# Patient Record
Sex: Female | Born: 1945
Health system: Southern US, Community
[De-identification: ages and names within clinical notes are randomized; demographics above are authoritative.]

## PROBLEM LIST (undated history)

## (undated) DIAGNOSIS — R51 Headache: Secondary | ICD-10-CM

## (undated) DIAGNOSIS — R011 Cardiac murmur, unspecified: Secondary | ICD-10-CM

## (undated) DIAGNOSIS — D649 Anemia, unspecified: Secondary | ICD-10-CM

## (undated) DIAGNOSIS — I2510 Atherosclerotic heart disease of native coronary artery without angina pectoris: Secondary | ICD-10-CM

## (undated) DIAGNOSIS — K900 Celiac disease: Secondary | ICD-10-CM

## (undated) DIAGNOSIS — K219 Gastro-esophageal reflux disease without esophagitis: Secondary | ICD-10-CM

## (undated) DIAGNOSIS — R0600 Dyspnea, unspecified: Secondary | ICD-10-CM

## (undated) DIAGNOSIS — G2581 Restless legs syndrome: Secondary | ICD-10-CM

## (undated) DIAGNOSIS — Z1371 Encounter for nonprocreative screening for genetic disease carrier status: Secondary | ICD-10-CM

## (undated) DIAGNOSIS — M1990 Unspecified osteoarthritis, unspecified site: Secondary | ICD-10-CM

## (undated) DIAGNOSIS — Q208 Other congenital malformations of cardiac chambers and connections: Secondary | ICD-10-CM

## (undated) DIAGNOSIS — I7300 Raynaud's syndrome without gangrene: Secondary | ICD-10-CM

## (undated) DIAGNOSIS — F419 Anxiety disorder, unspecified: Secondary | ICD-10-CM

## (undated) DIAGNOSIS — F32A Depression, unspecified: Secondary | ICD-10-CM

## (undated) DIAGNOSIS — E039 Hypothyroidism, unspecified: Secondary | ICD-10-CM

## (undated) DIAGNOSIS — R7303 Prediabetes: Secondary | ICD-10-CM

## (undated) DIAGNOSIS — R918 Other nonspecific abnormal finding of lung field: Secondary | ICD-10-CM

## (undated) DIAGNOSIS — I10 Essential (primary) hypertension: Secondary | ICD-10-CM

## (undated) DIAGNOSIS — I480 Paroxysmal atrial fibrillation: Secondary | ICD-10-CM

## (undated) DIAGNOSIS — F329 Major depressive disorder, single episode, unspecified: Secondary | ICD-10-CM

## (undated) DIAGNOSIS — M810 Age-related osteoporosis without current pathological fracture: Secondary | ICD-10-CM

## (undated) DIAGNOSIS — I5189 Other ill-defined heart diseases: Secondary | ICD-10-CM

## (undated) DIAGNOSIS — I5030 Unspecified diastolic (congestive) heart failure: Secondary | ICD-10-CM

## (undated) DIAGNOSIS — H409 Unspecified glaucoma: Secondary | ICD-10-CM

## (undated) DIAGNOSIS — I509 Heart failure, unspecified: Secondary | ICD-10-CM

## (undated) DIAGNOSIS — R519 Headache, unspecified: Secondary | ICD-10-CM

## (undated) DIAGNOSIS — J189 Pneumonia, unspecified organism: Secondary | ICD-10-CM

## (undated) DIAGNOSIS — K760 Fatty (change of) liver, not elsewhere classified: Secondary | ICD-10-CM

## (undated) DIAGNOSIS — Z974 Presence of external hearing-aid: Secondary | ICD-10-CM

## (undated) DIAGNOSIS — I639 Cerebral infarction, unspecified: Secondary | ICD-10-CM

## (undated) DIAGNOSIS — C50919 Malignant neoplasm of unspecified site of unspecified female breast: Secondary | ICD-10-CM

## (undated) HISTORY — PX: HAND SURGERY: SHX662

## (undated) HISTORY — DX: Paroxysmal atrial fibrillation: I48.0

## (undated) HISTORY — DX: Unspecified glaucoma: H40.9

## (undated) HISTORY — DX: Unspecified diastolic (congestive) heart failure: I50.30

## (undated) HISTORY — DX: Atherosclerotic heart disease of native coronary artery without angina pectoris: I25.10

## (undated) HISTORY — PX: APPENDECTOMY: SHX54

## (undated) HISTORY — DX: Cerebral infarction, unspecified: I63.9

## (undated) HISTORY — PX: ABDOMINAL HYSTERECTOMY: SHX81

## (undated) HISTORY — DX: Other nonspecific abnormal finding of lung field: R91.8

## (undated) HISTORY — PX: TONSILLECTOMY: SUR1361

## (undated) HISTORY — PX: CATARACT EXTRACTION: SUR2

## (undated) HISTORY — DX: Encounter for nonprocreative screening for genetic disease carrier status: Z13.71

## (undated) HISTORY — DX: Other ill-defined heart diseases: I51.89

## (undated) HISTORY — DX: Malignant neoplasm of unspecified site of unspecified female breast: C50.919

## (undated) HISTORY — PX: EYE SURGERY: SHX253

## (undated) HISTORY — DX: Celiac disease: K90.0

---

## 2004-06-16 ENCOUNTER — Ambulatory Visit: Payer: Self-pay | Admitting: Family Medicine

## 2004-07-27 ENCOUNTER — Ambulatory Visit: Payer: Self-pay | Admitting: Family Medicine

## 2004-08-16 ENCOUNTER — Ambulatory Visit: Payer: Self-pay | Admitting: Family Medicine

## 2006-01-12 ENCOUNTER — Ambulatory Visit: Payer: Self-pay | Admitting: Unknown Physician Specialty

## 2006-01-12 HISTORY — PX: UPPER GI ENDOSCOPY: SHX6162

## 2006-01-25 ENCOUNTER — Ambulatory Visit: Payer: Self-pay | Admitting: Family Medicine

## 2008-04-07 ENCOUNTER — Ambulatory Visit: Payer: Self-pay | Admitting: Family Medicine

## 2010-04-12 ENCOUNTER — Ambulatory Visit: Payer: Self-pay | Admitting: Family Medicine

## 2010-05-10 ENCOUNTER — Ambulatory Visit: Payer: Self-pay | Admitting: Family Medicine

## 2010-05-27 ENCOUNTER — Ambulatory Visit: Payer: Self-pay | Admitting: Family Medicine

## 2010-09-27 ENCOUNTER — Emergency Department: Payer: Self-pay | Admitting: Emergency Medicine

## 2010-10-26 ENCOUNTER — Ambulatory Visit: Payer: Self-pay

## 2011-05-10 ENCOUNTER — Ambulatory Visit: Payer: Self-pay | Admitting: Family Medicine

## 2011-12-01 ENCOUNTER — Ambulatory Visit: Payer: Self-pay | Admitting: Family Medicine

## 2011-12-14 ENCOUNTER — Ambulatory Visit: Payer: Self-pay | Admitting: Family Medicine

## 2011-12-14 LAB — HM PAP SMEAR: HM Pap smear: NORMAL

## 2012-04-16 ENCOUNTER — Ambulatory Visit: Payer: Self-pay | Admitting: Family Medicine

## 2012-04-22 ENCOUNTER — Ambulatory Visit: Payer: Self-pay | Admitting: Unknown Physician Specialty

## 2012-04-22 LAB — HM COLONOSCOPY

## 2012-04-23 LAB — PATHOLOGY REPORT

## 2012-06-07 ENCOUNTER — Ambulatory Visit: Payer: Self-pay | Admitting: Family Medicine

## 2012-07-08 ENCOUNTER — Ambulatory Visit: Payer: Self-pay

## 2012-07-27 ENCOUNTER — Ambulatory Visit: Payer: Self-pay

## 2012-10-14 ENCOUNTER — Ambulatory Visit: Payer: Self-pay | Admitting: Family Medicine

## 2012-10-14 LAB — HM DEXA SCAN

## 2012-12-18 ENCOUNTER — Ambulatory Visit: Payer: Self-pay | Admitting: Ophthalmology

## 2013-01-01 ENCOUNTER — Ambulatory Visit: Payer: Self-pay | Admitting: Ophthalmology

## 2013-02-05 ENCOUNTER — Ambulatory Visit: Payer: Self-pay | Admitting: Ophthalmology

## 2013-04-07 ENCOUNTER — Ambulatory Visit: Payer: Self-pay | Admitting: Family Medicine

## 2014-02-05 LAB — BASIC METABOLIC PANEL
BUN: 10 mg/dL (ref 4–21)
Creatinine: 0.6 mg/dL (ref 0.5–1.1)
Glucose: 111 mg/dL
Potassium: 4 mmol/L (ref 3.4–5.3)
Sodium: 142 mmol/L (ref 137–147)

## 2014-02-05 LAB — HEPATIC FUNCTION PANEL
ALT: 25 U/L (ref 7–35)
AST: 19 U/L (ref 13–35)
Alkaline Phosphatase: 58 U/L (ref 25–125)

## 2014-02-05 LAB — CBC AND DIFFERENTIAL
HCT: 41 % (ref 36–46)
Hemoglobin: 13.5 g/dL (ref 12.0–16.0)
Neutrophils Absolute: 3 /uL
Platelets: 301 10*3/uL (ref 150–399)
WBC: 6 10^3/mL

## 2014-02-05 LAB — TSH: TSH: 3.29 u[IU]/mL (ref 0.41–5.90)

## 2014-02-05 LAB — HEMOGLOBIN A1C: Hgb A1c MFr Bld: 5.6 % (ref 4.0–6.0)

## 2014-04-07 LAB — LIPID PANEL
Cholesterol: 201 mg/dL — AB (ref 0–200)
HDL: 46 mg/dL (ref 35–70)
LDL Cholesterol: 117 mg/dL
Triglycerides: 189 mg/dL — AB (ref 40–160)

## 2014-04-21 ENCOUNTER — Ambulatory Visit: Payer: Self-pay | Admitting: Family Medicine

## 2014-07-21 DIAGNOSIS — H35371 Puckering of macula, right eye: Secondary | ICD-10-CM | POA: Diagnosis not present

## 2014-07-21 DIAGNOSIS — H40003 Preglaucoma, unspecified, bilateral: Secondary | ICD-10-CM | POA: Diagnosis not present

## 2014-08-18 DIAGNOSIS — H35371 Puckering of macula, right eye: Secondary | ICD-10-CM | POA: Diagnosis not present

## 2014-09-07 ENCOUNTER — Ambulatory Visit
Admit: 2014-09-07 | Disposition: A | Discharge status: Home / Self Care | Payer: Self-pay | Attending: Ophthalmology | Admitting: Ophthalmology

## 2014-09-07 DIAGNOSIS — H35371 Puckering of macula, right eye: Secondary | ICD-10-CM | POA: Diagnosis not present

## 2014-09-07 DIAGNOSIS — Z01810 Encounter for preprocedural cardiovascular examination: Secondary | ICD-10-CM | POA: Diagnosis not present

## 2014-09-16 ENCOUNTER — Ambulatory Visit
Admit: 2014-09-16 | Disposition: A | Discharge status: Home / Self Care | Payer: Self-pay | Attending: Ophthalmology | Admitting: Ophthalmology

## 2014-09-16 DIAGNOSIS — H43311 Vitreous membranes and strands, right eye: Secondary | ICD-10-CM | POA: Diagnosis not present

## 2014-09-16 DIAGNOSIS — I7300 Raynaud's syndrome without gangrene: Secondary | ICD-10-CM | POA: Diagnosis not present

## 2014-09-16 DIAGNOSIS — M810 Age-related osteoporosis without current pathological fracture: Secondary | ICD-10-CM | POA: Diagnosis not present

## 2014-09-16 DIAGNOSIS — E039 Hypothyroidism, unspecified: Secondary | ICD-10-CM | POA: Diagnosis not present

## 2014-09-16 DIAGNOSIS — H9190 Unspecified hearing loss, unspecified ear: Secondary | ICD-10-CM | POA: Diagnosis not present

## 2014-09-16 DIAGNOSIS — Z79899 Other long term (current) drug therapy: Secondary | ICD-10-CM | POA: Diagnosis not present

## 2014-09-16 DIAGNOSIS — H35371 Puckering of macula, right eye: Secondary | ICD-10-CM | POA: Diagnosis not present

## 2014-09-16 DIAGNOSIS — G2581 Restless legs syndrome: Secondary | ICD-10-CM | POA: Diagnosis not present

## 2014-09-18 NOTE — Op Note (Signed)
PATIENT NAME:  Sandra [name], Sandra [name] MR#:  [id number] DATE OF BIRTH:  10/15/1945  DATE OF PROCEDURE:  02/05/2013  PREOPERATIVE DIAGNOSIS:  Cataract of the right eye.  POSTOPERATIVE DIAGNOSIS:  Cataract of the right eye.  PROCEDURE:  Phacoemulsification with posterior chamber intraocular lens implantation of the right eye.  LENS:  ZCB00 18.5-diopter posterior chamber intraocular lens.  ULTRASOUND TIME:  9% of 43 seconds.  CDE 4.0.   SURGEON:  Mali Tyeson Tanimoto, MD  ANESTHESIA:  Retrobulbar block of Xylocaine and Bupivacaine.  COMPLICATIONS:  None.  DESCRIPTION OF PROCEDURE:  The patient was identified in the holding room and transported to the operating room and placed in the supine position under the operating microscope.  The right eye was identified as the operative eye and a retrobulbar block was performed under intravenous sedation.  It was then prepped and draped in the usual sterile ophthalmic fashion.  A 1 millimeter clear-corneal paracentesis was made at the 1:30 position.  The anterior chamber was filled with Viscoat viscoelastic.  A 2.4 millimeter keratome was used to make a near-clear corneal incision at the 10:30 position.  A curvilinear capsulorrhexis was made with a cystotome and capsulorrhexis forceps.  Balanced salt solution was used to hydrodissect and hydrodelineate the nucleus.  Phacoemulsification was then used in stop and chop fashion to remove the lens nucleus and epinucleus.  The remaining cortex was then removed using the irrigation and aspiration handpiece.  Provisc was then placed into the capsular bag to distend it for lens placement.  A ZCB00 18.5-diopter lens was then injected into the capsular bag.  The remaining viscoelastic was aspirated.  Wounds were hydrated with balanced salt solution.  The anterior chamber was inflated to a physiologic pressure with balanced salt solution. 0.1 mL of cefuroxime 10 mg/mL were injected into the anterior chamber for a dose of 1 mg of  intracameral antibiotic at the completion of the case.  Mio stat was placed into the anterior chamber to constrict the pupil.  No wound leaks were noted.  Topical Vigamox drops and Maxitrol ointment were applied to the eye.  The eye was patched.  The patient was taken to the recovery room in stable condition without complications of anesthesia or surgery.  ____________________________ Wyonia Hough, MD crb:aw D: 02/05/2013 14:18:42 ET T: 02/05/2013 14:37:01 ET JOB#: [id number]  cc: Wyonia Hough, MD, <Dictator> Leandrew Koyanagi MD ELECTRONICALLY SIGNED 02/12/2013 10:58

## 2014-09-18 NOTE — Op Note (Signed)
PATIENT NAME:  Sandra [name], Sandra [name] MR#:  [id number] DATE OF BIRTH:  1946/04/22  DATE OF PROCEDURE:  01/01/2013  PREOPERATIVE DIAGNOSIS:  Cataract of the left eye.  POSTOPERATIVE DIAGNOSIS:  Cataract of the left eye.  PROCEDURE:  Phacoemulsification with posterior chamber intraocular lens implantation of the left eye.  LENS: ZCB00 18.5-diopter posterior chamber intraocular lens.  ULTRASOUND TIME:  11% of 44 seconds.  CDE 5.0.   SURGEON:  Mali Madgeline Rayo, MD  ANESTHESIA:  Retrobulbar block of Xylocaine and Bupivacaine.  COMPLICATIONS:  None.  DESCRIPTION OF PROCEDURE:  The patient was identified in the holding room and transported to the operating room and placed in the supine position under the operating microscope.  The left eye was identified as the operative eye and a retrobulbar block was performed under intravenous sedation.  It was then prepped and draped in the usual sterile ophthalmic fashion.  A 1 millimeter clear-corneal paracentesis was made at the 1:30 position.  The anterior chamber was filled with Viscoat viscoelastic.  A 2.4 millimeter keratome was used to make a near-clear corneal incision at the 10:30 position.  A curvilinear capsulorrhexis was made with a cystotome and capsulorrhexis forceps.  Balanced salt solution was used to hydrodissect and hydrodelineate the nucleus.  Phacoemulsification was then used in stop and chop fashion to remove the lens nucleus and epinucleus.  The remaining cortex was then removed using the irrigation and aspiration handpiece.  Provisc was then placed into the capsular bag to distend it for lens placement.  A ZCB00 18.5-diopter lens was then injected into the capsular bag.  The remaining viscoelastic was aspirated.  Wounds were hydrated with balanced salt solution.  The anterior chamber was inflated to a physiologic pressure with balanced salt solution. 0.1 mL of cefuroxime 10 mg/mL were injected into the anterior chamber for a dose of 1 mg of  intracameral antibiotic at the completion of the case.  Mio stat was placed into the anterior chamber to constrict the pupil.  No wound leaks were noted.  Topical Vigamox drops and Maxitrol ointment were applied to the eye.  The eye was patched.  The patient was taken to the recovery room in stable condition without complications of anesthesia or surgery.  ____________________________ Wyonia Hough, MD crb:aw D: 01/01/2013 12:06:00 ET T: 01/01/2013 12:13:19 ET JOB#: [id number]  cc: Wyonia Hough, MD, <Dictator> Leandrew Koyanagi MD ELECTRONICALLY SIGNED 01/15/2013 13:30

## 2014-09-27 NOTE — Op Note (Signed)
PATIENT NAME:  Sandra [name], Sandra [name] MR#:  [id number] DATE OF BIRTH:  02-13-1946  DATE OF PROCEDURE:  09/16/2014  DATE OF BIRTH: 1946-02-08.   PREPROCEDURE DIAGNOSIS: Epiretinal membrane.   POSTPROCEDURE DIAGNOSIS: Epiretinal membrane.    PROCEDURE: A 25-gauge pars plana vitrectomy with ICG membrane peeling, epiretinal membrane, and ILM peeling in the right eye.   ANESTHESIA: MAC with retrobulbar block.   COMPLICATIONS: None.   ESTIMATED BLOOD LOSS: Minimal.   SPECIMENS:   None.   PROCEDURE IN DETAIL:   The patient was evaluated in the clinic for an epiretinal membrane noticeably significant in the right eye. Risks, benefits, alternatives, and complications were discussed with the patient. The patient elected to proceed with pars plana vitrectomy with membrane peeling. On the day of surgery, the patient was greeted in the preoperative holding area. Any consents were reviewed. The right eye was marked. The patient was then brought into the operating room in supine position. Monitored anesthesia care was administered and 4 mL  of a retrobulbar block consisting of lidocaine plain, Marcaine plain, and Wydase was injected. The right eye was then prepped and draped in the usual sterile fashion. Three 25 gauge trocars were placed in the usual position. The infusion cannula was checked to make sure it was in the vitreous cavity before starting the infusion. A core vitrectomy was performed. The patient did indeed have a posterior vitreous detachment previously and so 360 degree trimming of the vitreous was performed to the periphery. Attention was then drawn to the macula. ICG was used to stain the macular surface.  ILM forceps and a macular flat lens were used to peel the epiretinal membrane and internal limiting membrane off of the fovea for an area of  1 disc diameter around the fovea. Attention was then drawn to the periphery; 360 scleral depressed examination was performed and there were no retinal breaks or  tears noted. The trocars were then removed. The superotemporal trocar was sutured with 7-0 Vicryl. The subconjunctival cefuroxime and dexamethasone was injected. The patient was then patched and shielded with Neo-Poly-Dex ointment and taken to the recovery area in stable condition.    ____________________________ Laban Emperor Oval Linsey, MD jdr:tr D: 09/16/2014 08:23:42 ET T: 09/16/2014 12:17:33 ET JOB#: [id number]  cc: Janett Billow D. Oval Linsey, MD, <Dictator> Alexia Freestone MD ELECTRONICALLY SIGNED 09/23/2014 10:17

## 2014-11-06 DIAGNOSIS — H35371 Puckering of macula, right eye: Secondary | ICD-10-CM | POA: Diagnosis not present

## 2014-12-14 ENCOUNTER — Other Ambulatory Visit: Payer: Self-pay

## 2014-12-14 NOTE — Telephone Encounter (Signed)
Fax from [facility]. Please review.=aa

## 2014-12-17 MED ORDER — ALPRAZOLAM 0.5 MG PO TABS: 0.5000 mg | ORAL_TABLET | Freq: Every evening | ORAL | Status: DC | PRN

## 2014-12-18 ENCOUNTER — Other Ambulatory Visit: Payer: Self-pay

## 2014-12-18 NOTE — Telephone Encounter (Signed)
Fax from [facility] for [facility] down, her last OV with [place] was in November 2015 and no appts have been scheduled. Please review. Thank you-aa

## 2014-12-18 NOTE — Telephone Encounter (Signed)
See other message-aa 

## 2014-12-21 ENCOUNTER — Other Ambulatory Visit: Payer: Self-pay | Admitting: Family Medicine

## 2014-12-28 DIAGNOSIS — H35371 Puckering of macula, right eye: Secondary | ICD-10-CM | POA: Diagnosis not present

## 2015-01-05 ENCOUNTER — Telehealth: Payer: Self-pay

## 2015-01-05 DIAGNOSIS — F419 Anxiety disorder, unspecified: Secondary | ICD-10-CM | POA: Insufficient documentation

## 2015-01-05 MED ORDER — ALPRAZOLAM 0.5 MG PO TABS: 0.5000 mg | ORAL_TABLET | Freq: Every evening | ORAL | Status: DC | PRN

## 2015-01-05 NOTE — Telephone Encounter (Signed)
Pt's husband advised to have Sandra [name] schedule an as soon as possible. Prescription called into [facility] mail.

## 2015-01-06 ENCOUNTER — Ambulatory Visit (INDEPENDENT_AMBULATORY_CARE_PROVIDER_SITE_OTHER): Payer: Medicare Other | Admitting: Family Medicine

## 2015-01-06 ENCOUNTER — Encounter: Payer: Self-pay | Admitting: Family Medicine

## 2015-01-06 VITALS — BP 138/62 | HR 72 | Temp 97.7°F | Resp 16 | Wt 125.0 lb

## 2015-01-06 DIAGNOSIS — K219 Gastro-esophageal reflux disease without esophagitis: Secondary | ICD-10-CM | POA: Insufficient documentation

## 2015-01-06 DIAGNOSIS — M810 Age-related osteoporosis without current pathological fracture: Secondary | ICD-10-CM | POA: Insufficient documentation

## 2015-01-06 DIAGNOSIS — M1990 Unspecified osteoarthritis, unspecified site: Secondary | ICD-10-CM | POA: Insufficient documentation

## 2015-01-06 DIAGNOSIS — F411 Generalized anxiety disorder: Secondary | ICD-10-CM | POA: Diagnosis not present

## 2015-01-06 DIAGNOSIS — C4491 Basal cell carcinoma of skin, unspecified: Secondary | ICD-10-CM | POA: Insufficient documentation

## 2015-01-06 DIAGNOSIS — F329 Major depressive disorder, single episode, unspecified: Secondary | ICD-10-CM | POA: Insufficient documentation

## 2015-01-06 DIAGNOSIS — H269 Unspecified cataract: Secondary | ICD-10-CM

## 2015-01-06 DIAGNOSIS — D649 Anemia, unspecified: Secondary | ICD-10-CM | POA: Insufficient documentation

## 2015-01-06 DIAGNOSIS — E559 Vitamin D deficiency, unspecified: Secondary | ICD-10-CM | POA: Insufficient documentation

## 2015-01-06 DIAGNOSIS — M159 Polyosteoarthritis, unspecified: Secondary | ICD-10-CM

## 2015-01-06 DIAGNOSIS — F419 Anxiety disorder, unspecified: Secondary | ICD-10-CM | POA: Diagnosis not present

## 2015-01-06 DIAGNOSIS — R7303 Prediabetes: Secondary | ICD-10-CM

## 2015-01-06 DIAGNOSIS — K635 Polyp of colon: Secondary | ICD-10-CM

## 2015-01-06 DIAGNOSIS — G2581 Restless legs syndrome: Secondary | ICD-10-CM | POA: Insufficient documentation

## 2015-01-06 DIAGNOSIS — D509 Iron deficiency anemia, unspecified: Secondary | ICD-10-CM | POA: Insufficient documentation

## 2015-01-06 DIAGNOSIS — I7300 Raynaud's syndrome without gangrene: Secondary | ICD-10-CM | POA: Insufficient documentation

## 2015-01-06 DIAGNOSIS — E039 Hypothyroidism, unspecified: Secondary | ICD-10-CM | POA: Insufficient documentation

## 2015-01-06 DIAGNOSIS — E785 Hyperlipidemia, unspecified: Secondary | ICD-10-CM

## 2015-01-06 DIAGNOSIS — K900 Celiac disease: Secondary | ICD-10-CM

## 2015-01-06 MED ORDER — ROPINIROLE HCL 1 MG PO TABS: ORAL_TABLET | ORAL | Status: DC

## 2015-01-06 MED ORDER — ALPRAZOLAM 0.5 MG PO TABS: 0.5000 mg | ORAL_TABLET | Freq: Every evening | ORAL | Status: DC | PRN

## 2015-01-06 NOTE — Progress Notes (Signed)
Patient ID: Sandra [name], female   DOB: November 20, 1945, 69 y.o.   MRN: [id number]    Subjective:  HPI Pt reports that she is here because she needs a refill on her Xanax. She is  She reports that she is taking this every night for RLS. She reports that this is about the only thing that helps. She does state" If there is anything that is not controlled that will work, ill take that". She reports that she is feeling well emotionally. In fact she reports that she may can wean off the Zoloft a little because her stressor has moved out of the house.   Prior to Admission medications   Medication Sig Start Date End Date Taking? Authorizing Provider  alendronate (FOSAMAX) 70 MG tablet  11/14/14  Yes Historical Provider, MD  ALPRAZolam (XANAX) 0.5 MG tablet Take 1 tablet (0.5 mg total) by mouth at bedtime as needed for anxiety. 01/05/15  Yes Richard Maceo Pro., MD  Calcium Carbonate (CALCIUM 600 PO) Take 2 tablets by mouth daily.   Yes Historical Provider, MD  cholecalciferol (VITAMIN D) 400 UNITS TABS tablet Take 400 Units by mouth.   Yes Historical Provider, MD  levothyroxine (SYNTHROID, LEVOTHROID) 100 MCG tablet Take 1 tablet by mouth  daily 12/21/14  Yes Richard Maceo Pro., MD  Multiple Vitamin (MULTIVITAMIN) tablet Take 1 tablet by mouth daily.   Yes Historical Provider, MD  sertraline (ZOLOFT) 100 MG tablet Take 1 tablet by mouth  every day 12/21/14  Yes Richard Maceo Pro., MD    Patient Active Problem List   Diagnosis Date Noted  . Colon polyps 01/06/2015  . BCC (basal cell carcinoma of skin) 01/06/2015  . Anemia 01/06/2015  . Hypothyroidism 01/06/2015  . Vitamin D deficiency 01/06/2015  . Hyperlipidemia 01/06/2015  . Depression, major 01/06/2015  . GAD (generalized anxiety disorder) 01/06/2015  . RLS (restless legs syndrome) 01/06/2015  . Cataract 01/06/2015  . Raynaud phenomenon 01/06/2015  . GERD (gastroesophageal reflux disease) 01/06/2015  . Celiac disease 01/06/2015  . OA  (osteoarthritis) 01/06/2015  . Osteoporosis 01/06/2015  . Pre-diabetes 01/06/2015  .   01/05/2015    History reviewed. No pertinent past medical history.  Social History   Social History  . Marital Status: Married    Spouse Name: N/A  . Number of Children: N/A  . Years of Education: N/A   Occupational History  . Not on file.   Social History Main Topics  . Smoking status: Never Smoker   . Smokeless tobacco: Not on file  . Alcohol Use: Yes     Comment: a glass of wine once in a while  . Drug Use: No  . Sexual Activity: Not on file   Other Topics Concern  . Not on file   Social History Narrative    Allergies  Allergen Reactions  . Benadryl [Diphenhydramine Hcl (Sleep)]   . Advil [Ibuprofen] Rash    CAN TAKE NAPROSYN  . Claritin [Loratadine] Rash    Review of Systems  Constitutional: Negative.   Eyes: Negative.   Respiratory: Negative.   Cardiovascular: Negative.   Gastrointestinal: Negative.   Genitourinary: Negative.   Musculoskeletal: Negative.   Skin: Negative.   Neurological: Positive for headaches (right now, but it is because she did not sleep well lastnight.).  Endo/Heme/Allergies: Negative.   Psychiatric/Behavioral: Negative.     Immunization History  Administered Date(s) Administered  . Pneumococcal Conjugate-13 02/05/2014  . Tdap 03/18/2008   Objective:  BP 138/62 mmHg  Pulse 72  Temp(Src) 97.7 F (36.5 C) (Oral)  Resp 16  Wt 125 lb (56.7 kg)  Physical Exam  Constitutional: She is oriented to person, place, and time and well-developed, well-nourished, and in no distress.  HENT:  Head: Normocephalic and atraumatic.  Left Ear: External ear normal.  Nose: Nose normal.  Eyes: Conjunctivae and EOM are normal. Pupils are equal, round, and reactive to light.  Neck: Normal range of motion. Neck supple.  Cardiovascular: Normal rate, regular rhythm, normal heart sounds and intact distal pulses.   Pulmonary/Chest: Effort normal and breath  sounds normal.  Neurological: She is alert and oriented to person, place, and time. She has normal reflexes. Gait normal. GCS score is 15.  Skin: Skin is warm and dry.  Psychiatric: Mood, memory, affect and judgment normal.    Lab Results  Component Value Date   WBC 6.0 02/05/2014   HGB 13.5 02/05/2014   HCT 41 02/05/2014   PLT 301 02/05/2014   CHOL 201* 04/07/2014   TRIG 189* 04/07/2014   HDL 46 04/07/2014   LDLCALC 117 04/07/2014   TSH 3.29 02/05/2014   HGBA1C 5.6 02/05/2014    CMP     Component Value Date/Time   NA 142 02/05/2014   K 4.0 02/05/2014   BUN 10 02/05/2014   CREATININE 0.6 02/05/2014   AST 19 02/05/2014   ALT 25 02/05/2014   ALKPHOS 58 02/05/2014    Assessment and Plan :   1. RLS (restless legs syndrome) Stop Xanax if Ropinirole works. - rOPINIRole (REQUIP) 1 MG tablet; 1-2 at bedtime  Dispense: 60 tablet; Refill: 12  2. GAD (generalized anxiety disorder)    Patient was seen and examined by Dr. [name], and noted scribed by Webb Laws, [place] MD [facility] 01/06/2015 2:49 PM

## 2015-01-08 ENCOUNTER — Telehealth: Payer: Self-pay

## 2015-01-08 NOTE — Telephone Encounter (Signed)
Pt states she was in 2 days ago and she was switched from Xanax to Ropinirole for RLS. She took it last night and had hard time sleeping. Her legs did fine but her mind would not shut off and she could not fall asleep and her hands are swollen today. Should she give it another try or just go back to Xanax? Please review. Dr. Marlan Palau patient-aa

## 2015-01-08 NOTE — Telephone Encounter (Signed)
Pt advised-aa 

## 2015-01-08 NOTE — Telephone Encounter (Signed)
The ropinirole does not cause insomnia but actually causes drowsiness, but can cause some edema.  See if she can try to continue through the weekend and if no improvement to call back on Monday.

## 2015-01-12 ENCOUNTER — Telehealth: Payer: Self-pay | Admitting: Family Medicine

## 2015-01-12 NOTE — Telephone Encounter (Signed)
Pt advised-aa 

## 2015-01-12 NOTE — Telephone Encounter (Signed)
Pt states started taking the Rx rOPINIRole (REQUIP) 1 MG on 01/07/2015 and she is not able to take this.  Pt is having side effects, she is having headaches, heart is racing and no energy.  Pt also states her legs feel stiff.  Pt states she is going to start back taking the Rx ALPRAZolam (XANAX) 0.5 MG.  CB#[phone] or [phone]

## 2015-01-12 NOTE — Telephone Encounter (Signed)
ok 

## 2015-01-12 NOTE — Telephone Encounter (Signed)
See below-aa 

## 2015-01-13 DIAGNOSIS — H40009 Preglaucoma, unspecified, unspecified eye: Secondary | ICD-10-CM | POA: Diagnosis not present

## 2015-01-21 DIAGNOSIS — H40003 Preglaucoma, unspecified, bilateral: Secondary | ICD-10-CM | POA: Diagnosis not present

## 2015-01-29 ENCOUNTER — Encounter: Payer: Self-pay | Admitting: Family Medicine

## 2015-01-29 ENCOUNTER — Ambulatory Visit (INDEPENDENT_AMBULATORY_CARE_PROVIDER_SITE_OTHER): Payer: Medicare Other | Admitting: Family Medicine

## 2015-01-29 VITALS — BP 132/80 | HR 68 | Temp 97.8°F | Resp 14 | Wt 126.2 lb

## 2015-01-29 DIAGNOSIS — R42 Dizziness and giddiness: Secondary | ICD-10-CM | POA: Diagnosis not present

## 2015-01-29 DIAGNOSIS — F439 Reaction to severe stress, unspecified: Secondary | ICD-10-CM

## 2015-01-29 DIAGNOSIS — Z658 Other specified problems related to psychosocial circumstances: Secondary | ICD-10-CM | POA: Diagnosis not present

## 2015-01-29 NOTE — Progress Notes (Signed)
Subjective:     Patient ID: Sandra [name], female   DOB: [birth year], 69 y.o.   MRN: [id number]  HPI  Chief Complaint  Patient presents with  . Dizziness    Patient comes in office today with concerns of dizziness since Monday 02/01/15. Patient describes dizziness and spinning of the environment. Associated symptoms include: nausea, neck pain, headache and decreased energy  States she woke up with vertigo 8/29 but it has steadily improved. Continues to have mild sx if she looks up.She wears bilateral hearing aids. Admits to a lot of stress regarding her husband's worsening COPD and continued smoking. She reports she does everything around the house (including a lot of lifting)  due to his respiratory disability.   Review of Systems  Constitutional:       "I don't feel sick"       Objective:   Physical Exam  Constitutional: She appears well-developed and well-nourished. No distress.  HENT:  Right Ear: Tympanic membrane normal.  Left Ear: Tympanic membrane normal.  Neck: Carotid bruit is not present.  Cardiovascular: Normal rate and regular rhythm.   No murmur heard. Pulmonary/Chest: Breath sounds normal.  Musculoskeletal: She exhibits no edema (of lower extremitires).  Left neck over SCM tender  Neurological: She is alert. Coordination (finger to nose and heel to toe ok. Romberg negative) normal.       Assessment:    1. Vertigo: ? Positional ? Stress equivalent  2. Situational stress    Plan:    May increase Xanax to 2-3 x day as needed. Consider ENT referral if vertigo recurrent.

## 2015-01-29 NOTE — Patient Instructions (Addendum)
Try increasing Xanax temporarily to 2-3 x day for your current stressful situation. If you vertigo returns consider ENT referral

## 2015-03-04 ENCOUNTER — Telehealth: Payer: Self-pay | Admitting: Family Medicine

## 2015-03-04 ENCOUNTER — Ambulatory Visit
Admission: RE | Admit: 2015-03-04 | Discharge: 2015-03-04 | Disposition: A | Discharge status: Home / Self Care | Payer: Medicare Other | Source: Ambulatory Visit | Attending: Family Medicine | Admitting: Family Medicine

## 2015-03-04 ENCOUNTER — Encounter: Payer: Self-pay | Admitting: Family Medicine

## 2015-03-04 ENCOUNTER — Ambulatory Visit (INDEPENDENT_AMBULATORY_CARE_PROVIDER_SITE_OTHER): Payer: Medicare Other | Admitting: Family Medicine

## 2015-03-04 VITALS — BP 132/80 | HR 62 | Temp 97.8°F | Resp 16 | Wt 128.0 lb

## 2015-03-04 DIAGNOSIS — M79641 Pain in right hand: Secondary | ICD-10-CM | POA: Diagnosis not present

## 2015-03-04 DIAGNOSIS — Z23 Encounter for immunization: Secondary | ICD-10-CM | POA: Diagnosis not present

## 2015-03-04 DIAGNOSIS — X58XXXA Exposure to other specified factors, initial encounter: Secondary | ICD-10-CM | POA: Diagnosis not present

## 2015-03-04 DIAGNOSIS — S62326A Displaced fracture of shaft of fifth metacarpal bone, right hand, initial encounter for closed fracture: Secondary | ICD-10-CM | POA: Diagnosis not present

## 2015-03-04 DIAGNOSIS — S6291XA Unspecified fracture of right wrist and hand, initial encounter for closed fracture: Secondary | ICD-10-CM

## 2015-03-04 NOTE — Progress Notes (Signed)
Patient ID: Sandra [name], female   DOB: May 05, 1946, 69 y.o.   MRN: [id number]    Subjective:  HPI Pt reports that she was working in the yard yesterday morning and she tripped and fell and braced herself with her right hand. She finished doing what she was doing in the yard and thought that her hand would have been bruise when she took her gloves off but it was not, later that night she noticed that it was swollen and hurt worse and the pain was radiating down her forearm. She has a bruise on the palm of her hand. She can not bend her pinky finger or straighten it out. She is right handed and finds it difficult to write.   Prior to Admission medications   Medication Sig Start Date End Date Taking? Authorizing Provider  alendronate (FOSAMAX) 70 MG tablet  11/14/14  Yes Historical Provider, MD  ALPRAZolam (XANAX) 0.5 MG tablet Take 1 tablet (0.5 mg total) by mouth at bedtime as needed for anxiety. 01/06/15  Yes Richard Maceo Pro., MD  Calcium Carbonate (CALCIUM 600 PO) Take 2 tablets by mouth daily.   Yes Historical Provider, MD  cholecalciferol (VITAMIN D) 400 UNITS TABS tablet Take 400 Units by mouth.   Yes Historical Provider, MD  levothyroxine (SYNTHROID, LEVOTHROID) 100 MCG tablet Take 1 tablet by mouth  daily 12/21/14  Yes Richard Maceo Pro., MD  sertraline (ZOLOFT) 100 MG tablet Take 1 tablet by mouth  every day 12/21/14  Yes Richard Maceo Pro., MD    Patient Active Problem List   Diagnosis Date Noted  . Colon polyps 01/06/2015  . BCC (basal cell carcinoma of skin) 01/06/2015  . Anemia 01/06/2015  . Hypothyroidism 01/06/2015  . Vitamin D deficiency 01/06/2015  . Hyperlipidemia 01/06/2015  . Depression, major ([place]) 01/06/2015  . GAD (generalized anxiety disorder) 01/06/2015  . RLS (restless legs syndrome) 01/06/2015  . Cataract 01/06/2015  . Raynaud phenomenon 01/06/2015  . GERD (gastroesophageal reflux disease) 01/06/2015  . Celiac disease 01/06/2015  . OA (osteoarthritis)  01/06/2015  . Osteoporosis 01/06/2015  . Pre-diabetes 01/06/2015  .   01/05/2015    Past Medical History  Diagnosis Date  . Celiac disease     Social History   Social History  . Marital Status: Married    Spouse Name: N/A  . Number of Children: N/A  . Years of Education: N/A   Occupational History  . Not on file.   Social History Main Topics  . Smoking status: Never Smoker   . Smokeless tobacco: Not on file  . Alcohol Use: Yes     Comment: a glass of wine once in a while  . Drug Use: No  . Sexual Activity: Not on file   Other Topics Concern  . Not on file   Social History Narrative    Allergies  Allergen Reactions  . Benadryl [Diphenhydramine Hcl (Sleep)]   . Advil [Ibuprofen] Rash    CAN TAKE NAPROSYN  . Claritin [Loratadine] Rash    Review of Systems  Constitutional: Negative.   HENT: Negative.   Eyes: Negative.   Respiratory: Negative.   Cardiovascular: Negative.   Gastrointestinal: Negative.   Genitourinary: Negative.   Musculoskeletal: Positive for joint pain.  Skin: Negative.   Neurological: Negative.   Endo/Heme/Allergies: Negative.   Psychiatric/Behavioral: Negative.     Immunization History  Administered Date(s) Administered  . Pneumococcal Conjugate-13 02/05/2014  . Tdap 03/18/2008   Objective:  BP 132/80 mmHg  Pulse 62  Temp(Src) 97.8 F (36.6 C) (Oral)  Resp 16  Wt 128 lb (58.06 kg)  Physical Exam  Constitutional: She is well-developed, well-nourished, and in no distress.  Cardiovascular: Normal rate, regular rhythm and normal heart sounds.   Pulmonary/Chest: Effort normal and breath sounds normal.  Abdominal: Soft.  Musculoskeletal: She exhibits edema and tenderness.  Neurovascular exam of the hand is normal. She has full range of motion of the fingers but the fourth and fifth metatarsals are very swollen and tender. No obvious crepitance.    Lab Results  Component Value Date   WBC 6.0 02/05/2014   HGB 13.5 02/05/2014    HCT 41 02/05/2014   PLT 301 02/05/2014   CHOL 201* 04/07/2014   TRIG 189* 04/07/2014   HDL 46 04/07/2014   LDLCALC 117 04/07/2014   TSH 3.29 02/05/2014   HGBA1C 5.6 02/05/2014    CMP     Component Value Date/Time   NA 142 02/05/2014   K 4.0 02/05/2014   BUN 10 02/05/2014   CREATININE 0.6 02/05/2014   AST 19 02/05/2014   ALT 25 02/05/2014   ALKPHOS 58 02/05/2014    Assessment and Plan :  Right hand pain/trauma/likely fracture of the fifth or fourth metacarpal Patient says the pain is not that bad. We'll x-ray and refer to orthopedics if it is fractured.  I have done the exam and reviewed the above chart and it is accurate to the best of my knowledge.  [name] MD [facility] 03/04/2015 11:07 AM

## 2015-03-05 DIAGNOSIS — S62356A Nondisplaced fracture of shaft of fifth metacarpal bone, right hand, initial encounter for closed fracture: Secondary | ICD-10-CM | POA: Diagnosis not present

## 2015-04-02 DIAGNOSIS — S62356A Nondisplaced fracture of shaft of fifth metacarpal bone, right hand, initial encounter for closed fracture: Secondary | ICD-10-CM | POA: Diagnosis not present

## 2015-04-13 ENCOUNTER — Ambulatory Visit (INDEPENDENT_AMBULATORY_CARE_PROVIDER_SITE_OTHER): Payer: Medicare Other | Admitting: Family Medicine

## 2015-04-13 ENCOUNTER — Encounter: Payer: Self-pay | Admitting: Family Medicine

## 2015-04-13 VITALS — BP 120/72 | HR 68 | Temp 98.5°F | Resp 16 | Ht 64.0 in | Wt 127.0 lb

## 2015-04-13 DIAGNOSIS — Z0000 Encounter for general adult medical examination without abnormal findings: Secondary | ICD-10-CM | POA: Diagnosis not present

## 2015-04-13 DIAGNOSIS — M810 Age-related osteoporosis without current pathological fracture: Secondary | ICD-10-CM

## 2015-04-13 DIAGNOSIS — Z1211 Encounter for screening for malignant neoplasm of colon: Secondary | ICD-10-CM | POA: Diagnosis not present

## 2015-04-13 NOTE — Progress Notes (Signed)
Patient ID: Sandra [name], female   DOB: 09/07/1945, 69 y.o.   MRN: [id number]       Patient: Sandra [name], Female    DOB: 04-20-1946, 69 y.o.   MRN: [id number] Visit Date: 04/13/2015  Today's Provider: [name], MD   Chief Complaint  Patient presents with  . Annual Exam   Subjective:    Annual wellness visit Sandra [name] is a 69 y.o. female. She feels well. She reports exercising daily. She reports she is sleeping well. Last: Mammogram- 04/21/2014. Normal.  Pap- 12/12/2011. Normal. History of hysterectomy.  EKG- 08/28/2014.  Colonoscopy- 04/22/2012. Repeat in 03/2015.  BMD- 10/14/2012. Normal. Repeat in 09/2014.  Tdap- 03/18/2008.  Prevnar- 02/05/2014.  Flu-03/04/2015.  -----------------------------------------------------------   Review of Systems  Constitutional: Negative.   HENT: Negative.   Eyes: Negative.   Respiratory: Negative.   Cardiovascular: Negative.   Gastrointestinal: Negative.   Endocrine: Negative.   Genitourinary: Negative.   Musculoskeletal: Negative.   Skin: Negative.   Allergic/Immunologic: Negative.   Neurological: Negative.   Hematological: Negative.   Psychiatric/Behavioral: Negative.     Social History   Social History  . Marital Status: Married    Spouse Name: N/A  . Number of Children: N/A  . Years of Education: N/A   Occupational History  . Not on file.   Social History Main Topics  . Smoking status: Never Smoker   . Smokeless tobacco: Not on file  . Alcohol Use: Yes     Comment: a glass of wine once in a while  . Drug Use: No  . Sexual Activity: Not on file   Other Topics Concern  . Not on file   Social History Narrative    Patient Active Problem List   Diagnosis Date Noted  . Colon polyps 01/06/2015  . BCC (basal cell carcinoma of skin) 01/06/2015  . Anemia 01/06/2015  . Hypothyroidism 01/06/2015  . Vitamin D deficiency 01/06/2015  . Hyperlipidemia 01/06/2015  . Depression, major ([place]) 01/06/2015    . GAD (generalized anxiety disorder) 01/06/2015  . RLS (restless legs syndrome) 01/06/2015  . Cataract 01/06/2015  . Raynaud phenomenon 01/06/2015  . GERD (gastroesophageal reflux disease) 01/06/2015  . Celiac disease 01/06/2015  . OA (osteoarthritis) 01/06/2015  . Osteoporosis 01/06/2015  . Pre-diabetes 01/06/2015  .   01/05/2015    Past Surgical History  Procedure Laterality Date  . Abdominal hysterectomy    . Appendectomy    . Cesarean section    . Tonsillectomy    . Cataract extraction Bilateral   . Hand surgery Left   . Upper gi endoscopy  01/12/06    hiatus hernia    Her family history includes Alcohol abuse in her father; Anemia in her mother; Breast cancer in her maternal grandmother and sister; CAD in her paternal grandfather; Cancer in her mother; Heart attack in her maternal grandfather; Other in her mother; Prostate cancer in her father; Stroke in her maternal grandmother.    Previous Medications   ALENDRONATE (FOSAMAX) 70 MG TABLET       ALPRAZOLAM (XANAX) 0.5 MG TABLET    Take 1 tablet (0.5 mg total) by mouth at bedtime as needed for anxiety.   CALCIUM CARBONATE (CALCIUM 600 PO)    Take 2 tablets by mouth daily.   CHOLECALCIFEROL (VITAMIN D) 400 UNITS TABS TABLET    Take 400 Units by mouth.   LEVOTHYROXINE (SYNTHROID, LEVOTHROID) 100 MCG TABLET    Take 1 tablet by mouth  daily  SERTRALINE (ZOLOFT) 100 MG TABLET    Take 1 tablet by mouth  every day    Patient Care Team: Jerrol Banana., MD as PCP - General (Family Medicine)     Objective:   Vitals: BP 120/72 mmHg  Pulse 68  Temp(Src) 98.5 F (36.9 C)  Resp 16  Ht 5' 4"  (1.626 m)  Wt 127 lb (57.607 kg)  BMI 21.79 kg/m2  Physical Exam  Constitutional: She is oriented to person, place, and time. She appears well-developed and well-nourished.  HENT:  Head: Normocephalic and atraumatic.  Right Ear: External ear normal.  Left Ear: External ear normal.  Nose: Nose normal.  Mouth/Throat:  Oropharynx is clear and moist.  Eyes: Conjunctivae and EOM are normal. Pupils are equal, round, and reactive to light.  Neck: Normal range of motion. Neck supple.  Cardiovascular: Normal rate, regular rhythm, normal heart sounds and intact distal pulses.   Pulmonary/Chest: Effort normal and breath sounds normal.  Abdominal: Soft. Bowel sounds are normal.  Genitourinary:  Pap was normal in 2013. Colonoscopy scheduled on today's visit.   Musculoskeletal: Normal range of motion.  Neurological: She is alert and oriented to person, place, and time. She has normal reflexes.  Skin: Skin is warm and dry.  Fair skin.  Psychiatric: She has a normal mood and affect. Her behavior is normal. Judgment and thought content normal.  Nursing note and vitals reviewed.   Activities of Daily Living In your present state of health, do you have any difficulty performing the following activities: 04/13/2015  Hearing? Y  Vision? Y  Difficulty concentrating or making decisions? Y  Walking or climbing stairs? N  Dressing or bathing? N  Doing errands, shopping? N    Fall Risk Assessment Fall Risk  04/13/2015  Falls in the past year? Yes  Number falls in past yr: 1  Injury with Fall? Yes  Follow up Falls prevention discussed     Depression Screen PHQ 2/9 Scores 04/13/2015  PHQ - 2 Score 0    Cognitive Testing - 6-CIT  Correct? Score   What year is it? yes 0 0 or 4  What month is it? yes 0 0 or 3  Memorize:    Sandra [name],  41,  High [address],  [place],      What time is it? (within 1 hour) yes 0 0 or 3  Count backwards from 20 yes 0 0, 2, or 4  Name the months of the year yes 0 0, 2, or 4  Repeat name & address above yes 0 0, 2, 4, 6, 8, or 10       TOTAL SCORE  0/28   Interpretation:  Normal  Normal (0-7) Abnormal (8-28)       Assessment & Plan:     Annual Wellness Visit  Reviewed patient's Family Medical History Reviewed and updated list of patient's medical providers Assessment of  cognitive impairment was done Assessed patient's functional ability Established a written schedule for health screening [place] Completed and Reviewed  Exercise Activities and Dietary recommendations Goals    None      Immunization History  Administered Date(s) Administered  . Influenza, High Dose Seasonal PF 03/04/2015  . Pneumococcal Conjugate-13 02/05/2014  . Tdap 03/18/2008    Health Maintenance  Topic Date Due  . Hepatitis C Screening  [birth year]  . MAMMOGRAM  03/21/1995  . ZOSTAVAX  03/20/2005  . PNA vac Low Risk Adult (2 of 2 - PPSV23) 02/06/2015  .  INFLUENZA VACCINE  12/28/2015  . TETANUS/TDAP  03/18/2018  . COLONOSCOPY  04/22/2022  . DEXA SCAN  Completed      Discussed health benefits of physical activity, and encouraged her to engage in regular exercise appropriate for her age and condition.    ------------------------------------------------------------------------------------------------------------

## 2015-04-15 DIAGNOSIS — Z86010 Personal history of colonic polyps: Secondary | ICD-10-CM | POA: Diagnosis not present

## 2015-04-15 DIAGNOSIS — K900 Celiac disease: Secondary | ICD-10-CM | POA: Diagnosis not present

## 2015-04-15 DIAGNOSIS — K219 Gastro-esophageal reflux disease without esophagitis: Secondary | ICD-10-CM | POA: Diagnosis not present

## 2015-04-30 ENCOUNTER — Other Ambulatory Visit: Payer: Self-pay | Admitting: Family Medicine

## 2015-04-30 DIAGNOSIS — Z1231 Encounter for screening mammogram for malignant neoplasm of breast: Secondary | ICD-10-CM

## 2015-04-30 DIAGNOSIS — S62356D Nondisplaced fracture of shaft of fifth metacarpal bone, right hand, subsequent encounter for fracture with routine healing: Secondary | ICD-10-CM | POA: Diagnosis not present

## 2015-05-03 ENCOUNTER — Ambulatory Visit
Admission: RE | Admit: 2015-05-03 | Discharge: 2015-05-03 | Disposition: A | Discharge status: Home / Self Care | Payer: Medicare Other | Source: Ambulatory Visit | Attending: Family Medicine | Admitting: Family Medicine

## 2015-05-03 ENCOUNTER — Other Ambulatory Visit: Payer: Self-pay | Admitting: Family Medicine

## 2015-05-03 DIAGNOSIS — Z13820 Encounter for screening for osteoporosis: Secondary | ICD-10-CM | POA: Insufficient documentation

## 2015-05-03 DIAGNOSIS — Z1231 Encounter for screening mammogram for malignant neoplasm of breast: Secondary | ICD-10-CM | POA: Insufficient documentation

## 2015-05-03 DIAGNOSIS — M810 Age-related osteoporosis without current pathological fracture: Secondary | ICD-10-CM

## 2015-05-04 ENCOUNTER — Other Ambulatory Visit: Payer: Self-pay | Admitting: Family Medicine

## 2015-05-18 ENCOUNTER — Encounter: Payer: Self-pay | Admitting: *Deleted

## 2015-05-19 ENCOUNTER — Ambulatory Visit: Payer: Medicare Other | Admitting: Anesthesiology

## 2015-05-19 ENCOUNTER — Ambulatory Visit
Admission: RE | Admit: 2015-05-19 | Discharge: 2015-05-19 | Disposition: A | Discharge status: Home / Self Care | Payer: Medicare Other | Source: Ambulatory Visit | Attending: Unknown Physician Specialty | Admitting: Unknown Physician Specialty

## 2015-05-19 ENCOUNTER — Encounter
Admission: RE | Disposition: A | Discharge status: Home / Self Care | Payer: Self-pay | Source: Ambulatory Visit | Attending: Unknown Physician Specialty

## 2015-05-19 DIAGNOSIS — D124 Benign neoplasm of descending colon: Secondary | ICD-10-CM | POA: Insufficient documentation

## 2015-05-19 DIAGNOSIS — M1990 Unspecified osteoarthritis, unspecified site: Secondary | ICD-10-CM | POA: Insufficient documentation

## 2015-05-19 DIAGNOSIS — K649 Unspecified hemorrhoids: Secondary | ICD-10-CM | POA: Diagnosis not present

## 2015-05-19 DIAGNOSIS — Z1211 Encounter for screening for malignant neoplasm of colon: Secondary | ICD-10-CM | POA: Diagnosis not present

## 2015-05-19 DIAGNOSIS — D123 Benign neoplasm of transverse colon: Secondary | ICD-10-CM | POA: Diagnosis not present

## 2015-05-19 DIAGNOSIS — G2581 Restless legs syndrome: Secondary | ICD-10-CM | POA: Diagnosis not present

## 2015-05-19 DIAGNOSIS — F329 Major depressive disorder, single episode, unspecified: Secondary | ICD-10-CM | POA: Diagnosis not present

## 2015-05-19 DIAGNOSIS — K219 Gastro-esophageal reflux disease without esophagitis: Secondary | ICD-10-CM | POA: Diagnosis not present

## 2015-05-19 DIAGNOSIS — F419 Anxiety disorder, unspecified: Secondary | ICD-10-CM | POA: Diagnosis not present

## 2015-05-19 DIAGNOSIS — K900 Celiac disease: Secondary | ICD-10-CM | POA: Diagnosis not present

## 2015-05-19 DIAGNOSIS — Z79899 Other long term (current) drug therapy: Secondary | ICD-10-CM | POA: Insufficient documentation

## 2015-05-19 DIAGNOSIS — I7300 Raynaud's syndrome without gangrene: Secondary | ICD-10-CM | POA: Diagnosis not present

## 2015-05-19 DIAGNOSIS — K640 First degree hemorrhoids: Secondary | ICD-10-CM | POA: Insufficient documentation

## 2015-05-19 DIAGNOSIS — Z86010 Personal history of colonic polyps: Secondary | ICD-10-CM | POA: Diagnosis not present

## 2015-05-19 DIAGNOSIS — E039 Hypothyroidism, unspecified: Secondary | ICD-10-CM | POA: Diagnosis not present

## 2015-05-19 DIAGNOSIS — I739 Peripheral vascular disease, unspecified: Secondary | ICD-10-CM | POA: Insufficient documentation

## 2015-05-19 DIAGNOSIS — K635 Polyp of colon: Secondary | ICD-10-CM | POA: Diagnosis not present

## 2015-05-19 HISTORY — DX: Restless legs syndrome: G25.81

## 2015-05-19 HISTORY — DX: Anemia, unspecified: D64.9

## 2015-05-19 HISTORY — DX: Unspecified osteoarthritis, unspecified site: M19.90

## 2015-05-19 HISTORY — PX: COLONOSCOPY WITH PROPOFOL: SHX5780

## 2015-05-19 HISTORY — DX: Major depressive disorder, single episode, unspecified: F32.9

## 2015-05-19 HISTORY — DX: Hypothyroidism, unspecified: E03.9

## 2015-05-19 HISTORY — DX: Anxiety disorder, unspecified: F41.9

## 2015-05-19 HISTORY — DX: Pneumonia, unspecified organism: J18.9

## 2015-05-19 HISTORY — DX: Depression, unspecified: F32.A

## 2015-05-19 HISTORY — DX: Raynaud's syndrome without gangrene: I73.00

## 2015-05-19 HISTORY — DX: Gastro-esophageal reflux disease without esophagitis: K21.9

## 2015-05-19 SURGERY — COLONOSCOPY WITH PROPOFOL
Anesthesia: General

## 2015-05-19 MED ORDER — MIDAZOLAM HCL 2 MG/2ML IJ SOLN
INTRAMUSCULAR | Status: DC | PRN
  Administered 2015-05-19: 1 mg via INTRAVENOUS

## 2015-05-19 MED ORDER — SODIUM CHLORIDE 0.9 % IV SOLN: INTRAVENOUS | Status: DC

## 2015-05-19 MED ORDER — SODIUM CHLORIDE 0.9 % IV SOLN
INTRAVENOUS | Status: DC
  Administered 2015-05-19: 1000 mL via INTRAVENOUS

## 2015-05-19 MED ORDER — PROPOFOL 10 MG/ML IV BOLUS
INTRAVENOUS | Status: DC | PRN
Start: 2015-05-19 — End: 2015-05-19
  Administered 2015-05-19 (×2): 10 mg via INTRAVENOUS
  Administered 2015-05-19: 20 mg via INTRAVENOUS
  Administered 2015-05-19 (×4): 10 mg via INTRAVENOUS
  Administered 2015-05-19: 20 mg via INTRAVENOUS
  Administered 2015-05-19: 10 mg via INTRAVENOUS

## 2015-05-19 NOTE — Anesthesia Preprocedure Evaluation (Signed)
Anesthesia Evaluation  Patient identified by MRN, date of birth, ID band Patient awake    Reviewed: Allergy & Precautions, H&P , NPO status , Patient's Chart, lab work & pertinent test results, reviewed documented beta blocker date and time   Airway Mallampati: II  TM Distance: >3 FB Neck ROM: full    Dental no notable dental hx.    Pulmonary neg pulmonary ROS, pneumonia,    Pulmonary exam normal breath sounds clear to auscultation       Cardiovascular Exercise Tolerance: Good + Peripheral Vascular Disease  negative cardio ROS   Rhythm:regular Rate:Normal     Neuro/Psych PSYCHIATRIC DISORDERS negative neurological ROS  negative psych ROS   GI/Hepatic negative GI ROS, Neg liver ROS, GERD  ,  Endo/Other  negative endocrine ROSHypothyroidism   Renal/GU negative Renal ROS  negative genitourinary   Musculoskeletal   Abdominal   Peds  Hematology negative hematology ROS (+) anemia ,   Anesthesia Other Findings   Reproductive/Obstetrics negative OB ROS                             Anesthesia Physical Anesthesia Plan  ASA: III  Anesthesia Plan: General   Post-op Pain Management:    Induction:   Airway Management Planned:   Additional Equipment:   Intra-op Plan:   Post-operative Plan:   Informed Consent: I have reviewed the patients History and Physical, chart, labs and discussed the procedure including the risks, benefits and alternatives for the proposed anesthesia with the patient or authorized representative who has indicated his/her understanding and acceptance.   Dental Advisory Given  Plan Discussed with: CRNA  Anesthesia Plan Comments:         Anesthesia Quick Evaluation

## 2015-05-19 NOTE — Transfer of Care (Signed)
Immediate Anesthesia Transfer of Care Note  Patient: Sandra [name]  Procedure(s) Performed: Procedure(s): COLONOSCOPY WITH PROPOFOL (N/A)  Patient Location: PACU and Endoscopy Unit  Anesthesia Type:General  Level of Consciousness: sedated  Airway & Oxygen Therapy: Patient Spontanous Breathing and Patient connected to nasal cannula oxygen  Post-op Assessment: Report given to RN and Post -op Vital signs reviewed and stable  Post vital signs: stable  Last Vitals:  Filed Vitals:   05/19/15 1049 05/19/15 1155  BP: 136/85 142/79  Pulse: 69 68  Temp: 36.7 C 36.8 C  Resp: 17     Complications: No apparent anesthesia complications

## 2015-05-19 NOTE — Anesthesia Postprocedure Evaluation (Signed)
Anesthesia Post Note  Patient: Sandra [name]  Procedure(s) Performed: Procedure(s) (LRB): COLONOSCOPY WITH PROPOFOL (N/A)  Patient location during evaluation: PACU Anesthesia Type: General Level of consciousness: awake and alert Pain management: pain level controlled Vital Signs Assessment: post-procedure vital signs reviewed and stable Respiratory status: spontaneous breathing, nonlabored ventilation, respiratory function stable and patient connected to nasal cannula oxygen Cardiovascular status: blood pressure returned to baseline and stable Postop Assessment: no signs of nausea or vomiting Anesthetic complications: no    Last Vitals:  Filed Vitals:   05/19/15 1049 05/19/15 1155  BP: 136/85 142/79  Pulse: 69 68  Temp: 36.7 C 36.8 C  Resp: 17     Last Pain: There were no vitals filed for this visit.               Molli Barrows

## 2015-05-19 NOTE — H&P (Signed)
Primary Care Physician:  [name], MD Primary Gastroenterologist:  Dr. Vira Agar  Pre-Procedure History & Physical: HPI:  Sandra [name] is a 69 y.o. female is here for an colonoscopy.   Past Medical History  Diagnosis Date  . Celiac disease   . Anemia   . Anxiety   . GERD (gastroesophageal reflux disease)   . Arthritis   . Raynaud's disease   . RLS (restless legs syndrome)   . Pneumonia   . Hypothyroidism   . Depression     Past Surgical History  Procedure Laterality Date  . Abdominal hysterectomy    . Appendectomy    . Cesarean section    . Tonsillectomy    . Cataract extraction Bilateral   . Hand surgery Left   . Upper gi endoscopy  01/12/06    hiatus hernia  . Eye surgery      Prior to Admission medications   Medication Sig Start Date End Date Taking? Authorizing Provider  alendronate (FOSAMAX) 70 MG tablet Take 1 tablet by mouth once a week 05/04/15   Jerrol Banana., MD  ALPRAZolam Duanne Moron) 0.5 MG tablet Take 1 tablet (0.5 mg total) by mouth at bedtime as needed for anxiety. 01/06/15   Richard Maceo Pro., MD  Calcium Carbonate (CALCIUM 600 PO) Take 2 tablets by mouth daily.    Historical Provider, MD  cholecalciferol (VITAMIN D) 400 UNITS TABS tablet Take 400 Units by mouth.    Historical Provider, MD  levothyroxine (SYNTHROID, LEVOTHROID) 100 MCG tablet Take 1 tablet by mouth  daily 12/21/14   Jerrol Banana., MD  sertraline (ZOLOFT) 100 MG tablet Take 1 tablet by mouth  every day 12/21/14   Jerrol Banana., MD    Allergies as of 04/29/2015 - Review Complete 04/13/2015  Allergen Reaction Noted  . Benadryl [diphenhydramine hcl (sleep)]  01/06/2015  . Advil [ibuprofen] Rash 01/06/2015  . Claritin [loratadine] Rash 01/06/2015    Family History  Problem Relation Age of Onset  . Cancer Mother   . Anemia Mother   . Other Mother     lymphosarcoma  . Alcohol abuse Father   . Prostate cancer Father   . Breast cancer Sister   . Breast  cancer Maternal Grandmother   . Stroke Maternal Grandmother   . Heart attack Maternal Grandfather   . CAD Paternal Grandfather     Social History   Social History  . Marital Status: Married    Spouse Name: N/A  . Number of Children: N/A  . Years of Education: N/A   Occupational History  . Not on file.   Social History Main Topics  . Smoking status: Never Smoker   . Smokeless tobacco: Not on file  . Alcohol Use: Yes     Comment: a glass of wine once in a while  . Drug Use: No  . Sexual Activity: Not on file   Other Topics Concern  . Not on file   Social History Narrative    Review of Systems: See HPI, otherwise negative ROS  Physical Exam: BP 136/85 mmHg  Pulse 69  Temp(Src) 98 F (36.7 C) (Oral)  Resp 17  Ht 5' 3"  (1.6 m)  Wt 54.885 kg (121 lb)  BMI 21.44 kg/m2  SpO2 98% General:   Alert,  pleasant and cooperative in NAD Head:  Normocephalic and atraumatic. Neck:  Supple; no masses or thyromegaly. Lungs:  Clear throughout to auscultation.    Heart:  Regular  rate and rhythm. Abdomen:  Soft, nontender and nondistended. Normal bowel sounds, without guarding, and without rebound.   Neurologic:  Alert and  oriented x4;  grossly normal neurologically.  Impression/Plan: Sandra [name] is here for an colonoscopy to be performed for [facility] colon polyps  Risks, benefits, limitations, and alternatives regarding  colonoscopy have been reviewed with the patient.  Questions have been answered.  All parties agreeable.   Gaylyn Cheers, MD  05/19/2015, 11:15 AM

## 2015-05-19 NOTE — Op Note (Signed)
[facility] Gastroenterology Patient Name: Sandra [name] Procedure Date: 05/19/2015 11:19 AM MRN: [id number] Account #: [id number] Date of Birth: 25-Apr-1946 Admit Type: Outpatient Age: 69 Room: [facility] ENDO ROOM 4 Gender: Female Note Status: Finalized Procedure:         Colonoscopy Indications:       High risk colon cancer surveillance: Personal history of                     colonic polyps Providers:         Manya Silvas, MD Referring MD:      Janine Ores. Rosanna Randy, MD (Referring MD) Medicines:         Propofol per Anesthesia Complications:     No immediate complications. Procedure:         Pre-Anesthesia Assessment:                    - After reviewing the risks and benefits, the patient was                     deemed in satisfactory condition to undergo the procedure.                    After obtaining informed consent, the colonoscope was                     passed under direct vision. Throughout the procedure, the                     patient's blood pressure, pulse, and oxygen saturations                     were monitored continuously. The Colonoscope was                     introduced through the anus and advanced to the the cecum,                     identified by appendiceal orifice and ileocecal valve. The                     colonoscopy was performed without difficulty. The patient                     tolerated the procedure well. The quality of the bowel                     preparation was good. Findings:      A small polyp was found in the transverse colon. The polyp was sessile.       The polyp was removed with a hot snare. Resection and retrieval were       complete.      A diminutive polyp was found in the transverse colon. The polyp was       sessile. The polyp was removed with a jumbo cold forceps. Resection and       retrieval were complete.      A diminutive polyp was found in the descending colon. The polyp was       sessile. The polyp was  removed with a jumbo cold forceps. Resection and       retrieval were complete.      A small polyp was found in the distal descending colon. The polyp was  sessile. The polyp was removed with a hot snare. Resection and retrieval       were complete.      Internal hemorrhoids were found during endoscopy. The hemorrhoids were       small and Grade I (internal hemorrhoids that do not prolapse). Impression:        - One small polyp in the transverse colon. Resected and                     retrieved.                    - One diminutive polyp in the transverse colon. Resected                     and retrieved.                    - One diminutive polyp in the descending colon. Resected                     and retrieved.                    - One small polyp in the distal descending colon. Resected                     and retrieved.                    - Internal hemorrhoids. Recommendation:    - Await pathology results. Manya Silvas, MD 05/19/2015 11:57:14 AM This report has been signed electronically. Number of Addenda: 0 Note Initiated On: 05/19/2015 11:19 AM Scope Withdrawal Time: 0 hours 13 minutes 55 seconds  Total Procedure Duration: 0 hours 28 minutes 37 seconds       [facility]

## 2015-05-20 ENCOUNTER — Encounter: Payer: Self-pay | Admitting: Unknown Physician Specialty

## 2015-05-20 LAB — SURGICAL PATHOLOGY

## 2015-06-28 DIAGNOSIS — H35371 Puckering of macula, right eye: Secondary | ICD-10-CM | POA: Diagnosis not present

## 2015-07-20 DIAGNOSIS — H40003 Preglaucoma, unspecified, bilateral: Secondary | ICD-10-CM | POA: Diagnosis not present

## 2015-07-28 ENCOUNTER — Ambulatory Visit (INDEPENDENT_AMBULATORY_CARE_PROVIDER_SITE_OTHER): Payer: Medicare Other | Admitting: Family Medicine

## 2015-07-28 ENCOUNTER — Encounter: Payer: Self-pay | Admitting: Family Medicine

## 2015-07-28 DIAGNOSIS — J324 Chronic pansinusitis: Secondary | ICD-10-CM | POA: Diagnosis not present

## 2015-07-28 MED ORDER — AMOXICILLIN-POT CLAVULANATE 875-125 MG PO TABS: 1.0000 | ORAL_TABLET | Freq: Two times a day (BID) | ORAL | Status: DC

## 2015-07-28 NOTE — Progress Notes (Signed)
Patient ID: Sandra [name], female   DOB: [birth year], 70 y.o.   MRN: [id number]    Subjective:  HPI Pt is here for a possible sinus infection. She reports that it started about 2 months ago. She has been keeping it controlled with nasal saline. She reports that she started having headaches, facial pain, teeth pain, pain over her eyes. She reports sinus tenderness when she pushes in them. She reports that she is not really congested and does not feel bad. She does have post nasal drainage and sneezing.    Prior to Admission medications   Medication Sig Start Date End Date Taking? Authorizing Provider  alendronate (FOSAMAX) 70 MG tablet Take 1 tablet by mouth once a week 05/04/15  Yes Casey Maxfield Maceo Pro., MD  ALPRAZolam Duanne Moron) 0.5 MG tablet Take 1 tablet (0.5 mg total) by mouth at bedtime as needed for anxiety. 01/06/15  Yes Roxsana Riding Maceo Pro., MD  Calcium Carbonate (CALCIUM 600 PO) Take 2 tablets by mouth daily.   Yes Historical Provider, MD  cholecalciferol (VITAMIN D) 400 UNITS TABS tablet Take 400 Units by mouth.   Yes Historical Provider, MD  levothyroxine (SYNTHROID, LEVOTHROID) 100 MCG tablet Take 1 tablet by mouth  daily 12/21/14  Yes [name] Maceo Pro., MD  MULTIPLE VITAMIN PO Take by mouth.   Yes Historical Provider, MD  sertraline (ZOLOFT) 100 MG tablet Take 1 tablet by mouth  every day 12/21/14  Yes Annick Dimaio Maceo Pro., MD    Patient Active Problem List   Diagnosis Date Noted  . Colon polyps 01/06/2015  . BCC (basal cell carcinoma of skin) 01/06/2015  . Anemia 01/06/2015  . Hypothyroidism 01/06/2015  . Vitamin D deficiency 01/06/2015  . Hyperlipidemia 01/06/2015  . Depression, major ([place]) 01/06/2015  . GAD (generalized anxiety disorder) 01/06/2015  . RLS (restless legs syndrome) 01/06/2015  . Cataract 01/06/2015  . Raynaud phenomenon 01/06/2015  . GERD (gastroesophageal reflux disease) 01/06/2015  . Celiac disease 01/06/2015  . OA (osteoarthritis) 01/06/2015  .  Osteoporosis 01/06/2015  . Pre-diabetes 01/06/2015  . Avitaminosis D 01/06/2015  .   01/05/2015  . Anxiety disorder 01/05/2015    Past Medical History  Diagnosis Date  . Celiac disease   . Anemia   . Anxiety   . GERD (gastroesophageal reflux disease)   . Arthritis   . Raynaud's disease   . RLS (restless legs syndrome)   . Pneumonia   . Hypothyroidism   . Depression     Social History   Social History  . Marital Status: Married    Spouse Name: N/A  . Number of Children: N/A  . Years of Education: N/A   Occupational History  . Not on file.   Social History Main Topics  . Smoking status: Never Smoker   . Smokeless tobacco: Not on file  . Alcohol Use: Yes     Comment: a glass of wine once in a while  . Drug Use: No  . Sexual Activity: Not on file   Other Topics Concern  . Not on file   Social History Narrative    Allergies  Allergen Reactions  . Benadryl [Diphenhydramine Hcl (Sleep)]   . Advil [Ibuprofen] Rash    CAN TAKE NAPROSYN  . Claritin [Loratadine] Rash    Review of Systems  Constitutional: Negative.   HENT: Positive for congestion.   Eyes: Negative.   Respiratory: Positive for cough (occasionally).   Cardiovascular: Negative.   Gastrointestinal: Negative.   Genitourinary: Negative.  Musculoskeletal: Negative.   Skin: Negative.   Neurological: Positive for headaches.  Endo/Heme/Allergies: Negative.   Psychiatric/Behavioral: Negative.     Immunization History  Administered Date(s) Administered  . Influenza, High Dose Seasonal PF 03/04/2015  . Pneumococcal Conjugate-13 02/05/2014  . Tdap 03/18/2008   Objective:  There were no vitals taken for this visit.  Physical Exam  Constitutional: She is oriented to person, place, and time and well-developed, well-nourished, and in no distress.  HENT:  Head: Normocephalic and atraumatic.  Right Ear: External ear normal.  Left Ear: External ear normal.  Nose: Nose normal.  Eyes: Conjunctivae  are normal.  Neck: Neck supple. No thyromegaly present.  Cardiovascular: Normal rate, regular rhythm and normal heart sounds.   Pulmonary/Chest: Effort normal and breath sounds normal.  Abdominal: Soft.  Lymphadenopathy:    She has no cervical adenopathy.  Neurological: She is alert and oriented to person, place, and time.  Skin: Skin is warm and dry.  Psychiatric: Mood, memory, affect and judgment normal.    Lab Results  Component Value Date   WBC 6.0 02/05/2014   HGB 13.5 02/05/2014   HCT 41 02/05/2014   PLT 301 02/05/2014   CHOL 201* 04/07/2014   TRIG 189* 04/07/2014   HDL 46 04/07/2014   LDLCALC 117 04/07/2014   TSH 3.29 02/05/2014   HGBA1C 5.6 02/05/2014    CMP     Component Value Date/Time   NA 142 02/05/2014   K 4.0 02/05/2014   BUN 10 02/05/2014   CREATININE 0.6 02/05/2014   AST 19 02/05/2014   ALT 25 02/05/2014   ALKPHOS 58 02/05/2014    Assessment and Plan :  1. Chronic pansinusitis ENT referral as his next step for this patient if this does not resolve. - amoxicillin-clavulanate (AUGMENTIN) 875-125 MG tablet; Take 1 tablet by mouth 2 (two) times daily.  Dispense: 28 tablet; Refill: 0 2. Chronic anxiety I have done the exam and reviewed the above chart and it is accurate to the best of my knowledge.  [name] MD [facility] 07/28/2015 2:04 PM

## 2015-08-27 ENCOUNTER — Ambulatory Visit
Admission: RE | Admit: 2015-08-27 | Discharge: 2015-08-27 | Disposition: A | Discharge status: Home / Self Care | Payer: Medicare Other | Source: Ambulatory Visit | Attending: Family Medicine | Admitting: Family Medicine

## 2015-08-27 ENCOUNTER — Ambulatory Visit (INDEPENDENT_AMBULATORY_CARE_PROVIDER_SITE_OTHER): Payer: Medicare Other | Admitting: Family Medicine

## 2015-08-27 ENCOUNTER — Encounter: Payer: Self-pay | Admitting: Family Medicine

## 2015-08-27 VITALS — BP 156/84 | HR 72 | Temp 98.2°F | Resp 16 | Wt 128.0 lb

## 2015-08-27 DIAGNOSIS — K900 Celiac disease: Secondary | ICD-10-CM | POA: Diagnosis not present

## 2015-08-27 DIAGNOSIS — R1010 Upper abdominal pain, unspecified: Secondary | ICD-10-CM | POA: Insufficient documentation

## 2015-08-27 DIAGNOSIS — R140 Abdominal distension (gaseous): Secondary | ICD-10-CM

## 2015-08-27 DIAGNOSIS — K219 Gastro-esophageal reflux disease without esophagitis: Secondary | ICD-10-CM

## 2015-08-27 NOTE — Progress Notes (Signed)
Patient ID: Sandra [name], female   DOB: 23-Oct-1945, 70 y.o.   MRN: [id number]       Patient: Sandra [name] Female    DOB: 30-Sep-1945   70 y.o.   MRN: [id number] Visit Date: 08/27/2015  Today's Provider: Vernie Murders, PA   Chief Complaint  Patient presents with  . Abdominal Pain   Subjective:    HPI  Patient has had issue with abdominal cramping pain for the past 2 days. Pain is located under ribs and in the whole generalized area of the abdomen. Pain gets intense-sharp where she has to hold her breath. She has taking Pepto bismol, she feels bloated and gasey, slight nausea, no diarrhea or vomiting. Decreased appetite. No fever. NO urinary issues to speak of. She does recall the evening abdominal pain began she had an onion ring that did not taste right but finished it though and also states she has Celiac disease and she has not followed a diet with that so maybe that is acting up. She still has her gallbladder. No relief from BM today.    Allergies  Allergen Reactions  . Benadryl [Diphenhydramine Hcl (Sleep)]   . Advil [Ibuprofen] Rash    CAN TAKE NAPROSYN  . Claritin [Loratadine] Rash   Previous Medications   ALENDRONATE (FOSAMAX) 70 MG TABLET    Take 1 tablet by mouth once a week   ALPRAZOLAM (XANAX) 0.5 MG TABLET    Take 1 tablet (0.5 mg total) by mouth at bedtime as needed for anxiety.   CALCIUM CARBONATE (CALCIUM 600 PO)    Take 2 tablets by mouth daily.   CHOLECALCIFEROL (VITAMIN D) 400 UNITS TABS TABLET    Take 400 Units by mouth.   LEVOTHYROXINE (SYNTHROID, LEVOTHROID) 100 MCG TABLET    Take 1 tablet by mouth  daily   MULTIPLE VITAMIN PO    Take by mouth.   SERTRALINE (ZOLOFT) 100 MG TABLET    Take 1 tablet by mouth  every day    Review of Systems  Constitutional: Positive for appetite change and fatigue.  Respiratory: Negative.   Cardiovascular: Negative.   Gastrointestinal: Positive for nausea, abdominal pain and abdominal distention.  Genitourinary:  Negative.     Social History  Substance Use Topics  . Smoking status: Never Smoker   . Smokeless tobacco: Not on file  . Alcohol Use: Yes     Comment: a glass of wine once in a while   Objective:   BP 156/84 mmHg  Pulse 72  Temp(Src) 98.2 F (36.8 C)  Resp 16  Wt 128 lb (58.06 kg)  Physical Exam  Constitutional: She is oriented to person, place, and time. She appears well-developed and well-nourished. No distress.  HENT:  Head: Normocephalic and atraumatic.  Right Ear: Hearing normal.  Left Ear: Hearing normal.  Nose: Nose normal.  Wearing hearing aids.  Eyes: Conjunctivae and lids are normal. Right eye exhibits no discharge. Left eye exhibits no discharge. No scleral icterus.  Neck: Neck supple.  Cardiovascular: Normal rate and regular rhythm.   Pulmonary/Chest: Effort normal and breath sounds normal. No respiratory distress.  Abdominal: She exhibits distension.  Minimal soreness intermittently with bloating in upper abdomen. Bowel sounds essentially normal. No masses, rebound or rigidity.  Musculoskeletal: Normal range of motion.  Neurological: She is alert and oriented to person, place, and time.  Skin: Skin is intact. No lesion and no rash noted.  Psychiatric: She has a normal mood and affect. Her speech is normal and  behavior is normal. Thought content normal.      Assessment & Plan:     1. Pain of upper abdomen Onset over the past 2 nights with some cramping and bloating. No diarrhea, nausea, vomiting or dysuria.  No melena, hematochezia or hematuria. Suspect gas and constipation. Increase fluid intake and use Glycerine suppository with Citrate of Magnesia if no sign of obstruction on KUB. No acute pain during office visit. - DG Abd 2 Views  2. Bloating symptom Slight tympany to percuss upper abdomen. No significant pain, rebound or rigidity to examine. May use Gas-X or Mylicon with Beano prn. May be due to constipation (recurrent). Last BM was compact and dry  yesterday.  3. Celiac disease Diagnosed by blood tests only. Dr. Tiffany Kocher stated endoscopy was negative by tissue biopsy. Recommend she get back on the gluten-free diet.  4. Gastroesophageal reflux disease, esophagitis presence not specified Occasional dyspepsia without hematemesis or blood in stools. May use Zantac 150 mg BID for 2 weeks and limit spicy or greasy foods. Recheck prn.       Vernie Murders, PA  [facility]

## 2015-08-27 NOTE — Patient Instructions (Signed)
Gastroesophageal Reflux Disease, Adult Normally, food travels down the esophagus and stays in the stomach to be digested. However, when a person has gastroesophageal reflux disease (GERD), food and stomach acid move back up into the esophagus. When this happens, the esophagus becomes sore and inflamed. Over time, GERD can create small holes (ulcers) in the lining of the esophagus.  CAUSES This condition is caused by a problem with the muscle between the esophagus and the stomach (lower esophageal sphincter, or LES). Normally, the LES muscle closes after food passes through the esophagus to the stomach. When the LES is weakened or abnormal, it does not close properly, and that allows food and stomach acid to go back up into the esophagus. The LES can be weakened by certain dietary substances, medicines, and medical conditions, including:  Tobacco use.  Pregnancy.  Having a hiatal hernia.  Heavy alcohol use.  Certain foods and beverages, such as coffee, chocolate, onions, and peppermint. RISK FACTORS This condition is more likely to develop in:  People who have an increased body weight.  People who have connective tissue disorders.  People who use NSAID medicines. SYMPTOMS Symptoms of this condition include:  Heartburn.  Difficult or painful swallowing.  The feeling of having a lump in the throat.  Abitter taste in the mouth.  Bad breath.  Having a large amount of saliva.  Having an upset or bloated stomach.  Belching.  Chest pain.  Shortness of breath or wheezing.  Ongoing (chronic) cough or a night-time cough.  Wearing away of tooth enamel.  Weight loss. Different conditions can cause chest pain. Make sure to see your health care provider if you experience chest pain. DIAGNOSIS Your health care provider will take a medical history and perform a physical exam. To determine if you have mild or severe GERD, your health care provider may also monitor how you respond  to treatment. You may also have other tests, including:  An endoscopy toexamine your stomach and esophagus with a small camera.  A test thatmeasures the acidity level in your esophagus.  A test thatmeasures how much pressure is on your esophagus.  A barium swallow or modified barium swallow to show the shape, size, and functioning of your esophagus. TREATMENT The goal of treatment is to help relieve your symptoms and to prevent complications. Treatment for this condition may vary depending on how severe your symptoms are. Your health care provider may recommend:  Changes to your diet.  Medicine.  Surgery. HOME CARE INSTRUCTIONS Diet  Follow a diet as recommended by your health care provider. This may involve avoiding foods and drinks such as:  Coffee and tea (with or without caffeine).  Drinks that containalcohol.  Energy drinks and sports drinks.  Carbonated drinks or sodas.  Chocolate and cocoa.  Peppermint and mint flavorings.  Garlic and onions.  Horseradish.  Spicy and acidic foods, including peppers, chili powder, curry powder, vinegar, hot sauces, and barbecue sauce.  Citrus fruit juices and citrus fruits, such as oranges, lemons, and limes.  Tomato-based foods, such as red sauce, chili, salsa, and pizza with red sauce.  Fried and fatty foods, such as donuts, french fries, potato chips, and high-fat dressings.  High-fat meats, such as hot dogs and fatty cuts of red and white meats, such as rib eye steak, sausage, ham, and bacon.  High-fat dairy items, such as whole milk, butter, and cream cheese.  Eat small, frequent meals instead of large meals.  Avoid drinking large amounts of liquid with your   meals.  Avoid eating meals during the 2-3 hours before bedtime.  Avoid lying down right after you eat.  Do not exercise right after you eat. General Instructions  Pay attention to any changes in your symptoms.  Take over-the-counter and prescription  medicines only as told by your health care provider. Do not take aspirin, ibuprofen, or other NSAIDs unless your health care provider told you to do so.  Do not use any tobacco products, including cigarettes, chewing tobacco, and e-cigarettes. If you need help quitting, ask your health care provider.  Wear loose-fitting clothing. Do not wear anything tight around your waist that causes pressure on your abdomen.  Raise (elevate) the head of your bed 6 inches (15cm).  Try to reduce your stress, such as with yoga or meditation. If you need help reducing stress, ask your health care provider.  If you are overweight, reduce your weight to an amount that is healthy for you. Ask your health care provider for guidance about a safe weight loss goal.  Keep all follow-up visits as told by your health care provider. This is important. SEEK MEDICAL CARE IF:  You have new symptoms.  You have unexplained weight loss.  You have difficulty swallowing, or it hurts to swallow.  You have wheezing or a persistent cough.  Your symptoms do not improve with treatment.  You have a hoarse voice. SEEK IMMEDIATE MEDICAL CARE IF:  You have pain in your arms, neck, jaw, teeth, or back.  You feel sweaty, dizzy, or light-headed.  You have chest pain or shortness of breath.  You vomit and your vomit looks like blood or coffee grounds.  You faint.  Your stool is bloody or black.  You cannot swallow, drink, or eat.   This information is not intended to replace advice given to you by your health care provider. Make sure you discuss any questions you have with your health care provider.   Document Released: 02/22/2005 Document Revised: 02/03/2015 Document Reviewed: 09/09/2014 Elsevier Interactive Patient Education 2016 Elsevier Inc. Celiac Disease Celiac disease is an allergy to the protein that is called gluten. When a person with celiac disease eats a food that has gluten in it, his or her natural  defense system (immune system) attacks the cells that line the small intestine. Over time, this reaction damages the small intestine and makes the small intestine unable to absorb nutrients from food. Gluten is found in wheat, rye, and barley and in foods like pasta, pizza, and cereal. Celiac disease is also known as celiac sprue, nontropical sprue, and gluten-sensitive enteropathy. CAUSES This condition is caused by a gene that is passed down through families (inherited). RISK FACTORS This condition is more likely to develop in people who have a family member with the disease. SYMPTOMS Symptoms of this condition include:  Recurring bloating and pain in the abdomen.  Gas.  Long-term (chronic) diarrhea.  Pale, bad-smelling, greasy, or oily stool.  Weight loss.  Missed menstrual periods.  Weakening bones (osteoporosis).  Fatigue and weakness.  Tingling or other signs of nerve damage.  Depression.  Poor appetite.  Rash. In some cases, there are no symptoms. DIAGNOSIS This condition is diagnosed with a physical exam and tests. Tests may include:  Blood tests to check for nutritional deficiencies.  Blood tests to look for evidence that the body is attacking cells in the small intestine.  A test in which a sample of tissue is taken from the small bowel and examined under a microscope (biopsy).  X-rays of the bowel.  Stool tests.  Tests to check for nutrient absorption from the intestine. TREATMENT There is no cure for this condition, but it can be managed with a gluten-free diet. Treatment may also involve avoiding dairy foods, such as milk and cheese, because they are hard to digest. Most people who follow a gluten-free diet feel better and stop having symptoms. The intestine usually heals within 3 months to 2 years. In a small percentage of people, this condition does not improve on the gluten-free diet. If your condition does not improve, more tests will be done. You  will also need to work with a specialist in celiac disease to find the best treatment for you. HOME CARE INSTRUCTIONS  Follow instructions from your health care provider about diet.  Monitor your body's response to the gluten-free diet. Write down any changes in your symptoms and changes in how you feel.  If you decide to eat outside of the home, prepare your meal ahead of time, or make sure that the place where you are going has gluten-free options.  Keep all follow-up visits as told by your health care provider. This is important.  Suggest to family members that they get screened for early signs of the disease. SEEK MEDICAL CARE IF:  You continue to have symptoms, even when you are eating a gluten-free diet.  You have trouble sticking to the gluten-free diet.  You develop an itchy rash with groups of tiny blisters.  You develop severe weakness.  You develop balance problems.  You develop new symptoms.   This information is not intended to replace advice given to you by your health care provider. Make sure you discuss any questions you have with your health care provider.   Document Released: 05/15/2005 Document Revised: 02/03/2015 Document Reviewed: 09/07/2014 Elsevier Interactive Patient Education 2016 [employer]. Constipation, Adult Constipation is when a person has fewer than three bowel movements a week, has difficulty having a bowel movement, or has stools that are dry, hard, or larger than normal. As people grow older, constipation is more common. A low-fiber diet, not taking in enough fluids, and taking certain medicines may make constipation worse.  CAUSES   Certain medicines, such as antidepressants, pain medicine, iron supplements, antacids, and water pills.   Certain diseases, such as diabetes, irritable bowel syndrome (IBS), thyroid disease, or depression.   Not drinking enough water.   Not eating enough fiber-rich foods.   Stress or travel.   Lack of  physical activity or exercise.   Ignoring the urge to have a bowel movement.   Using laxatives too much.  SIGNS AND SYMPTOMS   Having fewer than three bowel movements a week.   Straining to have a bowel movement.   Having stools that are hard, dry, or larger than normal.   Feeling full or bloated.   Pain in the lower abdomen.   Not feeling relief after having a bowel movement.  DIAGNOSIS  Your health care provider will take a medical history and perform a physical exam. Further testing may be done for severe constipation. Some tests may include:  A barium enema X-ray to examine your rectum, colon, and, sometimes, your small intestine.   A sigmoidoscopy to examine your lower colon.   A colonoscopy to examine your entire colon. TREATMENT  Treatment will depend on the severity of your constipation and what is causing it. Some dietary treatments include drinking more fluids and eating more fiber-rich foods. Lifestyle treatments may include regular  exercise. If these diet and lifestyle recommendations do not help, your health care provider may recommend taking over-the-counter laxative medicines to help you have bowel movements. Prescription medicines may be prescribed if over-the-counter medicines do not work.  HOME CARE INSTRUCTIONS   Eat foods that have a lot of fiber, such as fruits, vegetables, whole grains, and beans.  Limit foods high in fat and processed sugars, such as french fries, hamburgers, cookies, candies, and soda.   A fiber supplement may be added to your diet if you cannot get enough fiber from foods.   Drink enough fluids to keep your urine clear or pale yellow.   Exercise regularly or as directed by your health care provider.   Go to the restroom when you have the urge to go. Do not hold it.   Only take over-the-counter or prescription medicines as directed by your health care provider. Do not take other medicines for constipation without  talking to your health care provider first.  [place] IF:   You have bright red blood in your stool.   Your constipation lasts for more than 4 days or gets worse.   You have abdominal or rectal pain.   You have thin, pencil-like stools.   You have unexplained weight loss. MAKE SURE YOU:   Understand these instructions.  Will watch your condition.  Will get help right away if you are not doing well or get worse.   This information is not intended to replace advice given to you by your health care provider. Make sure you discuss any questions you have with your health care provider.   Document Released: 02/11/2004 Document Revised: 06/05/2014 Document Reviewed: 02/24/2013 Elsevier Interactive Patient Education [employer].

## 2015-09-22 ENCOUNTER — Other Ambulatory Visit: Payer: Self-pay

## 2015-09-22 DIAGNOSIS — F419 Anxiety disorder, unspecified: Secondary | ICD-10-CM

## 2015-09-22 MED ORDER — ALPRAZOLAM 0.5 MG PO TABS: 0.5000 mg | ORAL_TABLET | Freq: Every evening | ORAL | Status: DC | PRN

## 2015-09-22 NOTE — Telephone Encounter (Signed)
RX refill requested from [facility] for Xanax, please review-aa

## 2015-10-07 ENCOUNTER — Ambulatory Visit
Admission: RE | Admit: 2015-10-07 | Discharge: 2015-10-07 | Disposition: A | Discharge status: Home / Self Care | Payer: Medicare Other | Source: Ambulatory Visit | Attending: Family Medicine | Admitting: Family Medicine

## 2015-10-07 ENCOUNTER — Telehealth: Payer: Self-pay

## 2015-10-07 ENCOUNTER — Ambulatory Visit: Payer: Self-pay | Admitting: Family Medicine

## 2015-10-07 ENCOUNTER — Ambulatory Visit (INDEPENDENT_AMBULATORY_CARE_PROVIDER_SITE_OTHER): Payer: Medicare Other | Admitting: Family Medicine

## 2015-10-07 VITALS — BP 124/62 | HR 76 | Temp 97.9°F | Resp 16 | Wt 131.0 lb

## 2015-10-07 DIAGNOSIS — M79672 Pain in left foot: Secondary | ICD-10-CM

## 2015-10-07 DIAGNOSIS — M25872 Other specified joint disorders, left ankle and foot: Secondary | ICD-10-CM | POA: Insufficient documentation

## 2015-10-07 DIAGNOSIS — M7989 Other specified soft tissue disorders: Secondary | ICD-10-CM | POA: Diagnosis not present

## 2015-10-07 DIAGNOSIS — M8430XA Stress fracture, unspecified site, initial encounter for fracture: Secondary | ICD-10-CM | POA: Diagnosis not present

## 2015-10-07 NOTE — Telephone Encounter (Signed)
[id number] 

## 2015-10-07 NOTE — Patient Instructions (Signed)
Patient is to ice and elevate the foot for 3 days and then use heat.  Obtain x-rays.  If fractured we will refer.

## 2015-10-07 NOTE — Telephone Encounter (Signed)
-----   Message from Jerrol Banana., MD sent at 10/07/2015  3:19 PM EDT ----- X-ray okay but I still think this fits with stress fracture. Consider Depo-Medrol 80 mg here in the office to help with inflammation of the foot. It will not take care of the pain. Still thinks patient should be nonweightbearing as much as possible.

## 2015-10-07 NOTE — Progress Notes (Signed)
Patient ID: Sandra [name], female   DOB: 1945/08/10, 70 y.o.   MRN: [id number]   Sandra [name]  MRN: [id number] DOB: 09-22-1945  Subjective:  HPI  The patient is a 70 year female who presents for evaluation of her left foot pain and swelling.  She states it started about 1 week ago.  There has been no known trauma.  She was at the beach last week and was unable to walk on the beach which is something she usually likes to do.  She is typically very active but it is becoming more difficult to continue her usual activities.  Patient Active Problem List   Diagnosis Date Noted  . Colon polyps 01/06/2015  . BCC (basal cell carcinoma of skin) 01/06/2015  . Anemia 01/06/2015  . Hypothyroidism 01/06/2015  . Vitamin D deficiency 01/06/2015  . Hyperlipidemia 01/06/2015  . Depression, major ([place]) 01/06/2015  . GAD (generalized anxiety disorder) 01/06/2015  . RLS (restless legs syndrome) 01/06/2015  . Cataract 01/06/2015  . Raynaud phenomenon 01/06/2015  . GERD (gastroesophageal reflux disease) 01/06/2015  . Celiac disease 01/06/2015  . OA (osteoarthritis) 01/06/2015  . Osteoporosis 01/06/2015  . Pre-diabetes 01/06/2015  . Avitaminosis D 01/06/2015  . Anxiety disorder 01/05/2015    Past Medical History  Diagnosis Date  . Celiac disease   . Anemia   . Anxiety   . GERD (gastroesophageal reflux disease)   . Arthritis   . Raynaud's disease   . RLS (restless legs syndrome)   . Pneumonia   . Hypothyroidism   . Depression     Social History   Social History  . Marital Status: Married    Spouse Name: N/A  . Number of Children: N/A  . Years of Education: N/A   Occupational History  . Not on file.   Social History Main Topics  . Smoking status: Never Smoker   . Smokeless tobacco: Not on file  . Alcohol Use: Yes     Comment: a glass of wine once in a while  . Drug Use: No  . Sexual Activity: Not on file   Other Topics Concern  . Not on file   Social History Narrative     Outpatient Prescriptions Prior to Visit  Medication Sig Dispense Refill  . alendronate (FOSAMAX) 70 MG tablet Take 1 tablet by mouth once a week 12 tablet 3  . ALPRAZolam (XANAX) 0.5 MG tablet Take 1 tablet (0.5 mg total) by mouth at bedtime as needed for anxiety. 90 tablet 1  . Calcium Carbonate (CALCIUM 600 PO) Take 2 tablets by mouth daily.    . cholecalciferol (VITAMIN D) 400 UNITS TABS tablet Take 400 Units by mouth.    . levothyroxine (SYNTHROID, LEVOTHROID) 100 MCG tablet Take 1 tablet by mouth  daily 90 tablet 3  . MULTIPLE VITAMIN PO Take by mouth.    . sertraline (ZOLOFT) 100 MG tablet Take 1 tablet by mouth  every day 90 tablet 3   No facility-administered medications prior to visit.    Allergies  Allergen Reactions  . Benadryl [Diphenhydramine Hcl (Sleep)]   . Advil [Ibuprofen] Rash    CAN TAKE NAPROSYN  . Claritin [Loratadine] Rash    Review of Systems  Constitutional: Negative for fever and malaise/fatigue.  Respiratory: Negative for cough, shortness of breath and wheezing.   Cardiovascular: Negative for chest pain, palpitations, orthopnea, claudication, leg swelling and PND.       Left foot swelling  Musculoskeletal: Negative for myalgias, back pain, joint  pain, falls and neck pain.  Neurological: Negative for dizziness, weakness and headaches.  Psychiatric/Behavioral: Negative.    Objective:  BP 124/62 mmHg  Pulse 76  Temp(Src) 97.9 F (36.6 C) (Oral)  Resp 16  Wt 131 lb (59.421 kg)  Physical Exam  Constitutional: She is oriented to person, place, and time and well-developed, well-nourished, and in no distress.  HENT:  Head: Normocephalic.  Right Ear: External ear normal.  Left Ear: External ear normal.  Nose: Nose normal.  Eyes: Conjunctivae are normal.  Cardiovascular: Normal rate, regular rhythm and normal heart sounds.   Pulmonary/Chest: Effort normal and breath sounds normal.  Musculoskeletal:   Diffuse swelling of left foot this the midfoot  behind the metatarsal pads of the region of the third fourth and fifth metatarsals  Neurological: She is alert and oriented to person, place, and time.  Skin: Skin is warm and dry.  Psychiatric: Mood, memory, affect and judgment normal.    Assessment and Plan :   1. Left foot pain Probable stress fracture.  Will obtain x-ray.  Patient is instructed to ice and elevate the foot for 3 days, then use heat.  Patient to stay off of the foot as much as possible for the rest of May.  Will plan to refer the patient if it is fractured. Patient wishes to avoid orthopedic referral or podiatry referral if possible  - DG Foot Complete Left; Future  2. Stress fracture, initial encounter  stress fracture is most likely etiology of this issue. - DG Foot Complete Left; Future  3. Osteoporosis  BMD when appropriate  [name] MD [facility] 10/07/2015 11:16 AM

## 2015-10-08 MED ORDER — PREDNISONE 10 MG PO TABS
10.0000 mg | ORAL_TABLET | Freq: Every day | ORAL | Status: DC
Start: 2015-10-08 — End: 2015-11-04

## 2015-10-08 NOTE — Telephone Encounter (Signed)
Pt informed. Spoke with Dr. Rosanna Randy, as pt wanted to know about a NSAID, she did not know if one was called in or what the decision was with that. Per Dr. Rosanna Randy verbal order sent in 6 day Prednisone taper.

## 2015-10-11 ENCOUNTER — Ambulatory Visit: Payer: Self-pay | Admitting: Family Medicine

## 2015-11-04 ENCOUNTER — Ambulatory Visit (INDEPENDENT_AMBULATORY_CARE_PROVIDER_SITE_OTHER): Payer: Medicare Other | Admitting: Family Medicine

## 2015-11-04 VITALS — BP 142/76 | HR 72 | Temp 97.6°F | Resp 16 | Wt 129.0 lb

## 2015-11-04 DIAGNOSIS — M79672 Pain in left foot: Secondary | ICD-10-CM | POA: Diagnosis not present

## 2015-11-04 NOTE — Progress Notes (Signed)
Patient ID: Sandra [name], female   DOB: 17-Dec-1945, 70 y.o.   MRN: [id number]   TECIA CINNAMON  MRN: [id number] DOB: [birth year]  Subjective:  HPI   The patient is a 70 year old female who presents for follow up of her left foot pain.  She was last seen on 10/07/15.  Her x-ray revealed no acute fracture or dislocation.  She states that it has improved as far as swelling and pain.  She does still have some discomfort in the foot but improving.  Patient Active Problem List   Diagnosis Date Noted  . Colon polyps 01/06/2015  . BCC (basal cell carcinoma of skin) 01/06/2015  . Anemia 01/06/2015  . Hypothyroidism 01/06/2015  . Vitamin D deficiency 01/06/2015  . Hyperlipidemia 01/06/2015  . Depression, major ([place]) 01/06/2015  . GAD (generalized anxiety disorder) 01/06/2015  . RLS (restless legs syndrome) 01/06/2015  . Cataract 01/06/2015  . Raynaud phenomenon 01/06/2015  . GERD (gastroesophageal reflux disease) 01/06/2015  . Celiac disease 01/06/2015  . OA (osteoarthritis) 01/06/2015  . Osteoporosis 01/06/2015  . Pre-diabetes 01/06/2015  . Avitaminosis D 01/06/2015  . Anxiety disorder 01/05/2015    Past Medical History  Diagnosis Date  . Celiac disease   . Anemia   . Anxiety   . GERD (gastroesophageal reflux disease)   . Arthritis   . Raynaud's disease   . RLS (restless legs syndrome)   . Pneumonia   . Hypothyroidism   . Depression     Social History   Social History  . Marital Status: Married    Spouse Name: N/A  . Number of Children: N/A  . Years of Education: N/A   Occupational History  . Not on file.   Social History Main Topics  . Smoking status: Never Smoker   . Smokeless tobacco: Not on file  . Alcohol Use: Yes     Comment: a glass of wine once in a while  . Drug Use: No  . Sexual Activity: Not on file   Other Topics Concern  . Not on file   Social History Narrative    Outpatient Prescriptions Prior to Visit  Medication Sig Dispense Refill  .  alendronate (FOSAMAX) 70 MG tablet Take 1 tablet by mouth once a week 12 tablet 3  . Alpha-D-Galactosidase (BEANO) TABS Take 1 tablet by mouth daily as needed.    . ALPRAZolam (XANAX) 0.5 MG tablet Take 1 tablet (0.5 mg total) by mouth at bedtime as needed for anxiety. 90 tablet 1  . Calcium Carbonate (CALCIUM 600 PO) Take 2 tablets by mouth daily.    . cholecalciferol (VITAMIN D) 400 UNITS TABS tablet Take 400 Units by mouth.    . docusate sodium (COLACE) 100 MG capsule Take 100 mg by mouth 2 (two) times daily.    [name] levothyroxine (SYNTHROID, LEVOTHROID) 100 MCG tablet Take 1 tablet by mouth  daily 90 tablet 3  . MULTIPLE VITAMIN PO Take by mouth.    . ranitidine (ZANTAC) 150 MG tablet Take 150 mg by mouth 2 (two) times daily.    [name] senna (SENOKOT) 8.6 MG TABS tablet Take 1 tablet by mouth daily as needed for mild constipation.    . sertraline (ZOLOFT) 100 MG tablet Take 1 tablet by mouth  every day 90 tablet 3  . simethicone (MYLICON) 782 MG chewable tablet Chew 125 mg by mouth every 6 (six) hours as needed for flatulence.    . predniSONE (DELTASONE) 10 MG tablet Take 1 tablet (10  mg total) by mouth daily with breakfast. 6 day prednisone taper. Use as directed. 21 tablet 0   No facility-administered medications prior to visit.    Allergies  Allergen Reactions  . Benadryl [Diphenhydramine Hcl (Sleep)]   . Advil [Ibuprofen] Rash    CAN TAKE NAPROSYN  . Claritin [Loratadine] Rash    Review of Systems  Constitutional: Negative for fever and malaise/fatigue.  Respiratory: Negative for cough, shortness of breath and wheezing.   Cardiovascular: Negative for chest pain, palpitations, orthopnea, claudication, leg swelling and PND.  Musculoskeletal: Positive for joint pain (shoulder and foot.').  Neurological: Positive for headaches. Negative for dizziness and weakness.   Objective:  BP 142/76 mmHg  Pulse 72  Temp(Src) 97.6 F (36.4 C) (Oral)  Resp 16  Wt 129 lb (58.514 kg)  Physical  Exam  Constitutional: She is oriented to person, place, and time and well-developed, well-nourished, and in no distress.  HENT:  Head: Normocephalic and atraumatic.  Right Ear: External ear normal.  Left Ear: External ear normal.  Nose: Nose normal.  Eyes: Conjunctivae are normal.  Neck: Neck supple.  Cardiovascular: Normal rate and regular rhythm.   Pulmonary/Chest: Effort normal and breath sounds normal.  Abdominal: Soft.  Musculoskeletal: She exhibits no edema or tenderness.  The edema and tenderness that was noted in the left foot before is almost completely resolved. She has a little bit of tenderness left over the mid 5th metatarsal  Neurological: She is alert and oriented to person, place, and time.  Skin: Skin is warm and dry.  Psychiatric: Mood, memory, affect and judgment normal.    Assessment and Plan :  Left foot pain  Most consistent with stress reaction which is improving with conservative treatment. Major depression/generalized anxiety disorder In remission/controlled. I have done the exam and reviewed the above chart and it is accurate to the best of my knowledge.  [name] MD [facility] 11/04/2015 11:17 AM

## 2015-11-09 ENCOUNTER — Other Ambulatory Visit: Payer: Self-pay | Admitting: Family Medicine

## 2015-12-09 ENCOUNTER — Encounter: Payer: Self-pay | Admitting: Family Medicine

## 2015-12-09 ENCOUNTER — Other Ambulatory Visit: Payer: Self-pay | Admitting: Family Medicine

## 2015-12-09 ENCOUNTER — Ambulatory Visit
Admission: RE | Admit: 2015-12-09 | Discharge: 2015-12-09 | Disposition: A | Discharge status: Home / Self Care | Payer: Medicare Other | Source: Ambulatory Visit | Attending: Family Medicine | Admitting: Family Medicine

## 2015-12-09 ENCOUNTER — Telehealth: Payer: Self-pay | Admitting: Family Medicine

## 2015-12-09 ENCOUNTER — Ambulatory Visit (INDEPENDENT_AMBULATORY_CARE_PROVIDER_SITE_OTHER): Payer: Medicare Other | Admitting: Family Medicine

## 2015-12-09 VITALS — BP 142/68 | HR 76 | Resp 16 | Wt 132.0 lb

## 2015-12-09 DIAGNOSIS — S92355A Nondisplaced fracture of fifth metatarsal bone, left foot, initial encounter for closed fracture: Secondary | ICD-10-CM | POA: Diagnosis not present

## 2015-12-09 DIAGNOSIS — S99922A Unspecified injury of left foot, initial encounter: Secondary | ICD-10-CM

## 2015-12-09 DIAGNOSIS — W19XXXA Unspecified fall, initial encounter: Secondary | ICD-10-CM | POA: Insufficient documentation

## 2015-12-09 DIAGNOSIS — S92302A Fracture of unspecified metatarsal bone(s), left foot, initial encounter for closed fracture: Secondary | ICD-10-CM

## 2015-12-09 NOTE — Progress Notes (Addendum)
Subjective:     Patient ID: Sandra [name], female   DOB: 1945-07-01, 70 y.o.   MRN: [id number]  HPI  Chief Complaint  Patient presents with  . Foot Pain    Left. Originally injured foot in May. See XRay report. Pt reports she was told ot may have been a "stress fx" by Dr. Rosanna Randy. Pt fell yesterday afternoon, and re-injured the same area. Is swollen and is difficult to ambulate.   States she was putting on clothes yesterday PM when she caught her leg and lost her balance. Not sure whether she twisted her ankle or not. Has used ice and taken ibuprofen. Prior x-ray without evidence of acute fracture.   Review of Systems     Objective:   Physical Exam  Constitutional: She appears well-developed and well-nourished. She appears distressed (antalgic gait).  Cardiovascular:  Pulses:      Dorsalis pedis pulses are 2+ on the left side.       Posterior tibial pulses are 2+ on the left side.  Musculoskeletal:  Left foot with swelling, ecchynmosis and tenderness over the dorsum of her foot and fifth metatarsal.. Ankle ligaments stable but increased pain on inversion testing. Swelling not present over her lateral malleolus.       Assessment:    1. Foot injury, left, initial encounter: ? Foot sprain r/o fx (Jones/Lisfranc) - DG Foot Complete Left; Future    Plan:   ACE wrap applied. Discussed RICE and nsaid use. Further f/u pending x-ray report.

## 2015-12-09 NOTE — Telephone Encounter (Signed)
Please review below message-aa

## 2015-12-09 NOTE — Telephone Encounter (Signed)
Pt called back and scheduled an appt to see Mikki Santee this afternoon. Thanks TNP

## 2015-12-09 NOTE — Telephone Encounter (Signed)
Pt has re-injured her left foot.  She is wanting to know if she needs to do anything about it since last time they didn't do anything.  She is wanting advise

## 2015-12-09 NOTE — Patient Instructions (Signed)
Try two Aleve twice daily with food. Continue RICE.

## 2015-12-09 NOTE — Telephone Encounter (Signed)
Per noted first visit was in May for left foot trauma-xray showed old trauma but no fracture, she was advised to use ice and elevation and on follow up on June 8th she was improving but still had some discomfort. How to proceed now?-aa

## 2015-12-09 NOTE — Telephone Encounter (Signed)
Pt is requesting a call back on [phone]

## 2015-12-10 ENCOUNTER — Other Ambulatory Visit: Payer: Self-pay | Admitting: Family Medicine

## 2015-12-10 DIAGNOSIS — S92302A Fracture of unspecified metatarsal bone(s), left foot, initial encounter for closed fracture: Secondary | ICD-10-CM

## 2015-12-10 DIAGNOSIS — S92355A Nondisplaced fracture of fifth metatarsal bone, left foot, initial encounter for closed fracture: Secondary | ICD-10-CM | POA: Diagnosis not present

## 2015-12-24 DIAGNOSIS — S92355A Nondisplaced fracture of fifth metatarsal bone, left foot, initial encounter for closed fracture: Secondary | ICD-10-CM | POA: Diagnosis not present

## 2016-01-03 NOTE — Telephone Encounter (Signed)
error 

## 2016-01-14 ENCOUNTER — Other Ambulatory Visit: Payer: Self-pay | Admitting: Family Medicine

## 2016-01-17 ENCOUNTER — Other Ambulatory Visit: Payer: Self-pay

## 2016-01-17 DIAGNOSIS — F419 Anxiety disorder, unspecified: Secondary | ICD-10-CM

## 2016-01-17 NOTE — Telephone Encounter (Signed)
Refill request from [facility] for Xanax refill. Please review-aa

## 2016-01-18 MED ORDER — ALPRAZOLAM 0.5 MG PO TABS: 0.5000 mg | ORAL_TABLET | Freq: Every evening | ORAL | 1 refills | Status: DC | PRN

## 2016-01-19 DIAGNOSIS — H40003 Preglaucoma, unspecified, bilateral: Secondary | ICD-10-CM | POA: Diagnosis not present

## 2016-01-21 DIAGNOSIS — S92355D Nondisplaced fracture of fifth metatarsal bone, left foot, subsequent encounter for fracture with routine healing: Secondary | ICD-10-CM | POA: Diagnosis not present

## 2016-01-25 DIAGNOSIS — H40003 Preglaucoma, unspecified, bilateral: Secondary | ICD-10-CM | POA: Diagnosis not present

## 2016-02-21 DIAGNOSIS — S92355D Nondisplaced fracture of fifth metatarsal bone, left foot, subsequent encounter for fracture with routine healing: Secondary | ICD-10-CM | POA: Diagnosis not present

## 2016-04-07 ENCOUNTER — Ambulatory Visit (INDEPENDENT_AMBULATORY_CARE_PROVIDER_SITE_OTHER): Payer: Medicare Other | Admitting: Family Medicine

## 2016-04-07 VITALS — BP 140/82 | HR 72 | Temp 98.8°F | Ht 63.0 in | Wt 130.0 lb

## 2016-04-07 DIAGNOSIS — Z0000 Encounter for general adult medical examination without abnormal findings: Secondary | ICD-10-CM | POA: Diagnosis not present

## 2016-04-07 DIAGNOSIS — Z23 Encounter for immunization: Secondary | ICD-10-CM

## 2016-04-07 NOTE — Patient Instructions (Signed)
Sandra [name] , Thank you for taking time to come for your Medicare Wellness Visit. I appreciate your ongoing commitment to your health goals. Please review the following plan we discussed and let me know if I can assist you in the future.   These are the goals we discussed: Goals    . Increase water intake          Starting 04/07/16, I will increase my water intake to 4-5 glasses a day.       This is a list of the screening recommended for you and due dates:  Health Maintenance  Topic Date Due  .  Hepatitis C: One time screening is recommended by Center for Disease Control  (CDC) for  adults born from [birth year] through 1966.   05/28/2016*  . Shingles Vaccine  04/07/2017*  . Mammogram  05/02/2017  . Tetanus Vaccine  03/18/2018  . Colon Cancer Screening  05/18/2025  . Flu Shot  Completed  . DEXA scan (bone density measurement)  Completed  . Pneumonia vaccines  Completed  *Topic was postponed. The date shown is not the original due date.   Preventive Care for Adults  A healthy lifestyle and preventive care can promote health and wellness. Preventive health guidelines for adults include the following key practices.  . A routine yearly physical is a good way to check with your health care provider about your health and preventive screening. It is a chance to share any concerns and updates on your health and to receive a thorough exam.  . Visit your dentist for a routine exam and preventive care every 6 months. Brush your teeth twice a day and floss once a day. Good oral hygiene prevents tooth decay and gum disease.  . The frequency of eye exams is based on your age, health, family medical history, use  of contact lenses, and other factors. Follow your health care provider's ecommendations for frequency of eye exams.  . Eat a healthy diet. Foods like vegetables, fruits, whole grains, low-fat dairy products, and lean protein foods contain the nutrients you need without too many calories.  Decrease your intake of foods high in solid fats, added sugars, and salt. Eat the right amount of calories for you. Get information about a proper diet from your health care provider, if necessary.  . Regular physical exercise is one of the most important things you can do for your health. Most adults should get at least 150 minutes of moderate-intensity exercise (any activity that increases your heart rate and causes you to sweat) each week. In addition, most adults need muscle-strengthening exercises on 2 or more days a week.  Silver Sneakers may be a benefit available to you. To determine eligibility, you may visit the website: www.silversneakers.com or contact program at 510-625-9607 Mon-Fri between 8AM-8PM.   . Maintain a healthy weight. The body mass index (BMI) is a screening tool to identify possible weight problems. It provides an estimate of body fat based on height and weight. Your health care provider can find your BMI and can help you achieve or maintain a healthy weight.   For adults 20 years and older: ? A BMI below 18.5 is considered underweight. ? A BMI of 18.5 to 24.9 is normal. ? A BMI of 25 to 29.9 is considered overweight. ? A BMI of 30 and above is considered obese.   . Maintain normal blood lipids and cholesterol levels by exercising and minimizing your intake of saturated fat. Eat a balanced diet with  plenty of fruit and vegetables. Blood tests for lipids and cholesterol should begin at age 31 and be repeated every 5 years. If your lipid or cholesterol levels are high, you are over 50, or you are at high risk for heart disease, you may need your cholesterol levels checked more frequently. Ongoing high lipid and cholesterol levels should be treated with medicines if diet and exercise are not working.  . If you smoke, find out from your health care provider how to quit. If you do not use tobacco, please do not start.  . If you choose to drink alcohol, please do not consume  more than 2 drinks per day. One drink is considered to be 12 ounces (355 mL) of beer, 5 ounces (148 mL) of wine, or 1.5 ounces (44 mL) of liquor.  . If you are 77-32 years old, ask your health care provider if you should take aspirin to prevent strokes.  . Use sunscreen. Apply sunscreen liberally and repeatedly throughout the day. You should seek shade when your shadow is shorter than you. Protect yourself by wearing long sleeves, pants, a wide-brimmed hat, and sunglasses year round, whenever you are outdoors.  . Once a month, do a whole body skin exam, using a mirror to look at the skin on your back. Tell your health care provider of new moles, moles that have irregular borders, moles that are larger than a pencil eraser, or moles that have changed in shape or color.

## 2016-04-07 NOTE — Progress Notes (Signed)
Subjective:   Sandra [name] is a 70 y.o. female who presents for Medicare Annual (Subsequent) preventive examination.  Review of Systems:  N/A  Cardiac Risk Factors include: advanced age (>42mn, >>76women);dyslipidemia;diabetes mellitus     Objective:     Vitals: BP 140/82 (BP Location: Right Arm)   Pulse 72   Temp 98.8 F (37.1 C) (Oral)   Ht 5' 3"  (1.6 m)   Wt 130 lb (59 kg)   BMI 23.03 kg/m   Body mass index is 23.03 kg/m.   Tobacco History  Smoking Status  . Never Smoker  Smokeless Tobacco  . Never Used     Counseling given: Not Answered   Past Medical History:  Diagnosis Date  . Anemia   . Anxiety   . Arthritis   . Celiac disease   . Depression   . GERD (gastroesophageal reflux disease)   . Hypothyroidism   . Pneumonia   . Raynaud's disease   . RLS (restless legs syndrome)    Past Surgical History:  Procedure Laterality Date  . ABDOMINAL HYSTERECTOMY    . APPENDECTOMY    . CATARACT EXTRACTION Bilateral   . CESAREAN SECTION    . COLONOSCOPY WITH PROPOFOL N/A 05/19/2015   Procedure: COLONOSCOPY WITH PROPOFOL;  Surgeon: RManya Silvas MD;  [facility];  Service: Endoscopy;  Laterality: N/A;  . EYE SURGERY    . HAND SURGERY Left   . TONSILLECTOMY    . UPPER GI ENDOSCOPY  01/12/06   hiatus hernia   Family History  Problem Relation Age of Onset  . Cancer Mother   . Anemia Mother   . Other Mother     lymphosarcoma  . Alcohol abuse Father   . Prostate cancer Father   . Breast cancer Sister   . Breast cancer Maternal Grandmother   . Stroke Maternal Grandmother   . Heart attack Maternal Grandfather   . CAD Paternal Grandfather    History  Sexual Activity  . Sexual activity: Not on file    Outpatient Encounter Prescriptions as of 04/07/2016  Medication Sig  . alendronate (FOSAMAX) 70 MG tablet Take 1 tablet by mouth once a week  . Alpha-D-Galactosidase (BEANO) TABS Take 1 tablet by mouth daily.  .Marland KitchenALPRAZolam (XANAX) 0.5  MG tablet Take 1 tablet (0.5 mg total) by mouth at bedtime as needed for anxiety.  . Calcium Carbonate (CALCIUM 600 PO) Take 2 tablets by mouth daily.  . Cholecalciferol (VITAMIN D3) 10000 units TABS Take 1 tablet by mouth daily.  .Marland Kitchendocusate sodium (COLACE) 100 MG capsule Take 100 mg by mouth 2 (two) times daily.  .Marland Kitchenlevothyroxine (SYNTHROID, LEVOTHROID) 100 MCG tablet Take 1 tablet by mouth  daily  . MULTIPLE VITAMIN PO Take by mouth.  . Psyllium (EQ DAILY FIBER PO) Take 1 tablet by mouth daily.  . ranitidine (ZANTAC) 150 MG tablet Take 150 mg by mouth 2 (two) times daily.  .Marland Kitchensenna (SENOKOT) 8.6 MG TABS tablet Take 1 tablet by mouth daily as needed for mild constipation.  . sertraline (ZOLOFT) 100 MG tablet Take 1 tablet by mouth  every day  . vitamin E 400 UNIT capsule Take 400 Units by mouth daily.  . cholecalciferol (VITAMIN D) 400 UNITS TABS tablet Take 400 Units by mouth.  . simethicone (MYLICON) 1832MG chewable tablet Chew 125 mg by mouth every 6 (six) hours as needed for flatulence.   No facility-administered encounter medications on file as of 04/07/2016.  Activities of Daily Living In your present state of health, do you have any difficulty performing the following activities: 04/07/2016 04/13/2015  Hearing? Tempie Donning  Vision? N Y  Difficulty concentrating or making decisions? Tempie Donning  Walking or climbing stairs? Y N  Dressing or bathing? N N  Doing errands, shopping? N N  Preparing Food and eating ? N -  Using the Toilet? N -  In the past six months, have you accidently leaked urine? Y -  Do you have problems with loss of bowel control? N -  Managing your Medications? N -  Managing your Finances? N -  Housekeeping or managing your Housekeeping? N -  Some recent data might be hidden    Patient Care Team: Jerrol Banana., MD as PCP - General (Family Medicine) Leandrew Koyanagi, MD as Referring Physician (Ophthalmology) Manya Silvas, MD as Consulting Physician  (Gastroenterology)    Assessment:     Exercise Activities and Dietary recommendations Current Exercise Habits: The patient does not participate in regular exercise at present, Exercise limited by: None identified  Goals    . Increase water intake          Starting 04/07/16, I will increase my water intake to 4-5 glasses a day.      Fall Risk Fall Risk  04/07/2016 04/13/2015  Falls in the past year? Yes Yes  Number falls in past yr: 1 1  Injury with Fall? Yes Yes  Follow up - Falls prevention discussed   Depression Screen PHQ 2/9 Scores 04/07/2016 04/13/2015  PHQ - 2 Score 0 0     Cognitive Function     6CIT Screen 04/07/2016  What Year? 0 points  What month? 0 points  What time? 0 points  Count back from 20 0 points  Months in reverse 0 points  Repeat phrase 0 points  Total Score 0    Immunization History  Administered Date(s) Administered  . Influenza, High Dose Seasonal PF 03/04/2015, 04/07/2016  . Pneumococcal Conjugate-13 02/05/2014  . Pneumococcal Polysaccharide-23 04/07/2016  . Tdap 03/18/2008   Screening Tests Health Maintenance  Topic Date Due  . Hepatitis C Screening  05/28/2016 (Originally [birth year])  . ZOSTAVAX  04/07/2017 (Originally 03/20/2005)  . MAMMOGRAM  05/02/2017  . TETANUS/TDAP  03/18/2018  . COLONOSCOPY  05/18/2025  . INFLUENZA VACCINE  Completed  . DEXA SCAN  Completed  . PNA vac Low Risk Adult  Completed      Plan:  I have personally reviewed and addressed the Medicare Annual Wellness questionnaire and have noted the following in the patient's chart:  A. Medical and social history B. Use of alcohol, tobacco or illicit drugs  C. Current medications and supplements D. Functional ability and status E.  Nutritional status F.  Physical activity G. Advance directives H. List of other physicians I.  Hospitalizations, surgeries, and ER visits in previous 12 months J.  [place] such as hearing and vision if needed,  cognitive and depression L. Referrals and appointments - none  In addition, I have reviewed and discussed with patient certain preventive protocols, quality metrics, and best practice recommendations. A written personalized care plan for preventive services as well as general preventive health recommendations were provided to patient.  See attached scanned questionnaire for additional information.   Signed,  Fabio Neighbors, LPN Nurse Health Advisor    MD Recommendations: Patient to follow up on Hepatitis C screening. Pt will check with insurance to see about coverage for Zostavax. I  have reviewed the information as  put it by [facility] LPN and was available for any questions or concerns. [name] MD [facility]

## 2016-04-29 ENCOUNTER — Other Ambulatory Visit: Payer: Self-pay | Admitting: Family Medicine

## 2016-05-09 ENCOUNTER — Encounter: Payer: Self-pay | Admitting: Family Medicine

## 2016-05-09 ENCOUNTER — Ambulatory Visit (INDEPENDENT_AMBULATORY_CARE_PROVIDER_SITE_OTHER): Payer: Medicare Other | Admitting: Family Medicine

## 2016-05-09 VITALS — BP 124/68 | HR 74 | Temp 97.9°F | Resp 16 | Wt 130.0 lb

## 2016-05-09 DIAGNOSIS — F09 Unspecified mental disorder due to known physiological condition: Secondary | ICD-10-CM

## 2016-05-09 NOTE — Progress Notes (Signed)
Subjective:  HPI Pt is concerned about memory. Her daughter that lives in [place] called and told her that she was worried about her memory. Pt thinks that she just has a lot on her plate. She does not get lost but she has been known to go someplace else rather than where she intended to go. Pt husband admits that he sees some difference in the last year in her memory. Pt does not seem to think that she has any more memory problems that what is normal for her age. She states that she will face it if she needs to and do what she can to prevent it from getting worse. She is taking Sertraline 100 mg daily and xanax nightly for RLS.   Daughter ,son in law, and 2 grandchildren are moving back into pt house for the forseeable future. This was a huge stressor for pt a couple of years ago.  Prior to Admission medications   Medication Sig Start Date End Date Taking? Authorizing Provider  alendronate (FOSAMAX) 70 MG tablet TAKE 1 TABLET BY MOUTH  EVERY WEEK 05/01/16  Yes Richard Maceo Pro., MD  Alpha-D-Galactosidase [facility]) TABS Take 1 tablet by mouth daily.   Yes Historical Provider, MD  ALPRAZolam Duanne Moron) 0.5 MG tablet Take 1 tablet (0.5 mg total) by mouth at bedtime as needed for anxiety. 01/18/16  Yes Richard Maceo Pro., MD  Calcium Carbonate (CALCIUM 600 PO) Take 2 tablets by mouth daily.   Yes Historical Provider, MD  cholecalciferol (VITAMIN D) 400 UNITS TABS tablet Take 400 Units by mouth.   Yes Historical Provider, MD  Cholecalciferol (VITAMIN D3) 10000 units TABS Take 1 tablet by mouth daily.   Yes Historical Provider, MD  docusate sodium (COLACE) 100 MG capsule Take 100 mg by mouth 2 (two) times daily.   Yes Historical Provider, MD  levothyroxine (SYNTHROID, LEVOTHROID) 100 MCG tablet Take 1 tablet by mouth  daily 11/09/15  Yes Richard Maceo Pro., MD  MULTIPLE VITAMIN PO Take by mouth.   Yes Historical Provider, MD  Psyllium (EQ DAILY FIBER PO) Take 1 tablet by mouth daily.   Yes  Historical Provider, MD  ranitidine (ZANTAC) 150 MG tablet Take 150 mg by mouth 2 (two) times daily.   Yes Historical Provider, MD  senna (SENOKOT) 8.6 MG TABS tablet Take 1 tablet by mouth daily as needed for mild constipation.   Yes Historical Provider, MD  sertraline (ZOLOFT) 100 MG tablet Take 1 tablet by mouth  every day 01/18/16  Yes Richard Maceo Pro., MD  simethicone (MYLICON) 811 MG chewable tablet Chew 125 mg by mouth every 6 (six) hours as needed for flatulence.   Yes Historical Provider, MD  vitamin E 400 UNIT capsule Take 400 Units by mouth daily.   Yes Historical Provider, MD    Patient Active Problem List   Diagnosis Date Noted  . Colon polyps 01/06/2015  . BCC (basal cell carcinoma of skin) 01/06/2015  . Anemia 01/06/2015  . Hypothyroidism 01/06/2015  . Vitamin D deficiency 01/06/2015  . Hyperlipidemia 01/06/2015  . Depression, major 01/06/2015  . GAD (generalized anxiety disorder) 01/06/2015  . RLS (restless legs syndrome) 01/06/2015  . Cataract 01/06/2015  . Raynaud phenomenon 01/06/2015  . GERD (gastroesophageal reflux disease) 01/06/2015  . Celiac disease 01/06/2015  . OA (osteoarthritis) 01/06/2015  . Osteoporosis 01/06/2015  . Pre-diabetes 01/06/2015  . Avitaminosis D 01/06/2015  . Anxiety disorder 01/05/2015    Past Medical History:  Diagnosis  Date  . Anemia   . Anxiety   . Arthritis   . Celiac disease   . Depression   . GERD (gastroesophageal reflux disease)   . Hypothyroidism   . Pneumonia   . Raynaud's disease   . RLS (restless legs syndrome)     Social History   Social History  . Marital status: Married    Spouse name: N/A  . Number of children: N/A  . Years of education: N/A   Occupational History  . Not on file.   Social History Main Topics  . Smoking status: Never Smoker  . Smokeless tobacco: Never Used  . Alcohol use Yes     Comment: a glass of wine once in a while  . Drug use: No  . Sexual activity: Not on file   Other  Topics Concern  . Not on file   Social History Narrative  . No narrative on file    Allergies  Allergen Reactions  . Benadryl [Diphenhydramine Hcl (Sleep)]   . Advil [Ibuprofen] Rash    CAN TAKE NAPROSYN  . Claritin [Loratadine] Rash    Review of Systems  Constitutional: Negative.   HENT: Negative.   Eyes: Negative.   Respiratory: Negative.   Cardiovascular: Negative.   Gastrointestinal: Negative.   Genitourinary: Negative.   Musculoskeletal: Negative.   Skin: Negative.   Neurological: Negative.   Endo/Heme/Allergies: Negative.   Psychiatric/Behavioral: Positive for memory loss.    Immunization History  Administered Date(s) Administered  . Influenza, High Dose Seasonal PF 03/04/2015, 04/07/2016  . Pneumococcal Conjugate-13 02/05/2014  . Pneumococcal Polysaccharide-23 04/07/2016  . Tdap 03/18/2008    Objective:  BP 124/68 (BP Location: Left Arm, Patient Position: Sitting, Cuff Size: Normal)   Pulse 74   Temp 97.9 F (36.6 C) (Oral)   Resp 16   Wt 130 lb (59 kg)   BMI 23.03 kg/m   Physical Exam  Constitutional: She is oriented to person, place, and time and well-developed, well-nourished, and in no distress.  Eyes: Conjunctivae and EOM are normal. Pupils are equal, round, and reactive to light.  Neck: Normal range of motion.  Cardiovascular: Normal rate, regular rhythm, normal heart sounds and intact distal pulses.   Pulmonary/Chest: Effort normal and breath sounds normal.  Musculoskeletal: Normal range of motion.  Neurological: She is alert and oriented to person, place, and time. She has normal reflexes. No cranial nerve deficit. She exhibits normal muscle tone. Gait normal. Coordination normal. GCS score is 15.  Grossly nonfocal exam.  Skin: Skin is warm and dry.  Psychiatric: Mood, memory, affect and judgment normal.    Lab Results  Component Value Date   WBC 6.0 02/05/2014   HGB 13.5 02/05/2014   HCT 41 02/05/2014   PLT 301 02/05/2014   CHOL 201  (A) 04/07/2014   TRIG 189 (A) 04/07/2014   HDL 46 04/07/2014   LDLCALC 117 04/07/2014   TSH 3.29 02/05/2014   HGBA1C 5.6 02/05/2014    CMP     Component Value Date/Time   NA 142 02/05/2014   K 4.0 02/05/2014   BUN 10 02/05/2014   CREATININE 0.6 02/05/2014   AST 19 02/05/2014   ALT 25 02/05/2014   ALKPHOS 58 02/05/2014    Assessment and Plan :  1. Mild cognitive disorder MMSE 30/30. Normal for aging. Will refer to neurology if needed in the future. Start fish oil  I think this is mainly an adjustment reaction issue. Offer neurologic referral the patient a class this  time. 2. Chronic anxiety I think this is the major contributor to the cognitive issues worried about. She feels overwhelmed with the family moving back into her home  HPI, Exam, and A&P Transcribed under the direction and in the presence of Richard L. Cranford Mon, MD  Electronically Signed: Webb Laws, CMA I have done the exam and reviewed the above chart and it is accurate to the best of my knowledge. Development worker, community has been used in this note in any air is in the dictation or transcription are unintentional.  [facility] 05/09/2016 4:29 PM

## 2016-05-09 NOTE — Patient Instructions (Signed)
Try over the counter Fish oil.

## 2016-05-12 ENCOUNTER — Telehealth: Payer: Self-pay | Admitting: Family Medicine

## 2016-05-12 DIAGNOSIS — F411 Generalized anxiety disorder: Secondary | ICD-10-CM

## 2016-05-12 NOTE — Telephone Encounter (Signed)
I do not see anything mentioned in the last note about changing her medications. Please review-aa

## 2016-05-12 NOTE — Telephone Encounter (Signed)
Pt stated when she was in for OV on 05/09/16 it was her understanding that she was supposed to stop taking Zoloft and a Rx for Paxil. Pt wanted to see if that had been sent to [facility]. Please advise. Thanks TNP

## 2016-05-16 MED ORDER — PAROXETINE HCL 10 MG PO TABS: 10.0000 mg | ORAL_TABLET | Freq: Every day | ORAL | 3 refills | Status: DC

## 2016-05-16 NOTE — Telephone Encounter (Signed)
Med called into pharmacy. Tried to call pt. No answer, VM not set up.

## 2016-05-16 NOTE — Telephone Encounter (Signed)
Yes--stop Sertraline and start Paroxetine 51m q hs.

## 2016-05-16 NOTE — Telephone Encounter (Signed)
Pt informed and voiced understanding of results. 

## 2016-05-31 ENCOUNTER — Ambulatory Visit (INDEPENDENT_AMBULATORY_CARE_PROVIDER_SITE_OTHER): Payer: Medicare Other | Admitting: Family Medicine

## 2016-05-31 VITALS — BP 154/76 | HR 72 | Temp 98.3°F | Resp 14 | Ht 63.5 in | Wt 126.0 lb

## 2016-05-31 DIAGNOSIS — E784 Other hyperlipidemia: Secondary | ICD-10-CM | POA: Diagnosis not present

## 2016-05-31 DIAGNOSIS — E038 Other specified hypothyroidism: Secondary | ICD-10-CM | POA: Diagnosis not present

## 2016-05-31 DIAGNOSIS — J3489 Other specified disorders of nose and nasal sinuses: Secondary | ICD-10-CM

## 2016-05-31 DIAGNOSIS — Z1231 Encounter for screening mammogram for malignant neoplasm of breast: Secondary | ICD-10-CM | POA: Diagnosis not present

## 2016-05-31 DIAGNOSIS — F411 Generalized anxiety disorder: Secondary | ICD-10-CM | POA: Diagnosis not present

## 2016-05-31 DIAGNOSIS — Z0000 Encounter for general adult medical examination without abnormal findings: Secondary | ICD-10-CM | POA: Diagnosis not present

## 2016-05-31 DIAGNOSIS — R7303 Prediabetes: Secondary | ICD-10-CM | POA: Diagnosis not present

## 2016-05-31 DIAGNOSIS — E7849 Other hyperlipidemia: Secondary | ICD-10-CM

## 2016-05-31 DIAGNOSIS — K219 Gastro-esophageal reflux disease without esophagitis: Secondary | ICD-10-CM

## 2016-05-31 DIAGNOSIS — Z1239 Encounter for other screening for malignant neoplasm of breast: Secondary | ICD-10-CM

## 2016-05-31 NOTE — Progress Notes (Signed)
Patient: Sandra [name], Female    DOB: 05/02/1946, 71 y.o.   MRN: [id number] Visit Date: 05/31/2016  Today's Provider: [name], MD   Chief Complaint  Patient presents with  . Annual Exam   Subjective:  Sandra [name] is a 71 y.o. female who presents today for health maintenance and complete physical. She feels fairly well-having trouble with lingering cough the past 3 weeks. She reports exercising no routine regimen but stays active daily. She reports she is sleeping well. Immunization History  Administered Date(s) Administered  . Influenza, High Dose Seasonal PF 03/04/2015, 04/07/2016  . Pneumococcal Conjugate-13 02/05/2014  . Pneumococcal Polysaccharide-23 04/07/2016  . Tdap 03/18/2008   Last  colonoscopy 05/19/15 tubular adenoma  Pap smear 12/14/11 normal-had hysterectomy and has ovaries left  Mammogram 05/03/15  BMD 05/03/15  Review of Systems  Constitutional: Negative.   HENT: Positive for congestion, hearing loss, mouth sores, postnasal drip, rhinorrhea, sneezing, tinnitus and trouble swallowing.   Eyes: Negative.   Respiratory: Positive for cough.   Cardiovascular: Negative.   Gastrointestinal: Positive for abdominal distention and constipation.  Endocrine: Positive for cold intolerance.  Genitourinary: Negative.   Musculoskeletal: Negative.   Skin: Negative.   Allergic/Immunologic: Positive for environmental allergies.  Neurological: Negative.   Hematological: Negative.   Psychiatric/Behavioral: Negative.     Social History   Social History  . Marital status: Married    Spouse name: N/A  . Number of children: N/A  . Years of education: N/A   Occupational History  . Not on file.   Social History Main Topics  . Smoking status: Never Smoker  . Smokeless tobacco: Never Used  . Alcohol use Yes     Comment: a glass of wine once in a while  . Drug use: No  . Sexual activity: Not on file   Other Topics Concern  . Not on file   Social History  Narrative  . No narrative on file    Patient Active Problem List   Diagnosis Date Noted  . Colon polyps 01/06/2015  . BCC (basal cell carcinoma of skin) 01/06/2015  . Anemia 01/06/2015  . Hypothyroidism 01/06/2015  . Vitamin D deficiency 01/06/2015  . Hyperlipidemia 01/06/2015  . Depression, major 01/06/2015  . GAD (generalized anxiety disorder) 01/06/2015  . RLS (restless legs syndrome) 01/06/2015  . Cataract 01/06/2015  . Raynaud phenomenon 01/06/2015  . GERD (gastroesophageal reflux disease) 01/06/2015  . Celiac disease 01/06/2015  . OA (osteoarthritis) 01/06/2015  . Osteoporosis 01/06/2015  . Pre-diabetes 01/06/2015  . Avitaminosis D 01/06/2015  . Anxiety disorder 01/05/2015    Past Surgical History:  Procedure Laterality Date  . ABDOMINAL HYSTERECTOMY    . APPENDECTOMY    . CATARACT EXTRACTION Bilateral   . CESAREAN SECTION    . COLONOSCOPY WITH PROPOFOL N/A 05/19/2015   Procedure: COLONOSCOPY WITH PROPOFOL;  Surgeon: Manya Silvas, MD;  Location: [facility] ENDOSCOPY;  Service: Endoscopy;  Laterality: N/A;  . EYE SURGERY    . HAND SURGERY Left   . TONSILLECTOMY    . UPPER GI ENDOSCOPY  01/12/06   hiatus hernia    Her family history includes Alcohol abuse in her father; Anemia in her mother; Breast cancer in her maternal grandmother and sister; CAD in her paternal grandfather; Cancer in her mother; Heart attack in her maternal grandfather; Other in her mother; Prostate cancer in her father; Stroke in her maternal grandmother.     Outpatient Encounter Prescriptions as of 05/31/2016  Medication Sig Note  .  alendronate (FOSAMAX) 70 MG tablet TAKE 1 TABLET BY MOUTH  EVERY WEEK   . Alpha-D-Galactosidase (BEANO) TABS Take 1 tablet by mouth daily.   [name] ALPRAZolam (XANAX) 0.5 MG tablet Take 1 tablet (0.5 mg total) by mouth at bedtime as needed for anxiety.   . Calcium Carbonate (CALCIUM 600 PO) Take 2 tablets by mouth daily.   . Cholecalciferol (VITAMIN D3) 10000 units TABS  Take 1 tablet by mouth daily.   [name] docusate sodium (COLACE) 100 MG capsule Take 100 mg by mouth 2 (two) times daily.   [name] levothyroxine (SYNTHROID, LEVOTHROID) 100 MCG tablet Take 1 tablet by mouth  daily   . MULTIPLE VITAMIN PO Take by mouth. 07/28/2015: Received from: [place]  . PARoxetine (PAXIL) 10 MG tablet Take 1 tablet (10 mg total) by mouth at bedtime.   . Psyllium (EQ DAILY FIBER PO) Take 1 tablet by mouth daily.   . ranitidine (ZANTAC) 150 MG tablet Take 150 mg by mouth 2 (two) times daily.   [name] senna (SENOKOT) 8.6 MG TABS tablet Take 1 tablet by mouth daily as needed for mild constipation.   . simethicone (MYLICON) 016 MG chewable tablet Chew 125 mg by mouth every 6 (six) hours as needed for flatulence.   . vitamin E 400 UNIT capsule Take 400 Units by mouth daily.   . [DISCONTINUED] cholecalciferol (VITAMIN D) 400 UNITS TABS tablet Take 400 Units by mouth.    No facility-administered encounter medications on file as of 05/31/2016.     Patient Care Team: Jerrol Banana., MD as PCP - General (Family Medicine) Leandrew Koyanagi, MD as Referring Physician (Ophthalmology) Manya Silvas, MD as Consulting Physician (Gastroenterology)      Objective:   Vitals:  Vitals:   05/31/16 1050  BP: (!) 154/76  Pulse: 72  Resp: 14  Temp: 98.3 F (36.8 C)  SpO2: 98%  Weight: 126 lb (57.2 kg)  Height: 5' 3.5" (1.613 m)    Physical Exam  Constitutional: She is oriented to person, place, and time. She appears well-developed and well-nourished.  HENT:  Head: Normocephalic and atraumatic.  Right Ear: External ear normal.  Left Ear: External ear normal.  Nose: Nose normal.  Mouth/Throat: Oropharynx is clear and moist.  Eyes: Conjunctivae are normal. Pupils are equal, round, and reactive to light.  Neck: Normal range of motion. Neck supple.  Cardiovascular: Normal rate, regular rhythm, normal heart sounds and intact distal pulses.   No murmur  heard. Pulmonary/Chest: Effort normal and breath sounds normal. No respiratory distress. She has no wheezes. Right breast exhibits no inverted nipple and no mass. Left breast exhibits no inverted nipple and no mass.  Abdominal: Soft. She exhibits no distension.  Genitourinary:  Genitourinary Comments: Defer at patient request. She had partial hysterectomy. No Pap necessary, no vaginal bleeding or pelvic pain. Patient also defers rectal exam as she had colonoscopy 1 year ago.  Musculoskeletal: She exhibits no edema or tenderness.  Neurological: She is alert and oriented to person, place, and time.  Skin: Skin is warm and dry. No rash noted. No erythema.  Patient has very fair skin. There is a slight lesion on her nose that appears to be possibly an early basal cell carcinoma.  Psychiatric: She has a normal mood and affect. Her behavior is normal. Judgment and thought content normal.     Depression Screen PHQ 2/9 Scores 04/07/2016 04/13/2015  PHQ - 2 Score 0 0      Assessment &  Plan:     Routine Health Maintenance and Physical Exam  Exercise Activities and Dietary recommendations Goals    . Increase water intake          Starting 04/07/16, I will increase my water intake to 4-5 glasses a day.      1. Annual physical exam - CBC w/Diff/Platelet - Comprehensive metabolic panel  2. GAD (generalized anxiety disorder)  3. Gastroesophageal reflux disease, esophagitis presence not specified  4. Lesion of nose/Possible BCC - Ambulatory referral to Dermatology  5. Other specified hypothyroidism - TSH  6. Other hyperlipidemia - Comprehensive metabolic panel - Lipid Panel With LDL/HDL Ratio  7. Pre-diabetes - HgB A1c  8. Breast cancer screening - MM Digital Screening; Future  HPI, Exam and A&P transcribed under direction and in the presence of [name], MD.  I have done the exam and reviewed the chart and it is accurate to the best of my knowledge. Risk manager has been used and  any errors in dictation or transcription are unintentional. [name] M.D. [facility]

## 2016-06-01 ENCOUNTER — Telehealth: Payer: Self-pay

## 2016-06-01 LAB — CBC WITH DIFFERENTIAL/PLATELET
Basophils Absolute: 0 10*3/uL (ref 0.0–0.2)
Basos: 0 %
EOS (ABSOLUTE): 0.1 10*3/uL (ref 0.0–0.4)
Eos: 1 %
Hematocrit: 38.3 % (ref 34.0–46.6)
Hemoglobin: 13.2 g/dL (ref 11.1–15.9)
Immature Grans (Abs): 0 10*3/uL (ref 0.0–0.1)
Immature Granulocytes: 0 %
Lymphocytes Absolute: 1.7 10*3/uL (ref 0.7–3.1)
Lymphs: 34 %
MCH: 30 pg (ref 26.6–33.0)
MCHC: 34.5 g/dL (ref 31.5–35.7)
MCV: 87 fL (ref 79–97)
Monocytes Absolute: 0.5 10*3/uL (ref 0.1–0.9)
Monocytes: 11 %
Neutrophils Absolute: 2.7 10*3/uL (ref 1.4–7.0)
Neutrophils: 54 %
Platelets: 275 10*3/uL (ref 150–379)
RBC: 4.4 x10E6/uL (ref 3.77–5.28)
RDW: 13.9 % (ref 12.3–15.4)
WBC: 5 10*3/uL (ref 3.4–10.8)

## 2016-06-01 LAB — COMPREHENSIVE METABOLIC PANEL
ALT: 22 IU/L (ref 0–32)
AST: 19 IU/L (ref 0–40)
Albumin/Globulin Ratio: 1.6 (ref 1.2–2.2)
Albumin: 4.1 g/dL (ref 3.5–4.8)
Alkaline Phosphatase: 66 IU/L (ref 39–117)
BUN/Creatinine Ratio: 20 (ref 12–28)
BUN: 12 mg/dL (ref 8–27)
Bilirubin Total: 0.4 mg/dL (ref 0.0–1.2)
CO2: 26 mmol/L (ref 18–29)
Calcium: 8.9 mg/dL (ref 8.7–10.3)
Chloride: 100 mmol/L (ref 96–106)
Creatinine, Ser: 0.6 mg/dL (ref 0.57–1.00)
GFR calc Af Amer: 106 mL/min/{1.73_m2} (ref >59)
GFR calc non Af Amer: 92 mL/min/{1.73_m2} (ref >59)
Globulin, Total: 2.6 g/dL (ref 1.5–4.5)
Glucose: 98 mg/dL (ref 65–99)
Potassium: 4.3 mmol/L (ref 3.5–5.2)
Sodium: 139 mmol/L (ref 134–144)
Total Protein: 6.7 g/dL (ref 6.0–8.5)

## 2016-06-01 LAB — HEMOGLOBIN A1C
Est. average glucose Bld gHb Est-mCnc: 114 mg/dL
Hgb A1c MFr Bld: 5.6 % (ref 4.8–5.6)

## 2016-06-01 LAB — LIPID PANEL WITH LDL/HDL RATIO
Cholesterol, Total: 180 mg/dL (ref 100–199)
HDL: 43 mg/dL (ref >39)
LDL Calculated: 106 mg/dL — ABNORMAL HIGH (ref 0–99)
LDl/HDL Ratio: 2.5 ratio units (ref 0.0–3.2)
Triglycerides: 155 mg/dL — ABNORMAL HIGH (ref 0–149)
VLDL Cholesterol Cal: 31 mg/dL (ref 5–40)

## 2016-06-01 LAB — TSH: TSH: 3.35 u[IU]/mL (ref 0.450–4.500)

## 2016-06-01 NOTE — Telephone Encounter (Signed)
-----   Message from Jerrol Banana., MD sent at 06/01/2016  1:43 PM EST ----- Labs okay.

## 2016-06-01 NOTE — Telephone Encounter (Signed)
Advised pt of lab results. Pt verbally acknowledges understanding. Philemon Riedesel Drozdowski, CMA   

## 2016-06-09 DIAGNOSIS — L570 Actinic keratosis: Secondary | ICD-10-CM | POA: Diagnosis not present

## 2016-07-31 DIAGNOSIS — H40003 Preglaucoma, unspecified, bilateral: Secondary | ICD-10-CM | POA: Diagnosis not present

## 2016-08-02 ENCOUNTER — Other Ambulatory Visit: Payer: Self-pay | Admitting: Emergency Medicine

## 2016-08-02 DIAGNOSIS — N952 Postmenopausal atrophic vaginitis: Secondary | ICD-10-CM

## 2016-08-11 ENCOUNTER — Ambulatory Visit
Admission: RE | Admit: 2016-08-11 | Discharge: 2016-08-11 | Disposition: A | Discharge status: Home / Self Care | Payer: Medicare Other | Source: Ambulatory Visit | Attending: Family Medicine | Admitting: Family Medicine

## 2016-08-11 DIAGNOSIS — Z1239 Encounter for other screening for malignant neoplasm of breast: Secondary | ICD-10-CM

## 2016-08-11 DIAGNOSIS — Z1231 Encounter for screening mammogram for malignant neoplasm of breast: Secondary | ICD-10-CM | POA: Diagnosis not present

## 2016-08-24 ENCOUNTER — Encounter: Payer: Self-pay | Admitting: Obstetrics and Gynecology

## 2016-08-24 ENCOUNTER — Ambulatory Visit (INDEPENDENT_AMBULATORY_CARE_PROVIDER_SITE_OTHER): Payer: Medicare Other | Admitting: Obstetrics and Gynecology

## 2016-08-24 VITALS — BP 144/83 | HR 67 | Ht 63.0 in | Wt 131.4 lb

## 2016-08-24 DIAGNOSIS — N952 Postmenopausal atrophic vaginitis: Secondary | ICD-10-CM

## 2016-08-24 DIAGNOSIS — N941 Unspecified dyspareunia: Secondary | ICD-10-CM | POA: Diagnosis not present

## 2016-08-24 DIAGNOSIS — N993 Prolapse of vaginal vault after hysterectomy: Secondary | ICD-10-CM

## 2016-08-24 MED ORDER — ESTROGENS, CONJUGATED 0.625 MG/GM VA CREA: 1.0000 | TOPICAL_CREAM | Freq: Every day | VAGINAL | 4 refills | Status: DC

## 2016-08-24 NOTE — Progress Notes (Signed)
GYNECOLOGY PROGRESS NOTE  Subjective:    Patient ID: Sandra [name], female    DOB: 09-27-1945, 71 y.o.   MRN: [id number]  HPI  Patient is a 71 y.o. [id number] female who presents as a referral from [facility] for complaints of vaginal atrophy.  Notes that is currently married, but has not been sexually active in almost 10 years due to discomfort.  States that she feels as though "everything has closed up".  Desires to be active again.  The patient has a remote h/o HRT use, however discontinued after several years due to fear of risks of breast cancer (notes family history in [facility] and sister).   Past Medical History:  Diagnosis Date  . Anemia   . Anxiety   . Arthritis   . Celiac disease   . Depression   . GERD (gastroesophageal reflux disease)   . Hypothyroidism   . Pneumonia   . Raynaud's disease   . RLS (restless legs syndrome)     Family History  Problem Relation Age of Onset  . Cancer Mother   . Anemia Mother   . Other Mother     lymphosarcoma  . Alcohol abuse Father   . Prostate cancer Father   . Breast cancer Sister   . Breast cancer Maternal Grandmother   . Stroke Maternal Grandmother   . Heart attack Maternal Grandfather   . CAD Paternal Grandfather     Past Surgical History:  Procedure Laterality Date  . ABDOMINAL HYSTERECTOMY    . APPENDECTOMY    . CATARACT EXTRACTION Bilateral   . CESAREAN SECTION    . COLONOSCOPY WITH PROPOFOL N/A 05/19/2015   Procedure: COLONOSCOPY WITH PROPOFOL;  Surgeon: Manya Silvas, MD;  Location: [facility] ENDOSCOPY;  Service: Endoscopy;  Laterality: N/A;  . EYE SURGERY    . HAND SURGERY Left   . TONSILLECTOMY    . UPPER GI ENDOSCOPY  01/12/06   hiatus hernia    Social History   Social History  . Marital status: Married    Spouse name: N/A  . Number of children: N/A  . Years of education: N/A   Occupational History  . Not on file.   Social History Main Topics  . Smoking status: Never Smoker  .  Smokeless tobacco: Never Used  . Alcohol use Yes     Comment: a glass of wine once in a while  . Drug use: No  . Sexual activity: Not Currently    Birth control/ protection: None   Other Topics Concern  . Not on file   Social History Narrative  . No narrative on file    Current Outpatient Prescriptions on File Prior to Visit  Medication Sig Dispense Refill  . alendronate (FOSAMAX) 70 MG tablet TAKE 1 TABLET BY MOUTH  EVERY WEEK 12 tablet 3  . ALPRAZolam (XANAX) 0.5 MG tablet Take 1 tablet (0.5 mg total) by mouth at bedtime as needed for anxiety. 90 tablet 1  . Calcium Carbonate (CALCIUM 600 PO) Take 2 tablets by mouth daily.    . Cholecalciferol (VITAMIN D3) 10000 units TABS Take 1 tablet by mouth daily.    [name] docusate sodium (COLACE) 100 MG capsule Take 100 mg by mouth 2 (two) times daily.    [name] levothyroxine (SYNTHROID, LEVOTHROID) 100 MCG tablet Take 1 tablet by mouth  daily 90 tablet 3  . MULTIPLE VITAMIN PO Take by mouth.    [name] PARoxetine (PAXIL) 10 MG tablet Take 1 tablet (10  mg total) by mouth at bedtime. 90 tablet 3  . Psyllium (EQ DAILY FIBER PO) Take 1 tablet by mouth daily.    [name] senna (SENOKOT) 8.6 MG TABS tablet Take 1 tablet by mouth daily as needed for mild constipation.     No current facility-administered medications on file prior to visit.     Allergies  Allergen Reactions  . Benadryl [Diphenhydramine Hcl (Sleep)]   . Advil [Ibuprofen] Rash    CAN TAKE NAPROSYN  . Claritin [Loratadine] Rash     Review of Systems Pertinent items noted in HPI and remainder of comprehensive ROS otherwise negative.   Objective:   Blood pressure (!) 144/83, pulse 67, height 5' 3"  (1.6 m), weight 131 lb 6.4 oz (59.6 kg). General appearance: alert and no distress Abdomen: soft, non-tender; bowel sounds normal; no masses,  no organomegaly Pelvic: external genitalia normal.  Vagina with atrophy, no discharge or lesions, Grade 1 vaginal vault prolapse.  Uterus and cervix surgically  absent.  Adnexae non-palpable, non-tender.   Extremities: non-tender.  No edema, cyanosis, or erythema.  Neurologic: Alert and oriented X 3, normal strength and tone. Normal symmetric reflexes. Normal coordination and gait   Assessment:   Vaginal atrophy Dyspareunia Vaginal vault prolapse  Plan:   - Discussed management options for vaginal atrophy and dyspareunia, including local hormonal therapy (estrogen cream/vaginal ring/vaginal tablets, testosterone tablets, vaginal moisturizers and lubricants.  Discussed risks vs benefits of all methods.  Advised that the risk of breast cancer from more localized hormonal therapy was very low.  Patient ok to begin local estrogen therapy. Patient initially desired to try the vaginal ring, however was uncomfortable during pelvic exam so recommendations are to begin with vaginal cream (daily x 14 days, then decrease to twice weekly).  If after 2-3 months of therapy there has been improvement, can consider switching over to vaginal ring.  - Vaginal vault prolapse mild, relatively asymptomatic. No treatment required at this time.  - RTC in 4-6 weeks to f/u symptoms.   Rubie Maid, MD [facility]

## 2016-09-13 ENCOUNTER — Other Ambulatory Visit: Payer: Self-pay | Admitting: Family Medicine

## 2016-09-13 DIAGNOSIS — F419 Anxiety disorder, unspecified: Secondary | ICD-10-CM

## 2016-09-13 NOTE — Telephone Encounter (Signed)
[facility] faxed a request on the following medication. Thanks CC  ALPRAZolam (XANAX) 0.5 MG tablet

## 2016-09-13 NOTE — Telephone Encounter (Signed)
Please review-aa 

## 2016-09-14 NOTE — Telephone Encounter (Signed)
Ok--4 rf.

## 2016-09-18 ENCOUNTER — Ambulatory Visit (INDEPENDENT_AMBULATORY_CARE_PROVIDER_SITE_OTHER): Payer: Medicare Other | Admitting: Family Medicine

## 2016-09-18 ENCOUNTER — Encounter: Payer: Self-pay | Admitting: General Surgery

## 2016-09-18 VITALS — BP 154/82 | HR 80 | Temp 97.8°F | Resp 14 | Wt 132.0 lb

## 2016-09-18 DIAGNOSIS — N63 Unspecified lump in unspecified breast: Secondary | ICD-10-CM | POA: Diagnosis not present

## 2016-09-18 DIAGNOSIS — F411 Generalized anxiety disorder: Secondary | ICD-10-CM

## 2016-09-18 NOTE — Progress Notes (Signed)
Sandra [name]  MRN: [id number] DOB: Dec 05, 1945  Subjective:  HPI  Patient is here to discuss breast knot. She noticed redness and a knot near right nipple area. She has not had any pain or discomfort at that time, redness went away but knot still there and Saturday 09/16/16 developed pain/soreness where the knot is near right nipple. Soreness present sometimes even with not touching the area. She has family history of breast cancer. She did have mammogram on 08/11/16 and it was negative. Patient Active Problem List   Diagnosis Date Noted  . Prolapse of vaginal vault after hysterectomy 08/24/2016  . Vaginal atrophy 08/24/2016  . Dyspareunia in female 08/24/2016  . Colon polyps 01/06/2015  . BCC (basal cell carcinoma of skin) 01/06/2015  . Anemia 01/06/2015  . Hypothyroidism 01/06/2015  . Vitamin D deficiency 01/06/2015  . Hyperlipidemia 01/06/2015  . Depression, major 01/06/2015  . GAD (generalized anxiety disorder) 01/06/2015  . RLS (restless legs syndrome) 01/06/2015  . Cataract 01/06/2015  . Raynaud phenomenon 01/06/2015  . GERD (gastroesophageal reflux disease) 01/06/2015  . Celiac disease 01/06/2015  . OA (osteoarthritis) 01/06/2015  . Osteoporosis 01/06/2015  . Pre-diabetes 01/06/2015  . Avitaminosis D 01/06/2015  . Anxiety disorder 01/05/2015    Past Medical History:  Diagnosis Date  . Anemia   . Anxiety   . Arthritis   . Celiac disease   . Depression   . GERD (gastroesophageal reflux disease)   . Hypothyroidism   . Pneumonia   . Raynaud's disease   . RLS (restless legs syndrome)     Social History   Social History  . Marital status: Married    Spouse name: N/A  . Number of children: N/A  . Years of education: N/A   Occupational History  . Not on file.   Social History Main Topics  . Smoking status: Never Smoker  . Smokeless tobacco: Never Used  . Alcohol use Yes     Comment: a glass of wine once in a while  . Drug use: No  . Sexual activity:  Not Currently    Birth control/ protection: None   Other Topics Concern  . Not on file   Social History Narrative  . No narrative on file    Outpatient Encounter Prescriptions as of 09/18/2016  Medication Sig Note  . alendronate (FOSAMAX) 70 MG tablet TAKE 1 TABLET BY MOUTH  EVERY WEEK   . ALPRAZolam (XANAX) 0.5 MG tablet Take 1 tablet (0.5 mg total) by mouth at bedtime as needed for anxiety.   . Calcium Carbonate (CALCIUM 600 PO) Take 2 tablets by mouth daily.   . Cholecalciferol (VITAMIN D3) 10000 units TABS Take 1 tablet by mouth daily.   [name] conjugated estrogens (PREMARIN) vaginal cream Place 1 Applicatorful vaginally daily. Use 1/2 gram every day x 12 weeks, then decrease to twice weekly   . docusate sodium (COLACE) 100 MG capsule Take 100 mg by mouth 2 (two) times daily.   [name] levothyroxine (SYNTHROID, LEVOTHROID) 100 MCG tablet TAKE 1 TABLET BY MOUTH  DAILY   . MULTIPLE VITAMIN PO Take by mouth. 07/28/2015: Received from: [place]  . Omega-3 Fatty Acids (FISH OIL PO) Take by mouth.   [name] PARoxetine (PAXIL) 10 MG tablet Take 1 tablet (10 mg total) by mouth at bedtime.   . Probiotic Product (PROBIOTIC DAILY PO) Take by mouth.   . Psyllium (EQ DAILY FIBER PO) Take 1 tablet by mouth daily.   [name] senna (SENOKOT) 8.6  MG TABS tablet Take 1 tablet by mouth daily as needed for mild constipation.    No facility-administered encounter medications on file as of 09/18/2016.     Allergies  Allergen Reactions  . Benadryl [Diphenhydramine Hcl (Sleep)]   . Advil [Ibuprofen] Rash    CAN TAKE NAPROSYN  . Claritin [Loratadine] Rash    Review of Systems  Constitutional: Negative.   Respiratory: Negative.   Cardiovascular: Negative.   Musculoskeletal: Negative.   Skin:       Knot near right nipple  Psychiatric/Behavioral: Positive for depression. The patient is nervous/anxious.     Objective:  BP (!) 154/82   Pulse 80   Temp 97.8 F (36.6 C)   Resp 14   Wt 132 lb (59.9  kg)   BMI 23.38 kg/m   Physical Exam  Constitutional: She is well-developed, well-nourished, and in no distress.  HENT:  Head: Normocephalic and atraumatic.  Eyes: Conjunctivae are normal. Pupils are equal, round, and reactive to light.  Neck: Normal range of motion. Neck supple.  Cardiovascular: Normal rate, regular rhythm, normal heart sounds and intact distal pulses.   No murmur heard. Pulmonary/Chest: Effort normal and breath sounds normal. No respiratory distress. She has no wheezes. Right breast exhibits no inverted nipple and no nipple discharge. Left breast exhibits tenderness. Left breast exhibits no inverted nipple and no nipple discharge.  8 mm x 6 mm nodule at 10 o'clock at the right areola.    Assessment and Plan :  1. Breast nodule I think this is benign and had normal mammogra on 08/11/16. - Ambulatory referral to General Surgery  2. GAD (generalized anxiety disorder)  I have done the exam and reviewed the chart and it is accurate to the best of my knowledge. Development worker, community has been used and  any errors in dictation or transcription are unintentional. [name] M.D. [facility]

## 2016-09-19 MED ORDER — ALPRAZOLAM 0.5 MG PO TABS: 0.5000 mg | ORAL_TABLET | Freq: Every evening | ORAL | 1 refills | Status: DC | PRN

## 2016-09-20 ENCOUNTER — Telehealth: Payer: Self-pay | Admitting: Obstetrics and Gynecology

## 2016-09-20 NOTE — Telephone Encounter (Signed)
Patient contacted and instructed to keep appointment as scheduled 10/03/2016

## 2016-09-20 NOTE — Telephone Encounter (Signed)
Pt should still keep this appointment because she was given samples so we still need to see how she is doing. Thanks

## 2016-09-20 NOTE — Telephone Encounter (Signed)
Patient has FU appt on 10/03/2016 - she completed the sample medication and didn't receive her script through the mail yet - they are overnighting it to her today and she will restart tomorrow - do you want her to keep the 05/08 appointment or RS out for a few weeks?? Please call

## 2016-09-28 ENCOUNTER — Ambulatory Visit (INDEPENDENT_AMBULATORY_CARE_PROVIDER_SITE_OTHER): Payer: Medicare Other | Admitting: Family Medicine

## 2016-09-28 VITALS — BP 148/82 | HR 66 | Temp 98.4°F | Resp 16 | Wt 133.0 lb

## 2016-09-28 DIAGNOSIS — F411 Generalized anxiety disorder: Secondary | ICD-10-CM | POA: Diagnosis not present

## 2016-09-28 DIAGNOSIS — N993 Prolapse of vaginal vault after hysterectomy: Secondary | ICD-10-CM

## 2016-09-28 DIAGNOSIS — K900 Celiac disease: Secondary | ICD-10-CM | POA: Diagnosis not present

## 2016-09-28 DIAGNOSIS — F439 Reaction to severe stress, unspecified: Secondary | ICD-10-CM

## 2016-09-28 DIAGNOSIS — E038 Other specified hypothyroidism: Secondary | ICD-10-CM

## 2016-09-28 MED ORDER — SERTRALINE HCL 100 MG PO TABS: 100.0000 mg | ORAL_TABLET | Freq: Every day | ORAL | 3 refills | Status: DC

## 2016-09-28 NOTE — Patient Instructions (Signed)
Try to seek counseling through church. I hope you feel much better on your next appointment. Good luck. I pray that everything gets more in order for you. Take care of yourself! :)

## 2016-09-28 NOTE — Progress Notes (Signed)
Sandra [name]  MRN: [id number] DOB: 06-26-1945  Subjective:  HPI  Patient is here for follow up. Last office visit was on 09/18/16 for acute issue-breast lump. The time before that office visit was on 05/31/16 for CPE and routine labs were done at that time. Depression/anxiety: patient is here to follow up on Paxil. This was switched in December 2017 from Zoloft and she is not sure why we switched it. She feels worse on Paxil very exhausted even though she sleeps long enough, also feels more irritated. She is stressed she admits, she feels overwhelmed and wishes she could just cry and release all those emotions but she can not get it out. Depression screen [facility] 2/9 04/07/2016 04/13/2015  Decreased Interest 0 0  Down, Depressed, Hopeless 0 0  PHQ - 2 Score 0 0   Patient is also concerned with weight gain, 1 lb from last wek and a . Patient states she is eating at night more due to stress. Usually tries to eat small portions. She does have constipation and has been taking stool softeners, took fiber, Align, and laxative 2 nights ago. She does have Celiac disease.  Wt Readings from Last 3 Encounters:  09/28/16 133 lb (60.3 kg)  09/18/16 132 lb (59.9 kg)  08/24/16 131 lb 6.4 oz (59.6 kg)   Patient Active Problem List   Diagnosis Date Noted  . Prolapse of vaginal vault after hysterectomy 08/24/2016  . Vaginal atrophy 08/24/2016  . Dyspareunia in female 08/24/2016  . Colon polyps 01/06/2015  . BCC (basal cell carcinoma of skin) 01/06/2015  . Anemia 01/06/2015  . Hypothyroidism 01/06/2015  . Vitamin D deficiency 01/06/2015  . Hyperlipidemia 01/06/2015  . Depression, major 01/06/2015  . GAD (generalized anxiety disorder) 01/06/2015  . RLS (restless legs syndrome) 01/06/2015  . Cataract 01/06/2015  . Raynaud phenomenon 01/06/2015  . GERD (gastroesophageal reflux disease) 01/06/2015  . Celiac disease 01/06/2015  . OA (osteoarthritis) 01/06/2015  . Osteoporosis 01/06/2015  .  Pre-diabetes 01/06/2015  . Avitaminosis D 01/06/2015  . Raynaud's syndrome without gangrene 01/06/2015  . Age-related osteoporosis without current pathological fracture 01/06/2015  . Anxiety disorder 01/05/2015    Past Medical History:  Diagnosis Date  . Anemia   . Anxiety   . Arthritis   . Celiac disease   . Depression   . GERD (gastroesophageal reflux disease)   . Hypothyroidism   . Pneumonia   . Raynaud's disease   . RLS (restless legs syndrome)     Social History   Social History  . Marital status: Married    Spouse name: N/A  . Number of children: N/A  . Years of education: N/A   Occupational History  . Not on file.   Social History Main Topics  . Smoking status: Never Smoker  . Smokeless tobacco: Never Used  . Alcohol use Yes     Comment: a glass of wine once in a while  . Drug use: No  . Sexual activity: Not Currently    Birth control/ protection: None   Other Topics Concern  . Not on file   Social History Narrative  . No narrative on file    Outpatient Encounter Prescriptions as of 09/28/2016  Medication Sig Note  . alendronate (FOSAMAX) 70 MG tablet TAKE 1 TABLET BY MOUTH  EVERY WEEK   . ALPRAZolam (XANAX) 0.5 MG tablet Take 1 tablet (0.5 mg total) by mouth at bedtime as needed for anxiety.   . Calcium Carbonate (CALCIUM 600 PO)  Take 2 tablets by mouth daily.   . Cholecalciferol (VITAMIN D3) 10000 units TABS Take 1 tablet by mouth daily.   [name] conjugated estrogens (PREMARIN) vaginal cream Place 1 Applicatorful vaginally daily. Use 1/2 gram every day x 12 weeks, then decrease to twice weekly   . docusate sodium (COLACE) 100 MG capsule Take 100 mg by mouth 2 (two) times daily.   [name] levothyroxine (SYNTHROID, LEVOTHROID) 100 MCG tablet TAKE 1 TABLET BY MOUTH  DAILY   . MULTIPLE VITAMIN PO Take by mouth. 07/28/2015: Received from: [place]  . Omega-3 Fatty Acids (FISH OIL PO) Take by mouth.   [name] PARoxetine (PAXIL) 10 MG tablet Take 1 tablet  (10 mg total) by mouth at bedtime.   . Probiotic Product (PROBIOTIC DAILY PO) Take by mouth.   . Psyllium (EQ DAILY FIBER PO) Take 1 tablet by mouth daily.   [name] senna (SENOKOT) 8.6 MG TABS tablet Take 1 tablet by mouth daily as needed for mild constipation.    No facility-administered encounter medications on file as of 09/28/2016.     Allergies  Allergen Reactions  . Benadryl [Diphenhydramine Hcl (Sleep)]   . Advil [Ibuprofen] Rash    CAN TAKE NAPROSYN  . Claritin [Loratadine] Rash    Review of Systems  Constitutional: Positive for malaise/fatigue.  Eyes:       Visual disturbance  Respiratory: Negative.   Cardiovascular: Negative.   Gastrointestinal: Positive for constipation.       Abdominal bloated.  Musculoskeletal: Negative.   Psychiatric/Behavioral: Positive for depression. The patient is nervous/anxious.        Overwhelmed, stressed    Objective:  BP (!) 148/82   Pulse 66   Temp 98.4 F (36.9 C)   Resp 16   Wt 133 lb (60.3 kg)   BMI 23.56 kg/m   Physical Exam  Constitutional: She is oriented to person, place, and time and well-developed, well-nourished, and in no distress.  HENT:  Head: Normocephalic and atraumatic.  Eyes: Conjunctivae are normal. Pupils are equal, round, and reactive to light.  Neck: Normal range of motion. Neck supple.  Cardiovascular: Normal rate, regular rhythm, normal heart sounds and intact distal pulses.  Exam reveals no gallop.   No murmur heard. Pulmonary/Chest: Effort normal and breath sounds normal. No respiratory distress. She has no wheezes.  Neurological: She is alert and oriented to person, place, and time.  Psychiatric:  overwhelmed   Assessment and Plan :  1. GAD (generalized anxiety disorder) Worse on Paxil. Switch back to Zoloft. Discussed with patient seeking counseling through church, I think this will be helpful. Also discussed with patient making changes in her life to help decrease the stress in her life. Discussed  with patient that the weight gain is from Paxil, will follow. 2. Celiac disease 3. Situational stress Discussed this in detail today, see above plan. 4. Other specified hypothyroidism  5. Prolapse of vaginal vault after hysterectomy Seen Dr Marcelline Mates, on Premarin right now but is an issue with cost and advised patient to check with Dr Marcelline Mates on possible change in her medication management for this.  HPI, Exam and A&P transcribed by Theressa Millard, RMA under direction and in the presence of [name], MD. I have done the exam and reviewed the chart and it is accurate to the best of my knowledge. Development worker, community has been used and  any errors in dictation or transcription are unintentional. [name] M.D. [facility]

## 2016-10-02 ENCOUNTER — Encounter: Payer: Self-pay | Admitting: General Surgery

## 2016-10-02 ENCOUNTER — Inpatient Hospital Stay: Payer: Self-pay

## 2016-10-02 ENCOUNTER — Ambulatory Visit (INDEPENDENT_AMBULATORY_CARE_PROVIDER_SITE_OTHER): Payer: Medicare Other | Admitting: General Surgery

## 2016-10-02 VITALS — BP 128/74 | HR 74 | Resp 12 | Ht 63.0 in | Wt 132.0 lb

## 2016-10-02 DIAGNOSIS — N63 Unspecified lump in unspecified breast: Secondary | ICD-10-CM

## 2016-10-02 MED ORDER — DOXYCYCLINE MONOHYDRATE 100 MG PO CAPS: 100.0000 mg | ORAL_CAPSULE | Freq: Two times a day (BID) | ORAL | 0 refills | Status: DC

## 2016-10-02 NOTE — Progress Notes (Signed)
Patient ID: Sandra [name], female   DOB: March 22, 1946, 71 y.o.   MRN: [id number]  Chief Complaint  Patient presents with  . Other    Right breast nodule    HPI [name] is a 71 y.o. female who presents for a breast evaluation. The most recent mammogram was done on 08/11/16.  Patient does not perform regular self breast checks and gets regular mammograms done. The patient states she has a right breast nodule. She states was red and Hard at first. She first noticed this about a month ago during an exam with Dr. Rosanna Randy. No redness currently. Denies drainage.She does have a family history of breast cancer.  Denies injury to the breast.   HPI  Past Medical History:  Diagnosis Date  . Anemia   . Anxiety   . Arthritis   . Celiac disease   . Depression   . GERD (gastroesophageal reflux disease)   . Hypothyroidism   . Pneumonia   . Raynaud's disease   . RLS (restless legs syndrome)     Past Surgical History:  Procedure Laterality Date  . ABDOMINAL HYSTERECTOMY    . APPENDECTOMY    . CATARACT EXTRACTION Bilateral   . CESAREAN SECTION    . COLONOSCOPY WITH PROPOFOL N/A 05/19/2015   Procedure: COLONOSCOPY WITH PROPOFOL;  Surgeon: Manya Silvas, MD;  Location: [facility] ENDOSCOPY;  Service: Endoscopy;  Laterality: N/A;  . EYE SURGERY    . HAND SURGERY Left   . TONSILLECTOMY    . UPPER GI ENDOSCOPY  01/12/06   hiatus hernia    Family History  Problem Relation Age of Onset  . Cancer Mother   . Anemia Mother   . Other Mother     lymphosarcoma  . Alcohol abuse Father   . Prostate cancer Father   . Breast cancer Sister     mid 71's  . Breast cancer Maternal Grandmother   . Stroke Maternal Grandmother   . Heart attack Maternal Grandfather   . CAD Paternal Grandfather     Social History Social History  Substance Use Topics  . Smoking status: Never Smoker  . Smokeless tobacco: Never Used  . Alcohol use Yes     Comment: a glass of wine once in a while    Allergies   Allergen Reactions  . Benadryl [Diphenhydramine Hcl (Sleep)]   . Advil [Ibuprofen] Rash    CAN TAKE NAPROSYN  . Claritin [Loratadine] Rash    Current Outpatient Prescriptions  Medication Sig Dispense Refill  . alendronate (FOSAMAX) 70 MG tablet TAKE 1 TABLET BY MOUTH  EVERY WEEK 12 tablet 3  . ALPRAZolam (XANAX) 0.5 MG tablet Take 1 tablet (0.5 mg total) by mouth at bedtime as needed for anxiety. 90 tablet 1  . Calcium Carbonate (CALCIUM 600 PO) Take 2 tablets by mouth daily.    . Cholecalciferol (VITAMIN D3) 10000 units TABS Take 1 tablet by mouth daily.    [name] conjugated estrogens (PREMARIN) vaginal cream Place 1 Applicatorful vaginally daily. Use 1/2 gram every day x 12 weeks, then decrease to twice weekly 42.5 g 4  . docusate sodium (COLACE) 100 MG capsule Take 100 mg by mouth 2 (two) times daily.    [name] levothyroxine (SYNTHROID, LEVOTHROID) 100 MCG tablet TAKE 1 TABLET BY MOUTH  DAILY 90 tablet 3  . MULTIPLE VITAMIN PO Take by mouth.    . Omega-3 Fatty Acids (FISH OIL PO) Take by mouth.    . Probiotic Product (PROBIOTIC DAILY PO)  Take by mouth.    . Psyllium (EQ DAILY FIBER PO) Take 1 tablet by mouth daily.    [name] senna (SENOKOT) 8.6 MG TABS tablet Take 1 tablet by mouth daily as needed for mild constipation.    . sertraline (ZOLOFT) 100 MG tablet Take 1 tablet (100 mg total) by mouth daily. 90 tablet 3  . doxycycline (MONODOX) 100 MG capsule Take 1 capsule (100 mg total) by mouth 2 (two) times daily. 20 capsule 0   No current facility-administered medications for this visit.     Review of Systems Review of Systems  Constitutional: Negative.   Respiratory: Negative.   Cardiovascular: Negative.     Blood pressure 128/74, pulse 74, resp. rate 12, height 5' 3"  (1.6 m), weight 132 lb (59.9 kg).  Physical Exam Physical Exam  Constitutional: She is oriented to person, place, and time. She appears well-developed and well-nourished.  Eyes: Conjunctivae are normal. No scleral icterus.   Neck: Neck supple. No thyromegaly present.  Cardiovascular: Normal rate and regular rhythm.   Murmur heard.  Systolic murmur is present with a grade of 2/6  Pulmonary/Chest: Effort normal and breath sounds normal. Right breast exhibits no inverted nipple, no mass, no nipple discharge, no skin change and no tenderness. Left breast exhibits no inverted nipple, no mass, no nipple discharge, no skin change and no tenderness.    Right breast nodule  Lymphadenopathy:    She has no cervical adenopathy.    She has no axillary adenopathy.  Neurological: She is alert and oriented to person, place, and time.  Skin: Skin is warm and dry.    Data Reviewed 08/11/2016 bilateral screening mammograms reviewed. BIRAD-1. Retrospective analysis shows no discernible abnormality to correlate with the present clinical finding.  Ultrasound examination of the right retroareolar area in the 10:00 position showed an ill-defined hypoechoic mass measuring 0.87 x 0.8 x 1.47 cm. This was immediately adjacent to the edge of the nipple. Faint acoustic enhancement was noted. Smooth borders. BIRAD-3.  The patient was amenable to FNA sampling. This was completed using 1 mL of 1% plain Xylocaine. Multiple passes through the lesion were completed with a 22-gauge needle. This was well tolerated. Slides 2 were prepared for cytology.  Assessment    Right breast mass likely related to cutaneous inflammatory process.    Plan    The patient will be placed on oral antibiotic for 10 days. The importance of avoiding sun exposure was reviewed. Take Doxycycline for 10 days.   Will call with FNA results. We'll plan for reassessment in 2 weeks.  HPI, Physical Exam, Assessment and Plan have been scribed under the direction and in the presence of Hervey Ard, MD.  Verlene Mayer, CMA   I have completed the exam and reviewed the above documentation for accuracy and completeness.  I agree with the above.  Haematologist has  been used and any errors in dictation or transcription are unintentional.  Hervey Ard, M.D., F.A.C.S.         Robert Bellow 10/03/2016, 8:49 PM

## 2016-10-02 NOTE — Patient Instructions (Addendum)
Take Doxycycline for 10 days.   Will call with FNA results

## 2016-10-03 ENCOUNTER — Ambulatory Visit (INDEPENDENT_AMBULATORY_CARE_PROVIDER_SITE_OTHER): Payer: Medicare Other | Admitting: Obstetrics and Gynecology

## 2016-10-03 VITALS — BP 156/76 | HR 74 | Ht 63.0 in | Wt 132.0 lb

## 2016-10-03 DIAGNOSIS — N63 Unspecified lump in unspecified breast: Secondary | ICD-10-CM | POA: Insufficient documentation

## 2016-10-03 DIAGNOSIS — N952 Postmenopausal atrophic vaginitis: Secondary | ICD-10-CM

## 2016-10-03 DIAGNOSIS — R030 Elevated blood-pressure reading, without diagnosis of hypertension: Secondary | ICD-10-CM

## 2016-10-03 DIAGNOSIS — D241 Benign neoplasm of right breast: Secondary | ICD-10-CM | POA: Insufficient documentation

## 2016-10-03 NOTE — Progress Notes (Signed)
    GYNECOLOGY PROGRESS NOTE  Subjective:    Patient ID: Sandra [name], female    DOB: 11/04/1945, 71 y.o.   MRN: [id number]  HPI  Patient is a 71 y.o. [id number] female who presents for 6 week f/u of vaginal atrophy.  Patient notes that she was unable to be compliant with medication as it took almost 4 weeks for her medication to arrive from mail delivery. Did use the samples for 2 weeks prior to arrival for medication.  Is noting that the cost was very expensive ($250/tube), and is having a little difficulty with using the applicator (notes that the vaginal region gets sore).    The following portions of the patient's history were reviewed and updated as appropriate: allergies, current medications, past family history, past medical history, past social history, past surgical history and problem list.  Review of Systems A comprehensive review of systems was negative except for: Gastrointestinal: positive for bloating Integument/breast: positive for breast lump and has been seen and evaluated by PCP and General Surgery, notes possible infection, was given antibiotics.    Objective:   Blood pressure (!) 156/76, pulse 74, height 5' 3"  (1.6 m), weight 132 lb (59.9 kg). General appearance: alert and no distress  Remainder of exam deferred.  Assessment:   Vaginal atrophy Elevated BP  Plan:   - Discussion had with patient to continue use of Premarin cream, can decrease to twice weekly after 2 more weeks of use.  Discussed methods to ease insertion.   To return in 6 additional weeks for evaluation of vaginal atrophy on meds.   - Elevated BP, patient notes that over the past several doctor visits she has noticed an elevation in her blood pressure.  Discussed that she may be newly diagnosed hypertensive.  Discussed low-sodium diet, regular exercise.  Will need to be further evaluated by PCP.    Rubie Maid, MD [facility]

## 2016-10-04 LAB — FINE-NEEDLE ASPIRATION

## 2016-10-07 ENCOUNTER — Telehealth: Payer: Self-pay | Admitting: General Surgery

## 2016-10-07 NOTE — Telephone Encounter (Signed)
Notified atypical cells identified on FNA. Patient reports soreness at FNA site, no change in nodule since starting antibiotics.   Will discontinue doxycycline. Will schedule for office excision.

## 2016-10-16 ENCOUNTER — Inpatient Hospital Stay: Payer: Self-pay

## 2016-10-16 ENCOUNTER — Ambulatory Visit (INDEPENDENT_AMBULATORY_CARE_PROVIDER_SITE_OTHER): Payer: Medicare Other | Admitting: General Surgery

## 2016-10-16 ENCOUNTER — Encounter: Payer: Self-pay | Admitting: General Surgery

## 2016-10-16 VITALS — BP 132/76 | HR 82 | Resp 12 | Ht 63.0 in | Wt 132.0 lb

## 2016-10-16 DIAGNOSIS — C50811 Malignant neoplasm of overlapping sites of right female breast: Secondary | ICD-10-CM | POA: Diagnosis not present

## 2016-10-16 DIAGNOSIS — C50919 Malignant neoplasm of unspecified site of unspecified female breast: Secondary | ICD-10-CM

## 2016-10-16 DIAGNOSIS — N63 Unspecified lump in unspecified breast: Secondary | ICD-10-CM

## 2016-10-16 DIAGNOSIS — N6341 Unspecified lump in right breast, subareolar: Secondary | ICD-10-CM

## 2016-10-16 HISTORY — DX: Malignant neoplasm of unspecified site of unspecified female breast: C50.919

## 2016-10-16 NOTE — Patient Instructions (Signed)
Breast Biopsy, Care After Refer to this sheet in the next few weeks. These instructions provide you with information about caring for yourself after your procedure. Your health care provider may also give you more specific instructions. Your treatment has been planned according to current medical practices, but problems sometimes occur. Call your health care provider if you have any problems or questions after your procedure. What can I expect after the procedure? After your procedure, it is common to have:  Bruising on your breast.  Numbness, tingling, or pain near your biopsy site. Follow these instructions at home: Medicines   Take over-the-counter and prescription medicines only as told by your health care provider.  Do not drive for 24 hours if you received a sedative.  Do not drink alcohol while taking pain medicine.  Do not drive or operate heavy machinery while taking prescription pain medicine. Biopsy Site Care    Follow instructions from your health care provider about how to take care of your incision or puncture site. Make sure you:  Wash your hands with soap and water before you change your dressing. If soap and water are not available, use hand sanitizer.  Change any bandages (dressings) as told by your health care provider.  Leave any stitches (sutures), skin glue, or adhesive strips in place. These skin closures may need to stay in place for 2 weeks or longer. If adhesive strip edges start to loosen and curl up, you may trim the loose edges. Do not remove adhesive strips completely unless your health care provider tells you to do that.  If you have sutures, keep them dry when bathing.  Check your incision or puncture area every day for signs of infection. Check for:  More redness, swelling, or pain.  More fluid or blood.  Warmth.  Pus or a bad smell.  Protect the biopsy area. Do not let the area get bumped. Activity   If you had an incision during your  procedure,avoid activities that may pull the incision site open. Avoid stretching, reaching, exercise, sports, or lifting anything that is heavier than 3 lb (1.4 kg).  Return to your normal activities as told by your health care provider. Ask your health care provider what activities are safe for you. General instructions   Resume your usual diet.  Wear a good support bra for as long as told by your health care provider.  Get checked for extra fluid around your lymph nodes (lymphedema) as often as told by your health care provider.  Keep all follow-up visits as told by your health care provider. This is important. Contact a health care provider if:  You have more redness, swelling, or pain at the biopsy site.  You have more fluid or blood coming from your biopsy site.  Your biopsy site feels warm to the touch.  You have pus or a bad smell coming from the biopsy site.  Your biopsy site breaks open after the sutures, staples, or skin adhesive strips have been removed.  You have a rash.  You have a fever. Get help right away if:  You have increased bleeding (more than a small spot) from the biopsy site.  You have difficulty breathing.  You have red streaks around the biopsy site. This information is not intended to replace advice given to you by your health care provider. Make sure you discuss any questions you have with your health care provider. Document Released: 12/02/2004 Document Revised: 01/20/2016 Document Reviewed: 02/16/2015 Elsevier Interactive Patient Education  2017 [place].

## 2016-10-16 NOTE — Progress Notes (Signed)
Patient ID: Sandra [name], female   DOB: 04-23-1946, 71 y.o.   MRN: [id number]  Chief Complaint  Patient presents with  . Procedure    right breast excision    HPI Sandra [name] is a 71 y.o. female is here today for her right breast excision.The patient had been notified that her FNA sample had suggested atypical cells and excision was warranted. She had been asked to discontinue her antibiotic therapy at that time. (10/07/2016).  She has had diarrhea for the past 3 days, denies vomiting. Feeling better today. HPI  Past Medical History:  Diagnosis Date  . Anemia   . Anxiety   . Arthritis   . Celiac disease   . Depression   . GERD (gastroesophageal reflux disease)   . Hypothyroidism   . Pneumonia   . Raynaud's disease   . RLS (restless legs syndrome)     Past Surgical History:  Procedure Laterality Date  . ABDOMINAL HYSTERECTOMY    . APPENDECTOMY    . CATARACT EXTRACTION Bilateral   . CESAREAN SECTION    . COLONOSCOPY WITH PROPOFOL N/A 05/19/2015   Procedure: COLONOSCOPY WITH PROPOFOL;  Surgeon: Manya Silvas, MD;  Location: [facility] ENDOSCOPY;  Service: Endoscopy;  Laterality: N/A;  . EYE SURGERY    . HAND SURGERY Left   . TONSILLECTOMY    . UPPER GI ENDOSCOPY  01/12/06   hiatus hernia    Family History  Problem Relation Age of Onset  . Cancer Mother   . Anemia Mother   . Other Mother        lymphosarcoma  . Alcohol abuse Father   . Prostate cancer Father   . Breast cancer Sister        mid 25's  . Breast cancer Maternal Grandmother   . Stroke Maternal Grandmother   . Heart attack Maternal Grandfather   . CAD Paternal Grandfather     Social History Social History  Substance Use Topics  . Smoking status: Never Smoker  . Smokeless tobacco: Never Used  . Alcohol use Yes     Comment: a glass of wine once in a while    Allergies  Allergen Reactions  . Benadryl [Diphenhydramine Hcl (Sleep)]   . Advil [Ibuprofen] Rash    CAN TAKE NAPROSYN  . Claritin  [Loratadine] Rash    Current Outpatient Prescriptions  Medication Sig Dispense Refill  . alendronate (FOSAMAX) 70 MG tablet TAKE 1 TABLET BY MOUTH  EVERY WEEK 12 tablet 3  . ALPRAZolam (XANAX) 0.5 MG tablet Take 1 tablet (0.5 mg total) by mouth at bedtime as needed for anxiety. 90 tablet 1  . Calcium Carbonate (CALCIUM 600 PO) Take 2 tablets by mouth daily.    . Cholecalciferol (VITAMIN D3) 10000 units TABS Take 1 tablet by mouth daily.    [name] conjugated estrogens (PREMARIN) vaginal cream Place 1 Applicatorful vaginally daily. Use 1/2 gram every day x 12 weeks, then decrease to twice weekly 42.5 g 4  . docusate sodium (COLACE) 100 MG capsule Take 100 mg by mouth 2 (two) times daily.    [name] levothyroxine (SYNTHROID, LEVOTHROID) 100 MCG tablet TAKE 1 TABLET BY MOUTH  DAILY 90 tablet 3  . MULTIPLE VITAMIN PO Take by mouth.    . Omega-3 Fatty Acids (FISH OIL PO) Take by mouth.    . Probiotic Product (PROBIOTIC DAILY PO) Take by mouth.    . Psyllium (EQ DAILY FIBER PO) Take 1 tablet by mouth daily.    [name]  senna (SENOKOT) 8.6 MG TABS tablet Take 1 tablet by mouth daily as needed for mild constipation.    . sertraline (ZOLOFT) 100 MG tablet Take 1 tablet (100 mg total) by mouth daily. 90 tablet 3   No current facility-administered medications for this visit.     Review of Systems Review of Systems  Constitutional: Negative.   Respiratory: Negative.   Cardiovascular: Negative.   Gastrointestinal: Positive for diarrhea.    Blood pressure 132/76, pulse 82, resp. rate 12, height 5' 3"  (1.6 m), weight 132 lb (59.9 kg).  Physical Exam Physical Exam  Constitutional: She is oriented to person, place, and time. She appears well-developed and well-nourished.  Pulmonary/Chest: Effort normal and breath sounds normal.    Neurological: She is alert and oriented to person, place, and time.  Skin: Skin is warm and dry.    Data Reviewed Oct 02, 2016 FNA results: DIAGNOSIS: Comment    Comment: RIGHT  BREAST  SUSPICIOUS FOR MALIGNANCY.  HYPOCELLULAR SMEARS SHOWING FEW ATYPICAL DUCTAL CELLS.  CYTOLOGIC FINDINGS SHOULD BE EVALUATED IN CONJUNCTION WITH PHYSICAL AND  MAMMOGRAPHIC FINDINGS.   08/11/2016 bilateral screening mammograms reviewed. BIRAD-1. Retrospective analysis shows no discernible abnormality to correlate with the present clinical finding.   Assessment    Right areolar mass with abnormal cytology.    Plan    The procedure was reviewed and the patient was amenable to proceed. Alcohol prep followed by 10 mL of 0.5% Xylocaine with 0.25% Marcaine with 1-200,000 of epinephrine. This was supplemented with 1.5 mL of 1% plain Xylocaine. ChloraPrep was applied to the skin.  A radial incision from the base of the nipple to the edge of the areola was made. The nodular density was excised, orientated with a short suture on the medial border and a long Prolene suture on the superior border. This was placed promptly in formalin for routine histology. The deep tissue was approximated with interrupted 3-0 Vicryl figure-of-eight sutures. The skin was then closed with a running 3-0 Vicryl septic suture. Benzoin, Steri-Strips followed by Telfa and Tatum dressings applied.  The patient tolerated the procedure well.  She'll make use of Tylenol if needed for pain. She does have access to hydrocodone if needed. The importance of ice and appropriate support was emphasized. Written instructions were provided.   Will call with results.   HPI, Physical Exam, Assessment and Plan have been scribed under the direction and in the presence of Hervey Ard, MD.  Verlene Mayer, CMA  I have completed the exam and reviewed the above documentation for accuracy and completeness.  I agree with the above.  Haematologist has been used and any errors in dictation or transcription are unintentional.  Hervey Ard, M.D., F.A.C.S.          Robert Bellow 10/16/2016, 8:44 PM

## 2016-10-18 ENCOUNTER — Telehealth: Payer: Self-pay | Admitting: General Surgery

## 2016-10-18 NOTE — Telephone Encounter (Signed)
The patient was notified that the recently completed seizure a biopsy from the right breast was positive for malignancy.  She reports that she's had moderate pain and has had good relief with hydrocodone.  Arrangements are in place to get together tomorrow afternoon with family to review options for management.

## 2016-10-19 ENCOUNTER — Encounter: Payer: Self-pay | Admitting: General Surgery

## 2016-10-19 ENCOUNTER — Ambulatory Visit (INDEPENDENT_AMBULATORY_CARE_PROVIDER_SITE_OTHER): Payer: Medicare Other | Admitting: General Surgery

## 2016-10-19 VITALS — BP 156/82 | HR 74 | Resp 14 | Ht 63.0 in | Wt 132.0 lb

## 2016-10-19 DIAGNOSIS — C50911 Malignant neoplasm of unspecified site of right female breast: Secondary | ICD-10-CM | POA: Diagnosis not present

## 2016-10-19 DIAGNOSIS — D0511 Intraductal carcinoma in situ of right breast: Secondary | ICD-10-CM | POA: Diagnosis not present

## 2016-10-19 NOTE — Progress Notes (Signed)
Patient ID: Sandra [name], female   DOB: [birth year], 71 y.o.   MRN: [id number]  Chief Complaint  Patient presents with  . Follow-up    HPI Sandra [name] is a 71 y.o. female here today to discuss biopsy results.  She is here with her husband, Westley Gambles, and Hervey Ard her daughter. The patient had been notified by phone yesterday of the biopsy results.  HPI  Past Medical History:  Diagnosis Date  . Anemia   . Anxiety   . Arthritis   . Celiac disease   . Depression   . GERD (gastroesophageal reflux disease)   . Hypothyroidism   . Pneumonia   . Raynaud's disease   . RLS (restless legs syndrome)     Past Surgical History:  Procedure Laterality Date  . ABDOMINAL HYSTERECTOMY    . APPENDECTOMY    . CATARACT EXTRACTION Bilateral   . CESAREAN SECTION    . COLONOSCOPY WITH PROPOFOL N/A 05/19/2015   Procedure: COLONOSCOPY WITH PROPOFOL;  Surgeon: Manya Silvas, MD;  Location: [facility] ENDOSCOPY;  Service: Endoscopy;  Laterality: N/A;  . EYE SURGERY    . HAND SURGERY Left   . TONSILLECTOMY    . UPPER GI ENDOSCOPY  01/12/06   hiatus hernia    Family History  Problem Relation Age of Onset  . Cancer Mother   . Anemia Mother   . Other Mother        lymphosarcoma  . Alcohol abuse Father   . Prostate cancer Father   . Breast cancer Sister        mid 54's; 1/2 sister, shared mother.  . Breast cancer Maternal Grandmother   . Stroke Maternal Grandmother   . Heart attack Maternal Grandfather   . CAD Paternal Grandfather     Social History Social History  Substance Use Topics  . Smoking status: Never Smoker  . Smokeless tobacco: Never Used  . Alcohol use Yes     Comment: a glass of wine once in a while    Allergies  Allergen Reactions  . Benadryl [Diphenhydramine Hcl (Sleep)]   . Advil [Ibuprofen] Rash    CAN TAKE NAPROSYN  . Claritin [Loratadine] Rash    Current Outpatient Prescriptions  Medication Sig Dispense Refill  . alendronate (FOSAMAX) 70 MG  tablet TAKE 1 TABLET BY MOUTH  EVERY WEEK 12 tablet 3  . ALPRAZolam (XANAX) 0.5 MG tablet Take 1 tablet (0.5 mg total) by mouth at bedtime as needed for anxiety. 90 tablet 1  . Calcium Carbonate (CALCIUM 600 PO) Take 1 tablet by mouth daily.     . Cholecalciferol (VITAMIN D3) 10000 units TABS Take 1 tablet by mouth daily.    [name] conjugated estrogens (PREMARIN) vaginal cream Place 1 Applicatorful vaginally daily. Use 1/2 gram every day x 12 weeks, then decrease to twice weekly 42.5 g 4  . docusate sodium (COLACE) 100 MG capsule Take 100 mg by mouth daily.     [name] levothyroxine (SYNTHROID, LEVOTHROID) 100 MCG tablet TAKE 1 TABLET BY MOUTH  DAILY 90 tablet 3  . MULTIPLE VITAMIN PO Take by mouth.    . Omega-3 Fatty Acids (FISH OIL PO) Take by mouth.    . Probiotic Product (PROBIOTIC DAILY PO) Take by mouth.    . Psyllium (EQ DAILY FIBER PO) Take 1 tablet by mouth daily.    [name] senna (SENOKOT) 8.6 MG TABS tablet Take 1 tablet by mouth daily as needed for mild constipation.    . sertraline (  ZOLOFT) 100 MG tablet Take 1 tablet (100 mg total) by mouth daily. (Patient taking differently: Take 100 mg by mouth at bedtime. ) 90 tablet 3   No current facility-administered medications for this visit.     Review of Systems Review of Systems  Constitutional: Negative.   Respiratory: Negative.   Cardiovascular: Negative.     Blood pressure (!) 156/82, pulse 74, resp. rate 14, height 5' 3" (1.6 m), weight 132 lb (59.9 kg).  Physical Exam Physical Exam  Constitutional: She is oriented to person, place, and time. She appears well-developed and well-nourished.  Pulmonary/Chest:  Right breast incision clean, steri strips intact  Neurological: She is alert and oriented to person, place, and time.  Skin: Skin is warm and dry.  Psychiatric: Her behavior is normal.    Data Reviewed Review of the March 2018 mammograms completed at the time of her initial visit on 09/28/2016 did not show evidence of the palpable  nodule within the areola. GYN evaluation of 10/03/2016 reviewed. Initiation of vaginal estrogen for treatment of atrophy.  Pathology from 10/16/2016 excisional biopsy: _0 IMMUNOHISTOCHEMICAL AND MORPHOMETRIC ANALYSIS PERFORMED MANUALLY Estrogen Receptor: 100%, POSITIVE, STRONG STAINING INTENSITY Progesterone Receptor: 100%, POSITIVE, STRONG STAINING INTENSITY Proliferation Marker Ki67: 3% _1 Diagnosis Breast, biopsy, right, lesion at 9:00 o'clock - INVASIVE DUCTAL CARCINOMA, GRADE I/III, SPANNING AT LEAST 1.0 CM. - DUCTAL CARCINOMA IN SITU, INTERMEDIATE GRADE. 1 of 3 FINAL for Palardy, Anagha L ([id number]) Diagnosis(continued) - INVASIVE CARCINOMA IS BROADLY PRESENT AT THE ANTERIOR MARGIN AND FOCALLY LESS THAN 0.1 CM TO THE INFERIOR/LATERAL JUNCTIONAL MARGIN. - DUCTAL CARCINOMA IN SITU IS FOCALLY PRESENT AT THE POSTERIOR MARGIN. - SEE ONCOLOGY TABLE BELOW. Microscopic Comment BREAST, INVASIVE TUMOR Procedure: Lumpectomy. Laterality: Right Tumor Size: At least 1.0 cm (glass slide measurement) Histologic Type: Ductal Grade: I Tubular Differentiation: 2 Nuclear Pleomorphism: 2 Mitotic Count: 1 Ductal Carcinoma in Situ (DCIS): Present, intermediate grade Extent of Tumor: Confined to breast parenchyma Margins: Invasive carcinoma, distance from closest margin: Broadly present at the anterior margin focally less than 0.1 cm to the inferior/lateral junctional margin DCIS, distance from closest margin: Focally present at the posterior margin IMMUNOHISTOCHEMICAL AND MORPHOMETRIC ANALYSIS PERFORMED MANUALLY Estrogen Receptor: 100%, POSITIVE, STRONG STAINING INTENSITY Progesterone Receptor: 100%, POSITIVE, STRONG STAINING INTENSITY Proliferation Marker Ki67: 3%  Assessment    Stage I carcinoma of the right breast in the retroareolar tissue. Broadly positive anterior margin which was just below the areolar skin.    Plan    The prognostic indicators show a very low Ki-67 level which  would speak against aggressive tumor, although it did seem to come off fairly suddenly and is not visible on mammograms from only 2 months ago.  Options for management were reviewed in detail. With the retroareolar location I think it would be an advisable to attempt nipple sparing if rest conservation is chosen. Breast conservation can be considered without sparing the nipple areolar complex which would still allow reasonably good preservation of body contour while dressed, but would likely result in a 1/2 cup size decrease in volume which could easily be addressed with a padded bra. Indications for post partial mastectomy radiation were reviewed.  Options for simple mastectomy with or without immediate reconstruction were discussed. These were both presented as equivalent procedures with rest conservation.  The patient had recently started Premarin cream for vaginal dryness. This can be continued while were waiting surgical intervention.  HER-2/neu status is pending, but does not determine options for primary management of the tumor.  The patient's family history while notable in second-degree relatives is notable only for one half-sister (shared mother) with breast cancer. This individual developed cancer in her 52s, and her mother had no history of breast cancer. Will determine whether she would be a candidate for BRCA testing. This would not mandate mastectomy even if positive.    Availability of surgical second opinion was reviewed.  The patient's daughter is a recovery room nurse in [place], [place].  Informational brochure and website information including that of the American Society of Breast Sur[employer] was provided.  Robert Bellow 10/20/2016, 7:31 AM   I have completed the exam and reviewed the above documentation for accuracy and completeness.  I agree with the above.  Haematologist has been used and any errors in dictation or transcription are unintentional.  Hervey Ard, M.D., F.A.C.S.   Patient to have the following labs drawn at [facility] today: CA 27-29.  Dominga Ferry, CMA

## 2016-10-20 ENCOUNTER — Other Ambulatory Visit: Payer: Self-pay

## 2016-10-20 ENCOUNTER — Telehealth: Payer: Self-pay | Admitting: General Surgery

## 2016-10-20 ENCOUNTER — Telehealth: Payer: Self-pay

## 2016-10-20 ENCOUNTER — Encounter: Payer: Self-pay | Admitting: General Surgery

## 2016-10-20 DIAGNOSIS — C50911 Malignant neoplasm of unspecified site of right female breast: Secondary | ICD-10-CM | POA: Insufficient documentation

## 2016-10-20 LAB — CANCER ANTIGEN 27.29: CA 27.29: 28.3 U/mL (ref 0.0–38.6)

## 2016-10-20 NOTE — Telephone Encounter (Signed)
The patient was seen yesterday afternoon in regards to her recently diagnosed right breast cancer. She called the office today reporting that she had decided to proceed with right simple mastectomy.  I spoke with her by phone to confirm her decision and plans to complete this on 11/07/2016.  She is aware that she should call if she develops any new questions or concerns.

## 2016-10-20 NOTE — Telephone Encounter (Signed)
Patient called and would like to set up surgery. She has decided to have a simple mastectomy without reconstruction done on 11/07/16. The patient will be contacted once orders are in to review instructions.

## 2016-10-24 ENCOUNTER — Encounter: Payer: Self-pay | Admitting: *Deleted

## 2016-10-24 ENCOUNTER — Telehealth: Payer: Self-pay | Admitting: *Deleted

## 2016-10-24 NOTE — Telephone Encounter (Signed)
-----  Message from Robert Bellow, MD sent at 10/19/2016  4:24 PM EDT ----- See if patient qualifies for BRCA testing: PAunt (> 60), maternal 1/2 sister > 73 and MGM (? Age).  Thanks.

## 2016-10-24 NOTE — Telephone Encounter (Signed)
Appointment made for 10-25-16, instructions given

## 2016-10-24 NOTE — Telephone Encounter (Signed)
She does qualify for genetic testing if she wishes to proceed?

## 2016-10-25 ENCOUNTER — Telehealth: Payer: Self-pay

## 2016-10-25 ENCOUNTER — Ambulatory Visit (INDEPENDENT_AMBULATORY_CARE_PROVIDER_SITE_OTHER): Payer: Medicare Other | Admitting: *Deleted

## 2016-10-25 DIAGNOSIS — C50911 Malignant neoplasm of unspecified site of right female breast: Secondary | ICD-10-CM | POA: Diagnosis not present

## 2016-10-25 NOTE — Patient Instructions (Signed)
The patient is aware to call back for any questions or concerns.  

## 2016-10-25 NOTE — Telephone Encounter (Signed)
Spoke with patient about her surgery. The patient is scheduled for surgery at [facility] on 11/07/16. She will pre admit by phone. The patient is aware of date and instructions. Surgery instructions reviewed and given to the patient.

## 2016-10-25 NOTE — Progress Notes (Signed)
Patient ID: Sandra [name], female   DOB: [birth year], 71 y.o.   MRN: [id number]   Here today for genetic testing, completed.

## 2016-10-27 DIAGNOSIS — Z1371 Encounter for nonprocreative screening for genetic disease carrier status: Secondary | ICD-10-CM

## 2016-10-27 HISTORY — DX: Encounter for nonprocreative screening for genetic disease carrier status: Z13.71

## 2016-10-31 ENCOUNTER — Encounter
Admission: RE | Admit: 2016-10-31 | Discharge: 2016-10-31 | Disposition: A | Discharge status: Home / Self Care | Payer: Medicare Other | Source: Ambulatory Visit | Attending: General Surgery | Admitting: General Surgery

## 2016-10-31 ENCOUNTER — Ambulatory Visit: Payer: Self-pay | Admitting: General Surgery

## 2016-10-31 DIAGNOSIS — Z136 Encounter for screening for cardiovascular disorders: Secondary | ICD-10-CM | POA: Diagnosis not present

## 2016-10-31 DIAGNOSIS — Z01810 Encounter for preprocedural cardiovascular examination: Secondary | ICD-10-CM | POA: Insufficient documentation

## 2016-10-31 DIAGNOSIS — R9431 Abnormal electrocardiogram [ECG] [EKG]: Secondary | ICD-10-CM | POA: Insufficient documentation

## 2016-10-31 HISTORY — DX: Headache: R51

## 2016-10-31 HISTORY — DX: Cardiac murmur, unspecified: R01.1

## 2016-10-31 HISTORY — DX: Headache, unspecified: R51.9

## 2016-10-31 NOTE — Patient Instructions (Signed)
  Your procedure is scheduled on: 11-07-16 (TUESDAY) Report to the [place] (2ND DESK ON RIGHT ) @ 9:15 AM  Remember: Instructions that are not followed completely may result in serious medical risk, up to and including death, or upon the discretion of your surgeon and anesthesiologist your surgery may need to be rescheduled.    _x___ 1. Do not eat food or drink liquids after midnight. No gum chewing or hard candies.     __x__ 2. No Alcohol for 24 hours before or after surgery.   __x__3. No Smoking for 24 prior to surgery.   ____  4. Bring all medications with you on the day of surgery if instructed.    __x__ 5. Notify your doctor if there is any change in your medical condition     (cold, fever, infections).     Do not wear jewelry, make-up, hairpins, clips or nail polish.  Do not wear lotions, powders, or perfumes. You may wear deodorant.  Do not shave 48 hours prior to surgery. Men may shave face and neck.  Do not bring valuables to the hospital.    [facility] is not responsible for any belongings or valuables.               Contacts, dentures or bridgework may not be worn into surgery.  Leave your suitcase in the car. After surgery it may be brought to your room.  For patients admitted to the hospital, discharge time is determined by your treatment team.   Patients discharged the day of surgery will not be allowed to drive home.  You will need someone to drive you home and stay with you the night of your procedure.    Please read over the following fact sheets that you were given:     _x___ [place] WITH A SMALL SIP OF WATER. These include:  1. LEVOTHYROXINE  2.  3.  4.  5.  6.  ____Fleets enema or Magnesium Citrate as directed.   _x___ Use CHG Soap or sage wipes as directed on instruction sheet   ____ Use inhalers on the day of surgery and bring to hospital day of surgery  ____ Stop Metformin and  Janumet 2 days prior to surgery.    ____ Take 1/2 of usual insulin dose the night before surgery and none on the morning surgery.   ____ Follow recommendations from Cardiologist, Pulmonologist or PCP regarding stopping Aspirin, Coumadin, Pllavix ,Eliquis, Effient, or Pradaxa, and Pletal.  ____Stop Anti-inflammatories such as Advil, Aleve, Ibuprofen, Motrin, Naproxen, Naprosyn, Goodies powders or aspirin products. OK to take Tylenol    _x___ Stop supplements until after surgery-STOP FISH OIL AND VITAMIN E NOW-MAY RESUME AFTER SURGERY   ____ Bring C-Pap to the hospital.

## 2016-11-01 ENCOUNTER — Encounter
Admission: RE | Admit: 2016-11-01 | Discharge: 2016-11-01 | Disposition: A | Discharge status: Home / Self Care | Payer: Medicare Other | Source: Ambulatory Visit | Attending: General Surgery | Admitting: General Surgery

## 2016-11-01 ENCOUNTER — Telehealth: Payer: Self-pay | Admitting: *Deleted

## 2016-11-01 ENCOUNTER — Other Ambulatory Visit: Payer: Self-pay | Admitting: *Deleted

## 2016-11-01 DIAGNOSIS — Z01810 Encounter for preprocedural cardiovascular examination: Secondary | ICD-10-CM | POA: Diagnosis not present

## 2016-11-01 DIAGNOSIS — R9431 Abnormal electrocardiogram [ECG] [EKG]: Secondary | ICD-10-CM | POA: Diagnosis not present

## 2016-11-01 NOTE — Telephone Encounter (Signed)
Unable to reach pt. Left a message on vm for her to return call.

## 2016-11-01 NOTE — Progress Notes (Unsigned)
Please schedule pt for cardiology for abnormal ekg. Thanks!

## 2016-11-01 NOTE — Pre-Procedure Instructions (Signed)
CALLED DR [id number] REGARDING ABNORMAL EKG-DR [place]

## 2016-11-01 NOTE — Pre-Procedure Instructions (Signed)
SPOKE WITH CARYL-LYN AT DR [place] OFFICE ABOUT PT NEEDING MEDICAL CLEARANCE- FAXED CLEARANCE REQUEST AND EKG TO DR Marlan Palau OFFICE WITH FAX CONFIRMATION [place] FAXED CLEARANCE AND EKG OVER TO DR BYRNETTS OFFICE WITH FAX CONFIRMATION RECIEVED

## 2016-11-02 ENCOUNTER — Telehealth: Payer: Self-pay

## 2016-11-02 ENCOUNTER — Encounter: Payer: Self-pay | Admitting: General Surgery

## 2016-11-02 DIAGNOSIS — R0602 Shortness of breath: Secondary | ICD-10-CM | POA: Diagnosis not present

## 2016-11-02 DIAGNOSIS — R9431 Abnormal electrocardiogram [ECG] [EKG]: Secondary | ICD-10-CM | POA: Diagnosis not present

## 2016-11-02 DIAGNOSIS — I208 Other forms of angina pectoris: Secondary | ICD-10-CM | POA: Diagnosis not present

## 2016-11-02 NOTE — Pre-Procedure Instructions (Signed)
RECEIVED CARDIAC CLEARANCE FROM DR FATH-MILD RISK-PLACED ON CHART

## 2016-11-02 NOTE — Telephone Encounter (Signed)
Received a call from [facility] at pre admit testing at [facility] and the patient will need a Medical clearance for surgery on 11/07/16 due to abnormal EKG. I spoke with Chrys Racer at [facility] and she said that Dr Rosanna Randy was out of the office all this week. I faxed the request for clearance as well as the EKG to her and she will forward this to Dr Caryn Section to look at and determine if she is cleared for surgery.

## 2016-11-02 NOTE — Telephone Encounter (Signed)
The patient is schedule to see Dr Clayborn Bigness today at 2:00 pm for Cardiac clearance.

## 2016-11-02 NOTE — Telephone Encounter (Signed)
Spoke with Chrys Racer at [facility] and Dr Caryn Section is referring the patient to Cardiology for clearance. They are attempting to contact the patient about this. Surgery may need to be postponed depending on when they can get her scheduled.

## 2016-11-03 NOTE — Telephone Encounter (Signed)
Pt was notified she will need referral to cardiology for pre-op.

## 2016-11-07 ENCOUNTER — Ambulatory Visit
Admission: RE | Admit: 2016-11-07 | Discharge: 2016-11-07 | Disposition: A | Discharge status: Home / Self Care | Payer: Medicare Other | Source: Ambulatory Visit | Attending: General Surgery | Admitting: General Surgery

## 2016-11-07 ENCOUNTER — Encounter: Payer: Self-pay | Admitting: *Deleted

## 2016-11-07 ENCOUNTER — Ambulatory Visit: Payer: Medicare Other | Admitting: Anesthesiology

## 2016-11-07 ENCOUNTER — Encounter
Admission: RE | Disposition: A | Discharge status: Home / Self Care | Payer: Self-pay | Source: Ambulatory Visit | Attending: General Surgery

## 2016-11-07 DIAGNOSIS — I739 Peripheral vascular disease, unspecified: Secondary | ICD-10-CM | POA: Diagnosis not present

## 2016-11-07 DIAGNOSIS — Z170 Estrogen receptor positive status [ER+]: Secondary | ICD-10-CM | POA: Insufficient documentation

## 2016-11-07 DIAGNOSIS — Z7983 Long term (current) use of bisphosphonates: Secondary | ICD-10-CM | POA: Insufficient documentation

## 2016-11-07 DIAGNOSIS — E039 Hypothyroidism, unspecified: Secondary | ICD-10-CM | POA: Insufficient documentation

## 2016-11-07 DIAGNOSIS — Z79899 Other long term (current) drug therapy: Secondary | ICD-10-CM | POA: Diagnosis not present

## 2016-11-07 DIAGNOSIS — F419 Anxiety disorder, unspecified: Secondary | ICD-10-CM | POA: Insufficient documentation

## 2016-11-07 DIAGNOSIS — F329 Major depressive disorder, single episode, unspecified: Secondary | ICD-10-CM | POA: Diagnosis not present

## 2016-11-07 DIAGNOSIS — Z803 Family history of malignant neoplasm of breast: Secondary | ICD-10-CM | POA: Diagnosis not present

## 2016-11-07 DIAGNOSIS — C50911 Malignant neoplasm of unspecified site of right female breast: Secondary | ICD-10-CM | POA: Diagnosis not present

## 2016-11-07 DIAGNOSIS — Z79890 Hormone replacement therapy (postmenopausal): Secondary | ICD-10-CM | POA: Insufficient documentation

## 2016-11-07 DIAGNOSIS — C50111 Malignant neoplasm of central portion of right female breast: Secondary | ICD-10-CM | POA: Diagnosis not present

## 2016-11-07 HISTORY — PX: SIMPLE MASTECTOMY WITH AXILLARY SENTINEL NODE BIOPSY: SHX6098

## 2016-11-07 HISTORY — PX: SENTINEL NODE BIOPSY: SHX6608

## 2016-11-07 HISTORY — PX: MASTECTOMY: SHX3

## 2016-11-07 SURGERY — SIMPLE MASTECTOMY
Anesthesia: General | Laterality: Right | Wound class: Clean Contaminated

## 2016-11-07 MED ORDER — METHYLENE BLUE 0.5 % INJ SOLN
INTRAVENOUS | Status: AC
  Filled 2016-11-07: qty 10

## 2016-11-07 MED ORDER — LACTATED RINGERS IV SOLN
INTRAVENOUS | Status: DC
  Administered 2016-11-07: 10:00:00 via INTRAVENOUS

## 2016-11-07 MED ORDER — GLYCOPYRROLATE 0.2 MG/ML IJ SOLN
INTRAMUSCULAR | Status: DC | PRN
  Administered 2016-11-07: 0.2 mg via INTRAVENOUS

## 2016-11-07 MED ORDER — LIDOCAINE HCL (CARDIAC) 20 MG/ML IV SOLN
INTRAVENOUS | Status: DC | PRN
  Administered 2016-11-07: 60 mg via INTRAVENOUS

## 2016-11-07 MED ORDER — LIDOCAINE HCL (PF) 2 % IJ SOLN
INTRAMUSCULAR | Status: AC
  Filled 2016-11-07: qty 2

## 2016-11-07 MED ORDER — PHENYLEPHRINE HCL 10 MG/ML IJ SOLN
INTRAMUSCULAR | Status: DC | PRN
Start: 2016-11-07 — End: 2016-11-07
  Administered 2016-11-07: 100 ug via INTRAVENOUS

## 2016-11-07 MED ORDER — FENTANYL CITRATE (PF) 100 MCG/2ML IJ SOLN
INTRAMUSCULAR | Status: AC
  Administered 2016-11-07: 25 ug via INTRAVENOUS
  Filled 2016-11-07: qty 2

## 2016-11-07 MED ORDER — PROPOFOL 10 MG/ML IV BOLUS
INTRAVENOUS | Status: AC
  Filled 2016-11-07: qty 20

## 2016-11-07 MED ORDER — ONDANSETRON HCL 4 MG/2ML IJ SOLN
INTRAMUSCULAR | Status: AC
  Filled 2016-11-07: qty 2

## 2016-11-07 MED ORDER — HYDROCODONE-ACETAMINOPHEN 5-325 MG PO TABS
ORAL_TABLET | ORAL | Status: AC
  Filled 2016-11-07: qty 1

## 2016-11-07 MED ORDER — PROPOFOL 10 MG/ML IV BOLUS
INTRAVENOUS | Status: DC | PRN
  Administered 2016-11-07: 150 mg via INTRAVENOUS

## 2016-11-07 MED ORDER — TECHNETIUM TC 99M SULFUR COLLOID FILTERED
0.8110 | Freq: Once | INTRAVENOUS | Status: AC | PRN
  Administered 2016-11-07: 0.811 via INTRADERMAL

## 2016-11-07 MED ORDER — ACETAMINOPHEN 10 MG/ML IV SOLN
INTRAVENOUS | Status: AC
  Filled 2016-11-07: qty 100

## 2016-11-07 MED ORDER — FAMOTIDINE 20 MG PO TABS
20.0000 mg | ORAL_TABLET | Freq: Once | ORAL | Status: AC
  Administered 2016-11-07: 20 mg via ORAL

## 2016-11-07 MED ORDER — DEXAMETHASONE SODIUM PHOSPHATE 10 MG/ML IJ SOLN
INTRAMUSCULAR | Status: DC | PRN
  Administered 2016-11-07: 10 mg via INTRAVENOUS

## 2016-11-07 MED ORDER — MIDAZOLAM HCL 2 MG/2ML IJ SOLN
INTRAMUSCULAR | Status: AC
  Filled 2016-11-07: qty 2

## 2016-11-07 MED ORDER — HYDROCODONE-ACETAMINOPHEN 5-325 MG PO TABS: 1.0000 | ORAL_TABLET | ORAL | 0 refills | Status: DC | PRN

## 2016-11-07 MED ORDER — HYDROCODONE-ACETAMINOPHEN 5-325 MG PO TABS
1.0000 | ORAL_TABLET | ORAL | Status: DC | PRN
  Administered 2016-11-07: 1 via ORAL

## 2016-11-07 MED ORDER — DEXAMETHASONE SODIUM PHOSPHATE 10 MG/ML IJ SOLN
INTRAMUSCULAR | Status: AC
  Filled 2016-11-07: qty 1

## 2016-11-07 MED ORDER — KETOROLAC TROMETHAMINE 30 MG/ML IJ SOLN
INTRAMUSCULAR | Status: DC | PRN
  Administered 2016-11-07: 30 mg via INTRAVENOUS

## 2016-11-07 MED ORDER — FENTANYL CITRATE (PF) 100 MCG/2ML IJ SOLN
INTRAMUSCULAR | Status: AC
  Filled 2016-11-07: qty 2

## 2016-11-07 MED ORDER — MIDAZOLAM HCL 2 MG/2ML IJ SOLN
INTRAMUSCULAR | Status: DC | PRN
  Administered 2016-11-07: 2 mg via INTRAVENOUS

## 2016-11-07 MED ORDER — FENTANYL CITRATE (PF) 100 MCG/2ML IJ SOLN
INTRAMUSCULAR | Status: DC | PRN
  Administered 2016-11-07: 50 ug via INTRAVENOUS

## 2016-11-07 MED ORDER — FENTANYL CITRATE (PF) 100 MCG/2ML IJ SOLN
25.0000 ug | INTRAMUSCULAR | Status: DC | PRN
  Administered 2016-11-07 (×4): 25 ug via INTRAVENOUS

## 2016-11-07 MED ORDER — FAMOTIDINE 20 MG PO TABS
ORAL_TABLET | ORAL | Status: AC
  Filled 2016-11-07: qty 1

## 2016-11-07 MED ORDER — GLYCOPYRROLATE 0.2 MG/ML IJ SOLN
INTRAMUSCULAR | Status: AC
  Filled 2016-11-07: qty 1

## 2016-11-07 MED ORDER — ACETAMINOPHEN 10 MG/ML IV SOLN
INTRAVENOUS | Status: DC | PRN
  Administered 2016-11-07: 1000 mg via INTRAVENOUS

## 2016-11-07 MED ORDER — ONDANSETRON HCL 4 MG/2ML IJ SOLN: 4.0000 mg | Freq: Once | INTRAMUSCULAR | Status: DC | PRN

## 2016-11-07 MED ORDER — ONDANSETRON HCL 4 MG/2ML IJ SOLN
INTRAMUSCULAR | Status: DC | PRN
  Administered 2016-11-07: 4 mg via INTRAVENOUS

## 2016-11-07 MED ORDER — KETOROLAC TROMETHAMINE 30 MG/ML IJ SOLN
INTRAMUSCULAR | Status: AC
  Filled 2016-11-07: qty 1

## 2016-11-07 SURGICAL SUPPLY — 57 items
APPLIER CLIP 11 MED OPEN (CLIP)
APPLIER CLIP 13 LRG OPEN (CLIP)
BANDAGE ELASTIC 6 LF NS (GAUZE/BANDAGES/DRESSINGS) ×2 IMPLANT
BLADE PHOTON ILLUMINATED (MISCELLANEOUS) ×2 IMPLANT
BLADE SURG 15 STRL SS SAFETY (BLADE) ×2 IMPLANT
BNDG GAUZE 4.5X4.1 6PLY STRL (MISCELLANEOUS) ×2 IMPLANT
BULB RESERV EVAC DRAIN JP 100C (MISCELLANEOUS) ×2 IMPLANT
CANISTER SUCT 1200ML W/VALVE (MISCELLANEOUS) ×2 IMPLANT
CHLORAPREP W/TINT 26ML (MISCELLANEOUS) ×2 IMPLANT
CLIP APPLIE 11 MED OPEN (CLIP) IMPLANT
CLIP APPLIE 13 LRG OPEN (CLIP) IMPLANT
CNTNR SPEC 2.5X3XGRAD LEK (MISCELLANEOUS) ×3
CONT SPEC 4OZ STER OR WHT (MISCELLANEOUS) ×3
CONTAINER SPEC 2.5X3XGRAD LEK (MISCELLANEOUS) ×3 IMPLANT
COVER PROBE FLX POLY STRL (MISCELLANEOUS) ×2 IMPLANT
DEVICE DUBIN SPECIMEN MAMMOGRA (MISCELLANEOUS) IMPLANT
DRAIN CHANNEL JP 15F RND 16 (MISCELLANEOUS) ×2 IMPLANT
DRAPE LAPAROTOMY TRNSV 106X77 (MISCELLANEOUS) ×2 IMPLANT
DRSG TELFA 3X8 NADH (GAUZE/BANDAGES/DRESSINGS) ×2 IMPLANT
ELECT CAUTERY BLADE TIP 2.5 (TIP) ×2
ELECT REM PT RETURN 9FT ADLT (ELECTROSURGICAL) ×2
ELECTRODE CAUTERY BLDE TIP 2.5 (TIP) ×1 IMPLANT
ELECTRODE REM PT RTRN 9FT ADLT (ELECTROSURGICAL) ×1 IMPLANT
GAUZE FLUFF 18X24 1PLY STRL (GAUZE/BANDAGES/DRESSINGS) ×2 IMPLANT
GAUZE SPONGE 4X4 12PLY STRL (GAUZE/BANDAGES/DRESSINGS) IMPLANT
GLOVE BIO SURGEON STRL SZ7.5 (GLOVE) ×6 IMPLANT
GLOVE INDICATOR 8.0 STRL GRN (GLOVE) ×4 IMPLANT
GOWN STRL REUS W/ TWL LRG LVL3 (GOWN DISPOSABLE) ×2 IMPLANT
GOWN STRL REUS W/TWL LRG LVL3 (GOWN DISPOSABLE) ×2
KIT RM TURNOVER STRD PROC AR (KITS) ×2 IMPLANT
LABEL OR SOLS (LABEL) ×2 IMPLANT
NDL SAFETY 18GX1.5 (NEEDLE) IMPLANT
NDL SAFETY 22GX1.5 (NEEDLE) IMPLANT
NEEDLE FILTER BLUNT 18X 1/2SAF (NEEDLE) ×1
NEEDLE FILTER BLUNT 18X1 1/2 (NEEDLE) ×1 IMPLANT
NEEDLE HYPO 25GX1X1/2 BEV (NEEDLE) ×2 IMPLANT
PACK BASIN MINOR ARMC (MISCELLANEOUS) ×2 IMPLANT
PIN SAFETY STRL (MISCELLANEOUS) ×2 IMPLANT
SHEARS FOC LG CVD HARMONIC 17C (MISCELLANEOUS) IMPLANT
SLEVE PROBE SENORX GAMMA FIND (MISCELLANEOUS) ×2 IMPLANT
SPONGE LAP 18X18 5 PK (GAUZE/BANDAGES/DRESSINGS) ×2 IMPLANT
STRIP CLOSURE SKIN 1/2X4 (GAUZE/BANDAGES/DRESSINGS) ×4 IMPLANT
SUT ETHILON 3-0 FS-10 30 BLK (SUTURE) ×2
SUT SILK 0 (SUTURE) ×1
SUT SILK 0 30XBRD TIE 6 (SUTURE) ×1 IMPLANT
SUT SILK 3 0 (SUTURE)
SUT SILK 3-0 18XBRD TIE 12 (SUTURE) IMPLANT
SUT VIC AB 2-0 CT1 27 (SUTURE) ×2
SUT VIC AB 2-0 CT1 TAPERPNT 27 (SUTURE) ×2 IMPLANT
SUT VIC AB 2-0 CT2 27 (SUTURE) IMPLANT
SUT VIC AB 3-0 SH 27 (SUTURE)
SUT VIC AB 3-0 SH 27X BRD (SUTURE) IMPLANT
SUT VICRYL+ 3-0 144IN (SUTURE) ×2 IMPLANT
SUTURE EHLN 3-0 FS-10 30 BLK (SUTURE) ×1 IMPLANT
SWABSTK COMLB BENZOIN TINCTURE (MISCELLANEOUS) ×2 IMPLANT
SYRINGE 10CC LL (SYRINGE) ×2 IMPLANT
TAPE TRANSPORE STRL 2 31045 (GAUZE/BANDAGES/DRESSINGS) ×2 IMPLANT

## 2016-11-07 NOTE — Anesthesia Postprocedure Evaluation (Signed)
Anesthesia Post Note  Patient: Sandra [name]  Procedure(s) Performed: Procedure(s) (LRB): SIMPLE MASTECTOMY (Right) SENTINEL NODE BIOPSY (Right)  Patient location during evaluation: PACU Anesthesia Type: General Level of consciousness: awake and alert and oriented Pain management: pain level controlled Vital Signs Assessment: post-procedure vital signs reviewed and stable Respiratory status: spontaneous breathing Cardiovascular status: blood pressure returned to baseline Anesthetic complications: no     Last Vitals:  Vitals:   11/07/16 1211 11/07/16 1220  BP: (!) 144/75   Pulse: 67 68  Resp: 15 14  Temp:      Last Pain:  Vitals:   11/07/16 1220  TempSrc:   PainSc: 7                  Mikhala Kenan

## 2016-11-07 NOTE — Anesthesia Preprocedure Evaluation (Signed)
Anesthesia Evaluation  Patient identified by MRN, date of birth, ID band Patient awake    Reviewed: Allergy & Precautions, H&P , NPO status , Patient's Chart, lab work & pertinent test results, reviewed documented beta blocker date and time   Airway Mallampati: II  TM Distance: >3 FB Neck ROM: full    Dental no notable dental hx.    Pulmonary neg pulmonary ROS, pneumonia,    Pulmonary exam normal breath sounds clear to auscultation       Cardiovascular Exercise Tolerance: Good + Peripheral Vascular Disease  negative cardio ROS  + Valvular Problems/Murmurs  Rhythm:regular Rate:Normal     Neuro/Psych  Headaches, PSYCHIATRIC DISORDERS Anxiety Depression negative neurological ROS  negative psych ROS   GI/Hepatic negative GI ROS, Neg liver ROS, GERD  Controlled,  Endo/Other  negative endocrine ROSHypothyroidism   Renal/GU negative Renal ROS  negative genitourinary   Musculoskeletal  (+) Arthritis , Osteoarthritis,    Abdominal   Peds negative pediatric ROS (+)  Hematology negative hematology ROS (+) anemia ,   Anesthesia Other Findings Past Medical History: No date: Anemia No date: Anxiety No date: Arthritis 10/16/2016: Breast cancer ([place])     Comment: INVASIVE DUCTAL CARCINOMA, GRADE I/III,               SPANNING AT LEAST 1.0 CM. ER/PR pos  HER2 not               over expressed No date: Celiac disease No date: Depression No date: GERD (gastroesophageal reflux disease)     Comment: OCC No date: Headache     Comment: H/O MIGRAINES AS TEENAGER No date: Heart murmur     Comment: PT WAS TOLD ONCE SHE HAD MVP AND THEN ANOTHER               MD SAID SHE DID NOT HAVE MVP No date: Hypothyroidism No date: Pneumonia     Comment: YEARS AGO No date: Raynaud's disease No date: RLS (restless legs syndrome)   Patient cleared by Dr. Clayborn Bigness  Reproductive/Obstetrics negative OB ROS                              Anesthesia Physical  Anesthesia Plan  ASA: III  Anesthesia Plan: General   Post-op Pain Management:    Induction: Intravenous  PONV Risk Score and Plan: 3 and Ondansetron, Dexamethasone, Propofol and Midazolam  Airway Management Planned: LMA  Additional Equipment:   Intra-op Plan:   Post-operative Plan:   Informed Consent: I have reviewed the patients History and Physical, chart, labs and discussed the procedure including the risks, benefits and alternatives for the proposed anesthesia with the patient or authorized representative who has indicated his/her understanding and acceptance.   Dental Advisory Given and Dental advisory given  Plan Discussed with: CRNA and Surgeon  Anesthesia Plan Comments:         Anesthesia Quick Evaluation

## 2016-11-07 NOTE — Anesthesia Post-op Follow-up Note (Cosign Needed)
Anesthesia QCDR form completed.        

## 2016-11-07 NOTE — Op Note (Signed)
Preoperative diagnosis: Invasive right breast cancer  Postoperative diagnosis: Same.  Operative procedure: Right simple mastectomy with sentinel node biopsy.  Operating surgeon: Hervey Ard, M.D.  Anesthesia: Gen. via LMA.  Estimated blood loss: 10 mL.  Clinical note: This 71 year old woman was recently identified with a right breast cancer and has elected mastectomy. She was injected with technetium sulfur: Prior to the procedure.  Operative note: With the patient under adequate general anesthesia the right breast chest and axilla was prepped with ChloraPrep and draped after the instillation of 5 mL of 0.5% methylene blue in the subareolar plexus. An elliptical incision was outlined. The skin was incised sharply and the remaining dissection completed with the photon blade. The margins of resection were just below the clavicle superiorly, sternum medially, rectus fascia inferiorly and serratus fascia. The breast was removed from the chest wall taking the fascia of the pectoralis muscle with the specimen. The axillary envelope was opened. One hot blue lymph node and 3 hot, non-blue lymph nodes were identified making use of the node seeker. These were sent in formalin for routine histology. Excellent hemostasis was noted. A single 15-gauge Blake drain was brought out through an incision in the inferior lateral aspect of the flap. This was anchored in place with 3-0 nylon. The skin flaps were reapproximated making use of a running 2-0 Vicryl deep dermal suture in 2 segments. Benzoin and Steri-Strips followed by Telfa dressing was applied. Fluff gauze followed by Kerlix and an Ace wrap was then placed. The drain was placed to self suction. The patient tolerated the procedure well and was taken recovery room in stable condition.

## 2016-11-07 NOTE — H&P (Signed)
Cleared by cardiology. For right mastectomy, SLN biopsy.

## 2016-11-07 NOTE — Transfer of Care (Signed)
Immediate Anesthesia Transfer of Care Note  Patient: Sandra [name]  Procedure(s) Performed: Procedure(s): SIMPLE MASTECTOMY (Right) SENTINEL NODE BIOPSY (Right)  Patient Location: PACU  Anesthesia Type:General  Level of Consciousness: sedated  Airway & Oxygen Therapy: Patient Spontanous Breathing and Patient connected to face mask oxygen  Post-op Assessment: Report given to RN and Post -op Vital signs reviewed and stable  Post vital signs: Reviewed and stable  Last Vitals:  Vitals:   11/07/16 0939 11/07/16 1156  BP: (!) 144/86 (!) 156/77  Pulse: 66 66  Resp: (!) 6 15  Temp: 36.7 C 74.1 C    Complications: No apparent anesthesia complications

## 2016-11-07 NOTE — Discharge Instructions (Signed)

## 2016-11-07 NOTE — Anesthesia Procedure Notes (Signed)
Procedure Name: LMA Insertion Date/Time: 11/07/2016 10:43 AM Performed by: Doreen Salvage Pre-anesthesia Checklist: Patient identified, Patient being monitored, Timeout performed, Emergency Drugs available and Suction available Patient Re-evaluated:Patient Re-evaluated prior to inductionOxygen Delivery Method: Circle system utilized Preoxygenation: Pre-oxygenation with 100% oxygen Intubation Type: IV induction Ventilation: Mask ventilation without difficulty LMA: LMA inserted LMA Size: 3.5 Tube type: Oral Number of attempts: 1 Placement Confirmation: positive ETCO2 and breath sounds checked- equal and bilateral Tube secured with: Tape Dental Injury: Teeth and Oropharynx as per pre-operative assessment

## 2016-11-08 ENCOUNTER — Encounter: Payer: Self-pay | Admitting: General Surgery

## 2016-11-08 LAB — SURGICAL PATHOLOGY

## 2016-11-09 NOTE — Progress Notes (Signed)
  Oncology Nurse Navigator Documentation  Navigator Location: [facility] (11/08/16 1209)   )Navigator Encounter Type: Telephone;Treatment (11/08/16 1209) Telephone: Lahoma Crocker Call;Appt Confirmation/Clarification (11/08/16 1209) Abnormal Finding Date: 10/02/16 (11/08/16 1209) Confirmed Diagnosis Date: 10/16/16 (11/08/16 1209) Surgery Date: 11/07/16 (11/08/16 1209)           Treatment Initiated Date: 11/07/16 (11/08/16 1209) Patient Visit Type: Follow-up;Surgery (11/08/16 1209)       Interventions: Education (11/08/16 1209)     Education Method: Verbal;Written (11/08/16 1209)                Time Spent with Patient: 60 (11/08/16 1209)   Met patient and family on day of surgery Gave Breast Cancer Treatment handbook/folder with hospital services, and introduced Navigation service.  Patient phoned 1 day post -op and states she is doing well.  Her daughter, who is a Marine scientist took her "somewhere so she could relax and heal".   Confirmed follow-up appointment with Dr. Bary Castilla.  Will follow if referral made to [facility], dependent on treatment plan, and as needed.

## 2016-11-10 ENCOUNTER — Ambulatory Visit (INDEPENDENT_AMBULATORY_CARE_PROVIDER_SITE_OTHER): Payer: Medicare Other | Admitting: *Deleted

## 2016-11-10 DIAGNOSIS — C50911 Malignant neoplasm of unspecified site of right female breast: Secondary | ICD-10-CM

## 2016-11-10 NOTE — Progress Notes (Signed)
Patient came in today for a wound check right mastectomy done on 11/07/2016.  The wound is clean, with no signs of infection noted. Rewrapped patient.Follow up as scheduled.

## 2016-11-13 ENCOUNTER — Ambulatory Visit: Payer: Medicare Other

## 2016-11-13 DIAGNOSIS — C50911 Malignant neoplasm of unspecified site of right female breast: Secondary | ICD-10-CM

## 2016-11-13 NOTE — Progress Notes (Signed)
Patient ID: Sandra [name], female   DOB: 1945/11/05, 71 y.o.   MRN: [id number]  Patient came in today for a wound check. Right mastectomy site re wrapped. Drain stripped and patient. Patient to follow up as scheduled.

## 2016-11-14 ENCOUNTER — Encounter: Payer: Self-pay | Admitting: Obstetrics and Gynecology

## 2016-11-14 ENCOUNTER — Other Ambulatory Visit: Payer: Self-pay

## 2016-11-16 ENCOUNTER — Encounter: Payer: Self-pay | Admitting: General Surgery

## 2016-11-16 ENCOUNTER — Ambulatory Visit (INDEPENDENT_AMBULATORY_CARE_PROVIDER_SITE_OTHER): Payer: Medicare Other | Admitting: General Surgery

## 2016-11-16 VITALS — BP 114/80 | HR 67 | Resp 12 | Ht 63.0 in | Wt 128.0 lb

## 2016-11-16 DIAGNOSIS — C50911 Malignant neoplasm of unspecified site of right female breast: Secondary | ICD-10-CM

## 2016-11-16 NOTE — Progress Notes (Signed)
Patient ID: Sandra [name], female   DOB: [birth year], 71 y.o.   MRN: [id number]  Chief Complaint  Patient presents with  . Routine Post Op    HPI Sandra [name] is a 71 y.o. female.  Here today for her postoperative visit, right mastectomy 11-07-16. She is using the pain medication occasionally at night. Her husband feels like she is doing too much at home. She is here with her husband, Emme Rosenau.  HPI  Past Medical History:  Diagnosis Date  . Anemia   . Anxiety   . Arthritis   . Breast cancer ([place]) 10/16/2016   T1b,N0;, GRADE I/III, SPANNING AT LEAST 1.0 CM. ER/PR pos  HER2 not over expressed  . Celiac disease   . Depression   . GERD (gastroesophageal reflux disease)    OCC  . Headache    H/O MIGRAINES AS TEENAGER  . Heart murmur    PT WAS TOLD ONCE SHE HAD MVP AND THEN ANOTHER MD SAID SHE DID NOT HAVE MVP  . Hypothyroidism   . Pneumonia    YEARS AGO  . Raynaud's disease   . RLS (restless legs syndrome)     Past Surgical History:  Procedure Laterality Date  . ABDOMINAL HYSTERECTOMY    . APPENDECTOMY    . CATARACT EXTRACTION Bilateral   . CESAREAN SECTION    . COLONOSCOPY WITH PROPOFOL N/A 05/19/2015   Procedure: COLONOSCOPY WITH PROPOFOL;  Surgeon: Manya Silvas, MD;  Location: [facility] ENDOSCOPY;  Service: Endoscopy;  Laterality: N/A;  . EYE SURGERY    . HAND SURGERY Left   . SENTINEL NODE BIOPSY Right 11/07/2016   Procedure: SENTINEL NODE BIOPSY;  Surgeon: Robert Bellow, MD;  Location: [facility] ORS;  Service: General;  Laterality: Right;  . SIMPLE MASTECTOMY WITH AXILLARY SENTINEL NODE BIOPSY Right 11/07/2016   Procedure: SIMPLE MASTECTOMY;  Surgeon: Robert Bellow, MD;  Location: [facility] ORS;  Service: General;  Laterality: Right;  . TONSILLECTOMY  AGE 39  . UPPER GI ENDOSCOPY  01/12/06   hiatus hernia    Family History  Problem Relation Age of Onset  . Cancer Mother   . Anemia Mother   . Other Mother        lymphosarcoma  . Alcohol abuse Father   .  Prostate cancer Father   . Breast cancer Sister        49; 1/2 sister, shared mother.  . Stroke Maternal Grandmother   . Breast cancer Maternal Grandmother 68  . Heart attack Maternal Grandfather   . CAD Paternal Grandfather     Social History Social History  Substance Use Topics  . Smoking status: Never Smoker  . Smokeless tobacco: Never Used  . Alcohol use Yes     Comment: a glass of wine once in a while    Allergies  Allergen Reactions  . Benadryl [Diphenhydramine Hcl (Sleep)] Other (See Comments)    Hyperactivity/causes opposite effect  . Advil [Ibuprofen] Rash    CAN TAKE NAPROSYN  . Claritin [Loratadine] Rash    Current Outpatient Prescriptions  Medication Sig Dispense Refill  . alendronate (FOSAMAX) 70 MG tablet TAKE 1 TABLET BY MOUTH  EVERY WEEK (Patient taking differently: TAKE 1 TABLET BY MOUTH  EVERY WEEK (SATURDAYS) *DO NOT EAT FOR 1 HOUR*) 12 tablet 3  . ALPRAZolam (XANAX) 0.5 MG tablet Take 1 tablet (0.5 mg total) by mouth at bedtime as needed for anxiety. (Patient taking differently: Take 0.5 mg by mouth at bedtime. FOR RLS) 90  tablet 1  . Calcium Carbonate (CALCIUM 600 PO) Take 600 mg by mouth daily at 3 pm.     . Cholecalciferol (VITAMIN D3) 10000 units TABS Take 1,000 Units by mouth daily at 3 pm.     . conjugated estrogens (PREMARIN) vaginal cream Place 1 Applicatorful vaginally daily. Use 1/2 gram every day x 12 weeks, then decrease to twice weekly 42.5 g 4  . HYDROcodone-acetaminophen (NORCO) 5-325 MG tablet Take 1-2 tablets by mouth every 4 (four) hours as needed for moderate pain. 30 tablet 0  . hydroxypropyl methylcellulose / hypromellose (ISOPTO TEARS / GONIOVISC) 2.5 % ophthalmic solution Place 1 drop into both eyes 4 (four) times daily as needed for dry eyes (scheduled each morning & then as needed).    [name] levothyroxine (SYNTHROID, LEVOTHROID) 100 MCG tablet TAKE 1 TABLET BY MOUTH  DAILY (Patient taking differently: TAKE 1 TABLET (100 MCG) BY MOUTH   BEFORE BREAKFAST *30 MINS PRIOR TO BREAKFAST*) 90 tablet 3  . Multiple Vitamin (MULTIVITAMIN WITH MINERALS) TABS tablet Take 1 tablet by mouth daily at 3 pm.    . Omega-3 Fatty Acids (FISH OIL PO) Take 1 capsule by mouth daily.     . Probiotic Product (ALIGN) 4 MG CAPS Take 4 mg by mouth 2 (two) times a week.    . Psyllium (EQ DAILY FIBER PO) Take 1 tablet by mouth 2 (two) times a week. Takes with align probiotic.    [name] senna (SENOKOT) 8.6 MG TABS tablet Take 1 tablet by mouth daily as needed (for constipation).    . sennosides-docusate sodium (SENOKOT-S) 8.6-50 MG tablet Take 1 tablet by mouth daily. *hold for constipation*    . sertraline (ZOLOFT) 100 MG tablet Take 1 tablet (100 mg total) by mouth daily. (Patient taking differently: Take 100 mg by mouth at bedtime. ) 90 tablet 3  . Simethicone (GAS-X PO) Take 1 tablet by mouth as needed.    [name] VITAMIN E PO Take 1 tablet by mouth as needed.     No current facility-administered medications for this visit.     Review of Systems Review of Systems  Constitutional: Negative.   Respiratory: Negative.   Cardiovascular: Negative.     Blood pressure 114/80, pulse 67, resp. rate 12, height _0  (1.6 m), weight 128 lb (58.1 kg).  Physical Exam Physical Exam  Constitutional: She is oriented to person, place, and time. She appears well-developed and well-nourished.  Pulmonary/Chest:    Full shoulder range of motion.  No upper extremity edema.  Musculoskeletal:  Upper extremity range of motion improving   Neurological: She is alert and oriented to person, place, and time.  Skin: Skin is warm and dry.  Psychiatric: Her behavior is normal.    Data Reviewed T1b,N0;ER/PR+; Her 2 neu not overexpressed.  Drain volumes still running 40-50 mL per day. Assessment    Doing well status post right mastectomy with sentinel node biopsy.    Plan    Persistent drainage likely secondary to the vigorous activity the patient has been doing since  surgery.  Candidate for antiestrogen therapy and cessation of vaginal hormone supplementation.      Return in one week.  Recommend avoid doing repetitive motions. May shower.    HPI, Physical Exam, Assessment and Plan have been scribed under the direction and in the presence of Robert Bellow, MD.  Karie Fetch, RN   Robert Bellow 11/16/2016, 4:37 PM

## 2016-11-16 NOTE — Patient Instructions (Addendum)
Return in one week.  Recommend avoid doing repetitive motions. May shower

## 2016-11-21 ENCOUNTER — Telehealth: Payer: Self-pay

## 2016-11-21 NOTE — Telephone Encounter (Signed)
Patient called concerned with pain and discomfort in her right axilla area where she had lymph nodes removed. She states that she is uncomfortable mostly at night but has some trouble during the day also. She has been trying to avoid using her prescription pain medication. I advised the patient to use heat to the area and to use the pain medication at night to help her sleep comfortably. She denies any fevers, chills, or body aches. She reports that her drainage has been going down and is now about 40 cc daily. She will try the heating pad and let us know if this does not help.

## 2016-11-23 ENCOUNTER — Encounter: Payer: Self-pay | Admitting: General Surgery

## 2016-11-23 ENCOUNTER — Ambulatory Visit (INDEPENDENT_AMBULATORY_CARE_PROVIDER_SITE_OTHER): Payer: Medicare Other | Admitting: General Surgery

## 2016-11-23 VITALS — BP 120/82 | HR 70 | Resp 12 | Ht 63.0 in | Wt 131.0 lb

## 2016-11-23 DIAGNOSIS — C50911 Malignant neoplasm of unspecified site of right female breast: Secondary | ICD-10-CM

## 2016-11-23 NOTE — Patient Instructions (Addendum)
The patient is aware to call back for any questions or concerns. May remove dressing on Saturday and apply band aid as needed

## 2016-11-23 NOTE — Progress Notes (Signed)
Patient ID: Sandra [name], female   DOB: 1945/08/03, 71 y.o.   MRN: [id number]  Chief Complaint  Patient presents with  . Routine Post Op    HPI Sandra [name] is a 71 y.o. female.  Here today for her postoperative visit, right mastectomy 11-07-16. Pain more lateral and axillary area. She is using the pain medication occasionally at night. She states she has not done as much at home this week. She is here with her husband, Nastasha Reising. HPI  Past Medical History:  Diagnosis Date  . Anemia   . Anxiety   . Arthritis   . Breast cancer ([place]) 10/16/2016   T1b,N0;, GRADE I/III, SPANNING AT LEAST 1.0 CM. ER/PR pos  HER2 not over expressed  . Celiac disease   . Depression   . GERD (gastroesophageal reflux disease)    OCC  . Headache    H/O MIGRAINES AS TEENAGER  . Heart murmur    PT WAS TOLD ONCE SHE HAD MVP AND THEN ANOTHER MD SAID SHE DID NOT HAVE MVP  . Hypothyroidism   . Pneumonia    YEARS AGO  . Raynaud's disease   . RLS (restless legs syndrome)     Past Surgical History:  Procedure Laterality Date  . ABDOMINAL HYSTERECTOMY    . APPENDECTOMY    . CATARACT EXTRACTION Bilateral   . CESAREAN SECTION    . COLONOSCOPY WITH PROPOFOL N/A 05/19/2015   Procedure: COLONOSCOPY WITH PROPOFOL;  Surgeon: Manya Silvas, MD;  Location: [facility] ENDOSCOPY;  Service: Endoscopy;  Laterality: N/A;  . EYE SURGERY    . HAND SURGERY Left   . SENTINEL NODE BIOPSY Right 11/07/2016   Procedure: SENTINEL NODE BIOPSY;  Surgeon: Robert Bellow, MD;  Location: [facility] ORS;  Service: General;  Laterality: Right;  . SIMPLE MASTECTOMY WITH AXILLARY SENTINEL NODE BIOPSY Right 11/07/2016   Procedure: SIMPLE MASTECTOMY;  Surgeon: Robert Bellow, MD;  Location: [facility] ORS;  Service: General;  Laterality: Right;  . TONSILLECTOMY  AGE 38  . UPPER GI ENDOSCOPY  01/12/06   hiatus hernia    Family History  Problem Relation Age of Onset  . Cancer Mother   . Anemia Mother   . Other Mother    lymphosarcoma  . Alcohol abuse Father   . Prostate cancer Father   . Breast cancer Sister        92; 1/2 sister, shared mother.  . Stroke Maternal Grandmother   . Breast cancer Maternal Grandmother 68  . Heart attack Maternal Grandfather   . CAD Paternal Grandfather     Social History Social History  Substance Use Topics  . Smoking status: Never Smoker  . Smokeless tobacco: Never Used  . Alcohol use Yes     Comment: a glass of wine once in a while    Allergies  Allergen Reactions  . Benadryl [Diphenhydramine Hcl (Sleep)] Other (See Comments)    Hyperactivity/causes opposite effect  . Advil [Ibuprofen] Rash    CAN TAKE NAPROSYN  . Claritin [Loratadine] Rash    Current Outpatient Prescriptions  Medication Sig Dispense Refill  . alendronate (FOSAMAX) 70 MG tablet TAKE 1 TABLET BY MOUTH  EVERY WEEK (Patient taking differently: TAKE 1 TABLET BY MOUTH  EVERY WEEK (SATURDAYS) *DO NOT EAT FOR 1 HOUR*) 12 tablet 3  . ALPRAZolam (XANAX) 0.5 MG tablet Take 1 tablet (0.5 mg total) by mouth at bedtime as needed for anxiety. (Patient taking differently: Take 0.5 mg by mouth at bedtime. FOR RLS)  90 tablet 1  . Calcium Carbonate (CALCIUM 600 PO) Take 600 mg by mouth daily at 3 pm.     . Cholecalciferol (VITAMIN D3) 10000 units TABS Take 1,000 Units by mouth daily at 3 pm.     . conjugated estrogens (PREMARIN) vaginal cream Place 1 Applicatorful vaginally daily. Use 1/2 gram every day x 12 weeks, then decrease to twice weekly 42.5 g 4  . hydroxypropyl methylcellulose / hypromellose (ISOPTO TEARS / GONIOVISC) 2.5 % ophthalmic solution Place 1 drop into both eyes 4 (four) times daily as needed for dry eyes (scheduled each morning & then as needed).    [name] levothyroxine (SYNTHROID, LEVOTHROID) 100 MCG tablet TAKE 1 TABLET BY MOUTH  DAILY (Patient taking differently: TAKE 1 TABLET (100 MCG) BY MOUTH  BEFORE BREAKFAST *30 MINS PRIOR TO BREAKFAST*) 90 tablet 3  . Multiple Vitamin (MULTIVITAMIN WITH  MINERALS) TABS tablet Take 1 tablet by mouth daily at 3 pm.    . Omega-3 Fatty Acids (FISH OIL PO) Take 1 capsule by mouth daily.     . Probiotic Product (ALIGN) 4 MG CAPS Take 4 mg by mouth 2 (two) times a week.    . Psyllium (EQ DAILY FIBER PO) Take 1 tablet by mouth 2 (two) times a week. Takes with align probiotic.    [name] senna (SENOKOT) 8.6 MG TABS tablet Take 1 tablet by mouth daily as needed (for constipation).    . sennosides-docusate sodium (SENOKOT-S) 8.6-50 MG tablet Take 1 tablet by mouth daily. *hold for constipation*    . sertraline (ZOLOFT) 100 MG tablet Take 1 tablet (100 mg total) by mouth daily. (Patient taking differently: Take 100 mg by mouth at bedtime. ) 90 tablet 3  . Simethicone (GAS-X PO) Take 1 tablet by mouth as needed.    [name] VITAMIN E PO Take 1 tablet by mouth as needed.     No current facility-administered medications for this visit.     Review of Systems Review of Systems  Constitutional: Negative.   Respiratory: Negative.   Cardiovascular: Negative.     Blood pressure 120/82, pulse 70, resp. rate 12, height 5' 3"  (1.6 m), weight 131 lb (59.4 kg).  Physical Exam Physical Exam  Constitutional: She is oriented to person, place, and time. She appears well-developed and well-nourished.  HENT:  Mouth/Throat: Oropharynx is clear and moist.  Eyes: Conjunctivae are normal. No scleral icterus.  Neck: Neck supple.  Cardiovascular: Normal rate, regular rhythm and normal heart sounds.   Pulmonary/Chest: Effort normal and breath sounds normal.  Drain removed, steri strips intact  Musculoskeletal:  Upper extremity much improved  Lymphadenopathy:    She has no cervical adenopathy.  Neurological: She is alert and oriented to person, place, and time.  Skin: Skin is warm and dry.  Psychiatric: Her behavior is normal.    Data Reviewed Drain record sheet reviewed, running about 30-35 mL per day. Drain removed.  Assessment    Doing well status post right simple  mastectomy.    Plan         Follow up in one week. May remove dressing on Saturday and apply band aid as needed    HPI, Physical Exam, Assessment and Plan have been scribed under the direction and in the presence of Robert Bellow, MD. Karie Fetch, RN  I have completed the exam and reviewed the above documentation for accuracy and completeness.  I agree with the above.  Haematologist has been used and any errors in dictation or  transcription are unintentional.  Hervey Ard, M.D., F.A.C.S.  Robert Bellow 11/24/2016, 8:30 PM

## 2016-11-30 ENCOUNTER — Encounter: Payer: Self-pay | Admitting: General Surgery

## 2016-11-30 ENCOUNTER — Ambulatory Visit (INDEPENDENT_AMBULATORY_CARE_PROVIDER_SITE_OTHER): Payer: Medicare Other | Admitting: General Surgery

## 2016-11-30 VITALS — BP 126/68 | HR 76 | Resp 14 | Ht 63.0 in | Wt 128.0 lb

## 2016-11-30 DIAGNOSIS — C50911 Malignant neoplasm of unspecified site of right female breast: Secondary | ICD-10-CM

## 2016-11-30 NOTE — Patient Instructions (Addendum)
Follow-up in two weeks

## 2016-11-30 NOTE — Progress Notes (Signed)
Patient ID: Sandra [name], female   DOB: 06-16-1945, 71 y.o.   MRN: [id number]  Chief Complaint  Patient presents with  . Follow-up    HPI Sandra [name] is a 71 y.o. female is here today for a right mastectomy follow up. Patient states she is doing well. Taking Motrin as needed for pain. HPI   Past Medical History:  Diagnosis Date  . Anemia   . Anxiety   . Arthritis   . BRCA gene mutation negative 10/2016   [facility]  . Breast cancer ([place]) 10/16/2016   T1c,N0;, GRADE I/III, 1.6cm. ER/PR pos  HER2 not over expressed  . Celiac disease   . Depression   . GERD (gastroesophageal reflux disease)    OCC  . Headache    H/O MIGRAINES AS TEENAGER  . Heart murmur    PT WAS TOLD ONCE SHE HAD MVP AND THEN ANOTHER MD SAID SHE DID NOT HAVE MVP  . Hypothyroidism   . Pneumonia    YEARS AGO  . Raynaud's disease   . RLS (restless legs syndrome)     Past Surgical History:  Procedure Laterality Date  . ABDOMINAL HYSTERECTOMY    . APPENDECTOMY    . CATARACT EXTRACTION Bilateral   . CESAREAN SECTION    . COLONOSCOPY WITH PROPOFOL N/A 05/19/2015   Procedure: COLONOSCOPY WITH PROPOFOL;  Surgeon: Manya Silvas, MD;  Location: [facility] ENDOSCOPY;  Service: Endoscopy;  Laterality: N/A;  . EYE SURGERY    . HAND SURGERY Left   . SENTINEL NODE BIOPSY Right 11/07/2016   Procedure: SENTINEL NODE BIOPSY;  Surgeon: Robert Bellow, MD;  Location: [facility] ORS;  Service: General;  Laterality: Right;  . SIMPLE MASTECTOMY WITH AXILLARY SENTINEL NODE BIOPSY Right 11/07/2016   Procedure: SIMPLE MASTECTOMY;  Surgeon: Robert Bellow, MD;  Location: [facility] ORS;  Service: General;  Laterality: Right;  . TONSILLECTOMY  AGE 81  . UPPER GI ENDOSCOPY  01/12/06   hiatus hernia    Family History  Problem Relation Age of Onset  . Cancer Mother   . Anemia Mother   . Other Mother        lymphosarcoma  . Alcohol abuse Father   . Prostate cancer Father   . Breast cancer Sister        7; 1/2 sister, shared  mother.  . Stroke Maternal Grandmother   . Breast cancer Maternal Grandmother 68  . Heart attack Maternal Grandfather   . CAD Paternal Grandfather     Social History Social History  Substance Use Topics  . Smoking status: Never Smoker  . Smokeless tobacco: Never Used  . Alcohol use Yes     Comment: a glass of wine once in a while    Allergies  Allergen Reactions  . Benadryl [Diphenhydramine Hcl (Sleep)] Other (See Comments)    Hyperactivity/causes opposite effect  . Advil [Ibuprofen] Rash    CAN TAKE NAPROSYN  . Claritin [Loratadine] Rash    Current Outpatient Prescriptions  Medication Sig Dispense Refill  . alendronate (FOSAMAX) 70 MG tablet TAKE 1 TABLET BY MOUTH  EVERY WEEK (Patient taking differently: TAKE 1 TABLET BY MOUTH  EVERY WEEK (SATURDAYS) *DO NOT EAT FOR 1 HOUR*) 12 tablet 3  . ALPRAZolam (XANAX) 0.5 MG tablet Take 1 tablet (0.5 mg total) by mouth at bedtime as needed for anxiety. (Patient taking differently: Take 0.5 mg by mouth at bedtime. FOR RLS) 90 tablet 1  . Calcium Carbonate (CALCIUM 600 PO) Take 600 mg by  mouth daily at 3 pm.     . Cholecalciferol (VITAMIN D3) 10000 units TABS Take 1,000 Units by mouth daily at 3 pm.     . conjugated estrogens (PREMARIN) vaginal cream Place 1 Applicatorful vaginally daily. Use 1/2 gram every day x 12 weeks, then decrease to twice weekly 42.5 g 4  . hydroxypropyl methylcellulose / hypromellose (ISOPTO TEARS / GONIOVISC) 2.5 % ophthalmic solution Place 1 drop into both eyes 4 (four) times daily as needed for dry eyes (scheduled each morning & then as needed).    [name] levothyroxine (SYNTHROID, LEVOTHROID) 100 MCG tablet TAKE 1 TABLET BY MOUTH  DAILY (Patient taking differently: TAKE 1 TABLET (100 MCG) BY MOUTH  BEFORE BREAKFAST *30 MINS PRIOR TO BREAKFAST*) 90 tablet 3  . Multiple Vitamin (MULTIVITAMIN WITH MINERALS) TABS tablet Take 1 tablet by mouth daily at 3 pm.    . Omega-3 Fatty Acids (FISH OIL PO) Take 1 capsule by mouth  daily.     . Probiotic Product (ALIGN) 4 MG CAPS Take 4 mg by mouth 2 (two) times a week.    . Psyllium (EQ DAILY FIBER PO) Take 1 tablet by mouth 2 (two) times a week. Takes with align probiotic.    [name] senna (SENOKOT) 8.6 MG TABS tablet Take 1 tablet by mouth daily as needed (for constipation).    . sennosides-docusate sodium (SENOKOT-S) 8.6-50 MG tablet Take 1 tablet by mouth daily. *hold for constipation*    . sertraline (ZOLOFT) 100 MG tablet Take 1 tablet (100 mg total) by mouth daily. (Patient taking differently: Take 100 mg by mouth at bedtime. ) 90 tablet 3  . Simethicone (GAS-X PO) Take 1 tablet by mouth as needed.    [name] VITAMIN E PO Take 1 tablet by mouth as needed.     No current facility-administered medications for this visit.     Review of Systems Review of Systems  Constitutional: Negative.   Respiratory: Negative.   Cardiovascular: Negative.     Blood pressure 126/68, pulse 76, resp. rate 14, height 5' 3"  (1.6 m), weight 128 lb (58.1 kg).  Physical Exam Physical Exam  Constitutional: She is oriented to person, place, and time. She appears well-developed and well-nourished.  Pulmonary/Chest:    Right Mastectomy site healing well Drained 1 oz of fluid from  site.  Neurological: She is alert and oriented to person, place, and time.  Skin: Skin is warm and dry.    Data Reviewed The original biopsy showed the tumor spanning 1 cm. There was 6 mm of residual disease on mastectomy specimen. In discussion with Bryan Lemma, M.D. from pathology, addition of the biopsy and residual site should not be added for final tumor dimension. This was a grade 1 (of 3) low-grade tumor. ER/PR 100%; HER-2/neu not overexpressed.  After discussion with Dr. Dicie Beam, will put on hold a recommendation for Mammoprint testing.    Assessment    Study progress status post right mastectomy.  Candidate for antiestrogen treatment.  Ongoing vaginal estrogen use.    Plan        The patient  may be as active as she is comfortable.  Will discuss options for hormone treatment at her upcoming appointment.   Follow up in 2 weeks.      HPI, Physical Exam, Assessment and Plan have been scribed under the direction and in the presence of Hervey Ard, MD.  Verlene Mayer, CMA  I have completed the exam and reviewed the above documentation for accuracy and completeness.  I agree with the above.  Haematologist has been used and any errors in dictation or transcription are unintentional.  Hervey Ard, M.D., F.A.C.S.   Robert Bellow 11/30/2016, 10:31 AM

## 2016-12-04 ENCOUNTER — Encounter: Payer: Self-pay | Admitting: Family Medicine

## 2016-12-04 ENCOUNTER — Ambulatory Visit (INDEPENDENT_AMBULATORY_CARE_PROVIDER_SITE_OTHER): Payer: Medicare Other | Admitting: Family Medicine

## 2016-12-04 VITALS — BP 160/84 | HR 62 | Temp 97.4°F | Resp 16 | Wt 129.0 lb

## 2016-12-04 DIAGNOSIS — F411 Generalized anxiety disorder: Secondary | ICD-10-CM | POA: Diagnosis not present

## 2016-12-04 DIAGNOSIS — R9431 Abnormal electrocardiogram [ECG] [EKG]: Secondary | ICD-10-CM

## 2016-12-04 DIAGNOSIS — K219 Gastro-esophageal reflux disease without esophagitis: Secondary | ICD-10-CM | POA: Diagnosis not present

## 2016-12-04 DIAGNOSIS — E784 Other hyperlipidemia: Secondary | ICD-10-CM

## 2016-12-04 DIAGNOSIS — F419 Anxiety disorder, unspecified: Secondary | ICD-10-CM | POA: Diagnosis not present

## 2016-12-04 DIAGNOSIS — E7849 Other hyperlipidemia: Secondary | ICD-10-CM

## 2016-12-04 DIAGNOSIS — E038 Other specified hypothyroidism: Secondary | ICD-10-CM | POA: Diagnosis not present

## 2016-12-04 DIAGNOSIS — R1310 Dysphagia, unspecified: Secondary | ICD-10-CM

## 2016-12-04 MED ORDER — ALPRAZOLAM 0.5 MG PO TABS: 0.5000 mg | ORAL_TABLET | Freq: Every evening | ORAL | 1 refills | Status: DC | PRN

## 2016-12-04 MED ORDER — OMEPRAZOLE 20 MG PO CPDR: 20.0000 mg | DELAYED_RELEASE_CAPSULE | Freq: Every day | ORAL | 3 refills | Status: DC

## 2016-12-04 NOTE — Progress Notes (Signed)
Subjective:  HPI Pt is here for a 2 month follow up. She has been through a lot since last OV. She was diagnosed with breast cancer and had a right breast mastectomy on June 12 th. She doing well emotionally and being back on the sertraline has helped. She had one week after the surgery that she was depressed but she is better now that she can do more things. She is concerned and wanting another referral cardiology. She saw Dr. Clayborn Bigness because she had an abnormal EKG. She does not want to see Dr. Clayborn Bigness anymore she [facility] to [name]  On questioning she is having some dysphagia which is very nonspecific.Not with solids only and not with all po intake.  Prior to Admission medications   Medication Sig Start Date End Date Taking? Authorizing Provider  alendronate (FOSAMAX) 70 MG tablet TAKE 1 TABLET BY MOUTH  EVERY WEEK Patient taking differently: TAKE 1 TABLET BY MOUTH  EVERY WEEK (SATURDAYS) *DO NOT EAT FOR 1 HOUR* 05/01/16   Jerrol Banana., MD  ALPRAZolam Duanne Moron) 0.5 MG tablet Take 1 tablet (0.5 mg total) by mouth at bedtime as needed for anxiety. Patient taking differently: Take 0.5 mg by mouth at bedtime. FOR RLS 09/19/16   Jerrol Banana., MD  Calcium Carbonate (CALCIUM 600 PO) Take 600 mg by mouth daily at 3 pm.     [provider]  Cholecalciferol (VITAMIN D3) 10000 units TABS Take 1,000 Units by mouth daily at 3 pm.     [provider]  conjugated estrogens (PREMARIN) vaginal cream Place 1 Applicatorful vaginally daily. Use 1/2 gram every day x 12 weeks, then decrease to twice weekly 08/24/16   Rubie Maid, MD  hydroxypropyl methylcellulose / hypromellose (ISOPTO TEARS / GONIOVISC) 2.5 % ophthalmic solution Place 1 drop into both eyes 4 (four) times daily as needed for dry eyes (scheduled each morning & then as needed).    [provider]  levothyroxine (SYNTHROID, LEVOTHROID) 100 MCG tablet TAKE 1 TABLET BY  MOUTH  DAILY Patient taking differently: TAKE 1 TABLET (100 MCG) BY MOUTH  BEFORE BREAKFAST *30 MINS PRIOR TO BREAKFAST* 09/13/16   Jerrol Banana., MD  Multiple Vitamin (MULTIVITAMIN WITH MINERALS) TABS tablet Take 1 tablet by mouth daily at 3 pm.    [provider]  Omega-3 Fatty Acids (FISH OIL PO) Take 1 capsule by mouth daily.     [provider]  Probiotic Product (ALIGN) 4 MG CAPS Take 4 mg by mouth 2 (two) times a week.    [provider]  Psyllium (EQ DAILY FIBER PO) Take 1 tablet by mouth 2 (two) times a week. Takes with align probiotic.    [provider]  senna (SENOKOT) 8.6 MG TABS tablet Take 1 tablet by mouth daily as needed (for constipation).    [provider]  sennosides-docusate sodium (SENOKOT-S) 8.6-50 MG tablet Take 1 tablet by mouth daily. *hold for constipation*    [provider]  sertraline (ZOLOFT) 100 MG tablet Take 1 tablet (100 mg total) by mouth daily. Patient taking differently: Take 100 mg by mouth at bedtime.  09/28/16   Jerrol Banana., MD  Simethicone (GAS-X PO) Take 1 tablet by mouth as needed.    [provider]  VITAMIN E PO Take 1 tablet by mouth as needed.    [provider]    Patient Active Problem List  Diagnosis Date Noted  . Invasive ductal carcinoma of breast, female, right ([place]) 10/20/2016  . Prolapse of vaginal vault after hysterectomy 08/24/2016  . Vaginal atrophy 08/24/2016  . Dyspareunia in female 08/24/2016  . Colon polyps 01/06/2015  . BCC (basal cell carcinoma of skin) 01/06/2015  . Anemia 01/06/2015  . Hypothyroidism 01/06/2015  . Vitamin D deficiency 01/06/2015  . Hyperlipidemia 01/06/2015  . Depression, major 01/06/2015  . GAD (generalized anxiety disorder) 01/06/2015  . RLS (restless legs syndrome) 01/06/2015  . Cataract 01/06/2015  . Raynaud phenomenon 01/06/2015  . GERD (gastroesophageal reflux disease) 01/06/2015  . Celiac disease  01/06/2015  . OA (osteoarthritis) 01/06/2015  . Osteoporosis 01/06/2015  . Pre-diabetes 01/06/2015  . Avitaminosis D 01/06/2015  . Raynaud's syndrome without gangrene 01/06/2015  . Age-related osteoporosis without current pathological fracture 01/06/2015  . Anxiety disorder 01/05/2015    Past Medical History:  Diagnosis Date  . Anemia   . Anxiety   . Arthritis   . BRCA gene mutation negative 10/2016   [facility]  . Breast cancer ([place]) 10/16/2016   T1c,N0;, GRADE I/III, 1.6cm. ER/PR pos  HER2 not over expressed  . Celiac disease   . Depression   . GERD (gastroesophageal reflux disease)    OCC  . Headache    H/O MIGRAINES AS TEENAGER  . Heart murmur    PT WAS TOLD ONCE SHE HAD MVP AND THEN ANOTHER MD SAID SHE DID NOT HAVE MVP  . Hypothyroidism   . Pneumonia    YEARS AGO  . Raynaud's disease   . RLS (restless legs syndrome)     Social History   Social History  . Marital status: Married    Spouse name: N/A  . Number of children: N/A  . Years of education: N/A   Occupational History  . Not on file.   Social History Main Topics  . Smoking status: Never Smoker  . Smokeless tobacco: Never Used  . Alcohol use Yes     Comment: a glass of wine once in a while  . Drug use: No  . Sexual activity: Not Currently    Birth control/ protection: None   Other Topics Concern  . Not on file   Social History Narrative  . No narrative on file    Allergies  Allergen Reactions  . Benadryl [Diphenhydramine Hcl (Sleep)] Other (See Comments)    Hyperactivity/causes opposite effect  . Advil [Ibuprofen] Rash    CAN TAKE NAPROSYN  . Claritin [Loratadine] Rash    Review of Systems  Constitutional: Negative.   HENT: Negative.   Eyes: Negative.   Respiratory: Negative.   Cardiovascular: Positive for chest pain (from mastectomy).  Gastrointestinal: Negative.   Genitourinary: Negative.   Musculoskeletal: Negative.   Skin: Negative.   Neurological: Negative.     Endo/Heme/Allergies: Negative.   Psychiatric/Behavioral: Negative.     Immunization History  Administered Date(s) Administered  . Influenza, High Dose Seasonal PF 03/04/2015, 04/07/2016  . Pneumococcal Conjugate-13 02/05/2014  . Pneumococcal Polysaccharide-23 04/07/2016  . Tdap 03/18/2008    Objective:  BP (!) 160/84 (BP Location: Left Arm, Patient Position: Sitting, Cuff Size: Normal)   Pulse 62   Temp (!) 97.4 F (36.3 C) (Oral)   Resp 16   Wt 129 lb (58.5 kg)   BMI 22.85 kg/m   Physical Exam  Constitutional: She is oriented to person, place, and time and well-developed, well-nourished, and in no distress.  HENT:  Head: Normocephalic and atraumatic.  Right Ear: External ear normal.  Left Ear: External ear normal.  Eyes: Conjunctivae are normal. No scleral icterus.  Neck: Neck supple. No thyromegaly present.  Cardiovascular: Normal rate, regular rhythm and normal heart sounds.   Pulmonary/Chest: Effort normal and breath sounds normal.  Abdominal: Soft.  Lymphadenopathy:    She has no cervical adenopathy.  Neurological: She is alert and oriented to person, place, and time. GCS score is 15.  Skin: Skin is warm and dry.  Fair skin.  Psychiatric: Mood, memory, affect and judgment normal.    Lab Results  Component Value Date   WBC 5.0 05/31/2016   HGB 13.2 05/31/2016   HCT 38.3 05/31/2016   PLT 275 05/31/2016   GLUCOSE 98 05/31/2016   CHOL 180 05/31/2016   TRIG 155 (H) 05/31/2016   HDL 43 05/31/2016   LDLCALC 106 (H) 05/31/2016   TSH 3.350 05/31/2016   HGBA1C 5.6 05/31/2016    CMP     Component Value Date/Time   NA 139 05/31/2016 1130   K 4.3 05/31/2016 1130   CL 100 05/31/2016 1130   CO2 26 05/31/2016 1130   GLUCOSE 98 05/31/2016 1130   BUN 12 05/31/2016 1130   CREATININE 0.60 05/31/2016 1130   CALCIUM 8.9 05/31/2016 1130   PROT 6.7 05/31/2016 1130   ALBUMIN 4.1 05/31/2016 1130   AST 19 05/31/2016 1130   ALT 22 05/31/2016 1130   ALKPHOS 66  05/31/2016 1130   BILITOT 0.4 05/31/2016 1130   GFRNONAA 92 05/31/2016 1130   GFRAA 106 05/31/2016 1130    Assessment and Plan :  1. GAD (generalized anxiety disorder)   2. Other specified hypothyroidism   3. Other hyperlipidemia   4. Gastroesophageal reflux disease, esophagitis presence not specified/Dysphagias Try daily PPI for now. 5. Abnormal EKG Pt saw Dr. Clayborn Bigness prior to breast surgery.Pt would like to see Dr End. - Ambulatory referral to Cardiology 6.Elevated BP In pain from mastectomy. RTC 3 months. HPI, Exam, and A&P Transcribed under the direction and in the presence of Richard L. Cranford Mon, MD  Electronically Signed: Katina Dung, Elim MD [facility] 12/04/2016 11:39 AM

## 2016-12-04 NOTE — Addendum Note (Signed)
Addended by: Ermalinda Barrios on: 12/04/2016 04:58 PM   Modules accepted: Orders

## 2016-12-06 ENCOUNTER — Encounter: Payer: Self-pay | Admitting: General Surgery

## 2016-12-19 ENCOUNTER — Encounter: Payer: Self-pay | Admitting: General Surgery

## 2016-12-19 ENCOUNTER — Ambulatory Visit (INDEPENDENT_AMBULATORY_CARE_PROVIDER_SITE_OTHER): Payer: Medicare Other | Admitting: General Surgery

## 2016-12-19 VITALS — BP 140/78 | HR 68 | Resp 12 | Ht 63.0 in | Wt 127.0 lb

## 2016-12-19 DIAGNOSIS — C50911 Malignant neoplasm of unspecified site of right female breast: Secondary | ICD-10-CM

## 2016-12-19 MED ORDER — LETROZOLE 2.5 MG PO TABS: 2.5000 mg | ORAL_TABLET | Freq: Every day | ORAL | 2 refills | Status: DC

## 2016-12-19 NOTE — Progress Notes (Addendum)
Patient ID: Sandra [name], female   DOB: [birth year], 71 y.o.   MRN: [id number]  Chief Complaint  Patient presents with  . Routine Post Op    HPI Sandra [name] is a 71 y.o. female.  Here for postop visit, right mastectomy on 11-07-16. She states she is doing well. The patient's chest wall discomfort has resolved other she is still appreciating some axillary discomfort. Full shoulder range of motion.  HPI  Past Medical History:  Diagnosis Date  . Anemia   . Anxiety   . Arthritis   . BRCA gene mutation negative 10/2016   [facility]  . Breast cancer ([place]) 10/16/2016   T1c,N0;, GRADE I/III, 1.6cm. ER/PR pos  HER2 not over expressed  . Celiac disease   . Depression   . GERD (gastroesophageal reflux disease)    OCC  . Headache    H/O MIGRAINES AS TEENAGER  . Heart murmur    PT WAS TOLD ONCE SHE HAD MVP AND THEN ANOTHER MD SAID SHE DID NOT HAVE MVP  . Hypothyroidism   . Pneumonia    YEARS AGO  . Raynaud's disease   . RLS (restless legs syndrome)     Past Surgical History:  Procedure Laterality Date  . ABDOMINAL HYSTERECTOMY    . APPENDECTOMY    . CATARACT EXTRACTION Bilateral   . CESAREAN SECTION    . COLONOSCOPY WITH PROPOFOL N/A 05/19/2015   Procedure: COLONOSCOPY WITH PROPOFOL;  Surgeon: Manya Silvas, MD;  Location: [facility] ENDOSCOPY;  Service: Endoscopy;  Laterality: N/A;  . EYE SURGERY    . HAND SURGERY Left   . SENTINEL NODE BIOPSY Right 11/07/2016   Procedure: SENTINEL NODE BIOPSY;  Surgeon: Robert Bellow, MD;  Location: [facility] ORS;  Service: General;  Laterality: Right;  . SIMPLE MASTECTOMY WITH AXILLARY SENTINEL NODE BIOPSY Right 11/07/2016   Procedure: SIMPLE MASTECTOMY;  Surgeon: Robert Bellow, MD;  Location: [facility] ORS;  Service: General;  Laterality: Right;  . TONSILLECTOMY  AGE 24  . UPPER GI ENDOSCOPY  01/12/06   hiatus hernia    Family History  Problem Relation Age of Onset  . Cancer Mother   . Anemia Mother   . Other Mother        lymphosarcoma   . Alcohol abuse Father   . Prostate cancer Father   . Breast cancer Sister        18; 1/2 sister, shared mother.  . Stroke Maternal Grandmother   . Breast cancer Maternal Grandmother 68  . Heart attack Maternal Grandfather   . CAD Paternal Grandfather     Social History Social History  Substance Use Topics  . Smoking status: Never Smoker  . Smokeless tobacco: Never Used  . Alcohol use Yes     Comment: a glass of wine once in a while    Allergies  Allergen Reactions  . Benadryl [Diphenhydramine Hcl (Sleep)] Other (See Comments)    Hyperactivity/causes opposite effect  . Advil [Ibuprofen] Rash    CAN TAKE NAPROSYN  . Claritin [Loratadine] Rash    Current Outpatient Prescriptions  Medication Sig Dispense Refill  . alendronate (FOSAMAX) 70 MG tablet TAKE 1 TABLET BY MOUTH  EVERY WEEK (Patient taking differently: TAKE 1 TABLET BY MOUTH  EVERY WEEK (SATURDAYS) *DO NOT EAT FOR 1 HOUR*) 12 tablet 3  . ALPRAZolam (XANAX) 0.5 MG tablet Take 1 tablet (0.5 mg total) by mouth at bedtime as needed for anxiety. 90 tablet 1  . Calcium Carbonate (CALCIUM 600 PO)  Take 600 mg by mouth daily at 3 pm.     . Cholecalciferol (VITAMIN D3) 10000 units TABS Take 1,000 Units by mouth daily at 3 pm.     . hydroxypropyl methylcellulose / hypromellose (ISOPTO TEARS / GONIOVISC) 2.5 % ophthalmic solution Place 1 drop into both eyes 4 (four) times daily as needed for dry eyes (scheduled each morning & then as needed).    [name] levothyroxine (SYNTHROID, LEVOTHROID) 100 MCG tablet TAKE 1 TABLET BY MOUTH  DAILY (Patient taking differently: TAKE 1 TABLET (100 MCG) BY MOUTH  BEFORE BREAKFAST *30 MINS PRIOR TO BREAKFAST*) 90 tablet 3  . Multiple Vitamin (MULTIVITAMIN WITH MINERALS) TABS tablet Take 1 tablet by mouth daily at 3 pm.    . Omega-3 Fatty Acids (FISH OIL PO) Take 1 capsule by mouth daily.     [name] omeprazole (PRILOSEC) 20 MG capsule Take 1 capsule (20 mg total) by mouth daily. 30 capsule 3  . Probiotic  Product (ALIGN) 4 MG CAPS Take 4 mg by mouth 2 (two) times a week.    . Psyllium (EQ DAILY FIBER PO) Take 1 tablet by mouth 2 (two) times a week. Takes with align probiotic.    [name] senna (SENOKOT) 8.6 MG TABS tablet Take 1 tablet by mouth daily as needed (for constipation).    . sennosides-docusate sodium (SENOKOT-S) 8.6-50 MG tablet Take 1 tablet by mouth daily. *hold for constipation*    . sertraline (ZOLOFT) 100 MG tablet Take 1 tablet (100 mg total) by mouth daily. (Patient taking differently: Take 100 mg by mouth at bedtime. ) 90 tablet 3  . Simethicone (GAS-X PO) Take 1 tablet by mouth as needed.    [name] VITAMIN E PO Take 1 tablet by mouth as needed.    [name] letrozole (FEMARA) 2.5 MG tablet Take 1 tablet (2.5 mg total) by mouth daily. 30 tablet 2   No current facility-administered medications for this visit.     Review of Systems Review of Systems  Constitutional: Negative.   Respiratory: Negative.   Cardiovascular: Negative.     Blood pressure 140/78, pulse 68, resp. rate 12, height 5' 3"  (1.6 m), weight 127 lb (57.6 kg).  Physical Exam Physical Exam  Constitutional: She is oriented to person, place, and time. She appears well-developed and well-nourished.  Pulmonary/Chest:    Right breast mastectomy sites is clean and healing well.   Neurological: She is alert and oriented to person, place, and time.  Skin: Skin is warm and dry.      Assessment    Doing well status post mastectomy for a T1b, N0 ER/PR positive, HER-2 negative malignancy.    Plan    Opportunity for formal medical oncology consultation reviewed.  Patient reports that she has discontinued her Prempro vaginal cream.  Indication for antiestrogen therapy reviewed in detail.  Will institute a one-month trial and if well-tolerated will send a new prescription to her mail order pharmacy.    Discussed Femara with patient. Patient agree to try. Call office in a month to report how Femara.Patient to take calcium  daily. Return in two months.   HPI, Physical Exam, Assessment and Plan have been scribed under the direction and in the presence of Hervey Ard, MD.  Gaspar Cola, CMA  I have completed the exam and reviewed the above documentation for accuracy and completeness.  I agree with the above.  Haematologist has been used and any errors in dictation or transcription are unintentional.  Hervey Ard, M.D., F.A.C.S.  Robert Bellow 12/19/2016, 11:52 AM

## 2016-12-19 NOTE — Patient Instructions (Signed)
  Discussed Femara with patient. Patient agree to try. Call office in a month to report how Femara.Patient to take calcium daily. Return in two months.

## 2017-01-10 ENCOUNTER — Ambulatory Visit (INDEPENDENT_AMBULATORY_CARE_PROVIDER_SITE_OTHER): Payer: Medicare Other | Admitting: Internal Medicine

## 2017-01-10 ENCOUNTER — Encounter: Payer: Self-pay | Admitting: Internal Medicine

## 2017-01-10 VITALS — BP 142/76 | HR 72 | Ht 63.0 in | Wt 129.8 lb

## 2017-01-10 DIAGNOSIS — R9431 Abnormal electrocardiogram [ECG] [EKG]: Secondary | ICD-10-CM

## 2017-01-10 DIAGNOSIS — R011 Cardiac murmur, unspecified: Secondary | ICD-10-CM | POA: Diagnosis not present

## 2017-01-10 DIAGNOSIS — R0600 Dyspnea, unspecified: Secondary | ICD-10-CM

## 2017-01-10 DIAGNOSIS — R0609 Other forms of dyspnea: Secondary | ICD-10-CM

## 2017-01-10 NOTE — Patient Instructions (Signed)
Medication Instructions:  Your physician recommends that you continue on your current medications as directed. Please refer to the Current Medication list given to you today.   Labwork: NONE  Testing/Procedures: Your physician has requested that you have an echocardiogram. Echocardiography is a painless test that uses sound waves to create images of your heart. It provides your doctor with information about the size and shape of your heart and how well your heart's chambers and valves are working. This procedure takes approximately one hour. There are no restrictions for this procedure.    Follow-Up: Your physician recommends that you schedule a follow-up appointment in: [place]. ([facility] [place]).  If you need a refill on your cardiac medications before your next appointment, please call your pharmacy.

## 2017-01-10 NOTE — Progress Notes (Signed)
New Outpatient Visit Date: 01/10/2017  Primary Care Provider: Jerrol Banana., MD [address] [place],  [address]  Chief Complaint: Abnormal EKG  HPI:  Sandra [name] is a 71 y.o. female who is being seen today for the evaluation of abnormal EKG as a self-referral. She has a history of breast cancer, celiac disease, hypothyroidism, Raynaud's disease, restless leg syndrome, GERD, and anxiety. She underwent preoperative evaluation for right mastectomy this summer. As part of this, an EKG was obtained and found to be abnormal. She was evaluated by Dr. Clayborn Bigness and cleared for surgery without additional testing. Sandra [name] notes that she has had 3 abnormal EKGs dating back to 2012. At that time, she was evaluated by Dr. Ubaldo Glassing and underwent a stress test that was normal by her report. She has felt well with the exception of some exertional dyspnea when going up an incline or climbing 2 flights of stairs. This has gradually worsened over the last 1.5 years. She has not had any chest pain. She also noticed that her heart skips a beat occasionally. She denies lightheadedness, orthopnea, PND, and edema. She does not have any pain in her legs when walking, but has intermittent leg cramps at night.  Sandra [name] tolerated her right mastectomy and lymph node dissection well. She reports that her cancer was stage I, and that she does not require chemotherapy or radiation.  --------------------------------------------------------------------------------------------------  Cardiovascular History & Procedures: Cardiovascular Problems:  Abnormal EKG  Dyspnea on exertion  Risk Factors:  Age greater than 65  Cath/PCI:  None  CV Surgery:  None  EP Procedures and Devices:  None  Non-Invasive Evaluation(s):  Reported stress test in 2012. Results not available but negative by patient report.  Recent CV Pertinent Labs: Lab Results  Component Value Date   CHOL 180 05/31/2016    HDL 43 05/31/2016   LDLCALC 106 (H) 05/31/2016   TRIG 155 (H) 05/31/2016   K 4.3 05/31/2016   BUN 12 05/31/2016   CREATININE 0.60 05/31/2016    --------------------------------------------------------------------------------------------------  Past Medical History:  Diagnosis Date  . Anemia   . Anxiety   . Arthritis   . BRCA gene mutation negative 10/2016   [facility]  . Breast cancer ([place]) 10/16/2016   T1c,N0;, GRADE I/III, 1.6cm. ER/PR pos  HER2 not over expressed  . Celiac disease   . Depression   . GERD (gastroesophageal reflux disease)    OCC  . Headache    H/O MIGRAINES AS TEENAGER  . Heart murmur    PT WAS TOLD ONCE SHE HAD MVP AND THEN ANOTHER MD SAID SHE DID NOT HAVE MVP  . Hypothyroidism   . Pneumonia    YEARS AGO  . Raynaud's disease   . RLS (restless legs syndrome)     Past Surgical History:  Procedure Laterality Date  . ABDOMINAL HYSTERECTOMY    . APPENDECTOMY    . CATARACT EXTRACTION Bilateral   . CESAREAN SECTION    . COLONOSCOPY WITH PROPOFOL N/A 05/19/2015   Procedure: COLONOSCOPY WITH PROPOFOL;  Surgeon: Manya Silvas, MD;  Location: [facility] ENDOSCOPY;  Service: Endoscopy;  Laterality: N/A;  . EYE SURGERY    . HAND SURGERY Left   . SENTINEL NODE BIOPSY Right 11/07/2016   Procedure: SENTINEL NODE BIOPSY;  Surgeon: Robert Bellow, MD;  Location: [facility] ORS;  Service: General;  Laterality: Right;  . SIMPLE MASTECTOMY WITH AXILLARY SENTINEL NODE BIOPSY Right 11/07/2016   Procedure: SIMPLE MASTECTOMY;  Surgeon: Robert Bellow,  MD;  Location: [facility] ORS;  Service: General;  Laterality: Right;  . TONSILLECTOMY  AGE 31  . UPPER GI ENDOSCOPY  01/12/06   hiatus hernia    Current Meds  Medication Sig  . alendronate (FOSAMAX) 70 MG tablet TAKE 1 TABLET BY MOUTH  EVERY WEEK (Patient taking differently: TAKE 1 TABLET BY MOUTH  EVERY WEEK (SATURDAYS) *DO NOT EAT FOR 1 HOUR*)  . ALPRAZolam (XANAX) 0.5 MG tablet Take 1 tablet (0.5 mg total) by mouth at  bedtime as needed for anxiety.  . Calcium Carbonate (CALCIUM 600 PO) Take 600 mg by mouth daily at 3 pm.   . Cholecalciferol (VITAMIN D3) 10000 units TABS Take 1,000 Units by mouth daily at 3 pm.   . hydroxypropyl methylcellulose / hypromellose (ISOPTO TEARS / GONIOVISC) 2.5 % ophthalmic solution Place 1 drop into both eyes 4 (four) times daily as needed for dry eyes (scheduled each morning & then as needed).  [name] letrozole (FEMARA) 2.5 MG tablet Take 1 tablet (2.5 mg total) by mouth daily.  [name] levothyroxine (SYNTHROID, LEVOTHROID) 100 MCG tablet TAKE 1 TABLET BY MOUTH  DAILY (Patient taking differently: TAKE 1 TABLET (100 MCG) BY MOUTH  BEFORE BREAKFAST *30 MINS PRIOR TO BREAKFAST*)  . Multiple Vitamin (MULTIVITAMIN WITH MINERALS) TABS tablet Take 1 tablet by mouth daily at 3 pm.  . Omega-3 Fatty Acids (FISH OIL PO) Take 1 capsule by mouth daily.   [name] omeprazole (PRILOSEC) 20 MG capsule Take 1 capsule (20 mg total) by mouth daily.  . Probiotic Product (ALIGN) 4 MG CAPS Take 4 mg by mouth 2 (two) times a week.  . Psyllium (EQ DAILY FIBER PO) Take 1 tablet by mouth 2 (two) times a week. Takes with align probiotic.  [name] senna (SENOKOT) 8.6 MG TABS tablet Take 1 tablet by mouth daily as needed (for constipation).  . sennosides-docusate sodium (SENOKOT-S) 8.6-50 MG tablet Take 1 tablet by mouth daily. *hold for constipation*  . sertraline (ZOLOFT) 100 MG tablet Take 1 tablet (100 mg total) by mouth daily. (Patient taking differently: Take 100 mg by mouth at bedtime. )  . Simethicone (GAS-X PO) Take 1 tablet by mouth as needed.  [name] VITAMIN E PO Take 1 tablet by mouth as needed.    Allergies: Benadryl [diphenhydramine hcl (sleep)]; Advil [ibuprofen]; and Claritin [loratadine]  Social History   Social History  . Marital status: Married    Spouse name: N/A  . Number of children: N/A  . Years of education: N/A   Occupational History  . Not on file.   Social History Main Topics  . Smoking status:  Never Smoker  . Smokeless tobacco: Never Used  . Alcohol use Yes     Comment: a glass of wine once in a while  . Drug use: No  . Sexual activity: Not Currently    Birth control/ protection: None   Other Topics Concern  . Not on file   Social History Narrative  . No narrative on file    Family History  Problem Relation Age of Onset  . Cancer Mother   . Anemia Mother   . Other Mother        lymphosarcoma  . Alcohol abuse Father   . Prostate cancer Father   . Breast cancer Sister        33; 1/2 sister, shared mother.  . Stroke Maternal Grandmother   . Breast cancer Maternal Grandmother 68  . Heart attack Maternal Grandfather   . CAD Paternal Grandfather  Review of Systems: A 12-system review of systems was performed and was negative except as noted in the HPI.  --------------------------------------------------------------------------------------------------  Physical Exam: BP (!) 142/76 (BP Location: Left Arm, Patient Position: Sitting, Cuff Size: Normal)   Pulse 72   Ht _0  (1.6 m)   Wt 129 lb 12 oz (58.9 kg)   BMI 22.98 kg/m   General:  Well-developed, well-nourished woman, seated comfortably in the exam room. She is accompanied by her husband. HEENT: No conjunctival pallor or scleral icterus. Moist mucous membranes. OP clear. Neck: Supple without lymphadenopathy, thyromegaly, JVD, or HJR. No carotid bruit. Lungs: Normal work of breathing. Clear to auscultation bilaterally without wheezes or crackles. Heart: Regular rate and rhythm with 2/6 holosystolic murmur loudest at the apex. Non-displaced PMI. Abd: Bowel sounds present. Soft, NT/ND without hepatosplenomegaly Ext: No lower extremity edema. Radial, PT, and DP pulses are 2+ bilaterally Skin: Warm and dry without rash. Neuro: CNIII-XII intact. Strength and fine-touch sensation intact in upper and lower extremities bilaterally. Psych: Normal mood and affect.  EKG:  Normal sinus rhythm with borderline left  atrial enlargement and LVH with widespread ST/T changes low-dose likely reflecting abnormal repolarization. No significant change since 11/01/16.  Lab Results  Component Value Date   WBC 5.0 05/31/2016   HGB 13.2 05/31/2016   HCT 38.3 05/31/2016   MCV 87 05/31/2016   PLT 275 05/31/2016    Lab Results  Component Value Date   NA 139 05/31/2016   K 4.3 05/31/2016   CL 100 05/31/2016   CO2 26 05/31/2016   BUN 12 05/31/2016   CREATININE 0.60 05/31/2016   GLUCOSE 98 05/31/2016   ALT 22 05/31/2016    Lab Results  Component Value Date   CHOL 180 05/31/2016   HDL 43 05/31/2016   LDLCALC 106 (H) 05/31/2016   TRIG 155 (H) 05/31/2016    --------------------------------------------------------------------------------------------------  ASSESSMENT AND PLAN: Abnormal EKG and heart murmur EKG demonstrates LVH with ST/T changes most likely due to abnormal repolarization. Sandra [name]'s only symptoms have been gradually worsening exertional dyspnea. Exam is notable for a systolic murmur, which has been documented in the past. In light of these findings and symptoms, we have agreed to obtain a transthoracic echocardiogram. We will also request records pertaining to prior stress test in 2012.  Dyspnea on exertion This could be due to any number of causes. Underlying structural heart disease is a possibility given EKG abnormalities and murmur on exam. As above, we will obtain a transthoracic echocardiogram for further assessment.  Follow-up: Return to clinic in 1 month.  Nelva Bush, MD 01/10/2017 3:22 PM

## 2017-01-12 ENCOUNTER — Telehealth: Payer: Self-pay | Admitting: *Deleted

## 2017-01-12 NOTE — Telephone Encounter (Signed)
-----   Message from Nelva Bush, MD sent at 01/12/2017  8:01 AM EDT ----- Claris Gladden,  Ms. Corprew mentioned that she has a stress test around 2012 with Dr. Ubaldo Glassing. I cannot find any records of this in Care Everywhere. Could you reach out to [facility] to see if they have the results? Thanks for your help.  Gerald Stabs

## 2017-01-12 NOTE — Telephone Encounter (Signed)
Called over to [facility] and they faxed over stress echocardiogram that was done on 12/28/2011 with additional office visit notes. Placed records requested on Dr. Darnelle Bos desk in his "Inbox" for his review.

## 2017-01-17 ENCOUNTER — Ambulatory Visit: Payer: Self-pay | Admitting: General Surgery

## 2017-01-18 ENCOUNTER — Inpatient Hospital Stay: Payer: Self-pay

## 2017-01-18 ENCOUNTER — Other Ambulatory Visit: Payer: Self-pay

## 2017-01-18 ENCOUNTER — Ambulatory Visit: Payer: Medicare Other | Admitting: General Surgery

## 2017-01-18 ENCOUNTER — Encounter: Payer: Self-pay | Admitting: General Surgery

## 2017-01-18 ENCOUNTER — Ambulatory Visit (INDEPENDENT_AMBULATORY_CARE_PROVIDER_SITE_OTHER): Payer: Medicare Other

## 2017-01-18 VITALS — BP 130/72 | HR 70 | Resp 12 | Ht 63.0 in | Wt 130.0 lb

## 2017-01-18 DIAGNOSIS — R0609 Other forms of dyspnea: Secondary | ICD-10-CM | POA: Diagnosis not present

## 2017-01-18 DIAGNOSIS — N6489 Other specified disorders of breast: Secondary | ICD-10-CM

## 2017-01-18 DIAGNOSIS — C50911 Malignant neoplasm of unspecified site of right female breast: Secondary | ICD-10-CM

## 2017-01-18 DIAGNOSIS — R0600 Dyspnea, unspecified: Secondary | ICD-10-CM

## 2017-01-18 DIAGNOSIS — R9431 Abnormal electrocardiogram [ECG] [EKG]: Secondary | ICD-10-CM

## 2017-01-18 DIAGNOSIS — R011 Cardiac murmur, unspecified: Secondary | ICD-10-CM | POA: Diagnosis not present

## 2017-01-18 LAB — ECHOCARDIOGRAM COMPLETE
Height: 63 in
Weight: 2080 oz

## 2017-01-18 NOTE — Progress Notes (Signed)
Patient ID: Sandra [name], female   DOB: [birth year], 71 y.o.   MRN: [id number]  Chief Complaint  Patient presents with  . Follow-up    HPI Sandra [name] is a 71 y.o. female here today because she is having swelling at right mastectomy site. Patient states she noticed this about a week ago. She states the swelling in mainly at night.  She tried a heating pad, has helped some. HPI  Past Medical History:  Diagnosis Date  . Anemia   . Anxiety   . Arthritis   . BRCA gene mutation negative 10/2016   [facility]  . Breast cancer ([place]) 10/16/2016   T1c,N0;, GRADE I/III, 1.6cm. ER/PR pos  HER2 not over expressed  . Celiac disease   . Depression   . GERD (gastroesophageal reflux disease)    OCC  . Headache    H/O MIGRAINES AS TEENAGER  . Heart murmur    PT WAS TOLD ONCE SHE HAD MVP AND THEN ANOTHER MD SAID SHE DID NOT HAVE MVP  . Hypothyroidism   . Pneumonia    YEARS AGO  . Raynaud's disease   . RLS (restless legs syndrome)     Past Surgical History:  Procedure Laterality Date  . ABDOMINAL HYSTERECTOMY    . APPENDECTOMY    . CATARACT EXTRACTION Bilateral   . CESAREAN SECTION    . COLONOSCOPY WITH PROPOFOL N/A 05/19/2015   Procedure: COLONOSCOPY WITH PROPOFOL;  Surgeon: Manya Silvas, MD;  Location: [facility] ENDOSCOPY;  Service: Endoscopy;  Laterality: N/A;  . EYE SURGERY    . HAND SURGERY Left   . SENTINEL NODE BIOPSY Right 11/07/2016   Procedure: SENTINEL NODE BIOPSY;  Surgeon: Robert Bellow, MD;  Location: [facility] ORS;  Service: General;  Laterality: Right;  . SIMPLE MASTECTOMY WITH AXILLARY SENTINEL NODE BIOPSY Right 11/07/2016   Procedure: SIMPLE MASTECTOMY;  Surgeon: Robert Bellow, MD;  Location: [facility] ORS;  Service: General;  Laterality: Right;  . TONSILLECTOMY  AGE 73  . UPPER GI ENDOSCOPY  01/12/06   hiatus hernia    Family History  Problem Relation Age of Onset  . Cancer Mother   . Anemia Mother   . Other Mother        lymphosarcoma  . Alcohol abuse Father    . Prostate cancer Father   . Breast cancer Sister        41; 1/2 sister, shared mother.  . Stroke Maternal Grandmother   . Breast cancer Maternal Grandmother 68  . Heart attack Maternal Grandfather   . CAD Paternal Grandfather     Social History Social History  Substance Use Topics  . Smoking status: Never Smoker  . Smokeless tobacco: Never Used  . Alcohol use No    Allergies  Allergen Reactions  . Benadryl [Diphenhydramine Hcl (Sleep)] Other (See Comments)    Hyperactivity/causes opposite effect  . Advil [Ibuprofen] Rash    CAN TAKE NAPROSYN  . Claritin [Loratadine] Rash    Current Outpatient Prescriptions  Medication Sig Dispense Refill  . alendronate (FOSAMAX) 70 MG tablet TAKE 1 TABLET BY MOUTH  EVERY WEEK (Patient taking differently: TAKE 1 TABLET BY MOUTH  EVERY WEEK (SATURDAYS) *DO NOT EAT FOR 1 HOUR*) 12 tablet 3  . ALPRAZolam (XANAX) 0.5 MG tablet Take 1 tablet (0.5 mg total) by mouth at bedtime as needed for anxiety. 90 tablet 1  . Calcium Carbonate (CALCIUM 600 PO) Take 600 mg by mouth daily at 3 pm.     .  Cholecalciferol (VITAMIN D3) 10000 units TABS Take 1,000 Units by mouth daily at 3 pm.     . hydroxypropyl methylcellulose / hypromellose (ISOPTO TEARS / GONIOVISC) 2.5 % ophthalmic solution Place 1 drop into both eyes 4 (four) times daily as needed for dry eyes (scheduled each morning & then as needed).    [name] letrozole (FEMARA) 2.5 MG tablet Take 1 tablet (2.5 mg total) by mouth daily. 30 tablet 2  . levothyroxine (SYNTHROID, LEVOTHROID) 100 MCG tablet TAKE 1 TABLET BY MOUTH  DAILY (Patient taking differently: TAKE 1 TABLET (100 MCG) BY MOUTH  BEFORE BREAKFAST *30 MINS PRIOR TO BREAKFAST*) 90 tablet 3  . Multiple Vitamin (MULTIVITAMIN WITH MINERALS) TABS tablet Take 1 tablet by mouth daily at 3 pm.    . Omega-3 Fatty Acids (FISH OIL PO) Take 1 capsule by mouth daily.     [name] omeprazole (PRILOSEC) 20 MG capsule Take 1 capsule (20 mg total) by mouth daily. 30 capsule 3   . Probiotic Product (ALIGN) 4 MG CAPS Take 4 mg by mouth 2 (two) times a week.    . Psyllium (EQ DAILY FIBER PO) Take 1 tablet by mouth 2 (two) times a week. Takes with align probiotic.    [name] senna (SENOKOT) 8.6 MG TABS tablet Take 1 tablet by mouth daily as needed (for constipation).    . sennosides-docusate sodium (SENOKOT-S) 8.6-50 MG tablet Take 1 tablet by mouth daily. *hold for constipation*    . sertraline (ZOLOFT) 100 MG tablet Take 1 tablet (100 mg total) by mouth daily. (Patient taking differently: Take 100 mg by mouth at bedtime. ) 90 tablet 3  . Simethicone (GAS-X PO) Take 1 tablet by mouth as needed.    [name] VITAMIN E PO Take 1 tablet by mouth as needed.     No current facility-administered medications for this visit.     Review of Systems Review of Systems  Constitutional: Negative.   Respiratory: Negative.   Cardiovascular: Negative.     Blood pressure 130/72, pulse 70, resp. rate 12, height 5' 3"  (1.6 m), weight 130 lb (59 kg).  Physical Exam Physical Exam  Constitutional: She is oriented to person, place, and time. She appears well-developed and well-nourished.  Cardiovascular: Normal rate, regular rhythm and normal heart sounds.   Pulmonary/Chest: Effort normal and breath sounds normal.    Right mastectomy site is clean and well healed.   Neurological: She is alert and oriented to person, place, and time.  Skin: Skin is warm and dry.  FNA performed today drained  Data Reviewed Ultrasound examination of the right axilla showed that the lymph node previously identified measures 1.16 x 1.72 x 2.28 cm. No increased vascular flow. This area previously measured 2.4 cm.  There was a small fluid collection inferior to this. After ChloraPrep application and 1 mL of 1% plain Xylocaine 15 mL of clear serous fluid was drained without incident.  Assessment    Small seroma post mastectomy. Decreasing size of residual right axillary node with normal cortex and no increased  vascularity.    Plan         Return in two weeks. The patient is aware to call back for any questions or concerns.   HPI, Physical Exam, Assessment and Plan have been scribed under the direction and in the presence of Hervey Ard, MD.  Gaspar Cola, CMA  I have completed the exam and reviewed the above documentation for accuracy and completeness.  I agree with the above.  Haematologist  has been used and any errors in dictation or transcription are unintentional.  Hervey Ard, M.D., F.A.C.S.  Robert Bellow 01/19/2017, 4:45 PM

## 2017-01-18 NOTE — Patient Instructions (Signed)
Return in two weeks.  

## 2017-01-22 ENCOUNTER — Other Ambulatory Visit: Payer: Self-pay | Admitting: *Deleted

## 2017-01-22 MED ORDER — CARVEDILOL 3.125 MG PO TABS: 3.1250 mg | ORAL_TABLET | Freq: Two times a day (BID) | ORAL | 3 refills | Status: DC

## 2017-01-30 DIAGNOSIS — H40003 Preglaucoma, unspecified, bilateral: Secondary | ICD-10-CM | POA: Diagnosis not present

## 2017-01-31 ENCOUNTER — Inpatient Hospital Stay: Payer: Self-pay

## 2017-01-31 ENCOUNTER — Ambulatory Visit (INDEPENDENT_AMBULATORY_CARE_PROVIDER_SITE_OTHER): Payer: Medicare Other | Admitting: General Surgery

## 2017-01-31 ENCOUNTER — Encounter: Payer: Self-pay | Admitting: General Surgery

## 2017-01-31 ENCOUNTER — Other Ambulatory Visit: Payer: Self-pay | Admitting: General Surgery

## 2017-01-31 VITALS — BP 120/74 | HR 74 | Resp 12 | Ht 63.0 in | Wt 132.0 lb

## 2017-01-31 DIAGNOSIS — N6489 Other specified disorders of breast: Secondary | ICD-10-CM

## 2017-01-31 DIAGNOSIS — C50911 Malignant neoplasm of unspecified site of right female breast: Secondary | ICD-10-CM

## 2017-01-31 DIAGNOSIS — R599 Enlarged lymph nodes, unspecified: Secondary | ICD-10-CM

## 2017-01-31 DIAGNOSIS — R591 Generalized enlarged lymph nodes: Secondary | ICD-10-CM

## 2017-01-31 NOTE — Patient Instructions (Signed)
Patient to have a excision right axilla lymph node. The patient is aware to call back for any questions or concerns.

## 2017-01-31 NOTE — Progress Notes (Signed)
Patient ID: Sandra [name], female   DOB: 10/11/1945, 71 y.o.   MRN: [id number]  Chief Complaint  Patient presents with  . Follow-up    HPI Sandra [name] is a 71 y.o. female here today for her follow up right breast seroma. She states she has had pain for for about three weeks at excision site.                                                                    Marland KitchenHPI  Past Medical History:  Diagnosis Date  . Anemia   . Anxiety   . Arthritis   . BRCA gene mutation negative 10/2016   [facility]  . Breast cancer ([place]) 10/16/2016   T1c,N0;, GRADE I/III, 1.6cm. ER/PR pos  HER2 not over expressed  . Celiac disease   . Depression   . GERD (gastroesophageal reflux disease)    OCC  . Headache    H/O MIGRAINES AS TEENAGER  . Heart murmur    PT WAS TOLD ONCE SHE HAD MVP AND THEN ANOTHER MD SAID SHE DID NOT HAVE MVP  . Hypothyroidism   . Pneumonia    YEARS AGO  . Raynaud's disease   . RLS (restless legs syndrome)     Past Surgical History:  Procedure Laterality Date  . ABDOMINAL HYSTERECTOMY    . APPENDECTOMY    . CATARACT EXTRACTION Bilateral   . CESAREAN SECTION    . COLONOSCOPY WITH PROPOFOL N/A 05/19/2015   Procedure: COLONOSCOPY WITH PROPOFOL;  Surgeon: Manya Silvas, MD;  Location: [facility] ENDOSCOPY;  Service: Endoscopy;  Laterality: N/A;  . EYE SURGERY    . HAND SURGERY Left   . SENTINEL NODE BIOPSY Right 11/07/2016   Procedure: SENTINEL NODE BIOPSY;  Surgeon: Robert Bellow, MD;  Location: [facility] ORS;  Service: General;  Laterality: Right;  . SIMPLE MASTECTOMY WITH AXILLARY SENTINEL NODE BIOPSY Right 11/07/2016   Procedure: SIMPLE MASTECTOMY;  Surgeon: Robert Bellow, MD;  Location: [facility] ORS;  Service: General;  Laterality: Right;  . TONSILLECTOMY  AGE 66  . UPPER GI ENDOSCOPY  01/12/06   hiatus hernia    Family History  Problem Relation Age of Onset  . Cancer Mother   . Anemia Mother   . Other Mother        lymphosarcoma  . Alcohol abuse Father   . Prostate  cancer Father   . Breast cancer Sister        64; 1/2 sister, shared mother.  . Stroke Maternal Grandmother   . Breast cancer Maternal Grandmother 68  . Heart attack Maternal Grandfather   . CAD Paternal Grandfather     Social History Social History  Substance Use Topics  . Smoking status: Never Smoker  . Smokeless tobacco: Never Used  . Alcohol use No    Allergies  Allergen Reactions  . Benadryl [Diphenhydramine Hcl (Sleep)] Other (See Comments)    Hyperactivity/causes opposite effect  . Advil [Ibuprofen] Rash    CAN TAKE NAPROSYN  . Claritin [Loratadine] Rash    Current Outpatient Prescriptions  Medication Sig Dispense Refill  . alendronate (FOSAMAX) 70 MG tablet TAKE 1 TABLET BY MOUTH  EVERY WEEK (Patient taking differently: TAKE 1 TABLET BY MOUTH  EVERY WEEK (SATURDAYS) *DO  NOT EAT FOR 1 HOUR*) 12 tablet 3  . ALPRAZolam (XANAX) 0.5 MG tablet Take 1 tablet (0.5 mg total) by mouth at bedtime as needed for anxiety. 90 tablet 1  . Calcium Carbonate (CALCIUM 600 PO) Take 600 mg by mouth daily at 3 pm.     . carvedilol (COREG) 3.125 MG tablet Take 1 tablet (3.125 mg total) by mouth 2 (two) times daily. 180 tablet 3  . Cholecalciferol (VITAMIN D3) 10000 units TABS Take 1,000 Units by mouth daily at 3 pm.     . hydroxypropyl methylcellulose / hypromellose (ISOPTO TEARS / GONIOVISC) 2.5 % ophthalmic solution Place 1 drop into both eyes 4 (four) times daily as needed for dry eyes (scheduled each morning & then as needed).    [name] letrozole (FEMARA) 2.5 MG tablet Take 1 tablet (2.5 mg total) by mouth daily. 30 tablet 2  . levothyroxine (SYNTHROID, LEVOTHROID) 100 MCG tablet TAKE 1 TABLET BY MOUTH  DAILY (Patient taking differently: TAKE 1 TABLET (100 MCG) BY MOUTH  BEFORE BREAKFAST *30 MINS PRIOR TO BREAKFAST*) 90 tablet 3  . Multiple Vitamin (MULTIVITAMIN WITH MINERALS) TABS tablet Take 1 tablet by mouth daily at 3 pm.    . Omega-3 Fatty Acids (FISH OIL PO) Take 1 capsule by mouth daily.      [name] omeprazole (PRILOSEC) 20 MG capsule Take 1 capsule (20 mg total) by mouth daily. 30 capsule 3  . Probiotic Product (ALIGN) 4 MG CAPS Take 4 mg by mouth 2 (two) times a week.    . Psyllium (EQ DAILY FIBER PO) Take 1 tablet by mouth 2 (two) times a week. Takes with align probiotic.    [name] senna (SENOKOT) 8.6 MG TABS tablet Take 1 tablet by mouth daily as needed (for constipation).    . sennosides-docusate sodium (SENOKOT-S) 8.6-50 MG tablet Take 1 tablet by mouth daily. *hold for constipation*    . sertraline (ZOLOFT) 100 MG tablet Take 1 tablet (100 mg total) by mouth daily. (Patient taking differently: Take 100 mg by mouth at bedtime. ) 90 tablet 3  . Simethicone (GAS-X PO) Take 1 tablet by mouth as needed.    [name] VITAMIN E PO Take 1 tablet by mouth as needed.     No current facility-administered medications for this visit.     Review of Systems Review of Systems  Constitutional: Negative.   Respiratory: Negative.   Cardiovascular: Negative.     Blood pressure 120/74, pulse 74, resp. rate 12, height 5' 3"  (1.6 m), weight 132 lb (59.9 kg).  Physical Exam Physical Exam  Constitutional: She is oriented to person, place, and time. She appears well-developed and well-nourished.  Cardiovascular: Normal rate, regular rhythm and normal heart sounds.   Pulmonary/Chest: Effort normal and breath sounds normal.    Neurological: She is alert and oriented to person, place, and time.  Skin: Skin is warm and dry.  Drained 5 ml of fluid right mastectomy site.    Data Reviewed Ultrasound examination shows a small amount of residual fluid in the lateral aspect of the mastectomy site.  Persistent enlarged lymph node at the site of prominent thickening.   Assessment    Persistent lymphadenopathy near 3 months post mastectomy.    Plan    We'll plan for excision of this area as it failed to significantly improve over the last several weeks.    Patient to have a excision right axilla lymph  node. The patient is aware to call back for any questions or concerns.  HPI, Physical Exam, Assessment and Plan have been scribed under the direction and in the presence of Hervey Ard, MD.  Gaspar Cola, CMA  I have completed the exam and reviewed the above documentation for accuracy and completeness.  I agree with the above.  Haematologist has been used and any errors in dictation or transcription are unintentional.  Hervey Ard, M.D., F.A.C.S.  Robert Bellow 01/31/2017, 9:09 PM  Patient's surgery has been scheduled for 02-15-17 at [address] [facility].   Dominga [place], CMA

## 2017-02-06 ENCOUNTER — Encounter
Admission: RE | Admit: 2017-02-06 | Discharge: 2017-02-06 | Disposition: A | Discharge status: Home / Self Care | Payer: Medicare Other | Source: Ambulatory Visit | Attending: General Surgery | Admitting: General Surgery

## 2017-02-06 ENCOUNTER — Encounter: Payer: Self-pay | Admitting: Internal Medicine

## 2017-02-06 DIAGNOSIS — Z01818 Encounter for other preprocedural examination: Secondary | ICD-10-CM | POA: Insufficient documentation

## 2017-02-06 DIAGNOSIS — H409 Unspecified glaucoma: Secondary | ICD-10-CM | POA: Insufficient documentation

## 2017-02-06 DIAGNOSIS — H401111 Primary open-angle glaucoma, right eye, mild stage: Secondary | ICD-10-CM | POA: Diagnosis not present

## 2017-02-06 HISTORY — DX: Other congenital malformations of cardiac chambers and connections: Q20.8

## 2017-02-06 HISTORY — DX: Dyspnea, unspecified: R06.00

## 2017-02-06 NOTE — Patient Instructions (Signed)
Your procedure is scheduled on: 02-15-17 THURSDAY Report to Same Day Surgery 2nd floor medical mall [facility] Entrance-take elevator on left to 2nd floor.  Check in with surgery information desk.) To find out your arrival time please call [phone] between 1PM - 3PM on 02-14-17 [facility]  Remember: Instructions that are not followed completely may result in serious medical risk, up to and including death, or upon the discretion of your surgeon and anesthesiologist your surgery may need to be rescheduled.    _x___ 1. Do not eat food after midnight the night before your procedure. NO GUM CHEWING OR HARD CANDIES.  You may drink clear liquids up to 2 hours before you are scheduled to arrive at the hospital for your procedure.  Do not drink clear liquids within 2 hours of your scheduled arrival to the hospital.  Clear liquids include  --Water or Apple juice without pulp  --Clear carbohydrate beverage such as ClearFast or Gatorade  --Black Coffee or Clear Tea (No milk, no creamers, do not add anything to the coffee or Tea)  Type 1 and type 2 diabetics should only drink water.    __x__ 2. No Alcohol for 24 hours before or after surgery.   __x__3. No Smoking for 24 prior to surgery.   ____  4. Bring all medications with you on the day of surgery if instructed.    __x__ 5. Notify your doctor if there is any change in your medical condition     (cold, fever, infections).     Do not wear jewelry, make-up, hairpins, clips or nail polish.  Do not wear lotions, powders, or perfumes. You may wear deodorant.  Do not shave 48 hours prior to surgery. Men may shave face and neck.  Do not bring valuables to the hospital.    [facility] is not responsible for any belongings or valuables.               Contacts, dentures or bridgework may not be worn into surgery.  Leave your suitcase in the car. After surgery it may be brought to your room.  For patients admitted to the hospital, discharge time  is determined by your treatment team.   Patients discharged the day of surgery will not be allowed to drive home.  You will need someone to drive you home and stay with you the night of your procedure.    Please read over the following fact sheets that you were given:      _x___ [place] WITH A SMALL SIP OF WATER. These include:  1. LEVOTHYROXINE (SYNTHROID)  2. COREG (CARVEDILOL)   3. PRILOSEC (OMEPRAZOLE)  4. TAKE AN EXTRA OMEPRAZOLE ON Wednesday NIGHT BEFORE BED (02-14-17)  5.  6.  ____Fleets enema or Magnesium Citrate as directed.   _x___ Use CHG Soap or sage wipes as directed on instruction sheet   ____ Use inhalers on the day of surgery and bring to hospital day of surgery  ____ Stop Metformin and Janumet 2 days prior to surgery.    ____ Take 1/2 of usual insulin dose the night before surgery and none on the morning surgery.   ____ Follow recommendations from Cardiologist, Pulmonologist or PCP regarding stopping Aspirin, Coumadin, Plavix ,Eliquis, Effient, or Pradaxa, and Pletal.  ____Stop Anti-inflammatories such as Advil, Aleve, Ibuprofen, Motrin, Naproxen, Naprosyn, Goodies powders or aspirin products. OK to take Tylenol    _x___ Stop supplements until after surgery-STOP FISH OIL NOW-MAY RESUME AFTER  SURGERY   ____ Bring C-Pap to the hospital.

## 2017-02-06 NOTE — Pre-Procedure Instructions (Signed)
Sandra [name]  ECHO COMPLETE WO IMAGE ENHANCING AGENT  Order# [id number]  Reading physician: Minna Merritts, MD Ordering physician: Nelva Bush, MD Study date: 01/18/17  Result Notes   Notes recorded by Vanessa Ralphs, RN on 01/24/2017 at 8:25 AM EDT Patient notified and verbalized understanding of recommendations. ------  Notes recorded by Nelva Bush, MD on 01/24/2017 at 7:20 AM EDT I think it is fine for [name] to start doing some physical activities. She should begin with low intensity increase the activity, as tolerated. If she develops chest pain or shortness of breath, she should stop what she is doing and let us know. ------  Notes recorded by Vanessa Ralphs, RN on 01/22/2017 at 8:49 AM EDT Results called to pt. Pt verbalized understanding. Patient wants to make sure it ok for her to do activities such as push mowing the yard. Her daughter and husband do not want her doing those things until she's cleared by the doctor. Routing to Dr End for recommendations of physical activity or limitations. ------  Notes recorded by Nelva Bush, MD on 01/20/2017 at 3:08 PM EDT Please let [name] know that there is mild to moderate thickening of the left ventricle, which likely explains her EKG changes, heart murmur, and some of her shortness of breath. I recommend that we begin carvedilol 3.125 mg BID and have her follow-up as planned next month to reassess her symptoms.      Study Result   Result status: Final result                           *Sandra [name]                        Sandra [name], Sandra [name] [id number]                            [phone]  ------------------------------------------------------------------- Transthoracic Echocardiography  Patient:    Sandra, [name] MR #:       [id number] Study Date: 01/18/2017 Gender:     F Age:        71 Height:     160 cm Weight:     58.9 kg BSA:        1.62  m^2 Pt. Status: Room:   ORDERING     Nelva Bush, MD  REFERRING    Nelva Bush, MD  ATTENDING    Esmond Plants, MD, [place]  SONOGRAPHER  Pilar Jarvis, RVT, RDCS, RDMS  cc:  ------------------------------------------------------------------- LV EF: 60% -   65%  ------------------------------------------------------------------- History:   PMH:   Dyspnea and murmur.  Risk factors:  Lifelong nonsmoker. Dyslipidemia.  ------------------------------------------------------------------- Study Conclusions  - Left ventricle: The cavity size was normal. There was mild   concentric LVH with moderate hypertrophy of the septum, 1.65 cm.   Near cavity obliteration with systole. No significant outflow   tract gradient measured. Systolic function was normal. The   estimated ejection fraction was in the range of 60% to 65%. Wall   motion was normal; there were no regional wall motion   abnormalities. Features are consistent with a pseudonormal left   ventricular filling pattern, with concomitant abnormal relaxation   and increased filling pressure (  grade 2 diastolic dysfunction). - Mitral valve: There was mild regurgitation. - Left atrium: The atrium was mildly dilated. - Right ventricle: Systolic function was normal. - Pulmonary arteries: Systolic pressure was within the normal   range.  ------------------------------------------------------------------- Labs, prior tests, procedures, and surgery: ECG.     Abnormal.  ------------------------------------------------------------------- Study data:  No prior study was available for comparison.  Study status:  Routine.  Procedure:  Transthoracic echocardiography. Image quality was fair. The study was technically difficult, as a result of poor acoustic windows.  Study completion:  There were no complications.          Transthoracic echocardiography.  M-mode, complete 2D, spectral Doppler, and color  Doppler.  Birthdate: Patient birthdate: [birth year].  Age:  Patient is 71 yr old.  Sex: Gender: female.    BMI: 23 kg/m^2.  Blood pressure:     138/78 Patient status:  Outpatient.  Study date:  Study date: 01/18/2017. Study time: 12:08 PM.  -------------------------------------------------------------------  ------------------------------------------------------------------- Left ventricle:  The cavity size was normal. There was mild concentric LVH with moderate hypertrophy of the septum, 1.65 cm. Near cavity obliteration with systole. No significant outflow tract gradient measured. Systolic function was normal. The estimated ejection fraction was in the range of 60% to 65%. Wall motion was normal; there were no regional wall motion abnormalities. Features are consistent with a pseudonormal left ventricular filling pattern, with concomitant abnormal relaxation and increased filling pressure (grade 2 diastolic dysfunction).  ------------------------------------------------------------------- Aortic valve:   Trileaflet; normal thickness leaflets. Mobility was not restricted.  Doppler:  Transvalvular velocity was within the normal range. There was no stenosis. There was no regurgitation. VTI ratio of LVOT to aortic valve: 0.86. Valve area (VTI): 2.19 cm^2. Indexed valve area (VTI): 1.35 cm^2/m^2. Mean velocity ratio of LVOT to aortic valve: 0.88. Valve area (Vmean): 2.23 cm^2. Indexed valve area (Vmean): 1.37 cm^2/m^2.    Mean gradient (S): 4 mm Hg.  ------------------------------------------------------------------- Aorta:  Aortic root: The aortic root was normal in size.  ------------------------------------------------------------------- Mitral valve:   Structurally normal valve.   Mobility was not restricted.  Doppler:  Transvalvular velocity was within the normal range. There was no evidence for stenosis. There was mild regurgitation.    Valve area by pressure half-time:  3.86 cm^2. Indexed valve area by pressure half-time: 2.38 cm^2/m^2.    Peak gradient (D): 2 mm Hg.  ------------------------------------------------------------------- Left atrium:  The atrium was mildly dilated.  ------------------------------------------------------------------- Right ventricle:  The cavity size was normal. Wall thickness was normal. Systolic function was normal.  ------------------------------------------------------------------- Pulmonic valve:    Structurally normal valve.   Cusp separation was normal.  Doppler:  Transvalvular velocity was within the normal range. There was no evidence for stenosis. There was no regurgitation.  ------------------------------------------------------------------- Tricuspid valve:   Structurally normal valve.    Doppler: Transvalvular velocity was within the normal range. There was no regurgitation.  ------------------------------------------------------------------- Pulmonary artery:   The main pulmonary artery was normal-sized. Systolic pressure was within the normal range.  ------------------------------------------------------------------- Right atrium:  The atrium was normal in size.  ------------------------------------------------------------------- Pericardium:  There was no pericardial effusion.  ------------------------------------------------------------------- Systemic veins: Inferior vena cava: The vessel was normal in size.  ------------------------------------------------------------------- Measurements   Left ventricle                           Value          Reference  LV ID, ED, PLAX chordal  43    mm       43 - 52  LV ID, ES, PLAX chordal                  26    mm       23 - 38  LV fx shortening, PLAX chordal           40    %        >=29  LV PW thickness, ED                      11    mm       ----------  IVS/LV PW ratio, ED              (H)     1.36           <=1.3  Stroke  volume, 2D                        72    ml       ----------  Stroke volume/bsa, 2D                    44    ml/m^2   ----------  LV e&', medial                            5.55  cm/s     ----------  LV E/e&', medial                          14.16          ----------    Ventricular septum                       Value          Reference  IVS thickness, ED                        15    mm       ----------    LVOT                                     Value          Reference  LVOT ID, S                               18    mm       ----------  LVOT area                                2.54  cm^2     ----------  LVOT ID                                  18    mm       ----------  LVOT mean velocity, S                    85.3  cm/s     ----------  LVOT VTI, S                              28.4  cm       ----------  Stroke volume (SV), LVOT DP              72.3  ml       ----------  Stroke index (SV/bsa), LVOT DP           44.5  ml/m^2   ----------    Aortic valve                             Value          Reference  Aortic valve mean velocity, S            97.3  cm/s     ----------  Aortic valve VTI, S                      33    cm       ----------  Aortic mean gradient, S                  4     mm Hg    ----------  VTI ratio, LVOT/AV                       0.86           ----------  Aortic valve area, VTI                   2.19  cm^2     ----------  Aortic valve area/bsa, VTI               1.35  cm^2/m^2 ----------  Velocity ratio, mean, LVOT/AV            0.88           ----------  Aortic valve area, mean velocity         2.23  cm^2     ----------  Aortic valve area/bsa, mean              1.37  cm^2/m^2 ----------  velocity    Aorta                                    Value          Reference  Aortic root ID, ED                       29    mm       ----------  Ascending aorta ID, A-P, S               28    mm       ----------    Left atrium                              Value          Reference   LA ID, A-P, ES                           36  mm       ----------  LA ID/bsa, A-P                   (H)     2.22  cm/m^2   <=2.2  LA volume, S                             72.6  ml       ----------  LA volume/bsa, S                         44.7  ml/m^2   ----------  LA volume, ES, 1-p A4C                   67.4  ml       ----------  LA volume/bsa, ES, 1-p A4C               41.5  ml/m^2   ----------  LA volume, ES, 1-p A2C                   69.1  ml       ----------  LA volume/bsa, ES, 1-p A2C               42.6  ml/m^2   ----------    Mitral valve                             Value          Reference  Mitral E-wave peak velocity              78.6  cm/s     ----------  Mitral A-wave peak velocity              48.5  cm/s     ----------  Mitral deceleration time                 158   ms       150 - 230  Mitral pressure half-time                46    ms       ----------  Mitral peak gradient, D                  2     mm Hg    ----------  Mitral E/A ratio, peak                   1.6            ----------  Mitral valve area, PHT, DP               3.86  cm^2     ----------  Mitral valve area/bsa, PHT, DP           2.38  cm^2/m^2 ----------    Tricuspid valve                          Value          Reference  Tricuspid regurg peak velocity           257   cm/s     ----------  Tricuspid peak RV-RA gradient            26    mm  Hg    ----------    Right atrium                             Value          Reference  RA ID, S-I, ES, A4C                      45    mm       34 - 49  RA area, ES, A4C                         11.6  cm^2     8.3 - 19.5  RA volume, ES, A/L                       25.3  ml       ----------  RA volume/bsa, ES, A/L                   15.6  ml/m^2   ----------    Right ventricle                          Value          Reference  TAPSE                                    20.6  mm       ----------  RV s&', lateral, S                        14.1  cm/s     ----------    Pulmonic valve                            Value          Reference  Pulmonic valve peak velocity, S          99    cm/s     ----------  Pulmonic regurg velocity, ED             105   cm/s     ----------  Legend: (L)  and  (H)  mark values outside specified reference range.  ------------------------------------------------------------------- Prepared and Electronically Authenticated by  Esmond Plants, MD, [facility] 2018-08-23T18:14:32  PACS Images   Show images for ECHOCARDIOGRAM COMPLETE  Patient Information   Patient Name Sandra, [name] Sex Female DOB [birth year] SSN [id number]  Reason For Exam  Priority: Routine  Dx: Abnormal EKG [R94.31 (ICD-10-CM)]; DOE (dyspnea on exertion) [R06.09 (ICD-10-CM)]; Murmur [R01.1 (ICD-10-CM)]  Surgical History   Surgical History   No past medical history on file.    Other Surgical History   Procedure Laterality Date Comment Source  ABDOMINAL HYSTERECTOMY    Provider  APPENDECTOMY    Provider  CATARACT EXTRACTION Bilateral   Provider  CESAREAN SECTION    Provider  COLONOSCOPY WITH PROPOFOL N/A 05/19/2015 Procedure: COLONOSCOPY WITH PROPOFOL; Surgeon: Manya Silvas, MD; Location: [facility]; Service: Endoscopy; Laterality: N/A; Provider  EYE SURGERY    Provider  HAND SURGERY Left   Provider  SENTINEL NODE BIOPSY Right 11/07/2016 Procedure: SENTINEL NODE BIOPSY; Surgeon: Robert Bellow, MD; Location: [facility] ORS; Service: General;  Laterality: Right; Provider  SIMPLE MASTECTOMY WITH AXILLARY SENTINEL NODE BIOPSY Right 11/07/2016 Procedure: SIMPLE MASTECTOMY; Surgeon: Robert Bellow, MD; Location: [facility] ORS; Service: General; Laterality: Right; Provider  TONSILLECTOMY  AGE 55  Provider  UPPER GI ENDOSCOPY  01/12/06 hiatus hernia Provider    Patient Data   Height 63 in    BP 130/72 mmHg       Performing Technologist/Nurse   Performing Technologist/Nurse: Pilar Jarvis T                    Implants     No active implants  to display in this view.  Order-Level Documents:   There are no order-level documents.  Encounter-Level Documents - 01/18/2017:   Electronic signature on 01/18/2017 1:01 PM      Signed   Electronically signed by Minna Merritts, MD on 01/18/17 at 54 EDT  Printable Result Report   Result Report   External Result Report   External Result Report

## 2017-02-07 ENCOUNTER — Encounter
Admission: RE | Admit: 2017-02-07 | Discharge: 2017-02-07 | Disposition: A | Discharge status: Home / Self Care | Payer: Medicare Other | Source: Ambulatory Visit | Attending: General Surgery | Admitting: General Surgery

## 2017-02-07 ENCOUNTER — Other Ambulatory Visit: Payer: Self-pay | Admitting: Family Medicine

## 2017-02-07 DIAGNOSIS — Z01818 Encounter for other preprocedural examination: Secondary | ICD-10-CM | POA: Diagnosis not present

## 2017-02-07 DIAGNOSIS — R1310 Dysphagia, unspecified: Secondary | ICD-10-CM

## 2017-02-07 LAB — BASIC METABOLIC PANEL
Anion gap: 8 (ref 5–15)
BUN: 14 mg/dL (ref 6–20)
CO2: 26 mmol/L (ref 22–32)
Calcium: 8.5 mg/dL — ABNORMAL LOW (ref 8.9–10.3)
Chloride: 105 mmol/L (ref 101–111)
Creatinine, Ser: 0.54 mg/dL (ref 0.44–1.00)
GFR calc Af Amer: >60 mL/min (ref >60)
GFR calc non Af Amer: >60 mL/min (ref >60)
Glucose, Bld: 98 mg/dL (ref 65–99)
Potassium: 3.6 mmol/L (ref 3.5–5.1)
Sodium: 139 mmol/L (ref 135–145)

## 2017-02-07 LAB — CBC
HCT: 35.5 % (ref 35.0–47.0)
Hemoglobin: 12.1 g/dL (ref 12.0–16.0)
MCH: 30 pg (ref 26.0–34.0)
MCHC: 34 g/dL (ref 32.0–36.0)
MCV: 88.2 fL (ref 80.0–100.0)
Platelets: 246 10*3/uL (ref 150–440)
RBC: 4.02 MIL/uL (ref 3.80–5.20)
RDW: 13.6 % (ref 11.5–14.5)
WBC: 5.1 10*3/uL (ref 3.6–11.0)

## 2017-02-07 MED ORDER — OMEPRAZOLE 20 MG PO CPDR: 20.0000 mg | DELAYED_RELEASE_CAPSULE | Freq: Every day | ORAL | 3 refills | Status: DC

## 2017-02-07 NOTE — Telephone Encounter (Signed)
[facility] pharmacy faxed a request on the following medication. Thanks CC  omeprazole (PRILOSEC) 20 MG capsule

## 2017-02-12 ENCOUNTER — Telehealth: Payer: Self-pay | Admitting: *Deleted

## 2017-02-12 NOTE — Telephone Encounter (Signed)
Patient called the office wanting to reschedule surgery that was originally scheduled on 02-15-17.   This patient states that she has been keeping sick kids and that her "nerves are shot".   The patient would like to reschedule for 03-05-17.  O.R. notified of date change.

## 2017-02-13 ENCOUNTER — Ambulatory Visit (INDEPENDENT_AMBULATORY_CARE_PROVIDER_SITE_OTHER): Payer: Medicare Other | Admitting: Internal Medicine

## 2017-02-13 ENCOUNTER — Encounter: Payer: Self-pay | Admitting: Internal Medicine

## 2017-02-13 VITALS — BP 140/70 | HR 68 | Ht 63.0 in | Wt 129.8 lb

## 2017-02-13 DIAGNOSIS — R0609 Other forms of dyspnea: Secondary | ICD-10-CM

## 2017-02-13 DIAGNOSIS — R030 Elevated blood-pressure reading, without diagnosis of hypertension: Secondary | ICD-10-CM | POA: Diagnosis not present

## 2017-02-13 DIAGNOSIS — R9431 Abnormal electrocardiogram [ECG] [EKG]: Secondary | ICD-10-CM | POA: Diagnosis not present

## 2017-02-13 DIAGNOSIS — R0600 Dyspnea, unspecified: Secondary | ICD-10-CM

## 2017-02-13 MED ORDER — CARVEDILOL 3.125 MG PO TABS: 3.1250 mg | ORAL_TABLET | Freq: Two times a day (BID) | ORAL | 3 refills | Status: DC

## 2017-02-13 NOTE — Progress Notes (Signed)
Follow-up Outpatient Visit Date: 02/13/2017  Primary Care Provider: Jerrol Banana., MD [address] [place] [address]  Chief Complaint: Dyspnea on exertion  HPI:  Sandra [name] is a 71 y.o. year-old female with history of breast cancer, celiac disease, hypothyroidism, Raynaud's disease, restless leg syndrome, GERD, and anxiety, who presents for follow-up of dyspnea on exertion. I will last saw Sandra [name] about a month ago for evaluation of exertional dyspnea in the setting of an abnormal EKG. We subsequently performed an echocardiogram that showed preserved LV function with mild to moderate LVH and grade 2 diastolic dysfunction. Mild MR was also noted. I recommend starting low-dose carvedilol, which Sandra [name] tolerated well. Unfortunately, she lost her medication bottle about 2 weeks ago.  Today, Sandra [name] feels very stressed. She has been caring for her to great grandchildren while their parents have been out of town. Her husband has also been depressed and expressed some suicidal ideation. This has been very challenging for Sandra [name]. She denies chest pain, palpitations, lightheadedness, and edema. However, she continues to have some exertional dyspnea. She felt as though she were panting after climbing one flight of stairs last week.  --------------------------------------------------------------------------------------------------  Cardiovascular History & Procedures: Cardiovascular Problems:  Abnormal EKG  Dyspnea on exertion  Risk Factors:  Age greater than 65  Cath/PCI:  None  CV Surgery:  None  EP Procedures and Devices:  None  Non-Invasive Evaluation(s):  TTE (01/18/17): Normal LV size with mild LVH and moderate hypertrophy of the septum. No significant outflow tract gradient noted though near complete obliteration of the cavity was noted in systole. LVEF 60-65% with normal wall motion. Grade 2 diastolic dysfunction. Mild mitral  regurgitation. Mild left atrial enlargement. Normal RV size and function. Normal pulmonary artery pressure.  Stress test (approximately 2012): Normal per patient.  Recent CV Pertinent Labs: Lab Results  Component Value Date   CHOL 180 05/31/2016   HDL 43 05/31/2016   LDLCALC 106 (H) 05/31/2016   TRIG 155 (H) 05/31/2016   K 3.6 02/07/2017   BUN 14 02/07/2017   BUN 12 05/31/2016   CREATININE 0.54 02/07/2017    Past medical and surgical history were reviewed and updated in EPIC.  Current Meds  Medication Sig  . acetaminophen (TYLENOL) 500 MG tablet Take 500 mg by mouth every 6 (six) hours as needed for mild pain, moderate pain or headache.  . alendronate (FOSAMAX) 70 MG tablet TAKE 1 TABLET BY MOUTH  EVERY WEEK (Patient taking differently: TAKE 1 TABLET BY MOUTH  EVERY WEEK (SATURDAYS) *DO NOT EAT FOR 1 HOUR*)  . ALPRAZolam (XANAX) 0.5 MG tablet Take 1 tablet (0.5 mg total) by mouth at bedtime as needed for anxiety. (Patient taking differently: Take 0.5 mg by mouth at bedtime. )  . Calcium Carbonate (CALCIUM 600 PO) Take 600 mg by mouth daily at 3 pm.   . Cholecalciferol (VITAMIN D3) 10000 units TABS Take 1,000 Units by mouth daily at 3 pm.   . hydroxypropyl methylcellulose / hypromellose (ISOPTO TEARS / GONIOVISC) 2.5 % ophthalmic solution Place 1 drop into both eyes 4 (four) times daily as needed for dry eyes (scheduled each morning & then as needed).  [name] letrozole (FEMARA) 2.5 MG tablet Take 1 tablet (2.5 mg total) by mouth daily.  [name] levothyroxine (SYNTHROID, LEVOTHROID) 100 MCG tablet TAKE 1 TABLET BY MOUTH  DAILY (Patient taking differently: TAKE 1 TABLET (100 MCG) BY MOUTH  BEFORE BREAKFAST *30 MINS PRIOR TO BREAKFAST*)  .  Multiple Vitamin (MULTIVITAMIN WITH MINERALS) TABS tablet Take 1 tablet by mouth daily at 3 pm.  . Omega-3 Fatty Acids (FISH OIL PO) Take 1 capsule by mouth daily.   [name] omeprazole (PRILOSEC) 20 MG capsule Take 1 capsule (20 mg total) by mouth daily.  . Probiotic  Product (ALIGN) 4 MG CAPS Take 4 mg by mouth at bedtime.   . Psyllium (EQ DAILY FIBER PO) Take 1 tablet by mouth at bedtime. Takes with align probiotic.  [name] senna (SENOKOT) 8.6 MG TABS tablet Take 1 tablet by mouth daily as needed (for constipation).  . sertraline (ZOLOFT) 100 MG tablet Take 1 tablet (100 mg total) by mouth daily. (Patient taking differently: Take 100 mg by mouth at bedtime. )  . Simethicone (GAS-X PO) Take 1 tablet by mouth as needed.    Allergies: Benadryl [diphenhydramine hcl (sleep)]; Advil [ibuprofen]; and Claritin [loratadine]  Social History   Social History  . Marital status: Married    Spouse name: N/A  . Number of children: N/A  . Years of education: N/A   Occupational History  . Not on file.   Social History Main Topics  . Smoking status: Never Smoker  . Smokeless tobacco: Never Used  . Alcohol use No  . Drug use: No  . Sexual activity: Not Currently    Birth control/ protection: None   Other Topics Concern  . Not on file   Social History Narrative  . No narrative on file    Family History  Problem Relation Age of Onset  . Cancer Mother   . Anemia Mother   . Other Mother        lymphosarcoma  . Alcohol abuse Father   . Prostate cancer Father   . Breast cancer Sister        77; 1/2 sister, shared mother.  . Stroke Maternal Grandmother   . Breast cancer Maternal Grandmother 68  . Heart attack Maternal Grandfather   . CAD Paternal Grandfather     Review of Systems: A 12-system review of systems was performed and was negative except as noted in the HPI.  --------------------------------------------------------------------------------------------------  Physical Exam: BP 140/70 (BP Location: Left Arm, Patient Position: Sitting, Cuff Size: Normal)   Pulse 68   Ht 5' 3"  (1.6 m)   Wt 129 lb 12 oz (58.9 kg)   BMI 22.98 kg/m   General:  Slender woman seated in the exam room. She appears somewhat anxious today. HEENT: No conjunctival  pallor or scleral icterus. Moist mucous membranes.  OP clear. Neck: Supple without lymphadenopathy, thyromegaly, JVD, or HJR. Lungs: Normal work of breathing. Clear to auscultation bilaterally without wheezes or crackles. Heart: Regular rate and rhythm without murmurs, rubs, or gallops. Non-displaced PMI. Abd: Bowel sounds present. Soft, NT/ND without hepatosplenomegaly Ext: No lower extremity edema. Radial, PT, and DP pulses are 2+ bilaterally. Skin: Warm and dry without rash.  EKG:  Normal sinus rhythm with LVH and abnormal repolarization. No significant change from prior tracings.  Lab Results  Component Value Date   WBC 5.1 02/07/2017   HGB 12.1 02/07/2017   HCT 35.5 02/07/2017   MCV 88.2 02/07/2017   PLT 246 02/07/2017    Lab Results  Component Value Date   NA 139 02/07/2017   K 3.6 02/07/2017   CL 105 02/07/2017   CO2 26 02/07/2017   BUN 14 02/07/2017   CREATININE 0.54 02/07/2017   GLUCOSE 98 02/07/2017   ALT 22 05/31/2016    Lab Results  Component  Value Date   CHOL 180 05/31/2016   HDL 43 05/31/2016   LDLCALC 106 (H) 05/31/2016   TRIG 155 (H) 05/31/2016   --------------------------------------------------------------------------------------------------  ASSESSMENT AND PLAN: Dyspnea on exertion and abnormal EKG Symptoms most likely due to LVH with diastolic dysfunction and potential component of dynamic intracavitary gradient. [name] was tolerated low dose carvedilol well before losing her medication supply. We have given her a new prescription for carvedilol 3.125 mg twice a day. Though she has not had any angina, her EKG is abnormal with widespread ST segment and T-wave changes. While I suspect this is due to abnormal repolarization from her LVH, we have agreed to obtain an exercise myocardial perfusion stress test for further assessment.  Elevated blood pressure Blood pressure is mildly elevated today. Ms. Babinski believes this is due to the considerable amount  of stress that she has been under recently. We spoke at length regarding stressors in her life, which will hopefully improve. We will also restart carvedilol, as above.  Follow-up: Return to clinic in 3 months.  Nelva Bush, MD 02/13/2017 4:42 PM

## 2017-02-13 NOTE — Patient Instructions (Addendum)
Medication Instructions:  Your physician recommends that you continue on your current medications as directed. Please refer to the Current Medication list given to you today.   Labwork: none  Testing/Procedures: Your physician has requested that you have en exercise stress myoview. For further information please visit [email or web address]. Please follow instruction sheet, as given.  [facility]  Your caregiver has ordered a Stress Test with nuclear imaging. The purpose of this test is to evaluate the blood supply to your heart muscle. This procedure is referred to as a "Non-Invasive Stress Test." This is because other than having an IV started in your vein, nothing is inserted or "invades" your body. Cardiac stress tests are done to find areas of poor blood flow to the heart by determining the extent of coronary artery disease (CAD). Some patients exercise on a treadmill, which naturally increases the blood flow to your heart, while others who are  unable to walk on a treadmill due to physical limitations have a pharmacologic/chemical stress agent called Lexiscan . This medicine will mimic walking on a treadmill by temporarily increasing your coronary blood flow.   Please note: these test may take anywhere between 2-4 hours to complete  PLEASE REPORT TO [facility] AT THE FIRST DESK WILL DIRECT YOU WHERE TO GO  Date of Procedure:_____09/20/18____________  Arrival Time for Procedure:______08:45 am  Instructions regarding medication:   _x_:  Hold betablocker (CARVEDILOL) THE night before procedure and morning of procedure    PLEASE NOTIFY THE OFFICE AT LEAST 24 HOURS IN ADVANCE IF YOU ARE UNABLE TO KEEP YOUR APPOINTMENT.  [phone] AND  PLEASE NOTIFY NUCLEAR MEDICINE AT [facility] AT LEAST 24 HOURS IN ADVANCE IF YOU ARE UNABLE TO KEEP YOUR APPOINTMENT. [phone]  How to prepare for your Myoview test:  1. Do not eat or drink after midnight 2. No  caffeine for 24 hours prior to test 3. No smoking 24 hours prior to test. 4. Your medication may be taken with water.  If your doctor stopped a medication because of this test, do not take that medication. 5. Ladies, please do not wear dresses.  Skirts or pants are appropriate. Please wear a short sleeve shirt. 6. No perfume, cologne or lotion. 7. Wear comfortable walking shoes. No heels!           Follow-Up: Your physician recommends that you schedule a follow-up appointment in: 3 MONTHS WITH DR END.   If you need a refill on your cardiac medications before your next appointment, please call your pharmacy.    Cardiac Nuclear Scan A cardiac nuclear scan is a test that measures blood flow to the heart when a person is resting and when he or she is exercising. The test looks for problems such as:  Not enough blood reaching a portion of the heart.  The heart muscle not working normally.  You may need this test if:  You have heart disease.  You have had abnormal lab results.  You have had heart surgery or angioplasty.  You have chest pain.  You have shortness of breath.  In this test, a radioactive dye (tracer) is injected into your bloodstream. After the tracer has traveled to your heart, an imaging device is used to measure how much of the tracer is absorbed by or distributed to various areas of your heart. This procedure is usually done at a hospital and takes 2-4 hours. Tell a health care provider about:  Any allergies you have.  All medicines  you are taking, including vitamins, herbs, eye drops, creams, and over-the-counter medicines.  Any problems you or family members have had with the use of anesthetic medicines.  Any blood disorders you have.  Any surgeries you have had.  Any medical conditions you have.  Whether you are pregnant or may be pregnant. What are the risks? Generally, this is a safe procedure. However, problems may occur, including:  Serious  chest pain and heart attack. This is only a risk if the stress portion of the test is done.  Rapid heartbeat.  Sensation of warmth in your chest. This usually passes quickly.  What happens before the procedure?  Ask your health care provider about changing or stopping your regular medicines. This is especially important if you are taking diabetes medicines or blood thinners.  Remove your jewelry on the day of the procedure. What happens during the procedure?  An IV tube will be inserted into one of your veins.  Your health care provider will inject a small amount of radioactive tracer through the tube.  You will wait for 20-40 minutes while the tracer travels through your bloodstream.  Your heart activity will be monitored with an electrocardiogram (ECG).  You will lie down on an exam table.  Images of your heart will be taken for about 15-20 minutes.  You may be asked to exercise on a treadmill or stationary bike. While you exercise, your heart's activity will be monitored with an ECG, and your blood pressure will be checked. If you are unable to exercise, you may be given a medicine to increase blood flow to parts of your heart.  When blood flow to your heart has peaked, a tracer will again be injected through the IV tube.  After 20-40 minutes, you will get back on the exam table and have more images taken of your heart.  When the procedure is over, your IV tube will be removed. The procedure may vary among health care providers and hospitals. Depending on the type of tracer used, scans may need to be repeated 3-4 hours later. What happens after the procedure?  Unless your health care provider tells you otherwise, you may return to your normal schedule, including diet, activities, and medicines.  Unless your health care provider tells you otherwise, you may increase your fluid intake. This will help flush the contrast dye from your body. Drink enough fluid to keep your urine  clear or pale yellow.  It is up to you to get your test results. Ask your health care provider, or the department that is doing the test, when your results will be ready. Summary  A cardiac nuclear scan measures the blood flow to the heart when a person is resting and when he or she is exercising.  You may need this test if you are at risk for heart disease.  Tell your health care provider if you are pregnant.  Unless your health care provider tells you otherwise, increase your fluid intake. This will help flush the contrast dye from your body. Drink enough fluid to keep your urine clear or pale yellow. This information is not intended to replace advice given to you by your health care provider. Make sure you discuss any questions you have with your health care provider. Document Released: 06/09/2004 Document Revised: 05/17/2016 Document Reviewed: 04/23/2013 Elsevier Interactive Patient Education  2017 [employer].

## 2017-02-14 ENCOUNTER — Encounter: Payer: Self-pay | Admitting: Internal Medicine

## 2017-02-15 ENCOUNTER — Encounter
Admission: RE | Admit: 2017-02-15 | Discharge: 2017-02-15 | Disposition: A | Discharge status: Home / Self Care | Payer: Medicare Other | Source: Ambulatory Visit | Attending: Internal Medicine | Admitting: Internal Medicine

## 2017-02-15 DIAGNOSIS — R9431 Abnormal electrocardiogram [ECG] [EKG]: Secondary | ICD-10-CM | POA: Insufficient documentation

## 2017-02-15 DIAGNOSIS — R0609 Other forms of dyspnea: Secondary | ICD-10-CM | POA: Diagnosis not present

## 2017-02-15 DIAGNOSIS — R0600 Dyspnea, unspecified: Secondary | ICD-10-CM

## 2017-02-15 LAB — NM MYOCAR MULTI W/SPECT W/WALL MOTION / EF
LV dias vol: 68 mL (ref 46–106)
LV sys vol: 21 mL
Peak HR: 90 {beats}/min
Percent HR: 60 %
Rest HR: 63 {beats}/min
SDS: 1
SRS: 0
SSS: 1
TID: 1.08

## 2017-02-15 MED ORDER — TECHNETIUM TC 99M TETROFOSMIN IV KIT
31.6600 | PACK | Freq: Once | INTRAVENOUS | Status: AC | PRN
  Administered 2017-02-15: 31.66 via INTRAVENOUS

## 2017-02-15 MED ORDER — REGADENOSON 0.4 MG/5ML IV SOLN
0.4000 mg | Freq: Once | INTRAVENOUS | Status: AC
  Administered 2017-02-15: 0.4 mg via INTRAVENOUS

## 2017-02-15 MED ORDER — TECHNETIUM TC 99M TETROFOSMIN IV KIT
13.3800 | PACK | Freq: Once | INTRAVENOUS | Status: AC | PRN
  Administered 2017-02-15: 13.38 via INTRAVENOUS

## 2017-02-16 ENCOUNTER — Telehealth: Payer: Self-pay | Admitting: Internal Medicine

## 2017-02-16 NOTE — Telephone Encounter (Signed)
Called pt. See results note

## 2017-02-16 NOTE — Telephone Encounter (Signed)
Pt would like stress test results.

## 2017-02-19 NOTE — Addendum Note (Signed)
Addended by: Othelia Pulling C on: 02/19/2017 01:00 PM   Modules accepted: Orders

## 2017-02-20 ENCOUNTER — Ambulatory Visit: Payer: Self-pay | Admitting: General Surgery

## 2017-02-25 ENCOUNTER — Encounter: Payer: Self-pay | Admitting: General Surgery

## 2017-03-02 NOTE — Pre-Procedure Instructions (Signed)
Progress Notes Encounter Date: 02/13/2017 Nelva Bush, MD  Cardiology  Expand All Collapse All   [] Hide copied text [] Hover for attribution information   Follow-up Outpatient Visit Date: 02/13/2017  Primary Care Provider: Jerrol Banana., MD [address] [place] [address]  Chief Complaint: Dyspnea on exertion  HPI:  Sandra [name] is a 71 y.o. year-old female with history of breast cancer, celiac disease, hypothyroidism, Raynaud's disease, restless leg syndrome, GERD, and anxiety, who presents for follow-up of dyspnea on exertion. I will last saw Sandra [name] about a month ago for evaluation of exertional dyspnea in the setting of an abnormal EKG. We subsequently performed an echocardiogram that showed preserved LV function with mild to moderate LVH and grade 2 diastolic dysfunction. Mild MR was also noted. I recommend starting low-dose carvedilol, which Sandra [name] tolerated well. Unfortunately, she lost her medication bottle about 2 weeks ago.  Today, Sandra [name] feels very stressed. She has been caring for her to great grandchildren while their parents have been out of town. Her husband has also been depressed and expressed some suicidal ideation. This has been very challenging for Sandra [name]. She denies chest pain, palpitations, lightheadedness, and edema. However, she continues to have some exertional dyspnea. She felt as though she were panting after climbing one flight of stairs last week.  --------------------------------------------------------------------------------------------------  Cardiovascular History & Procedures: Cardiovascular Problems:  Abnormal EKG  Dyspnea on exertion  Risk Factors:  Age greater than 65  Cath/PCI:  None  CV Surgery:  None  EP Procedures and Devices:  None  Non-Invasive Evaluation(s):  TTE (01/18/17): Normal LV size with mild LVH and moderate hypertrophy of the septum. No significant outflow tract  gradient noted though near complete obliteration of the cavity was noted in systole. LVEF 60-65% with normal wall motion. Grade 2 diastolic dysfunction. Mild mitral regurgitation. Mild left atrial enlargement. Normal RV size and function. Normal pulmonary artery pressure.  Stress test (approximately 2012): Normal per patient.  Recent CV Pertinent Labs: Labs(Brief)       Lab Results  Component Value Date   CHOL 180 05/31/2016   HDL 43 05/31/2016   LDLCALC 106 (H) 05/31/2016   TRIG 155 (H) 05/31/2016   K 3.6 02/07/2017   BUN 14 02/07/2017   BUN 12 05/31/2016   CREATININE 0.54 02/07/2017      Past medical and surgical history were reviewed and updated in EPIC.  ActiveMedications      Current Meds  Medication Sig  . acetaminophen (TYLENOL) 500 MG tablet Take 500 mg by mouth every 6 (six) hours as needed for mild pain, moderate pain or headache.  . alendronate (FOSAMAX) 70 MG tablet TAKE 1 TABLET BY MOUTH  EVERY WEEK (Patient taking differently: TAKE 1 TABLET BY MOUTH  EVERY WEEK (SATURDAYS) *DO NOT EAT FOR 1 HOUR*)  . ALPRAZolam (XANAX) 0.5 MG tablet Take 1 tablet (0.5 mg total) by mouth at bedtime as needed for anxiety. (Patient taking differently: Take 0.5 mg by mouth at bedtime. )  . Calcium Carbonate (CALCIUM 600 PO) Take 600 mg by mouth daily at 3 pm.   . Cholecalciferol (VITAMIN D3) 10000 units TABS Take 1,000 Units by mouth daily at 3 pm.   . hydroxypropyl methylcellulose / hypromellose (ISOPTO TEARS / GONIOVISC) 2.5 % ophthalmic solution Place 1 drop into both eyes 4 (four) times daily as needed for dry eyes (scheduled each morning & then as needed).  [name] letrozole (FEMARA) 2.5 MG tablet Take 1 tablet (2.5  mg total) by mouth daily.  [name] levothyroxine (SYNTHROID, LEVOTHROID) 100 MCG tablet TAKE 1 TABLET BY MOUTH  DAILY (Patient taking differently: TAKE 1 TABLET (100 MCG) BY MOUTH  BEFORE BREAKFAST *30 MINS PRIOR TO BREAKFAST*)  . Multiple Vitamin (MULTIVITAMIN WITH  MINERALS) TABS tablet Take 1 tablet by mouth daily at 3 pm.  . Omega-3 Fatty Acids (FISH OIL PO) Take 1 capsule by mouth daily.   [name] omeprazole (PRILOSEC) 20 MG capsule Take 1 capsule (20 mg total) by mouth daily.  . Probiotic Product (ALIGN) 4 MG CAPS Take 4 mg by mouth at bedtime.   . Psyllium (EQ DAILY FIBER PO) Take 1 tablet by mouth at bedtime. Takes with align probiotic.  [name] senna (SENOKOT) 8.6 MG TABS tablet Take 1 tablet by mouth daily as needed (for constipation).  . sertraline (ZOLOFT) 100 MG tablet Take 1 tablet (100 mg total) by mouth daily. (Patient taking differently: Take 100 mg by mouth at bedtime. )  . Simethicone (GAS-X PO) Take 1 tablet by mouth as needed.      Allergies: Benadryl [diphenhydramine hcl (sleep)]; Advil [ibuprofen]; and Claritin [loratadine]  Social History        Social History  . Marital status: Married    Spouse name: N/A  . Number of children: N/A  . Years of education: N/A      Occupational History  . Not on file.        Social History Main Topics  . Smoking status: Never Smoker  . Smokeless tobacco: Never Used  . Alcohol use No  . Drug use: No  . Sexual activity: Not Currently    Birth control/ protection: None       Other Topics Concern  . Not on file      Social History Narrative  . No narrative on file         Family History  Problem Relation Age of Onset  . Cancer Mother   . Anemia Mother   . Other Mother        lymphosarcoma  . Alcohol abuse Father   . Prostate cancer Father   . Breast cancer Sister        1; 1/2 sister, shared mother.  . Stroke Maternal Grandmother   . Breast cancer Maternal Grandmother 68  . Heart attack Maternal Grandfather   . CAD Paternal Grandfather     Review of Systems: A 12-system review of systems was performed and was negative except as noted in the  HPI.  --------------------------------------------------------------------------------------------------  Physical Exam: BP 140/70 (BP Location: Left Arm, Patient Position: Sitting, Cuff Size: Normal)   Pulse 68   Ht 5\' 3"  (1.6 m)   Wt 129 lb 12 oz (58.9 kg)   BMI 22.98 kg/m   General:  Slender woman seated in the exam room. She appears somewhat anxious today. HEENT: No conjunctival pallor or scleral icterus. Moist mucous membranes.  OP clear. Neck: Supple without lymphadenopathy, thyromegaly, JVD, or HJR. Lungs: Normal work of breathing. Clear to auscultation bilaterally without wheezes or crackles. Heart: Regular rate and rhythm without murmurs, rubs, or gallops. Non-displaced PMI. Abd: Bowel sounds present. Soft, NT/ND without hepatosplenomegaly Ext: No lower extremity edema. Radial, PT, and DP pulses are 2+ bilaterally. Skin: Warm and dry without rash.  EKG:  Normal sinus rhythm with LVH and abnormal repolarization. No significant change from prior tracings.  RecentLabs       Lab Results  Component Value Date   WBC 5.1 02/07/2017  HGB 12.1 02/07/2017   HCT 35.5 02/07/2017   MCV 88.2 02/07/2017   PLT 246 02/07/2017      RecentLabs       Lab Results  Component Value Date   NA 139 02/07/2017   K 3.6 02/07/2017   CL 105 02/07/2017   CO2 26 02/07/2017   BUN 14 02/07/2017   CREATININE 0.54 02/07/2017   GLUCOSE 98 02/07/2017   ALT 22 05/31/2016      RecentLabs       Lab Results  Component Value Date   CHOL 180 05/31/2016   HDL 43 05/31/2016   LDLCALC 106 (H) 05/31/2016   TRIG 155 (H) 05/31/2016     --------------------------------------------------------------------------------------------------  ASSESSMENT AND PLAN: Dyspnea on exertion and abnormal EKG Symptoms most likely due to LVH with diastolic dysfunction and potential component of dynamic intracavitary gradient. Sandra [name] was tolerated low dose carvedilol well before  losing her medication supply. We have given her a new prescription for carvedilol 3.125 mg twice a day. Though she has not had any angina, her EKG is abnormal with widespread ST segment and T-wave changes. While I suspect this is due to abnormal repolarization from her LVH, we have agreed to obtain an exercise myocardial perfusion stress test for further assessment.  Elevated blood pressure Blood pressure is mildly elevated today. Sandra [name] believes this is due to the considerable amount of stress that she has been under recently. We spoke at length regarding stressors in her life, which will hopefully improve. We will also restart carvedilol, as above.  Follow-up: Return to clinic in 3 months.  Nelva Bush, MD 02/13/2017 4:42 PM     Electronically signed by Nelva Bush, MD at 02/14/2017 7:21 AM      Office Visit on 02/13/2017        Detailed Report

## 2017-03-02 NOTE — Pre-Procedure Instructions (Signed)
NM Myocar Multi W/Spect W/Wall Motion / EF (Accession [id number]) (Order [id number])  Imaging  Date: 02/15/2017 Department: [facility] NUCLEAR MEDICINE Released By: Sharene Skeans Authorizing: Nelva Bush, MD  Exam Information   Status Exam Begun  Exam Ended   Final [99] 02/15/2017 9:23 AM 02/15/2017 11:42 AM  PACS Images   Show images for NM Myocar Multi W/Spect W/Wall Motion / EF  Study Result    Blood pressure demonstrated a hypertensive response to exercise with elevated BP at baseline.  The study is normal.  This is a low risk study.  The left ventricular ejection fraction is hyperdynamic (>65%).  Non diagnostic ECG due to abnormal baseline.    Scans on Order [id number]   Scan on 02/19/2017 10:56 AM by Default, Provider, MD  Scan on 02/17/2017 10:40 AM by Default, Provider, MD  Result History   NM Myocar Multi W/Spect W/Wall Motion / EF (Order #[id number]) on 02/15/2017 - Order Result History Report  Result Notes   Notes recorded by Georgiana Shore, RN on 02/16/2017 at [place] PM EDT Reviewed results and recommendations w/pt who verbalized understanding.   ------  Notes recorded by Nelva Bush, MD on 02/16/2017 at 11:32 AM EDT Please let Ms. Ault know that her stress test is normal. I recommend that she continue with carvedilol, as we discussed earlier this week.      Order-Level Documents:   Scan on 02/19/2017 10:56 AM by Default, Provider, MD  Scan on 02/17/2017 10:40 AM by Default, Provider, MD      Encounter-Level Documents:   Scan on 02/16/2017 2:58 PM by Default, Provider, MD      Vitals   Height Weight BMI (Calculated)  5\' 3"  (1.6 m) 129 lb 12 oz (58.9 kg) 22.99  Protocol Documents   Imaging Protocol  Nuclear Stress Findings   Isotope administration Rest isotope was administered with an IV injection of 13.37 mCi technetium tetrofosmin. Rest SPECT images were obtained approximately 45 minutes post tracer injection. Stress  isotope was administered with an IV injection of 31.65 mCi technetium tetrofosmin    Nuclear Study Quality Overall image quality is excellent.There is no nuclear artifact present.    Nuclear Measurements Study was gated.    Rest Perfusion Rest perfusion normal.    Stress Perfusion Stress perfusion normal.    Overall Study Impression Myocardial perfusion is normal. The study is normal. This is a low risk study. Overall left ventricular systolic function was normal. LV cavity size is normal. The left ventricular ejection fraction is hyperdynamic (>65%).   From: ACCF/SCAI/STS/AATS/AHA/ASNC/HFSA/SCCT 2012 Appropriate Use Criteria for Coronary Revascularization Focused Update    Nuclear Stress Measurements   LV sys vol 21 mL    TID 1.08     LV dias vol 68 mL    SSS 1     SRS 0     SDS 1          Stress Measurements   Baseline Vitals  Rest HR 63 bpm    Rest BP 154/85 mmHg    Peak Stress Vitals  Peak HR 90 bpm    Peak BP 156/80 mmHg    Exercise Data  Percent HR 60 %       Stress Findings   ECG Baseline ECG exhibits normal sinus rhythm..  At baseline, ECG shows T wave inversions in 2 or more leads (TWI II, III, aVF, V3-V6).    Stress Findings A pharmacological stress test was performed using IV Lexiscan 0.4mg  over  10 seconds performed without concurrent submaximal exercise. Patient was able to achieve a max heart rate of 120 bpm on the treadmill in stage 2. Test was stopped prior to injection given EKG changes and worsening SOB. Test was changed to [place]. She exhibited a hypertensive response with exercise with a peak BP of 227/155 with BP improving to 189/90 at 4 minutes into recovery from treadmill prior to [place] administration.  The patient reported shortness of breath and flushing during the stress test. The patient experienced no angina during the stress test.   The test was stopped because the patient complained of fatigue and shortness of breath. Test was  stopped per protocol.   Heart rate demonstrated a normal response to exercise. Blood pressure demonstrated a hypertensive response to exercise.   The patient's response to exercise was inadequate for diagnosis.    Response to Stress There was no ST segment deviation noted during stress.  T wave inversion was noted during stress in the I, II, III, aVF, V3, V4, V5 and V6 leads. Arrhythmias during stress: none.  Arrhythmias during recovery: none.  There were no significant arrhythmias noted during the test.  ECG was uninterpretable due to resting ST abnormalities.    Wall Scoring   Score Index: 1.000 Percent Normal: 100.0%           The left ventricular wall motion is normal.          External Results Report   Open External Results Report  Imaging   Imaging Information  Signed by   Signed Date/Time  Phone Pager  Kathlyn [place] A 02/15/2017 12:27 PM [phone]   Result Notes   Notes recorded by Georgiana Shore, RN on 02/16/2017 at [place] PM EDT Reviewed results and recommendations w/pt who verbalized understanding.   ------  Notes recorded by Nelva Bush, MD on 02/16/2017 at 11:32 AM EDT Please let Ms. [place] know that her stress test is normal. I recommend that she continue with carvedilol, as we discussed earlier this week.      Signed   Electronically signed by Wellington Hampshire, MD on 02/15/17 at 1227 EDT  Imaging Related Medications   Medication  technetium tetrofosmin (TC-MYOVIEW) injection 53.61 millicurie  Route: Intravenous  Admin Amount: 44.31 millicurie  PRN Reason(s): radiopharmaceutical  Last Admin Time: 02/15/17 0915  Number of Expected Doses: 1  Most Recent Administration:  User Action Time Recorded Time Dose Route Site Comment Action Reason  Kingsley Callander 02/15/17 0915 54/00/86 7619 50.93 millicurie Intravenous   Contrast Given   Full Administration Report            Medication  technetium tetrofosmin (TC-MYOVIEW) injection 26.71  millicurie  Route: Intravenous  Admin Amount: 24.58 millicurie  PRN Reason(s): radiopharmaceutical  Last Admin Time: 02/15/17 1040  Number of Expected Doses: 1  Most Recent Administration:  User Action Time Recorded Time Dose Route Site Comment Action Reason  Kingsley Callander 02/15/17 1040 09/98/33 8250 53.97 millicurie Intravenous   Contrast Given   Full Administration Report            Original Order   Ordered On Ordered By   02/13/2017 5:11 PM Vanessa Ralphs, RN

## 2017-03-04 MED ORDER — CEFAZOLIN SODIUM-DEXTROSE 1-4 GM/50ML-% IV SOLN: 1.0000 g | INTRAVENOUS | Status: DC

## 2017-03-05 ENCOUNTER — Ambulatory Visit: Payer: Medicare Other | Admitting: Anesthesiology

## 2017-03-05 ENCOUNTER — Ambulatory Visit
Admission: RE | Admit: 2017-03-05 | Discharge: 2017-03-05 | Disposition: A | Discharge status: Home / Self Care | Payer: Medicare Other | Source: Ambulatory Visit | Attending: General Surgery | Admitting: General Surgery

## 2017-03-05 ENCOUNTER — Encounter
Admission: RE | Disposition: A | Discharge status: Home / Self Care | Payer: Self-pay | Source: Ambulatory Visit | Attending: General Surgery

## 2017-03-05 DIAGNOSIS — R590 Localized enlarged lymph nodes: Secondary | ICD-10-CM | POA: Diagnosis not present

## 2017-03-05 DIAGNOSIS — E039 Hypothyroidism, unspecified: Secondary | ICD-10-CM | POA: Insufficient documentation

## 2017-03-05 DIAGNOSIS — I7300 Raynaud's syndrome without gangrene: Secondary | ICD-10-CM | POA: Diagnosis not present

## 2017-03-05 DIAGNOSIS — K219 Gastro-esophageal reflux disease without esophagitis: Secondary | ICD-10-CM | POA: Insufficient documentation

## 2017-03-05 DIAGNOSIS — I10 Essential (primary) hypertension: Secondary | ICD-10-CM | POA: Insufficient documentation

## 2017-03-05 DIAGNOSIS — I499 Cardiac arrhythmia, unspecified: Secondary | ICD-10-CM | POA: Diagnosis not present

## 2017-03-05 DIAGNOSIS — G2581 Restless legs syndrome: Secondary | ICD-10-CM | POA: Insufficient documentation

## 2017-03-05 DIAGNOSIS — F419 Anxiety disorder, unspecified: Secondary | ICD-10-CM | POA: Insufficient documentation

## 2017-03-05 DIAGNOSIS — F329 Major depressive disorder, single episode, unspecified: Secondary | ICD-10-CM | POA: Diagnosis not present

## 2017-03-05 DIAGNOSIS — R599 Enlarged lymph nodes, unspecified: Secondary | ICD-10-CM | POA: Diagnosis not present

## 2017-03-05 DIAGNOSIS — D649 Anemia, unspecified: Secondary | ICD-10-CM | POA: Diagnosis not present

## 2017-03-05 DIAGNOSIS — M1990 Unspecified osteoarthritis, unspecified site: Secondary | ICD-10-CM | POA: Insufficient documentation

## 2017-03-05 DIAGNOSIS — K900 Celiac disease: Secondary | ICD-10-CM | POA: Insufficient documentation

## 2017-03-05 DIAGNOSIS — N6489 Other specified disorders of breast: Secondary | ICD-10-CM | POA: Insufficient documentation

## 2017-03-05 DIAGNOSIS — Z853 Personal history of malignant neoplasm of breast: Secondary | ICD-10-CM | POA: Insufficient documentation

## 2017-03-05 DIAGNOSIS — Z79899 Other long term (current) drug therapy: Secondary | ICD-10-CM | POA: Insufficient documentation

## 2017-03-05 DIAGNOSIS — R591 Generalized enlarged lymph nodes: Secondary | ICD-10-CM

## 2017-03-05 DIAGNOSIS — I739 Peripheral vascular disease, unspecified: Secondary | ICD-10-CM | POA: Diagnosis not present

## 2017-03-05 HISTORY — PX: AXILLARY LYMPH NODE BIOPSY: SHX5737

## 2017-03-05 SURGERY — AXILLARY LYMPH NODE BIOPSY
Anesthesia: General | Laterality: Right | Wound class: Clean

## 2017-03-05 MED ORDER — FENTANYL CITRATE (PF) 100 MCG/2ML IJ SOLN
INTRAMUSCULAR | Status: AC
Start: 2017-03-05 — End: 2017-03-05
  Administered 2017-03-05: 25 ug via INTRAVENOUS
  Filled 2017-03-05: qty 2

## 2017-03-05 MED ORDER — FENTANYL CITRATE (PF) 100 MCG/2ML IJ SOLN
INTRAMUSCULAR | Status: DC | PRN
  Administered 2017-03-05 (×2): 50 ug via INTRAVENOUS

## 2017-03-05 MED ORDER — BUPIVACAINE-EPINEPHRINE 0.5% -1:200000 IJ SOLN
INTRAMUSCULAR | Status: DC | PRN
  Administered 2017-03-05: 20 mL

## 2017-03-05 MED ORDER — DEXAMETHASONE SODIUM PHOSPHATE 10 MG/ML IJ SOLN
INTRAMUSCULAR | Status: DC | PRN
  Administered 2017-03-05: 10 mg via INTRAVENOUS

## 2017-03-05 MED ORDER — FENTANYL CITRATE (PF) 100 MCG/2ML IJ SOLN
25.0000 ug | INTRAMUSCULAR | Status: AC | PRN
  Administered 2017-03-05 (×6): 25 ug via INTRAVENOUS

## 2017-03-05 MED ORDER — FENTANYL CITRATE (PF) 100 MCG/2ML IJ SOLN
INTRAMUSCULAR | Status: AC
  Administered 2017-03-05: 25 ug via INTRAVENOUS
  Filled 2017-03-05: qty 2

## 2017-03-05 MED ORDER — EPHEDRINE SULFATE 50 MG/ML IJ SOLN
INTRAMUSCULAR | Status: DC | PRN
  Administered 2017-03-05: 10 mg via INTRAVENOUS

## 2017-03-05 MED ORDER — PROPOFOL 10 MG/ML IV BOLUS
INTRAVENOUS | Status: AC
  Filled 2017-03-05: qty 20

## 2017-03-05 MED ORDER — PROPOFOL 10 MG/ML IV BOLUS
INTRAVENOUS | Status: DC | PRN
Start: 2017-03-05 — End: 2017-03-05
  Administered 2017-03-05: 150 mg via INTRAVENOUS

## 2017-03-05 MED ORDER — ONDANSETRON HCL 4 MG/2ML IJ SOLN: 4.0000 mg | Freq: Once | INTRAMUSCULAR | Status: DC | PRN

## 2017-03-05 MED ORDER — METHYLENE BLUE 0.5 % INJ SOLN
INTRAVENOUS | Status: AC
  Filled 2017-03-05: qty 10

## 2017-03-05 MED ORDER — LACTATED RINGERS IV SOLN
INTRAVENOUS | Status: DC
  Administered 2017-03-05 (×2): via INTRAVENOUS

## 2017-03-05 MED ORDER — LIDOCAINE HCL (CARDIAC) 20 MG/ML IV SOLN
INTRAVENOUS | Status: DC | PRN
  Administered 2017-03-05: 100 mg via INTRAVENOUS

## 2017-03-05 MED ORDER — FENTANYL CITRATE (PF) 100 MCG/2ML IJ SOLN
INTRAMUSCULAR | Status: AC
  Filled 2017-03-05: qty 2

## 2017-03-05 MED ORDER — CEFAZOLIN SODIUM-DEXTROSE 1-4 GM/50ML-% IV SOLN
INTRAVENOUS | Status: AC
  Filled 2017-03-05: qty 50

## 2017-03-05 MED ORDER — HYDROCODONE-ACETAMINOPHEN 5-325 MG PO TABS: 1.0000 | ORAL_TABLET | ORAL | 0 refills | Status: DC | PRN

## 2017-03-05 MED ORDER — BUPIVACAINE-EPINEPHRINE (PF) 0.5% -1:200000 IJ SOLN
INTRAMUSCULAR | Status: AC
  Filled 2017-03-05: qty 30

## 2017-03-05 SURGICAL SUPPLY — 33 items
APPLIER CLIP 11 MED OPEN (CLIP)
BLADE SURG 15 STRL SS SAFETY (BLADE) ×2 IMPLANT
CANISTER SUCT 1200ML W/VALVE (MISCELLANEOUS) ×2 IMPLANT
CHLORAPREP W/TINT 26ML (MISCELLANEOUS) ×2 IMPLANT
CLIP APPLIE 11 MED OPEN (CLIP) IMPLANT
CNTNR SPEC 2.5X3XGRAD LEK (MISCELLANEOUS) ×1
CONT SPEC 4OZ STER OR WHT (MISCELLANEOUS) ×1
CONTAINER SPEC 2.5X3XGRAD LEK (MISCELLANEOUS) ×1 IMPLANT
COVER PROBE FLX POLY STRL (MISCELLANEOUS) ×2 IMPLANT
DRAPE LAPAROTOMY TRNSV 106X77 (MISCELLANEOUS) ×2 IMPLANT
DRSG TEGADERM 4X4.75 (GAUZE/BANDAGES/DRESSINGS) ×2 IMPLANT
DRSG TELFA 4X3 1S NADH ST (GAUZE/BANDAGES/DRESSINGS) ×2 IMPLANT
ELECT REM PT RETURN 9FT ADLT (ELECTROSURGICAL) ×2
ELECTRODE REM PT RTRN 9FT ADLT (ELECTROSURGICAL) ×1 IMPLANT
GLOVE BIO SURGEON STRL SZ7.5 (GLOVE) ×4 IMPLANT
GLOVE INDICATOR 8.0 STRL GRN (GLOVE) ×4 IMPLANT
GOWN STRL REUS W/ TWL LRG LVL3 (GOWN DISPOSABLE) ×2 IMPLANT
GOWN STRL REUS W/TWL LRG LVL3 (GOWN DISPOSABLE) ×2
KIT RM TURNOVER STRD PROC AR (KITS) ×2 IMPLANT
LABEL OR SOLS (LABEL) ×2 IMPLANT
MARGIN MAP 10MM (MISCELLANEOUS) IMPLANT
NEEDLE HYPO 25X1 1.5 SAFETY (NEEDLE) ×2 IMPLANT
NS IRRIG 500ML POUR BTL (IV SOLUTION) ×2 IMPLANT
PACK BASIN MINOR ARMC (MISCELLANEOUS) ×2 IMPLANT
SHEARS FOC LG CVD HARMONIC 17C (MISCELLANEOUS) IMPLANT
STRIP CLOSURE SKIN 1/2X4 (GAUZE/BANDAGES/DRESSINGS) ×2 IMPLANT
SUT VIC AB 2-0 CT1 27 (SUTURE) ×1
SUT VIC AB 2-0 CT1 TAPERPNT 27 (SUTURE) ×1 IMPLANT
SUT VIC AB 3-0 54X BRD REEL (SUTURE) ×1 IMPLANT
SUT VIC AB 3-0 BRD 54 (SUTURE) ×1
SUT VIC AB 4-0 PS2 18 (SUTURE) ×2 IMPLANT
SWABSTK COMLB BENZOIN TINCTURE (MISCELLANEOUS) ×2 IMPLANT
SYR CONTROL 10ML (SYRINGE) ×2 IMPLANT

## 2017-03-05 NOTE — Anesthesia Postprocedure Evaluation (Signed)
Anesthesia Post Note  Patient: Sandra [name]  Procedure(s) Performed: AXILLARY LYMPH NODE BIOPSY (Right )  Patient location during evaluation: PACU Anesthesia Type: General Level of consciousness: awake and alert Pain management: pain level controlled Vital Signs Assessment: post-procedure vital signs reviewed and stable Respiratory status: spontaneous breathing and respiratory function stable Cardiovascular status: stable Anesthetic complications: no     Last Vitals:  Vitals:   03/05/17 1106 03/05/17 1111  BP:  (!) 146/72  Pulse: 64 62  Resp: 13 12  Temp:  36.7 C  SpO2: 96% 96%    Last Pain:  Vitals:   03/05/17 1111  TempSrc:   PainSc: 2                  Jaquane Boughner K

## 2017-03-05 NOTE — Discharge Instructions (Signed)

## 2017-03-05 NOTE — Op Note (Signed)
Preoperative diagnosis: Axillary seroma postmastectomy, suspected lymphadenopathy.  Postoperative diagnosis: Same, inflamed adipose tissue.  Operative procedure excision right axillary adipose tissue  Operative surgeon: Ollen Bowl, M.D.  Anesthesia: Gen. by LMA, Marcaine 0.5% with 1-200,000 epinephrine 20 mL local infiltration  Estimated blood loss: Less than 5 mL.  Clinical note: This 71 year old woman underwent a left simple mastectomy with sentinel node biopsy 4 in June 2018. Nodes were negative. The patient had persistent seroma accumulation especially near the axilla. Ultrasound suggested an enlarged lymph node and that has persisted. She is felt be candidate for operative excision.  Operative note: The patient received Kefzol prior to the procedure. The area was cleansed with ChloraPrep and draped. Ultrasound was used to identify the area of suspicion in the right axilla. Local anesthetic was infiltrated and a transverse incision was made in the mid level of the axilla. The skin was as I sharply determined and dissection completed with electrocautery There was no distinct nodal tissue rather only some thickened areas of adipose tissue. This dissection was carried down to the latissimus muscle  posteriorly and the pectoralis muscle anteriorly.  hemostasis was electrocautery as well as 3-0 Vicryl ties. The deep tissue was approximated after removing a 3 x 2 x 3 cm area of adipose tissue with interrupted 2-0 Vicryl figure-of-eight sutures. A second layer of closure was completed in a similar fashion. The skin was closed with a running 4-0 Vicryl subcuticular suture. Benzoin, Steri-Strips, Telfa and Tegaderm dressing was applied.  The patient tolerated the procedure well and was taken recovery in stable condition.

## 2017-03-05 NOTE — H&P (Signed)
SUSAN BLEICH [id number] Jan 26, 1946     HPI: Persistently enlarged lymph node in the right axilla postmastectomy with recurrent seroma. 4 excision. No change in general health since last visit.  Prescriptions Prior to Admission  Medication Sig Dispense Refill Last Dose  . alendronate (FOSAMAX) 70 MG tablet TAKE 1 TABLET BY MOUTH  EVERY WEEK (Patient taking differently: TAKE 1 TABLET BY MOUTH  EVERY WEEK (SATURDAYS) *DO NOT EAT FOR 1 HOUR*) 12 tablet 3 03/03/2017  . ALPRAZolam (XANAX) 0.5 MG tablet Take 1 tablet (0.5 mg total) by mouth at bedtime as needed for anxiety. (Patient taking differently: Take 0.5 mg by mouth at bedtime. ) 90 tablet 1 03/04/2017 at Unknown time  . Calcium Carbonate (CALCIUM 600 PO) Take 600 mg by mouth daily at 3 pm.    03/04/2017 at Unknown time  . carvedilol (COREG) 3.125 MG tablet Take 1 tablet (3.125 mg total) by mouth 2 (two) times daily. 180 tablet 3 03/05/2017 at 0700  . Cholecalciferol (VITAMIN D3) 10000 units TABS Take 1,000 Units by mouth daily at 3 pm.    03/04/2017 at Unknown time  . hydroxypropyl methylcellulose / hypromellose (ISOPTO TEARS / GONIOVISC) 2.5 % ophthalmic solution Place 1 drop into both eyes 4 (four) times daily as needed for dry eyes (scheduled each morning & then as needed).   03/04/2017 at Unknown time  . letrozole (FEMARA) 2.5 MG tablet Take 1 tablet (2.5 mg total) by mouth daily. 30 tablet 2 03/04/2017 at Unknown time  . levothyroxine (SYNTHROID, LEVOTHROID) 100 MCG tablet TAKE 1 TABLET BY MOUTH  DAILY (Patient taking differently: TAKE 1 TABLET (100 MCG) BY MOUTH  BEFORE BREAKFAST *30 MINS PRIOR TO BREAKFAST*) 90 tablet 3 03/05/2017 at Unknown time  . Multiple Vitamin (MULTIVITAMIN WITH MINERALS) TABS tablet Take 1 tablet by mouth daily at 3 pm.   03/04/2017 at Unknown time  . Omega-3 Fatty Acids (FISH OIL PO) Take 1 capsule by mouth daily.    02/26/2017  . omeprazole (PRILOSEC) 20 MG capsule Take 1 capsule (20 mg total) by mouth daily. 90 capsule 3  03/05/2017 at Unknown time  . Probiotic Product (ALIGN) 4 MG CAPS Take 4 mg by mouth at bedtime.    03/04/2017 at Unknown time  . Psyllium (EQ DAILY FIBER PO) Take 1 tablet by mouth at bedtime. Takes with align probiotic.   03/04/2017 at Unknown time  . sertraline (ZOLOFT) 100 MG tablet Take 1 tablet (100 mg total) by mouth daily. (Patient taking differently: Take 100 mg by mouth at bedtime. ) 90 tablet 3 03/04/2017 at Unknown time  . acetaminophen (TYLENOL) 500 MG tablet Take 500 mg by mouth every 6 (six) hours as needed for mild pain, moderate pain or headache.   Not Taking at Unknown time  . senna (SENOKOT) 8.6 MG TABS tablet Take 1 tablet by mouth daily as needed (for constipation).   Not Taking at Unknown time  . Simethicone (GAS-X PO) Take 1 tablet by mouth as needed.   Not Taking at Unknown time   Allergies  Allergen Reactions  . Benadryl [Diphenhydramine Hcl (Sleep)] Other (See Comments)    Hyperactivity/causes opposite effect  . Advil [Ibuprofen] Rash    CAN TAKE NAPROSYN  . Claritin [Loratadine] Rash   Past Medical History:  Diagnosis Date  . Abnormality of left ventricle of heart    MILD-MODERATE THICKENING PER ECHO ON 01-18-17  . Anemia   . Anxiety   . Arthritis   . BRCA gene mutation negative 10/2016  [facility]  . Breast cancer ([place]) 10/16/2016   T1c,N0;, GRADE I/III, 1.6cm. ER/PR pos  HER2 not over expressed  . Celiac disease   . Depression   . Diastolic dysfunction   . Dyspnea    WITH EXERTION  . GERD (gastroesophageal reflux disease)    OCC  . Headache    H/O MIGRAINES AS TEENAGER  . Heart murmur    PT WAS TOLD ONCE SHE HAD MVP AND THEN ANOTHER MD SAID SHE DID NOT HAVE MVP  . Hypothyroidism   . Pneumonia    YEARS AGO  . Raynaud's disease   . RLS (restless legs syndrome)    Past Surgical History:  Procedure Laterality Date  . ABDOMINAL HYSTERECTOMY    . APPENDECTOMY    . CATARACT EXTRACTION Bilateral   . CESAREAN SECTION    . COLONOSCOPY WITH PROPOFOL N/A  05/19/2015   Procedure: COLONOSCOPY WITH PROPOFOL;  Surgeon: Manya Silvas, MD;  Location[facility] ENDOSCOPY;  Service: Endoscopy;  Laterality: N/A;  . EYE SURGERY    . HAND SURGERY Left   . SENTINEL NODE BIOPSY Right 11/07/2016   Procedure: SENTINEL NODE BIOPSY;  Surgeon: Robert Bellow, MD;  Location: [facility] ORS;  Service: General;  Laterality: Right;  . SIMPLE MASTECTOMY WITH AXILLARY SENTINEL NODE BIOPSY Right 11/07/2016   Procedure: SIMPLE MASTECTOMY;  Surgeon: Robert Bellow, MD;  Location: [facility] ORS;  Service: General;  Laterality: Right;  . TONSILLECTOMY  AGE 36  . UPPER GI ENDOSCOPY  01/12/06   hiatus hernia   Social History   Social History  . Marital status: Married    Spouse name: N/A  . Number of children: N/A  . Years of education: N/A   Occupational History  . Not on file.   Social History Main Topics  . Smoking status: Never Smoker  . Smokeless tobacco: Never Used  . Alcohol use No  . Drug use: No  . Sexual activity: Not Currently    Birth control/ protection: None   Other Topics Concern  . Not on file   Social History Narrative  . No narrative on file   Social History   Social History Narrative  . No narrative on file     ROS: Negative.     PE: HEENT: Negative. Lungs: Clear. Cardio: RR. Robert Bellow 03/05/2017   Assessment/Plan:  Proceed with planned excision right axillary lymph node.

## 2017-03-05 NOTE — Anesthesia Post-op Follow-up Note (Signed)
Anesthesia QCDR form completed.        

## 2017-03-05 NOTE — Transfer of Care (Signed)
Immediate Anesthesia Transfer of Care Note  Patient: Sandra [name]  Procedure(s) Performed: AXILLARY LYMPH NODE BIOPSY (Right )  Patient Location: PACU  Anesthesia Type:General  Level of Consciousness: sedated  Airway & Oxygen Therapy: Patient Spontanous Breathing  Post-op Assessment: Report given to RN and Post -op Vital signs reviewed and stable  Post vital signs: Reviewed and stable  Last Vitals:  Vitals:   03/05/17 0821 03/05/17 1011  BP: (!) 156/76 (!) 145/73  Pulse: 67 65  Resp: 19 12  Temp: 36.5 C (!) 36.3 C  SpO2: 99% 93%    Last Pain:  Vitals:   03/05/17 1011  TempSrc:   PainSc: Asleep         Complications: No apparent anesthesia complications

## 2017-03-05 NOTE — Anesthesia Procedure Notes (Signed)
Procedure Name: LMA Insertion Date/Time: 03/05/2017 9:34 AM Performed by: Justus Memory Pre-anesthesia Checklist: Patient identified, Patient being monitored, Timeout performed, Emergency Drugs available and Suction available Patient Re-evaluated:Patient Re-evaluated prior to induction Oxygen Delivery Method: Circle system utilized Preoxygenation: Pre-oxygenation with 100% oxygen Induction Type: IV induction Ventilation: Mask ventilation without difficulty LMA: LMA inserted LMA Size: 3.5 Tube type: Oral Number of attempts: 1 Placement Confirmation: positive ETCO2 and breath sounds checked- equal and bilateral Tube secured with: Tape Dental Injury: Teeth and Oropharynx as per pre-operative assessment

## 2017-03-05 NOTE — Anesthesia Preprocedure Evaluation (Signed)
Anesthesia Evaluation  Patient identified by MRN, date of birth, ID band Patient awake    Reviewed: Allergy & Precautions, NPO status , Patient's Chart, lab work & pertinent test results  History of Anesthesia Complications Negative for: history of anesthetic complications  Airway Mallampati: II       Dental   Pulmonary neg sleep apnea, neg COPD,           Cardiovascular hypertension, Pt. on medications and Pt. on home beta blockers + Peripheral Vascular Disease  (-) CAD, (-) Past MI and (-) CHF + dysrhythmias (occassional palpitations) + Valvular Problems/Murmurs (murmur, no tx)      Neuro/Psych neg Seizures Anxiety Depression    GI/Hepatic Neg liver ROS, GERD  Medicated and Controlled,  Endo/Other  neg diabetesHypothyroidism   Renal/GU negative Renal ROS     Musculoskeletal   Abdominal   Peds  Hematology   Anesthesia Other Findings   Reproductive/Obstetrics                             Anesthesia Physical Anesthesia Plan  ASA: III  Anesthesia Plan: General   Post-op Pain Management:    Induction: Intravenous  PONV Risk Score and Plan: 3 and Ondansetron, Dexamethasone, Midazolam and Treatment may vary due to age or medical condition  Airway Management Planned: LMA  Additional Equipment:   Intra-op Plan:   Post-operative Plan:   Informed Consent: I have reviewed the patients History and Physical, chart, labs and discussed the procedure including the risks, benefits and alternatives for the proposed anesthesia with the patient or authorized representative who has indicated his/her understanding and acceptance.     Plan Discussed with:   Anesthesia Plan Comments:         Anesthesia Quick Evaluation

## 2017-03-06 ENCOUNTER — Telehealth: Payer: Self-pay | Admitting: General Surgery

## 2017-03-06 LAB — SURGICAL PATHOLOGY

## 2017-03-06 NOTE — Telephone Encounter (Signed)
Patient was notified that the largest lymph node was a little smaller than I anticipated on ultrasound but was benign. She was out to dinner at time of my call. Doing well. Follow-up next week as scheduled.

## 2017-03-12 DIAGNOSIS — H401111 Primary open-angle glaucoma, right eye, mild stage: Secondary | ICD-10-CM | POA: Diagnosis not present

## 2017-03-14 ENCOUNTER — Encounter: Payer: Self-pay | Admitting: General Surgery

## 2017-03-14 ENCOUNTER — Ambulatory Visit (INDEPENDENT_AMBULATORY_CARE_PROVIDER_SITE_OTHER): Payer: Medicare Other | Admitting: General Surgery

## 2017-03-14 VITALS — BP 146/84 | HR 64 | Resp 12 | Ht 63.0 in | Wt 128.0 lb

## 2017-03-14 DIAGNOSIS — C50911 Malignant neoplasm of unspecified site of right female breast: Secondary | ICD-10-CM

## 2017-03-14 DIAGNOSIS — N6489 Other specified disorders of breast: Secondary | ICD-10-CM

## 2017-03-14 NOTE — Patient Instructions (Signed)
The patient is aware to call back for any questions or concerns.  

## 2017-03-14 NOTE — Progress Notes (Signed)
Patient ID: Sandra [name], female   DOB: [birth year], 71 y.o.   MRN: [id number]  Chief Complaint  Patient presents with  . Follow-up    HPI Sandra [name] is a 71 y.o. female.  Follow up right axillary node biopsy and right mastectomy with postoperative seroma. Axillary area is still tender. She is here with her husband, Sandra [name].  HPI  Past Medical History:  Diagnosis Date  . Abnormality of left ventricle of heart    MILD-MODERATE THICKENING PER ECHO ON 01-18-17  . Anemia   . Anxiety   . Arthritis   . BRCA gene mutation negative 10/2016   [facility]  . Breast cancer ([place]) 10/16/2016   T1c,N0;, GRADE I/III, 1.6cm. ER/PR pos  HER2 not over expressed  . Celiac disease   . Depression   . Diastolic dysfunction   . Dyspnea    WITH EXERTION  . GERD (gastroesophageal reflux disease)    OCC  . Headache    H/O MIGRAINES AS TEENAGER  . Heart murmur    PT WAS TOLD ONCE SHE HAD MVP AND THEN ANOTHER MD SAID SHE DID NOT HAVE MVP  . Hypothyroidism   . Pneumonia    YEARS AGO  . Raynaud's disease   . RLS (restless legs syndrome)     Past Surgical History:  Procedure Laterality Date  . ABDOMINAL HYSTERECTOMY    . APPENDECTOMY    . AXILLARY LYMPH NODE BIOPSY Right 03/05/2017   Procedure: AXILLARY LYMPH NODE BIOPSY;  Surgeon: Robert Bellow, MD;  Location: [facility] ORS;  Service: General;  Laterality: Right;  . CATARACT EXTRACTION Bilateral   . CESAREAN SECTION    . COLONOSCOPY WITH PROPOFOL N/A 05/19/2015   Procedure: COLONOSCOPY WITH PROPOFOL;  Surgeon: Manya Silvas, MD;  Location: [facility] END[facility]COPY;  Service: Endoscopy;  Laterality: N/A;  . EYE SURGERY    . HAND SURGERY Left   . SENTINEL NODE BIOPSY Right 11/07/2016   Procedure: SENTINEL NODE BIOPSY;  Surgeon: Robert Bellow, MD;  Location: [facility] ORS;  Service: General;  Laterality: Right;  . SIMPLE MASTECTOMY WITH AXILLARY SENTINEL NODE BIOPSY Right 11/07/2016   Procedure: SIMPLE MASTECTOMY;  Surgeon: Robert Bellow, MD;   Location: [facility] ORS;  Service: General;  Laterality: Right;  . TONSILLECTOMY  AGE 74  . UPPER GI ENDOSCOPY  01/12/06   hiatus hernia    Family History  Problem Relation Age of Onset  . Cancer Mother   . Anemia Mother   . Other Mother        lymphosarcoma  . Alcohol abuse Father   . Prostate cancer Father   . Breast cancer Sister        68; 1/2 sister, shared mother.  . Stroke Maternal Grandmother   . Breast cancer Maternal Grandmother 68  . Heart attack Maternal Grandfather   . CAD Paternal Grandfather     Social History Social History  Substance Use Topics  . Smoking status: Never Smoker  . Smokeless tobacco: Never Used  . Alcohol use No    Allergies  Allergen Reactions  . Benadryl [Diphenhydramine Hcl (Sleep)] Other (See Comments)    Hyperactivity/causes opposite effect  . Advil [Ibuprofen] Rash    CAN TAKE NAPROSYN  . Claritin [Loratadine] Rash    Current Outpatient Prescriptions  Medication Sig Dispense Refill  . acetaminophen (TYLENOL) 500 MG tablet Take 500 mg by mouth every 6 (six) hours as needed for mild pain, moderate pain or headache.    . alendronate (  FOSAMAX) 70 MG tablet TAKE 1 TABLET BY MOUTH  EVERY WEEK (Patient taking differently: TAKE 1 TABLET BY MOUTH  EVERY WEEK (SATURDAYS) *DO NOT EAT FOR 1 HOUR*) 12 tablet 3  . ALPRAZolam (XANAX) 0.5 MG tablet Take 1 tablet (0.5 mg total) by mouth at bedtime as needed for anxiety. (Patient taking differently: Take 0.5 mg by mouth at bedtime. ) 90 tablet 1  . Calcium Carbonate (CALCIUM 600 PO) Take 600 mg by mouth daily at 3 pm.     . carvedilol (COREG) 3.125 MG tablet Take 1 tablet (3.125 mg total) by mouth 2 (two) times daily. 180 tablet 3  . Cholecalciferol (VITAMIN D3) 10000 units TABS Take 1,000 Units by mouth daily at 3 pm.     . hydroxypropyl methylcellulose / hypromellose (ISOPTO TEARS / GONIOVISC) 2.5 % ophthalmic solution Place 1 drop into both eyes 4 (four) times daily as needed for dry eyes (scheduled  each morning & then as needed).    . latanoprost (XALATAN) 0.005 % ophthalmic solution INT 1 GTT IN OD 1 TIME IN THE EVE  5  . letrozole (FEMARA) 2.5 MG tablet Take 1 tablet (2.5 mg total) by mouth daily. 30 tablet 2  . levothyroxine (SYNTHROID, LEVOTHROID) 100 MCG tablet TAKE 1 TABLET BY MOUTH  DAILY (Patient taking differently: TAKE 1 TABLET (100 MCG) BY MOUTH  BEFORE BREAKFAST *30 MINS PRIOR TO BREAKFAST*) 90 tablet 3  . Multiple Vitamin (MULTIVITAMIN WITH MINERALS) TABS tablet Take 1 tablet by mouth daily at 3 pm.    . Omega-3 Fatty Acids (FISH OIL PO) Take 1 capsule by mouth daily.     [name] omeprazole (PRILOSEC) 20 MG capsule Take 1 capsule (20 mg total) by mouth daily. 90 capsule 3  . Probiotic Product (ALIGN) 4 MG CAPS Take 4 mg by mouth at bedtime.     . Psyllium (EQ DAILY FIBER PO) Take 1 tablet by mouth at bedtime. Takes with align probiotic.    [name] senna (SENOKOT) 8.6 MG TABS tablet Take 1 tablet by mouth daily as needed (for constipation).    . sertraline (ZOLOFT) 100 MG tablet Take 1 tablet (100 mg total) by mouth daily. (Patient taking differently: Take 100 mg by mouth at bedtime. ) 90 tablet 3  . Simethicone (GAS-X PO) Take 1 tablet by mouth as needed.     No current facility-administered medications for this visit.     Review of Systems Review of Systems  Constitutional: Negative.   Respiratory: Negative.     Blood pressure (!) 146/84, pulse 64, resp. rate 12, height 5' 3" (1.6 m), weight 128 lb (58.1 kg).  Physical Exam Physical Exam  Constitutional: She is oriented to person, place, and time. She appears well-developed and well-nourished.  Pulmonary/Chest:    Musculoskeletal:  Excellent range of motion  Neurological: She is alert and oriented to person, place, and time.  Skin: Skin is warm and dry.  Psychiatric: Her behavior is normal.    Data Reviewed DIAGNOSIS:  A. AXILLARY FAT; EXCISION:  - SIX LYMPH NODES NEGATIVE FOR MALIGNANCY (0/6).  - FOCAL BIOPSY SITE  CHANGE.    GROSS DESCRIPTION:   A. The specimen is received in a formalin-filled container labeled with  the patient's name and axillary fat.   Core pieces: 2 fragments, aggregate 6.2 x 3.7 x 1.5 cm  Comments: yellow lobulated fibrofatty tissue, sectioned to reveal  multiple lymph node candidates, largest 1.6 x 1.0 x 0.5 cm   Aspiration of the axilla with ultrasound  guidance healed to 20 mL of clear serous fluid. Well tolerated.  Assessment    Seroma post resection of axillary adenopathy.    Plan    We reviewed the pathology. The largest node was a little smaller than what I appreciated on ultrasound, but is certainly negative for evidence of metastatic disease.  The patient will continue on Femara daily.     Follow up in 2 weeks.  HPI, Physical Exam, Assessment and Plan have been scribed under the direction and in the presence of Robert Bellow, MD. Karie Fetch, RN  I have completed the exam and reviewed the above documentation for accuracy and completeness.  I agree with the above.  Haematologist has been used and any errors in dictation or transcription are unintentional.  Hervey Ard, M.D., F.A.C.S.  Robert Bellow 03/14/2017, 9:07 PM

## 2017-03-19 ENCOUNTER — Telehealth: Payer: Self-pay | Admitting: *Deleted

## 2017-03-19 NOTE — Telephone Encounter (Signed)
Patient called stating that she had surgery on 03/05/17, Right Axillary lymph node biopsy and now is experiencing pain in the back of her arm. She wants to know is this normal?

## 2017-03-19 NOTE — Telephone Encounter (Signed)
Spoke with patient and she states that she has been having some pain in the back of her arm and in the right axillary region. This started right after she had fluid drawn off on 03/14/17. She had pain also in the shoulder but this has gotten better. She has been using heat to the area. She will continue to use heat but will try ice to see if this works. She will follow up next week. She will call back if she has any further problems.

## 2017-03-24 ENCOUNTER — Other Ambulatory Visit: Payer: Self-pay | Admitting: Family Medicine

## 2017-03-28 ENCOUNTER — Encounter: Payer: Self-pay | Admitting: General Surgery

## 2017-03-28 ENCOUNTER — Ambulatory Visit (INDEPENDENT_AMBULATORY_CARE_PROVIDER_SITE_OTHER): Payer: Medicare Other | Admitting: General Surgery

## 2017-03-28 VITALS — BP 118/72 | HR 70 | Resp 14 | Ht 64.0 in | Wt 127.0 lb

## 2017-03-28 DIAGNOSIS — N6489 Other specified disorders of breast: Secondary | ICD-10-CM

## 2017-03-28 NOTE — Progress Notes (Signed)
Patient ID: Sandra [name], female   DOB: Mar 30, 1946, 71 y.o.   MRN: [id number]  No chief complaint on file.   HPI Sandra [name] is a 71 y.o. female  Here today for her follow up right axillary node biopsy and right mastectomy with postoperative seroma.She states that she had been having some pain on the inner surface of her upper arm last week, but this is markedly decreased.  Has used heat but sparingly.    HPI  Past Medical History:  Diagnosis Date  . Abnormality of left ventricle of heart    MILD-MODERATE THICKENING PER ECHO ON 01-18-17  . Anemia   . Anxiety   . Arthritis   . BRCA gene mutation negative 10/2016   [facility]  . Breast cancer ([place]) 10/16/2016   T1c,N0;, GRADE I/III, 1.6cm. ER/PR pos  HER2 not over expressed  . Celiac disease   . Depression   . Diastolic dysfunction   . Dyspnea    WITH EXERTION  . GERD (gastroesophageal reflux disease)    OCC  . Headache    H/O MIGRAINES AS TEENAGER  . Heart murmur    PT WAS TOLD ONCE SHE HAD MVP AND THEN ANOTHER MD SAID SHE DID NOT HAVE MVP  . Hypothyroidism   . Pneumonia    YEARS AGO  . Raynaud's disease   . RLS (restless legs syndrome)     Past Surgical History:  Procedure Laterality Date  . ABDOMINAL HYSTERECTOMY    . APPENDECTOMY    . AXILLARY LYMPH NODE BIOPSY Right 03/05/2017   Procedure: AXILLARY LYMPH NODE BIOPSY;  Surgeon: Robert Bellow, MD;  Location: [facility] ORS;  Service: General;  Laterality: Right;  . CATARACT EXTRACTION Bilateral   . CESAREAN SECTION    . COLONOSCOPY WITH PROPOFOL N/A 05/19/2015   Procedure: COLONOSCOPY WITH PROPOFOL;  Surgeon: Manya Silvas, MD;  Location: [facility] ENDOSCOPY;  Service: Endoscopy;  Laterality: N/A;  . EYE SURGERY    . HAND SURGERY Left   . SENTINEL NODE BIOPSY Right 11/07/2016   Procedure: SENTINEL NODE BIOPSY;  Surgeon: Robert Bellow, MD;  Location: [facility] ORS;  Service: General;  Laterality: Right;  . SIMPLE MASTECTOMY WITH AXILLARY SENTINEL NODE BIOPSY Right  11/07/2016   Procedure: SIMPLE MASTECTOMY;  Surgeon: Robert Bellow, MD;  Location: [facility] ORS;  Service: General;  Laterality: Right;  . TONSILLECTOMY  AGE 6  . UPPER GI ENDOSCOPY  01/12/06   hiatus hernia    Family History  Problem Relation Age of Onset  . Cancer Mother   . Anemia Mother   . Other Mother        lymphosarcoma  . Alcohol abuse Father   . Prostate cancer Father   . Breast cancer Sister        6; 1/2 sister, shared mother.  . Stroke Maternal Grandmother   . Breast cancer Maternal Grandmother 68  . Heart attack Maternal Grandfather   . CAD Paternal Grandfather     Social History Social History  Substance Use Topics  . Smoking status: Never Smoker  . Smokeless tobacco: Never Used  . Alcohol use No    Allergies  Allergen Reactions  . Benadryl [Diphenhydramine Hcl (Sleep)] Other (See Comments)    Hyperactivity/causes opposite effect  . Advil [Ibuprofen] Rash    CAN TAKE NAPROSYN  . Claritin [Loratadine] Rash    Current Outpatient Prescriptions  Medication Sig Dispense Refill  . acetaminophen (TYLENOL) 500 MG tablet Take 500 mg by mouth every  6 (six) hours as needed for mild pain, moderate pain or headache.    . alendronate (FOSAMAX) 70 MG tablet TAKE 1 TABLET BY MOUTH  EVERY WEEK 12 tablet 3  . ALPRAZolam (XANAX) 0.5 MG tablet Take 1 tablet (0.5 mg total) by mouth at bedtime as needed for anxiety. (Patient taking differently: Take 0.5 mg by mouth at bedtime. ) 90 tablet 1  . Calcium Carbonate (CALCIUM 600 PO) Take 600 mg by mouth daily at 3 pm.     . carvedilol (COREG) 3.125 MG tablet Take 1 tablet (3.125 mg total) by mouth 2 (two) times daily. 180 tablet 3  . Cholecalciferol (VITAMIN D3) 10000 units TABS Take 1,000 Units by mouth daily at 3 pm.     . hydroxypropyl methylcellulose / hypromellose (ISOPTO TEARS / GONIOVISC) 2.5 % ophthalmic solution Place 1 drop into both eyes 4 (four) times daily as needed for dry eyes (scheduled each morning & then as  needed).    . latanoprost (XALATAN) 0.005 % ophthalmic solution INT 1 GTT IN OD 1 TIME IN THE EVE  5  . letrozole (FEMARA) 2.5 MG tablet Take 1 tablet (2.5 mg total) by mouth daily. 30 tablet 2  . levothyroxine (SYNTHROID, LEVOTHROID) 100 MCG tablet TAKE 1 TABLET BY MOUTH  DAILY (Patient taking differently: TAKE 1 TABLET (100 MCG) BY MOUTH  BEFORE BREAKFAST *30 MINS PRIOR TO BREAKFAST*) 90 tablet 3  . Multiple Vitamin (MULTIVITAMIN WITH MINERALS) TABS tablet Take 1 tablet by mouth daily at 3 pm.    . Omega-3 Fatty Acids (FISH OIL PO) Take 1 capsule by mouth daily.     [name] omeprazole (PRILOSEC) 20 MG capsule Take 1 capsule (20 mg total) by mouth daily. 90 capsule 3  . Probiotic Product (ALIGN) 4 MG CAPS Take 4 mg by mouth at bedtime.     . Psyllium (EQ DAILY FIBER PO) Take 1 tablet by mouth at bedtime. Takes with align probiotic.    [name] senna (SENOKOT) 8.6 MG TABS tablet Take 1 tablet by mouth daily as needed (for constipation).    . sertraline (ZOLOFT) 100 MG tablet Take 1 tablet (100 mg total) by mouth daily. (Patient taking differently: Take 100 mg by mouth at bedtime. ) 90 tablet 3  . Simethicone (GAS-X PO) Take 1 tablet by mouth as needed.     No current facility-administered medications for this visit.     Review of Systems Review of Systems  Constitutional: Negative.   Respiratory: Negative.   Cardiovascular: Negative.     Blood pressure 118/72, pulse 70, resp. rate 14, height 5' 4"  (1.626 m), weight 127 lb (57.6 kg).  Physical Exam Physical Exam  Constitutional: She is oriented to person, place, and time. She appears well-developed and well-nourished.  Pulmonary/Chest:    Neurological: She is alert and oriented to person, place, and time.  Skin: Skin is warm and dry.      Assessment    Steady improvement post axillary exploration.    Plan         Patient to return in one month.  Patient to use heat to the area more regularly. The patient is aware to call back for any  questions or concerns.  HPI, Physical Exam, Assessment and Plan have been scribed under the direction and in the presence of Hervey Ard, MD.  Gaspar Cola, CMA  I have completed the exam and reviewed the above documentation for accuracy and completeness.  I agree with the above.  Haematologist has  been used and any errors in dictation or transcription are unintentional.  Hervey Ard, M.D., F.A.C.S. Robert Bellow 03/28/2017, 11:57 AM

## 2017-03-28 NOTE — Patient Instructions (Signed)
Patient to return in one month.  Patient to use heat. The patient is aware to call back for any questions or concerns.

## 2017-04-05 ENCOUNTER — Ambulatory Visit (INDEPENDENT_AMBULATORY_CARE_PROVIDER_SITE_OTHER): Payer: Medicare Other | Admitting: Family Medicine

## 2017-04-05 VITALS — BP 118/72 | HR 78 | Temp 98.1°F | Resp 16 | Wt 130.0 lb

## 2017-04-05 DIAGNOSIS — E038 Other specified hypothyroidism: Secondary | ICD-10-CM

## 2017-04-05 DIAGNOSIS — K219 Gastro-esophageal reflux disease without esophagitis: Secondary | ICD-10-CM

## 2017-04-05 DIAGNOSIS — R1310 Dysphagia, unspecified: Secondary | ICD-10-CM

## 2017-04-05 DIAGNOSIS — H409 Unspecified glaucoma: Secondary | ICD-10-CM

## 2017-04-05 DIAGNOSIS — F411 Generalized anxiety disorder: Secondary | ICD-10-CM

## 2017-04-05 DIAGNOSIS — M545 Low back pain, unspecified: Secondary | ICD-10-CM

## 2017-04-05 NOTE — Progress Notes (Signed)
Sandra [name]  MRN: [id number] DOB: Jul 23, 1945  Subjective:  HPI   Patient is here for follow up. Last office visit was on 12/04/16.  For dysphagia at that time patient was advised to try daily PPI. B/P was up last time but patient was having pain from mastectomy.  Patient states she also has choking sensation and always had this, one time with endoscopy her esophagus was stretched. She has to eat small portion and take her time. BP Readings from Last 3 Encounters:  04/05/17 118/72  03/28/17 118/72  03/14/17 (!) 146/84   Patient has seen Dr End since last visit. She has had stress test and echo done and has been put on Carvedilol. Patient has been told also by her ophthalmologist that she has glaucoma in her right eye and was started on drops. Lower back pain is present for 2 weeks now. Has taking Advil some for the pain. Patient states she thinks it was from vacuuming for her friend and this pain started after that.  Patient Active Problem List   Diagnosis Date Noted  . Invasive ductal carcinoma of breast, female, right ([place]) 10/20/2016  . Prolapse of vaginal vault after hysterectomy 08/24/2016  . Vaginal atrophy 08/24/2016  . Dyspareunia in female 08/24/2016  . Colon polyps 01/06/2015  . BCC (basal cell carcinoma of skin) 01/06/2015  . Anemia 01/06/2015  . Hypothyroidism 01/06/2015  . Vitamin D deficiency 01/06/2015  . Hyperlipidemia 01/06/2015  . Depression, major 01/06/2015  . GAD (generalized anxiety disorder) 01/06/2015  . RLS (restless legs syndrome) 01/06/2015  . Cataract 01/06/2015  . Raynaud phenomenon 01/06/2015  . GERD (gastroesophageal reflux disease) 01/06/2015  . Celiac disease 01/06/2015  . OA (osteoarthritis) 01/06/2015  . Osteoporosis 01/06/2015  . Pre-diabetes 01/06/2015  . Avitaminosis D 01/06/2015  . Raynaud's syndrome without gangrene 01/06/2015  . Age-related osteoporosis without current pathological fracture 01/06/2015  . Anxiety disorder  01/05/2015    Past Medical History:  Diagnosis Date  . Abnormality of left ventricle of heart    MILD-MODERATE THICKENING PER ECHO ON 01-18-17  . Anemia   . Anxiety   . Arthritis   . BRCA gene mutation negative 10/2016   [facility]  . Breast cancer ([place]) 10/16/2016   T1c,N0;, GRADE I/III, 1.6cm. ER/PR pos  HER2 not over expressed  . Celiac disease   . Depression   . Diastolic dysfunction   . Dyspnea    WITH EXERTION  . GERD (gastroesophageal reflux disease)    OCC  . Headache    H/O MIGRAINES AS TEENAGER  . Heart murmur    PT WAS TOLD ONCE SHE HAD MVP AND THEN ANOTHER MD SAID SHE DID NOT HAVE MVP  . Hypothyroidism   . Pneumonia    YEARS AGO  . Raynaud's disease   . RLS (restless legs syndrome)     Social History   Socioeconomic History  . Marital status: Married    Spouse name: Not on file  . Number of children: Not on file  . Years of education: Not on file  . Highest education level: Not on file  Social Needs  . Financial resource strain: Not on file  . Food insecurity - worry: Not on file  . Food insecurity - inability: Not on file  . Transportation needs - medical: Not on file  . Transportation needs - non-medical: Not on file  Occupational History  . Not on file  Tobacco Use  . Smoking status: Never Smoker  . Smokeless tobacco:  Never Used  Substance and Sexual Activity  . Alcohol use: No  . Drug use: No  . Sexual activity: Not Currently    Birth control/protection: None  Other Topics Concern  . Not on file  Social History Narrative  . Not on file    Outpatient Encounter Medications as of 04/05/2017  Medication Sig  . acetaminophen (TYLENOL) 500 MG tablet Take 500 mg by mouth every 6 (six) hours as needed for mild pain, moderate pain or headache.  . alendronate (FOSAMAX) 70 MG tablet TAKE 1 TABLET BY MOUTH  EVERY WEEK  . ALPRAZolam (XANAX) 0.5 MG tablet Take 1 tablet (0.5 mg total) by mouth at bedtime as needed for anxiety. (Patient taking differently:  Take 0.5 mg by mouth at bedtime. )  . Calcium Carbonate (CALCIUM 600 PO) Take 600 mg by mouth daily at 3 pm.   . carvedilol (COREG) 3.125 MG tablet Take 1 tablet (3.125 mg total) by mouth 2 (two) times daily.  . Cholecalciferol (VITAMIN D3) 10000 units TABS Take 1,000 Units by mouth daily at 3 pm.   . hydroxypropyl methylcellulose / hypromellose (ISOPTO TEARS / GONIOVISC) 2.5 % ophthalmic solution Place 1 drop into both eyes 4 (four) times daily as needed for dry eyes (scheduled each morning & then as needed).  . latanoprost (XALATAN) 0.005 % ophthalmic solution INT 1 GTT IN OD 1 TIME IN THE EVE  . letrozole (FEMARA) 2.5 MG tablet Take 1 tablet (2.5 mg total) by mouth daily.  [name] levothyroxine (SYNTHROID, LEVOTHROID) 100 MCG tablet TAKE 1 TABLET BY MOUTH  DAILY (Patient taking differently: TAKE 1 TABLET (100 MCG) BY MOUTH  BEFORE BREAKFAST *30 MINS PRIOR TO BREAKFAST*)  . Multiple Vitamin (MULTIVITAMIN WITH MINERALS) TABS tablet Take 1 tablet by mouth daily at 3 pm.  . Omega-3 Fatty Acids (FISH OIL PO) Take 1 capsule by mouth daily.   [name] omeprazole (PRILOSEC) 20 MG capsule Take 1 capsule (20 mg total) by mouth daily.  . Probiotic Product (ALIGN) 4 MG CAPS Take 4 mg by mouth at bedtime.   . Psyllium (EQ DAILY FIBER PO) Take 1 tablet by mouth at bedtime. Takes with align probiotic.  [name] senna (SENOKOT) 8.6 MG TABS tablet Take 1 tablet by mouth daily as needed (for constipation).  . sertraline (ZOLOFT) 100 MG tablet Take 1 tablet (100 mg total) by mouth daily. (Patient taking differently: Take 100 mg by mouth at bedtime. )  . Simethicone (GAS-X PO) Take 1 tablet by mouth as needed.   No facility-administered encounter medications on file as of 04/05/2017.     Allergies  Allergen Reactions  . Benadryl [Diphenhydramine Hcl (Sleep)] Other (See Comments)    Hyperactivity/causes opposite effect  . Advil [Ibuprofen] Rash    CAN TAKE NAPROSYN  . Claritin [Loratadine] Rash    Review of Systems    Constitutional: Negative.   Eyes: Negative.   Respiratory: Negative.   Cardiovascular: Negative.   Gastrointestinal: Positive for constipation. Negative for abdominal pain, heartburn, nausea and vomiting.       Choking sensation  Musculoskeletal: Positive for back pain, joint pain and neck pain.  Skin: Negative.   Endo/Heme/Allergies: Negative.   Psychiatric/Behavioral: Negative.     Objective:  BP 118/72   Pulse 78   Temp 98.1 F (36.7 C)   Resp 16   Wt 130 lb (59 kg)   BMI 22.31 kg/m   Physical Exam  Constitutional: She is oriented to person, place, and time and well-developed, well-nourished, and in  no distress.  HENT:  Head: Normocephalic and atraumatic.  Eyes: Conjunctivae are normal. Pupils are equal, round, and reactive to light.  Neck: Normal range of motion. Neck supple.  Cardiovascular: Normal rate, regular rhythm, normal heart sounds and intact distal pulses. Exam reveals no gallop.  No murmur heard. Pulmonary/Chest: Effort normal and breath sounds normal. No respiratory distress. She has no wheezes.  Abdominal: Soft.  Musculoskeletal:  Decreased movement of lumbar spine with flexion.  Neurological: She is alert and oriented to person, place, and time.  Skin: Skin is warm and dry.  Psychiatric: Mood, memory, affect and judgment normal.   Assessment and Plan :  1. GAD (generalized anxiety disorder) Stable.  2. Other specified hypothyroidism 3. Gastroesophageal reflux disease, esophagitis presence not specified  4. Dysphagia, unspecified type Discussed in details. Patient states this is chronic issue. Advised patient may need to consider going back to see GI. Follow for now.Pt declines GI referral presently.  5. Glaucoma of right eye, unspecified glaucoma type Following ophthalmologist.  6. Acute bilateral low back pain without sciatica Discussed with patient trying to stretch daily, offered Prednisone treatment but I think just at home stretching and  following at this time will be appropriate. 7.Breast Cancer  HPI, Exam and A&P transcribed by Tiffany Kocher, RMA under direction and in the presence of [name], MD. I have done the exam and reviewed the chart and it is accurate to the best of my knowledge. Development worker, community has been used and  any errors in dictation or transcription are unintentional. [name] M.D. [facility]

## 2017-04-18 ENCOUNTER — Ambulatory Visit (INDEPENDENT_AMBULATORY_CARE_PROVIDER_SITE_OTHER): Payer: Medicare Other

## 2017-04-18 VITALS — BP 144/64 | HR 60 | Temp 98.0°F | Ht 64.0 in | Wt 130.2 lb

## 2017-04-18 DIAGNOSIS — Z0000 Encounter for general adult medical examination without abnormal findings: Secondary | ICD-10-CM

## 2017-04-18 NOTE — Progress Notes (Signed)
Subjective:   Sandra [name] is a 71 y.o. female who presents for Medicare Annual (Subsequent) preventive examination.  Review of Systems:  N/A  Cardiac Risk Factors include: advanced age (>71mn, >>28women);dyslipidemia     Objective:     Vitals: BP (!) 144/64 (BP Location: Left Arm)   Pulse 60   Temp 98 F (36.7 C) (Oral)   Ht '5\' 4"'  (1.626 m)   Wt 130 lb 3.2 oz (59.1 kg)   BMI 22.35 kg/m   Body mass index is 22.35 kg/m.   Tobacco Social History   Tobacco Use  Smoking Status Never Smoker  Smokeless Tobacco Never Used     Counseling given: Not Answered   Past Medical History:  Diagnosis Date  . Abnormality of left ventricle of heart    MILD-MODERATE THICKENING PER ECHO ON 01-18-17  . Anemia   . Anxiety   . Arthritis   . BRCA gene mutation negative 10/2016   [facility]  . Breast cancer ([place] 10/16/2016   T1c,N0;, GRADE I/III, 1.6cm. ER/PR pos  HER2 not over expressed  . Celiac disease   . Depression   . Diastolic dysfunction   . Dyspnea    WITH EXERTION  . GERD (gastroesophageal reflux disease)    OCC  . Glaucoma    right eye  . Headache    H/O MIGRAINES AS TEENAGER  . Heart murmur    PT WAS TOLD ONCE SHE HAD MVP AND THEN ANOTHER MD SAID SHE DID NOT HAVE MVP  . Hypothyroidism   . Pneumonia    YEARS AGO  . Raynaud's disease   . RLS (restless legs syndrome)    Past Surgical History:  Procedure Laterality Date  . ABDOMINAL HYSTERECTOMY    . APPENDECTOMY    . AXILLARY LYMPH NODE BIOPSY Right 03/05/2017   Procedure: AXILLARY LYMPH NODE BIOPSY;  Surgeon: BRobert Bellow MD;  Location: [facility] ORS;  Service: General;  Laterality: Right;  . CATARACT EXTRACTION Bilateral   . CESAREAN SECTION    . COLONOSCOPY WITH PROPOFOL N/A 05/19/2015   Procedure: COLONOSCOPY WITH PROPOFOL;  Surgeon: RManya Silvas MD;  [facility];  Service: Endoscopy;  Laterality: N/A;  . EYE SURGERY    . HAND SURGERY Left   . SENTINEL NODE BIOPSY Right 11/07/2016   Procedure: SENTINEL NODE BIOPSY;  Surgeon: BRobert Bellow MD;  Location: [facility] ORS;  Service: General;  Laterality: Right;  . SIMPLE MASTECTOMY WITH AXILLARY SENTINEL NODE BIOPSY Right 11/07/2016   Procedure: SIMPLE MASTECTOMY;  Surgeon: BRobert Bellow MD;  Location: [facility] ORS;  Service: General;  Laterality: Right;  . TONSILLECTOMY  AGE 60  . UPPER GI ENDOSCOPY  01/12/06   hiatus hernia   Family History  Problem Relation Age of Onset  . Cancer Mother   . Anemia Mother   . Other Mother        lymphosarcoma  . Alcohol abuse Father   . Prostate cancer Father   . Breast cancer Sister        754 1/2 sister, shared mother.  . Stroke Maternal Grandmother   . Breast cancer Maternal Grandmother 68  . Heart attack Maternal Grandfather   . CAD Paternal Grandfather    Social History   Substance and Sexual Activity  Sexual Activity Not Currently  . Birth control/protection: None    Outpatient Encounter Medications as of 04/18/2017  Medication Sig  . acetaminophen (TYLENOL) 500 MG tablet Take 500 mg by mouth every 6 (  six) hours as needed for mild pain, moderate pain or headache.  . alendronate (FOSAMAX) 70 MG tablet TAKE 1 TABLET BY MOUTH  EVERY WEEK  . ALPRAZolam (XANAX) 0.5 MG tablet Take 1 tablet (0.5 mg total) by mouth at bedtime as needed for anxiety. (Patient taking differently: Take 0.5 mg by mouth at bedtime. )  . Calcium Carbonate (CALCIUM 600 PO) Take 600 mg by mouth daily at 3 pm.   . carvedilol (COREG) 3.125 MG tablet Take 1 tablet (3.125 mg total) by mouth 2 (two) times daily.  . Cholecalciferol (VITAMIN D3) 10000 units TABS Take 1,000 Units by mouth daily at 3 pm.   . hydroxypropyl methylcellulose / hypromellose (ISOPTO TEARS / GONIOVISC) 2.5 % ophthalmic solution Place 1 drop into both eyes 4 (four) times daily as needed for dry eyes (scheduled each morning & then as needed).  . latanoprost (XALATAN) 0.005 % ophthalmic solution INT 1 GTT IN OD 1 TIME IN THE EVE  .  letrozole (FEMARA) 2.5 MG tablet Take 1 tablet (2.5 mg total) by mouth daily.  [name] levothyroxine (SYNTHROID, LEVOTHROID) 100 MCG tablet TAKE 1 TABLET BY MOUTH  DAILY (Patient taking differently: TAKE 1 TABLET (100 MCG) BY MOUTH  BEFORE BREAKFAST *30 MINS PRIOR TO BREAKFAST*)  . Multiple Vitamin (MULTIVITAMIN WITH MINERALS) TABS tablet Take 1 tablet by mouth daily at 3 pm.  . Omega-3 Fatty Acids (FISH OIL PO) Take 1 capsule by mouth daily.   [name] omeprazole (PRILOSEC) 20 MG capsule Take 1 capsule (20 mg total) by mouth daily.  . Probiotic Product (ALIGN) 4 MG CAPS Take 4 mg by mouth at bedtime.   . Psyllium (EQ DAILY FIBER PO) Take 1 tablet by mouth at bedtime. Takes with align probiotic.  [name] senna (SENOKOT) 8.6 MG TABS tablet Take 1 tablet by mouth daily as needed (for constipation).  . sertraline (ZOLOFT) 100 MG tablet Take 1 tablet (100 mg total) by mouth daily. (Patient taking differently: Take 100 mg by mouth at bedtime. )  . Simethicone (GAS-X PO) Take 1 tablet by mouth as needed.   No facility-administered encounter medications on file as of 04/18/2017.     Activities of Daily Living In your present state of health, do you have any difficulty performing the following activities: 04/18/2017 02/06/2017  Hearing? Y -  Comment wears bilateral hearing aids -  Vision? Y -  Comment glaucoma in right eye -  Difficulty concentrating or making decisions? Y -  Walking or climbing stairs? Y -  Comment due to SOB -  Dressing or bathing? N -  Doing errands, shopping? N N  Preparing Food and eating ? N -  Using the Toilet? N -  In the past six months, have you accidently leaked urine? Y -  Comment wears protection -  Do you have problems with loss of bowel control? N -  Managing your Medications? N -  Managing your Finances? N -  Housekeeping or managing your Housekeeping? N -  Some recent data might be hidden    Patient Care Team: Jerrol Banana., MD as PCP - General (Family  Medicine) Leandrew Koyanagi, MD as Referring Physician (Ophthalmology) Manya Silvas, MD as Consulting Physician (Gastroenterology) Bary Castilla, Forest Gleason, MD (General Surgery) End, Harrell Gave, MD as Consulting Physician (Cardiology) [facility] [place] as Audiologist    Assessment:     Exercise Activities and Dietary recommendations Current Exercise Habits: The patient does not participate in regular exercise at present(stays active)  Goals    .  DIET - INCREASE WATER INTAKE     Recommend increasing water intake to 4-6 glasses a day.       Fall Risk Fall Risk  04/18/2017 04/07/2016 04/13/2015  Falls in the past year? No Yes Yes  Number falls in past yr: - 1 1  Injury with Fall? - Yes Yes  Comment - left foot fracture -  Follow up - - Falls prevention discussed   Depression Screen PHQ 2/9 Scores 04/18/2017 04/18/2017 04/07/2016 04/13/2015  PHQ - 2 Score 1 1 0 0  PHQ- 9 Score 2 - - -     Cognitive Function: Pt declined screening today.     6CIT Screen 04/07/2016  What Year? 0 points  What month? 0 points  What time? 0 points  Count back from 20 0 points  Months in reverse 0 points  Repeat phrase 0 points  Total Score 0    Immunization History  Administered Date(s) Administered  . Influenza, High Dose Seasonal PF 03/04/2015, 04/07/2016, 03/16/2017  . Pneumococcal Conjugate-13 02/05/2014  . Pneumococcal Polysaccharide-23 04/07/2016  . Tdap 03/18/2008   Screening Tests Health Maintenance  Topic Date Due  . Hepatitis C Screening  06-Jun-1945  . TETANUS/TDAP  03/18/2018  . MAMMOGRAM  08/12/2018  . COLONOSCOPY  05/18/2025  . INFLUENZA VACCINE  Completed  . DEXA SCAN  Completed  . PNA vac Low Risk Adult  Completed      Plan:  I have personally reviewed and addressed the Medicare Annual Wellness questionnaire and have noted the following in the patient's chart:  A. Medical and social history B. Use of alcohol, tobacco or illicit drugs  C. Current  medications and supplements D. Functional ability and status E.  Nutritional status F.  Physical activity G. Advance directives H. List of other physicians I.  Hospitalizations, surgeries, and ER visits in previous 12 months J.  [place] such as hearing and vision if needed, cognitive and depression L. Referrals and appointments - none  In addition, I have reviewed and discussed with patient certain preventive protocols, quality metrics, and best practice recommendations. A written personalized care plan for preventive services as well as general preventive health recommendations were provided to patient.  See attached scanned questionnaire for additional information.   Signed,  Fabio Neighbors, LPN Nurse Health Advisor   MD Recommendations: None. Pt declined the Hepatitis C screening today. Pt would like to have this completed with her other BW at next OV on 06/06/17.

## 2017-04-18 NOTE — Patient Instructions (Signed)
Sandra [name] , Thank you for taking time to come for your Medicare Wellness Visit. I appreciate your ongoing commitment to your health goals. Please review the following plan we discussed and let me know if I can assist you in the future.   Screening recommendations/referrals: Colonoscopy: Up to date Mammogram: Up to date Bone Density: Up to date Recommended yearly ophthalmology/optometry visit for glaucoma screening and checkup Recommended yearly dental visit for hygiene and checkup  Vaccinations: Influenza vaccine: completed Pneumococcal vaccine: completed Tdap vaccine: completed Shingles vaccine: declined  Advanced directives: Please bring a copy of your POA (Power of Attorney) and/or Living Will to your next appointment.   Conditions/risks identified: Recommend increasing water intake to 4-6 glasses a day.   Next appointment: 06/06/17 @ 10:15 AM   Preventive Care 65 Years and Older, Female Preventive care refers to lifestyle choices and visits with your health care provider that can promote health and wellness. What does preventive care include?  A yearly physical exam. This is also called an annual well check.  Dental exams once or twice a year.  Routine eye exams. Ask your health care provider how often you should have your eyes checked.  Personal lifestyle choices, including:  Daily care of your teeth and gums.  Regular physical activity.  Eating a healthy diet.  Avoiding tobacco and drug use.  Limiting alcohol use.  Practicing safe sex.  Taking low-dose aspirin every day.  Taking vitamin and mineral supplements as recommended by your health care provider. What happens during an annual well check? The services and screenings done by your health care provider during your annual well check will depend on your age, overall health, lifestyle risk factors, and family history of disease. Counseling  Your health care provider may ask you questions about  your:  Alcohol use.  Tobacco use.  Drug use.  Emotional well-being.  Home and relationship well-being.  Sexual activity.  Eating habits.  History of falls.  Memory and ability to understand (cognition).  Work and work Statistician.  Reproductive health. Screening  You may have the following tests or measurements:  Height, weight, and BMI.  Blood pressure.  Lipid and cholesterol levels. These may be checked every 5 years, or more frequently if you are over 80 years old.  Skin check.  Lung cancer screening. You may have this screening every year starting at age 5 if you have a 30-pack-year history of smoking and currently smoke or have quit within the past 15 years.  Fecal occult blood test (FOBT) of the stool. You may have this test every year starting at age 53.  Flexible sigmoidoscopy or colonoscopy. You may have a sigmoidoscopy every 5 years or a colonoscopy every 10 years starting at age 17.  Hepatitis C blood test.  Hepatitis B blood test.  Sexually transmitted disease (STD) testing.  Diabetes screening. This is done by checking your blood sugar (glucose) after you have not eaten for a while (fasting). You may have this done every 1-3 years.  Bone density scan. This is done to screen for osteoporosis. You may have this done starting at age 27.  Mammogram. This may be done every 1-2 years. Talk to your health care provider about how often you should have regular mammograms. Talk with your health care provider about your test results, treatment options, and if necessary, the need for more tests. Vaccines  Your health care provider may recommend certain vaccines, such as:  Influenza vaccine. This is recommended every year.  Tetanus,  diphtheria, and acellular pertussis (Tdap, Td) vaccine. You may need a Td booster every 10 years.  Zoster vaccine. You may need this after age 65.  Pneumococcal 13-valent conjugate (PCV13) vaccine. One dose is recommended  after age 30.  Pneumococcal polysaccharide (PPSV23) vaccine. One dose is recommended after age 71. Talk to your health care provider about which screenings and vaccines you need and how often you need them. This information is not intended to replace advice given to you by your health care provider. Make sure you discuss any questions you have with your health care provider. Document Released: 06/11/2015 Document Revised: 02/02/2016 Document Reviewed: 03/16/2015 Elsevier Interactive Patient Education  2017 [place] Prevention in the Home Falls can cause injuries. They can happen to people of all ages. There are many things you can do to make your home safe and to help prevent falls. What can I do on the outside of my home?  Regularly fix the edges of walkways and driveways and fix any cracks.  Remove anything that might make you trip as you walk through a door, such as a raised step or threshold.  Trim any bushes or trees on the path to your home.  Use bright outdoor lighting.  Clear any walking paths of anything that might make someone trip, such as rocks or tools.  Regularly check to see if handrails are loose or broken. Make sure that both sides of any steps have handrails.  Any raised decks and porches should have guardrails on the edges.  Have any leaves, snow, or ice cleared regularly.  Use sand or salt on walking paths during winter.  Clean up any spills in your garage right away. This includes oil or grease spills. What can I do in the bathroom?  Use night lights.  Install grab bars by the toilet and in the tub and shower. Do not use towel bars as grab bars.  Use non-skid mats or decals in the tub or shower.  If you need to sit down in the shower, use a plastic, non-slip stool.  Keep the floor dry. Clean up any water that spills on the floor as soon as it happens.  Remove soap buildup in the tub or shower regularly.  Attach bath mats securely with  double-sided non-slip rug tape.  Do not have throw rugs and other things on the floor that can make you trip. What can I do in the bedroom?  Use night lights.  Make sure that you have a light by your bed that is easy to reach.  Do not use any sheets or blankets that are too big for your bed. They should not hang down onto the floor.  Have a firm chair that has side arms. You can use this for support while you get dressed.  Do not have throw rugs and other things on the floor that can make you trip. What can I do in the kitchen?  Clean up any spills right away.  Avoid walking on wet floors.  Keep items that you use a lot in easy-to-reach places.  If you need to reach something above you, use a strong step stool that has a grab bar.  Keep electrical cords out of the way.  Do not use floor polish or wax that makes floors slippery. If you must use wax, use non-skid floor wax.  Do not have throw rugs and other things on the floor that can make you trip. What can I do with  my stairs?  Do not leave any items on the stairs.  Make sure that there are handrails on both sides of the stairs and use them. Fix handrails that are broken or loose. Make sure that handrails are as long as the stairways.  Check any carpeting to make sure that it is firmly attached to the stairs. Fix any carpet that is loose or worn.  Avoid having throw rugs at the top or bottom of the stairs. If you do have throw rugs, attach them to the floor with carpet tape.  Make sure that you have a light switch at the top of the stairs and the bottom of the stairs. If you do not have them, ask someone to add them for you. What else can I do to help prevent falls?  Wear shoes that:  Do not have high heels.  Have rubber bottoms.  Are comfortable and fit you well.  Are closed at the toe. Do not wear sandals.  If you use a stepladder:  Make sure that it is fully opened. Do not climb a closed stepladder.  Make  sure that both sides of the stepladder are locked into place.  Ask someone to hold it for you, if possible.  Clearly mark and make sure that you can see:  Any grab bars or handrails.  First and last steps.  Where the edge of each step is.  Use tools that help you move around (mobility aids) if they are needed. These include:  Canes.  Walkers.  Scooters.  Crutches.  Turn on the lights when you go into a dark area. Replace any light bulbs as soon as they burn out.  Set up your furniture so you have a clear path. Avoid moving your furniture around.  If any of your floors are uneven, fix them.  If there are any pets around you, be aware of where they are.  Review your medicines with your doctor. Some medicines can make you feel dizzy. This can increase your chance of falling. Ask your doctor what other things that you can do to help prevent falls. This information is not intended to replace advice given to you by your health care provider. Make sure you discuss any questions you have with your health care provider. Document Released: 03/11/2009 Document Revised: 10/21/2015 Document Reviewed: 06/19/2014 Elsevier Interactive Patient Education  2017 [employer].

## 2017-04-25 ENCOUNTER — Ambulatory Visit (INDEPENDENT_AMBULATORY_CARE_PROVIDER_SITE_OTHER): Payer: Medicare Other | Admitting: General Surgery

## 2017-04-25 ENCOUNTER — Encounter: Payer: Self-pay | Admitting: General Surgery

## 2017-04-25 VITALS — BP 144/76 | HR 67 | Resp 12 | Ht 64.0 in | Wt 129.0 lb

## 2017-04-25 DIAGNOSIS — C50911 Malignant neoplasm of unspecified site of right female breast: Secondary | ICD-10-CM

## 2017-04-25 NOTE — Progress Notes (Signed)
Patient ID: Sandra [name], female   DOB: 1946-01-08, 71 y.o.   MRN: [id number]  Chief Complaint  Patient presents with  . Follow-up    HPI Sandra [name] is a 71 y.o. female here today for her one month follow up seroma. Patient states the area has improved. She does notice occaisonal shooting pains.  HPI  Past Medical History:  Diagnosis Date  . Abnormality of left ventricle of heart    MILD-MODERATE THICKENING PER ECHO ON 01-18-17  . Anemia   . Anxiety   . Arthritis   . BRCA gene mutation negative 10/2016   [facility]  . Breast cancer ([place]) 10/16/2016   T1c,N0;, GRADE I/III, 1.6cm. ER/PR pos  HER2 not over expressed  . Celiac disease   . Depression   . Diastolic dysfunction   . Dyspnea    WITH EXERTION  . GERD (gastroesophageal reflux disease)    OCC  . Glaucoma    right eye  . Headache    H/O MIGRAINES AS TEENAGER  . Heart murmur    PT WAS TOLD ONCE SHE HAD MVP AND THEN ANOTHER MD SAID SHE DID NOT HAVE MVP  . Hypothyroidism   . Pneumonia    YEARS AGO  . Raynaud's disease   . RLS (restless legs syndrome)     Past Surgical History:  Procedure Laterality Date  . ABDOMINAL HYSTERECTOMY    . APPENDECTOMY    . AXILLARY LYMPH NODE BIOPSY Right 03/05/2017   Procedure: AXILLARY LYMPH NODE BIOPSY;  Surgeon: Robert Bellow, MD;  Location: [facility] ORS;  Service: General;  Laterality: Right;  . CATARACT EXTRACTION Bilateral   . CESAREAN SECTION    . COLONOSCOPY WITH PROPOFOL N/A 05/19/2015   Procedure: COLONOSCOPY WITH PROPOFOL;  Surgeon: Manya Silvas, MD;  Location: [facility];  Service: Endoscopy;  Laterality: N/A;  . EYE SURGERY    . HAND SURGERY Left   . SENTINEL NODE BIOPSY Right 11/07/2016   Procedure: SENTINEL NODE BIOPSY;  Surgeon: Robert Bellow, MD;  Location: [facility] ORS;  Service: General;  Laterality: Right;  . SIMPLE MASTECTOMY WITH AXILLARY SENTINEL NODE BIOPSY Right 11/07/2016   Procedure: SIMPLE MASTECTOMY;  Surgeon: Robert Bellow, MD;   Location: [facility] ORS;  Service: General;  Laterality: Right;  . TONSILLECTOMY  AGE 32  . UPPER GI ENDOSCOPY  01/12/06   hiatus hernia    Family History  Problem Relation Age of Onset  . Cancer Mother   . Anemia Mother   . Other Mother        lymphosarcoma  . Alcohol abuse Father   . Prostate cancer Father   . Breast cancer Sister        45; 1/2 sister, shared mother.  . Stroke Maternal Grandmother   . Breast cancer Maternal Grandmother 68  . Heart attack Maternal Grandfather   . CAD Paternal Grandfather     Social History Social History   Tobacco Use  . Smoking status: Never Smoker  . Smokeless tobacco: Never Used  Substance Use Topics  . Alcohol use: No  . Drug use: No    Allergies  Allergen Reactions  . Benadryl [Diphenhydramine Hcl (Sleep)] Other (See Comments)    Hyperactivity/causes opposite effect  . Advil [Ibuprofen] Rash    CAN TAKE NAPROSYN  . Claritin [Loratadine] Rash    Current Outpatient Medications  Medication Sig Dispense Refill  . acetaminophen (TYLENOL) 500 MG tablet Take 500 mg by mouth every 6 (six) hours as needed  for mild pain, moderate pain or headache.    . alendronate (FOSAMAX) 70 MG tablet TAKE 1 TABLET BY MOUTH  EVERY WEEK 12 tablet 3  . ALPRAZolam (XANAX) 0.5 MG tablet Take 1 tablet (0.5 mg total) by mouth at bedtime as needed for anxiety. (Patient taking differently: Take 0.5 mg by mouth at bedtime. ) 90 tablet 1  . Calcium Carbonate (CALCIUM 600 PO) Take 600 mg by mouth daily at 3 pm.     . carvedilol (COREG) 3.125 MG tablet Take 1 tablet (3.125 mg total) by mouth 2 (two) times daily. 180 tablet 3  . Cholecalciferol (VITAMIN D3) 10000 units TABS Take 1,000 Units by mouth daily at 3 pm.     . hydroxypropyl methylcellulose / hypromellose (ISOPTO TEARS / GONIOVISC) 2.5 % ophthalmic solution Place 1 drop into both eyes 4 (four) times daily as needed for dry eyes (scheduled each morning & then as needed).    . latanoprost (XALATAN) 0.005 %  ophthalmic solution INT 1 GTT IN OD 1 TIME IN THE EVE  5  . letrozole (FEMARA) 2.5 MG tablet Take 1 tablet (2.5 mg total) by mouth daily. 30 tablet 2  . levothyroxine (SYNTHROID, LEVOTHROID) 100 MCG tablet TAKE 1 TABLET BY MOUTH  DAILY (Patient taking differently: TAKE 1 TABLET (100 MCG) BY MOUTH  BEFORE BREAKFAST *30 MINS PRIOR TO BREAKFAST*) 90 tablet 3  . Multiple Vitamin (MULTIVITAMIN WITH MINERALS) TABS tablet Take 1 tablet by mouth daily at 3 pm.    . Omega-3 Fatty Acids (FISH OIL PO) Take 1 capsule by mouth daily.     [name] omeprazole (PRILOSEC) 20 MG capsule Take 1 capsule (20 mg total) by mouth daily. 90 capsule 3  . Probiotic Product (ALIGN) 4 MG CAPS Take 4 mg by mouth at bedtime.     . Psyllium (EQ DAILY FIBER PO) Take 1 tablet by mouth at bedtime. Takes with align probiotic.    [name] senna (SENOKOT) 8.6 MG TABS tablet Take 1 tablet by mouth daily as needed (for constipation).    . sertraline (ZOLOFT) 100 MG tablet Take 1 tablet (100 mg total) by mouth daily. (Patient taking differently: Take 100 mg by mouth at bedtime. ) 90 tablet 3  . Simethicone (GAS-X PO) Take 1 tablet by mouth as needed.     No current facility-administered medications for this visit.     Review of Systems Review of Systems  Constitutional: Negative.   Respiratory: Negative.   Cardiovascular: Negative.     Blood pressure (!) 144/76, pulse 67, resp. rate 12, height 5' 4"  (1.626 m), weight 129 lb (58.5 kg), SpO2 97 %.  Physical Exam Physical Exam  Constitutional: She is oriented to person, place, and time. She appears well-developed and well-nourished.  Pulmonary/Chest:    Right mastectomy incision clean   Musculoskeletal:  Excellent upper extremity range of motion  Neurological: She is alert and oriented to person, place, and time.  Skin: Skin is warm and dry.  Psychiatric: Her behavior is normal.       Assessment    No evidence of of recurrent axillary seroma.    Plan        Follow up in 4  months with left screening mammogram.  HPI, Physical Exam, Assessment and Plan have been scribed under the direction and in the presence of Robert Bellow, MD. Karie Fetch, RN   Robert Bellow 04/25/2017, 11:45 AM

## 2017-04-25 NOTE — Patient Instructions (Signed)
The patient is aware to call back for any questions or concerns.  

## 2017-05-15 NOTE — Progress Notes (Signed)
Follow-up Outpatient Visit Date: 05/16/2017  Primary Care Provider: Jerrol Banana., MD [address] [place] [address]  Chief Complaint: Dyspnea on exertion  HPI:  Sandra [name] is a 71 y.o. year-old female with history of breast cancer, celiac disease, hypothyroidism, Raynaud's disease, restless leg syndrome, GERD, and anxiety, who presents for follow-up of exertional dyspnea. I last saw her in September, at which time she continued to have some DOE. Symptoms were attributed to LVH with diastolic dysfunction and suspected dynamic intracavitary gradient. We restarted carvedilol 3.125 mg BID, which she had been tolerating well before losing her prescription. Myocardial perfusion stress test was normal without ischemia/scar.  Today, Sandra [name] reports that she is doing well. Exertional dyspnea is stable to improved, the she still notices it when walking up an incline. She is trying to stay active. She denies chest pain, edema, and lightheadedness. She has experienced rare flutters in the chest that last a few seconds and do not have any accompanying symptoms. She is not limiting her salt intake very much.  --------------------------------------------------------------------------------------------------   Cardiovascular History & Procedures: Cardiovascular Problems:  Abnormal EKG  Dyspnea on exertion  Risk Factors:  Age greater than 60  Cath/PCI:  None  CV Surgery:  None  EP Procedures and Devices:  None  Non-Invasive Evaluation(s):  Pharmacologic MPI (02/15/17): Low risk study without ischemia or scar. Hyperdynamic LVEF (>65%).  TTE (01/18/17): Normal LV size with mild LVH and moderate hypertrophy of the septum. No significant outflow tract gradient noted though near complete obliteration of the cavity was noted in systole. LVEF 60-65% with normal wall motion. Grade 2 diastolic dysfunction. Mild mitral regurgitation. Mild left atrial  enlargement. Normal RV size and function. Normal pulmonary artery pressure.  Stress test (approximately 2012): Normal per patient.  Recent CV Pertinent Labs: Lab Results  Component Value Date   CHOL 180 05/31/2016   HDL 43 05/31/2016   LDLCALC 106 (H) 05/31/2016   TRIG 155 (H) 05/31/2016   K 3.6 02/07/2017   BUN 14 02/07/2017   BUN 12 05/31/2016   CREATININE 0.54 02/07/2017    Past medical and surgical history were reviewed and updated in EPIC.  Current Meds  Medication Sig  . acetaminophen (TYLENOL) 500 MG tablet Take 500 mg by mouth every 6 (six) hours as needed for mild pain, moderate pain or headache.  . alendronate (FOSAMAX) 70 MG tablet TAKE 1 TABLET BY MOUTH  EVERY WEEK  . ALPRAZolam (XANAX) 0.5 MG tablet Take 1 tablet (0.5 mg total) by mouth at bedtime as needed for anxiety. (Patient taking differently: Take 0.5 mg by mouth at bedtime. )  . Calcium Carbonate (CALCIUM 600 PO) Take 600 mg by mouth daily at 3 pm.   . Cholecalciferol (VITAMIN D3) 10000 units TABS Take 1,000 Units by mouth daily at 3 pm.   . hydroxypropyl methylcellulose / hypromellose (ISOPTO TEARS / GONIOVISC) 2.5 % ophthalmic solution Place 1 drop into both eyes 4 (four) times daily as needed for dry eyes (scheduled each morning & then as needed).  . latanoprost (XALATAN) 0.005 % ophthalmic solution INT 1 GTT IN OD 1 TIME IN THE EVE  . letrozole (FEMARA) 2.5 MG tablet Take 1 tablet (2.5 mg total) by mouth daily.  [name] levothyroxine (SYNTHROID, LEVOTHROID) 100 MCG tablet TAKE 1 TABLET BY MOUTH  DAILY (Patient taking differently: TAKE 1 TABLET (100 MCG) BY MOUTH  BEFORE BREAKFAST *30 MINS PRIOR TO BREAKFAST*)  . Multiple Vitamin (MULTIVITAMIN WITH MINERALS) TABS tablet  Take 1 tablet by mouth daily at 3 pm.  . Omega-3 Fatty Acids (FISH OIL PO) Take 1 capsule by mouth daily.   [name] omeprazole (PRILOSEC) 20 MG capsule Take 1 capsule (20 mg total) by mouth daily.  . Probiotic Product (ALIGN) 4 MG CAPS Take 4 mg by mouth at  bedtime.   . Psyllium (EQ DAILY FIBER PO) Take 1 tablet by mouth at bedtime. Takes with align probiotic.  [name] senna (SENOKOT) 8.6 MG TABS tablet Take 1 tablet by mouth daily as needed (for constipation).  . sertraline (ZOLOFT) 100 MG tablet Take 1 tablet (100 mg total) by mouth daily. (Patient taking differently: Take 100 mg by mouth at bedtime. )  . Simethicone (GAS-X PO) Take 1 tablet by mouth as needed.    Allergies: Benadryl [diphenhydramine hcl (sleep)]; Advil [ibuprofen]; and Claritin [loratadine]  Social History   Socioeconomic History  . Marital status: Married    Spouse name: Not on file  . Number of children: Not on file  . Years of education: Not on file  . Highest education level: Not on file  Social Needs  . Financial resource strain: Not on file  . Food insecurity - worry: Not on file  . Food insecurity - inability: Not on file  . Transportation needs - medical: Not on file  . Transportation needs - non-medical: Not on file  Occupational History  . Not on file  Tobacco Use  . Smoking status: Never Smoker  . Smokeless tobacco: Never Used  Substance and Sexual Activity  . Alcohol use: No  . Drug use: No  . Sexual activity: Not Currently    Birth control/protection: None  Other Topics Concern  . Not on file  Social History Narrative  . Not on file    Family History  Problem Relation Age of Onset  . Cancer Mother   . Anemia Mother   . Other Mother        lymphosarcoma  . Alcohol abuse Father   . Prostate cancer Father   . Breast cancer Sister        72; 1/2 sister, shared mother.  . Stroke Maternal Grandmother   . Breast cancer Maternal Grandmother 68  . Heart attack Maternal Grandfather   . CAD Paternal Grandfather     Review of Systems: A 12-system review of systems was performed and was negative except as noted in the HPI.  --------------------------------------------------------------------------------------------------  Physical Exam: BP (!)  160/82 (BP Location: Left Arm, Patient Position: Sitting, Cuff Size: Normal)   Pulse 62   Ht 5' 3"  (1.6 m)   Wt 129 lb 8 oz (58.7 kg)   BMI 22.94 kg/m   General:  Thin woman, seated comfortably in the exam room. HEENT: No conjunctival pallor or scleral icterus. Moist mucous membranes.  OP clear. Neck: Supple without lymphadenopathy, thyromegaly, JVD, or HJR. No carotid bruit. Lungs: Normal work of breathing. Clear to auscultation bilaterally without wheezes or crackles. Heart: Regular rate and rhythm with 2/6 crescendo-decrescendo systolic murmur loudest at the LLSB. No rubs or gallops. Non-displaced PMI. Abd: Bowel sounds present. Soft, NT/ND without hepatosplenomegaly Ext: No lower extremity edema. Radial, PT, and DP pulses are 2+ bilaterally. Skin: Warm and dry without rash.  EKG:  NSR, LVH with abnormal repolarization, and poor R-wave progression in V1-2.  Lab Results  Component Value Date   WBC 5.1 02/07/2017   HGB 12.1 02/07/2017   HCT 35.5 02/07/2017   MCV 88.2 02/07/2017   PLT 246 02/07/2017  Lab Results  Component Value Date   NA 139 02/07/2017   K 3.6 02/07/2017   CL 105 02/07/2017   CO2 26 02/07/2017   BUN 14 02/07/2017   CREATININE 0.54 02/07/2017   GLUCOSE 98 02/07/2017   ALT 22 05/31/2016    Lab Results  Component Value Date   CHOL 180 05/31/2016   HDL 43 05/31/2016   LDLCALC 106 (H) 05/31/2016   TRIG 155 (H) 05/31/2016    --------------------------------------------------------------------------------------------------  ASSESSMENT AND PLAN: Chronic diastolic heart failure Symptoms are stable from our last visit and have improved significantly with the addition of low-dose carvedilol. In addition to diastolic dysfunction, I suspect that she has dynamic LVOT obstruction that contributes to her symptoms. We will continue to focus on medical therapy with heart rate and BP control. Borderline low resting heart rate precludes uptitration of carvedilol  today. Sandra [name] appears euvolemic with NYHA class II symptoms.  Hypertension Blood pressure poorly controlled. We will continue carvedilol 3.125 mg daily and start amlodipine 5 mg daily. Low sodium diet was also encouraged. I will have Sandra [name] return for a blood pressure check in 6 months.  Follow-up: RN visit for BP check in 1 month; return to see me in 6 months.  Nelva Bush, MD 05/16/2017 3:48 PM

## 2017-05-16 ENCOUNTER — Encounter: Payer: Self-pay | Admitting: Internal Medicine

## 2017-05-16 ENCOUNTER — Ambulatory Visit: Payer: Medicare Other | Admitting: Internal Medicine

## 2017-05-16 VITALS — BP 160/82 | HR 62 | Ht 63.0 in | Wt 129.5 lb

## 2017-05-16 DIAGNOSIS — I5032 Chronic diastolic (congestive) heart failure: Secondary | ICD-10-CM | POA: Diagnosis not present

## 2017-05-16 DIAGNOSIS — I10 Essential (primary) hypertension: Secondary | ICD-10-CM

## 2017-05-16 MED ORDER — AMLODIPINE BESYLATE 5 MG PO TABS: 5.0000 mg | ORAL_TABLET | Freq: Every day | ORAL | 6 refills | Status: DC

## 2017-05-16 NOTE — Patient Instructions (Signed)
Medication Instructions:  Your physician has recommended you make the following change in your medication:  1- START Amlodipine 5 mg (1 tablet) by mouth once a day.   Labwork: NONE  Testing/Procedures: NONE  Follow-Up: Your physician recommends that you schedule a follow-up appointment in: 1 MONTH FOR BLOOD PRESSURE FOR NURSE VISIT.   Your physician wants you to follow-up in: [place].  You will receive a reminder letter in the mail two months in advance. If you don't receive a letter, please call our office to schedule the follow-up appointment.   If you need a refill on your cardiac medications before your next appointment, please call your pharmacy.

## 2017-05-17 ENCOUNTER — Encounter: Payer: Self-pay | Admitting: Internal Medicine

## 2017-05-17 DIAGNOSIS — I5033 Acute on chronic diastolic (congestive) heart failure: Secondary | ICD-10-CM | POA: Insufficient documentation

## 2017-05-17 DIAGNOSIS — I5032 Chronic diastolic (congestive) heart failure: Secondary | ICD-10-CM | POA: Insufficient documentation

## 2017-05-17 DIAGNOSIS — I10 Essential (primary) hypertension: Secondary | ICD-10-CM | POA: Insufficient documentation

## 2017-06-06 ENCOUNTER — Encounter: Payer: Self-pay | Admitting: Family Medicine

## 2017-06-06 ENCOUNTER — Ambulatory Visit (INDEPENDENT_AMBULATORY_CARE_PROVIDER_SITE_OTHER): Payer: Medicare HMO | Admitting: Family Medicine

## 2017-06-06 VITALS — BP 126/72 | HR 66 | Temp 97.6°F | Resp 16 | Ht 63.0 in | Wt 130.0 lb

## 2017-06-06 DIAGNOSIS — E038 Other specified hypothyroidism: Secondary | ICD-10-CM

## 2017-06-06 DIAGNOSIS — Z1159 Encounter for screening for other viral diseases: Secondary | ICD-10-CM

## 2017-06-06 DIAGNOSIS — Z0000 Encounter for general adult medical examination without abnormal findings: Secondary | ICD-10-CM

## 2017-06-06 DIAGNOSIS — I10 Essential (primary) hypertension: Secondary | ICD-10-CM

## 2017-06-06 DIAGNOSIS — R7303 Prediabetes: Secondary | ICD-10-CM

## 2017-06-06 DIAGNOSIS — E7849 Other hyperlipidemia: Secondary | ICD-10-CM

## 2017-06-06 DIAGNOSIS — M810 Age-related osteoporosis without current pathological fracture: Secondary | ICD-10-CM | POA: Diagnosis not present

## 2017-06-06 DIAGNOSIS — K219 Gastro-esophageal reflux disease without esophagitis: Secondary | ICD-10-CM | POA: Diagnosis not present

## 2017-06-06 NOTE — Progress Notes (Signed)
Patient: Sandra [name], Female    DOB: July 13, 1945, 72 y.o.   MRN: [id number] Visit Date: 06/06/2017  Today's Provider: [name], MD   Chief Complaint  Patient presents with  . Annual Exam   Subjective:    Annual physical exam Sandra [name] is a 72 y.o. female who presents today for health maintenance and complete physical. She feels well. She reports she is not exercising, but is very active. She reports she is sleeping well, with Xanax.  ----------------------------------------------------------------- Colonoscopy- 05/19/15 Vira Agar 4 polyps, tubular adenoma, internal hemorrhoids Mammogram- 08/11/16 neg, found breast nodule right Breast cancer, followed by Dr. Bary Castilla Pap- 12/14/11 norma- pt had hysterectomy BMD- 10/14/12 osteoporosis and osteopenia   Immunization History  Administered Date(s) Administered  . Influenza, High Dose Seasonal PF 03/04/2015, 04/07/2016, 03/16/2017  . Pneumococcal Conjugate-13 02/05/2014  . Pneumococcal Polysaccharide-23 04/07/2016  . Tdap 03/18/2008      Review of Systems  Constitutional: Negative.   HENT: Positive for hearing loss, sinus pressure and trouble swallowing.   Eyes: Negative.   Respiratory: Positive for shortness of breath.   Cardiovascular: Negative.   Gastrointestinal: Positive for abdominal distention and constipation.  Endocrine: Positive for cold intolerance.  Genitourinary: Positive for enuresis.       Some urge incontinence.Wears a pad.  Musculoskeletal: Positive for neck pain.  Skin: Negative.   Allergic/Immunologic: Positive for environmental allergies.  Neurological: Negative.   Hematological: Negative.   Psychiatric/Behavioral: Negative.     Social History      She  reports that  has never smoked. she has never used smokeless tobacco. She reports that she does not drink alcohol or use drugs.       Social History   Socioeconomic History  . Marital status: Married    Spouse name: None  .  Number of children: None  . Years of education: None  . Highest education level: None  Social Needs  . Financial resource strain: None  . Food insecurity - worry: None  . Food insecurity - inability: None  . Transportation needs - medical: None  . Transportation needs - non-medical: None  Occupational History  . None  Tobacco Use  . Smoking status: Never Smoker  . Smokeless tobacco: Never Used  Substance and Sexual Activity  . Alcohol use: No  . Drug use: No  . Sexual activity: Not Currently    Birth control/protection: None  Other Topics Concern  . None  Social History Narrative  . None    Past Medical History:  Diagnosis Date  . Abnormality of left ventricle of heart    MILD-MODERATE THICKENING PER ECHO ON 01-18-17  . Anemia   . Anxiety   . Arthritis   . BRCA gene mutation negative 10/2016   [facility]  . Breast cancer ([place]) 10/16/2016   T1c,N0;, GRADE I/III, 1.6cm. ER/PR pos  HER2 not over expressed  . Celiac disease   . Depression   . Diastolic dysfunction   . Dyspnea    WITH EXERTION  . GERD (gastroesophageal reflux disease)    OCC  . Glaucoma    right eye  . Headache    H/O MIGRAINES AS TEENAGER  . Heart murmur    PT WAS TOLD ONCE SHE HAD MVP AND THEN ANOTHER MD SAID SHE DID NOT HAVE MVP  . Hypothyroidism   . Pneumonia    YEARS AGO  . Raynaud's disease   . RLS (restless legs syndrome)  Patient Active Problem List   Diagnosis Date Noted  . Chronic diastolic heart failure ([place]) 05/17/2017  . Essential hypertension 05/17/2017  . Glaucoma (increased eye pressure) 02/06/2017  . Invasive ductal carcinoma of breast, female, right ([place]) 10/20/2016  . Prolapse of vaginal vault after hysterectomy 08/24/2016  . Vaginal atrophy 08/24/2016  . Dyspareunia in female 08/24/2016  . Colon polyps 01/06/2015  . BCC (basal cell carcinoma of skin) 01/06/2015  . Anemia 01/06/2015  . Hypothyroidism 01/06/2015  . Vitamin D deficiency 01/06/2015  . Hyperlipidemia  01/06/2015  . Depression, major 01/06/2015  . GAD (generalized anxiety disorder) 01/06/2015  . RLS (restless legs syndrome) 01/06/2015  . Cataract 01/06/2015  . Raynaud phenomenon 01/06/2015  . GERD (gastroesophageal reflux disease) 01/06/2015  . Celiac disease 01/06/2015  . OA (osteoarthritis) 01/06/2015  . Osteoporosis 01/06/2015  . Pre-diabetes 01/06/2015  . Avitaminosis D 01/06/2015  . Raynaud's syndrome without gangrene 01/06/2015  . Age-related osteoporosis without current pathological fracture 01/06/2015  . Anxiety disorder 01/05/2015    Past Surgical History:  Procedure Laterality Date  . ABDOMINAL HYSTERECTOMY    . APPENDECTOMY    . AXILLARY LYMPH NODE BIOPSY Right 03/05/2017   Procedure: AXILLARY LYMPH NODE BIOPSY;  Surgeon: Robert Bellow, MD;  Location: [facility] ORS;  Service: General;  Laterality: Right;  . CATARACT EXTRACTION Bilateral   . CESAREAN SECTION    . COLONOSCOPY WITH PROPOFOL N/A 05/19/2015   Procedure: COLONOSCOPY WITH PROPOFOL;  Surgeon: Manya Silvas, MD;  Location: [facility] ENDOSCOPY;  Service: Endoscopy;  Laterality: N/A;  . EYE SURGERY    . HAND SURGERY Left   . SENTINEL NODE BIOPSY Right 11/07/2016   Procedure: SENTINEL NODE BIOPSY;  Surgeon: Robert Bellow, MD;  Location: [facility] ORS;  Service: General;  Laterality: Right;  . SIMPLE MASTECTOMY WITH AXILLARY SENTINEL NODE BIOPSY Right 11/07/2016   Procedure: SIMPLE MASTECTOMY;  Surgeon: Robert Bellow, MD;  Location: [facility] ORS;  Service: General;  Laterality: Right;  . TONSILLECTOMY  AGE 26  . UPPER GI ENDOSCOPY  01/12/06   hiatus hernia    Family History        Family Status  Relation Name Status  . Mother  Deceased at age 74  . Father  Deceased at age 29       from complications of the fall  . Sister  Alive  . Brother  Deceased at age 44       due to [place]  . MGM  Deceased  . MGF  Deceased  . PGF  Deceased        Her family history includes Alcohol abuse in her father; Anemia in her  mother; Breast cancer in her sister; Breast cancer (age of onset: 77) in her maternal grandmother; CAD in her paternal grandfather; Cancer in her mother; Heart attack in her maternal grandfather; Other in her mother; Prostate cancer in her father; Stroke in her maternal grandmother.     Allergies  Allergen Reactions  . Benadryl [Diphenhydramine Hcl (Sleep)] Other (See Comments)    Hyperactivity/causes opposite effect  . Advil [Ibuprofen] Rash    CAN TAKE NAPROSYN  . Claritin [Loratadine] Rash     Current Outpatient Medications:  .  acetaminophen (TYLENOL) 500 MG tablet, Take 500 mg by mouth every 6 (six) hours as needed for mild pain, moderate pain or headache., Disp: , Rfl:  .  alendronate (FOSAMAX) 70 MG tablet, TAKE 1 TABLET BY MOUTH  EVERY WEEK, Disp: 12 tablet, Rfl: 3 .  ALPRAZolam (XANAX) 0.5 MG tablet, Take 1 tablet (0.5 mg total) by mouth at bedtime as needed for anxiety. (Patient taking differently: Take 0.5 mg by mouth at bedtime. ), Disp: 90 tablet, Rfl: 1 .  amLODipine (NORVASC) 5 MG tablet, Take 1 tablet (5 mg total) by mouth daily., Disp: 30 tablet, Rfl: 6 .  Calcium Carbonate (CALCIUM 600 PO), Take 600 mg by mouth daily at 3 pm. , Disp: , Rfl:  .  Cholecalciferol (VITAMIN D3) 10000 units TABS, Take 1,000 Units by mouth daily at 3 pm. , Disp: , Rfl:  .  hydroxypropyl methylcellulose / hypromellose (ISOPTO TEARS / GONIOVISC) 2.5 % ophthalmic solution, Place 1 drop into both eyes 4 (four) times daily as needed for dry eyes (scheduled each morning & then as needed)., Disp: , Rfl:  .  latanoprost (XALATAN) 0.005 % ophthalmic solution, INT 1 GTT IN OD 1 TIME IN THE EVE, Disp: , Rfl: 5 .  letrozole (FEMARA) 2.5 MG tablet, Take 1 tablet (2.5 mg total) by mouth daily., Disp: 30 tablet, Rfl: 2 .  levothyroxine (SYNTHROID, LEVOTHROID) 100 MCG tablet, TAKE 1 TABLET BY MOUTH  DAILY (Patient taking differently: TAKE 1 TABLET (100 MCG) BY MOUTH  BEFORE BREAKFAST *30 MINS PRIOR TO BREAKFAST*),  Disp: 90 tablet, Rfl: 3 .  Multiple Vitamin (MULTIVITAMIN WITH MINERALS) TABS tablet, Take 1 tablet by mouth daily at 3 pm., Disp: , Rfl:  .  Omega-3 Fatty Acids (FISH OIL PO), Take 1 capsule by mouth daily. , Disp: , Rfl:  .  omeprazole (PRILOSEC) 20 MG capsule, Take 1 capsule (20 mg total) by mouth daily., Disp: 90 capsule, Rfl: 3 .  Probiotic Product (ALIGN) 4 MG CAPS, Take 4 mg by mouth at bedtime. , Disp: , Rfl:  .  Psyllium (EQ DAILY FIBER PO), Take 1 tablet by mouth at bedtime. Takes with align probiotic., Disp: , Rfl:  .  senna (SENOKOT) 8.6 MG TABS tablet, Take 1 tablet by mouth daily as needed (for constipation)., Disp: , Rfl:  .  sertraline (ZOLOFT) 100 MG tablet, Take 1 tablet (100 mg total) by mouth daily. (Patient taking differently: Take 100 mg by mouth at bedtime. ), Disp: 90 tablet, Rfl: 3 .  Simethicone (GAS-X PO), Take 1 tablet by mouth as needed., Disp: , Rfl:  .  carvedilol (COREG) 3.125 MG tablet, Take 1 tablet (3.125 mg total) by mouth 2 (two) times daily., Disp: 180 tablet, Rfl: 3   Patient Care Team: Jerrol Banana., MD as PCP - General (Family Medicine) Leandrew Koyanagi, MD as Referring Physician (Ophthalmology) Manya Silvas, MD as Consulting Physician (Gastroenterology) Bary Castilla, Forest Gleason, MD (General Surgery) End, Harrell Gave, MD as Consulting Physician (Cardiology) [facility] [place] as Audiologist      Objective:   Vitals: BP 126/72 (BP Location: Left Arm, Patient Position: Sitting, Cuff Size: Normal)   Pulse 66   Temp 97.6 F (36.4 C) (Oral)   Resp 16   Ht 5' 3"  (1.6 m)   Wt 130 lb (59 kg)   BMI 23.03 kg/m    Vitals:   06/06/17 1034  BP: 126/72  Pulse: 66  Resp: 16  Temp: 97.6 F (36.4 C)  TempSrc: Oral  Weight: 130 lb (59 kg)  Height: 5' 3"  (1.6 m)     Physical Exam  Constitutional: She is oriented to person, place, and time. She appears well-developed and well-nourished.  Eyes: Conjunctivae and EOM are normal.  Pupils are equal, round, and reactive to light.  Neck: Normal range of motion. Neck supple.  Cardiovascular: Normal rate, regular rhythm, normal heart sounds and intact distal pulses.  Pulmonary/Chest: Effort normal and breath sounds normal.  Musculoskeletal: Normal range of motion.  Left index trigger finger.  Neurological: She is alert and oriented to person, place, and time. She has normal reflexes.  Skin: Skin is warm and dry.  Psychiatric: She has a normal mood and affect. Her behavior is normal. Judgment and thought content normal.     Depression Screen PHQ 2/9 Scores 06/06/2017 04/18/2017 04/18/2017 04/07/2016  PHQ - 2 Score 0 1 1 0  PHQ- 9 Score 1 2 - -      Assessment & Plan:     Routine Health Maintenance and Physical Exam  Exercise Activities and Dietary recommendations Goals    . DIET - INCREASE WATER INTAKE     Recommend increasing water intake to 4-6 glasses a day.        Immunization History  Administered Date(s) Administered  . Influenza, High Dose Seasonal PF 03/04/2015, 04/07/2016, 03/16/2017  . Pneumococcal Conjugate-13 02/05/2014  . Pneumococcal Polysaccharide-23 04/07/2016  . Tdap 03/18/2008    Health Maintenance  Topic Date Due  . Hepatitis C Screening  11-18-1945  . TETANUS/TDAP  03/18/2018  . MAMMOGRAM  08/12/2018  . COLONOSCOPY  05/18/2025  . INFLUENZA VACCINE  Completed  . DEXA SCAN  Completed  . PNA vac Low Risk Adult  Completed    Colonoscopy Dec 2019. Discussed health benefits of physical activity, and encouraged her to engage in regular exercise appropriate for her age and condition.  OAB Trigger Finger Osteoporosis Check BMD.   --------------------------------------------------------------------   I have done the exam and reviewed the above chart and it is accurate to the best of my knowledge. Development worker, community has been used in this note in any air is in the dictation or transcription are unintentional.  [name],  MD  [place]

## 2017-06-07 DIAGNOSIS — I10 Essential (primary) hypertension: Secondary | ICD-10-CM | POA: Diagnosis not present

## 2017-06-07 DIAGNOSIS — E7849 Other hyperlipidemia: Secondary | ICD-10-CM | POA: Diagnosis not present

## 2017-06-07 DIAGNOSIS — Z1159 Encounter for screening for other viral diseases: Secondary | ICD-10-CM | POA: Diagnosis not present

## 2017-06-08 LAB — COMPREHENSIVE METABOLIC PANEL
ALT: 28 IU/L (ref 0–32)
AST: 23 IU/L (ref 0–40)
Albumin/Globulin Ratio: 2 (ref 1.2–2.2)
Albumin: 4.3 g/dL (ref 3.5–4.8)
Alkaline Phosphatase: 61 IU/L (ref 39–117)
BUN/Creatinine Ratio: 18 (ref 12–28)
BUN: 11 mg/dL (ref 8–27)
Bilirubin Total: 0.3 mg/dL (ref 0.0–1.2)
CO2: 27 mmol/L (ref 20–29)
Calcium: 8.9 mg/dL (ref 8.7–10.3)
Chloride: 103 mmol/L (ref 96–106)
Creatinine, Ser: 0.62 mg/dL (ref 0.57–1.00)
GFR calc Af Amer: 104 mL/min/{1.73_m2} (ref >59)
GFR calc non Af Amer: 90 mL/min/{1.73_m2} (ref >59)
Globulin, Total: 2.1 g/dL (ref 1.5–4.5)
Glucose: 103 mg/dL — ABNORMAL HIGH (ref 65–99)
Potassium: 3.8 mmol/L (ref 3.5–5.2)
Sodium: 141 mmol/L (ref 134–144)
Total Protein: 6.4 g/dL (ref 6.0–8.5)

## 2017-06-08 LAB — CBC WITH DIFFERENTIAL/PLATELET
Basophils Absolute: 0 10*3/uL (ref 0.0–0.2)
Basos: 0 %
EOS (ABSOLUTE): 0 10*3/uL (ref 0.0–0.4)
Eos: 1 %
Hematocrit: 38.6 % (ref 34.0–46.6)
Hemoglobin: 12.6 g/dL (ref 11.1–15.9)
Immature Grans (Abs): 0 10*3/uL (ref 0.0–0.1)
Immature Granulocytes: 0 %
Lymphocytes Absolute: 1.5 10*3/uL (ref 0.7–3.1)
Lymphs: 30 %
MCH: 28.1 pg (ref 26.6–33.0)
MCHC: 32.6 g/dL (ref 31.5–35.7)
MCV: 86 fL (ref 79–97)
Monocytes Absolute: 0.5 10*3/uL (ref 0.1–0.9)
Monocytes: 11 %
Neutrophils Absolute: 2.8 10*3/uL (ref 1.4–7.0)
Neutrophils: 58 %
Platelets: 235 10*3/uL (ref 150–379)
RBC: 4.48 x10E6/uL (ref 3.77–5.28)
RDW: 14.5 % (ref 12.3–15.4)
WBC: 4.9 10*3/uL (ref 3.4–10.8)

## 2017-06-08 LAB — LIPID PANEL
Chol/HDL Ratio: 3.8 ratio (ref 0.0–4.4)
Cholesterol, Total: 207 mg/dL — ABNORMAL HIGH (ref 100–199)
HDL: 54 mg/dL (ref >39)
LDL Calculated: 118 mg/dL — ABNORMAL HIGH (ref 0–99)
Triglycerides: 176 mg/dL — ABNORMAL HIGH (ref 0–149)
VLDL Cholesterol Cal: 35 mg/dL (ref 5–40)

## 2017-06-08 LAB — TSH: TSH: 3.42 u[IU]/mL (ref 0.450–4.500)

## 2017-06-08 LAB — HEPATITIS C ANTIBODY: Hep C Virus Ab: <0.1 s/co ratio (ref 0.0–0.9)

## 2017-06-18 ENCOUNTER — Ambulatory Visit (INDEPENDENT_AMBULATORY_CARE_PROVIDER_SITE_OTHER): Payer: Medicare HMO | Admitting: *Deleted

## 2017-06-18 VITALS — BP 132/62 | HR 72 | Ht 63.0 in | Wt 127.0 lb

## 2017-06-18 DIAGNOSIS — I10 Essential (primary) hypertension: Secondary | ICD-10-CM

## 2017-06-18 NOTE — Progress Notes (Signed)
1.) Reason for visit: BP check  2.) Name of MD requesting visit: End  3.) H&P: Patient with a history of dyspnea on exertion followed by Dr. [name]. She was last seen on 05/16/17 by Dr. [name] and her BP was 160/82. She was already taking coreg 3.125 mg BID and continues to do so. Amlodipine 5 mg once daily was started on 05/16/17.   4.) ROS related to problem: The patient is tolerating the addition of amlodipine. She continues with some dyspnea, but this with with walking up an incline. Last BP with Dr. Rosanna Randy on 06/06/17 was 126/72. She is 132/62 in the office today. She did take her medications this morning.   5.) Assessment and plan per MD: Will forward to Dr. [name] to review. The patient was instructed to continue her current medications as prescribed. She is aware we will call her back if any further recommendations are made. Otherwise, she is to follow up with Dr. [name] in June 2019.

## 2017-06-18 NOTE — Patient Instructions (Signed)
Medication Instructions: - Your physician recommends that you continue on your current medications as directed. Please refer to the Current Medication list given to you today.  Labwork: - none ordered  Procedures/Testing: - none ordered  Follow-Up: Your physician wants you to follow-up in: June with Dr. [name]. You will receive a reminder letter in the mail two months in advance. If you don't receive a letter, please call our office to schedule the follow-up appointment.   Any Additional Special Instructions Will Be Listed Below (If Applicable).     If you need a refill on your cardiac medications before your next appointment, please call your pharmacy.

## 2017-06-19 ENCOUNTER — Telehealth: Payer: Self-pay | Admitting: *Deleted

## 2017-06-19 NOTE — Progress Notes (Signed)
BP is reasonably well controlled. [name] should continue her current medications and f/u as previously discussed.  Sandra Bush, MD [facility] Pager: [phone]

## 2017-06-19 NOTE — Telephone Encounter (Signed)
-----   Message from Georgana Curio sent at 06/18/2017 11:24 AM EST ----- Contact: [place],  Sandra [name] stopped by today and asked that you give her a call about a medical question.  Not urgent, but she wanted to speak to a nurse when you have a minute. Please call her at [phone].  Thanks.

## 2017-06-19 NOTE — Telephone Encounter (Signed)
I talked with the patient and she states that for about 2 weeks or so she has noticed quick sudden pressure discomfort at her mastectomy site. She states it doesn't last long. Not related to activity. No redness or swelling noted. She will do a trial of tylenol and heating pad and report back with update. She may send a MyChart message if she desires or she may wait til her follow up in March.

## 2017-06-21 ENCOUNTER — Encounter: Payer: Self-pay | Admitting: Family Medicine

## 2017-06-23 ENCOUNTER — Encounter: Payer: Self-pay | Admitting: Internal Medicine

## 2017-06-26 ENCOUNTER — Other Ambulatory Visit: Payer: Self-pay

## 2017-06-26 DIAGNOSIS — F419 Anxiety disorder, unspecified: Secondary | ICD-10-CM

## 2017-06-26 NOTE — Telephone Encounter (Signed)
Patient requested refill for xanax for RLS. Takes 1 at bedtime. Requests 90 day supply. Please Mackie Pai, RMA

## 2017-06-26 NOTE — Telephone Encounter (Signed)
Pt is calling about medication and wanted to see if medicaiton had been called in yet.  Pt states she is going to be out 06/28/17.    Pt wants to see if it could be faxed or called into [facility] in [place]

## 2017-06-27 ENCOUNTER — Other Ambulatory Visit: Payer: Self-pay

## 2017-06-27 DIAGNOSIS — Z1231 Encounter for screening mammogram for malignant neoplasm of breast: Secondary | ICD-10-CM

## 2017-06-27 MED ORDER — ALPRAZOLAM 0.5 MG PO TABS: 0.5000 mg | ORAL_TABLET | Freq: Every day | ORAL | 1 refills | Status: DC

## 2017-06-27 NOTE — Telephone Encounter (Signed)
RX called in to [facility] in [place] as per patient's request-Donita Newland V Talon Regala, RMA

## 2017-07-02 ENCOUNTER — Telehealth: Payer: Self-pay | Admitting: *Deleted

## 2017-07-02 ENCOUNTER — Encounter: Payer: Self-pay | Admitting: *Deleted

## 2017-07-02 NOTE — Telephone Encounter (Signed)
Patient called concerning about her balance that is still out to insurance as of 11/07/16. She stated that she had [facility] Medicare at the time and they are saying that the code that was sent in was denied. She wants to know is there a different code that can be used to fix this?

## 2017-07-13 ENCOUNTER — Other Ambulatory Visit: Payer: Self-pay | Admitting: Family Medicine

## 2017-07-16 ENCOUNTER — Ambulatory Visit
Admission: RE | Admit: 2017-07-16 | Discharge: 2017-07-16 | Disposition: A | Discharge status: Home / Self Care | Payer: Medicare HMO | Source: Ambulatory Visit | Attending: Family Medicine | Admitting: Family Medicine

## 2017-07-16 DIAGNOSIS — M810 Age-related osteoporosis without current pathological fracture: Secondary | ICD-10-CM

## 2017-07-16 DIAGNOSIS — Z780 Asymptomatic menopausal state: Secondary | ICD-10-CM | POA: Diagnosis not present

## 2017-07-16 DIAGNOSIS — M8589 Other specified disorders of bone density and structure, multiple sites: Secondary | ICD-10-CM | POA: Diagnosis not present

## 2017-07-18 ENCOUNTER — Telehealth: Payer: Self-pay

## 2017-07-18 NOTE — Telephone Encounter (Signed)
-----   Message from Jerrol Banana., MD sent at 07/18/2017  3:42 PM EST ----- Borderline osteoporosis--how long hass pt been on alendronate--if less than 5 years continue . Repeat BMD 2 years.

## 2017-07-18 NOTE — Telephone Encounter (Signed)
Patient states she has been on Fosamax for many years and would like to stay on it if that is ok   ----- Message from Jerrol Banana., MD sent at 07/18/2017  3:42 PM EST ----- Borderline osteoporosis--how long hass pt been on alendronate--if less than 5 years continue . Repeat BMD 2 years.

## 2017-07-19 NOTE — Telephone Encounter (Signed)
Advised  ED 

## 2017-07-19 NOTE — Telephone Encounter (Signed)
If more than 5 need to stop. Would still repeat BMD 2 years. Can refer to Endocrine for other options. Can discuss on next OV here.

## 2017-08-13 ENCOUNTER — Ambulatory Visit
Admission: RE | Admit: 2017-08-13 | Discharge: 2017-08-13 | Disposition: A | Discharge status: Home / Self Care | Payer: Medicare HMO | Source: Ambulatory Visit | Attending: General Surgery | Admitting: General Surgery

## 2017-08-13 ENCOUNTER — Other Ambulatory Visit: Payer: Self-pay | Admitting: General Surgery

## 2017-08-13 DIAGNOSIS — Z1231 Encounter for screening mammogram for malignant neoplasm of breast: Secondary | ICD-10-CM | POA: Diagnosis not present

## 2017-08-21 ENCOUNTER — Ambulatory Visit: Payer: Medicare HMO | Admitting: General Surgery

## 2017-08-21 VITALS — BP 124/74 | HR 72 | Resp 12 | Ht 64.0 in | Wt 130.0 lb

## 2017-08-21 DIAGNOSIS — C50411 Malignant neoplasm of upper-outer quadrant of right female breast: Secondary | ICD-10-CM

## 2017-08-21 DIAGNOSIS — Z170 Estrogen receptor positive status [ER+]: Secondary | ICD-10-CM | POA: Diagnosis not present

## 2017-08-21 MED ORDER — METHOCARBAMOL 500 MG PO TABS: 500.0000 mg | ORAL_TABLET | Freq: Three times a day (TID) | ORAL | 0 refills | Status: DC

## 2017-08-21 NOTE — Progress Notes (Signed)
Patient ID: Sandra [name], female   DOB: 09-16-1945, 72 y.o.   MRN: [id number]  Chief Complaint  Patient presents with  . Follow-up    HPI Sandra [name] is a 72 y.o. female who presents for a breast evaluation. The most recent left breast mammogram was done on 08/13/2017.  Patient does perform regular self breast checks and gets regular mammograms done. Patient states she has a sharpe pain at right mastectomy site, the pain only last for seconds. Started two months ago.   HPI  Past Medical History:  Diagnosis Date  . Abnormality of left ventricle of heart    MILD-MODERATE THICKENING PER ECHO ON 01-18-17  . Anemia   . Anxiety   . Arthritis   . BRCA gene mutation negative 10/2016   [facility]  . Breast cancer ([place]) 10/16/2016   T1c,N0;, GRADE I/III, 1.6cm. ER/PR pos  HER2 not over expressed, Right Upper Outer  . Celiac disease   . Depression   . Diastolic dysfunction   . Dyspnea    WITH EXERTION  . GERD (gastroesophageal reflux disease)    OCC  . Glaucoma    right eye  . Headache    H/O MIGRAINES AS TEENAGER  . Heart murmur    PT WAS TOLD ONCE SHE HAD MVP AND THEN ANOTHER MD SAID SHE DID NOT HAVE MVP  . Hypothyroidism   . Pneumonia    YEARS AGO  . Raynaud's disease   . RLS (restless legs syndrome)     Past Surgical History:  Procedure Laterality Date  . ABDOMINAL HYSTERECTOMY    . APPENDECTOMY    . AXILLARY LYMPH NODE BIOPSY Right 03/05/2017   Procedure: AXILLARY LYMPH NODE BIOPSY;  Surgeon: Robert Bellow, MD;  Location: [facility] ORS;  Service: General;  Laterality: Right;  . CATARACT EXTRACTION Bilateral   . CESAREAN SECTION    . COLONOSCOPY WITH PROPOFOL N/A 05/19/2015   Procedure: COLONOSCOPY WITH PROPOFOL;  Surgeon: Manya Silvas, MD;  Location: [facility] ENDOSCOPY;  Service: Endoscopy;  Laterality: N/A;  . EYE SURGERY    . HAND SURGERY Left   . MASTECTOMY Right 11/07/2016   RESIDUAL INVASIVE MAMMARY CARCINOMA, SUBAREOLAR ANTERIOR TO PREVIOUS   . SENTINEL NODE  BIOPSY Right 11/07/2016   Procedure: SENTINEL NODE BIOPSY;  Surgeon: Robert Bellow, MD;  Location: [facility] ORS;  Service: General;  Laterality: Right;  . SIMPLE MASTECTOMY WITH AXILLARY SENTINEL NODE BIOPSY Right 11/07/2016   Procedure: SIMPLE MASTECTOMY;  Surgeon: Robert Bellow, MD;  Location: [facility] ORS;  Service: General;  Laterality: Right;  . TONSILLECTOMY  AGE 66  . UPPER GI ENDOSCOPY  01/12/06   hiatus hernia    Family History  Problem Relation Age of Onset  . Cancer Mother   . Anemia Mother   . Other Mother        lymphosarcoma  . Alcohol abuse Father   . Prostate cancer Father   . Breast cancer Sister        69; 1/2 sister, shared mother.  . Stroke Maternal Grandmother   . Breast cancer Maternal Grandmother 68  . Heart attack Maternal Grandfather   . CAD Paternal Grandfather     Social History Social History   Tobacco Use  . Smoking status: Never Smoker  . Smokeless tobacco: Never Used  Substance Use Topics  . Alcohol use: No  . Drug use: No    Allergies  Allergen Reactions  . Benadryl [Diphenhydramine Hcl (Sleep)] Other (See Comments)  Hyperactivity/causes opposite effect  . Advil [Ibuprofen] Rash    CAN TAKE NAPROSYN  . Claritin [Loratadine] Rash    Current Outpatient Medications  Medication Sig Dispense Refill  . acetaminophen (TYLENOL) 500 MG tablet Take 500 mg by mouth every 6 (six) hours as needed for mild pain, moderate pain or headache.    . ALPRAZolam (XANAX) 0.5 MG tablet Take 1 tablet (0.5 mg total) by mouth at bedtime. 90 tablet 1  . Calcium Carbonate (CALCIUM 600 PO) Take 600 mg by mouth daily at 3 pm.     . Cholecalciferol (VITAMIN D3) 10000 units TABS Take 1,000 Units by mouth daily at 3 pm.     . hydroxypropyl methylcellulose / hypromellose (ISOPTO TEARS / GONIOVISC) 2.5 % ophthalmic solution Place 1 drop into both eyes 4 (four) times daily as needed for dry eyes (scheduled each morning & then as needed).    . latanoprost (XALATAN)  0.005 % ophthalmic solution INT 1 GTT IN OD 1 TIME IN THE EVE  5  . letrozole (FEMARA) 2.5 MG tablet Take 1 tablet (2.5 mg total) by mouth daily. 30 tablet 2  . levothyroxine (SYNTHROID, LEVOTHROID) 100 MCG tablet TAKE 1 TABLET BY MOUTH  DAILY (Patient taking differently: TAKE 1 TABLET (100 MCG) BY MOUTH  BEFORE BREAKFAST *30 MINS PRIOR TO BREAKFAST*) 90 tablet 3  . Multiple Vitamin (MULTIVITAMIN WITH MINERALS) TABS tablet Take 1 tablet by mouth daily at 3 pm.    . Omega-3 Fatty Acids (FISH OIL PO) Take 1 capsule by mouth daily.     [name] omeprazole (PRILOSEC) 20 MG capsule Take 1 capsule (20 mg total) by mouth daily. 90 capsule 3  . Psyllium (EQ DAILY FIBER PO) Take 1 tablet by mouth at bedtime. Takes with align probiotic.    [name] psyllium (METAMUCIL) 58.6 % powder Add to 8oz of water once daily    . senna (SENOKOT) 8.6 MG TABS tablet Take 1 tablet by mouth daily as needed (for constipation).    . sertraline (ZOLOFT) 100 MG tablet TAKE 1 TABLET BY MOUTH  DAILY 90 tablet 3  . Simethicone (GAS-X PO) Take 1 tablet by mouth as needed.    [name] amLODipine (NORVASC) 5 MG tablet Take 1 tablet (5 mg total) by mouth daily. 30 tablet 6  . carvedilol (COREG) 3.125 MG tablet Take 1 tablet (3.125 mg total) by mouth 2 (two) times daily. 180 tablet 3  . methocarbamol (ROBAXIN) 500 MG tablet Take 1 tablet (500 mg total) by mouth 3 (three) times daily. 30 tablet 0   No current facility-administered medications for this visit.     Review of Systems Review of Systems  Constitutional: Negative.   Respiratory: Negative.   Cardiovascular: Negative.     Blood pressure 124/74, pulse 72, resp. rate 12, height 5' 4"  (1.626 m), weight 130 lb (59 kg).  Physical Exam Physical Exam  Constitutional: She is oriented to person, place, and time. She appears well-developed and well-nourished.  Eyes: Conjunctivae are normal. No scleral icterus.  Neck: Neck supple.  Cardiovascular: Normal rate, regular rhythm and normal heart  sounds.  Pulmonary/Chest: Effort normal and breath sounds normal. Left breast exhibits no inverted nipple, no mass, no nipple discharge, no skin change and no tenderness.    Right mastectomy site well healed. Tenderness in right axilla.Tenderness along the left chest wall.   Lymphadenopathy:    She has no cervical adenopathy.    She has no axillary adenopathy.  Neurological: She is alert and oriented to  person, place, and time.  Skin: Skin is warm and dry.       Assessment    No evidence of recurrent cancer.   Trial of Robaxin recommended.     Plan  Rx sent to pharmacy. Report in 10 days.  The patient is aware to use a heating pad as needed for comfort. Return in six months breast check.    The patient is aware to call back for any questions or concerns.   HPI, Physical Exam, Assessment and Plan have been scribed under the direction and in the presence of Hervey Ard, MD.  Gaspar Cola, CMA  I have completed the exam and reviewed the above documentation for accuracy and completeness.  I agree with the above.  Haematologist has been used and any errors in dictation or transcription are unintentional.  Hervey Ard, M.D., F.A.C.S.  Forest Gleason Swan Zayed 08/22/2017, 8:00 PM

## 2017-08-21 NOTE — Patient Instructions (Signed)
  Rx sent to pharmacy. Report in 10 days.  The patient is aware to use a heating pad as needed for comfort. Return in six months breast check.

## 2017-08-28 ENCOUNTER — Telehealth: Payer: Self-pay | Admitting: *Deleted

## 2017-08-28 NOTE — Telephone Encounter (Signed)
I called to see how many tablets she received of the Methocarbam 5 mg tablet. She states she is having eye pain and she called the pharmacist to see if it was a side effects and they told her no. She states she is still having breast pain and seems to think it is more often since starting the trial medication. Dr Bary Castilla aware and instructed pt to stop taking the medications, monitor to see if the eye pain improves. She states she will tolerate the breast pain and follow up as scheduled. The patient is aware to call back sooner for any new concerns.

## 2017-09-10 DIAGNOSIS — H401111 Primary open-angle glaucoma, right eye, mild stage: Secondary | ICD-10-CM | POA: Diagnosis not present

## 2017-09-26 ENCOUNTER — Other Ambulatory Visit: Payer: Self-pay | Admitting: Family Medicine

## 2017-09-26 ENCOUNTER — Ambulatory Visit (INDEPENDENT_AMBULATORY_CARE_PROVIDER_SITE_OTHER): Payer: Medicare HMO | Admitting: Family Medicine

## 2017-09-26 VITALS — BP 142/80 | HR 72 | Temp 98.0°F | Resp 16 | Wt 133.0 lb

## 2017-09-26 DIAGNOSIS — R1084 Generalized abdominal pain: Secondary | ICD-10-CM

## 2017-09-26 MED ORDER — HYOSCYAMINE SULFATE SL 0.125 MG SL SUBL: 1.0000 | SUBLINGUAL_TABLET | Freq: Four times a day (QID) | SUBLINGUAL | 0 refills | Status: DC | PRN

## 2017-09-26 MED ORDER — OMEPRAZOLE 20 MG PO CPDR: 20.0000 mg | DELAYED_RELEASE_CAPSULE | Freq: Two times a day (BID) | ORAL | 3 refills | Status: DC

## 2017-09-26 NOTE — Progress Notes (Signed)
Sandra [name]  MRN: [id number] DOB: [birth year]  Subjective:  HPI   The patient is a 72 year old female who presents today for evaluation of abdominal pain.  She states she has had abdominal pain, constipation and at time abdominal distention for several weeks.  The last 2 days she has had severe diarrhea.   She has an appointment with GI on 10/08/17 but did not want to wait until then.  She has in the past been diagnosed with celiac disease.  She has abdominal pain after eating daily.   She has history of hysterectomy age 84 but thinks she still has her ovaries.   She was diagnosed with breast cancer last year and had mastectomy then was placed on Letrozole.  Patient Active Problem List   Diagnosis Date Noted  . Essential hypertension 05/17/2017  . Glaucoma (increased eye pressure) 02/06/2017  . Invasive ductal carcinoma of breast, female, right ([place]) 10/20/2016  . Prolapse of vaginal vault after hysterectomy 08/24/2016  . Vaginal atrophy 08/24/2016  . Dyspareunia in female 08/24/2016  . Colon polyps 01/06/2015  . BCC (basal cell carcinoma of skin) 01/06/2015  . Hypothyroidism 01/06/2015  . Vitamin D deficiency 01/06/2015  . Hyperlipidemia 01/06/2015  . Depression, major 01/06/2015  . GAD (generalized anxiety disorder) 01/06/2015  . RLS (restless legs syndrome) 01/06/2015  . Cataract 01/06/2015  . Raynaud phenomenon 01/06/2015  . GERD (gastroesophageal reflux disease) 01/06/2015  . Celiac disease 01/06/2015  . OA (osteoarthritis) 01/06/2015  . Osteoporosis 01/06/2015  . Pre-diabetes 01/06/2015  . Avitaminosis D 01/06/2015  . Raynaud's syndrome without gangrene 01/06/2015  . Age-related osteoporosis without current pathological fracture 01/06/2015  . Anxiety disorder 01/05/2015    Past Medical History:  Diagnosis Date  . Abnormality of left ventricle of heart    MILD-MODERATE THICKENING PER ECHO ON 01-18-17  . Anemia   . Anxiety   . Arthritis   . BRCA gene mutation  negative 10/2016   [facility]  . Breast cancer ([place]) 10/16/2016   T1c,N0;, GRADE I/III, 1.6cm. ER/PR pos  HER2 not over expressed, Right Upper Outer  . Celiac disease   . Depression   . Diastolic dysfunction   . Dyspnea    WITH EXERTION  . GERD (gastroesophageal reflux disease)    OCC  . Glaucoma    right eye  . Headache    H/O MIGRAINES AS TEENAGER  . Heart murmur    PT WAS TOLD ONCE SHE HAD MVP AND THEN ANOTHER MD SAID SHE DID NOT HAVE MVP  . Hypothyroidism   . Pneumonia    YEARS AGO  . Raynaud's disease   . RLS (restless legs syndrome)     Social History   Socioeconomic History  . Marital status: Married    Spouse name: Not on file  . Number of children: Not on file  . Years of education: Not on file  . Highest education level: Not on file  Occupational History  . Not on file  Social Needs  . Financial resource strain: Not on file  . Food insecurity:    Worry: Not on file    Inability: Not on file  . Transportation needs:    Medical: Not on file    Non-medical: Not on file  Tobacco Use  . Smoking status: Never Smoker  . Smokeless tobacco: Never Used  Substance and Sexual Activity  . Alcohol use: No  . Drug use: No  . Sexual activity: Not Currently    Birth control/protection:  None  Lifestyle  . Physical activity:    Days per week: Not on file    Minutes per session: Not on file  . Stress: Not on file  Relationships  . Social connections:    Talks on phone: Not on file    Gets together: Not on file    Attends religious service: Not on file    Active member of club or organization: Not on file    Attends meetings of clubs or organizations: Not on file    Relationship status: Not on file  . Intimate partner violence:    Fear of current or ex partner: Not on file    Emotionally abused: Not on file    Physically abused: Not on file    Forced sexual activity: Not on file  Other Topics Concern  . Not on file  Social History Narrative  . Not on file     Outpatient Encounter Medications as of 09/26/2017  Medication Sig  . acetaminophen (TYLENOL) 500 MG tablet Take 500 mg by mouth every 6 (six) hours as needed for mild pain, moderate pain or headache.  . ALPRAZolam (XANAX) 0.5 MG tablet Take 1 tablet (0.5 mg total) by mouth at bedtime.  . Calcium Carbonate (CALCIUM 600 PO) Take 600 mg by mouth daily at 3 pm.   . Cholecalciferol (VITAMIN D3) 10000 units TABS Take 1,000 Units by mouth daily at 3 pm.   . hydroxypropyl methylcellulose / hypromellose (ISOPTO TEARS / GONIOVISC) 2.5 % ophthalmic solution Place 1 drop into both eyes 4 (four) times daily as needed for dry eyes (scheduled each morning & then as needed).  . latanoprost (XALATAN) 0.005 % ophthalmic solution INT 1 GTT IN OD 1 TIME IN THE EVE  . letrozole (FEMARA) 2.5 MG tablet Take 1 tablet (2.5 mg total) by mouth daily.  [name] levothyroxine (SYNTHROID, LEVOTHROID) 100 MCG tablet TAKE 1 TABLET BY MOUTH  DAILY (Patient taking differently: TAKE 1 TABLET (100 MCG) BY MOUTH  BEFORE BREAKFAST *30 MINS PRIOR TO BREAKFAST*)  . Multiple Vitamin (MULTIVITAMIN WITH MINERALS) TABS tablet Take 1 tablet by mouth daily at 3 pm.  . Omega-3 Fatty Acids (FISH OIL PO) Take 1 capsule by mouth daily.   [name] omeprazole (PRILOSEC) 20 MG capsule Take 1 capsule (20 mg total) by mouth daily.  . sertraline (ZOLOFT) 100 MG tablet TAKE 1 TABLET BY MOUTH  DAILY  . Simethicone (GAS-X PO) Take 1 tablet by mouth as needed.  [name] amLODipine (NORVASC) 5 MG tablet Take 1 tablet (5 mg total) by mouth daily.  . carvedilol (COREG) 3.125 MG tablet Take 1 tablet (3.125 mg total) by mouth 2 (two) times daily.  . [DISCONTINUED] methocarbamol (ROBAXIN) 500 MG tablet Take 1 tablet (500 mg total) by mouth 3 (three) times daily.  . [DISCONTINUED] Psyllium (EQ DAILY FIBER PO) Take 1 tablet by mouth at bedtime. Takes with align probiotic.  . [DISCONTINUED] psyllium (METAMUCIL) 58.6 % powder Add to 8oz of water once daily  . [DISCONTINUED] senna  (SENOKOT) 8.6 MG TABS tablet Take 1 tablet by mouth daily as needed (for constipation).   No facility-administered encounter medications on file as of 09/26/2017.     Allergies  Allergen Reactions  . Benadryl [Diphenhydramine Hcl (Sleep)] Other (See Comments)    Hyperactivity/causes opposite effect  . Advil [Ibuprofen] Rash    CAN TAKE NAPROSYN  . Claritin [Loratadine] Rash    Review of Systems  Constitutional: Positive for chills and malaise/fatigue. Negative for fever.  Eyes:  Negative.   Respiratory: Negative for cough, shortness of breath and wheezing.   Cardiovascular: Negative for chest pain, palpitations, orthopnea and leg swelling.  Gastrointestinal: Positive for abdominal pain, constipation and diarrhea. Negative for blood in stool, heartburn, melena, nausea and vomiting.  Skin: Negative.   Neurological: Negative.   Endo/Heme/Allergies: Negative.   Psychiatric/Behavioral: Negative.     Objective:  BP (!) 142/80 (BP Location: Right Arm, Patient Position: Sitting, Cuff Size: Normal)   Pulse 72   Temp 98 F (36.7 C) (Oral)   Resp 16   Wt 133 lb (60.3 kg)   BMI 22.83 kg/m   Physical Exam  Constitutional: She is oriented to person, place, and time and well-developed, well-nourished, and in no distress.  HENT:  Head: Normocephalic and atraumatic.  Eyes: Conjunctivae are normal.  Neck: No thyromegaly present.  Cardiovascular: Normal rate, regular rhythm and normal heart sounds.  Pulmonary/Chest: Effort normal and breath sounds normal.  Abdominal: Soft.  Neurological: She is alert and oriented to person, place, and time. Gait normal. GCS score is 15.  Skin: Skin is warm and dry.  Psychiatric: Mood, memory, affect and judgment normal.    Assessment and Plan :   - omeprazole (PRILOSEC) 20 MG capsule; Take 1 capsule (20 mg total) by mouth 2 (two) times daily before a meal.  Dispense: 180 capsule; Refill: 3  1. Generalized abdominal pain Bloating Pt feels a little  better. - CBC with Differential/Platelet - Comprehensive metabolic panel - Lipase - US Abdomen Complete; Future GI consult later this month if not improving. - omeprazole (PRILOSEC) 20 MG capsule; Take 1 capsule (20 mg total) by mouth 2 (two) times daily before a meal.  Dispense: 180 capsule; Refill: 3 Bimanual exam next visit if persistent .   I have done the exam and reviewed the chart and it is accurate to the best of my knowledge. Development worker, community has been used and  any errors in dictation or transcription are unintentional. [name] M.D. [facility]

## 2017-09-26 NOTE — Patient Instructions (Signed)
Stop all dairy products.

## 2017-09-27 LAB — CBC WITH DIFFERENTIAL/PLATELET
Basophils Absolute: 0 10*3/uL (ref 0.0–0.2)
Basos: 0 %
EOS (ABSOLUTE): 0 10*3/uL (ref 0.0–0.4)
Eos: 1 %
Hematocrit: 34.3 % (ref 34.0–46.6)
Hemoglobin: 11.4 g/dL (ref 11.1–15.9)
Immature Grans (Abs): 0 10*3/uL (ref 0.0–0.1)
Immature Granulocytes: 0 %
Lymphocytes Absolute: 1.4 10*3/uL (ref 0.7–3.1)
Lymphs: 31 %
MCH: 29.2 pg (ref 26.6–33.0)
MCHC: 33.2 g/dL (ref 31.5–35.7)
MCV: 88 fL (ref 79–97)
Monocytes Absolute: 0.6 10*3/uL (ref 0.1–0.9)
Monocytes: 14 %
Neutrophils Absolute: 2.5 10*3/uL (ref 1.4–7.0)
Neutrophils: 54 %
Platelets: 237 10*3/uL (ref 150–379)
RBC: 3.9 x10E6/uL (ref 3.77–5.28)
RDW: 14.1 % (ref 12.3–15.4)
WBC: 4.6 10*3/uL (ref 3.4–10.8)

## 2017-09-27 LAB — COMPREHENSIVE METABOLIC PANEL
ALT: 41 IU/L — ABNORMAL HIGH (ref 0–32)
AST: 27 IU/L (ref 0–40)
Albumin/Globulin Ratio: 1.7 (ref 1.2–2.2)
Albumin: 4 g/dL (ref 3.5–4.8)
Alkaline Phosphatase: 64 IU/L (ref 39–117)
BUN/Creatinine Ratio: 15 (ref 12–28)
BUN: 9 mg/dL (ref 8–27)
Bilirubin Total: 0.4 mg/dL (ref 0.0–1.2)
CO2: 25 mmol/L (ref 20–29)
Calcium: 9.1 mg/dL (ref 8.7–10.3)
Chloride: 104 mmol/L (ref 96–106)
Creatinine, Ser: 0.6 mg/dL (ref 0.57–1.00)
GFR calc Af Amer: 105 mL/min/{1.73_m2} (ref >59)
GFR calc non Af Amer: 91 mL/min/{1.73_m2} (ref >59)
Globulin, Total: 2.3 g/dL (ref 1.5–4.5)
Glucose: 102 mg/dL — ABNORMAL HIGH (ref 65–99)
Potassium: 4 mmol/L (ref 3.5–5.2)
Sodium: 141 mmol/L (ref 134–144)
Total Protein: 6.3 g/dL (ref 6.0–8.5)

## 2017-09-27 LAB — LIPASE: Lipase: 55 U/L (ref 14–85)

## 2017-09-28 NOTE — Telephone Encounter (Signed)
Please verify directions for the pharmacy. Thanks!

## 2017-10-02 ENCOUNTER — Telehealth: Payer: Self-pay

## 2017-10-02 ENCOUNTER — Other Ambulatory Visit: Payer: Self-pay

## 2017-10-02 ENCOUNTER — Ambulatory Visit (INDEPENDENT_AMBULATORY_CARE_PROVIDER_SITE_OTHER): Payer: Medicare HMO | Admitting: Family Medicine

## 2017-10-02 VITALS — BP 120/72 | HR 70 | Temp 98.1°F | Resp 16 | Wt 121.0 lb

## 2017-10-02 DIAGNOSIS — K900 Celiac disease: Secondary | ICD-10-CM

## 2017-10-02 DIAGNOSIS — C50911 Malignant neoplasm of unspecified site of right female breast: Secondary | ICD-10-CM | POA: Diagnosis not present

## 2017-10-02 DIAGNOSIS — R69 Illness, unspecified: Secondary | ICD-10-CM | POA: Diagnosis not present

## 2017-10-02 DIAGNOSIS — F411 Generalized anxiety disorder: Secondary | ICD-10-CM

## 2017-10-02 DIAGNOSIS — R1084 Generalized abdominal pain: Secondary | ICD-10-CM

## 2017-10-02 MED ORDER — DICYCLOMINE HCL 10 MG PO CAPS: 10.0000 mg | ORAL_CAPSULE | Freq: Three times a day (TID) | ORAL | 5 refills | Status: DC

## 2017-10-02 NOTE — Telephone Encounter (Signed)
-----   Message from Jerrol Banana., MD sent at 09/28/2017 10:10 AM EDT ----- Labs ok.

## 2017-10-02 NOTE — Progress Notes (Signed)
Sandra [name]  MRN: [id number] DOB: 1945/08/04  Subjective:  HPI   The patient is a 72 year old female who presents today for follow up of her abdominal pain.  She was last seen on 09/26/17.  At that time she was complaining of chills, fatigue, abdominal pain with constipation and diarrhea.   She was advised to take Hyoscyamine, increase Omeprazole to twice daily, stop all dairy products to see if this may be a lactose intolerance and she was scheduled for abdominal US and has that appointment on 10/04/17.  The patient went to get the medicine and her insurance did not cover it so it was changed to Bentyl.  She has not started it yet as the pharmacy just notified her that it was ready.  She has stopped the dairy products but not completely.  She thinks this has helped a little.  She is still having the abdominal pain.  She also relates that after eating at the [place] (she only had chicken and rice with cheese) she had lower abdominal pain, not heartburn.      Patient Active Problem List   Diagnosis Date Noted  . Essential hypertension 05/17/2017  . Glaucoma (increased eye pressure) 02/06/2017  . Invasive ductal carcinoma of breast, female, right ([place]) 10/20/2016  . Prolapse of vaginal vault after hysterectomy 08/24/2016  . Vaginal atrophy 08/24/2016  . Dyspareunia in female 08/24/2016  . Colon polyps 01/06/2015  . BCC (basal cell carcinoma of skin) 01/06/2015  . Hypothyroidism 01/06/2015  . Vitamin D deficiency 01/06/2015  . Hyperlipidemia 01/06/2015  . Depression, major 01/06/2015  . GAD (generalized anxiety disorder) 01/06/2015  . RLS (restless legs syndrome) 01/06/2015  . Cataract 01/06/2015  . Raynaud phenomenon 01/06/2015  . GERD (gastroesophageal reflux disease) 01/06/2015  . Celiac disease 01/06/2015  . OA (osteoarthritis) 01/06/2015  . Osteoporosis 01/06/2015  . Pre-diabetes 01/06/2015  . Avitaminosis D 01/06/2015  . Raynaud's syndrome without gangrene  01/06/2015  . Age-related osteoporosis without current pathological fracture 01/06/2015  . Anxiety disorder 01/05/2015    Past Medical History:  Diagnosis Date  . Abnormality of left ventricle of heart    MILD-MODERATE THICKENING PER ECHO ON 01-18-17  . Anemia   . Anxiety   . Arthritis   . BRCA gene mutation negative 10/2016   [facility]  . Breast cancer ([place]) 10/16/2016   T1c,N0;, GRADE I/III, 1.6cm. ER/PR pos  HER2 not over expressed, Right Upper Outer  . Celiac disease   . Depression   . Diastolic dysfunction   . Dyspnea    WITH EXERTION  . GERD (gastroesophageal reflux disease)    OCC  . Glaucoma    right eye  . Headache    H/O MIGRAINES AS TEENAGER  . Heart murmur    PT WAS TOLD ONCE SHE HAD MVP AND THEN ANOTHER MD SAID SHE DID NOT HAVE MVP  . Hypothyroidism   . Pneumonia    YEARS AGO  . Raynaud's disease   . RLS (restless legs syndrome)     Social History   Socioeconomic History  . Marital status: Married    Spouse name: Not on file  . Number of children: Not on file  . Years of education: Not on file  . Highest education level: Not on file  Occupational History  . Not on file  Social Needs  . Financial resource strain: Not on file  . Food insecurity:    Worry: Not on file    Inability: Not on  file  . Transportation needs:    Medical: Not on file    Non-medical: Not on file  Tobacco Use  . Smoking status: Never Smoker  . Smokeless tobacco: Never Used  Substance and Sexual Activity  . Alcohol use: No  . Drug use: No  . Sexual activity: Not Currently    Birth control/protection: None  Lifestyle  . Physical activity:    Days per week: Not on file    Minutes per session: Not on file  . Stress: Not on file  Relationships  . Social connections:    Talks on phone: Not on file    Gets together: Not on file    Attends religious service: Not on file    Active member of club or organization: Not on file    Attends meetings of clubs or organizations: Not  on file    Relationship status: Not on file  . Intimate partner violence:    Fear of current or ex partner: Not on file    Emotionally abused: Not on file    Physically abused: Not on file    Forced sexual activity: Not on file  Other Topics Concern  . Not on file  Social History Narrative  . Not on file    Outpatient Encounter Medications as of 10/02/2017  Medication Sig  . acetaminophen (TYLENOL) 500 MG tablet Take 500 mg by mouth every 6 (six) hours as needed for mild pain, moderate pain or headache.  . ALPRAZolam (XANAX) 0.5 MG tablet Take 1 tablet (0.5 mg total) by mouth at bedtime.  . Calcium Carbonate (CALCIUM 600 PO) Take 600 mg by mouth daily at 3 pm.   . Cholecalciferol (VITAMIN D3) 10000 units TABS Take 1,000 Units by mouth daily at 3 pm.   . dicyclomine (BENTYL) 10 MG capsule Take 1 capsule (10 mg total) by mouth 3 (three) times daily before meals.  . hydroxypropyl methylcellulose / hypromellose (ISOPTO TEARS / GONIOVISC) 2.5 % ophthalmic solution Place 1 drop into both eyes 4 (four) times daily as needed for dry eyes (scheduled each morning & then as needed).  . latanoprost (XALATAN) 0.005 % ophthalmic solution INT 1 GTT IN OD 1 TIME IN THE EVE  . letrozole (FEMARA) 2.5 MG tablet Take 1 tablet (2.5 mg total) by mouth daily.  [name] levothyroxine (SYNTHROID, LEVOTHROID) 100 MCG tablet TAKE 1 TABLET BY MOUTH  DAILY (Patient taking differently: TAKE 1 TABLET (100 MCG) BY MOUTH  BEFORE BREAKFAST *30 MINS PRIOR TO BREAKFAST*)  . Multiple Vitamin (MULTIVITAMIN WITH MINERALS) TABS tablet Take 1 tablet by mouth daily at 3 pm.  . Omega-3 Fatty Acids (FISH OIL PO) Take 1 capsule by mouth daily.   [name] omeprazole (PRILOSEC) 20 MG capsule Take 1 capsule (20 mg total) by mouth 2 (two) times daily before a meal.  . sertraline (ZOLOFT) 100 MG tablet TAKE 1 TABLET BY MOUTH  DAILY  . Simethicone (GAS-X PO) Take 1 tablet by mouth as needed.  [name] amLODipine (NORVASC) 5 MG tablet Take 1 tablet (5 mg total)  by mouth daily.  . carvedilol (COREG) 3.125 MG tablet Take 1 tablet (3.125 mg total) by mouth 2 (two) times daily.  . [DISCONTINUED] dicyclomine (BENTYL) 10 MG capsule Take 1 capsule (10 mg total) by mouth 3 (three) times daily before meals.   No facility-administered encounter medications on file as of 10/02/2017.     Allergies  Allergen Reactions  . Benadryl [Diphenhydramine Hcl (Sleep)] Other (See Comments)    Hyperactivity/causes  opposite effect  . Advil [Ibuprofen] Rash    CAN TAKE NAPROSYN  . Claritin [Loratadine] Rash    Review of Systems  Constitutional: Positive for malaise/fatigue. Negative for fever.  Eyes: Negative.   Respiratory: Negative for cough, shortness of breath and wheezing.   Cardiovascular: Negative for chest pain and palpitations.  Gastrointestinal: Positive for abdominal pain, constipation and diarrhea. Negative for blood in stool, heartburn, nausea and vomiting.  Endo/Heme/Allergies: Negative.   Psychiatric/Behavioral: Negative.     Objective:  BP 120/72 (BP Location: Right Arm, Patient Position: Sitting, Cuff Size: Normal)   Pulse 70   Temp 98.1 F (36.7 C) (Oral)   Resp 16   Wt 121 lb (54.9 kg)   BMI 20.77 kg/m   Physical Exam  Constitutional: She is oriented to person, place, and time and well-developed, well-nourished, and in no distress.  HENT:  Head: Normocephalic and atraumatic.  Eyes: Conjunctivae are normal.  Cardiovascular: Normal rate, regular rhythm and normal heart sounds.  Pulmonary/Chest: Effort normal.  Abdominal: Soft. She exhibits no mass. There is no tenderness. There is no guarding.  Musculoskeletal: She exhibits no edema.  Neurological: She is alert and oriented to person, place, and time. Gait normal. GCS score is 15.  Skin: Skin is warm and dry.  Psychiatric: Mood, memory, affect and judgment normal.    Assessment and Plan :  Abdominal Pain/Bloating US/GI consult pending.May need CT scan on f/u. Breast Cancer GAD  I  have done the exam and reviewed the chart and it is accurate to the best of my knowledge. Development worker, community has been used and  any errors in dictation or transcription are unintentional. [name] M.D. [facility]

## 2017-10-02 NOTE — Telephone Encounter (Signed)
Patient has appointment today, will give result at that time   ----- Message from Jerrol Banana., MD sent at 09/28/2017 10:10 AM EDT ----- Labs ok.

## 2017-10-03 ENCOUNTER — Ambulatory Visit: Payer: Self-pay | Admitting: Family Medicine

## 2017-10-04 ENCOUNTER — Ambulatory Visit
Admission: RE | Admit: 2017-10-04 | Discharge: 2017-10-04 | Disposition: A | Discharge status: Home / Self Care | Payer: Medicare HMO | Source: Ambulatory Visit | Attending: Family Medicine | Admitting: Family Medicine

## 2017-10-04 DIAGNOSIS — R1084 Generalized abdominal pain: Secondary | ICD-10-CM | POA: Insufficient documentation

## 2017-10-05 ENCOUNTER — Telehealth: Payer: Self-pay | Admitting: Family Medicine

## 2017-10-05 NOTE — Telephone Encounter (Signed)
Pt had an [place] (that Dr. Rosanna Randy ordered yesterday) and has seen her results on MY Chart and would like someone to call her back and explain futher about the results.   She also has an appt on Monday that she scheduled with a GI dr.  She wants to know if she should keep that appt.  Pt' scall back is [phone]  Thanks Con Memos

## 2017-10-05 NOTE — Telephone Encounter (Signed)
Pt advised and agrees to FU with GI.

## 2017-10-05 NOTE — Telephone Encounter (Signed)
Reviewed [place].  Liver may have some fatty changes.  Further imaging may be necessary.  Would see GI as scheduled and can discuss further with them.  Virginia Crews, MD, MPH [facility] 10/05/2017 9:55 AM

## 2017-10-05 NOTE — Telephone Encounter (Signed)
Please review

## 2017-10-08 ENCOUNTER — Other Ambulatory Visit: Payer: Self-pay | Admitting: Student

## 2017-10-08 DIAGNOSIS — R740 Nonspecific elevation of levels of transaminase and lactic acid dehydrogenase [LDH]: Secondary | ICD-10-CM | POA: Diagnosis not present

## 2017-10-08 DIAGNOSIS — R7401 Elevation of levels of liver transaminase levels: Secondary | ICD-10-CM

## 2017-10-08 DIAGNOSIS — R1310 Dysphagia, unspecified: Secondary | ICD-10-CM | POA: Diagnosis not present

## 2017-10-08 DIAGNOSIS — K5909 Other constipation: Secondary | ICD-10-CM | POA: Diagnosis not present

## 2017-10-08 DIAGNOSIS — R932 Abnormal findings on diagnostic imaging of liver and biliary tract: Secondary | ICD-10-CM

## 2017-10-08 DIAGNOSIS — Z01812 Encounter for preprocedural laboratory examination: Secondary | ICD-10-CM | POA: Diagnosis not present

## 2017-10-08 DIAGNOSIS — Z86010 Personal history of colonic polyps: Secondary | ICD-10-CM | POA: Diagnosis not present

## 2017-10-08 DIAGNOSIS — R140 Abdominal distension (gaseous): Secondary | ICD-10-CM | POA: Diagnosis not present

## 2017-10-09 ENCOUNTER — Telehealth: Payer: Self-pay

## 2017-10-09 ENCOUNTER — Other Ambulatory Visit: Payer: Self-pay | Admitting: Family Medicine

## 2017-10-09 MED ORDER — SERTRALINE HCL 100 MG PO TABS: 100.0000 mg | ORAL_TABLET | Freq: Every day | ORAL | 3 refills | Status: DC

## 2017-10-09 NOTE — Telephone Encounter (Signed)
Please advise 

## 2017-10-09 NOTE — Telephone Encounter (Signed)
[facility] pharmacy faxed a refill request for the following medication. Thanks CC  sertraline (ZOLOFT) 100 MG tablet

## 2017-10-09 NOTE — Telephone Encounter (Signed)
-----   Message from Jerrol Banana., MD sent at 10/04/2017  4:01 PM EDT ----- CT liver protocol to be complete.

## 2017-10-09 NOTE — Telephone Encounter (Signed)
Advised patient as below, patient states that she had saw Sharyn Lull a P. A at Dr. Dillard Essex office who told her yesterday that report you mentioned below was abnormal. She suggested to the patient that she proceed with MRI, Endoscopy and Colonoscopy. She states that she was told her blood work is normal. Patient is requesting that you review over report again as well as the office notes from Dr. Dillard Essex office, patient states that these further test are expensive and does not want to get them if you feel that it is not needed. KW

## 2017-10-10 DIAGNOSIS — R69 Illness, unspecified: Secondary | ICD-10-CM | POA: Diagnosis not present

## 2017-10-10 NOTE — Telephone Encounter (Signed)
Labs were normal but I would proceed with testing.

## 2017-10-10 NOTE — Telephone Encounter (Signed)
Pt called back today wanting to talk to one of Dr. Marlan Palau nurses about the message from yesterday regarding her liver test.  She does not want to get these extra test done if she does not need to.  She would just like to get Dr. Marlan Palau opinion.  Her call back is [phone]  thanks teri

## 2017-10-11 NOTE — Telephone Encounter (Signed)
Advised  ED 

## 2017-10-16 ENCOUNTER — Ambulatory Visit (INDEPENDENT_AMBULATORY_CARE_PROVIDER_SITE_OTHER): Payer: Medicare HMO | Admitting: Family Medicine

## 2017-10-16 VITALS — BP 142/60 | HR 62 | Temp 98.1°F | Resp 16 | Wt 131.0 lb

## 2017-10-16 DIAGNOSIS — R109 Unspecified abdominal pain: Secondary | ICD-10-CM | POA: Diagnosis not present

## 2017-10-16 DIAGNOSIS — K900 Celiac disease: Secondary | ICD-10-CM

## 2017-10-16 DIAGNOSIS — D241 Benign neoplasm of right breast: Secondary | ICD-10-CM

## 2017-10-16 NOTE — Progress Notes (Signed)
Sandra [name]  MRN: [id number] DOB: June 18, 1945  Subjective:  HPI   The patient is a 72 year old female who presents for follow up of her GI issues.  She was last seen on 10/02/17 and at that time a referral was made to see her GI doctor, Dr Vira Agar.  She has also been scheduled for abdominal ultrasound which was done on 10/04/17. Ultrasound results:  Hypoattenuated appearance of the caudate lobe compared to the rest of the liver parenchyma may represent fatty infiltration of the liver with fatty sparing of the caudate lobe, however an infiltrative mass is difficult to exclude. Evaluation with CT or MRI of the abdomen, liver protocol, may be considered for further evaluation.  Otherwise no acute abnormalities within the abdomen.  She is scheduled for MRI tomorrow.  She is being scheduled for EGD and Colonoscopy by Dr. Percell Boston office.  Patient reports some improvement in her pain and bloating although still present.  She has not had any more diarrhea.  She takes her last Align today and states she is going to discontinue them as she does not see any change since being on them.  She has had 1 episode of significant heartburn.  She thinks this may have been from some late day coffee intake.  She is currently taking Omeprazole BID.   Patient Active Problem List   Diagnosis Date Noted  . Essential hypertension 05/17/2017  . Glaucoma (increased eye pressure) 02/06/2017  . Invasive ductal carcinoma of breast, female, right ([place]) 10/20/2016  . Prolapse of vaginal vault after hysterectomy 08/24/2016  . Vaginal atrophy 08/24/2016  . Dyspareunia in female 08/24/2016  . Colon polyps 01/06/2015  . BCC (basal cell carcinoma of skin) 01/06/2015  . Hypothyroidism 01/06/2015  . Vitamin D deficiency 01/06/2015  . Hyperlipidemia 01/06/2015  . Depression, major 01/06/2015  . GAD (generalized anxiety disorder) 01/06/2015  . RLS (restless legs syndrome) 01/06/2015  . Cataract 01/06/2015  .  Raynaud phenomenon 01/06/2015  . GERD (gastroesophageal reflux disease) 01/06/2015  . Celiac disease 01/06/2015  . OA (osteoarthritis) 01/06/2015  . Osteoporosis 01/06/2015  . Pre-diabetes 01/06/2015  . Avitaminosis D 01/06/2015  . Raynaud's syndrome without gangrene 01/06/2015  . Age-related osteoporosis without current pathological fracture 01/06/2015  . Anxiety disorder 01/05/2015    Past Medical History:  Diagnosis Date  . Abnormality of left ventricle of heart    MILD-MODERATE THICKENING PER ECHO ON 01-18-17  . Anemia   . Anxiety   . Arthritis   . BRCA gene mutation negative 10/2016   [facility]  . Breast cancer ([place]) 10/16/2016   T1c,N0;, GRADE I/III, 1.6cm. ER/PR pos  HER2 not over expressed, Right Upper Outer  . Celiac disease   . Depression   . Diastolic dysfunction   . Dyspnea    WITH EXERTION  . GERD (gastroesophageal reflux disease)    OCC  . Glaucoma    right eye  . Headache    H/O MIGRAINES AS TEENAGER  . Heart murmur    PT WAS TOLD ONCE SHE HAD MVP AND THEN ANOTHER MD SAID SHE DID NOT HAVE MVP  . Hypothyroidism   . Pneumonia    YEARS AGO  . Raynaud's disease   . RLS (restless legs syndrome)     Social History   Socioeconomic History  . Marital status: Married    Spouse name: Not on file  . Number of children: Not on file  . Years of education: Not on file  . Highest education level:  Not on file  Occupational History  . Not on file  Social Needs  . Financial resource strain: Not on file  . Food insecurity:    Worry: Not on file    Inability: Not on file  . Transportation needs:    Medical: Not on file    Non-medical: Not on file  Tobacco Use  . Smoking status: Never Smoker  . Smokeless tobacco: Never Used  Substance and Sexual Activity  . Alcohol use: No  . Drug use: No  . Sexual activity: Not Currently    Birth control/protection: None  Lifestyle  . Physical activity:    Days per week: Not on file    Minutes per session: Not on file    . Stress: Not on file  Relationships  . Social connections:    Talks on phone: Not on file    Gets together: Not on file    Attends religious service: Not on file    Active member of club or organization: Not on file    Attends meetings of clubs or organizations: Not on file    Relationship status: Not on file  . Intimate partner violence:    Fear of current or ex partner: Not on file    Emotionally abused: Not on file    Physically abused: Not on file    Forced sexual activity: Not on file  Other Topics Concern  . Not on file  Social History Narrative  . Not on file    Outpatient Encounter Medications as of 10/16/2017  Medication Sig  . acetaminophen (TYLENOL) 500 MG tablet Take 500 mg by mouth every 6 (six) hours as needed for mild pain, moderate pain or headache.  . ALPRAZolam (XANAX) 0.5 MG tablet Take 1 tablet (0.5 mg total) by mouth at bedtime.  [name] amLODipine (NORVASC) 5 MG tablet Take 1 tablet (5 mg total) by mouth daily.  . Calcium Carbonate (CALCIUM 600 PO) Take 600 mg by mouth daily at 3 pm.   . carvedilol (COREG) 3.125 MG tablet Take 1 tablet (3.125 mg total) by mouth 2 (two) times daily.  . Cholecalciferol (VITAMIN D3) 10000 units TABS Take 1,000 Units by mouth daily at 3 pm.   . dicyclomine (BENTYL) 10 MG capsule Take 1 capsule (10 mg total) by mouth 3 (three) times daily before meals.  . hydroxypropyl methylcellulose / hypromellose (ISOPTO TEARS / GONIOVISC) 2.5 % ophthalmic solution Place 1 drop into both eyes 4 (four) times daily as needed for dry eyes (scheduled each morning & then as needed).  . latanoprost (XALATAN) 0.005 % ophthalmic solution INT 1 GTT IN OD 1 TIME IN THE EVE  . letrozole (FEMARA) 2.5 MG tablet Take 1 tablet (2.5 mg total) by mouth daily.  [name] levothyroxine (SYNTHROID, LEVOTHROID) 100 MCG tablet TAKE 1 TABLET BY MOUTH  DAILY (Patient taking differently: TAKE 1 TABLET (100 MCG) BY MOUTH  BEFORE BREAKFAST *30 MINS PRIOR TO BREAKFAST*)  . Multiple  Vitamin (MULTIVITAMIN WITH MINERALS) TABS tablet Take 1 tablet by mouth daily at 3 pm.  . Omega-3 Fatty Acids (FISH OIL PO) Take 1 capsule by mouth daily.   [name] omeprazole (PRILOSEC) 20 MG capsule Take 1 capsule (20 mg total) by mouth 2 (two) times daily before a meal.  . sertraline (ZOLOFT) 100 MG tablet Take 1 tablet (100 mg total) by mouth daily.  . Simethicone (GAS-X PO) Take 1 tablet by mouth as needed.   No facility-administered encounter medications on file as of 10/16/2017.  Allergies  Allergen Reactions  . Benadryl [Diphenhydramine Hcl (Sleep)] Other (See Comments)    Hyperactivity/causes opposite effect  . Advil [Ibuprofen] Rash    CAN TAKE NAPROSYN  . Claritin [Loratadine] Rash    Review of Systems  Constitutional: Positive for malaise/fatigue. Negative for fever.  Eyes: Negative.   Respiratory: Negative for cough, shortness of breath and wheezing.   Cardiovascular: Negative for chest pain, palpitations, orthopnea, claudication and leg swelling.  Gastrointestinal: Positive for abdominal pain (occasional), constipation and heartburn (once). Negative for blood in stool, diarrhea, melena, nausea and vomiting.  Musculoskeletal: Negative.   Skin: Negative.   Neurological: Negative.   Endo/Heme/Allergies: Negative.   Psychiatric/Behavioral: Negative.     Objective:  BP (!) 142/60 (BP Location: Right Arm, Patient Position: Sitting, Cuff Size: Normal)   Pulse 62   Temp 98.1 F (36.7 C) (Oral)   Resp 16   Wt 131 lb (59.4 kg)   BMI 22.49 kg/m   Physical Exam  Constitutional: She is oriented to person, place, and time and well-developed, well-nourished, and in no distress.  HENT:  Head: Normocephalic and atraumatic.  Eyes: Conjunctivae are normal. No scleral icterus.  Neck: No thyromegaly present.  Cardiovascular: Normal rate, regular rhythm and normal heart sounds.  Pulmonary/Chest: Effort normal and breath sounds normal.  Abdominal: Soft.  Musculoskeletal: She  exhibits no edema.  Neurological: She is alert and oriented to person, place, and time. Gait normal. GCS score is 15.  Skin: Skin is warm and dry.  Psychiatric: Mood, memory, affect and judgment normal.    Assessment and Plan :  Abdominal Pain Pt instructed to f/u with GI for appropriate testing. HTN Breast Cancer  I have done the exam and reviewed the chart and it is accurate to the best of my knowledge. Development worker, community has been used and  any errors in dictation or transcription are unintentional. [name] M.D. [facility]

## 2017-10-17 ENCOUNTER — Ambulatory Visit
Admission: RE | Admit: 2017-10-17 | Discharge: 2017-10-17 | Disposition: A | Discharge status: Home / Self Care | Payer: Medicare HMO | Source: Ambulatory Visit | Attending: Student | Admitting: Student

## 2017-10-17 DIAGNOSIS — R740 Nonspecific elevation of levels of transaminase and lactic acid dehydrogenase [LDH]: Secondary | ICD-10-CM | POA: Insufficient documentation

## 2017-10-17 DIAGNOSIS — R7401 Elevation of levels of liver transaminase levels: Secondary | ICD-10-CM

## 2017-10-17 DIAGNOSIS — R932 Abnormal findings on diagnostic imaging of liver and biliary tract: Secondary | ICD-10-CM | POA: Diagnosis present

## 2017-10-17 DIAGNOSIS — K760 Fatty (change of) liver, not elsewhere classified: Secondary | ICD-10-CM | POA: Diagnosis not present

## 2017-10-17 MED ORDER — GADOBENATE DIMEGLUMINE 529 MG/ML IV SOLN
15.0000 mL | Freq: Once | INTRAVENOUS | Status: AC | PRN
  Administered 2017-10-17: 12 mL via INTRAVENOUS

## 2017-10-19 DIAGNOSIS — K760 Fatty (change of) liver, not elsewhere classified: Secondary | ICD-10-CM | POA: Diagnosis not present

## 2017-10-20 ENCOUNTER — Ambulatory Visit: Payer: Self-pay | Admitting: Family Medicine

## 2017-10-23 ENCOUNTER — Encounter: Payer: Self-pay | Admitting: Physician Assistant

## 2017-10-23 ENCOUNTER — Ambulatory Visit (INDEPENDENT_AMBULATORY_CARE_PROVIDER_SITE_OTHER): Payer: Medicare HMO | Admitting: Physician Assistant

## 2017-10-23 ENCOUNTER — Telehealth: Payer: Self-pay | Admitting: Family Medicine

## 2017-10-23 VITALS — BP 122/64 | HR 72 | Temp 98.2°F | Resp 16 | Wt 131.0 lb

## 2017-10-23 DIAGNOSIS — N3090 Cystitis, unspecified without hematuria: Secondary | ICD-10-CM

## 2017-10-23 LAB — POCT URINALYSIS DIPSTICK
Bilirubin, UA: NEGATIVE
Glucose, UA: NEGATIVE
Ketones, UA: NEGATIVE
Nitrite, UA: NEGATIVE
Protein, UA: POSITIVE — AB
Spec Grav, UA: <=1.005 — AB (ref 1.010–1.025)
Urobilinogen, UA: 0.2 E.U./dL
pH, UA: 6.5 (ref 5.0–8.0)

## 2017-10-23 MED ORDER — SULFAMETHOXAZOLE-TRIMETHOPRIM 800-160 MG PO TABS: 1.0000 | ORAL_TABLET | Freq: Two times a day (BID) | ORAL | 0 refills | Status: DC

## 2017-10-23 NOTE — Telephone Encounter (Signed)
LMTCB ED 

## 2017-10-23 NOTE — Telephone Encounter (Signed)
Pt stated that she has been caring for her sister that was recently in hospital for e-coli and UTI. Pt is asking if e-coli can be contagious. Pt is also having pain and pressure in lower abdomen and frequency voiding to the point she can't control her bladder. Pt was offered OV but wanted to discuss with CMA first. Please advise. Thanks TNP

## 2017-10-23 NOTE — Patient Instructions (Signed)

## 2017-10-23 NOTE — Progress Notes (Signed)
[facility]  Chief Complaint  Patient presents with  . Urinary Tract Infection    Subjective:    Patient ID: Sandra [name], female    DOB: [birth year], 72 y.o.   MRN: [id number]   Urinary Tract Infection: Patient complains of burning with urination, frequency, incomplete bladder emptying and suprapubic pressure She has had symptoms for 1 day. Patient also complains of back pain and stomach ache. Patient denies fever and vaginal discharge. Patient does have a history of recurrent UTI.  Patient does not have a history of pyelonephritis or other renal issues. Patient denies vaginal discharge and denies new sexual partners. The patient denies recent travel outside of the [place].  Review of Systems  Constitutional: Positive for chills and malaise/fatigue. Negative for diaphoresis, fever and weight loss.  Gastrointestinal: Positive for abdominal pain and constipation. Negative for blood in stool, diarrhea, heartburn, melena, nausea and vomiting.  Genitourinary: Positive for dysuria, frequency and urgency. Negative for flank pain and hematuria.  Neurological: Negative for dizziness and headaches.       Objective:   BP 122/64 (BP Location: Left Arm, Patient Position: Sitting, Cuff Size: Normal)   Pulse 72   Temp 98.2 F (36.8 C) (Oral)   Resp 16   Wt 131 lb (59.4 kg)   BMI 22.49 kg/m   Patient Active Problem List   Diagnosis Date Noted  . Essential hypertension 05/17/2017  . Glaucoma (increased eye pressure) 02/06/2017  . Invasive ductal carcinoma of breast, female, right ([place]) 10/20/2016  . Prolapse of vaginal vault after hysterectomy 08/24/2016  . Vaginal atrophy 08/24/2016  . Dyspareunia in female 08/24/2016  . Colon polyps 01/06/2015  . BCC (basal cell carcinoma of skin) 01/06/2015  . Hypothyroidism 01/06/2015  . Vitamin D deficiency 01/06/2015  . Hyperlipidemia 01/06/2015  . Depression, major 01/06/2015  . GAD (generalized  anxiety disorder) 01/06/2015  . RLS (restless legs syndrome) 01/06/2015  . Cataract 01/06/2015  . Raynaud phenomenon 01/06/2015  . GERD (gastroesophageal reflux disease) 01/06/2015  . Celiac disease 01/06/2015  . OA (osteoarthritis) 01/06/2015  . Osteoporosis 01/06/2015  . Pre-diabetes 01/06/2015  . Avitaminosis D 01/06/2015  . Raynaud's syndrome without gangrene 01/06/2015  . Age-related osteoporosis without current pathological fracture 01/06/2015  . Anxiety disorder 01/05/2015    Outpatient Encounter Medications as of 10/23/2017  Medication Sig  . acetaminophen (TYLENOL) 500 MG tablet Take 500 mg by mouth every 6 (six) hours as needed for mild pain, moderate pain or headache.  . ALPRAZolam (XANAX) 0.5 MG tablet Take 1 tablet (0.5 mg total) by mouth at bedtime.  . Calcium Carbonate (CALCIUM 600 PO) Take 600 mg by mouth daily at 3 pm.   . Cholecalciferol (VITAMIN D3) 10000 units TABS Take 1,000 Units by mouth daily at 3 pm.   . dicyclomine (BENTYL) 10 MG capsule Take 1 capsule (10 mg total) by mouth 3 (three) times daily before meals.  . hydroxypropyl methylcellulose / hypromellose (ISOPTO TEARS / GONIOVISC) 2.5 % ophthalmic solution Place 1 drop into both eyes 4 (four) times daily as needed for dry eyes (scheduled each morning & then as needed).  . latanoprost (XALATAN) 0.005 % ophthalmic solution INT 1 GTT IN OD 1 TIME IN THE EVE  . letrozole (FEMARA) 2.5 MG tablet Take 1 tablet (2.5 mg total) by mouth daily.  [name] levothyroxine (SYNTHROID, LEVOTHROID) 100 MCG tablet TAKE 1 TABLET BY MOUTH  DAILY (Patient taking differently: TAKE 1 TABLET (100 MCG) BY MOUTH  BEFORE BREAKFAST *30  MINS PRIOR TO BREAKFAST*)  . Multiple Vitamin (MULTIVITAMIN WITH MINERALS) TABS tablet Take 1 tablet by mouth daily at 3 pm.  . Omega-3 Fatty Acids (FISH OIL PO) Take 1 capsule by mouth daily.   [name] omeprazole (PRILOSEC) 20 MG capsule Take 1 capsule (20 mg total) by mouth 2 (two) times daily before a meal.  .  sertraline (ZOLOFT) 100 MG tablet Take 1 tablet (100 mg total) by mouth daily.  . Simethicone (GAS-X PO) Take 1 tablet by mouth as needed.  [name] amLODipine (NORVASC) 5 MG tablet Take 1 tablet (5 mg total) by mouth daily.  . carvedilol (COREG) 3.125 MG tablet Take 1 tablet (3.125 mg total) by mouth 2 (two) times daily.   No facility-administered encounter medications on file as of 10/23/2017.     Allergies  Allergen Reactions  . Benadryl [Diphenhydramine Hcl (Sleep)] Other (See Comments)    Hyperactivity/causes opposite effect  . Advil [Ibuprofen] Rash    CAN TAKE NAPROSYN  . Claritin [Loratadine] Rash       Physical Exam  Constitutional: She is oriented to person, place, and time. She appears well-developed and well-nourished. No distress.  Cardiovascular: Normal rate and regular rhythm.  Pulmonary/Chest: Effort normal and breath sounds normal.  Abdominal: Soft. Bowel sounds are normal. She exhibits no distension. There is tenderness in the suprapubic area. There is no rebound, no guarding and no CVA tenderness.  Neurological: She is alert and oriented to person, place, and time.  Skin: Skin is warm and dry. She is not diaphoretic.  Psychiatric: She has a normal mood and affect. Her behavior is normal.       Assessment & Plan:  1. Cystitis  Start empirical Bactrim. Will adjust pending culture.   - POCT urinalysis dipstick - CULTURE, URINE COMPREHENSIVE  Return if symptoms worsen or fail to improve.  The entirety of the information documented in the History of Present Illness, Review of Systems and Physical Exam were personally obtained by me. Portions of this information were initially documented by Ashley Royalty, CMA and reviewed by me for thoroughness and accuracy.

## 2017-10-23 NOTE — Telephone Encounter (Signed)
Patient seen.

## 2017-10-23 NOTE — Telephone Encounter (Signed)
Pt Returned call  Sandra [name]

## 2017-10-24 ENCOUNTER — Telehealth: Payer: Self-pay

## 2017-10-24 ENCOUNTER — Ambulatory Visit: Payer: Self-pay | Admitting: Nurse Practitioner

## 2017-10-24 ENCOUNTER — Telehealth: Payer: Self-pay | Admitting: Family Medicine

## 2017-10-24 DIAGNOSIS — R102 Pelvic and perineal pain: Secondary | ICD-10-CM

## 2017-10-24 DIAGNOSIS — N3090 Cystitis, unspecified without hematuria: Secondary | ICD-10-CM

## 2017-10-24 MED ORDER — CIPROFLOXACIN HCL 250 MG PO TABS: 250.0000 mg | ORAL_TABLET | Freq: Two times a day (BID) | ORAL | 0 refills | Status: DC

## 2017-10-24 MED ORDER — TRAMADOL HCL 50 MG PO TABS: 50.0000 mg | ORAL_TABLET | Freq: Two times a day (BID) | ORAL | 0 refills | Status: AC | PRN

## 2017-10-24 NOTE — Telephone Encounter (Signed)
Patient called office with complaints of severe sharp pain in her back and pelvic area. Patient was seen in office yesterday for UTI and prescribed antibiotic, patient states that she has taken 3 of the tablets since yesterday and reports that symptoms have become more severe. Patient states that pain is so bad that it is taking her breath away, but denies symptoms of shortness of breath. Patient denied fever, nausea or vomiting, patient would like to know if there is anything she can take or that can be called in to relieve her of pain? Please review chart and advise. KW

## 2017-10-24 NOTE — Telephone Encounter (Addendum)
Patient states " whatever Fabio Bering feels best I'll do because pain is severe", patient uses [facility] in [place]

## 2017-10-24 NOTE — Telephone Encounter (Signed)
Generally, a UTI wouldn't cause severe pain like she is describing. If she feels the Bactrim has not been helpful, I will change it to Cipro 250 mg BID x 3 days and she can stop the Bactrim. Another consideration is she may have a kidney stone. I will send her in a pain medication tramadol for five days. If her symptoms worsen, please have her come be re-evaluated.

## 2017-10-25 ENCOUNTER — Other Ambulatory Visit: Payer: Self-pay | Admitting: Physician Assistant

## 2017-10-25 ENCOUNTER — Telehealth: Payer: Self-pay

## 2017-10-25 DIAGNOSIS — N3090 Cystitis, unspecified without hematuria: Secondary | ICD-10-CM

## 2017-10-25 LAB — CULTURE, URINE COMPREHENSIVE

## 2017-10-25 MED ORDER — AMOXICILLIN-POT CLAVULANATE 875-125 MG PO TABS: 1.0000 | ORAL_TABLET | Freq: Two times a day (BID) | ORAL | 0 refills | Status: AC

## 2017-10-25 NOTE — Telephone Encounter (Signed)
Pt will pick up the Augmentin.  teri

## 2017-10-25 NOTE — Progress Notes (Signed)
Sent in Augmentin for patient. Please have her follow up if she is worsening please.

## 2017-10-25 NOTE — Telephone Encounter (Signed)
Pt advised.  She states she has already taking two doses of Cipro and feels significantly better.  She is wanting to know if she can continue the cipro or she she start Augmentin?   Thanks,   -Mickel Baas

## 2017-10-25 NOTE — Telephone Encounter (Signed)
I do see where she is coming from, because we have switched antibiotics once already. However, the organism is completely resistant to Cipro, not just intermediate. I would try the Augmentin and I did send it in for her.

## 2017-10-25 NOTE — Telephone Encounter (Signed)
-----   Message from Trinna Post, [place] sent at 10/25/2017 11:47 AM EDT ----- Her urine culture grew out e. Coli that is resistant to both bactrim and cipro. I am going to send in Augmentin, 1 pill twice daily for seven days for her to take as this is sensitive.

## 2017-10-25 NOTE — Telephone Encounter (Signed)
I have [facility] in [facility].

## 2017-10-26 NOTE — Telephone Encounter (Signed)
Pt advised.  She agreed to start Augmentin.  She just wanted to thank you for seeing her the other day.  She is feeling much better.   Thanks,   -Mickel Baas

## 2017-11-07 ENCOUNTER — Other Ambulatory Visit: Payer: Self-pay

## 2017-11-07 MED ORDER — AMLODIPINE BESYLATE 5 MG PO TABS: 5.0000 mg | ORAL_TABLET | Freq: Every day | ORAL | 6 refills | Status: DC

## 2017-11-07 NOTE — Telephone Encounter (Signed)
Refill sent for amlodipine 5 mg

## 2017-11-19 ENCOUNTER — Ambulatory Visit (INDEPENDENT_AMBULATORY_CARE_PROVIDER_SITE_OTHER): Payer: Medicare HMO | Admitting: Family Medicine

## 2017-11-19 ENCOUNTER — Encounter: Payer: Self-pay | Admitting: Family Medicine

## 2017-11-19 VITALS — BP 126/62 | HR 64 | Temp 98.6°F | Resp 16 | Ht 64.0 in | Wt 129.0 lb

## 2017-11-19 DIAGNOSIS — N3090 Cystitis, unspecified without hematuria: Secondary | ICD-10-CM

## 2017-11-19 LAB — POCT URINALYSIS DIPSTICK
Bilirubin, UA: NEGATIVE
Glucose, UA: NEGATIVE
Ketones, UA: NEGATIVE
Nitrite, UA: NEGATIVE
Protein, UA: NEGATIVE
Spec Grav, UA: <=1.005 — AB (ref 1.010–1.025)
Urobilinogen, UA: 0.2 E.U./dL
pH, UA: 7.5 (ref 5.0–8.0)

## 2017-11-19 MED ORDER — NITROFURANTOIN MONOHYD MACRO 100 MG PO CAPS: 100.0000 mg | ORAL_CAPSULE | Freq: Two times a day (BID) | ORAL | 1 refills | Status: DC

## 2017-11-19 NOTE — Progress Notes (Signed)
Patient: Sandra [name] Female    DOB: [birth year]   72 y.o.   MRN: [id number] Visit Date: 11/19/2017  Today's Provider: [name], MD   Chief Complaint  Patient presents with  . Urinary Tract Infection   Subjective:    Urinary Tract Infection   This is a new problem. The current episode started in the past 7 days (about 2 days). The problem occurs every urination. The problem has been gradually worsening. There has been no fever. Associated symptoms include flank pain, frequency and urgency. Pertinent negatives include no chills, hematuria, hesitancy, nausea or vomiting. She has tried increased fluids for the symptoms.       Allergies  Allergen Reactions  . Benadryl [Diphenhydramine Hcl (Sleep)] Other (See Comments)    Hyperactivity/causes opposite effect  . Advil [Ibuprofen] Rash    CAN TAKE NAPROSYN  . Claritin [Loratadine] Rash     Current Outpatient Medications:  .  acetaminophen (TYLENOL) 500 MG tablet, Take 500 mg by mouth every 6 (six) hours as needed for mild pain, moderate pain or headache., Disp: , Rfl:  .  ALPRAZolam (XANAX) 0.5 MG tablet, Take 1 tablet (0.5 mg total) by mouth at bedtime., Disp: 90 tablet, Rfl: 1 .  amLODipine (NORVASC) 5 MG tablet, Take 1 tablet (5 mg total) by mouth daily., Disp: 30 tablet, Rfl: 6 .  Calcium Carbonate (CALCIUM 600 PO), Take 600 mg by mouth daily at 3 pm. , Disp: , Rfl:  .  Cholecalciferol (VITAMIN D3) 10000 units TABS, Take 1,000 Units by mouth daily at 3 pm. , Disp: , Rfl:  .  carvedilol (COREG) 3.125 MG tablet, Take 1 tablet (3.125 mg total) by mouth 2 (two) times daily., Disp: 180 tablet, Rfl: 3 .  dicyclomine (BENTYL) 10 MG capsule, Take 1 capsule (10 mg total) by mouth 3 (three) times daily before meals., Disp: 90 capsule, Rfl: 5 .  hydroxypropyl methylcellulose / hypromellose (ISOPTO TEARS / GONIOVISC) 2.5 % ophthalmic solution, Place 1 drop into both eyes 4 (four) times daily as needed for dry eyes (scheduled  each morning & then as needed)., Disp: , Rfl:  .  latanoprost (XALATAN) 0.005 % ophthalmic solution, INT 1 GTT IN OD 1 TIME IN THE EVE, Disp: , Rfl: 5 .  letrozole (FEMARA) 2.5 MG tablet, Take 1 tablet (2.5 mg total) by mouth daily., Disp: 30 tablet, Rfl: 2 .  levothyroxine (SYNTHROID, LEVOTHROID) 100 MCG tablet, TAKE 1 TABLET BY MOUTH  DAILY (Patient taking differently: TAKE 1 TABLET (100 MCG) BY MOUTH  BEFORE BREAKFAST *30 MINS PRIOR TO BREAKFAST*), Disp: 90 tablet, Rfl: 3 .  Multiple Vitamin (MULTIVITAMIN WITH MINERALS) TABS tablet, Take 1 tablet by mouth daily at 3 pm., Disp: , Rfl:  .  Omega-3 Fatty Acids (FISH OIL PO), Take 1 capsule by mouth daily. , Disp: , Rfl:  .  omeprazole (PRILOSEC) 20 MG capsule, Take 1 capsule (20 mg total) by mouth 2 (two) times daily before a meal., Disp: 180 capsule, Rfl: 3 .  sertraline (ZOLOFT) 100 MG tablet, Take 1 tablet (100 mg total) by mouth daily., Disp: 90 tablet, Rfl: 3 .  Simethicone (GAS-X PO), Take 1 tablet by mouth as needed., Disp: , Rfl:   Review of Systems  Constitutional: Negative for chills.  Eyes: Negative.   Respiratory: Negative.   Cardiovascular: Negative.   Gastrointestinal: Positive for abdominal pain. Negative for nausea and vomiting.  Endocrine: Negative.   Genitourinary: Positive for flank pain, frequency  and urgency. Negative for hematuria and hesitancy.  Allergic/Immunologic: Negative.   Neurological: Negative.   Psychiatric/Behavioral: Negative.     Social History   Tobacco Use  . Smoking status: Never Smoker  . Smokeless tobacco: Never Used  Substance Use Topics  . Alcohol use: No   Objective:   BP 126/62 (BP Location: Left Arm, Patient Position: Sitting, Cuff Size: Normal)   Pulse 64   Temp 98.6 F (37 C)   Resp 16   Ht 5' 4"  (1.626 m)   Wt 129 lb (58.5 kg)   SpO2 98%   BMI 22.14 kg/m  Vitals:   11/19/17 1037  BP: 126/62  Pulse: 64  Resp: 16  Temp: 98.6 F (37 C)  SpO2: 98%  Weight: 129 lb (58.5 kg)    Height: 5' 4"  (1.626 m)     Physical Exam  Constitutional: She is oriented to person, place, and time. She appears well-developed and well-nourished.  HENT:  Head: Normocephalic and atraumatic.  Eyes: Conjunctivae are normal. No scleral icterus.  Neck: No thyromegaly present.  Cardiovascular: Normal rate and regular rhythm.  Pulmonary/Chest: Effort normal.  Abdominal: Soft.  No CVT. Mild supropubic tenderness.  Musculoskeletal: She exhibits no edema.  Neurological: She is alert and oriented to person, place, and time.  Skin: Skin is warm and dry.  Psychiatric: She has a normal mood and affect. Her behavior is normal. Judgment and thought content normal.        Assessment & Plan:     1. Cystitis  - POCT urinalysis dipstick - Urine Culture - nitrofurantoin, macrocrystal-monohydrate, (MACROBID) 100 MG capsule; Take 1 capsule (100 mg total) by mouth 2 (two) times daily.  Dispense: 10 capsule; Refill: 1      I have done the exam and reviewed the above chart and it is accurate to the best of my knowledge. Development worker, community has been used in this note in any air is in the dictation or transcription are unintentional.  [name], MD  [place]

## 2017-11-21 LAB — URINE CULTURE

## 2017-11-26 ENCOUNTER — Telehealth: Payer: Self-pay

## 2017-11-26 NOTE — Telephone Encounter (Signed)
-----   Message from Jerrol Banana., MD sent at 11/26/2017  8:13 AM EDT ----- Covered.

## 2017-11-26 NOTE — Telephone Encounter (Signed)
Left message to call back  

## 2017-11-26 NOTE — Telephone Encounter (Signed)
Advised patient of results.  

## 2017-11-30 ENCOUNTER — Encounter: Payer: Self-pay | Admitting: General Surgery

## 2017-12-03 ENCOUNTER — Ambulatory Visit (INDEPENDENT_AMBULATORY_CARE_PROVIDER_SITE_OTHER): Payer: Medicare HMO | Admitting: Family Medicine

## 2017-12-03 VITALS — BP 122/68 | HR 58 | Temp 98.7°F | Resp 16 | Wt 129.0 lb

## 2017-12-03 DIAGNOSIS — F411 Generalized anxiety disorder: Secondary | ICD-10-CM

## 2017-12-03 DIAGNOSIS — M791 Myalgia, unspecified site: Secondary | ICD-10-CM | POA: Diagnosis not present

## 2017-12-03 DIAGNOSIS — F419 Anxiety disorder, unspecified: Secondary | ICD-10-CM

## 2017-12-03 DIAGNOSIS — J329 Chronic sinusitis, unspecified: Secondary | ICD-10-CM

## 2017-12-03 DIAGNOSIS — R399 Unspecified symptoms and signs involving the genitourinary system: Secondary | ICD-10-CM | POA: Diagnosis not present

## 2017-12-03 DIAGNOSIS — R69 Illness, unspecified: Secondary | ICD-10-CM | POA: Diagnosis not present

## 2017-12-03 LAB — POCT URINALYSIS DIPSTICK
Bilirubin, UA: NEGATIVE
Blood, UA: NEGATIVE
Glucose, UA: NEGATIVE
Ketones, UA: NEGATIVE
Leukocytes, UA: NEGATIVE
Nitrite, UA: NEGATIVE
Protein, UA: NEGATIVE
Spec Grav, UA: 1.01 (ref 1.010–1.025)
Urobilinogen, UA: 0.2 E.U./dL
pH, UA: 6 (ref 5.0–8.0)

## 2017-12-03 MED ORDER — ALPRAZOLAM 0.5 MG PO TABS: 0.5000 mg | ORAL_TABLET | Freq: Every day | ORAL | 1 refills | Status: DC

## 2017-12-03 MED ORDER — LEVOTHYROXINE SODIUM 100 MCG PO TABS: ORAL_TABLET | ORAL | 3 refills | Status: DC

## 2017-12-03 MED ORDER — GUAIFENESIN 100 MG/5ML PO SOLN: 5.0000 mL | ORAL | 0 refills | Status: DC | PRN

## 2017-12-03 MED ORDER — MAGNESIUM OXIDE 400 MG PO CAPS: 400.0000 mg | ORAL_CAPSULE | Freq: Two times a day (BID) | ORAL | 0 refills | Status: DC

## 2017-12-03 NOTE — Patient Instructions (Signed)
Take OTC Robitussin twice daily for the sinus congestion  Take OTC Magnesium Oxide 400 mg twice daily for the muscle cramps.

## 2017-12-03 NOTE — Progress Notes (Signed)
Patient: Sandra [name] Female    DOB: [birth year]   72 y.o.   MRN: [id number] Visit Date: 12/03/2017  Today's Provider: [name], MD   Chief Complaint  Patient presents with  . Hypothyroidism  . Insomnia   Subjective:    HPI Patient comes in today for a follow up. She was last seen in the office on 11/19/2017 due to cystis. She reports that the pain is gone, but she still has urinary frequency. She was treated with Macrobid and tolerated the medication well.   Hypothyroidism- No changes were made in her medications since last visit. She is currently taking levothyroxine 17mg daily, and reports that she needs a refill. Lab Results  Component Value Date   TSH 3.420 06/07/2017   Insomnia- Patient reports that she has not had a well rested nights sleep in over 1 month. She takes alprazolam at bedtime, but reports that it has not helped lately.   Patient also complains of a cough that she has had for several days.  She states it was from a sinus infection.  She states the postnasal drainage is the cause of the cough.    Allergies  Allergen Reactions  . Benadryl [Diphenhydramine Hcl (Sleep)] Other (See Comments)    Hyperactivity/causes opposite effect  . Advil [Ibuprofen] Rash    CAN TAKE NAPROSYN  . Claritin [Loratadine] Rash     Current Outpatient Medications:  .  acetaminophen (TYLENOL) 500 MG tablet, Take 500 mg by mouth every 6 (six) hours as needed for mild pain, moderate pain or headache., Disp: , Rfl:  .  ALPRAZolam (XANAX) 0.5 MG tablet, Take 1 tablet (0.5 mg total) by mouth at bedtime., Disp: 90 tablet, Rfl: 1 .  amLODipine (NORVASC) 5 MG tablet, Take 1 tablet (5 mg total) by mouth daily., Disp: 30 tablet, Rfl: 6 .  Calcium Carbonate (CALCIUM 600 PO), Take 600 mg by mouth daily at 3 pm. , Disp: , Rfl:  .  Cholecalciferol (VITAMIN D3) 10000 units TABS, Take 1,000 Units by mouth daily at 3 pm. , Disp: , Rfl:  .  dicyclomine (BENTYL) 10 MG capsule, Take 1  capsule (10 mg total) by mouth 3 (three) times daily before meals., Disp: 90 capsule, Rfl: 5 .  hydroxypropyl methylcellulose / hypromellose (ISOPTO TEARS / GONIOVISC) 2.5 % ophthalmic solution, Place 1 drop into both eyes 4 (four) times daily as needed for dry eyes (scheduled each morning & then as needed)., Disp: , Rfl:  .  latanoprost (XALATAN) 0.005 % ophthalmic solution, INT 1 GTT IN OD 1 TIME IN THE EVE, Disp: , Rfl: 5 .  letrozole (FEMARA) 2.5 MG tablet, Take 1 tablet (2.5 mg total) by mouth daily., Disp: 30 tablet, Rfl: 2 .  levothyroxine (SYNTHROID, LEVOTHROID) 100 MCG tablet, TAKE 1 TABLET BY MOUTH  DAILY (Patient taking differently: TAKE 1 TABLET (100 MCG) BY MOUTH  BEFORE BREAKFAST *30 MINS PRIOR TO BREAKFAST*), Disp: 90 tablet, Rfl: 3 .  Multiple Vitamin (MULTIVITAMIN WITH MINERALS) TABS tablet, Take 1 tablet by mouth daily at 3 pm., Disp: , Rfl:  .  Omega-3 Fatty Acids (FISH OIL PO), Take 1 capsule by mouth daily. , Disp: , Rfl:  .  omeprazole (PRILOSEC) 20 MG capsule, Take 1 capsule (20 mg total) by mouth 2 (two) times daily before a meal., Disp: 180 capsule, Rfl: 3 .  sertraline (ZOLOFT) 100 MG tablet, Take 1 tablet (100 mg total) by mouth daily., Disp: 90 tablet, Rfl:  3 .  Simethicone (GAS-X PO), Take 1 tablet by mouth as needed., Disp: , Rfl:  .  carvedilol (COREG) 3.125 MG tablet, Take 1 tablet (3.125 mg total) by mouth 2 (two) times daily., Disp: 180 tablet, Rfl: 3 .  nitrofurantoin, macrocrystal-monohydrate, (MACROBID) 100 MG capsule, Take 1 capsule (100 mg total) by mouth 2 (two) times daily. (Patient not taking: Reported on 12/03/2017), Disp: 10 capsule, Rfl: 1  Review of Systems  Constitutional: Positive for activity change and fatigue. Negative for appetite change, chills, diaphoresis, fever and unexpected weight change.  HENT: Positive for congestion, postnasal drip, sneezing and voice change.   Eyes: Negative.   Respiratory: Negative.   Cardiovascular: Negative for chest  pain and leg swelling.  Endocrine: Negative.   Genitourinary: Positive for frequency.  Musculoskeletal: Positive for arthralgias and myalgias.  Allergic/Immunologic: Negative.   Neurological: Positive for headaches. Negative for dizziness.  Psychiatric/Behavioral: Positive for sleep disturbance.    Social History   Tobacco Use  . Smoking status: Never Smoker  . Smokeless tobacco: Never Used  Substance Use Topics  . Alcohol use: No   Objective:   BP 122/68 (BP Location: Left Arm, Patient Position: Sitting, Cuff Size: Normal)   Pulse (!) 58   Temp 98.7 F (37.1 C)   Resp 16   Wt 129 lb (58.5 kg)   BMI 22.14 kg/m  Vitals:   12/03/17 1123  BP: 122/68  Pulse: (!) 58  Resp: 16  Temp: 98.7 F (37.1 C)  Weight: 129 lb (58.5 kg)     Physical Exam  Constitutional: She is oriented to person, place, and time. She appears well-developed and well-nourished.  HENT:  Head: Normocephalic and atraumatic.  Right Ear: External ear normal.  Left Ear: External ear normal.  Nose: Nose normal.  Mouth/Throat: Oropharynx is clear and moist.  Minimal maxillary sinus tenderness.  Eyes: Conjunctivae are normal. No scleral icterus.  Neck: No thyromegaly present.  Cardiovascular: Normal rate, regular rhythm and normal heart sounds.  Pulmonary/Chest: Effort normal and breath sounds normal.  Abdominal: Soft.  Musculoskeletal: She exhibits no edema.  Neurological: She is alert and oriented to person, place, and time.  Skin: Skin is warm and dry.  Psychiatric: She has a normal mood and affect. Her behavior is normal. Judgment and thought content normal.        Assessment & Plan:     1.  GAD Chronic issue. - ALPRAZolam (XANAX) 0.5 MG tablet; Take 1 tablet (0.5 mg total) by mouth at bedtime.  Dispense: 90 tablet; Refill: 1  2. Urinary tract infection symptoms None today. - POCT urinalysis dipstick  3. Sinusitis, unspecified chronicity, unspecified location/Acute Viral at this  time.  - guaiFENesin (ROBITUSSIN) 100 MG/5ML SOLN; Take 5 mLs (100 mg total) by mouth every 4 (four) hours as needed for cough or to loosen phlegm.  Dispense: 1200 mL; Refill: 0  4. Myalgia  - Magnesium Oxide 400 MG CAPS; Take 1 capsule (400 mg total) by mouth 2 (two) times daily.  Dispense: 60 capsule; Refill: 0       Richard Cranford Mon, MD  [facility]

## 2017-12-06 DIAGNOSIS — D123 Benign neoplasm of transverse colon: Secondary | ICD-10-CM | POA: Diagnosis not present

## 2017-12-06 DIAGNOSIS — D126 Benign neoplasm of colon, unspecified: Secondary | ICD-10-CM | POA: Diagnosis not present

## 2017-12-06 DIAGNOSIS — K635 Polyp of colon: Secondary | ICD-10-CM | POA: Diagnosis not present

## 2017-12-06 DIAGNOSIS — K640 First degree hemorrhoids: Secondary | ICD-10-CM | POA: Diagnosis not present

## 2017-12-06 DIAGNOSIS — K2970 Gastritis, unspecified, without bleeding: Secondary | ICD-10-CM | POA: Diagnosis not present

## 2017-12-06 DIAGNOSIS — K2950 Unspecified chronic gastritis without bleeding: Secondary | ICD-10-CM | POA: Diagnosis not present

## 2017-12-06 DIAGNOSIS — R1310 Dysphagia, unspecified: Secondary | ICD-10-CM | POA: Diagnosis not present

## 2017-12-06 DIAGNOSIS — D122 Benign neoplasm of ascending colon: Secondary | ICD-10-CM | POA: Diagnosis not present

## 2017-12-06 DIAGNOSIS — Z86010 Personal history of colonic polyps: Secondary | ICD-10-CM | POA: Diagnosis not present

## 2017-12-06 DIAGNOSIS — K3189 Other diseases of stomach and duodenum: Secondary | ICD-10-CM | POA: Diagnosis not present

## 2017-12-06 DIAGNOSIS — K30 Functional dyspepsia: Secondary | ICD-10-CM | POA: Diagnosis not present

## 2017-12-06 DIAGNOSIS — D125 Benign neoplasm of sigmoid colon: Secondary | ICD-10-CM | POA: Diagnosis not present

## 2017-12-06 DIAGNOSIS — K648 Other hemorrhoids: Secondary | ICD-10-CM | POA: Diagnosis not present

## 2017-12-06 DIAGNOSIS — K319 Disease of stomach and duodenum, unspecified: Secondary | ICD-10-CM | POA: Diagnosis not present

## 2017-12-06 DIAGNOSIS — K2990 Gastroduodenitis, unspecified, without bleeding: Secondary | ICD-10-CM | POA: Diagnosis not present

## 2017-12-26 ENCOUNTER — Encounter: Payer: Self-pay | Admitting: Internal Medicine

## 2017-12-26 ENCOUNTER — Ambulatory Visit: Payer: Medicare HMO | Admitting: Internal Medicine

## 2017-12-26 VITALS — BP 126/66 | HR 64 | Ht 63.0 in | Wt 131.8 lb

## 2017-12-26 DIAGNOSIS — I5032 Chronic diastolic (congestive) heart failure: Secondary | ICD-10-CM | POA: Diagnosis not present

## 2017-12-26 DIAGNOSIS — I10 Essential (primary) hypertension: Secondary | ICD-10-CM

## 2017-12-26 NOTE — Patient Instructions (Signed)
Medication Instructions:  Your physician recommends that you continue on your current medications as directed. Please refer to the Current Medication list given to you today.   Labwork: none  Testing/Procedures: none  Follow-Up: Your physician recommends that you schedule a follow-up appointment in: as needed.

## 2017-12-26 NOTE — Progress Notes (Signed)
Follow-up Outpatient Visit Date: 12/26/2017  Primary Care Provider: Jerrol Banana., MD [address] [place] [address]  Chief Complaint: Dyspnea on exertion  HPI:  Sandra [name] is a 72 y.o. year-old female with history of breast cancer, celiac disease, hypothyroidism, Raynaud's disease, restless leg syndrome, GERD, and anxiety, who presents for follow-up of dyspnea on exertion.  I last saw Sandra [name] in December, at which time she noted that her exertional dyspnea was stable to somewhat improved after addition of low-dose carvedilol.  Due to suboptimal blood pressure control, we added amlodipine 5 mg daily at that time.  Today, Sandra [name] reports that she feels well.  She has stable exertional dyspnea when walking up inclines.  She has not had any chest pain.  Palpitations have resolved with carvedilol.  She notes occasional brief orthostatic lightheadedness when standing up too quickly.  She denies orthopnea, PND, and edema.  She notes that her blood pressure has been well controlled with addition of amlodipine.  --------------------------------------------------------------------------------------------------  Cardiovascular History & Procedures: Cardiovascular Problems:  Abnormal EKG  Dyspnea on exertion  Risk Factors:  Age greater than 65  Cath/PCI:  None  CV Surgery:  None  EP Procedures and Devices:  None  Non-Invasive Evaluation(s):  Pharmacologic MPI (02/15/17): Low risk study without ischemia or scar. Hyperdynamic LVEF (>65%).  TTE (01/18/17): Normal LV size with mild LVH and moderate hypertrophy of the septum. No significant outflow tract gradient noted though near complete obliteration of the cavity was noted in systole. LVEF 60-65% with normal wall motion. Grade 2 diastolic dysfunction. Mild mitral regurgitation. Mild left atrial enlargement. Normal RV size and function. Normal pulmonary artery pressure.  Stress test (approximately  2012): Normal per patient.   Recent CV Pertinent Labs: Lab Results  Component Value Date   CHOL 207 (H) 06/07/2017   HDL 54 06/07/2017   LDLCALC 118 (H) 06/07/2017   TRIG 176 (H) 06/07/2017   CHOLHDL 3.8 06/07/2017   K 4.0 09/26/2017   BUN 9 09/26/2017   CREATININE 0.60 09/26/2017    Past medical and surgical history were reviewed and updated in EPIC.  Current Meds  Medication Sig  . acetaminophen (TYLENOL) 500 MG tablet Take 500 mg by mouth every 6 (six) hours as needed for mild pain, moderate pain or headache.  . ALPRAZolam (XANAX) 0.5 MG tablet Take 1 tablet (0.5 mg total) by mouth at bedtime.  [name] amLODipine (NORVASC) 5 MG tablet Take 1 tablet (5 mg total) by mouth daily.  . Calcium Carbonate (CALCIUM 600 PO) Take 600 mg by mouth daily at 3 pm.   . Cholecalciferol (VITAMIN D3) 1000 units CAPS Take 1,000 Units by mouth.  . hydroxypropyl methylcellulose / hypromellose (ISOPTO TEARS / GONIOVISC) 2.5 % ophthalmic solution Place 1 drop into both eyes 4 (four) times daily as needed for dry eyes (scheduled each morning & then as needed).  . latanoprost (XALATAN) 0.005 % ophthalmic solution INT 1 GTT IN OD 1 TIME IN THE EVE  . letrozole (FEMARA) 2.5 MG tablet Take 1 tablet (2.5 mg total) by mouth daily.  [name] levothyroxine (SYNTHROID, LEVOTHROID) 100 MCG tablet TAKE 1 TABLET (100 MCG) BY MOUTH  BEFORE BREAKFAST *30 MINS PRIOR TO BREAKFAST*  . Magnesium Oxide 400 MG CAPS Take 1 capsule (400 mg total) by mouth 2 (two) times daily.  . Multiple Vitamin (MULTIVITAMIN WITH MINERALS) TABS tablet Take 1 tablet by mouth daily at 3 pm.  . Omega-3 Fatty Acids (FISH OIL PO) Take  1 capsule by mouth daily.   [name] omeprazole (PRILOSEC) 20 MG capsule Take 1 capsule (20 mg total) by mouth 2 (two) times daily before a meal.  . sertraline (ZOLOFT) 100 MG tablet Take 1 tablet (100 mg total) by mouth daily.  . Simethicone (GAS-X PO) Take 1 tablet by mouth as needed.    Allergies: Benadryl [diphenhydramine hcl  (sleep)]; Advil [ibuprofen]; and Claritin [loratadine]  Social History   Tobacco Use  . Smoking status: Never Smoker  . Smokeless tobacco: Never Used  Substance Use Topics  . Alcohol use: No  . Drug use: No    Family History  Problem Relation Age of Onset  . Cancer Mother   . Anemia Mother   . Other Mother        lymphosarcoma  . Alcohol abuse Father   . Prostate cancer Father   . Breast cancer Sister        42; 1/2 sister, shared mother.  . Stroke Maternal Grandmother   . Breast cancer Maternal Grandmother 68  . Heart attack Maternal Grandfather   . CAD Paternal Grandfather     Review of Systems: A 12-system review of systems was performed and was negative except as noted in the HPI.  --------------------------------------------------------------------------------------------------  Physical Exam: BP 126/66 (BP Location: Left Arm, Patient Position: Sitting, Cuff Size: Normal)   Pulse 64   Ht 5' 3"  (1.6 m)   Wt 131 lb 12 oz (59.8 kg)   BMI 23.34 kg/m   General: NAD. HEENT: No conjunctival pallor or scleral icterus. Moist mucous membranes.  OP clear. Neck: Supple without lymphadenopathy, thyromegaly, JVD, or HJR. Lungs: Normal work of breathing. Clear to auscultation bilaterally without wheezes or crackles. Heart: Regular rate and rhythm with 1/6 systolic murmur.  No rubs or gallops. Non-displaced PMI. Abd: Bowel sounds present. Soft, NT/ND without hepatosplenomegaly Ext: No lower extremity edema. Radial, PT, and DP pulses are 2+ bilaterally. Skin: Warm and dry without rash.  EKG: Normal sinus rhythm with LVH and anterolateral ST/T changes.  No significant change since 05/16/2017.  Lab Results  Component Value Date   WBC 4.6 09/26/2017   HGB 11.4 09/26/2017   HCT 34.3 09/26/2017   MCV 88 09/26/2017   PLT 237 09/26/2017    Lab Results  Component Value Date   NA 141 09/26/2017   K 4.0 09/26/2017   CL 104 09/26/2017   CO2 25 09/26/2017   BUN 9 09/26/2017    CREATININE 0.60 09/26/2017   GLUCOSE 102 (H) 09/26/2017   ALT 41 (H) 09/26/2017    Lab Results  Component Value Date   CHOL 207 (H) 06/07/2017   HDL 54 06/07/2017   LDLCALC 118 (H) 06/07/2017   TRIG 176 (H) 06/07/2017   CHOLHDL 3.8 06/07/2017    --------------------------------------------------------------------------------------------------  ASSESSMENT AND PLAN: Chronic diastolic heart failure Sandra [name] continues to do well with stable exertional dyspnea when walking up inclines.  She is otherwise asymptomatic.  She appears euvolemic today with NYHA class II symptoms.  We will not make any medication changes today.  Hypertension Blood pressure under better control with addition of amlodipine at our last visit.  We will plan to continue carvedilol 3.125 mg BID and amlodipine 5 mg daily.  I will defer ongoing management to Dr. Rosanna Randy.  Follow-up: Return to clinic as needed.  Nelva Bush, MD 12/26/2017 2:56 PM

## 2018-01-10 DIAGNOSIS — R69 Illness, unspecified: Secondary | ICD-10-CM | POA: Diagnosis not present

## 2018-01-29 ENCOUNTER — Ambulatory Visit (INDEPENDENT_AMBULATORY_CARE_PROVIDER_SITE_OTHER): Payer: Medicare HMO | Admitting: Family Medicine

## 2018-01-29 ENCOUNTER — Encounter: Payer: Self-pay | Admitting: Family Medicine

## 2018-01-29 VITALS — BP 112/50 | HR 67 | Temp 98.8°F | Resp 16 | Wt 128.0 lb

## 2018-01-29 DIAGNOSIS — F4320 Adjustment disorder, unspecified: Secondary | ICD-10-CM | POA: Diagnosis not present

## 2018-01-29 DIAGNOSIS — K5900 Constipation, unspecified: Secondary | ICD-10-CM

## 2018-01-29 DIAGNOSIS — K900 Celiac disease: Secondary | ICD-10-CM | POA: Diagnosis not present

## 2018-01-29 DIAGNOSIS — R69 Illness, unspecified: Secondary | ICD-10-CM | POA: Diagnosis not present

## 2018-01-29 NOTE — Patient Instructions (Signed)
Start Miralax for constipation. Resume Celiac dietary food choices. Take Xanax twice daily for a few days.

## 2018-01-29 NOTE — Progress Notes (Signed)
  Subjective:     Patient ID: Sandra [name], female   DOB: [birth year], 72 y.o.   MRN: [id number] Chief Complaint  Patient presents with  . Abdominal Pain    Patient comes in office today with complaints of generalized abdominal pain for the past 3 days. Patient describes pain as sharp and pressure, she states that it radiates into her back. Associated with abdominal pain patient reportds constipation, bloating, fatigue, nausea, chills, weakness and decrease appetite.    HPI States her sister died two days ago.Reports she has not been eating much and has strayed from her Celiac diet.States she normally moves her bowels daily with last bowel movement two days. Currently on sertraline and alprazolam at bedtime. Has had recent colonoscopy (12/06/17) with polypectomy and EGD c/w gastritis.    Objective:   Physical Exam  Constitutional: She appears well-developed and well-nourished. She does not appear ill.  Abdominal: There is tenderness (right quadrants). There is no guarding.  Psychiatric:  Flat affect; not tearful       Assessment:    1. Adjustment disorder, unspecified type: increase alprazolam to twice daily for a few days  2. Celiac disease: resume gluten free diet  3. Constipation, unspecified constipation type; start Miralax as needed    Plan:    Further f/u as needed.

## 2018-02-05 ENCOUNTER — Other Ambulatory Visit: Payer: Self-pay | Admitting: Family Medicine

## 2018-02-05 DIAGNOSIS — R1084 Generalized abdominal pain: Secondary | ICD-10-CM

## 2018-02-07 DIAGNOSIS — H401111 Primary open-angle glaucoma, right eye, mild stage: Secondary | ICD-10-CM | POA: Diagnosis not present

## 2018-02-09 DIAGNOSIS — R69 Illness, unspecified: Secondary | ICD-10-CM | POA: Diagnosis not present

## 2018-02-11 ENCOUNTER — Other Ambulatory Visit: Payer: Self-pay | Admitting: Family Medicine

## 2018-02-11 NOTE — Telephone Encounter (Signed)
Medication was refilled on 02/07/18.

## 2018-02-11 NOTE — Telephone Encounter (Signed)
[facility] pharmacy faxed a refill request for the following medication. Thanks CC  omeprazole (PRILOSEC) 40 MG capsule

## 2018-02-12 ENCOUNTER — Ambulatory Visit: Payer: Medicare HMO | Admitting: General Surgery

## 2018-02-12 ENCOUNTER — Encounter: Payer: Self-pay | Admitting: General Surgery

## 2018-02-12 VITALS — BP 118/64 | HR 64 | Resp 12 | Ht 66.0 in | Wt 131.0 lb

## 2018-02-12 DIAGNOSIS — C50411 Malignant neoplasm of upper-outer quadrant of right female breast: Secondary | ICD-10-CM | POA: Diagnosis not present

## 2018-02-12 DIAGNOSIS — Z170 Estrogen receptor positive status [ER+]: Secondary | ICD-10-CM | POA: Diagnosis not present

## 2018-02-12 NOTE — Progress Notes (Signed)
Patient ID: Sandra [name], female   DOB: 09-06-1945, 72 y.o.   MRN: [id number]  Chief Complaint  Patient presents with  . Breast Cancer    HPI Sandra [name] is a 72 y.o. female here today for her follow up breast cancer. Patient states she is doing well.   Past Medical History:  Diagnosis Date  . Abnormality of left ventricle of heart    MILD-MODERATE THICKENING PER ECHO ON 01-18-17  . Anemia   . Anxiety   . Arthritis   . BRCA gene mutation negative 10/2016   [facility]  . Breast cancer ([place]) 10/16/2016   T1c,N0;, GRADE I/III, 1.6cm. ER/PR pos  HER2 not over expressed, Right Upper Outer  . Celiac disease   . Depression   . Diastolic dysfunction   . Dyspnea    WITH EXERTION  . GERD (gastroesophageal reflux disease)    OCC  . Glaucoma    right eye  . Headache    H/O MIGRAINES AS TEENAGER  . Heart murmur    PT WAS TOLD ONCE SHE HAD MVP AND THEN ANOTHER MD SAID SHE DID NOT HAVE MVP  . Hypothyroidism   . Pneumonia    YEARS AGO  . Raynaud's disease   . RLS (restless legs syndrome)     Past Surgical History:  Procedure Laterality Date  . ABDOMINAL HYSTERECTOMY    . APPENDECTOMY    . AXILLARY LYMPH NODE BIOPSY Right 03/05/2017   Procedure: AXILLARY LYMPH NODE BIOPSY;  Surgeon: Robert Bellow, MD;  Location: [facility] ORS;  Service: General;  Laterality: Right;  . CATARACT EXTRACTION Bilateral   . CESAREAN SECTION    . COLONOSCOPY WITH PROPOFOL N/A 05/19/2015   Procedure: COLONOSCOPY WITH PROPOFOL;  Surgeon: Manya Silvas, MD;  Location: [facility] ENDOSCOPY;  Service: Endoscopy;  Laterality: N/A;  . EYE SURGERY    . HAND SURGERY Left   . MASTECTOMY Right 11/07/2016   RESIDUAL INVASIVE MAMMARY CARCINOMA, SUBAREOLAR ANTERIOR TO PREVIOUS   . SENTINEL NODE BIOPSY Right 11/07/2016   Procedure: SENTINEL NODE BIOPSY;  Surgeon: Robert Bellow, MD;  Location: [facility] ORS;  Service: General;  Laterality: Right;  . SIMPLE MASTECTOMY WITH AXILLARY SENTINEL NODE BIOPSY Right 11/07/2016   Procedure: SIMPLE MASTECTOMY;  Surgeon: Robert Bellow, MD;  Location: [facility] ORS;  Service: General;  Laterality: Right;  . TONSILLECTOMY  AGE 71  . UPPER GI ENDOSCOPY  01/12/06   hiatus hernia    Family History  Problem Relation Age of Onset  . Cancer Mother   . Anemia Mother   . Other Mother        lymphosarcoma  . Alcohol abuse Father   . Prostate cancer Father   . Breast cancer Sister        83; 1/2 sister, shared mother.  . Stroke Maternal Grandmother   . Breast cancer Maternal Grandmother 68  . Heart attack Maternal Grandfather   . CAD Paternal Grandfather     Social History Social History   Tobacco Use  . Smoking status: Never Smoker  . Smokeless tobacco: Never Used  Substance Use Topics  . Alcohol use: No  . Drug use: No    Allergies  Allergen Reactions  . Benadryl [Diphenhydramine Hcl (Sleep)] Other (See Comments)    Hyperactivity/causes opposite effect  . Advil [Ibuprofen] Rash    CAN TAKE NAPROSYN  . Claritin [Loratadine] Rash    Current Outpatient Medications  Medication Sig Dispense Refill  . acetaminophen (TYLENOL) 500 MG tablet  Take 500 mg by mouth every 6 (six) hours as needed for mild pain, moderate pain or headache.    . ALPRAZolam (XANAX) 0.5 MG tablet Take 1 tablet (0.5 mg total) by mouth at bedtime. 90 tablet 1  . Calcium Carbonate (CALCIUM 600 PO) Take 600 mg by mouth daily at 3 pm.     . Cholecalciferol (VITAMIN D3) 1000 units CAPS Take 1,000 Units by mouth.    . dicyclomine (BENTYL) 10 MG capsule TAKE 1 CAPSULE (10 MG TOTAL) BY MOUTH 3 (THREE) TIMES DAILY BEFORE MEALS.    . hydroxypropyl methylcellulose / hypromellose (ISOPTO TEARS / GONIOVISC) 2.5 % ophthalmic solution Place 1 drop into both eyes 4 (four) times daily as needed for dry eyes (scheduled each morning & then as needed).    . latanoprost (XALATAN) 0.005 % ophthalmic solution INT 1 GTT IN OD 1 TIME IN THE EVE  5  . letrozole (FEMARA) 2.5 MG tablet Take 1 tablet (2.5 mg total) by  mouth daily. 30 tablet 2  . levothyroxine (SYNTHROID, LEVOTHROID) 100 MCG tablet TAKE 1 TABLET (100 MCG) BY MOUTH  BEFORE BREAKFAST *30 MINS PRIOR TO BREAKFAST* 90 tablet 3  . Multiple Vitamin (MULTIVITAMIN WITH MINERALS) TABS tablet Take 1 tablet by mouth daily at 3 pm.    . Omega-3 Fatty Acids (FISH OIL PO) Take 1 capsule by mouth daily.     [name] omeprazole (PRILOSEC) 40 MG capsule Take 1 capsule (40 mg total) by mouth daily. 90 capsule 3  . sertraline (ZOLOFT) 100 MG tablet Take 1 tablet (100 mg total) by mouth daily. 90 tablet 3  . Simethicone (GAS-X PO) Take 1 tablet by mouth as needed.    [name] amLODipine (NORVASC) 5 MG tablet Take 1 tablet (5 mg total) by mouth daily. 30 tablet 6  . carvedilol (COREG) 3.125 MG tablet Take 1 tablet (3.125 mg total) by mouth 2 (two) times daily. 180 tablet 3   No current facility-administered medications for this visit.     Review of Systems Review of Systems  Constitutional: Negative.   Respiratory: Negative.   Cardiovascular: Negative.     Blood pressure 118/64, pulse 64, resp. rate 12, height 5' 6"  (1.676 m), weight 131 lb (59.4 kg).  Physical Exam Physical Exam  Constitutional: She is oriented to person, place, and time. She appears well-developed and well-nourished.  Eyes: Conjunctivae are normal. No scleral icterus.  Neck: Neck supple.  Cardiovascular: Normal rate, regular rhythm and normal heart sounds.  Pulmonary/Chest: Effort normal and breath sounds normal. Right breast exhibits no inverted nipple, no mass, no nipple discharge, no skin change and no tenderness.  Left mastectomy site is clean and well healed.     Lymphadenopathy:    She has no cervical adenopathy.    She has no axillary adenopathy.       Right: No supraclavicular adenopathy present.       Left: No supraclavicular adenopathy present.  Neurological: She is alert and oriented to person, place, and time.  Skin: Skin is warm and dry.    Data Reviewed Screening right breast  mammogram dated August 13, 2017 reviewed.  BI-RADS-1.  Bone density testing dated July 16, 2017 arranged by Dr. Rosanna Randy showed osteopenia at all sites and osteoporosis in the right femoral neck. The patient had previously been managed with Fosamax, but apparently she is not presently making use of this medication.  Assessment    No evidence of recurrent cancer.  Osteoporosis tending toward osteopenia in spite of previous  treatment with Fosamax.  Ongoing need for letrozole therapy.    Plan  Return in March, 2020 with a right breast screening mammogram . The patient is aware to call back for any questions or concerns.  Will touch base with her PCP regarding osteoporosis management. HPI, Physical Exam, Assessment and Plan have been scribed under the direction and in the presence of Hervey Ard, MD.  Gaspar Cola, CMA  I have completed the exam and reviewed the above documentation for accuracy and completeness.  I agree with the above.  Haematologist has been used and any errors in dictation or transcription are unintentional.  Hervey Ard, M.D., F.A.C.S.  Forest Gleason Suhayb Anzalone 02/13/2018, 7:20 AM

## 2018-02-18 ENCOUNTER — Other Ambulatory Visit: Payer: Self-pay

## 2018-02-18 DIAGNOSIS — R1084 Generalized abdominal pain: Secondary | ICD-10-CM

## 2018-02-18 MED ORDER — OMEPRAZOLE 40 MG PO CPDR
40.0000 mg | DELAYED_RELEASE_CAPSULE | Freq: Every day | ORAL | 3 refills | Status: DC
Start: 2018-02-18 — End: 2018-10-22

## 2018-03-08 ENCOUNTER — Other Ambulatory Visit: Payer: Self-pay | Admitting: General Surgery

## 2018-03-08 DIAGNOSIS — H401111 Primary open-angle glaucoma, right eye, mild stage: Secondary | ICD-10-CM | POA: Diagnosis not present

## 2018-04-09 ENCOUNTER — Telehealth: Payer: Self-pay | Admitting: Internal Medicine

## 2018-04-09 NOTE — Telephone Encounter (Signed)
Please call to discuss patients medication. She has a question regarding Amlodipine. She asks if this would make her tired and lack of energy.

## 2018-04-10 NOTE — Telephone Encounter (Signed)
S/w patient. Over the past few months, she feels like she has lack of energy Start to do something and just gives out.  She feels more tired recently and hard to keep up with staying active. Wondering if the symptoms are from old age or her medications.  States her BP is under control. At last office visit in July, Dr End deferred further management of HTN to PCP, Dr Rosanna Randy. Encouraged patient to contact his office as these symptoms are new over the past few months. She will do so and call us back if anything further is needed from Dr End. She was appreciative.

## 2018-04-12 NOTE — Telephone Encounter (Signed)
I agree with recommendation for patient to f/u with PCP.  If symptoms persist despite workup by Dr. Rosanna Randy, she should let us know so that we can discuss further cardiac evaluation.  Nelva Bush, MD [facility] Pager: [phone]

## 2018-04-12 NOTE — Telephone Encounter (Signed)
Spoke with patient and reviewed recommendations from Dr. [name]. She verbalized understanding with no further questions at this time.

## 2018-04-17 DIAGNOSIS — R69 Illness, unspecified: Secondary | ICD-10-CM | POA: Diagnosis not present

## 2018-04-22 ENCOUNTER — Ambulatory Visit (INDEPENDENT_AMBULATORY_CARE_PROVIDER_SITE_OTHER): Payer: Medicare HMO

## 2018-04-22 VITALS — BP 124/62 | HR 66 | Temp 97.7°F | Ht 66.0 in | Wt 132.8 lb

## 2018-04-22 DIAGNOSIS — Z0000 Encounter for general adult medical examination without abnormal findings: Secondary | ICD-10-CM

## 2018-04-22 NOTE — Progress Notes (Signed)
Subjective:   Sandra [name] is a 72 y.o. female who presents for Medicare Annual (Subsequent) preventive examination.  Review of Systems:  N/A  Cardiac Risk Factors include: advanced age (>69mn, >>27women);hypertension;dyslipidemia     Objective:     Vitals: BP 124/62 (BP Location: Right Arm)   Pulse 66   Temp 97.7 F (36.5 C) (Oral)   Ht '5\' 6"'  (1.676 m)   Wt 132 lb 12.8 oz (60.2 kg)   BMI 21.43 kg/m   Body mass index is 21.43 kg/m.  Advanced Directives 04/22/2018 04/18/2017 03/05/2017 03/05/2017 11/07/2016 10/31/2016 04/07/2016  Does Patient Have a Medical Advance Directive? Yes Yes Yes No Yes Yes Yes  Type of AParamedicof [place]iver[place] will [place] will [place] will - [place] will - [place] WaterfordLiving will  Copy of HS[place]ain Chart? Yes - validated most recent copy scanned in chart (See row information) No - copy requested No - copy requested - Yes - No - copy requested  Would patient like information on creating a medical advance directive? - - - No - Patient declined - - -    Tobacco Social History   Tobacco Use  Smoking Status Never Smoker  Smokeless Tobacco Never Used     Counseling given: Not Answered   Clinical Intake:  Pre-visit preparation completed: Yes  Pain : No/denies pain Pain Score: 0-No pain     Nutritional Status: BMI of 19-24  Normal Nutritional Risks: None Diabetes: No  How often do you need to have someone help you when you read instructions, pamphlets, or other written materials from your doctor or pharmacy?: 1 - Never  Interpreter Needed?: No  [facility] :: [facility]  Past Medical History:  Diagnosis Date  . Abnormality of left ventricle of heart    MILD-MODERATE THICKENING PER ECHO ON 01-18-17  . Anemia   . Anxiety   . Arthritis   . BRCA gene mutation negative  10/2016   [facility]  . Breast cancer ([place] 10/16/2016   T1c,N0;, GRADE I/III, 1.6cm. ER/PR pos  HER2 not over expressed, Right Upper Outer  . Celiac disease   . Depression   . Diastolic dysfunction   . Dyspnea    WITH EXERTION  . GERD (gastroesophageal reflux disease)    OCC  . Glaucoma    right eye  . Headache    H/O MIGRAINES AS TEENAGER  . Heart murmur    PT WAS TOLD ONCE SHE HAD MVP AND THEN ANOTHER MD SAID SHE DID NOT HAVE MVP  . Hypothyroidism   . Pneumonia    YEARS AGO  . Raynaud's disease   . RLS (restless legs syndrome)    Past Surgical History:  Procedure Laterality Date  . ABDOMINAL HYSTERECTOMY    . APPENDECTOMY    . AXILLARY LYMPH NODE BIOPSY Right 03/05/2017   Procedure: AXILLARY LYMPH NODE BIOPSY;  Surgeon: BRobert Bellow MD;  Location: [facility] ORS;  Service: General;  Laterality: Right;  . CATARACT EXTRACTION Bilateral   . CESAREAN SECTION    . COLONOSCOPY WITH PROPOFOL N/A 05/19/2015   Procedure: COLONOSCOPY WITH PROPOFOL;  Surgeon: RManya Silvas MD;  [facility];  Service: Endoscopy;  Laterality: N/A;  . EYE SURGERY    . HAND SURGERY Left   . MASTECTOMY Right 11/07/2016   RESIDUAL INVASIVE MAMMARY CARCINOMA, SUBAREOLAR ANTERIOR TO PREVIOUS   . SENTINEL NODE BIOPSY  Right 11/07/2016   Procedure: SENTINEL NODE BIOPSY;  Surgeon: Robert Bellow, MD;  Location: [facility] ORS;  Service: General;  Laterality: Right;  . SIMPLE MASTECTOMY WITH AXILLARY SENTINEL NODE BIOPSY Right 11/07/2016   Procedure: SIMPLE MASTECTOMY;  Surgeon: Robert Bellow, MD;  Location: [facility] ORS;  Service: General;  Laterality: Right;  . TONSILLECTOMY  AGE 64  . UPPER GI ENDOSCOPY  01/12/06   hiatus hernia   Family History  Problem Relation Age of Onset  . Cancer Mother   . Anemia Mother   . Other Mother        lymphosarcoma  . Alcohol abuse Father   . Prostate cancer Father   . Breast cancer Sister        71; 1/2 sister, shared mother.  . Cancer Sister         bile duct  . Stroke Maternal Grandmother   . Breast cancer Maternal Grandmother 68  . Heart attack Maternal Grandfather   . CAD Paternal Grandfather    Social History   Socioeconomic History  . Marital status: Married    Spouse name: Not on file  . Number of children: 2  . Years of education: Not on file  . Highest education level: Not on file  Occupational History  . Occupation: retired  Scientific laboratory technician  . Financial resource strain: Not hard at all  . Food insecurity:    Worry: Never true    Inability: Never true  . Transportation needs:    Medical: No    Non-medical: No  Tobacco Use  . Smoking status: Never Smoker  . Smokeless tobacco: Never Used  Substance and Sexual Activity  . Alcohol use: Yes    Comment: 0-2 glasses of wine a month  . Drug use: No  . Sexual activity: Not Currently    Birth control/protection: None  Lifestyle  . Physical activity:    Days per week: 0 days    Minutes per session: 0 min  . Stress: Rather much  Relationships  . Social connections:    Talks on phone: Patient refused    Gets together: Patient refused    Attends religious service: Patient refused    Active member of club or organization: Patient refused    Attends meetings of clubs or organizations: Patient refused    Relationship status: Patient refused  Other Topics Concern  . Not on file  Social History Narrative  . Not on file    Outpatient Encounter Medications as of 04/22/2018  Medication Sig  . acetaminophen (TYLENOL) 500 MG tablet Take 500 mg by mouth every 6 (six) hours as needed for mild pain, moderate pain or headache.  . ALPRAZolam (XANAX) 0.5 MG tablet Take 1 tablet (0.5 mg total) by mouth at bedtime.  [name] amLODipine (NORVASC) 5 MG tablet Take 1 tablet (5 mg total) by mouth daily.  . Calcium Carbonate (CALCIUM 600 PO) Take 600 mg by mouth daily at 3 pm.   . carvedilol (COREG) 3.125 MG tablet Take 1 tablet (3.125 mg total) by mouth 2 (two) times daily.  .  Cholecalciferol (VITAMIN D3) 1000 units CAPS Take 1,000 Units by mouth.  . dicyclomine (BENTYL) 10 MG capsule TAKE 1 CAPSULE (10 MG TOTAL) BY MOUTH 3 (THREE) TIMES DAILY BEFORE MEALS.  . hydroxypropyl methylcellulose / hypromellose (ISOPTO TEARS / GONIOVISC) 2.5 % ophthalmic solution Place 1 drop into both eyes 4 (four) times daily as needed for dry eyes (scheduled each morning & then as needed).  [name]  latanoprost (XALATAN) 0.005 % ophthalmic solution INT 1 GTT IN OD 1 TIME IN THE EVE  . letrozole (FEMARA) 2.5 MG tablet TAKE 1 TABLET BY MOUTH EVERY DAY  . levothyroxine (SYNTHROID, LEVOTHROID) 100 MCG tablet TAKE 1 TABLET (100 MCG) BY MOUTH  BEFORE BREAKFAST *30 MINS PRIOR TO BREAKFAST*  . Multiple Vitamin (MULTIVITAMIN WITH MINERALS) TABS tablet Take 1 tablet by mouth daily at 3 pm.  . Omega-3 Fatty Acids (FISH OIL PO) Take 1 capsule by mouth daily.   [name] omeprazole (PRILOSEC) 40 MG capsule Take 1 capsule (40 mg total) by mouth daily.  . polyethylene glycol (MIRALAX / GLYCOLAX) packet Take 17 g by mouth daily.  . sertraline (ZOLOFT) 100 MG tablet Take 1 tablet (100 mg total) by mouth daily.  . Simethicone (GAS-X PO) Take 1 tablet by mouth as needed.   No facility-administered encounter medications on file as of 04/22/2018.     Activities of Daily Living In your present state of health, do you have any difficulty performing the following activities: 04/22/2018  Hearing? Y  Comment Wears bilateral hearing aids.   Vision? N  Comment Wears eye glasses. Struggles seeing when tired.  Difficulty concentrating or making decisions? Y  Walking or climbing stairs? Y  Comment Due to SOB.   Dressing or bathing? N  Doing errands, shopping? N  Preparing Food and eating ? N  Using the Toilet? N  In the past six months, have you accidently leaked urine? Y  Comment Wears protection daily.   Do you have problems with loss of bowel control? N  Managing your Medications? N  Managing your Finances? N    Housekeeping or managing your Housekeeping? N  Some recent data might be hidden    Patient Care Team: Jerrol Banana., MD as PCP - General (Family Medicine) Leandrew Koyanagi, MD as Referring Physician (Ophthalmology) Manya Silvas, MD as Consulting Physician (Gastroenterology) Bary Castilla, Forest Gleason, MD (General Surgery) End, Harrell Gave, MD as Consulting Physician (Cardiology) [facility] [place] as Audiologist    Assessment:   This is a routine wellness examination for Yarnell.  Exercise Activities and Dietary recommendations Current Exercise Habits: The patient does not participate in regular exercise at present, Exercise limited by: Other - see comments(stays busy at all times)  Goals    . DIET - INCREASE WATER INTAKE     Recommend increasing water intake to 4-6 glasses a day.   04/22/18- Recommend increasing water intake to 6-8 glasses a day.     . Increase water intake     Starting 04/07/16, I will increase my water intake to 4-5 glasses a day.       Fall Risk Fall Risk  04/22/2018 06/06/2017 04/18/2017 04/07/2016 04/13/2015  Falls in the past year? 0 No No Yes Yes  Number falls in past yr: - - - 1 1  Injury with Fall? - - - Yes Yes  Comment - - - left foot fracture -  Follow up - - - - Falls prevention discussed   FALL RISK PREVENTION PERTAINING TO THE HOME:  Any stairs in or around the home WITH handrails? Yes  Home free of loose throw rugs in walkways, pet beds, electrical cords, etc? Yes  Adequate lighting in your home to reduce risk of falls? Yes   ASSISTIVE DEVICES UTILIZED TO PREVENT FALLS:  Life alert? No  Use of a cane, walker or w/c? No  Grab bars in the bathroom? No  Shower chair or bench in  shower? No  Elevated toilet seat or a handicapped toilet? No    TIMED UP AND GO:  Was the test performed? No .    Depression Screen PHQ 2/9 Scores 04/22/2018 06/06/2017 04/18/2017 04/18/2017  PHQ - 2 Score 0 0 1 1  PHQ- 9 Score - 1 2 -      Cognitive Function: Declined today.      6CIT Screen 04/07/2016  What Year? 0 points  What month? 0 points  What time? 0 points  Count back from 20 0 points  Months in reverse 0 points  Repeat phrase 0 points  Total Score 0    Immunization History  Administered Date(s) Administered  . Influenza, High Dose Seasonal PF 03/04/2015, 04/07/2016, 03/16/2017, 02/09/2018  . Pneumococcal Conjugate-13 02/05/2014  . Pneumococcal Polysaccharide-23 04/07/2016  . Tdap 03/18/2008    Qualifies for Shingles Vaccine? Yes . Due for Shingrix. Education has been provided regarding the importance of this vaccine. Pt has been advised to call insurance company to determine out of pocket expense. Advised may also receive vaccine at local pharmacy or Health Dept. Verbalized acceptance and understanding.  Tdap: Although this vaccine is not a covered service during a Wellness Exam, does the patient still wish to receive this vaccine today?  Yes .  Education has been provided regarding the importance of this vaccine. Advised may receive this vaccine at local pharmacy or Health Dept. Aware to provide a copy of the vaccination record if obtained from local pharmacy or Health Dept. Verbalized acceptance and understanding.  Flu Vaccine: Up to date  Pneumococcal Vaccine: Up to date   Screening Tests Health Maintenance  Topic Date Due  . TETANUS/TDAP  03/18/2018  . MAMMOGRAM  08/14/2019  . COLONOSCOPY  05/18/2020  . DEXA SCAN  07/16/2022  . INFLUENZA VACCINE  Completed  . Hepatitis C Screening  Completed  . PNA vac Low Risk Adult  Completed    Cancer Screenings:  Colorectal Screening: Completed 05/19/15. Repeat every 5 years.  Mammogram: Completed 08/13/17.   Bone Density: Completed 07/16/17. Results reflect OSTEOPENIA. Repeat every 5 years.   Lung Cancer Screening: (Low Dose CT Chest recommended if Age 76-80 years, 30 pack-year currently smoking OR have quit w/in 15years.) does not qualify.     Additional Screening:  Hepatitis C Screening: Up to date  Vision Screening: Recommended annual ophthalmology exams for early detection of glaucoma and other disorders of the eye.  Dental Screening: Recommended annual dental exams for proper oral hygiene  Community Resource Referral:  CRR required this visit?  No       Plan:  I have personally reviewed and addressed the Medicare Annual Wellness questionnaire and have noted the following in the patient's chart:  A. Medical and social history B. Use of alcohol, tobacco or illicit drugs  C. Current medications and supplements D. Functional ability and status E.  Nutritional status F.  Physical activity G. Advance directives H. List of other physicians I.  Hospitalizations, surgeries, and ER visits in previous 12 months J.  [place] such as hearing and vision if needed, cognitive and depression L. Referrals and appointments - none  In addition, I have reviewed and discussed with patient certain preventive protocols, quality metrics, and best practice recommendations. A written personalized care plan for preventive services as well as general preventive health recommendations were provided to patient.  See attached scanned questionnaire for additional information.   Signed,  Fabio Neighbors, LPN Nurse Health Advisor   Nurse Recommendations: Pt  declined the tetanus vaccine today.

## 2018-04-22 NOTE — Patient Instructions (Addendum)
[name] , Thank you for taking time to come for your Medicare Wellness Visit. I appreciate your ongoing commitment to your health goals. Please review the following plan we discussed and let me know if I can assist you in the future.   Screening recommendations/referrals: Colonoscopy: Up to date, due 04/2020 Mammogram: Up to date, due 07/2019 Bone Density: Up to date 06/2022 Recommended yearly ophthalmology/optometry visit for glaucoma screening and checkup Recommended yearly dental visit for hygiene and checkup  Vaccinations: Influenza vaccine: Up to date Pneumococcal vaccine: Completed series Tdap vaccine: Pt declines today.  Shingles vaccine: Pt declines today.     Advanced directives: Already on file.   Conditions/risks identified: Recommend increasing water intake to 6-8 glasses a day.   Next appointment: 06/12/18 @ 3:00 PM with [name].    Preventive Care 73 Years and Older, Female Preventive care refers to lifestyle choices and visits with your health care provider that can promote health and wellness. What does preventive care include?  A yearly physical exam. This is also called an annual well check.  Dental exams once or twice a year.  Routine eye exams. Ask your health care provider how often you should have your eyes checked.  Personal lifestyle choices, including:  Daily care of your teeth and gums.  Regular physical activity.  Eating a healthy diet.  Avoiding tobacco and drug use.  Limiting alcohol use.  Practicing safe sex.  Taking low-dose aspirin every day.  Taking vitamin and mineral supplements as recommended by your health care provider. What happens during an annual well check? The services and screenings done by your health care provider during your annual well check will depend on your age, overall health, lifestyle risk factors, and family history of disease. Counseling  Your health care provider may ask you questions about  your:  Alcohol use.  Tobacco use.  Drug use.  Emotional well-being.  Home and relationship well-being.  Sexual activity.  Eating habits.  History of falls.  Memory and ability to understand (cognition).  Work and work Statistician.  Reproductive health. Screening  You may have the following tests or measurements:  Height, weight, and BMI.  Blood pressure.  Lipid and cholesterol levels. These may be checked every 5 years, or more frequently if you are over 33 years old.  Skin check.  Lung cancer screening. You may have this screening every year starting at age 40 if you have a 30-pack-year history of smoking and currently smoke or have quit within the past 15 years.  Fecal occult blood test (FOBT) of the stool. You may have this test every year starting at age 67.  Flexible sigmoidoscopy or colonoscopy. You may have a sigmoidoscopy every 5 years or a colonoscopy every 10 years starting at age 24.  Hepatitis C blood test.  Hepatitis B blood test.  Sexually transmitted disease (STD) testing.  Diabetes screening. This is done by checking your blood sugar (glucose) after you have not eaten for a while (fasting). You may have this done every 1-3 years.  Bone density scan. This is done to screen for osteoporosis. You may have this done starting at age 56.  Mammogram. This may be done every 1-2 years. Talk to your health care provider about how often you should have regular mammograms. Talk with your health care provider about your test results, treatment options, and if necessary, the need for more tests. Vaccines  Your health care provider may recommend certain vaccines, such as:  Influenza vaccine. This  is recommended every year.  Tetanus, diphtheria, and acellular pertussis (Tdap, Td) vaccine. You may need a Td booster every 10 years.  Zoster vaccine. You may need this after age 79.  Pneumococcal 13-valent conjugate (PCV13) vaccine. One dose is recommended  after age 27.  Pneumococcal polysaccharide (PPSV23) vaccine. One dose is recommended after age 67. Talk to your health care provider about which screenings and vaccines you need and how often you need them. This information is not intended to replace advice given to you by your health care provider. Make sure you discuss any questions you have with your health care provider. Document Released: 06/11/2015 Document Revised: 02/02/2016 Document Reviewed: 03/16/2015 Elsevier Interactive Patient Education  2017 [place] Prevention in the Home Falls can cause injuries. They can happen to people of all ages. There are many things you can do to make your home safe and to help prevent falls. What can I do on the outside of my home?  Regularly fix the edges of walkways and driveways and fix any cracks.  Remove anything that might make you trip as you walk through a door, such as a raised step or threshold.  Trim any bushes or trees on the path to your home.  Use bright outdoor lighting.  Clear any walking paths of anything that might make someone trip, such as rocks or tools.  Regularly check to see if handrails are loose or broken. Make sure that both sides of any steps have handrails.  Any raised decks and porches should have guardrails on the edges.  Have any leaves, snow, or ice cleared regularly.  Use sand or salt on walking paths during winter.  Clean up any spills in your garage right away. This includes oil or grease spills. What can I do in the bathroom?  Use night lights.  Install grab bars by the toilet and in the tub and shower. Do not use towel bars as grab bars.  Use non-skid mats or decals in the tub or shower.  If you need to sit down in the shower, use a plastic, non-slip stool.  Keep the floor dry. Clean up any water that spills on the floor as soon as it happens.  Remove soap buildup in the tub or shower regularly.  Attach bath mats securely with  double-sided non-slip rug tape.  Do not have throw rugs and other things on the floor that can make you trip. What can I do in the bedroom?  Use night lights.  Make sure that you have a light by your bed that is easy to reach.  Do not use any sheets or blankets that are too big for your bed. They should not hang down onto the floor.  Have a firm chair that has side arms. You can use this for support while you get dressed.  Do not have throw rugs and other things on the floor that can make you trip. What can I do in the kitchen?  Clean up any spills right away.  Avoid walking on wet floors.  Keep items that you use a lot in easy-to-reach places.  If you need to reach something above you, use a strong step stool that has a grab bar.  Keep electrical cords out of the way.  Do not use floor polish or wax that makes floors slippery. If you must use wax, use non-skid floor wax.  Do not have throw rugs and other things on the floor that can make you  trip. What can I do with my stairs?  Do not leave any items on the stairs.  Make sure that there are handrails on both sides of the stairs and use them. Fix handrails that are broken or loose. Make sure that handrails are as long as the stairways.  Check any carpeting to make sure that it is firmly attached to the stairs. Fix any carpet that is loose or worn.  Avoid having throw rugs at the top or bottom of the stairs. If you do have throw rugs, attach them to the floor with carpet tape.  Make sure that you have a light switch at the top of the stairs and the bottom of the stairs. If you do not have them, ask someone to add them for you. What else can I do to help prevent falls?  Wear shoes that:  Do not have high heels.  Have rubber bottoms.  Are comfortable and fit you well.  Are closed at the toe. Do not wear sandals.  If you use a stepladder:  Make sure that it is fully opened. Do not climb a closed stepladder.  Make  sure that both sides of the stepladder are locked into place.  Ask someone to hold it for you, if possible.  Clearly mark and make sure that you can see:  Any grab bars or handrails.  First and last steps.  Where the edge of each step is.  Use tools that help you move around (mobility aids) if they are needed. These include:  Canes.  Walkers.  Scooters.  Crutches.  Turn on the lights when you go into a dark area. Replace any light bulbs as soon as they burn out.  Set up your furniture so you have a clear path. Avoid moving your furniture around.  If any of your floors are uneven, fix them.  If there are any pets around you, be aware of where they are.  Review your medicines with your doctor. Some medicines can make you feel dizzy. This can increase your chance of falling. Ask your doctor what other things that you can do to help prevent falls. This information is not intended to replace advice given to you by your health care provider. Make sure you discuss any questions you have with your health care provider. Document Released: 03/11/2009 Document Revised: 10/21/2015 Document Reviewed: 06/19/2014 Elsevier Interactive Patient Education  2017 [employer].

## 2018-04-29 ENCOUNTER — Encounter: Payer: Self-pay | Admitting: Family Medicine

## 2018-05-01 ENCOUNTER — Other Ambulatory Visit: Payer: Self-pay | Admitting: Internal Medicine

## 2018-05-01 NOTE — Telephone Encounter (Signed)
°*  STAT* If patient is at the pharmacy, call can be transferred to refill team.   1. Which medications need to be refilled? (please list name of each medication and dose if known) Coreg 3.125 mg po BID   2. Which pharmacy/location (including street and city if local pharmacy) is medication to be sent to? [facility]   3. Do they need a 30 day or 90 day supply? [place]

## 2018-05-02 ENCOUNTER — Other Ambulatory Visit: Payer: Self-pay

## 2018-05-02 MED ORDER — CARVEDILOL 3.125 MG PO TABS: 3.1250 mg | ORAL_TABLET | Freq: Two times a day (BID) | ORAL | 1 refills | Status: DC

## 2018-05-02 NOTE — Telephone Encounter (Signed)
Rx sent to Pharmacy for Coreg

## 2018-05-15 ENCOUNTER — Other Ambulatory Visit: Payer: Self-pay | Admitting: Internal Medicine

## 2018-06-11 ENCOUNTER — Other Ambulatory Visit: Payer: Self-pay | Admitting: Family Medicine

## 2018-06-11 NOTE — Telephone Encounter (Signed)
[facility] faxed refill request for the following medications:  1. levothyroxine (SYNTHROID, LEVOTHROID) 100 MCG tablet 2. carvedilol (COREG) 3.125 MG tablet 3. letrozole (FEMARA) 2.5 MG tablet 4. sertraline (ZOLOFT) 100 MG tablet 5. amLODipine (NORVASC) 5 MG tablet 6. dicyclomine (BENTYL) 10 MG capsule  2, 3, 5, & 6 look like they were written by another provider.  LOV: 01/29/18 Please advise. Thanks TNP

## 2018-06-12 ENCOUNTER — Encounter: Payer: Self-pay | Admitting: Family Medicine

## 2018-06-12 ENCOUNTER — Ambulatory Visit (INDEPENDENT_AMBULATORY_CARE_PROVIDER_SITE_OTHER): Payer: Medicare Other | Admitting: Family Medicine

## 2018-06-12 VITALS — BP 126/74 | HR 62 | Temp 98.2°F | Resp 16 | Wt 131.2 lb

## 2018-06-12 DIAGNOSIS — R5383 Other fatigue: Secondary | ICD-10-CM

## 2018-06-12 DIAGNOSIS — Z0000 Encounter for general adult medical examination without abnormal findings: Secondary | ICD-10-CM | POA: Diagnosis not present

## 2018-06-12 DIAGNOSIS — F411 Generalized anxiety disorder: Secondary | ICD-10-CM | POA: Diagnosis not present

## 2018-06-12 DIAGNOSIS — I10 Essential (primary) hypertension: Secondary | ICD-10-CM

## 2018-06-12 DIAGNOSIS — E7849 Other hyperlipidemia: Secondary | ICD-10-CM | POA: Diagnosis not present

## 2018-06-12 DIAGNOSIS — R0602 Shortness of breath: Secondary | ICD-10-CM | POA: Diagnosis not present

## 2018-06-12 MED ORDER — SERTRALINE HCL 100 MG PO TABS: 100.0000 mg | ORAL_TABLET | Freq: Every day | ORAL | 3 refills | Status: DC

## 2018-06-12 MED ORDER — AMLODIPINE BESYLATE 5 MG PO TABS: 5.0000 mg | ORAL_TABLET | Freq: Every day | ORAL | 2 refills | Status: DC

## 2018-06-12 MED ORDER — DICYCLOMINE HCL 10 MG PO CAPS
ORAL_CAPSULE | ORAL | 3 refills | Status: DC
Start: 2018-06-12 — End: 2019-05-26

## 2018-06-12 MED ORDER — LETROZOLE 2.5 MG PO TABS: 2.5000 mg | ORAL_TABLET | Freq: Every day | ORAL | 1 refills | Status: DC

## 2018-06-12 MED ORDER — LEVOTHYROXINE SODIUM 100 MCG PO TABS: ORAL_TABLET | ORAL | 3 refills | Status: DC

## 2018-06-12 MED ORDER — CARVEDILOL 3.125 MG PO TABS: 3.1250 mg | ORAL_TABLET | Freq: Two times a day (BID) | ORAL | 1 refills | Status: DC

## 2018-06-12 NOTE — Progress Notes (Signed)
Patient: Sandra [name], Female    DOB: [birth year], 73 y.o.   MRN: [id number] Visit Date: 06/12/2018  Today's Provider: [name], MD   Chief Complaint  Patient presents with  . Annual Exam   Subjective:  Patient had AWV  With McKenzie on 04/22/2018.    Annual physical visit Sandra [name] is a 73 y.o. female. She feels well. She reports exercising not regularly, but she does stay active. She reports she is sleeping well.   Mammogram- 08/13/2017. F/B Dr. Bary Castilla. S/P mastectomy in right breast.  Colonoscopy- 05/19/2015. Polyps removed. Repeat ? BMD- 07/16/2017. Osteopenia. Repeat 2 years.   Patient also mentions arthritis in her right thumb. Pain is worse in the mornings.   Review of Systems  Constitutional: Positive for activity change and fatigue.  HENT: Negative.   Eyes: Negative.   Respiratory: Negative for cough and shortness of breath.   Cardiovascular: Negative for chest pain, palpitations and leg swelling.  Gastrointestinal: Negative.   Endocrine: Negative.   Genitourinary: Negative.   Musculoskeletal: Positive for arthralgias.  Skin: Negative.   Allergic/Immunologic: Negative.   Neurological: Positive for weakness and headaches. Negative for dizziness and light-headedness.  Hematological: Negative.   Psychiatric/Behavioral: Negative for agitation, self-injury and sleep disturbance. The patient is not nervous/anxious and is not hyperactive.     Social History   Socioeconomic History  . Marital status: Married    Spouse name: Not on file  . Number of children: 2  . Years of education: Not on file  . Highest education level: Not on file  Occupational History  . Occupation: retired  Scientific laboratory technician  . Financial resource strain: Not hard at all  . Food insecurity:    Worry: Never true    Inability: Never true  . Transportation needs:    Medical: No    Non-medical: No  Tobacco Use  . Smoking status: Never Smoker  . Smokeless tobacco: Never  Used  Substance and Sexual Activity  . Alcohol use: Yes    Comment: 0-2 glasses of wine a month  . Drug use: No  . Sexual activity: Not Currently    Birth control/protection: None  Lifestyle  . Physical activity:    Days per week: 0 days    Minutes per session: 0 min  . Stress: Rather much  Relationships  . Social connections:    Talks on phone: Patient refused    Gets together: Patient refused    Attends religious service: Patient refused    Active member of club or organization: Patient refused    Attends meetings of clubs or organizations: Patient refused    Relationship status: Patient refused  . Intimate partner violence:    Fear of current or ex partner: Patient refused    Emotionally abused: Patient refused    Physically abused: Patient refused    Forced sexual activity: Patient refused  Other Topics Concern  . Not on file  Social History Narrative  . Not on file    Past Medical History:  Diagnosis Date  . Abnormality of left ventricle of heart    MILD-MODERATE THICKENING PER ECHO ON 01-18-17  . Anemia   . Anxiety   . Arthritis   . BRCA gene mutation negative 10/2016   [facility]  . Breast cancer ([place]) 10/16/2016   T1c,N0;, GRADE I/III, 1.6cm. ER/PR pos  HER2 not over expressed, Right Upper Outer  . Celiac disease   . Depression   . Diastolic dysfunction   .  Dyspnea    WITH EXERTION  . GERD (gastroesophageal reflux disease)    OCC  . Glaucoma    right eye  . Headache    H/O MIGRAINES AS TEENAGER  . Heart murmur    PT WAS TOLD ONCE SHE HAD MVP AND THEN ANOTHER MD SAID SHE DID NOT HAVE MVP  . Hypothyroidism   . Pneumonia    YEARS AGO  . Raynaud's disease   . RLS (restless legs syndrome)      Patient Active Problem List   Diagnosis Date Noted  . Chronic diastolic heart failure ([place]) 05/17/2017  . Essential hypertension 05/17/2017  . Glaucoma (increased eye pressure) 02/06/2017  . Invasive ductal carcinoma of breast, female, right ([place]) 10/20/2016  .  Prolapse of vaginal vault after hysterectomy 08/24/2016  . Vaginal atrophy 08/24/2016  . Dyspareunia in female 08/24/2016  . Colon polyps 01/06/2015  . BCC (basal cell carcinoma of skin) 01/06/2015  . Hypothyroidism 01/06/2015  . Vitamin D deficiency 01/06/2015  . Hyperlipidemia 01/06/2015  . Depression, major 01/06/2015  . GAD (generalized anxiety disorder) 01/06/2015  . RLS (restless legs syndrome) 01/06/2015  . Cataract 01/06/2015  . Raynaud phenomenon 01/06/2015  . GERD (gastroesophageal reflux disease) 01/06/2015  . Celiac disease 01/06/2015  . OA (osteoarthritis) 01/06/2015  . Osteoporosis 01/06/2015  . Pre-diabetes 01/06/2015  . Avitaminosis D 01/06/2015  . Raynaud's syndrome without gangrene 01/06/2015  . Age-related osteoporosis without current pathological fracture 01/06/2015  . Anxiety disorder 01/05/2015    Past Surgical History:  Procedure Laterality Date  . ABDOMINAL HYSTERECTOMY    . APPENDECTOMY    . AXILLARY LYMPH NODE BIOPSY Right 03/05/2017   Procedure: AXILLARY LYMPH NODE BIOPSY;  Surgeon: Robert Bellow, MD;  Location: [facility] ORS;  Service: General;  Laterality: Right;  . CATARACT EXTRACTION Bilateral   . CESAREAN SECTION    . COLONOSCOPY WITH PROPOFOL N/A 05/19/2015   Procedure: COLONOSCOPY WITH PROPOFOL;  Surgeon: Manya Silvas, MD;  Location: [facility] ENDOSCOPY;  Service: Endoscopy;  Laterality: N/A;  . EYE SURGERY    . HAND SURGERY Left   . MASTECTOMY Right 11/07/2016   RESIDUAL INVASIVE MAMMARY CARCINOMA, SUBAREOLAR ANTERIOR TO PREVIOUS   . SENTINEL NODE BIOPSY Right 11/07/2016   Procedure: SENTINEL NODE BIOPSY;  Surgeon: Robert Bellow, MD;  Location: [facility] ORS;  Service: General;  Laterality: Right;  . SIMPLE MASTECTOMY WITH AXILLARY SENTINEL NODE BIOPSY Right 11/07/2016   Procedure: SIMPLE MASTECTOMY;  Surgeon: Robert Bellow, MD;  Location: [facility] ORS;  Service: General;  Laterality: Right;  . TONSILLECTOMY  AGE 5  . UPPER GI ENDOSCOPY   01/12/06   hiatus hernia    Her family history includes Alcohol abuse in her father; Anemia in her mother; Breast cancer in her sister; Breast cancer (age of onset: 50) in her maternal grandmother; CAD in her paternal grandfather; Cancer in her mother and sister; Heart attack in her maternal grandfather; Other in her mother; Prostate cancer in her father; Stroke in her maternal grandmother.      Current Outpatient Medications:  .  acetaminophen (TYLENOL) 500 MG tablet, Take 500 mg by mouth every 6 (six) hours as needed for mild pain, moderate pain or headache., Disp: , Rfl:  .  ALPRAZolam (XANAX) 0.5 MG tablet, Take 1 tablet (0.5 mg total) by mouth at bedtime., Disp: 90 tablet, Rfl: 1 .  amLODipine (NORVASC) 5 MG tablet, Take 1 tablet (5 mg total) by mouth daily., Disp: 90 tablet, Rfl: 2 .  Calcium  Carbonate (CALCIUM 600 PO), Take 600 mg by mouth daily at 3 pm. , Disp: , Rfl:  .  carvedilol (COREG) 3.125 MG tablet, Take 1 tablet (3.125 mg total) by mouth 2 (two) times daily., Disp: 180 tablet, Rfl: 1 .  Cholecalciferol (VITAMIN D3) 1000 units CAPS, Take 1,000 Units by mouth., Disp: , Rfl:  .  dicyclomine (BENTYL) 10 MG capsule, TAKE 1 CAPSULE (10 MG TOTAL) BY MOUTH 3 (THREE) TIMES DAILY BEFORE MEALS., Disp: 270 capsule, Rfl: 3 .  hydroxypropyl methylcellulose / hypromellose (ISOPTO TEARS / GONIOVISC) 2.5 % ophthalmic solution, Place 1 drop into both eyes 4 (four) times daily as needed for dry eyes (scheduled each morning & then as needed)., Disp: , Rfl:  .  latanoprost (XALATAN) 0.005 % ophthalmic solution, INT 1 GTT IN OD 1 TIME IN THE EVE, Disp: , Rfl: 5 .  letrozole (FEMARA) 2.5 MG tablet, Take 1 tablet (2.5 mg total) by mouth daily., Disp: 90 tablet, Rfl: 1 .  levothyroxine (SYNTHROID, LEVOTHROID) 100 MCG tablet, TAKE 1 TABLET (100 MCG) BY MOUTH  BEFORE BREAKFAST *30 MINS PRIOR TO BREAKFAST*, Disp: 90 tablet, Rfl: 3 .  Multiple Vitamin (MULTIVITAMIN WITH MINERALS) TABS tablet, Take 1 tablet by  mouth daily at 3 pm., Disp: , Rfl:  .  Omega-3 Fatty Acids (FISH OIL PO), Take 1 capsule by mouth daily. , Disp: , Rfl:  .  omeprazole (PRILOSEC) 40 MG capsule, Take 1 capsule (40 mg total) by mouth daily., Disp: 90 capsule, Rfl: 3 .  polyethylene glycol (MIRALAX / GLYCOLAX) packet, Take 17 g by mouth daily., Disp: , Rfl:  .  sertraline (ZOLOFT) 100 MG tablet, Take 1 tablet (100 mg total) by mouth daily., Disp: 90 tablet, Rfl: 3 .  Simethicone (GAS-X PO), Take 1 tablet by mouth as needed., Disp: , Rfl:   Patient Care Team: Jerrol Banana., MD as PCP - General (Family Medicine) Leandrew Koyanagi, MD as Referring Physician (Ophthalmology) Manya Silvas, MD as Consulting Physician (Gastroenterology) Bary Castilla Forest Gleason, MD (General Surgery) End, Harrell Gave, MD as Consulting Physician (Cardiology) [facility] [place] as Audiologist     Objective:   Vitals: BP 126/74 (BP Location: Left Arm, Patient Position: Sitting, Cuff Size: Normal)   Pulse 62   Temp 98.2 F (36.8 C)   Resp 16   Wt 131 lb 3.2 oz (59.5 kg)   SpO2 100%   BMI 21.18 kg/m   Physical Exam Constitutional:      Appearance: Normal appearance. She is normal weight.  HENT:     Head: Normocephalic.     Right Ear: External ear normal.     Left Ear: External ear normal.     Nose: Nose normal.     Mouth/Throat:     Pharynx: Oropharynx is clear.  Eyes:     General: No scleral icterus. Cardiovascular:     Rate and Rhythm: Normal rate and regular rhythm.     Pulses: Normal pulses.     Heart sounds: Normal heart sounds.  Pulmonary:     Breath sounds: Normal breath sounds.  Abdominal:     Palpations: Abdomen is soft.  Skin:    General: Skin is warm and dry.  Neurological:     General: No focal deficit present.     Mental Status: She is alert and oriented to person, place, and time. Mental status is at baseline.  Psychiatric:        Mood and Affect: Mood normal.  Behavior: Behavior normal.         Thought Content: Thought content normal.        Judgment: Judgment normal.     Activities of Daily Living In your present state of health, do you have any difficulty performing the following activities: 04/22/2018  Hearing? Y  Comment Wears bilateral hearing aids.   Vision? N  Comment Wears eye glasses. Struggles seeing when tired.  Difficulty concentrating or making decisions? Y  Walking or climbing stairs? Y  Comment Due to SOB.   Dressing or bathing? N  Doing errands, shopping? N  Preparing Food and eating ? N  Using the Toilet? N  In the past six months, have you accidently leaked urine? Y  Comment Wears protection daily.   Do you have problems with loss of bowel control? N  Managing your Medications? N  Managing your Finances? N  Housekeeping or managing your Housekeeping? N  Some recent data might be hidden    Fall Risk Assessment Fall Risk  04/22/2018 06/06/2017 04/18/2017 04/07/2016 04/13/2015  Falls in the past year? 0 No No Yes Yes  Number falls in past yr: - - - 1 1  Injury with Fall? - - - Yes Yes  Comment - - - left foot fracture -  Follow up - - - - Falls prevention discussed     Depression Screen PHQ 2/9 Scores 04/22/2018 06/06/2017 04/18/2017 04/18/2017  PHQ - 2 Score 0 0 1 1  PHQ- 9 Score - 1 2 -    6CIT Screen 04/07/2016  What Year? 0 points  What month? 0 points  What time? 0 points  Count back from 20 0 points  Months in reverse 0 points  Repeat phrase 0 points  Total Score 0   ECG with possible ST changes laterally vs LV strain.   Assessment & Plan:     Annual Wellness Visit  Reviewed patient's Family Medical History Reviewed and updated list of patient's medical providers Assessment of cognitive impairment was done Assessed patient's functional ability Established a written schedule for health screening [place] Completed and Reviewed  Exercise Activities and Dietary recommendations Goals    . DIET -  INCREASE WATER INTAKE     Recommend increasing water intake to 4-6 glasses a day.   04/22/18- Recommend increasing water intake to 6-8 glasses a day.     . Increase water intake     Starting 04/07/16, I will increase my water intake to 4-5 glasses a day.       Immunization History  Administered Date(s) Administered  . Influenza, High Dose Seasonal PF 03/04/2015, 04/07/2016, 03/16/2017, 02/09/2018  . Pneumococcal Conjugate-13 02/05/2014  . Pneumococcal Polysaccharide-23 04/07/2016  . Tdap 03/18/2008    Health Maintenance  Topic Date Due  . TETANUS/TDAP  03/18/2018  . MAMMOGRAM  08/14/2019  . COLONOSCOPY  05/18/2020  . DEXA SCAN  07/16/2022  . INFLUENZA VACCINE  Completed  . Hepatitis C Screening  Completed  . PNA vac Low Risk Adult  Completed     Discussed health benefits of physical activity, and encouraged her to engage in regular exercise appropriate for her age and condition.  1. Annual physical exam   2. Shortness of breath Nonspecific and likely deconditioning.Consider cardiology referral with borderline/abnormal  ECG - EKG 12-Lead  3. Fatigue, unspecified type   4. Generalized anxiety disorder Chronic--stable.  5. Other hyperlipidemia  - Lipid panel - TSH  6. Essential hypertension  - CBC with Differential/Platelet -  Comprehensive metabolic panel  I have done the exam and reviewed the above chart and it is accurate to the best of my knowledge. Development worker, community has been used in this note in any air is in the dictation or transcription are unintentional.  [name], MD  [place]

## 2018-06-13 DIAGNOSIS — E7849 Other hyperlipidemia: Secondary | ICD-10-CM | POA: Diagnosis not present

## 2018-06-13 DIAGNOSIS — I10 Essential (primary) hypertension: Secondary | ICD-10-CM | POA: Diagnosis not present

## 2018-06-14 LAB — TSH: TSH: 1.99 u[IU]/mL (ref 0.450–4.500)

## 2018-06-14 LAB — COMPREHENSIVE METABOLIC PANEL
ALT: 32 IU/L (ref 0–32)
AST: 22 IU/L (ref 0–40)
Albumin/Globulin Ratio: 1.9 (ref 1.2–2.2)
Albumin: 4.1 g/dL (ref 3.5–4.8)
Alkaline Phosphatase: 75 IU/L (ref 39–117)
BUN/Creatinine Ratio: 21 (ref 12–28)
BUN: 13 mg/dL (ref 8–27)
Bilirubin Total: 0.3 mg/dL (ref 0.0–1.2)
CO2: 24 mmol/L (ref 20–29)
Calcium: 9.3 mg/dL (ref 8.7–10.3)
Chloride: 101 mmol/L (ref 96–106)
Creatinine, Ser: 0.62 mg/dL (ref 0.57–1.00)
GFR calc Af Amer: 103 mL/min/{1.73_m2} (ref >59)
GFR calc non Af Amer: 90 mL/min/{1.73_m2} (ref >59)
Globulin, Total: 2.2 g/dL (ref 1.5–4.5)
Glucose: 116 mg/dL — ABNORMAL HIGH (ref 65–99)
Potassium: 3.8 mmol/L (ref 3.5–5.2)
Sodium: 141 mmol/L (ref 134–144)
Total Protein: 6.3 g/dL (ref 6.0–8.5)

## 2018-06-14 LAB — LIPID PANEL
Chol/HDL Ratio: 3.9 ratio (ref 0.0–4.4)
Cholesterol, Total: 225 mg/dL — ABNORMAL HIGH (ref 100–199)
HDL: 57 mg/dL (ref >39)
LDL Calculated: 136 mg/dL — ABNORMAL HIGH (ref 0–99)
Triglycerides: 160 mg/dL — ABNORMAL HIGH (ref 0–149)
VLDL Cholesterol Cal: 32 mg/dL (ref 5–40)

## 2018-06-14 LAB — CBC WITH DIFFERENTIAL/PLATELET
Basophils Absolute: 0 10*3/uL (ref 0.0–0.2)
Basos: 0 %
EOS (ABSOLUTE): 0 10*3/uL (ref 0.0–0.4)
Eos: 1 %
Hematocrit: 35.2 % (ref 34.0–46.6)
Hemoglobin: 12 g/dL (ref 11.1–15.9)
Immature Grans (Abs): 0 10*3/uL (ref 0.0–0.1)
Immature Granulocytes: 0 %
Lymphocytes Absolute: 1.5 10*3/uL (ref 0.7–3.1)
Lymphs: 32 %
MCH: 28.9 pg (ref 26.6–33.0)
MCHC: 34.1 g/dL (ref 31.5–35.7)
MCV: 85 fL (ref 79–97)
Monocytes Absolute: 0.5 10*3/uL (ref 0.1–0.9)
Monocytes: 10 %
Neutrophils Absolute: 2.6 10*3/uL (ref 1.4–7.0)
Neutrophils: 57 %
Platelets: 251 10*3/uL (ref 150–450)
RBC: 4.15 x10E6/uL (ref 3.77–5.28)
RDW: 13.1 % (ref 11.7–15.4)
WBC: 4.6 10*3/uL (ref 3.4–10.8)

## 2018-06-17 ENCOUNTER — Encounter: Payer: Self-pay | Admitting: Family Medicine

## 2018-06-24 ENCOUNTER — Encounter: Payer: Self-pay | Admitting: Family Medicine

## 2018-06-24 ENCOUNTER — Other Ambulatory Visit: Payer: Self-pay | Admitting: Family Medicine

## 2018-06-24 ENCOUNTER — Ambulatory Visit (INDEPENDENT_AMBULATORY_CARE_PROVIDER_SITE_OTHER): Payer: Medicare Other | Admitting: Family Medicine

## 2018-06-24 ENCOUNTER — Ambulatory Visit
Admission: RE | Admit: 2018-06-24 | Discharge: 2018-06-24 | Disposition: A | Discharge status: Home / Self Care | Payer: Medicare Other | Source: Ambulatory Visit | Attending: Family Medicine | Admitting: Family Medicine

## 2018-06-24 ENCOUNTER — Ambulatory Visit
Admission: RE | Admit: 2018-06-24 | Discharge: 2018-06-24 | Disposition: A | Discharge status: Home / Self Care | Payer: Medicare Other | Attending: Family Medicine | Admitting: Family Medicine

## 2018-06-24 ENCOUNTER — Telehealth: Payer: Self-pay

## 2018-06-24 VITALS — BP 130/74 | HR 77 | Temp 100.5°F | Wt 132.8 lb

## 2018-06-24 DIAGNOSIS — R05 Cough: Secondary | ICD-10-CM

## 2018-06-24 DIAGNOSIS — R0989 Other specified symptoms and signs involving the circulatory and respiratory systems: Secondary | ICD-10-CM | POA: Insufficient documentation

## 2018-06-24 DIAGNOSIS — R509 Fever, unspecified: Secondary | ICD-10-CM

## 2018-06-24 DIAGNOSIS — R6883 Chills (without fever): Secondary | ICD-10-CM | POA: Insufficient documentation

## 2018-06-24 DIAGNOSIS — R059 Cough, unspecified: Secondary | ICD-10-CM

## 2018-06-24 DIAGNOSIS — F419 Anxiety disorder, unspecified: Secondary | ICD-10-CM

## 2018-06-24 DIAGNOSIS — R0602 Shortness of breath: Secondary | ICD-10-CM | POA: Diagnosis not present

## 2018-06-24 LAB — POCT INFLUENZA A/B
Influenza A, POC: NEGATIVE
Influenza B, POC: NEGATIVE

## 2018-06-24 MED ORDER — DOXYCYCLINE HYCLATE 100 MG PO TABS: 100.0000 mg | ORAL_TABLET | Freq: Two times a day (BID) | ORAL | 0 refills | Status: AC

## 2018-06-24 MED ORDER — AMOXICILLIN-POT CLAVULANATE 875-125 MG PO TABS: 1.0000 | ORAL_TABLET | Freq: Two times a day (BID) | ORAL | 0 refills | Status: AC

## 2018-06-24 MED ORDER — ALPRAZOLAM 0.5 MG PO TABS: 0.5000 mg | ORAL_TABLET | Freq: Every day | ORAL | 1 refills | Status: DC

## 2018-06-24 NOTE — Progress Notes (Signed)
Patient: Sandra [name] Female    DOB: 19-Nov-1945   73 y.o.   MRN: [id number] Visit Date: 06/25/2018  Today's Provider: Lavon Paganini, MD   Chief Complaint  Patient presents with  . URI   Subjective:    I, Tiburcio Pea, CMA, am acting as a scribe for Lavon Paganini, MD.   URI   This is a new problem. Episode onset: Saturday. The problem has been gradually worsening. The maximum temperature recorded prior to her arrival was 102 - 102.9 F. The fever has been present for 3 to 4 days. Associated symptoms include congestion, coughing, headaches, neck pain and a sore throat. Associated symptoms comments: Fever, SOB  . Treatments tried: Delsym, Mucinex Fast Max, and Vicks Vapor. The treatment provided mild relief.   Ribs are sore from coughing    Allergies  Allergen Reactions  . Benadryl [Diphenhydramine Hcl (Sleep)] Other (See Comments)    Hyperactivity/causes opposite effect  . Advil [Ibuprofen] Rash    CAN TAKE NAPROSYN  . Claritin [Loratadine] Rash     Current Outpatient Medications:  .  acetaminophen (TYLENOL) 500 MG tablet, Take 500 mg by mouth every 6 (six) hours as needed for mild pain, moderate pain or headache., Disp: , Rfl:  .  ALPRAZolam (XANAX) 0.5 MG tablet, Take 1 tablet (0.5 mg total) by mouth at bedtime., Disp: 90 tablet, Rfl: 1 .  amLODipine (NORVASC) 5 MG tablet, Take 1 tablet (5 mg total) by mouth daily., Disp: 90 tablet, Rfl: 2 .  Calcium Carbonate (CALCIUM 600 PO), Take 600 mg by mouth daily at 3 pm. , Disp: , Rfl:  .  carvedilol (COREG) 3.125 MG tablet, Take 1 tablet (3.125 mg total) by mouth 2 (two) times daily., Disp: 180 tablet, Rfl: 1 .  Cholecalciferol (VITAMIN D3) 1000 units CAPS, Take 1,000 Units by mouth., Disp: , Rfl:  .  dicyclomine (BENTYL) 10 MG capsule, TAKE 1 CAPSULE (10 MG TOTAL) BY MOUTH 3 (THREE) TIMES DAILY BEFORE MEALS., Disp: 270 capsule, Rfl: 3 .  hydroxypropyl methylcellulose / hypromellose (ISOPTO TEARS / GONIOVISC) 2.5 %  ophthalmic solution, Place 1 drop into both eyes 4 (four) times daily as needed for dry eyes (scheduled each morning & then as needed)., Disp: , Rfl:  .  latanoprost (XALATAN) 0.005 % ophthalmic solution, INT 1 GTT IN OD 1 TIME IN THE EVE, Disp: , Rfl: 5 .  letrozole (FEMARA) 2.5 MG tablet, Take 1 tablet (2.5 mg total) by mouth daily., Disp: 90 tablet, Rfl: 1 .  levothyroxine (SYNTHROID, LEVOTHROID) 100 MCG tablet, TAKE 1 TABLET (100 MCG) BY MOUTH  BEFORE BREAKFAST *30 MINS PRIOR TO BREAKFAST*, Disp: 90 tablet, Rfl: 3 .  Multiple Vitamin (MULTIVITAMIN WITH MINERALS) TABS tablet, Take 1 tablet by mouth daily at 3 pm., Disp: , Rfl:  .  Omega-3 Fatty Acids (FISH OIL PO), Take 1 capsule by mouth daily. , Disp: , Rfl:  .  omeprazole (PRILOSEC) 40 MG capsule, Take 1 capsule (40 mg total) by mouth daily., Disp: 90 capsule, Rfl: 3 .  polyethylene glycol (MIRALAX / GLYCOLAX) packet, Take 17 g by mouth daily., Disp: , Rfl:  .  sertraline (ZOLOFT) 100 MG tablet, Take 1 tablet (100 mg total) by mouth daily., Disp: 90 tablet, Rfl: 3 .  Simethicone (GAS-X PO), Take 1 tablet by mouth as needed., Disp: , Rfl:  .  amoxicillin-clavulanate (AUGMENTIN) 875-125 MG tablet, Take 1 tablet by mouth 2 (two) times daily for 7 days., Disp: 14 tablet, Rfl:  0 .  doxycycline (VIBRA-TABS) 100 MG tablet, Take 1 tablet (100 mg total) by mouth 2 (two) times daily for 7 days., Disp: 14 tablet, Rfl: 0  Review of Systems  Constitutional: Positive for chills and fever.  HENT: Positive for congestion and sore throat.   Respiratory: Positive for cough and shortness of breath.   Cardiovascular: Negative.   Musculoskeletal: Positive for neck pain.  Neurological: Positive for headaches.    Social History   Tobacco Use  . Smoking status: Never Smoker  . Smokeless tobacco: Never Used  Substance Use Topics  . Alcohol use: Yes    Comment: 0-2 glasses of wine a month      Objective:   BP 130/74 (BP Location: Right Arm, Patient  Position: Sitting, Cuff Size: Normal)   Pulse 77   Temp (!) 100.5 F (38.1 C) (Oral)   Wt 132 lb 12.8 oz (60.2 kg)   SpO2 97%   BMI 21.43 kg/m  Vitals:   06/24/18 1337  BP: 130/74  Pulse: 77  Temp: (!) 100.5 F (38.1 C)  TempSrc: Oral  SpO2: 97%  Weight: 132 lb 12.8 oz (60.2 kg)     Physical Exam Vitals signs reviewed.  Constitutional:      General: She is not in acute distress.    Appearance: She is well-developed. She is ill-appearing. She is not toxic-appearing or diaphoretic.  HENT:     Head: Normocephalic and atraumatic.     Right Ear: Tympanic membrane, ear canal and external ear normal.     Left Ear: Tympanic membrane, ear canal and external ear normal.     Nose: Nose normal.     Mouth/Throat:     Mouth: Mucous membranes are moist.     Pharynx: Oropharynx is clear. No oropharyngeal exudate.  Eyes:     General: No scleral icterus.    Extraocular Movements: Extraocular movements intact.     Conjunctiva/sclera: Conjunctivae normal.     Pupils: Pupils are equal, round, and reactive to light.  Neck:     Musculoskeletal: Neck supple.     Thyroid: No thyromegaly.  Cardiovascular:     Rate and Rhythm: Normal rate and regular rhythm.     Pulses: Normal pulses.     Heart sounds: Normal heart sounds. No murmur.  Pulmonary:     Effort: Pulmonary effort is normal. No respiratory distress.     Breath sounds: No decreased air movement. Examination of the left-middle field reveals rales. Examination of the left-lower field reveals rales. Rales present. No wheezing.  Abdominal:     General: There is no distension.     Palpations: Abdomen is soft.     Tenderness: There is no abdominal tenderness.  Musculoskeletal:     Right lower leg: No edema.     Left lower leg: No edema.  Lymphadenopathy:     Cervical: No cervical adenopathy.  Skin:    General: Skin is warm and dry.     Capillary Refill: Capillary refill takes less than 2 seconds.     Findings: No rash.    Neurological:     Mental Status: She is alert and oriented to person, place, and time. Mental status is at baseline.  Psychiatric:        Mood and Affect: Mood normal.        Behavior: Behavior normal.        Thought Content: Thought content normal.      Results for orders placed or performed in visit on  06/24/18  POCT Influenza A/B  Result Value Ref Range   Influenza A, POC Negative Negative   Influenza B, POC Negative Negative       Assessment & Plan   1. Fever, unspecified fever cause 2. Chills 3. Abnormal lung sounds 4. Cough - concern for CAP given cough, fever, and abnormal focal lung exam - will get CXR to confirm - start treatment with Augmentin and Doxycycline - stable and non-toxic currently, but discussed strict return precautions - discussed symptomatic treatment as well with mucinex, delsym, tylenol/ibuprofen - DG Chest 2 View; Future    Meds ordered this encounter  Medications  . amoxicillin-clavulanate (AUGMENTIN) 875-125 MG tablet    Sig: Take 1 tablet by mouth 2 (two) times daily for 7 days.    Dispense:  14 tablet    Refill:  0  . doxycycline (VIBRA-TABS) 100 MG tablet    Sig: Take 1 tablet (100 mg total) by mouth 2 (two) times daily for 7 days.    Dispense:  14 tablet    Refill:  0     Return if symptoms worsen or fail to improve.   The entirety of the information documented in the History of Present Illness, Review of Systems and Physical Exam were personally obtained by me. Portions of this information were initially documented by Tiburcio Pea, CMA and reviewed by me for thoroughness and accuracy.    Virginia Crews, MD, MPH [facility] 06/25/2018 10:47 AM

## 2018-06-24 NOTE — Telephone Encounter (Signed)
appt today with Dr. Jacinto Reap

## 2018-06-24 NOTE — Patient Instructions (Addendum)
Robitussin and Mucinex daytime have the same ingredients.  Do not take together.  Ok to take with nighttime Mucinex.  Can use Delsym and honey as needed for cough  Start Antibiotics - augmentin and doxycycline twice daily for 7 days  Get chest XRay - we will call tomorrow with results   Community-Acquired Pneumonia, Adult Pneumonia is an infection of the lungs. It causes swelling in the airways of the lungs. Mucus and fluid may also build up inside the airways. One type of pneumonia can happen while a person is in a hospital. A different type can happen when a person is not in a hospital (community-acquired pneumonia).  What are the causes?  This condition is caused by germs (viruses, bacteria, or fungi). Some types of germs can be passed from one person to another. This can happen when you breathe in droplets from the cough or sneeze of an infected person. What increases the risk? You are more likely to develop this condition if you:  Have a long-term (chronic) disease, such as: ? Chronic obstructive pulmonary disease (COPD). ? Asthma. ? Cystic fibrosis. ? Congestive heart failure. ? Diabetes. ? Kidney disease.  Have HIV.  Have sickle cell disease.  Have had your spleen removed.  Do not take good care of your teeth and mouth (poor dental hygiene).  Have a medical condition that increases the risk of breathing in droplets from your own mouth and nose.  Have a weakened body defense system (immune system).  Are a smoker.  Travel to areas where the germs that cause this illness are common.  Are around certain animals or the places they live. What are the signs or symptoms?  A dry cough.  A wet (productive) cough.  Fever.  Sweating.  Chest pain. This often happens when breathing deeply or coughing.  Fast breathing or trouble breathing.  Shortness of breath.  Shaking chills.  Feeling tired (fatigue).  Muscle aches. How is this treated? Treatment for this  condition depends on many things. Most adults can be treated at home. In some cases, treatment must happen in a hospital. Treatment may include:  Medicines given by mouth or through an IV tube.  Being given extra oxygen.  Respiratory therapy. In rare cases, treatment for very bad pneumonia may include:  Using a machine to help you breathe.  Having a procedure to remove fluid from around your lungs. Follow these instructions at home: Medicines  Take over-the-counter and prescription medicines only as told by your doctor. ? Only take cough medicine if you are losing sleep.  If you were prescribed an antibiotic medicine, take it as told by your doctor. Do not stop taking the antibiotic even if you start to feel better. General instructions   Sleep with your head and neck raised (elevated). You can do this by sleeping in a recliner or by putting a few pillows under your head.  Rest as needed. Get at least 8 hours of sleep each night.  Drink enough water to keep your pee (urine) pale yellow.  Eat a healthy diet that includes plenty of vegetables, fruits, whole grains, low-fat dairy products, and lean protein.  Do not use any products that contain nicotine or tobacco. These include cigarettes, e-cigarettes, and chewing tobacco. If you need help quitting, ask your doctor.  Keep all follow-up visits as told by your doctor. This is important. How is this prevented? A shot (vaccine) can help prevent pneumonia. Shots are often suggested for:  People older than 74  years of age.  People older than 74 years of age who: ? Are having cancer treatment. ? Have long-term (chronic) lung disease. ? Have problems with their body's defense system. You may also prevent pneumonia if you take these actions:  Get the flu (influenza) shot every year.  Go to the dentist as often as told.  Wash your hands often. If you cannot use soap and water, use hand sanitizer. Contact a doctor if:  You have  a fever.  You lose sleep because your cough medicine does not help. Get help right away if:  You are short of breath and it gets worse.  You have more chest pain.  Your sickness gets worse. This is very serious if: ? You are an older adult. ? Your body's defense system is weak.  You cough up blood. Summary  Pneumonia is an infection of the lungs.  Most adults can be treated at home. Some will need treatment in a hospital.  Drink enough water to keep your pee pale yellow.  Get at least 8 hours of sleep each night. This information is not intended to replace advice given to you by your health care provider. Make sure you discuss any questions you have with your health care provider. Document Released: 11/01/2007 Document Revised: 01/10/2018 Document Reviewed: 01/10/2018 Elsevier Interactive Patient Education  2019 [employer].

## 2018-06-24 NOTE — Telephone Encounter (Signed)
-----   Message from Jerrol Banana., MD sent at 06/20/2018  1:40 PM EST ----- Labs stable.  Prediabetic.  Work on dietary and exercise habits

## 2018-06-24 NOTE — Telephone Encounter (Signed)
Patient advised as below. Patient verbalizes understanding and is in agreement with treatment plan.  

## 2018-06-24 NOTE — Telephone Encounter (Signed)
Wanting to discuss what symptoms she's having -fever and coughing.  She has had the flu shot.  Also needing to discuss ALPRAZolam (XANAX) 0.5 MG tablet.  Please adivse.  Thanks, [employer]

## 2018-06-25 ENCOUNTER — Telehealth: Payer: Self-pay | Admitting: Family Medicine

## 2018-06-25 NOTE — Telephone Encounter (Signed)
Would not recommend steroids unless she is having wheezing or other issues.  Would follow through with fluids rest and the antibiotics that are prescribed.

## 2018-06-25 NOTE — Telephone Encounter (Signed)
Pt stated she was advised this morning that she has pneumonia and she is requesting an Rx for steroids be sent to [facility]. Pt was advised that Dr. Jacinto Reap is out of the office this afternoon and requested if another provider could send in Rx. Please advise. Thanks TNP

## 2018-06-26 ENCOUNTER — Telehealth: Payer: Self-pay | Admitting: Family Medicine

## 2018-06-26 NOTE — Telephone Encounter (Signed)
Continue antibiotics.  Take probiotic and eat yogurt to help with diarrhea.  OK to take immodium as well.  We can Rx Zofran if needed for nausea

## 2018-06-26 NOTE — Telephone Encounter (Signed)
Patient advised. She declined Zofran RX at this time.

## 2018-06-26 NOTE — Telephone Encounter (Signed)
Agree with Dr Rosanna Randy.  She does not have wheeze.  Antibiotics will treat her pneumonia.  Steroids are not indicated.  Do not need to call her again if she has already been called

## 2018-06-26 NOTE — Telephone Encounter (Signed)
Patient advised.

## 2018-06-26 NOTE — Telephone Encounter (Signed)
Pt calling to let Dr B know she is have side effects from the following:  amoxicillin-clavulanate (AUGMENTIN) 875-125 MG tablet doxycycline (VIBRA-TABS) 100 MG tablet  diarrhea - nausea - weak - cannot eat.    Pt asking what she needs to do. Please call pt back asap.  Thanks, [employer]

## 2018-06-27 ENCOUNTER — Other Ambulatory Visit: Payer: Self-pay

## 2018-06-27 ENCOUNTER — Emergency Department
Admission: EM | Admit: 2018-06-27 | Discharge: 2018-06-27 | Disposition: A | Discharge status: Home / Self Care | Payer: Medicare Other | Attending: Emergency Medicine | Admitting: Emergency Medicine

## 2018-06-27 ENCOUNTER — Encounter: Payer: Self-pay | Admitting: Emergency Medicine

## 2018-06-27 DIAGNOSIS — R531 Weakness: Secondary | ICD-10-CM | POA: Insufficient documentation

## 2018-06-27 DIAGNOSIS — R109 Unspecified abdominal pain: Secondary | ICD-10-CM | POA: Diagnosis not present

## 2018-06-27 DIAGNOSIS — I110 Hypertensive heart disease with heart failure: Secondary | ICD-10-CM | POA: Insufficient documentation

## 2018-06-27 DIAGNOSIS — Z853 Personal history of malignant neoplasm of breast: Secondary | ICD-10-CM | POA: Insufficient documentation

## 2018-06-27 DIAGNOSIS — Z79899 Other long term (current) drug therapy: Secondary | ICD-10-CM | POA: Insufficient documentation

## 2018-06-27 DIAGNOSIS — E039 Hypothyroidism, unspecified: Secondary | ICD-10-CM | POA: Insufficient documentation

## 2018-06-27 DIAGNOSIS — I5032 Chronic diastolic (congestive) heart failure: Secondary | ICD-10-CM | POA: Insufficient documentation

## 2018-06-27 DIAGNOSIS — R197 Diarrhea, unspecified: Secondary | ICD-10-CM | POA: Diagnosis not present

## 2018-06-27 DIAGNOSIS — R110 Nausea: Secondary | ICD-10-CM | POA: Insufficient documentation

## 2018-06-27 DIAGNOSIS — R7303 Prediabetes: Secondary | ICD-10-CM | POA: Insufficient documentation

## 2018-06-27 LAB — C DIFFICILE QUICK SCREEN W PCR REFLEX
C Diff antigen: NEGATIVE
C Diff interpretation: NOT DETECTED
C Diff toxin: NEGATIVE

## 2018-06-27 LAB — COMPREHENSIVE METABOLIC PANEL
ALT: 29 U/L (ref 0–44)
AST: 30 U/L (ref 15–41)
Albumin: 4.5 g/dL (ref 3.5–5.0)
Alkaline Phosphatase: 64 U/L (ref 38–126)
Anion gap: 8 (ref 5–15)
BUN: 9 mg/dL (ref 8–23)
CO2: 26 mmol/L (ref 22–32)
Calcium: 8.9 mg/dL (ref 8.9–10.3)
Chloride: 105 mmol/L (ref 98–111)
Creatinine, Ser: 0.55 mg/dL (ref 0.44–1.00)
GFR calc Af Amer: >60 mL/min (ref >60)
GFR calc non Af Amer: >60 mL/min (ref >60)
Glucose, Bld: 105 mg/dL — ABNORMAL HIGH (ref 70–99)
Potassium: 3.3 mmol/L — ABNORMAL LOW (ref 3.5–5.1)
Sodium: 139 mmol/L (ref 135–145)
Total Bilirubin: 0.6 mg/dL (ref 0.3–1.2)
Total Protein: 7.2 g/dL (ref 6.5–8.1)

## 2018-06-27 LAB — CBC WITH DIFFERENTIAL/PLATELET
Abs Immature Granulocytes: 0.01 10*3/uL (ref 0.00–0.07)
Basophils Absolute: 0 10*3/uL (ref 0.0–0.1)
Basophils Relative: 0 %
Eosinophils Absolute: 0.1 10*3/uL (ref 0.0–0.5)
Eosinophils Relative: 1 %
HCT: 36.2 % (ref 36.0–46.0)
Hemoglobin: 11.7 g/dL — ABNORMAL LOW (ref 12.0–15.0)
Immature Granulocytes: 0 %
Lymphocytes Relative: 42 %
Lymphs Abs: 2 10*3/uL (ref 0.7–4.0)
MCH: 28.7 pg (ref 26.0–34.0)
MCHC: 32.3 g/dL (ref 30.0–36.0)
MCV: 88.7 fL (ref 80.0–100.0)
Monocytes Absolute: 0.4 10*3/uL (ref 0.1–1.0)
Monocytes Relative: 8 %
Neutro Abs: 2.4 10*3/uL (ref 1.7–7.7)
Neutrophils Relative %: 49 %
Platelets: 237 10*3/uL (ref 150–400)
RBC: 4.08 MIL/uL (ref 3.87–5.11)
RDW: 12.6 % (ref 11.5–15.5)
WBC: 4.8 10*3/uL (ref 4.0–10.5)
nRBC: 0 % (ref 0.0–0.2)

## 2018-06-27 LAB — GASTROINTESTINAL PANEL BY PCR, STOOL (REPLACES STOOL CULTURE)

## 2018-06-27 LAB — URINALYSIS, COMPLETE (UACMP) WITH MICROSCOPIC
Bacteria, UA: NONE SEEN
Bilirubin Urine: NEGATIVE
Glucose, UA: NEGATIVE mg/dL
Hgb urine dipstick: NEGATIVE
Ketones, ur: NEGATIVE mg/dL
Leukocytes, UA: NEGATIVE
Nitrite: NEGATIVE
Protein, ur: NEGATIVE mg/dL
Specific Gravity, Urine: 1.008 (ref 1.005–1.030)
Squamous Epithelial / LPF: NONE SEEN (ref 0–5)
pH: 7 (ref 5.0–8.0)

## 2018-06-27 LAB — LIPASE, BLOOD: Lipase: 45 U/L (ref 11–51)

## 2018-06-27 MED ORDER — ONDANSETRON 4 MG PO TBDP: 4.0000 mg | ORAL_TABLET | Freq: Three times a day (TID) | ORAL | 0 refills | Status: DC | PRN

## 2018-06-27 MED ORDER — POTASSIUM CHLORIDE CRYS ER 20 MEQ PO TBCR
40.0000 meq | EXTENDED_RELEASE_TABLET | Freq: Once | ORAL | Status: AC
  Administered 2018-06-27: 40 meq via ORAL
  Filled 2018-06-27: qty 2

## 2018-06-27 MED ORDER — SODIUM CHLORIDE 0.9 % IV BOLUS
1000.0000 mL | Freq: Once | INTRAVENOUS | Status: AC
  Administered 2018-06-27: 1000 mL via INTRAVENOUS

## 2018-06-27 NOTE — ED Triage Notes (Signed)
Pt to triage via w/c with no distress noted, mask in place; pt reports that she was dx with pneumonia, augmentin and doxycycline, rx ; c/o diarrhea and nausea with lower abd pain; pt also reports that she ran out of her xanax which she takes for restless legs

## 2018-06-27 NOTE — ED Provider Notes (Signed)
[facility] Emergency Department Provider Note ____   First MD Initiated Contact with Patient 06/27/18 0225     (approximate)  I have reviewed the triage vital signs and the nursing notes.   HISTORY  Chief Complaint Abdominal Pain   HPI Sandra [name] is a 73 y.o. female with below list of chronic medical conditions including recently diagnosed pneumonia for which the patient was prescribed Augmentin and doxycycline presents to the emergency department with 1 day history of diarrhea and nausea with abdominal cramping.  Patient states that she has had multiple episodes of diarrhea and has generalized weakness at present.   Past Medical History:  Diagnosis Date  . Abnormality of left ventricle of heart    MILD-MODERATE THICKENING PER ECHO ON 01-18-17  . Anemia   . Anxiety   . Arthritis   . BRCA gene mutation negative 10/2016   [facility]  . Breast cancer ([place]) 10/16/2016   T1c,N0;, GRADE I/III, 1.6cm. ER/PR pos  HER2 not over expressed, Right Upper Outer  . Celiac disease   . Depression   . Diastolic dysfunction   . Dyspnea    WITH EXERTION  . GERD (gastroesophageal reflux disease)    OCC  . Glaucoma    right eye  . Headache    H/O MIGRAINES AS TEENAGER  . Heart murmur    PT WAS TOLD ONCE SHE HAD MVP AND THEN ANOTHER MD SAID SHE DID NOT HAVE MVP  . Hypothyroidism   . Pneumonia    YEARS AGO  . Raynaud's disease   . RLS (restless legs syndrome)     Patient Active Problem List   Diagnosis Date Noted  . Chronic diastolic heart failure ([place]) 05/17/2017  . Essential hypertension 05/17/2017  . Glaucoma (increased eye pressure) 02/06/2017  . Invasive ductal carcinoma of breast, female, right ([place]) 10/20/2016  . Prolapse of vaginal vault after hysterectomy 08/24/2016  . Vaginal atrophy 08/24/2016  . Dyspareunia in female 08/24/2016  . Colon polyps 01/06/2015  . BCC (basal cell carcinoma of skin) 01/06/2015  . Hypothyroidism 01/06/2015  .  Vitamin D deficiency 01/06/2015  . Hyperlipidemia 01/06/2015  . Depression, major 01/06/2015  . GAD (generalized anxiety disorder) 01/06/2015  . RLS (restless legs syndrome) 01/06/2015  . Cataract 01/06/2015  . Raynaud phenomenon 01/06/2015  . GERD (gastroesophageal reflux disease) 01/06/2015  . Celiac disease 01/06/2015  . OA (osteoarthritis) 01/06/2015  . Osteoporosis 01/06/2015  . Pre-diabetes 01/06/2015  . Avitaminosis D 01/06/2015  . Raynaud's syndrome without gangrene 01/06/2015  . Age-related osteoporosis without current pathological fracture 01/06/2015  . Anxiety disorder 01/05/2015    Past Surgical History:  Procedure Laterality Date  . ABDOMINAL HYSTERECTOMY    . APPENDECTOMY    . AXILLARY LYMPH NODE BIOPSY Right 03/05/2017   Procedure: AXILLARY LYMPH NODE BIOPSY;  Surgeon: Robert Bellow, MD;  Location: [facility] ORS;  Service: General;  Laterality: Right;  . CATARACT EXTRACTION Bilateral   . CESAREAN SECTION    . COLONOSCOPY WITH PROPOFOL N/A 05/19/2015   Procedure: COLONOSCOPY WITH PROPOFOL;  Surgeon: Manya Silvas, MD;  Location: [facility] ENDOSCOPY;  Service: Endoscopy;  Laterality: N/A;  . EYE SURGERY    . HAND SURGERY Left   . MASTECTOMY Right 11/07/2016   RESIDUAL INVASIVE MAMMARY CARCINOMA, SUBAREOLAR ANTERIOR TO PREVIOUS   . SENTINEL NODE BIOPSY Right 11/07/2016   Procedure: SENTINEL NODE BIOPSY;  Surgeon: Robert Bellow, MD;  Location: [facility] ORS;  Service: General;  Laterality: Right;  . SIMPLE  MASTECTOMY WITH AXILLARY SENTINEL NODE BIOPSY Right 11/07/2016   Procedure: SIMPLE MASTECTOMY;  Surgeon: Robert Bellow, MD;  Location: [facility] ORS;  Service: General;  Laterality: Right;  . TONSILLECTOMY  AGE 51  . UPPER GI ENDOSCOPY  01/12/06   hiatus hernia    Prior to Admission medications   Medication Sig Start Date End Date Taking? Authorizing Provider  acetaminophen (TYLENOL) 500 MG tablet Take 500 mg by mouth every 6 (six) hours as needed for mild pain,  moderate pain or headache.    [provider]  ALPRAZolam Duanne Moron) 0.5 MG tablet Take 1 tablet (0.5 mg total) by mouth at bedtime. 06/24/18   Jerrol Banana., MD  amLODipine (NORVASC) 5 MG tablet Take 1 tablet (5 mg total) by mouth daily. 06/12/18   Jerrol Banana., MD  amoxicillin-clavulanate (AUGMENTIN) 875-125 MG tablet Take 1 tablet by mouth 2 (two) times daily for 7 days. 06/24/18 07/01/18  Virginia Crews, MD  Calcium Carbonate (CALCIUM 600 PO) Take 600 mg by mouth daily at 3 pm.     [provider]  carvedilol (COREG) 3.125 MG tablet Take 1 tablet (3.125 mg total) by mouth 2 (two) times daily. 06/12/18 09/10/18  Jerrol Banana., MD  Cholecalciferol (VITAMIN D3) 1000 units CAPS Take 1,000 Units by mouth.    [provider]  dicyclomine (BENTYL) 10 MG capsule TAKE 1 CAPSULE (10 MG TOTAL) BY MOUTH 3 (THREE) TIMES DAILY BEFORE MEALS. 06/12/18   Jerrol Banana., MD  doxycycline (VIBRA-TABS) 100 MG tablet Take 1 tablet (100 mg total) by mouth 2 (two) times daily for 7 days. 06/24/18 07/01/18  Virginia Crews, MD  hydroxypropyl methylcellulose / hypromellose (ISOPTO TEARS / GONIOVISC) 2.5 % ophthalmic solution Place 1 drop into both eyes 4 (four) times daily as needed for dry eyes (scheduled each morning & then as needed).    [provider]  latanoprost (XALATAN) 0.005 % ophthalmic solution INT 1 GTT IN OD 1 TIME IN THE EVE 02/06/17   [provider]  letrozole (FEMARA) 2.5 MG tablet Take 1 tablet (2.5 mg total) by mouth daily. 06/12/18   Jerrol Banana., MD  levothyroxine (SYNTHROID, LEVOTHROID) 100 MCG tablet TAKE 1 TABLET (100 MCG) BY MOUTH  BEFORE BREAKFAST *30 MINS PRIOR TO BREAKFAST* 06/12/18   Jerrol Banana., MD  Multiple Vitamin (MULTIVITAMIN WITH MINERALS) TABS tablet Take 1 tablet by mouth daily at 3 pm.    [provider]  Omega-3 Fatty Acids (FISH OIL PO) Take 1 capsule by mouth daily.     [provider]  omeprazole (PRILOSEC) 40 MG capsule Take 1 capsule (40 mg total) by mouth daily. 02/18/18   Jerrol Banana., MD  ondansetron (ZOFRAN ODT) 4 MG disintegrating tablet Take 1 tablet (4 mg total) by mouth every 8 (eight) hours as needed. 06/27/18   Gregor Hams, MD  polyethylene glycol [facility] / Floria Raveling) packet Take 17 g by mouth daily.    [provider]  sertraline (ZOLOFT) 100 MG tablet Take 1 tablet (100 mg total) by mouth daily. 06/12/18   Jerrol Banana., MD  Simethicone (GAS-X PO) Take 1 tablet by mouth as needed.    [provider]    Allergies Benadryl [diphenhydramine hcl (sleep)]; Advil [ibuprofen]; and Claritin [loratadine]  Family History  Problem Relation Age of Onset  . Cancer Mother   . Anemia Mother   . Other Mother  lymphosarcoma  . Alcohol abuse Father   . Prostate cancer Father   . Breast cancer Sister        26; 1/2 sister, shared mother.  . Cancer Sister        bile duct  . Stroke Maternal Grandmother   . Breast cancer Maternal Grandmother 68  . Heart attack Maternal Grandfather   . CAD Paternal Grandfather     Social History Social History   Tobacco Use  . Smoking status: Never Smoker  . Smokeless tobacco: Never Used  Substance Use Topics  . Alcohol use: Yes    Comment: 0-2 glasses of wine a month  . Drug use: No    Review of Systems Constitutional: No fever/chills Eyes: No visual changes. ENT: No sore throat. Cardiovascular: Denies chest pain. Respiratory: Denies shortness of breath. Gastrointestinal: No abdominal pain.  Positive for nausea and diarrhea.   Genitourinary: Negative for dysuria. Musculoskeletal: Negative for neck pain.  Negative for back pain. Integumentary: Negative for rash. Neurological: Negative for headaches, focal weakness or numbness.   ____________________________________________   PHYSICAL EXAM:  VITAL SIGNS: ED Triage Vitals  Enc Vitals Group     BP  06/27/18 0007 (!) 143/75     Pulse Rate 06/27/18 0007 71     Resp 06/27/18 0007 18     Temp 06/27/18 0007 98.4 F (36.9 C)     Temp Source 06/27/18 0007 Oral     SpO2 06/27/18 0007 95 %     Weight 06/27/18 0008 59.9 kg (132 lb)     Height 06/27/18 0008 1.6 m ('5\' 3"' )     Head Circumference --      Peak Flow --      Pain Score 06/27/18 0007 6     Pain Loc --      Pain Edu? --      Excl. in [place]? --     Constitutional: Alert and oriented. Well appearing and in no acute distress. Eyes: Conjunctivae are normal.  Mouth/Throat: Mucous membranes are moist.  Oropharynx non-erythematous. Neck: No stridor.   Cardiovascular: Normal rate, regular rhythm. Good peripheral circulation. Grossly normal heart sounds. Respiratory: Normal respiratory effort.  No retractions. Lungs CTAB. Gastrointestinal: Soft and nontender. No distention.   Musculoskeletal: No lower extremity tenderness nor edema. No gross deformities of extremities. Neurologic:  Normal speech and language. No gross focal neurologic deficits are appreciated.  Skin:  Skin is warm, dry and intact. No rash noted. Psychiatric: Mood and affect are normal. Speech and behavior are normal.  ____________________________________________   LABS (all labs ordered are listed, but only abnormal results are displayed)  Labs Reviewed  CBC WITH DIFFERENTIAL/PLATELET - Abnormal; Notable for the following components:      Result Value   Hemoglobin 11.7 (*)    All other components within normal limits  COMPREHENSIVE METABOLIC PANEL - Abnormal; Notable for the following components:   Potassium 3.3 (*)    Glucose, Bld 105 (*)    All other components within normal limits  URINALYSIS, COMPLETE (UACMP) WITH MICROSCOPIC - Abnormal; Notable for the following components:   Color, Urine STRAW (*)    APPearance CLEAR (*)    All other components within normal limits  C DIFFICILE QUICK SCREEN W PCR REFLEX  GASTROINTESTINAL PANEL BY PCR, STOOL (REPLACES  STOOL CULTURE)  LIPASE, BLOOD    Procedures   ____________________________________________   INITIAL IMPRESSION / ASSESSMENT AND PLAN / ED COURSE  As part of my medical decision making, I reviewed  the following data within the [place] NUMBER 73 year old female presented with above-stated history and physical exam secondary to diarrhea.  Differential diagnosis include adverse reaction to Augmentin and doxycycline versus C. difficile or other potential infectious diarrhea.  Patient has no abdominal pain with palpation and as such imaging not performed C. difficile and GI stool panel negative.  Suspect patient's to be secondary to antibiotics associated diarrhea.  Patient given 1 L IV normal saline and Zofran in the emergency department with improvement of symptoms. ____________________________________________  FINAL CLINICAL IMPRESSION(S) / ED DIAGNOSES  Final diagnoses:  Diarrhea, unspecified type     MEDICATIONS GIVEN DURING THIS VISIT:  Medications  sodium chloride 0.9 % bolus 1,000 mL (0 mLs Intravenous Stopped 06/27/18 0434)  potassium chloride SA (K-DUR,KLOR-CON) CR tablet 40 mEq (40 mEq Oral Given 06/27/18 0354)     ED Discharge Orders         Ordered    ondansetron (ZOFRAN ODT) 4 MG disintegrating tablet  Every 8 hours PRN     06/27/18 0608           Note:  This document was prepared using Dragon voice recognition software and may include unintentional dictation errors.    Gregor Hams, MD 06/27/18 [phone]

## 2018-07-05 ENCOUNTER — Other Ambulatory Visit: Payer: Self-pay

## 2018-07-05 DIAGNOSIS — Z1231 Encounter for screening mammogram for malignant neoplasm of breast: Secondary | ICD-10-CM

## 2018-07-08 ENCOUNTER — Encounter: Payer: Self-pay | Admitting: General Surgery

## 2018-08-20 ENCOUNTER — Ambulatory Visit: Payer: Medicare Other | Admitting: General Surgery

## 2018-09-23 ENCOUNTER — Telehealth: Payer: Self-pay | Admitting: General Surgery

## 2018-09-23 ENCOUNTER — Other Ambulatory Visit: Payer: Self-pay

## 2018-09-23 MED ORDER — LETROZOLE 2.5 MG PO TABS: 2.5000 mg | ORAL_TABLET | Freq: Every day | ORAL | 3 refills | Status: DC

## 2018-09-23 NOTE — Telephone Encounter (Signed)
Call to patient to see what medication she needs refilled. No answer.

## 2018-09-23 NOTE — Telephone Encounter (Signed)
Patient needed Letrozole refilled. She is coming in to see Dr Bary Castilla in June due to the Covid restrictions. Refill sent into [facility].

## 2018-09-23 NOTE — Telephone Encounter (Signed)
Patient is calling and needs a refill on one of her medication sent to [facility]. Please call patient and advise.

## 2018-10-03 ENCOUNTER — Ambulatory Visit: Payer: Medicare Other | Admitting: General Surgery

## 2018-10-07 ENCOUNTER — Encounter: Payer: Self-pay | Admitting: Family Medicine

## 2018-10-17 ENCOUNTER — Other Ambulatory Visit: Payer: Self-pay | Admitting: Family Medicine

## 2018-10-17 DIAGNOSIS — R6889 Other general symptoms and signs: Secondary | ICD-10-CM | POA: Diagnosis not present

## 2018-10-18 ENCOUNTER — Other Ambulatory Visit: Payer: Self-pay | Admitting: Family Medicine

## 2018-10-18 DIAGNOSIS — F419 Anxiety disorder, unspecified: Secondary | ICD-10-CM

## 2018-10-22 ENCOUNTER — Telehealth: Payer: Self-pay | Admitting: Family Medicine

## 2018-10-22 DIAGNOSIS — R1084 Generalized abdominal pain: Secondary | ICD-10-CM

## 2018-10-22 MED ORDER — OMEPRAZOLE 40 MG PO CPDR: 40.0000 mg | DELAYED_RELEASE_CAPSULE | Freq: Every day | ORAL | 0 refills | Status: DC

## 2018-10-22 NOTE — Telephone Encounter (Signed)
Please review

## 2018-10-22 NOTE — Telephone Encounter (Signed)
[facility] faxed refill request for the following medications:  omeprazole (PRILOSEC) 40 MG capsule   Please advise. Thanks TNP

## 2018-10-28 ENCOUNTER — Ambulatory Visit
Admission: RE | Admit: 2018-10-28 | Discharge: 2018-10-28 | Disposition: A | Discharge status: Home / Self Care | Payer: Medicare Other | Source: Home / Self Care | Attending: Family Medicine | Admitting: Family Medicine

## 2018-10-28 ENCOUNTER — Other Ambulatory Visit: Payer: Self-pay

## 2018-10-28 ENCOUNTER — Ambulatory Visit (INDEPENDENT_AMBULATORY_CARE_PROVIDER_SITE_OTHER): Payer: Medicare Other | Admitting: Family Medicine

## 2018-10-28 ENCOUNTER — Encounter: Payer: Self-pay | Admitting: Family Medicine

## 2018-10-28 ENCOUNTER — Ambulatory Visit
Admission: RE | Admit: 2018-10-28 | Discharge: 2018-10-28 | Disposition: A | Discharge status: Home / Self Care | Payer: Medicare Other | Source: Ambulatory Visit | Attending: General Surgery | Admitting: General Surgery

## 2018-10-28 ENCOUNTER — Ambulatory Visit
Admission: RE | Admit: 2018-10-28 | Discharge: 2018-10-28 | Disposition: A | Discharge status: Home / Self Care | Payer: Medicare Other | Source: Ambulatory Visit | Attending: Family Medicine | Admitting: Family Medicine

## 2018-10-28 VITALS — BP 120/71 | HR 61 | Temp 98.2°F | Resp 16 | Wt 132.0 lb

## 2018-10-28 DIAGNOSIS — Z1231 Encounter for screening mammogram for malignant neoplasm of breast: Secondary | ICD-10-CM | POA: Insufficient documentation

## 2018-10-28 DIAGNOSIS — M1711 Unilateral primary osteoarthritis, right knee: Secondary | ICD-10-CM

## 2018-10-28 DIAGNOSIS — M25561 Pain in right knee: Secondary | ICD-10-CM

## 2018-10-28 NOTE — Patient Instructions (Signed)
Take Tylenol 3x a day PRN for pain and use ace wrap as needed.

## 2018-10-28 NOTE — Progress Notes (Signed)
Patient: Sandra [name] Female    DOB: Sep 04, 1945   73 y.o.   MRN: [id number] Visit Date: 10/28/2018  Today's Provider: [name], MD   Chief Complaint  Patient presents with  . Joint Swelling   Subjective:     Knee Pain   The incident occurred more than 1 week ago. There was no injury mechanism (patient states that she may have overused her joints going up and down stairs). The pain is present in the right hip, right ankle and right knee. The quality of the pain is described as aching. The pain is moderate. The pain has been worsening since onset. Associated symptoms include an inability to bear weight and muscle weakness. Pertinent negatives include no loss of sensation or numbness. The symptoms are aggravated by movement and weight bearing (patient states that pain only affects her in the PM). She has tried non-weight bearing for the symptoms.  No known trauma.  The pain does appear to be worse with weightbearing.  It is mainly in the knee but goes down the leg a little bit and extends up to the hip.  Allergies  Allergen Reactions  . Benadryl [Diphenhydramine Hcl (Sleep)] Other (See Comments)    Hyperactivity/causes opposite effect  . Advil [Ibuprofen] Rash    CAN TAKE NAPROSYN  . Claritin [Loratadine] Rash     Current Outpatient Medications:  .  ALPRAZolam (XANAX) 0.5 MG tablet, TAKE 1 TABLET BY MOUTH AT  BEDTIME, Disp: 90 tablet, Rfl: 0 .  amLODipine (NORVASC) 5 MG tablet, Take 1 tablet (5 mg total) by mouth daily., Disp: 90 tablet, Rfl: 2 .  Calcium Carbonate (CALCIUM 600 PO), Take 600 mg by mouth daily at 3 pm. , Disp: , Rfl:  .  carvedilol (COREG) 3.125 MG tablet, TAKE 1 TABLET BY MOUTH TWO  TIMES DAILY, Disp: 180 tablet, Rfl: 1 .  Cholecalciferol (VITAMIN D3) 1000 units CAPS, Take 1,000 Units by mouth., Disp: , Rfl:  .  dicyclomine (BENTYL) 10 MG capsule, TAKE 1 CAPSULE (10 MG TOTAL) BY MOUTH 3 (THREE) TIMES DAILY BEFORE MEALS., Disp: 270 capsule, Rfl: 3 .   hydroxypropyl methylcellulose / hypromellose (ISOPTO TEARS / GONIOVISC) 2.5 % ophthalmic solution, Place 1 drop into both eyes 4 (four) times daily as needed for dry eyes (scheduled each morning & then as needed)., Disp: , Rfl:  .  latanoprost (XALATAN) 0.005 % ophthalmic solution, INT 1 GTT IN OD 1 TIME IN THE EVE, Disp: , Rfl: 5 .  letrozole (FEMARA) 2.5 MG tablet, Take 1 tablet (2.5 mg total) by mouth daily., Disp: 90 tablet, Rfl: 3 .  levothyroxine (SYNTHROID, LEVOTHROID) 100 MCG tablet, TAKE 1 TABLET (100 MCG) BY MOUTH  BEFORE BREAKFAST *30 MINS PRIOR TO BREAKFAST*, Disp: 90 tablet, Rfl: 3 .  Multiple Vitamin (MULTIVITAMIN WITH MINERALS) TABS tablet, Take 1 tablet by mouth daily at 3 pm., Disp: , Rfl:  .  Omega-3 Fatty Acids (FISH OIL PO), Take 1 capsule by mouth daily. , Disp: , Rfl:  .  omeprazole (PRILOSEC) 40 MG capsule, Take 1 capsule (40 mg total) by mouth daily., Disp: 90 capsule, Rfl: 0 .  polyethylene glycol (MIRALAX / GLYCOLAX) packet, Take 17 g by mouth daily., Disp: , Rfl:  .  sertraline (ZOLOFT) 100 MG tablet, Take 1 tablet (100 mg total) by mouth daily., Disp: 90 tablet, Rfl: 3 .  Simethicone (GAS-X PO), Take 1 tablet by mouth as needed., Disp: , Rfl:   Review of  Systems  Constitutional: Negative.   Eyes: Negative.   Respiratory: Negative.   Cardiovascular: Negative.   Gastrointestinal: Negative.   Endocrine: Negative.   Musculoskeletal: Positive for arthralgias and joint swelling.  Allergic/Immunologic: Negative.   Neurological: Negative for numbness.  Psychiatric/Behavioral: Negative.     Social History   Tobacco Use  . Smoking status: Never Smoker  . Smokeless tobacco: Never Used  Substance Use Topics  . Alcohol use: Yes    Comment: 0-2 glasses of wine a month      Objective:   BP 120/71   Pulse 61   Temp 98.2 F (36.8 C) (Oral)   Resp 16   Wt 132 lb (59.9 kg)   BMI 23.38 kg/m  Vitals:   10/28/18 0904  BP: 120/71  Pulse: 61  Resp: 16  Temp: 98.2  F (36.8 C)  TempSrc: Oral  Weight: 132 lb (59.9 kg)     Physical Exam Vitals signs reviewed.  Constitutional:      Appearance: Normal appearance.  HENT:     Head: Normocephalic and atraumatic.     Right Ear: External ear normal.     Left Ear: External ear normal.     Nose: Nose normal.  Eyes:     General: No scleral icterus. Cardiovascular:     Rate and Rhythm: Normal rate and regular rhythm.  Pulmonary:     Effort: Pulmonary effort is normal.     Breath sounds: Normal breath sounds.  Abdominal:     Palpations: Abdomen is soft.  Musculoskeletal:     Right lower leg: No edema.     Left lower leg: No edema.     Comments: Mild right knee effusion is obvious.  She is tender along the joint line bilaterally.  No laxity of the knee noted.  It is not warm to the touch.  Skin:    General: Skin is warm and dry.  Neurological:     General: No focal deficit present.     Mental Status: She is alert and oriented to person, place, and time.  Psychiatric:        Mood and Affect: Mood normal.        Thought Content: Thought content normal.        Judgment: Judgment normal.         Assessment & Plan    1. Acute pain of right knee Use Tylenol and topical treatments.  She is intolerant of ibuprofen.  Make an appointment for her to see Dr. Brita Romp in a few weeks for possible injection.  She will call to cancel this appointment if she is better. - DG Knee Complete 4 Views Right  2. Primary osteoarthritis of right knee Most likely diagnosis.     Shannah Conteh Cranford Mon, MD  [facility] Fritzi Mandes [place],acting as a scribe for [name], MD.,have documented all relevant documentation on the behalf of [name], MD,as directed by  [name], MD while in the presence of [name], MD.

## 2018-10-29 ENCOUNTER — Telehealth: Payer: Self-pay

## 2018-10-29 NOTE — Telephone Encounter (Signed)
Patient notified of results.

## 2018-10-29 NOTE — Telephone Encounter (Signed)
Left message for patient to call back regarding xray results. 

## 2018-10-29 NOTE — Telephone Encounter (Signed)
-----   Message from Jerrol Banana., MD sent at 10/28/2018  2:24 PM EDT ----- Mild arthritic changes

## 2018-11-05 ENCOUNTER — Encounter: Payer: Self-pay | Admitting: General Surgery

## 2018-11-05 ENCOUNTER — Ambulatory Visit: Payer: Medicare Other | Admitting: General Surgery

## 2018-11-05 ENCOUNTER — Other Ambulatory Visit: Payer: Self-pay

## 2018-11-05 VITALS — BP 120/70 | HR 68 | Temp 97.7°F | Ht 63.0 in | Wt 130.0 lb

## 2018-11-05 DIAGNOSIS — Z170 Estrogen receptor positive status [ER+]: Secondary | ICD-10-CM

## 2018-11-05 DIAGNOSIS — C50411 Malignant neoplasm of upper-outer quadrant of right female breast: Secondary | ICD-10-CM

## 2018-11-05 NOTE — Progress Notes (Signed)
Patient ID: Sandra [name], female   DOB: 1946/02/24, 73 y.o.   MRN: [id number]  Chief Complaint  Patient presents with  . Follow-up    mammogram     HPI Sandra [name] is a 73 y.o. female who presents for a breast evaluation. The most recent left breast mammogram was done on 10/28/2018.  [name]  Patient does perform regular self breast checks and gets regular mammograms done. Patient states she has a fall about three weeks ago.    HPI  Past Medical History:  Diagnosis Date  . Abnormality of left ventricle of heart    MILD-MODERATE THICKENING PER ECHO ON 01-18-17  . Anemia   . Anxiety   . Arthritis   . BRCA gene mutation negative 10/2016   [facility]  . Breast cancer ([place]) 10/16/2016   T1c,N0;, GRADE I/III, 1.6cm. ER/PR pos  HER2 not over expressed, Right Upper Outer  . Celiac disease   . Depression   . Diastolic dysfunction   . Dyspnea    WITH EXERTION  . GERD (gastroesophageal reflux disease)    OCC  . Glaucoma    right eye  . Headache    H/O MIGRAINES AS TEENAGER  . Heart murmur    PT WAS TOLD ONCE SHE HAD MVP AND THEN ANOTHER MD SAID SHE DID NOT HAVE MVP  . Hypothyroidism   . Pneumonia    YEARS AGO  . Raynaud's disease   . RLS (restless legs syndrome)     Past Surgical History:  Procedure Laterality Date  . ABDOMINAL HYSTERECTOMY    . APPENDECTOMY    . AXILLARY LYMPH NODE BIOPSY Right 03/05/2017   Procedure: AXILLARY LYMPH NODE BIOPSY;  Surgeon: Robert Bellow, MD;  Location: [facility] ORS;  Service: General;  Laterality: Right;  . CATARACT EXTRACTION Bilateral   . CESAREAN SECTION    . COLONOSCOPY WITH PROPOFOL N/A 05/19/2015   Procedure: COLONOSCOPY WITH PROPOFOL;  Surgeon: Manya Silvas, MD;  Location[facility] ENDOSCOPY;  Service: Endoscopy;  Laterality: N/A;  . EYE SURGERY    . HAND SURGERY Left   . MASTECTOMY Right 11/07/2016   RESIDUAL INVASIVE MAMMARY CARCINOMA, SUBAREOLAR ANTERIOR TO PREVIOUS   . SENTINEL NODE BIOPSY Right 11/07/2016   Procedure: SENTINEL NODE  BIOPSY;  Surgeon: Robert Bellow, MD;  Location: [facility] ORS;  Service: General;  Laterality: Right;  . SIMPLE MASTECTOMY WITH AXILLARY SENTINEL NODE BIOPSY Right 11/07/2016   Procedure: SIMPLE MASTECTOMY;  Surgeon: Robert Bellow, MD;  Location: [facility] ORS;  Service: General;  Laterality: Right;  . TONSILLECTOMY  AGE 52  . UPPER GI ENDOSCOPY  01/12/06   hiatus hernia    Family History  Problem Relation Age of Onset  . Cancer Mother   . Anemia Mother   . Other Mother        lymphosarcoma  . Alcohol abuse Father   . Prostate cancer Father   . Breast cancer Sister        63; 1/2 sister, shared mother.  . Cancer Sister        bile duct  . Stroke Maternal Grandmother   . Breast cancer Maternal Grandmother 68  . Heart attack Maternal Grandfather   . CAD Paternal Grandfather     Social History Social History   Tobacco Use  . Smoking status: Never Smoker  . Smokeless tobacco: Never Used  Substance Use Topics  . Alcohol use: Yes    Comment: 0-2 glasses of wine a month  . Drug  use: No    Allergies  Allergen Reactions  . Benadryl [Diphenhydramine Hcl (Sleep)] Other (See Comments)    Hyperactivity/causes opposite effect  . Advil [Ibuprofen] Rash    CAN TAKE NAPROSYN  . Claritin [Loratadine] Rash    Current Outpatient Medications  Medication Sig Dispense Refill  . ALPRAZolam (XANAX) 0.5 MG tablet TAKE 1 TABLET BY MOUTH AT  BEDTIME 90 tablet 0  . amLODipine (NORVASC) 5 MG tablet Take 1 tablet (5 mg total) by mouth daily. 90 tablet 2  . Calcium Carbonate (CALCIUM 600 PO) Take 600 mg by mouth daily at 3 pm.     . carvedilol (COREG) 3.125 MG tablet TAKE 1 TABLET BY MOUTH TWO  TIMES DAILY 180 tablet 1  . Cholecalciferol (VITAMIN D3) 1000 units CAPS Take 1,000 Units by mouth.    . dicyclomine (BENTYL) 10 MG capsule TAKE 1 CAPSULE (10 MG TOTAL) BY MOUTH 3 (THREE) TIMES DAILY BEFORE MEALS. 270 capsule 3  . hydroxypropyl methylcellulose / hypromellose (ISOPTO TEARS / GONIOVISC) 2.5  % ophthalmic solution Place 1 drop into both eyes 4 (four) times daily as needed for dry eyes (scheduled each morning & then as needed).    . latanoprost (XALATAN) 0.005 % ophthalmic solution INT 1 GTT IN OD 1 TIME IN THE EVE  5  . letrozole (FEMARA) 2.5 MG tablet Take 1 tablet (2.5 mg total) by mouth daily. 90 tablet 3  . levothyroxine (SYNTHROID, LEVOTHROID) 100 MCG tablet TAKE 1 TABLET (100 MCG) BY MOUTH  BEFORE BREAKFAST *30 MINS PRIOR TO BREAKFAST* 90 tablet 3  . Multiple Vitamin (MULTIVITAMIN WITH MINERALS) TABS tablet Take 1 tablet by mouth daily at 3 pm.    . Omega-3 Fatty Acids (FISH OIL PO) Take 1 capsule by mouth daily.     [name] omeprazole (PRILOSEC) 40 MG capsule Take 1 capsule (40 mg total) by mouth daily. 90 capsule 0  . polyethylene glycol (MIRALAX / GLYCOLAX) packet Take 17 g by mouth daily.    . sertraline (ZOLOFT) 100 MG tablet Take 1 tablet (100 mg total) by mouth daily. 90 tablet 3  . Simethicone (GAS-X PO) Take 1 tablet by mouth as needed.     No current facility-administered medications for this visit.     Review of Systems Review of Systems  Constitutional: Negative.   Respiratory: Negative.   Cardiovascular: Negative.     Blood pressure 120/70, pulse 68, temperature 97.7 F (36.5 C), height 5' 3"  (1.6 m), weight 130 lb (59 kg), SpO2 97 %.  Physical Exam Physical Exam Constitutional:      Appearance: She is well-developed.  Eyes:     General: No scleral icterus.    Conjunctiva/sclera: Conjunctivae normal.  Neck:     Musculoskeletal: Neck supple.  Cardiovascular:     Rate and Rhythm: Normal rate and regular rhythm.     Heart sounds: Normal heart sounds.  Pulmonary:     Effort: Pulmonary effort is normal.     Breath sounds: Normal breath sounds.  Chest:     Breasts:        Right: Normal.        Left: Normal.  Lymphadenopathy:     Cervical: No cervical adenopathy.  Skin:    General: Skin is warm and dry.  Neurological:     Mental Status: She is alert  and oriented to person, place, and time.     Data Reviewed October 28, 2018 left breast screening mammogram was independently reviewed.  BI-RADS-1.  Bone density  testing dated July 16, 2017 showed normal AP spine, osteopenia of the femoral neck.  No discernible change over the last 8 years.   Assessment No evidence of recurrent breast cancer.  Plan The patient was asked to increase her calcium intake to 2-600 mg tablets with vitamin D daily. Patient will be asked to return to the office in one year with a left screening mammogram.  HPI, Physical Exam, Assessment and Plan have been scribed under the direction and in the presence of Robert Bellow, MD.  I have completed the exam and reviewed the above documentation for accuracy and completeness.  I agree with the above.  Haematologist has been used and any errors in dictation or transcription are unintentional.  Hervey Ard, M.D., F.A.C.S.   Forest Gleason Byrnett 11/06/2018, 8:46 AM

## 2018-11-06 ENCOUNTER — Encounter: Payer: Self-pay | Admitting: General Surgery

## 2018-11-20 ENCOUNTER — Other Ambulatory Visit: Payer: Self-pay | Admitting: Family Medicine

## 2018-11-27 ENCOUNTER — Ambulatory Visit: Payer: Self-pay | Admitting: Family Medicine

## 2018-12-05 ENCOUNTER — Other Ambulatory Visit: Payer: Self-pay | Admitting: Family Medicine

## 2018-12-05 ENCOUNTER — Other Ambulatory Visit: Payer: Self-pay | Admitting: Physician Assistant

## 2018-12-05 DIAGNOSIS — F419 Anxiety disorder, unspecified: Secondary | ICD-10-CM

## 2018-12-05 DIAGNOSIS — R1084 Generalized abdominal pain: Secondary | ICD-10-CM

## 2018-12-05 NOTE — Telephone Encounter (Signed)
Medication sent in. 

## 2018-12-24 DIAGNOSIS — H401111 Primary open-angle glaucoma, right eye, mild stage: Secondary | ICD-10-CM | POA: Diagnosis not present

## 2018-12-27 ENCOUNTER — Encounter: Payer: Self-pay | Admitting: General Surgery

## 2019-02-17 ENCOUNTER — Other Ambulatory Visit: Payer: Self-pay | Admitting: Family Medicine

## 2019-02-17 DIAGNOSIS — F419 Anxiety disorder, unspecified: Secondary | ICD-10-CM

## 2019-02-17 NOTE — Telephone Encounter (Signed)
Pt needs a refill   Alprazolam .05 x 90 days  [employer]

## 2019-02-18 MED ORDER — ALPRAZOLAM 0.5 MG PO TABS: 0.5000 mg | ORAL_TABLET | Freq: Every day | ORAL | 1 refills | Status: DC

## 2019-02-20 ENCOUNTER — Other Ambulatory Visit: Payer: Self-pay | Admitting: Family Medicine

## 2019-03-14 ENCOUNTER — Other Ambulatory Visit: Payer: Self-pay | Admitting: Family Medicine

## 2019-03-31 DIAGNOSIS — H401111 Primary open-angle glaucoma, right eye, mild stage: Secondary | ICD-10-CM | POA: Diagnosis not present

## 2019-04-10 DIAGNOSIS — H401131 Primary open-angle glaucoma, bilateral, mild stage: Secondary | ICD-10-CM | POA: Diagnosis not present

## 2019-04-17 DIAGNOSIS — H169 Unspecified keratitis: Secondary | ICD-10-CM | POA: Diagnosis not present

## 2019-04-28 ENCOUNTER — Telehealth: Payer: Self-pay

## 2019-04-28 NOTE — Telephone Encounter (Signed)
Copied from [place] [id number]. Topic: Appointment Scheduling - Scheduling Inquiry for Clinic >> Apr 28, 2019  2:42 PM [place], [place] wrote: Reason for CRM: Pt stated she has not heard back from anyone and she would like to verify if her appt for tomorrow will be a telephone call or if she is required to come in to the office. Pt requests call back.

## 2019-04-28 NOTE — Progress Notes (Signed)
Subjective:   Sandra [name] is a 73 y.o. female who presents for Medicare Annual (Subsequent) preventive examination.    This visit is being conducted through telemedicine due to the COVID-19 pandemic. This patient has given me verbal consent via doximity to conduct this visit, patient states they are participating from their home address. Some vital signs may be absent or patient reported.    Patient identification: identified by name, DOB, and current address  Review of Systems:  N/A  Cardiac Risk Factors include: advanced age (>43mn, >>56women);hypertension;dyslipidemia     Objective:     Vitals: There were no vitals taken for this visit.  There is no height or weight on file to calculate BMI. Unable to obtain vitals due to visit being conducted via telephonically.   Advanced Directives 04/29/2019 06/27/2018 04/22/2018 04/18/2017 03/05/2017 03/05/2017 11/07/2016  Does Patient Have a Medical Advance Directive? Yes No Yes Yes Yes No Yes  Type of AParamedicof APoydr[place]Living will - [place]it[place]Living will [place]Living will [place]burgLiving will - [place] HillsLiving will  Copy of [place] Chart? Yes - validated most recent copy scanned in chart (See row information) - Yes - validated most recent copy scanned in chart (See row information) No - copy requested No - copy requested - Yes  Would patient like information on creating a medical advance directive? - No - Patient declined - - - No - Patient declined -    Tobacco Social History   Tobacco Use  Smoking Status Never Smoker  Smokeless Tobacco Never Used     Counseling given: Not Answered   Clinical Intake:  Pre-visit preparation completed: Yes  Pain : No/denies pain Pain Score: 0-No pain     Nutritional Risks: None Diabetes: No  How often do you need to have someone help you when you read  instructions, pamphlets, or other written materials from your doctor or pharmacy?: 3 - Sometimes(Due to glaucoma in both eyes.)  Interpreter Needed?: No  Information [facility] :: [facility]  Past Medical History:  Diagnosis Date  . Abnormality of left ventricle of heart    MILD-MODERATE THICKENING PER ECHO ON 01-18-17  . Anemia   . Anxiety   . Arthritis   . BRCA gene mutation negative 10/2016   NEGATIVE: [employer]  . Breast cancer ([place] 10/16/[place]   T1c,N0;, GRADE I/III, 1.6cm. ER/PR pos  HER2 not over expressed, Right Upper Outer  . Celiac disease   . Depression   . Diastolic dysfunction   . Dyspnea    WITH EXERTION  . GERD (gastroesophageal reflux disease)    OCC  . Glaucoma    right eye  . Headache    H/O MIGRAINES AS TEENAGER  . Heart murmur    PT WAS TOLD ONCE SHE HAD MVP AND THEN ANOTHER MD SAID SHE DID NOT HAVE MVP  . Hypothyroidism   . Pneumonia    YEARS AGO  . Raynaud's disease   . RLS (restless legs syndrome)    Past Surgical History:  Procedure Laterality Date  . ABDOMINAL HYSTERECTOMY    . APPENDECTOMY    . AXILLARY LYMPH NODE BIOPSY Right 03/05/2017   Procedure: AXILLARY LYMPH NODE BIOPSY;  Surgeon: BRobert Bellow MD;  Location: [facility] ORS;  Service: General;  Laterality: Right;  . CATARACT EXTRACTION Bilateral   . CESAREAN SECTION    . COLONOSCOPY WITH PROPOFOL N/A 05/19/2015  Procedure: COLONOSCOPY WITH PROPOFOL;  Surgeon: Manya Silvas, MD;  Location[facility];  Service: Endoscopy;  Laterality: N/A;  . EYE SURGERY    . HAND SURGERY Left   . MASTECTOMY Right 11/07/2016   RESIDUAL INVASIVE MAMMARY CARCINOMA, SUBAREOLAR ANTERIOR TO PREVIOUS   . SENTINEL NODE BIOPSY Right 11/07/2016   Procedure: SENTINEL NODE BIOPSY;  Surgeon: Robert Bellow, MD;  Location: [facility] ORS;  Service: General;  Laterality: Right;  . SIMPLE MASTECTOMY WITH AXILLARY SENTINEL NODE BIOPSY Right 11/07/2016   6 mm ER/PR 100%; Her 2 neu not overexpressed, T1b, N0.   Surgeon: Robert Bellow, MD;  Location: [facility] ORS;  Service: General;  Laterality: Right;  . TONSILLECTOMY  AGE 60  . UPPER GI ENDOSCOPY  01/12/06   hiatus hernia   Family History  Problem Relation Age of Onset  . Cancer Mother   . Anemia Mother   . Other Mother        lymphosarcoma  . Alcohol abuse Father   . Prostate cancer Father   . Breast cancer Sister        3; 1/2 sister, shared mother.  . Cancer Sister        bile duct  . Stroke Maternal Grandmother   . Breast cancer Maternal Grandmother 68  . Heart attack Maternal Grandfather   . CAD Paternal Grandfather    Social History   Socioeconomic History  . Marital status: Married    Spouse name: Not on file  . Number of children: 2  . Years of education: Not on file  . Highest education level: Some college, no degree  Occupational History  . Occupation: retired  Scientific laboratory technician  . Financial resource strain: Not hard at all  . Food insecurity    Worry: Never true    Inability: Never true  . Transportation needs    Medical: No    Non-medical: No  Tobacco Use  . Smoking status: Never Smoker  . Smokeless tobacco: Never Used  Substance and Sexual Activity  . Alcohol use: Yes    Comment: 0-2 glasses of wine a month  . Drug use: No  . Sexual activity: Not Currently    Birth control/protection: None  Lifestyle  . Physical activity    Days per week: 0 days    Minutes per session: 0 min  . Stress: Not at all  Relationships  . Social Herbalist on phone: Patient refused    Gets together: Patient refused    Attends religious service: Patient refused    Active member of club or organization: Patient refused    Attends meetings of clubs or organizations: Patient refused    Relationship status: Patient refused  Other Topics Concern  . Not on file  Social History Narrative  . Not on file    Outpatient Encounter Medications as of 04/29/2019  Medication Sig  . ALPRAZolam (XANAX) 0.5 MG tablet Take 1  tablet (0.5 mg total) by mouth at bedtime.  [name] amLODipine (NORVASC) 5 MG tablet TAKE 1 TABLET BY MOUTH  DAILY  . Calcium Carbonate (CALCIUM 600 PO) Take 600 mg by mouth daily at 3 pm.   . carvedilol (COREG) 3.125 MG tablet TAKE 1 TABLET BY MOUTH  TWICE DAILY  . Cholecalciferol (VITAMIN D3) 1000 units CAPS Take 1,000 Units by mouth.  . dicyclomine (BENTYL) 10 MG capsule TAKE 1 CAPSULE (10 MG TOTAL) BY MOUTH 3 (THREE) TIMES DAILY BEFORE MEALS.  . hydroxypropyl methylcellulose / hypromellose (  ISOPTO TEARS / GONIOVISC) 2.5 % ophthalmic solution Place 1 drop into both eyes 4 (four) times daily as needed for dry eyes (scheduled each morning & then as needed).  . latanoprost (XALATAN) 0.005 % ophthalmic solution INT 1 GTT IN OD 1 TIME IN THE EVE  . letrozole (FEMARA) 2.5 MG tablet Take 1 tablet (2.5 mg total) by mouth daily.  [name] levothyroxine (SYNTHROID) 100 MCG tablet TAKE 1 TABLET BY MOUTH 30  MINUTES BEFORE BREAKFAST  . Multiple Vitamin (MULTIVITAMIN WITH MINERALS) TABS tablet Take 1 tablet by mouth daily at 3 pm.  . omeprazole (PRILOSEC) 40 MG capsule TAKE 1 CAPSULE BY MOUTH  DAILY  . sennosides-docusate sodium (SENOKOT-S) 8.6-50 MG tablet Take 1 tablet by mouth as needed for constipation.  . sertraline (ZOLOFT) 100 MG tablet Take 1 tablet (100 mg total) by mouth daily.  . Simethicone (GAS-X PO) Take 1 tablet by mouth as needed.  . Omega-3 Fatty Acids (FISH OIL PO) Take 1 capsule by mouth daily.   . polyethylene glycol (MIRALAX / GLYCOLAX) packet Take 17 g by mouth daily.   No facility-administered encounter medications on file as of 04/29/2019.     Activities of Daily Living In your present state of health, do you have any difficulty performing the following activities: 04/29/2019  Hearing? N  Vision? Y  Comment Due to glaucoma in both eyes.  Difficulty concentrating or making decisions? Y  Walking or climbing stairs? Y  Comment Due to knee pains occasionally or SOB.  Dressing or bathing? N   Doing errands, shopping? N  Preparing Food and eating ? N  Using the Toilet? N  In the past six months, have you accidently leaked urine? N  Do you have problems with loss of bowel control? N  Managing your Medications? N  Managing your Finances? N  Housekeeping or managing your Housekeeping? N  Some recent data might be hidden    Patient Care Team: Jerrol Banana., MD as PCP - General (Family Medicine) Leandrew Koyanagi, MD as Referring Physician (Ophthalmology) Bary Castilla, Forest Gleason, MD (General Surgery) End, Harrell Gave, MD as Consulting Physician (Cardiology) [facility] [place] as Audiologist Teressa Lower, Louanna Raw., MD (Dentistry)    Assessment:   This is a routine wellness examination for Fritzi.  Exercise Activities and Dietary recommendations Current Exercise Habits: The patient does not participate in regular exercise at present, Exercise limited by: orthopedic condition(s)  Goals    . DIET - INCREASE WATER INTAKE     Recommend increasing water intake to 4-6 glasses a day.   04/22/18- Recommend increasing water intake to 6-8 glasses a day.        Fall Risk: Fall Risk  04/29/2019 04/22/2018 06/06/2017 04/18/2017 04/07/2016  Falls in the past year? 0 0 No No Yes  Number falls in past yr: 0 - - - 1  Injury with Fall? 0 - - - Yes  Comment - - - - left foot fracture  Follow up - - - - -    [place]:  Any stairs in or around the home? Yes  If so, are there any without handrails? No   Home free of loose throw rugs in walkways, pet beds, electrical cords, etc? Yes  Adequate lighting in your home to reduce risk of falls? Yes   ASSISTIVE DEVICES UTILIZED TO PREVENT FALLS:  Life alert? No  Use of a cane, walker or w/c? No  Grab bars in the bathroom? Yes  Shower chair or bench in shower? No  Elevated toilet seat or a handicapped toilet? Yes    TIMED UP AND GO:  Was the test performed? No .    Depression Screen  PHQ 2/9 Scores 04/29/2019 04/22/2018 06/06/2017 04/18/2017  PHQ - 2 Score 0 0 0 1  PHQ- 9 Score - - 1 2     Cognitive Function: Declined today.     6CIT Screen 04/07/2016  What Year? 0 points  What month? 0 points  What time? 0 points  Count back from 20 0 points  Months in reverse 0 points  Repeat phrase 0 points  Total Score 0    Immunization History  Administered Date(s) Administered  . Influenza, High Dose Seasonal PF 03/04/2015, 04/07/2016, 03/16/2017, 02/09/2018, 02/19/2019  . Pneumococcal Conjugate-13 02/05/2014  . Pneumococcal Polysaccharide-23 04/07/2016  . Tdap 03/18/2008    Qualifies for Shingles Vaccine? Yes . Due for Shingrix. Pt has been advised to call insurance company to determine out of pocket expense. Advised may also receive vaccine at local pharmacy or Health Dept. Verbalized acceptance and understanding.  Tdap: Although this vaccine is not a covered service during a Wellness Exam, does the patient still wish to receive this vaccine today?  No .   Flu Vaccine: Up to date  Pneumococcal Vaccine: Completed series  Screening Tests Health Maintenance  Topic Date Due  . TETANUS/TDAP  04/28/2020 (Originally 03/18/2018)  . COLONOSCOPY  05/18/2020  . MAMMOGRAM  10/27/2020  . DEXA SCAN  07/16/2022  . INFLUENZA VACCINE  Completed  . Hepatitis C Screening  Completed  . PNA vac Low Risk Adult  Completed    Cancer Screenings:  Colorectal Screening: Completed 05/19/15. Repeat every 5 years.  Mammogram: Completed 10/28/18.   Bone Density: Completed 07/16/17. Results reflect OSTEOPENIA. Repeat every 5 years.   Lung Cancer Screening: (Low Dose CT Chest recommended if Age 40-80 years, 30 pack-year currently smoking OR have quit w/in 15years.) does not qualify.   Additional Screening:  Hepatitis C Screening: Up to date  Vision Screening: Recommended annual ophthalmology exams for early detection of glaucoma and other disorders of the eye.  Dental Screening:  Recommended annual dental exams for proper oral hygiene  Community Resource Referral:  CRR required this visit?  No       Plan:  I have personally reviewed and addressed the Medicare Annual Wellness questionnaire and have noted the following in the patient's chart:  A. Medical and social history B. Use of alcohol, tobacco or illicit drugs  C. Current medications and supplements D. Functional ability and status E.  Nutritional status F.  Physical activity G. Advance directives H. List of other physicians I.  Hospitalizations, surgeries, and ER visits in previous 12 months J.  [place] such as hearing and vision if needed, cognitive and depression L. Referrals and appointments   In addition, I have reviewed and discussed with patient certain preventive protocols, quality metrics, and best practice recommendations. A written personalized care plan for preventive services as well as general preventive health recommendations were provided to patient. Nurse Health Advisor  Signed,    Demitri Kucinski [place], [place]  [id number] Nurse Health Advisor   Nurse Notes: None.

## 2019-04-28 NOTE — Telephone Encounter (Signed)
Patient advised her appt tomorrow with [facility] will be telephone visit.

## 2019-04-29 ENCOUNTER — Ambulatory Visit (INDEPENDENT_AMBULATORY_CARE_PROVIDER_SITE_OTHER): Payer: Medicare Other

## 2019-04-29 ENCOUNTER — Other Ambulatory Visit: Payer: Self-pay

## 2019-04-29 DIAGNOSIS — Z0000 Encounter for general adult medical examination without abnormal findings: Secondary | ICD-10-CM | POA: Diagnosis not present

## 2019-04-29 NOTE — Patient Instructions (Signed)
Sandra [name] , Thank you for taking time to come for your Medicare Wellness Visit. I appreciate your ongoing commitment to your health goals. Please review the following plan we discussed and let me know if I can assist you in the future.   Screening recommendations/referrals: Colonoscopy: Up to date, due 04/2020 Mammogram: Up to date, due 10/2020 Bone Density: Up to date, due 06/2022 Recommended yearly ophthalmology/optometry visit for glaucoma screening and checkup Recommended yearly dental visit for hygiene and checkup  Vaccinations: Influenza vaccine: Up to date Pneumococcal vaccine: Completed series Tdap vaccine: Pt declines today.  Shingles vaccine: the patient    Advanced directives: Currently on file.   Conditions/risks identified: Continue to increase water intake to 6-8 8 oz glasses a day.   Next appointment: 06/30/19 @ 10:00 AM with Dr Rosanna Randy.    Preventive Care 7 Years and Older, Female Preventive care refers to lifestyle choices and visits with your health care provider that can promote health and wellness. What does preventive care include?  A yearly physical exam. This is also called an annual well check.  Dental exams once or twice a year.  Routine eye exams. Ask your health care provider how often you should have your eyes checked.  Personal lifestyle choices, including:  Daily care of your teeth and gums.  Regular physical activity.  Eating a healthy diet.  Avoiding tobacco and drug use.  Limiting alcohol use.  Practicing safe sex.  Taking low-dose aspirin every day.  Taking vitamin and mineral supplements as recommended by your health care provider. What happens during an annual well check? The services and screenings done by your health care provider during your annual well check will depend on your age, overall health, lifestyle risk factors, and family history of disease. Counseling  Your health care provider may ask you questions about your:   Alcohol use.  Tobacco use.  Drug use.  Emotional well-being.  Home and relationship well-being.  Sexual activity.  Eating habits.  History of falls.  Memory and ability to understand (cognition).  Work and work Statistician.  Reproductive health. Screening  You may have the following tests or measurements:  Height, weight, and BMI.  Blood pressure.  Lipid and cholesterol levels. These may be checked every 5 years, or more frequently if you are over 85 years old.  Skin check.  Lung cancer screening. You may have this screening every year starting at age 44 if you have a 30-pack-year history of smoking and currently smoke or have quit within the past 15 years.  Fecal occult blood test (FOBT) of the stool. You may have this test every year starting at age 71.  Flexible sigmoidoscopy or colonoscopy. You may have a sigmoidoscopy every 5 years or a colonoscopy every 10 years starting at age 19.  Hepatitis C blood test.  Hepatitis B blood test.  Sexually transmitted disease (STD) testing.  Diabetes screening. This is done by checking your blood sugar (glucose) after you have not eaten for a while (fasting). You may have this done every 1-3 years.  Bone density scan. This is done to screen for osteoporosis. You may have this done starting at age 80.  Mammogram. This may be done every 1-2 years. Talk to your health care provider about how often you should have regular mammograms. Talk with your health care provider about your test results, treatment options, and if necessary, the need for more tests. Vaccines  Your health care provider may recommend certain vaccines, such as:  Influenza  vaccine. This is recommended every year.  Tetanus, diphtheria, and acellular pertussis (Tdap, Td) vaccine. You may need a Td booster every 10 years.  Zoster vaccine. You may need this after age 73.  Pneumococcal 13-valent conjugate (PCV13) vaccine. One dose is recommended after age 23.   Pneumococcal polysaccharide (PPSV23) vaccine. One dose is recommended after age 62. Talk to your health care provider about which screenings and vaccines you need and how often you need them. This information is not intended to replace advice given to you by your health care provider. Make sure you discuss any questions you have with your health care provider. Document Released: 06/11/2015 Document Revised: 02/02/2016 Document Reviewed: 03/16/2015 Elsevier Interactive Patient Education  2017 [place] Prevention in the Home Falls can cause injuries. They can happen to people of all ages. There are many things you can do to make your home safe and to help prevent falls. What can I do on the outside of my home?  Regularly fix the edges of walkways and driveways and fix any cracks.  Remove anything that might make you trip as you walk through a door, such as a raised step or threshold.  Trim any bushes or trees on the path to your home.  Use bright outdoor lighting.  Clear any walking paths of anything that might make someone trip, such as rocks or tools.  Regularly check to see if handrails are loose or broken. Make sure that both sides of any steps have handrails.  Any raised decks and porches should have guardrails on the edges.  Have any leaves, snow, or ice cleared regularly.  Use sand or salt on walking paths during winter.  Clean up any spills in your garage right away. This includes oil or grease spills. What can I do in the bathroom?  Use night lights.  Install grab bars by the toilet and in the tub and shower. Do not use towel bars as grab bars.  Use non-skid mats or decals in the tub or shower.  If you need to sit down in the shower, use a plastic, non-slip stool.  Keep the floor dry. Clean up any water that spills on the floor as soon as it happens.  Remove soap buildup in the tub or shower regularly.  Attach bath mats securely with double-sided  non-slip rug tape.  Do not have throw rugs and other things on the floor that can make you trip. What can I do in the bedroom?  Use night lights.  Make sure that you have a light by your bed that is easy to reach.  Do not use any sheets or blankets that are too big for your bed. They should not hang down onto the floor.  Have a firm chair that has side arms. You can use this for support while you get dressed.  Do not have throw rugs and other things on the floor that can make you trip. What can I do in the kitchen?  Clean up any spills right away.  Avoid walking on wet floors.  Keep items that you use a lot in easy-to-reach places.  If you need to reach something above you, use a strong step stool that has a grab bar.  Keep electrical cords out of the way.  Do not use floor polish or wax that makes floors slippery. If you must use wax, use non-skid floor wax.  Do not have throw rugs and other things on the floor that can  make you trip. What can I do with my stairs?  Do not leave any items on the stairs.  Make sure that there are handrails on both sides of the stairs and use them. Fix handrails that are broken or loose. Make sure that handrails are as long as the stairways.  Check any carpeting to make sure that it is firmly attached to the stairs. Fix any carpet that is loose or worn.  Avoid having throw rugs at the top or bottom of the stairs. If you do have throw rugs, attach them to the floor with carpet tape.  Make sure that you have a light switch at the top of the stairs and the bottom of the stairs. If you do not have them, ask someone to add them for you. What else can I do to help prevent falls?  Wear shoes that:  Do not have high heels.  Have rubber bottoms.  Are comfortable and fit you well.  Are closed at the toe. Do not wear sandals.  If you use a stepladder:  Make sure that it is fully opened. Do not climb a closed stepladder.  Make sure that both  sides of the stepladder are locked into place.  Ask someone to hold it for you, if possible.  Clearly mark and make sure that you can see:  Any grab bars or handrails.  First and last steps.  Where the edge of each step is.  Use tools that help you move around (mobility aids) if they are needed. These include:  Canes.  Walkers.  Scooters.  Crutches.  Turn on the lights when you go into a dark area. Replace any light bulbs as soon as they burn out.  Set up your furniture so you have a clear path. Avoid moving your furniture around.  If any of your floors are uneven, fix them.  If there are any pets around you, be aware of where they are.  Review your medicines with your doctor. Some medicines can make you feel dizzy. This can increase your chance of falling. Ask your doctor what other things that you can do to help prevent falls. This information is not intended to replace advice given to you by your health care provider. Make sure you discuss any questions you have with your health care provider. Document Released: 03/11/2009 Document Revised: 10/21/2015 Document Reviewed: 06/19/2014 Elsevier Interactive Patient Education  2017 [employer].

## 2019-05-26 ENCOUNTER — Other Ambulatory Visit: Payer: Self-pay | Admitting: Family Medicine

## 2019-06-27 NOTE — Progress Notes (Signed)
Patient: Sandra Sandra [name], Sandra [name]    DOB: 12/17/1945, 74 y.o.   MRN: [id number] Visit Date: 06/30/2019  Today's Provider: [name], MD   Chief Complaint  Patient presents with  . Annual Exam   Subjective:     Patient has AWV with [facility] 04/29/2019.   Complete Physical Sandra Sandra [name] is a 74 y.o. Sandra [name]. She feels well. She reports exercising none. She reports she is sleeping fairly well. She has had her first Covid vaccine. ----------------------------------------------------------  Colonoscopy: 05/19/2015 Mammogram: 10/28/2018  Review of Systems  Constitutional: Negative.   HENT: Positive for hearing loss, postnasal drip, sneezing and trouble swallowing.   Eyes: Positive for photophobia.  Respiratory: Positive for cough.   Gastrointestinal: Positive for abdominal distention and constipation.  Endocrine: Positive for cold intolerance.  Musculoskeletal: Positive for back pain and neck pain.  Allergic/Immunologic: Positive for environmental allergies.    Social History   Socioeconomic History  . Marital status: Married    Spouse name: Not on file  . Number of children: 2  . Years of education: Not on file  . Highest education level: Some college, no degree  Occupational History  . Occupation: retired  Tobacco Use  . Smoking status: Never Smoker  . Smokeless tobacco: Never Used  Substance and Sexual Activity  . Alcohol use: Yes    Comment: 0-2 glasses of wine a month  . Drug use: No  . Sexual activity: Not Currently    Birth control/protection: None  Other Topics Concern  . Not on file  Social History Narrative  . Not on file   Social Determinants of Health   Financial Resource Strain:   . Difficulty of Paying Living Expenses: Not on file  Food Insecurity:   . Worried About Charity fundraiser in the Last Year: Not on file  . Ran Out of Food in the Last Year: Not on file  Transportation Needs:   . Lack of Transportation (Medical): Not on  file  . Lack of Transportation (Non-Medical): Not on file  Physical Activity:   . Days of Exercise per Week: Not on file  . Minutes of Exercise per Session: Not on file  Stress: No Stress Concern Present  . Feeling of Stress : Not at all  Social Connections:   . Frequency of Communication with Friends and Family: Not on file  . Frequency of Social Gatherings with Friends and Family: Not on file  . Attends Religious Services: Not on file  . Active Member of Clubs or Organizations: Not on file  . Attends Archivist Meetings: Not on file  . Marital Status: Not on file  Intimate Partner Violence:   . Fear of Current or Ex-Partner: Not on file  . Emotionally Abused: Not on file  . Physically Abused: Not on file  . Sexually Abused: Not on file    Past Medical History:  Diagnosis Date  . Abnormality of left ventricle of heart    MILD-MODERATE THICKENING PER ECHO ON 01-18-17  . Anemia   . Anxiety   . Arthritis   . BRCA gene mutation negative 10/2016   NEGATIVE: [employer]  . Breast cancer ([place]) 10/16/2016   T1c,N0;, GRADE I/III, 1.6cm. ER/PR pos  HER2 not over expressed, Right Upper Outer  . Celiac disease   . Depression   . Diastolic dysfunction   . Dyspnea    WITH EXERTION  . GERD (gastroesophageal reflux disease)    OCC  .  Glaucoma    right eye  . Headache    H/O MIGRAINES AS TEENAGER  . Heart murmur    PT WAS TOLD ONCE SHE HAD MVP AND THEN ANOTHER MD SAID SHE DID NOT HAVE MVP  . Hypothyroidism   . Pneumonia    YEARS AGO  . Raynaud's disease   . RLS (restless legs syndrome)      Patient Active Problem List   Diagnosis Date Noted  . Chronic diastolic heart failure ([place]) 05/17/2017  . Essential hypertension 05/17/2017  . Glaucoma (increased eye pressure) 02/06/2017  . Invasive ductal carcinoma of breast, Sandra [name], right ([place]) 10/20/2016  . Prolapse of vaginal vault after hysterectomy 08/24/2016  . Vaginal atrophy 08/24/2016  . Dyspareunia in Sandra [name] 08/24/2016    . Colon polyps 01/06/2015  . BCC (basal cell carcinoma of skin) 01/06/2015  . Hypothyroidism 01/06/2015  . Vitamin D deficiency 01/06/2015  . Hyperlipidemia 01/06/2015  . Depression, major 01/06/2015  . GAD (generalized anxiety disorder) 01/06/2015  . RLS (restless legs syndrome) 01/06/2015  . Cataract 01/06/2015  . Raynaud phenomenon 01/06/2015  . GERD (gastroesophageal reflux disease) 01/06/2015  . Celiac disease 01/06/2015  . OA (osteoarthritis) 01/06/2015  . Osteoporosis 01/06/2015  . Pre-diabetes 01/06/2015  . Avitaminosis D 01/06/2015  . Raynaud's syndrome without gangrene 01/06/2015  . Age-related osteoporosis without current pathological fracture 01/06/2015  . Anxiety disorder 01/05/2015    Past Surgical History:  Procedure Laterality Date  . ABDOMINAL HYSTERECTOMY    . APPENDECTOMY    . AXILLARY LYMPH NODE BIOPSY Right 03/05/2017   Procedure: AXILLARY LYMPH NODE BIOPSY;  Surgeon: Robert Bellow, MD;  Location: [facility] ORS;  Service: General;  Laterality: Right;  . CATARACT EXTRACTION Bilateral   . CESAREAN SECTION    . COLONOSCOPY WITH PROPOFOL N/A 05/19/2015   Procedure: COLONOSCOPY WITH PROPOFOL;  Surgeon: Manya Silvas, MD;  Location: [facility] ENDOSCOPY;  Service: Endoscopy;  Laterality: N/A;  . EYE SURGERY    . HAND SURGERY Left   . MASTECTOMY Right 11/07/2016   RESIDUAL INVASIVE MAMMARY CARCINOMA, SUBAREOLAR ANTERIOR TO PREVIOUS   . SENTINEL NODE BIOPSY Right 11/07/2016   Procedure: SENTINEL NODE BIOPSY;  Surgeon: Robert Bellow, MD;  Location: [facility] ORS;  Service: General;  Laterality: Right;  . SIMPLE MASTECTOMY WITH AXILLARY SENTINEL NODE BIOPSY Right 11/07/2016   6 mm ER/PR 100%; Her 2 neu not overexpressed, T1b, N0.  Surgeon: Robert Bellow, MD;  Location: [facility] ORS;  Service: General;  Laterality: Right;  . TONSILLECTOMY  AGE 52  . UPPER GI ENDOSCOPY  01/12/06   hiatus hernia    Her family history includes Alcohol abuse in her father; Anemia in her  mother; Breast cancer in her sister; Breast cancer (age of onset: 62) in her maternal grandmother; CAD in her paternal grandfather; Cancer in her mother and sister; Heart attack in her maternal grandfather; Other in her mother; Prostate cancer in her father; Stroke in her maternal grandmother.   Current Outpatient Medications:  .  ALPRAZolam (XANAX) 0.5 MG tablet, Take 1 tablet (0.5 mg total) by mouth at bedtime., Disp: 90 tablet, Rfl: 1 .  amLODipine (NORVASC) 5 MG tablet, TAKE 1 TABLET BY MOUTH  DAILY, Disp: 90 tablet, Rfl: 2 .  Biotin 1000 MCG tablet, Take 1,000 mcg by mouth daily., Disp: , Rfl:  .  Calcium Carbonate (CALCIUM 600 PO), Take 600 mg by mouth daily at 3 pm. , Disp: , Rfl:  .  carvedilol (COREG) 3.125 MG tablet, TAKE 1  TABLET BY MOUTH  TWICE DAILY, Disp: 180 tablet, Rfl: 3 .  Cholecalciferol (VITAMIN D3) 1000 units CAPS, Take 1,000 Units by mouth., Disp: , Rfl:  .  dicyclomine (BENTYL) 10 MG capsule, TAKE 1 CAPSULE BY MOUTH 3  TIMES DAILY BEFORE MEALS, Disp: 270 capsule, Rfl: 0 .  hydroxypropyl methylcellulose / hypromellose (ISOPTO TEARS / GONIOVISC) 2.5 % ophthalmic solution, Place 1 drop into both eyes 4 (four) times daily as needed for dry eyes (scheduled each morning & then as needed)., Disp: , Rfl:  .  latanoprost (XALATAN) 0.005 % ophthalmic solution, INT 1 GTT IN OD 1 TIME IN THE EVE, Disp: , Rfl: 5 .  letrozole (FEMARA) 2.5 MG tablet, Take 1 tablet (2.5 mg total) by mouth daily., Disp: 90 tablet, Rfl: 3 .  levothyroxine (SYNTHROID) 100 MCG tablet, TAKE 1 TABLET BY MOUTH 30  MINUTES BEFORE BREAKFAST, Disp: 90 tablet, Rfl: 3 .  Multiple Vitamin (MULTIVITAMIN WITH MINERALS) TABS tablet, Take 1 tablet by mouth daily at 3 pm., Disp: , Rfl:  .  Omega-3 Fatty Acids (FISH OIL PO), Take 1 capsule by mouth daily. , Disp: , Rfl:  .  omeprazole (PRILOSEC) 40 MG capsule, TAKE 1 CAPSULE BY MOUTH  DAILY, Disp: 90 capsule, Rfl: 3 .  polyethylene glycol (MIRALAX / GLYCOLAX) packet, Take 17 g  by mouth daily., Disp: , Rfl:  .  sennosides-docusate sodium (SENOKOT-S) 8.6-50 MG tablet, Take 1 tablet by mouth as needed for constipation., Disp: , Rfl:  .  sertraline (ZOLOFT) 100 MG tablet, TAKE 1 TABLET BY MOUTH  DAILY, Disp: 90 tablet, Rfl: 0 .  Simethicone (GAS-X PO), Take 1 tablet by mouth as needed., Disp: , Rfl:   Patient Care Team: Jerrol Banana., MD as PCP - General (Family Medicine) Leandrew Koyanagi, MD as Referring Physician (Ophthalmology) Bary Castilla Forest Gleason, MD (General Surgery) End, Harrell Gave, MD as Consulting Physician (Cardiology) [facility] [place] as Audiologist Teressa Lower, Louanna Raw., MD (Dentistry)     Objective:    Vitals: BP 131/74 (BP Location: Left Arm, Patient Position: Sitting, Cuff Size: Normal)   Pulse 60   Temp (!) 96.9 F (36.1 C) (Other (Comment))   Resp 18   Ht 5' 3"  (1.6 m)   Wt 134 lb (60.8 kg)   SpO2 97%   BMI 23.74 kg/m   Physical Exam Vitals reviewed.  Constitutional:      Appearance: Normal appearance. She is normal weight.  HENT:     Head: Normocephalic.     Right Ear: External ear normal.     Left Ear: External ear normal.     Nose: Nose normal.     Mouth/Throat:     Pharynx: Oropharynx is clear.  Eyes:     General: No scleral icterus. Cardiovascular:     Rate and Rhythm: Normal rate and regular rhythm.     Pulses: Normal pulses.     Heart sounds: Normal heart sounds.  Pulmonary:     Breath sounds: Normal breath sounds.  Abdominal:     Palpations: Abdomen is soft.  Skin:    General: Skin is warm and dry.  Neurological:     General: No focal deficit present.     Mental Status: She is alert and oriented to person, place, and time. Mental status is at baseline.  Psychiatric:        Mood and Affect: Mood normal.        Behavior: Behavior normal.        Thought  Content: Thought content normal.        Judgment: Judgment normal.     Activities of Daily Living In your present state of health, do you  have any difficulty performing the following activities: 04/29/2019  Hearing? N  Vision? Y  Comment Due to glaucoma in both eyes.  Difficulty concentrating or making decisions? Y  Walking or climbing stairs? Y  Comment Due to knee pains occasionally or SOB.  Dressing or bathing? N  Doing errands, shopping? N  Preparing Food and eating ? N  Using the Toilet? N  In the past six months, have you accidently leaked urine? N  Do you have problems with loss of bowel control? N  Managing your Medications? N  Managing your Finances? N  Housekeeping or managing your Housekeeping? N  Some recent data might be hidden    Fall Risk Assessment Fall Risk  04/29/2019 04/22/2018 06/06/2017 04/18/2017 04/07/2016  Falls in the past year? 0 0 No No Yes  Number falls in past yr: 0 - - - 1  Injury with Fall? 0 - - - Yes  Comment - - - - left foot fracture  Follow up - - - - -     Depression Screen PHQ 2/9 Scores 04/29/2019 04/22/2018 06/06/2017 04/18/2017  PHQ - 2 Score 0 0 0 1  PHQ- 9 Score - - 1 2    6CIT Screen 04/07/2016  What Year? 0 points  What month? 0 points  What time? 0 points  Count back from 20 0 points  Months in reverse 0 points  Repeat phrase 0 points  Total Score 0       Assessment & Plan:    Annual Physical Reviewed patient's Family Medical History Reviewed and updated list of patient's medical providers Assessment of cognitive impairment was done Assessed patient's functional ability Established a written schedule for health screening [place] Completed and Reviewed  Exercise Activities and Dietary recommendations Goals    . DIET - INCREASE WATER INTAKE     Recommend increasing water intake to 4-6 glasses a day.   04/22/18- Recommend increasing water intake to 6-8 glasses a day.        Immunization History  Administered Date(s) Administered  . Influenza, High Dose Seasonal PF 03/04/2015, 04/07/2016, 03/16/2017, 02/09/2018, 02/19/2019  .  PFIZER SARS-COV-2 Vaccination 06/18/2019  . Pneumococcal Conjugate-13 02/05/2014  . Pneumococcal Polysaccharide-23 04/07/2016  . Tdap 03/18/2008    Health Maintenance  Topic Date Due  . TETANUS/TDAP  04/28/2020 (Originally 03/18/2018)  . COLONOSCOPY  05/18/2020  . MAMMOGRAM  10/27/2020  . DEXA SCAN  07/16/2022  . INFLUENZA VACCINE  Completed  . Hepatitis C Screening  Completed  . PNA vac Low Risk Adult  Completed     Discussed health benefits of physical activity, and encouraged her to engage in regular exercise appropriate for her age and condition.    --------------------------------------------------------------------  1. Annual physical exam  - CBC w/Diff/Platelet - Comprehensive Metabolic Panel (CMET) - TSH - Lipid panel  2. Pre-diabetes  - CBC w/Diff/Platelet - Comprehensive Metabolic Panel (CMET) - TSH - Lipid panel  3. Osteoporosis, unspecified osteoporosis type, unspecified pathological fracture presence Repeat BMD. - CBC w/Diff/Platelet - Comprehensive Metabolic Panel (CMET) - TSH - Lipid panel - DG Bone Density  4. Other specified hypothyroidism  - CBC w/Diff/Platelet - Comprehensive Metabolic Panel (CMET) - TSH - Lipid panel  5. Other hyperlipidemia  - CBC w/Diff/Platelet - Comprehensive Metabolic Panel (CMET) - TSH - Lipid  panel  6. Invasive ductal carcinoma of breast, Sandra [name], right ([place]) followed by Dr. Bary Castilla. - Ambulatory referral to General Surgery  7. Polyp of colon, unspecified part of colon, unspecified type Patient had tubular adenoma and serrated adenoma on last colonoscopy 05/19/2015. - Ambulatory referral to Gastroenterology  8. Colon cancer screening  - Ambulatory referral to Gastroenterology 9.Glaucoma Per dr Murvin Natal.  Follow up in one year for CPE.   I,Sadhana Frater,acting as a scribe for [name], MD.,have documented all relevant documentation on the behalf of [name], MD,as directed by   [name], MD while in the presence of [name], MD. \   [name], MD  [facility]

## 2019-06-30 ENCOUNTER — Encounter: Payer: Self-pay | Admitting: Family Medicine

## 2019-06-30 ENCOUNTER — Ambulatory Visit (INDEPENDENT_AMBULATORY_CARE_PROVIDER_SITE_OTHER): Payer: Medicare Other | Admitting: Family Medicine

## 2019-06-30 ENCOUNTER — Other Ambulatory Visit: Payer: Self-pay

## 2019-06-30 ENCOUNTER — Telehealth: Payer: Self-pay

## 2019-06-30 VITALS — BP 131/74 | HR 60 | Temp 96.9°F | Resp 18 | Ht 63.0 in | Wt 134.0 lb

## 2019-06-30 DIAGNOSIS — Z1211 Encounter for screening for malignant neoplasm of colon: Secondary | ICD-10-CM

## 2019-06-30 DIAGNOSIS — Z0000 Encounter for general adult medical examination without abnormal findings: Secondary | ICD-10-CM | POA: Diagnosis not present

## 2019-06-30 DIAGNOSIS — E038 Other specified hypothyroidism: Secondary | ICD-10-CM

## 2019-06-30 DIAGNOSIS — M810 Age-related osteoporosis without current pathological fracture: Secondary | ICD-10-CM

## 2019-06-30 DIAGNOSIS — K635 Polyp of colon: Secondary | ICD-10-CM

## 2019-06-30 DIAGNOSIS — E7849 Other hyperlipidemia: Secondary | ICD-10-CM | POA: Diagnosis not present

## 2019-06-30 DIAGNOSIS — C50911 Malignant neoplasm of unspecified site of right female breast: Secondary | ICD-10-CM

## 2019-06-30 DIAGNOSIS — R7303 Prediabetes: Secondary | ICD-10-CM

## 2019-06-30 NOTE — Telephone Encounter (Signed)
Gastroenterology Pre-Procedure Review  Request Date: Friday 08/01/19 Requesting Physician: Dr. Allen Norris  PATIENT REVIEW QUESTIONS: The patient responded to the following health history questions as indicated:    1. Are you having any GI issues? yes (Celiac disease, constipation) 2. Do you have a personal history of Polyps? yes (4years ago) 3. Do you have a family history of Colon Cancer or Polyps? mom lyphosarcoma 4. Diabetes Mellitus? no 5. Joint replacements in the past 12 months?no 6. Major health problems in the past 3 months?no 7. Any artificial heart valves, MVP, or defibrillator?no    MEDICATIONS & ALLERGIES:    Patient reports the following regarding taking any anticoagulation/antiplatelet therapy:   Plavix, Coumadin, Eliquis, Xarelto, Lovenox, Pradaxa, Brilinta, or Effient? no Aspirin? no  Patient confirms/reports the following medications:  Current Outpatient Medications  Medication Sig Dispense Refill  . ALPRAZolam (XANAX) 0.5 MG tablet Take 1 tablet (0.5 mg total) by mouth at bedtime. 90 tablet 1  . amLODipine (NORVASC) 5 MG tablet TAKE 1 TABLET BY MOUTH  DAILY 90 tablet 2  . Biotin 1000 MCG tablet Take 1,000 mcg by mouth daily.    . Calcium Carbonate (CALCIUM 600 PO) Take 600 mg by mouth daily at 3 pm.     . carvedilol (COREG) 3.125 MG tablet TAKE 1 TABLET BY MOUTH  TWICE DAILY 180 tablet 3  . Cholecalciferol (VITAMIN D3) 1000 units CAPS Take 1,000 Units by mouth.    . dicyclomine (BENTYL) 10 MG capsule TAKE 1 CAPSULE BY MOUTH 3  TIMES DAILY BEFORE MEALS 270 capsule 0  . hydroxypropyl methylcellulose / hypromellose (ISOPTO TEARS / GONIOVISC) 2.5 % ophthalmic solution Place 1 drop into both eyes 4 (four) times daily as needed for dry eyes (scheduled each morning & then as needed).    . latanoprost (XALATAN) 0.005 % ophthalmic solution INT 1 GTT IN OD 1 TIME IN THE EVE  5  . letrozole (FEMARA) 2.5 MG tablet Take 1 tablet (2.5 mg total) by mouth daily. 90 tablet 3  .  levothyroxine (SYNTHROID) 100 MCG tablet TAKE 1 TABLET BY MOUTH 30  MINUTES BEFORE BREAKFAST 90 tablet 3  . Multiple Vitamin (MULTIVITAMIN WITH MINERALS) TABS tablet Take 1 tablet by mouth daily at 3 pm.    . Omega-3 Fatty Acids (FISH OIL PO) Take 1 capsule by mouth daily.     [name] omeprazole (PRILOSEC) 40 MG capsule TAKE 1 CAPSULE BY MOUTH  DAILY 90 capsule 3  . polyethylene glycol (MIRALAX / GLYCOLAX) packet Take 17 g by mouth daily.    . sennosides-docusate sodium (SENOKOT-S) 8.6-50 MG tablet Take 1 tablet by mouth as needed for constipation.    . sertraline (ZOLOFT) 100 MG tablet TAKE 1 TABLET BY MOUTH  DAILY 90 tablet 0  . Simethicone (GAS-X PO) Take 1 tablet by mouth as needed.     No current facility-administered medications for this visit.    Patient confirms/reports the following allergies:  Allergies  Allergen Reactions  . Benadryl [Diphenhydramine Hcl (Sleep)] Other (See Comments)    Hyperactivity/causes opposite effect  . Advil [Ibuprofen] Rash    CAN TAKE NAPROSYN  . Claritin [Loratadine] Rash    No orders of the defined types were placed in this encounter.   AUTHORIZATION INFORMATION Primary Insurance: 1D#: Group #:  Secondary Insurance: 1D#: Group #:  SCHEDULE INFORMATION: Date: Friday 08/01/19 Time: Location:[facility]

## 2019-07-01 LAB — TSH: TSH: 1.41 u[IU]/mL (ref 0.450–4.500)

## 2019-07-01 LAB — CBC WITH DIFFERENTIAL/PLATELET
Basophils Absolute: 0 10*3/uL (ref 0.0–0.2)
Basos: 0 %
EOS (ABSOLUTE): 0 10*3/uL (ref 0.0–0.4)
Eos: 1 %
Hematocrit: 37.3 % (ref 34.0–46.6)
Hemoglobin: 12.3 g/dL (ref 11.1–15.9)
Immature Grans (Abs): 0 10*3/uL (ref 0.0–0.1)
Immature Granulocytes: 0 %
Lymphocytes Absolute: 1.8 10*3/uL (ref 0.7–3.1)
Lymphs: 35 %
MCH: 28.9 pg (ref 26.6–33.0)
MCHC: 33 g/dL (ref 31.5–35.7)
MCV: 88 fL (ref 79–97)
Monocytes Absolute: 0.6 10*3/uL (ref 0.1–0.9)
Monocytes: 11 %
Neutrophils Absolute: 2.8 10*3/uL (ref 1.4–7.0)
Neutrophils: 53 %
Platelets: 234 10*3/uL (ref 150–450)
RBC: 4.26 x10E6/uL (ref 3.77–5.28)
RDW: 12.8 % (ref 11.7–15.4)
WBC: 5.3 10*3/uL (ref 3.4–10.8)

## 2019-07-01 LAB — COMPREHENSIVE METABOLIC PANEL
ALT: 35 IU/L — ABNORMAL HIGH (ref 0–32)
AST: 27 IU/L (ref 0–40)
Albumin/Globulin Ratio: 1.8 (ref 1.2–2.2)
Albumin: 4.3 g/dL (ref 3.7–4.7)
Alkaline Phosphatase: 81 IU/L (ref 39–117)
BUN/Creatinine Ratio: 19 (ref 12–28)
BUN: 13 mg/dL (ref 8–27)
Bilirubin Total: 0.4 mg/dL (ref 0.0–1.2)
CO2: 27 mmol/L (ref 20–29)
Calcium: 9.2 mg/dL (ref 8.7–10.3)
Chloride: 104 mmol/L (ref 96–106)
Creatinine, Ser: 0.69 mg/dL (ref 0.57–1.00)
GFR calc Af Amer: 99 mL/min/{1.73_m2} (ref >59)
GFR calc non Af Amer: 86 mL/min/{1.73_m2} (ref >59)
Globulin, Total: 2.4 g/dL (ref 1.5–4.5)
Glucose: 97 mg/dL (ref 65–99)
Potassium: 3.9 mmol/L (ref 3.5–5.2)
Sodium: 143 mmol/L (ref 134–144)
Total Protein: 6.7 g/dL (ref 6.0–8.5)

## 2019-07-01 LAB — LIPID PANEL
Chol/HDL Ratio: 4 ratio (ref 0.0–4.4)
Cholesterol, Total: 234 mg/dL — ABNORMAL HIGH (ref 100–199)
HDL: 58 mg/dL (ref >39)
LDL Chol Calc (NIH): 144 mg/dL — ABNORMAL HIGH (ref 0–99)
Triglycerides: 179 mg/dL — ABNORMAL HIGH (ref 0–149)
VLDL Cholesterol Cal: 32 mg/dL (ref 5–40)

## 2019-07-03 ENCOUNTER — Telehealth: Payer: Self-pay

## 2019-07-03 NOTE — Telephone Encounter (Signed)
Patient advised on lab results. 

## 2019-07-21 ENCOUNTER — Ambulatory Visit
Admission: RE | Admit: 2019-07-21 | Discharge: 2019-07-21 | Disposition: A | Discharge status: Home / Self Care | Payer: Medicare Other | Source: Ambulatory Visit | Attending: Family Medicine | Admitting: Family Medicine

## 2019-07-21 DIAGNOSIS — M810 Age-related osteoporosis without current pathological fracture: Secondary | ICD-10-CM | POA: Diagnosis not present

## 2019-07-21 DIAGNOSIS — M8589 Other specified disorders of bone density and structure, multiple sites: Secondary | ICD-10-CM | POA: Diagnosis not present

## 2019-07-21 DIAGNOSIS — Z780 Asymptomatic menopausal state: Secondary | ICD-10-CM | POA: Diagnosis not present

## 2019-07-22 ENCOUNTER — Other Ambulatory Visit: Payer: Self-pay | Admitting: Family Medicine

## 2019-07-22 NOTE — Telephone Encounter (Signed)
Requested Prescriptions  Pending Prescriptions Disp Refills  . amLODipine (NORVASC) 5 MG tablet [Pharmacy Med Name: AMLODIPINE  5MG  TAB] 90 tablet 3    Sig: TAKE 1 TABLET BY MOUTH  DAILY     Cardiovascular:  Calcium Channel Blockers Passed - 07/22/2019 12:08 PM      Passed - Last BP in normal range    BP Readings from Last 1 Encounters:  06/30/19 131/74         Passed - Valid encounter within last 6 months    Recent Outpatient Visits          3 weeks ago Annual physical exam   [facility] Jerrol Banana., MD   8 months ago Acute pain of right knee   [facility] Jerrol Banana., MD   1 year ago Fever, unspecified fever cause   [facility] [place], Dionne Bucy, MD   1 year ago Annual physical exam   [facility] Jerrol Banana., MD   1 year ago Adjustment disorder, unspecified type   [facility] [place], [place], [place]             . sertraline (ZOLOFT) 100 MG tablet [Pharmacy Med Name: SERTRALINE HCL 100MG TABLET] 90 tablet 3    Sig: TAKE 1 TABLET BY MOUTH  DAILY     Psychiatry:  Antidepressants - SSRI Passed - 07/22/2019 12:08 PM      Passed - Completed PHQ-2 or PHQ-9 in the last 360 days.      Passed - Valid encounter within last 6 months    Recent Outpatient Visits          3 weeks ago Annual physical exam   [facility] Jerrol Banana., MD   8 months ago Acute pain of right knee   [facility] Jerrol Banana., MD   1 year ago Fever, unspecified fever cause   [facility] [place], Dionne Bucy, MD   1 year ago Annual physical exam   [facility] Jerrol Banana., MD   1 year ago Adjustment disorder, unspecified type   [place], [place]

## 2019-07-23 ENCOUNTER — Telehealth: Payer: Self-pay

## 2019-07-23 ENCOUNTER — Ambulatory Visit: Payer: Self-pay | Admitting: *Deleted

## 2019-07-23 ENCOUNTER — Telehealth: Payer: Self-pay | Admitting: *Deleted

## 2019-07-23 NOTE — Telephone Encounter (Signed)
Patient is requesting her Bone Density results from 07/21/2019. Results in chart. Please advise?

## 2019-07-23 NOTE — Telephone Encounter (Signed)
Copied from [place] [id number]. Topic: General - Other >> Jul 23, 2019  3:06 PM Leward Quan A wrote: Reason for CRM: Patient called to inquire about the result of her bone density test taken on 2/22/2. Complains of pain in her right leg, knee and hip with swelling around the knee making walking difficult. Please call patient at Ph# [phone]

## 2019-07-23 NOTE — Telephone Encounter (Signed)
Per initial encounter, "Patient called to complains of pain in her right leg, knee and hip with swelling around the knee making walking difficult"; the pt says this started a week ago; the swelling is on both sides of her knee cap; the pt also said that her ankles were swollen PM 2/223/21;she has not injury her leg; behind her knee is the most painful, and she rates the pain at least 5 when she is still; she has not taken anything for her pain; recommendations made per nurse triage protocol; she verbalized understanding but would like to be seen by her PCP; spoke with Jiles Garter ok to schedule pt with PCP on 07/24/19; pt previously scheduled with Adriana Pollalk 07/24/19 at 1040; will route to office for notification.  .  Reason for Disposition . [1] Thigh or calf pain AND [2] only 1 side AND [3] present > 1 hour  Answer Assessment - Initial Assessment Questions 1. ONSET: "When did the pain start?"     Around 07/16/19  2. LOCATION: "Where is the pain located?"     Back of right upper leg at hip joint, hip, and knee 3. PAIN: "How bad is the pain?"    (Scale 1-10; or mild, moderate, severe)   -  MILD (1-3): doesn't interfere with normal activities    -  MODERATE (4-7): interferes with normal activities (e.g., work or school) or awakens from sleep, limping    -  SEVERE (8-10): excruciating pain, unable to do any normal activities, unable to walk    5-9 out of 10 4. WORK OR EXERCISE: "Has there been any recent work or exercise that involved this part of the body?"      no 5. CAUSE: "What do you think is causing the leg pain?"    Not sure, ? Blood clot 6. OTHER SYMPTOMS: "Do you have any other symptoms?" (e.g., chest pain, back pain, breathing difficulty, swelling, rash, fever, numbness, weakness)  right lower back pain, swelling, rash on upper body x 1 day (last week not sure of the day) 7. PREGNANCY: "Is there any chance you are pregnant?" "When was your last menstrual period?"     no  Protocols used:  LEG PAIN-A-AH

## 2019-07-23 NOTE — Telephone Encounter (Signed)
Patient advised there are no available appointment Dr. Rosanna Randy. Patient is fine with keeping appt with Adriana.

## 2019-07-24 ENCOUNTER — Encounter: Payer: Self-pay | Admitting: Physician Assistant

## 2019-07-24 ENCOUNTER — Ambulatory Visit (INDEPENDENT_AMBULATORY_CARE_PROVIDER_SITE_OTHER): Payer: Medicare Other | Admitting: Physician Assistant

## 2019-07-24 ENCOUNTER — Other Ambulatory Visit: Payer: Self-pay

## 2019-07-24 VITALS — BP 132/84 | HR 87 | Temp 96.6°F | Wt 135.6 lb

## 2019-07-24 DIAGNOSIS — M1710 Unilateral primary osteoarthritis, unspecified knee: Secondary | ICD-10-CM | POA: Diagnosis not present

## 2019-07-24 NOTE — Telephone Encounter (Signed)
Please advise results? 

## 2019-07-24 NOTE — Patient Instructions (Signed)
I do not think this is a blood clot. If you would like to check, I am always happy to order an ultrasound. If your pain is worsening you can always return to this clinic for a steroid knee injection.    Arthritis Arthritis means joint pain. It can also mean joint disease. A joint is a place where bones come together. There are more than 100 types of arthritis. What are the causes? This condition may be caused by:  Wear and tear of a joint. This is the most common cause.  A lot of acid in the blood, which leads to pain in the joint (gout).  Pain and swelling (inflammation) in a joint.  Infection of a joint.  Injuries in the joint.  A reaction to medicines (allergy). In some cases, the cause may not be known. What are the signs or symptoms? Symptoms of this condition include:  Redness at a joint.  Swelling at a joint.  Stiffness at a joint.  Warmth coming from the joint.  A fever.  A feeling of being sick. How is this treated? This condition may be treated with:  Treating the cause, if it is known.  Rest.  Raising (elevating) the joint.  Putting cold or hot packs on the joint.  Medicines to treat symptoms and reduce pain and swelling.  Shots of medicines (cortisone) into the joint. You may also be told to make changes in your life, such as doing exercises and losing weight. Follow these instructions at home: Medicines  Take over-the-counter and prescription medicines only as told by your doctor.  Do not take aspirin for pain if your doctor says that you may have gout. Activity  Rest your joint if your doctor tells you to.  Avoid activities that make the pain worse.  Exercise your joint regularly as told by your doctor. Try doing exercises like: ? Swimming. ? Water aerobics. ? Biking. ? Walking. Managing pain, stiffness, and swelling      If told, put ice on the affected area. ? Put ice in a plastic bag. ? Place a towel between your skin and the  bag. ? Leave the ice on for 20 minutes, 2-3 times per day.  If your joint is swollen, raise (elevate) it above the level of your heart if told by your doctor.  If your joint feels stiff in the morning, try taking a warm shower.  If told, put heat on the affected area. Do this as often as told by your doctor. Use the heat source that your doctor recommends, such as a moist heat pack or a heating pad. If you have diabetes, do not apply heat without asking your doctor. To apply heat: ? Place a towel between your skin and the heat source. ? Leave the heat on for 20-30 minutes. ? Remove the heat if your skin turns bright red. This is very important if you are unable to feel pain, heat, or cold. You may have a greater risk of getting burned. General instructions  Do not use any products that contain nicotine or tobacco, such as cigarettes, e-cigarettes, and chewing tobacco. If you need help quitting, ask your doctor.  Keep all follow-up visits as told by your doctor. This is important. Contact a doctor if:  The pain gets worse.  You have a fever. Get help right away if:  You have very bad pain in your joint.  You have swelling in your joint.  Your joint is red.  Many joints become  painful and swollen.  You have very bad back pain.  Your leg is very weak.  You cannot control your pee (urine) or poop (stool). Summary  Arthritis means joint pain. It can also mean joint disease. A joint is a place where bones come together.  The most common cause of this condition is wear and tear of a joint.  Symptoms of this condition include redness, swelling, or stiffness of the joint.  This condition is treated with rest, raising the joint, medicines, and putting cold or hot packs on the joint.  Follow your doctor's instructions about medicines, activity, exercises, and other home care treatments. This information is not intended to replace advice given to you by your health care provider.  Make sure you discuss any questions you have with your health care provider. Document Revised: 04/22/2018 Document Reviewed: 04/22/2018 Elsevier Patient Education  2020 [employer].

## 2019-07-24 NOTE — Progress Notes (Signed)
Patient: Sandra [name] Female    DOB: June 27, 1945   74 y.o.   MRN: [id number] Visit Date: 07/24/2019  Today's Provider: Trinna Post, PA-C   Chief Complaint  Patient presents with   Knee Pain   Subjective:    I, Porsha McClurkin,CMA am acting as a scribe for [employer].   Knee Pain  The incident occurred more than 1 week ago. There was no injury mechanism. The pain is present in the right knee, right leg and right hip. The quality of the pain is described as aching, burning, cramping, shooting and stabbing. The pain is at a severity of 4/10 (Pain goes up to a 7). She reports no foreign bodies present. The symptoms are aggravated by movement and weight bearing. She has tried nothing for the symptoms. The treatment provided no relief.   Reports knee pain since longer than a year. She was last seen for this in 10/2018. She had an xray of her right knee at the time which showed mild arthritis. She was scheduled for a knee injection but ultimately cancelled because the knee was feeling better. Now she reports worsening knee pain ongoing for several weeks. She now reports her right leg is swollen as is her ankle. She reports the swelling is better in the morning after she sleeps. She has not taken anything for the issue. She is concerned this might be a blood clot.   Allergies  Allergen Reactions   Benadryl [Diphenhydramine Hcl (Sleep)] Other (See Comments)    Hyperactivity/causes opposite effect   Advil [Ibuprofen] Rash    CAN TAKE NAPROSYN   Claritin [Loratadine] Rash     Current Outpatient Medications:    ALPRAZolam (XANAX) 0.5 MG tablet, Take 1 tablet (0.5 mg total) by mouth at bedtime., Disp: 90 tablet, Rfl: 1   amLODipine (NORVASC) 5 MG tablet, TAKE 1 TABLET BY MOUTH  DAILY, Disp: 90 tablet, Rfl: 3   Biotin 1000 MCG tablet, Take 1,000 mcg by mouth daily., Disp: , Rfl:    Calcium Carbonate (CALCIUM 600 PO), Take 600 mg by mouth daily at 3 pm. , Disp: ,  Rfl:    carvedilol (COREG) 3.125 MG tablet, TAKE 1 TABLET BY MOUTH  TWICE DAILY, Disp: 180 tablet, Rfl: 3   Cholecalciferol (VITAMIN D3) 1000 units CAPS, Take 1,000 Units by mouth., Disp: , Rfl:    dicyclomine (BENTYL) 10 MG capsule, TAKE 1 CAPSULE BY MOUTH 3  TIMES DAILY BEFORE MEALS, Disp: 270 capsule, Rfl: 0   hydroxypropyl methylcellulose / hypromellose (ISOPTO TEARS / GONIOVISC) 2.5 % ophthalmic solution, Place 1 drop into both eyes 4 (four) times daily as needed for dry eyes (scheduled each morning & then as needed)., Disp: , Rfl:    latanoprost (XALATAN) 0.005 % ophthalmic solution, INT 1 GTT IN OD 1 TIME IN THE EVE, Disp: , Rfl: 5   letrozole (FEMARA) 2.5 MG tablet, Take 1 tablet (2.5 mg total) by mouth daily., Disp: 90 tablet, Rfl: 3   levothyroxine (SYNTHROID) 100 MCG tablet, TAKE 1 TABLET BY MOUTH 30  MINUTES BEFORE BREAKFAST, Disp: 90 tablet, Rfl: 3   Multiple Vitamin (MULTIVITAMIN WITH MINERALS) TABS tablet, Take 1 tablet by mouth daily at 3 pm., Disp: , Rfl:    Omega-3 Fatty Acids (FISH OIL PO), Take 1 capsule by mouth daily. , Disp: , Rfl:    omeprazole (PRILOSEC) 40 MG capsule, TAKE 1 CAPSULE BY MOUTH  DAILY, Disp: 90 capsule, Rfl: 3   sennosides-docusate  sodium (SENOKOT-S) 8.6-50 MG tablet, Take 1 tablet by mouth as needed for constipation., Disp: , Rfl:    sertraline (ZOLOFT) 100 MG tablet, TAKE 1 TABLET BY MOUTH  DAILY, Disp: 90 tablet, Rfl: 0   Simethicone (GAS-X PO), Take 1 tablet by mouth as needed., Disp: , Rfl:    polyethylene glycol (MIRALAX / GLYCOLAX) packet, Take 17 g by mouth daily., Disp: , Rfl:   Review of Systems  Social History   Tobacco Use   Smoking status: Never Smoker   Smokeless tobacco: Never Used  Substance Use Topics   Alcohol use: Yes    Comment: 0-2 glasses of wine a month      Objective:   BP 132/84 (BP Location: Left Arm, Patient Position: Sitting, Cuff Size: Normal)    Pulse 87    Temp (!) 96.6 F (35.9 C) (Temporal)    Wt  135 lb 9.6 oz (61.5 kg)    SpO2 97%    BMI 24.02 kg/m  Vitals:   07/24/19 1034  BP: 132/84  Pulse: 87  Temp: (!) 96.6 F (35.9 C)  TempSrc: Temporal  SpO2: 97%  Weight: 135 lb 9.6 oz (61.5 kg)  Body mass index is 24.02 kg/m.   Physical Exam Constitutional:      Appearance: Normal appearance.  Musculoskeletal:     Right knee: Effusion and crepitus present. Tenderness present over the lateral joint line.     Left knee: No effusion or crepitus. No tenderness.     Right ankle: Swelling present. No tenderness.     Left ankle: No swelling. No tenderness.     Comments: There is a joint effusion present in the right knee and some minimal swelling around the right medial malleolus. There is no swelling or redness of the calf. There is no tenderness with palpation of the DVT.   Neurological:     Mental Status: She is alert. Mental status is at baseline.  Psychiatric:        Mood and Affect: Mood normal.        Behavior: Behavior normal.      No results found for any visits on 07/24/19.     Assessment & Plan    1. Arthritis of knee  Suspect this is due to arthritis and not a DVT, however I offered an ultrasound for reassurance. Patient declines at this time. Recommend tylenol for arthritis pain. May rest and use ICE. Can also use Voltaren gel and sparing ibuprofen. I have reviewed her right knee xray from 10/2018. If not improving she may return to the office for a steroid injection.  The entirety of the information documented in the History of Present Illness, Review of Systems and Physical Exam were personally obtained by me. Portions of this information were initially documented by [facility] and reviewed by me for thoroughness and accuracy.   I have spent 25 minutes with this patient, 50% of which was spent on counseling and coordination of care.      Trinna Post, PA-C  [facility]

## 2019-07-30 ENCOUNTER — Telehealth: Payer: Self-pay | Admitting: Gastroenterology

## 2019-07-30 ENCOUNTER — Other Ambulatory Visit
Admission: RE | Admit: 2019-07-30 | Discharge: 2019-07-30 | Disposition: A | Discharge status: Home / Self Care | Payer: Medicare Other | Source: Ambulatory Visit | Attending: Gastroenterology | Admitting: Gastroenterology

## 2019-07-30 ENCOUNTER — Other Ambulatory Visit: Payer: Self-pay

## 2019-07-30 DIAGNOSIS — Z01812 Encounter for preprocedural laboratory examination: Secondary | ICD-10-CM | POA: Insufficient documentation

## 2019-07-30 DIAGNOSIS — Z20822 Contact with and (suspected) exposure to covid-19: Secondary | ICD-10-CM | POA: Diagnosis not present

## 2019-07-30 LAB — SARS CORONAVIRUS 2 (TAT 6-24 HRS): SARS Coronavirus 2: NEGATIVE

## 2019-07-30 NOTE — Telephone Encounter (Signed)
Patients call has been returned.  She was unsure what time to go for her COVID test, however she has already had it done.  No other questions or concerns at this time.  Thanks,  [place], [place]

## 2019-07-30 NOTE — Telephone Encounter (Signed)
Pt left vm she has a Covid test today and needs a call regarding timing of it

## 2019-07-31 ENCOUNTER — Telehealth: Payer: Self-pay | Admitting: Gastroenterology

## 2019-07-31 NOTE — Telephone Encounter (Signed)
Pt left vm she has a procedure with Dr. Allen Norris tomorrow and has a question please call pt

## 2019-07-31 NOTE — Telephone Encounter (Signed)
Ginger has already addressed patients questions regarding her colonoscopy.  Thanks,  [place], [place]

## 2019-08-01 ENCOUNTER — Encounter
Admission: RE | Disposition: A | Discharge status: Home / Self Care | Payer: Self-pay | Source: Home / Self Care | Attending: Gastroenterology

## 2019-08-01 ENCOUNTER — Ambulatory Visit: Payer: Medicare Other | Admitting: Anesthesiology

## 2019-08-01 ENCOUNTER — Ambulatory Visit
Admission: RE | Admit: 2019-08-01 | Discharge: 2019-08-01 | Disposition: A | Discharge status: Home / Self Care | Payer: Medicare Other | Attending: Gastroenterology | Admitting: Gastroenterology

## 2019-08-01 DIAGNOSIS — D122 Benign neoplasm of ascending colon: Secondary | ICD-10-CM | POA: Diagnosis not present

## 2019-08-01 DIAGNOSIS — Z79899 Other long term (current) drug therapy: Secondary | ICD-10-CM | POA: Diagnosis not present

## 2019-08-01 DIAGNOSIS — K635 Polyp of colon: Secondary | ICD-10-CM | POA: Diagnosis not present

## 2019-08-01 DIAGNOSIS — Z170 Estrogen receptor positive status [ER+]: Secondary | ICD-10-CM | POA: Insufficient documentation

## 2019-08-01 DIAGNOSIS — Z860100 Personal history of colon polyps, unspecified: Secondary | ICD-10-CM

## 2019-08-01 DIAGNOSIS — K640 First degree hemorrhoids: Secondary | ICD-10-CM | POA: Insufficient documentation

## 2019-08-01 DIAGNOSIS — Z79890 Hormone replacement therapy (postmenopausal): Secondary | ICD-10-CM | POA: Insufficient documentation

## 2019-08-01 DIAGNOSIS — K219 Gastro-esophageal reflux disease without esophagitis: Secondary | ICD-10-CM | POA: Diagnosis not present

## 2019-08-01 DIAGNOSIS — Z79811 Long term (current) use of aromatase inhibitors: Secondary | ICD-10-CM | POA: Insufficient documentation

## 2019-08-01 DIAGNOSIS — K900 Celiac disease: Secondary | ICD-10-CM | POA: Diagnosis not present

## 2019-08-01 DIAGNOSIS — Z803 Family history of malignant neoplasm of breast: Secondary | ICD-10-CM | POA: Insufficient documentation

## 2019-08-01 DIAGNOSIS — Z9011 Acquired absence of right breast and nipple: Secondary | ICD-10-CM | POA: Insufficient documentation

## 2019-08-01 DIAGNOSIS — E039 Hypothyroidism, unspecified: Secondary | ICD-10-CM | POA: Insufficient documentation

## 2019-08-01 DIAGNOSIS — C50411 Malignant neoplasm of upper-outer quadrant of right female breast: Secondary | ICD-10-CM | POA: Insufficient documentation

## 2019-08-01 DIAGNOSIS — K648 Other hemorrhoids: Secondary | ICD-10-CM | POA: Diagnosis not present

## 2019-08-01 DIAGNOSIS — M1990 Unspecified osteoarthritis, unspecified site: Secondary | ICD-10-CM | POA: Diagnosis not present

## 2019-08-01 DIAGNOSIS — H409 Unspecified glaucoma: Secondary | ICD-10-CM | POA: Insufficient documentation

## 2019-08-01 DIAGNOSIS — D123 Benign neoplasm of transverse colon: Secondary | ICD-10-CM | POA: Diagnosis not present

## 2019-08-01 DIAGNOSIS — D125 Benign neoplasm of sigmoid colon: Secondary | ICD-10-CM | POA: Diagnosis not present

## 2019-08-01 DIAGNOSIS — Z1211 Encounter for screening for malignant neoplasm of colon: Secondary | ICD-10-CM | POA: Diagnosis not present

## 2019-08-01 DIAGNOSIS — F329 Major depressive disorder, single episode, unspecified: Secondary | ICD-10-CM | POA: Insufficient documentation

## 2019-08-01 DIAGNOSIS — F419 Anxiety disorder, unspecified: Secondary | ICD-10-CM | POA: Diagnosis not present

## 2019-08-01 DIAGNOSIS — I7300 Raynaud's syndrome without gangrene: Secondary | ICD-10-CM | POA: Diagnosis not present

## 2019-08-01 DIAGNOSIS — I10 Essential (primary) hypertension: Secondary | ICD-10-CM | POA: Diagnosis not present

## 2019-08-01 HISTORY — PX: COLONOSCOPY WITH PROPOFOL: SHX5780

## 2019-08-01 SURGERY — COLONOSCOPY WITH PROPOFOL
Anesthesia: General

## 2019-08-01 MED ORDER — SODIUM CHLORIDE 0.9 % IV SOLN
INTRAVENOUS | Status: DC
  Administered 2019-08-01: 1000 mL via INTRAVENOUS

## 2019-08-01 MED ORDER — PROPOFOL 500 MG/50ML IV EMUL
INTRAVENOUS | Status: DC | PRN
  Administered 2019-08-01: 140 ug/kg/min via INTRAVENOUS

## 2019-08-01 MED ORDER — PROPOFOL 10 MG/ML IV BOLUS
INTRAVENOUS | Status: DC | PRN
  Administered 2019-08-01: 70 mg via INTRAVENOUS

## 2019-08-01 NOTE — Transfer of Care (Signed)
Immediate Anesthesia Transfer of Care Note  Patient: Sandra [name]  Procedure(s) Performed: COLONOSCOPY WITH PROPOFOL (N/A )  Patient Location: PACU  Anesthesia Type:General  Level of Consciousness: awake, alert  and oriented  Airway & Oxygen Therapy: Patient Spontanous Breathing  Post-op Assessment: Report given to RN and Post -op Vital signs reviewed and stable  Post vital signs: Reviewed and stable  Last Vitals:  Vitals Value Taken Time  BP    Temp    Pulse    Resp    SpO2      Last Pain:  Vitals:   08/01/19 0755  TempSrc: Temporal  PainSc: 10-Worst pain ever         Complications: No apparent anesthesia complications

## 2019-08-01 NOTE — Op Note (Signed)
[facility] Gastroenterology Patient Name: Sandra [name] Procedure Date: 08/01/2019 8:30 AM MRN: [id number] Account #: [id number] Date of Birth: 01-14-1946 Admit Type: Outpatient Age: 74 Room: [facility] ENDO ROOM 4 Gender: Female Note Status: Finalized Procedure:             Colonoscopy Indications:           High risk colon cancer surveillance: Personal history                         of colonic polyps Providers:             Lucilla Lame MD, MD Referring MD:          Janine Ores. Rosanna Randy, MD (Referring MD) Medicines:             Propofol per Anesthesia Complications:         No immediate complications. Procedure:             Pre-Anesthesia Assessment:                        - Prior to the procedure, a History and Physical was                         performed, and patient medications and allergies were                         reviewed. The patient's tolerance of previous                         anesthesia was also reviewed. The risks and benefits                         of the procedure and the sedation options and risks                         were discussed with the patient. All questions were                         answered, and informed consent was obtained. Prior                         Anticoagulants: The patient has taken no previous                         anticoagulant or antiplatelet agents. ASA Grade                         Assessment: II - A patient with mild systemic disease.                         After reviewing the risks and benefits, the patient                         was deemed in satisfactory condition to undergo the                         procedure.  After obtaining informed consent, the colonoscope was                         passed under direct vision. Throughout the procedure,                         the patient's blood pressure, pulse, and oxygen                         saturations were monitored continuously. The                          Colonoscope was introduced through the anus and                         advanced to the the cecum, identified by appendiceal                         orifice and ileocecal valve. The colonoscopy was                         performed without difficulty. The patient tolerated                         the procedure well. The quality of the bowel                         preparation was good. Findings:      The perianal and digital rectal examinations were normal.      A 4 mm polyp was found in the hepatic flexure. The polyp was sessile.       The polyp was removed with a cold snare. Resection and retrieval were       complete.      Four sessile polyps were found in the transverse colon. The polyps were       3 to 5 mm in size. These polyps were removed with a cold snare.       Resection and retrieval were complete.      A 3 mm polyp was found in the sigmoid colon. The polyp was sessile. The       polyp was removed with a cold snare. Resection and retrieval were       complete.      Non-bleeding internal hemorrhoids were found during retroflexion. The       hemorrhoids were Grade I (internal hemorrhoids that do not prolapse). Impression:            - One 4 mm polyp at the hepatic flexure, removed with                         a cold snare. Resected and retrieved.                        - Four 3 to 5 mm polyps in the transverse colon,                         removed with a cold snare. Resected and retrieved.                        -  One 3 mm polyp in the sigmoid colon, removed with a                         cold snare. Resected and retrieved.                        - Non-bleeding internal hemorrhoids. Recommendation:        - Discharge patient to home.                        - Resume previous diet.                        - Continue present medications.                        - Await pathology results.                        - Repeat colonoscopy in 3 years for  surveillance. Procedure Code(s):     --- Professional ---                        [phone], Colonoscopy, flexible; with removal of                         tumor(s), polyp(s), or other lesion(s) by snare                         technique Diagnosis Code(s):     --- Professional ---                        Z86.010, Personal history of colonic polyps                        K63.5, Polyp of colon CPT copyright 2019 American Medical Association. All rights reserved. The codes documented in this report are preliminary and upon coder review may  be revised to meet current compliance requirements. Lucilla Lame MD, MD 08/01/2019 8:50:32 AM This report has been signed electronically. Number of Addenda: 0 Note Initiated On: 08/01/2019 8:30 AM Scope Withdrawal Time: 0 hours 9 minutes 10 seconds  Total Procedure Duration: 0 hours 11 minutes 1 second  Estimated Blood Loss:  Estimated blood loss: none.      [facility]

## 2019-08-01 NOTE — Anesthesia Postprocedure Evaluation (Signed)
Anesthesia Post Note  Patient: Sandra [name]  Procedure(s) Performed: COLONOSCOPY WITH PROPOFOL (N/A )  Patient location during evaluation: Endoscopy Anesthesia Type: General Level of consciousness: awake and alert Pain management: pain level controlled Vital Signs Assessment: post-procedure vital signs reviewed and stable Respiratory status: spontaneous breathing, nonlabored ventilation, respiratory function stable and patient connected to nasal cannula oxygen Cardiovascular status: blood pressure returned to baseline and stable Postop Assessment: no apparent nausea or vomiting Anesthetic complications: no     Last Vitals:  Vitals:   08/01/19 0852 08/01/19 0902  BP: (!) 135/55 130/67  Pulse: 66   Resp: 16   Temp: 36.8 C   SpO2: 95%     Last Pain:  Vitals:   08/01/19 0902  TempSrc:   PainSc: 0-No pain                 Arita Miss

## 2019-08-01 NOTE — H&P (Signed)
Sandra Lame, MD [address] [place], [place] [address] Phone:[phone] Fax : [phone]  Primary Care Physician:  Jerrol Banana., MD Primary Gastroenterologist:  Dr. Allen Norris  Pre-Procedure History & Physical: HPI:  Sandra [name] is a 74 y.o. female is here for an colonoscopy.   Past Medical History:  Diagnosis Date  . Abnormality of left ventricle of heart    MILD-MODERATE THICKENING PER ECHO ON 01-18-17  . Anemia   . Anxiety   . Arthritis   . BRCA gene mutation negative 10/2016   NEGATIVE: [facility]  . Breast cancer ([place]) 10/16/2016   T1c,N0;, GRADE I/III, 1.6cm. ER/PR pos  HER2 not over expressed, Right Upper Outer  . Celiac disease   . Depression   . Diastolic dysfunction   . Dyspnea    WITH EXERTION  . GERD (gastroesophageal reflux disease)    OCC  . Glaucoma    right eye  . Headache    H/O MIGRAINES AS TEENAGER  . Heart murmur    PT WAS TOLD ONCE SHE HAD MVP AND THEN ANOTHER MD SAID SHE DID NOT HAVE MVP  . Hypothyroidism   . Pneumonia    YEARS AGO  . Raynaud's disease   . RLS (restless legs syndrome)     Past Surgical History:  Procedure Laterality Date  . ABDOMINAL HYSTERECTOMY    . APPENDECTOMY    . AXILLARY LYMPH NODE BIOPSY Right 03/05/2017   Procedure: AXILLARY LYMPH NODE BIOPSY;  Surgeon: Robert Bellow, MD;  Location: [facility] ORS;  Service: General;  Laterality: Right;  . CATARACT EXTRACTION Bilateral   . CESAREAN SECTION    . COLONOSCOPY WITH PROPOFOL N/A 05/19/2015   Procedure: COLONOSCOPY WITH PROPOFOL;  Surgeon: Manya Silvas, MD;  Location: [facility] ENDOSCOPY;  Service: Endoscopy;  Laterality: N/A;  . EYE SURGERY    . HAND SURGERY Left   . MASTECTOMY Right 11/07/2016   RESIDUAL INVASIVE MAMMARY CARCINOMA, SUBAREOLAR ANTERIOR TO PREVIOUS   . SENTINEL NODE BIOPSY Right 11/07/2016   Procedure: SENTINEL NODE BIOPSY;  Surgeon: Robert Bellow, MD;  Location: [facility] ORS;  Service: General;  Laterality: Right;  . SIMPLE  MASTECTOMY WITH AXILLARY SENTINEL NODE BIOPSY Right 11/07/2016   6 mm ER/PR 100%; Her 2 neu not overexpressed, T1b, N0.  Surgeon: Robert Bellow, MD;  Location: [facility] ORS;  Service: General;  Laterality: Right;  . TONSILLECTOMY  AGE 49  . UPPER GI ENDOSCOPY  01/12/06   hiatus hernia    Prior to Admission medications   Medication Sig Start Date End Date Taking? Authorizing Provider  ALPRAZolam Duanne Moron) 0.5 MG tablet Take 1 tablet (0.5 mg total) by mouth at bedtime. 02/18/19  Yes Jerrol Banana., MD  amLODipine (NORVASC) 5 MG tablet TAKE 1 TABLET BY MOUTH  DAILY 07/22/19  Yes Jerrol Banana., MD  Biotin 1000 MCG tablet Take 1,000 mcg by mouth daily.   Yes [provider]  Calcium Carbonate (CALCIUM 600 PO) Take 600 mg by mouth daily at 3 pm.    Yes [provider]  carvedilol (COREG) 3.125 MG tablet TAKE 1 TABLET BY MOUTH  TWICE DAILY 02/21/19  Yes Jerrol Banana., MD  Cholecalciferol (VITAMIN D3) 1000 units CAPS Take 1,000 Units by mouth.   Yes [provider]  dicyclomine (BENTYL) 10 MG capsule TAKE 1 CAPSULE BY MOUTH 3  TIMES DAILY BEFORE MEALS 05/26/19  Yes Jerrol Banana., MD  hydroxypropyl methylcellulose / hypromellose (  ISOPTO TEARS / GONIOVISC) 2.5 % ophthalmic solution Place 1 drop into both eyes 4 (four) times daily as needed for dry eyes (scheduled each morning & then as needed).   Yes [provider]  latanoprost (XALATAN) 0.005 % ophthalmic solution INT 1 GTT IN OD 1 TIME IN THE EVE 02/06/17  Yes [provider]  letrozole (FEMARA) 2.5 MG tablet Take 1 tablet (2.5 mg total) by mouth daily. 09/23/18  Yes Byrnett, Forest Gleason, MD  levothyroxine (SYNTHROID) 100 MCG tablet TAKE 1 TABLET BY MOUTH 30  MINUTES BEFORE BREAKFAST 03/15/19  Yes Jerrol Banana., MD  Multiple Vitamin (MULTIVITAMIN WITH MINERALS) TABS tablet Take 1 tablet by mouth daily at 3 pm.   Yes [provider]  Omega-3 Fatty Acids (FISH OIL PO)  Take 1 capsule by mouth daily.    Yes [provider]  omeprazole (PRILOSEC) 40 MG capsule TAKE 1 CAPSULE BY MOUTH  DAILY 12/05/18  Yes Jerrol Banana., MD  polyethylene glycol [facility] / Floria Raveling) packet Take 17 g by mouth daily.   Yes [provider]  sennosides-docusate sodium (SENOKOT-S) 8.6-50 MG tablet Take 1 tablet by mouth as needed for constipation.   Yes [provider]  sertraline (ZOLOFT) 100 MG tablet TAKE 1 TABLET BY MOUTH  DAILY 05/26/19  Yes Jerrol Banana., MD  Simethicone (GAS-X PO) Take 1 tablet by mouth as needed.   Yes [provider]    Allergies as of 07/01/2019 - Review Complete 06/30/2019  Allergen Reaction Noted  . Benadryl [diphenhydramine hcl (sleep)] Other (See Comments) 01/06/2015  . Advil [ibuprofen] Rash 01/06/2015  . Claritin [loratadine] Rash 01/06/2015    Family History  Problem Relation Age of Onset  . Cancer Mother   . Anemia Mother   . Other Mother        lymphosarcoma  . Alcohol abuse Father   . Prostate cancer Father   . Breast cancer Sister        73; 1/2 sister, shared mother.  . Cancer Sister        bile duct  . Stroke Maternal Grandmother   . Breast cancer Maternal Grandmother 68  . Heart attack Maternal Grandfather   . CAD Paternal Grandfather     Social History   Socioeconomic History  . Marital status: Married    Spouse name: Not on file  . Number of children: 2  . Years of education: Not on file  . Highest education level: Some college, no degree  Occupational History  . Occupation: retired  Tobacco Use  . Smoking status: Never Smoker  . Smokeless tobacco: Never Used  Substance and Sexual Activity  . Alcohol use: Yes    Comment: 0-2 glasses of wine a month  . Drug use: No  . Sexual activity: Not Currently    Birth control/protection: None  Other Topics Concern  . Not on file  Social History Narrative  . Not on file   Social Determinants of Health   Financial  Resource Strain:   . Difficulty of Paying Living Expenses: Not on file  Food Insecurity:   . Worried About Charity fundraiser in the Last Year: Not on file  . Ran Out of Food in the Last Year: Not on file  Transportation Needs:   . Lack of Transportation (Medical): Not on file  . Lack of Transportation (Non-Medical): Not on file  Physical Activity:   . Days of Exercise per Week: Not on file  .  Minutes of Exercise per Session: Not on file  Stress: No Stress Concern Present  . Feeling of Stress : Not at all  Social Connections:   . Frequency of Communication with Friends and Family: Not on file  . Frequency of Social Gatherings with Friends and Family: Not on file  . Attends Religious Services: Not on file  . Active Member of Clubs or Organizations: Not on file  . Attends Archivist Meetings: Not on file  . Marital Status: Not on file  Intimate Partner Violence:   . Fear of Current or Ex-Partner: Not on file  . Emotionally Abused: Not on file  . Physically Abused: Not on file  . Sexually Abused: Not on file    Review of Systems: See HPI, otherwise negative ROS  Physical Exam: BP 137/72   Pulse 66   Temp (!) 97.3 F (36.3 C) (Temporal)   Resp 18   Ht 5' 3"  (1.6 m)   Wt 61.2 kg   SpO2 98%   BMI 23.91 kg/m  General:   Alert,  pleasant and cooperative in NAD Head:  Normocephalic and atraumatic. Neck:  Supple; no masses or thyromegaly. Lungs:  Clear throughout to auscultation.    Heart:  Regular rate and rhythm. Abdomen:  Soft, nontender and nondistended. Normal bowel sounds, without guarding, and without rebound.   Neurologic:  Alert and  oriented x4;  grossly normal neurologically.  Impression/Plan: Sandra [name] is here for an colonoscopy to be performed for history of adenomatous polyps 12/06/2017  Risks, benefits, limitations, and alternatives regarding  colonoscopy have been reviewed with the patient.  Questions have been answered.  All parties  agreeable.   Sandra Lame, MD  08/01/2019, 8:20 AM

## 2019-08-01 NOTE — Anesthesia Preprocedure Evaluation (Signed)
Anesthesia Evaluation  Patient identified by MRN, date of birth, ID band Patient awake    Reviewed: Allergy & Precautions, H&P , NPO status , Patient's Chart, lab work & pertinent test results, reviewed documented beta blocker date and time   Airway Mallampati: II   Neck ROM: full    Dental  (+) Poor Dentition   Pulmonary shortness of breath and with exertion, pneumonia, resolved,    Pulmonary exam normal        Cardiovascular Exercise Tolerance: Good hypertension, On Medications Normal cardiovascular exam+ Valvular Problems/Murmurs  Rhythm:regular Rate:Normal     Neuro/Psych  Headaches, PSYCHIATRIC DISORDERS Anxiety Depression    GI/Hepatic Neg liver ROS, GERD  Medicated,  Endo/Other  Hypothyroidism   Renal/GU negative Renal ROS  negative genitourinary   Musculoskeletal   Abdominal   Peds  Hematology  (+) Blood dyscrasia, anemia ,   Anesthesia Other Findings Past Medical History: No date: Abnormality of left ventricle of heart     Comment:  MILD-MODERATE THICKENING PER ECHO ON 01-18-17 No date: Anemia No date: Anxiety No date: Arthritis 10/2016: BRCA gene mutation negative     Comment:  NEGATIVE: [facility] 10/16/2016: Breast cancer ([place])     Comment:  T1c,N0;, GRADE I/III, 1.6cm. ER/PR pos  HER2 not over               expressed, Right Upper Outer No date: Celiac disease No date: Depression No date: Diastolic dysfunction No date: Dyspnea     Comment:  WITH EXERTION No date: GERD (gastroesophageal reflux disease)     Comment:  OCC No date: Glaucoma     Comment:  right eye No date: Headache     Comment:  H/O MIGRAINES AS TEENAGER No date: Heart murmur     Comment:  PT WAS TOLD ONCE SHE HAD MVP AND THEN ANOTHER MD SAID               SHE DID NOT HAVE MVP No date: Hypothyroidism No date: Pneumonia     Comment:  YEARS AGO No date: Raynaud's disease No date: RLS (restless legs syndrome) Past Surgical  History: No date: ABDOMINAL HYSTERECTOMY No date: APPENDECTOMY 03/05/2017: AXILLARY LYMPH NODE BIOPSY; Right     Comment:  Procedure: AXILLARY LYMPH NODE BIOPSY;  Surgeon:               Robert Bellow, MD;  Location: [facility] ORS;  Service:               General;  Laterality: Right; No date: CATARACT EXTRACTION; Bilateral No date: CESAREAN SECTION 05/19/2015: COLONOSCOPY WITH PROPOFOL; N/A     Comment:  Procedure: COLONOSCOPY WITH PROPOFOL;  Surgeon: Manya Silvas, MD;  Location: [facility] ENDOSCOPY;  Service:               Endoscopy;  Laterality: N/A; No date: EYE SURGERY No date: HAND SURGERY; Left 11/07/2016: MASTECTOMY; Right     Comment:  RESIDUAL INVASIVE MAMMARY CARCINOMA, SUBAREOLAR ANTERIOR              TO PREVIOUS  11/07/2016: SENTINEL NODE BIOPSY; Right     Comment:  Procedure: SENTINEL NODE BIOPSY;  Surgeon: Robert Bellow, MD;  Location: [facility] ORS;  Service: General;                Laterality: Right; 11/07/2016:  SIMPLE MASTECTOMY WITH AXILLARY SENTINEL NODE BIOPSY; Right     Comment:  6 mm ER/PR 100%; Her 2 neu not overexpressed, T1b, N0.                Surgeon: Robert Bellow, MD;  Location: [facility] ORS;                Service: General;  Laterality: Right; AGE 74: TONSILLECTOMY 01/12/06: UPPER GI ENDOSCOPY     Comment:  hiatus hernia   Reproductive/Obstetrics negative OB ROS                             Anesthesia Physical Anesthesia Plan  ASA: III  Anesthesia Plan: General   Post-op Pain Management:    Induction:   PONV Risk Score and Plan:   Airway Management Planned:   Additional Equipment:   Intra-op Plan:   Post-operative Plan:   Informed Consent: I have reviewed the patients History and Physical, chart, labs and discussed the procedure including the risks, benefits and alternatives for the proposed anesthesia with the patient or authorized representative who has indicated his/her understanding  and acceptance.     Dental Advisory Given  Plan Discussed with: CRNA  Anesthesia Plan Comments:         Anesthesia Quick Evaluation

## 2019-08-04 ENCOUNTER — Encounter: Payer: Self-pay | Admitting: Gastroenterology

## 2019-08-04 ENCOUNTER — Encounter: Payer: Self-pay | Admitting: *Deleted

## 2019-08-04 LAB — SURGICAL PATHOLOGY

## 2019-08-11 ENCOUNTER — Other Ambulatory Visit: Payer: Self-pay | Admitting: Family Medicine

## 2019-08-11 DIAGNOSIS — F419 Anxiety disorder, unspecified: Secondary | ICD-10-CM

## 2019-08-11 NOTE — Telephone Encounter (Signed)
Medication Refill - Medication:  ALPRAZolam (XANAX) 0.5 MG tablet   Has the patient contacted their pharmacy?  Yes advised to call office.  Preferred Pharmacy (with phone number or street name):  [place], [place] [employer] Phone:  [phone]  Fax:  [phone]     Agent: Please be advised that RX refills may take up to 3 business days. We ask that you follow-up with your pharmacy.

## 2019-08-11 NOTE — Telephone Encounter (Signed)
Requested medication (s) are due for refill today:yes  Requested medication (s) are on the active medication list: yes  Last refill: 02/18/19   #90  1 refill  Future visit scheduled yes  Notes to clinic:not delegated  Requested Prescriptions  Pending Prescriptions Disp Refills   ALPRAZolam (XANAX) 0.5 MG tablet 90 tablet 1    Sig: Take 1 tablet (0.5 mg total) by mouth at bedtime.      Not Delegated - Psychiatry:  Anxiolytics/Hypnotics Failed - 08/11/2019  4:04 PM      Failed - This refill cannot be delegated      Failed - Urine Drug Screen completed in last 360 days.      Passed - Valid encounter within last 6 months    Recent Outpatient Visits           2 weeks ago Arthritis of knee   [place], [place], [place]   1 month ago Annual physical exam   [facility] Jerrol Banana., MD   9 months ago Acute pain of right knee   [facility] Jerrol Banana., MD   1 year ago Fever, unspecified fever cause   [facility] [place], Dionne Bucy, MD   1 year ago Annual physical exam   [facility] Jerrol Banana., MD

## 2019-08-13 MED ORDER — ALPRAZOLAM 0.5 MG PO TABS: 0.5000 mg | ORAL_TABLET | Freq: Every day | ORAL | 1 refills | Status: DC

## 2019-08-18 ENCOUNTER — Ambulatory Visit
Admission: RE | Admit: 2019-08-18 | Discharge: 2019-08-18 | Disposition: A | Discharge status: Home / Self Care | Payer: Medicare Other | Source: Ambulatory Visit | Attending: Family Medicine | Admitting: Family Medicine

## 2019-08-18 ENCOUNTER — Other Ambulatory Visit: Payer: Self-pay

## 2019-08-18 ENCOUNTER — Ambulatory Visit: Payer: Self-pay | Admitting: *Deleted

## 2019-08-18 ENCOUNTER — Ambulatory Visit (INDEPENDENT_AMBULATORY_CARE_PROVIDER_SITE_OTHER): Payer: Medicare Other | Admitting: Family Medicine

## 2019-08-18 ENCOUNTER — Encounter: Payer: Self-pay | Admitting: Family Medicine

## 2019-08-18 VITALS — BP 125/68 | HR 68 | Temp 96.8°F | Wt 137.0 lb

## 2019-08-18 DIAGNOSIS — M79661 Pain in right lower leg: Secondary | ICD-10-CM

## 2019-08-18 DIAGNOSIS — M25461 Effusion, right knee: Secondary | ICD-10-CM

## 2019-08-18 DIAGNOSIS — M7989 Other specified soft tissue disorders: Secondary | ICD-10-CM | POA: Insufficient documentation

## 2019-08-18 DIAGNOSIS — M7121 Synovial cyst of popliteal space [Baker], right knee: Secondary | ICD-10-CM | POA: Diagnosis not present

## 2019-08-18 NOTE — Progress Notes (Signed)
Patient: Sandra [name] Female    DOB: 05-08-1946   74 y.o.   MRN: [id number] Visit Date: 08/18/2019  Today's Provider: Lavon Paganini, MD   Chief Complaint  Patient presents with  . Leg Pain  . Edema   Subjective:     Leg Pain  There was no injury mechanism. The pain is present in the right leg. Associated symptoms include a loss of motion and muscle weakness. Pertinent negatives include no inability to bear weight, loss of sensation, numbness or tingling. She has tried NSAIDs for the symptoms.   Was seen on 2/25 for leg pain and swelling and thought to be related to knee OA.  Has worsened since that time with more calf swelling and knee pain.  No h/o VTE, + h/o breast cancer  Allergies  Allergen Reactions  . Benadryl [Diphenhydramine Hcl (Sleep)] Other (See Comments)    Hyperactivity/causes opposite effect  . Advil [Ibuprofen] Rash    CAN TAKE NAPROSYN  . Claritin [Loratadine] Rash     Current Outpatient Medications:  .  ALPRAZolam (XANAX) 0.5 MG tablet, Take 1 tablet (0.5 mg total) by mouth at bedtime., Disp: 90 tablet, Rfl: 1 .  amLODipine (NORVASC) 5 MG tablet, TAKE 1 TABLET BY MOUTH  DAILY, Disp: 90 tablet, Rfl: 3 .  Biotin 1000 MCG tablet, Take 1,000 mcg by mouth daily., Disp: , Rfl:  .  Calcium Carbonate (CALCIUM 600 PO), Take 600 mg by mouth daily at 3 pm. , Disp: , Rfl:  .  carvedilol (COREG) 3.125 MG tablet, TAKE 1 TABLET BY MOUTH  TWICE DAILY, Disp: 180 tablet, Rfl: 3 .  Cholecalciferol (VITAMIN D3) 1000 units CAPS, Take 1,000 Units by mouth., Disp: , Rfl:  .  dicyclomine (BENTYL) 10 MG capsule, TAKE 1 CAPSULE BY MOUTH 3  TIMES DAILY BEFORE MEALS, Disp: 270 capsule, Rfl: 0 .  hydroxypropyl methylcellulose / hypromellose (ISOPTO TEARS / GONIOVISC) 2.5 % ophthalmic solution, Place 1 drop into both eyes 4 (four) times daily as needed for dry eyes (scheduled each morning & then as needed)., Disp: , Rfl:  .  latanoprost (XALATAN) 0.005 % ophthalmic solution, INT 1  GTT IN OD 1 TIME IN THE EVE, Disp: , Rfl: 5 .  letrozole (FEMARA) 2.5 MG tablet, Take 1 tablet (2.5 mg total) by mouth daily., Disp: 90 tablet, Rfl: 3 .  levothyroxine (SYNTHROID) 100 MCG tablet, TAKE 1 TABLET BY MOUTH 30  MINUTES BEFORE BREAKFAST, Disp: 90 tablet, Rfl: 3 .  Multiple Vitamin (MULTIVITAMIN WITH MINERALS) TABS tablet, Take 1 tablet by mouth daily at 3 pm., Disp: , Rfl:  .  Omega-3 Fatty Acids (FISH OIL PO), Take 1 capsule by mouth daily. , Disp: , Rfl:  .  omeprazole (PRILOSEC) 40 MG capsule, TAKE 1 CAPSULE BY MOUTH  DAILY, Disp: 90 capsule, Rfl: 3 .  sennosides-docusate sodium (SENOKOT-S) 8.6-50 MG tablet, Take 1 tablet by mouth as needed for constipation., Disp: , Rfl:  .  sertraline (ZOLOFT) 100 MG tablet, TAKE 1 TABLET BY MOUTH  DAILY, Disp: 90 tablet, Rfl: 0 .  Simethicone (GAS-X PO), Take 1 tablet by mouth as needed., Disp: , Rfl:  .  polyethylene glycol (MIRALAX / GLYCOLAX) packet, Take 17 g by mouth daily., Disp: , Rfl:   Review of Systems  Constitutional: Negative.   Cardiovascular: Positive for leg swelling. Negative for chest pain and palpitations.  Musculoskeletal: Positive for arthralgias, joint swelling and myalgias. Negative for back pain, gait problem, neck pain and neck  stiffness.  Neurological: Negative for tingling and numbness.    Social History   Tobacco Use  . Smoking status: Never Smoker  . Smokeless tobacco: Never Used  Substance Use Topics  . Alcohol use: Yes    Comment: 0-2 glasses of wine a month      Objective:   BP 125/68 (BP Location: Left Arm, Patient Position: Sitting, Cuff Size: Normal)   Pulse 68   Temp (!) 96.8 F (36 C) (Temporal)   Wt 137 lb (62.1 kg)   BMI 24.27 kg/m  Vitals:   08/18/19 1456  BP: 125/68  Pulse: 68  Temp: (!) 96.8 F (36 C)  TempSrc: Temporal  Weight: 137 lb (62.1 kg)  Body mass index is 24.27 kg/m.   Physical Exam Vitals reviewed.  Constitutional:      General: She is not in acute distress.     Appearance: Normal appearance. She is not diaphoretic.  HENT:     Head: Normocephalic and atraumatic.  Eyes:     Conjunctiva/sclera: Conjunctivae normal.  Cardiovascular:     Rate and Rhythm: Normal rate and regular rhythm.     Pulses: Normal pulses.     Heart sounds: Normal heart sounds. No murmur.  Pulmonary:     Effort: Pulmonary effort is normal. No respiratory distress.     Breath sounds: Normal breath sounds. No wheezing or rhonchi.  Musculoskeletal:     Comments: R calf noticeably larger in size than L with pitting edema 1+.  Effusion of right knee with limited flexion.  Ligaments are stable with solid endpoints.  Tenderness on medial joint line.  No patellar tenderness.  Baker's cyst palpated posteriorly.  Right calf is tender to palpation posteriorly and she has positive Homans' sign.  Skin:    General: Skin is warm and dry.     Findings: No rash.  Neurological:     Mental Status: She is alert and oriented to person, place, and time. Mental status is at baseline.     Sensory: No sensory deficit.     Motor: No weakness.     Gait: Gait abnormal (antalgic).  Psychiatric:        Mood and Affect: Mood normal.        Behavior: Behavior normal.      No results found for any visits on 08/18/19.     Assessment & Plan    1. Tenderness of right calf 2. Calf swelling -Ongoing and worsening problem with obvious calf swelling of the right compared to the left with asymmetry on exam today -She also has calf tenderness and positive Homans' sign -Given history of cancer, she is also at higher risk for DVT -Will obtain stat venous Doppler today to rule out DVT -Discussed that there is no DVT on Doppler, then it is likely that this is dependent edema related to her knee problem as below -If there is DVT found, we will need to start anticoagulation today - US Venous Img Lower Unilateral Right; Future  3. Synovial cyst of right popliteal space 4. Effusion of right knee -New problem  on exam today -Ongoing pain for more than 1 month -No history of trauma or injury -Concerning for significant knee OA -Likely needs aspiration and corticosteroid injection -Will refer to Ortho for this -Advised compression, elevation, ice, rest, NSAIDs as needed in the meantime -No evidence of infection Return precautions discussed - Ambulatory referral to Orthopedic Surgery   Follow-up as needed  The entirety of the information documented in  the History of Present Illness, Review of Systems and Physical Exam were personally obtained by me. Portions of this information were initially documented by Ashley Royalty, CMA and reviewed by me for thoroughness and accuracy.    Rhythm Wigfall, Dionne Bucy, MD MPH [facility]

## 2019-08-18 NOTE — Telephone Encounter (Signed)
Patient states she was seen for leg pain in Feb and advised it was arthritis- patient states she was offered [place] - but felt that if arthritis- it was not necessary. Patient states in the meantime- her leg has gotten worse and she fears she may have a clot. Patient states she has pain in the calf and swelling in the leg and foot. She state elevation does help- but it is not going away. Appointment scheduled today per protocol with provider available.  Reason for Disposition . [1] Thigh, calf, or ankle swelling AND [2] only 1 side  Answer Assessment - Initial Assessment Questions 1. ONSET: "When did the pain start?"      R leg pain- early Feb- leg is worse- pain in hip too 2. LOCATION: "Where is the pain located?"      R leg- knee swelling 3. PAIN: "How bad is the pain?"    (Scale 1-10; or mild, moderate, severe)   -  MILD (1-3): doesn't interfere with normal activities    -  MODERATE (4-7): interferes with normal activities (e.g., work or school) or awakens from sleep, limping    -  SEVERE (8-10): excruciating pain, unable to do any normal activities, unable to walk     Pain in back of calve 4. WORK OR EXERCISE: "Has there been any recent work or exercise that involved this part of the body?"      n/a 5. CAUSE: "What do you think is causing the leg pain?"     Patient thinks she has blood clot 6. OTHER SYMPTOMS: "Do you have any other symptoms?" (e.g., chest pain, back pain, breathing difficulty, swelling, rash, fever, numbness, weakness)     Rash/red, hard to walk on leg with swelling 7. PREGNANCY: "Is there any chance you are pregnant?" "When was your last menstrual period?"     n/a  Protocols used: LEG PAIN-A-AH

## 2019-08-19 ENCOUNTER — Telehealth: Payer: Self-pay

## 2019-08-19 ENCOUNTER — Other Ambulatory Visit: Payer: Self-pay | Admitting: Family Medicine

## 2019-08-19 NOTE — Telephone Encounter (Signed)
-----   Message from Virginia Crews, MD sent at 08/18/2019  4:58 PM EDT ----- No DVT.  Pain and swelling are from knee pain

## 2019-08-19 NOTE — Telephone Encounter (Signed)
[employer]   Result Notes and Comments to Patient Comment seen by patient Sandra [name] on 08/18/2019 10:12 PM EDT

## 2019-08-19 NOTE — Telephone Encounter (Signed)
Requested Prescriptions  Pending Prescriptions Disp Refills  . sertraline (ZOLOFT) 100 MG tablet [Pharmacy Med Name: SERTRALINE HCL 100MG TABLET] 90 tablet 1    Sig: TAKE 1 TABLET BY MOUTH  DAILY     Psychiatry:  Antidepressants - SSRI Passed - 08/19/2019  4:59 AM      Passed - Completed PHQ-2 or PHQ-9 in the last 360 days.      Passed - Valid encounter within last 6 months    Recent Outpatient Visits          Yesterday Tenderness of right calf   [facility] [place], Dionne Bucy, MD   3 weeks ago Arthritis of knee   [place], [place], [place]   1 month ago Annual physical exam   [facility] Jerrol Banana., MD   9 months ago Acute pain of right knee   [facility] Jerrol Banana., MD   1 year ago Fever, unspecified fever cause   [facility], Dionne Bucy, MD

## 2019-08-29 DIAGNOSIS — M7061 Trochanteric bursitis, right hip: Secondary | ICD-10-CM | POA: Diagnosis not present

## 2019-08-29 DIAGNOSIS — M1711 Unilateral primary osteoarthritis, right knee: Secondary | ICD-10-CM | POA: Diagnosis not present

## 2019-08-30 NOTE — Progress Notes (Signed)
Cardiology Office Note    Date:  09/02/2019   ID:  Sandra [name], DOB 12/24/1945, MRN [id number]  PCP:  Jerrol Banana., MD  Cardiologist:  Nelva Bush, MD  Electrophysiologist:  None   Chief Complaint: RLE swelling/dyspnea/weight gain  History of Present Illness:   Sandra [name] is a 74 y.o. female with history of HFpEF, right-sided breast cancer status post mastectomy without chemoradiation, celiac disease, Raynaud's disease, hypothyroidism, RLS, GERD, and anxiety who presents for evaluation of dyspnea, weight gain, and right lower extremity swelling.  She was previously evaluated by Dr. Clayborn Bigness for preoperative evaluation in the setting of right mastectomy in the summer 2018.  She was found to have an abnormal EKG and cleared for surgery without additional testing.  She indicated she had abnormal EKGs dating back to 2012 at which time she was evaluated by Dr. Ubaldo Glassing and underwent stress test which was normal per her report.  She has subsequently established with Dr. [name] in 12/2016.  Echo on 01/18/2017 showed an EF of 60 to 65%, mild concentric LVH with moderate hypertrophy of the septum with near cavity obliteration with systole with no significant outflow tract gradient measured, no regional wall motion abnormalities, grade 2 diastolic dysfunction, mild mitral regurgitation, mildly dilated left atrium, normal RV systolic function and RVSP.  Treadmill MPI in 01/2017 showed a hypertensive response to exercise with no significant ischemia or infarct.  LVEF was hyperdynamic at greater than 65%.  She was last seen in the office in 11/2017 for follow-up of DOE and was doing well with stable symptoms.  She noted improvement in her BP with the addition of carvedilol and amlodipine.  Follow-up was recommended on an as-needed basis.  More recently, she was evaluated by her PCP on 08/18/2019 for follow-up of leg pain and swelling involving the right lower extremity felt to be related to her  history of OA.  It was felt she may need corticosteroid injection with possible aspiration of synovial cyst involving the right popliteal space.  Lower extremity venous ultrasound was negative for DVT.  She was referred to orthopedics who felt her symptoms were related to bursitis of the right hip and osteoarthritis of the right knee.    She comes in today noting a several month history of exertional dyspnea, particularly when walking up an incline, abdominal distention/swelling, weight gain, and intermittent right lower extremity swelling.  Her weight today is up 7 pounds when compared to her visit with [place] in 11/2017.  She denies any orthopnea, PND, or early satiety.  Shortness of breath is related to ambulating up an incline.  There has also been some associated bendopnea.  No chest pain or palpitations.  With regards to her right lower extremity swelling, lower extremity venous duplex was negative for DVT as outlined above.  She has been evaluated by orthopedics and found to have bursitis of the right hip and or osteoarthritis of the right knee.  Symptoms have subsequently improved.     Labs independently reviewed: 06/2019 -TC 234, TG 179, HDL 58, LDL 144, TSH normal, potassium 3.9, BUN 13, serum creatinine 0.69, albumin 4.3, AST normal, ALT 35, Hgb 12.3, PLT 234  Past Medical History:  Diagnosis Date   Abnormality of left ventricle of heart    MILD-MODERATE THICKENING PER ECHO ON 01-18-17   Anemia    Anxiety    Arthritis    BRCA gene mutation negative 10/2016   NEGATIVE: [facility]   Breast cancer ([place]) 10/16/2016  T1c,N0;, GRADE I/III, 1.6cm. ER/PR pos  HER2 not over expressed, Right Upper Outer   Celiac disease    Depression    Diastolic dysfunction    Dyspnea    WITH EXERTION   GERD (gastroesophageal reflux disease)    OCC   Glaucoma    right eye   Headache    H/O MIGRAINES AS TEENAGER   Heart murmur    PT WAS TOLD ONCE SHE HAD MVP AND THEN ANOTHER MD SAID SHE DID NOT  HAVE MVP   Hypothyroidism    Pneumonia    YEARS AGO   Raynaud's disease    RLS (restless legs syndrome)     Past Surgical History:  Procedure Laterality Date   ABDOMINAL HYSTERECTOMY     APPENDECTOMY     AXILLARY LYMPH NODE BIOPSY Right 03/05/2017   Procedure: AXILLARY LYMPH NODE BIOPSY;  Surgeon: Robert Bellow, MD;  Location: [facility];  Service: General;  Laterality: Right;   CATARACT EXTRACTION Bilateral    CESAREAN SECTION     COLONOSCOPY WITH PROPOFOL N/A 05/19/2015   Procedure: COLONOSCOPY WITH PROPOFOL;  Surgeon: Manya Silvas, MD;  Location: [facility];  Service: Endoscopy;  Laterality: N/A;   COLONOSCOPY WITH PROPOFOL N/A 08/01/2019   Procedure: COLONOSCOPY WITH PROPOFOL;  Surgeon: Lucilla Lame, MD;  Location: [facility];  Service: Endoscopy;  Laterality: N/A;   EYE SURGERY     HAND SURGERY Left    MASTECTOMY Right 11/07/2016   RESIDUAL INVASIVE MAMMARY CARCINOMA, SUBAREOLAR ANTERIOR TO PREVIOUS    SENTINEL NODE BIOPSY Right 11/07/2016   Procedure: SENTINEL NODE BIOPSY;  Surgeon: Robert Bellow, MD;  Location: [facility];  Service: General;  Laterality: Right;   SIMPLE MASTECTOMY WITH AXILLARY SENTINEL NODE BIOPSY Right 11/07/2016   6 mm ER/PR 100%; Her 2 neu not overexpressed, T1b, N0.  Surgeon: Robert Bellow, MD;  Location: [facility];  Service: General;  Laterality: Right;   TONSILLECTOMY  AGE 5   UPPER GI ENDOSCOPY  01/12/06   hiatus hernia    Current Medications: Current Meds  Medication Sig   ALPRAZolam (XANAX) 0.5 MG tablet Take 1 tablet (0.5 mg total) by mouth at bedtime.   amLODipine (NORVASC) 5 MG tablet TAKE 1 TABLET BY MOUTH  DAILY   Biotin 1000 MCG tablet Take 1,000 mcg by mouth daily.   Calcium Carbonate (CALCIUM 600 PO) Take 600 mg by mouth daily at 3 pm.    carvedilol (COREG) 3.125 MG tablet TAKE 1 TABLET BY MOUTH  TWICE DAILY   Cholecalciferol (VITAMIN D3) 1000 units CAPS Take 1,000 Units by mouth.    dicyclomine (BENTYL) 10 MG capsule TAKE 1 CAPSULE BY MOUTH 3  TIMES DAILY BEFORE MEALS   hydroxypropyl methylcellulose / hypromellose (ISOPTO TEARS / GONIOVISC) 2.5 % ophthalmic solution Place 1 drop into both eyes 4 (four) times daily as needed for dry eyes (scheduled each morning & then as needed).   latanoprost (XALATAN) 0.005 % ophthalmic solution INT 1 GTT IN OD 1 TIME IN THE EVE   letrozole (FEMARA) 2.5 MG tablet Take 1 tablet (2.5 mg total) by mouth daily.   levothyroxine (SYNTHROID) 100 MCG tablet TAKE 1 TABLET BY MOUTH 30  MINUTES BEFORE BREAKFAST   Multiple Vitamin (MULTIVITAMIN WITH MINERALS) TABS tablet Take 1 tablet by mouth daily at 3 pm.   Omega-3 Fatty Acids (FISH OIL PO) Take 1 capsule by mouth daily.    omeprazole (PRILOSEC) 40 MG capsule TAKE 1 CAPSULE BY MOUTH  DAILY  sennosides-docusate sodium (SENOKOT-S) 8.6-50 MG tablet Take 1 tablet by mouth as needed for constipation.   sertraline (ZOLOFT) 100 MG tablet TAKE 1 TABLET BY MOUTH  DAILY   Simethicone (GAS-X PO) Take 1 tablet by mouth as needed.    Allergies:   Benadryl [diphenhydramine hcl (sleep)], Advil [ibuprofen], and Claritin [loratadine]   Social History   Socioeconomic History   Marital status: Married    Spouse name: Not on file   Number of children: 2   Years of education: Not on file   Highest education level: Some college, no degree  Occupational History   Occupation: retired  Tobacco Use   Smoking status: Never Smoker   Smokeless tobacco: Never Used  Substance and Sexual Activity   Alcohol use: Yes    Comment: 0-2 glasses of wine a month   Drug use: No   Sexual activity: Not Currently    Birth control/protection: None  Other Topics Concern   Not on file  Social History Narrative   Not on file   Social Determinants of Health   Financial Resource Strain:    Difficulty of Paying Living Expenses:   Food Insecurity:    Worried About Charity fundraiser in the Last Year:     Arboriculturist in the Last Year:   Transportation Needs:    Film/video editor (Medical):    Lack of Transportation (Non-Medical):   Physical Activity:    Days of Exercise per Week:    Minutes of Exercise per Session:   Stress: No Stress Concern Present   Feeling of Stress : Not at all  Social Connections:    Frequency of Communication with Friends and Family:    Frequency of Social Gatherings with Friends and Family:    Attends Religious Services:    Active Member of Clubs or Organizations:    Attends Archivist Meetings:    Marital Status:      Family History:  The patient's family history includes Alcohol abuse in her father; Anemia in her mother; Breast cancer in her sister; Breast cancer (age of onset: 11) in her maternal grandmother; CAD in her paternal grandfather; Cancer in her mother and sister; Heart attack in her maternal grandfather; Other in her mother; Prostate cancer in her father; Stroke in her maternal grandmother.  ROS:   Review of Systems  Constitutional: Positive for malaise/fatigue. Negative for chills, diaphoresis, fever and weight loss.       Weight gain  HENT: Negative for congestion.   Eyes: Negative for discharge and redness.  Respiratory: Positive for shortness of breath. Negative for cough, sputum production and wheezing.   Cardiovascular: Positive for leg swelling. Negative for chest pain, palpitations, orthopnea, claudication and PND.       Intermittent right lower extremity swelling  Gastrointestinal: Negative for abdominal pain, heartburn, nausea and vomiting.       Abdominal distention  Musculoskeletal: Positive for joint pain. Negative for falls and myalgias.  Skin: Negative for rash.  Neurological: Negative for dizziness, tingling, tremors, sensory change, speech change, focal weakness, loss of consciousness and weakness.  Endo/Heme/Allergies: Does not bruise/bleed easily.  Psychiatric/Behavioral: Negative for  substance abuse. The patient is not nervous/anxious.   All other systems reviewed and are negative.    EKGs/Labs/Other Studies Reviewed:    Studies reviewed were summarized above. The additional studies were reviewed today:  2D echo 12/2016: - Left ventricle: The cavity size was normal. There was mild  concentric LVH with moderate  hypertrophy of the septum, 1.65 cm.  Near cavity obliteration with systole. No significant outflow  tract gradient measured. Systolic function was normal. The  estimated ejection fraction was in the range of 60% to 65%. Wall  motion was normal; there were no regional wall motion  abnormalities. Features are consistent with a pseudonormal left  ventricular filling pattern, with concomitant abnormal relaxation  and increased filling pressure (grade 2 diastolic dysfunction).  - Mitral valve: There was mild regurgitation.  - Left atrium: The atrium was mildly dilated.  - Right ventricle: Systolic function was normal.  - Pulmonary arteries: Systolic pressure was within the normal  range.  __________  Carlton Adam MPI 01/2017:  Blood pressure demonstrated a hypertensive response to exercise with elevated BP at baseline.  The study is normal.  This is a low risk study.  The left ventricular ejection fraction is hyperdynamic (>65%).  Non diagnostic ECG due to abnormal baseline.   EKG:  EKG is ordered today.  The EKG ordered today demonstrates NSR, 65 bpm, LVH with repolarization abnormality with diffuse T wave inversion that is slightly more pronounced today when compared to prior study though was previously noted along the anterior lateral leads  Recent Labs: 06/30/2019: ALT 35; BUN 13; Creatinine, Ser 0.69; Hemoglobin 12.3; Platelets 234; Potassium 3.9; Sodium 143; TSH 1.410  Recent Lipid Panel    Component Value Date/Time   CHOL 234 (H) 06/30/2019 1145   TRIG 179 (H) 06/30/2019 1145   HDL 58 06/30/2019 1145   CHOLHDL 4.0 06/30/2019 1145    LDLCALC 144 (H) 06/30/2019 1145    PHYSICAL EXAM:    VS:  BP 130/70 (BP Location: Left Arm, Patient Position: Sitting, Cuff Size: Normal)    Pulse 65    Ht 5' 3" (1.6 m)    Wt 138 lb 6 oz (62.8 kg)    SpO2 99%    BMI 24.51 kg/m   BMI: Body mass index is 24.51 kg/m.  Physical Exam  Constitutional: She is oriented to person, place, and time. She appears well-developed and well-nourished.  HENT:  Head: Normocephalic and atraumatic.  Eyes: Right eye exhibits no discharge. Left eye exhibits no discharge.  Neck: No JVD present.  Cardiovascular: Normal rate, regular rhythm, S1 normal and S2 normal. Exam reveals no distant heart sounds, no friction rub, no midsystolic click and no opening snap.  Murmur heard. Pulses:      Posterior tibial pulses are 2+ on the right side and 2+ on the left side.  I/VI systolic murmur best heard in the RUSB  Pulmonary/Chest: Effort normal and breath sounds normal. No respiratory distress. She has no decreased breath sounds. She has no wheezes. She has no rales. She exhibits no tenderness.  Abdominal: Soft. She exhibits no distension. There is no abdominal tenderness.  Musculoskeletal:        General: Edema present.     Cervical back: Normal range of motion.     Comments: Trace swelling involving the right knee. Images reviewed from patient's phone which do show significant right lower extremity swelling previously.  Neurological: She is alert and oriented to person, place, and time.  Skin: Skin is warm and dry. No cyanosis. Nails show no clubbing.  Psychiatric: She has a normal mood and affect. Her speech is normal and behavior is normal. Judgment and thought content normal.    Wt Readings from Last 3 Encounters:  09/02/19 138 lb 6 oz (62.8 kg)  08/18/19 137 lb (62.1 kg)  08/01/19 135 lb (61.2  kg)     ASSESSMENT & PLAN:   1. HFpEF/dyspnea/abnormal EKG: She does not appear grossly volume overloaded and is well compensated.  Her weight is up 7 pounds  when compared to her visit from 2019.  Some of this is likely related to increased caloric intake and sedentary lifestyle in the setting of the COVID-19 pandemic.  Nonetheless, she does note exertional dyspnea when ambulating up an incline.  Her EKG has been abnormal for several years though there are new changes today when compared to prior as outlined above.  We will start with an echo.  If this demonstrates preserved LV systolic function we will plan for coronary CTA.  If she is found to have a new cardiomyopathy on surface echo we will plan for diagnostic R/LHC.   2. Intermittent right lower extremity swelling: Attributed to bursitis of the right hip and osteoarthritis of the right knee.  This is less likely related to her amlodipine as only 1 a lower extremity is affected.  Can also not exclude potential obstructing lesion in the abdominal cavity playing a role in her swelling.  This can be evaluated by her PCP.  3. HTN: Blood pressure is reasonably controlled today.  Continue carvedilol and amlodipine.  Low-sodium diet.  4. HLD: LDL of 144 from 06/2019.  Not currently on a statin.  Following ischemic work-up as outlined above, we will plan to start a statin.  This was deferred at today's visit as newly initiated statin therapy can lead to remodeling of atherosclerotic disease which can falsely elevate calcium scores on potential pending coronary CTA.  Disposition: F/u with Dr. [name] or an APP in 2 months.   Medication Adjustments/Labs and Tests Ordered: Current medicines are reviewed at length with the patient today.  Concerns regarding medicines are outlined above. Medication changes, Labs and Tests ordered today are summarized above and listed in the Patient Instructions accessible in Encounters.   Signed, Christell Faith, PA-C 09/02/2019 9:39 AM     [facility] [address] [place], [place] [address] [phone]

## 2019-09-02 ENCOUNTER — Ambulatory Visit (INDEPENDENT_AMBULATORY_CARE_PROVIDER_SITE_OTHER): Payer: Medicare Other | Admitting: Physician Assistant

## 2019-09-02 ENCOUNTER — Encounter: Payer: Self-pay | Admitting: Physician Assistant

## 2019-09-02 ENCOUNTER — Other Ambulatory Visit: Payer: Self-pay

## 2019-09-02 VITALS — BP 130/70 | HR 65 | Ht 63.0 in | Wt 138.4 lb

## 2019-09-02 DIAGNOSIS — E785 Hyperlipidemia, unspecified: Secondary | ICD-10-CM | POA: Diagnosis not present

## 2019-09-02 DIAGNOSIS — R9431 Abnormal electrocardiogram [ECG] [EKG]: Secondary | ICD-10-CM

## 2019-09-02 DIAGNOSIS — R2241 Localized swelling, mass and lump, right lower limb: Secondary | ICD-10-CM

## 2019-09-02 DIAGNOSIS — R0609 Other forms of dyspnea: Secondary | ICD-10-CM

## 2019-09-02 DIAGNOSIS — I10 Essential (primary) hypertension: Secondary | ICD-10-CM

## 2019-09-02 DIAGNOSIS — R0600 Dyspnea, unspecified: Secondary | ICD-10-CM | POA: Diagnosis not present

## 2019-09-02 DIAGNOSIS — I5032 Chronic diastolic (congestive) heart failure: Secondary | ICD-10-CM | POA: Diagnosis not present

## 2019-09-02 NOTE — Patient Instructions (Signed)
Medication Instructions:  No changes *If you need a refill on your cardiac medications before your next appointment, please call your pharmacy*   Lab Work: None ordered  If you have labs (blood work) drawn today and your tests are completely normal, you will receive your results only by: [name] MyChart Message (if you have MyChart) OR . A paper copy in the mail If you have any lab test that is abnormal or we need to change your treatment, we will call you to review the results.   Testing/Procedures: 1- Echo  Please return to [facility] on ______________ at _______________ AM/PM for an Echocardiogram. Your physician has requested that you have an echocardiogram. Echocardiography is a painless test that uses sound waves to create images of your heart. It provides your doctor with information about the size and shape of your heart and how well your heart's chambers and valves are working. This procedure takes approximately one hour. There are no restrictions for this procedure. Please note; depending on visual quality an IV may need to be placed.     Follow-Up: At [facility], you and your health needs are our priority.  As part of our continuing mission to provide you with exceptional heart care, we have created designated Provider Care Teams.  These Care Teams include your primary Cardiologist (physician) and Advanced Practice Providers (APPs -  Physician Assistants and Nurse Practitioners) who all work together to provide you with the care you need, when you need it.  We recommend signing up for the patient portal called "MyChart".  Sign up information is provided on this After Visit Summary.  MyChart is used to connect with patients for Virtual Visits (Telemedicine).  Patients are able to view lab/test results, encounter notes, upcoming appointments, etc.  Non-urgent messages can be sent to your provider as well.   To learn more about what you can do with MyChart, go to  [email or web address].    Your next appointment:   2 month(s)  The format for your next appointment:   In Person  Provider:    You may see Nelva Bush, MD or Christell Faith, PA-C.

## 2019-09-12 ENCOUNTER — Ambulatory Visit (INDEPENDENT_AMBULATORY_CARE_PROVIDER_SITE_OTHER): Payer: Medicare Other

## 2019-09-12 ENCOUNTER — Other Ambulatory Visit: Payer: Self-pay

## 2019-09-12 DIAGNOSIS — R0600 Dyspnea, unspecified: Secondary | ICD-10-CM

## 2019-09-12 DIAGNOSIS — R0609 Other forms of dyspnea: Secondary | ICD-10-CM

## 2019-09-12 MED ORDER — PERFLUTREN LIPID MICROSPHERE
1.0000 mL | INTRAVENOUS | Status: AC | PRN
  Administered 2019-09-12: 2 mL via INTRAVENOUS

## 2019-09-15 ENCOUNTER — Telehealth: Payer: Self-pay

## 2019-09-15 DIAGNOSIS — R0600 Dyspnea, unspecified: Secondary | ICD-10-CM

## 2019-09-15 DIAGNOSIS — R0609 Other forms of dyspnea: Secondary | ICD-10-CM

## 2019-09-15 MED ORDER — METOPROLOL TARTRATE 100 MG PO TABS: 100.0000 mg | ORAL_TABLET | Freq: Once | ORAL | 0 refills | Status: DC

## 2019-09-15 NOTE — Telephone Encounter (Signed)
Patient returning call.

## 2019-09-15 NOTE — Telephone Encounter (Signed)
Attempted to call patient. [facility] 09/15/2019

## 2019-09-15 NOTE — Telephone Encounter (Signed)
Call to patient to review results of echo. Orders placed. Sent to precert. Written instructions sent to patient via mychart.   Your cardiac CT will be scheduled at one of the below locations:   [facility] [address] [place], [place] [address] [phone]  [facility] [address] [place], [place] [address] [phone]  If scheduled at [facility], please arrive at the [facility] main entrance of [facility] 30 minutes prior to test start time. Proceed to the [facility] Radiology Department (first floor) to check-in and test prep.  If scheduled at [facility], please arrive 15 mins early for check-in and test prep.  Please follow these instructions carefully (unless otherwise directed):   On the Night Before the Test: . Be sure to Drink plenty of water. . Do not consume any caffeinated/decaffeinated beverages or chocolate 12 hours prior to your test. . Do not take any antihistamines 12 hours prior to your test.  On the Day of the Test: . Drink plenty of water. Do not drink any water within one hour of the test. . Do not eat any food 4 hours prior to the test. . You may take your regular medications prior to the test.  . Take metoprolol (Lopressor) two hours prior to test. . FEMALES- please wear underwire-free bra if available        After the Test: . Drink plenty of water. . After receiving IV contrast, you may experience a mild flushed feeling. This is normal. . On occasion, you may experience a mild rash up to 24 hours after the test. This is not dangerous. If this occurs, you can take Benadryl 25 mg and increase your fluid intake. . If you experience trouble breathing, this can be serious. If it is severe call 911 IMMEDIATELY. If it is mild, please call our office. . If you take any of these medications: Glipizide/Metformin, Avandament, Glucavance, please do  not take 48 hours after completing test unless otherwise instructed.   Once we have confirmed authorization from your insurance company, we will call you to set up a date and time for your test.   For non-scheduling related questions, please contact the cardiac imaging nurse navigator should you have any questions/concerns: Marchia Bond, RN Navigator Cardiac Imaging [facility] [phone] office  For scheduling needs, including cancellations and rescheduling, please call [phone].

## 2019-09-15 NOTE — Telephone Encounter (Signed)
-----   Message from Rise Mu, PA-C sent at 09/14/2019  9:40 AM EDT ----- Echo showed normal pump function, normal wall motion, slight stiffening of the heart, normal pressure in the right side of the heart, and a mildly to moderately leaky mitral valve.   Recommendations: -Optimal BP control (which was reasonably controlled at her last visit) -Given her exertional dyspnea, please proceed with coronary CTA. This was discussed with her at her visit -Her leaky mitral valve was previously noted, though is a little more leaky. This can be monitored with periodic echo

## 2019-09-17 ENCOUNTER — Other Ambulatory Visit: Payer: Self-pay | Admitting: Family Medicine

## 2019-09-17 DIAGNOSIS — R1084 Generalized abdominal pain: Secondary | ICD-10-CM

## 2019-09-26 ENCOUNTER — Telehealth: Payer: Self-pay | Admitting: Internal Medicine

## 2019-09-26 NOTE — Telephone Encounter (Signed)
Patient calling to check the status of having CT scheduled

## 2019-09-26 NOTE — Telephone Encounter (Signed)
Gordy Clement.Daleen Bo, RN; Lorenza Evangelist, RN; Zeb Comfort, RN  Patient scheduled for May 6,2021 1230p she is aware to have her lab work prior to testing

## 2019-09-26 NOTE — Telephone Encounter (Signed)
Staff message sent to the Pre-Cert pool to see if the they are still waiting on an authorization for the patient's Cardiac CT. The order was just placed on 09/15/19.   Awaiting a response.

## 2019-09-29 ENCOUNTER — Encounter: Payer: Self-pay | Admitting: Family Medicine

## 2019-09-29 ENCOUNTER — Other Ambulatory Visit
Admission: RE | Admit: 2019-09-29 | Discharge: 2019-09-29 | Disposition: A | Discharge status: Home / Self Care | Payer: Medicare Other | Source: Ambulatory Visit | Attending: Family | Admitting: Family

## 2019-09-29 DIAGNOSIS — R0600 Dyspnea, unspecified: Secondary | ICD-10-CM | POA: Diagnosis not present

## 2019-09-29 DIAGNOSIS — R0609 Other forms of dyspnea: Secondary | ICD-10-CM

## 2019-09-29 LAB — BASIC METABOLIC PANEL
Anion gap: 8 (ref 5–15)
BUN: 14 mg/dL (ref 8–23)
CO2: 29 mmol/L (ref 22–32)
Calcium: 8.7 mg/dL — ABNORMAL LOW (ref 8.9–10.3)
Chloride: 103 mmol/L (ref 98–111)
Creatinine, Ser: 0.76 mg/dL (ref 0.44–1.00)
GFR calc Af Amer: >60 mL/min (ref >60)
GFR calc non Af Amer: >60 mL/min (ref >60)
Glucose, Bld: 117 mg/dL — ABNORMAL HIGH (ref 70–99)
Potassium: 3.7 mmol/L (ref 3.5–5.1)
Sodium: 140 mmol/L (ref 135–145)

## 2019-09-30 ENCOUNTER — Telehealth: Payer: Self-pay | Admitting: Family Medicine

## 2019-09-30 ENCOUNTER — Telehealth (HOSPITAL_COMMUNITY): Payer: Self-pay | Admitting: Emergency Medicine

## 2019-09-30 NOTE — Telephone Encounter (Signed)
Reaching out to patient to offer assistance regarding upcoming cardiac imaging study; pt verbalizes understanding of appt date/time, parking situation and where to check in, pre-test NPO status and medications ordered, and verified current allergies; name and call back number provided for further questions should they arise Marchia Bond RN [facility] [phone] office [phone] cell  Pt calling seeking verification that CCTA on 10/02/19 has been authorized- confirmed.  Also asking if she needs ride to/from appt, told her she should be okay to drive self. No sedatives will be given for exam Clarise Cruz

## 2019-09-30 NOTE — Chronic Care Management (AMB) (Signed)
Chronic Care Management   Note  09/30/2019 Name: Sandra [name] MRN: [id number] DOB: [birth year]  Sandra [name] is a 74 y.o. year old female who is a primary care patient of Jerrol Banana., MD. I reached out to [name] by phone today in response to a referral sent by Sandra [name]'s health plan.     Sandra [name] was given information about Chronic Care Management services today including:  1. CCM service includes personalized support from designated clinical staff supervised by her physician, including individualized plan of care and coordination with other care providers 2. 24/7 contact phone numbers for assistance for urgent and routine care needs. 3. Service will only be billed when office clinical staff spend 20 minutes or more in a month to coordinate care. 4. Only one practitioner may furnish and bill the service in a calendar month. 5. The patient may stop CCM services at any time (effective at the end of the month) by phone call to the office staff. 6. The patient will be responsible for cost sharing (co-pay) of up to 20% of the service fee (after annual deductible is met).  Patient did not agree to enrollment in care management servicesat this time and will call back if she decides otherwise. Follow up plan: The patient has been provided with contact information for the care management team and has been advised to call with any health related questions or concerns.   Noreene Larsson, [place], [place], [place] [address] Direct Dial: [phone] [email or web address] Website: [email or web address]

## 2019-09-30 NOTE — Chronic Care Management (AMB) (Deleted)
  Chronic Care Management   Outreach Note  09/30/2019 Name: Sandra [name] MRN: [id number] DOB: May 16, 1946  Sandra [name] is a 74 y.o. year old female who is a primary care patient of Jerrol Banana., MD. I reached out to Sandra [name] by phone today in response to a referral sent by Sandra [name]'s health plan.     Spoke with Pt and she states that she will call back if she decides to participate in program. The patient was referred to the case management team for assistance with care management and care coordination.   Follow Up Plan: The care management team will reach out to the patient in the future.    Noreene Larsson, [address], [place], [place] [address] Direct Dial: [phone] [email or web address] Website: [email or web address]

## 2019-10-01 ENCOUNTER — Telehealth (HOSPITAL_COMMUNITY): Payer: Self-pay | Admitting: Emergency Medicine

## 2019-10-01 NOTE — Telephone Encounter (Signed)
error 

## 2019-10-02 ENCOUNTER — Other Ambulatory Visit: Payer: Self-pay

## 2019-10-02 ENCOUNTER — Ambulatory Visit
Admission: RE | Admit: 2019-10-02 | Discharge: 2019-10-02 | Disposition: A | Discharge status: Home / Self Care | Payer: Medicare Other | Source: Ambulatory Visit | Attending: Family | Admitting: Family

## 2019-10-02 DIAGNOSIS — R0600 Dyspnea, unspecified: Secondary | ICD-10-CM | POA: Diagnosis not present

## 2019-10-02 DIAGNOSIS — I2510 Atherosclerotic heart disease of native coronary artery without angina pectoris: Secondary | ICD-10-CM | POA: Diagnosis not present

## 2019-10-02 DIAGNOSIS — R0609 Other forms of dyspnea: Secondary | ICD-10-CM | POA: Insufficient documentation

## 2019-10-02 DIAGNOSIS — R918 Other nonspecific abnormal finding of lung field: Secondary | ICD-10-CM | POA: Insufficient documentation

## 2019-10-02 DIAGNOSIS — I5032 Chronic diastolic (congestive) heart failure: Secondary | ICD-10-CM | POA: Diagnosis not present

## 2019-10-02 HISTORY — DX: Essential (primary) hypertension: I10

## 2019-10-02 MED ORDER — NITROGLYCERIN 0.4 MG SL SUBL
0.8000 mg | SUBLINGUAL_TABLET | Freq: Once | SUBLINGUAL | Status: AC
  Administered 2019-10-02: 0.8 mg via SUBLINGUAL

## 2019-10-02 MED ORDER — IOHEXOL 350 MG/ML SOLN
75.0000 mL | Freq: Once | INTRAVENOUS | Status: AC | PRN
  Administered 2019-10-02: 75 mL via INTRAVENOUS

## 2019-10-03 ENCOUNTER — Telehealth: Payer: Self-pay | Admitting: *Deleted

## 2019-10-03 ENCOUNTER — Telehealth: Payer: Self-pay

## 2019-10-03 ENCOUNTER — Encounter: Payer: Self-pay | Admitting: Physician Assistant

## 2019-10-03 DIAGNOSIS — R918 Other nonspecific abnormal finding of lung field: Secondary | ICD-10-CM

## 2019-10-03 DIAGNOSIS — E785 Hyperlipidemia, unspecified: Secondary | ICD-10-CM

## 2019-10-03 MED ORDER — ATORVASTATIN CALCIUM 40 MG PO TABS: 40.0000 mg | ORAL_TABLET | Freq: Every day | ORAL | 3 refills | Status: DC

## 2019-10-03 NOTE — Telephone Encounter (Signed)
Recommendations and results called to patient. She verbalized understanding. She is agreeable to the CT chest without contrast.  Order entered. Patient currently not able to schedule because she is not at home and does not have her calendar. I will plan to call her back later today or next week to arrange.

## 2019-10-03 NOTE — Telephone Encounter (Signed)
Noncardiac overread: Bilateral indeterminate pulmonary nodules measuring up to 7 mm.  Given patient's history of breast cancer pulmonary metastases must be excluded.  Please schedule complete chest CT without contrast for further evaluation.  I will copy the patient's primary care physician on this note as well as an FYI and for follow-up of her CT.

## 2019-10-03 NOTE — Progress Notes (Signed)
Unable to place result note on noncardiac overread of her coronary CTA due to Epic run time error.  Please see recommendations on telephone note.

## 2019-10-03 NOTE — Telephone Encounter (Signed)
Received incoming call from [facility] Radiology reporting the Impression from Cardiac CT over-read: IMPRESSION: Bilateral indeterminate pulmonary nodules measuring up to 7 mm. Pulmonary metastases cannot be excluded in this patient with history of breast carcinoma. Complete chest CT without contrast is recommended further evaluation.  Routing to [employer], PA-C ordering provider.

## 2019-10-03 NOTE — Telephone Encounter (Signed)
-----   Message from Rise Mu, PA-C sent at 10/02/2019  5:16 PM EDT ----- Cardiac overread: Minimal nonobstructive plaque involving the proximal LAD. No further ischemic testing is needed at this time. Recommend risk factor modification including optimal lipid control. Please have her start atorvastatin 40 mg daily. Schedule fasting lipid panel and liver function to be obtained 8 weeks after starting statin. Await noncardiac overread.

## 2019-10-03 NOTE — Telephone Encounter (Signed)
Call to patient to review CT results.    Pt verbalized understanding and has no further questions at this time.    Advised pt to call for any further questions or concerns.  Orders placed as advised.

## 2019-10-06 NOTE — Telephone Encounter (Signed)
Called and got patient scheduled for CT chest w/o contrast on Friday, 10/10/19, arrival time of 2:45 pm to the [facility] off [address]. NO pre procedural instructions applicable.  Patient notified and verbalized understanding. She was appreciative. Message sent to precert.

## 2019-10-07 DIAGNOSIS — H401131 Primary open-angle glaucoma, bilateral, mild stage: Secondary | ICD-10-CM | POA: Diagnosis not present

## 2019-10-10 ENCOUNTER — Ambulatory Visit
Admission: RE | Admit: 2019-10-10 | Discharge: 2019-10-10 | Disposition: A | Discharge status: Home / Self Care | Payer: Medicare Other | Source: Ambulatory Visit | Attending: Physician Assistant | Admitting: Physician Assistant

## 2019-10-10 ENCOUNTER — Other Ambulatory Visit: Payer: Self-pay

## 2019-10-10 DIAGNOSIS — R918 Other nonspecific abnormal finding of lung field: Secondary | ICD-10-CM | POA: Diagnosis not present

## 2019-10-11 ENCOUNTER — Encounter: Payer: Self-pay | Admitting: Family Medicine

## 2019-10-13 ENCOUNTER — Other Ambulatory Visit: Payer: Self-pay | Admitting: *Deleted

## 2019-10-13 ENCOUNTER — Telehealth: Payer: Self-pay | Admitting: Internal Medicine

## 2019-10-13 DIAGNOSIS — F419 Anxiety disorder, unspecified: Secondary | ICD-10-CM

## 2019-10-13 NOTE — Telephone Encounter (Signed)
Call to patient to answer questions regarding CT. Pt saw preliminary results on mychart and wanting to get results.   I told her I only have preliminary results but advised that she could reach out to PCP for further orders since she does not currently have a pulmonologist.   Pt agreeable to [facility] and looks forward to follow up call once Christell Faith, PA has a chance to review.

## 2019-10-13 NOTE — Telephone Encounter (Signed)
Patient calling in to get results from cardiac CT

## 2019-10-14 MED ORDER — ALPRAZOLAM 0.5 MG PO TABS: 0.5000 mg | ORAL_TABLET | Freq: Every day | ORAL | 1 refills | Status: DC

## 2019-10-14 NOTE — Telephone Encounter (Signed)
Please see result note. In short, multiple pulmonary nodules of uncertain significance were imaged with recommendation for follow up imaging vs tissue sampling. I have recommended she follow up with her PCP regarding this for coordination. She was again noted to have coronary artery calcifications, which were noted to be be nonobstructive on recent coronary CTA. There was some likely chronic bronchitis and arthritic changes as well.

## 2019-10-15 ENCOUNTER — Encounter: Payer: Self-pay | Admitting: Family Medicine

## 2019-10-15 ENCOUNTER — Ambulatory Visit (INDEPENDENT_AMBULATORY_CARE_PROVIDER_SITE_OTHER): Payer: Medicare Other | Admitting: Family Medicine

## 2019-10-15 ENCOUNTER — Other Ambulatory Visit: Payer: Self-pay

## 2019-10-15 VITALS — BP 125/73 | HR 66 | Temp 96.9°F | Wt 137.2 lb

## 2019-10-15 DIAGNOSIS — C50911 Malignant neoplasm of unspecified site of right female breast: Secondary | ICD-10-CM | POA: Diagnosis not present

## 2019-10-15 DIAGNOSIS — I10 Essential (primary) hypertension: Secondary | ICD-10-CM

## 2019-10-15 DIAGNOSIS — F411 Generalized anxiety disorder: Secondary | ICD-10-CM | POA: Diagnosis not present

## 2019-10-15 DIAGNOSIS — R918 Other nonspecific abnormal finding of lung field: Secondary | ICD-10-CM | POA: Diagnosis not present

## 2019-10-15 DIAGNOSIS — M1711 Unilateral primary osteoarthritis, right knee: Secondary | ICD-10-CM

## 2019-10-15 NOTE — Progress Notes (Signed)
Established patient visit   Patient: Sandra [name]   DOB: May 31, 1945   74 y.o. Female  MRN: [id number] Visit Date: 10/15/2019  Today's healthcare provider: [name], MD   Chief Complaint  Patient presents with  . Lung nodules   Mertie Moores as a scribe for [name], MD.,have documented all relevant documentation on the behalf of [name], MD,as directed by  [name], MD while in the presence of [name], MD.  Subjective    HPI Lung Nodules:  Patient presents today for a consult regarding 7 mm lung nodules. Patient had a extensive evaluation due to right leg and hip pain. She had a Echo on 4/16, Labs on 5/3, Cardiac CT on 5/6, and chest CT on 5/14. CT showed multiple pulmonary nodules throughout both lungs, largest measuring up to 7 mm in the right middle lobe. Feels well.  She is worried about this being metastatic breast cancer.  Never smoked but her husband has smoked their entire marriage.  In March she was treated for right knee osteoarthritis and right hip bursitis.      Medications: Outpatient Medications Prior to Visit  Medication Sig  . ALPRAZolam (XANAX) 0.5 MG tablet Take 1 tablet (0.5 mg total) by mouth at bedtime.  [name] amLODipine (NORVASC) 5 MG tablet TAKE 1 TABLET BY MOUTH  DAILY  . atorvastatin (LIPITOR) 40 MG tablet Take 1 tablet (40 mg total) by mouth daily.  . Biotin 1000 MCG tablet Take 1,000 mcg by mouth daily.  . Calcium Carbonate (CALCIUM 600 PO) Take 600 mg by mouth daily at 3 pm.   . carvedilol (COREG) 3.125 MG tablet TAKE 1 TABLET BY MOUTH  TWICE DAILY  . Cholecalciferol (VITAMIN D3) 1000 units CAPS Take 1,000 Units by mouth.  . dicyclomine (BENTYL) 10 MG capsule TAKE 1 CAPSULE BY MOUTH 3  TIMES DAILY BEFORE MEALS  . hydroxypropyl methylcellulose / hypromellose (ISOPTO TEARS / GONIOVISC) 2.5 % ophthalmic solution Place 1 drop into both eyes 4 (four) times daily as needed for dry eyes  (scheduled each morning & then as needed).  . latanoprost (XALATAN) 0.005 % ophthalmic solution INT 1 GTT IN OD 1 TIME IN THE EVE  . letrozole (FEMARA) 2.5 MG tablet Take 1 tablet (2.5 mg total) by mouth daily.  [name] levothyroxine (SYNTHROID) 100 MCG tablet TAKE 1 TABLET BY MOUTH 30  MINUTES BEFORE BREAKFAST  . Multiple Vitamin (MULTIVITAMIN WITH MINERALS) TABS tablet Take 1 tablet by mouth daily at 3 pm.  . Omega-3 Fatty Acids (FISH OIL PO) Take 1 capsule by mouth daily.   [name] omeprazole (PRILOSEC) 40 MG capsule TAKE 1 CAPSULE BY MOUTH  DAILY  . sennosides-docusate sodium (SENOKOT-S) 8.6-50 MG tablet Take 1 tablet by mouth as needed for constipation.  . sertraline (ZOLOFT) 100 MG tablet TAKE 1 TABLET BY MOUTH  DAILY  . Simethicone (GAS-X PO) Take 1 tablet by mouth as needed.  . [DISCONTINUED] metoprolol tartrate (LOPRESSOR) 100 MG tablet Take 1 tablet (100 mg total) by mouth once for 1 dose.   No facility-administered medications prior to visit.    Review of Systems  Constitutional: Negative.   Eyes: Negative.   Respiratory: Negative.   Cardiovascular: Negative.   Gastrointestinal: Negative.   Endocrine: Negative.   Musculoskeletal: Negative.   Allergic/Immunologic: Negative.   Hematological: Negative.   Psychiatric/Behavioral: Negative.        Objective    BP 125/73 (BP Location: Left Arm, Patient Position:  Sitting, Cuff Size: Normal)   Pulse 66   Temp (!) 96.9 F (36.1 C) (Temporal)   Wt 137 lb 3.2 oz (62.2 kg)   BMI 24.30 kg/m  Wt Readings from Last 3 Encounters:  10/15/19 137 lb 3.2 oz (62.2 kg)  09/02/19 138 lb 6 oz (62.8 kg)  08/18/19 137 lb (62.1 kg)      Physical Exam Vitals reviewed.  Constitutional:      Appearance: Normal appearance. She is normal weight.  HENT:     Head: Normocephalic.     Right Ear: External ear normal.     Left Ear: External ear normal.     Nose: Nose normal.     Mouth/Throat:     Pharynx: Oropharynx is clear.  Eyes:     General: No  scleral icterus.    Conjunctiva/sclera: Conjunctivae normal.  Cardiovascular:     Rate and Rhythm: Normal rate and regular rhythm.     Pulses: Normal pulses.     Heart sounds: Normal heart sounds.  Pulmonary:     Breath sounds: Normal breath sounds.  Abdominal:     Palpations: Abdomen is soft.  Skin:    General: Skin is warm and dry.  Neurological:     General: No focal deficit present.     Mental Status: She is alert and oriented to person, place, and time. Mental status is at baseline.  Psychiatric:        Mood and Affect: Mood normal.        Behavior: Behavior normal.        Thought Content: Thought content normal.        Judgment: Judgment normal.      General: Appearance:    Well developed, well nourished female in no acute distress  Eyes:    PERRL, conjunctiva/corneas clear, EOM's intact       Lungs:     Clear to auscultation bilaterally, respirations unlabored  Heart:    Normal heart rate. Normal rhythm. No murmurs, rubs, or gallops.   MS:   All extremities are intact.   Neurologic:   Awake, alert, oriented x 3. No apparent focal neurological           defect.       No results found for any visits on 10/15/19.  Assessment & Plan     1. Multiple lung nodules on CT A long talk with Dr. Bary Castilla her breast cancer.  He thinks that it is very low risk this could be metastatic breast cancer. Patient's daughter is a Marine scientist and evidently wants patient to see pulmonology.  I am fine with this. She does have some COPD so we will start her on Spiriva daily.  I will see her back in a month. More than 50% 30 minute visit spent in counseling or coordination of care  2. GAD (generalized anxiety disorder) Clinically stable.  3.  ductal carcinoma of breast, female, right ([place]) Followed by Dr. Bary Castilla.  4. Essential hypertension Good control.  5. Primary osteoarthritis of right knee    No follow-ups on file.      I, [name], MD, have reviewed all documentation  for this visit. The documentation on 10/18/19 for the exam, diagnosis, procedures, and orders are all accurate and complete.    Richard Cranford Mon, MD  [facility] [phone] (phone) [phone] (fax)  [place]

## 2019-10-15 NOTE — Telephone Encounter (Signed)
Response sent to patient in mychart.   No further orders at this time.   Follow up as planned.

## 2019-10-16 ENCOUNTER — Telehealth: Payer: Self-pay

## 2019-10-16 DIAGNOSIS — R918 Other nonspecific abnormal finding of lung field: Secondary | ICD-10-CM

## 2019-10-16 NOTE — Telephone Encounter (Signed)
Patient's daughter advised that referral has been placed.

## 2019-10-21 ENCOUNTER — Encounter: Payer: Self-pay | Admitting: Family Medicine

## 2019-10-21 ENCOUNTER — Telehealth: Payer: Self-pay | Admitting: Internal Medicine

## 2019-10-21 NOTE — Telephone Encounter (Signed)
Spoke with the pt's daughter  Pt has consult with Dr Chase Caller 10/24/19 She states that she wants [place] to be make aware that the pt has cough, SOB and low energy  She had recent ct that showed some nodules  She is concerned that the pt will not convey her symptoms at the visit  Will forward to MR to make him aware

## 2019-10-22 NOTE — Telephone Encounter (Signed)
Thank you for documenting this.  Now that it is documented I will make note of it during the visit.  Am closing the encounter

## 2019-10-24 ENCOUNTER — Other Ambulatory Visit
Admission: RE | Admit: 2019-10-24 | Discharge: 2019-10-24 | Disposition: A | Discharge status: Home / Self Care | Payer: Medicare Other | Source: Ambulatory Visit | Attending: Internal Medicine | Admitting: Internal Medicine

## 2019-10-24 ENCOUNTER — Other Ambulatory Visit: Payer: Self-pay

## 2019-10-24 ENCOUNTER — Encounter: Payer: Self-pay | Admitting: Internal Medicine

## 2019-10-24 ENCOUNTER — Other Ambulatory Visit: Payer: Self-pay | Admitting: Internal Medicine

## 2019-10-24 ENCOUNTER — Ambulatory Visit: Payer: Medicare Other | Admitting: Internal Medicine

## 2019-10-24 VITALS — BP 120/60 | HR 56 | Temp 97.3°F | Ht 63.0 in | Wt 134.0 lb

## 2019-10-24 DIAGNOSIS — R0609 Other forms of dyspnea: Secondary | ICD-10-CM

## 2019-10-24 DIAGNOSIS — R053 Chronic cough: Secondary | ICD-10-CM

## 2019-10-24 DIAGNOSIS — R918 Other nonspecific abnormal finding of lung field: Secondary | ICD-10-CM | POA: Insufficient documentation

## 2019-10-24 DIAGNOSIS — R0602 Shortness of breath: Secondary | ICD-10-CM

## 2019-10-24 DIAGNOSIS — R05 Cough: Secondary | ICD-10-CM | POA: Diagnosis not present

## 2019-10-24 DIAGNOSIS — Z889 Allergy status to unspecified drugs, medicaments and biological substances status: Secondary | ICD-10-CM | POA: Insufficient documentation

## 2019-10-24 DIAGNOSIS — R0600 Dyspnea, unspecified: Secondary | ICD-10-CM | POA: Diagnosis not present

## 2019-10-24 LAB — CBC WITH DIFFERENTIAL/PLATELET
Abs Immature Granulocytes: 0.02 10*3/uL (ref 0.00–0.07)
Basophils Absolute: 0 10*3/uL (ref 0.0–0.1)
Basophils Relative: 0 %
Eosinophils Absolute: 0 10*3/uL (ref 0.0–0.5)
Eosinophils Relative: 1 %
HCT: 34.5 % — ABNORMAL LOW (ref 36.0–46.0)
Hemoglobin: 11.5 g/dL — ABNORMAL LOW (ref 12.0–15.0)
Immature Granulocytes: 0 %
Lymphocytes Relative: 25 %
Lymphs Abs: 1.5 10*3/uL (ref 0.7–4.0)
MCH: 29 pg (ref 26.0–34.0)
MCHC: 33.3 g/dL (ref 30.0–36.0)
MCV: 87.1 fL (ref 80.0–100.0)
Monocytes Absolute: 0.6 10*3/uL (ref 0.1–1.0)
Monocytes Relative: 10 %
Neutro Abs: 3.8 10*3/uL (ref 1.7–7.7)
Neutrophils Relative %: 64 %
Platelets: 265 10*3/uL (ref 150–400)
RBC: 3.96 MIL/uL (ref 3.87–5.11)
RDW: 12.4 % (ref 11.5–15.5)
WBC: 6 10*3/uL (ref 4.0–10.5)
nRBC: 0 % (ref 0.0–0.2)

## 2019-10-24 NOTE — Progress Notes (Signed)
    OV 10/24/2019  Subjective:  Patient ID: Sandra [name], female , DOB: 03/07/1946 , age 73 y.o. , MRN: [id number] , ADDRESS: [address] [place] [address]   10/24/2019 -   Chief Complaint  Patient presents with  . Pulmonary Consult    Referred by Dr Gilbert for eval of pulmonary nodules. Pt c/o SOB with exertion for the past year.      HPI Sandra [name] 73 y.o. 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  Julie Emerson suggested patient see myself Dr. .  According to the patient she has had insidious onset of shortness of breath that is progressive in the last 3 to 4 years ever since she turned 74 years old.  Then 3 years ago she had breast cancer for which it was early and she had status post mastectomy with complete remission.  She is not able to do gardening and other things she used to do.  In fact when we walked her she did not desaturate 185 feet x 3 laps but did get mildly dyspneic.  She senses onset of chronic cough for the last 3 months.  There is also progressive.  She does not know why.  Then in March 2020 when she had unexplained right lower extremity edema involving the thigh knee and ankle.  She showed me pictures of this.  Apparently Doppler lower extremities ruled out DVT.  However she had a CT scan of the chest because of persistent shortness of breath.  Initially it was a coronary artery CT that then resulted in regular CT scan of the chest that I personally visualized.  This does not show emphysema pulmonary fibrosis but shows multiple lung nodules.  There is concern this could be due to prior breast cancer.  Therefore she has been referred here..  There is no prior history of tuberculosis.  There is no ACE inhibitor use.  She does have mild acid reflux.  She is on carvedilol nonspecific beta-blocker.  There is no prior history of asthma but she does have spring allergies.  Work-up below as shown moderate mitral valve regurgitation  with left ventricular hypertrophy and grade 2 diastolic dysfunction.  Also multiple lung nodules with some air trapping.  No emphysema fibrosis.  No pulmonary function test available.  Hemoglobin and creatinine are fine.  Simple office walk 185 feet x  3 laps goal with forehead probe 10/24/2019   O2 used ra  Number laps completed 3  Comments about pace avg  Resting Pulse Ox/HR 99% and 64/min  Final Pulse Ox/HR 97% and 79/min  Desaturated </= 88% no  Desaturated <= 3% points no  Got Tachycardic >/= 90/min no  Symptoms at end of test Mild dyspnea  Miscellaneous comments x   Results for Sandra [name] (MRN [id number]) as of 10/24/2019 10:14  Ref. Range 06/30/2019 11:45 09/29/2019 09:36  Creatinine Latest Ref Range: 0.44 - 1.00 mg/dL 0.69 0.76   Results for Sandra [name] (MRN [id number]) as of 10/24/2019 10:14  Ref. Range 06/30/2019 11:45 09/29/2019 09:36  Hemoglobin Latest Ref Range: 11.1 - 15.9 g/dL 12.3     EXAM: - CT CHEST WITHOUT CONTRAST - personally visualized  TECHNIQUE: Multidetector CT imaging of the chest was performed following the standard protocol without IV contrast.  COMPARISON:  Coronary CT 10/02/2019  FINDINGS: Cardiovascular: Normal heart size. No pericardial effusion. Coronary artery calcifications are again noted better detailed on recent coronary CT. Atherosclerotic   plaque within the normal caliber aorta. The left vertebral artery arises directly from the aortic arch. Minimal plaque in the proximal great vessels. Central pulmonary arteries are normal caliber. Luminal evaluation precluded in the absence of contrast media.  Mediastinum/Nodes: No mediastinal fluid or gas. Normal thyroid gland and thoracic inlet. No acute abnormality of the trachea or esophagus. No worrisome mediastinal or axillary adenopathy. Hilar nodal evaluation is limited in the absence of intravenous contrast media.  Lungs/Pleura: Redemonstration of multiple pulmonary nodules throughout  both lungs. These include:  *7 mm nodule, right middle lobe (3/82) *3 mm nodule, medial basal segment right lower lobe (3/102) *3 mm subpleural nodule lateral basal segment left lower lobe (3/105) *3 mm nodule apicoposterior segment left upper lobe (3/53)  Few additional centrilobular and tree-in-bud nodularity is seen in the left apex (3/33) and in the periphery of right apex (3/35, 39). Some mild mosaic attenuation towards the lung bases may reflect areas of air trapping. More mild diffuse central airways thickening. No consolidation, features of edema, pneumothorax, or effusion.  Upper Abdomen: No acute abnormalities present in the visualized portions of the upper abdomen.  Musculoskeletal: Multilevel degenerative changes are present in the imaged portions of the spine. No acute osseous abnormality or suspicious osseous lesion. Postsurgical changes from prior right mastectomy.  IMPRESSION: 1. Redemonstration of multiple pulmonary nodules throughout both lungs, largest again measuring up to 7 mm in the right middle lobe. Given patient's history of breast cancer, metastatic disease is not excluded. Consider short-term interval follow-up imaging versus tissue sampling. Fleischner Society criteria explicitly do not apply in patients with known primary malignancy. 2. Some mild mosaic attenuation towards the lung bases may reflect areas of air trapping/small airways disease with mild chronic bronchitic features. 3. Coronary artery disease, better detailed on recent coronary CT. 4. Aortic Atherosclerosis (ICD10-I70.0).   Electronically Signed   By: Price  DeHay M.D.   On: 10/11/2019 18:05  ECHO 09/22/19   IMPRESSIONS    1. Left ventricular ejection fraction, by estimation, is 60 to 65%. The  left ventricle has normal function. The left ventricle has no regional  wall motion abnormalities. There is mild left ventricular hypertrophy.  Left ventricular diastolic  parameters  are consistent with Grade II diastolic dysfunction (pseudonormalization).  2. Right ventricular systolic function is normal. The right ventricular  size is normal. There is normal pulmonary artery systolic pressure.  3. Left atrial size was mildly dilated.  4. Mild to moderate mitral valve regurgitation  ROS - per HPI     has a past medical history of Abnormality of left ventricle of heart, Anemia, Anxiety, Arthritis, BRCA gene mutation negative (10/2016), Breast cancer (HCC) (10/16/2016), Celiac disease, Depression, Diastolic dysfunction, Dyspnea, GERD (gastroesophageal reflux disease), Glaucoma, Headache, Heart murmur, Hypertension, Hypothyroidism, Pneumonia, Raynaud's disease, and RLS (restless legs syndrome).   reports that she has never smoked. She has never used smokeless tobacco.  Past Surgical History:  Procedure Laterality Date  . ABDOMINAL HYSTERECTOMY    . APPENDECTOMY    . AXILLARY LYMPH NODE BIOPSY Right 03/05/2017   Procedure: AXILLARY LYMPH NODE BIOPSY;  Surgeon: Byrnett, Jeffrey W, MD;  Location: [facility] ORS;  Service: General;  Laterality: Right;  . CATARACT EXTRACTION Bilateral   . CESAREAN SECTION    . COLONOSCOPY WITH PROPOFOL N/A 05/19/2015   Procedure: COLONOSCOPY WITH PROPOFOL;  Surgeon: Robert T Elliott, MD;  Location: [facility];  Service: Endoscopy;  Laterality: N/A;  . COLONOSCOPY WITH PROPOFOL N/A 08/01/2019   Procedure: COLONOSCOPY WITH PROPOFOL;    Surgeon: Wohl, Darren, MD;  Location: [facility] END[facility];  Service: Endoscopy;  Laterality: N/A;  . EYE SURGERY    . HAND SURGERY Left   . MASTECTOMY Right 11/07/2016   RESIDUAL INVASIVE MAMMARY CARCINOMA, SUBAREOLAR ANTERIOR TO PREVIOUS   . SENTINEL NODE BIOPSY Right 11/07/2016   Procedure: SENTINEL NODE BIOPSY;  Surgeon: Byrnett, Jeffrey W, MD;  Location: [facility] OR[facility];  Service: General;  Laterality: Right;  . SIMPLE MASTECTOMY WITH AXILLARY SENTINEL NODE BIOPSY Right 11/07/2016   6 mm ER/PR 100%; Her 2  neu not overexpressed, T1b, N0.  Surgeon: Byrnett, Jeffrey W, MD;  Location: [facility] OR[facility];  Service: General;  Laterality: Right;  . TONSILLECTOMY  AGE 9  . UPPER GI ENDOSCOPY  01/12/06   hiatus hernia    No Known Allergies  Immunization History  Administered Date(s) Administered  . Influenza, High Dose Seasonal PF 03/04/2015, 04/07/2016, 03/16/2017, 02/09/2018, 02/19/2019  . PFIZER SARS-COV-2 Vaccination 06/18/2019  . Pneumococcal Conjugate-13 02/05/2014  . Pneumococcal Polysaccharide-23 04/07/2016  . Tdap 03/18/2008    Family History  Problem Relation Age of Onset  . Cancer Mother   . Anemia Mother   . Other Mother        lymphosarcoma  . Alcohol abuse Father   . Prostate cancer Father   . Breast cancer Sister        75; 1/2 sister, shared mother.  . Cancer Sister        bile duct  . Stroke Maternal Grandmother   . Breast cancer Maternal Grandmother 68  . Heart attack Maternal Grandfather   . CAD Paternal Grandfather      Current Outpatient Medications:  .  ALPRAZolam (XANAX) 0.5 MG tablet, Take 1 tablet (0.5 mg total) by mouth at bedtime., Disp: 90 tablet, Rfl: 1 .  amLODipine (NORVASC) 5 MG tablet, TAKE 1 TABLET BY MOUTH  DAILY, Disp: 90 tablet, Rfl: 3 .  atorvastatin (LIPITOR) 40 MG tablet, Take 1 tablet (40 mg total) by mouth daily., Disp: 90 tablet, Rfl: 3 .  Biotin 1000 MCG tablet, Take 1,000 mcg by mouth daily., Disp: , Rfl:  .  Calcium Carbonate (CALCIUM 600 PO), Take 600 mg by mouth daily at 3 pm. , Disp: , Rfl:  .  carvedilol (COREG) 3.125 MG tablet, TAKE 1 TABLET BY MOUTH  TWICE DAILY, Disp: 180 tablet, Rfl: 3 .  Cholecalciferol (VITAMIN D3) 1000 units CAPS, Take 1,000 Units by mouth., Disp: , Rfl:  .  dicyclomine (BENTYL) 10 MG capsule, TAKE 1 CAPSULE BY MOUTH 3  TIMES DAILY BEFORE MEALS, Disp: 270 capsule, Rfl: 0 .  hydroxypropyl methylcellulose / hypromellose (ISOPTO TEARS / GONIOVISC) 2.5 % ophthalmic solution, Place 1 drop into both eyes 4 (four) times daily  as needed for dry eyes (scheduled each morning & then as needed)., Disp: , Rfl:  .  latanoprost (XALATAN) 0.005 % ophthalmic solution, INT 1 GTT IN OD 1 TIME IN THE EVE, Disp: , Rfl: 5 .  letrozole (FEMARA) 2.5 MG tablet, Take 1 tablet (2.5 mg total) by mouth daily., Disp: 90 tablet, Rfl: 3 .  levothyroxine (SYNTHROID) 100 MCG tablet, TAKE 1 TABLET BY MOUTH 30  MINUTES BEFORE BREAKFAST, Disp: 90 tablet, Rfl: 3 .  Multiple Vitamin (MULTIVITAMIN WITH MINERALS) TABS tablet, Take 1 tablet by mouth daily at 3 pm., Disp: , Rfl:  .  Omega-3 Fatty Acids (FISH OIL PO), Take 1 capsule by mouth daily. , Disp: , Rfl:  .  omeprazole (PRILOSEC) 40 MG capsule, TAKE 1 CAPSULE BY   MOUTH  DAILY, Disp: 90 capsule, Rfl: 3 .  sennosides-docusate sodium (SENOKOT-S) 8.6-50 MG tablet, Take 1 tablet by mouth as needed for constipation., Disp: , Rfl:  .  sertraline (ZOLOFT) 100 MG tablet, TAKE 1 TABLET BY MOUTH  DAILY, Disp: 90 tablet, Rfl: 1 .  Simethicone (GAS-X PO), Take 1 tablet by mouth as needed., Disp: , Rfl:       Objective:   Vitals:   10/24/19 0958  BP: 120/60  Pulse: (!) 56  Temp: (!) 97.3 F (36.3 C)  TempSrc: Temporal  SpO2: 98%  Weight: 134 lb (60.8 kg)  Height: 5' 3" (1.6 m)    Estimated body mass index is 23.74 kg/m as calculated from the following:   Height as of this encounter: 5' 3" (1.6 m).   Weight as of this encounter: 134 lb (60.8 kg).  @WEIGHTCHANGE@  Filed Weights   10/24/19 0958  Weight: 134 lb (60.8 kg)     Physical Exam Pleasant thin female with a normal oral cavity no neck nodes.  Absence of right breast as can be visualized with her clothes on.  Clear to auscultation bilaterally.  No wheezes.  She does have a systolic murmur felt throughout.  [She does have mitral valve regurgitation].  No cyanosis no clubbing no edema.  Previous edema has resolved.         Assessment:       ICD-10-CM   1. Dyspnea on exertion  R06.00 QuantiFERON-TB Gold Plus    CBC w/Diff    Resp  Allergy Profile Regn2DC [place] [facility] [place] [facility]    IgE    Pulmonary Function Test [facility] Only  2. Chronic cough  R05 QuantiFERON-TB Gold Plus    CBC w/Diff    Resp Allergy Profile Regn2DC DE MD [place] [facility]    IgE    Pulmonary Function Test [facility] Only  3. Pulmonary nodules/lesions, multiple  R91.8 QuantiFERON-TB Gold Plus    CBC w/Diff    Resp Allergy Profile Regn2DC DE MD [place] [facility]    IgE    Pulmonary Function Test [facility] Only  4. History of seasonal allergies  Z88.9 QuantiFERON-TB Gold Plus    CBC w/Diff    Resp Allergy Profile Regn2DC DE MD [place] [facility]    IgE    Pulmonary Function Test [facility] Only       Plan:     Patient Instructions  Dyspnea on exertion Chronic cough Pulmonary nodules/lesions, multiple History Seasonal Allergy  -Shortness of breath could be due to asthma undiagnosed and/or stiff heart muscle with leaky mitral valve -Cough: This could be asthma or could be related to the nodules  -Multiple lung nodules: This could be related to cough and shortness of breath in the form of a slow infection call [facility].  I do not think the nodules are related to breast cancer  Plan -Do blood work CBC with differential, IgE and blood allergy panel today - Do Quantiferon Gold TB blood -Do full pulmonary function test in the new next few to several weeks -Do exhaled nitric oxide test in the next few to several weeks -Do high-resolution CT scan of the chest in mid August 2021 -as a follow-up -Discussed the stiff heart muscle and leaky mitral valve with your cardiologist Dr. End  Follow-up -Mid August 2021 to discuss follow-up scan [we will call you earlier depending on the breathing test and blood test results]     SIGNATURE    Dr.  , M.D., F.C.C.P,  Pulmonary and Critical   Care Medicine Staff Physician, [place] Director - Interstitial Lung Disease  Program  Pulmonary [place] [facility] [place], [place], [address]  Pager: [phone], If no answer or between  15:00h - 7:00h: call [phone] Telephone: [phone]  10:50 AM 10/24/2019

## 2019-10-24 NOTE — Patient Instructions (Addendum)
Dyspnea on exertion Chronic cough Pulmonary nodules/lesions, multiple History Seasonal Allergy  -Shortness of breath could be due to asthma undiagnosed and/or stiff heart muscle with leaky mitral valve -Cough: This could be asthma or could be related to the nodules  -Multiple lung nodules: This could be related to cough and shortness of breath in the form of a slow infection call [facility].  I do not think the nodules are related to breast cancer  Plan -Do blood work CBC with differential, IgE and blood allergy panel today - Do Quantiferon Gold TB blood -Do full pulmonary function test in the new next few to several weeks -Do exhaled nitric oxide test in the next few to several weeks -Do high-resolution CT scan of the chest in mid August 2021 -as a follow-up -Discussed the stiff heart muscle and leaky mitral valve with your cardiologist Dr. [name]  Follow-up -Mid August 2021 to discuss follow-up scan [we will call you earlier depending on the breathing test and blood test results]

## 2019-10-26 LAB — QUANTIFERON-TB GOLD PLUS: QuantiFERON-TB Gold Plus: NEGATIVE

## 2019-10-26 LAB — QUANTIFERON-TB GOLD PLUS (RQFGPL)
QuantiFERON Mitogen Value: >10 IU/mL
QuantiFERON Nil Value: 0 IU/mL
QuantiFERON TB1 Ag Value: 0 IU/mL
QuantiFERON TB2 Ag Value: 0 IU/mL

## 2019-10-29 ENCOUNTER — Other Ambulatory Visit: Payer: Self-pay

## 2019-10-29 ENCOUNTER — Encounter: Payer: Self-pay | Admitting: Internal Medicine

## 2019-10-29 ENCOUNTER — Ambulatory Visit: Payer: Medicare Other | Admitting: Internal Medicine

## 2019-10-29 VITALS — BP 134/70 | HR 59 | Ht 63.0 in | Wt 134.0 lb

## 2019-10-29 DIAGNOSIS — I10 Essential (primary) hypertension: Secondary | ICD-10-CM

## 2019-10-29 LAB — ALLERGENS W/TOTAL IGE AREA 2
Alternaria Alternata IgE: <0.1 kU/L
Aspergillus Fumigatus IgE: <0.1 kU/L
Bermuda Grass IgE: <0.1 kU/L
Cat Dander IgE: <0.1 kU/L
Cedar, Mountain IgE: <0.1 kU/L
Cladosporium Herbarum IgE: <0.1 kU/L
Cockroach, German IgE: <0.1 kU/L
Common Silver Birch IgE: <0.1 kU/L
Cottonwood IgE: <0.1 kU/L
D Farinae IgE: <0.1 kU/L
D Pteronyssinus IgE: <0.1 kU/L
Dog Dander IgE: <0.1 kU/L
Elm, American IgE: 0.1 kU/L — AB
IgE (Immunoglobulin E), Serum: 14 IU/mL (ref 6–495)
Johnson Grass IgE: <0.1 kU/L
Maple/Box Elder IgE: <0.1 kU/L
Mouse Urine IgE: <0.1 kU/L
Oak, White IgE: <0.1 kU/L
Pecan, Hickory IgE: <0.1 kU/L
Penicillium Chrysogen IgE: <0.1 kU/L
Pigweed, Rough IgE: <0.1 kU/L
Ragweed, Short IgE: <0.1 kU/L
Sheep Sorrel IgE Qn: <0.1 kU/L
Timothy Grass IgE: 0.16 kU/L — AB
White Mulberry IgE: <0.1 kU/L

## 2019-10-29 MED ORDER — FUROSEMIDE 20 MG PO TABS
20.0000 mg | ORAL_TABLET | Freq: Every day | ORAL | 3 refills | Status: DC
Start: 2019-10-29 — End: 2019-12-03

## 2019-10-29 NOTE — Patient Instructions (Signed)
Medication Instructions:  Your physician has recommended you make the following change in your medication:  1- START Furosemide 20 mg (1 tablet) by mouth once a day.  *If you need a refill on your cardiac medications before your next appointment, please call your pharmacy*  Follow-Up: At [facility], you and your health needs are our priority.  As part of our continuing mission to provide you with exceptional heart care, we have created designated Provider Care Teams.  These Care Teams include your primary Cardiologist (physician) and Advanced Practice Providers (APPs -  Physician Assistants and Nurse Practitioners) who all work together to provide you with the care you need, when you need it.  We recommend signing up for the patient portal called "MyChart".  Sign up information is provided on this After Visit Summary.  MyChart is used to connect with patients for Virtual Visits (Telemedicine).  Patients are able to view lab/test results, encounter notes, upcoming appointments, etc.  Non-urgent messages can be sent to your provider as well.   To learn more about what you can do with MyChart, go to [email or web address].    Your next appointment:   1 month(s)  The format for your next appointment:   In Person  Provider:    You may see Nelva Bush, MD or Christell Faith, PA-C.

## 2019-10-29 NOTE — Progress Notes (Signed)
Follow-up Outpatient Visit Date: 10/29/2019  Primary Care Provider: Jerrol Banana., MD [address] [place] [address]  Chief Complaint: Shortness of breath and leg swelling  HPI:  Sandra [name] is a 74 y.o. female with history of chronic HFpEF, breast cancer, celiac disease, hypothyroidism, Raynaud's disease, restless leg syndrome, GERD, and anxiety, who presents for follow-up of heart failure.  She was seen in our office in early April for evaluation of shortness of breath, swelling, and weight gain.  Subsequent echo showed normal LVEF with grade 2 diastolic dysfunction, mild left atrial enlargement, and mild to moderate mitral regurgitation.  Cardiac CTA showed mild plaquing in the proximal LAD with a coronary calcium score of 9.  Subcentimeter pulmonary nodules were noted as well.  Today, Sandra [name] reports she is doing relatively well.  She is not had any further swelling of the right leg but still have some shortness of breath and cough.  She was recently started on Spiriva with some improvement.  She has not had any chest pain or palpitations.  She notes occasional orthostatic lightheadedness.  She feels like her abdomen is somewhat distended.  --------------------------------------------------------------------------------------------------  Past Medical History:  Diagnosis Date  . Abnormality of left ventricle of heart    MILD-MODERATE THICKENING PER ECHO ON 01-18-17  . Anemia   . Anxiety   . Arthritis   . BRCA gene mutation negative 10/2016   NEGATIVE: [employer]  . Breast cancer ([place]) 10/16/2016   T1c,N0;, GRADE I/III, 1.6cm. ER/PR pos  HER2 not over expressed, Right Upper Outer  . Celiac disease   . Depression   . Diastolic dysfunction   . Dyspnea    WITH EXERTION  . GERD (gastroesophageal reflux disease)    OCC  . Glaucoma    right eye  . Headache    H/O MIGRAINES AS TEENAGER  . Heart murmur    PT WAS TOLD ONCE SHE HAD MVP AND THEN ANOTHER MD  SAID SHE DID NOT HAVE MVP  . Hypertension   . Hypothyroidism   . Pneumonia    YEARS AGO  . Raynaud's disease   . RLS (restless legs syndrome)    Past Surgical History:  Procedure Laterality Date  . ABDOMINAL HYSTERECTOMY    . APPENDECTOMY    . AXILLARY LYMPH NODE BIOPSY Right 03/05/2017   Procedure: AXILLARY LYMPH NODE BIOPSY;  Surgeon: Robert Bellow, MD;  Location: [facility] ORS;  Service: General;  Laterality: Right;  . CATARACT EXTRACTION Bilateral   . CESAREAN SECTION    . COLONOSCOPY WITH PROPOFOL N/A 05/19/2015   Procedure: COLONOSCOPY WITH PROPOFOL;  Surgeon: Manya Silvas, MD;  Location[facility] END[facility]COPY;  Service: Endoscopy;  Laterality: N/A;  . COLONOSCOPY WITH PROPOFOL N/A 08/01/2019   Procedure: COLONOSCOPY WITH PROPOFOL;  Surgeon: Lucilla Lame, MD;  Location: [facility] ENDOSCOPY;  Service: Endoscopy;  Laterality: N/A;  . EYE SURGERY    . HAND SURGERY Left   . MASTECTOMY Right 11/07/2016   RESIDUAL INVASIVE MAMMARY CARCINOMA, SUBAREOLAR ANTERIOR TO PREVIOUS   . SENTINEL NODE BIOPSY Right 11/07/2016   Procedure: SENTINEL NODE BIOPSY;  Surgeon: Robert Bellow, MD;  Location: [facility] ORS;  Service: General;  Laterality: Right;  . SIMPLE MASTECTOMY WITH AXILLARY SENTINEL NODE BIOPSY Right 11/07/2016   6 mm ER/PR 100%; Her 2 neu not overexpressed, T1b, N0.  Surgeon: Robert Bellow, MD;  Location: [facility] ORS;  Service: General;  Laterality: Right;  . TONSILLECTOMY  AGE 46  . UPPER GI  ENDOSCOPY  01/12/06   hiatus hernia    Current Meds  Medication Sig  . ALPRAZolam (XANAX) 0.5 MG tablet Take 1 tablet (0.5 mg total) by mouth at bedtime.  [name] amLODipine (NORVASC) 5 MG tablet TAKE 1 TABLET BY MOUTH  DAILY  . atorvastatin (LIPITOR) 40 MG tablet Take 1 tablet (40 mg total) by mouth daily.  . Biotin 1000 MCG tablet Take 1,000 mcg by mouth daily.  . Calcium Carbonate (CALCIUM 600 PO) Take 600 mg by mouth daily at 3 pm.   . carvedilol (COREG) 3.125 MG tablet TAKE 1 TABLET BY MOUTH  TWICE  DAILY  . Cholecalciferol (VITAMIN D3) 1000 units CAPS Take 1,000 Units by mouth.  . dicyclomine (BENTYL) 10 MG capsule TAKE 1 CAPSULE BY MOUTH 3  TIMES DAILY BEFORE MEALS  . hydroxypropyl methylcellulose / hypromellose (ISOPTO TEARS / GONIOVISC) 2.5 % ophthalmic solution Place 1 drop into both eyes 4 (four) times daily as needed for dry eyes (scheduled each morning & then as needed).  . latanoprost (XALATAN) 0.005 % ophthalmic solution INT 1 GTT IN OD 1 TIME IN THE EVE  . letrozole (FEMARA) 2.5 MG tablet Take 1 tablet (2.5 mg total) by mouth daily.  [name] levothyroxine (SYNTHROID) 100 MCG tablet TAKE 1 TABLET BY MOUTH 30  MINUTES BEFORE BREAKFAST  . Multiple Vitamin (MULTIVITAMIN WITH MINERALS) TABS tablet Take 1 tablet by mouth daily at 3 pm.  . Omega-3 Fatty Acids (FISH OIL PO) Take 1 capsule by mouth daily.   [name] omeprazole (PRILOSEC) 40 MG capsule TAKE 1 CAPSULE BY MOUTH  DAILY  . sennosides-docusate sodium (SENOKOT-S) 8.6-50 MG tablet Take 1 tablet by mouth as needed for constipation.  . sertraline (ZOLOFT) 100 MG tablet TAKE 1 TABLET BY MOUTH  DAILY  . Simethicone (GAS-X PO) Take 1 tablet by mouth as needed.  . Tiotropium Bromide Monohydrate (SPIRIVA RESPIMAT) 1.25 MCG/ACT AERS Inhale into the lungs.    Allergies: Patient has no known allergies.  Social History   Tobacco Use  . Smoking status: Never Smoker  . Smokeless tobacco: Never Used  Substance Use Topics  . Alcohol use: Yes    Comment: 0-2 glasses of wine a month  . Drug use: No    Family History  Problem Relation Age of Onset  . Cancer Mother   . Anemia Mother   . Other Mother        lymphosarcoma  . Alcohol abuse Father   . Prostate cancer Father   . Breast cancer Sister        52; 1/2 sister, shared mother.  . Cancer Sister        bile duct  . Stroke Maternal Grandmother   . Breast cancer Maternal Grandmother 68  . Heart attack Maternal Grandfather   . CAD Paternal Grandfather     Review of Systems: A  12-system review of systems was performed and was negative except as noted in the HPI.  --------------------------------------------------------------------------------------------------  Physical Exam: BP 134/70 (BP Location: Left Arm, Patient Position: Sitting, Cuff Size: Normal)   Pulse (!) 59   Ht 5' 3"  (1.6 m)   Wt 134 lb (60.8 kg)   SpO2 99%   BMI 23.74 kg/m   General: NAD. Neck: No JVD or HJR. Lungs: Mildly diminished breath sounds throughout without wheezes or crackles. Heart: Regular rate and rhythm without murmurs, rubs, or gallops. Abdomen: Soft, nontender, nondistended. Extremities: No lower extremity edema.  EKG: Sinus bradycardia with borderline LVH, poor R wave progression, anterolateral  ST/T changes and inferior ST depressions.  Heart rate has decreased slightly since 09/02/2019.  Inferior ST changes are less pronounced as well.  Lab Results  Component Value Date   WBC 6.0 10/24/2019   HGB 11.5 (L) 10/24/2019   HCT 34.5 (L) 10/24/2019   MCV 87.1 10/24/2019   PLT 265 10/24/2019    Lab Results  Component Value Date   NA 140 09/29/2019   K 3.7 09/29/2019   CL 103 09/29/2019   CO2 29 09/29/2019   BUN 14 09/29/2019   CREATININE 0.76 09/29/2019   GLUCOSE 117 (H) 09/29/2019   ALT 35 (H) 06/30/2019    Lab Results  Component Value Date   CHOL 234 (H) 06/30/2019   HDL 58 06/30/2019   LDLCALC 144 (H) 06/30/2019   TRIG 179 (H) 06/30/2019   CHOLHDL 4.0 06/30/2019    --------------------------------------------------------------------------------------------------  ASSESSMENT AND PLAN: Shortness of breath and leg swelling: Sandra [name] continues to have some chronic exertional dyspnea despite resolution of her leg edema.  I have reviewed images from her leg swelling this spring, which is quite impressive in regard to the degree and asymmetry.  Lower extremity venous duplex at that time was negative for DVT.  I am concerned there could have been a pelvic thrombus  or a extrinsic compression on an iliac vein that precipitated this.  We discussed a CT of the abdomen and pelvis for further evaluation, particularly to exclude pelvic mass, but Sandra [name] wishes to defer this.  Her recent echocardiogram was notable for mild to moderate mitral regurgitation as well as grade 2 diastolic dysfunction.  We have agreed to begin furosemide 20 mg daily to see if that helps improve her symptoms.  Nonobstructive coronary artery disease: Mild plaquing noted in the proximal LAD.  We will continue with medical therapy to prevent progression of disease.  Pulmonary nodules: Continue close follow-up with CT scan in 3 months.  Hypertension: Blood pressure upper normal.  We will add furosemide, as above.  No further medication changes today.  Follow-up: Return to clinic in 1 month.  Nelva Bush, MD 10/29/2019 10:23 AM

## 2019-11-07 NOTE — Progress Notes (Deleted)
     Established patient visit   Patient: Sandra [name]   DOB: [birth year]   74 y.o. Female  MRN: [id number] Visit Date: 11/12/2019  Today's healthcare provider: [name], MD   No chief complaint on file.  Subjective    HPI ***  1. Multiple lung nodules on CT- 10/15/2019 A long talk with Dr. Bary Castilla her breast cancer.  He thinks that it is very low risk this could be metastatic breast cancer. Patient's daughter is a Marine scientist and evidently wants patient to see pulmonology.  I am fine with this. She does have some COPD so we will start her on Spiriva daily.  I will see her back in a month. More than 50% 30 minute visit spent in counseling or coordination of care {Show patient history (optional):23778::" "}   Medications: Outpatient Medications Prior to Visit  Medication Sig  . ALPRAZolam (XANAX) 0.5 MG tablet Take 1 tablet (0.5 mg total) by mouth at bedtime.  [name] amLODipine (NORVASC) 5 MG tablet TAKE 1 TABLET BY MOUTH  DAILY  . atorvastatin (LIPITOR) 40 MG tablet Take 1 tablet (40 mg total) by mouth daily.  . Biotin 1000 MCG tablet Take 1,000 mcg by mouth daily.  . Calcium Carbonate (CALCIUM 600 PO) Take 600 mg by mouth daily at 3 pm.   . carvedilol (COREG) 3.125 MG tablet TAKE 1 TABLET BY MOUTH  TWICE DAILY  . Cholecalciferol (VITAMIN D3) 1000 units CAPS Take 1,000 Units by mouth.  . dicyclomine (BENTYL) 10 MG capsule TAKE 1 CAPSULE BY MOUTH 3  TIMES DAILY BEFORE MEALS  . furosemide (LASIX) 20 MG tablet Take 1 tablet (20 mg total) by mouth daily.  . hydroxypropyl methylcellulose / hypromellose (ISOPTO TEARS / GONIOVISC) 2.5 % ophthalmic solution Place 1 drop into both eyes 4 (four) times daily as needed for dry eyes (scheduled each morning & then as needed).  . latanoprost (XALATAN) 0.005 % ophthalmic solution INT 1 GTT IN OD 1 TIME IN THE EVE  . letrozole (FEMARA) 2.5 MG tablet Take 1 tablet (2.5 mg total) by mouth daily.  [name] levothyroxine (SYNTHROID) 100 MCG tablet TAKE 1  TABLET BY MOUTH 30  MINUTES BEFORE BREAKFAST  . Multiple Vitamin (MULTIVITAMIN WITH MINERALS) TABS tablet Take 1 tablet by mouth daily at 3 pm.  . Omega-3 Fatty Acids (FISH OIL PO) Take 1 capsule by mouth daily.   [name] omeprazole (PRILOSEC) 40 MG capsule TAKE 1 CAPSULE BY MOUTH  DAILY  . sennosides-docusate sodium (SENOKOT-S) 8.6-50 MG tablet Take 1 tablet by mouth as needed for constipation.  . sertraline (ZOLOFT) 100 MG tablet TAKE 1 TABLET BY MOUTH  DAILY  . Simethicone (GAS-X PO) Take 1 tablet by mouth as needed.  . Tiotropium Bromide Monohydrate (SPIRIVA RESPIMAT) 1.25 MCG/ACT AERS Inhale into the lungs.   No facility-administered medications prior to visit.    Review of Systems  {Heme  Chem  Endocrine  Serology  Results Review (optional):23779::" "}  Objective    There were no vitals taken for this visit. {Show previous vital signs (optional):23777::" "}  Physical Exam  ***  No results found for any visits on 11/12/19.  Assessment & Plan     ***  No follow-ups on file.      {provider attestation***:1}   [name], MD  [facility] [phone] (phone) [phone] (fax)  [place]

## 2019-11-11 ENCOUNTER — Other Ambulatory Visit: Payer: Self-pay | Admitting: General Surgery

## 2019-11-11 DIAGNOSIS — Z01810 Encounter for preprocedural cardiovascular examination: Secondary | ICD-10-CM

## 2019-11-11 DIAGNOSIS — C50911 Malignant neoplasm of unspecified site of right female breast: Secondary | ICD-10-CM

## 2019-11-11 DIAGNOSIS — R140 Abdominal distension (gaseous): Secondary | ICD-10-CM

## 2019-11-12 ENCOUNTER — Ambulatory Visit: Payer: Medicare Other | Admitting: Family Medicine

## 2019-11-18 NOTE — Addendum Note (Signed)
Addended by: Annia Belt on: 11/18/2019 12:15 PM   Modules accepted: Orders

## 2019-11-21 ENCOUNTER — Telehealth: Payer: Self-pay | Admitting: Internal Medicine

## 2019-11-21 NOTE — Progress Notes (Signed)
Established patient visit  I,[name],acting as a scribe for [name], MD.,have documented all relevant documentation on the behalf of [name], MD,as directed by  [name], MD while in the presence of [name], MD.   Patient: Sandra [name]   DOB: [birth year]   74 y.o. Female  MRN: [id number] Visit Date: 11/24/2019  Today's healthcare provider: [name], MD   Chief Complaint  Patient presents with  . Follow-up  . COPD   Subjective    HPI  Patient complains of being chronically tired and anxious.  COPD From 10/15/2019-She does have some COPD so we will start her on Spiriva daily.  I will see her back in a month.  Patient states she finished Spriva inhaler. However she states she could not tell any difference when she was using it.  She has follow-up with pulmonology.  States in general her breathing seems to be stable.     Medications: Outpatient Medications Prior to Visit  Medication Sig  . ALPRAZolam (XANAX) 0.5 MG tablet Take 1 tablet (0.5 mg total) by mouth at bedtime.  [name] amLODipine (NORVASC) 5 MG tablet TAKE 1 TABLET BY MOUTH  DAILY  . atorvastatin (LIPITOR) 40 MG tablet Take 1 tablet (40 mg total) by mouth daily.  . Calcium Carbonate (CALCIUM 600 PO) Take 600 mg by mouth daily at 3 pm.   . carvedilol (COREG) 3.125 MG tablet TAKE 1 TABLET BY MOUTH  TWICE DAILY  . Cholecalciferol (VITAMIN D3) 1000 units CAPS Take 1,000 Units by mouth.  . dicyclomine (BENTYL) 10 MG capsule TAKE 1 CAPSULE BY MOUTH 3  TIMES DAILY BEFORE MEALS  . furosemide (LASIX) 20 MG tablet Take 1 tablet (20 mg total) by mouth daily.  . hydroxypropyl methylcellulose / hypromellose (ISOPTO TEARS / GONIOVISC) 2.5 % ophthalmic solution Place 1 drop into both eyes 4 (four) times daily as needed for dry eyes (scheduled each morning & then as needed).  . latanoprost (XALATAN) 0.005 % ophthalmic solution INT 1 GTT IN OD 1 TIME IN THE EVE  . letrozole  (FEMARA) 2.5 MG tablet Take 1 tablet (2.5 mg total) by mouth daily.  [name] levothyroxine (SYNTHROID) 100 MCG tablet TAKE 1 TABLET BY MOUTH 30  MINUTES BEFORE BREAKFAST  . Multiple Vitamin (MULTIVITAMIN WITH MINERALS) TABS tablet Take 1 tablet by mouth daily at 3 pm.  . Omega-3 Fatty Acids (FISH OIL PO) Take 1 capsule by mouth daily.   [name] omeprazole (PRILOSEC) 40 MG capsule TAKE 1 CAPSULE BY MOUTH  DAILY  . sennosides-docusate sodium (SENOKOT-S) 8.6-50 MG tablet Take 1 tablet by mouth as needed for constipation.  . sertraline (ZOLOFT) 100 MG tablet TAKE 1 TABLET BY MOUTH  DAILY  . Simethicone (GAS-X PO) Take 1 tablet by mouth as needed.  . Tiotropium Bromide Monohydrate (SPIRIVA RESPIMAT) 1.25 MCG/ACT AERS Inhale into the lungs.  . Biotin 1000 MCG tablet Take 1,000 mcg by mouth daily. (Patient not taking: Reported on 11/24/2019)   No facility-administered medications prior to visit.    Review of Systems  Constitutional: Negative for appetite change, chills, fatigue and fever.  Respiratory: Negative for chest tightness and shortness of breath.   Cardiovascular: Negative for chest pain and palpitations.  Gastrointestinal: Negative for abdominal pain, nausea and vomiting.  Neurological: Negative for dizziness and weakness.       Objective    BP 105/63 (BP Location: Left Arm, Patient Position: Sitting, Cuff Size: Large)   Pulse [name])  59   Temp (!) 96.8 F (36 C) (Other (Comment))   Resp 18   Ht 5' 3"  (1.6 m)   Wt 135 lb (61.2 kg)   SpO2 97%   BMI 23.91 kg/m  Wt Readings from Last 3 Encounters:  11/24/19 135 lb (61.2 kg)  10/29/19 134 lb (60.8 kg)  10/24/19 134 lb (60.8 kg)      Depression screen [facility] 2/9 11/24/2019 04/29/2019 04/22/2018  Decreased Interest 0 0 0  Down, Depressed, Hopeless 1 0 0  PHQ - 2 Score 1 0 0  Altered sleeping 0 - -  Tired, decreased energy 3 - -  Change in appetite 3 - -  Feeling bad or failure about yourself  0 - -  Trouble concentrating 0 - -  Moving  slowly or fidgety/restless 1 - -  Suicidal thoughts 0 - -  PHQ-9 Score 8 - -  Difficult doing work/chores Somewhat difficult - -   GAD 7 : Generalized Anxiety Score 11/24/2019  Nervous, Anxious, on Edge 1  Control/stop worrying 1  Worry too much - different things 1  Trouble relaxing 0  Restless 0  Easily annoyed or irritable 0  Afraid - awful might happen 0  Total GAD 7 Score 3  Anxiety Difficulty Somewhat difficult   MMSE - Mini Mental State Exam 11/24/2019  Orientation to time 5  Orientation to Place 5  Registration 3  Attention/ Calculation 5  Recall 3  Language- name 2 objects 2  Language- repeat 1  Language- follow 3 step command 3  Language- read & follow direction 1  Write a sentence 1  Copy design 1  Total score 30     Physical Exam    No results found for any visits on 11/24/19.  Assessment & Plan     1. Chronic obstructive pulmonary disease, unspecified COPD type ([place]) Followed by pulmonary.  2. Abdominal discomfort She is on omeprazole.  Obtain baseline lab work.  She recently had abdominal and pelvic CT scan. Scan did reveal bilateral nonobstructing nephrolithiasis. - POCT urinalysis dipstick--Normal 3. Other hyperlipidemia  - Ammonia - CBC with Differential/Platelet - TSH - Comprehensive metabolic panel  4. Other specified hypothyroidism  - Ammonia - CBC with Differential/Platelet - TSH - Comprehensive metabolic panel  5. Essential hypertension  - Ammonia - CBC with Differential/Platelet - TSH - Comprehensive metabolic panel  6. Osteoporosis, unspecified osteoporosis type, unspecified pathological fracture presence   7. Generalized anxiety disorder Ongoing issue for this patient.  8. Invasive ductal carcinoma of breast, female, right ([place]) Clinically followed by oncology and breast surgery   No follow-ups on file.         Diasha Castleman Cranford Mon, MD  [facility] [phone] (phone) [phone] (fax)  [place]

## 2019-11-21 NOTE — Telephone Encounter (Signed)
Spoke with the patient. Patient sts that she has scheduled her CTA of the abdomen on 11/26/19.  She would like to know when she needs to get her BMET. Adv her that [facility]. The order is in [place]. Patient sts that she was told when she scheduled her CTA that she needs to pick up a prep and doesn't know where and when she should take it. Adv he patient to contact the Radiology dept today she that can provide her that info.  Patients was very appreciative for the call back and the assistance.

## 2019-11-21 NOTE — Telephone Encounter (Signed)
Please call to discuss her upcoming CT. Patient has some questions.

## 2019-11-24 ENCOUNTER — Other Ambulatory Visit: Payer: Self-pay

## 2019-11-24 ENCOUNTER — Encounter: Payer: Self-pay | Admitting: Family Medicine

## 2019-11-24 ENCOUNTER — Ambulatory Visit (INDEPENDENT_AMBULATORY_CARE_PROVIDER_SITE_OTHER): Payer: Medicare Other | Admitting: Family Medicine

## 2019-11-24 VITALS — BP 105/63 | HR 59 | Temp 96.8°F | Resp 18 | Ht 63.0 in | Wt 135.0 lb

## 2019-11-24 DIAGNOSIS — I10 Essential (primary) hypertension: Secondary | ICD-10-CM

## 2019-11-24 DIAGNOSIS — J449 Chronic obstructive pulmonary disease, unspecified: Secondary | ICD-10-CM

## 2019-11-24 DIAGNOSIS — E038 Other specified hypothyroidism: Secondary | ICD-10-CM

## 2019-11-24 DIAGNOSIS — R109 Unspecified abdominal pain: Secondary | ICD-10-CM

## 2019-11-24 DIAGNOSIS — F411 Generalized anxiety disorder: Secondary | ICD-10-CM

## 2019-11-24 DIAGNOSIS — E7849 Other hyperlipidemia: Secondary | ICD-10-CM

## 2019-11-24 DIAGNOSIS — M810 Age-related osteoporosis without current pathological fracture: Secondary | ICD-10-CM

## 2019-11-24 DIAGNOSIS — C50911 Malignant neoplasm of unspecified site of right female breast: Secondary | ICD-10-CM

## 2019-11-24 LAB — POCT URINALYSIS DIPSTICK
Appearance: NORMAL
Bilirubin, UA: NEGATIVE
Blood, UA: NEGATIVE
Glucose, UA: NEGATIVE
Ketones, UA: NEGATIVE
Leukocytes, UA: NEGATIVE
Nitrite, UA: NEGATIVE
Odor: NORMAL
Protein, UA: NEGATIVE
Spec Grav, UA: 1.01 (ref 1.010–1.025)
Urobilinogen, UA: 0.2 E.U./dL
pH, UA: 7 (ref 5.0–8.0)

## 2019-11-25 LAB — COMPREHENSIVE METABOLIC PANEL
ALT: 34 IU/L — ABNORMAL HIGH (ref 0–32)
AST: 25 IU/L (ref 0–40)
Albumin/Globulin Ratio: 1.7 (ref 1.2–2.2)
Albumin: 4.1 g/dL (ref 3.7–4.7)
Alkaline Phosphatase: 91 IU/L (ref 48–121)
BUN/Creatinine Ratio: 14 (ref 12–28)
BUN: 10 mg/dL (ref 8–27)
Bilirubin Total: 0.6 mg/dL (ref 0.0–1.2)
CO2: 28 mmol/L (ref 20–29)
Calcium: 9.1 mg/dL (ref 8.7–10.3)
Chloride: 105 mmol/L (ref 96–106)
Creatinine, Ser: 0.72 mg/dL (ref 0.57–1.00)
GFR calc Af Amer: 95 mL/min/{1.73_m2} (ref >59)
GFR calc non Af Amer: 83 mL/min/{1.73_m2} (ref >59)
Globulin, Total: 2.4 g/dL (ref 1.5–4.5)
Glucose: 101 mg/dL — ABNORMAL HIGH (ref 65–99)
Potassium: 4.4 mmol/L (ref 3.5–5.2)
Sodium: 142 mmol/L (ref 134–144)
Total Protein: 6.5 g/dL (ref 6.0–8.5)

## 2019-11-25 LAB — CBC WITH DIFFERENTIAL/PLATELET
Basophils Absolute: 0 10*3/uL (ref 0.0–0.2)
Basos: 0 %
EOS (ABSOLUTE): 0 10*3/uL (ref 0.0–0.4)
Eos: 1 %
Hematocrit: 35.4 % (ref 34.0–46.6)
Hemoglobin: 11.7 g/dL (ref 11.1–15.9)
Immature Grans (Abs): 0 10*3/uL (ref 0.0–0.1)
Immature Granulocytes: 0 %
Lymphocytes Absolute: 1.7 10*3/uL (ref 0.7–3.1)
Lymphs: 31 %
MCH: 29.3 pg (ref 26.6–33.0)
MCHC: 33.1 g/dL (ref 31.5–35.7)
MCV: 89 fL (ref 79–97)
Monocytes Absolute: 0.6 10*3/uL (ref 0.1–0.9)
Monocytes: 10 %
Neutrophils Absolute: 3.2 10*3/uL (ref 1.4–7.0)
Neutrophils: 58 %
Platelets: 238 10*3/uL (ref 150–450)
RBC: 3.99 x10E6/uL (ref 3.77–5.28)
RDW: 13.1 % (ref 11.7–15.4)
WBC: 5.5 10*3/uL (ref 3.4–10.8)

## 2019-11-25 LAB — AMMONIA: Ammonia: 45 ug/dL (ref 31–169)

## 2019-11-25 LAB — TSH: TSH: 2.3 u[IU]/mL (ref 0.450–4.500)

## 2019-11-26 ENCOUNTER — Ambulatory Visit
Admission: RE | Admit: 2019-11-26 | Discharge: 2019-11-26 | Disposition: A | Discharge status: Home / Self Care | Payer: Medicare Other | Source: Ambulatory Visit | Attending: Internal Medicine | Admitting: Internal Medicine

## 2019-11-26 ENCOUNTER — Other Ambulatory Visit: Payer: Self-pay

## 2019-11-26 DIAGNOSIS — I700 Atherosclerosis of aorta: Secondary | ICD-10-CM | POA: Diagnosis not present

## 2019-11-26 DIAGNOSIS — R140 Abdominal distension (gaseous): Secondary | ICD-10-CM | POA: Insufficient documentation

## 2019-11-26 DIAGNOSIS — K6389 Other specified diseases of intestine: Secondary | ICD-10-CM | POA: Diagnosis not present

## 2019-11-26 DIAGNOSIS — R918 Other nonspecific abnormal finding of lung field: Secondary | ICD-10-CM | POA: Insufficient documentation

## 2019-11-26 DIAGNOSIS — N200 Calculus of kidney: Secondary | ICD-10-CM | POA: Insufficient documentation

## 2019-11-26 DIAGNOSIS — R197 Diarrhea, unspecified: Secondary | ICD-10-CM | POA: Diagnosis not present

## 2019-11-26 MED ORDER — IOHEXOL 300 MG/ML  SOLN
100.0000 mL | Freq: Once | INTRAMUSCULAR | Status: AC | PRN
  Administered 2019-11-26: 100 mL via INTRAVENOUS

## 2019-11-27 ENCOUNTER — Encounter: Payer: Self-pay | Admitting: Family Medicine

## 2019-11-27 ENCOUNTER — Telehealth: Payer: Self-pay

## 2019-11-27 NOTE — Telephone Encounter (Signed)
Pt is aware of date/time of covid test. Pt voiced her understanding and had no further questions.  Nothing further is needed.

## 2019-11-28 NOTE — Telephone Encounter (Signed)
Very difficult to compare the lung image from the CT abdomen currently to the CT chest in May.  Anyway the plan is to get a CT chest without contrast in mid August 2021.  Therefore we will just do that

## 2019-11-28 NOTE — Telephone Encounter (Signed)
Dr Chase Caller, see message from patient, please advise.  Hi, this is Sandra [name]'s daughter, Sandra [name]. She just recently had an abdominal and pelvic CT scan ordered by her cardiologist. The results mention the visible lung nodules in the bases of her lungs. I compared the results to the chest CT she had in June I think? This newest CT scan mentions the nodules being 37m in both bases (previous CT stated 345m. My concern is that if they are the same as the previously mentioned nodules and if they are they have increased in size. Just wondering what next steps would be?

## 2019-12-01 NOTE — Progress Notes (Signed)
Cardiology Office Note    Date:  12/03/2019   ID:  Sandra [name], DOB [birth year], MRN [id number]  PCP:  Jerrol Banana., MD  Cardiologist:  Nelva Bush, MD  Electrophysiologist:  None   Chief Complaint: Follow-up  History of Present Illness:   Sandra [name] is a 74 y.o. female with history of HFpEF, right-sided breast cancer status post mastectomy without chemoradiation, celiac disease, Raynaud's disease, hypothyroidism, RLS, GERD, and anxiety who presents for follow up of HFpEF.  She was previously evaluated by Dr. Clayborn Bigness for preoperative evaluation in the setting of right mastectomy in the summer 2018.  She was found to have an abnormal EKG and cleared for surgery without additional testing.  She indicated she had abnormal EKGs dating back to 2012 at which time she was evaluated by Dr. Ubaldo Glassing and underwent stress test which was normal per her report.  She has subsequently established with Dr. [name] in 12/2016.  Echo on 01/18/2017 showed an EF of 60 to 65%, mild concentric LVH with moderate hypertrophy of the septum with near cavity obliteration with systole with no significant outflow tract gradient measured, no regional wall motion abnormalities, grade 2 diastolic dysfunction, mild mitral regurgitation, mildly dilated left atrium, normal RV systolic function and RVSP.  Treadmill MPI in 01/2017 showed a hypertensive response to exercise with no significant ischemia or infarct.  LVEF was hyperdynamic at greater than 65%.  She was seen in the office in 11/2017 for follow-up of DOE and was doing well with stable symptoms.  She noted improvement in her BP with the addition of carvedilol and amlodipine.  She was evaluated by her PCP in 07/2019 for follow-up of leg pain and swelling involving the right lower extremity felt to be related to her history of OA.  It was felt she may need corticosteroid injection with possible aspiration of synovial cyst involving the right popliteal space.  Lower  extremity venous ultrasound was negative for DVT.  She was referred to orthopedics who felt her symptoms were related to bursitis of the right hip and osteoarthritis of the right knee.  She was seen by cardiology in 08/2019 with a several month history of exertional dyspnea while ambulating up an incline, weight gain, and right lower extremity swelling.  Echo in 08/2019 showed an EF of 60-65%, no RWMA, mild LVH, Gr2DD, normal RVSF and ventricular cavity size, normal PASP, mildly dilated left atrium, mild to moderate mitral regurgitation. Coronary CTA in 09/2019 showed a calcium score of 9 which was 35th percentile for age and sex matched control with calcified plaque in the proximal LAD estimated at 0 to 24% with otherwise nonobstructive disease. Incidentally, there were bilateral indeterminate pulmonary nodules measuring up to 7 mm. She underwent follow-up chest CT 8 days later which redemonstrated multiple pulmonary nodules throughout the bilateral lungs with the largest measuring 7 mm in the right middle lobe with short-term follow-up versus tissue sampling recommended given the patient's history of breast cancer. She followed up with her primary cardiologist on 10/29/2019 and was doing relatively well. She had not had any further swelling of the right leg though continued to have some shortness of breath and cough. She denied any chest pain or palpitations.  She was started on low-dose Lasix with subsequent follow-up labs demonstrating stable renal function and potassium as outlined below.  There was discussion of performing CT abdomen pelvis to evaluate for pelvic thrombus versus extrinsic compression of an iliac vein given her prior history of right  lower extremity swelling. However, she preferred to defer this at that time. She subsequently underwent this imaging on 11/26/2019 which demonstrated no acute findings in the abdomen or pelvis, specifically no findings to explain the patient's history of abdominal  distention and bloating. Tiny bilateral pulmonary nodules are noted in the lung bases measuring up to 5 mm. Incidentally bilateral nonobstructing nephrolithiasis and aortic atherosclerosis was noted.  She comes in today noting improvement/resolution of the right lower extremity swelling since she was last seen without recurrence.  She does continue to note dyspnea on walking up an incline as well as dizziness when bending over.  No chest pains, palpitations, lower extremity swelling, presyncope, or syncope.  She is scheduled for PFTs tomorrow, 7/8.  She will have follow-up CT imaging of her chest for pulmonary nodules next month.  Weight has been stable.  She continues to note headaches that began along the left shoulder and posterior neck that radiate to the frontal region.  She did not note significant improvement in her symptoms following addition of Spiriva or Lasix.  She feels like her urine output has dropped off some with the addition of Lasix.   Labs independently reviewed: 10/2019 - BUN 10, serum creatinine 0.72, potassium 4.4, albumin 4.1, AST normal, ALT 34, TSH normal, Hgb 11.7, PLT 238 06/2019 -TC 234, TG 179, HDL 58, LDL 144  Past Medical History:  Diagnosis Date  . Abnormality of left ventricle of heart    MILD-MODERATE THICKENING PER ECHO ON 01-18-17  . Anemia   . Anxiety   . Arthritis   . BRCA gene mutation negative 10/2016   NEGATIVE: [employer]  . Breast cancer ([place]) 10/16/2016   T1c,N0;, GRADE I/III, 1.6cm. ER/PR pos  HER2 not over expressed, Right Upper Outer  . Celiac disease   . Depression   . Diastolic dysfunction   . Dyspnea    WITH EXERTION  . GERD (gastroesophageal reflux disease)    OCC  . Glaucoma    right eye  . Headache    H/O MIGRAINES AS TEENAGER  . Heart murmur    PT WAS TOLD ONCE SHE HAD MVP AND THEN ANOTHER MD SAID SHE DID NOT HAVE MVP  . Hypertension   . Hypothyroidism   . Pneumonia    YEARS AGO  . Raynaud's disease   . RLS (restless legs  syndrome)     Past Surgical History:  Procedure Laterality Date  . ABDOMINAL HYSTERECTOMY    . APPENDECTOMY    . AXILLARY LYMPH NODE BIOPSY Right 03/05/2017   Procedure: AXILLARY LYMPH NODE BIOPSY;  Surgeon: Robert Bellow, MD;  Location: [facility] ORS;  Service: General;  Laterality: Right;  . CATARACT EXTRACTION Bilateral   . CESAREAN SECTION    . COLONOSCOPY WITH PROPOFOL N/A 05/19/2015   Procedure: COLONOSCOPY WITH PROPOFOL;  Surgeon: Manya Silvas, MD;  Location: [facility] ENDOSCOPY;  Service: Endoscopy;  Laterality: N/A;  . COLONOSCOPY WITH PROPOFOL N/A 08/01/2019   Procedure: COLONOSCOPY WITH PROPOFOL;  Surgeon: Lucilla Lame, MD;  Location: [facility] ENDOSCOPY;  Service: Endoscopy;  Laterality: N/A;  . EYE SURGERY    . HAND SURGERY Left   . MASTECTOMY Right 11/07/2016   RESIDUAL INVASIVE MAMMARY CARCINOMA, SUBAREOLAR ANTERIOR TO PREVIOUS   . SENTINEL NODE BIOPSY Right 11/07/2016   Procedure: SENTINEL NODE BIOPSY;  Surgeon: Robert Bellow, MD;  Location: [facility] ORS;  Service: General;  Laterality: Right;  . SIMPLE MASTECTOMY WITH AXILLARY SENTINEL NODE BIOPSY Right 11/07/2016   6 mm ER/PR 100%;  Her 2 neu not overexpressed, T1b, N0.  Surgeon: Robert Bellow, MD;  Location: [facility] ORS;  Service: General;  Laterality: Right;  . TONSILLECTOMY  AGE 85  . UPPER GI ENDOSCOPY  01/12/06   hiatus hernia    Current Medications: Current Meds  Medication Sig  . ALPRAZolam (XANAX) 0.5 MG tablet Take 1 tablet (0.5 mg total) by mouth at bedtime.  [name] amLODipine (NORVASC) 5 MG tablet TAKE 1 TABLET BY MOUTH  DAILY  . atorvastatin (LIPITOR) 40 MG tablet Take 1 tablet (40 mg total) by mouth daily.  . Calcium Carbonate (CALCIUM 600 PO) Take 600 mg by mouth daily at 3 pm.   . carvedilol (COREG) 3.125 MG tablet TAKE 1 TABLET BY MOUTH  TWICE DAILY  . Cholecalciferol (VITAMIN D3) 1000 units CAPS Take 1,000 Units by mouth.  . dicyclomine (BENTYL) 10 MG capsule TAKE 1 CAPSULE BY MOUTH 3  TIMES DAILY BEFORE MEALS   . furosemide (LASIX) 20 MG tablet Take 1 tablet (20 mg total) by mouth daily.  . hydroxypropyl methylcellulose / hypromellose (ISOPTO TEARS / GONIOVISC) 2.5 % ophthalmic solution Place 1 drop into both eyes 4 (four) times daily as needed for dry eyes (scheduled each morning & then as needed).  . latanoprost (XALATAN) 0.005 % ophthalmic solution INT 1 GTT IN OD 1 TIME IN THE EVE  . letrozole (FEMARA) 2.5 MG tablet Take 1 tablet (2.5 mg total) by mouth daily.  [name] levothyroxine (SYNTHROID) 100 MCG tablet TAKE 1 TABLET BY MOUTH 30  MINUTES BEFORE BREAKFAST  . Multiple Vitamin (MULTIVITAMIN WITH MINERALS) TABS tablet Take 1 tablet by mouth daily at 3 pm.  . Omega-3 Fatty Acids (FISH OIL PO) Take 1 capsule by mouth daily.   [name] omeprazole (PRILOSEC) 40 MG capsule TAKE 1 CAPSULE BY MOUTH  DAILY  . sennosides-docusate sodium (SENOKOT-S) 8.6-50 MG tablet Take 1 tablet by mouth as needed for constipation.  . sertraline (ZOLOFT) 100 MG tablet TAKE 1 TABLET BY MOUTH  DAILY  . Simethicone (GAS-X PO) Take 1 tablet by mouth as needed.    Allergies:   Patient has no known allergies.   Social History   Socioeconomic History  . Marital status: Married    Spouse name: Not on file  . Number of children: 2  . Years of education: Not on file  . Highest education level: Some college, no degree  Occupational History  . Occupation: retired  Tobacco Use  . Smoking status: Never Smoker  . Smokeless tobacco: Never Used  Vaping Use  . Vaping Use: Never used  Substance and Sexual Activity  . Alcohol use: Yes    Comment: 0-2 glasses of wine a month  . Drug use: No  . Sexual activity: Not Currently    Birth control/protection: None  Other Topics Concern  . Not on file  Social History Narrative  . Not on file   Social Determinants of Health   Financial Resource Strain:   . Difficulty of Paying Living Expenses:   Food Insecurity:   . Worried About Charity fundraiser in the Last Year:   . Arboriculturist  in the Last Year:   Transportation Needs:   . Film/video editor (Medical):   [name] Lack of Transportation (Non-Medical):   Physical Activity:   . Days of Exercise per Week:   . Minutes of Exercise per Session:   Stress: No Stress Concern Present  . Feeling of Stress : Not at all  Social Connections:   .  Frequency of Communication with Friends and Family:   . Frequency of Social Gatherings with Friends and Family:   . Attends Religious Services:   . Active Member of Clubs or Organizations:   . Attends Archivist Meetings:   [name] Marital Status:      Family History:  The patient's family history includes Alcohol abuse in her father; Anemia in her mother; Breast cancer in her sister; Breast cancer (age of onset: 59) in her maternal grandmother; CAD in her paternal grandfather; Cancer in her mother and sister; Heart attack in her maternal grandfather; Other in her mother; Prostate cancer in her father; Stroke in her maternal grandmother.  ROS:   Review of Systems  Constitutional: Positive for malaise/fatigue. Negative for chills, diaphoresis, fever and weight loss.  HENT: Negative for congestion.   Eyes: Negative for discharge and redness.  Respiratory: Positive for shortness of breath. Negative for cough, sputum production and wheezing.   Cardiovascular: Negative for chest pain, palpitations, orthopnea, claudication, leg swelling and PND.  Gastrointestinal: Negative for abdominal pain, heartburn, nausea and vomiting.  Musculoskeletal: Negative for falls and myalgias.  Skin: Negative for rash.  Neurological: Positive for dizziness, weakness and headaches. Negative for tingling, tremors, sensory change, speech change, focal weakness and loss of consciousness.  Endo/Heme/Allergies: Does not bruise/bleed easily.  Psychiatric/Behavioral: Negative for substance abuse. The patient is not nervous/anxious.   All other systems reviewed and are negative.    EKGs/Labs/Other Studies  Reviewed:    Studies reviewed were summarized above. The additional studies were reviewed today:  2D echo 08/2019: 1. Left ventricular ejection fraction, by estimation, is 60 to 65%. The  left ventricle has normal function. The left ventricle has no regional  wall motion abnormalities. There is mild left ventricular hypertrophy.  Left ventricular diastolic parameters  are consistent with Grade II diastolic dysfunction (pseudonormalization).  2. Right ventricular systolic function is normal. The right ventricular  size is normal. There is normal pulmonary artery systolic pressure.  3. Left atrial size was mildly dilated.  4. Mild to moderate mitral valve regurgitation. __________  Coronary CTA 09/2014: IMPRESSION: 1. Coronary calcium score of 9.29. This was 35th percentile for age and sex matched control.  2. Normal coronary origin with right dominance.  3. Calcified plaque in the proximal LAD minimal non obstructive disease  4. CAD-RADS 1. Minimal non-obstructive CAD (0-24%). Consider non-atherosclerotic causes of chest pain. Consider preventive therapy and risk factor modification.  IMPRESSION: Bilateral indeterminate pulmonary nodules measuring up to 7 mm. Pulmonary metastases cannot be excluded in this patient with history of breast carcinoma. Complete chest CT without contrast is recommended further evaluation.  EKG:  EKG is ordered today.  The EKG ordered today demonstrates NSR, 63 bpm, nonspecific inferior and anterolateral ST-T changes which appear to be slightly more pronounced along the lateral leads when compared to prior  Recent Labs: 11/24/2019: ALT 34; BUN 10; Creatinine, Ser 0.72; Hemoglobin 11.7; Platelets 238; Potassium 4.4; Sodium 142; TSH 2.300  Recent Lipid Panel    Component Value Date/Time   CHOL 234 (H) 06/30/2019 1145   TRIG 179 (H) 06/30/2019 1145   HDL 58 06/30/2019 1145   CHOLHDL 4.0 06/30/2019 1145   LDLCALC 144 (H) 06/30/2019 1145     PHYSICAL EXAM:    VS:  BP (!) 110/52 (BP Location: Left Arm, Patient Position: Sitting, Cuff Size: Normal)   Pulse 63   Ht 5' 3"  (1.6 m)   Wt 135 lb 4 oz (61.3 kg)   SpO2  98%   BMI 23.96 kg/m   BMI: Body mass index is 23.96 kg/m.  Physical Exam Constitutional:      Appearance: She is well-developed.  HENT:     Head: Normocephalic and atraumatic.  Eyes:     General:        Right eye: No discharge.        Left eye: No discharge.  Neck:     Vascular: No JVD.  Cardiovascular:     Rate and Rhythm: Normal rate and regular rhythm.     Pulses: No midsystolic click and no opening snap.          Posterior tibial pulses are 2+ on the right side and 2+ on the left side.     Heart sounds: S1 normal and S2 normal. Heart sounds not distant. Murmur heard. High-pitched blowing holosystolic murmur is present with a grade of 2/6 at the apex.  No friction rub.  Pulmonary:     Effort: Pulmonary effort is normal. No respiratory distress.     Breath sounds: Normal breath sounds. No decreased breath sounds, wheezing or rales.  Chest:     Chest wall: No tenderness.  Abdominal:     General: There is no distension.     Palpations: Abdomen is soft.     Tenderness: There is no abdominal tenderness.  Musculoskeletal:     Cervical back: Normal range of motion.     Left lower leg: No edema.  Skin:    General: Skin is warm and dry.     Nails: There is no clubbing.  Neurological:     Mental Status: She is alert and oriented to person, place, and time.  Psychiatric:        Speech: Speech normal.        Behavior: Behavior normal.        Thought Content: Thought content normal.        Judgment: Judgment normal.     Wt Readings from Last 3 Encounters:  12/03/19 135 lb 4 oz (61.3 kg)  11/24/19 135 lb (61.2 kg)  10/29/19 134 lb (60.8 kg)     Orthostatic vital signs: Lying: 115/69, 62 bpm Sitting: 109/63, 62 bpm, lightheaded Standing: 119/62, 68 bpm Standing x3 minutes: 114/64, 66 bpm   ASSESSMENT & PLAN:   1. Nonobstructive CAD: Coronary CTA with a calcium score of 9 with minimal nonobstructive disease within the LAD.  Continue current medical therapy including atorvastatin and carvedilol.  No plans for further ischemic evaluation at this time.  2. HFpEF/right lower extremity swelling/dyspnea/dizziness: She appears euvolemic and well compensated.  Lower extremity ultrasound negative for DVT.  CT of the abdomen pelvis without significant finding/explanation for her unilateral lower extremity swelling.  She has had resolution of the right lower extremity swelling.  She did not notice significant improvement with addition of Spiriva or Lasix.  She is scheduled for PFTs on 7/8.  We will transition her Lasix to as needed dosing as outlined below.  With regards to her dizziness we are tapering amlodipine and transitioning Lasix as outlined below.  3. Mitral regurgitation: Mild to moderate on most recent echo.  Continue to monitor.  4. Pulmonary nodules: Managed by pulmonology.  She has repeat CT imaging scheduled for next month.  5. HTN: Blood pressure is on the soft side at triage 110/52.  Orthostatic vital signs normal in the office today.  With relative hypotension we have elected to decrease amlodipine to 2.5 mg daily and transition her furosemide  to as needed dosing for increased dyspnea or return of lower extremity swelling.  6. HLD: LDL of 144.  Remains on atorvastatin 40 mg daily.  With mild coronary artery calcium and aortic atherosclerosis, if LDL remains above goal and recheck in several months time we will plan to escalate her lipid therapy.  Disposition: F/u with Dr. [name] or an APP in 4 weeks.   Medication Adjustments/Labs and Tests Ordered: Current medicines are reviewed at length with the patient today.  Concerns regarding medicines are outlined above. Medication changes, Labs and Tests ordered today are summarized above and listed in the Patient Instructions accessible  in Encounters.   Signed, Christell Faith, PA-C 12/03/2019 10:48 AM     [place] [address] [place], [place] [address] [phone]

## 2019-12-02 ENCOUNTER — Other Ambulatory Visit: Payer: Self-pay

## 2019-12-02 ENCOUNTER — Other Ambulatory Visit
Admission: RE | Admit: 2019-12-02 | Discharge: 2019-12-02 | Disposition: A | Discharge status: Home / Self Care | Payer: Medicare Other | Source: Ambulatory Visit | Attending: Internal Medicine | Admitting: Internal Medicine

## 2019-12-02 DIAGNOSIS — Z01812 Encounter for preprocedural laboratory examination: Secondary | ICD-10-CM | POA: Diagnosis not present

## 2019-12-02 DIAGNOSIS — Z20822 Contact with and (suspected) exposure to covid-19: Secondary | ICD-10-CM | POA: Insufficient documentation

## 2019-12-02 LAB — SARS CORONAVIRUS 2 (TAT 6-24 HRS): SARS Coronavirus 2: NEGATIVE

## 2019-12-03 ENCOUNTER — Ambulatory Visit: Payer: Medicare Other | Admitting: Physician Assistant

## 2019-12-03 ENCOUNTER — Encounter: Payer: Self-pay | Admitting: Physician Assistant

## 2019-12-03 VITALS — BP 110/52 | HR 63 | Ht 63.0 in | Wt 135.2 lb

## 2019-12-03 DIAGNOSIS — I340 Nonrheumatic mitral (valve) insufficiency: Secondary | ICD-10-CM

## 2019-12-03 DIAGNOSIS — I10 Essential (primary) hypertension: Secondary | ICD-10-CM

## 2019-12-03 DIAGNOSIS — M7989 Other specified soft tissue disorders: Secondary | ICD-10-CM | POA: Diagnosis not present

## 2019-12-03 DIAGNOSIS — I2510 Atherosclerotic heart disease of native coronary artery without angina pectoris: Secondary | ICD-10-CM

## 2019-12-03 DIAGNOSIS — R0600 Dyspnea, unspecified: Secondary | ICD-10-CM | POA: Diagnosis not present

## 2019-12-03 DIAGNOSIS — R918 Other nonspecific abnormal finding of lung field: Secondary | ICD-10-CM

## 2019-12-03 DIAGNOSIS — R0609 Other forms of dyspnea: Secondary | ICD-10-CM

## 2019-12-03 DIAGNOSIS — E785 Hyperlipidemia, unspecified: Secondary | ICD-10-CM

## 2019-12-03 DIAGNOSIS — I5032 Chronic diastolic (congestive) heart failure: Secondary | ICD-10-CM

## 2019-12-03 MED ORDER — AMLODIPINE BESYLATE 2.5 MG PO TABS: 2.5000 mg | ORAL_TABLET | Freq: Every day | ORAL | 1 refills | Status: DC

## 2019-12-03 MED ORDER — FUROSEMIDE 20 MG PO TABS: 20.0000 mg | ORAL_TABLET | Freq: Every day | ORAL | Status: DC | PRN

## 2019-12-03 NOTE — Patient Instructions (Signed)
Medication Instructions:  Your physician has recommended you make the following change in your medication:   1) REDUCE Amlodipine to 2.5 mg daily. An Rx has been sent to your pharmacy.  2) CHANGE Lasix to 20 mg daily as needed for shortness of breath or lower extremity swelling  *If you need a refill on your cardiac medications before your next appointment, please call your pharmacy*   Lab Work: bmet today If you have labs (blood work) drawn today and your tests are completely normal, you will receive your results only by: [name] MyChart Message (if you have MyChart) OR . A paper copy in the mail If you have any lab test that is abnormal or we need to change your treatment, we will call you to review the results.   Testing/Procedures: None ordered   Follow-Up: At [facility], you and your health needs are our priority.  As part of our continuing mission to provide you with exceptional heart care, we have created designated Provider Care Teams.  These Care Teams include your primary Cardiologist (physician) and Advanced Practice Providers (APPs -  Physician Assistants and Nurse Practitioners) who all work together to provide you with the care you need, when you need it.  We recommend signing up for the patient portal called "MyChart".  Sign up information is provided on this After Visit Summary.  MyChart is used to connect with patients for Virtual Visits (Telemedicine).  Patients are able to view lab/test results, encounter notes, upcoming appointments, etc.  Non-urgent messages can be sent to your provider as well.   To learn more about what you can do with MyChart, go to [email or web address].    Your next appointment:   4 week(s)  The format for your next appointment:   In Person  Provider:    You may see Nelva Bush, MD or one of the following Advanced Practice Providers on your designated Care Team:    Murray Hodgkins, NP  Christell Faith, PA-C  Marrianne Mood,  PA-C    Other Instructions Please call the office on 2 weeks to provide an update on how you are doing.

## 2019-12-04 ENCOUNTER — Other Ambulatory Visit: Payer: Self-pay

## 2019-12-04 ENCOUNTER — Ambulatory Visit (INDEPENDENT_AMBULATORY_CARE_PROVIDER_SITE_OTHER): Payer: Medicare Other | Admitting: Internal Medicine

## 2019-12-04 DIAGNOSIS — R0602 Shortness of breath: Secondary | ICD-10-CM

## 2019-12-04 LAB — PULMONARY FUNCTION TEST
DL/VA % pred: 105 %
DL/VA: 4.37 ml/min/mmHg/L
DLCO cor % pred: 87 %
DLCO cor: 16.13 ml/min/mmHg
DLCO unc % pred: 82 %
DLCO unc: 15.22 ml/min/mmHg
FEF 25-75 Post: 1.98 L/sec
FEF 25-75 Pre: 1.69 L/sec
FEF2575-%Change-Post: 17 %
FEF2575-%Pred-Post: 119 %
FEF2575-%Pred-Pre: 102 %
FEV1-%Change-Post: 4 %
FEV1-%Pred-Post: 83 %
FEV1-%Pred-Pre: 79 %
FEV1-Post: 1.7 L
FEV1-Pre: 1.63 L
FEV1FVC-%Change-Post: 3 %
FEV1FVC-%Pred-Pre: 107 %
FEV6-%Change-Post: 1 %
FEV6-%Pred-Post: 78 %
FEV6-%Pred-Pre: 77 %
FEV6-Post: 2.04 L
FEV6-Pre: 2.02 L
FEV6FVC-%Change-Post: 0 %
FEV6FVC-%Pred-Post: 105 %
FEV6FVC-%Pred-Pre: 105 %
FVC-%Change-Post: 0 %
FVC-%Pred-Post: 74 %
FVC-%Pred-Pre: 73 %
FVC-Post: 2.04 L
FVC-Pre: 2.02 L
Post FEV1/FVC ratio: 84 %
Post FEV6/FVC ratio: 100 %
Pre FEV1/FVC ratio: 81 %
Pre FEV6/FVC Ratio: 100 %
RV % pred: 76 %
RV: 1.7 L
TLC % pred: 77 %
TLC: 3.78 L

## 2019-12-04 LAB — BASIC METABOLIC PANEL
BUN/Creatinine Ratio: 16 (ref 12–28)
BUN: 12 mg/dL (ref 8–27)
CO2: 26 mmol/L (ref 20–29)
Calcium: 8.9 mg/dL (ref 8.7–10.3)
Chloride: 107 mmol/L — ABNORMAL HIGH (ref 96–106)
Creatinine, Ser: 0.73 mg/dL (ref 0.57–1.00)
GFR calc Af Amer: 94 mL/min/{1.73_m2} (ref >59)
GFR calc non Af Amer: 81 mL/min/{1.73_m2} (ref >59)
Glucose: 145 mg/dL — ABNORMAL HIGH (ref 65–99)
Potassium: 4.5 mmol/L (ref 3.5–5.2)
Sodium: 143 mmol/L (ref 134–144)

## 2019-12-04 LAB — POCT EXHALED NITRIC OXIDE: FeNO level (ppb): 17

## 2019-12-04 NOTE — Progress Notes (Signed)
PFT completed today.  

## 2019-12-09 ENCOUNTER — Ambulatory Visit
Admission: RE | Admit: 2019-12-09 | Discharge: 2019-12-09 | Disposition: A | Discharge status: Home / Self Care | Payer: Medicare Other | Source: Ambulatory Visit | Attending: General Surgery | Admitting: General Surgery

## 2019-12-09 DIAGNOSIS — C50911 Malignant neoplasm of unspecified site of right female breast: Secondary | ICD-10-CM | POA: Diagnosis not present

## 2019-12-09 DIAGNOSIS — Z1231 Encounter for screening mammogram for malignant neoplasm of breast: Secondary | ICD-10-CM | POA: Diagnosis not present

## 2019-12-16 ENCOUNTER — Other Ambulatory Visit: Payer: Self-pay | Admitting: Family Medicine

## 2019-12-16 DIAGNOSIS — Z853 Personal history of malignant neoplasm of breast: Secondary | ICD-10-CM | POA: Diagnosis not present

## 2019-12-16 MED ORDER — DICYCLOMINE HCL 10 MG PO CAPS: ORAL_CAPSULE | ORAL | 0 refills | Status: DC

## 2019-12-19 NOTE — Progress Notes (Signed)
Cardiology Office Note    Date:  12/30/2019   ID:  BATOUL LIMES, DOB 1945-07-02, MRN [id number]  PCP:  Jerrol Banana., MD  Cardiologist:  Nelva Bush, MD  Electrophysiologist:  None   Chief Complaint: Follow up  History of Present Illness:   Sandra [name] is a 74 y.o. female with history of HFpEF,right-sided breast cancer status post mastectomy without chemoradiation, celiac disease, Raynaud's disease, hypothyroidism, RLS, GERD, and anxiety who presents forfollow up of fatigue.  She was previously evaluated by Dr. Geoffery Lyons preoperative evaluation in the setting of right mastectomy in the summer 2018. She was found to have an abnormal EKG and cleared for surgery without additional testing. She indicated she had abnormal EKGs dating back to 2012 at which time she was evaluated by Dr. Ubaldo Glassing and underwent stress test which was normal per her report. She has subsequently established with Dr. [name] in 12/2016.Echo on 01/18/2017 showed an EF of 60 to 65%, mild concentric LVH with moderate hypertrophy of the septum with near cavity obliteration with systole with no significant outflow tract gradient measured, no regional wall motion abnormalities, grade 2 diastolic dysfunction, mild mitral regurgitation, mildly dilated left atrium, normal RV systolic function and RVSP. Treadmill MPI in 01/2017 showed a hypertensive response to exercise with no significant ischemia or infarct. LVEF was hyperdynamic at greater than 65%.  She was seen in the office in 11/2017 for follow-up of DOE and was doing well with stable symptoms. She noted improvement in her BP with the addition of carvedilol and amlodipine.  She was evaluated by her PCP in 07/2019 for follow-up of leg pain and swelling involving the right lower extremity felt to be related to her history of OA. It was felt she may need corticosteroid injection with possible aspiration of synovial cyst involving the right popliteal space. Lower  extremity venous ultrasound was negative for DVT. She was referred to orthopedicswho felt her symptoms were related to bursitis of the right hip and osteoarthritis of the right knee. She was seen by cardiology in 08/2019 with a several month history of exertional dyspnea while ambulating up an incline, weight gain, and right lower extremity swelling. Echo in 08/2019 showed an EF of 60-65%, no RWMA, mild LVH, Gr2DD, normal RVSF and ventricular cavity size, normal PASP, mildly dilated left atrium, mild to moderate mitral regurgitation. Coronary CTA in 09/2019 showed a calcium score of 9 which was 35th percentile for age and sex matched control with calcified plaque in the proximal LAD estimated at 0 to 24% with otherwise nonobstructive disease. Incidentally, there were bilateral indeterminate pulmonary nodules measuring up to 7 mm. She underwent follow-up chest CT 8 days later which redemonstrated multiple pulmonary nodules throughout the bilateral lungs with the largest measuring 7 mm in the right middle lobe with short-term follow-up versus tissue sampling recommended given the patient's history of breast cancer. She followed up with her primary cardiologist on 10/29/2019 and was doing relatively well. She had not had any further swelling of the right leg though continued to have some shortness of breath and cough. She denied any chest pain or palpitations.  She was started on low-dose Lasix with subsequent follow-up labs demonstrating stable renal function and potassium as outlined below.  There was discussion of performing CT abdomen pelvis to evaluate for pelvic thrombus versus extrinsic compression of an iliac vein given her prior history of right lower extremity swelling. However, she preferred to defer this at that time. She subsequently underwent this  imaging on 11/26/2019 which demonstrated no acute findings in the abdomen or pelvis, specifically no findings to explain the patient's history of abdominal  distention and bloating. Tiny bilateral pulmonary nodules are noted in the lung bases measuring up to 5 mm. Incidentally bilateral nonobstructing nephrolithiasis and aortic atherosclerosis was noted.  She was most recently seen in the office on 12/03/2019, noting improvement/resolution of the right lower extremity swelling since she was last seen without recurrence.  She did continue to note dyspnea on walking up an incline as well as dizziness when bending over.  Orthostatic vitals were normal.  She was transitioned to prn dosed Lasix and amlodipine was decreased secondary to relative hypotension.   She comes in today noting approximately 3 days after decreasing amlodipine and transitioning Lasix to as needed she has noted a significant improvement in her energy level and functional status.  She still does have some days in which she becomes fatigued and takes a nap though symptoms are significantly improved.  She has not needed any as needed Lasix.  No chest pain, worsening dyspnea, palpitations, lower extremity swelling outside of intermittent right knee soft tissue swelling, abdominal distention, orthopnea, PND, early satiety.   Labs independently reviewed: 11/2019 - BUN 12, SCr 0.73, potassium 4.5 10/2019 - albumin 4.1, AST normal, ALT 34, TSH normal, Hgb 11.7, PLT 238 06/2019-TC 234, TG 179, HDL 58, LDL 144  Past Medical History:  Diagnosis Date  . Abnormality of left ventricle of heart    MILD-MODERATE THICKENING PER ECHO ON 01-18-17  . Anemia   . Anxiety   . Arthritis   . BRCA gene mutation negative 10/2016   NEGATIVE: [employer]  . Breast cancer ([place]) 10/16/2016   T1c,N0;, GRADE I/III, 1.6cm. ER/PR pos  HER2 not over expressed, Right Upper Outer  . Celiac disease   . Depression   . Diastolic dysfunction   . Dyspnea    WITH EXERTION  . GERD (gastroesophageal reflux disease)    OCC  . Glaucoma    right eye  . Headache    H/O MIGRAINES AS TEENAGER  . Heart murmur    PT WAS TOLD ONCE  SHE HAD MVP AND THEN ANOTHER MD SAID SHE DID NOT HAVE MVP  . Hypertension   . Hypothyroidism   . Pneumonia    YEARS AGO  . Raynaud's disease   . RLS (restless legs syndrome)     Past Surgical History:  Procedure Laterality Date  . ABDOMINAL HYSTERECTOMY    . APPENDECTOMY    . AXILLARY LYMPH NODE BIOPSY Right 03/05/2017   Procedure: AXILLARY LYMPH NODE BIOPSY;  Surgeon: Robert Bellow, MD;  Location: [facility] ORS;  Service: General;  Laterality: Right;  . CATARACT EXTRACTION Bilateral   . CESAREAN SECTION    . COLONOSCOPY WITH PROPOFOL N/A 05/19/2015   Procedure: COLONOSCOPY WITH PROPOFOL;  Surgeon: Manya Silvas, MD;  [facility] ENDOSCOPY;  Service: Endoscopy;  Laterality: N/A;  . COLONOSCOPY WITH PROPOFOL N/A 08/01/2019   Procedure: COLONOSCOPY WITH PROPOFOL;  Surgeon: Lucilla Lame, MD;  Location: [facility] ENDOSCOPY;  Service: Endoscopy;  Laterality: N/A;  . EYE SURGERY    . HAND SURGERY Left   . MASTECTOMY Right 11/07/2016   RESIDUAL INVASIVE MAMMARY CARCINOMA, SUBAREOLAR ANTERIOR TO PREVIOUS   . SENTINEL NODE BIOPSY Right 11/07/2016   Procedure: SENTINEL NODE BIOPSY;  Surgeon: Robert Bellow, MD;  Location: [facility] ORS;  Service: General;  Laterality: Right;  . SIMPLE MASTECTOMY WITH AXILLARY SENTINEL NODE BIOPSY Right 11/07/2016  6 mm ER/PR 100%; Her 2 neu not overexpressed, T1b, N0.  Surgeon: Robert Bellow, MD;  Location: [facility] ORS;  Service: General;  Laterality: Right;  . TONSILLECTOMY  AGE 59  . UPPER GI ENDOSCOPY  01/12/06   hiatus hernia    Current Medications: Current Meds  Medication Sig  . ALPRAZolam (XANAX) 0.5 MG tablet Take 1 tablet (0.5 mg total) by mouth at bedtime.  [name] atorvastatin (LIPITOR) 40 MG tablet Take 1 tablet (40 mg total) by mouth daily.  . Calcium Carbonate (CALCIUM 600 PO) Take 600 mg by mouth daily at 3 pm.   . carvedilol (COREG) 3.125 MG tablet TAKE 1 TABLET BY MOUTH  TWICE DAILY  . Cholecalciferol (VITAMIN D3) 1000 units CAPS Take 1,000  Units by mouth.  . dicyclomine (BENTYL) 10 MG capsule TAKE 1 CAPSULE BY MOUTH 3  TIMES DAILY BEFORE MEALS  . furosemide (LASIX) 20 MG tablet Take 1 tablet (20 mg total) by mouth daily as needed for fluid or edema (sob).  . hydroxypropyl methylcellulose / hypromellose (ISOPTO TEARS / GONIOVISC) 2.5 % ophthalmic solution Place 1 drop into both eyes 4 (four) times daily as needed for dry eyes (scheduled each morning & then as needed).  . latanoprost (XALATAN) 0.005 % ophthalmic solution INT 1 GTT IN OD 1 TIME IN THE EVE  . letrozole (FEMARA) 2.5 MG tablet Take 1 tablet (2.5 mg total) by mouth daily.  [name] levothyroxine (SYNTHROID) 100 MCG tablet TAKE 1 TABLET BY MOUTH 30  MINUTES BEFORE BREAKFAST  . Multiple Vitamin (MULTIVITAMIN WITH MINERALS) TABS tablet Take 1 tablet by mouth daily at 3 pm.  . Omega-3 Fatty Acids (FISH OIL PO) Take 1 capsule by mouth daily.   [name] omeprazole (PRILOSEC) 40 MG capsule TAKE 1 CAPSULE BY MOUTH  DAILY  . sennosides-docusate sodium (SENOKOT-S) 8.6-50 MG tablet Take 1 tablet by mouth as needed for constipation.  . sertraline (ZOLOFT) 100 MG tablet TAKE 1 TABLET BY MOUTH  DAILY  . Simethicone (GAS-X PO) Take 1 tablet by mouth as needed.  . [DISCONTINUED] amLODipine (NORVASC) 2.5 MG tablet Take 1 tablet (2.5 mg total) by mouth daily.  . [DISCONTINUED] atorvastatin (LIPITOR) 40 MG tablet Take 1 tablet (40 mg total) by mouth daily.    Allergies:   Patient has no known allergies.   Social History   Socioeconomic History  . Marital status: Married    Spouse name: Not on file  . Number of children: 2  . Years of education: Not on file  . Highest education level: Some college, no degree  Occupational History  . Occupation: retired  Tobacco Use  . Smoking status: Never Smoker  . Smokeless tobacco: Never Used  Vaping Use  . Vaping Use: Never used  Substance and Sexual Activity  . Alcohol use: Yes    Comment: 0-2 glasses of wine a month  . Drug use: No  . Sexual  activity: Not Currently    Birth control/protection: None  Other Topics Concern  . Not on file  Social History Narrative  . Not on file   Social Determinants of Health   Financial Resource Strain:   . Difficulty of Paying Living Expenses:   Food Insecurity:   . Worried About Charity fundraiser in the Last Year:   . Arboriculturist in the Last Year:   Transportation Needs:   . Film/video editor (Medical):   [name] Lack of Transportation (Non-Medical):   Physical Activity:   . Days  of Exercise per Week:   . Minutes of Exercise per Session:   Stress: No Stress Concern Present  . Feeling of Stress : Not at all  Social Connections:   . Frequency of Communication with Friends and Family:   . Frequency of Social Gatherings with Friends and Family:   . Attends Religious Services:   . Active Member of Clubs or Organizations:   . Attends Archivist Meetings:   [name] Marital Status:      Family History:  The patient's family history includes Alcohol abuse in her father; Anemia in her mother; Breast cancer in her sister; Breast cancer (age of onset: 41) in her maternal grandmother; CAD in her paternal grandfather; Cancer in her mother and sister; Heart attack in her maternal grandfather; Other in her mother; Prostate cancer in her father; Stroke in her maternal grandmother.  ROS:   Review of Systems  Constitutional: Positive for malaise/fatigue. Negative for chills, diaphoresis, fever and weight loss.  HENT: Negative for congestion.   Eyes: Negative for discharge and redness.  Respiratory: Negative for cough, sputum production, shortness of breath and wheezing.   Cardiovascular: Negative for chest pain, palpitations, orthopnea, claudication, leg swelling and PND.  Gastrointestinal: Negative for abdominal pain, heartburn, nausea and vomiting.  Musculoskeletal: Negative for falls and myalgias.  Skin: Negative for rash.  Neurological: Negative for dizziness, tingling, tremors,  sensory change, speech change, focal weakness, loss of consciousness and weakness.  Endo/Heme/Allergies: Does not bruise/bleed easily.  Psychiatric/Behavioral: Negative for substance abuse. The patient is not nervous/anxious.   All other systems reviewed and are negative.    EKGs/Labs/Other Studies Reviewed:    Studies reviewed were summarized above. The additional studies were reviewed today:  2D echo 08/2019: 1. Left ventricular ejection fraction, by estimation, is 60 to 65%. The  left ventricle has normal function. The left ventricle has no regional  wall motion abnormalities. There is mild left ventricular hypertrophy.  Left ventricular diastolic parameters  are consistent with Grade II diastolic dysfunction (pseudonormalization).  2. Right ventricular systolic function is normal. The right ventricular  size is normal. There is normal pulmonary artery systolic pressure.  3. Left atrial size was mildly dilated.  4. Mild to moderate mitral valve regurgitation. __________  Coronary CTA 09/2014: IMPRESSION: 1. Coronary calcium score of 9.29. This was 35th percentile for age and sex matched control.  2. Normal coronary origin with right dominance.  3. Calcified plaque in the proximal LAD minimal non obstructive disease  4. CAD-RADS 1. Minimal non-obstructive CAD (0-24%). Consider non-atherosclerotic causes of chest pain. Consider preventive therapy and risk factor modification.  IMPRESSION: Bilateral indeterminate pulmonary nodules measuring up to 7 mm. Pulmonary metastases cannot be excluded in this patient with history of breast carcinoma. Complete chest CT without contrast is recommended further evaluation.   EKG:  EKG is ordered today.  The EKG ordered today demonstrates NSR, 60 bpm, LVH with early repolarization abnormality, anterolateral T wave inversion unchanged from prior  Recent Labs: 11/24/2019: ALT 34; Hemoglobin 11.7; Platelets 238; TSH 2.300 12/03/2019:  BUN 12; Creatinine, Ser 0.73; Potassium 4.5; Sodium 143  Recent Lipid Panel    Component Value Date/Time   CHOL 234 (H) 06/30/2019 1145   TRIG 179 (H) 06/30/2019 1145   HDL 58 06/30/2019 1145   CHOLHDL 4.0 06/30/2019 1145   LDLCALC 144 (H) 06/30/2019 1145    PHYSICAL EXAM:    VS:  BP 118/62 (BP Location: Left Arm, Patient Position: Sitting, Cuff Size: Normal)   Pulse  60   Ht 5' 3"  (1.6 m)   Wt 134 lb 9.6 oz (61.1 kg)   SpO2 98%   BMI 23.84 kg/m   BMI: Body mass index is 23.84 kg/m.  Physical Exam Constitutional:      Appearance: She is well-developed.  HENT:     Head: Normocephalic and atraumatic.  Eyes:     General:        Right eye: No discharge.        Left eye: No discharge.  Neck:     Vascular: No JVD.  Cardiovascular:     Rate and Rhythm: Normal rate and regular rhythm.     Pulses: No midsystolic click and no opening snap.          Posterior tibial pulses are 2+ on the right side and 2+ on the left side.     Heart sounds: S1 normal and S2 normal. Heart sounds not distant. Murmur heard. High-pitched blowing holosystolic murmur is present with a grade of 2/6 at the apex.  No friction rub.  Pulmonary:     Effort: Pulmonary effort is normal. No respiratory distress.     Breath sounds: Normal breath sounds. No decreased breath sounds, wheezing or rales.  Chest:     Chest wall: No tenderness.  Abdominal:     General: There is no distension.     Palpations: Abdomen is soft.     Tenderness: There is no abdominal tenderness.  Musculoskeletal:     Cervical back: Normal range of motion.  Skin:    General: Skin is warm and dry.     Nails: There is no clubbing.  Neurological:     Mental Status: She is alert and oriented to person, place, and time.  Psychiatric:        Speech: Speech normal.        Behavior: Behavior normal.        Thought Content: Thought content normal.        Judgment: Judgment normal.     Wt Readings from Last 3 Encounters:  12/30/19 134  lb 9.6 oz (61.1 kg)  12/03/19 135 lb 4 oz (61.3 kg)  11/24/19 135 lb (61.2 kg)     ASSESSMENT & PLAN:   1. Nonobstructive CAD: Coronary CTA with a calcium score of 9 with minimal nonobstructive disease noted within the LAD.  Continue current medical therapy including atorvastatin and carvedilol.  No plans for further ischemic evaluation at this time.  2. HFpEF/right lower extremity swelling/dyspnea/dizziness: She appears euvolemic and well compensated.  Symptoms have significantly improved following decrease dose of amlodipine and transitioning of Lasix to as needed dosing.  She has not needed any as needed dose of Lasix.  3. Mitral regurgitation: Mild to moderate on most recent echo.  Plan for updated echo within the next 12 months.  4. Pulmonary nodules: Managed by pulmonology.  Repeat CT imaging scheduled for later this month.  5. HTN: Blood pressure is well controlled and slightly higher following tapering of amlodipine and transitioning of Lasix to as needed dosing.  Trial of discontinuing amlodipine 2.5 mg altogether.  Continue carvedilol and as needed Lasix.  She will contact us in 2 weeks time if BP is running high.  6. HLD: LDL 144.  She remains on atorvastatin 40 mg daily and is tolerating this well.  With mild coronary artery calcification and aortic atherosclerosis noted on coronary CTA, goal LDL is less than 70.  Recheck fasting lipid panel and liver function at  next visit with recommendation to escalate statin therapy as indicated.  Disposition: F/u with Dr. [name] or an APP in 6 months, sooner if needed.   Medication Adjustments/Labs and Tests Ordered: Current medicines are reviewed at length with the patient today.  Concerns regarding medicines are outlined above. Medication changes, Labs and Tests ordered today are summarized above and listed in the Patient Instructions accessible in Encounters.   Signed, Christell Faith, PA-C 12/30/2019 4:07 PM     [place] [address] [place] [address], [place] [address] [phone]

## 2019-12-19 NOTE — Progress Notes (Deleted)
     Established patient visit   Patient: Sandra [name]   DOB: April 17, 1946   74 y.o. Female  MRN: [id number] Visit Date: 12/24/2019  Today's healthcare provider: [name], MD   No chief complaint on file.  Subjective    HPI  Abdominal discomfort From 11/24/2019-She is on omeprazole.  Obtain baseline lab work.  She recently had abdominal and pelvic CT scan. Scan did reveal bilateral nonobstructing nephrolithiasis.  Generalized anxiety disorder From 11/24/2019-Ongoing issue for this patient.  {Show patient history (optional):23778::" "}   Medications: Outpatient Medications Prior to Visit  Medication Sig  . ALPRAZolam (XANAX) 0.5 MG tablet Take 1 tablet (0.5 mg total) by mouth at bedtime.  [name] amLODipine (NORVASC) 2.5 MG tablet Take 1 tablet (2.5 mg total) by mouth daily.  [name] atorvastatin (LIPITOR) 40 MG tablet Take 1 tablet (40 mg total) by mouth daily.  . Calcium Carbonate (CALCIUM 600 PO) Take 600 mg by mouth daily at 3 pm.   . carvedilol (COREG) 3.125 MG tablet TAKE 1 TABLET BY MOUTH  TWICE DAILY  . Cholecalciferol (VITAMIN D3) 1000 units CAPS Take 1,000 Units by mouth.  . dicyclomine (BENTYL) 10 MG capsule TAKE 1 CAPSULE BY MOUTH 3  TIMES DAILY BEFORE MEALS  . furosemide (LASIX) 20 MG tablet Take 1 tablet (20 mg total) by mouth daily as needed for fluid or edema (sob).  . hydroxypropyl methylcellulose / hypromellose (ISOPTO TEARS / GONIOVISC) 2.5 % ophthalmic solution Place 1 drop into both eyes 4 (four) times daily as needed for dry eyes (scheduled each morning & then as needed).  . latanoprost (XALATAN) 0.005 % ophthalmic solution INT 1 GTT IN OD 1 TIME IN THE EVE  . letrozole (FEMARA) 2.5 MG tablet Take 1 tablet (2.5 mg total) by mouth daily.  [name] levothyroxine (SYNTHROID) 100 MCG tablet TAKE 1 TABLET BY MOUTH 30  MINUTES BEFORE BREAKFAST  . Multiple Vitamin (MULTIVITAMIN WITH MINERALS) TABS tablet Take 1 tablet by mouth daily at 3 pm.  . Omega-3 Fatty Acids (FISH OIL  PO) Take 1 capsule by mouth daily.   [name] omeprazole (PRILOSEC) 40 MG capsule TAKE 1 CAPSULE BY MOUTH  DAILY  . sennosides-docusate sodium (SENOKOT-S) 8.6-50 MG tablet Take 1 tablet by mouth as needed for constipation.  . sertraline (ZOLOFT) 100 MG tablet TAKE 1 TABLET BY MOUTH  DAILY  . Simethicone (GAS-X PO) Take 1 tablet by mouth as needed.   No facility-administered medications prior to visit.    Review of Systems  Constitutional: Negative for appetite change, chills, fatigue and fever.  Respiratory: Negative for chest tightness and shortness of breath.   Cardiovascular: Negative for chest pain and palpitations.  Gastrointestinal: Negative for abdominal pain, nausea and vomiting.  Neurological: Negative for dizziness and weakness.    {Heme  Chem  Endocrine  Serology  Results Review (optional):23779::" "}  Objective    There were no vitals taken for this visit. {Show previous vital signs (optional):23777::" "}  Physical Exam  ***  No results found for any visits on 12/24/19.  Assessment & Plan     ***  No follow-ups on file.      {provider attestation***:1}   [name], MD  [facility] [phone] (phone) [phone] (fax)  [place]

## 2019-12-24 ENCOUNTER — Ambulatory Visit: Payer: Medicare Other | Admitting: Family Medicine

## 2019-12-24 DIAGNOSIS — I10 Essential (primary) hypertension: Secondary | ICD-10-CM

## 2019-12-24 DIAGNOSIS — J449 Chronic obstructive pulmonary disease, unspecified: Secondary | ICD-10-CM

## 2019-12-24 DIAGNOSIS — E038 Other specified hypothyroidism: Secondary | ICD-10-CM

## 2019-12-24 DIAGNOSIS — M810 Age-related osteoporosis without current pathological fracture: Secondary | ICD-10-CM

## 2019-12-24 DIAGNOSIS — E7849 Other hyperlipidemia: Secondary | ICD-10-CM

## 2019-12-26 ENCOUNTER — Other Ambulatory Visit: Payer: Self-pay | Admitting: Family Medicine

## 2019-12-30 ENCOUNTER — Ambulatory Visit: Payer: Medicare Other | Admitting: Physician Assistant

## 2019-12-30 ENCOUNTER — Encounter: Payer: Self-pay | Admitting: Physician Assistant

## 2019-12-30 ENCOUNTER — Other Ambulatory Visit: Payer: Self-pay

## 2019-12-30 VITALS — BP 118/62 | HR 60 | Ht 63.0 in | Wt 134.6 lb

## 2019-12-30 DIAGNOSIS — I5032 Chronic diastolic (congestive) heart failure: Secondary | ICD-10-CM

## 2019-12-30 DIAGNOSIS — E785 Hyperlipidemia, unspecified: Secondary | ICD-10-CM

## 2019-12-30 DIAGNOSIS — I10 Essential (primary) hypertension: Secondary | ICD-10-CM | POA: Diagnosis not present

## 2019-12-30 DIAGNOSIS — R918 Other nonspecific abnormal finding of lung field: Secondary | ICD-10-CM

## 2019-12-30 DIAGNOSIS — I2510 Atherosclerotic heart disease of native coronary artery without angina pectoris: Secondary | ICD-10-CM | POA: Diagnosis not present

## 2019-12-30 DIAGNOSIS — I340 Nonrheumatic mitral (valve) insufficiency: Secondary | ICD-10-CM

## 2019-12-30 MED ORDER — ATORVASTATIN CALCIUM 40 MG PO TABS: 40.0000 mg | ORAL_TABLET | Freq: Every day | ORAL | 3 refills | Status: DC

## 2019-12-30 NOTE — Patient Instructions (Addendum)
Medication Instructions:  - Your physician has recommended you make the following change in your medication:   1) Stop amlodipine  2) Your lipitor (atorvastatin) has been sent to [facility]  *If you need a refill on your cardiac medications before your next appointment, please call your pharmacy*   Lab Work: - none ordered  If you have labs (blood work) drawn today and your tests are completely normal, you will receive your results only by: [name] MyChart Message (if you have MyChart) OR . A paper copy in the mail If you have any lab test that is abnormal or we need to change your treatment, we will call you to review the results.   Testing/Procedures: - none ordered   Follow-Up: At [facility], you and your health needs are our priority.  As part of our continuing mission to provide you with exceptional heart care, we have created designated Provider Care Teams.  These Care Teams include your primary Cardiologist (physician) and Advanced Practice Providers (APPs -  Physician Assistants and Nurse Practitioners) who all work together to provide you with the care you need, when you need it.  We recommend signing up for the patient portal called "MyChart".  Sign up information is provided on this After Visit Summary.  MyChart is used to connect with patients for Virtual Visits (Telemedicine).  Patients are able to view lab/test results, encounter notes, upcoming appointments, etc.  Non-urgent messages can be sent to your provider as well.   To learn more about what you can do with MyChart, go to [email or web address].    Your next appointment:   6 month(s)  The format for your next appointment:   In Person  Provider:    You may see Nelva Bush, MD or one of the following Advanced Practice Providers on your designated Care Team:    Murray Hodgkins, NP  Christell Faith, PA-C  Marrianne Mood, PA-C    Other Instructions n/a

## 2019-12-31 ENCOUNTER — Ambulatory Visit (INDEPENDENT_AMBULATORY_CARE_PROVIDER_SITE_OTHER): Payer: Medicare Other | Admitting: Family Medicine

## 2019-12-31 VITALS — BP 130/62 | HR 52 | Temp 98.2°F | Wt 136.0 lb

## 2019-12-31 DIAGNOSIS — I10 Essential (primary) hypertension: Secondary | ICD-10-CM

## 2019-12-31 DIAGNOSIS — K900 Celiac disease: Secondary | ICD-10-CM

## 2019-12-31 DIAGNOSIS — C50911 Malignant neoplasm of unspecified site of right female breast: Secondary | ICD-10-CM

## 2019-12-31 DIAGNOSIS — E559 Vitamin D deficiency, unspecified: Secondary | ICD-10-CM | POA: Diagnosis not present

## 2019-12-31 DIAGNOSIS — R918 Other nonspecific abnormal finding of lung field: Secondary | ICD-10-CM

## 2019-12-31 NOTE — Progress Notes (Deleted)
Established patient visit   Patient: Sandra [name]   DOB: 01-16-1946   74 y.o. Female  MRN: [id number] Visit Date: 12/31/2019  Today's healthcare provider: [name], MD   Chief Complaint  Patient presents with  . Abdominal Pain   Subjective     Abdominal Pain Associated symptoms include constipation (on and off) and headaches. Pertinent negatives include no diarrhea, dysuria, nausea or vomiting.    Sandra [name] was previously diagnosed with celiac disease. She has not followed a gluten free diet. She is having some abdominal pain and abdominal weight gain, which is expanding. Sandra [name] believes this may be related to her celiac disease and has considered starting a gluten free diet, but loves foods, especially bread and pasta, and has been unsatisfied with gluten free food and the cost.  Abdominal Pain Patient is a 74 year old female who presents for follow up of abdominal pain.  She was last seen on 11/24/19  Previous labs Lab Results  Component Value Date   WBC 5.5 11/24/2019   HGB 11.7 11/24/2019   HCT 35.4 11/24/2019   MCV 89 11/24/2019   MCH 29.3 11/24/2019   RDW 13.1 11/24/2019   PLT 238 11/24/2019   Lab Results  Component Value Date   GLUCOSE 145 (H) 12/03/2019   NA 143 12/03/2019   K 4.5 12/03/2019   CL 107 (H) 12/03/2019   CO2 26 12/03/2019   BUN 12 12/03/2019   CREATININE 0.73 12/03/2019   GFRNONAA 81 12/03/2019   GFRAA 94 12/03/2019   CALCIUM 8.9 12/03/2019   PROT 6.5 11/24/2019   ALBUMIN 4.1 11/24/2019   LABGLOB 2.4 11/24/2019   AGRATIO 1.7 11/24/2019   BILITOT 0.6 11/24/2019   ALKPHOS 91 11/24/2019   AST 25 11/24/2019   ALT 34 (H) 11/24/2019   ANIONGAP 8 09/29/2019   No results found for: AMYLASE -----------------------------------------------------------------------------------------    Medications: Outpatient Medications Prior to Visit  Medication Sig  . ALPRAZolam (XANAX) 0.5 MG tablet Take 1 tablet (0.5 mg total) by mouth  at bedtime.  [name] atorvastatin (LIPITOR) 40 MG tablet Take 1 tablet (40 mg total) by mouth daily.  . Calcium Carbonate (CALCIUM 600 PO) Take 600 mg by mouth daily at 3 pm.   . carvedilol (COREG) 3.125 MG tablet TAKE 1 TABLET BY MOUTH  TWICE DAILY  . Cholecalciferol (VITAMIN D3) 1000 units CAPS Take 1,000 Units by mouth.  . dicyclomine (BENTYL) 10 MG capsule TAKE 1 CAPSULE BY MOUTH 3  TIMES DAILY BEFORE MEALS  . furosemide (LASIX) 20 MG tablet Take 1 tablet (20 mg total) by mouth daily as needed for fluid or edema (sob).  . hydroxypropyl methylcellulose / hypromellose (ISOPTO TEARS / GONIOVISC) 2.5 % ophthalmic solution Place 1 drop into both eyes 4 (four) times daily as needed for dry eyes (scheduled each morning & then as needed).  . latanoprost (XALATAN) 0.005 % ophthalmic solution INT 1 GTT IN OD 1 TIME IN THE EVE  . letrozole (FEMARA) 2.5 MG tablet Take 1 tablet (2.5 mg total) by mouth daily.  [name] levothyroxine (SYNTHROID) 100 MCG tablet TAKE 1 TABLET BY MOUTH 30  MINUTES BEFORE BREAKFAST  . Multiple Vitamin (MULTIVITAMIN WITH MINERALS) TABS tablet Take 1 tablet by mouth daily at 3 pm.  . Omega-3 Fatty Acids (FISH OIL PO) Take 1 capsule by mouth daily.   [name] omeprazole (PRILOSEC) 40 MG capsule TAKE 1 CAPSULE BY MOUTH  DAILY  . sennosides-docusate sodium (SENOKOT-S) 8.6-50 MG tablet  Take 1 tablet by mouth as needed for constipation.  . sertraline (ZOLOFT) 100 MG tablet TAKE 1 TABLET BY MOUTH  DAILY  . Simethicone (GAS-X PO) Take 1 tablet by mouth as needed.   No facility-administered medications prior to visit.    Review of Systems  Respiratory: Positive for shortness of breath.   Gastrointestinal: Positive for abdominal distention, abdominal pain and constipation (on and off). Negative for anal bleeding, blood in stool, diarrhea, nausea, rectal pain and vomiting.  Genitourinary: Negative for dysuria.  Neurological: Positive for headaches.      Objective    BP 130/62 (BP Location: Right  Arm, Patient Position: Sitting, Cuff Size: Normal)   Pulse (!) 52   Temp 98.2 F (36.8 C) (Oral)   Wt 136 lb (61.7 kg)   SpO2 97%   BMI 24.09 kg/m    Physical Exam Constitutional:      General: She is not in acute distress.    Appearance: She is well-developed and normal weight. She is not toxic-appearing or diaphoretic.  HENT:     Head: Normocephalic and atraumatic.  Cardiovascular:     Rate and Rhythm: Normal rate and regular rhythm.     Heart sounds: Murmur heard.  Systolic murmur is present.   Pulmonary:     Effort: Pulmonary effort is normal. No respiratory distress.     Breath sounds: Normal breath sounds. No stridor. No wheezing.  Abdominal:     General: There is distension.     Palpations: Abdomen is soft. There is no mass or pulsatile mass.     Tenderness: There is abdominal tenderness in the right lower quadrant and left lower quadrant.  Skin:    General: Skin is warm and dry.     Coloration: Skin is not jaundiced.  Neurological:     General: No focal deficit present.     Mental Status: She is alert and oriented to person, place, and time.  Psychiatric:        Mood and Affect: Mood normal. Mood is not depressed.        Behavior: Behavior normal.     No results found for any visits on 12/31/19.  Assessment & Plan    1. Celiac disease - Try gluten free diet through Halloween - F/U in early November  2. Chronic obstructive pulmonary disease, unspecified COPD type ([place])  3. Other hyperlipidemia  4. Other specified hypothyroidism  5. Essential hypertension - Amlodipine stopped by cardiologist on 8/3   6. Osteoporosis, unspecified osteoporosis type, unspecified pathological fracture presence  7. Generalized anxiety disorder Ongoing issue for this patient  8. Invasive ductal carcinoma of breast, female, right ([place]) Clinically followed by oncology and breast surgery  9. Lung nodules Followed by pulmonology, general surgery   No follow-ups on file.       Rodrigo Ran, MS3  Richard Cranford Mon, MD  [facility] [phone] (phone) [phone] (fax)  [place]

## 2019-12-31 NOTE — Progress Notes (Signed)
Established patient visit   Patient: Sandra [name]   DOB: 1945/12/09   74 y.o. Female  MRN: [id number] Visit Date: 12/31/2019  Today's healthcare provider: [name], MD   Chief Complaint  Patient presents with  . Abdominal Pain   Subjective    HPI   Abdominal Pain Patient is a 74 year old female who presents for follow up of abdominal pain.  She was last seen on 11/24/19. She is feeling better.  The pain was stopped on 8 3 by cardiology.  She has had nonspecific abdominal pain off and on.  No melena nausea vomiting or fevers.  Urine was normal.    Previous labs Lab Results  Component Value Date   WBC 5.5 11/24/2019   HGB 11.7 11/24/2019   HCT 35.4 11/24/2019   MCV 89 11/24/2019   MCH 29.3 11/24/2019   RDW 13.1 11/24/2019   PLT 238 11/24/2019   Lab Results  Component Value Date   GLUCOSE 145 (H) 12/03/2019   NA 143 12/03/2019   K 4.5 12/03/2019   CL 107 (H) 12/03/2019   CO2 26 12/03/2019   BUN 12 12/03/2019   CREATININE 0.73 12/03/2019   GFRNONAA 81 12/03/2019   GFRAA 94 12/03/2019   CALCIUM 8.9 12/03/2019   PROT 6.5 11/24/2019   ALBUMIN 4.1 11/24/2019   LABGLOB 2.4 11/24/2019   AGRATIO 1.7 11/24/2019   BILITOT 0.6 11/24/2019   ALKPHOS 91 11/24/2019   AST 25 11/24/2019   ALT 34 (H) 11/24/2019   ANIONGAP 8 09/29/2019   No results found for: AMYLASE -----------------------------------------------------------------------------------------    Medications: Outpatient Medications Prior to Visit  Medication Sig  . ALPRAZolam (XANAX) 0.5 MG tablet Take 1 tablet (0.5 mg total) by mouth at bedtime.  [name] atorvastatin (LIPITOR) 40 MG tablet Take 1 tablet (40 mg total) by mouth daily.  . Calcium Carbonate (CALCIUM 600 PO) Take 600 mg by mouth daily at 3 pm.   . carvedilol (COREG) 3.125 MG tablet TAKE 1 TABLET BY MOUTH  TWICE DAILY  . Cholecalciferol (VITAMIN D3) 1000 units CAPS Take 1,000 Units by mouth.  . dicyclomine (BENTYL) 10 MG capsule TAKE 1  CAPSULE BY MOUTH 3  TIMES DAILY BEFORE MEALS  . furosemide (LASIX) 20 MG tablet Take 1 tablet (20 mg total) by mouth daily as needed for fluid or edema (sob).  . hydroxypropyl methylcellulose / hypromellose (ISOPTO TEARS / GONIOVISC) 2.5 % ophthalmic solution Place 1 drop into both eyes 4 (four) times daily as needed for dry eyes (scheduled each morning & then as needed).  . latanoprost (XALATAN) 0.005 % ophthalmic solution INT 1 GTT IN OD 1 TIME IN THE EVE  . letrozole (FEMARA) 2.5 MG tablet Take 1 tablet (2.5 mg total) by mouth daily.  [name] levothyroxine (SYNTHROID) 100 MCG tablet TAKE 1 TABLET BY MOUTH 30  MINUTES BEFORE BREAKFAST  . Multiple Vitamin (MULTIVITAMIN WITH MINERALS) TABS tablet Take 1 tablet by mouth daily at 3 pm.  . Omega-3 Fatty Acids (FISH OIL PO) Take 1 capsule by mouth daily.   [name] omeprazole (PRILOSEC) 40 MG capsule TAKE 1 CAPSULE BY MOUTH  DAILY  . sennosides-docusate sodium (SENOKOT-S) 8.6-50 MG tablet Take 1 tablet by mouth as needed for constipation.  . sertraline (ZOLOFT) 100 MG tablet TAKE 1 TABLET BY MOUTH  DAILY  . Simethicone (GAS-X PO) Take 1 tablet by mouth as needed.   No facility-administered medications prior to visit.    Review of Systems  Gastrointestinal:  Positive for abdominal pain (on and off) and constipation (on and off). Negative for anal bleeding, blood in stool, diarrhea, nausea, rectal pain and vomiting.  Genitourinary: Negative for dysuria.       Objective    BP 130/62 (BP Location: Right Arm, Patient Position: Sitting, Cuff Size: Normal)   Pulse (!) 52   Temp 98.2 F (36.8 C) (Oral)   Wt 136 lb (61.7 kg)   SpO2 97%   BMI 24.09 kg/m  Wt Readings from Last 3 Encounters:  12/31/19 136 lb (61.7 kg)  12/30/19 134 lb 9.6 oz (61.1 kg)  12/03/19 135 lb 4 oz (61.3 kg)      Physical Exam Vitals reviewed.  Constitutional:      Appearance: Normal appearance. She is normal weight.  HENT:     Head: Normocephalic.     Right Ear: External ear  normal.     Left Ear: External ear normal.     Nose: Nose normal.     Mouth/Throat:     Pharynx: Oropharynx is clear.  Eyes:     General: No scleral icterus.    Conjunctiva/sclera: Conjunctivae normal.  Cardiovascular:     Rate and Rhythm: Normal rate and regular rhythm.     Pulses: Normal pulses.     Heart sounds: Normal heart sounds.  Pulmonary:     Breath sounds: Normal breath sounds.  Abdominal:     Palpations: Abdomen is soft.  Skin:    General: Skin is warm and dry.  Neurological:     General: No focal deficit present.     Mental Status: She is alert and oriented to person, place, and time. Mental status is at baseline.  Psychiatric:        Mood and Affect: Mood normal.        Behavior: Behavior normal.        Thought Content: Thought content normal.        Judgment: Judgment normal.       No results found for any visits on 12/31/19.  Assessment & Plan      1. Celiac disease This is likely the source of patient's abdominal pain.  Strongly advised to try gluten-free.  Follow-up this fall.  2. Essential hypertension Good control.  3. Vitamin D deficiency   4. Invasive ductal carcinoma of breast, female, right ([place])   5. Pulmonary nodules/lesions, multiple Follow-up with pulmonary.  No follow-ups on file.         Gustie Bobb Cranford Mon, MD  [facility] [phone] (phone) [phone] (fax)  [place]

## 2020-01-01 NOTE — Progress Notes (Signed)
Feno normal. Pls ensure folowup based on recent ov

## 2020-01-11 ENCOUNTER — Telehealth: Payer: Self-pay | Admitting: Internal Medicine

## 2020-01-11 NOTE — Telephone Encounter (Signed)
Margie  Blood allergy test near [place] but for some mild positivity to grass of 2 types. She is having CT chest 8/16. Pls ensure followup first avail 15 min slot at [facility] after CT  Thanks  MR

## 2020-01-12 ENCOUNTER — Ambulatory Visit
Admission: RE | Admit: 2020-01-12 | Discharge: 2020-01-12 | Disposition: A | Discharge status: Home / Self Care | Payer: Medicare Other | Source: Ambulatory Visit | Attending: Internal Medicine | Admitting: Internal Medicine

## 2020-01-12 ENCOUNTER — Other Ambulatory Visit: Payer: Self-pay

## 2020-01-12 DIAGNOSIS — I2510 Atherosclerotic heart disease of native coronary artery without angina pectoris: Secondary | ICD-10-CM | POA: Diagnosis not present

## 2020-01-12 DIAGNOSIS — K449 Diaphragmatic hernia without obstruction or gangrene: Secondary | ICD-10-CM | POA: Diagnosis not present

## 2020-01-12 DIAGNOSIS — I700 Atherosclerosis of aorta: Secondary | ICD-10-CM | POA: Diagnosis not present

## 2020-01-12 DIAGNOSIS — M47814 Spondylosis without myelopathy or radiculopathy, thoracic region: Secondary | ICD-10-CM | POA: Diagnosis not present

## 2020-01-12 DIAGNOSIS — R0602 Shortness of breath: Secondary | ICD-10-CM | POA: Diagnosis not present

## 2020-01-12 NOTE — Telephone Encounter (Signed)
Typically yes but sometimes they needs 48-72h. You can try for a tele visit late tomorrow or you can put for Wednesday 8/18 post lunch a 2pm. Just clear it with the office authorities if needed

## 2020-01-12 NOTE — Telephone Encounter (Signed)
Patient is aware of results and voiced her understanding.  Dr. Birdena Jubilee, you do not have any availability in [place]. Will a phone visit at [facility] office be okay?

## 2020-01-12 NOTE — Telephone Encounter (Signed)
Patient is having CT today. There is some ILD slots available for tomorrow in [place].   Dr. Birdena Jubilee, will CT be read within 24hr?

## 2020-01-12 NOTE — Telephone Encounter (Signed)
[place] telephone visit when I am in [place] or [place] is fine

## 2020-01-12 NOTE — Telephone Encounter (Signed)
Patient is aware of results and voiced her understanding. Patient has additional questions regarding CT. How is it compared to last CT?  MR are you okay with doing whole televisit yourself, as there will be no staff to do so?

## 2020-01-12 NOTE — Telephone Encounter (Signed)
Lauren, please advise if it would work with staffing to schedule a phone visit at 2:00 at 01/14/2020. Thanks

## 2020-01-12 NOTE — Telephone Encounter (Signed)
  Margie: CT has no ILD. THere is nothing urgent on this. So if you cannot do tele visit this week - just first avail is fine next few to several weeks. But if you want to do Wednesday early afternoon I Can     IMPRESSION: 1. No evidence of interstitial lung disease. 2. Prominent patchy air trapping throughout both lungs, indicative of small airways disease. 3. Three month stability of scattered solid pulmonary nodules, reassuring. Suggest continued noncontrast chest CT follow-up in 6 months in this patient with a history of malignancy. 4. Three-vessel coronary atherosclerosis. 5. Tiny hiatal hernia. 6. Mild diffuse hepatic steatosis. 7. Aortic Atherosclerosis (ICD10-I70.0).   Electronically Signed   By: Janina Mayo.D.

## 2020-01-13 NOTE — Telephone Encounter (Signed)
Yes this is fine.

## 2020-01-13 NOTE — Telephone Encounter (Signed)
Pt has been scheduled for phone visit with MR on 02/05/2020 at 9:15. Patient is agreeable with this visit.  Nothing further is needed at this time.

## 2020-01-13 NOTE — Telephone Encounter (Signed)
I can do the tele visit byt self but now I Cannot do this Wednesday afternoon because things have come up. When is the earliest you can schedule? I can explain answers like comparison etc., during tele visit

## 2020-01-13 NOTE — Telephone Encounter (Signed)
Pt returning missed call. Attempted to sched on 02/05/20 but sched was full. Sched pt for 02/12/20 in [place]. Should this be a televisit? Pt had concerns about how far away the appt was. Please advise.

## 2020-01-13 NOTE — Telephone Encounter (Signed)
02/05/2020 but it's ILD clinic. Would this be okay?

## 2020-01-13 NOTE — Telephone Encounter (Signed)
Lm for patient to schedule appt with Dr. Chase Caller on 02/05/2020.

## 2020-01-25 ENCOUNTER — Other Ambulatory Visit: Payer: Self-pay | Admitting: Internal Medicine

## 2020-02-01 ENCOUNTER — Other Ambulatory Visit: Payer: Self-pay | Admitting: Family Medicine

## 2020-02-05 ENCOUNTER — Ambulatory Visit (INDEPENDENT_AMBULATORY_CARE_PROVIDER_SITE_OTHER): Payer: Medicare Other | Admitting: Internal Medicine

## 2020-02-05 ENCOUNTER — Other Ambulatory Visit: Payer: Self-pay

## 2020-02-05 DIAGNOSIS — R053 Chronic cough: Secondary | ICD-10-CM

## 2020-02-05 DIAGNOSIS — R911 Solitary pulmonary nodule: Secondary | ICD-10-CM | POA: Diagnosis not present

## 2020-02-05 DIAGNOSIS — R0600 Dyspnea, unspecified: Secondary | ICD-10-CM

## 2020-02-05 DIAGNOSIS — R918 Other nonspecific abnormal finding of lung field: Secondary | ICD-10-CM

## 2020-02-05 DIAGNOSIS — R0609 Other forms of dyspnea: Secondary | ICD-10-CM

## 2020-02-05 DIAGNOSIS — R05 Cough: Secondary | ICD-10-CM

## 2020-02-05 NOTE — Patient Instructions (Addendum)
Dyspnea on exertion Chronic cough Pulmonary nodules/lesions, multiple History Seasonal Allergy  -Shortness of breath: is definitely due to stiff heart muscle with leaky mitral valve +/-  asthma    -Cough: This could be asthma or could be related to the nodules   -Multiple lung nodules:   Stable x 3 months as of Aug 2021. I do not think the nodules are related to breast cancer.Blood TB test is negative  Plan -  Repeat CT scan of the chest in 6 months - Hold off starting empiric asthma inhaler treatment as per our discussion  -Discussed the stiff heart muscle and leaky mitral valve with your cardiologist Dr. [name]  - BP control and pulmonary rehab are tools to help you   Follow-up -6 months face to face visit but after CT chest without contrast  - return or call sooner if needed

## 2020-02-05 NOTE — Progress Notes (Signed)
OV 10/24/2019  Subjective:  Patient ID: Sandra [name], female , DOB: 13-Feb-1946 , age 74 y.o. , MRN: [id number] , ADDRESS: [place] [id number]   10/24/2019 -   Chief Complaint  Patient presents with  . Pulmonary Consult    Referred by Dr Rosanna Randy for eval of pulmonary nodules. Pt c/o SOB with exertion for the past year.      HPI Sandra [name] 28 y.o. 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  Louie Bun suggested patient see myself Dr. Chase Caller.  According to the patient she has had insidious onset of shortness of breath that is progressive in the last 3 to 4 years ever since she turned 74 years old.  Then 3 years ago she had breast cancer for which it was early and she had status post mastectomy with complete remission.  She is not able to do gardening and other things she used to do.  In fact when we walked her she did not desaturate 185 feet x 3 laps but did get mildly dyspneic.  She senses onset of chronic cough for the last 3 months.  There is also progressive.  She does not know why.  Then in March 2020 when she had unexplained right lower extremity edema involving the thigh knee and ankle.  She showed me pictures of this.  Apparently Doppler lower extremities ruled out DVT.  However she had a CT scan of the chest because of persistent shortness of breath.  Initially it was a coronary artery CT that then resulted in regular CT scan of the chest that I personally visualized.  This does not show emphysema pulmonary fibrosis but shows multiple lung nodules.  There is concern this could be due to prior breast cancer.  Therefore she has been referred here..  There is no prior history of tuberculosis.  There is no ACE inhibitor use.  She does have mild acid reflux.  She is on carvedilol nonspecific beta-blocker.  There is no prior history of asthma but she does have spring allergies.  Work-up below as shown moderate mitral valve regurgitation  with left ventricular hypertrophy and grade 2 diastolic dysfunction.  Also multiple lung nodules with some air trapping.  No emphysema fibrosis.  No pulmonary function test available.  Hemoglobin and creatinine are fine.    2018 Nuclear medicine stress test   Blood pressure demonstrated a hypertensive response to exercise with elevated BP at baseline.  The study is normal.  This is a low risk study.  The left ventricular ejection fraction is hyperdynamic (>65%).  Non diagnostic ECG due to abnormal baseline.    Simple office walk 185 feet x  3 laps goal with forehead probe 10/24/2019   O2 used ra  Number laps completed 3  Comments about pace avg  Resting Pulse Ox/HR 99% and 64/min  Final Pulse Ox/HR 97% and 79/min  Desaturated </= 88% no  Desaturated <= 3% points no  Got Tachycardic >/= 90/min no  Symptoms at end of test Mild dyspnea  Miscellaneous comments x   Results for ERICK, OXENDINE (MRN [id number]) as of 10/24/2019 10:14  Ref. Range 06/30/2019 11:45 09/29/2019 09:36  Creatinine Latest Ref Range: 0.44 - 1.00 mg/dL 0.69 0.76   Results for TALENA, NEIRA (MRN [id number]) as of 10/24/2019 10:14  Ref. Range 06/30/2019 11:45 09/29/2019 09:36  Hemoglobin Latest Ref Range: 11.1 - 15.9 g/dL 12.3     EXAM: -  CT CHEST WITHOUT CONTRAST - personally visualized  TECHNIQUE: Multidetector CT imaging of the chest was performed following the standard protocol without IV contrast.  COMPARISON:  Coronary CT 10/02/2019  FINDINGS: Cardiovascular: Normal heart size. No pericardial effusion. Coronary artery calcifications are again noted better detailed on recent coronary CT. Atherosclerotic plaque within the normal caliber aorta. The left vertebral artery arises directly from the aortic arch. Minimal plaque in the proximal great vessels. Central pulmonary arteries are normal caliber. Luminal evaluation precluded in the absence of contrast media.  Mediastinum/Nodes: No mediastinal  fluid or gas. Normal thyroid gland and thoracic inlet. No acute abnormality of the trachea or esophagus. No worrisome mediastinal or axillary adenopathy. Hilar nodal evaluation is limited in the absence of intravenous contrast media.  Lungs/Pleura: Redemonstration of multiple pulmonary nodules throughout both lungs. These include:  *7 mm nodule, right middle lobe (3/82) *3 mm nodule, medial basal segment right lower lobe (3/102) *3 mm subpleural nodule lateral basal segment left lower lobe (3/105) *3 mm nodule apicoposterior segment left upper lobe (3/53)  Few additional centrilobular and tree-in-bud nodularity is seen in the left apex (3/33) and in the periphery of right apex (3/35, 39). Some mild mosaic attenuation towards the lung bases may reflect areas of air trapping. More mild diffuse central airways thickening. No consolidation, features of edema, pneumothorax, or effusion.  Upper Abdomen: No acute abnormalities present in the visualized portions of the upper abdomen.  Musculoskeletal: Multilevel degenerative changes are present in the imaged portions of the spine. No acute osseous abnormality or suspicious osseous lesion. Postsurgical changes from prior right mastectomy.  IMPRESSION: 1. Redemonstration of multiple pulmonary nodules throughout both lungs, largest again measuring up to 7 mm in the right middle lobe. Given patient's history of breast cancer, metastatic disease is not excluded. Consider short-term interval follow-up imaging versus tissue sampling. Fleischner Society criteria explicitly do not apply in patients with known primary malignancy. 2. Some mild mosaic attenuation towards the lung bases may reflect areas of air trapping/small airways disease with mild chronic bronchitic features. 3. Coronary artery disease, better detailed on recent coronary CT. 4. Aortic Atherosclerosis (ICD10-I70.0).   Electronically Signed   By: Lovena Le M.D.    On: 10/11/2019 18:05  ECHO 09/22/19   IMPRESSIONS    1. Left ventricular ejection fraction, by estimation, is 60 to 65%. The  left ventricle has normal function. The left ventricle has no regional  wall motion abnormalities. There is mild left ventricular hypertrophy.  Left ventricular diastolic parameters  are consistent with Grade II diastolic dysfunction (pseudonormalization).  2. Right ventricular systolic function is normal. The right ventricular  size is normal. There is normal pulmonary artery systolic pressure.  3. Left atrial size was mildly dilated.  4. Mild to moderate mitral valve regurgitation    ROS - per HPI   OV 02/05/2020   Subjective:  Patient ID: Sandra [name], female , DOB: 1945/11/08, age 35 y.o. years. , MRN: [id number],  ADDRESS: [place] [id number] PCP  Jerrol Banana., MD Providers : Treatment Team:  Attending Provider: Brand Males, MD   Chief Complaint  Patient presents with  . Follow-up    pt has lung nodule . pt has sob when walking up hill   Type of visit: Telephone/Video Circumstance: COVID-19 national emergency Identification of patient JULIETA ROGALSKI with [birth year] and MRN [id number] - 2 person identifier Risks: Risks, benefits, limitations of telephone visit explained. Patient understood and verbalized agreement to proceed  Anyone else on call: - no just patient Patient location: patient home This provider location: [address] , [address], [place], [place]     HPI NADEEN SHIPMAN 74 y.o. -on this telephone visit to review results.  In terms of dyspnea/.Cough patient explicitly told me that she still has dyspnea and some cough this is documented below.  However she says the symptoms are not significant enough that she feels like she needs an intervention.  Asthma work-up is negative but the CT scan shows air trapping.  We are discussed starting empiric asthma inhaler but she is not interested.   Cough is mostly at night.  It is mild.  She is off fish oil  She is more worried about the nodules: Regarding the lung nodules these now show 45-monthstability.  She wants to know if this could be breast cancer metastasis.  The fact that these nodules are small and the been stable for 3 months is reassuring but I have explained to her she is not out of the woods and she required serial follow-up for a total of 2 years.  She is willing to do this.  Blood allergy work-up essentially negative.  Blood IgE normal.  Exam nitric oxide test normal.  QuantiFERON gold normal.  She does have a grade 2 diastolic dysfunction and mitral valve regurg.  She follows with cardiology for this.   SYMPTOM SCALE - ILD 02/05/2020   O2 use ra  Shortness of Breath 0 -> 5 scale with 5 being worst (score 6 If unable to do)  At rest 0  Simple tasks - showers, clothes change, eating, shaving 0-1  Household (dishes, doing bed, laundry) Mopping - goes up  Shopping x  Walking level at own pace x  Walking up Stairs 3  Total (30-36) Dyspnea Score x  How bad is your cough? Mild aggravating dry cough x months  How bad is your fatigue 3  How bad is nausea 0  How bad is vomiting?  0  How bad is diarrhea? 0  How bad is anxiety? Not really - situatoonal due to husband having falls. 3 back surgeries, recent syncope  How bad is depression Controlled with zoloft        IMPRESSION: CT CHEST 01/12/20 1. No evidence of interstitial lung disease. 2. Prominent patchy air trapping throughout both lungs, indicative of small airways disease. 3. Three month stability of scattered solid pulmonary nodules, reassuring. Suggest continued noncontrast chest CT follow-up in 6 months in this patient with a history of malignancy. 4. Three-vessel coronary atherosclerosis. 5. Tiny hiatal hernia. 6. Mild diffuse hepatic steatosis. 7. Aortic Atherosclerosis (ICD10-I70.0).   Electronically Signed   By: JIlona SorrelM.D.   On: 01/12/2020  13:14   RAST ALLERGY TEST   Results for MROSAMAE, ROCQUE(MRN [id number] as of 02/05/2020 08:56  Ref. Range 10/24/2019 11:39  Class Description Allergens Unknown Comment  Pecan, Hickory IgE Latest Ref Range: Class 0 kU/L <0.10  D Pteronyssinus IgE Latest Ref Range: Class 0 kU/L <0.10  D Farinae IgE Latest Ref Range: Class 0 kU/L <0.10  Cat Dander IgE Latest Ref Range: Class 0 kU/L <0.10  Dog Dander IgE Latest Ref Range: Class 0 kU/L <0.10  BGuatemalaGrass IgE Latest Ref Range: Class 0 kU/L <0.10  Johnson Grass IgE Latest Ref Range: Class 0 kU/L <0.10  Penicillium Chrysogen IgE Latest Ref Range: Class 0 kU/L <0.10  Cladosporium Herbarum IgE Latest Ref Range: Class 0 kU/L <0.10  Aspergillus  Fumigatus IgE Latest Ref Range: Class 0 kU/L <0.10  Alternaria Alternata IgE Latest Ref Range: Class 0 kU/L <0.10  Common Silver Wendee Copp IgE Latest Ref Range: Class 0 kU/L <0.10  Oak, White IgE Latest Ref Range: Class 0 kU/L <0.10  Elm, American IgE Latest Ref Range: Class 0/I kU/L 0.10 (A)  Maple/Box Elder IgE Latest Ref Range: Class 0 kU/L <0.10  White Mulberry IgE Latest Ref Range: Class 0 kU/L <0.10  Cedar, Georgia IgE Latest Ref Range: Class 0 kU/L <0.10  Ragweed, Short IgE Latest Ref Range: Class 0 kU/L <0.10  Pigweed, Rough IgE Latest Ref Range: Class 0 kU/L <0.10  Cockroach, Korea IgE Latest Ref Range: Class 0 kU/L <0.10  Cottonwood IgE Latest Ref Range: Class 0 kU/L <0.10  IgE (Immunoglobulin E), Serum Latest Ref Range: 6 - 495 IU/mL 14  Timothy Grass IgE Latest Ref Range: Class 0/I kU/L 0.16 (A)  Sheep Sorrel IgE Qn Latest Ref Range: Class 0 kU/L <0.10  Mouse Urine IgE Latest Ref Range: Class 0 kU/L <0.10   Results for TALISHIA, BETZLER (MRN [id number]) as of 02/05/2020 08:56  Ref. Range 12/04/2019 14:56  FeNO level (ppb) Unknown 17   PFT Results Latest Ref Rng & Units 12/04/2019  FVC-Pre L 2.02  FVC-Predicted Pre % 73  FVC-Post L 2.04  FVC-Predicted Post % 74  Pre FEV1/FVC % % 81  Post  FEV1/FCV % % 84  FEV1-Pre L 1.63  FEV1-Predicted Pre % 79  FEV1-Post L 1.70  DLCO uncorrected ml/min/mmHg 15.22  DLCO UNC% % 82  DLCO corrected ml/min/mmHg 16.13  DLCO COR %Predicted % 87  DLVA Predicted % 105  TLC L 3.78  TLC % Predicted % 77  RV % Predicted % 76   QuantiFERON Incubation  Incubation performed.   QuantiFERON-TB Gold Plus Negative Negative       has a past medical history of Abnormality of left ventricle of heart, Anemia, Anxiety, Arthritis, BRCA gene mutation negative (10/2016), Breast cancer ([place]) (10/16/2016), Celiac disease, Depression, Diastolic dysfunction, Dyspnea, GERD (gastroesophageal reflux disease), Glaucoma, Headache, Heart murmur, Hypertension, Hypothyroidism, Pneumonia, Raynaud's disease, and RLS (restless legs syndrome).   reports that she has never smoked. She has never used smokeless tobacco.  Past Surgical History:  Procedure Laterality Date  . ABDOMINAL HYSTERECTOMY    . APPENDECTOMY    . AXILLARY LYMPH NODE BIOPSY Right 03/05/2017   Procedure: AXILLARY LYMPH NODE BIOPSY;  Surgeon: Robert Bellow, MD;  Location: [facility] ORS;  Service: General;  Laterality: Right;  . CATARACT EXTRACTION Bilateral   . CESAREAN SECTION    . COLONOSCOPY WITH PROPOFOL N/A 05/19/2015   Procedure: COLONOSCOPY WITH PROPOFOL;  Surgeon: Manya Silvas, MD;  Location: [facility] ENDOSCOPY;  Service: Endoscopy;  Laterality: N/A;  . COLONOSCOPY WITH PROPOFOL N/A 08/01/2019   Procedure: COLONOSCOPY WITH PROPOFOL;  Surgeon: Lucilla Lame, MD;  Location[facility] ENDOSCOPY;  Service: Endoscopy;  Laterality: N/A;  . EYE SURGERY    . HAND SURGERY Left   . MASTECTOMY Right 11/07/2016   RESIDUAL INVASIVE MAMMARY CARCINOMA, SUBAREOLAR ANTERIOR TO PREVIOUS   . SENTINEL NODE BIOPSY Right 11/07/2016   Procedure: SENTINEL NODE BIOPSY;  Surgeon: Robert Bellow, MD;  Location: [facility] ORS;  Service: General;  Laterality: Right;  . SIMPLE MASTECTOMY WITH AXILLARY SENTINEL NODE BIOPSY Right  11/07/2016   6 mm ER/PR 100%; Her 2 neu not overexpressed, T1b, N0.  Surgeon: Robert Bellow, MD;  Location: [facility] ORS;  Service: General;  Laterality: Right;  .  TONSILLECTOMY  AGE 50  . UPPER GI ENDOSCOPY  01/12/06   hiatus hernia    No Known Allergies  Immunization History  Administered Date(s) Administered  . Influenza, High Dose Seasonal PF 03/04/2015, 04/07/2016, 03/16/2017, 02/09/2018, 02/19/2019  . PFIZER SARS-COV-2 Vaccination 06/18/2019, 07/12/2019, 01/27/2020  . Pneumococcal Conjugate-13 02/05/2014  . Pneumococcal Polysaccharide-23 04/07/2016  . Tdap 03/18/2008    Family History  Problem Relation Age of Onset  . Cancer Mother   . Anemia Mother   . Other Mother        lymphosarcoma  . Alcohol abuse Father   . Prostate cancer Father   . Breast cancer Sister        76; 1/2 sister, shared mother.  . Cancer Sister        bile duct  . Stroke Maternal Grandmother   . Breast cancer Maternal Grandmother 68  . Heart attack Maternal Grandfather   . CAD Paternal Grandfather      Current Outpatient Medications:  .  ALPRAZolam (XANAX) 0.5 MG tablet, Take 1 tablet (0.5 mg total) by mouth at bedtime., Disp: 90 tablet, Rfl: 1 .  atorvastatin (LIPITOR) 40 MG tablet, Take 1 tablet (40 mg total) by mouth daily., Disp: 90 tablet, Rfl: 3 .  Calcium Carbonate (CALCIUM 600 PO), Take 600 mg by mouth daily at 3 pm. , Disp: , Rfl:  .  carvedilol (COREG) 3.125 MG tablet, TAKE 1 TABLET BY MOUTH  TWICE DAILY, Disp: 180 tablet, Rfl: 1 .  Cholecalciferol (VITAMIN D3) 1000 units CAPS, Take 1,000 Units by mouth., Disp: , Rfl:  .  dicyclomine (BENTYL) 10 MG capsule, TAKE 1 CAPSULE BY MOUTH 3  TIMES DAILY BEFORE MEALS, Disp: 270 capsule, Rfl: 0 .  hydroxypropyl methylcellulose / hypromellose (ISOPTO TEARS / GONIOVISC) 2.5 % ophthalmic solution, Place 1 drop into both eyes 4 (four) times daily as needed for dry eyes (scheduled each morning & then as needed)., Disp: , Rfl:  .  latanoprost (XALATAN)  0.005 % ophthalmic solution, INT 1 GTT IN OD 1 TIME IN THE EVE, Disp: , Rfl: 5 .  letrozole (FEMARA) 2.5 MG tablet, Take 1 tablet (2.5 mg total) by mouth daily., Disp: 90 tablet, Rfl: 3 .  levothyroxine (SYNTHROID) 100 MCG tablet, TAKE 1 TABLET BY MOUTH 30  MINUTES BEFORE BREAKFAST, Disp: 90 tablet, Rfl: 2 .  Multiple Vitamin (MULTIVITAMIN WITH MINERALS) TABS tablet, Take 1 tablet by mouth daily at 3 pm., Disp: , Rfl:  .  omeprazole (PRILOSEC) 40 MG capsule, TAKE 1 CAPSULE BY MOUTH  DAILY, Disp: 90 capsule, Rfl: 3 .  sennosides-docusate sodium (SENOKOT-S) 8.6-50 MG tablet, Take 1 tablet by mouth as needed for constipation., Disp: , Rfl:  .  sertraline (ZOLOFT) 100 MG tablet, TAKE 1 TABLET BY MOUTH  DAILY, Disp: 90 tablet, Rfl: 1 .  Simethicone (GAS-X PO), Take 1 tablet by mouth as needed., Disp: , Rfl:  .  furosemide (LASIX) 20 MG tablet, Take 1 tablet (20 mg total) by mouth daily as needed., Disp: 90 tablet, Rfl: 1 .  Omega-3 Fatty Acids (FISH OIL PO), Take 1 capsule by mouth daily. , Disp: , Rfl:       Objective:   There were no vitals filed for this visit.  Estimated body mass index is 24.09 kg/m as calculated from the following:   Height as of 12/30/19: 5' 3"  (1.6 m).   Weight as of 12/31/19: 136 lb (61.7 kg).  @WEIGHTCHANGE @  There were no vitals filed for this visit.  Physical Exam Sounded normal on the telephone visit      Assessment:       ICD-10-CM   1. Lung nodule  R91.1 CT Chest Wo Contrast  2. Dyspnea on exertion  R06.00   3. Chronic cough  R05   4. Pulmonary nodules/lesions, multiple  R91.8        Plan:     Patient Instructions  Dyspnea on exertion Chronic cough Pulmonary nodules/lesions, multiple History Seasonal Allergy  -Shortness of breath: is definitely due to stiff heart muscle with leaky mitral valve +/-  asthma    -Cough: This could be asthma or could be related to the nodules   -Multiple lung nodules:   Stable x 3 months as of Aug 2021. I do  not think the nodules are related to breast cancer.Blood TB test is negative  Plan -  Repeat CT scan of the chest in 6 months - Hold off starting empiric asthma inhaler treatment as per our discussion  -Discussed the stiff heart muscle and leaky mitral valve with your cardiologist Dr. [name]  - BP control and pulmonary rehab are tools to help you   Follow-up -6 months face to face visit but after CT chest without contrast  - return or call sooner if needed  ( Level 03: Esbt 20-29 min  visit spent in visit type: telephone visit in total care time and counseling or/and coordination of care by this undersigned MD - Dr Brand Males. This includes one or more of the following all delivered on this same day 02/05/2020: pre-charting, chart review, note writing, documentation discussion of test results, diagnostic or treatment recommendations, prognosis, risks and benefits of management options, instructions, education, compliance or risk-factor reduction. It excludes time spent by the [place] or office staff in the care of the patient. Actual time was 20 min)    SIGNATURE    Dr. Brand Males, M.D., F.C.C.P,  Pulmonary and Critical Care Medicine Staff Physician, [place] Director - Interstitial Lung Disease  Program  Pulmonary [place] at [place], [place], [address]  Pager: [phone], If no answer or between  15:00h - 7:00h: call [phone] Telephone: [phone]  9:30 AM 02/05/2020

## 2020-02-12 ENCOUNTER — Ambulatory Visit: Payer: Medicare Other | Admitting: Internal Medicine

## 2020-02-16 ENCOUNTER — Telehealth: Payer: Self-pay | Admitting: Internal Medicine

## 2020-02-16 DIAGNOSIS — R002 Palpitations: Secondary | ICD-10-CM

## 2020-02-16 NOTE — Telephone Encounter (Signed)
1st attempt to contact the patient. She requested a call back in 5-10 minutes.

## 2020-02-16 NOTE — Telephone Encounter (Signed)
I do not see a history of atrial fibrillation in the patient's chart, nor do any of her prior tracings in our system demonstrate atrial fibrillation.  I suggest that we arrange for a 14 day event monitor for further assessment of possible underlying arrhythmia.  Sandra [name] should f/u with me or an APP after completion of the monitor.  Nelva Bush, MD [facility]

## 2020-02-16 NOTE — Telephone Encounter (Signed)
Patient c/o Palpitations:  High priority if patient c/o lightheadedness, shortness of breath, or chest pain  1) How long have you had palpitations/irregular HR/ Afib? Are you having the symptoms now?    Prolonged episode on Friday night   2) Are you currently experiencing lightheadedness, SOB or CP?    No symptoms at this time   3) Do you have a history of afib (atrial fibrillation) or irregular heart rhythm?    Yes but interim and brief   4) Have you checked your BP or HR? (document readings if available):     HR got up into 120's during episode   Currently back to normal      5) Are you experiencing any other symptoms?  During episode Headache Lightheaded went to ED but converted back when arriving to parking lot .  Now concerned about what she should do in the future should sustained episode happen again

## 2020-02-16 NOTE — Telephone Encounter (Signed)
Spoke with the patient. Patient sts that she has a hx of Afib. Her previous episodes were short in duration 5-10 minutes and did not occur frequently.  Patient reports an episode on Fri 02/13/20 that occurred around 9 pm. She developed an instant headache, light headiness, sob, and pounding in her chest. Her heartate averaged 88-90 bps, did go as high as 120 bpm. She took a asa 81 mg and headed to the ED. While in the ED parking lot she converted back to NSR. The episode lasted 1-1 1/2 hours in duration.  Patient is currently asymptomatic and has not had any reoccurrence. She was able to go on her normal walk yesterday without incident.  She would like to know what she should do in the future if the reoccurs. She is taking all her medications as prescribed.  Advised the patient that I will fwd the message to Dr. [name] to advise.

## 2020-02-16 NOTE — Telephone Encounter (Signed)
Patient made aware of Dr. Darnelle Bos response and recommendation. Patient is agreeable with wearing a 14 day zio monitor. Adv the patient that I will place the order for the monitor to be mailed to her home. Verified the patient mailing address. Adv her to follow the application and return  Instructions. The zio is to be worn for 14 days.  6 wk f/u appt scheduled with Dr. [name] on 04/01/20 @ 11am.  Patient is to contact the office sooner if any reoccurrence of symptoms.

## 2020-02-19 ENCOUNTER — Ambulatory Visit (INDEPENDENT_AMBULATORY_CARE_PROVIDER_SITE_OTHER): Payer: Medicare Other

## 2020-02-19 DIAGNOSIS — R002 Palpitations: Secondary | ICD-10-CM | POA: Diagnosis not present

## 2020-03-06 ENCOUNTER — Other Ambulatory Visit: Payer: Self-pay | Admitting: Family Medicine

## 2020-03-06 NOTE — Telephone Encounter (Signed)
Requested Prescriptions  Pending Prescriptions Disp Refills  . sertraline (ZOLOFT) 100 MG tablet [Pharmacy Med Name: SERTRALINE HCL 100MG TABLET] 90 tablet 1    Sig: TAKE 1 TABLET BY MOUTH  DAILY     Psychiatry:  Antidepressants - SSRI Passed - 03/06/2020  9:45 PM      Passed - Completed PHQ-2 or PHQ-9 in the last 360 days.      Passed - Valid encounter within last 6 months    Recent Outpatient Visits          2 months ago Celiac disease   [facility] Jerrol Banana., MD   3 months ago Chronic obstructive pulmonary disease, unspecified COPD type [facility])   [facility] Jerrol Banana., MD   4 months ago Multiple lung nodules on [place] Jerrol Banana., MD   6 months ago Tenderness of right calf   [facility] [place], Dionne Bucy, MD   7 months ago Arthritis of knee   [facility] Trinna Post, [place]      Future Appointments            In 2 weeks Jerrol Banana., MD [facility], [place]   In 3 weeks End, Harrell Gave, MD [facility], [place]   In 3 months Jerrol Banana., MD [facility], [place]   In 4 months End, Harrell Gave, MD [facility], [place]

## 2020-03-08 DIAGNOSIS — R002 Palpitations: Secondary | ICD-10-CM | POA: Diagnosis not present

## 2020-03-11 ENCOUNTER — Telehealth: Payer: Self-pay | Admitting: Internal Medicine

## 2020-03-11 NOTE — Telephone Encounter (Signed)
Monitor uploaded yesterday to Dr Darnelle Bos basket. Await his review.

## 2020-03-11 NOTE — Telephone Encounter (Signed)
Patient would like monitor results.

## 2020-03-12 NOTE — Telephone Encounter (Signed)
Patient made aware of monitor results and appointment rescheduled.

## 2020-03-12 NOTE — Telephone Encounter (Signed)
Patient called back after stating she missed a call from the office. Made patient aware the monitor was uploaded and that we are awaiting Dr. Marisue Humble review at this time

## 2020-03-17 ENCOUNTER — Encounter: Payer: Self-pay | Admitting: Nurse Practitioner

## 2020-03-17 ENCOUNTER — Ambulatory Visit: Payer: Medicare Other | Admitting: Nurse Practitioner

## 2020-03-17 ENCOUNTER — Other Ambulatory Visit: Payer: Self-pay

## 2020-03-17 ENCOUNTER — Other Ambulatory Visit: Payer: Self-pay | Admitting: *Deleted

## 2020-03-17 ENCOUNTER — Ambulatory Visit: Payer: Medicare Other | Admitting: Family Medicine

## 2020-03-17 VITALS — BP 138/60 | HR 62 | Ht 63.0 in | Wt 132.0 lb

## 2020-03-17 DIAGNOSIS — E782 Mixed hyperlipidemia: Secondary | ICD-10-CM

## 2020-03-17 DIAGNOSIS — I480 Paroxysmal atrial fibrillation: Secondary | ICD-10-CM

## 2020-03-17 DIAGNOSIS — E038 Other specified hypothyroidism: Secondary | ICD-10-CM | POA: Diagnosis not present

## 2020-03-17 DIAGNOSIS — I10 Essential (primary) hypertension: Secondary | ICD-10-CM | POA: Diagnosis not present

## 2020-03-17 DIAGNOSIS — I5032 Chronic diastolic (congestive) heart failure: Secondary | ICD-10-CM | POA: Diagnosis not present

## 2020-03-17 MED ORDER — APIXABAN 5 MG PO TABS: ORAL_TABLET | ORAL | 3 refills | Status: DC

## 2020-03-17 MED ORDER — CARVEDILOL 6.25 MG PO TABS
6.2500 mg | ORAL_TABLET | Freq: Two times a day (BID) | ORAL | 3 refills | Status: DC
Start: 2020-03-17 — End: 2020-04-28

## 2020-03-17 NOTE — Patient Instructions (Signed)
Medication Instructions:  - Your physician has recommended you make the following change in your medication:   1) INCREASE coreg to 6.25 mg: - take 1 tablet by mouth twice daily   2) START eliquis 5 mg - take 1 tablet by mouth twice daily   *If you need a refill on your cardiac medications before your next appointment, please call your pharmacy*   Lab Work: - Your physician recommends that you have lab work today: BMP/ Magnesium/ TSH  If you have labs (blood work) drawn today and your tests are completely normal, you will receive your results only by: [name] MyChart Message (if you have MyChart) OR . A paper copy in the mail If you have any lab test that is abnormal or we need to change your treatment, we will call you to review the results.   Testing/Procedures: - none ordered   Follow-Up: At [facility], you and your health needs are our priority.  As part of our continuing mission to provide you with exceptional heart care, we have created designated Provider Care Teams.  These Care Teams include your primary Cardiologist (physician) and Advanced Practice Providers (APPs -  Physician Assistants and Nurse Practitioners) who all work together to provide you with the care you need, when you need it.  We recommend signing up for the patient portal called "MyChart".  Sign up information is provided on this After Visit Summary.  MyChart is used to connect with patients for Virtual Visits (Telemedicine).  Patients are able to view lab/test results, encounter notes, upcoming appointments, etc.  Non-urgent messages can be sent to your provider as well.   To learn more about what you can do with MyChart, go to [email or web address].    Your next appointment:   1 month(s)  The format for your next appointment:   In Person  Provider:   Nelva Bush, MD/ Murray Hodgkins, NP   Other Instructions   Eliquis (Apixaban) oral tablets What is this medicine? APIXABAN (a PIX a ban) is  an anticoagulant (blood thinner). It is used to lower the chance of stroke in people with a medical condition called atrial fibrillation. It is also used to treat or prevent blood clots in the lungs or in the veins. This medicine may be used for other purposes; ask your health care provider or pharmacist if you have questions. COMMON BRAND NAME(S): Eliquis What should I tell my health care provider before I take this medicine? They need to know if you have any of these conditions:  antiphospholipid antibody syndrome  bleeding disorders  bleeding in the brain  blood in your stools (black or tarry stools) or if you have blood in your vomit  history of blood clots  history of stomach bleeding  kidney disease  liver disease  mechanical heart valve  an unusual or allergic reaction to apixaban, other medicines, foods, dyes, or preservatives  pregnant or trying to get pregnant  breast-feeding How should I use this medicine? Take this medicine by mouth with a glass of water. Follow the directions on the prescription label. You can take it with or without food. If it upsets your stomach, take it with food. Take your medicine at regular intervals. Do not take it more often than directed. Do not stop taking except on your doctor's advice. Stopping this medicine may increase your risk of a blood clot. Be sure to refill your prescription before you run out of medicine. Talk to your pediatrician regarding the use of this  medicine in children. Special care may be needed. Overdosage: If you think you have taken too much of this medicine contact a poison control center or emergency room at once. NOTE: This medicine is only for you. Do not share this medicine with others. What if I miss a dose? If you miss a dose, take it as soon as you can. If it is almost time for your next dose, take only that dose. Do not take double or extra doses. What may interact with this medicine? This medicine may  interact with the following:  aspirin and aspirin-like medicines  certain medicines for fungal infections like ketoconazole and itraconazole  certain medicines for seizures like carbamazepine and phenytoin  certain medicines that treat or prevent blood clots like warfarin, enoxaparin, and dalteparin  clarithromycin  NSAIDs, medicines for pain and inflammation, like ibuprofen or naproxen  rifampin  ritonavir  St. John's wort This list may not describe all possible interactions. Give your health care provider a list of all the medicines, herbs, non-prescription drugs, or dietary supplements you use. Also tell them if you smoke, drink alcohol, or use illegal drugs. Some items may interact with your medicine. What should I watch for while using this medicine? Visit your healthcare professional for regular checks on your progress. You may need blood work done while you are taking this medicine. Your condition will be monitored carefully while you are receiving this medicine. It is important not to miss any appointments. Avoid sports and activities that might cause injury while you are using this medicine. Severe falls or injuries can cause unseen bleeding. Be careful when using sharp tools or knives. Consider using an Copy. Take special care brushing or flossing your teeth. Report any injuries, bruising, or red spots on the skin to your healthcare professional. If you are going to need surgery or other procedure, tell your healthcare professional that you are taking this medicine. Wear a medical ID bracelet or chain. Carry a card that describes your disease and details of your medicine and dosage times. What side effects may I notice from receiving this medicine? Side effects that you should report to your doctor or health care professional as soon as possible:  allergic reactions like skin rash, itching or hives, swelling of the face, lips, or tongue  signs and symptoms of  bleeding such as bloody or black, tarry stools; red or dark-brown urine; spitting up blood or brown material that looks like coffee grounds; red spots on the skin; unusual bruising or bleeding from the eye, gums, or nose  signs and symptoms of a blood clot such as chest pain; shortness of breath; pain, swelling, or warmth in the leg  signs and symptoms of a stroke such as changes in vision; confusion; trouble speaking or understanding; severe headaches; sudden numbness or weakness of the face, arm or leg; trouble walking; dizziness; loss of coordination This list may not describe all possible side effects. Call your doctor for medical advice about side effects. You may report side effects to FDA at 1-800-FDA-1088. Where should I keep my medicine? Keep out of the reach of children. Store at room temperature between 20 and 25 degrees C (68 and 77 degrees F). Throw away any unused medicine after the expiration date. NOTE: This sheet is a summary. It may not cover all possible information. If you have questions about this medicine, talk to your doctor, pharmacist, or health care provider.  2020 Elsevier/Gold Standard (2018-01-23 17:39:34)

## 2020-03-17 NOTE — Progress Notes (Signed)
Office Visit    Patient Name: Sandra [name] Date of Encounter: 03/17/2020  Primary Care Provider:  Jerrol Banana., MD Primary Cardiologist:  Nelva Bush, MD  Chief Complaint    74 year old female with a history of HFpEF, right-sided breast cancer status post mastectomy without chemo or radiation, celiac disease, Raynaud's disease, hypothyroidism, restless leg syndrome, GERD, and anxiety, who presents for follow-up related to recent finding of paroxysmal atrial fibrillation.  Past Medical History    Past Medical History:  Diagnosis Date  . (HFpEF) heart failure with preserved ejection fraction ([place])    a. 08/2019 Echo: EF 60-65%, no rwma, mild LVH, Gr2 DD, nl RV fxn. Nl pASP. Mildly dil LA. Mild to mod MR.  [name] Anemia   . Anxiety   . Arthritis   . BRCA gene mutation negative 10/2016   NEGATIVE: [employer]  . Breast cancer ([place]) 10/16/2016   T1c,N0;, GRADE I/III, 1.6cm. ER/PR pos  HER2 not over expressed, Right Upper Outer  . Celiac disease   . Depression   . Dyspnea    WITH EXERTION  . GERD (gastroesophageal reflux disease)    OCC  . Glaucoma    right eye  . Headache    H/O MIGRAINES AS TEENAGER  . Heart murmur    a. 08/2019 Echo: mild to mod MR.  [name] Hypertension   . Hypothyroidism   . Non-obstructive CAD (coronary artery disease)    a. 01/2017 MV: Hypertensive response. No ischemia/infarct. EF >65%; b. 09/2019 Cor CTA: Ca2+ = 9.29 (36th percentile). LAD calcified plaque (0-24%), otw nl. Multipel bilat pulm nodules up to 59m.  .Marland KitchenPAF ([place]oxysmal [place]) [place]    a. 02/2020 Zio: predominantly RSR, 65 (50-105), rare PACs/PVCs, 8 beats NSVT, multiple episodes of PAF lasting up to 1hr 261ms. Avg AF rate 130 (93-170). AF burden <1%. Triggered events = RSR, PACs, and PAF; b. CHA2DS2VASc = 6.  . Pneumonia    YEARS AGO  . Pulmonary nodules    a. 09/2019 Cor CTA: incidental finding of multiple bilat pulm nodules; b. 12/2019 High Res CT: stable, scattered solid  pulm nodules.  . Raynaud's disease   . RLS (restless legs syndrome)    Past Surgical History:  Procedure Laterality Date  . ABDOMINAL HYSTERECTOMY    . APPENDECTOMY    . AXILLARY LYMPH NODE BIOPSY Right 03/05/2017   Procedure: AXILLARY LYMPH NODE BIOPSY;  Surgeon: ByRobert BellowMD;  Location: [facility] ORS;  Service: General;  Laterality: Right;  . CATARACT EXTRACTION Bilateral   . CESAREAN SECTION    . COLONOSCOPY WITH PROPOFOL N/A 05/19/2015   Procedure: COLONOSCOPY WITH PROPOFOL;  Surgeon: RoManya SilvasMD;  [facility];  Service: Endoscopy;  Laterality: N/A;  . COLONOSCOPY WITH PROPOFOL N/A 08/01/2019   Procedure: COLONOSCOPY WITH PROPOFOL;  Surgeon: WoLucilla LameMD;  [facility];  Service: Endoscopy;  Laterality: N/A;  . EYE SURGERY    . HAND SURGERY Left   . MASTECTOMY Right 11/07/2016   RESIDUAL INVASIVE MAMMARY CARCINOMA, SUBAREOLAR ANTERIOR TO PREVIOUS   . SENTINEL NODE BIOPSY Right 11/07/2016   Procedure: SENTINEL NODE BIOPSY;  Surgeon: ByRobert BellowMD;  Location: [facility] ORS;  Service: General;  Laterality: Right;  . SIMPLE MASTECTOMY WITH AXILLARY SENTINEL NODE BIOPSY Right 11/07/2016   6 mm ER/PR 100%; Her 2 neu not overexpressed, T1b, N0.  Surgeon: ByRobert BellowMD;  Location: [facility] ORS;  Service: General;  Laterality: Right;  . TONSILLECTOMY  AGE 63  . UPPER GI ENDOSCOPY  01/12/06   hiatus hernia    Allergies  No Known Allergies  History of Present Illness    74 year old female with the above past medical history including HFpEF, right-sided breast cancer status post mastectomy without chemo or radiation, celiac disease, Raynaud's disease, hypothyroidism, restless leg syndrome, GERD, and anxiety.  She was previously evaluated by Dr. Clayborn Bigness for preoperative screening in the setting of right mastectomy in the summer 2018.  She was found to have an abnormal ECG and was subsequently cleared for surgery without additional testing.  She  is established with our office in August 2018.  Echocardiogram at that time showed an EF of 60-65% with mild concentric LVH with moderate hypertrophy of the septum with near cavity obliteration with systole without significant outflow tract gradient.  Grade 2 diastolic dysfunction as well as mild mitral vegetation were noted as well.  She underwent stress testing in September 2018 that showed a hypertensive response to exercise but no significant ischemia or infarct.  In April 2021, she complained of dyspnea on exertion underwent repeat echo showing an EF of 60-65%, no regional wall motion abnormalities, mild LVH, grade 2 diastolic dysfunction, and mild to moderate mitral regurgitation.  She underwent coronary CT angiography in May 2021 which showed a calcium score of 9 (35th percentile), with calcified plaque in the proximal LAD estimated at 0 to 24% with otherwise nonobstructive disease.  She was incidentally noted to have multiple pulmonary nodules throughout bilateral lungs with the largest measuring 7 mm in the right middle lobe and she has since followed with pulmonology.    She was last seen in cardiology clinic in August, at which time she was doing well.  Though she had previously been dealing with right lower extremity swelling, this had completely resolved once off of amlodipine (and placed on Lasix), and she also noted improved energy.  On September 20, she contacted our office to report prolonged palpitations.  A Zio monitor was subsequently placed and showed multiple episodes of paroxysmal atrial fibrillation lasting up to 1 hour and 25 minutes with an average rate of 130 bpm (93-170).  A. fib burden was less than 1%.  Triggered events corresponded to sinus rhythm, PACs, and PAF.  Since coming off of the monitor, she has not had any recurrent palpitations/afib, that she is aware of.  She denies chest pain, dyspnea, pnd, orthopnea, n, v, dizziness, syncope, edema, weight gain, or early satiety.    Home Medications    Prior to Admission medications   Medication Sig Start Date End Date Taking? Authorizing Provider  ALPRAZolam Duanne Moron) 0.5 MG tablet Take 1 tablet (0.5 mg total) by mouth at bedtime. 10/14/19   Jerrol Banana., MD  atorvastatin (LIPITOR) 40 MG tablet Take 1 tablet (40 mg total) by mouth daily. 12/30/19 03/29/20  Rise Mu, PA-C  Calcium Carbonate (CALCIUM 600 PO) Take 600 mg by mouth daily at 3 pm.     [provider]  carvedilol (COREG) 3.125 MG tablet TAKE 1 TABLET BY MOUTH  TWICE DAILY 12/26/19   Jerrol Banana., MD  Cholecalciferol (VITAMIN D3) 1000 units CAPS Take 1,000 Units by mouth.    [provider]  dicyclomine (BENTYL) 10 MG capsule TAKE 1 CAPSULE BY MOUTH 3  TIMES DAILY BEFORE MEALS 12/16/19   Jerrol Banana., MD  furosemide (LASIX) 20 MG tablet Take 1 tablet (20 mg total) by mouth daily as needed. 01/26/20  Rise Mu, PA-C  hydroxypropyl methylcellulose / hypromellose (ISOPTO TEARS / GONIOVISC) 2.5 % ophthalmic solution Place 1 drop into both eyes 4 (four) times daily as needed for dry eyes (scheduled each morning & then as needed).    [provider]  latanoprost (XALATAN) 0.005 % ophthalmic solution INT 1 GTT IN OD 1 TIME IN THE EVE 02/06/17   [provider]  letrozole (FEMARA) 2.5 MG tablet Take 1 tablet (2.5 mg total) by mouth daily. 09/23/18   Robert Bellow, MD  levothyroxine (SYNTHROID) 100 MCG tablet TAKE 1 TABLET BY MOUTH 30  MINUTES BEFORE BREAKFAST 02/01/20   Jerrol Banana., MD  Multiple Vitamin (MULTIVITAMIN WITH MINERALS) TABS tablet Take 1 tablet by mouth daily at 3 pm.    [provider]  Omega-3 Fatty Acids (FISH OIL PO) Take 1 capsule by mouth daily.     [provider]  omeprazole (PRILOSEC) 40 MG capsule TAKE 1 CAPSULE BY MOUTH  DAILY 09/17/19   Jerrol Banana., MD  sennosides-docusate sodium (SENOKOT-S) 8.6-50 MG tablet Take 1 tablet by mouth as needed  for constipation.    [provider]  sertraline (ZOLOFT) 100 MG tablet TAKE 1 TABLET BY MOUTH  DAILY 03/06/20   Jerrol Banana., MD  Simethicone (GAS-X PO) Take 1 tablet by mouth as needed.    [provider]    Review of Systems    Tachypalps leading to monitoring w/ finding of PAF - no palps since returning monitor.  She denies chest pain, dyspnea, pnd, orthopnea, n, v, dizziness, syncope, edema, weight gain, or early satiety. All other systems reviewed and are otherwise negative except as noted above.  Physical Exam    VS:  BP 138/60 (BP Location: Left Arm, Patient Position: Sitting, Cuff Size: Normal)   Pulse 62   Ht 5' 3"  (1.6 m)   Wt 132 lb (59.9 kg)   BMI 23.38 kg/m  , BMI Body mass index is 23.38 kg/m. GEN: Well nourished, well developed, in no acute distress. HEENT: normal. Neck: Supple, no JVD, carotid bruits, or masses. Cardiac: RRR, 2/6 syst murmur @ left lower sternal border, no rubs, or gallops. No clubbing, cyanosis, edema.  Radials/PT 2+ and equal bilaterally.  Respiratory:  Respirations regular and unlabored, clear to auscultation bilaterally. GI: Soft, nontender, nondistended, BS + x 4. MS: no deformity or atrophy. Skin: warm and dry, no rash. Neuro:  Strength and sensation are intact. Psych: Normal affect.  Accessory Clinical Findings    ECG personally reviewed by me today -sinus arrhythmia, 62, ? Prior septal infarct, LVH with repolarization abnormalities- no acute changes.  Lab Results  Component Value Date   WBC 5.5 11/24/2019   HGB 11.7 11/24/2019   HCT 35.4 11/24/2019   MCV 89 11/24/2019   PLT 238 11/24/2019   Lab Results  Component Value Date   CREATININE 0.73 12/03/2019   BUN 12 12/03/2019   NA 143 12/03/2019   K 4.5 12/03/2019   CL 107 (H) 12/03/2019   CO2 26 12/03/2019   Lab Results  Component Value Date   ALT 34 (H) 11/24/2019   AST 25 11/24/2019   ALKPHOS 91 11/24/2019   BILITOT 0.6 11/24/2019   Lab  Results  Component Value Date   CHOL 234 (H) 06/30/2019   HDL 58 06/30/2019   LDLCALC 144 (H) 06/30/2019   TRIG 179 (H) 06/30/2019   CHOLHDL 4.0 06/30/2019    Lab Results  Component Value Date  HGBA1C 5.6 05/31/2016    Assessment & Plan    1.  Paroxysmal atrial fibrillation: Patient recently had an episode of prolonged palpitations and subsequently underwent event monitoring which did show paroxysmal atrial fibrillation with an average rate of 130 bpm and a range of 93-170.  A. fib burden was less than 1% during monitoring period.  She has not had any recurrent palpitations/A. fib, that she is aware of.  We a long discussion today about the diagnosis of atrial fibrillation and role of anticoagulation.  Her granddaughter, who is a Marine scientist, is present with her today.  All questions answered.  I will check a basic metabolic panel, magnesium, and TSH today.  Eliquis 5 mg twice daily added.  Regarding rate/rhythm management, we agreed to increase carvedilol to 6.25 mg twice daily (average heart rate during monitoring period 65).  If titration of carvedilol alone is inadequate in limiting symptoms, we discussed potentially switching to metoprolol or diltiazem.  We also discussed the possibility of the addition of an antiarrhythmic and referral to our A. fib team at some point in the future if necessary.  2.  Essential hypertension: Pressure mildly elevated today.  Increasing carvedilol as above.  3.  Hyperlipidemia: LDL of 144 in February 2021.  She is now on a statin and will plan on follow-up lipids at her next appointment as she is not fasting today.  4.  Chronic heart failure with preserved ejection fraction: She has chronic dyspnea on exertion, which has been stable.  Previous echo showed an EF of 60-65% in April of this year and nonobstructive LAD disease on coronary CTA in May.  Blood pressure mildly elevated today and increasing carvedilol in the setting of paroxysmal atrial fibrillation and  hypertension.  She has a prescription for Lasix but does not require it at this time.  5.  Hypothyroidism: On Synthroid.  TSH was normal in June.  In the setting of new development of atrial fibrillation and palpitations, will follow-up TSH today.  6.  Nonobstructive CAD: Moderate, nonobstructive LAD disease noted on coronary CTA earlier this year.  She has not been having chest pain.  She remains on statin therapy.  No aspirin in the setting of initiation of Eliquis.  7.  Disposition: Follow-up basic metabolic panel, magnesium, and TSH today.  Plan to follow-up in clinic with CBC, complete metabolic panel, and lipids at that time.   Murray Hodgkins, NP 03/17/2020, 12:25 PM

## 2020-03-18 LAB — BASIC METABOLIC PANEL
BUN/Creatinine Ratio: 20 (ref 12–28)
BUN: 13 mg/dL (ref 8–27)
CO2: 25 mmol/L (ref 20–29)
Calcium: 8.9 mg/dL (ref 8.7–10.3)
Chloride: 104 mmol/L (ref 96–106)
Creatinine, Ser: 0.64 mg/dL (ref 0.57–1.00)
GFR calc Af Amer: 102 mL/min/{1.73_m2} (ref >59)
GFR calc non Af Amer: 88 mL/min/{1.73_m2} (ref >59)
Glucose: 107 mg/dL — ABNORMAL HIGH (ref 65–99)
Potassium: 4 mmol/L (ref 3.5–5.2)
Sodium: 141 mmol/L (ref 134–144)

## 2020-03-18 LAB — MAGNESIUM: Magnesium: 2.2 mg/dL (ref 1.6–2.3)

## 2020-03-18 LAB — TSH: TSH: 2.42 u[IU]/mL (ref 0.450–4.500)

## 2020-03-23 ENCOUNTER — Telehealth: Payer: Self-pay | Admitting: Internal Medicine

## 2020-03-23 NOTE — Telephone Encounter (Signed)
[facility] contacted patient stating that they need more information to fill the eliquis prescription. Patient also states that this medication is going to be very expensive for her and would like a cheaper option if available  Please advise

## 2020-03-24 ENCOUNTER — Ambulatory Visit: Payer: Self-pay | Admitting: Family Medicine

## 2020-03-24 NOTE — Telephone Encounter (Signed)
Spoke to patient. We discussed alternatives to Eliquis being Xarelto or Coumadin. She does not like the sound of the Coumadin.  Discussed that sometimes insurance coverage may prefer Xarelto over Coumadin or vice versa. Advised patient to look into the cost of the Eliquis and also call the Patient assistance foundation to see if she would qualify. She is aware to do that and will. She has the 30-day free supply right now and will keep me posted on her progress so that we can provide her with Eliquis or update plan of care.  She was very Patent attorney.

## 2020-04-01 ENCOUNTER — Ambulatory Visit: Payer: Medicare Other | Admitting: Internal Medicine

## 2020-04-05 DIAGNOSIS — H401131 Primary open-angle glaucoma, bilateral, mild stage: Secondary | ICD-10-CM | POA: Diagnosis not present

## 2020-04-15 ENCOUNTER — Other Ambulatory Visit: Payer: Self-pay | Admitting: Family Medicine

## 2020-04-15 DIAGNOSIS — F419 Anxiety disorder, unspecified: Secondary | ICD-10-CM

## 2020-04-15 NOTE — Telephone Encounter (Signed)
Requested medication (s) are due for refill today: yes  Requested medication (s) are on the active medication list: yes  Last refill:  01/22/2020  Future visit scheduled: yes  Notes to clinic:  this refill cannot be delegated    Requested Prescriptions  Pending Prescriptions Disp Refills   ALPRAZolam (XANAX) 0.5 MG tablet [Pharmacy Med Name: ALPRAZOLAM  0.5MG  TAB] 90 tablet     Sig: TAKE 1 TABLET BY MOUTH AT  BEDTIME      Not Delegated - Psychiatry:  Anxiolytics/Hypnotics Failed - 04/15/2020  8:48 AM      Failed - This refill cannot be delegated      Failed - Urine Drug Screen completed in last 360 days      Passed - Valid encounter within last 6 months    Recent Outpatient Visits           3 months ago Celiac disease   [facility] Jerrol Banana., MD   4 months ago Chronic obstructive pulmonary disease, unspecified COPD type [facility])   [facility] Jerrol Banana., MD   6 months ago Multiple lung nodules on [place] Jerrol Banana., MD   8 months ago Tenderness of right calf   [facility], Dionne Bucy, MD   8 months ago Arthritis of knee   [facility] Trinna Post, [place]       Future Appointments             In 5 days Theora Gianotti, NP [facility], [place]   In 2 months Jerrol Banana., MD [facility], [place]   In 2 months End, Harrell Gave, MD [facility], [place]

## 2020-04-20 ENCOUNTER — Ambulatory Visit: Payer: Medicare Other | Admitting: Nurse Practitioner

## 2020-04-20 DIAGNOSIS — H401131 Primary open-angle glaucoma, bilateral, mild stage: Secondary | ICD-10-CM | POA: Diagnosis not present

## 2020-04-21 ENCOUNTER — Ambulatory Visit: Payer: Medicare Other | Admitting: Nurse Practitioner

## 2020-04-28 ENCOUNTER — Ambulatory Visit: Payer: Medicare Other | Admitting: Nurse Practitioner

## 2020-04-28 ENCOUNTER — Encounter: Payer: Self-pay | Admitting: Nurse Practitioner

## 2020-04-28 ENCOUNTER — Other Ambulatory Visit: Payer: Self-pay

## 2020-04-28 VITALS — BP 126/76 | HR 66 | Ht 63.0 in | Wt 130.0 lb

## 2020-04-28 DIAGNOSIS — E7849 Other hyperlipidemia: Secondary | ICD-10-CM

## 2020-04-28 DIAGNOSIS — I480 Paroxysmal atrial fibrillation: Secondary | ICD-10-CM | POA: Diagnosis not present

## 2020-04-28 DIAGNOSIS — Z79899 Other long term (current) drug therapy: Secondary | ICD-10-CM

## 2020-04-28 DIAGNOSIS — E039 Hypothyroidism, unspecified: Secondary | ICD-10-CM

## 2020-04-28 DIAGNOSIS — I2510 Atherosclerotic heart disease of native coronary artery without angina pectoris: Secondary | ICD-10-CM

## 2020-04-28 DIAGNOSIS — I5032 Chronic diastolic (congestive) heart failure: Secondary | ICD-10-CM

## 2020-04-28 DIAGNOSIS — I10 Essential (primary) hypertension: Secondary | ICD-10-CM

## 2020-04-28 NOTE — Progress Notes (Signed)
Office Visit    Patient Name: Sandra [name] Date of Encounter: 04/28/2020  Primary Care Provider:  Jerrol Banana., MD Primary Cardiologist:  Nelva Bush, MD  Chief Complaint    74 year old female with a history of HFpEF, right-sided breast cancer status post mastectomy without chemotherapy or radiation, celiac disease, Raynaud's disease, hypothyroidism, restless leg syndrome, GERD, anxiety, who presents for follow-up related to paroxysmal atrial fibrillation.  Past Medical History    Past Medical History:  Diagnosis Date  . (HFpEF) heart failure with preserved ejection fraction ([place])    a. 08/2019 Echo: EF 60-65%, no rwma, mild LVH, Gr2 DD, nl RV fxn. Nl pASP. Mildly dil LA. Mild to mod MR.  [name] Anemia   . Anxiety   . Arthritis   . BRCA gene mutation negative 10/2016   NEGATIVE: [employer]  . Breast cancer ([place]) 10/16/2016   T1c,N0;, GRADE I/III, 1.6cm. ER/PR pos  HER2 not over expressed, Right [place]  . Celiac disease   . Depression   . Dyspnea    WITH EXERTION  . GERD (gastroesophageal reflux disease)    OCC  . Glaucoma    right eye  . Headache    H/O MIGRAINES AS TEENAGER  . Heart murmur    a. 08/2019 Echo: mild to mod MR.  [name] Hypertension   . Hypothyroidism   . Non-obstructive CAD (coronary artery disease)    a. 01/2017 MV: Hypertensive response. No ischemia/infarct. EF >65%; b. 09/2019 Cor CTA: Ca2+ = 9.29 (36th percentile). LAD calcified plaque (0-24%), otw nl. Multipel bilat pulm nodules up to 49m.  .Marland KitchenPAF (paroxysmal atrial fibrillation) ([place]    a. 02/2020 Zio: predominantly RSR, 65 (50-105), rare PACs/PVCs, 8 beats NSVT, multiple episodes of PAF lasting up to 1hr 242ms. Avg AF rate 130 (93-170). AF burden <1%. Triggered events = RSR, PACs, and PAF; b. CHA2DS2VASc = 6.  . Pneumonia    YEARS AGO  . Pulmonary nodules    a. 09/2019 Cor CTA: incidental finding of multiple bilat pulm nodules; b. 12/2019 High Res CT: stable, scattered solid pulm nodules.   . Raynaud's disease   . RLS (restless legs syndrome)    Past Surgical History:  Procedure Laterality Date  . ABDOMINAL HYSTERECTOMY    . APPENDECTOMY    . AXILLARY LYMPH NODE BIOPSY Right 03/05/2017   Procedure: AXILLARY LYMPH NODE BIOPSY;  Surgeon: ByRobert BellowMD;  Location: [facility] ORS;  Service: General;  Laterality: Right;  . CATARACT EXTRACTION Bilateral   . CESAREAN SECTION    . COLONOSCOPY WITH PROPOFOL N/A 05/19/2015   Procedure: COLONOSCOPY WITH PROPOFOL;  Surgeon: RoManya SilvasMD;  [facility];  Service: Endoscopy;  Laterality: N/A;  . COLONOSCOPY WITH PROPOFOL N/A 08/01/2019   Procedure: COLONOSCOPY WITH PROPOFOL;  Surgeon: WoLucilla LameMD;  [facility];  Service: Endoscopy;  Laterality: N/A;  . EYE SURGERY    . HAND SURGERY Left   . MASTECTOMY Right 11/07/2016   RESIDUAL INVASIVE MAMMARY CARCINOMA, SUBAREOLAR ANTERIOR TO PREVIOUS   . SENTINEL NODE BIOPSY Right 11/07/2016   Procedure: SENTINEL NODE BIOPSY;  Surgeon: ByRobert BellowMD;  Location: [facility] ORS;  Service: General;  Laterality: Right;  . SIMPLE MASTECTOMY WITH AXILLARY SENTINEL NODE BIOPSY Right 11/07/2016   6 mm ER/PR 100%; Her 2 neu not overexpressed, T1b, N0.  Surgeon: ByRobert BellowMD;  Location: [facility] ORS;  Service: General;  Laterality: Right;  . TONSILLECTOMY  AGE 57  .  UPPER GI ENDOSCOPY  01/12/06   hiatus hernia    Allergies  No Known Allergies  History of Present Illness    74 year old female with the above past medical history including HFpEF, right-sided breast cancer status post mastectomy without chemotherapy or radiation, celiac disease, Raynaud's disease, hypothyroidism, restless leg syndrome, GERD, and anxiety.  She was previous evaluated by Dr. Clayborn Bigness for preoperative screening in the setting of right mastectomy in the summer 2018.  She was found to have an abnormal ECG was subsequently cleared for surgery without additional testing.  She established  with our office in August 2018.  Echocardiogram at that time showed an EF of 60-65% with mild concentric LVH, moderate hypertrophy of the septum with near cavity obliteration with systole, without significant outflow tract gradient grade 2 diastolic dysfunction as well as mild mitral regurgitation were also noted.  She underwent stress testing in September 2018 that showed a hypertensive response to exercise but no significant ischemia or infarct.  In April 2021, she complained of dyspnea on exertion underwent repeat echo showing EF of 60-65%, no regional wall motion abnormalities, mild LVH, grade 2 diastolic dysfunction, and mild to moderate mitral regurgitation.  CT angiography in May 2021 showed a calcium score of 9 (35th percentile), with calcified plaque in the proximal LAD estimated at 0 to 24% with otherwise nonobstructive disease.  She was incidentally noted to have multiple pulmonary nodules throughout bilateral lungs with the largest measuring 7 mm in the right middle lobe and she has since followed with pulmonology.  In late September of this year, she contacted our office secondary to palpitations.  In [place] monitoring was placed and showed multiple episodes of paroxysmal atrial fibrillation lasting up to 1 hour and 25 minutes with an average rate of 130 bpm (93-170).  A. fib burden was less than 1%.  Triggered events corresponded to sinus rhythm, PACs, and PAF.  At follow-up on October 20, she reported no recurrent palpitations and was doing well.  Carvedilol dosing was increased to 6.25 mg twice daily and Eliquis 5 mg twice daily was recommended.  After initially delaying Eliquis start due to cost, she has since been taking it as prescribed and has tolerated well.  She has not had any bleeding that she is aware of.  Though she has occasionally noted a brief palpitation here or there, she has not had any sustained arrhythmias that she is aware of and overall feels as though she is doing well.  She  denies chest pain, dyspnea, PND, orthopnea, dizziness, syncope, or early satiety.  She sometimes notes lower extremity swelling if she eats too much salt.  Home Medications    Prior to Admission medications   Medication Sig Start Date End Date Taking? Authorizing Provider  ALPRAZolam Duanne Moron) 0.5 MG tablet TAKE 1 TABLET BY MOUTH AT  BEDTIME 04/15/20   Jerrol Banana., MD  apixaban [facility]) 5 MG TABS tablet Take 1 tablet (5 mg) by mouth twice daily 03/17/20   Theora Gianotti, NP  atorvastatin (LIPITOR) 40 MG tablet Take 1 tablet (40 mg total) by mouth daily. 12/30/19 03/29/20  Rise Mu, PA-C  Calcium Carbonate (CALCIUM 600 PO) Take 600 mg by mouth daily at 3 pm.     [provider]  carvedilol (COREG) 6.25 MG tablet Take 1 tablet (6.25 mg total) by mouth 2 (two) times daily. 03/17/20   Theora Gianotti, NP  Cholecalciferol (VITAMIN D3) 1000 units CAPS Take 1,000 Units by mouth.  [provider]  dicyclomine (BENTYL) 10 MG capsule TAKE 1 CAPSULE BY MOUTH 3  TIMES DAILY BEFORE MEALS 12/16/19   Jerrol Banana., MD  hydroxypropyl methylcellulose / hypromellose (ISOPTO TEARS / GONIOVISC) 2.5 % ophthalmic solution Place 1 drop into both eyes 4 (four) times daily as needed for dry eyes (scheduled each morning & then as needed).    [provider]  latanoprost (XALATAN) 0.005 % ophthalmic solution INT 1 GTT IN OD 1 TIME IN THE EVE 02/06/17   [provider]  letrozole (FEMARA) 2.5 MG tablet Take 1 tablet (2.5 mg total) by mouth daily. 09/23/18   Robert Bellow, MD  levothyroxine (SYNTHROID) 100 MCG tablet TAKE 1 TABLET BY MOUTH 30  MINUTES BEFORE BREAKFAST 02/01/20   Jerrol Banana., MD  Multiple Vitamin (MULTIVITAMIN WITH MINERALS) TABS tablet Take 1 tablet by mouth daily at 3 pm.    [provider]  omeprazole (PRILOSEC) 40 MG capsule TAKE 1 CAPSULE BY MOUTH  DAILY 09/17/19   Jerrol Banana., MD   sennosides-docusate sodium (SENOKOT-S) 8.6-50 MG tablet Take 1 tablet by mouth as needed for constipation.    [provider]  sertraline (ZOLOFT) 100 MG tablet TAKE 1 TABLET BY MOUTH  DAILY 03/06/20   Jerrol Banana., MD  Simethicone (GAS-X PO) Take 1 tablet by mouth as needed.    [provider]    Review of Systems    Doing well.  Notes her gait is sometimes unsteady.  Denies chest pain, palpitations, dyspnea, PND, orthopnea, dizziness, syncope, edema, or early satiety.  All other systems reviewed and are otherwise negative except as noted above.  Physical Exam    VS:  BP 126/76   Pulse 66   Ht 5' 3"  (1.6 m)   Wt 130 lb (59 kg)   SpO2 98%   BMI 23.03 kg/m  , BMI Body mass index is 23.03 kg/m. GEN: Well nourished, well developed, in no acute distress. HEENT: normal. Neck: Supple, no JVD, carotid bruits, or masses. Cardiac: RRR, 2/6 systolic murmur at the left lower sternal border, no rubs, or gallops. No clubbing, cyanosis, edema.  Radials/PT 2+ and equal bilaterally.  Respiratory:  Respirations regular and unlabored, clear to auscultation bilaterally. GI: Soft, nontender, nondistended, BS + x 4. MS: no deformity or atrophy. Skin: warm and dry, no rash. Neuro:  Strength and sensation are intact. Psych: Normal affect.  Accessory Clinical Findings    ECG personally reviewed by me today -regular sinus rhythm, 60, ? Prior septal infarct, LVH with inferolateral repolarization abnormalities- no acute changes.  Lab Results  Component Value Date   WBC 5.5 11/24/2019   HGB 11.7 11/24/2019   HCT 35.4 11/24/2019   MCV 89 11/24/2019   PLT 238 11/24/2019   Lab Results  Component Value Date   CREATININE 0.64 03/17/2020   BUN 13 03/17/2020   NA 141 03/17/2020   K 4.0 03/17/2020   CL 104 03/17/2020   CO2 25 03/17/2020   Lab Results  Component Value Date   ALT 34 (H) 11/24/2019   AST 25 11/24/2019   ALKPHOS 91 11/24/2019   BILITOT 0.6 11/24/2019    Lab Results  Component Value Date   CHOL 234 (H) 06/30/2019   HDL 58 06/30/2019   LDLCALC 144 (H) 06/30/2019   TRIG 179 (H) 06/30/2019   CHOLHDL 4.0 06/30/2019    Lab Results  Component Value Date   HGBA1C 5.6 05/31/2016   Lab  Results  Component Value Date   TSH 2.420 03/17/2020    Assessment & Plan    1.  Paroxysmal atrial fibrillation: Patient had a prolonged episode of palpitations in late September prompting Zio monitoring which showed paroxysmal atrial fibrillation with an average rate of 130 bpm (93-170).  A. fib burden was less than 1% during monitoring period.  She has not had any recurrent A. fib that she is aware of though occasionally notes a brief palpitation lasting just a second or so.  She has tolerated carvedilol and Eliquis therapy well.  I will follow-up a CBC and basic metabolic panel today as it has been a month since Eliquis initiation.  If stable, would plan to follow-up repeat labs in 6 months.  2.  Essential hypertension: Stable on beta-blocker therapy.  3.  Hyperlipidemia: LDL was 144 in February 2021, at which time she was placed on a statin.  We will follow-up lipids and LFTs today.  4.  Chronic heart failure with preserved ejection fraction: EF 60-65% in April 2021 with nonobstructive LAD disease on coronary CTA in May 2021.  She occasionally notes mild foot or ankle swelling in the setting of increased salt intake but is euvolemic on examination today.  Heart rate blood pressure stable.  5.  Hypothyroidism: On Synthroid.  TSH was normal in October 2021.  6.  Nonobstructive CAD: Moderate, nonobstructive LAD disease noted on coronary CTA earlier this year.  No chest pain.  She remains on statin.  No aspirin in the setting of Eliquis.  7.  Disposition: Follow-up CBC, complete metabolic panel, and lipids today.  Follow-up in clinic in 3 to 4 months or sooner if necessary.   Murray Hodgkins, NP 04/28/2020, 10:02 AM

## 2020-04-28 NOTE — Patient Instructions (Signed)
Medication Instructions:   Your physician recommends that you continue on your current medications as directed. Please refer to the Current Medication list given to you today.  *If you need a refill on your cardiac medications before your next appointment, please call your pharmacy*   Lab Work:  Your physician recommends that you have lab work TODAY:  Bmet / CBC  If you have labs (blood work) drawn today and your tests are completely normal, you will receive your results only by: [name] MyChart Message (if you have MyChart) OR . A paper copy in the mail If you have any lab test that is abnormal or we need to change your treatment, we will call you to review the results.   Testing/Procedures: None ordered   Follow-Up: At [facility], you and your health needs are our priority.  As part of our continuing mission to provide you with exceptional heart care, we have created designated Provider Care Teams.  These Care Teams include your primary Cardiologist (physician) and Advanced Practice Providers (APPs -  Physician Assistants and Nurse Practitioners) who all work together to provide you with the care you need, when you need it.  We recommend signing up for the patient portal called "MyChart".  Sign up information is provided on this After Visit Summary.  MyChart is used to connect with patients for Virtual Visits (Telemedicine).  Patients are able to view lab/test results, encounter notes, upcoming appointments, etc.  Non-urgent messages can be sent to your provider as well.   To learn more about what you can do with MyChart, go to [email or web address].    Your next appointment:   3-4 month(s)  The format for your next appointment:   In Person  Provider:   You may see Nelva Bush, MD or one of the following Advanced Practice Providers on your designated Care Team:    Murray Hodgkins, NP

## 2020-04-29 ENCOUNTER — Telehealth: Payer: Self-pay

## 2020-04-29 ENCOUNTER — Encounter: Payer: Self-pay | Admitting: *Deleted

## 2020-04-29 LAB — LIPID PANEL
Chol/HDL Ratio: 2.7 ratio (ref 0.0–4.4)
Cholesterol, Total: 133 mg/dL (ref 100–199)
HDL: 50 mg/dL (ref >39)
LDL Chol Calc (NIH): 67 mg/dL (ref 0–99)
Triglycerides: 79 mg/dL (ref 0–149)
VLDL Cholesterol Cal: 16 mg/dL (ref 5–40)

## 2020-04-29 LAB — COMPREHENSIVE METABOLIC PANEL
ALT: 22 IU/L (ref 0–32)
AST: 21 IU/L (ref 0–40)
Albumin/Globulin Ratio: 1.9 (ref 1.2–2.2)
Albumin: 4.2 g/dL (ref 3.7–4.7)
Alkaline Phosphatase: 89 IU/L (ref 44–121)
BUN/Creatinine Ratio: 19 (ref 12–28)
BUN: 14 mg/dL (ref 8–27)
Bilirubin Total: 0.6 mg/dL (ref 0.0–1.2)
CO2: 27 mmol/L (ref 20–29)
Calcium: 8.9 mg/dL (ref 8.7–10.3)
Chloride: 104 mmol/L (ref 96–106)
Creatinine, Ser: 0.72 mg/dL (ref 0.57–1.00)
GFR calc Af Amer: 95 mL/min/{1.73_m2} (ref >59)
GFR calc non Af Amer: 82 mL/min/{1.73_m2} (ref >59)
Globulin, Total: 2.2 g/dL (ref 1.5–4.5)
Glucose: 107 mg/dL — ABNORMAL HIGH (ref 65–99)
Potassium: 4.4 mmol/L (ref 3.5–5.2)
Sodium: 140 mmol/L (ref 134–144)
Total Protein: 6.4 g/dL (ref 6.0–8.5)

## 2020-04-29 LAB — CBC
Hematocrit: 34.4 % (ref 34.0–46.6)
Hemoglobin: 11.3 g/dL (ref 11.1–15.9)
MCH: 28.8 pg (ref 26.6–33.0)
MCHC: 32.8 g/dL (ref 31.5–35.7)
MCV: 88 fL (ref 79–97)
Platelets: 251 10*3/uL (ref 150–450)
RBC: 3.93 x10E6/uL (ref 3.77–5.28)
RDW: 13.1 % (ref 11.7–15.4)
WBC: 5.5 10*3/uL (ref 3.4–10.8)

## 2020-04-29 NOTE — Telephone Encounter (Signed)
-----   Message from Rise Mu, [place] sent at 04/29/2020  8:46 AM EST ----- Cholesterol is much improved and at goal. LFT normal. No changes to statin therapy.

## 2020-04-29 NOTE — Telephone Encounter (Signed)
-----   Message from Theora Gianotti, NP sent at 04/29/2020 10:46 AM EST ----- Renal fxn/lytes, blood counts, liver enzymes nl.  Lipids look good.

## 2020-04-29 NOTE — Telephone Encounter (Signed)
Results released to My Chart.  

## 2020-05-03 NOTE — Progress Notes (Signed)
Subjective:   Sandra [name] is a 74 y.o. female who presents for Medicare Annual (Subsequent) preventive examination.  I connected with [employer] today by telephone and verified that I am speaking with the correct person using two identifiers. Location patient: home Location provider: work Persons participating in the virtual visit: patient, provider.   I discussed the limitations, risks, security and privacy concerns of performing an evaluation and management service by telephone and the availability of in person appointments. I also discussed with the patient that there may be a patient responsible charge related to this service. The patient expressed understanding and verbally consented to this telephonic visit.    Interactive audio and video telecommunications were attempted between this provider and patient, however failed, due to patient having technical difficulties OR patient did not have access to video capability.  We continued and completed visit with audio only.   Review of Systems    N/A  Cardiac Risk Factors include: advanced age (>70mn, >>53women);hypertension     Objective:    There were no vitals filed for this visit. There is no height or weight on file to calculate BMI.  Advanced Directives 05/04/2020 08/01/2019 04/29/2019 06/27/2018 04/22/2018 04/18/2017 03/05/2017  Does Patient Have a Medical Advance Directive? Yes Yes Yes No Yes Yes Yes  Type of AParamedicof [place]Living will Living will [place]Living will - [place] will [place]Living will [place]Living will  Copy of [place] Chart? Yes - validated most recent copy scanned in chart (See row information) - Yes - validated most recent copy scanned in chart (See row information) - Yes - validated most recent copy scanned in chart (See row information) No - copy requested No -  copy requested  Would patient like information on creating a medical advance directive? - - - No - Patient declined - - -    Current Medications (verified) Outpatient Encounter Medications as of 05/04/2020  Medication Sig  . ALPRAZolam (XANAX) 0.5 MG tablet TAKE 1 TABLET BY MOUTH AT  BEDTIME  . apixaban (ELIQUIS) 5 MG TABS tablet Take 1 tablet (5 mg) by mouth twice daily  . Calcium Carbonate (CALCIUM 600 PO) Take 600 mg by mouth daily at 3 pm.   . carvedilol (COREG) 6.25 MG tablet Take 12.5 mg by mouth 2 (two) times daily with a meal.  . Cholecalciferol (VITAMIN D3) 1000 units CAPS Take 1,000 Units by mouth.  . hydroxypropyl methylcellulose / hypromellose (ISOPTO TEARS / GONIOVISC) 2.5 % ophthalmic solution Place 1 drop into both eyes 4 (four) times daily as needed for dry eyes (scheduled each morning & then as needed).  . latanoprost (XALATAN) 0.005 % ophthalmic solution INT 1 GTT IN OD 1 TIME IN THE EVE  . letrozole (FEMARA) 2.5 MG tablet Take 1 tablet (2.5 mg total) by mouth daily.  .Marland Kitchenlevothyroxine (SYNTHROID) 100 MCG tablet TAKE 1 TABLET BY MOUTH 30  MINUTES BEFORE BREAKFAST  . Multiple Vitamin (MULTIVITAMIN WITH MINERALS) TABS tablet Take 1 tablet by mouth daily at 3 pm.  . omeprazole (PRILOSEC) 40 MG capsule TAKE 1 CAPSULE BY MOUTH  DAILY  . sennosides-docusate sodium (SENOKOT-S) 8.6-50 MG tablet Take 1 tablet by mouth as needed for constipation.  . sertraline (ZOLOFT) 100 MG tablet TAKE 1 TABLET BY MOUTH  DAILY  . Simethicone (GAS-X PO) Take 1 tablet by mouth as needed.  . timolol (TIMOPTIC) 0.5 % ophthalmic solution  Place 1 drop into both eyes every morning.  [name] atorvastatin (LIPITOR) 40 MG tablet Take 1 tablet (40 mg total) by mouth daily.   No facility-administered encounter medications on file as of 05/04/2020.    Allergies (verified) Patient has no known allergies.   History: Past Medical History:  Diagnosis Date  . (HFpEF) heart failure with preserved ejection fraction  ([place])    a. 08/2019 Echo: EF 60-65%, no rwma, mild LVH, Gr2 DD, nl RV fxn. Nl pASP. Mildly dil LA. Mild to mod MR.  [name] Anemia   . Anxiety   . Arthritis   . BRCA gene mutation negative 10/2016   NEGATIVE: [employer]  . Breast cancer ([place]) 10/16/2016   T1c,N0;, GRADE I/III, 1.6cm. ER/PR pos  HER2 not over expressed, Right Upper Outer  . Celiac disease   . Depression   . Dyspnea    WITH EXERTION  . GERD (gastroesophageal reflux disease)    OCC  . Glaucoma    right eye  . Headache    H/O MIGRAINES AS TEENAGER  . Heart murmur    a. 08/2019 Echo: mild to mod MR.  [name] Hypertension   . Hypothyroidism   . Non-obstructive CAD (coronary artery disease)    a. 01/2017 MV: Hypertensive response. No ischemia/infarct. EF >65%; b. 09/2019 Cor CTA: Ca2+ = 9.29 (36th percentile). LAD calcified plaque (0-24%), otw nl. Multipel bilat pulm nodules up to 12m.  .Marland KitchenPAF (paroxysmal atrial fibrillation) ([place]    a. 02/2020 Zio: predominantly RSR, 65 (50-105), rare PACs/PVCs, 8 beats NSVT, multiple episodes of PAF lasting up to 1hr 276ms. Avg AF rate 130 (93-170). AF burden <1%. Triggered events = RSR, PACs, and PAF; b. CHA2DS2VASc = 6.  . Pneumonia    YEARS AGO  . Pulmonary nodules    a. 09/2019 Cor CTA: incidental finding of multiple bilat pulm nodules; b. 12/2019 High Res CT: stable, scattered solid pulm nodules.  . Raynaud's disease   . RLS (restless legs syndrome)    Past Surgical History:  Procedure Laterality Date  . ABDOMINAL HYSTERECTOMY    . APPENDECTOMY    . AXILLARY LYMPH NODE BIOPSY Right 03/05/2017   Procedure: AXILLARY LYMPH NODE BIOPSY;  Surgeon: ByRobert BellowMD;  Location: [facility] ORS;  Service: General;  Laterality: Right;  . CATARACT EXTRACTION Bilateral   . CESAREAN SECTION    . COLONOSCOPY WITH PROPOFOL N/A 05/19/2015   Procedure: COLONOSCOPY WITH PROPOFOL;  Surgeon: RoManya SilvasMD;  [facility];  Service: Endoscopy;  Laterality: N/A;  . COLONOSCOPY WITH PROPOFOL N/A  08/01/2019   Procedure: COLONOSCOPY WITH PROPOFOL;  Surgeon: WoLucilla LameMD;  [facility];  Service: Endoscopy;  Laterality: N/A;  . EYE SURGERY    . HAND SURGERY Left   . MASTECTOMY Right 11/07/2016   RESIDUAL INVASIVE MAMMARY CARCINOMA, SUBAREOLAR ANTERIOR TO PREVIOUS   . SENTINEL NODE BIOPSY Right 11/07/2016   Procedure: SENTINEL NODE BIOPSY;  Surgeon: ByRobert BellowMD;  Location: [facility] ORS;  Service: General;  Laterality: Right;  . SIMPLE MASTECTOMY WITH AXILLARY SENTINEL NODE BIOPSY Right 11/07/2016   6 mm ER/PR 100%; Her 2 neu not overexpressed, T1b, N0.  Surgeon: ByRobert BellowMD;  Location: [facility] ORS;  Service: General;  Laterality: Right;  . TONSILLECTOMY  AGE 21  . UPPER GI ENDOSCOPY  01/12/06   hiatus hernia   Family History  Problem Relation Age of Onset  . Cancer Mother   . Anemia Mother   . Other  Mother        lymphosarcoma  . Alcohol abuse Father   . Prostate cancer Father   . Breast cancer Sister        14; 1/2 sister, shared mother.  . Cancer Sister        bile duct  . Stroke Maternal Grandmother   . Breast cancer Maternal Grandmother 68  . Heart attack Maternal Grandfather   . CAD Paternal Grandfather    Social History   Socioeconomic History  . Marital status: Married    Spouse name: Not on file  . Number of children: 2  . Years of education: Not on file  . Highest education level: Some college, no degree  Occupational History  . Occupation: retired  Tobacco Use  . Smoking status: Never Smoker  . Smokeless tobacco: Never Used  Vaping Use  . Vaping Use: Never used  Substance and Sexual Activity  . Alcohol use: Not Currently  . Drug use: No  . Sexual activity: Not Currently    Birth control/protection: None  Other Topics Concern  . Not on file  Social History Narrative  . Not on file   Social Determinants of Health   Financial Resource Strain: Low Risk   . Difficulty of Paying Living Expenses: Not hard at all  Food  Insecurity: No Food Insecurity  . Worried About Charity fundraiser in the Last Year: Never true  . Ran Out of Food in the Last Year: Never true  Transportation Needs: No Transportation Needs  . Lack of Transportation (Medical): No  . Lack of Transportation (Non-Medical): No  Physical Activity: Inactive  . Days of Exercise per Week: 0 days  . Minutes of Exercise per Session: 0 min  Stress: No Stress Concern Present  . Feeling of Stress : Only a little  Social Connections: Socially Integrated  . Frequency of Communication with Friends and Family: More than three times a week  . Frequency of Social Gatherings with Friends and Family: More than three times a week  . Attends Religious Services: More than 4 times per year  . Active Member of Clubs or Organizations: Yes  . Attends Archivist Meetings: More than 4 times per year  . Marital Status: Married    Tobacco Counseling Counseling given: Not Answered   Clinical Intake:  Pre-visit preparation completed: Yes  Pain : No/denies pain     Nutritional Risks: None Diabetes: No  How often do you need to have someone help you when you read instructions, pamphlets, or other written materials from your doctor or pharmacy?: 1 - Never  Diabetic? No  Interpreter Needed?: No  Information entered by :: [facility], LPN   Activities of Daily Living In your present state of health, do you have any difficulty performing the following activities: 05/04/2020  Hearing? Y  Comment Wears bilaters hearing aids.  Vision? Y  Comment Has trouble seeing when driving at night.  Difficulty concentrating or making decisions? N  Walking or climbing stairs? Y  Comment Due to SOB.  Dressing or bathing? N  Doing errands, shopping? N  Preparing Food and eating ? N  Using the Toilet? N  In the past six months, have you accidently leaked urine? Y  Comment Occasionally with urges at night.  Do you have problems with loss of bowel control? N   Managing your Medications? N  Managing your Finances? N  Housekeeping or managing your Housekeeping? N  Some recent data might be hidden  Patient Care Team: Jerrol Banana., MD as PCP - General (Family Medicine) End, Harrell Gave, MD as PCP - Cardiology (Cardiology) Leandrew Koyanagi, MD as Referring Physician (Ophthalmology) Bary Castilla, Forest Gleason, MD (General Surgery) Wilhelmina Mcardle., MD (Dentistry) Lucilla Lame, MD as Consulting Physician (Gastroenterology)  Indicate any recent Medical Services you may have received from other than [facility] providers in the past year (date may be approximate).     Assessment:   This is a routine wellness examination for Ambika.  Hearing/Vision screen No exam data present  Dietary issues and exercise activities discussed: Current Exercise Habits: The patient does not participate in regular exercise at present, Exercise limited by: None identified  Goals    . DIET - INCREASE WATER INTAKE     Recommend increasing water intake to 4-6 glasses a day.   04/22/18- Recommend increasing water intake to 6-8 glasses a day.     . Prevent falls     Recommend to remove any items from the home that may cause slips or trips.      Depression Screen PHQ 2/9 Scores 11/24/2019 04/29/2019 04/22/2018 06/06/2017 04/18/2017 04/18/2017 04/07/2016  PHQ - 2 Score 1 0 0 0 1 1 0  PHQ- 9 Score 8 - - 1 2 - -    Fall Risk Fall Risk  05/04/2020 04/29/2019 04/22/2018 06/06/2017 04/18/2017  Falls in the past year? 1 0 0 No No  Number falls in past yr: 1 0 - - -  Injury with Fall? 0 0 - - -  Comment - - - - -  Risk for fall due to : Impaired balance/gait - - - -  Follow up Falls prevention discussed - - - -    FALL RISK PREVENTION PERTAINING TO THE HOME:  Any stairs in or around the home? Yes  If so, are there any without handrails? No  Home free of loose throw rugs in walkways, pet beds, electrical cords, etc? Yes  Adequate lighting in your home to reduce  risk of falls? Yes   ASSISTIVE DEVICES UTILIZED TO PREVENT FALLS:  Life alert? No  Use of a cane, walker or w/c? No  Grab bars in the bathroom? Yes  Shower chair or bench in shower? No  Elevated toilet seat or a handicapped toilet? Yes    Cognitive Function: MMSE - Mini Mental State Exam 11/24/2019  Orientation to time 5  Orientation to Place 5  Registration 3  Attention/ Calculation 5  Recall 3  Language- name 2 objects 2  Language- repeat 1  Language- follow 3 step command 3  Language- read & follow direction 1  Write a sentence 1  Copy design 1  Total score 30     6CIT Screen 04/07/2016  What Year? 0 points  What month? 0 points  What time? 0 points  Count back from 20 0 points  Months in reverse 0 points  Repeat phrase 0 points  Total Score 0    Immunizations Immunization History  Administered Date(s) Administered  . Influenza, High Dose Seasonal PF 03/04/2015, 04/07/2016, 03/16/2017, 02/09/2018, 02/19/2019, 02/20/2020  . PFIZER SARS-COV-2 Vaccination 06/18/2019, 07/12/2019, 01/27/2020  . Pneumococcal Conjugate-13 02/05/2014  . Pneumococcal Polysaccharide-23 04/07/2016  . Tdap 03/18/2008    TDAP status: Due, Education has been provided regarding the importance of this vaccine. Advised may receive this vaccine at local pharmacy or Health Dept. Aware to provide a copy of the vaccination record if obtained from local pharmacy or Health Dept. Verbalized acceptance and understanding.  Flu Vaccine status: Up to date  Pneumococcal vaccine status: Up to date  Covid-19 vaccine status: Completed vaccines  Qualifies for Shingles Vaccine? Yes   Zostavax completed No   Shingrix Completed?: No.    Education has been provided regarding the importance of this vaccine. Patient has been advised to call insurance company to determine out of pocket expense if they have not yet received this vaccine. Advised may also receive vaccine at local pharmacy or Health Dept. Verbalized  acceptance and understanding.  Screening Tests Health Maintenance  Topic Date Due  . TETANUS/TDAP  05/04/2021 (Originally 03/18/2018)  . DEXA SCAN  07/20/2024  . COLONOSCOPY  07/31/2024  . INFLUENZA VACCINE  Completed  . COVID-19 Vaccine  Completed  . Hepatitis C Screening  Completed  . PNA vac Low Risk Adult  Completed    Health Maintenance  There are no preventive care reminders to display for this patient.  Colorectal cancer screening: Type of screening: Colonoscopy. Completed 08/01/19. Repeat every 5 years  Mammogram status: No longer required due to age.  Bone Density status: Completed 07/21/19. Results reflect: Bone density results: OSTEOPENIA. Repeat every 5 years.  Lung Cancer Screening: (Low Dose CT Chest recommended if Age 58-80 years, 30 pack-year currently smoking OR have quit w/in 15years.) does not qualify.   Additional Screening:  Hepatitis C Screening: Up to date  Vision Screening: Recommended annual ophthalmology exams for early detection of glaucoma and other disorders of the eye. Is the patient up to date with their annual eye exam?  Yes  Who is the provider or what is the name of the office in which the patient attends annual eye exams? Dr Wallace Going If pt is not established with a provider, would they like to be referred to a provider to establish care? No .   Dental Screening: Recommended annual dental exams for proper oral hygiene  Community Resource Referral / Chronic Care Management: CRR required this visit?  No   CCM required this visit?  No      Plan:     I have personally reviewed and noted the following in the patient's chart:   . Medical and social history . Use of alcohol, tobacco or illicit drugs  . Current medications and supplements . Functional ability and status . Nutritional status . Physical activity . Advanced directives . List of other physicians . Hospitalizations, surgeries, and ER visits in previous 12  months . Vitals . Screenings to include cognitive, depression, and falls . Referrals and appointments  In addition, I have reviewed and discussed with patient certain preventive protocols, quality metrics, and best practice recommendations. A written personalized care plan for preventive services as well as general preventive health recommendations were provided to patient.      [place], [place]   [id number]    Nurse Notes: None.

## 2020-05-04 ENCOUNTER — Other Ambulatory Visit: Payer: Self-pay

## 2020-05-04 ENCOUNTER — Ambulatory Visit (INDEPENDENT_AMBULATORY_CARE_PROVIDER_SITE_OTHER): Payer: Medicare Other

## 2020-05-04 DIAGNOSIS — Z0000 Encounter for general adult medical examination without abnormal findings: Secondary | ICD-10-CM

## 2020-05-04 NOTE — Patient Instructions (Signed)
Sandra [name] , Thank you for taking time to come for your Medicare Wellness Visit. I appreciate your ongoing commitment to your health goals. Please review the following plan we discussed and let me know if I can assist you in the future.   Screening recommendations/referrals: Colonoscopy: Up to date, due 07/2024 Mammogram: No longer required.  Bone Density: Up to date, due 06/2024 Recommended yearly ophthalmology/optometry visit for glaucoma screening and checkup Recommended yearly dental visit for hygiene and checkup  Vaccinations: Influenza vaccine: Done 02/20/20 Pneumococcal vaccine: Completed series Tdap vaccine: Currently due, declined at this time. Shingles vaccine: Shingrix discussed. Please contact your pharmacy for coverage information.     Advanced directives: Currently on file.  Conditions/risks identified: Fall risk preventatives discussed today. Recommend to increase water intake to 6-8 8 oz glasses a day.   Next appointment: 07/01/20 @ 9:40 AM with Dr Rosanna Randy    Preventive Care 76 Years and Older, Female Preventive care refers to lifestyle choices and visits with your health care provider that can promote health and wellness. What does preventive care include?  A yearly physical exam. This is also called an annual well check.  Dental exams once or twice a year.  Routine eye exams. Ask your health care provider how often you should have your eyes checked.  Personal lifestyle choices, including:  Daily care of your teeth and gums.  Regular physical activity.  Eating a healthy diet.  Avoiding tobacco and drug use.  Limiting alcohol use.  Practicing safe sex.  Taking low-dose aspirin every day.  Taking vitamin and mineral supplements as recommended by your health care provider. What happens during an annual well check? The services and screenings done by your health care provider during your annual well check will depend on your age, overall health, lifestyle  risk factors, and family history of disease. Counseling  Your health care provider may ask you questions about your:  Alcohol use.  Tobacco use.  Drug use.  Emotional well-being.  Home and relationship well-being.  Sexual activity.  Eating habits.  History of falls.  Memory and ability to understand (cognition).  Work and work Statistician.  Reproductive health. Screening  You may have the following tests or measurements:  Height, weight, and BMI.  Blood pressure.  Lipid and cholesterol levels. These may be checked every 5 years, or more frequently if you are over 41 years old.  Skin check.  Lung cancer screening. You may have this screening every year starting at age 61 if you have a 30-pack-year history of smoking and currently smoke or have quit within the past 15 years.  Fecal occult blood test (FOBT) of the stool. You may have this test every year starting at age 80.  Flexible sigmoidoscopy or colonoscopy. You may have a sigmoidoscopy every 5 years or a colonoscopy every 10 years starting at age 75.  Hepatitis C blood test.  Hepatitis B blood test.  Sexually transmitted disease (STD) testing.  Diabetes screening. This is done by checking your blood sugar (glucose) after you have not eaten for a while (fasting). You may have this done every 1-3 years.  Bone density scan. This is done to screen for osteoporosis. You may have this done starting at age 88.  Mammogram. This may be done every 1-2 years. Talk to your health care provider about how often you should have regular mammograms. Talk with your health care provider about your test results, treatment options, and if necessary, the need for more tests. Vaccines  Your health care provider may recommend certain vaccines, such as:  Influenza vaccine. This is recommended every year.  Tetanus, diphtheria, and acellular pertussis (Tdap, Td) vaccine. You may need a Td booster every 10 years.  Zoster vaccine.  You may need this after age 24.  Pneumococcal 13-valent conjugate (PCV13) vaccine. One dose is recommended after age 26.  Pneumococcal polysaccharide (PPSV23) vaccine. One dose is recommended after age 69. Talk to your health care provider about which screenings and vaccines you need and how often you need them. This information is not intended to replace advice given to you by your health care provider. Make sure you discuss any questions you have with your health care provider. Document Released: 06/11/2015 Document Revised: 02/02/2016 Document Reviewed: 03/16/2015 Elsevier Interactive Patient Education  2017 [place] Prevention in the Home Falls can cause injuries. They can happen to people of all ages. There are many things you can do to make your home safe and to help prevent falls. What can I do on the outside of my home?  Regularly fix the edges of walkways and driveways and fix any cracks.  Remove anything that might make you trip as you walk through a door, such as a raised step or threshold.  Trim any bushes or trees on the path to your home.  Use bright outdoor lighting.  Clear any walking paths of anything that might make someone trip, such as rocks or tools.  Regularly check to see if handrails are loose or broken. Make sure that both sides of any steps have handrails.  Any raised decks and porches should have guardrails on the edges.  Have any leaves, snow, or ice cleared regularly.  Use sand or salt on walking paths during winter.  Clean up any spills in your garage right away. This includes oil or grease spills. What can I do in the bathroom?  Use night lights.  Install grab bars by the toilet and in the tub and shower. Do not use towel bars as grab bars.  Use non-skid mats or decals in the tub or shower.  If you need to sit down in the shower, use a plastic, non-slip stool.  Keep the floor dry. Clean up any water that spills on the floor as soon  as it happens.  Remove soap buildup in the tub or shower regularly.  Attach bath mats securely with double-sided non-slip rug tape.  Do not have throw rugs and other things on the floor that can make you trip. What can I do in the bedroom?  Use night lights.  Make sure that you have a light by your bed that is easy to reach.  Do not use any sheets or blankets that are too big for your bed. They should not hang down onto the floor.  Have a firm chair that has side arms. You can use this for support while you get dressed.  Do not have throw rugs and other things on the floor that can make you trip. What can I do in the kitchen?  Clean up any spills right away.  Avoid walking on wet floors.  Keep items that you use a lot in easy-to-reach places.  If you need to reach something above you, use a strong step stool that has a grab bar.  Keep electrical cords out of the way.  Do not use floor polish or wax that makes floors slippery. If you must use wax, use non-skid floor wax.  Do  not have throw rugs and other things on the floor that can make you trip. What can I do with my stairs?  Do not leave any items on the stairs.  Make sure that there are handrails on both sides of the stairs and use them. Fix handrails that are broken or loose. Make sure that handrails are as long as the stairways.  Check any carpeting to make sure that it is firmly attached to the stairs. Fix any carpet that is loose or worn.  Avoid having throw rugs at the top or bottom of the stairs. If you do have throw rugs, attach them to the floor with carpet tape.  Make sure that you have a light switch at the top of the stairs and the bottom of the stairs. If you do not have them, ask someone to add them for you. What else can I do to help prevent falls?  Wear shoes that:  Do not have high heels.  Have rubber bottoms.  Are comfortable and fit you well.  Are closed at the toe. Do not wear sandals.  If  you use a stepladder:  Make sure that it is fully opened. Do not climb a closed stepladder.  Make sure that both sides of the stepladder are locked into place.  Ask someone to hold it for you, if possible.  Clearly mark and make sure that you can see:  Any grab bars or handrails.  First and last steps.  Where the edge of each step is.  Use tools that help you move around (mobility aids) if they are needed. These include:  Canes.  Walkers.  Scooters.  Crutches.  Turn on the lights when you go into a dark area. Replace any light bulbs as soon as they burn out.  Set up your furniture so you have a clear path. Avoid moving your furniture around.  If any of your floors are uneven, fix them.  If there are any pets around you, be aware of where they are.  Review your medicines with your doctor. Some medicines can make you feel dizzy. This can increase your chance of falling. Ask your doctor what other things that you can do to help prevent falls. This information is not intended to replace advice given to you by your health care provider. Make sure you discuss any questions you have with your health care provider. Document Released: 03/11/2009 Document Revised: 10/21/2015 Document Reviewed: 06/19/2014 Elsevier Interactive Patient Education  2017 [employer].

## 2020-06-10 ENCOUNTER — Other Ambulatory Visit: Payer: Self-pay | Admitting: Family Medicine

## 2020-06-11 ENCOUNTER — Telehealth: Payer: Self-pay

## 2020-06-11 DIAGNOSIS — Z03818 Encounter for observation for suspected exposure to other biological agents ruled out: Secondary | ICD-10-CM | POA: Diagnosis not present

## 2020-06-11 DIAGNOSIS — Z20822 Contact with and (suspected) exposure to covid-19: Secondary | ICD-10-CM | POA: Diagnosis not present

## 2020-06-11 NOTE — Telephone Encounter (Signed)
Patient reports she had a COVID test done today. Patient is also taking Mucinex for congestion and OTC vitamin c and D. Patient advised to go to ER or UC if needed.

## 2020-06-11 NOTE — Telephone Encounter (Signed)
Copied from [name] [id number]. Topic: General - Other >> Jun 11, 2020  8:10 AM Sandra [name] wrote: PT need a covid test / she's having all symptoms / please advise

## 2020-06-22 ENCOUNTER — Telehealth (INDEPENDENT_AMBULATORY_CARE_PROVIDER_SITE_OTHER): Payer: Medicare Other | Admitting: Family Medicine

## 2020-06-22 DIAGNOSIS — J0140 Acute pansinusitis, unspecified: Secondary | ICD-10-CM | POA: Diagnosis not present

## 2020-06-22 DIAGNOSIS — U071 COVID-19: Secondary | ICD-10-CM

## 2020-06-22 MED ORDER — CEFDINIR 300 MG PO CAPS
300.0000 mg | ORAL_CAPSULE | Freq: Two times a day (BID) | ORAL | 0 refills | Status: DC
Start: 2020-06-22 — End: 2020-07-16

## 2020-06-22 NOTE — Progress Notes (Signed)
MyChart Video Visit    Virtual Visit via Video Note   This visit type was conducted due to national recommendations for restrictions regarding the COVID-19 Pandemic (e.g. social distancing) in an effort to limit this patient's exposure and mitigate transmission in our community. This patient is at least at moderate risk for complications without adequate follow up. This format is felt to be most appropriate for this patient at this time. Physical exam was limited by quality of the video and audio technology used for the visit.   Patient location: home Provider location: office  I discussed the limitations of evaluation and management by telemedicine and the availability of in person appointments. The patient expressed understanding and agreed to proceed.  Patient: Sandra [name]   DOB: 06/05/1945   75 y.o. Female  MRN: [id number] Visit Date: 06/22/2020  Today's healthcare provider: [name], MD   Chief Complaint  Patient presents with  . Covid Positive   Subjective    Sinusitis This is a new problem. The problem has been gradually improving (Pt states she tested positive Covid 06/11/2020.  Pt says she feeling better overall but now feels like she has a sinus infection. ) since onset. Associated symptoms include congestion, coughing, sinus pressure and sneezing. Pertinent negatives include no chills, diaphoresis, ear pain, headaches, shortness of breath or sore throat.   Pt has had covid booster. Tested positive for Covid on 06/11/2020. No SOB or fever,sore throat.HA resolved. Head congestion only symptom left.  Patient states this feels like the sinusitis that she has had over the years. Patient Active Problem List   Diagnosis Date Noted  . Personal history of colonic polyps   . Chronic diastolic heart failure ([place]) 05/17/2017  . Essential hypertension 05/17/2017  . Glaucoma (increased eye pressure) 02/06/2017  . Invasive ductal carcinoma of breast, female, right  ([place]) 10/20/2016  . Prolapse of vaginal vault after hysterectomy 08/24/2016  . Vaginal atrophy 08/24/2016  . Dyspareunia in female 08/24/2016  . Colon polyps 01/06/2015  . BCC (basal cell carcinoma of skin) 01/06/2015  . Hypothyroidism 01/06/2015  . Vitamin D deficiency 01/06/2015  . Hyperlipidemia 01/06/2015  . Depression, major 01/06/2015  . GAD (generalized anxiety disorder) 01/06/2015  . RLS (restless legs syndrome) 01/06/2015  . Cataract 01/06/2015  . Raynaud phenomenon 01/06/2015  . GERD (gastroesophageal reflux disease) 01/06/2015  . Celiac disease 01/06/2015  . OA (osteoarthritis) 01/06/2015  . Osteoporosis 01/06/2015  . Pre-diabetes 01/06/2015  . Avitaminosis D 01/06/2015  . Raynaud's syndrome without gangrene 01/06/2015  . Age-related osteoporosis without current pathological fracture 01/06/2015  . Anxiety disorder 01/05/2015   Past Medical History:  Diagnosis Date  . (HFpEF) heart failure with preserved ejection fraction ([place])    a. 08/2019 Echo: EF 60-65%, no rwma, mild LVH, Gr2 DD, nl RV fxn. Nl pASP. Mildly dil LA. Mild to mod MR.  [name] Anemia   . Anxiety   . Arthritis   . BRCA gene mutation negative 10/2016   NEGATIVE: [employer]  . Breast cancer ([place]) 10/16/2016   T1c,N0;, GRADE I/III, 1.6cm. ER/PR pos  HER2 not over expressed, Right Upper Outer  . Celiac disease   . Depression   . Dyspnea    WITH EXERTION  . GERD (gastroesophageal reflux disease)    OCC  . Glaucoma    right eye  . Headache    H/O MIGRAINES AS TEENAGER  . Heart murmur    a. 08/2019 Echo: mild to mod MR.  [name] Hypertension   .  Hypothyroidism   . Non-obstructive CAD (coronary artery disease)    a. 01/2017 MV: Hypertensive response. No ischemia/infarct. EF >65%; b. 09/2019 Cor CTA: Ca2+ = 9.29 (36th percentile). LAD calcified plaque (0-24%), otw nl. Multipel bilat pulm nodules up to 39m.  .Marland KitchenPAF (paroxysmal atrial fibrillation) ([place]    a. 02/2020 [facility]: predominantly RSR, 65 (50-105), rare PACs/PVCs,  8 beats NSVT, multiple episodes of PAF lasting up to 1hr 278ms. Avg AF rate 130 (93-170). AF burden <1%. Triggered events = RSR, PACs, and PAF; b. CHA2DS2VASc = 6.  . Pneumonia    YEARS AGO  . Pulmonary nodules    a. 09/2019 Cor CTA: incidental finding of multiple bilat pulm nodules; b. 12/2019 High Res CT: stable, scattered solid pulm nodules.  . Raynaud's disease   . RLS (restless legs syndrome)    Social History   Tobacco Use  . Smoking status: Never Smoker  . Smokeless tobacco: Never Used  Vaping Use  . Vaping Use: Never used  Substance Use Topics  . Alcohol use: Not Currently  . Drug use: No   No Known Allergies  Medications: Outpatient Medications Prior to Visit  Medication Sig  . ALPRAZolam (XANAX) 0.5 MG tablet TAKE 1 TABLET BY MOUTH AT  BEDTIME  . apixaban (ELIQUIS) 5 MG TABS tablet Take 1 tablet (5 mg) by mouth twice daily  . Calcium Carbonate (CALCIUM 600 PO) Take 600 mg by mouth daily at 3 pm.   . carvedilol (COREG) 3.125 MG tablet TAKE 1 TABLET BY MOUTH  TWICE DAILY  . carvedilol (COREG) 6.25 MG tablet Take 12.5 mg by mouth 2 (two) times daily with a meal.  . Cholecalciferol (VITAMIN D3) 1000 units CAPS Take 1,000 Units by mouth.  . hydroxypropyl methylcellulose / hypromellose (ISOPTO TEARS / GONIOVISC) 2.5 % ophthalmic solution Place 1 drop into both eyes 4 (four) times daily as needed for dry eyes (scheduled each morning & then as needed).  . latanoprost (XALATAN) 0.005 % ophthalmic solution INT 1 GTT IN OD 1 TIME IN THE EVE  . letrozole (FEMARA) 2.5 MG tablet Take 1 tablet (2.5 mg total) by mouth daily.  . Marland Kitchenevothyroxine (SYNTHROID) 100 MCG tablet TAKE 1 TABLET BY MOUTH 30  MINUTES BEFORE BREAKFAST  . Multiple Vitamin (MULTIVITAMIN WITH MINERALS) TABS tablet Take 1 tablet by mouth daily at 3 pm.  . omeprazole (PRILOSEC) 40 MG capsule TAKE 1 CAPSULE BY MOUTH  DAILY  . sennosides-docusate sodium (SENOKOT-S) 8.6-50 MG tablet Take 1 tablet by mouth as needed for  constipation.  . sertraline (ZOLOFT) 100 MG tablet TAKE 1 TABLET BY MOUTH  DAILY  . Simethicone (GAS-X PO) Take 1 tablet by mouth as needed.  . timolol (TIMOPTIC) 0.5 % ophthalmic solution Place 1 drop into both eyes every morning.  . Marland Kitchentorvastatin (LIPITOR) 40 MG tablet Take 1 tablet (40 mg total) by mouth daily.   No facility-administered medications prior to visit.    Review of Systems  Constitutional: Positive for fatigue. Negative for activity change, appetite change, chills, diaphoresis, fever and unexpected weight change.  HENT: Positive for congestion, postnasal drip, sinus pressure, sinus pain and sneezing. Negative for ear discharge, ear pain, rhinorrhea, sore throat, tinnitus and trouble swallowing.   Eyes: Negative.   Respiratory: Positive for cough. Negative for apnea, choking, chest tightness, shortness of breath, wheezing and stridor.   Gastrointestinal: Negative.   Neurological: Negative for dizziness, light-headedness and headaches.      Objective    There were no vitals taken for  this visit.   Physical Exam  Patient alert and oriented.  He is breathing well and has no complaints.  She does not even have nasal congestion that I can discern on the telephone call.   Assessment & Plan     1. COVID Diagnoses with 11 days ago.  Patient clinically should be over this.  Told her to wear a mask when she goes in public for the next week.  2. Subacute pansinusitis With Omnicef.  Push fluids and use Mucinex twice a day.  ENT referral if she does not improve   No follow-ups on file.     I discussed the assessment and treatment plan with the patient. The patient was provided an opportunity to ask questions and all were answered. The patient agreed with the plan and demonstrated an understanding of the instructions.   The patient was advised to call back or seek an in-person evaluation if the symptoms worsen or if the condition fails to improve as anticipated.  I  provided 12 minutes of non-face-to-face time during this encounter.    Paulyne Mooty Cranford Mon, MD [facility] [phone] (phone) [phone] (fax)  [place]

## 2020-06-29 DIAGNOSIS — I639 Cerebral infarction, unspecified: Secondary | ICD-10-CM

## 2020-06-29 HISTORY — DX: Cerebral infarction, unspecified: I63.9

## 2020-06-30 ENCOUNTER — Telehealth: Payer: Self-pay

## 2020-06-30 ENCOUNTER — Telehealth: Payer: Medicare Other | Admitting: Family Medicine

## 2020-06-30 NOTE — Telephone Encounter (Signed)
Copied from [place] [id number]. Topic: Quick Communication - See Telephone Encounter >> Jun 30, 2020 10:06 AM Loma Boston wrote: CRM for notification. See Telephone encounter for: 06/30/20.carvedilol (COREG) 3.125 MG tablet 180 tablet 0 06/10/2020   Sig: TAKE 1 TABLET BY MOUTH TWICE DAILY  Sent to pharmacy as: carvedilol (COREG) 3.125 MG tablet  Notes to Pharmacy: Requesting 1 year supply  E-Prescribing Status: Receipt confirmed by pharmacy (06/10/2020 9:46 PM EST)  carvedilol (COREG) 6.25 MG tablet      Sig - Route: Take 12.5 mg by mouth 2 (two) times daily with a meal. - Oral  Class: Historical Med   Please give a call back to pt to advise. This was most recent from Dr. Rosanna Randy  for 2 times at the 6.25 which is what pt is taking. HOWEVER she was taken 3.125 twice for total of 6.25 per day. Noone has had discussion of increase with her. Please advise pt as she has really double this med dosage Please advise correct dosage. FU [phone]

## 2020-07-01 ENCOUNTER — Other Ambulatory Visit: Payer: Self-pay

## 2020-07-01 ENCOUNTER — Encounter: Payer: Self-pay | Admitting: Family Medicine

## 2020-07-01 MED ORDER — CARVEDILOL 6.25 MG PO TABS: 6.2500 mg | ORAL_TABLET | Freq: Two times a day (BID) | ORAL | 0 refills | Status: DC

## 2020-07-01 NOTE — Progress Notes (Signed)
Patient called to inquire about a possible medication error.  Patient's medication list had Carvedilol 3.125 mg twice daily.  She was seen by her cardiologist in October she was told increase her Carvedilol to 6.25 mg twice daily.  Because she had so many of the 3.125 mg she was instructed to take 2 of them twice daily.  However, when the prescription was documented it was somehow changed to 6.25 mg 2 twice daily.  Patient states she has never taken that amount and per the October and December notes from her cardiologist she should not be on that dose.   Correction to the medication list has been made to reflect what was in the office note and what the patient is actually taking.

## 2020-07-05 ENCOUNTER — Other Ambulatory Visit: Payer: Self-pay | Admitting: Family Medicine

## 2020-07-05 DIAGNOSIS — F419 Anxiety disorder, unspecified: Secondary | ICD-10-CM

## 2020-07-05 NOTE — Telephone Encounter (Signed)
Requested medication (s) are due for refill today: Due 07/16/20  Requested medication (s) are on the active medication list: yes  Last refill: 04/15/20  #90  0 refills  Future visit scheduled yes  10/04/20  Notes to clinic: not delegated  Requested Prescriptions  Pending Prescriptions Disp Refills   ALPRAZolam (XANAX) 0.5 MG tablet [Pharmacy Med Name: ALPRAZolam 0.5 MG Oral Tablet] 90 tablet     Sig: TAKE 1 TABLET BY MOUTH AT  BEDTIME      Not Delegated - Psychiatry:  Anxiolytics/Hypnotics Failed - 07/05/2020 11:34 AM      Failed - This refill cannot be delegated      Failed - Urine Drug Screen completed in last 360 days      Passed - Valid encounter within last 6 months    Recent Outpatient Visits           1 week ago [place] Jerrol Banana., MD   6 months ago Celiac disease   [facility] Jerrol Banana., MD   7 months ago Chronic obstructive pulmonary disease, unspecified COPD type [facility])   [facility] Jerrol Banana., MD   8 months ago Multiple lung nodules on [place] Jerrol Banana., MD   10 months ago Tenderness of right calf   [facility] [place], Dionne Bucy, MD       Future Appointments             In 1 month End, Harrell Gave, MD [facility]   In 3 months Jerrol Banana., MD [facility], [place]

## 2020-07-07 ENCOUNTER — Ambulatory Visit: Payer: Medicare Other | Admitting: Internal Medicine

## 2020-07-07 ENCOUNTER — Telehealth: Payer: Self-pay | Admitting: Internal Medicine

## 2020-07-07 NOTE — Telephone Encounter (Signed)
Patient came by office  Has questions in regards to Eliquis and carvedilol medications  Please call to discuss

## 2020-07-08 NOTE — Telephone Encounter (Signed)
Attempted to call pt, unable to leave VM, has not been set up

## 2020-07-09 NOTE — Telephone Encounter (Signed)
Patient calling States she had an afib episode last night that lasted a long time  First one she has had in a while  EMT came out but she did not accept transportation to hospital so they told her to follow up with cardiologist Please call to discuss

## 2020-07-09 NOTE — Telephone Encounter (Signed)
Tried to call patient. Patient picked up and then hung up.  Patient called back. Patient stated she woke up in the middle of the night with A. FIB. Called EMS. EMS told patient she was in A. FIB advised to take to Hospital or see if her HR comes down. Patient BP 140/70 and HR 134. Patient stated at the time she had a bad headache. Patient stated she feels okay right now, but afraid it might come back. Informed patient to go to the ED if she goes back into A. FIB that they would possibly cardiovert her. Encouraged patient to continue her eliquis and not to skip any doses. Made patient an appointment for next week to see NP. Patient agreed to plan.

## 2020-07-14 ENCOUNTER — Telehealth: Payer: Self-pay | Admitting: *Deleted

## 2020-07-14 NOTE — Telephone Encounter (Signed)
STAT if patient feels like he/she is going to faint   1) Are you dizzy now? Off and on dizziness  2) Do you feel faint or have you passed out? Increased dizziness  3) Do you have any other symptoms? Terrible headache  4) Have you checked your HR and BP (record if available)? No  Pt has f/u appointment 07/16/20 with Sharolyn Douglas. Pt mentioned that her symptoms are getting worse and would like to see if she could be seen sooner.

## 2020-07-15 ENCOUNTER — Inpatient Hospital Stay
Admission: AD | Admit: 2020-07-15 | Payer: Medicare Other | Source: Other Acute Inpatient Hospital | Admitting: Family Medicine

## 2020-07-15 ENCOUNTER — Other Ambulatory Visit: Payer: Self-pay

## 2020-07-15 ENCOUNTER — Emergency Department: Payer: Medicare Other

## 2020-07-15 ENCOUNTER — Inpatient Hospital Stay
Admission: EM | Admit: 2020-07-15 | Discharge: 2020-07-18 | DRG: 065 | Disposition: A | Discharge status: Home / Self Care | Payer: Medicare Other | Attending: Family Medicine | Admitting: Family Medicine

## 2020-07-15 DIAGNOSIS — Z853 Personal history of malignant neoplasm of breast: Secondary | ICD-10-CM

## 2020-07-15 DIAGNOSIS — Z7901 Long term (current) use of anticoagulants: Secondary | ICD-10-CM | POA: Diagnosis not present

## 2020-07-15 DIAGNOSIS — I5033 Acute on chronic diastolic (congestive) heart failure: Secondary | ICD-10-CM | POA: Diagnosis present

## 2020-07-15 DIAGNOSIS — Z79890 Hormone replacement therapy (postmenopausal): Secondary | ICD-10-CM

## 2020-07-15 DIAGNOSIS — Z803 Family history of malignant neoplasm of breast: Secondary | ICD-10-CM

## 2020-07-15 DIAGNOSIS — R4701 Aphasia: Secondary | ICD-10-CM

## 2020-07-15 DIAGNOSIS — Z9011 Acquired absence of right breast and nipple: Secondary | ICD-10-CM

## 2020-07-15 DIAGNOSIS — I480 Paroxysmal atrial fibrillation: Secondary | ICD-10-CM | POA: Diagnosis present

## 2020-07-15 DIAGNOSIS — Z823 Family history of stroke: Secondary | ICD-10-CM | POA: Diagnosis not present

## 2020-07-15 DIAGNOSIS — R7303 Prediabetes: Secondary | ICD-10-CM | POA: Diagnosis not present

## 2020-07-15 DIAGNOSIS — H409 Unspecified glaucoma: Secondary | ICD-10-CM | POA: Diagnosis present

## 2020-07-15 DIAGNOSIS — Z20822 Contact with and (suspected) exposure to covid-19: Secondary | ICD-10-CM | POA: Diagnosis present

## 2020-07-15 DIAGNOSIS — I10 Essential (primary) hypertension: Secondary | ICD-10-CM | POA: Diagnosis not present

## 2020-07-15 DIAGNOSIS — I5032 Chronic diastolic (congestive) heart failure: Secondary | ICD-10-CM | POA: Diagnosis present

## 2020-07-15 DIAGNOSIS — Z79899 Other long term (current) drug therapy: Secondary | ICD-10-CM | POA: Diagnosis not present

## 2020-07-15 DIAGNOSIS — K219 Gastro-esophageal reflux disease without esophagitis: Secondary | ICD-10-CM | POA: Diagnosis present

## 2020-07-15 DIAGNOSIS — K900 Celiac disease: Secondary | ICD-10-CM | POA: Diagnosis not present

## 2020-07-15 DIAGNOSIS — E039 Hypothyroidism, unspecified: Secondary | ICD-10-CM | POA: Diagnosis present

## 2020-07-15 DIAGNOSIS — I110 Hypertensive heart disease with heart failure: Secondary | ICD-10-CM | POA: Diagnosis not present

## 2020-07-15 DIAGNOSIS — I639 Cerebral infarction, unspecified: Secondary | ICD-10-CM | POA: Diagnosis not present

## 2020-07-15 DIAGNOSIS — I4891 Unspecified atrial fibrillation: Secondary | ICD-10-CM | POA: Diagnosis not present

## 2020-07-15 DIAGNOSIS — R2689 Other abnormalities of gait and mobility: Secondary | ICD-10-CM

## 2020-07-15 DIAGNOSIS — C50911 Malignant neoplasm of unspecified site of right female breast: Secondary | ICD-10-CM | POA: Diagnosis present

## 2020-07-15 DIAGNOSIS — F329 Major depressive disorder, single episode, unspecified: Secondary | ICD-10-CM | POA: Diagnosis not present

## 2020-07-15 DIAGNOSIS — Z8249 Family history of ischemic heart disease and other diseases of the circulatory system: Secondary | ICD-10-CM | POA: Diagnosis not present

## 2020-07-15 DIAGNOSIS — D649 Anemia, unspecified: Secondary | ICD-10-CM | POA: Diagnosis not present

## 2020-07-15 DIAGNOSIS — F419 Anxiety disorder, unspecified: Secondary | ICD-10-CM | POA: Diagnosis present

## 2020-07-15 DIAGNOSIS — I614 Nontraumatic intracerebral hemorrhage in cerebellum: Secondary | ICD-10-CM | POA: Diagnosis present

## 2020-07-15 DIAGNOSIS — Z79811 Long term (current) use of aromatase inhibitors: Secondary | ICD-10-CM | POA: Diagnosis not present

## 2020-07-15 DIAGNOSIS — R001 Bradycardia, unspecified: Secondary | ICD-10-CM | POA: Diagnosis not present

## 2020-07-15 DIAGNOSIS — I2510 Atherosclerotic heart disease of native coronary artery without angina pectoris: Secondary | ICD-10-CM | POA: Diagnosis present

## 2020-07-15 DIAGNOSIS — R42 Dizziness and giddiness: Secondary | ICD-10-CM | POA: Diagnosis not present

## 2020-07-15 DIAGNOSIS — G2581 Restless legs syndrome: Secondary | ICD-10-CM | POA: Diagnosis not present

## 2020-07-15 DIAGNOSIS — I619 Nontraumatic intracerebral hemorrhage, unspecified: Secondary | ICD-10-CM | POA: Diagnosis not present

## 2020-07-15 DIAGNOSIS — Z8042 Family history of malignant neoplasm of prostate: Secondary | ICD-10-CM

## 2020-07-15 DIAGNOSIS — R269 Unspecified abnormalities of gait and mobility: Secondary | ICD-10-CM | POA: Diagnosis not present

## 2020-07-15 DIAGNOSIS — G936 Cerebral edema: Secondary | ICD-10-CM | POA: Diagnosis not present

## 2020-07-15 DIAGNOSIS — I616 Nontraumatic intracerebral hemorrhage, multiple localized: Secondary | ICD-10-CM | POA: Diagnosis not present

## 2020-07-15 LAB — TYPE AND SCREEN
ABO/RH(D): B POS
Antibody Screen: NEGATIVE

## 2020-07-15 LAB — BASIC METABOLIC PANEL
Anion gap: 9 (ref 5–15)
BUN: 16 mg/dL (ref 8–23)
CO2: 27 mmol/L (ref 22–32)
Calcium: 8.9 mg/dL (ref 8.9–10.3)
Chloride: 103 mmol/L (ref 98–111)
Creatinine, Ser: 0.68 mg/dL (ref 0.44–1.00)
GFR, Estimated: >60 mL/min (ref >60)
Glucose, Bld: 119 mg/dL — ABNORMAL HIGH (ref 70–99)
Potassium: 3.5 mmol/L (ref 3.5–5.1)
Sodium: 139 mmol/L (ref 135–145)

## 2020-07-15 LAB — URINALYSIS, COMPLETE (UACMP) WITH MICROSCOPIC
Bacteria, UA: NONE SEEN
Bilirubin Urine: NEGATIVE
Glucose, UA: NEGATIVE mg/dL
Hgb urine dipstick: NEGATIVE
Ketones, ur: NEGATIVE mg/dL
Leukocytes,Ua: NEGATIVE
Nitrite: NEGATIVE
Protein, ur: NEGATIVE mg/dL
Specific Gravity, Urine: 1.003 — ABNORMAL LOW (ref 1.005–1.030)
Squamous Epithelial / HPF: NONE SEEN (ref 0–5)
WBC, UA: NONE SEEN WBC/hpf (ref 0–5)
pH: 7 (ref 5.0–8.0)

## 2020-07-15 LAB — CBC
HCT: 35.6 % — ABNORMAL LOW (ref 36.0–46.0)
Hemoglobin: 11.4 g/dL — ABNORMAL LOW (ref 12.0–15.0)
MCH: 28.5 pg (ref 26.0–34.0)
MCHC: 32 g/dL (ref 30.0–36.0)
MCV: 89 fL (ref 80.0–100.0)
Platelets: 210 10*3/uL (ref 150–400)
RBC: 4 MIL/uL (ref 3.87–5.11)
RDW: 13.4 % (ref 11.5–15.5)
WBC: 5.7 10*3/uL (ref 4.0–10.5)
nRBC: 0 % (ref 0.0–0.2)

## 2020-07-15 LAB — PROTIME-INR
INR: 1.4 — ABNORMAL HIGH (ref 0.8–1.2)
Prothrombin Time: 16.4 seconds — ABNORMAL HIGH (ref 11.4–15.2)

## 2020-07-15 LAB — RESP PANEL BY RT-PCR (FLU A&B, COVID) ARPGX2
Influenza A by PCR: NEGATIVE
Influenza B by PCR: NEGATIVE
SARS Coronavirus 2 by RT PCR: NEGATIVE

## 2020-07-15 MED ORDER — NICARDIPINE HCL IN NACL 20-0.86 MG/200ML-% IV SOLN
3.0000 mg/h | INTRAVENOUS | Status: DC
  Administered 2020-07-15 (×2): 7.5 mg/h via INTRAVENOUS
  Administered 2020-07-15: 5 mg/h via INTRAVENOUS
  Administered 2020-07-16 (×4): 7.5 mg/h via INTRAVENOUS
  Filled 2020-07-15 (×7): qty 200

## 2020-07-15 MED ORDER — PROTHROMBIN COMPLEX CONC HUMAN 500 UNITS IV KIT
3177.0000 [IU] | PACK | Status: AC
  Administered 2020-07-15: 3177 [IU] via INTRAVENOUS
  Filled 2020-07-15: qty 3177

## 2020-07-15 MED ORDER — ACETAMINOPHEN 325 MG PO TABS
650.0000 mg | ORAL_TABLET | Freq: Once | ORAL | Status: AC
  Administered 2020-07-15: 650 mg via ORAL
  Filled 2020-07-15: qty 2

## 2020-07-15 NOTE — Telephone Encounter (Signed)
Spoke to patient. Patient reports that she had an episode last thrudasy night where she called EMS. They came and she was in Afib with elevated HR. She other symptoms were severe headache and "felt off." EMTs wanted her to go to the ER but she refused. So they advised her to call her cardiologist which she did yesterday. She did not want to tell her daughter because she knew she would be worried and want her to get checked out.  Sunday, she started having dizziness and nausea. Complains of dropping everything and her depth perception is off as well.  States "something is different." Discussed Dr Darnelle Bos recommendations on going to the ER. She is still hesitant on going.   I let her know her daughter had sent a message to Dr End as well. She said she thought her daughter might contact us as well. I told her again what Dr Darnelle Bos recommendations are and she said she understood and will proceed to the ER this afternoon.

## 2020-07-15 NOTE — ED Provider Notes (Signed)
[facility] Emergency Department Provider Note  ____________________________________________  Time seen: Approximately 3:06 PM  I have reviewed the triage vital signs and the nursing notes.   HISTORY  Chief Complaint Dizziness    HPI Sandra [name] is a 75 y.o. female with a history of anemia, heart failure, paroxysmal atrial fibrillation who comes ED complaining of headache and dizziness for the past 4 days.  Constant, waxing and waning.  She has noticed difficulty with depth perception, like trying to place an object on the table and missing and causing her to fall on the floor.  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  Denies any falls or trauma.  No neck stiffness, no fever.  She is on Eliquis for atrial fibrillation.      Past Medical History:  Diagnosis Date  . (HFpEF) heart failure with preserved ejection fraction ([place])    a. 08/2019 Echo: EF 60-65%, no rwma, mild LVH, Gr2 DD, nl RV fxn. Nl pASP. Mildly dil LA. Mild to mod MR.  [name] Anemia   . Anxiety   . Arthritis   . BRCA gene mutation negative 10/2016   NEGATIVE: [employer]  . Breast cancer ([place]) 10/16/2016   T1c,N0;, GRADE I/III, 1.6cm. ER/PR pos  HER2 not over expressed, Right Upper Outer  . Celiac disease   . Depression   . Dyspnea    WITH EXERTION  . GERD (gastroesophageal reflux disease)    OCC  . Glaucoma    right eye  . Headache    H/O MIGRAINES AS TEENAGER  . Heart murmur    a. 08/2019 Echo: mild to mod MR.  [name] Hypertension   . Hypothyroidism   . Non-obstructive CAD (coronary artery disease)    a. 01/2017 MV: Hypertensive response. No ischemia/infarct. EF >65%; b. 09/2019 Cor CTA: Ca2+ = 9.29 (36th percentile). LAD calcified plaque (0-24%), otw nl. Multipel bilat pulm nodules up to 27m.  .Marland KitchenPAF (paroxysmal atrial fibrillation) ([place]    a. 02/2020 Zio: predominantly RSR, 65 (50-105), rare  PACs/PVCs, 8 beats NSVT, multiple episodes of PAF lasting up to 1hr 234ms. Avg AF rate 130 (93-170). AF burden <1%. Triggered events = RSR, PACs, and PAF; b. CHA2DS2VASc = 6.  . Pneumonia    YEARS AGO  . Pulmonary nodules    a. 09/2019 Cor CTA: incidental finding of multiple bilat pulm nodules; b. 12/2019 High Res CT: stable, scattered solid pulm nodules.  . Raynaud's disease   . RLS (restless legs syndrome)      Patient Active Problem List   Diagnosis Date Noted  . Personal history of colonic polyps   . Chronic diastolic heart failure ([place]/20[place]  . Essential hypertension 05/17/2017  . Glaucoma (increased eye pressure) 02/06/2017  . Invasive ductal carcinoma of breast, female, right ([place]/25/2018  . Prolapse of vaginal vault after hysterectomy 08/24/2016  . Vaginal atrophy 08/24/2016  . Dyspareunia in female 08/24/2016  . Colon polyps 01/06/2015  . BCC (basal cell carcinoma of skin) 01/06/2015  . Hypothyroidism 01/06/2015  . Vitamin D deficiency 01/06/2015  . Hyperlipidemia 01/06/2015  . Depression, major 01/06/2015  . GAD (generalized anxiety disorder) 01/06/2015  . RLS (restless legs syndrome) 01/06/2015  . Cataract 01/06/2015  . Raynaud phenomenon 01/06/2015  . GERD (gastroesophageal reflux disease) 01/06/2015  . Celiac disease 01/06/2015  . OA (osteoarthritis) 01/06/2015  . Osteoporosis 01/06/2015  . Pre-diabetes 01/06/2015  . Avitaminosis D 01/06/2015  .  Raynaud's syndrome without gangrene 01/06/2015  . Age-related osteoporosis without current pathological fracture 01/06/2015  . Anxiety disorder 01/05/2015     Past Surgical History:  Procedure Laterality Date  . ABDOMINAL HYSTERECTOMY    . APPENDECTOMY    . AXILLARY LYMPH NODE BIOPSY Right 03/05/2017   Procedure: AXILLARY LYMPH NODE BIOPSY;  Surgeon: Robert Bellow, MD;  Location: [facility] ORS;  Service: General;  Laterality: Right;  . CATARACT EXTRACTION Bilateral   . CESAREAN SECTION    . COLONOSCOPY  WITH PROPOFOL N/A 05/19/2015   Procedure: COLONOSCOPY WITH PROPOFOL;  Surgeon: Manya Silvas, MD;  Location: [facility] ENDOSCOPY;  Service: Endoscopy;  Laterality: N/A;  . COLONOSCOPY WITH PROPOFOL N/A 08/01/2019   Procedure: COLONOSCOPY WITH PROPOFOL;  Surgeon: Lucilla Lame, MD;  Location: [facility] ENDOSCOPY;  Service: Endoscopy;  Laterality: N/A;  . EYE SURGERY    . HAND SURGERY Left   . MASTECTOMY Right 11/07/2016   RESIDUAL INVASIVE MAMMARY CARCINOMA, SUBAREOLAR ANTERIOR TO PREVIOUS   . SENTINEL NODE BIOPSY Right 11/07/2016   Procedure: SENTINEL NODE BIOPSY;  Surgeon: Robert Bellow, MD;  Location: [facility] ORS;  Service: General;  Laterality: Right;  . SIMPLE MASTECTOMY WITH AXILLARY SENTINEL NODE BIOPSY Right 11/07/2016   6 mm ER/PR 100%; Her 2 neu not overexpressed, T1b, N0.  Surgeon: Robert Bellow, MD;  Location: [facility] ORS;  Service: General;  Laterality: Right;  . TONSILLECTOMY  AGE 19  . UPPER GI ENDOSCOPY  01/12/06   hiatus hernia     Prior to Admission medications   Medication Sig Start Date End Date Taking? Authorizing Provider  ALPRAZolam Duanne Moron) 0.5 MG tablet TAKE 1 TABLET BY MOUTH AT  BEDTIME 07/08/20   Jerrol Banana., MD  apixaban [facility]) 5 MG TABS tablet Take 1 tablet (5 mg) by mouth twice daily 03/17/20   Theora Gianotti, NP  atorvastatin (LIPITOR) 40 MG tablet Take 1 tablet (40 mg total) by mouth daily. 12/30/19 03/29/20  Rise Mu, PA-C  Calcium Carbonate (CALCIUM 600 PO) Take 600 mg by mouth daily at 3 pm.     [provider]  carvedilol (COREG) 6.25 MG tablet Take 1 tablet (6.25 mg total) by mouth 2 (two) times daily with a meal. 07/01/20   Jerrol Banana., MD  cefdinir (OMNICEF) 300 MG capsule Take 1 capsule (300 mg total) by mouth 2 (two) times daily. 06/22/20   Jerrol Banana., MD  Cholecalciferol (VITAMIN D3) 1000 units CAPS Take 1,000 Units by mouth.    [provider]  hydroxypropyl methylcellulose / hypromellose (ISOPTO  TEARS / GONIOVISC) 2.5 % ophthalmic solution Place 1 drop into both eyes 4 (four) times daily as needed for dry eyes (scheduled each morning & then as needed).    [provider]  latanoprost (XALATAN) 0.005 % ophthalmic solution INT 1 GTT IN OD 1 TIME IN THE EVE 02/06/17   [provider]  letrozole (FEMARA) 2.5 MG tablet Take 1 tablet (2.5 mg total) by mouth daily. 09/23/18   Robert Bellow, MD  levothyroxine (SYNTHROID) 100 MCG tablet TAKE 1 TABLET BY MOUTH 30  MINUTES BEFORE BREAKFAST 02/01/20   Jerrol Banana., MD  Multiple Vitamin (MULTIVITAMIN WITH MINERALS) TABS tablet Take 1 tablet by mouth daily at 3 pm.    [provider]  omeprazole (PRILOSEC) 40 MG capsule TAKE 1 CAPSULE BY MOUTH  DAILY 09/17/19   Jerrol Banana., MD  sennosides-docusate sodium (SENOKOT-S) 8.6-50 MG tablet Take  1 tablet by mouth as needed for constipation.    [provider]  sertraline (ZOLOFT) 100 MG tablet TAKE 1 TABLET BY MOUTH  DAILY 03/06/20   Jerrol Banana., MD  Simethicone (GAS-X PO) Take 1 tablet by mouth as needed.    [provider]  timolol (TIMOPTIC) 0.5 % ophthalmic solution Place 1 drop into both eyes every morning. 04/20/20   [provider]     Allergies Patient has no known allergies.   Family History  Problem Relation Age of Onset  . Cancer Mother   . Anemia Mother   . Other Mother        lymphosarcoma  . Alcohol abuse Father   . Prostate cancer Father   . Breast cancer Sister        62; 1/2 sister, shared mother.  . Cancer Sister        bile duct  . Stroke Maternal Grandmother   . Breast cancer Maternal Grandmother 68  . Heart attack Maternal Grandfather   . CAD Paternal Grandfather     Social History Social History   Tobacco Use  . Smoking status: Never Smoker  . Smokeless tobacco: Never Used  Vaping Use  . Vaping Use: Never used  Substance Use Topics  . Alcohol use: Not Currently  . Drug use: No     Review of Systems  Constitutional:   No fever or chills.  ENT:   No sore throat. No rhinorrhea. Cardiovascular:   No chest pain or syncope. Respiratory:   No dyspnea or cough. Gastrointestinal:   Negative for abdominal pain, vomiting and diarrhea.  Musculoskeletal:   Negative for focal pain or swelling All other systems reviewed and are negative except as documented above in ROS and HPI.  ____________________________________________   PHYSICAL EXAM:  VITAL SIGNS: ED Triage Vitals  Enc Vitals Group     BP 07/15/20 1303 (!) 135/59     Pulse Rate 07/15/20 1303 62     Resp 07/15/20 1303 16     Temp 07/15/20 1303 98.6 F (37 C)     Temp Source 07/15/20 1303 Oral     SpO2 07/15/20 1303 100 %     Weight 07/15/20 1304 133 lb (60.3 kg)     Height 07/15/20 1304 '5\' 3"'  (1.6 m)     Head Circumference --      Peak Flow --      Pain Score 07/15/20 1304 0     Pain Loc --      Pain Edu? --      Excl. in [place]? --     Vital signs reviewed, nursing assessments reviewed.   Constitutional:   Alert and oriented. Non-toxic appearance. Eyes:   Conjunctivae are normal. EOMI. PERRL. ENT      Head:   Normocephalic and atraumatic.      Nose:   Wearing a mask.      Mouth/Throat:   Wearing a mask.      Neck:   No meningismus. Full ROM. Hematological/Lymphatic/Immunilogical:   No cervical lymphadenopathy. Cardiovascular:   RRR. Symmetric bilateral radial and DP pulses.  No murmurs. Cap refill less than 2 seconds. Respiratory:   Normal respiratory effort without tachypnea/retractions. Breath sounds are clear and equal bilaterally. No wheezes/rales/rhonchi. Gastrointestinal:   Soft and nontender. Non distended. There is no CVA tenderness.  No rebound, rigidity, or guarding.  Musculoskeletal:   Normal range of motion in all extremities. No joint effusions.  No lower extremity tenderness.  No edema. Neurologic:   Normal speech and language.  Cranial nerves III through XII intact Motor grossly  intact. Unsteady gait Skin:    Skin is warm, dry and intact. No rash noted.  No petechiae, purpura, or bullae.  ____________________________________________    LABS (pertinent positives/negatives) (all labs ordered are listed, but only abnormal results are displayed) Labs Reviewed  BASIC METABOLIC PANEL - Abnormal; Notable for the following components:      Result Value   Glucose, Bld 119 (*)    All other components within normal limits  CBC - Abnormal; Notable for the following components:   Hemoglobin 11.4 (*)    HCT 35.6 (*)    All other components within normal limits  URINALYSIS, COMPLETE (UACMP) WITH MICROSCOPIC  VITAMIN B12   ____________________________________________   EKG  Interpreted by me Sinus bradycardia rate of 59, normal axis and intervals.  Normal QRS ST segments and T waves.  No ischemic changes.  ____________________________________________    RADIOLOGY  No results found.  ____________________________________________   PROCEDURES Procedures  ____________________________________________  DIFFERENTIAL DIAGNOSIS   Ischemic stroke, intracranial hemorrhage, electrolyte abnormality, dehydration  CLINICAL IMPRESSION / ASSESSMENT AND PLAN / ED COURSE  Medications ordered in the ED: Medications - No data to display  Pertinent labs & imaging results that were available during my care of the patient were reviewed by me and considered in my medical decision making (see chart for details).  Sandra [name] was evaluated in Emergency Department on 07/15/2020 for the symptoms described in the history of present illness. She was evaluated in the context of the global COVID-19 pandemic, which necessitated consideration that the patient might be at risk for infection with the SARS-CoV-2 virus that causes COVID-19. Institutional protocols and algorithms that pertain to the evaluation of patients at risk for COVID-19 are in a state of rapid change based on  information released by regulatory bodies including the CDC and federal and state organizations. These policies and algorithms were followed during the patient's care in the ED.   Patient presents with multiple neurologic symptoms including alteration of depth perception/proprioception, alteration of balance, alteration of language processing.  Concern for stroke given her age and history of atrial fibrillation despite Eliquis use.  Will obtain CT scan of the head.  Last known normal was 4 days ago, not a candidate for endovascular therapy or TPA.  Anticipate hospitalization after initial ED work-up for further evaluation.      ____________________________________________   FINAL CLINICAL IMPRESSION(S) / ED DIAGNOSES    Final diagnoses:  Aphasia  Loss of balance  Atrial fibrillation, unspecified type [facility])     ED Discharge Orders    None      Portions of this note were generated with dragon dictation software. Dictation errors may occur despite best attempts at proofreading.   Carrie Mew, MD 07/15/20 [phone]

## 2020-07-15 NOTE — ED Notes (Signed)
Patient reports she is taking her night time medications at this time. Patient continues to wait for transfer to [facility]. Daughter at bedside aware of plan of care. Patient provided with comfort items, including a warm blanket and cup for hearing aids. Patient suction cannister emptied as well. Patient denies concerns, questions, or requests at this time. Lights dimmed for patient comfort.

## 2020-07-15 NOTE — Telephone Encounter (Signed)
Patient returning call and confirmed visit tomorrow.

## 2020-07-15 NOTE — Telephone Encounter (Signed)
Daughter calling back to let us know her mother (patient) is going to be admitted. She was very thankful for all our help today.  I will cancel tomorrow's appointment with Dr End.

## 2020-07-15 NOTE — ED Notes (Signed)
Spoke with Kimberly at [facility], pt still awaiting a bed at [facility] 

## 2020-07-15 NOTE — ED Triage Notes (Signed)
Pt c/o having dizziness for the past 4 days and having issues with her depth perception, "like I will go to sit something down and drop it, or go to reach for something and it is not even there"

## 2020-07-15 NOTE — ED Notes (Signed)
Patient is resting comfortably. 

## 2020-07-15 NOTE — ED Notes (Signed)
Per Ruby @ [facility] still no bed available for patient

## 2020-07-15 NOTE — ED Notes (Signed)
Patient ok to eat per Dr. Cheri Fowler.

## 2020-07-15 NOTE — ED Triage Notes (Signed)
Pt having dizziness since Sunday with a slight headache- pt has a hx of afib- pt states she has also been dropping items and that her depth perception is bad

## 2020-07-15 NOTE — Telephone Encounter (Signed)
Attempted to call patient, no answer and no VM set up.   Called daughter, ok per DPR.  We discussed patient's symptoms as listed in the MyChart message and Dr Darnelle Bos recommendations.  She understands where Dr End is coming from but says her mother is very difficult and not agreeable to going to the ER. States a week ago on Thursday night is when patient called EMS.  They wanted her to go but she refused.  Patient did not even tell her daughter about this until the past day or so.  Advised that Dr End recommends ER for further evaluation. We discussed me calling the patient to let her know what Dr End recommends and see if she will be willing to go to ER at that point. Daughter is planning to go to her house this afternoon and patient is not aware of this at this time.  Daughter also asked if Dr End would order carotid doppler at this time prior to appointment.  Advised that I still feel like Dr End will recommend ER as plan of care.  We left it that I would call patient and speak with her and keep daughter updated.

## 2020-07-15 NOTE — Telephone Encounter (Signed)
Called daughter back and let her know that patient is agreeable to go to the ER. Daughter was appreciative.  Called ER and gave update to Mickel Baas the nurse with patient. Advised her to call patient's daughter for any questions patient may not be able to answer.

## 2020-07-16 ENCOUNTER — Inpatient Hospital Stay: Payer: Medicare Other

## 2020-07-16 ENCOUNTER — Ambulatory Visit: Payer: Medicare Other | Admitting: Nurse Practitioner

## 2020-07-16 ENCOUNTER — Emergency Department: Payer: Medicare Other

## 2020-07-16 ENCOUNTER — Inpatient Hospital Stay
Admit: 2020-07-16 | Discharge: 2020-07-16 | Disposition: A | Discharge status: Home / Self Care | Payer: Medicare Other | Attending: Neurology | Admitting: Neurology

## 2020-07-16 DIAGNOSIS — H409 Unspecified glaucoma: Secondary | ICD-10-CM | POA: Diagnosis present

## 2020-07-16 DIAGNOSIS — F329 Major depressive disorder, single episode, unspecified: Secondary | ICD-10-CM | POA: Diagnosis present

## 2020-07-16 DIAGNOSIS — Z7901 Long term (current) use of anticoagulants: Secondary | ICD-10-CM

## 2020-07-16 DIAGNOSIS — I110 Hypertensive heart disease with heart failure: Secondary | ICD-10-CM | POA: Diagnosis present

## 2020-07-16 DIAGNOSIS — K219 Gastro-esophageal reflux disease without esophagitis: Secondary | ICD-10-CM | POA: Diagnosis present

## 2020-07-16 DIAGNOSIS — I616 Nontraumatic intracerebral hemorrhage, multiple localized: Secondary | ICD-10-CM | POA: Diagnosis not present

## 2020-07-16 DIAGNOSIS — I618 Other nontraumatic intracerebral hemorrhage: Secondary | ICD-10-CM | POA: Diagnosis not present

## 2020-07-16 DIAGNOSIS — I614 Nontraumatic intracerebral hemorrhage in cerebellum: Secondary | ICD-10-CM | POA: Diagnosis present

## 2020-07-16 DIAGNOSIS — I619 Nontraumatic intracerebral hemorrhage, unspecified: Secondary | ICD-10-CM

## 2020-07-16 DIAGNOSIS — I2510 Atherosclerotic heart disease of native coronary artery without angina pectoris: Secondary | ICD-10-CM | POA: Diagnosis present

## 2020-07-16 DIAGNOSIS — Z823 Family history of stroke: Secondary | ICD-10-CM | POA: Diagnosis not present

## 2020-07-16 DIAGNOSIS — I480 Paroxysmal atrial fibrillation: Secondary | ICD-10-CM | POA: Diagnosis present

## 2020-07-16 DIAGNOSIS — I5032 Chronic diastolic (congestive) heart failure: Secondary | ICD-10-CM | POA: Diagnosis not present

## 2020-07-16 DIAGNOSIS — R7303 Prediabetes: Secondary | ICD-10-CM | POA: Diagnosis present

## 2020-07-16 DIAGNOSIS — G2581 Restless legs syndrome: Secondary | ICD-10-CM | POA: Diagnosis present

## 2020-07-16 DIAGNOSIS — D649 Anemia, unspecified: Secondary | ICD-10-CM | POA: Diagnosis present

## 2020-07-16 DIAGNOSIS — R2689 Other abnormalities of gait and mobility: Secondary | ICD-10-CM

## 2020-07-16 DIAGNOSIS — I6359 Cerebral infarction due to unspecified occlusion or stenosis of other cerebral artery: Secondary | ICD-10-CM | POA: Diagnosis not present

## 2020-07-16 DIAGNOSIS — Z79811 Long term (current) use of aromatase inhibitors: Secondary | ICD-10-CM | POA: Diagnosis not present

## 2020-07-16 DIAGNOSIS — Z79899 Other long term (current) drug therapy: Secondary | ICD-10-CM | POA: Diagnosis not present

## 2020-07-16 DIAGNOSIS — Z20822 Contact with and (suspected) exposure to covid-19: Secondary | ICD-10-CM | POA: Diagnosis present

## 2020-07-16 DIAGNOSIS — R4701 Aphasia: Secondary | ICD-10-CM | POA: Diagnosis present

## 2020-07-16 DIAGNOSIS — K900 Celiac disease: Secondary | ICD-10-CM | POA: Diagnosis present

## 2020-07-16 DIAGNOSIS — E039 Hypothyroidism, unspecified: Secondary | ICD-10-CM | POA: Diagnosis present

## 2020-07-16 DIAGNOSIS — R519 Headache, unspecified: Secondary | ICD-10-CM | POA: Diagnosis not present

## 2020-07-16 DIAGNOSIS — G936 Cerebral edema: Secondary | ICD-10-CM | POA: Diagnosis not present

## 2020-07-16 DIAGNOSIS — Z9011 Acquired absence of right breast and nipple: Secondary | ICD-10-CM | POA: Diagnosis not present

## 2020-07-16 DIAGNOSIS — Z79890 Hormone replacement therapy (postmenopausal): Secondary | ICD-10-CM | POA: Diagnosis not present

## 2020-07-16 DIAGNOSIS — I6502 Occlusion and stenosis of left vertebral artery: Secondary | ICD-10-CM | POA: Diagnosis not present

## 2020-07-16 DIAGNOSIS — Z8249 Family history of ischemic heart disease and other diseases of the circulatory system: Secondary | ICD-10-CM | POA: Diagnosis not present

## 2020-07-16 DIAGNOSIS — I10 Essential (primary) hypertension: Secondary | ICD-10-CM | POA: Diagnosis not present

## 2020-07-16 DIAGNOSIS — Z853 Personal history of malignant neoplasm of breast: Secondary | ICD-10-CM | POA: Diagnosis not present

## 2020-07-16 DIAGNOSIS — F419 Anxiety disorder, unspecified: Secondary | ICD-10-CM | POA: Diagnosis present

## 2020-07-16 DIAGNOSIS — I4891 Unspecified atrial fibrillation: Secondary | ICD-10-CM | POA: Diagnosis not present

## 2020-07-16 LAB — CBC
HCT: 34 % — ABNORMAL LOW (ref 36.0–46.0)
Hemoglobin: 11 g/dL — ABNORMAL LOW (ref 12.0–15.0)
MCH: 28.3 pg (ref 26.0–34.0)
MCHC: 32.4 g/dL (ref 30.0–36.0)
MCV: 87.4 fL (ref 80.0–100.0)
Platelets: 198 10*3/uL (ref 150–400)
RBC: 3.89 MIL/uL (ref 3.87–5.11)
RDW: 13.5 % (ref 11.5–15.5)
WBC: 5.5 10*3/uL (ref 4.0–10.5)
nRBC: 0 % (ref 0.0–0.2)

## 2020-07-16 LAB — LIPID PANEL
Cholesterol: 143 mg/dL (ref 0–200)
HDL: 49 mg/dL (ref >40)
LDL Cholesterol: 75 mg/dL (ref 0–99)
Total CHOL/HDL Ratio: 2.9 RATIO
Triglycerides: 95 mg/dL (ref <150)
VLDL: 19 mg/dL (ref 0–40)

## 2020-07-16 LAB — PROTIME-INR
INR: 1.1 (ref 0.8–1.2)
Prothrombin Time: 13.7 seconds (ref 11.4–15.2)

## 2020-07-16 LAB — HEMOGLOBIN A1C
Hgb A1c MFr Bld: 6.2 % — ABNORMAL HIGH (ref 4.8–5.6)
Mean Plasma Glucose: 131.24 mg/dL

## 2020-07-16 LAB — FOLATE: Folate: 32 ng/mL (ref >5.9)

## 2020-07-16 LAB — APTT: aPTT: 31 seconds (ref 24–36)

## 2020-07-16 MED ORDER — HYPROMELLOSE (GONIOSCOPIC) 2.5 % OP SOLN: 1.0000 [drp] | Freq: Four times a day (QID) | OPHTHALMIC | Status: DC | PRN

## 2020-07-16 MED ORDER — ACETAMINOPHEN 160 MG/5ML PO SOLN
650.0000 mg | ORAL | Status: DC | PRN
  Filled 2020-07-16: qty 20.3

## 2020-07-16 MED ORDER — TIMOLOL MALEATE 0.5 % OP SOLN
1.0000 [drp] | Freq: Every morning | OPHTHALMIC | Status: DC
  Administered 2020-07-17: 1 [drp] via OPHTHALMIC
  Filled 2020-07-16: qty 5

## 2020-07-16 MED ORDER — ADULT MULTIVITAMIN W/MINERALS CH
1.0000 | ORAL_TABLET | Freq: Every day | ORAL | Status: DC
  Administered 2020-07-17: 1 via ORAL
  Filled 2020-07-16: qty 1

## 2020-07-16 MED ORDER — NICARDIPINE HCL IN NACL 20-0.86 MG/200ML-% IV SOLN
0.0000 mg/h | INTRAVENOUS | Status: DC
  Administered 2020-07-16 (×2): 7.5 mg/h via INTRAVENOUS
  Filled 2020-07-16 (×4): qty 200

## 2020-07-16 MED ORDER — ACETAMINOPHEN 650 MG RE SUPP: 650.0000 mg | RECTAL | Status: DC | PRN

## 2020-07-16 MED ORDER — GADOBUTROL 1 MMOL/ML IV SOLN
6.0000 mL | Freq: Once | INTRAVENOUS | Status: AC | PRN
  Administered 2020-07-16: 6 mL via INTRAVENOUS

## 2020-07-16 MED ORDER — SERTRALINE HCL 50 MG PO TABS
100.0000 mg | ORAL_TABLET | Freq: Every day | ORAL | Status: DC
  Administered 2020-07-16 – 2020-07-18 (×3): 100 mg via ORAL
  Filled 2020-07-16 (×3): qty 2

## 2020-07-16 MED ORDER — LATANOPROST 0.005 % OP SOLN
1.0000 [drp] | Freq: Every day | OPHTHALMIC | Status: DC
  Administered 2020-07-16 – 2020-07-17 (×2): 1 [drp] via OPHTHALMIC
  Filled 2020-07-16: qty 2.5

## 2020-07-16 MED ORDER — SENNOSIDES-DOCUSATE SODIUM 8.6-50 MG PO TABS
1.0000 | ORAL_TABLET | Freq: Two times a day (BID) | ORAL | Status: DC
  Administered 2020-07-16 – 2020-07-18 (×4): 1 via ORAL
  Filled 2020-07-16 (×4): qty 1

## 2020-07-16 MED ORDER — STROKE: EARLY STAGES OF RECOVERY BOOK: Freq: Once | Status: DC

## 2020-07-16 MED ORDER — LEVOTHYROXINE SODIUM 50 MCG PO TABS
100.0000 ug | ORAL_TABLET | Freq: Every day | ORAL | Status: DC
  Administered 2020-07-17 – 2020-07-18 (×2): 100 ug via ORAL
  Filled 2020-07-16 (×2): qty 2

## 2020-07-16 MED ORDER — LABETALOL HCL 5 MG/ML IV SOLN
10.0000 mg | INTRAVENOUS | Status: DC | PRN
Start: 2020-07-16 — End: 2020-07-17
  Administered 2020-07-16 – 2020-07-17 (×2): 10 mg via INTRAVENOUS
  Filled 2020-07-16 (×2): qty 4

## 2020-07-16 MED ORDER — SENNA-DOCUSATE SODIUM 8.6-50 MG PO TABS: 1.0000 | ORAL_TABLET | ORAL | Status: DC | PRN

## 2020-07-16 MED ORDER — ATORVASTATIN CALCIUM 20 MG PO TABS
40.0000 mg | ORAL_TABLET | Freq: Every day | ORAL | Status: DC
  Administered 2020-07-16 – 2020-07-18 (×3): 40 mg via ORAL
  Filled 2020-07-16 (×3): qty 2

## 2020-07-16 MED ORDER — ACETAMINOPHEN 500 MG PO TABS: 500.0000 mg | ORAL_TABLET | Freq: Four times a day (QID) | ORAL | Status: DC | PRN

## 2020-07-16 MED ORDER — POLYVINYL ALCOHOL 1.4 % OP SOLN
1.0000 [drp] | Freq: Four times a day (QID) | OPHTHALMIC | Status: DC | PRN
  Filled 2020-07-16: qty 15

## 2020-07-16 MED ORDER — BIOTENE DRY MOUTH MT LIQD: 15.0000 mL | OROMUCOSAL | Status: DC | PRN

## 2020-07-16 MED ORDER — PANTOPRAZOLE SODIUM 40 MG PO TBEC
40.0000 mg | DELAYED_RELEASE_TABLET | Freq: Every day | ORAL | Status: DC
  Administered 2020-07-16 – 2020-07-18 (×3): 40 mg via ORAL
  Filled 2020-07-16 (×3): qty 1

## 2020-07-16 MED ORDER — LETROZOLE 2.5 MG PO TABS
2.5000 mg | ORAL_TABLET | Freq: Every day | ORAL | Status: DC
  Administered 2020-07-16 – 2020-07-18 (×3): 2.5 mg via ORAL
  Filled 2020-07-16 (×3): qty 1

## 2020-07-16 MED ORDER — ALPRAZOLAM 0.5 MG PO TABS
0.5000 mg | ORAL_TABLET | Freq: Every day | ORAL | Status: DC
  Administered 2020-07-16 – 2020-07-17 (×2): 0.5 mg via ORAL
  Filled 2020-07-16 (×2): qty 1

## 2020-07-16 MED ORDER — CARVEDILOL 3.125 MG PO TABS
6.2500 mg | ORAL_TABLET | Freq: Two times a day (BID) | ORAL | Status: DC
  Administered 2020-07-16 – 2020-07-18 (×4): 6.25 mg via ORAL
  Filled 2020-07-16 (×2): qty 1
  Filled 2020-07-16 (×2): qty 2

## 2020-07-16 MED ORDER — ACETAMINOPHEN 325 MG PO TABS: 650.0000 mg | ORAL_TABLET | ORAL | Status: DC | PRN

## 2020-07-16 MED ORDER — CALCIUM CARBONATE-VITAMIN D 500-200 MG-UNIT PO TABS
2.0000 | ORAL_TABLET | Freq: Every day | ORAL | Status: DC
  Administered 2020-07-16 – 2020-07-17 (×2): 2 via ORAL
  Filled 2020-07-16 (×3): qty 2

## 2020-07-16 NOTE — Progress Notes (Signed)
Repeat CT brain obtained and reviewed. No change in size of hemorrhage. I do not believe the patient needs to transfer to [facility] for Nsx eval at this time. Please call with questions or concerns.

## 2020-07-16 NOTE — H&P (Signed)
History and Physical    Sandra [name] GTX:[id number] DOB: [birth year] DOA: 07/15/2020  PCP: Jerrol Banana., MD   Patient coming from: Home  I have personally briefly reviewed patient's old medical records in [place]  Chief Complaint: Dizziness x 4 days  HPI: Sandra [name] is a 75 y.o. female with medical history significant for paroxysmal atrial fibrillation on anticoagulation therapy, history of breast cancer status post mastectomy, history of heart failure with preserved ejection fraction, depression, hypertension and hypothyroidism who presents to the ED for evaluation of a headache and dizziness that she has had for about 4 days.  Patient states that she developed dizziness about 4 days prior to presenting to the ER, it is been constant and associated with nausea but no vomiting.  She also noticed difficulty with depth perception and has had issues with trying to place an object on the table or her missing a spot to sit down resulting in a fall.  She has been so dizzy and has had to hold onto surfaces while walking or hold onto her husband to prevent her from having a fall.  Family notes that she has had word finding difficulty as well as some slurred speech.  She has no weakness involving any of her extremities, no blurred vision or difficulty swallowing.  She complains of constipation but this is chronic. About 5 days prior to her presentation she also developed palpitations and called EMS and was noted to be in atrial fibrillation with a rapid ventricular rate.  Patient declined transport to the ER and opted to follow-up with her cardiologist as an outpatient. She denies any trauma, no fever, no chills, no neck stiffness, no lightheadedness, no chest pain, no diaphoresis, no palpitations, no vomiting, no abdominal pain, no diarrhea, no dysuria, no frequency, no hematuria, no cough or shortness of breath. Labs show sodium 139, potassium 3.5, chloride 103, bicarb 27, glucose  118, BUN 16, creatinine 0.68, calcium 8.9, white count 5.5, hemoglobin 11, hematocrit 34, MCV 87, RDW 13.5, platelet count 198, PT 13.7, INR 1.1 Respiratory viral panel is negative 02/17 - CT scan of the head without contrast shows focal area of hemorrhage with adjacent edema in the left cerebellar dentate nucleus. Suspect acute hemorrhagic infarct in this area. Elsewhere there is mild atrophy with patchy periventricular small vessel disease. There are foci of arterial vascular calcification. There is mucosal thickening in several ethmoid air cells. Repeat CT scan of the head  02/18 - CT scan of the head done without contrast shows 1 cm hemorrhage with mild adjacent edema in the deep left cerebellum. Often, hemorrhage in this location is hypertensive, but no excessive small-vessel ischemic change for age. No cortical infarct, hydrocephalus, or collection. Twelve-lead EKG reviewed by me shows sinus bradycardia, ST and T wave changes involving the inferior lateral wall concerning for ischemia.   ED Course: Patient is a 75 year old Caucasian female who presented to the ER for evaluation of a 4-day history of dizziness and a headache.  CT scan of the head without contrast showed a focal area of hemorrhage with adjacent edema in the left cerebellar dentate nucleus.  Patient is on Eliquis for paroxysmal A. fib and received a dose of Kcentra in the emergency room.  She was also started on a Cardene drip for blood pressure control.  Recommendation was initially made for transfer to [facility] for neurosurgery evaluation.  Repeat CT scan of the brain without contrast was done per neurosurgery recommendation and  without any change in the size of the bleed and no surgical intervention planned, medical management was recommended and patient will be admitted to stepdown for further evaluation and treatment.    Review of Systems: As per HPI otherwise all other systems reviewed and negative.    Past  Medical History:  Diagnosis Date  . (HFpEF) heart failure with preserved ejection fraction ([place])    a. 08/2019 Echo: EF 60-65%, no rwma, mild LVH, Gr2 DD, nl RV fxn. Nl pASP. Mildly dil LA. Mild to mod MR.  [name] Anemia   . Anxiety   . Arthritis   . BRCA gene mutation negative 10/2016   NEGATIVE: [employer]  . Breast cancer ([place]) 10/16/2016   T1c,N0;, GRADE I/III, 1.6cm. ER/PR pos  HER2 not over expressed, Right Upper Outer  . Celiac disease   . Depression   . Dyspnea    WITH EXERTION  . GERD (gastroesophageal reflux disease)    OCC  . Glaucoma    right eye  . Headache    H/O MIGRAINES AS TEENAGER  . Heart murmur    a. 08/2019 Echo: mild to mod MR.  [name] Hypertension   . Hypothyroidism   . Non-obstructive CAD (coronary artery disease)    a. 01/2017 MV: Hypertensive response. No ischemia/infarct. EF >65%; b. 09/2019 Cor CTA: Ca2+ = 9.29 (36th percentile). LAD calcified plaque (0-24%), otw nl. Multipel bilat pulm nodules up to 54m.  .Mar[place] KitchenPAF ([place]ox[place]    a. 02/2020 Zio: predominantly RSR, 65 (50-105), rare PACs/PVCs, 8 beats NSVT, multiple episodes of PAF lasting up to 1hr 268ms. Avg AF rate 130 (93-170). AF burden <1%. Triggered events = RSR, PACs, and PAF; b. CHA2DS2VASc = 6.  . Pneumonia    YEARS AGO  . Pulmonary nodules    a. 09/2019 Cor CTA: incidental finding of multiple bilat pulm nodules; b. 12/2019 High Res CT: stable, scattered solid pulm nodules.  . Raynaud's disease   . RLS (restless legs syndrome)     Past Surgical History:  Procedure Laterality Date  . ABDOMINAL HYSTERECTOMY    . APPENDECTOMY    . AXILLARY LYMPH NODE BIOPSY Right 03/05/2017   Procedure: AXILLARY LYMPH NODE BIOPSY;  Surgeon: ByRobert BellowMD;  Location: [facility] ORS;  Service: General;  Laterality: Right;  . CATARACT EXTRACTION Bilateral   . CESAREAN SECTION    . COLONOSCOPY WITH PROPOFOL N/A 05/19/2015   Procedure: COLONOSCOPY WITH PROPOFOL;  Surgeon: RoManya SilvasMD;   [facility];  Service: Endoscopy;  Laterality: N/A;  . COLONOSCOPY WITH PROPOFOL N/A 08/01/2019   Procedure: COLONOSCOPY WITH PROPOFOL;  Surgeon: WoLucilla LameMD;  [facility];  Service: Endoscopy;  Laterality: N/A;  . EYE SURGERY    . HAND SURGERY Left   . MASTECTOMY Right 11/07/2016   RESIDUAL INVASIVE MAMMARY CARCINOMA, SUBAREOLAR ANTERIOR TO PREVIOUS   . SENTINEL NODE BIOPSY Right 11/07/2016   Procedure: SENTINEL NODE BIOPSY;  Surgeon: ByRobert BellowMD;  Location: [facility] ORS;  Service: General;  Laterality: Right;  . SIMPLE MASTECTOMY WITH AXILLARY SENTINEL NODE BIOPSY Right 11/07/2016   6 mm ER/PR 100%; Her 2 neu not overexpressed, T1b, N0.  Surgeon: ByRobert BellowMD;  Location: [facility] ORS;  Service: General;  Laterality: Right;  . TONSILLECTOMY  AGE 24  . UPPER GI ENDOSCOPY  01/12/06   hiatus hernia     reports that she has never smoked. She has never used smokeless tobacco. She reports previous alcohol use.  She reports that she does not use drugs.  No Known Allergies  Family History  Problem Relation Age of Onset  . Cancer Mother   . Anemia Mother   . Other Mother        lymphosarcoma  . Alcohol abuse Father   . Prostate cancer Father   . Breast cancer Sister        20; 1/2 sister, shared mother.  . Cancer Sister        bile duct  . Stroke Maternal Grandmother   . Breast cancer Maternal Grandmother 68  . Heart attack Maternal Grandfather   . CAD Paternal Grandfather       Prior to Admission medications   Medication Sig Start Date End Date Taking? Authorizing Provider  acetaminophen (TYLENOL) 500 MG tablet Take 500-1,000 mg by mouth every 6 (six) hours as needed for mild pain or moderate pain.   Yes [provider]  ALPRAZolam (XANAX) 0.5 MG tablet TAKE 1 TABLET BY MOUTH AT  BEDTIME Patient taking differently: Take 0.5 mg by mouth at bedtime. 07/08/20  Yes Jerrol Banana., MD  apixaban [facility]) 5 MG TABS tablet Take 1  tablet (5 mg) by mouth twice daily 03/17/20  Yes Theora Gianotti, NP  atorvastatin (LIPITOR) 40 MG tablet Take 40 mg by mouth daily.   Yes [provider]  Calcium Carb-Cholecalciferol 500-400 MG-UNIT TABS Take 1-2 tablets by mouth daily.   Yes [provider]  carvedilol (COREG) 6.25 MG tablet Take 1 tablet (6.25 mg total) by mouth 2 (two) times daily with a meal. 07/01/20  Yes Jerrol Banana., MD  Cholecalciferol (VITAMIN D3) 125 MCG (5000 UT) TABS Take 125 Units by mouth daily.   Yes [provider]  hydroxypropyl methylcellulose / hypromellose (ISOPTO TEARS / GONIOVISC) 2.5 % ophthalmic solution Place 1 drop into both eyes 4 (four) times daily as needed for dry eyes.   Yes [provider]  latanoprost (XALATAN) 0.005 % ophthalmic solution Place 1 drop into both eyes at bedtime.   Yes [provider]  letrozole (FEMARA) 2.5 MG tablet Take 1 tablet (2.5 mg total) by mouth daily. 09/23/18  Yes Byrnett, Forest Gleason, MD  levothyroxine (SYNTHROID) 100 MCG tablet TAKE 1 TABLET BY MOUTH 30  MINUTES BEFORE BREAKFAST Patient taking differently: Take 100 mcg by mouth daily before breakfast. 02/01/20  Yes Jerrol Banana., MD  Multiple Vitamin (MULTIVITAMIN WITH MINERALS) TABS tablet Take 1 tablet by mouth daily at 3 pm.   Yes [provider]  omeprazole (PRILOSEC) 40 MG capsule TAKE 1 CAPSULE BY MOUTH  DAILY Patient taking differently: Take 40 mg by mouth daily. 09/17/19  Yes Jerrol Banana., MD  sennosides-docusate sodium (SENOKOT-S) 8.6-50 MG tablet Take 1 tablet by mouth as needed for constipation.   Yes [provider]  sertraline (ZOLOFT) 100 MG tablet TAKE 1 TABLET BY MOUTH  DAILY Patient taking differently: Take 100 mg by mouth daily. 03/06/20  Yes Jerrol Banana., MD  timolol (TIMOPTIC) 0.5 % ophthalmic solution Place 1 drop into both eyes every morning. 04/20/20  Yes [provider]    Physical  Exam: Vitals:   07/16/20 0845 07/16/20 0900 07/16/20 1000 07/16/20 1100  BP: 128/65 125/61 123/61 121/60  Pulse: 64 62 66 66  Resp: 20 14 15 16   Temp:      TempSrc:      SpO2: 98% 99% 97% 95%  Weight:  Height:         Vitals:   07/16/20 0845 07/16/20 0900 07/16/20 1000 07/16/20 1100  BP: 128/65 125/61 123/61 121/60  Pulse: 64 62 66 66  Resp: 20 14 15 16   Temp:      TempSrc:      SpO2: 98% 99% 97% 95%  Weight:      Height:          Constitutional: Alert and oriented x 3 . Not in any apparent distress HEENT:      Head: Normocephalic and atraumatic.         Eyes: PERLA, EOMI, Conjunctivae are normal. Sclera is non-icteric.       Mouth/Throat: Mucous membranes are moist.       Neck: Supple with no signs of meningismus. Cardiovascular: Regular rate and rhythm. No murmurs, gallops, or rubs. 2+ symmetrical distal pulses are present . No JVD. No LE edema Respiratory: Respiratory effort normal.Lungs sounds clear bilaterally. No wheezes, crackles, or rhonchi.  Gastrointestinal: Soft, non tender, and non distended with positive bowel sounds.  Genitourinary: No CVA tenderness. Musculoskeletal: Nontender with normal range of motion in all extremities. No cyanosis, or erythema of extremities. Neurologic:  Face is symmetric. Moving all extremities. No gross focal neurologic deficits  Skin: Skin is warm, dry.  No rash or ulcers Psychiatric: Mood and affect are normal   Labs on Admission: I have personally reviewed following labs and imaging studies  CBC: Recent Labs  Lab 07/15/20 1307 07/16/20 0503  WBC 5.7 5.5  HGB 11.4* 11.0*  HCT 35.6* 34.0*  MCV 89.0 87.4  PLT 210 416   Basic Metabolic Panel: Recent Labs  Lab 07/15/20 1307  NA 139  K 3.5  CL 103  CO2 27  GLUCOSE 119*  BUN 16  CREATININE 0.68  CALCIUM 8.9   GFR: Estimated Creatinine Clearance: 50.3 mL/min (by C-G formula based on SCr of 0.68 mg/dL). Liver Function Tests: No results for input(s): AST,  ALT, ALKPHOS, BILITOT, PROT, ALBUMIN in the last 168 hours. No results for input(s): LIPASE, AMYLASE in the last 168 hours. No results for input(s): AMMONIA in the last 168 hours. Coagulation Profile: Recent Labs  Lab 07/15/20 1549 07/16/20 1252  INR 1.4* 1.1   Cardiac Enzymes: No results for input(s): CKTOTAL, CKMB, CKMBINDEX, TROPONINI in the last 168 hours. BNP (last 3 results) No results for input(s): PROBNP in the last 8760 hours. HbA1C: No results for input(s): HGBA1C in the last 72 hours. CBG: No results for input(s): GLUCAP in the last 168 hours. Lipid Profile: No results for input(s): CHOL, HDL, LDLCALC, TRIG, CHOLHDL, LDLDIRECT in the last 72 hours. Thyroid Function Tests: No results for input(s): TSH, T4TOTAL, FREET4, T3FREE, THYROIDAB in the last 72 hours. Anemia Panel: No results for input(s): VITAMINB12, FOLATE, FERRITIN, TIBC, IRON, RETICCTPCT in the last 72 hours. Urine analysis:    Component Value Date/Time   COLORURINE STRAW (A) 07/15/2020 1549   APPEARANCEUR CLEAR (A) 07/15/2020 1549   LABSPEC 1.003 (L) 07/15/2020 1549   PHURINE 7.0 07/15/2020 1549   GLUCOSEU NEGATIVE 07/15/2020 1549   HGBUR NEGATIVE 07/15/2020 1549   BILIRUBINUR NEGATIVE 07/15/2020 1549   BILIRUBINUR Negative 11/24/2019 1352   KETONESUR NEGATIVE 07/15/2020 1549   PROTEINUR NEGATIVE 07/15/2020 1549   UROBILINOGEN 0.2 11/24/2019 1352   NITRITE NEGATIVE 07/15/2020 1549   LEUKOCYTESUR NEGATIVE 07/15/2020 1549    Radiological Exams on Admission: CT HEAD WO CONTRAST  Result Date: 07/16/2020 CLINICAL DATA:  Follow-up CT EXAM: CT HEAD WITHOUT  CONTRAST TECHNIQUE: Contiguous axial images were obtained from the base of the skull through the vertex without intravenous contrast. COMPARISON:  Yesterday FINDINGS: Brain: 1 cm hemorrhage with mild adjacent edema in the deep left cerebellum. Often, hemorrhage in this location is hypertensive, but no excessive small-vessel ischemic change for age. No  cortical infarct, hydrocephalus, or collection. Vascular: No hyperdense vessel or unexpected calcification. Skull: Normal. Negative for fracture or focal lesion. Sinuses/Orbits: No acute finding. IMPRESSION: Unchanged 1 cm hemorrhage in the deep left cerebellum. Electronically Signed   By: Monte Fantasia M.D.   On: 07/16/2020 07:58   CT Head Wo Contrast  Result Date: 07/15/2020 CLINICAL DATA:  Dizziness EXAM: CT HEAD WITHOUT CONTRAST TECHNIQUE: Contiguous axial images were obtained from the base of the skull through the vertex without intravenous contrast. COMPARISON:  None. FINDINGS: Brain: There is mild diffuse atrophy. There is a focal area of hemorrhage arising in the left cerebellar dentate nucleus measuring 0.9 x 0.9 cm. There is mild surrounding edema in this area. There is no other appreciable hemorrhage. There is no associated mass. There is no extra-axial fluid collection or midline shift. There is patchy small vessel disease in the centra semiovale bilaterally. Vascular: No hyperdense vessel. There is calcification in each carotid siphon region. Skull: The bony calvarium appears intact. Sinuses/Orbits: There is mucosal thickening in several ethmoid air cells. Other visualized paranasal sinuses are clear. Visualized orbits appear symmetric bilaterally. Other: Mastoid air cells are clear. IMPRESSION: Focal area of hemorrhage with adjacent edema in the left cerebellar dentate nucleus. Suspect acute hemorrhagic infarct in this area. Elsewhere there is mild atrophy with patchy periventricular small vessel disease. There are foci of arterial vascular calcification. There is mucosal thickening in several ethmoid air cells. Critical Value/emergent results were called by telephone at the time of interpretation on 07/15/2020 at 3:16 pm to provider PHILLIP STAFFORD , who verbally acknowledged these results. Electronically Signed   By: Lowella Grip III M.D.   On: 07/15/2020 15:16      Assessment/Plan Principal Problem:   Hemorrhagic cerebrovascular accident (CVA) ([place]) Active Problems:   Hypothyroidism   Depression, major   Invasive ductal carcinoma of breast, female, right ([place])   Chronic diastolic heart failure (HCC)   Essential hypertension   PAF (paroxysmal atrial fibrillation) ([place])    Acute hemorrhagic stroke Patient presents for evaluation of dizziness and a headache for about 4 days and CT scan of the head done without contrast showed a focal area of hemorrhage with adjacent edema in the left cerebellar dentate nucleus. Frequent neuro checks every hour Continue Cardene drip with a goal systolic blood pressure less than 140 mmHg Continue atorvastatin Obtain 2D echocardiogram to assess LVEF Consult neurology We will request PT/OT/speech therapy consult once patient is stable    Hypothyroidism Continue Synthroid   History of breast cancer status post right mastectomy Continue letrozole   Depression and anxiety Continue sertraline and Xanax   Paroxysmal atrial fibrillation Patient is currently in sinus rhythm Hold Eliquis due to acute hemorrhagic stroke   DVT prophylaxis: SCD Code Status: full code Family Communication: Greater than 50% of time was spent discussing patient's condition and plan of care with her and her daughter at the bedside.  All questions and concerns have been addressed.  They verbalized understanding and agree with the plan. Disposition Plan: Back to previous home environment Consults called: Neurology Status: Inpatient.  The medical decision making for this patient was of high complexity and patient is at high risk for  clinical deterioration during this hospitalization.    Collier Bullock MD [facility]     07/16/2020, 1:50 PM

## 2020-07-16 NOTE — ED Notes (Signed)
Pt transported to MRI at this time

## 2020-07-16 NOTE — Progress Notes (Signed)
*  PRELIMINARY RESULTS* Echocardiogram 2D Echocardiogram has been performed.  Sandra [name] 07/16/2020, 2:25 PM

## 2020-07-16 NOTE — ED Notes (Addendum)
Spoke to Dr. Francine Graven. Patient has tolerated being off cardene well. Cardene to be discontinued. Patient will remain stepdown for 24 more hours for observation.

## 2020-07-16 NOTE — ED Notes (Signed)
Patient returned from MRI.

## 2020-07-16 NOTE — ED Notes (Signed)
Daughter, [name], [phone]

## 2020-07-16 NOTE — Consult Note (Addendum)
NEUROLOGY CONSULTATION NOTE   Date of service: July 16, 2020 Patient Name: Sandra [name] MRN:  [id number] DOB:  [birth year] Reason for consult: "ICH" _ _ _   _ __   _ __ _ _  __ __   _ __   __ _  History of Present Illness  Sandra [name] is a 75 y.o. female with PMH significant for anemia, GERD, hx of Breast cancer(2018), depression, celiac disease, CAD, pAfibb on Eliquis, Restless leg syndrome who presents with mild occipital headache and room spinning since 07/11/20 along with difficult with left hand clumsiness and dropping things with her left hand. She did not think too much of the symptoms and thought this was just a regular headache until daughter noticed and brought her to the ED. Endorses nausea, photophobia. Workup with CTH without contrast with a 1cm focal area of hemorrhage in the left cerebellum dentate nucleus. Her Eliquis was reversed with Kcentra and she was started on Clevidipine for blood pressure control. Repeat CTH in 6 hours with stable hemorrhage.   ROS   Constitutional Denies weight loss, fever and chills.  HEENT Denies changes in vision and hearing.  Respiratory Denies SOB and cough  CV Denies palpitations and CP   GI Denies abdominal pain, nausea, vomiting and diarrhea.   GU Denies dysuria and urinary frequency.   MSK Denies myalgia and joint pain.   Skin Denies rash and pruritus.   Neurological Mild headache but no syncope.   Psychiatric Denies recent changes in mood. Denies anxiety and depression.    Past History   Past Medical History:  Diagnosis Date  . (HFpEF) heart failure with preserved ejection fraction ([place])    a. 08/2019 Echo: EF 60-65%, no rwma, mild LVH, Gr2 DD, nl RV fxn. Nl pASP. Mildly dil LA. Mild to mod MR.  [name] Anemia   . Anxiety   . Arthritis   . BRCA gene mutation negative 10/2016   NEGATIVE: [employer]  . Breast cancer ([place]) 10/16/2016   T1c,N0;, GRADE I/III, 1.6cm. ER/PR pos  HER2 not over expressed, Right Upper Outer  . Celiac disease    . Depression   . Dyspnea    WITH EXERTION  . GERD (gastroesophageal reflux disease)    OCC  . Glaucoma    right eye  . Headache    H/O MIGRAINES AS TEENAGER  . Heart murmur    a. 08/2019 Echo: mild to mod MR.  [name] Hypertension   . Hypothyroidism   . Non-obstructive CAD (coronary artery disease)    a. 01/2017 MV: Hypertensive response. No ischemia/infarct. EF >65%; b. 09/2019 Cor CTA: Ca2+ = 9.29 (36th percentile). LAD calcified plaque (0-24%), otw nl. Multipel bilat pulm nodules up to 57m.  .Marland KitchenPAF (paroxysmal atrial fibrillation) ([place]    a. 02/2020 Zio: predominantly RSR, 65 (50-105), rare PACs/PVCs, 8 beats NSVT, multiple episodes of PAF lasting up to 1hr 269ms. Avg AF rate 130 (93-170). AF burden <1%. Triggered events = RSR, PACs, and PAF; b. CHA2DS2VASc = 6.  . Pneumonia    YEARS AGO  . Pulmonary nodules    a. 09/2019 Cor CTA: incidental finding of multiple bilat pulm nodules; b. 12/2019 High Res CT: stable, scattered solid pulm nodules.  . Raynaud's disease   . RLS (restless legs syndrome)    Past Surgical History:  Procedure Laterality Date  . ABDOMINAL HYSTERECTOMY    . APPENDECTOMY    . AXILLARY LYMPH NODE BIOPSY Right 03/05/2017   Procedure: AXILLARY LYMPH NODE  BIOPSY;  Surgeon: Robert Bellow, MD;  Location: [facility] ORS;  Service: General;  Laterality: Right;  . CATARACT EXTRACTION Bilateral   . CESAREAN SECTION    . COLONOSCOPY WITH PROPOFOL N/A 05/19/2015   Procedure: COLONOSCOPY WITH PROPOFOL;  Surgeon: Manya Silvas, MD;  [facility] ENDOSCOPY;  Service: Endoscopy;  Laterality: N/A;  . COLONOSCOPY WITH PROPOFOL N/A 08/01/2019   Procedure: COLONOSCOPY WITH PROPOFOL;  Surgeon: Lucilla Lame, MD;  Location: [facility] ENDOSCOPY;  Service: Endoscopy;  Laterality: N/A;  . EYE SURGERY    . HAND SURGERY Left   . MASTECTOMY Right 11/07/2016   RESIDUAL INVASIVE MAMMARY CARCINOMA, SUBAREOLAR ANTERIOR TO PREVIOUS   . SENTINEL NODE BIOPSY Right 11/07/2016   Procedure: SENTINEL NODE  BIOPSY;  Surgeon: Robert Bellow, MD;  Location: [facility] ORS;  Service: General;  Laterality: Right;  . SIMPLE MASTECTOMY WITH AXILLARY SENTINEL NODE BIOPSY Right 11/07/2016   6 mm ER/PR 100%; Her 2 neu not overexpressed, T1b, N0.  Surgeon: Robert Bellow, MD;  Location: [facility] ORS;  Service: General;  Laterality: Right;  . TONSILLECTOMY  AGE 20  . UPPER GI ENDOSCOPY  01/12/06   hiatus hernia   Family History  Problem Relation Age of Onset  . Cancer Mother   . Anemia Mother   . Other Mother        lymphosarcoma  . Alcohol abuse Father   . Prostate cancer Father   . Breast cancer Sister        33; 1/2 sister, shared mother.  . Cancer Sister        bile duct  . Stroke Maternal Grandmother   . Breast cancer Maternal Grandmother 68  . Heart attack Maternal Grandfather   . CAD Paternal Grandfather    Social History   Socioeconomic History  . Marital status: Married    Spouse name: Not on file  . Number of children: 2  . Years of education: Not on file  . Highest education level: Some college, no degree  Occupational History  . Occupation: retired  Tobacco Use  . Smoking status: Never Smoker  . Smokeless tobacco: Never Used  Vaping Use  . Vaping Use: Never used  Substance and Sexual Activity  . Alcohol use: Not Currently  . Drug use: No  . Sexual activity: Not Currently    Birth control/protection: None  Other Topics Concern  . Not on file  Social History Narrative  . Not on file   Social Determinants of Health   Financial Resource Strain: Low Risk   . Difficulty of Paying Living Expenses: Not hard at all  Food Insecurity: No Food Insecurity  . Worried About Charity fundraiser in the Last Year: Never true  . Ran Out of Food in the Last Year: Never true  Transportation Needs: No Transportation Needs  . Lack of Transportation (Medical): No  . Lack of Transportation (Non-Medical): No  Physical Activity: Inactive  . Days of Exercise per Week: 0 days  . Minutes of  Exercise per Session: 0 min  Stress: No Stress Concern Present  . Feeling of Stress : Only a little  Social Connections: Socially Integrated  . Frequency of Communication with Friends and Family: More than three times a week  . Frequency of Social Gatherings with Friends and Family: More than three times a week  . Attends Religious Services: More than 4 times per year  . Active Member of Clubs or Organizations: Yes  . Attends Archivist Meetings:  More than 4 times per year  . Marital Status: Married   No Known Allergies  Medications  (Not in a hospital admission)    Vitals   Vitals:   07/16/20 0600 07/16/20 0630 07/16/20 0700 07/16/20 0845  BP: (!) 109/56  (!) 117/57 128/65  Pulse: 60 61 63 64  Resp: 17 17 17 20   Temp:      TempSrc:      SpO2: 95% 94% 94% 98%  Weight:      Height:         Body mass index is 23.56 kg/m.  Physical Exam   General: Laying comfortably in bed; in no acute distress. HENT: Normal oropharynx and mucosa. Normal external appearance of ears and nose.  Neck: Supple, no pain or tenderness  CV: No JVD. No peripheral edema.  Pulmonary: Symmetric Chest rise. Normal respiratory effort.  Abdomen: Soft to touch, non-tender.  Ext: No cyanosis, edema, or deformity  Skin: No rash. Normal palpation of skin.   Musculoskeletal: Normal digits and nails by inspection. No clubbing.   Neurologic Examination  Mental status/Cognition: Alert, oriented to self, place, month and year, good attention. 2/3 recall at 5 mins. Speech/language: Fluent, comprehension intact, object naming intact, repetition intact.  Cranial nerves:   CN II Pupils equal and reactive to light, no VF deficits    CN III,IV,VI EOM intact, no gaze preference or deviation, no nystagmus    CN V normal sensation in V1, V2, and V3 segments bilaterally    CN VII no asymmetry, no nasolabial fold flattening    CN VIII normal hearing to speech    CN IX & X normal palatal elevation, no  uvular deviation    CN XI 5/5 head turn and 5/5 shoulder shrug bilaterally    CN XII midline tongue protrusion    Motor:  Muscle bulk: poor, tone normal, pronator drift none tremor none Mvmt Root Nerve  Muscle Right Left Comments  SA C5/6 Ax Deltoid 5 5   EF C5/6 Mc Biceps 5 5   EE C6/7/8 Rad Triceps 5 5   WF C6/7 Med FCR 5 5   WE C7/8 PIN ECU 5 5   F Ab C8/T1 U ADM/FDI 5 5   HF L1/2/3 Fem Illopsoas 5 5   KE L2/3/4 Fem Quad 5 5   DF L4/5 D Peron Tib Ant 5 5   PF S1/2 Tibial Grc/Sol 5 5    Reflexes:  Right Left Comments  Pectoralis      Biceps (C5/6) 2 2   Brachioradialis (C5/6) 2 2    Triceps (C6/7) 2 2    Patellar (L3/4) 2 2    Achilles (S1) 1 1    Hoffman      Plantar withdraws withdraws   Jaw jerk    Sensation:  Light touch Intact throughout   Pin prick    Temperature    Vibration   Proprioception    Coordination/Complex Motor:  - Finger to Nose with mild LUE ataxia - Heel to shin with no ataxia - Rapid alternating movement are mildly slowed in LUE compared to RUE. - Gait: Deferred.  Labs   CBC:  Recent Labs  Lab 07/15/20 1307 07/16/20 0503  WBC 5.7 5.5  HGB 11.4* 11.0*  HCT 35.6* 34.0*  MCV 89.0 87.4  PLT 210 737    Basic Metabolic Panel:  Lab Results  Component Value Date   NA 139 07/15/2020   K 3.5 07/15/2020   CO2 27 07/15/2020  GLUCOSE 119 (H) 07/15/2020   BUN 16 07/15/2020   CREATININE 0.68 07/15/2020   CALCIUM 8.9 07/15/2020   GFRNONAA >60 07/15/2020   GFRAA 95 04/28/2020   Lipid Panel:  Lab Results  Component Value Date   LDLCALC 67 04/28/2020   HgbA1c:  Lab Results  Component Value Date   HGBA1C 5.6 05/31/2016   Urine Drug Screen: No results found for: LABOPIA, COCAINSCRNUR, LABBENZ, AMPHETMU, THCU, LABBARB  Alcohol Level No results found for: [place]  CT Head without contrast: 1cm ICH in Left cerebellum dentate nucleus  Repeat CTH: Stable ICH.  MRI Brain: pending.  Impression   Sandra [name] is a 75 y.o. female  with PMH significant for  anemia, GERD, hx of Breast cancer(2018), depression, celiac disease, CAD, pAfibb on Eliquis, Restless leg syndrome who presents with mild occipital headache and room spinning since 07/11/20 along with difficult with left hand clumsiness and dropping things with her left hand. Found to have a small L cerebellum 1cm ICH s/p Kcentra and Cleviprex and repeat CTH with stable ICH.  Suspect that her [place] is likely due to a combination of hypertension and Eliquis.  Recommendations   - Admit to ICU - Stroke labs HgbA1c, fasting lipid panel - MRI brain w/ and w/o when stabilized to eval for underlying mass  - CT Angio head and neck - Frequent neuro checks, q38mn x 1 hr, then q1hr - Repeat CTH STAT if neurological decline - Echocardiogram - [place]platelets due to [place] will consider after repeat evaluation. - DVT PPx with lovenox sq at 24 hrs if stable, SCDs for now - Risk factor modification - Telemetry monitoring - Blood pressure goal SBP < 140, cleverplex and labetalol PRN  - PT consult, OT consult, Speech consult when patient stabilized. - Recommend Vit B12, Folate levels given reported hx of tiredness x 1 yr in the setting of hx of Celiac disease.   ______________________________________________________________________   Thank you for the opportunity to take part in the care of this patient. If you have any further questions, please contact the neurology consultation attending.  Signed,  SClarksburgPager Number [phone]_ _ _   _ __   _ __ _ _  __ __   _ __   __ _

## 2020-07-16 NOTE — Progress Notes (Signed)
PT Cancellation Note  Patient Details Name: Sandra [name] MRN: [id number] DOB: 1945/06/03   Cancelled Treatment:    Reason Eval/Treat Not Completed: Patient not medically ready: Per neurologist hold PT/OT eval until 07/17/20 after patient has been on bed rest for 24 hours.  Will attempt to see pt at a future date/time as medically appropriate.     Linus Salmons PT, DPT 07/16/20, 2:18 PM

## 2020-07-16 NOTE — ED Notes (Signed)
Spoke to Dr. Francine Graven. Patient to be taken off cardene, coreg and PRN labetalol for systolic BP less than 171. Neurology aware.

## 2020-07-16 NOTE — Progress Notes (Signed)
SLP Cancellation Note  Patient Details Name: Sandra [name] MRN: [id number] DOB: [birth year]   Cancelled treatment:       Reason Eval/Treat Not Completed: SLP screened, no needs identified, will sign off  Kenslie Abbruzzese B. Rutherford Nail M.S., CCC-SLP, [place] Office [phone]  Stormy Fabian 07/16/2020, 3:59 PM

## 2020-07-16 NOTE — Progress Notes (Signed)
Called by ED MD yesterday, patient has a 27m cerebellar hemorrhage without mass effect or hydro. Anticoag (eliquis) reveresed, SBP brought down, [facility]. Therefore I was [facility]. I stated they could transfer to the hospitalist as the hemorrhage is not surgical whatsoever at this point and I do not understand why she was being transferred.  Still not transferred this am, therefore I am ordering a repeat CT brain for stability.

## 2020-07-16 NOTE — ED Provider Notes (Signed)
-----------------------------------------   10:48 AM on 07/16/2020 -----------------------------------------  I assumed care of patient at 7 AM this morning, at which time she remained in stable condition, awake and alert with blood pressure well controlled on Cardene drip.  Repeat head CT was ordered by neurosurgery at [facility] and shows stable 1 cm hemorrhage in the cerebellum  No neurosurgical interventions are planned at this time and case discussed with neurology here at [facility].  Patient evaluated by Dr. Lorrin Goodell of neurology, who agrees that patient may be admitted to [facility] for further work-up.  Case discussed with Dr. Patsey Berthold in the ICU, who states patient may be admitted to stepdown given she is stable on Cardene drip that is not requiring titration.  Case discussed with hospitalist for admission.   Blake Divine, MD 07/16/20 1051

## 2020-07-16 NOTE — ED Notes (Signed)
Patient sitting up on side of bed, daughter assisted patient with brushing hair and brushing teeth.

## 2020-07-16 NOTE — ED Notes (Signed)
Spoke with Sandra [name] at [facility] [place] pt is still awaiting a bed

## 2020-07-17 DIAGNOSIS — I4891 Unspecified atrial fibrillation: Secondary | ICD-10-CM

## 2020-07-17 DIAGNOSIS — I614 Nontraumatic intracerebral hemorrhage in cerebellum: Secondary | ICD-10-CM

## 2020-07-17 HISTORY — DX: Nontraumatic intracerebral hemorrhage in cerebellum: I61.4

## 2020-07-17 LAB — ECHOCARDIOGRAM COMPLETE
AR max vel: 2.29 cm2
AV Area VTI: 2.02 cm2
AV Area mean vel: 2.13 cm2
AV Mean grad: 6 mmHg
AV Peak grad: 11.6 mmHg
Ao pk vel: 1.7 m/s
Area-P 1/2: 4.49 cm2
Height: 63 in
Weight: 2128 oz

## 2020-07-17 LAB — HEMOGLOBIN A1C
Hgb A1c MFr Bld: 6.3 % — ABNORMAL HIGH (ref 4.8–5.6)
Mean Plasma Glucose: 134.11 mg/dL

## 2020-07-17 LAB — LIPID PANEL
Cholesterol: 137 mg/dL (ref 0–200)
HDL: 50 mg/dL (ref >40)
LDL Cholesterol: 68 mg/dL (ref 0–99)
Total CHOL/HDL Ratio: 2.7 RATIO
Triglycerides: 95 mg/dL (ref <150)
VLDL: 19 mg/dL (ref 0–40)

## 2020-07-17 LAB — VITAMIN B12: Vitamin B-12: 274 pg/mL (ref 180–914)

## 2020-07-17 MED ORDER — AMLODIPINE BESYLATE 5 MG PO TABS
5.0000 mg | ORAL_TABLET | Freq: Every day | ORAL | Status: DC
Start: 2020-07-17 — End: 2020-07-18
  Administered 2020-07-17 – 2020-07-18 (×2): 5 mg via ORAL
  Filled 2020-07-17 (×2): qty 1

## 2020-07-17 MED ORDER — ENOXAPARIN SODIUM 40 MG/0.4ML ~~LOC~~ SOLN
40.0000 mg | SUBCUTANEOUS | Status: DC
  Administered 2020-07-17: 40 mg via SUBCUTANEOUS
  Filled 2020-07-17: qty 0.4

## 2020-07-17 MED ORDER — HYDRALAZINE HCL 20 MG/ML IJ SOLN: 5.0000 mg | Freq: Four times a day (QID) | INTRAMUSCULAR | Status: DC | PRN

## 2020-07-17 MED ORDER — LABETALOL HCL 5 MG/ML IV SOLN: 10.0000 mg | INTRAVENOUS | Status: DC | PRN

## 2020-07-17 NOTE — ED Notes (Signed)
PT at bedside to work with pt.

## 2020-07-17 NOTE — ED Notes (Signed)
Update to plan per Danford from Neuro: start pt on amlodipine and watch overnight.

## 2020-07-17 NOTE — ED Notes (Signed)
Confirmed with provider Danford to give lovenox.

## 2020-07-17 NOTE — ED Notes (Signed)
Briefly updated pt's daughter as requested by pt and via pt's phone which was directly handed to nurse.

## 2020-07-17 NOTE — ED Notes (Signed)
Visitor to bedside.  

## 2020-07-17 NOTE — Evaluation (Signed)
Occupational Therapy Evaluation Patient Details Name: Sandra [name] MRN: [id number] DOB: 04/10/1946 Today's Date: 07/17/2020    History of Present Illness 75 y.o. female with medical history significant for paroxysmal atrial fibrillation on anticoagulation therapy, history of breast cancer status post mastectomy, history of heart failure with preserved ejection fraction, depression, hypertension and hypothyroidism who presents to the ED for evaluation of a headache and dizziness that she has had for about 4 days. CT scan revealed CVA in left cerebellar dentate nucleus.   Clinical Impression   Ms Traub was seen for OT evaluation this date. Prior to hospital admission, pt was Independent for mobility and I/ADLs, driving. Pt lives with husband and has family/friends nearby. Pt presents to acute OT demonstrating impaired ADL performance and functional mobility 2/2 decreased activity tolerance and limited functional use of non-dominant LUE. Pt currently requires SUPERVISION for BSC t/f and perihygiene c lateral leans. MOD I for LBD seated EOB, decreasing to SUPERVISION in standing. Pt would benefit from skilled OT to address noted impairments and functional limitations (see below for any additional details) in order to maximize safety and independence while minimizing falls risk and caregiver burden. Upon hospital discharge, recommend Outpatient OT to maximize pt safety and return to functional independence during meaningful occupations of daily life.     Follow Up Recommendations  Outpatient OT    Equipment Recommendations  None recommended by OT    Recommendations for Other Services       Precautions / Restrictions Precautions Precautions: Fall Restrictions Weight Bearing Restrictions: No      Mobility Bed Mobility Overal bed mobility: Modified Independent                  Transfers Overall transfer level: Needs assistance Equipment used: None Transfers: Sit to/from  Stand;Stand Pivot Transfers Sit to Stand: Supervision Stand pivot transfers: Supervision            Balance Overall balance assessment: Needs assistance Sitting-balance support: No upper extremity supported;Feet supported Sitting balance-Leahy Scale: Good     Standing balance support: No upper extremity supported;During functional activity Standing balance-Leahy Scale: Good                             ADL either performed or assessed with clinical judgement   ADL Overall ADL's : Needs assistance/impaired                                       General ADL Comments: SUPERVISION for BSC t/f and perihygiene c lateral leans. MOD I for LBD seated EOB, decreasing to SUPERVISION in standing.                  Pertinent Vitals/Pain Pain Assessment: No/denies pain     Hand Dominance Right   Extremity/Trunk Assessment Upper Extremity Assessment Upper Extremity Assessment: LUE deficits/detail LUE Deficits / Details: strength grossly WFL. Decreased coordination FNF, RAM, and 5 finger opposition   Lower Extremity Assessment Lower Extremity Assessment: Overall WFL for tasks assessed       Communication Communication Communication: No difficulties   Cognition Arousal/Alertness: Awake/alert Behavior During Therapy: WFL for tasks assessed/performed Overall Cognitive Status: Within Functional Limits for tasks assessed  General Comments  SEATED: BP 156/84. STANDING: BP 162/90    Exercises Exercises: Other exercises Other Exercises Other Exercises: Pt and family educated re: OT role, DME recs, d/c recs, falls prevention, ECS, home/routines modifications Other Exercises: LBD, UBD, toileting, sup>sit, sit<>stand, sitting/standing balance/tolerance, self-eating, ~200 ft mobility   Shoulder Instructions      Home Living Family/patient expects to be discharged to:: Private residence Living  Arrangements: Spouse/significant other Available Help at Discharge: Family;Neighbor;Available 24 hours/day Type of Home: House Home Access: Stairs to enter [employer] of Steps: 2 Entrance Stairs-Rails: Can reach both Home Layout: Two level;Able to live on main level with bedroom/bathroom Alternate Level Stairs-Number of Steps: 14 Alternate Level Stairs-Rails: Right Bathroom Shower/Tub: Walk-in shower         Home Equipment: Shower seat;Grab bars - tub/shower   Additional Comments: Assist from granddaughter/daughter as needed      Prior Functioning/Environment Level of Independence: Independent        Comments: 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        OT Problem List: Impaired balance (sitting and/or standing);Decreased coordination      OT Treatment/Interventions: Self-care/ADL training;Therapeutic exercise;DME and/or AE instruction;Energy conservation;Therapeutic activities;Patient/family education;Balance training    OT Goals(Current goals can be found in the care plan section) Acute Rehab OT Goals Patient Stated Goal: To return home OT Goal Formulation: With patient Time For Goal Achievement: 07/31/20 Potential to Achieve Goals: Good  OT Frequency: Min 1X/week    AM-PAC OT "6 Clicks" Daily Activity     Outcome Measure Help from another person eating meals?: None Help from another person taking care of personal grooming?: None Help from another person toileting, which includes using toliet, bedpan, or urinal?: A Little Help from another person bathing (including washing, rinsing, drying)?: A Little Help from another person to put on and taking off regular upper body clothing?: A Little Help from another person to put on and taking off regular lower body clothing?: A Little 6 Click Score: 20   End of Session Equipment Utilized During Treatment: Gait belt Nurse Communication: Mobility status  Activity Tolerance: Patient tolerated  treatment well Patient left: in bed;with call bell/phone within reach;Other (comment) (MD in room)  OT Visit Diagnosis: Other abnormalities of gait and mobility (R26.89);Muscle weakness (generalized) (M62.81);Hemiplegia and hemiparesis Hemiplegia - Right/Left: Left Hemiplegia - dominant/non-dominant: Non-Dominant Hemiplegia - caused by: Cerebral infarction                Time: 1210-1310 OT Time Calculation (min): 60 min Charges:  OT General Charges $OT Visit: 1 Visit OT Evaluation $OT Eval Moderate Complexity: 1 Mod OT Treatments $Self Care/Home Management : 38-52 mins $Therapeutic Activity: 8-22 mins  Dessie Coma, M.S. OTR/L  07/17/20, 3:06 PM  ascom [phone]

## 2020-07-17 NOTE — ED Notes (Signed)
Dietary dropped off pt's lunch tray at bedside. Pt currently walking hallways with OT.

## 2020-07-17 NOTE — ED Notes (Signed)
PT still working with pt. ED Float RN to take pt up to room soon.

## 2020-07-17 NOTE — ED Notes (Signed)
Per attending Danford, pt likely to be d/c soon. Danford just spoke with pt at bedside. Danford stated will call pt's daughter as requested by pt/daughter.

## 2020-07-17 NOTE — Evaluation (Signed)
Physical Therapy Evaluation Patient Details Name: Sandra [name] MRN: [id number] DOB: December 13, 1945 Today's Date: 07/17/2020   History of Present Illness  Pt is a 75 y.o. female with medical history significant for paroxysmal atrial fibrillation on anticoagulation therapy, history of breast cancer status post mastectomy, history of heart failure with preserved ejection fraction, depression, hypertension and hypothyroidism who presents to the ED for evaluation of a headache and dizziness that she has had for about 4 days. 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    Clinical Impression  Pt was pleasant and motivated to participate during the session.  Pt presented with no significant strength deficits with strength grossly equal L vs R to the UEs and LEs.  Pt presented with no visual deficits including VORs, tracking, and peripheral vision.  Pt did present with minor deficits to gross and fine motor coordination to the LUE and with minor to moderate balance deficits.  Pt was able to amb 300+ feet without an AD but drifted significantly to the right with head turns.  Pt practiced side stepping and backwards walking with fair stability but required assistance to prevent LOB in semi-tandem with eyes closed and while in stemi-tandem with head turns.  Max SLS standing time was around 2 seconds both L and R.  Pt advised to use a RW until cleared by OPPT with pt's daughter in agreement and stating that she would purchase a RW independently.  Pt will benefit from OPPT services upon discharge to safely address deficits listed in patient problem list for decreased caregiver assistance and eventual return to PLOF.      Follow Up Recommendations Outpatient PT;Supervision for mobility/OOB    Equipment Recommendations  None recommended by PT;Other (comment) (Family to purchase a RW independently)    Recommendations for Other Services       Precautions / Restrictions  Precautions Precautions: Fall Restrictions Weight Bearing Restrictions: No      Mobility  Bed Mobility Overal bed mobility: Modified Independent                  Transfers Overall transfer level: Needs assistance Equipment used: None Transfers: Sit to/from Stand Sit to Stand: Supervision Stand pivot transfers: Supervision       General transfer comment: Good eccentric and concentric control and stability  Ambulation/Gait Ambulation/Gait assistance: Min guard Gait Distance (Feet): 300 Feet Assistive device: None Gait Pattern/deviations: Step-through pattern;Decreased stride length;Drifts right/left Gait velocity: decreased   General Gait Details: Min to mod drifting mostly to the R during dynamic gait training most notably with head turns  Science writer    Modified Rankin (Stroke Patients Only)       Balance Overall balance assessment: Needs assistance Sitting-balance support: No upper extremity supported;Feet supported Sitting balance-Leahy Scale: Normal     Standing balance support: No upper extremity supported;During functional activity Standing balance-Leahy Scale: Fair   Single Leg Stance - Right Leg: 2 Single Leg Stance - Left Leg: 2         High level balance activites: Side stepping;Backward walking;Sudden stops;Turns;Head turns High Level Balance Comments: Min A for stability during semi-tandem standing with eyes closed and with head turns             Pertinent Vitals/Pain Pain Assessment: No/denies pain    Home Living Family/patient expects to be discharged to:: Private residence Living Arrangements: Spouse/significant other Available Help at Discharge: Family;Neighbor;Available 24 hours/day Type  of Home: House Home Access: Stairs to enter Entrance Stairs-Rails: Right;Left (Too wide for both) Entrance Stairs-Number of Steps: 2 Home Layout: Two level;Able to live on main level with  bedroom/bathroom Home Equipment: Shower seat;Grab bars - tub/shower Additional Comments: Assist from granddaughter/daughter as needed    Prior Function Level of Independence: Independent         Comments: Ind amb community distances without an AD, one fall in the last 6 months secondary to tripping while carrying propane tank, caregiver for her husband (COPD), does all I/ADLs.     Hand Dominance   Dominant Hand: Right    Extremity/Trunk Assessment   Upper Extremity Assessment Upper Extremity Assessment: LUE deficits/detail LUE Deficits / Details: Strength grossly WFL. Mildly decreased coordination FTN, RAM, and 5 finger opposition compared to dominant R hand    Lower Extremity Assessment Lower Extremity Assessment: Overall WFL for tasks assessed       Communication   Communication: No difficulties  Cognition Arousal/Alertness: Awake/alert Behavior During Therapy: WFL for tasks assessed/performed Overall Cognitive Status: Within Functional Limits for tasks assessed                                        General Comments General comments (skin integrity, edema, etc.): SEATED: BP 156/84. STANDING: BP 162/90    Exercises Other Exercises Other Exercises: Corner balance exercises education with pt and daughter including need for spotter and principles of intensity progression Other Exercises: LBD, UBD, toileting, sup>sit, sit<>stand, sitting/standing balance/tolerance, self-eating, ~200 ft mobility   Assessment/Plan    PT Assessment Patient needs continued PT services  PT Problem List Decreased balance;Decreased coordination;Decreased knowledge of use of DME       PT Treatment Interventions DME instruction;Gait training;Stair training;Functional mobility training;Therapeutic activities;Therapeutic exercise;Balance training;Neuromuscular re-education;Patient/family education    PT Goals (Current goals can be found in the Care Plan section)  Acute Rehab  PT Goals Patient Stated Goal: To return home PT Goal Formulation: With patient Time For Goal Achievement: 07/30/20 Potential to Achieve Goals: Good    Frequency Min 2X/week   Barriers to discharge        Co-evaluation               AM-PAC PT "6 Clicks" Mobility  Outcome Measure Help needed turning from your back to your side while in a flat bed without using bedrails?: None Help needed moving from lying on your back to sitting on the side of a flat bed without using bedrails?: None Help needed moving to and from a bed to a chair (including a wheelchair)?: A Little Help needed standing up from a chair using your arms (e.g., wheelchair or bedside chair)?: A Little Help needed to walk in hospital room?: A Little Help needed climbing 3-5 steps with a railing? : A Little 6 Click Score: 20    End of Session Equipment Utilized During Treatment: Gait belt Activity Tolerance: Patient tolerated treatment well Patient left: in bed;with family/visitor present;with call bell/phone within reach Nurse Communication: Mobility status PT Visit Diagnosis: Unsteadiness on feet (R26.81);Difficulty in walking, not elsewhere classified (R26.2);Other symptoms and signs involving the nervous system (R29.898)    Time: 1348-1430 PT Time Calculation (min) (ACUTE ONLY): 42 min   Charges:   PT Evaluation $PT Eval Moderate Complexity: 1 Mod PT Treatments $Gait Training: 8-22 mins $Therapeutic Exercise: 8-22 mins        D. Scott Zavon Hyson  PT, DPT 07/17/20, 4:14 PM

## 2020-07-17 NOTE — ED Notes (Signed)
OT at bedside. 

## 2020-07-17 NOTE — Progress Notes (Signed)
[facility] PROGRESS NOTE    Sandra [name]  EHM:[id number] DOB: [birth year] DOA: 07/15/2020 PCP: Jerrol Banana., MD      Brief Narrative:  Sandra [name] is a 75 y.o. F with dCHF, pAF on Eliquis, BrCA remote s/p mastectomy, HTN no longer on BP meds, and hypothyroidism who presented with 1 week headache and dizziness.  Evidently called 9-1-1 on 2/10 because she woke in the middle of the night with tachycardia/palpitations, severe headache and "feeling off".  EMS recommended ER evaluation but she declined.  Her symptoms persisted however, and so eventually she told her daughter and her Cardiologist, both of whom recommended she go to the ER, which she eventually agreed to do on 2/17.  There, CT head showed a subcentimeter LEFT cerebellar intracerebral hemorrhage without mass or mass effect.    2/17: CT head shows bleed --> given Kcenra to reverse Eliquis, started on Nicardipine 2/18: Hospitalist and Neurology consulted, MRI/MRA showed no suspicious mass, and no nearby aneurysm         Assessment & Plan:  Intraparenchymal LEFT cerebellar hemorrage Admitted and started on nicardipine drip.  BP easy to control, Cardene tapered off.  Neuro status stable for 48 hours now. MRI/MRA brain shows no aneurysm or underlying mass. LDL <70, folate normal, echo shows normal EF, grade II DD, normal valves, no cardiogenic source of embolism.  -Continue BP control, target <160 per Dr. Lorrin Goodell -Start amlodipine -Hold Eliquis and antiplatelets for now -Follow up B12 -Continue atorvastatin   -Echocardiogram showed no cardiogenic source of embolism -Carotid imaging unremarkable   -Lipids ordered, on atorvastatin -Aspirin not ordered at admission due to intracerebral hemorrhage -Atrial fibrillation: previously known -tPA not given because active bleeding -Dysphagia screen ordered in ER -PT eval ordered: recommended outpatient rehab     Hypertension Had been  tapered off antihypertensives last year, and review of notes in the interim shows excellent BP control on carvedilol alone -Continue carvedilol -Continue PRN hyrdalazine for SBP > 160 -Start amlodipine  Paroxysmal atrial fibrillation HR controlled -Hold Eliquis -Continue carvedilol   Pre-diabetes A1c 6.2%.  Anxiety -Continue Xanax and sertraline  Hypothyroidism -Continue levothyrooxine  GERD -Continue PPI  History breast cancer -Continue Femara    Normocytic anemia Follow up with PCP               Disposition: Status is: Inpatient  Remains inpatient appropriate because:Inpatient level of care appropriate due to severity of illness   Dispo: The patient is from: Home              Anticipated d/c is to: Home              Anticipated d/c date is: 1 day              Patient currently is not medically stable to d/c.   Difficult to place patient No       Level of care: Med-Surg       MDM: The below labs and imaging reports were reviewed and summarized above.  Medication management as above.    DVT prophylaxis: enoxaparin (LOVENOX) injection 40 mg Start: 07/17/20 1200  Code Status: FULL Family Communication: daughter at bedside    Consultants:   Neurology         Subjective: Feeling well, she still has a little bit of depth perception problems, some cognitive slowing she feels, but this is subtle.  No fever, no headache, no focal weakness.  No vomiting or confusion.  Objective: Vitals:   07/17/20 1348 07/17/20 1348 07/17/20 1447 07/17/20 1619  BP: (!) 150/84 (!) 150/84 (!) 156/75 (!) 155/71  Pulse:   62 (!) 58  Resp:   17 17  Temp:   97.6 F (36.4 C) 97.6 F (36.4 C)  TempSrc:   Oral   SpO2:   100% 98%  Weight:      Height:       No intake or output data in the 24 hours ending 07/17/20 1717 Filed Weights   07/15/20 1304  Weight: 60.3 kg    Examination: General appearance: Elderly adult female, alert and in no acute  distress.  Sitting on the edge of the bed HEENT: Anicteric, conjunctiva pink, lids and lashes normal. No nasal deformity, discharge, epistaxis.  Lips moist, oropharynx moist, no oral lesions, hearing normal.   Skin: Warm and dry.  No jaundice.  No suspicious rashes or lesions. Cardiac: RRR, nl S1-S2, no murmurs appreciated.  Capillary refill is brisk.  No carotid bruits.  No LE edema.  Radial pulses 2+ and symmetric. Respiratory: Normal respiratory rate and rhythm.  CTAB without rales or wheezes. Abdomen: Abdomen soft.  No TTP or guarding. No ascites, distension, hepatosplenomegaly.   MSK: No deformities or effusions. Neuro: Pupils are 3 mm and reactive to 2 mm.  Extraocular movements are intact, without nystagmus.  Cranial nerve 5 is within normal limits.  Cranial nerve 7 is symmetrical.  Cranial nerve 8 is within normal limits.  Cranial nerves 9 and 10 reveal equal palate elevation.  Cranial nerve 11 reveals sternocleidomastoid strong.  Cranial nerve 12 is midline.  Motor strength testing is 5/5 in the upper and lower extremities bilaterally with normal motor, tone and bulk.  Sensory examination is intact to light touch.  Romberg maneuver is negative for pathology.  Finger-to-nose testing is obviously ataxic on the left. The patient is oriented to time, place and person.  Speech is fluent.  Naming is grossly intact.  Recall, recent and remote, as well as general fund of knowledge seem within normal limits.  Attention span and concentration are within normal limits.  Psych: Sensorium intact and responding to questions, attention normal. Affect normal.  Judgment and insight appear normal.    Data Reviewed: I have personally reviewed following labs and imaging studies:  CBC: Recent Labs  Lab 07/15/20 1307 07/16/20 0503  WBC 5.7 5.5  HGB 11.4* 11.0*  HCT 35.6* 34.0*  MCV 89.0 87.4  PLT 210 725   Basic Metabolic Panel: Recent Labs  Lab 07/15/20 1307  NA 139  K 3.5  CL 103  CO2 27   GLUCOSE 119*  BUN 16  CREATININE 0.68  CALCIUM 8.9   GFR: Estimated Creatinine Clearance: 50.3 mL/min (by C-G formula based on SCr of 0.68 mg/dL). Liver Function Tests: No results for input(s): AST, ALT, ALKPHOS, BILITOT, PROT, ALBUMIN in the last 168 hours. No results for input(s): LIPASE, AMYLASE in the last 168 hours. No results for input(s): AMMONIA in the last 168 hours. Coagulation Profile: Recent Labs  Lab 07/15/20 1549 07/16/20 1252  INR 1.4* 1.1   Cardiac Enzymes: No results for input(s): CKTOTAL, CKMB, CKMBINDEX, TROPONINI in the last 168 hours. BNP (last 3 results) No results for input(s): PROBNP in the last 8760 hours. HbA1C: Recent Labs    07/16/20 1252 07/17/20 0436  HGBA1C 6.2* 6.3*   CBG: No results for input(s): GLUCAP in the last 168 hours. Lipid Profile: Recent Labs    07/16/20 1252 07/17/20 0436  CHOL 143 137  HDL 49 50  LDLCALC 75 68  TRIG 95 95  CHOLHDL 2.9 2.7   Thyroid Function Tests: No results for input(s): TSH, T4TOTAL, FREET4, T3FREE, THYROIDAB in the last 72 hours. Anemia Panel: Recent Labs    07/16/20 1252 07/17/20 0851  VITAMINB12  --  274  FOLATE 32.0  --    Urine analysis:    Component Value Date/Time   COLORURINE STRAW (A) 07/15/2020 1549   APPEARANCEUR CLEAR (A) 07/15/2020 1549   LABSPEC 1.003 (L) 07/15/2020 1549   PHURINE 7.0 07/15/2020 1549   GLUCOSEU NEGATIVE 07/15/2020 1549   HGBUR NEGATIVE 07/15/2020 1549   BILIRUBINUR NEGATIVE 07/15/2020 1549   BILIRUBINUR Negative 11/24/2019 1352   KETONESUR NEGATIVE 07/15/2020 1549   PROTEINUR NEGATIVE 07/15/2020 1549   UROBILINOGEN 0.2 11/24/2019 1352   NITRITE NEGATIVE 07/15/2020 1549   LEUKOCYTESUR NEGATIVE 07/15/2020 1549   Sepsis Labs: @LABRCNTIP (procalcitonin:4,lacticacidven:4)  ) Recent Results (from the past 240 hour(s))  Resp Panel by RT-PCR (Flu A&B, Covid) Nasopharyngeal Swab     Status: None   Collection Time: 07/15/20  5:45 PM   Specimen:  Nasopharyngeal Swab; Nasopharyngeal(NP) swabs in vial transport medium  Result Value Ref Range Status   SARS Coronavirus 2 by RT PCR NEGATIVE NEGATIVE Final    Comment: (NOTE) SARS-CoV-2 target nucleic acids are NOT DETECTED.  The SARS-CoV-2 RNA is generally detectable in upper respiratory specimens during the acute phase of infection. The lowest concentration of SARS-CoV-2 viral copies this assay can detect is 138 copies/mL. A negative result does not preclude SARS-Cov-2 infection and should not be used as the sole basis for treatment or other patient management decisions. A negative result may occur with  improper specimen collection/handling, submission of specimen other than nasopharyngeal swab, presence of viral mutation(s) within the areas targeted by this assay, and inadequate number of viral copies(<138 copies/mL). A negative result must be combined with clinical observations, patient history, and epidemiological information. The expected result is Negative.  Fact Sheet for Patients:  EntrepreneurPulse.com.au  Fact Sheet for Healthcare Providers:  IncredibleEmployment.be  This test is no t yet approved or cleared by the [place] FDA and  has been authorized for detection and/or diagnosis of SARS-CoV-2 by FDA under an Emergency Use Authorization (EUA). This EUA will remain  in effect (meaning this test can be used) for the duration of the COVID-19 declaration under Section 564(b)(1) of the Act, 21 U.S.C.section 360bbb-3(b)(1), unless the authorization is terminated  or revoked sooner.       Influenza A by PCR NEGATIVE NEGATIVE Final   Influenza B by PCR NEGATIVE NEGATIVE Final    Comment: (NOTE) The Xpert Xpress SARS-CoV-2/FLU/RSV plus assay is intended as an aid in the diagnosis of influenza from Nasopharyngeal swab specimens and should not be used as a sole basis for treatment. Nasal washings and aspirates are unacceptable for  Xpert Xpress SARS-CoV-2/FLU/RSV testing.  Fact Sheet for Patients: EntrepreneurPulse.com.au  Fact Sheet for Healthcare Providers: IncredibleEmployment.be  This test is not yet approved or cleared by the [place] FDA and has been authorized for detection and/or diagnosis of SARS-CoV-2 by FDA under an Emergency Use Authorization (EUA). This EUA will remain in effect (meaning this test can be used) for the duration of the COVID-19 declaration under Section 564(b)(1) of the Act, 21 U.S.C. section 360bbb-3(b)(1), unless the authorization is terminated or revoked.  Performed at [facility], [address], [place], [place] [address]  Radiology Studies: CT HEAD WO CONTRAST  Result Date: 07/16/2020 CLINICAL DATA:  Follow-up CT EXAM: CT HEAD WITHOUT CONTRAST TECHNIQUE: Contiguous axial images were obtained from the base of the skull through the vertex without intravenous contrast. COMPARISON:  Yesterday FINDINGS: Brain: 1 cm hemorrhage with mild adjacent edema in the deep left cerebellum. Often, hemorrhage in this location is hypertensive, but no excessive small-vessel ischemic change for age. No cortical infarct, hydrocephalus, or collection. Vascular: No hyperdense vessel or unexpected calcification. Skull: Normal. Negative for fracture or focal lesion. Sinuses/Orbits: No acute finding. IMPRESSION: Unchanged 1 cm hemorrhage in the deep left cerebellum. Electronically Signed   By: Monte Fantasia M.D.   On: 07/16/2020 07:58   MR ANGIO HEAD WO CONTRAST  Result Date: 07/16/2020 CLINICAL DATA:  Headache, intracranial hemorrhage suspected; neuro deficit, acute, stroke suspected; brain mass or lesion. Additional history obtained from [place] presenting with headache and dizziness. History of paroxysmal atrial fibrillation on anticoagulation therapy, history of breast cancer status post mastectomy, history of  heart failure with preserved ejection fraction, hypertension EXAM: MRI HEAD WITHOUT AND WITH CONTRAST MRA HEAD WITHOUT CONTRAST TECHNIQUE: Multiplanar, multiecho pulse sequences of the brain and surrounding structures were obtained without and with intravenous contrast. Angiographic images of the head were obtained using MRA technique without contrast. CONTRAST:  16m GADAVIST GADOBUTROL 1 MMOL/ML IV SOLN COMPARISON:  Prior head CT examinations 07/16/2020 and earlier. Brain MRI 06/16/2004. FINDINGS: MRI HEAD FINDINGS Brain: Mild cerebral and cerebellar atrophy. Unchanged 10 mm acute/early subacute parenchymal hemorrhage within the left cerebellar hemisphere with mild surrounding edema. No suspicious mass-like or nodular enhancement at this site. Mild multifocal T2/FLAIR hyperintensity within the cerebral white matter and pons is nonspecific, but compatible with chronic small vessel ischemic disease. Chronic lacunar infarcts within the right centrum semiovale (series 15, image 37) and right cerebellar hemisphere (series 10, image 7) are new from the prior MRI of 06/16/2004 No evidence of acute ischemic infarct. No extra-axial fluid collection. No midline shift. No abnormal intracranial enhancement. Vascular: Expected proximal arterial flow voids. Skull and upper cervical spine: No focal marrow lesion. Sinuses/Orbits: Visualized orbits show no acute finding. Bilateral maxillary sinus mucous retention cysts. Minimal ethmoid sinus mucosal thickening. MRA HEAD FINDINGS The intracranial internal carotid arteries are patent. The M1 middle cerebral arteries are patent. No M2 proximal branch occlusion or high-grade proximal stenosis is identified. The anterior cerebral arteries are patent. Strongly dominant A1 left anterior cerebral artery with possible azygos configuration of the A2 and A3 anterior cerebral arteries. 1-2 mm inferiorly projecting vascular protrusion arising from the paraclinoid left ICA, which may reflect an  infundibulum or aneurysm (series 1048, image 191) (series 1, image 101). The intracranial vertebral arteries are patent. The basilar artery is patent. The posterior cerebral arteries are patent. No evidence of aneurysm or vascular malformation in the region of the left cerebellar parenchymal hemorrhage. IMPRESSION: MRI brain: 1. Unchanged 10 mm acute/early subacute left cerebellar parenchymal hemorrhage with mild surrounding edema. No suspicious mass-like or nodular enhancement at this site. 2. Mild generalized atrophy of the brain and chronic small vessel ischemic disease, progressed as compared to the brain MRI of 06/16/2004. Chronic lacunar infarcts within the right frontal white matter and right cerebellum are new from this prior exam. 3. Bilateral maxillary sinus mucous retention cysts. MRA head 1. No aneurysm or vascular malformation is identified in the region of the left cerebellar parenchymal hemorrhage. 2. No intracranial large vessel occlusion or proximal high-grade arterial stenosis. 3. 1-2 mm  inferiorly projecting vascular protrusion arising from the paraclinoid left ICA, which may reflect an aneurysm or infundibulum. Electronically Signed   By: Kellie Simmering DO   On: 07/16/2020 15:43   MR ANGIO NECK W WO CONTRAST  Result Date: 07/16/2020 CLINICAL DATA:  Neuro deficit, acute, stroke suspected; carotid stenosis screening, risk factors. EXAM: MRA NECK WITHOUT AND WITH CONTRAST TECHNIQUE: Multiplanar and multiecho pulse sequences of the neck were obtained without and with intravenous contrast. Angiographic images of the neck were obtained using MRA technique without and with intravenous contrast. CONTRAST:  88m GADAVIST GADOBUTROL 1 MMOL/ML IV SOLN COMPARISON:  No pertinent prior exams available for comparison. FINDINGS: Standard aortic branching. The visualized aortic arch is unremarkable. The bilateral common and internal carotid arteries are patent within the neck without hemodynamically significant  stenosis (50% or greater). The vertebral arteries are patent within the neck with antegrade flow bilaterally. The right vertebral artery is dominant. Apparent at least moderate stenosis at the origin of the left vertebral artery. IMPRESSION: The common and internal carotid arteries are patent within the neck without hemodynamically significant stenosis. The vertebral arteries are patent within the neck with antegrade flow. Stenosis at the origin of the non-dominant left vertebral artery, which appears at least moderate in severity. Electronically Signed   By: KKellie SimmeringDO   On: 07/16/2020 15:53   MR BRAIN W WO CONTRAST  Result Date: 07/16/2020 CLINICAL DATA:  Headache, intracranial hemorrhage suspected; neuro deficit, acute, stroke suspected; brain mass or lesion. Additional history obtained from [place]upresenting with headache and dizziness. History of paroxysmal atrial fibrillation on anticoagulation therapy, history of breast cancer status post mastectomy, history of heart failure with preserved ejection fraction, hypertension EXAM: MRI HEAD WITHOUT AND WITH CONTRAST MRA HEAD WITHOUT CONTRAST TECHNIQUE: Multiplanar, multiecho pulse sequences of the brain and surrounding structures were obtained without and with intravenous contrast. Angiographic images of the head were obtained using MRA technique without contrast. CONTRAST:  636mGADAVIST GADOBUTROL 1 MMOL/ML IV SOLN COMPARISON:  Prior head CT examinations 07/16/2020 and earlier. Brain MRI 06/16/2004. FINDINGS: MRI HEAD FINDINGS Brain: Mild cerebral and cerebellar atrophy. Unchanged 10 mm acute/early subacute parenchymal hemorrhage within the left cerebellar hemisphere with mild surrounding edema. No suspicious mass-like or nodular enhancement at this site. Mild multifocal T2/FLAIR hyperintensity within the cerebral white matter and pons is nonspecific, but compatible with chronic small vessel ischemic disease. Chronic lacunar  infarcts within the right centrum semiovale (series 15, image 37) and right cerebellar hemisphere (series 10, image 7) are new from the prior MRI of 06/16/2004 No evidence of acute ischemic infarct. No extra-axial fluid collection. No midline shift. No abnormal intracranial enhancement. Vascular: Expected proximal arterial flow voids. Skull and upper cervical spine: No focal marrow lesion. Sinuses/Orbits: Visualized orbits show no acute finding. Bilateral maxillary sinus mucous retention cysts. Minimal ethmoid sinus mucosal thickening. MRA HEAD FINDINGS The intracranial internal carotid arteries are patent. The M1 middle cerebral arteries are patent. No M2 proximal branch occlusion or high-grade proximal stenosis is identified. The anterior cerebral arteries are patent. Strongly dominant A1 left anterior cerebral artery with possible azygos configuration of the A2 and A3 anterior cerebral arteries. 1-2 mm inferiorly projecting vascular protrusion arising from the paraclinoid left ICA, which may reflect an infundibulum or aneurysm (series 1048, image 191) (series 1, image 101). The intracranial vertebral arteries are patent. The basilar artery is patent. The posterior cerebral arteries are patent. No evidence of aneurysm or vascular malformation in the region of  the left cerebellar parenchymal hemorrhage. IMPRESSION: MRI brain: 1. Unchanged 10 mm acute/early subacute left cerebellar parenchymal hemorrhage with mild surrounding edema. No suspicious mass-like or nodular enhancement at this site. 2. Mild generalized atrophy of the brain and chronic small vessel ischemic disease, progressed as compared to the brain MRI of 06/16/2004. Chronic lacunar infarcts within the right frontal white matter and right cerebellum are new from this prior exam. 3. Bilateral maxillary sinus mucous retention cysts. MRA head 1. No aneurysm or vascular malformation is identified in the region of the left cerebellar parenchymal hemorrhage.  2. No intracranial large vessel occlusion or proximal high-grade arterial stenosis. 3. 1-2 mm inferiorly projecting vascular protrusion arising from the paraclinoid left ICA, which may reflect an aneurysm or infundibulum. Electronically Signed   By: Kellie Simmering DO   On: 07/16/2020 15:43   ECHOCARDIOGRAM COMPLETE  Result Date: 07/17/2020    ECHOCARDIOGRAM REPORT   Patient Name:   DEETTA SIEGMANN [facility] Date of Exam: 07/16/2020 Medical Rec #:  [id number]      Height:       63.0 in Accession #:    [id number]     Weight:       133.0 lb Date of Birth:  [birth year]     BSA:          1.626 m Patient Age:    67 years       BP:           121/60 mmHg Patient Gender: F              HR:           66 bpm. Exam Location:  [facility] Procedure: 2D Echo, Cardiac Doppler, Color Doppler and Strain Analysis Indications:     Stroke 434.91 / I163.9  History:         Patient has prior history of Echocardiogram examinations, most                  recent 09/12/2019. Signs/Symptoms:Murmur; Risk                  Factors:Hypertension. PAF.  Sonographer:     Sherrie Sport RDCS (AE) Referring Phys:  [id number] [facility] Diagnosing Phys: Serafina Royals MD  Sonographer Comments: Global longitudinal strain was attempted. IMPRESSIONS  1. Left ventricular ejection fraction, by estimation, is 65 to 70%. The left ventricle has normal function. The left ventricle has no regional wall motion abnormalities. There is moderate left ventricular hypertrophy. Left ventricular diastolic parameters are consistent with Grade II diastolic dysfunction (pseudonormalization).  2. Right ventricular systolic function is normal. The right ventricular size is normal.  3. Left atrial size was mildly dilated.  4. Right atrial size was mildly dilated.  5. The mitral valve is normal in structure. Mild mitral valve regurgitation.  6. The aortic valve is normal in structure. Aortic valve regurgitation is trivial. FINDINGS  Left Ventricle: Left ventricular ejection fraction, by  estimation, is 65 to 70%. The left ventricle has normal function. The left ventricle has no regional wall motion abnormalities. The left ventricular internal cavity size was small. There is moderate  left ventricular hypertrophy. Left ventricular diastolic parameters are consistent with Grade II diastolic dysfunction (pseudonormalization). Right Ventricle: The right ventricular size is normal. No increase in right ventricular wall thickness. Right ventricular systolic function is normal. Left Atrium: Left atrial size was mildly dilated. Right Atrium: Right atrial size was mildly dilated. Pericardium: There is no evidence of pericardial effusion. Mitral Valve:  The mitral valve is normal in structure. Mild mitral valve regurgitation. Tricuspid Valve: The tricuspid valve is normal in structure. Tricuspid valve regurgitation is mild. Aortic Valve: The aortic valve is normal in structure. Aortic valve regurgitation is trivial. Aortic valve mean gradient measures 6.0 mmHg. Aortic valve peak gradient measures 11.6 mmHg. Aortic valve area, by VTI measures 2.02 cm. Pulmonic Valve: The pulmonic valve was normal in structure. Pulmonic valve regurgitation is not visualized. Aorta: The aortic root and ascending aorta are structurally normal, with no evidence of dilitation. IAS/Shunts: No atrial level shunt detected by color flow Doppler.  LEFT VENTRICLE PLAX 2D LVIDd:         3.56 cm  Diastology LV PW:         1.49 cm  LV e' medial:    7.51 cm/s LV IVS:        1.06 cm  LV E/e' medial:  11.5 LVOT diam:     2.00 cm  LV e' lateral:   6.42 cm/s LV SV:         69       LV E/e' lateral: 13.5 LV SV Index:   43 LVOT Area:     3.14 cm  RIGHT VENTRICLE RV Basal diam:  4.11 cm RV S prime:     13.80 cm/s TAPSE (M-mode): 3.4 cm LEFT ATRIUM           Index       RIGHT ATRIUM           Index LA diam:      4.10 cm 2.52 cm/m  RA Area:     12.00 cm LA Vol (A2C): 21.2 ml 13.04 ml/m RA Volume:   22.10 ml  13.59 ml/m LA Vol (A4C): 72.8 ml  44.78 ml/m  AORTIC VALVE                    PULMONIC VALVE AV Area (Vmax):    2.29 cm     PV Vmax:        0.99 m/s AV Area (Vmean):   2.13 cm     PV Peak grad:   3.9 mmHg AV Area (VTI):     2.02 cm     RVOT Peak grad: 4 mmHg AV Vmax:           170.00 cm/s AV Vmean:          115.000 cm/s AV VTI:            0.343 m AV Peak Grad:      11.6 mmHg AV Mean Grad:      6.0 mmHg LVOT Vmax:         124.00 cm/s LVOT Vmean:        78.100 cm/s LVOT VTI:          0.220 m LVOT/AV VTI ratio: 0.64  AORTA Ao Root diam: 3.13 cm MITRAL VALVE               TRICUSPID VALVE MV Area (PHT): 4.49 cm    TR Peak grad:   22.8 mmHg MV Decel Time: 169 msec    TR Vmax:        239.00 cm/s MV E velocity: 86.50 cm/s MV A velocity: 57.80 cm/s  SHUNTS MV E/A ratio:  1.50        Systemic VTI:  0.22 m  Systemic Diam: 2.00 cm Serafina Royals MD Electronically signed by Serafina Royals MD Signature Date/Time: 07/17/2020/7:47:38 AM    Final         Scheduled Meds: .  stroke: mapping our early stages of recovery book   Does not apply Once  . ALPRAZolam  0.5 mg Oral QHS  . amLODipine  5 mg Oral Daily  . atorvastatin  40 mg Oral Daily  . calcium-vitamin D  2 tablet Oral Daily  . carvedilol  6.25 mg Oral BID WC  . enoxaparin (LOVENOX) injection  40 mg Subcutaneous Q24H  . latanoprost  1 drop Both Eyes QHS  . letrozole  2.5 mg Oral Daily  . levothyroxine  100 mcg Oral QAC breakfast  . multivitamin with minerals  1 tablet Oral Q1500  . pantoprazole  40 mg Oral Daily  . senna-docusate  1 tablet Oral BID  . sertraline  100 mg Oral Daily  . timolol  1 drop Both Eyes q morning   Continuous Infusions:    LOS: 1 day    Time spent: 35 minutes    Edwin Dada, MD [facility] 07/17/2020, 5:17 PM     Please page though [place] or Epic secure chat:  For [employer], Adult nurse

## 2020-07-18 DIAGNOSIS — I619 Nontraumatic intracerebral hemorrhage, unspecified: Secondary | ICD-10-CM | POA: Diagnosis not present

## 2020-07-18 DIAGNOSIS — I4891 Unspecified atrial fibrillation: Secondary | ICD-10-CM | POA: Diagnosis not present

## 2020-07-18 MED ORDER — VITAMIN B-12 1000 MCG PO TABS: 1000.0000 ug | ORAL_TABLET | Freq: Every day | ORAL | Status: DC

## 2020-07-18 MED ORDER — AMLODIPINE BESYLATE 5 MG PO TABS: 5.0000 mg | ORAL_TABLET | Freq: Every day | ORAL | 3 refills | Status: DC

## 2020-07-18 NOTE — Progress Notes (Signed)
NEUROLOGY CONSULTATION PROGRESS NOTE   Date of service: July 18, 2020 Patient Name: Sandra [name] MRN:  [id number] DOB:  [birth year]  Brief HPI   Sandra [name] is a 75 y.o. female with PMH significant for  anemia, GERD, hx of Breast cancer(2018), depression, celiac disease, CAD, pAfibb on Eliquis, Restless leg syndrome who presents with mild occipital headache and room spinning since 07/11/20 along with difficult with left hand clumsiness and dropping things with her left hand. Found to have a small L cerebellum 1cm ICH s/p Kcentra and Nicardipine and repeat CTH with stable ICH.  Workup with MRI with no underlying lesion, MRA with no anueryem or vascualr malformation. EF of 65-70% with grade 2 diastolic dysfunction and moderate LVH. No shunt. LDL of 68 with HbA1c of 6.3.   Interval Hx   Started on Amlodipine for blood pressure control. SBP goal relaxed to 938 systolic.  Vitals   Vitals:   07/17/20 2052 07/17/20 2358 07/18/20 0606 07/18/20 0801  BP: (!) 147/72 (!) 168/79 (!) 156/68 (!) 159/85  Pulse: 64 61 (!) 58 64  Resp: 16 16 17 18   Temp: 98.1 F (36.7 C) 98 F (36.7 C) 97.7 F (36.5 C) 97.8 F (36.6 C)  TempSrc:      SpO2: 97% 95% 98% 98%  Weight:      Height:         Body mass index is 23.56 kg/m.  Physical Exam   General: Laying comfortably in bed; in no acute distress.  HENT: Normal oropharynx and mucosa. Normal external appearance of ears and nose.  Neck: Supple, no pain or tenderness  CV: No JVD. No peripheral edema.  Pulmonary: Symmetric Chest rise. Normal respiratory effort.  Abdomen: Soft to touch, non-tender.  Ext: No cyanosis, edema, or deformity  Skin: No rash. Normal palpation of skin.   Musculoskeletal: Normal digits and nails by inspection. No clubbing.   Neurologic Examination  Mental status/Cognition: Alert, oriented to self, place, month and year, good attention.  Speech/language: Fluent, comprehension intact, object naming intact, repetition  intact.  Cranial nerves:   CN II Pupils equal and reactive to light, no VF deficits    CN III,IV,VI EOM intact, no gaze preference or deviation, no nystagmus    CN V normal sensation in V1, V2, and V3 segments bilaterally    CN VII no asymmetry, no nasolabial fold flattening    CN VIII normal hearing to speech    CN IX & X normal palatal elevation, no uvular deviation    CN XI 5/5 head turn and 5/5 shoulder shrug bilaterally    CN XII midline tongue protrusion    Motor:  Muscle bulk: normal, tone normal, pronator drift none tremor none Mvmt Root Nerve  Muscle Right Left Comments  SA C5/6 Ax Deltoid 5 5   EF C5/6 Mc Biceps 5 5   EE C6/7/8 Rad Triceps 5 5   WF C6/7 Med FCR 5 5   WE C7/8 PIN ECU 5 5   F Ab C8/T1 U ADM/FDI 5 5   HF L1/2/3 Fem Illopsoas 5 5   KE L2/3/4 Fem Quad 5 5   DF L4/5 D Peron Tib Ant 5 5   PF S1/2 Tibial Grc/Sol 5 5    Reflexes:  Right Left Comments  Pectoralis      Biceps (C5/6) 2 2   Brachioradialis (C5/6) 2 2    Triceps (C6/7) 2 2    Patellar (L3/4) 2 2    Achilles (  S1)      Hoffman      Plantar     Jaw jerk    Sensation:  Light touch Intact throughout   Pin prick    Temperature    Vibration   Proprioception    Coordination/Complex Motor:  - Finger to Nose with mild ataxia in LUE - Heel to shin intact BL - Rapid alternating movement are normal - Gait: Deferred.  Labs   Basic Metabolic Panel:  Lab Results  Component Value Date   NA 139 07/15/2020   K 3.5 07/15/2020   CO2 27 07/15/2020   GLUCOSE 119 (H) 07/15/2020   BUN 16 07/15/2020   CREATININE 0.68 07/15/2020   CALCIUM 8.9 07/15/2020   GFRNONAA >60 07/15/2020   GFRAA 95 04/28/2020   HbA1c:  Lab Results  Component Value Date   HGBA1C 6.3 (H) 07/17/2020   LDL:  Lab Results  Component Value Date   LDLCALC 68 07/17/2020   Urine Drug Screen: No results found for: LABOPIA, COCAINSCRNUR, LABBENZ, AMPHETMU, THCU, LABBARB  Alcohol Level No results found for: ETH No results  found for: PHENYTOIN, ZONISAMIDE, LAMOTRIGINE, LEVETIRACETA No results found for: PHENYTOIN, PHENOBARB, VALPROATE, CBMZ  Imaging and Diagnostic studies   MRI with and without contrast with no underlying lesion, MRA with no anueryem or vascualr malformation. EF of 65-70% with grade 2 diastolic dysfunction and moderate LVH. No shunt. LDL of 68 with HbA1c of 6.3.  Impression   Sandra [name] is a 75 y.o. female hx of Afibb on eliquis, presenting with a  small L cerebellum 1cm ICH s/p Kcentra and Nicardipine and repeat CTH with stable ICH. Workup with MRI with no underlying lesion, MRA with no anueryem or vascualr malformation. EF of 65-70% with grade 2 diastolic dysfunction and moderate LVH. No shunt. LDL of 68 with HbA1c of 6.3.  Recommendations  - I ordered repeat CTH without contrast for 07/26/20. - Follow up with Stroke clinic with [facility] with Dr. Antony Contras in 2 weeks. - Will defer decision regarding resumption of Eliquis to Dr. Leonie Man. - Started on PO Vit B12 1021mcg for low normal Vit B12 levels. - No further inpatient neurological workup. We will signoff. Please feel free to contact us with any questions or concerns. ______________________________________________________________________   Thank you for the opportunity to take part in the care of this patient. If you have any further questions, please contact the neurology consultation attending.  Signed,  [place] Pager Number [phone]

## 2020-07-18 NOTE — Discharge Summary (Signed)
Physician Discharge Summary  Sandra [name] XAJ:[id number] DOB: 08/31/1945 DOA: 07/15/2020  PCP: Jerrol Banana., MD  Admit date: 07/15/2020 Discharge date: 07/18/2020  Admitted From: Home  Disposition:  Home    Recommendations for Outpatient Follow-up:  1. Follow up with PCP Dr. Rosanna Randy in 1 week 2. Dr. Rosanna Randy: Please start vitamin B12 supplementation given low B12 in hospital 3. Follow up with Neurology, Dr. Leonie Man in 2 weeks 4. Dr. Rosanna Randy: Please follow up mild anemia if not previously known      Home Health: None  Equipment/Devices: None new  Discharge Condition: Good  CODE STATUS: FULL Diet recommendation: Cardiac  Brief/Interim Summary: Sandra [name] is a 75 y.o. F with dCHF, pAF on Eliquis, BrCA remote s/p mastectomy, HTN no longer on BP meds, and hypothyroidism who presented with 1 week headache and dizziness.  Evidently called 9-1-1 on 2/10 because she woke in the middle of the night with tachycardia/palpitations, severe headache and "feeling off".  EMS recommended ER evaluation but she declined.  Her symptoms persisted however, and so eventually she told her daughter and her Cardiologist, both of whom recommended she go to the ER, which she eventually agreed to do on 2/17.  There, CT head showed a subcentimeter LEFT cerebellar intracerebral hemorrhage without mass or mass effect.        PRINCIPAL HOSPITAL DIAGNOSIS: Acute LEFT cerebellar hemorrhagic stroke   2/17: CT head shows bleed --> given Kcenra to reverse Eliquis, started on Nicardipine 2/18: Hospitalist and Neurology consulted, MRI/MRA showed no suspicious mass, and no nearby aneurysm     Discharge Diagnoses:   Intraparenchymal LEFT cerebellar hemorrage CT head on admission showed L cerebellar hemorrhage.  Given KCentra and started on nicardipine drip.  BP easy to control, Cardene tapered off.  Neuro status stable.  Repeat CTH 24 hours after admission without change.  MRI/MRA brain  showed no aneurysm or underlying mass.    -Echocardiogram showed no cardiogenic source of embolism -Carotid imaging showed no stenosis -Lipids ordered: discharged on atorvastatin -Aspirin deferred given bleed --> referred for consideration of enrollment in ASPIRE study -Atrial fibrillation: known -tPA contraindicated -Dysphagia screen ordered in ER -PT eval ordered: recommended outpatient PT, referral made -Smoking cessation: not pertinent       Hypertension Had been tapered off antihypertensives last year, and review of notes in the interim shows excellent BP control on carvedilol alone  Carvedilol continued.  Added amlodipine and BP 140/70 at discharge.  Follow up with PCP.     Paroxysmal atrial fibrillation HR controlled on carvedilol.  Hold Eliquis and follow up with Neurology for consideration of ASPIRE study, or if not interested in study participation, discussion of restarting anticoagulation   Pre-diabetes A1c 6.2%.  Anxiety Continue Xanax and sertraline  Hypothyroidism Continue levothyroxine  History breast cancer Continue Femara    Normocytic anemia Asymptomatic              Discharge Instructions  Discharge Instructions    Ambulatory Referral to Neuro Rehab   Complete by: As directed    Ambulatory referral to Neurology   Complete by: As directed    An appointment is requested in approximately: 2 weeks For the Aspire summary   Diet - low sodium heart healthy   Complete by: As directed    Discharge instructions   Complete by: As directed    From Dr. Loleta Books: You were admitted for a small stroke The stroke was caused by an area of bleeding in the cerebellum The cause  of this bleeding is hard to say with certainty.  Please follow up with Dr. Leonie Man at [facility] in [place] He can discuss with you enrollment in the ASPIRE study, and treat you whether you particiapte in the study or not.  FOR NOW: DO NOT  TAKE apixaban  START THE NEW MEDICINE amlodipine/Norvasc 5 mg daily  Resume your other medicines without change   Go see your primary doctor in 1 week, they may adjust your blood pressure medicines as needed   Increase activity slowly   Complete by: As directed      Allergies as of 07/18/2020   No Known Allergies     Medication List    STOP taking these medications   apixaban 5 MG Tabs tablet Commonly known as: Eliquis     TAKE these medications   acetaminophen 500 MG tablet Commonly known as: TYLENOL Take 500-1,000 mg by mouth every 6 (six) hours as needed for mild pain or moderate pain.   ALPRAZolam 0.5 MG tablet Commonly known as: XANAX TAKE 1 TABLET BY MOUTH AT  BEDTIME   amLODipine 5 MG tablet Commonly known as: NORVASC Take 1 tablet (5 mg total) by mouth daily. Start taking on: July 19, 2020   atorvastatin 40 MG tablet Commonly known as: LIPITOR Take 40 mg by mouth daily.   Calcium Carb-Cholecalciferol 500-400 MG-UNIT Tabs Take 1-2 tablets by mouth daily.   carvedilol 6.25 MG tablet Commonly known as: COREG Take 1 tablet (6.25 mg total) by mouth 2 (two) times daily with a meal.   hydroxypropyl methylcellulose / hypromellose 2.5 % ophthalmic solution Commonly known as: ISOPTO TEARS / GONIOVISC Place 1 drop into both eyes 4 (four) times daily as needed for dry eyes.   latanoprost 0.005 % ophthalmic solution Commonly known as: XALATAN Place 1 drop into both eyes at bedtime.   letrozole 2.5 MG tablet Commonly known as: FEMARA Take 1 tablet (2.5 mg total) by mouth daily.   levothyroxine 100 MCG tablet Commonly known as: SYNTHROID TAKE 1 TABLET BY MOUTH 30  MINUTES BEFORE BREAKFAST What changed: See the new instructions.   multivitamin with minerals Tabs tablet Take 1 tablet by mouth daily at 3 pm.   omeprazole 40 MG capsule Commonly known as: PRILOSEC TAKE 1 CAPSULE BY MOUTH  DAILY   sennosides-docusate sodium 8.6-50 MG tablet Commonly  known as: SENOKOT-S Take 1 tablet by mouth as needed for constipation.   sertraline 100 MG tablet Commonly known as: ZOLOFT TAKE 1 TABLET BY MOUTH  DAILY   timolol 0.5 % ophthalmic solution Commonly known as: TIMOPTIC Place 1 drop into both eyes every morning.   Vitamin D3 125 MCG (5000 UT) Tabs Take 125 Units by mouth daily.       Follow-up Information    Jerrol Banana., MD. Schedule an appointment as soon as possible for a visit in 1 week(s).   Specialty: Family Medicine Contact information: [address] [place] [address] [phone]        Garvin Fila, MD. Schedule an appointment as soon as possible for a visit in 2 week(s).   Specialties: Neurology, Radiology Why: Call if you have not heard from [facility] by Thursday Contact information: [address] [place] [address] [place]. Call in 1 day(s).   Specialty: Rehabilitation Why: Call to schedule outpatient therapy follow up Contact information: [place] [id number] [place]  [place] [address] [place]            . Call in 1 day(s).   Why: [address], [place], [place] [address]  Phone: [phone]  Call Monday to schedule your repeat CT head within 1 week; ordered by Dr. Lorrin Goodell              No Known Allergies  Consultations:  Neurology   Procedures/Studies: CT HEAD WO CONTRAST  Result Date: 07/16/2020 CLINICAL DATA:  Follow-up CT EXAM: CT HEAD WITHOUT CONTRAST TECHNIQUE: Contiguous axial images were obtained from the base of the skull through the vertex without intravenous contrast. COMPARISON:  Yesterday FINDINGS: Brain: 1 cm hemorrhage with mild adjacent edema in the deep left cerebellum. Often, hemorrhage in this location is hypertensive, but no excessive small-vessel ischemic  change for age. No cortical infarct, hydrocephalus, or collection. Vascular: No hyperdense vessel or unexpected calcification. Skull: Normal. Negative for fracture or focal lesion. Sinuses/Orbits: No acute finding. IMPRESSION: Unchanged 1 cm hemorrhage in the deep left cerebellum. Electronically Signed   By: Monte Fantasia M.D.   On: 07/16/2020 07:58   CT Head Wo Contrast  Result Date: 07/15/2020 CLINICAL DATA:  Dizziness EXAM: CT HEAD WITHOUT CONTRAST TECHNIQUE: Contiguous axial images were obtained from the base of the skull through the vertex without intravenous contrast. COMPARISON:  None. FINDINGS: Brain: There is mild diffuse atrophy. There is a focal area of hemorrhage arising in the left cerebellar dentate nucleus measuring 0.9 x 0.9 cm. There is mild surrounding edema in this area. There is no other appreciable hemorrhage. There is no associated mass. There is no extra-axial fluid collection or midline shift. There is patchy small vessel disease in the centra semiovale bilaterally. Vascular: No hyperdense vessel. There is calcification in each carotid siphon region. Skull: The bony calvarium appears intact. Sinuses/Orbits: There is mucosal thickening in several ethmoid air cells. Other visualized paranasal sinuses are clear. Visualized orbits appear symmetric bilaterally. Other: Mastoid air cells are clear. IMPRESSION: Focal area of hemorrhage with adjacent edema in the left cerebellar dentate nucleus. Suspect acute hemorrhagic infarct in this area. Elsewhere there is mild atrophy with patchy periventricular small vessel disease. There are foci of arterial vascular calcification. There is mucosal thickening in several ethmoid air cells. Critical Value/emergent results were called by telephone at the time of interpretation on 07/15/2020 at 3:16 pm to provider PHILLIP STAFFORD , who verbally acknowledged these results. Electronically Signed   By: Lowella Grip III M.D.   On: 07/15/2020 15:16   MR  ANGIO HEAD WO CONTRAST  Result Date: 07/16/2020 CLINICAL DATA:  Headache, intracranial hemorrhage suspected; neuro deficit, acute, stroke suspected; brain mass or lesion. Additional history obtained from [place] presenting with headache and dizziness. History of paroxysmal atrial fibrillation on anticoagulation therapy, history of breast cancer status post mastectomy, history of heart failure with preserved ejection fraction, hypertension EXAM: MRI HEAD WITHOUT AND WITH CONTRAST MRA HEAD WITHOUT CONTRAST TECHNIQUE: Multiplanar, multiecho pulse sequences of the brain and surrounding structures were obtained without and with intravenous contrast. Angiographic images of the head were obtained using MRA technique without contrast. CONTRAST:  17m GADAVIST GADOBUTROL 1 MMOL/ML IV SOLN COMPARISON:  Prior head CT examinations 07/16/2020 and earlier. Brain MRI 06/16/2004. FINDINGS: MRI HEAD FINDINGS Brain: Mild cerebral and cerebellar atrophy. Unchanged 10 mm acute/early subacute parenchymal hemorrhage within the left cerebellar hemisphere with mild surrounding edema. No suspicious mass-like or  nodular enhancement at this site. Mild multifocal T2/FLAIR hyperintensity within the cerebral white matter and pons is nonspecific, but compatible with chronic small vessel ischemic disease. Chronic lacunar infarcts within the right centrum semiovale (series 15, image 37) and right cerebellar hemisphere (series 10, image 7) are new from the prior MRI of 06/16/2004 No evidence of acute ischemic infarct. No extra-axial fluid collection. No midline shift. No abnormal intracranial enhancement. Vascular: Expected proximal arterial flow voids. Skull and upper cervical spine: No focal marrow lesion. Sinuses/Orbits: Visualized orbits show no acute finding. Bilateral maxillary sinus mucous retention cysts. Minimal ethmoid sinus mucosal thickening. MRA HEAD FINDINGS The intracranial internal carotid arteries are  patent. The M1 middle cerebral arteries are patent. No M2 proximal branch occlusion or high-grade proximal stenosis is identified. The anterior cerebral arteries are patent. Strongly dominant A1 left anterior cerebral artery with possible azygos configuration of the A2 and A3 anterior cerebral arteries. 1-2 mm inferiorly projecting vascular protrusion arising from the paraclinoid left ICA, which may reflect an infundibulum or aneurysm (series 1048, image 191) (series 1, image 101). The intracranial vertebral arteries are patent. The basilar artery is patent. The posterior cerebral arteries are patent. No evidence of aneurysm or vascular malformation in the region of the left cerebellar parenchymal hemorrhage. IMPRESSION: MRI brain: 1. Unchanged 10 mm acute/early subacute left cerebellar parenchymal hemorrhage with mild surrounding edema. No suspicious mass-like or nodular enhancement at this site. 2. Mild generalized atrophy of the brain and chronic small vessel ischemic disease, progressed as compared to the brain MRI of 06/16/2004. Chronic lacunar infarcts within the right frontal white matter and right cerebellum are new from this prior exam. 3. Bilateral maxillary sinus mucous retention cysts. MRA head 1. No aneurysm or vascular malformation is identified in the region of the left cerebellar parenchymal hemorrhage. 2. No intracranial large vessel occlusion or proximal high-grade arterial stenosis. 3. 1-2 mm inferiorly projecting vascular protrusion arising from the paraclinoid left ICA, which may reflect an aneurysm or infundibulum. Electronically Signed   By: Kellie Simmering DO   On: 07/16/2020 15:43   MR ANGIO NECK W WO CONTRAST  Result Date: 07/16/2020 CLINICAL DATA:  Neuro deficit, acute, stroke suspected; carotid stenosis screening, risk factors. EXAM: MRA NECK WITHOUT AND WITH CONTRAST TECHNIQUE: Multiplanar and multiecho pulse sequences of the neck were obtained without and with intravenous contrast.  Angiographic images of the neck were obtained using MRA technique without and with intravenous contrast. CONTRAST:  36m GADAVIST GADOBUTROL 1 MMOL/ML IV SOLN COMPARISON:  No pertinent prior exams available for comparison. FINDINGS: Standard aortic branching. The visualized aortic arch is unremarkable. The bilateral common and internal carotid arteries are patent within the neck without hemodynamically significant stenosis (50% or greater). The vertebral arteries are patent within the neck with antegrade flow bilaterally. The right vertebral artery is dominant. Apparent at least moderate stenosis at the origin of the left vertebral artery. IMPRESSION: The common and internal carotid arteries are patent within the neck without hemodynamically significant stenosis. The vertebral arteries are patent within the neck with antegrade flow. Stenosis at the origin of the non-dominant left vertebral artery, which appears at least moderate in severity. Electronically Signed   By: KKellie SimmeringDO   On: 07/16/2020 15:53   MR BRAIN W WO CONTRAST  Result Date: 07/16/2020 CLINICAL DATA:  Headache, intracranial hemorrhage suspected; neuro deficit, acute, stroke suspected; brain mass or lesion. [place]. History of paroxysmal atrial fibrillation  on anticoagulation therapy, history of breast cancer status post mastectomy, history of heart failure with preserved ejection fraction, hypertension EXAM: MRI HEAD WITHOUT AND WITH CONTRAST MRA HEAD WITHOUT CONTRAST TECHNIQUE: Multiplanar, multiecho pulse sequences of the brain and surrounding structures were obtained without and with intravenous contrast. Angiographic images of the head were obtained using MRA technique without contrast. CONTRAST:  29m GADAVIST GADOBUTROL 1 MMOL/ML IV SOLN COMPARISON:  Prior head CT examinations 07/16/2020 and earlier. Brain MRI 06/16/2004. FINDINGS: MRI HEAD  FINDINGS Brain: Mild cerebral and cerebellar atrophy. Unchanged 10 mm acute/early subacute parenchymal hemorrhage within the left cerebellar hemisphere with mild surrounding edema. No suspicious mass-like or nodular enhancement at this site. Mild multifocal T2/FLAIR hyperintensity within the cerebral white matter and pons is nonspecific, but compatible with chronic small vessel ischemic disease. Chronic lacunar infarcts within the right centrum semiovale (series 15, image 37) and right cerebellar hemisphere (series 10, image 7) are new from the prior MRI of 06/16/2004 No evidence of acute ischemic infarct. No extra-axial fluid collection. No midline shift. No abnormal intracranial enhancement. Vascular: Expected proximal arterial flow voids. Skull and upper cervical spine: No focal marrow lesion. Sinuses/Orbits: Visualized orbits show no acute finding. Bilateral maxillary sinus mucous retention cysts. Minimal ethmoid sinus mucosal thickening. MRA HEAD FINDINGS The intracranial internal carotid arteries are patent. The M1 middle cerebral arteries are patent. No M2 proximal branch occlusion or high-grade proximal stenosis is identified. The anterior cerebral arteries are patent. Strongly dominant A1 left anterior cerebral artery with possible azygos configuration of the A2 and A3 anterior cerebral arteries. 1-2 mm inferiorly projecting vascular protrusion arising from the paraclinoid left ICA, which may reflect an infundibulum or aneurysm (series 1048, image 191) (series 1, image 101). The intracranial vertebral arteries are patent. The basilar artery is patent. The posterior cerebral arteries are patent. No evidence of aneurysm or vascular malformation in the region of the left cerebellar parenchymal hemorrhage. IMPRESSION: MRI brain: 1. Unchanged 10 mm acute/early subacute left cerebellar parenchymal hemorrhage with mild surrounding edema. No suspicious mass-like or nodular enhancement at this site. 2. Mild  generalized atrophy of the brain and chronic small vessel ischemic disease, progressed as compared to the brain MRI of 06/16/2004. Chronic lacunar infarcts within the right frontal white matter and right cerebellum are new from this prior exam. 3. Bilateral maxillary sinus mucous retention cysts. MRA head 1. No aneurysm or vascular malformation is identified in the region of the left cerebellar parenchymal hemorrhage. 2. No intracranial large vessel occlusion or proximal high-grade arterial stenosis. 3. 1-2 mm inferiorly projecting vascular protrusion arising from the paraclinoid left ICA, which may reflect an aneurysm or infundibulum. Electronically Signed   By: KKellie SimmeringDO   On: 07/16/2020 15:43   ECHOCARDIOGRAM COMPLETE  Result Date: 07/17/2020    ECHOCARDIOGRAM REPORT   Patient Name:   [facility]2020 Medical Rec #:  [id number]     Height:       63.0 in Accession #:    [id number]    Weight:       133.0 lb Date of Birth:  [birth year]    BSA:          1.626 m Patient Age:    89years       BP:           121/60 mmHg Patient Gender: F              HR:  66 bpm. Exam Location:  [facility] Procedure: 2D Echo, Cardiac Doppler, Color Doppler and Strain Analysis Indications:     Stroke 434.91 / I163.9  History:         Patient has prior history of Echocardiogram examinations, most                  recent 09/12/2019. Signs/Symptoms:Murmur; Risk                  Factors:Hypertension. PAF.  Sonographer:     Sherrie Sport RDCS (AE) Referring Phys:  [id number] [facility] Diagnosing Phys: Serafina Royals MD  Sonographer Comments: Global longitudinal strain was attempted. IMPRESSIONS  1. Left ventricular ejection fraction, by estimation, is 65 to 70%. The left ventricle has normal function. The left ventricle has no regional wall motion abnormalities. There is moderate left ventricular hypertrophy. Left ventricular diastolic parameters are consistent with Grade II diastolic dysfunction  (pseudonormalization).  2. Right ventricular systolic function is normal. The right ventricular size is normal.  3. Left atrial size was mildly dilated.  4. Right atrial size was mildly dilated.  5. The mitral valve is normal in structure. Mild mitral valve regurgitation.  6. The aortic valve is normal in structure. Aortic valve regurgitation is trivial. FINDINGS  Left Ventricle: Left ventricular ejection fraction, by estimation, is 65 to 70%. The left ventricle has normal function. The left ventricle has no regional wall motion abnormalities. The left ventricular internal cavity size was small. There is moderate  left ventricular hypertrophy. Left ventricular diastolic parameters are consistent with Grade II diastolic dysfunction (pseudonormalization). Right Ventricle: The right ventricular size is normal. No increase in right ventricular wall thickness. Right ventricular systolic function is normal. Left Atrium: Left atrial size was mildly dilated. Right Atrium: Right atrial size was mildly dilated. Pericardium: There is no evidence of pericardial effusion. Mitral Valve: The mitral valve is normal in structure. Mild mitral valve regurgitation. Tricuspid Valve: The tricuspid valve is normal in structure. Tricuspid valve regurgitation is mild. Aortic Valve: The aortic valve is normal in structure. Aortic valve regurgitation is trivial. Aortic valve mean gradient measures 6.0 mmHg. Aortic valve peak gradient measures 11.6 mmHg. Aortic valve area, by VTI measures 2.02 cm. Pulmonic Valve: The pulmonic valve was normal in structure. Pulmonic valve regurgitation is not visualized. Aorta: The aortic root and ascending aorta are structurally normal, with no evidence of dilitation. IAS/Shunts: No atrial level shunt detected by color flow Doppler.  LEFT VENTRICLE PLAX 2D LVIDd:         3.56 cm  Diastology LV PW:         1.49 cm  LV e' medial:    7.51 cm/s LV IVS:        1.06 cm  LV E/e' medial:  11.5 LVOT diam:     2.00 cm   LV e' lateral:   6.42 cm/s LV SV:         69       LV E/e' lateral: 13.5 LV SV Index:   43 LVOT Area:     3.14 cm  RIGHT VENTRICLE RV Basal diam:  4.11 cm RV S prime:     13.80 cm/s TAPSE (M-mode): 3.4 cm LEFT ATRIUM           Index       RIGHT ATRIUM           Index LA diam:      4.10 cm 2.52 cm/m  RA Area:  12.00 cm LA Vol (A2C): 21.2 ml 13.04 ml/m RA Volume:   22.10 ml  13.59 ml/m LA Vol (A4C): 72.8 ml 44.78 ml/m  AORTIC VALVE                    PULMONIC VALVE AV Area (Vmax):    2.29 cm     PV Vmax:        0.99 m/s AV Area (Vmean):   2.13 cm     PV Peak grad:   3.9 mmHg AV Area (VTI):     2.02 cm     RVOT Peak grad: 4 mmHg AV Vmax:           170.00 cm/s AV Vmean:          115.000 cm/s AV VTI:            0.343 m AV Peak Grad:      11.6 mmHg AV Mean Grad:      6.0 mmHg LVOT Vmax:         124.00 cm/s LVOT Vmean:        78.100 cm/s LVOT VTI:          0.220 m LVOT/AV VTI ratio: 0.64  AORTA Ao Root diam: 3.13 cm MITRAL VALVE               TRICUSPID VALVE MV Area (PHT): 4.49 cm    TR Peak grad:   22.8 mmHg MV Decel Time: 169 msec    TR Vmax:        239.00 cm/s MV E velocity: 86.50 cm/s MV A velocity: 57.80 cm/s  SHUNTS MV E/A ratio:  1.50        Systemic VTI:  0.22 m                            Systemic Diam: 2.00 cm Serafina Royals MD Electronically signed by Serafina Royals MD Signature Date/Time: 07/17/2020/7:47:38 AM    Final       Subjective: No new fever, confusion.  No new focal weakness, numbness, speech disturbance.  Discharge Exam: Vitals:   07/18/20 0801 07/18/20 1208  BP: (!) 159/85 140/73  Pulse: 64 (!) 54  Resp: 18 16  Temp: 97.8 F (36.6 C) 98.2 F (36.8 C)  SpO2: 98% 97%   Vitals:   07/17/20 2358 07/18/20 0606 07/18/20 0801 07/18/20 1208  BP: (!) 168/79 (!) 156/68 (!) 159/85 140/73  Pulse: 61 (!) 58 64 (!) 54  Resp: 16 17 18 16   Temp: 98 F (36.7 C) 97.7 F (36.5 C) 97.8 F (36.6 C) 98.2 F (36.8 C)  TempSrc:      SpO2: 95% 98% 98% 97%  Weight:      Height:         General: Pt is alert, awake, not in acute distress Cardiovascular: RRR, nl S1-S2, no murmurs appreciated.   No LE edema.   Respiratory: Normal respiratory rate and rhythm.  CTAB without rales or wheezes. Abdominal: Abdomen soft and non-tender.  No distension or HSM.   Neuro/Psych: Strength symmetric in upper and lower extremities. Mild ataxia in left hand. Judgment and insight appear normal.   The results of significant diagnostics from this hospitalization (including imaging, microbiology, ancillary and laboratory) are listed below for reference.     Microbiology: Recent Results (from the past 240 hour(s))  Resp Panel by RT-PCR (Flu A&B, Covid) Nasopharyngeal Swab     Status: None  Collection Time: 07/15/20  5:45 PM   Specimen: Nasopharyngeal Swab; Nasopharyngeal(NP) swabs in vial transport medium  Result Value Ref Range Status   SARS Coronavirus 2 by RT PCR NEGATIVE NEGATIVE Final    Comment: (NOTE) SARS-CoV-2 target nucleic acids are NOT DETECTED.  The SARS-CoV-2 RNA is generally detectable in upper respiratory specimens during the acute phase of infection. The lowest concentration of SARS-CoV-2 viral copies this assay can detect is 138 copies/mL. A negative result does not preclude SARS-Cov-2 infection and should not be used as the sole basis for treatment or other patient management decisions. A negative result may occur with  improper specimen collection/handling, submission of specimen other than nasopharyngeal swab, presence of viral mutation(s) within the areas targeted by this assay, and inadequate number of viral copies(<138 copies/mL). A negative result must be combined with clinical observations, patient history, and epidemiological information. The expected result is Negative.  Fact Sheet for Patients:  EntrepreneurPulse.com.au  Fact Sheet for Healthcare Providers:  IncredibleEmployment.be  This test is no t yet approved  or cleared by the [place] FDA and  has been authorized for detection and/or diagnosis of SARS-CoV-2 by FDA under an Emergency Use Authorization (EUA). This EUA will remain  in effect (meaning this test can be used) for the duration of the COVID-19 declaration under Section 564(b)(1) of the Act, 21 U.S.C.section 360bbb-3(b)(1), unless the authorization is terminated  or revoked sooner.       Influenza A by PCR NEGATIVE NEGATIVE Final   Influenza B by PCR NEGATIVE NEGATIVE Final    Comment: (NOTE) The Xpert Xpress SARS-CoV-2/FLU/RSV plus assay is intended as an aid in the diagnosis of influenza from Nasopharyngeal swab specimens and should not be used as a sole basis for treatment. Nasal washings and aspirates are unacceptable for Xpert Xpress SARS-CoV-2/FLU/RSV testing.  Fact Sheet for Patients: EntrepreneurPulse.com.au  Fact Sheet for Healthcare Providers: IncredibleEmployment.be  This test is not yet approved or cleared by the [place] FDA and has been authorized for detection and/or diagnosis of SARS-CoV-2 by FDA under an Emergency Use Authorization (EUA). This EUA will remain in effect (meaning this test can be used) for the duration of the COVID-19 declaration under Section 564(b)(1) of the Act, 21 U.S.C. section 360bbb-3(b)(1), unless the authorization is terminated or revoked.  Performed at [facility], [address] [place], [place] [address]      Labs: BNP (last 3 results) No results for input(s): BNP in the last 8760 hours. Basic Metabolic Panel: Recent Labs  Lab 07/15/20 1307  NA 139  K 3.5  CL 103  CO2 27  GLUCOSE 119*  BUN 16  CREATININE 0.68  CALCIUM 8.9   Liver Function Tests: No results for input(s): AST, ALT, ALKPHOS, BILITOT, PROT, ALBUMIN in the last 168 hours. No results for input(s): LIPASE, AMYLASE in the last 168 hours. No results for input(s): AMMONIA in the last 168  hours. CBC: Recent Labs  Lab 07/15/20 1307 07/16/20 0503  WBC 5.7 5.5  HGB 11.4* 11.0*  HCT 35.6* 34.0*  MCV 89.0 87.4  PLT 210 198   Cardiac Enzymes: No results for input(s): CKTOTAL, CKMB, CKMBINDEX, TROPONINI in the last 168 hours. BNP: Invalid input(s): POCBNP CBG: No results for input(s): GLUCAP in the last 168 hours. D-Dimer No results for input(s): DDIMER in the last 72 hours. Hgb A1c Recent Labs    07/16/20 1252 07/17/20 0436  HGBA1C 6.2* 6.3*   Lipid Profile Recent Labs    07/16/20 1252 07/17/20  0436  CHOL 143 137  HDL 49 50  LDLCALC 75 68  TRIG 95 95  CHOLHDL 2.9 2.7   Thyroid function studies No results for input(s): TSH, T4TOTAL, T3FREE, THYROIDAB in the last 72 hours.  Invalid input(s): FREET3 Anemia work up Recent Labs    07/16/20 1252 07/17/20 0851  VITAMINB12  --  274  FOLATE 32.0  --    Urinalysis    Component Value Date/Time   COLORURINE STRAW (A) 07/15/2020 1549   APPEARANCEUR CLEAR (A) 07/15/2020 1549   LABSPEC 1.003 (L) 07/15/2020 1549   PHURINE 7.0 07/15/2020 1549   GLUCOSEU NEGATIVE 07/15/2020 1549   HGBUR NEGATIVE 07/15/2020 1549   BILIRUBINUR NEGATIVE 07/15/2020 1549   BILIRUBINUR Negative 11/24/2019 1352   KETONESUR NEGATIVE 07/15/2020 1549   PROTEINUR NEGATIVE 07/15/2020 1549   UROBILINOGEN 0.2 11/24/2019 1352   NITRITE NEGATIVE 07/15/2020 1549   LEUKOCYTESUR NEGATIVE 07/15/2020 1549   Sepsis Labs Invalid input(s): PROCALCITONIN,  WBC,  LACTICIDVEN Microbiology Recent Results (from the past 240 hour(s))  Resp Panel by RT-PCR (Flu A&B, Covid) Nasopharyngeal Swab     Status: None   Collection Time: 07/15/20  5:45 PM   Specimen: Nasopharyngeal Swab; Nasopharyngeal(NP) swabs in vial transport medium  Result Value Ref Range Status   SARS Coronavirus 2 by RT PCR NEGATIVE NEGATIVE Final    Comment: (NOTE) SARS-CoV-2 target nucleic acids are NOT DETECTED.  The SARS-CoV-2 RNA is generally detectable in upper  respiratory specimens during the acute phase of infection. The lowest concentration of SARS-CoV-2 viral copies this assay can detect is 138 copies/mL. A negative result does not preclude SARS-Cov-2 infection and should not be used as the sole basis for treatment or other patient management decisions. A negative result may occur with  improper specimen collection/handling, submission of specimen other than nasopharyngeal swab, presence of viral mutation(s) within the areas targeted by this assay, and inadequate number of viral copies(<138 copies/mL). A negative result must be combined with clinical observations, patient history, and epidemiological information. The expected result is Negative.  Fact Sheet for Patients:  EntrepreneurPulse.com.au  Fact Sheet for Healthcare Providers:  IncredibleEmployment.be  This test is no t yet approved or cleared by the [place] FDA and  has been authorized for detection and/or diagnosis of SARS-CoV-2 by FDA under an Emergency Use Authorization (EUA). This EUA will remain  in effect (meaning this test can be used) for the duration of the COVID-19 declaration under Section 564(b)(1) of the Act, 21 U.S.C.section 360bbb-3(b)(1), unless the authorization is terminated  or revoked sooner.       Influenza A by PCR NEGATIVE NEGATIVE Final   Influenza B by PCR NEGATIVE NEGATIVE Final    Comment: (NOTE) The Xpert Xpress SARS-CoV-2/FLU/RSV plus assay is intended as an aid in the diagnosis of influenza from Nasopharyngeal swab specimens and should not be used as a sole basis for treatment. Nasal washings and aspirates are unacceptable for Xpert Xpress SARS-CoV-2/FLU/RSV testing.  Fact Sheet for Patients: EntrepreneurPulse.com.au  Fact Sheet for Healthcare Providers: IncredibleEmployment.be  This test is not yet approved or cleared by the [place] FDA and has been  authorized for detection and/or diagnosis of SARS-CoV-2 by FDA under an Emergency Use Authorization (EUA). This EUA will remain in effect (meaning this test can be used) for the duration of the COVID-19 declaration under Section 564(b)(1) of the Act, 21 U.S.C. section 360bbb-3(b)(1), unless the authorization is terminated or revoked.  Performed at [facility], [place], [place]  [address]      Time coordinating discharge: 25 minutes      SIGNED:   Edwin Dada, MD  [facility] 07/18/2020, 4:35 PM

## 2020-07-19 ENCOUNTER — Telehealth: Payer: Self-pay

## 2020-07-19 ENCOUNTER — Telehealth: Payer: Self-pay | Admitting: Emergency Medicine

## 2020-07-19 NOTE — Telephone Encounter (Signed)
Called and LVM for appointment on 08/02/20 @ 330.  Slot is held at this time.

## 2020-07-19 NOTE — Telephone Encounter (Signed)
No HFU scheduled at this time.

## 2020-07-19 NOTE — Telephone Encounter (Signed)
Transition Care Management Follow-up Telephone Call  Date of discharge and from where: [facility] on 07/18/20  How have you been since you were released from the hospital? Per daughter- pt still has residual balance issues and some memory issues. Pt is using a walker to get around the home due to being unstable. Pt has been resting well but is still weak. Pt experienced some nausea this morning when she was reading. Daughter checked her b/p after this occurred and her b/p was 153/89. Daughter then gave pt her b/p meds. An hour later she checked b/p again and it was 147/68. Pt's pulse is staying around the high 50s. Daughter is concerned about pt taking the increased Carvedilol dose due to her pulse rate. Pt had a headache yesterday but has not mentioned it today. Pt is dizzy when she gets up or when her b/p is elevated but it gets better when she rests. Declines pain, fever or v/d.  Any questions or concerns? Yes, daughter stated that [facility] would be contacting provider to get orders for PT (and possibly OT). The rehab facility was unable to accept the orders from the hospital.   Items Reviewed:  Did the pt receive and understand the discharge instructions provided? Yes   Medications obtained and verified? No, declined verifying medications at this time. Will verify at [facility] apt.   Any new allergies since your discharge? No   Dietary orders reviewed? Yes  Do you have support at home? Yes   Other (ie: DME, Home Health, etc): Outpatient rehab ordered for PT/OT.  Functional Questionnaire: (I = Independent and D = Dependent)  Bathing/Dressing- Will take a shower for the first time today. Daughter will be present for support.   Meal Prep- I  Eating- I  Maintaining continence- I, does have urine leakage with urges/pressure.   Transferring/Ambulation- D, using a walker at all times.  Managing Meds- D, currently has assistance managing all medications.    Follow up appointments  reviewed:    PCP Hospital f/u appt confirmed? Yes  scheduled to see Dr Rosanna Randy on 07/22/20 @ 9:40 PM.  [facility] f/u appt confirmed? Yes    Are transportation arrangements needed? No  If their condition worsens, is the pt aware to call  their PCP or go to the ED? Yes  Was the patient provided with contact information for the PCP's office or ED? Yes  Was the pt encouraged to call back with questions or concerns? Yes

## 2020-07-21 NOTE — Progress Notes (Signed)
I,Sandra [name],acting as a scribe for Sandra Durie, MD.,have documented all relevant documentation on the behalf of Sandra Durie, MD,as directed by  Sandra Durie, MD while in the presence of Sandra Durie, MD..   Established patient visit   Patient: Sandra [name]   DOB: 1946-03-30   75 y.o. Female  MRN: [id number] Visit Date: 07/22/2020  Today's healthcare provider: [name], MD   Chief Complaint  Patient presents with  . Hospitalization Follow-up   Subjective    HPI  Follow up Hospitalization Transition of care visit Dizziness a little bit better but she 10 used to have a little cognitive slowing. Friend -Present on the visit and daughter is present via FaceTime on the visit.  Patient was admitted to [facility] on 07/15/2020 and discharged on 07/18/2020. She was treated for; Acute LEFT cerebellar hemorrhagic stroke.  Eliquis was discontinued. Treatment for this included; see note in chart. Telephone follow up was done on 07/19/2020 She reports good compliance with treatment. She reports this condition is improved.  --------------------------------------------------------------------  Patient states she feels much improved since hospital discharge. Patient has still been having dizziness and slight nausea. Patient states she has continued headaches and off balance.       Medications: Outpatient Medications Prior to Visit  Medication Sig  . acetaminophen (TYLENOL) 500 MG tablet Take 500-1,000 mg by mouth every 6 (six) hours as needed for mild pain or moderate pain.  [name] ALPRAZolam (XANAX) 0.5 MG tablet TAKE 1 TABLET BY MOUTH AT  BEDTIME (Patient taking differently: Take 0.5 mg by mouth at bedtime.)  . atorvastatin (LIPITOR) 40 MG tablet Take 40 mg by mouth daily.  . Calcium Carb-Cholecalciferol 500-400 MG-UNIT TABS Take 1-2 tablets by mouth daily.  . carvedilol (COREG) 6.25 MG tablet Take 1 tablet (6.25 mg total) by mouth 2 (two) times daily with  a meal.  . Cholecalciferol (VITAMIN D3) 125 MCG (5000 UT) TABS Take 125 Units by mouth daily.  . hydroxypropyl methylcellulose / hypromellose (ISOPTO TEARS / GONIOVISC) 2.5 % ophthalmic solution Place 1 drop into both eyes 4 (four) times daily as needed for dry eyes.  [name] latanoprost (XALATAN) 0.005 % ophthalmic solution Place 1 drop into both eyes at bedtime.  [name] letrozole (FEMARA) 2.5 MG tablet Take 1 tablet (2.5 mg total) by mouth daily.  [name] levothyroxine (SYNTHROID) 100 MCG tablet TAKE 1 TABLET BY MOUTH 30  MINUTES BEFORE BREAKFAST (Patient taking differently: Take 100 mcg by mouth daily before breakfast.)  . Multiple Vitamin (MULTIVITAMIN WITH MINERALS) TABS tablet Take 1 tablet by mouth daily at 3 pm.  . omeprazole (PRILOSEC) 40 MG capsule TAKE 1 CAPSULE BY MOUTH  DAILY (Patient taking differently: Take 40 mg by mouth daily.)  . sennosides-docusate sodium (SENOKOT-S) 8.6-50 MG tablet Take 1 tablet by mouth as needed for constipation.  . sertraline (ZOLOFT) 100 MG tablet TAKE 1 TABLET BY MOUTH  DAILY (Patient taking differently: Take 100 mg by mouth daily.)  . timolol (TIMOPTIC) 0.5 % ophthalmic solution Place 1 drop into both eyes every morning.  . [DISCONTINUED] amLODipine (NORVASC) 5 MG tablet Take 1 tablet (5 mg total) by mouth daily.   No facility-administered medications prior to visit.    Review of Systems  Constitutional: Negative for appetite change, chills, fatigue and fever.  Respiratory: Negative for chest tightness and shortness of breath.   Cardiovascular: Negative for chest pain and palpitations.  Gastrointestinal: Negative for abdominal pain, nausea and vomiting.  Neurological: Negative  for dizziness and weakness.        Objective    BP 123/74 (BP Location: Left Arm, Patient Position: Sitting, Cuff Size: Normal)   Pulse (!) 57   Temp 97.8 F (36.6 C) (Oral)   Resp 18   Wt 132 lb (59.9 kg)   SpO2 97%   BMI 23.38 kg/m  BP Readings from Last 3 Encounters:  07/22/20  123/74  07/18/20 140/73  04/28/20 126/76   Wt Readings from Last 3 Encounters:  07/22/20 132 lb (59.9 kg)  07/15/20 133 lb (60.3 kg)  04/28/20 130 lb (59 kg)       Physical Exam Vitals reviewed.  Constitutional:      Appearance: Normal appearance. She is normal weight.  HENT:     Head: Normocephalic.     Right Ear: External ear normal.     Left Ear: External ear normal.     Nose: Nose normal.     Mouth/Throat:     Pharynx: Oropharynx is clear.  Eyes:     General: No scleral icterus.    Conjunctiva/sclera: Conjunctivae normal.  Cardiovascular:     Rate and Rhythm: Normal rate and regular rhythm.     Pulses: Normal pulses.     Heart sounds: Normal heart sounds.  Pulmonary:     Breath sounds: Normal breath sounds.  Abdominal:     Palpations: Abdomen is soft.  Skin:    General: Skin is warm and dry.  Neurological:     General: No focal deficit present.     Mental Status: She is alert and oriented to person, place, and time. Mental status is at baseline.  Psychiatric:        Mood and Affect: Mood normal.        Behavior: Behavior normal.        Thought Content: Thought content normal.        Judgment: Judgment normal.       No results found for any visits on 07/22/20.  Assessment & Plan     1. Hemorrhagic stroke [facility]) Has appointment with allergy for follow-up and then with cardiology guarding A. fib and anticoagulation. Eliquis has been held. - Ambulatory referral to Home Health  2. Essential hypertension Amlodipine 5 to 10 mg daily home readings are little higher. - CBC w/Diff/Platelet - Comprehensive Metabolic Panel (CMET) - TSH - amLODipine (NORVASC) 10 MG tablet; Take 1 tablet (10 mg total) by mouth daily.  Dispense: 30 tablet; Refill: 3  3. Other hyperlipidemia  - CBC w/Diff/Platelet - Comprehensive Metabolic Panel (CMET) - TSH  4. Other specified hypothyroidism  - CBC w/Diff/Platelet - Comprehensive Metabolic Panel (CMET) - TSH  5. COVID 1  month ago. 6.  A. Fib Per cardiology.   Return in about 1 month (around 08/19/2020).      I, Sandra Durie, MD, have reviewed all documentation for this visit. The documentation on 07/25/20 for the exam, diagnosis, procedures, and orders are all accurate and complete.     Cranford Mon, MD  [facility] [phone] (phone) [phone] (fax)  [place]

## 2020-07-22 ENCOUNTER — Ambulatory Visit (INDEPENDENT_AMBULATORY_CARE_PROVIDER_SITE_OTHER): Payer: Medicare Other | Admitting: Family Medicine

## 2020-07-22 ENCOUNTER — Encounter: Payer: Self-pay | Admitting: Family Medicine

## 2020-07-22 ENCOUNTER — Other Ambulatory Visit: Payer: Self-pay

## 2020-07-22 VITALS — BP 123/74 | HR 57 | Temp 97.8°F | Resp 18 | Wt 132.0 lb

## 2020-07-22 DIAGNOSIS — E7849 Other hyperlipidemia: Secondary | ICD-10-CM

## 2020-07-22 DIAGNOSIS — E038 Other specified hypothyroidism: Secondary | ICD-10-CM

## 2020-07-22 DIAGNOSIS — I10 Essential (primary) hypertension: Secondary | ICD-10-CM

## 2020-07-22 DIAGNOSIS — U071 COVID-19: Secondary | ICD-10-CM

## 2020-07-22 DIAGNOSIS — I619 Nontraumatic intracerebral hemorrhage, unspecified: Secondary | ICD-10-CM

## 2020-07-22 MED ORDER — AMLODIPINE BESYLATE 10 MG PO TABS: 10.0000 mg | ORAL_TABLET | Freq: Every day | ORAL | 3 refills | Status: DC

## 2020-07-22 NOTE — Patient Instructions (Signed)
Increase amlodipine 5 mg to 10 mg daily. Take aspirin 81 mg daily. Follow up in one month.

## 2020-07-23 ENCOUNTER — Telehealth: Payer: Self-pay

## 2020-07-23 LAB — CBC WITH DIFFERENTIAL/PLATELET
Basophils Absolute: 0 10*3/uL (ref 0.0–0.2)
Basos: 0 %
EOS (ABSOLUTE): 0 10*3/uL (ref 0.0–0.4)
Eos: 1 %
Hematocrit: 38.5 % (ref 34.0–46.6)
Hemoglobin: 12.2 g/dL (ref 11.1–15.9)
Immature Grans (Abs): 0 10*3/uL (ref 0.0–0.1)
Immature Granulocytes: 0 %
Lymphocytes Absolute: 1.5 10*3/uL (ref 0.7–3.1)
Lymphs: 28 %
MCH: 27.5 pg (ref 26.6–33.0)
MCHC: 31.7 g/dL (ref 31.5–35.7)
MCV: 87 fL (ref 79–97)
Monocytes Absolute: 0.6 10*3/uL (ref 0.1–0.9)
Monocytes: 11 %
Neutrophils Absolute: 3.2 10*3/uL (ref 1.4–7.0)
Neutrophils: 60 %
Platelets: 254 10*3/uL (ref 150–450)
RBC: 4.44 x10E6/uL (ref 3.77–5.28)
RDW: 13.5 % (ref 11.7–15.4)
WBC: 5.3 10*3/uL (ref 3.4–10.8)

## 2020-07-23 LAB — COMPREHENSIVE METABOLIC PANEL
ALT: 25 IU/L (ref 0–32)
AST: 23 IU/L (ref 0–40)
Albumin/Globulin Ratio: 1.8 (ref 1.2–2.2)
Albumin: 4.4 g/dL (ref 3.7–4.7)
Alkaline Phosphatase: 84 IU/L (ref 44–121)
BUN/Creatinine Ratio: 21 (ref 12–28)
BUN: 14 mg/dL (ref 8–27)
Bilirubin Total: 0.6 mg/dL (ref 0.0–1.2)
CO2: 25 mmol/L (ref 20–29)
Calcium: 9.2 mg/dL (ref 8.7–10.3)
Chloride: 102 mmol/L (ref 96–106)
Creatinine, Ser: 0.68 mg/dL (ref 0.57–1.00)
GFR calc Af Amer: 99 mL/min/{1.73_m2} (ref >59)
GFR calc non Af Amer: 86 mL/min/{1.73_m2} (ref >59)
Globulin, Total: 2.4 g/dL (ref 1.5–4.5)
Glucose: 100 mg/dL — ABNORMAL HIGH (ref 65–99)
Potassium: 4.1 mmol/L (ref 3.5–5.2)
Sodium: 142 mmol/L (ref 134–144)
Total Protein: 6.8 g/dL (ref 6.0–8.5)

## 2020-07-23 LAB — TSH: TSH: 5.57 u[IU]/mL — ABNORMAL HIGH (ref 0.450–4.500)

## 2020-07-23 NOTE — Telephone Encounter (Signed)
Pt given lab results per notes of Dr. Rosanna Randy on 07/23/20. Pt verbalized understanding. She would like Dr. Rosanna Randy to know she has started on the low dose aspirin.   Richard Maceo Pro., MD  07/23/2020 8:12 AM EST      Anemia back to normal. We will follow thyroid in the future with blood draws. It is fine where it is for now.

## 2020-07-26 NOTE — Telephone Encounter (Signed)
Pt. states she want to keep the 08/04/20 appt. but thanks for the offer.

## 2020-07-27 ENCOUNTER — Ambulatory Visit
Admission: RE | Admit: 2020-07-27 | Discharge: 2020-07-27 | Disposition: A | Discharge status: Home / Self Care | Payer: Medicare Other | Source: Ambulatory Visit | Attending: Neurology | Admitting: Neurology

## 2020-07-27 ENCOUNTER — Other Ambulatory Visit: Payer: Self-pay

## 2020-07-27 DIAGNOSIS — G9389 Other specified disorders of brain: Secondary | ICD-10-CM | POA: Diagnosis not present

## 2020-07-27 DIAGNOSIS — I614 Nontraumatic intracerebral hemorrhage in cerebellum: Secondary | ICD-10-CM | POA: Diagnosis not present

## 2020-07-27 DIAGNOSIS — R911 Solitary pulmonary nodule: Secondary | ICD-10-CM | POA: Diagnosis not present

## 2020-07-27 DIAGNOSIS — I2510 Atherosclerotic heart disease of native coronary artery without angina pectoris: Secondary | ICD-10-CM | POA: Diagnosis not present

## 2020-07-27 DIAGNOSIS — R918 Other nonspecific abnormal finding of lung field: Secondary | ICD-10-CM | POA: Diagnosis not present

## 2020-07-27 DIAGNOSIS — I700 Atherosclerosis of aorta: Secondary | ICD-10-CM | POA: Diagnosis not present

## 2020-07-28 ENCOUNTER — Ambulatory Visit: Payer: Medicare Other | Attending: Family Medicine

## 2020-07-28 VITALS — BP 111/57 | HR 55

## 2020-07-28 DIAGNOSIS — M6281 Muscle weakness (generalized): Secondary | ICD-10-CM | POA: Insufficient documentation

## 2020-07-28 DIAGNOSIS — R2689 Other abnormalities of gait and mobility: Secondary | ICD-10-CM | POA: Insufficient documentation

## 2020-07-28 DIAGNOSIS — R2681 Unsteadiness on feet: Secondary | ICD-10-CM | POA: Insufficient documentation

## 2020-07-28 NOTE — Therapy (Signed)
[place] MAIN [facility] SERVICES [address] [place], [place], [address] Phone: [phone]   Fax:  [phone]  Physical Therapy Evaluation  Patient Details  Name: Sandra [name] MRN: [id number] Date of Birth: 01-11-1946 Referring Provider (PT): Jerrol Banana., MD   Encounter Date: 07/28/2020   PT End of Session - 07/28/20 1456    Visit Number 1    Number of Visits 25    Date for PT Re-Evaluation 10/20/20    Authorization Type eval performed 12/27/2749 (recert for 7/00/1749)    Progress Note Due on Visit 10    PT Start Time 0906    PT Stop Time 1001    PT Time Calculation (min) 55 min    Equipment Utilized During Treatment Gait belt    Activity Tolerance Patient tolerated treatment well    Behavior During Therapy [facility] for tasks assessed/performed           Past Medical History:  Diagnosis Date  . (HFpEF) heart failure with preserved ejection fraction ([place])    a. 08/2019 Echo: EF 60-65%, no rwma, mild LVH, Gr2 DD, nl RV fxn. Nl pASP. Mildly dil LA. Mild to mod MR.  [name] Anemia   . Anxiety   . Arthritis   . BRCA gene mutation negative 10/2016   NEGATIVE: [employer]  . Breast cancer ([place]) 10/16/2016   T1c,N0;, GRADE I/III, 1.6cm. ER/PR pos  HER2 not over expressed, Right Upper Outer  . Celiac disease   . Depression   . Dyspnea    WITH EXERTION  . GERD (gastroesophageal reflux disease)    OCC  . Glaucoma    right eye  . Headache    H/O MIGRAINES AS TEENAGER  . Heart murmur    a. 08/2019 Echo: mild to mod MR.  [name] Hypertension   . Hypothyroidism   . Non-obstructive CAD (coronary artery disease)    a. 01/2017 MV: Hypertensive response. No ischemia/infarct. EF >65%; b. 09/2019 Cor CTA: Ca2+ = 9.29 (36th percentile). LAD calcified plaque (0-24%), otw nl. Multipel bilat pulm nodules up to 23m.  .Marland KitchenPAF (paroxysmal atrial fibrillation) ([place]    a. 02/2020 Zio: predominantly RSR, 65 (50-105), rare PACs/PVCs, 8 beats NSVT, multiple episodes  of PAF lasting up to 1hr 237ms. Avg AF rate 130 (93-170). AF burden <1%. Triggered events = RSR, PACs, and PAF; b. CHA2DS2VASc = 6.  . Pneumonia    YEARS AGO  . Pulmonary nodules    a. 09/2019 Cor CTA: incidental finding of multiple bilat pulm nodules; b. 12/2019 High Res CT: stable, scattered solid pulm nodules.  . Raynaud's disease   . RLS (restless legs syndrome)     Past Surgical History:  Procedure Laterality Date  . ABDOMINAL HYSTERECTOMY    . APPENDECTOMY    . AXILLARY LYMPH NODE BIOPSY Right 03/05/2017   Procedure: AXILLARY LYMPH NODE BIOPSY;  Surgeon: ByRobert BellowMD;  Location: [facility] ORS;  Service: General;  Laterality: Right;  . CATARACT EXTRACTION Bilateral   . CESAREAN SECTION    . COLONOSCOPY WITH PROPOFOL N/A 05/19/2015   Procedure: COLONOSCOPY WITH PROPOFOL;  Surgeon: RoManya SilvasMD;  [facility];  Service: Endoscopy;  Laterality: N/A;  . COLONOSCOPY WITH PROPOFOL N/A 08/01/2019   Procedure: COLONOSCOPY WITH PROPOFOL;  Surgeon: WoLucilla LameMD;  [facility];  Service: Endoscopy;  Laterality: N/A;  . EYE SURGERY    . HAND SURGERY Left   . MASTECTOMY Right 11/07/2016   RESIDUAL  INVASIVE MAMMARY CARCINOMA, SUBAREOLAR ANTERIOR TO PREVIOUS   . SENTINEL NODE BIOPSY Right 11/07/2016   Procedure: SENTINEL NODE BIOPSY;  Surgeon: Robert Bellow, MD;  Location: [facility] ORS;  Service: General;  Laterality: Right;  . SIMPLE MASTECTOMY WITH AXILLARY SENTINEL NODE BIOPSY Right 11/07/2016   6 mm ER/PR 100%; Her 2 neu not overexpressed, T1b, N0.  Surgeon: Robert Bellow, MD;  Location: [facility] ORS;  Service: General;  Laterality: Right;  . TONSILLECTOMY  AGE 75  . UPPER GI ENDOSCOPY  01/12/06   hiatus hernia    Vitals:   07/28/20 0927  BP: (!) 111/57  Pulse: (!) 55      Subjective Assessment - 07/28/20 0907    Subjective Pt reports she went to the hospital on February 17th after feeling dizzy and having L UE symptoms and headaches for 5 days.  Pt reports due to her history of Afib she called an ambulance but then decided not to go to the ER. She states her "dizziness was so bad it was making me nauseous."  Pt said her daughter told her at the time her speech was slurred. Pt reports her BP was very high.  She states she was in the hospital for about 5 days. She feels her memory has been impacted as well as her vision/depth perception. Pt reports she is not using a walker now because she feels she's "a lot more stable," and states "I feel like I'm back to normal now."    Patient is accompained by: --   friend   Pertinent History Pt is a 75 y/o female who presented to ED on 07/15/2020 due to dizziness where she was diagnosed with a hemorrhagic CVA in L cerebellar dentate nucleus. Pt pertinent PMH includes anemia, heart failure, paroxysmal atrial fibrillation, GERD, breast CA (2018), depression, celiac disease, CAD, restless leg syndrome, hypertension and hypothyroidism. Pt was using a walker upon d/c but reports she has discontinued use of RW.    Limitations Standing;Walking;Lifting;House hold activities    Diagnostic tests Relevant imaging: CT HEAD WO CONTRAST performed 07/15/2020 per chart states: "IMPRESSION:  Focal area of hemorrhage with adjacent edema in the left cerebellar dentate nucleus. Suspect acute hemorrhagic infarct in this area. Elsewhere there is mild atrophy with patchy periventricular small vessel disease. There are foci of arterial vascular calcification. There is mucosal thickening in several ethmoid air cells." 07/28/2020 CT HEAD WO CONTRAST per chart: "IMPRESSION: Resolving left cerebellar intraparenchymal hematoma. No interval  hemorrhage. No associated abnormal mass effect."    Patient Stated Goals PT and pt discussed improving pt's balance    Currently in Pain? No/denies              [facility] PT Assessment - 07/28/20 0915      Assessment   Medical Diagnosis hemorrhagic CVA in L cerebellar dentate nucleus    Referring  Provider (PT) Jerrol Banana., MD    Onset Date/Surgical Date 07/15/20    Hand Dominance Right    Next MD Visit 08/16/2020    Prior Therapy Seen in acute care for PT      Precautions   Precautions None      Restrictions   Weight Bearing Restrictions No      Balance Screen   Has the patient fallen in the past 6 months Yes    How many times? 1   Picked up heavy object and tripped and 'face planted on concrete." Pt reports sore knees and scrapes. This occured this past  Fall.   Has the patient had a decrease in activity level because of a fear of falling?  Yes    Is the patient reluctant to leave their home because of a fear of falling?  No      Home Environment   Living Environment Private residence    Living Arrangements Spouse/significant other    Available Help at Discharge Other (Comment)   she takes care of husband. Reports she has granddaughter who lives close and can help.   Type of [place] to enter    Entrance Stairs-Number of Steps 2    Entrance Stairs-Rails Left;Right    Home Layout Two level    Alternate Level Stairs-Number of Steps 14    Alternate Level Stairs-Rails Right    Home Equipment Walker - 4 wheels;Shower seat;Grab bars - tub/shower;Grab bars - toilet;Toilet riser      Cognition   Overall Cognitive Status Within Functional Limits for tasks assessed    Memory --   pt reports she has memory difficulty     Standardized Balance Assessment   Standardized Balance Assessment Berg Balance Test      Berg Balance Test   Sit to Stand Able to stand without using hands and stabilize independently    Standing Unsupported Able to stand safely 2 minutes    Sitting with Back Unsupported but Feet Supported on Floor or Stool Able to sit safely and securely 2 minutes    Stand to Sit Sits safely with minimal use of hands    Transfers Able to transfer safely, minor use of hands    Standing Unsupported with Eyes Closed Able to stand 10 seconds  safely    Standing Unsupported with Feet Together Able to place feet together independently and stand for 1 minute with supervision    From Standing, Reach Forward with Outstretched Arm Can reach confidently >25 cm (10")    From Standing Position, Pick up Object from Floor Able to pick up shoe safely and easily    From Standing Position, Turn to Look Behind Over each Shoulder Looks behind one side only/other side shows less weight shift    Turn 360 Degrees Able to turn 360 degrees safely in 4 seconds or less    Standing Unsupported, Alternately Place Feet on Step/Stool Able to complete 4 steps without aid or supervision    Standing Unsupported, One Foot in Front Needs help to step but can hold 15 seconds    Standing on One Leg Unable to try or needs assist to prevent fall    Total Score 45          Objective measurements completed on examination: See findings.   FOTO: deferred (to be redone for LE)   PAIN: none  POSTURE: Slight forward head posture and IR/stooped shoulders B   STRENGTH:  Graded on a 0-5 scale Muscle Group Left Right  Hip Flex 4/5 4/5  Hip Abd 5/5 5/5  Hip Ad 5/5 5/5  Knee Flex 5/5 5/5  Knee Ext 5/5 5/5  Ankle DF 4+/5 4+/5  Ankle PF 5/5 5/5   SENSATION: WNL  COORDINATION WNL   BALANCE: see testing (below)   GAIT: Pt ambulates without AD, slight decrease in armswing observed on L side and decrease in hip extension. Pt difficulty ambulating straight path with head turns, deviates to R >6"  OUTCOME MEASURES:  TEST Outcome Interpretation  5 times sit<>stand 8 sec >60 yo, >15 sec indicates increased risk  for falls  10 meter walk test                 1.2 m/s <1.0 m/s indicates increased risk for falls; limited community ambulator  Timed up and Go                 8.7 sec >14 sec indicates increased risk for falls  FGA 20/30 Indicates fall risk possible within next 6 months  Berg Balance Assessment 45/56 <36/56 (100% risk for falls), 37-45 (80% risk for  falls); 46-51 (>50% risk for falls); 52-55 (lower risk <25% of falls)   Assessment: Pt is a pleasant 75 y/o female with recent history of hemorrhagic CVA in L cerebellar dentate nucleus who has been referred to physical therapy for imbalance and gait training/assessment. She presents with decreased strength of B hip flexors and B dorsiflexors, and impaired static and dynamic balance, as indicated by Berg (45/56) and FGA (20/30) scores. Pt's decreased strength and balance scores indicate she is at an increased risk for falls, where pt does report recent history of falls in past 6 months. Balance impairment is evident with gait when involving head turns or narrow BOS, and pt also ambulates with limited arm-swing and hip extension. Pt also presents with impairments of memory and vision per her reports and difficulty with recall of information for history-taking during examination. Pt would benefit from further skilled therapy to improve B LE strength, and static and dynamic balance in order to decrease fall risk.    Visit Diagnosis: Unsteadiness on feet  Muscle weakness (generalized)  Other abnormalities of gait and mobility     Problem List Patient Active Problem List   Diagnosis Date Noted  . Cerebellar hemorrhage, acute ([place]) 07/17/2020  . Hemorrhagic cerebrovascular accident (CVA) ([place]) 07/16/2020  . PAF (paroxysmal atrial fibrillation) ([place])   . Personal history of colonic polyps   . Chronic diastolic heart failure ([place]) 05/17/2017  . Essential hypertension 05/17/2017  . Glaucoma (increased eye pressure) 02/06/2017  . Invasive ductal carcinoma of breast, female, right ([place]) 10/20/2016  . Prolapse of vaginal vault after hysterectomy 08/24/2016  . Vaginal atrophy 08/24/2016  . Dyspareunia in female 08/24/2016  . Colon polyps 01/06/2015  . BCC (basal cell carcinoma of skin) 01/06/2015  . Hypothyroidism 01/06/2015  . Vitamin D deficiency 01/06/2015  . Hyperlipidemia 01/06/2015  .  Depression, major 01/06/2015  . GAD (generalized anxiety disorder) 01/06/2015  . RLS (restless legs syndrome) 01/06/2015  . Cataract 01/06/2015  . Raynaud phenomenon 01/06/2015  . GERD (gastroesophageal reflux disease) 01/06/2015  . Celiac disease 01/06/2015  . OA (osteoarthritis) 01/06/2015  . Osteoporosis 01/06/2015  . Pre-diabetes 01/06/2015  . Avitaminosis D 01/06/2015  . Raynaud's syndrome without gangrene 01/06/2015  . Age-related osteoporosis without current pathological fracture 01/06/2015  . Anxiety disorder 01/05/2015   Ricard Dillon PT, DPT  Zollie Pee 07/28/2020, 5:32 PM  [place] MAIN [facility] SERVICES [address] [place], [place], [address] Phone: [phone]   Fax:  [phone]  Name: TELENA PEYSER MRN: [id number] Date of Birth: Oct 23, 1945

## 2020-08-02 ENCOUNTER — Ambulatory Visit: Payer: Medicare Other

## 2020-08-02 NOTE — Progress Notes (Signed)
Follow-up Outpatient Visit Date: 08/05/2020  Primary Care Provider: Jerrol Banana., MD [address] [place] [address]  Chief Complaint: Follow-up paroxysmal atrial fibrillation and hemorrhagic stroke.  HPI:  Sandra [name] is a 75 y.o. female with history of paroxysmal atrial fibrillation complicated by recent intracranial hemorrhage, HFpEF, right-sided breast cancer, celiac disease, Raunaud's disease, hypothyroidism, RLS, GERD, and anxiety, who presents for follow-up of atrial fibrillation in the setting of recent intracranial hemorrhage.  She was last seen in our office in 04/2020 for follow-up of PAF noted on monitor in September.  She had been started on apixaban 5 mg twice daily and was tolerating it well.  Last month, Sandra [name] had a prolonged episode of atrial fibrillation associated with a severe headache.  EMS evaluated Sandra [name], but she refused transfer to ED.  She subsequently developed dizziness and nausea; she was advised to go to the ED for further evaluation.  There she was found to have an acute hemorrhagic infarct in the left cerebellum.  Today, Sandra [name] feels relatively well.  Her balance is still a little off, though she otherwise denies focal neurologic deficits.  She denies chest pain, palpitations, lightheadedness, and edema.  Chronic exertional dyspnea is stable.  She notes that she had an episode of palpitations that lasted ~3 hours when she called EMS (but refused transport to ED) last month.  EMS advised her that she was in atrial fibrillation at that time.  She has not had any further episodes.  She had been compliant with apixaban leading up to hospitalization last month but is currently only on low-dose aspirin based on the recommendation of Dr. Leonie Man, whom she saw yesterday.  Her blood pressure control has been improving, with home readings over the last day or two being in the  120's/50's.  --------------------------------------------------------------------------------------------------  Past Medical History:  Diagnosis Date  . (HFpEF) heart failure with preserved ejection fraction ([place])    a. 08/2019 Echo: EF 60-65%, no rwma, mild LVH, Gr2 DD, nl RV fxn. Nl pASP. Mildly dil LA. Mild to mod MR.  [name] Anemia   . Anxiety   . Arthritis   . BRCA gene mutation negative 10/2016   NEGATIVE: [employer]  . Breast cancer ([place]) 10/16/2016   T1c,N0;, GRADE I/III, 1.6cm. ER/PR pos  HER2 not over expressed, Right Upper Outer  . Celiac disease   . Depression   . Dyspnea    WITH EXERTION  . GERD (gastroesophageal reflux disease)    OCC  . Glaucoma    right eye  . Headache    H/O MIGRAINES AS TEENAGER  . Heart murmur    a. 08/2019 Echo: mild to mod MR.  [name] Hypertension   . Hypothyroidism   . Non-obstructive CAD (coronary artery disease)    a. 01/2017 MV: Hypertensive response. No ischemia/infarct. EF >65%; b. 09/2019 Cor CTA: Ca2+ = 9.29 (36th percentile). LAD calcified plaque (0-24%), otw nl. Multipel bilat pulm nodules up to 4m.  .Marland KitchenPAF (paroxysmal atrial fibrillation) ([place]    a. 02/2020 Zio: predominantly RSR, 65 (50-105), rare PACs/PVCs, 8 beats NSVT, multiple episodes of PAF lasting up to 1hr 275ms. Avg AF rate 130 (93-170). AF burden <1%. Triggered events = RSR, PACs, and PAF; b. CHA2DS2VASc = 6.  . Pneumonia    YEARS AGO  . Pulmonary nodules    a. 09/2019 Cor CTA: incidental finding of multiple bilat pulm nodules; b. 12/2019 High Res CT: stable, scattered solid pulm nodules.  . Raynaud's  disease   . RLS (restless legs syndrome)    Past Surgical History:  Procedure Laterality Date  . ABDOMINAL HYSTERECTOMY    . APPENDECTOMY    . AXILLARY LYMPH NODE BIOPSY Right 03/05/2017   Procedure: AXILLARY LYMPH NODE BIOPSY;  Surgeon: Robert Bellow, MD;  Location: [facility] ORS;  Service: General;  Laterality: Right;  . CATARACT EXTRACTION Bilateral   . CESAREAN SECTION    .  COLONOSCOPY WITH PROPOFOL N/A 05/19/2015   Procedure: COLONOSCOPY WITH PROPOFOL;  Surgeon: Manya Silvas, MD;  [facility];  Service: Endoscopy;  Laterality: N/A;  . COLONOSCOPY WITH PROPOFOL N/A 08/01/2019   Procedure: COLONOSCOPY WITH PROPOFOL;  Surgeon: Lucilla Lame, MD;  Location[facility] END[facility];  Service: Endoscopy;  Laterality: N/A;  . EYE SURGERY    . HAND SURGERY Left   . MASTECTOMY Right 11/07/2016   RESIDUAL INVASIVE MAMMARY CARCINOMA, SUBAREOLAR ANTERIOR TO PREVIOUS   . SENTINEL NODE BIOPSY Right 11/07/2016   Procedure: SENTINEL NODE BIOPSY;  Surgeon: Robert Bellow, MD;  Location: [facility] ORS;  Service: General;  Laterality: Right;  . SIMPLE MASTECTOMY WITH AXILLARY SENTINEL NODE BIOPSY Right 11/07/2016   6 mm ER/PR 100%; Her 2 neu not overexpressed, T1b, N0.  Surgeon: Robert Bellow, MD;  Location: [facility] ORS;  Service: General;  Laterality: Right;  . TONSILLECTOMY  AGE 35  . UPPER GI ENDOSCOPY  01/12/06   hiatus hernia    Current Meds  Medication Sig  . acetaminophen (TYLENOL) 500 MG tablet Take 500-1,000 mg by mouth every 6 (six) hours as needed for mild pain or moderate pain.  [name] ALPRAZolam (XANAX) 0.5 MG tablet TAKE 1 TABLET BY MOUTH AT  BEDTIME  . amLODipine (NORVASC) 10 MG tablet Take 1 tablet (10 mg total) by mouth daily.  [name] atorvastatin (LIPITOR) 40 MG tablet Take 40 mg by mouth daily.  . Calcium Carb-Cholecalciferol 500-400 MG-UNIT TABS Take 1-2 tablets by mouth daily.  . carvedilol (COREG) 6.25 MG tablet Take 1 tablet (6.25 mg total) by mouth 2 (two) times daily with a meal.  . Cholecalciferol (VITAMIN D3) 125 MCG (5000 UT) TABS Take 125 Units by mouth daily.  . hydroxypropyl methylcellulose / hypromellose (ISOPTO TEARS / GONIOVISC) 2.5 % ophthalmic solution Place 1 drop into both eyes 4 (four) times daily as needed for dry eyes.  [name] latanoprost (XALATAN) 0.005 % ophthalmic solution Place 1 drop into both eyes at bedtime.  [name] letrozole (FEMARA) 2.5 MG  tablet Take 1 tablet (2.5 mg total) by mouth daily.  [name] levothyroxine (SYNTHROID) 100 MCG tablet TAKE 1 TABLET BY MOUTH 30  MINUTES BEFORE BREAKFAST (Patient taking differently: TAKE 1 TABLET BY MOUTH 30  MINUTES BEFORE BREAKFAST)  . Multiple Vitamin (MULTIVITAMIN WITH MINERALS) TABS tablet Take 1 tablet by mouth daily at 3 pm.  . omeprazole (PRILOSEC) 40 MG capsule TAKE 1 CAPSULE BY MOUTH  DAILY  . sennosides-docusate sodium (SENOKOT-S) 8.6-50 MG tablet Take 1 tablet by mouth as needed for constipation.  . sertraline (ZOLOFT) 100 MG tablet TAKE 1 TABLET BY MOUTH  DAILY  . timolol (TIMOPTIC) 0.5 % ophthalmic solution Place 1 drop into both eyes every morning.    Allergies: Patient has no known allergies.  Social History   Tobacco Use  . Smoking status: Never Smoker  . Smokeless tobacco: Never Used  Vaping Use  . Vaping Use: Never used  Substance Use Topics  . Alcohol use: Not Currently  . Drug use: No    Family History  Problem Relation Age of Onset  . Cancer Mother   . Anemia Mother   . Other Mother        lymphosarcoma  . Alcohol abuse Father   . Prostate cancer Father   . Breast cancer Sister        23; 1/2 sister, shared mother.  . Cancer Sister        bile duct  . Stroke Maternal Grandmother   . Breast cancer Maternal Grandmother 68  . Heart attack Maternal Grandfather   . CAD Paternal Grandfather     Review of Systems: A 12-system review of systems was performed and was negative except as noted in the HPI.  --------------------------------------------------------------------------------------------------  Physical Exam: BP (!) 108/52 (BP Location: Left Arm, Patient Position: Sitting, Cuff Size: Normal)   Pulse (!) 56   Ht 5' 3"  (1.6 m)   Wt 133 lb 2 oz (60.4 kg)   SpO2 99%   BMI 23.58 kg/m   General:  NAD, accompanied by her daughter. Neck: No JVD or HJR. Lungs: Clear to auscultation bilaterally without wheezes or crackles. Heart: Bradycardic but regular  without murmurs, rubs, or gallops. Abdomen: Soft, nontender, nondistended. Extremities: No lower extremity edema.  EKG:  Sinus bradycardia with poor R-wave progression, LVH, and anterolateral T-wave inversions, likely reflecting abnormal repolarization. No significant change since 07/15/2020.  Lab Results  Component Value Date   WBC 5.3 07/22/2020   HGB 12.2 07/22/2020   HCT 38.5 07/22/2020   MCV 87 07/22/2020   PLT 254 07/22/2020    Lab Results  Component Value Date   NA 142 07/22/2020   K 4.1 07/22/2020   CL 102 07/22/2020   CO2 25 07/22/2020   BUN 14 07/22/2020   CREATININE 0.68 07/22/2020   GLUCOSE 100 (H) 07/22/2020   ALT 25 07/22/2020    Lab Results  Component Value Date   CHOL 137 07/17/2020   HDL 50 07/17/2020   LDLCALC 68 07/17/2020   TRIG 95 07/17/2020   CHOLHDL 2.7 07/17/2020    --------------------------------------------------------------------------------------------------  ASSESSMENT AND PLAN: Paroxysmal atrial fibrillation and hemorrhagic stroke: Sandra [name] noted a 3 hour episodes of palpitations last month in the setting of worsening dizziness and balance issues that ultimately led to hospitalization for acute nontraumatic cerebellar hemorrhage.  Anticoagulation is currently on hold, in he setting of a CHADSASC score of at least 40 (age, gender x 2, HFpEF +/- hemorrhagic stroke).  I am reluctant to rechallenge Sandra [name] with anticoagulation at this time.  I have recommended consultation with Dr. [name] for ongoing management of her PAF, including consideration for rhythm control and left atrial occlusion device placement.  In the meantime, we will have her continue her current doses of aspirin and carvedilol.  Chronic HFpEF: Sandra [name] appears euvolemic with stable NYHA class II symptoms.  No medication changes recommended today.  Hypertension: BP very well-controlled today, with borderline low DBP noted.  We discussed deescalation of her antihypertensie  regimen but in the setting of higher BP readings at home and recent hemorrhage stroke, we will continue current medications.  Hyperlipidemia: LDL at goal on last check last month in the setting of mild, nonobstructive CAD.  Continue atorvastatin 40 mg daily.  Follow-up: Return to clinic in 3 months.  Nelva Bush, MD 08/05/2020 11:49 AM

## 2020-08-03 ENCOUNTER — Other Ambulatory Visit: Payer: Self-pay

## 2020-08-03 ENCOUNTER — Ambulatory Visit: Payer: Medicare Other

## 2020-08-03 ENCOUNTER — Encounter: Payer: Self-pay | Admitting: Physical Therapy

## 2020-08-03 DIAGNOSIS — R2681 Unsteadiness on feet: Secondary | ICD-10-CM

## 2020-08-03 DIAGNOSIS — R2689 Other abnormalities of gait and mobility: Secondary | ICD-10-CM | POA: Diagnosis not present

## 2020-08-03 DIAGNOSIS — M6281 Muscle weakness (generalized): Secondary | ICD-10-CM

## 2020-08-03 NOTE — Therapy (Signed)
[place] MAIN [facility] SERVICES [address] [place], [place], [address] Phone: [phone]   Fax:  [phone]  Physical Therapy Treatment  Patient Details  Name: Sandra [name] MRN: [id number] Date of Birth: 1945/09/20 Referring Provider (PT): Jerrol Banana., MD   Encounter Date: 08/03/2020   PT End of Session - 08/03/20 0837    Visit Number 2    Number of Visits 25    Date for PT Re-Evaluation 10/20/20    Authorization Type eval performed 11/04/6293 (recert for 2/84/1324)    Progress Note Due on Visit 10    PT Start Time 0841    PT Stop Time 0929    PT Time Calculation (min) 48 min    Equipment Utilized During Treatment Gait belt    Activity Tolerance Patient tolerated treatment well    Behavior During Therapy [facility] for tasks assessed/performed           Past Medical History:  Diagnosis Date  . (HFpEF) heart failure with preserved ejection fraction ([place])    a. 08/2019 Echo: EF 60-65%, no rwma, mild LVH, Gr2 DD, nl RV fxn. Nl pASP. Mildly dil LA. Mild to mod MR.  [name] Anemia   . Anxiety   . Arthritis   . BRCA gene mutation negative 10/2016   NEGATIVE: [employer]  . Breast cancer ([place]) 10/16/2016   T1c,N0;, GRADE I/III, 1.6cm. ER/PR pos  HER2 not over expressed, Right Upper Outer  . Celiac disease   . Depression   . Dyspnea    WITH EXERTION  . GERD (gastroesophageal reflux disease)    OCC  . Glaucoma    right eye  . Headache    H/O MIGRAINES AS TEENAGER  . Heart murmur    a. 08/2019 Echo: mild to mod MR.  [name] Hypertension   . Hypothyroidism   . Non-obstructive CAD (coronary artery disease)    a. 01/2017 MV: Hypertensive response. No ischemia/infarct. EF >65%; b. 09/2019 Cor CTA: Ca2+ = 9.29 (36th percentile). LAD calcified plaque (0-24%), otw nl. Multipel bilat pulm nodules up to 27m.  .Marland KitchenPAF (paroxysmal atrial fibrillation) ([place]    a. 02/2020 Zio: predominantly RSR, 65 (50-105), rare PACs/PVCs, 8 beats NSVT, multiple episodes of  PAF lasting up to 1hr 272ms. Avg AF rate 130 (93-170). AF burden <1%. Triggered events = RSR, PACs, and PAF; b. CHA2DS2VASc = 6.  . Pneumonia    YEARS AGO  . Pulmonary nodules    a. 09/2019 Cor CTA: incidental finding of multiple bilat pulm nodules; b. 12/2019 High Res CT: stable, scattered solid pulm nodules.  . Raynaud's disease   . RLS (restless legs syndrome)     Past Surgical History:  Procedure Laterality Date  . ABDOMINAL HYSTERECTOMY    . APPENDECTOMY    . AXILLARY LYMPH NODE BIOPSY Right 03/05/2017   Procedure: AXILLARY LYMPH NODE BIOPSY;  Surgeon: ByRobert BellowMD;  Location: [facility] ORS;  Service: General;  Laterality: Right;  . CATARACT EXTRACTION Bilateral   . CESAREAN SECTION    . COLONOSCOPY WITH PROPOFOL N/A 05/19/2015   Procedure: COLONOSCOPY WITH PROPOFOL;  Surgeon: RoManya SilvasMD;  [facility];  Service: Endoscopy;  Laterality: N/A;  . COLONOSCOPY WITH PROPOFOL N/A 08/01/2019   Procedure: COLONOSCOPY WITH PROPOFOL;  Surgeon: WoLucilla LameMD;  [facility];  Service: Endoscopy;  Laterality: N/A;  . EYE SURGERY    . HAND SURGERY Left   . MASTECTOMY Right 11/07/2016   RESIDUAL  INVASIVE MAMMARY CARCINOMA, SUBAREOLAR ANTERIOR TO PREVIOUS   . SENTINEL NODE BIOPSY Right 11/07/2016   Procedure: SENTINEL NODE BIOPSY;  Surgeon: Robert Bellow, MD;  Location: [facility] ORS;  Service: General;  Laterality: Right;  . SIMPLE MASTECTOMY WITH AXILLARY SENTINEL NODE BIOPSY Right 11/07/2016   6 mm ER/PR 100%; Her 2 neu not overexpressed, T1b, N0.  Surgeon: Robert Bellow, MD;  Location: [facility] ORS;  Service: General;  Laterality: Right;  . TONSILLECTOMY  AGE 51  . UPPER GI ENDOSCOPY  01/12/06   hiatus hernia    There were no vitals filed for this visit.     Subjective Assessment - 08/03/20 0842    Subjective Pt reports she "over did it" over the weekend and was very tired the following day. Also had a bout of low BP yesterday but her BP was "fine"  this morning. No falls.    Patient is accompained by: --   friend   Pertinent History Pt is a 75 y/o female who presented to ED on 07/15/2020 due to dizziness where she was diagnosed with a hemorrhagic CVA in L cerebellar dentate nucleus. Pt pertinent PMH includes anemia, heart failure, paroxysmal atrial fibrillation, GERD, breast CA (2018), depression, celiac disease, CAD, restless leg syndrome, hypertension and hypothyroidism. Pt was using a walker upon d/c but reports she has discontinued use of RW.    Limitations Standing;Walking;Lifting;House hold activities    Diagnostic tests Relevant imaging: CT HEAD WO CONTRAST performed 07/15/2020 per chart states: "IMPRESSION:  Focal area of hemorrhage with adjacent edema in the left cerebellar dentate nucleus. Suspect acute hemorrhagic infarct in this area. Elsewhere there is mild atrophy with patchy periventricular small vessel disease. There are foci of arterial vascular calcification. There is mucosal thickening in several ethmoid air cells." 07/28/2020 CT HEAD WO CONTRAST per chart: "IMPRESSION: Resolving left cerebellar intraparenchymal hematoma. No interval  hemorrhage. No associated abnormal mass effect."    Patient Stated Goals PT and pt discussed improving pt's balance    Currently in Pain? No/denies    Pain Score 0-No pain           Vitals:   BP: 110/72 mm Hg  HR: 59 BPM  SPO2: 100%    Neuro Re-Ed:  Alternating foot taps on 4" step. 1x30 sec with no weight. Noted decreased proprioception of LE's with diffculty with feet landnig onto step. 2x30 sec, no UE support and 2.5 # AW's for proprioceptive feedback on LE's. Improved carryover after additions of AW's. Performed in  // bars  Backwards walking to promote Hip extension strength and challenge dynamic balance (86'x4) CGA+1. Intermittent sway and variable step lengths with decreased gait speed. Pt able to indep correct sway and regain balance.  Walking with head turns (vertical/horizontal):  86' x4 vertical and horizontal. Slight variable step lengths and very minimal sway and decreased gait speed. CGA+1 throughout. . Increased difficulty adding dual task of holding conversation increasing lat sway and variable step lengths.  Tandem walking (86'x4). Noted ambulation veering to left. Use of visual cue of line separating blue tile and cream tile to help improve proprioception with straight walking. Improved carryover after cuing. Increased difficulty adding dual task of holding conversation increasing lat sway and variable step lengths. CGA+1 throughout.  FOTO for LE: 56/71      CGA+1 throughout therapy session. Pt displays intermittent LOB during quiet standing requiring stepping response to correct and CGA+1 from therapist to reduce risk of falls.    PT Education - 08/03/20 1028  Education Details Dynamic balance exercise form/technique. Use of stationary reference point with walking so pt does not veer left during ambulation.    Person(s) Educated Patient    Methods Explanation;Demonstration;Verbal cues    Comprehension Verbalized understanding;Returned demonstration            PT Short Term Goals - 07/28/20 1700      PT SHORT TERM GOAL #1   Title Patient will be independent in home exercise program to improve strength/mobility for better functional independence with ADLs.    Baseline 07/28/2020 Eval completed. HEP to be initiated next session    Time 6    Period Weeks    Status New    Target Date 09/08/20             PT Long Term Goals - 08/03/20 1040      PT LONG TERM GOAL #1   Title Patient will increase Functional Gait Assessment score to >24/30 as to reduce fall risk and demonstrate meaningful improvement in dynamic gait safety with community ambulation.    Baseline 07/28/2020 Pt FGA score 20/30    Time 12    Period Weeks    Status New      PT LONG TERM GOAL #2   Title Patient will increase Berg Balance score by > 6 points to demonstrate decreased fall risk  during functional activities.    Baseline 07/28/2020 BERG score 45/56    Time 12    Period Weeks    Status New      PT LONG TERM GOAL #3   Title Pt will demonstrate improvement in B hip flexion and B ankle dorsiflexion by at least 1/2 point on MMT to indicate improvements in functional mobility.    Baseline 07/28/2020 Hip flexors 4/5 B, ankle dorsiflexors 4+/5 B    Time 12    Period Weeks    Status New      PT LONG TERM GOAL #4   Title Pt will demonstrate ability to maintain SLB for at least 5 seconds on B LES in order to improve safety with navigating over and around objects.    Baseline 07/28/2020 Pt unable to perform, requires assistance to prevent fall when she attempts    Time 12    Period Weeks    Status New      PT LONG TERM GOAL #5   Title Pt will improve FOTO to target score to display perceived improvements in ability to complete ADL's.    Baseline 3/8: 56/71    Time 12    Period Weeks    Status New    Target Date 10/20/20                 Plan - 08/03/20 1029    Clinical Impression Statement Pt displayed good tolerance to dynamic balane exercises today. Decreased proprioception in LE's noted with step ups with intermittent difficulty getitng foot onto step. Use of AW's to improve proprioception with stepping tasks. Improved carryover after implementing weights. With dynamic balance training pt dislpayed variable step lengths and decreased gait speed to complete tasks with lateral sway with dual task of attempting to hold conversation while performing tasks. Education provided on techniques to improve walking linearly due to pt reports of veering left when ambulating sense her stroke. Pt can continue to benefit from skilled PT to improve dynamic balance to reduce risk of falls.    Personal Factors and Comorbidities Age;Comorbidity 3+;Sex;Comorbidity 1;Comorbidity 2;Transportation    Comorbidities other pertinent comorbidities  include anemia, heart failure, paroxysmal atrial  fibrillation, GERD, breast CA (2018), depression, celiac disease, CAD, restless leg syndrome, hypertension and hypothyroidism.    Examination-Activity Limitations Lift;Locomotion Level;Carry;Stand;Stairs;Bathing;Reach Overhead;Squat;Bend    Examination-Participation Restrictions Medication Management;Community Activity;Driving;Shop;Yard Work;Laundry;Cleaning    Stability/Clinical Decision Making Stable/Uncomplicated    Clinical Decision Making Moderate    Rehab Potential Good    PT Frequency 2x / week    PT Duration 12 weeks    PT Treatment/Interventions ADLs/Self Care Home Management;Canalith Repostioning;Cryotherapy;Electrical Stimulation;Moist Heat;Traction;Ultrasound;DME Instruction;Gait training;Stair training;Functional mobility training;Therapeutic activities;Therapeutic exercise;Balance training;Neuromuscular re-education;Patient/family education;Orthotic Fit/Training;Manual techniques;Passive range of motion;Vestibular;Visual/perceptual remediation/compensation;Joint Manipulations    PT Next Visit Plan Dynamic balance    PT Home Exercise Plan To be initiated next session    Consulted and Agree with Plan of Care Patient;Other (Comment)   Pt with friend for eval          Patient will benefit from skilled therapeutic intervention in order to improve the following deficits and impairments:  Abnormal gait,Decreased balance,Difficulty walking,Dizziness,Improper body mechanics,Decreased coordination,Decreased activity tolerance,Postural dysfunction,Impaired vision/preception,Decreased mobility,Decreased range of motion,Decreased strength,Impaired flexibility,Hypomobility  Visit Diagnosis: Unsteadiness on feet  Muscle weakness (generalized)  Other abnormalities of gait and mobility     Problem List Patient Active Problem List   Diagnosis Date Noted  . Cerebellar hemorrhage, acute ([place]) 07/17/2020  . Hemorrhagic cerebrovascular accident (CVA) ([place]) 07/16/2020  . PAF (paroxysmal  atrial fibrillation) ([place])   . Personal history of colonic polyps   . Chronic diastolic heart failure ([place]) 05/17/2017  . Essential hypertension 05/17/2017  . Glaucoma (increased eye pressure) 02/06/2017  . Invasive ductal carcinoma of breast, female, right ([place]) 10/20/2016  . Prolapse of vaginal vault after hysterectomy 08/24/2016  . Vaginal atrophy 08/24/2016  . Dyspareunia in female 08/24/2016  . Colon polyps 01/06/2015  . BCC (basal cell carcinoma of skin) 01/06/2015  . Hypothyroidism 01/06/2015  . Vitamin D deficiency 01/06/2015  . Hyperlipidemia 01/06/2015  . Depression, major 01/06/2015  . GAD (generalized anxiety disorder) 01/06/2015  . RLS (restless legs syndrome) 01/06/2015  . Cataract 01/06/2015  . Raynaud phenomenon 01/06/2015  . GERD (gastroesophageal reflux disease) 01/06/2015  . Celiac disease 01/06/2015  . OA (osteoarthritis) 01/06/2015  . Osteoporosis 01/06/2015  . Pre-diabetes 01/06/2015  . Avitaminosis D 01/06/2015  . Raynaud's syndrome without gangrene 01/06/2015  . Age-related osteoporosis without current pathological fracture 01/06/2015  . Anxiety disorder 01/05/2015    Salem Caster. Fairly IV, PT, DPT Physical Therapist- [facility]  08/03/2020, 10:56 AM  [place] MAIN [facility] SERVICES [address] [place], [place], [address] Phone: [phone]   Fax:  [phone]  Name: Sandra [name] MRN: [id number] Date of Birth: [birth year]

## 2020-08-04 ENCOUNTER — Ambulatory Visit: Payer: Medicare Other | Admitting: Neurology

## 2020-08-04 ENCOUNTER — Other Ambulatory Visit: Payer: Self-pay | Admitting: Family Medicine

## 2020-08-04 ENCOUNTER — Encounter: Payer: Self-pay | Admitting: Neurology

## 2020-08-04 VITALS — BP 116/59 | HR 62 | Ht 63.0 in | Wt 134.4 lb

## 2020-08-04 DIAGNOSIS — G3184 Mild cognitive impairment, so stated: Secondary | ICD-10-CM | POA: Diagnosis not present

## 2020-08-04 DIAGNOSIS — R413 Other amnesia: Secondary | ICD-10-CM | POA: Diagnosis not present

## 2020-08-04 DIAGNOSIS — R1084 Generalized abdominal pain: Secondary | ICD-10-CM

## 2020-08-04 DIAGNOSIS — I614 Nontraumatic intracerebral hemorrhage in cerebellum: Secondary | ICD-10-CM

## 2020-08-04 NOTE — Progress Notes (Signed)
[facility] [address] [place]. [place] [address] [phone]       OFFICE CONSULT NOTE  [name] Date of Birth:  1945/10/06 Medical Record Number:  [id number]   Referring MD:  Sarita Haver  Reason for Referral:  Brain Hemorrhage  HPI: [name] is a pleasant 83 year Caucasian lady seen todat  for first office consult visit for brain hemorrhage.She is accompanied by her husband and daughter.I have personally reviewed HPI in person, electronic medical records and pertinent imaging films in PACS. BELVA KOZIEL is a 75 y.o. female with PMH significant for anemia, GERD, hx of Breast cancer(2018), depression, celiac disease, CAD, pAfibb on Eliquis, Restless leg syndrome who presents with mild occipital headache and room spinning since 07/11/20 along with difficult with left hand clumsiness and dropping things with her left hand. She did not think too much of the symptoms and thought this was just a regular headache until daughter noticed and brought her to the ED. Endorses nausea, photophobia. Workup with CTH without contrast with a 1cm focal area of hemorrhage in the left cerebellum dentate nucleus. Her Eliquis was reversed with Kcentra and she was started on Clevidipine for blood pressure control. Repeat CTH in 6 hours showed stable hemorrhage. Further  workup with MRI with no underlying lesion, MRA with no anueryem or vascualr malformation. EF of 65-70% with grade 2 diastolic dysfunction and moderate LVH. No shunt. LDL of 68 with HbA1c of 6.3.She states she is doing well and dizziness and balance are improving and she is currently doing outpatient therapies. She fatigues easily and gets tired and gets off balance sometimes.Family has noticed increasing memory and cognitive deficits which are worse than her baseline memory problems she had even prior. She wants to drive but family ahs concerns. She has started aspirin now. She has appt with Dr end cardiologist this week to  discuss possible watchman device. ROS:    14 system review of systems is positive for  Dizziness, imbalance,memory loss, difficulty focussing, multitasking and all systems negative  PMH:  Past Medical History:  Diagnosis Date  . (HFpEF) heart failure with preserved ejection fraction ([place])    a. 08/2019 Echo: EF 60-65%, no rwma, mild LVH, Gr2 DD, nl RV fxn. Nl pASP. Mildly dil LA. Mild to mod MR.  [name] Anemia   . Anxiety   . Arthritis   . BRCA gene mutation negative 10/2016   NEGATIVE: [employer]  . Breast cancer ([place]) 10/16/2016   T1c,N0;, GRADE I/III, 1.6cm. ER/PR pos  HER2 not over expressed, Right Upper Outer  . Celiac disease   . Depression   . Dyspnea    WITH EXERTION  . GERD (gastroesophageal reflux disease)    OCC  . Glaucoma    right eye  . Headache    H/O MIGRAINES AS TEENAGER  . Heart murmur    a. 08/2019 Echo: mild to mod MR.  [name] Hypertension   . Hypothyroidism   . Non-obstructive CAD (coronary artery disease)    a. 01/2017 MV: Hypertensive response. No ischemia/infarct. EF >65%; b. 09/2019 Cor CTA: Ca2+ = 9.29 (36th percentile). LAD calcified plaque (0-24%), otw nl. Multipel bilat pulm nodules up to 49m.  .Marland KitchenPAF (paroxysmal atrial fibrillation) ([place]    a. 02/2020 Zio: predominantly RSR, 65 (50-105), rare PACs/PVCs, 8 beats NSVT, multiple episodes of PAF lasting up to 1hr 259ms. Avg AF rate 130 (93-170). AF burden <1%. Triggered events = RSR, PACs, and PAF; b. CHA2DS2VASc = 6.  .[name]  Pneumonia    YEARS AGO  . Pulmonary nodules    a. 09/2019 Cor CTA: incidental finding of multiple bilat pulm nodules; b. 12/2019 High Res CT: stable, scattered solid pulm nodules.  . Raynaud's disease   . RLS (restless legs syndrome)     Social History:  Social History   Socioeconomic History  . Marital status: Married    Spouse name: Nadara Mustard  . Number of children: 2  . Years of education: Not on file  . Highest education level: Some college, no degree  Occupational History  . Occupation:  retired  Tobacco Use  . Smoking status: Never Smoker  . Smokeless tobacco: Never Used  Vaping Use  . Vaping Use: Never used  Substance and Sexual Activity  . Alcohol use: Not Currently  . Drug use: No  . Sexual activity: Not Currently    Birth control/protection: None  Other Topics Concern  . Not on file  Social History Narrative   Lives with Husband   Right handed   Drinks 2-3 cups caffiene daily   Social Determinants of Health   Financial Resource Strain: Low Risk   . Difficulty of Paying Living Expenses: Not hard at all  Food Insecurity: No Food Insecurity  . Worried About Charity fundraiser in the Last Year: Never true  . Ran Out of Food in the Last Year: Never true  Transportation Needs: No Transportation Needs  . Lack of Transportation (Medical): No  . Lack of Transportation (Non-Medical): No  Physical Activity: Inactive  . Days of Exercise per Week: 0 days  . Minutes of Exercise per Session: 0 min  Stress: No Stress Concern Present  . Feeling of Stress : Only a little  Social Connections: Socially Integrated  . Frequency of Communication with Friends and Family: More than three times a week  . Frequency of Social Gatherings with Friends and Family: More than three times a week  . Attends Religious Services: More than 4 times per year  . Active Member of Clubs or Organizations: Yes  . Attends Archivist Meetings: More than 4 times per year  . Marital Status: Married  Human resources officer Violence: Not At Risk  . Fear of Current or Ex-Partner: No  . Emotionally Abused: No  . Physically Abused: No  . Sexually Abused: No    Medications:   Current Outpatient Medications on File Prior to Visit  Medication Sig Dispense Refill  . acetaminophen (TYLENOL) 500 MG tablet Take 500-1,000 mg by mouth every 6 (six) hours as needed for mild pain or moderate pain.    [name] ALPRAZolam (XANAX) 0.5 MG tablet TAKE 1 TABLET BY MOUTH AT  BEDTIME (Patient taking differently: Take  0.5 mg by mouth at bedtime.) 90 tablet 0  . amLODipine (NORVASC) 10 MG tablet Take 1 tablet (10 mg total) by mouth daily. 30 tablet 3  . atorvastatin (LIPITOR) 40 MG tablet Take 40 mg by mouth daily.    . Calcium Carb-Cholecalciferol 500-400 MG-UNIT TABS Take 1-2 tablets by mouth daily.    . carvedilol (COREG) 6.25 MG tablet Take 1 tablet (6.25 mg total) by mouth 2 (two) times daily with a meal. 60 tablet 0  . Cholecalciferol (VITAMIN D3) 125 MCG (5000 UT) TABS Take 125 Units by mouth daily.    . hydroxypropyl methylcellulose / hypromellose (ISOPTO TEARS / GONIOVISC) 2.5 % ophthalmic solution Place 1 drop into both eyes 4 (four) times daily as needed for dry eyes.    [name] latanoprost (XALATAN)  0.005 % ophthalmic solution Place 1 drop into both eyes at bedtime.  5  . letrozole (FEMARA) 2.5 MG tablet Take 1 tablet (2.5 mg total) by mouth daily. 90 tablet 3  . levothyroxine (SYNTHROID) 100 MCG tablet TAKE 1 TABLET BY MOUTH 30  MINUTES BEFORE BREAKFAST (Patient taking differently: Take 100 mcg by mouth daily before breakfast.) 90 tablet 2  . Multiple Vitamin (MULTIVITAMIN WITH MINERALS) TABS tablet Take 1 tablet by mouth daily at 3 pm.    . omeprazole (PRILOSEC) 40 MG capsule TAKE 1 CAPSULE BY MOUTH  DAILY (Patient taking differently: Take 40 mg by mouth daily.) 90 capsule 3  . sennosides-docusate sodium (SENOKOT-S) 8.6-50 MG tablet Take 1 tablet by mouth as needed for constipation.    . sertraline (ZOLOFT) 100 MG tablet TAKE 1 TABLET BY MOUTH  DAILY (Patient taking differently: Take 100 mg by mouth daily.) 90 tablet 1  . timolol (TIMOPTIC) 0.5 % ophthalmic solution Place 1 drop into both eyes every morning.     No current facility-administered medications on file prior to visit.    Allergies:  No Known Allergies  Physical Exam General: frail elderly caucasian lady  seated, in no evident distress Head: head normocephalic and atraumatic.   Neck: supple with no carotid or supraclavicular  bruits Cardiovascular: regular rate and rhythm, no murmurs Musculoskeletal: no deformity Skin:  no rash/petichiae Vascular:  Normal pulses all extremities  Neurologic Exam Mental Status: Awake and fully alert. Oriented to place and time. Recent and remote memory intact. Attention span, concentration and fund of knowledge appropriate. Mood and affect appropriate. Diminished recall 2/3. AFT 14 animals which can walk on 4 legs.can copy intersecting pentagons well. Cranial Nerves: Fundoscopic exam reveals sharp disc margins. Pupils equal, briskly reactive to light. Extraocular movements full without nystagmus. Visual fields full to confrontation. Hearing intact. Facial sensation intact. Face, tongue, palate moves normally and symmetrically.  Motor: Normal bulk and tone. Normal strength in all tested extremity muscles. Sensory.: intact to touch , pinprick , position and vibratory sensation.  Coordination: Rapid alternating movements normal in all extremities. Finger-to-nose and heel-to-shin performed accurately bilaterally. Gait and Station: Arises from chair without difficulty. Stance is normal. Gait demonstrates normal stride length and balance . Able to heel, toe and tandem walk with moderate difficulty.  Reflexes: 1+ and symmetric. Toes downgoing.   NIHSS  0 Modified Rankin  2  ASSESSMENT: 21 year Caucasian lady with small left cerebellar hemorrhage in February 2021 from atrial fibrillation while anticoagulated with eliquis. She is doing well but has mild post stroke cognitive impairment.     PLAN: I had a long discussion with the patient, her husband and daughter regarding her recent cerebellar hemorrhage secondary to atrial fibrillation while anticoagulated with Eliquis.  Recommend she continue aspirin 81 mg daily for now but consider possible participation in the ASPIRE study ( aspirin versus apixaban ).  She also has an upcoming appointment with a cardiologist to discuss possible left  atrial appendix closure with watchman device.  Maintain strict control of hypertension with blood pressure goal below 130/90.  Continue aggressive risk factor modification for stroke prevention.  She also has mild cognitive impairment which is age-appropriate and may have been slightly worsened by her intracerebral hemorrhage.  Recommend she increase participation in cognitively challenging activities like solving crossword puzzles, playing bridge and sodoku.  We also discussed memory compensation strategies.  She will return for follow-up in the future in 3 months or call earlier if necessary.I have spent a  total of    45 minutes  with the patient reviewing hospital notes,  test results, labs and examining the patient as well as establishing an assessment and plan that was discussed personally with the patient.  > 50% of time was spent in direct patient care.    Antony Contras, MD Note: This document was prepared with digital dictation and possible smart phrase technology. Any transcriptional errors that result from this process are unintentional.

## 2020-08-04 NOTE — Patient Instructions (Signed)
I had a long discussion with the patient, her husband and daughter regarding her recent cerebellar hemorrhage secondary to atrial fibrillation while anticoagulated with Eliquis.  Recommend she continue aspirin 81 mg daily for now but consider possible participation in the ASPIRE study ( aspirin versus apixaban ).  She also has an upcoming appointment with a cardiologist to discuss possible left atrial appendix closure with watchman device.  Maintain strict control of hypertension with blood pressure goal below 130/90.  Continue aggressive risk factor modification for stroke prevention.  She also has mild cognitive impairment which is age-appropriate and may have been slightly worsened by her intracerebral hemorrhage.  Recommend she increase participation in cognitively challenging activities like solving crossword puzzles, playing bridge and sodoku.  We also discussed memory compensation strategies.  She will return for follow-up in the future in 3 months or call earlier if necessary. mem comp Memory Compensation Strategies  1. Use "WARM" strategy.  W= write it down  A= associate it  R= repeat it  M= make a mental note  2.   You can keep a Social worker.  Use a 3-ring notebook with sections for the following: calendar, important names and phone numbers,  medications, doctors' names/phone numbers, lists/reminders, and a section to journal what you did  each day.   3.    Use a calendar to write appointments down.  4.    Write yourself a schedule for the day.  This can be placed on the calendar or in a separate section of the Memory Notebook.  Keeping a  regular schedule can help memory.  5.    Use medication organizer with sections for each day or morning/evening pills.  You may need help loading it  6.    Keep a basket, or pegboard by the door.  Place items that you need to take out with you in the basket or on the pegboard.  You may also want to  include a message board for reminders.  7.     Use sticky notes.  Place sticky notes with reminders in a place where the task is performed.  For example: " turn off the  stove" placed by the stove, "lock the door" placed on the door at eye level, " take your medications" on  the bathroom mirror or by the place where you normally take your medications.  8.    Use alarms/timers.  Use while cooking to remind yourself to check on food or as a reminder to take your medicine, or as a  reminder to make a call, or as a reminder to perform another task, etc.

## 2020-08-04 NOTE — Telephone Encounter (Signed)
Requested Prescriptions  Pending Prescriptions Disp Refills  . omeprazole (PRILOSEC) 40 MG capsule [Pharmacy Med Name: Omeprazole 40 MG Oral Capsule Delayed Release] 90 capsule 1    Sig: TAKE 1 CAPSULE BY MOUTH  DAILY     Gastroenterology: Proton Pump Inhibitors Passed - 08/04/2020  9:53 PM      Passed - Valid encounter within last 12 months    Recent Outpatient Visits          1 week ago Hemorrhagic stroke [facility])   [facility] Jerrol Banana., MD   1 month ago [place] Jerrol Banana., MD   7 months ago Celiac disease   [facility] Jerrol Banana., MD   8 months ago Chronic obstructive pulmonary disease, unspecified COPD type [facility])   [facility] Jerrol Banana., MD   9 months ago Multiple lung nodules on [place] Jerrol Banana., MD      Future Appointments            [place] End, Harrell Gave, MD [facility], [place]   In 1 week Jerrol Banana., MD [facility], [place]   In 2 weeks End, Harrell Gave, MD [facility]   In 2 weeks Brand Males, MD [facility]   In 2 months Jerrol Banana., MD [facility], [place]

## 2020-08-05 ENCOUNTER — Ambulatory Visit (INDEPENDENT_AMBULATORY_CARE_PROVIDER_SITE_OTHER): Payer: Medicare Other | Admitting: Internal Medicine

## 2020-08-05 ENCOUNTER — Ambulatory Visit: Payer: Medicare Other

## 2020-08-05 ENCOUNTER — Other Ambulatory Visit: Payer: Self-pay

## 2020-08-05 ENCOUNTER — Encounter: Payer: Self-pay | Admitting: Internal Medicine

## 2020-08-05 VITALS — BP 108/52 | HR 56 | Ht 63.0 in | Wt 133.1 lb

## 2020-08-05 DIAGNOSIS — I619 Nontraumatic intracerebral hemorrhage, unspecified: Secondary | ICD-10-CM

## 2020-08-05 DIAGNOSIS — R2681 Unsteadiness on feet: Secondary | ICD-10-CM

## 2020-08-05 DIAGNOSIS — R2689 Other abnormalities of gait and mobility: Secondary | ICD-10-CM | POA: Diagnosis not present

## 2020-08-05 DIAGNOSIS — E785 Hyperlipidemia, unspecified: Secondary | ICD-10-CM

## 2020-08-05 DIAGNOSIS — I10 Essential (primary) hypertension: Secondary | ICD-10-CM

## 2020-08-05 DIAGNOSIS — I480 Paroxysmal atrial fibrillation: Secondary | ICD-10-CM | POA: Diagnosis not present

## 2020-08-05 DIAGNOSIS — I5032 Chronic diastolic (congestive) heart failure: Secondary | ICD-10-CM | POA: Diagnosis not present

## 2020-08-05 DIAGNOSIS — M6281 Muscle weakness (generalized): Secondary | ICD-10-CM | POA: Diagnosis not present

## 2020-08-05 NOTE — Patient Instructions (Addendum)
Please let us know if you have worsening lightheadedness or your blood pressure is running less than 100/50.  Medication Instructions:  Your physician recommends that you continue on your current medications as directed. Please refer to the Current Medication list given to you today.  *If you need a refill on your cardiac medications before your next appointment, please call your pharmacy*   Lab Work: none If you have labs (blood work) drawn today and your tests are completely normal, you will receive your results only by: [name] MyChart Message (if you have MyChart) OR . A paper copy in the mail If you have any lab test that is abnormal or we need to change your treatment, we will call you to review the results.   Testing/Procedures: none  Follow-Up:  You have been referred to Dr [name] for evaluation.    At [facility], you and your health needs are our priority.  As part of our continuing mission to provide you with exceptional heart care, we have created designated Provider Care Teams.  These Care Teams include your primary Cardiologist (physician) and Advanced Practice Providers (APPs -  Physician Assistants and Nurse Practitioners) who all work together to provide you with the care you need, when you need it.  We recommend signing up for the patient portal called "MyChart".  Sign up information is provided on this After Visit Summary.  MyChart is used to connect with patients for Virtual Visits (Telemedicine).  Patients are able to view lab/test results, encounter notes, upcoming appointments, etc.  Non-urgent messages can be sent to your provider as well.   To learn more about what you can do with MyChart, go to [email or web address].    Your next appointment:   3 month(s)  The format for your next appointment:   In Person  Provider:   You may see Nelva Bush, MD or one of the following Advanced Practice Providers on your designated Care Team:    Murray Hodgkins,  NP  Christell Faith, PA-C  Marrianne Mood, PA-C  Cadence [place], [place]  Laurann [place], NP

## 2020-08-05 NOTE — Therapy (Signed)
[place] MAIN [facility] SERVICES [address] [place], [place], [address] Phone: [phone]   Fax:  [phone]  Physical Therapy Treatment  Patient Details  Name: Sandra [name] MRN: [id number] Date of Birth: December 20, 1945 Referring Provider (PT): Jerrol Banana., MD   Encounter Date: 08/05/2020   PT End of Session - 08/05/20 1306    Visit Number 3    Number of Visits 25    Date for PT Re-Evaluation 10/20/20    Authorization Type eval performed 01/29/8181 (recert for 9/93/7169)    Progress Note Due on Visit 10    PT Start Time 1300    PT Stop Time 1342    PT Time Calculation (min) 42 min    Equipment Utilized During Treatment Gait belt    Activity Tolerance Patient tolerated treatment well    Behavior During Therapy [facility] for tasks assessed/performed           Past Medical History:  Diagnosis Date  . (HFpEF) heart failure with preserved ejection fraction ([place])    a. 08/2019 Echo: EF 60-65%, no rwma, mild LVH, Gr2 DD, nl RV fxn. Nl pASP. Mildly dil LA. Mild to mod MR.  [name] Anemia   . Anxiety   . Arthritis   . BRCA gene mutation negative 10/2016   NEGATIVE: [employer]  . Breast cancer ([place]) 10/16/2016   T1c,N0;, GRADE I/III, 1.6cm. ER/PR pos  HER2 not over expressed, Right Upper Outer  . Celiac disease   . Depression   . Dyspnea    WITH EXERTION  . GERD (gastroesophageal reflux disease)    OCC  . Glaucoma    right eye  . Headache    H/O MIGRAINES AS TEENAGER  . Heart murmur    a. 08/2019 Echo: mild to mod MR.  [name] Hypertension   . Hypothyroidism   . Non-obstructive CAD (coronary artery disease)    a. 01/2017 MV: Hypertensive response. No ischemia/infarct. EF >65%; b. 09/2019 Cor CTA: Ca2+ = 9.29 (36th percentile). LAD calcified plaque (0-24%), otw nl. Multipel bilat pulm nodules up to 20m.  .Marland KitchenPAF (paroxysmal atrial fibrillation) ([place]    a. 02/2020 Zio: predominantly RSR, 65 (50-105), rare PACs/PVCs, 8 beats NSVT, multiple episodes  of PAF lasting up to 1hr 274ms. Avg AF rate 130 (93-170). AF burden <1%. Triggered events = RSR, PACs, and PAF; b. CHA2DS2VASc = 6.  . Pneumonia    YEARS AGO  . Pulmonary nodules    a. 09/2019 Cor CTA: incidental finding of multiple bilat pulm nodules; b. 12/2019 High Res CT: stable, scattered solid pulm nodules.  . Raynaud's disease   . RLS (restless legs syndrome)     Past Surgical History:  Procedure Laterality Date  . ABDOMINAL HYSTERECTOMY    . APPENDECTOMY    . AXILLARY LYMPH NODE BIOPSY Right 03/05/2017   Procedure: AXILLARY LYMPH NODE BIOPSY;  Surgeon: ByRobert BellowMD;  Location: [facility] ORS;  Service: General;  Laterality: Right;  . CATARACT EXTRACTION Bilateral   . CESAREAN SECTION    . COLONOSCOPY WITH PROPOFOL N/A 05/19/2015   Procedure: COLONOSCOPY WITH PROPOFOL;  Surgeon: RoManya SilvasMD;  [facility];  Service: Endoscopy;  Laterality: N/A;  . COLONOSCOPY WITH PROPOFOL N/A 08/01/2019   Procedure: COLONOSCOPY WITH PROPOFOL;  Surgeon: WoLucilla LameMD;  [facility];  Service: Endoscopy;  Laterality: N/A;  . EYE SURGERY    . HAND SURGERY Left   . MASTECTOMY Right 11/07/2016   RESIDUAL  INVASIVE MAMMARY CARCINOMA, SUBAREOLAR ANTERIOR TO PREVIOUS   . SENTINEL NODE BIOPSY Right 11/07/2016   Procedure: SENTINEL NODE BIOPSY;  Surgeon: Robert Bellow, MD;  Location: [facility] ORS;  Service: General;  Laterality: Right;  . SIMPLE MASTECTOMY WITH AXILLARY SENTINEL NODE BIOPSY Right 11/07/2016   6 mm ER/PR 100%; Her 2 neu not overexpressed, T1b, N0.  Surgeon: Robert Bellow, MD;  Location: [facility] ORS;  Service: General;  Laterality: Right;  . TONSILLECTOMY  AGE 81  . UPPER GI ENDOSCOPY  01/12/06   hiatus hernia    There were no vitals filed for this visit.   Subjective Assessment - 08/05/20 1305    Subjective Patient reprots she has been busy with Cardiology and Neurology in past 2 days.    Patient is accompained by: --   friend   Pertinent History  Pt is a 75 y/o female who presented to ED on 07/15/2020 due to dizziness where she was diagnosed with a hemorrhagic CVA in L cerebellar dentate nucleus. Pt pertinent PMH includes anemia, heart failure, paroxysmal atrial fibrillation, GERD, breast CA (2018), depression, celiac disease, CAD, restless leg syndrome, hypertension and hypothyroidism. Pt was using a walker upon d/c but reports she has discontinued use of RW.    Limitations Standing;Walking;Lifting;House hold activities    Diagnostic tests Relevant imaging: CT HEAD WO CONTRAST performed 07/15/2020 per chart states: "IMPRESSION:  Focal area of hemorrhage with adjacent edema in the left cerebellar dentate nucleus. Suspect acute hemorrhagic infarct in this area. Elsewhere there is mild atrophy with patchy periventricular small vessel disease. There are foci of arterial vascular calcification. There is mucosal thickening in several ethmoid air cells." 07/28/2020 CT HEAD WO CONTRAST per chart: "IMPRESSION: Resolving left cerebellar intraparenchymal hematoma. No interval  hemorrhage. No associated abnormal mass effect."    Patient Stated Goals PT and pt discussed improving pt's balance    Currently in Pain? No/denies           Vitals:              BP: 118/48 mm Hg             HR: 59 BPM             SPO2: 100%    Neuro Re-Ed:             Alternating foot taps on 4" step. 1x30 sec with no weight. Noted decreased proprioception of LE's with diffculty with feet landnig onto step. 2x30 sec, no UE support and 2.5 # AW's for proprioceptive feedback on LE's. Improved carryover after additions of AW's. Performed in  // bars  Step up onto 4" step x 15 reps each LE with no UE- small sway to left initially yet improved with practice. Side side over orange hurdle x 15 reps left to right and back. Forward/backward step over orange hurdle x15 Static stand on blue airex pad - feet apart then narrowed x 20 sec x 2 trials.  Dynamic lateral weight shift on  blue airex pad  Ant/post weight shift on blue airex pad Dynamic hip march (1-2 sec hold with no UE support) on blue airex pad to incorporate more balance x 10 reps Staggered stance on blue airex  - hold 30 sec with mild unsteadiness yet more with eyes closed and ant/post weight shift.  For HEP: Instructed in static and dynamic standing:  Feet apart with initially eyes open- no issued - progressed to eyes closed with marked more sway- x 20 sec x  3  Feet narrowed with eyes open then closed x 20 sec x 3 Feet staggered with eyes open then closed x 20 sec x 3.  Instructed to attempt first with eyes open for all foot positions at home then to progress to eyes closed. Patient and husband verbalized understanding. She declined need for handout.      CGA+1 throughout therapy session. Pt displays intermittent unsteadiness - More with eyes closed or use of airex pad yet no LOB during session.                     PT Education - 08/06/20 0853    Education Details specific static and dynamic balance activities.    Person(s) Educated Patient    Methods Explanation;Demonstration;Tactile cues;Verbal cues    Comprehension Verbalized understanding;Tactile cues required;Returned demonstration;Need further instruction;Verbal cues required            PT Short Term Goals - 07/28/20 1700      PT SHORT TERM GOAL #1   Title Patient will be independent in home exercise program to improve strength/mobility for better functional independence with ADLs.    Baseline 07/28/2020 Eval completed. HEP to be initiated next session    Time 6    Period Weeks    Status New    Target Date 09/08/20             PT Long Term Goals - 08/03/20 1040      PT LONG TERM GOAL #1   Title Patient will increase Functional Gait Assessment score to >24/30 as to reduce fall risk and demonstrate meaningful improvement in dynamic gait safety with community ambulation.    Baseline 07/28/2020 Pt FGA score 20/30     Time 12    Period Weeks    Status New      PT LONG TERM GOAL #2   Title Patient will increase Berg Balance score by > 6 points to demonstrate decreased fall risk during functional activities.    Baseline 07/28/2020 BERG score 45/56    Time 12    Period Weeks    Status New      PT LONG TERM GOAL #3   Title Pt will demonstrate improvement in B hip flexion and B ankle dorsiflexion by at least 1/2 point on MMT to indicate improvements in functional mobility.    Baseline 07/28/2020 Hip flexors 4/5 B, ankle dorsiflexors 4+/5 B    Time 12    Period Weeks    Status New      PT LONG TERM GOAL #4   Title Pt will demonstrate ability to maintain SLB for at least 5 seconds on B LES in order to improve safety with navigating over and around objects.    Baseline 07/28/2020 Pt unable to perform, requires assistance to prevent fall when she attempts    Time 12    Period Weeks    Status New      PT LONG TERM GOAL #5   Title Pt will improve FOTO to target score to display perceived improvements in ability to complete ADL's.    Baseline 3/8: 56/71    Time 12    Period Weeks    Status New    Target Date 10/20/20                 Plan - 08/05/20 1308    Clinical Impression Statement Patient continues to perform well with instruction in static and dynamic balance activities today. She was challenged  some with eyes closed and more dynamic activity and instructed in home program with husband present. She deined need for handout today stating she and her her husband could remember the instructed activities Pt will continue to benefit from skilled PT to improve dynamic balance to reduce risk of falls.    Personal Factors and Comorbidities Age;Comorbidity 3+;Sex;Comorbidity 1;Comorbidity 2;Transportation    Comorbidities other pertinent comorbidities include anemia, heart failure, paroxysmal atrial fibrillation, GERD, breast CA (2018), depression, celiac disease, CAD, restless leg syndrome, hypertension  and hypothyroidism.    Examination-Activity Limitations Lift;Locomotion Level;Carry;Stand;Stairs;Bathing;Reach Overhead;Squat;Bend    Examination-Participation Restrictions Medication Management;Community Activity;Driving;Shop;Yard Work;Laundry;Cleaning    Stability/Clinical Decision Making Stable/Uncomplicated    Rehab Potential Good    PT Frequency 2x / week    PT Duration 12 weeks    PT Treatment/Interventions ADLs/Self Care Home Management;Canalith Repostioning;Cryotherapy;Electrical Stimulation;Moist Heat;Traction;Ultrasound;DME Instruction;Gait training;Stair training;Functional mobility training;Therapeutic activities;Therapeutic exercise;Balance training;Neuromuscular re-education;Patient/family education;Orthotic Fit/Training;Manual techniques;Passive range of motion;Vestibular;Visual/perceptual remediation/compensation;Joint Manipulations    PT Next Visit Plan Dynamic balance    PT Home Exercise Plan Issued instruction in static/dynamic exercises- feet apart, together, staggered and progress to eyes closed as able. Patient verbalized and demonstrated good understanding.    Consulted and Agree with Plan of Care Patient;Other (Comment)   Pt with friend for eval          Patient will benefit from skilled therapeutic intervention in order to improve the following deficits and impairments:  Abnormal gait,Decreased balance,Difficulty walking,Dizziness,Improper body mechanics,Decreased coordination,Decreased activity tolerance,Postural dysfunction,Impaired vision/preception,Decreased mobility,Decreased range of motion,Decreased strength,Impaired flexibility,Hypomobility  Visit Diagnosis: Unsteadiness on feet  Muscle weakness (generalized)  Other abnormalities of gait and mobility     Problem List Patient Active Problem List   Diagnosis Date Noted  . Cerebellar hemorrhage, acute ([place]) 07/17/2020  . Hemorrhagic cerebrovascular accident (CVA) ([place]) 07/16/2020  . PAF (paroxysmal  atrial fibrillation) ([place])   . Personal history of colonic polyps   . Chronic diastolic heart failure ([place]) 05/17/2017  . Essential hypertension 05/17/2017  . Glaucoma (increased eye pressure) 02/06/2017  . Invasive ductal carcinoma of breast, female, right ([place]) 10/20/2016  . Prolapse of vaginal vault after hysterectomy 08/24/2016  . Vaginal atrophy 08/24/2016  . Dyspareunia in female 08/24/2016  . Colon polyps 01/06/2015  . BCC (basal cell carcinoma of skin) 01/06/2015  . Hypothyroidism 01/06/2015  . Vitamin D deficiency 01/06/2015  . Hyperlipidemia 01/06/2015  . Depression, major 01/06/2015  . GAD (generalized anxiety disorder) 01/06/2015  . RLS (restless legs syndrome) 01/06/2015  . Cataract 01/06/2015  . Raynaud phenomenon 01/06/2015  . GERD (gastroesophageal reflux disease) 01/06/2015  . Celiac disease 01/06/2015  . OA (osteoarthritis) 01/06/2015  . Osteoporosis 01/06/2015  . Pre-diabetes 01/06/2015  . Avitaminosis D 01/06/2015  . Raynaud's syndrome without gangrene 01/06/2015  . Age-related osteoporosis without current pathological fracture 01/06/2015  . Anxiety disorder 01/05/2015    Lewis Moccasin, PT 08/06/2020, 9:07 AM  [place] MAIN [facility] SERVICES [address] [place], [place], [address] Phone: [phone]   Fax:  [phone]  Name: SHERRIE MARSAN MRN: [id number] Date of Birth: [birth year]

## 2020-08-10 ENCOUNTER — Encounter: Payer: Self-pay | Admitting: Physical Therapy

## 2020-08-10 ENCOUNTER — Other Ambulatory Visit: Payer: Self-pay

## 2020-08-10 ENCOUNTER — Ambulatory Visit: Payer: Medicare Other

## 2020-08-10 DIAGNOSIS — R2681 Unsteadiness on feet: Secondary | ICD-10-CM

## 2020-08-10 DIAGNOSIS — M6281 Muscle weakness (generalized): Secondary | ICD-10-CM | POA: Diagnosis not present

## 2020-08-10 DIAGNOSIS — R2689 Other abnormalities of gait and mobility: Secondary | ICD-10-CM

## 2020-08-10 NOTE — Therapy (Signed)
[place] [facility] SERVICES [address] [place], [place], [address] Phone: [phone]   Fax:  [phone]  Physical Therapy Treatment  Patient Details  Name: Sandra [name] MRN: [id number] Date of Birth: 1945/08/05 Referring Provider (PT): Jerrol Banana., MD   Encounter Date: 08/10/2020   PT End of Session - 08/10/20 1101    Visit Number 4    Number of Visits 25    Date for PT Re-Evaluation 10/20/20    Authorization Type eval performed 07/03/971 (recert for 5/32/9924)    Progress Note Due on Visit 10    PT Start Time 1100    PT Stop Time 1143    PT Time Calculation (min) 43 min    Equipment Utilized During Treatment Gait belt    Activity Tolerance Patient tolerated treatment well    Behavior During Therapy [facility] for tasks assessed/performed           Past Medical History:  Diagnosis Date  . (HFpEF) heart failure with preserved ejection fraction ([place])    a. 08/2019 Echo: EF 60-65%, no rwma, mild LVH, Gr2 DD, nl RV fxn. Nl pASP. Mildly dil LA. Mild to mod MR.  [name] Anemia   . Anxiety   . Arthritis   . BRCA gene mutation negative 10/2016   NEGATIVE: [employer]  . Breast cancer ([place]) 10/16/2016   T1c,N0;, GRADE I/III, 1.6cm. ER/PR pos  HER2 not over expressed, Right Upper Outer  . Celiac disease   . Depression   . Dyspnea    WITH EXERTION  . GERD (gastroesophageal reflux disease)    OCC  . Glaucoma    right eye  . Headache    H/O MIGRAINES AS TEENAGER  . Heart murmur    a. 08/2019 Echo: mild to mod MR.  [name] Hypertension   . Hypothyroidism   . Non-obstructive CAD (coronary artery disease)    a. 01/2017 MV: Hypertensive response. No ischemia/infarct. EF >65%; b. 09/2019 Cor CTA: Ca2+ = 9.29 (36th percentile). LAD calcified plaque (0-24%), otw nl. Multipel bilat pulm nodules up to 63m.  .Marland KitchenPAF (paroxysmal atrial fibrillation) ([place]    a. 02/2020 Zio: predominantly RSR, 65 (50-105), rare PACs/PVCs, 8 beats NSVT, multiple episodes  of PAF lasting up to 1hr 2104ms. Avg AF rate 130 (93-170). AF burden <1%. Triggered events = RSR, PACs, and PAF; b. CHA2DS2VASc = 6.  . Pneumonia    YEARS AGO  . Pulmonary nodules    a. 09/2019 Cor CTA: incidental finding of multiple bilat pulm nodules; b. 12/2019 High Res CT: stable, scattered solid pulm nodules.  . Raynaud's disease   . RLS (restless legs syndrome)     Past Surgical History:  Procedure Laterality Date  . ABDOMINAL HYSTERECTOMY    . APPENDECTOMY    . AXILLARY LYMPH NODE BIOPSY Right 03/05/2017   Procedure: AXILLARY LYMPH NODE BIOPSY;  Surgeon: ByRobert BellowMD;  Location: [facility] ORS;  Service: General;  Laterality: Right;  . CATARACT EXTRACTION Bilateral   . CESAREAN SECTION    . COLONOSCOPY WITH PROPOFOL N/A 05/19/2015   Procedure: COLONOSCOPY WITH PROPOFOL;  Surgeon: RoManya SilvasMD;  [facility];  Service: Endoscopy;  Laterality: N/A;  . COLONOSCOPY WITH PROPOFOL N/A 08/01/2019   Procedure: COLONOSCOPY WITH PROPOFOL;  Surgeon: WoLucilla LameMD;  [facility];  Service: Endoscopy;  Laterality: N/A;  . EYE SURGERY    . HAND SURGERY Left   . MASTECTOMY Right 11/07/2016   RESIDUAL  INVASIVE MAMMARY CARCINOMA, SUBAREOLAR ANTERIOR TO PREVIOUS   . SENTINEL NODE BIOPSY Right 11/07/2016   Procedure: SENTINEL NODE BIOPSY;  Surgeon: Robert Bellow, MD;  Location: [facility] ORS;  Service: General;  Laterality: Right;  . SIMPLE MASTECTOMY WITH AXILLARY SENTINEL NODE BIOPSY Right 11/07/2016   6 mm ER/PR 100%; Her 2 neu not overexpressed, T1b, N0.  Surgeon: Robert Bellow, MD;  Location: [facility] ORS;  Service: General;  Laterality: Right;  . TONSILLECTOMY  AGE 25  . UPPER GI ENDOSCOPY  01/12/06   hiatus hernia    There were no vitals filed for this visit.   Subjective Assessment - 08/10/20 1101    Subjective Pt reports her appointments went well last week. No stumbles or falls.    Patient is accompained by: --   friend   Pertinent History Pt is a  75 y/o female who presented to ED on 07/15/2020 due to dizziness where she was diagnosed with a hemorrhagic CVA in L cerebellar dentate nucleus. Pt pertinent PMH includes anemia, heart failure, paroxysmal atrial fibrillation, GERD, breast CA (2018), depression, celiac disease, CAD, restless leg syndrome, hypertension and hypothyroidism. Pt was using a walker upon d/c but reports she has discontinued use of RW.    Limitations Standing;Walking;Lifting;House hold activities    Diagnostic tests Relevant imaging: CT HEAD WO CONTRAST performed 07/15/2020 per chart states: "IMPRESSION:  Focal area of hemorrhage with adjacent edema in the left cerebellar dentate nucleus. Suspect acute hemorrhagic infarct in this area. Elsewhere there is mild atrophy with patchy periventricular small vessel disease. There are foci of arterial vascular calcification. There is mucosal thickening in several ethmoid air cells." 07/28/2020 CT HEAD WO CONTRAST per chart: "IMPRESSION: Resolving left cerebellar intraparenchymal hematoma. No interval  hemorrhage. No associated abnormal mass effect."    Patient Stated Goals PT and pt discussed improving pt's balance    Currently in Pain? No/denies    Pain Score 0-No pain           Vitals:   BP: 107/53 mm Hg  HR: 58 BPM   SPO2: 98%  Neuro Re-Ed:   Ambulation in hallway 100 ft    Vertical/horizontal head turns: x2/direction   Progressed vertical/horizontal head turns with dual tasking counting multiples of 2. X2/direction. Veering right with horizontal head turns that progressed with added dual task. Intermittent slowing in gait speed and variable step lengths and sway pt able to correct independently. CGA   Ambulating with alternating cones taps forwards: x4. Difficulty with stance on LLE > RLE and coordinating the alternating foot taps with walking. Adjusted cones closer together with improvement in coordination and good carryover. CGA   Cone weaves (10'): x2, no LOB or difficulty  noted. CGA   Alternating cone taps and weaving around cones in frontal plane for coordination and dynamic balance/hip stability. VC/TC for form/technique with exercise. Continued difficulty with coordinating steps. CGA on gait belt and shoulder for safety.  KORE Balance    Maze x3: progressed from BUE support to SUE to no UE support. Good ability to perform dynamic weight shifts outside BOS with no UE's and no LOB.   VC/TC for form/technique. Pt displayed most difficulty with dual tasking and coordination tasks requiring intermittent therapist assist to correct LOB.     PT Education - 08/10/20 1145    Education Details form/technique with exercise.    Person(s) Educated Patient    Methods Explanation;Demonstration;Tactile cues;Verbal cues    Comprehension Verbalized understanding;Returned demonstration;Verbal cues required;Tactile cues required  PT Short Term Goals - 07/28/20 1700      PT SHORT TERM GOAL #1   Title Patient will be independent in home exercise program to improve strength/mobility for better functional independence with ADLs.    Baseline 07/28/2020 Eval completed. HEP to be initiated next session    Time 6    Period Weeks    Status New    Target Date 09/08/20             PT Long Term Goals - 08/03/20 1040      PT LONG TERM GOAL #1   Title Patient will increase Functional Gait Assessment score to >24/30 as to reduce fall risk and demonstrate meaningful improvement in dynamic gait safety with community ambulation.    Baseline 07/28/2020 Pt FGA score 20/30    Time 12    Period Weeks    Status New      PT LONG TERM GOAL #2   Title Patient will increase Berg Balance score by > 6 points to demonstrate decreased fall risk during functional activities.    Baseline 07/28/2020 BERG score 45/56    Time 12    Period Weeks    Status New      PT LONG TERM GOAL #3   Title Pt will demonstrate improvement in B hip flexion and B ankle dorsiflexion by at least 1/2  point on MMT to indicate improvements in functional mobility.    Baseline 07/28/2020 Hip flexors 4/5 B, ankle dorsiflexors 4+/5 B    Time 12    Period Weeks    Status New      PT LONG TERM GOAL #4   Title Pt will demonstrate ability to maintain SLB for at least 5 seconds on B LES in order to improve safety with navigating over and around objects.    Baseline 07/28/2020 Pt unable to perform, requires assistance to prevent fall when she attempts    Time 12    Period Weeks    Status New      PT LONG TERM GOAL #5   Title Pt will improve FOTO to target score to display perceived improvements in ability to complete ADL's.    Baseline 3/8: 56/71    Time 12    Period Weeks    Status New    Target Date 10/20/20                 Plan - 08/10/20 1146    Clinical Impression Statement Pt continues to remain extremely motivated in therapy. Focus of session on dynamic balance to challenge dual tasking and coordination. Pt displayed most difficulty with coordinating cone taps with intermittent lat sway and steppage pattern to correct LOB with leaning on PT. Use of KORE balance machine to improve ankle and hip strategy with weightshifts with good form/technique with no hands with no LOB. Pt can continue to benefit from further skilled PT services to progress balance and coordination to reduce risk of falls.    Personal Factors and Comorbidities Age;Comorbidity 3+;Sex;Comorbidity 1;Comorbidity 2;Transportation    Comorbidities other pertinent comorbidities include anemia, heart failure, paroxysmal atrial fibrillation, GERD, breast CA (2018), depression, celiac disease, CAD, restless leg syndrome, hypertension and hypothyroidism.    Examination-Activity Limitations Lift;Locomotion Level;Carry;Stand;Stairs;Bathing;Reach Overhead;Squat;Bend    Examination-Participation Restrictions Medication Management;Community Activity;Driving;Shop;Yard Work;Laundry;Cleaning    Stability/Clinical Decision Making  Stable/Uncomplicated    Rehab Potential Good    PT Frequency 2x / week    PT Duration 12 weeks    PT Treatment/Interventions  ADLs/Self Care Home Management;Canalith Repostioning;Cryotherapy;Electrical Stimulation;Moist Heat;Traction;Ultrasound;DME Instruction;Gait training;Stair training;Functional mobility training;Therapeutic activities;Therapeutic exercise;Balance training;Neuromuscular re-education;Patient/family education;Orthotic Fit/Training;Manual techniques;Passive range of motion;Vestibular;Visual/perceptual remediation/compensation;Joint Manipulations    PT Next Visit Plan Coordination, dynamic balance    PT Home Exercise Plan Issued instruction in static/dynamic exercises- feet apart, together, staggered and progress to eyes closed as able. Patient verbalized and demonstrated good understanding.    Consulted and Agree with Plan of Care Patient;Other (Comment)   Pt with friend for eval          Patient will benefit from skilled therapeutic intervention in order to improve the following deficits and impairments:  Abnormal gait,Decreased balance,Difficulty walking,Dizziness,Improper body mechanics,Decreased coordination,Decreased activity tolerance,Postural dysfunction,Impaired vision/preception,Decreased mobility,Decreased range of motion,Decreased strength,Impaired flexibility,Hypomobility  Visit Diagnosis: Unsteadiness on feet  Muscle weakness (generalized)  Other abnormalities of gait and mobility     Problem List Patient Active Problem List   Diagnosis Date Noted  . Cerebellar hemorrhage, acute ([place]) 07/17/2020  . Hemorrhagic cerebrovascular accident (CVA) ([place]) 07/16/2020  . PAF (paroxysmal atrial fibrillation) ([place])   . Personal history of colonic polyps   . Chronic diastolic heart failure ([place]) 05/17/2017  . Essential hypertension 05/17/2017  . Glaucoma (increased eye pressure) 02/06/2017  . Invasive ductal carcinoma of breast, female, right ([place]) 10/20/2016  .  Prolapse of vaginal vault after hysterectomy 08/24/2016  . Vaginal atrophy 08/24/2016  . Dyspareunia in female 08/24/2016  . Colon polyps 01/06/2015  . BCC (basal cell carcinoma of skin) 01/06/2015  . Hypothyroidism 01/06/2015  . Vitamin D deficiency 01/06/2015  . Hyperlipidemia 01/06/2015  . Depression, major 01/06/2015  . GAD (generalized anxiety disorder) 01/06/2015  . RLS (restless legs syndrome) 01/06/2015  . Cataract 01/06/2015  . Raynaud phenomenon 01/06/2015  . GERD (gastroesophageal reflux disease) 01/06/2015  . Celiac disease 01/06/2015  . OA (osteoarthritis) 01/06/2015  . Osteoporosis 01/06/2015  . Pre-diabetes 01/06/2015  . Avitaminosis D 01/06/2015  . Raynaud's syndrome without gangrene 01/06/2015  . Age-related osteoporosis without current pathological fracture 01/06/2015  . Anxiety disorder 01/05/2015    Salem Caster. Fairly IV, PT, DPT Physical Therapist- [facility]  08/10/2020, 11:59 AM  [place] MAIN [facility] SERVICES [address] [place], [place], [address] Phone: [phone]   Fax:  [phone]  Name: Sandra [name] MRN: [id number] Date of Birth: Dec 01, 1945

## 2020-08-12 ENCOUNTER — Ambulatory Visit: Payer: Medicare Other

## 2020-08-12 ENCOUNTER — Other Ambulatory Visit: Payer: Self-pay

## 2020-08-12 DIAGNOSIS — R2689 Other abnormalities of gait and mobility: Secondary | ICD-10-CM | POA: Diagnosis not present

## 2020-08-12 DIAGNOSIS — R2681 Unsteadiness on feet: Secondary | ICD-10-CM | POA: Diagnosis not present

## 2020-08-12 DIAGNOSIS — M6281 Muscle weakness (generalized): Secondary | ICD-10-CM

## 2020-08-12 NOTE — Therapy (Signed)
[place] MAIN [facility] SERVICES [address] [place], [place], [address] Phone: [phone]   Fax:  [phone]  Physical Therapy Treatment  Patient Details  Name: Sandra [name] MRN: [id number] Date of Birth: 26-Apr-1946 Referring Provider (PT): Jerrol Banana., MD   Encounter Date: 08/12/2020   PT End of Session - 08/12/20 1445    Visit Number 5    Number of Visits 25    Date for PT Re-Evaluation 10/20/20    Authorization Type eval performed 11/29/4191 (recert for 7/90/2409)    Progress Note Due on Visit 10    PT Start Time 1431    PT Stop Time 1514    PT Time Calculation (min) 43 min    Equipment Utilized During Treatment Gait belt    Activity Tolerance Patient tolerated treatment well    Behavior During Therapy [facility] for tasks assessed/performed           Past Medical History:  Diagnosis Date  . (HFpEF) heart failure with preserved ejection fraction ([place])    a. 08/2019 Echo: EF 60-65%, no rwma, mild LVH, Gr2 DD, nl RV fxn. Nl pASP. Mildly dil LA. Mild to mod MR.  [name] Anemia   . Anxiety   . Arthritis   . BRCA gene mutation negative 10/2016   NEGATIVE: [employer]  . Breast cancer ([place]) 10/16/2016   T1c,N0;, GRADE I/III, 1.6cm. ER/PR pos  HER2 not over expressed, Right Upper Outer  . Celiac disease   . Depression   . Dyspnea    WITH EXERTION  . GERD (gastroesophageal reflux disease)    OCC  . Glaucoma    right eye  . Headache    H/O MIGRAINES AS TEENAGER  . Heart murmur    a. 08/2019 Echo: mild to mod MR.  [name] Hypertension   . Hypothyroidism   . Non-obstructive CAD (coronary artery disease)    a. 01/2017 MV: Hypertensive response. No ischemia/infarct. EF >65%; b. 09/2019 Cor CTA: Ca2+ = 9.29 (36th percentile). LAD calcified plaque (0-24%), otw nl. Multipel bilat pulm nodules up to 78m.  .Marland KitchenPAF (paroxysmal atrial fibrillation) ([place]    a. 02/2020 Zio: predominantly RSR, 65 (50-105), rare PACs/PVCs, 8 beats NSVT, multiple episodes  of PAF lasting up to 1hr 24ms. Avg AF rate 130 (93-170). AF burden <1%. Triggered events = RSR, PACs, and PAF; b. CHA2DS2VASc = 6.  . Pneumonia    YEARS AGO  . Pulmonary nodules    a. 09/2019 Cor CTA: incidental finding of multiple bilat pulm nodules; b. 12/2019 High Res CT: stable, scattered solid pulm nodules.  . Raynaud's disease   . RLS (restless legs syndrome)     Past Surgical History:  Procedure Laterality Date  . ABDOMINAL HYSTERECTOMY    . APPENDECTOMY    . AXILLARY LYMPH NODE BIOPSY Right 03/05/2017   Procedure: AXILLARY LYMPH NODE BIOPSY;  Surgeon: ByRobert BellowMD;  Location: [facility] ORS;  Service: General;  Laterality: Right;  . CATARACT EXTRACTION Bilateral   . CESAREAN SECTION    . COLONOSCOPY WITH PROPOFOL N/A 05/19/2015   Procedure: COLONOSCOPY WITH PROPOFOL;  Surgeon: RoManya SilvasMD;  [facility];  Service: Endoscopy;  Laterality: N/A;  . COLONOSCOPY WITH PROPOFOL N/A 08/01/2019   Procedure: COLONOSCOPY WITH PROPOFOL;  Surgeon: WoLucilla LameMD;  [facility];  Service: Endoscopy;  Laterality: N/A;  . EYE SURGERY    . HAND SURGERY Left   . MASTECTOMY Right 11/07/2016   RESIDUAL  INVASIVE MAMMARY CARCINOMA, SUBAREOLAR ANTERIOR TO PREVIOUS   . SENTINEL NODE BIOPSY Right 11/07/2016   Procedure: SENTINEL NODE BIOPSY;  Surgeon: Robert Bellow, MD;  Location: [facility] ORS;  Service: General;  Laterality: Right;  . SIMPLE MASTECTOMY WITH AXILLARY SENTINEL NODE BIOPSY Right 11/07/2016   6 mm ER/PR 100%; Her 2 neu not overexpressed, T1b, N0.  Surgeon: Robert Bellow, MD;  Location: [facility] ORS;  Service: General;  Laterality: Right;  . TONSILLECTOMY  AGE 72  . UPPER GI ENDOSCOPY  01/12/06   hiatus hernia    There were no vitals filed for this visit.   Subjective Assessment - 08/12/20 1443    Subjective Pt reports new onset of intermittent dizziness and lightheadedness that occured today. No med changes and taking meds as prescibed from MD's.  Pt denies any memory loss, speech or visual changes, falls, LE/UE weakness, or syncopal episodes. Pt does report eating very little today and not drinking more than a glass of water to take her meds this a.m. No falls.    Patient is accompained by: Family member   friend   Pertinent History Pt is a 75 y/o female who presented to ED on 07/15/2020 due to dizziness where she was diagnosed with a hemorrhagic CVA in L cerebellar dentate nucleus. Pt pertinent PMH includes anemia, heart failure, paroxysmal atrial fibrillation, GERD, breast CA (2018), depression, celiac disease, CAD, restless leg syndrome, hypertension and hypothyroidism. Pt was using a walker upon d/c but reports she has discontinued use of RW.    Limitations Standing;Walking;Lifting;House hold activities    Diagnostic tests Relevant imaging: CT HEAD WO CONTRAST performed 07/15/2020 per chart states: "IMPRESSION:  Focal area of hemorrhage with adjacent edema in the left cerebellar dentate nucleus. Suspect acute hemorrhagic infarct in this area. Elsewhere there is mild atrophy with patchy periventricular small vessel disease. There are foci of arterial vascular calcification. There is mucosal thickening in several ethmoid air cells." 07/28/2020 CT HEAD WO CONTRAST per chart: "IMPRESSION: Resolving left cerebellar intraparenchymal hematoma. No interval  hemorrhage. No associated abnormal mass effect."    Patient Stated Goals PT and pt discussed improving pt's balance    Currently in Pain? No/denies    Pain Score 0-No pain         Vitals:   BP: 128/59 mm Hg  HR: 58 BPM  SPO2: 98%   Neuro Re-Ed: CGA provided throughout  Shooting basketful of basketballs standing on airex pad: x3 baskets (15-20 balls), progressing distance of hoop. Intermittent VC provided to perform with no UE support. Pt appears steady with dynamic forward motion outside BOS.   Alternating, walking cone taps to improve SLS and coordination. X6. Less lateral sway and improved  ability to stand on single limb with upright posture to tap cone. Only 2 bouts of LOB that pt able to correct independently.   Walking in hallway tandem stance: 2x80'. Lateral sway requiring intermittent stepping to correct LOB. VC to connect heel to toe to challenge balance further by reducing BOS.   Soccer ball passes against wall to challenge coordination, SLS, and frontal plane motions. 5 minutes.     PT Education - 08/12/20 1527    Education Details Contact physician if lightheadedness/dizziness does not improve. Contact emergency services if signs/symptoms of stroke become present.    Person(s) Educated Patient    Methods Explanation    Comprehension Verbalized understanding            PT Short Term Goals - 07/28/20 1700  PT SHORT TERM GOAL #1   Title Patient will be independent in home exercise program to improve strength/mobility for better functional independence with ADLs.    Baseline 07/28/2020 Eval completed. HEP to be initiated next session    Time 6    Period Weeks    Status New    Target Date 09/08/20             PT Long Term Goals - 08/03/20 1040      PT LONG TERM GOAL #1   Title Patient will increase Functional Gait Assessment score to >24/30 as to reduce fall risk and demonstrate meaningful improvement in dynamic gait safety with community ambulation.    Baseline 07/28/2020 Pt FGA score 20/30    Time 12    Period Weeks    Status New      PT LONG TERM GOAL #2   Title Patient will increase Berg Balance score by > 6 points to demonstrate decreased fall risk during functional activities.    Baseline 07/28/2020 BERG score 45/56    Time 12    Period Weeks    Status New      PT LONG TERM GOAL #3   Title Pt will demonstrate improvement in B hip flexion and B ankle dorsiflexion by at least 1/2 point on MMT to indicate improvements in functional mobility.    Baseline 07/28/2020 Hip flexors 4/5 B, ankle dorsiflexors 4+/5 B    Time 12    Period Weeks    Status New       PT LONG TERM GOAL #4   Title Pt will demonstrate ability to maintain SLB for at least 5 seconds on B LES in order to improve safety with navigating over and around objects.    Baseline 07/28/2020 Pt unable to perform, requires assistance to prevent fall when she attempts    Time 12    Period Weeks    Status New      PT LONG TERM GOAL #5   Title Pt will improve FOTO to target score to display perceived improvements in ability to complete ADL's.    Baseline 3/8: 56/71    Time 12    Period Weeks    Status New    Target Date 10/20/20                 Plan - 08/12/20 1528    Clinical Impression Statement Vitals assessed due to pt reports of dizizness/lightheadedness. Vitals overall appear relatively normal. Pt educated to contact doctor if she remains dizzy or lightheaded and it does not improve. Pt educated to contact EMS if stroke signs or symptoms occur or if experiencing any other abnormal symptoms like chest pain. Pt verbalized understanding. Light coordination and dynamic balance activities performed to pt tolerance due to reports of symptoms. Pt reports no changes from beginning to end of session. Pt can continue to benefit from further skilled PT services to reduce risk of falls.    Personal Factors and Comorbidities Age;Comorbidity 3+;Sex;Comorbidity 1;Comorbidity 2;Transportation    Comorbidities other pertinent comorbidities include anemia, heart failure, paroxysmal atrial fibrillation, GERD, breast CA (2018), depression, celiac disease, CAD, restless leg syndrome, hypertension and hypothyroidism.    Examination-Activity Limitations Lift;Locomotion Level;Carry;Stand;Stairs;Bathing;Reach Overhead;Squat;Bend    Examination-Participation Restrictions Medication Management;Community Activity;Driving;Shop;Yard Work;Laundry;Cleaning    Stability/Clinical Decision Making Stable/Uncomplicated    Rehab Potential Good    PT Frequency 2x / week    PT Duration 12 weeks    PT  Treatment/Interventions ADLs/Self Care Home  Management;Canalith Repostioning;Cryotherapy;Electrical Stimulation;Moist Heat;Traction;Ultrasound;DME Instruction;Gait training;Stair training;Functional mobility training;Therapeutic activities;Therapeutic exercise;Balance training;Neuromuscular re-education;Patient/family education;Orthotic Fit/Training;Manual techniques;Passive range of motion;Vestibular;Visual/perceptual remediation/compensation;Joint Manipulations    PT Next Visit Plan Reassess vitals. Coordination, dynamic balance    PT Home Exercise Plan Issued instruction in static/dynamic exercises- feet apart, together, staggered and progress to eyes closed as able. Patient verbalized and demonstrated good understanding.    Consulted and Agree with Plan of Care Patient;Other (Comment)   Pt with friend for eval          Patient will benefit from skilled therapeutic intervention in order to improve the following deficits and impairments:  Abnormal gait,Decreased balance,Difficulty walking,Dizziness,Improper body mechanics,Decreased coordination,Decreased activity tolerance,Postural dysfunction,Impaired vision/preception,Decreased mobility,Decreased range of motion,Decreased strength,Impaired flexibility,Hypomobility  Visit Diagnosis: Unsteadiness on feet  Muscle weakness (generalized)  Other abnormalities of gait and mobility     Problem List Patient Active Problem List   Diagnosis Date Noted  . Cerebellar hemorrhage, acute ([place]) 07/17/2020  . Hemorrhagic cerebrovascular accident (CVA) ([place]) 07/16/2020  . PAF (paroxysmal atrial fibrillation) ([place])   . Personal history of colonic polyps   . Chronic diastolic heart failure ([place]) 05/17/2017  . Essential hypertension 05/17/2017  . Glaucoma (increased eye pressure) 02/06/2017  . Invasive ductal carcinoma of breast, female, right ([place]) 10/20/2016  . Prolapse of vaginal vault after hysterectomy 08/24/2016  . Vaginal atrophy 08/24/2016  .  Dyspareunia in female 08/24/2016  . Colon polyps 01/06/2015  . BCC (basal cell carcinoma of skin) 01/06/2015  . Hypothyroidism 01/06/2015  . Vitamin D deficiency 01/06/2015  . Hyperlipidemia 01/06/2015  . Depression, major 01/06/2015  . GAD (generalized anxiety disorder) 01/06/2015  . RLS (restless legs syndrome) 01/06/2015  . Cataract 01/06/2015  . Raynaud phenomenon 01/06/2015  . GERD (gastroesophageal reflux disease) 01/06/2015  . Celiac disease 01/06/2015  . OA (osteoarthritis) 01/06/2015  . Osteoporosis 01/06/2015  . Pre-diabetes 01/06/2015  . Avitaminosis D 01/06/2015  . Raynaud's syndrome without gangrene 01/06/2015  . Age-related osteoporosis without current pathological fracture 01/06/2015  . Anxiety disorder 01/05/2015    Salem Caster. Fairly IV, PT, DPT Physical Therapist- [facility]  08/12/2020, 4:22 PM  [place] MAIN [facility] SERVICES [address] [place], [place], [address] Phone: [phone]   Fax:  [phone]  Name: Sandra [name] MRN: [id number] Date of Birth: 1945-07-17

## 2020-08-16 ENCOUNTER — Other Ambulatory Visit: Payer: Self-pay

## 2020-08-16 ENCOUNTER — Encounter: Payer: Self-pay | Admitting: Family Medicine

## 2020-08-16 ENCOUNTER — Ambulatory Visit (INDEPENDENT_AMBULATORY_CARE_PROVIDER_SITE_OTHER): Payer: Medicare Other | Admitting: Family Medicine

## 2020-08-16 VITALS — BP 119/68 | HR 60 | Temp 97.9°F | Resp 16 | Ht 63.0 in | Wt 139.0 lb

## 2020-08-16 DIAGNOSIS — F411 Generalized anxiety disorder: Secondary | ICD-10-CM | POA: Diagnosis not present

## 2020-08-16 DIAGNOSIS — I480 Paroxysmal atrial fibrillation: Secondary | ICD-10-CM | POA: Diagnosis not present

## 2020-08-16 DIAGNOSIS — Z853 Personal history of malignant neoplasm of breast: Secondary | ICD-10-CM

## 2020-08-16 DIAGNOSIS — E7849 Other hyperlipidemia: Secondary | ICD-10-CM

## 2020-08-16 DIAGNOSIS — I10 Essential (primary) hypertension: Secondary | ICD-10-CM | POA: Diagnosis not present

## 2020-08-16 DIAGNOSIS — I614 Nontraumatic intracerebral hemorrhage in cerebellum: Secondary | ICD-10-CM | POA: Diagnosis not present

## 2020-08-16 MED ORDER — AMLODIPINE BESYLATE 10 MG PO TABS
10.0000 mg | ORAL_TABLET | Freq: Every day | ORAL | 3 refills | Status: DC
Start: 2020-08-16 — End: 2020-10-17

## 2020-08-16 NOTE — Progress Notes (Signed)
Established patient visit   Patient: Sandra [name]   DOB: 05-19-1946   75 y.o. Female  MRN: [id number] Visit Date: 08/16/2020  Today's healthcare provider: [name], MD   Chief Complaint  Patient presents with  . Hypertension   Subjective    HPI  Since her last visit here the patient is seen cardiology and neurology she is on aspirin 81 mg today and has follow-up with cardiology for consideration of left atrial appendage closure with watchman device.  Also consideration of study of aspirin versus apixaban. Patient is doing much better.  She is moving well.  She is doing well with physical therapy.  She wants to start driving again.  She has some memory problems but no problems with reflexes and her husband states that he is always with her. Hypertension, follow-up  BP Readings from Last 3 Encounters:  08/16/20 119/68  08/05/20 (!) 108/52  08/04/20 (!) 116/59   Wt Readings from Last 3 Encounters:  08/16/20 139 lb (63 kg)  08/05/20 133 lb 2 oz (60.4 kg)  08/04/20 134 lb 6.4 oz (61 kg)     She was last seen for hypertension 1 months ago.  BP at that visit was 123/74. Management since that visit includes increase amlodipine from 5 to 24m daily.  She reports good compliance with treatment. She is not having side effects.  She is following a Regular diet. She is exercising. She does not smoke.   Use of agents associated with hypertension: none.   Outside blood pressures are checked daily. Patient reports that BP averages in the 120s/70s,  Symptoms: No chest pain No chest pressure  No palpitations No syncope  No dyspnea No orthopnea  No paroxysmal nocturnal dyspnea Yes lower extremity edema   Pertinent labs: Lab Results  Component Value Date   CHOL 137 07/17/2020   HDL 50 07/17/2020   LDLCALC 68 07/17/2020   TRIG 95 07/17/2020   CHOLHDL 2.7 07/17/2020   Lab Results  Component Value Date   NA 142 07/22/2020   K 4.1 07/22/2020   CREATININE 0.68  07/22/2020   GFRNONAA 86 07/22/2020   GFRAA 99 07/22/2020   GLUCOSE 100 (H) 07/22/2020     The 10-year ASCVD risk score (Mikey BussingDC Jr., et al., 2013) is: 17.8%        Medications: Outpatient Medications Prior to Visit  Medication Sig  . acetaminophen (TYLENOL) 500 MG tablet Take 500-1,000 mg by mouth every 6 (six) hours as needed for mild pain or moderate pain.  .Marland KitchenALPRAZolam (XANAX) 0.5 MG tablet TAKE 1 TABLET BY MOUTH AT  BEDTIME  . amLODipine (NORVASC) 10 MG tablet Take 1 tablet (10 mg total) by mouth daily.  .Marland Kitchenatorvastatin (LIPITOR) 40 MG tablet Take 40 mg by mouth daily.  . Calcium Carb-Cholecalciferol 500-400 MG-UNIT TABS Take 1-2 tablets by mouth daily.  . carvedilol (COREG) 6.25 MG tablet Take 1 tablet (6.25 mg total) by mouth 2 (two) times daily with a meal.  . Cholecalciferol (VITAMIN D3) 125 MCG (5000 UT) TABS Take 125 Units by mouth daily.  . hydroxypropyl methylcellulose / hypromellose (ISOPTO TEARS / GONIOVISC) 2.5 % ophthalmic solution Place 1 drop into both eyes 4 (four) times daily as needed for dry eyes.  .Marland Kitchenlatanoprost (XALATAN) 0.005 % ophthalmic solution Place 1 drop into both eyes at bedtime.  .Marland Kitchenletrozole (FEMARA) 2.5 MG tablet Take 1 tablet (2.5 mg total) by mouth daily.  .Marland Kitchenlevothyroxine (SYNTHROID) 100 MCG tablet  TAKE 1 TABLET BY MOUTH 30  MINUTES BEFORE BREAKFAST (Patient taking differently: TAKE 1 TABLET BY MOUTH 30  MINUTES BEFORE BREAKFAST)  . Multiple Vitamin (MULTIVITAMIN WITH MINERALS) TABS tablet Take 1 tablet by mouth daily at 3 pm.  . omeprazole (PRILOSEC) 40 MG capsule TAKE 1 CAPSULE BY MOUTH  DAILY  . sennosides-docusate sodium (SENOKOT-S) 8.6-50 MG tablet Take 1 tablet by mouth as needed for constipation.  . sertraline (ZOLOFT) 100 MG tablet TAKE 1 TABLET BY MOUTH  DAILY  . timolol (TIMOPTIC) 0.5 % ophthalmic solution Place 1 drop into both eyes every morning.   No facility-administered medications prior to visit.    Review of Systems   Constitutional: Negative for activity change and fatigue.  Respiratory: Negative for cough and shortness of breath.   Cardiovascular: Negative for chest pain, palpitations and leg swelling.  Neurological: Negative for dizziness, light-headedness and headaches.  Psychiatric/Behavioral: Negative for agitation, self-injury and sleep disturbance. The patient is not nervous/anxious.         Objective    BP 119/68   Pulse 60   Temp 97.9 F (36.6 C)   Resp 16   Ht 5' 3"  (1.6 m)   Wt 139 lb (63 kg)   BMI 24.62 kg/m  BP Readings from Last 3 Encounters:  08/16/20 119/68  08/05/20 (!) 108/52  08/04/20 (!) 116/59   Wt Readings from Last 3 Encounters:  08/16/20 139 lb (63 kg)  08/05/20 133 lb 2 oz (60.4 kg)  08/04/20 134 lb 6.4 oz (61 kg)       Physical Exam Vitals reviewed.  Constitutional:      Appearance: Normal appearance. She is normal weight.  HENT:     Head: Normocephalic.     Right Ear: External ear normal.     Left Ear: External ear normal.     Nose: Nose normal.     Mouth/Throat:     Pharynx: Oropharynx is clear.  Eyes:     General: No scleral icterus.    Conjunctiva/sclera: Conjunctivae normal.  Cardiovascular:     Rate and Rhythm: Normal rate and regular rhythm.     Pulses: Normal pulses.     Heart sounds: Normal heart sounds.  Pulmonary:     Breath sounds: Normal breath sounds.  Abdominal:     Palpations: Abdomen is soft.  Skin:    General: Skin is warm and dry.  Neurological:     General: No focal deficit present.     Mental Status: She is alert and oriented to person, place, and time. Mental status is at baseline.     Comments: Gait is normal and she appears to be alert and oriented today.  Psychiatric:        Mood and Affect: Mood normal.        Behavior: Behavior normal.        Thought Content: Thought content normal.        Judgment: Judgment normal.       No results found for any visits on 08/16/20.  Assessment & Plan     1. Cerebellar  hemorrhage, acute ([place]) Per Dr. Leonie Man from neurology.  Continue aspirin and hopeful systolic blood pressure control of 130 or so. Patient is cleared to drive as long as husband is with her in the car. 2. Essential hypertension Amlodipine 10 mg sent in for mail order - amLODipine (NORVASC) 10 MG tablet; Take 1 tablet (10 mg total) by mouth daily.  Dispense: 90 tablet; Refill: 3  3. PAF (paroxysmal atrial fibrillation) ([place]) Per Dr. [name].  4. GAD (generalized anxiety disorder) On as needed Xanax.  Continue low-dose sertraline  5. History of breast cancer   6. Other hyperlipidemia On atorvastatin with goal LDL less than 70.   No follow-ups on file.      I, [name], MD, have reviewed all documentation for this visit. The documentation on 08/16/20 for the exam, diagnosis, procedures, and orders are all accurate and complete.    Ryley Teater Cranford Mon, MD  [facility] [phone] (phone) [phone] (fax)  [place]

## 2020-08-17 ENCOUNTER — Other Ambulatory Visit: Payer: Self-pay

## 2020-08-17 ENCOUNTER — Encounter: Payer: Self-pay | Admitting: Physical Therapy

## 2020-08-17 ENCOUNTER — Ambulatory Visit: Payer: Medicare Other

## 2020-08-17 DIAGNOSIS — R2681 Unsteadiness on feet: Secondary | ICD-10-CM

## 2020-08-17 DIAGNOSIS — M6281 Muscle weakness (generalized): Secondary | ICD-10-CM

## 2020-08-17 DIAGNOSIS — R2689 Other abnormalities of gait and mobility: Secondary | ICD-10-CM

## 2020-08-17 NOTE — Therapy (Signed)
[place] MAIN [facility] SERVICES [address] [place], [place], [address] Phone: [phone]   Fax:  [phone]  Physical Therapy Treatment  Patient Details  Name: Sandra [name] MRN: [id number] Date of Birth: 1946-02-04 Referring Provider (PT): Jerrol Banana., MD   Encounter Date: 08/17/2020   PT End of Session - 08/17/20 1111    Visit Number 6    Number of Visits 25    Date for PT Re-Evaluation 10/20/20    Authorization Type eval performed 8/0/9983 (recert for 3/82/5053)    Progress Note Due on Visit 10    PT Start Time 1106    PT Stop Time 1145    PT Time Calculation (min) 39 min    Equipment Utilized During Treatment Gait belt    Activity Tolerance Patient tolerated treatment well    Behavior During Therapy [facility] for tasks assessed/performed           Past Medical History:  Diagnosis Date  . (HFpEF) heart failure with preserved ejection fraction ([place])    a. 08/2019 Echo: EF 60-65%, no rwma, mild LVH, Gr2 DD, nl RV fxn. Nl pASP. Mildly dil LA. Mild to mod MR.  [name] Anemia   . Anxiety   . Arthritis   . BRCA gene mutation negative 10/2016   NEGATIVE: [employer]  . Breast cancer ([place]) 10/16/2016   T1c,N0;, GRADE I/III, 1.6cm. ER/PR pos  HER2 not over expressed, Right Upper Outer  . Celiac disease   . Depression   . Dyspnea    WITH EXERTION  . GERD (gastroesophageal reflux disease)    OCC  . Glaucoma    right eye  . Headache    H/O MIGRAINES AS TEENAGER  . Heart murmur    a. 08/2019 Echo: mild to mod MR.  [name] Hypertension   . Hypothyroidism   . Non-obstructive CAD (coronary artery disease)    a. 01/2017 MV: Hypertensive response. No ischemia/infarct. EF >65%; b. 09/2019 Cor CTA: Ca2+ = 9.29 (36th percentile). LAD calcified plaque (0-24%), otw nl. Multipel bilat pulm nodules up to 37m.  .Marland KitchenPAF (paroxysmal atrial fibrillation) ([place]    a. 02/2020 Zio: predominantly RSR, 65 (50-105), rare PACs/PVCs, 8 beats NSVT, multiple episodes  of PAF lasting up to 1hr 228ms. Avg AF rate 130 (93-170). AF burden <1%. Triggered events = RSR, PACs, and PAF; b. CHA2DS2VASc = 6.  . Pneumonia    YEARS AGO  . Pulmonary nodules    a. 09/2019 Cor CTA: incidental finding of multiple bilat pulm nodules; b. 12/2019 High Res CT: stable, scattered solid pulm nodules.  . Raynaud's disease   . RLS (restless legs syndrome)     Past Surgical History:  Procedure Laterality Date  . ABDOMINAL HYSTERECTOMY    . APPENDECTOMY    . AXILLARY LYMPH NODE BIOPSY Right 03/05/2017   Procedure: AXILLARY LYMPH NODE BIOPSY;  Surgeon: ByRobert BellowMD;  Location: [facility] ORS;  Service: General;  Laterality: Right;  . CATARACT EXTRACTION Bilateral   . CESAREAN SECTION    . COLONOSCOPY WITH PROPOFOL N/A 05/19/2015   Procedure: COLONOSCOPY WITH PROPOFOL;  Surgeon: RoManya SilvasMD;  [facility];  Service: Endoscopy;  Laterality: N/A;  . COLONOSCOPY WITH PROPOFOL N/A 08/01/2019   Procedure: COLONOSCOPY WITH PROPOFOL;  Surgeon: WoLucilla LameMD;  [facility];  Service: Endoscopy;  Laterality: N/A;  . EYE SURGERY    . HAND SURGERY Left   . MASTECTOMY Right 11/07/2016   RESIDUAL  INVASIVE MAMMARY CARCINOMA, SUBAREOLAR ANTERIOR TO PREVIOUS   . SENTINEL NODE BIOPSY Right 11/07/2016   Procedure: SENTINEL NODE BIOPSY;  Surgeon: Robert Bellow, MD;  Location: [facility] ORS;  Service: General;  Laterality: Right;  . SIMPLE MASTECTOMY WITH AXILLARY SENTINEL NODE BIOPSY Right 11/07/2016   6 mm ER/PR 100%; Her 2 neu not overexpressed, T1b, N0.  Surgeon: Robert Bellow, MD;  Location: [facility] ORS;  Service: General;  Laterality: Right;  . TONSILLECTOMY  AGE 39  . UPPER GI ENDOSCOPY  01/12/06   hiatus hernia    There were no vitals filed for this visit.   Subjective Assessment - 08/17/20 1110    Subjective Pt reports she is now able to drive. She thinks she can be done with PT this week and she feels she is back to her usual self. No dizziness or  lightheadedness since episode that occured previously session.    Patient is accompained by: Family member   friend   Pertinent History Pt is a 75 y/o female who presented to ED on 07/15/2020 due to dizziness where she was diagnosed with a hemorrhagic CVA in L cerebellar dentate nucleus. Pt pertinent PMH includes anemia, heart failure, paroxysmal atrial fibrillation, GERD, breast CA (2018), depression, celiac disease, CAD, restless leg syndrome, hypertension and hypothyroidism. Pt was using a walker upon d/c but reports she has discontinued use of RW.    Limitations Standing;Walking;Lifting;House hold activities    Diagnostic tests Relevant imaging: CT HEAD WO CONTRAST performed 07/15/2020 per chart states: "IMPRESSION:  Focal area of hemorrhage with adjacent edema in the left cerebellar dentate nucleus. Suspect acute hemorrhagic infarct in this area. Elsewhere there is mild atrophy with patchy periventricular small vessel disease. There are foci of arterial vascular calcification. There is mucosal thickening in several ethmoid air cells." 07/28/2020 CT HEAD WO CONTRAST per chart: "IMPRESSION: Resolving left cerebellar intraparenchymal hematoma. No interval  hemorrhage. No associated abnormal mass effect."    Patient Stated Goals PT and pt discussed improving pt's balance    Currently in Pain? No/denies    Pain Score 0-No pain           Vitals:  BP: 118/60 mm Hg  HR: 64 BPM  SPO2: 97%   Neuro Re-Ed:   Toe taps with increased speed on 6" step no UE support: 3x30 sec, no lateral sway or LOB. supervision    SLS rolling soccer ball forwards/ backwards then side to side: x12/direction/LE. Use of SUE support on // bar to get foot on ball safely then performed with no UE's. Good balance, no sway or difficulty. Hard time on LLE coordinating side to side ball roll compared to RLE. Supervision to intermittent CGA for safety.   Lunge position with forward foot on airex pad and back foot on ground: 1x8/LE.  Use of hands for support and knee pain. CGA. Discontinued due to reports of knee discomfort.   Hallway: 30' walking with CGA throughout   Vertical/horizontal head turns. Good speed, x1 bout of variable steps but indep regained balance with vertical head turns.     Changes in gait speed: Slow/fast, good control no steppage, LOB, or sway.    Backwards walking: consistent speed, intermittent sway but no LOB. Equal step lengths.    Tandem walking: most difficult for pt. Intermittent sway throughout entirety with decreased speed. Variable heel to toe steps. No LOB.    Resisted waling forwards/backwards with use of weight as perturbation: 12.5 lbs, x5/direction. CGA. Verbal cues to heel strike  and equal step lengths and improving eccentric control.    PT Education - 08/17/20 1149    Education Details form/technique with balance exercise.    Person(s) Educated Patient    Methods Explanation;Demonstration;Verbal cues    Comprehension Verbalized understanding;Returned demonstration            PT Short Term Goals - 07/28/20 1700      PT SHORT TERM GOAL #1   Title Patient will be independent in home exercise program to improve strength/mobility for better functional independence with ADLs.    Baseline 07/28/2020 Eval completed. HEP to be initiated next session    Time 6    Period Weeks    Status New    Target Date 09/08/20             PT Long Term Goals - 08/03/20 1040      PT LONG TERM GOAL #1   Title Patient will increase Functional Gait Assessment score to >24/30 as to reduce fall risk and demonstrate meaningful improvement in dynamic gait safety with community ambulation.    Baseline 07/28/2020 Pt FGA score 20/30    Time 12    Period Weeks    Status New      PT LONG TERM GOAL #2   Title Patient will increase Berg Balance score by > 6 points to demonstrate decreased fall risk during functional activities.    Baseline 07/28/2020 BERG score 45/56    Time 12    Period Weeks    Status  New      PT LONG TERM GOAL #3   Title Pt will demonstrate improvement in B hip flexion and B ankle dorsiflexion by at least 1/2 point on MMT to indicate improvements in functional mobility.    Baseline 07/28/2020 Hip flexors 4/5 B, ankle dorsiflexors 4+/5 B    Time 12    Period Weeks    Status New      PT LONG TERM GOAL #4   Title Pt will demonstrate ability to maintain SLB for at least 5 seconds on B LES in order to improve safety with navigating over and around objects.    Baseline 07/28/2020 Pt unable to perform, requires assistance to prevent fall when she attempts    Time 12    Period Weeks    Status New      PT LONG TERM GOAL #5   Title Pt will improve FOTO to target score to display perceived improvements in ability to complete ADL's.    Baseline 3/8: 56/71    Time 12    Period Weeks    Status New    Target Date 10/20/20                 Plan - 08/17/20 1150    Clinical Impression Statement Pt's vitals continue to remain in optimal ranges for safe participation in therapy. Pt performed bouts of dynamic balance exercises similar to components on the Berg and FGA to gauge rough estimate on how pt will score with her goals on next session. Pt educated that if she meets her goals and feels comfortable with her balance and that she is back to her PLOF, PT is comfortable with D/c and be given progressive HEP she can perform independently at home. Pt verbalized understanding.  Will assess pt's short/long term goals on next session and based off of progress, will continue POC to optimize functional outcomes or D/c from therapy.    Personal Factors and Comorbidities Age;Comorbidity 3+;Sex;Comorbidity  1;Comorbidity 2;Transportation    Comorbidities other pertinent comorbidities include anemia, heart failure, paroxysmal atrial fibrillation, GERD, breast CA (2018), depression, celiac disease, CAD, restless leg syndrome, hypertension and hypothyroidism.    Examination-Activity Limitations  Lift;Locomotion Level;Carry;Stand;Stairs;Bathing;Reach Overhead;Squat;Bend    Examination-Participation Restrictions Medication Management;Community Activity;Driving;Shop;Yard Work;Laundry;Cleaning    Stability/Clinical Decision Making Stable/Uncomplicated    Rehab Potential Good    PT Frequency 2x / week    PT Duration 12 weeks    PT Treatment/Interventions ADLs/Self Care Home Management;Canalith Repostioning;Cryotherapy;Electrical Stimulation;Moist Heat;Traction;Ultrasound;DME Instruction;Gait training;Stair training;Functional mobility training;Therapeutic activities;Therapeutic exercise;Balance training;Neuromuscular re-education;Patient/family education;Orthotic Fit/Training;Manual techniques;Passive range of motion;Vestibular;Visual/perceptual remediation/compensation;Joint Manipulations    PT Next Visit Plan Reassess goals.    PT Home Exercise Plan Issued instruction in static/dynamic exercises- feet apart, together, staggered and progress to eyes closed as able. Patient verbalized and demonstrated good understanding.    Consulted and Agree with Plan of Care Patient;Other (Comment)   Pt with friend for eval          Patient will benefit from skilled therapeutic intervention in order to improve the following deficits and impairments:  Abnormal gait,Decreased balance,Difficulty walking,Dizziness,Improper body mechanics,Decreased coordination,Decreased activity tolerance,Postural dysfunction,Impaired vision/preception,Decreased mobility,Decreased range of motion,Decreased strength,Impaired flexibility,Hypomobility  Visit Diagnosis: Unsteadiness on feet  Muscle weakness (generalized)  Other abnormalities of gait and mobility     Problem List Patient Active Problem List   Diagnosis Date Noted  . Cerebellar hemorrhage, acute ([place]) 07/17/2020  . Hemorrhagic cerebrovascular accident (CVA) ([place]) 07/16/2020  . PAF (paroxysmal atrial fibrillation) ([place])   . Personal history of colonic  polyps   . Chronic diastolic heart failure ([place]) 05/17/2017  . Essential hypertension 05/17/2017  . Glaucoma (increased eye pressure) 02/06/2017  . Invasive ductal carcinoma of breast, female, right ([place]) 10/20/2016  . Prolapse of vaginal vault after hysterectomy 08/24/2016  . Vaginal atrophy 08/24/2016  . Dyspareunia in female 08/24/2016  . Colon polyps 01/06/2015  . BCC (basal cell carcinoma of skin) 01/06/2015  . Hypothyroidism 01/06/2015  . Vitamin D deficiency 01/06/2015  . Hyperlipidemia 01/06/2015  . Depression, major 01/06/2015  . GAD (generalized anxiety disorder) 01/06/2015  . RLS (restless legs syndrome) 01/06/2015  . Cataract 01/06/2015  . Raynaud phenomenon 01/06/2015  . GERD (gastroesophageal reflux disease) 01/06/2015  . Celiac disease 01/06/2015  . OA (osteoarthritis) 01/06/2015  . Osteoporosis 01/06/2015  . Pre-diabetes 01/06/2015  . Avitaminosis D 01/06/2015  . Raynaud's syndrome without gangrene 01/06/2015  . Age-related osteoporosis without current pathological fracture 01/06/2015  . Anxiety disorder 01/05/2015    Salem Caster. Fairly IV, PT, DPT Physical Therapist- [facility]  08/17/2020, 12:07 PM  [place] MAIN [facility] SERVICES [address] [place], [place], [address] Phone: [phone]   Fax:  [phone]  Name: Sandra [name] MRN: [id number] Date of Birth: 1946-01-18

## 2020-08-18 ENCOUNTER — Encounter: Payer: Medicare Other | Admitting: Family Medicine

## 2020-08-19 ENCOUNTER — Ambulatory Visit: Payer: Medicare Other | Admitting: Neurology

## 2020-08-19 ENCOUNTER — Other Ambulatory Visit: Payer: Self-pay

## 2020-08-19 ENCOUNTER — Other Ambulatory Visit: Payer: Self-pay | Admitting: Family Medicine

## 2020-08-19 ENCOUNTER — Ambulatory Visit: Payer: Medicare Other

## 2020-08-19 DIAGNOSIS — R2681 Unsteadiness on feet: Secondary | ICD-10-CM

## 2020-08-19 DIAGNOSIS — R2689 Other abnormalities of gait and mobility: Secondary | ICD-10-CM

## 2020-08-19 DIAGNOSIS — M6281 Muscle weakness (generalized): Secondary | ICD-10-CM

## 2020-08-20 ENCOUNTER — Ambulatory Visit: Payer: Medicare Other | Admitting: Internal Medicine

## 2020-08-20 ENCOUNTER — Telehealth: Payer: Self-pay | Admitting: Internal Medicine

## 2020-08-20 NOTE — Telephone Encounter (Signed)
To Dr. [name] as an [name].  According to the patient's medication list, she is currently not on anticoagulation.

## 2020-08-20 NOTE — Telephone Encounter (Signed)
Patient calling to confirm appt next week .   She also wants provider to know she lost her balance yesterday morning and hit her head .  Patient was trying to put on socks and lost balance .  Patient denies LOC, bleeding, bruising , or swelling.

## 2020-08-20 NOTE — Therapy (Signed)
[place] MAIN [facility] SERVICES [address] [place], [place], [address] Phone: [phone]   Fax:  [phone]  Physical Therapy Treatment/ Discharge Summary  Patient Details  Name: Sandra [name] MRN: [id number] Date of Birth: 09-15-1945 Referring Provider (PT): Jerrol Banana., MD   Encounter Date: 08/19/2020   PT End of Session - 08/19/20 1306    Visit Number 7    Number of Visits 25    Date for PT Re-Evaluation 10/20/20    Authorization Type eval performed 08/30/3152 (recert for 0/12/6759)    Progress Note Due on Visit 10    PT Start Time 1300    PT Stop Time 1343    PT Time Calculation (min) 43 min    Equipment Utilized During Treatment Gait belt    Activity Tolerance Patient tolerated treatment well    Behavior During Therapy [facility] for tasks assessed/performed           Past Medical History:  Diagnosis Date  . (HFpEF) heart failure with preserved ejection fraction ([place])    a. 08/2019 Echo: EF 60-65%, no rwma, mild LVH, Gr2 DD, nl RV fxn. Nl pASP. Mildly dil LA. Mild to mod MR.  [name] Anemia   . Anxiety   . Arthritis   . BRCA gene mutation negative 10/2016   NEGATIVE: [employer]  . Breast cancer ([place]) 10/16/2016   T1c,N0;, GRADE I/III, 1.6cm. ER/PR pos  HER2 not over expressed, Right Upper Outer  . Celiac disease   . Depression   . Dyspnea    WITH EXERTION  . GERD (gastroesophageal reflux disease)    OCC  . Glaucoma    right eye  . Headache    H/O MIGRAINES AS TEENAGER  . Heart murmur    a. 08/2019 Echo: mild to mod MR.  [name] Hypertension   . Hypothyroidism   . Non-obstructive CAD (coronary artery disease)    a. 01/2017 MV: Hypertensive response. No ischemia/infarct. EF >65%; b. 09/2019 Cor CTA: Ca2+ = 9.29 (36th percentile). LAD calcified plaque (0-24%), otw nl. Multipel bilat pulm nodules up to 45m.  .Marland KitchenPAF (paroxysmal atrial fibrillation) ([place]    a. 02/2020 Zio: predominantly RSR, 65 (50-105), rare PACs/PVCs, 8 beats NSVT,  multiple episodes of PAF lasting up to 1hr 260ms. Avg AF rate 130 (93-170). AF burden <1%. Triggered events = RSR, PACs, and PAF; b. CHA2DS2VASc = 6.  . Pneumonia    YEARS AGO  . Pulmonary nodules    a. 09/2019 Cor CTA: incidental finding of multiple bilat pulm nodules; b. 12/2019 High Res CT: stable, scattered solid pulm nodules.  . Raynaud's disease   . RLS (restless legs syndrome)     Past Surgical History:  Procedure Laterality Date  . ABDOMINAL HYSTERECTOMY    . APPENDECTOMY    . AXILLARY LYMPH NODE BIOPSY Right 03/05/2017   Procedure: AXILLARY LYMPH NODE BIOPSY;  Surgeon: ByRobert BellowMD;  Location: [facility] ORS;  Service: General;  Laterality: Right;  . CATARACT EXTRACTION Bilateral   . CESAREAN SECTION    . COLONOSCOPY WITH PROPOFOL N/A 05/19/2015   Procedure: COLONOSCOPY WITH PROPOFOL;  Surgeon: RoManya SilvasMD;  [facility];  Service: Endoscopy;  Laterality: N/A;  . COLONOSCOPY WITH PROPOFOL N/A 08/01/2019   Procedure: COLONOSCOPY WITH PROPOFOL;  Surgeon: WoLucilla LameMD;  [facility];  Service: Endoscopy;  Laterality: N/A;  . EYE SURGERY    . HAND SURGERY Left   . MASTECTOMY Right 11/07/2016  RESIDUAL INVASIVE MAMMARY CARCINOMA, SUBAREOLAR ANTERIOR TO PREVIOUS   . SENTINEL NODE BIOPSY Right 11/07/2016   Procedure: SENTINEL NODE BIOPSY;  Surgeon: Robert Bellow, MD;  Location: [facility] ORS;  Service: General;  Laterality: Right;  . SIMPLE MASTECTOMY WITH AXILLARY SENTINEL NODE BIOPSY Right 11/07/2016   6 mm ER/PR 100%; Her 2 neu not overexpressed, T1b, N0.  Surgeon: Robert Bellow, MD;  Location: [facility] ORS;  Service: General;  Laterality: Right;  . TONSILLECTOMY  AGE 17  . UPPER GI ENDOSCOPY  01/12/06   hiatus hernia    There were no vitals filed for this visit.   Subjective Assessment - 08/19/20 1303    Subjective Patient reports she is feeling great and back to her normal. Denies any pain or falls. She states she is back to driving and  back to performing all ADLs on her own.    Patient is accompained by: Family member   friend   Pertinent History Pt is a 75 y/o female who presented to ED on 07/15/2020 due to dizziness where she was diagnosed with a hemorrhagic CVA in L cerebellar dentate nucleus. Pt pertinent PMH includes anemia, heart failure, paroxysmal atrial fibrillation, GERD, breast CA (2018), depression, celiac disease, CAD, restless leg syndrome, hypertension and hypothyroidism. Pt was using a walker upon d/c but reports she has discontinued use of RW.    Limitations Standing;Walking;Lifting;House hold activities    Diagnostic tests Relevant imaging: CT HEAD WO CONTRAST performed 07/15/2020 per chart states: "IMPRESSION:  Focal area of hemorrhage with adjacent edema in the left cerebellar dentate nucleus. Suspect acute hemorrhagic infarct in this area. Elsewhere there is mild atrophy with patchy periventricular small vessel disease. There are foci of arterial vascular calcification. There is mucosal thickening in several ethmoid air cells." 07/28/2020 CT HEAD WO CONTRAST per chart: "IMPRESSION: Resolving left cerebellar intraparenchymal hematoma. No interval  hemorrhage. No associated abnormal mass effect."    Patient Stated Goals PT and pt discussed improving pt's balance    Currently in Pain? No/denies              [facility] PT Assessment - 08/19/20 1325      Berg Balance Test   Sit to Stand Able to stand without using hands and stabilize independently    Standing Unsupported Able to stand safely 2 minutes    Sitting with Back Unsupported but Feet Supported on Floor or Stool Able to sit safely and securely 2 minutes    Stand to Sit Sits safely with minimal use of hands    Transfers Able to transfer safely, minor use of hands    Standing Unsupported with Eyes Closed Able to stand 10 seconds safely    Standing Unsupported with Feet Together Able to place feet together independently and stand 1 minute safely    From Standing,  Reach Forward with Outstretched Arm Can reach confidently >25 cm (10")    From Standing Position, Pick up Object from Floor Able to pick up shoe safely and easily    From Standing Position, Turn to Look Behind Over each Shoulder Looks behind from both sides and weight shifts well    Turn 360 Degrees Able to turn 360 degrees safely in 4 seconds or less    Standing Unsupported, Alternately Place Feet on Step/Stool Able to stand independently and safely and complete 8 steps in 20 seconds    Standing Unsupported, One Foot in Front Able to plae foot ahead of the other independently and hold 30 seconds  Standing on One Leg Able to lift leg independently and hold 5-10 seconds    Total Score 54               Reassessed all STG and LTG today.  Patient presents with independence with HEP and to continue on her own with higher level balance and walking program.    Clinical Impression: Patient has vastly improved with dynamic/static gait and balance and presents as independent with all mobility. Patient has met all goals including improved FGA= 29/30 and BERG = 54/56 - with no report of falls and walking without an assistive device. She was also able to exhibit much improved stance time on one leg and 5/5 BLE muscle strength with manual muscle testing. She met her FOTO score goal and states she feels she is ready to continue with her HEP on her own. She is appropriate for discharge from skilled PT services with all goals met and functioning at an independent level.                     PT Short Term Goals - 08/19/20 1308      PT SHORT TERM GOAL #1   Title Patient will be independent in home exercise program to improve strength/mobility for better functional independence with ADLs.    Baseline 07/28/2020 Eval completed. HEP to be initiated next session. 08/19/2020- Patient reports walking, performing steps, standing LE strengthening exercises- Without questions    Time 6    Period  Weeks    Status Achieved    Target Date 09/08/20             PT Long Term Goals - 08/19/20 1312      PT LONG TERM GOAL #1   Title Patient will increase Functional Gait Assessment score to >24/30 as to reduce fall risk and demonstrate meaningful improvement in dynamic gait safety with community ambulation.    Baseline 07/28/2020 Pt FGA score 20/30. 08/19/2020- FGA score=29/30 indicating a clinically significant improvement in overall gait/balance.    Time 12    Period Weeks    Status Achieved      PT LONG TERM GOAL #2   Title Patient will increase Berg Balance score by > 6 points to demonstrate decreased fall risk during functional activities.    Baseline 07/28/2020 BERG score 45/56. 08/19/2020- 54/56 indicating a clinically significant improvement in dynamic standing balance- No report of falling and does not use an assistive device.    Time 12    Period Weeks    Status Achieved      PT LONG TERM GOAL #3   Title Pt will demonstrate improvement in B hip flexion and B ankle dorsiflexion by at least 1/2 point on MMT to indicate improvements in functional mobility.    Baseline 07/28/2020 Hip flexors 4/5 B, ankle dorsiflexors 4+/5 B. 08/19/2020- Patient presents with 5/5 bilateral Hip flex and ankle DF with manual muscle testing. Functionally she had returned to totally independent with all ADL's    Time 12    Period Weeks    Status Achieved      PT LONG TERM GOAL #4   Title Pt will demonstrate ability to maintain SLB for at least 5 seconds on B LES in order to improve safety with navigating over and around objects.    Baseline 07/28/2020 Pt unable to perform, requires assistance to prevent fall when she attempts. 08/19/2020- Patient able to demonstrate ability to single leg stand approx 7-8 sec each  leg without loss of balance.    Time 12    Period Weeks    Status Achieved      PT LONG TERM GOAL #5   Title Pt will improve FOTO to target score to display perceived improvements in ability to  complete ADL's.    Baseline 3/8: 56/71. 08/19/2020= 78 (met goal of 71)    Time 12    Period Weeks    Status Achieved                 Plan - 08/19/20 0944    Clinical Impression Statement Patient has vastly improved with dynamic/static gait and balance and presents as independent with all mobility. Patient has met all goals including improved FGA= 29/30 and BERG = 54/56 - with no report of falls and walking without an assistive device. She was also able to exhibit much improved stance time on one leg and 5/5 BLE muscle strength with manual muscle testing. She met her FOTO score goal and states she feels she is ready to continue with her HEP on her own. She is appropriate for discharge from skilled PT services with all goals met and functioning at an independent level.    Personal Factors and Comorbidities Age;Comorbidity 3+;Sex;Comorbidity 1;Comorbidity 2;Transportation    Comorbidities other pertinent comorbidities include anemia, heart failure, paroxysmal atrial fibrillation, GERD, breast CA (2018), depression, celiac disease, CAD, restless leg syndrome, hypertension and hypothyroidism.    Examination-Activity Limitations Lift;Locomotion Level;Carry;Stand;Stairs;Bathing;Reach Overhead;Squat;Bend    Examination-Participation Restrictions Medication Management;Community Activity;Driving;Shop;Yard Work;Laundry;Cleaning    Stability/Clinical Decision Making Stable/Uncomplicated    Rehab Potential Good    PT Frequency 2x / week    PT Duration 12 weeks    PT Treatment/Interventions ADLs/Self Care Home Management;Canalith Repostioning;Cryotherapy;Electrical Stimulation;Moist Heat;Traction;Ultrasound;DME Instruction;Gait training;Stair training;Functional mobility training;Therapeutic activities;Therapeutic exercise;Balance training;Neuromuscular re-education;Patient/family education;Orthotic Fit/Training;Manual techniques;Passive range of motion;Vestibular;Visual/perceptual  remediation/compensation;Joint Manipulations    PT Next Visit Plan Plan to discharge patient today with all goals met.    PT Home Exercise Plan Reviewed balance and walking Home program and patient with good working knowledge.    Consulted and Agree with Plan of Care Patient;Other (Comment)   Pt with friend for eval          Patient will benefit from skilled therapeutic intervention in order to improve the following deficits and impairments:  Abnormal gait,Decreased balance,Difficulty walking,Dizziness,Improper body mechanics,Decreased coordination,Decreased activity tolerance,Postural dysfunction,Impaired vision/preception,Decreased mobility,Decreased range of motion,Decreased strength,Impaired flexibility,Hypomobility  Visit Diagnosis: Unsteadiness on feet  Muscle weakness (generalized)  Other abnormalities of gait and mobility     Problem List Patient Active Problem List   Diagnosis Date Noted  . Cerebellar hemorrhage, acute ([place]) 07/17/2020  . Hemorrhagic cerebrovascular accident (CVA) ([place]) 07/16/2020  . PAF (paroxysmal atrial fibrillation) ([place])   . Personal history of colonic polyps   . Chronic diastolic heart failure ([place]) 05/17/2017  . Essential hypertension 05/17/2017  . Glaucoma (increased eye pressure) 02/06/2017  . Invasive ductal carcinoma of breast, female, right ([place]) 10/20/2016  . Prolapse of vaginal vault after hysterectomy 08/24/2016  . Vaginal atrophy 08/24/2016  . Dyspareunia in female 08/24/2016  . Colon polyps 01/06/2015  . BCC (basal cell carcinoma of skin) 01/06/2015  . Hypothyroidism 01/06/2015  . Vitamin D deficiency 01/06/2015  . Hyperlipidemia 01/06/2015  . Depression, major 01/06/2015  . GAD (generalized anxiety disorder) 01/06/2015  . RLS (restless legs syndrome) 01/06/2015  . Cataract 01/06/2015  . Raynaud phenomenon 01/06/2015  . GERD (gastroesophageal reflux disease) 01/06/2015  . Celiac disease 01/06/2015  .  OA (osteoarthritis)  01/06/2015  . Osteoporosis 01/06/2015  . Pre-diabetes 01/06/2015  . Avitaminosis D 01/06/2015  . Raynaud's syndrome without gangrene 01/06/2015  . Age-related osteoporosis without current pathological fracture 01/06/2015  . Anxiety disorder 01/05/2015    Lewis Moccasin, PT 08/20/2020, 9:53 AM  [place] MAIN [facility] SERVICES [address] [place], [place], [address] Phone: [phone]   Fax:  [phone]  Name: Sandra [name] MRN: [id number] Date of Birth: 1946-01-23

## 2020-08-23 NOTE — Progress Notes (Signed)
Stable lung nodules. Seeing her 3/29.22 and give her results then

## 2020-08-24 ENCOUNTER — Encounter: Payer: Self-pay | Admitting: Internal Medicine

## 2020-08-24 ENCOUNTER — Encounter: Payer: Self-pay | Admitting: Family Medicine

## 2020-08-24 ENCOUNTER — Ambulatory Visit: Payer: Medicare Other | Admitting: Physical Therapy

## 2020-08-24 ENCOUNTER — Ambulatory Visit: Payer: Medicare Other | Admitting: Internal Medicine

## 2020-08-24 ENCOUNTER — Other Ambulatory Visit: Payer: Self-pay

## 2020-08-24 VITALS — BP 114/60 | HR 63 | Temp 98.0°F | Ht 63.0 in | Wt 133.4 lb

## 2020-08-24 DIAGNOSIS — R0600 Dyspnea, unspecified: Secondary | ICD-10-CM

## 2020-08-24 DIAGNOSIS — Z889 Allergy status to unspecified drugs, medicaments and biological substances status: Secondary | ICD-10-CM

## 2020-08-24 DIAGNOSIS — R0609 Other forms of dyspnea: Secondary | ICD-10-CM

## 2020-08-24 DIAGNOSIS — R918 Other nonspecific abnormal finding of lung field: Secondary | ICD-10-CM

## 2020-08-24 NOTE — Patient Instructions (Addendum)
Dyspnea on exertion Chronic cough Pulmonary nodules/lesions, multiple History Seasonal Allergy  -Shortness of breath: is definitely due to stiff heart muscle with leaky mitral valve +/-  Possible asthma    -Cough: glad resolved after Omicron covid in Jan 2022  -Multiple lung nodules:   Stable x 3 months as of Aug 2021 -> stable again MArch 2022. I do not think the nodules are related to breast cancer.Blood TB test is negative  Plan -  Repeat CT scan of the chest in 9-12 months - Hold off starting empiric asthma inhaler treatment as per our discussion  - Monitor your current symptoms   Follow-up 9-126 months face to face visit but after CT chest without contrast  - return or call sooner if needed

## 2020-08-24 NOTE — Progress Notes (Signed)
OV 10/24/2019  Subjective:  Patient ID: Sandra [name], female , DOB: 13-Feb-1946 , age 75 y.o. , MRN: [id number] , ADDRESS: [place] [id number]   10/24/2019 -   Chief Complaint  Patient presents with  . Pulmonary Consult    Referred by Dr Rosanna Randy for eval of pulmonary nodules. Pt c/o SOB with exertion for the past year.      HPI Sandra [name] 28 y.o. 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  Louie Bun suggested patient see myself Dr. Chase Caller.  According to the patient she has had insidious onset of shortness of breath that is progressive in the last 3 to 4 years ever since she turned 75 years old.  Then 3 years ago she had breast cancer for which it was early and she had status post mastectomy with complete remission.  She is not able to do gardening and other things she used to do.  In fact when we walked her she did not desaturate 185 feet x 3 laps but did get mildly dyspneic.  She senses onset of chronic cough for the last 3 months.  There is also progressive.  She does not know why.  Then in March 2020 when she had unexplained right lower extremity edema involving the thigh knee and ankle.  She showed me pictures of this.  Apparently Doppler lower extremities ruled out DVT.  However she had a CT scan of the chest because of persistent shortness of breath.  Initially it was a coronary artery CT that then resulted in regular CT scan of the chest that I personally visualized.  This does not show emphysema pulmonary fibrosis but shows multiple lung nodules.  There is concern this could be due to prior breast cancer.  Therefore she has been referred here..  There is no prior history of tuberculosis.  There is no ACE inhibitor use.  She does have mild acid reflux.  She is on carvedilol nonspecific beta-blocker.  There is no prior history of asthma but she does have spring allergies.  Work-up below as shown moderate mitral valve regurgitation  with left ventricular hypertrophy and grade 2 diastolic dysfunction.  Also multiple lung nodules with some air trapping.  No emphysema fibrosis.  No pulmonary function test available.  Hemoglobin and creatinine are fine.    2018 Nuclear medicine stress test   Blood pressure demonstrated a hypertensive response to exercise with elevated BP at baseline.  The study is normal.  This is a low risk study.  The left ventricular ejection fraction is hyperdynamic (>65%).  Non diagnostic ECG due to abnormal baseline.    Simple office walk 185 feet x  3 laps goal with forehead probe 10/24/2019   O2 used ra  Number laps completed 3  Comments about pace avg  Resting Pulse Ox/HR 99% and 64/min  Final Pulse Ox/HR 97% and 79/min  Desaturated </= 88% no  Desaturated <= 3% points no  Got Tachycardic >/= 90/min no  Symptoms at end of test Mild dyspnea  Miscellaneous comments x   Results for Sandra, [name] (MRN [id number]) as of 10/24/2019 10:14  Ref. Range 06/30/2019 11:45 09/29/2019 09:36  Creatinine Latest Ref Range: 0.44 - 1.00 mg/dL 0.69 0.76   Results for Sandra, [name] (MRN [id number]) as of 10/24/2019 10:14  Ref. Range 06/30/2019 11:45 09/29/2019 09:36  Hemoglobin Latest Ref Range: 11.1 - 15.9 g/dL 12.3     EXAM: -  CT CHEST WITHOUT CONTRAST - personally visualized  TECHNIQUE: Multidetector CT imaging of the chest was performed following the standard protocol without IV contrast.  COMPARISON:  Coronary CT 10/02/2019  FINDINGS: Cardiovascular: Normal heart size. No pericardial effusion. Coronary artery calcifications are again noted better detailed on recent coronary CT. Atherosclerotic plaque within the normal caliber aorta. The left vertebral artery arises directly from the aortic arch. Minimal plaque in the proximal great vessels. Central pulmonary arteries are normal caliber. Luminal evaluation precluded in the absence of contrast media.  Mediastinum/Nodes: No mediastinal  fluid or gas. Normal thyroid gland and thoracic inlet. No acute abnormality of the trachea or esophagus. No worrisome mediastinal or axillary adenopathy. Hilar nodal evaluation is limited in the absence of intravenous contrast media.  Lungs/Pleura: Redemonstration of multiple pulmonary nodules throughout both lungs. These include:  *7 mm nodule, right middle lobe (3/82) *3 mm nodule, medial basal segment right lower lobe (3/102) *3 mm subpleural nodule lateral basal segment left lower lobe (3/105) *3 mm nodule apicoposterior segment left upper lobe (3/53)  Few additional centrilobular and tree-in-bud nodularity is seen in the left apex (3/33) and in the periphery of right apex (3/35, 39). Some mild mosaic attenuation towards the lung bases may reflect areas of air trapping. More mild diffuse central airways thickening. No consolidation, features of edema, pneumothorax, or effusion.  Upper Abdomen: No acute abnormalities present in the visualized portions of the upper abdomen.  Musculoskeletal: Multilevel degenerative changes are present in the imaged portions of the spine. No acute osseous abnormality or suspicious osseous lesion. Postsurgical changes from prior right mastectomy.  IMPRESSION: 1. Redemonstration of multiple pulmonary nodules throughout both lungs, largest again measuring up to 7 mm in the right middle lobe. Given patient's history of breast cancer, metastatic disease is not excluded. Consider short-term interval follow-up imaging versus tissue sampling. Fleischner Society criteria explicitly do not apply in patients with known primary malignancy. 2. Some mild mosaic attenuation towards the lung bases may reflect areas of air trapping/small airways disease with mild chronic bronchitic features. 3. Coronary artery disease, better detailed on recent coronary CT. 4. Aortic Atherosclerosis (ICD10-I70.0).   Electronically Signed   By: Lovena Le M.D.    On: 10/11/2019 18:05  ECHO 09/22/19   IMPRESSIONS    1. Left ventricular ejection fraction, by estimation, is 60 to 65%. The  left ventricle has normal function. The left ventricle has no regional  wall motion abnormalities. There is mild left ventricular hypertrophy.  Left ventricular diastolic parameters  are consistent with Grade II diastolic dysfunction (pseudonormalization).  2. Right ventricular systolic function is normal. The right ventricular  size is normal. There is normal pulmonary artery systolic pressure.  3. Left atrial size was mildly dilated.  4. Mild to moderate mitral valve regurgitation    ROS - per HPI   OV 02/05/2020   Subjective:  Patient ID: Sandra [name], female , DOB: 1945/11/08, age 35 y.o. years. , MRN: [id number],  ADDRESS: [place] [id number] PCP  Jerrol Banana., MD Providers : Treatment Team:  Attending Provider: Brand Males, MD   Chief Complaint  Patient presents with  . Follow-up    pt has lung nodule . pt has sob when walking up hill   Type of visit: Telephone/Video Circumstance: COVID-19 national emergency Identification of patient Sandra [name] with [birth year] and MRN [id number] - 2 person identifier Risks: Risks, benefits, limitations of telephone visit explained. Patient understood and verbalized agreement to proceed  Anyone else on call: - no just patient Patient location: patient home This provider location: [address] , [address], [place], [place]     HPI Sandra [name] 74 y.o. -on this telephone visit to review results.  In terms of dyspnea/.Cough patient explicitly told me that she still has dyspnea and some cough this is documented below.  However she says the symptoms are not significant enough that she feels like she needs an intervention.  Asthma work-up is negative but the CT scan shows air trapping.  We are discussed starting empiric asthma inhaler but she is not interested.   Cough is mostly at night.  It is mild.  She is off fish oil  She is more worried about the nodules: Regarding the lung nodules these now show 64-monthstability.  She wants to know if this could be breast cancer metastasis.  The fact that these nodules are small and the been stable for 3 months is reassuring but I have explained to her she is not out of the woods and she required serial follow-up for a total of 2 years.  She is willing to do this.  Blood allergy work-up essentially negative.  Blood IgE normal.  Exam nitric oxide test normal.  QuantiFERON gold normal.  She does have a grade 2 diastolic dysfunction and mitral valve regurg.  She follows with cardiology for this.   SYMPTOM SCALE - 02/05/2020   O2 use ra  Shortness of Breath 0 -> 5 scale with 5 being worst (score 6 If unable to do)  At rest 0  Simple tasks - showers, clothes change, eating, shaving 0-1  Household (dishes, doing bed, laundry) Mopping - goes up  Shopping x  Walking level at own pace x  Walking up Stairs 3  Total (30-36) Dyspnea Score x  How bad is your cough? Mild aggravating dry cough x months  How bad is your fatigue 3  How bad is nausea 0  How bad is vomiting?  0  How bad is diarrhea? 0  How bad is anxiety? Not really - situatoonal due to husband having falls. 3 back surgeries, recent syncope  How bad is depression Controlled with zoloft        IMPRESSION: CT CHEST 01/12/20 1. No evidence of interstitial lung disease. 2. Prominent patchy air trapping throughout both lungs, indicative of small airways disease. 3. Three month stability of scattered solid pulmonary nodules, reassuring. Suggest continued noncontrast chest CT follow-up in 6 months in this patient with a history of malignancy. 4. Three-vessel coronary atherosclerosis. 5. Tiny hiatal hernia. 6. Mild diffuse hepatic steatosis. 7. Aortic Atherosclerosis (ICD10-I70.0).   Electronically Signed   By: JIlona SorrelM.D.   On: 01/12/2020  13:14   RAST ALLERGY TEST   Results for Sandra, [name](MRN [id number] as of 02/05/2020 08:56  Ref. Range 10/24/2019 11:39  Class Description Allergens Unknown Comment  Pecan, Hickory IgE Latest Ref Range: Class 0 kU/L <0.10  D Pteronyssinus IgE Latest Ref Range: Class 0 kU/L <0.10  D Farinae IgE Latest Ref Range: Class 0 kU/L <0.10  Cat Dander IgE Latest Ref Range: Class 0 kU/L <0.10  Dog Dander IgE Latest Ref Range: Class 0 kU/L <0.10  BGuatemalaGrass IgE Latest Ref Range: Class 0 kU/L <0.10  Johnson Grass IgE Latest Ref Range: Class 0 kU/L <0.10  Penicillium Chrysogen IgE Latest Ref Range: Class 0 kU/L <0.10  Cladosporium Herbarum IgE Latest Ref Range: Class 0 kU/L <0.10  Aspergillus Fumigatus  IgE Latest Ref Range: Class 0 kU/L <0.10  Alternaria Alternata IgE Latest Ref Range: Class 0 kU/L <0.10  Common Silver Wendee Copp IgE Latest Ref Range: Class 0 kU/L <0.10  Oak, White IgE Latest Ref Range: Class 0 kU/L <0.10  Elm, American IgE Latest Ref Range: Class 0/I kU/L 0.10 (A)  Maple/Box Elder IgE Latest Ref Range: Class 0 kU/L <0.10  White Mulberry IgE Latest Ref Range: Class 0 kU/L <0.10  Cedar, Georgia IgE Latest Ref Range: Class 0 kU/L <0.10  Ragweed, Short IgE Latest Ref Range: Class 0 kU/L <0.10  Pigweed, Rough IgE Latest Ref Range: Class 0 kU/L <0.10  Cockroach, Korea IgE Latest Ref Range: Class 0 kU/L <0.10  Cottonwood IgE Latest Ref Range: Class 0 kU/L <0.10  IgE (Immunoglobulin E), Serum Latest Ref Range: 6 - 495 IU/mL 14  Timothy Grass IgE Latest Ref Range: Class 0/I kU/L 0.16 (A)  Sheep Sorrel IgE Qn Latest Ref Range: Class 0 kU/L <0.10  Mouse Urine IgE Latest Ref Range: Class 0 kU/L <0.10   Results for Sandra, [name] (MRN [id number]) as of 02/05/2020 08:56  Ref. Range 12/04/2019 14:56  FeNO level (ppb) Unknown 17   PFT Results Latest Ref Rng & Units 12/04/2019  FVC-Pre L 2.02  FVC-Predicted Pre % 73  FVC-Post L 2.04  FVC-Predicted Post % 74  Pre FEV1/FVC % % 81  Post  FEV1/FCV % % 84  FEV1-Pre L 1.63  FEV1-Predicted Pre % 79  FEV1-Post L 1.70  DLCO uncorrected ml/min/mmHg 15.22  DLCO UNC% % 82  DLCO corrected ml/min/mmHg 16.13  DLCO COR %Predicted % 87  DLVA Predicted % 105  TLC L 3.78  TLC % Predicted % 77  RV % Predicted % 76   QuantiFERON Incubation  Incubation performed.   QuantiFERON-TB Gold Plus Negative Negative      OV 08/24/2020  Subjective:  Patient ID: Sandra [name], female , DOB: Dec 02, 1945 , age 34 y.o. , MRN: [id number] , ADDRESS: [place] [id number] PCP Jerrol Banana., MD Patient Care Team: Jerrol Banana., MD as PCP - General (Family Medicine) End, Harrell Gave, MD as PCP - Cardiology (Cardiology) Leandrew Koyanagi, MD as Referring Physician (Ophthalmology) Bary Castilla, Forest Gleason, MD (General Surgery) Wilhelmina Mcardle., MD (Dentistry) Lucilla Lame, MD as Consulting Physician (Gastroenterology)  This Provider for this visit: Treatment Team:  Attending Provider: Brand Males, MD    08/24/2020 -   Chief Complaint  Patient presents with  . Follow-up    Feb 17 she had a brain bleed.  Breathing has been doing well.  No difference in her breathing since her last OV.    Follow-up multiple lung nodules largest 7 mm right middle lobe  Follow-up shortness of breath due to diastolic dysfunction and mitral valve regurg and atrial fibrillation +/-possible asthma as evidenced by air trapping and seasonal allergy  Follow-up chronic cough HPI Sandra [name] 75 y.o. -returns for follow-up.  Last visit was in September 2021.  After that she says in January 2022 she had omicron COVID.  Then in February 2022 she had a brain bleed.  She is off Eliquis.  This was in the cerebellar region.  She is slowly getting back to normalcy.  March 2022 CT scan showed improved absorption of the intracerebral hematoma.  Her daughter Nolene Ebbs has joined a call from [place].  She also has a  friend visiting her this time.  The reason they are here is  because they feel her memory is not that good.  Daughter feels patient is against taking a lot of medications and inhaler therapy might not help her for her air trapping.  The dyspnea stable but the cough is resolved.  She had CT scan of the chest in March 2022 that shows stability of the lung nodules and the results of this.  Other than the recent brain (no other issues that are new.     CT Chest data 07/27/20  CLINICAL DATA:  Lung nodule follow-up  EXAM: CT CHEST WITHOUT CONTRAST  TECHNIQUE: Multidetector CT imaging of the chest was performed following the standard protocol without IV contrast.  COMPARISON:  CT chest dated 01/12/2020  FINDINGS: Cardiovascular: The heart size is stable. There are coronary artery calcifications. There are atherosclerotic changes of the thoracic aorta. No significant pericardial effusion.  Mediastinum/Nodes:  -- No mediastinal lymphadenopathy.  -- No hilar lymphadenopathy.  -- No axillary lymphadenopathy.  -- No supraclavicular lymphadenopathy.  -- Normal thyroid gland where visualized.  -  Unremarkable esophagus.  Lungs/Pleura: Again noted are scattered bilateral pulmonary nodules. The largest is located in the right middle lobe and measures approximately 7 mm all of the visualized pulmonary nodules are affectively stable in size. No definite new pulmonary nodules. No pneumothorax. No large pleural effusion.  Upper Abdomen: There is no acute abnormality identified in the upper abdomen.  Musculoskeletal: No chest wall abnormality. No bony spinal canal stenosis.  IMPRESSION: 1. Stable bilateral pulmonary nodules. No new pulmonary nodules identified. 2. Coronary artery disease.  Aortic Atherosclerosis (ICD10-I70.0).   Electronically Signed   By: Constance Holster M.D.   On: 07/28/2020 00:22   PFT  PFT Results Latest Ref Rng & Units 12/04/2019   FVC-Pre L 2.02  FVC-Predicted Pre % 73  FVC-Post L 2.04  FVC-Predicted Post % 74  Pre FEV1/FVC % % 81  Post FEV1/FCV % % 84  FEV1-Pre L 1.63  FEV1-Predicted Pre % 79  FEV1-Post L 1.70  DLCO uncorrected ml/min/mmHg 15.22  DLCO UNC% % 82  DLCO corrected ml/min/mmHg 16.13  DLCO COR %Predicted % 87  DLVA Predicted % 105  TLC L 3.78  TLC % Predicted % 77  RV % Predicted % 76       has a past medical history of (HFpEF) heart failure with preserved ejection fraction ([place]), Anemia, Anxiety, Arthritis, BRCA gene mutation negative (10/2016), Breast cancer ([place]) (10/16/2016), Celiac disease, Depression, Dyspnea, GERD (gastroesophageal reflux disease), Glaucoma, Headache, Heart murmur, Hypertension, Hypothyroidism, Non-obstructive CAD (coronary artery disease), PAF (paroxysmal atrial fibrillation) ([place]), Pneumonia, Pulmonary nodules, Raynaud's disease, and RLS (restless legs syndrome).   reports that she has never smoked. She has never used smokeless tobacco.  Past Surgical History:  Procedure Laterality Date  . ABDOMINAL HYSTERECTOMY    . APPENDECTOMY    . AXILLARY LYMPH NODE BIOPSY Right 03/05/2017   Procedure: AXILLARY LYMPH NODE BIOPSY;  Surgeon: Robert Bellow, MD;  Location: [facility] ORS;  Service: General;  Laterality: Right;  . CATARACT EXTRACTION Bilateral   . CESAREAN SECTION    . COLONOSCOPY WITH PROPOFOL N/A 05/19/2015   Procedure: COLONOSCOPY WITH PROPOFOL;  Surgeon: Manya Silvas, MD;  Location: [facility] ENDOSCOPY;  Service: Endoscopy;  Laterality: N/A;  . COLONOSCOPY WITH PROPOFOL N/A 08/01/2019   Procedure: COLONOSCOPY WITH PROPOFOL;  Surgeon: Lucilla Lame, MD;  Location: [facility] ENDOSCOPY;  Service: Endoscopy;  Laterality: N/A;  . EYE SURGERY    . HAND SURGERY Left   . MASTECTOMY Right 11/07/2016  RESIDUAL INVASIVE MAMMARY CARCINOMA, SUBAREOLAR ANTERIOR TO PREVIOUS   . SENTINEL NODE BIOPSY Right 11/07/2016   Procedure: SENTINEL NODE BIOPSY;  Surgeon: Robert Bellow, MD;   Location: [facility] ORS;  Service: General;  Laterality: Right;  . SIMPLE MASTECTOMY WITH AXILLARY SENTINEL NODE BIOPSY Right 11/07/2016   6 mm ER/PR 100%; Her 2 neu not overexpressed, T1b, N0.  Surgeon: Robert Bellow, MD;  Location: [facility] ORS;  Service: General;  Laterality: Right;  . TONSILLECTOMY  AGE 49  . UPPER GI ENDOSCOPY  01/12/06   hiatus hernia    No Known Allergies  Immunization History  Administered Date(s) Administered  . Influenza, High Dose Seasonal PF 03/04/2015, 04/07/2016, 03/16/2017, 02/09/2018, 02/19/2019, 02/20/2020  . PFIZER(Purple Top)SARS-COV-2 Vaccination 06/18/2019, 07/12/2019, 01/27/2020  . Pneumococcal Conjugate-13 02/05/2014  . Pneumococcal Polysaccharide-23 04/07/2016  . Tdap 03/18/2008    Family History  Problem Relation Age of Onset  . Cancer Mother   . Anemia Mother   . Other Mother        lymphosarcoma  . Alcohol abuse Father   . Prostate cancer Father   . Breast cancer Sister        35; 1/2 sister, shared mother.  . Cancer Sister        bile duct  . Stroke Maternal Grandmother   . Breast cancer Maternal Grandmother 68  . Heart attack Maternal Grandfather   . CAD Paternal Grandfather      Current Outpatient Medications:  .  acetaminophen (TYLENOL) 500 MG tablet, Take 500-1,000 mg by mouth every 6 (six) hours as needed for mild pain or moderate pain., Disp: , Rfl:  .  ALPRAZolam (XANAX) 0.5 MG tablet, TAKE 1 TABLET BY MOUTH AT  BEDTIME, Disp: 90 tablet, Rfl: 0 .  amLODipine (NORVASC) 10 MG tablet, Take 1 tablet (10 mg total) by mouth daily., Disp: 90 tablet, Rfl: 3 .  atorvastatin (LIPITOR) 40 MG tablet, Take 40 mg by mouth daily., Disp: , Rfl:  .  Calcium Carb-Cholecalciferol 500-400 MG-UNIT TABS, Take 1-2 tablets by mouth daily., Disp: , Rfl:  .  carvedilol (COREG) 6.25 MG tablet, Take 1 tablet (6.25 mg total) by mouth 2 (two) times daily with a meal., Disp: 60 tablet, Rfl: 0 .  Cholecalciferol (VITAMIN D3) 125 MCG (5000 UT) TABS, Take 125  Units by mouth daily., Disp: , Rfl:  .  hydroxypropyl methylcellulose / hypromellose (ISOPTO TEARS / GONIOVISC) 2.5 % ophthalmic solution, Place 1 drop into both eyes 4 (four) times daily as needed for dry eyes., Disp: , Rfl:  .  latanoprost (XALATAN) 0.005 % ophthalmic solution, Place 1 drop into both eyes at bedtime., Disp: , Rfl: 5 .  letrozole (FEMARA) 2.5 MG tablet, Take 1 tablet (2.5 mg total) by mouth daily., Disp: 90 tablet, Rfl: 3 .  levothyroxine (SYNTHROID) 100 MCG tablet, TAKE 1 TABLET BY MOUTH 30  MINUTES BEFORE BREAKFAST (Patient taking differently: TAKE 1 TABLET BY MOUTH 30  MINUTES BEFORE BREAKFAST), Disp: 90 tablet, Rfl: 2 .  LOW-DOSE ASPIRIN PO, , Disp: , Rfl:  .  Multiple Vitamin (MULTIVITAMIN WITH MINERALS) TABS tablet, Take 1 tablet by mouth daily at 3 pm., Disp: , Rfl:  .  omeprazole (PRILOSEC) 40 MG capsule, TAKE 1 CAPSULE BY MOUTH  DAILY, Disp: 90 capsule, Rfl: 1 .  sennosides-docusate sodium (SENOKOT-S) 8.6-50 MG tablet, Take 1 tablet by mouth as needed for constipation., Disp: , Rfl:  .  sertraline (ZOLOFT) 100 MG tablet, TAKE 1 TABLET BY MOUTH  DAILY, Disp:  90 tablet, Rfl: 1 .  timolol (TIMOPTIC) 0.5 % ophthalmic solution, Place 1 drop into both eyes every morning., Disp: , Rfl:       Objective:   Vitals:   08/24/20 1556  BP: 114/60  Pulse: 63  Temp: 98 F (36.7 C)  TempSrc: Tympanic  SpO2: 98%  Weight: 133 lb 6 oz (60.5 kg)  Height: _0  (1.6 m)    Estimated body mass index is 23.63 kg/m as calculated from the following:   Height as of this encounter: _1  (1.6 m).   Weight as of this encounter: 133 lb 6 oz (60.5 kg).  _2 @  Filed Weights   08/24/20 1556  Weight: 133 lb 6 oz (60.5 kg)     Physical Exam   General: No distress. Looks well Neuro: Alert and Oriented x 3. GCS 15. Speech normal Psych: Pleasant Resp:  Barrel Chest - no.  Wheeze - no, Crackles - no, No overt respiratory distress CVS: Normal heart sounds. Murmurs -  no Ext: Stigmata of Connective Tissue Disease - no HEENT: Normal upper airway. PEERL +. No post nasal drip        Assessment:       ICD-10-CM   1. Multiple lung nodules  R91.8 CT Chest Wo Contrast  2. Dyspnea on exertion  R06.00   3. History of seasonal allergies  Z88.9        Plan:     Patient Instructions  Dyspnea on exertion Chronic cough Pulmonary nodules/lesions, multiple History Seasonal Allergy  -Shortness of breath: is definitely due to stiff heart muscle with leaky mitral valve +/-  Possible asthma    -Cough: glad resolved after Omicron covid in Jan 2022  -Multiple lung nodules:   Stable x 3 months as of Aug 2021 -> stable again MArch 2022. I do not think the nodules are related to breast cancer.Blood TB test is negative  Plan -  Repeat CT scan of the chest in 9-12 months - Hold off starting empiric asthma inhaler treatment as per our discussion  - Monitor your current symptoms   Follow-up 9-126 months face to face visit but after CT chest without contrast  - return or call sooner if needed     SIGNATURE    Dr. Brand Males, M.D., F.C.C.P,  Pulmonary and Critical Care Medicine Staff Physician, [place] Director - Interstitial Lung Disease  Program  Pulmonary [place] [facility] [place], [place], [address]  Pager: [phone], If no answer or between  15:00h - 7:00h: call [phone] Telephone: [phone]  4:42 PM 08/24/2020

## 2020-08-25 ENCOUNTER — Encounter: Payer: Self-pay | Admitting: Cardiology

## 2020-08-25 ENCOUNTER — Ambulatory Visit: Payer: Medicare Other | Admitting: Cardiology

## 2020-08-25 VITALS — BP 120/60 | HR 61 | Ht 63.0 in | Wt 134.0 lb

## 2020-08-25 DIAGNOSIS — I480 Paroxysmal atrial fibrillation: Secondary | ICD-10-CM | POA: Diagnosis not present

## 2020-08-25 DIAGNOSIS — I5032 Chronic diastolic (congestive) heart failure: Secondary | ICD-10-CM | POA: Diagnosis not present

## 2020-08-25 DIAGNOSIS — I10 Essential (primary) hypertension: Secondary | ICD-10-CM

## 2020-08-25 DIAGNOSIS — I619 Nontraumatic intracerebral hemorrhage, unspecified: Secondary | ICD-10-CM

## 2020-08-25 NOTE — Patient Instructions (Addendum)
Medication Instructions:  Your physician recommends that you continue on your current medications as directed. Please refer to the Current Medication list given to you today. *If you need a refill on your cardiac medications before your next appointment, please call your pharmacy*  Lab Work: None ordered. If you have labs (blood work) drawn today and your tests are completely normal, you will receive your results only by: [name] MyChart Message (if you have MyChart) OR . A paper copy in the mail If you have any lab test that is abnormal or we need to change your treatment, we will call you to review the results.  Testing/Procedures: None ordered.  Follow-Up: At [facility], you and your health needs are our priority.  As part of our continuing mission to provide you with exceptional heart care, we have created designated Provider Care Teams.  These Care Teams include your primary Cardiologist (physician) and Advanced Practice Providers (APPs -  Physician Assistants and Nurse Practitioners) who all work together to provide you with the care you need, when you need it.  Your next appointment:   Your physician wants you to follow-up in: 5-6 months with Dr. [name].

## 2020-08-25 NOTE — Telephone Encounter (Signed)
Patients daughter sent in My Chart message and forwarded to Dr. [name] for her appointment later today.

## 2020-08-25 NOTE — Progress Notes (Signed)
Electrophysiology Office Note:    Date:  08/25/2020   ID:  Sandra [name], DOB [birth year], MRN [id number]  PCP:  Jerrol Banana., MD  [facility] Cardiologist:  Nelva Bush, MD  [facility] Electrophysiologist:  Vickie Epley, MD   Referring MD: Nelva Bush, MD   Chief Complaint: Atrial fibrillation and history of falls and intracranial hemorrhage  History of Present Illness:    Sandra [name] is a 75 y.o. female who presents for an evaluation of atrial fibrillation and history of intracranial hemorrhage at the request of Dr. [name]. Their medical history includes multiple falls, hypertension, CAD.  She has a past medical history of intracranial hemorrhage while taking Eliquis.  Her intracranial hemorrhage was in February 2022.  When she presented she had several days of symptoms.  Upon arrival her anticoagulation was reversed with Kcentra and her blood pressure was aggressively controlled.  Repeat CT imaging of the brain showed a stable hemorrhage.  She was taken off her anticoagulant and remains on aspirin 81 mg by mouth daily.  Today she tells me she has had at least 2 falls.  The falls appear to be mechanical in nature.  No syncopal history. She tells me that she had not had a recent fall at the time of her intracranial hemorrhage.  She is accompanied by a friend today in clinic and her daughter is on the telephone during the visit.  Past Medical History:  Diagnosis Date  . (HFpEF) heart failure with preserved ejection fraction ([place])    a. 08/2019 Echo: EF 60-65%, no rwma, mild LVH, Gr2 DD, nl RV fxn. Nl pASP. Mildly dil LA. Mild to mod MR.  [name] Anemia   . Anxiety   . Arthritis   . BRCA gene mutation negative 10/2016   NEGATIVE: [employer]  . Breast cancer ([place]) 10/16/2016   T1c,N0;, GRADE I/III, 1.6cm. ER/PR pos  HER2 not over expressed, Right Upper Outer  . Celiac disease   . Depression   . Dyspnea    WITH EXERTION  . GERD (gastroesophageal reflux  disease)    OCC  . Glaucoma    right eye  . Headache    H/O MIGRAINES AS TEENAGER  . Heart murmur    a. 08/2019 Echo: mild to mod MR.  [name] Hypertension   . Hypothyroidism   . Non-obstructive CAD (coronary artery disease)    a. 01/2017 MV: Hypertensive response. No ischemia/infarct. EF >65%; b. 09/2019 Cor CTA: Ca2+ = 9.29 (36th percentile). LAD calcified plaque (0-24%), otw nl. Multipel bilat pulm nodules up to 71m.  .Marland KitchenPAF (paroxysmal atrial fibrillation) ([place]    a. 02/2020 Zio: predominantly RSR, 65 (50-105), rare PACs/PVCs, 8 beats NSVT, multiple episodes of PAF lasting up to 1hr 260ms. Avg AF rate 130 (93-170). AF burden <1%. Triggered events = RSR, PACs, and PAF; b. CHA2DS2VASc = 6.  . Pneumonia    YEARS AGO  . Pulmonary nodules    a. 09/2019 Cor CTA: incidental finding of multiple bilat pulm nodules; b. 12/2019 High Res CT: stable, scattered solid pulm nodules.  . Raynaud's disease   . RLS (restless legs syndrome)     Past Surgical History:  Procedure Laterality Date  . ABDOMINAL HYSTERECTOMY    . APPENDECTOMY    . AXILLARY LYMPH NODE BIOPSY Right 03/05/2017   Procedure: AXILLARY LYMPH NODE BIOPSY;  Surgeon: ByRobert BellowMD;  Location: [facility] ORS;  Service: General;  Laterality: Right;  . CATARACT EXTRACTION Bilateral   .  CESAREAN SECTION    . COLONOSCOPY WITH PROPOFOL N/A 05/19/2015   Procedure: COLONOSCOPY WITH PROPOFOL;  Surgeon: Manya Silvas, MD;  Location: [facility] ENDOSCOPY;  Service: Endoscopy;  Laterality: N/A;  . COLONOSCOPY WITH PROPOFOL N/A 08/01/2019   Procedure: COLONOSCOPY WITH PROPOFOL;  Surgeon: Lucilla Lame, MD;  Location: [facility] ENDOSCOPY;  Service: Endoscopy;  Laterality: N/A;  . EYE SURGERY    . HAND SURGERY Left   . MASTECTOMY Right 11/07/2016   RESIDUAL INVASIVE MAMMARY CARCINOMA, SUBAREOLAR ANTERIOR TO PREVIOUS   . SENTINEL NODE BIOPSY Right 11/07/2016   Procedure: SENTINEL NODE BIOPSY;  Surgeon: Robert Bellow, MD;  Location: [facility] ORS;  Service:  General;  Laterality: Right;  . SIMPLE MASTECTOMY WITH AXILLARY SENTINEL NODE BIOPSY Right 11/07/2016   6 mm ER/PR 100%; Her 2 neu not overexpressed, T1b, N0.  Surgeon: Robert Bellow, MD;  Location: [facility] ORS;  Service: General;  Laterality: Right;  . TONSILLECTOMY  AGE 68  . UPPER GI ENDOSCOPY  01/12/06   hiatus hernia    Current Medications: Current Meds  Medication Sig  . acetaminophen (TYLENOL) 500 MG tablet Take 500-1,000 mg by mouth every 6 (six) hours as needed for mild pain or moderate pain.  [name] ALPRAZolam (XANAX) 0.5 MG tablet TAKE 1 TABLET BY MOUTH AT  BEDTIME  . amLODipine (NORVASC) 10 MG tablet Take 1 tablet (10 mg total) by mouth daily.  [name] atorvastatin (LIPITOR) 40 MG tablet Take 40 mg by mouth daily.  . Calcium Carb-Cholecalciferol 500-400 MG-UNIT TABS Take 1-2 tablets by mouth daily.  . carvedilol (COREG) 6.25 MG tablet Take 1 tablet (6.25 mg total) by mouth 2 (two) times daily with a meal.  . Cholecalciferol (VITAMIN D3) 125 MCG (5000 UT) TABS Take 125 Units by mouth daily.  . hydroxypropyl methylcellulose / hypromellose (ISOPTO TEARS / GONIOVISC) 2.5 % ophthalmic solution Place 1 drop into both eyes 4 (four) times daily as needed for dry eyes.  [name] latanoprost (XALATAN) 0.005 % ophthalmic solution Place 1 drop into both eyes at bedtime.  [name] letrozole (FEMARA) 2.5 MG tablet Take 1 tablet (2.5 mg total) by mouth daily.  [name] levothyroxine (SYNTHROID) 100 MCG tablet TAKE 1 TABLET BY MOUTH 30  MINUTES BEFORE BREAKFAST (Patient taking differently: TAKE 1 TABLET BY MOUTH 30  MINUTES BEFORE BREAKFAST)  . LOW-DOSE ASPIRIN PO Take 81 mg by mouth daily.  . Multiple Vitamin (MULTIVITAMIN WITH MINERALS) TABS tablet Take 1 tablet by mouth daily at 3 pm.  . omeprazole (PRILOSEC) 40 MG capsule TAKE 1 CAPSULE BY MOUTH  DAILY  . sennosides-docusate sodium (SENOKOT-S) 8.6-50 MG tablet Take 1 tablet by mouth as needed for constipation.  . sertraline (ZOLOFT) 100 MG tablet TAKE 1 TABLET BY MOUTH   DAILY  . timolol (TIMOPTIC) 0.5 % ophthalmic solution Place 1 drop into both eyes every morning.     Allergies:   Patient has no known allergies.   Social History   Socioeconomic History  . Marital status: Married    Spouse name: Nadara Mustard  . Number of children: 2  . Years of education: Not on file  . Highest education level: Some college, no degree  Occupational History  . Occupation: retired  Tobacco Use  . Smoking status: Never Smoker  . Smokeless tobacco: Never Used  Vaping Use  . Vaping Use: Never used  Substance and Sexual Activity  . Alcohol use: Not Currently  . Drug use: No  . Sexual activity: Not Currently    Birth control/protection: None  Other Topics Concern  . Not on file  Social History Narrative   Lives with Husband   Right handed   Drinks 2-3 cups caffiene daily   Social Determinants of Health   Financial Resource Strain: Low Risk   . Difficulty of Paying Living Expenses: Not hard at all  Food Insecurity: No Food Insecurity  . Worried About Charity fundraiser in the Last Year: Never true  . Ran Out of Food in the Last Year: Never true  Transportation Needs: No Transportation Needs  . Lack of Transportation (Medical): No  . Lack of Transportation (Non-Medical): No  Physical Activity: Inactive  . Days of Exercise per Week: 0 days  . Minutes of Exercise per Session: 0 min  Stress: No Stress Concern Present  . Feeling of Stress : Only a little  Social Connections: Socially Integrated  . Frequency of Communication with Friends and Family: More than three times a week  . Frequency of Social Gatherings with Friends and Family: More than three times a week  . Attends Religious Services: More than 4 times per year  . Active Member of Clubs or Organizations: Yes  . Attends Archivist Meetings: More than 4 times per year  . Marital Status: Married     Family History: The patient's family history includes Alcohol abuse in her father; Anemia in  her mother; Breast cancer in her sister; Breast cancer (age of onset: 86) in her maternal grandmother; CAD in her paternal grandfather; Cancer in her mother and sister; Heart attack in her maternal grandfather; Other in her mother; Prostate cancer in her father; Stroke in her maternal grandmother.  ROS:   Please see the history of present illness.    All other systems reviewed and are negative.  EKGs/Labs/Other Studies Reviewed:    The following studies were reviewed today:  Hospitalization records  July 16, 2020 echo personally reviewed Left ventricular function normal, 65% Right ventricular function normal Mildly dilated left and right atria No significant valvular abnormalities  July 28, 2020 CT head IMPRESSION: Resolving left cerebellar intraparenchymal hematoma. No interval hemorrhage. No associated abnormal mass effect.  EKG:  The ekg ordered today demonstrates sinus rhythm  Recent Labs: 03/17/2020: Magnesium 2.2 07/22/2020: ALT 25; BUN 14; Creatinine, Ser 0.68; Hemoglobin 12.2; Platelets 254; Potassium 4.1; Sodium 142; TSH 5.570  Recent Lipid Panel    Component Value Date/Time   CHOL 137 07/17/2020 0436   CHOL 133 04/28/2020 1039   TRIG 95 07/17/2020 0436   HDL 50 07/17/2020 0436   HDL 50 04/28/2020 1039   CHOLHDL 2.7 07/17/2020 0436   VLDL 19 07/17/2020 0436   LDLCALC 68 07/17/2020 0436   LDLCALC 67 04/28/2020 1039    Physical Exam:    VS:  BP 120/60 (BP Location: Left Arm, Patient Position: Sitting, Cuff Size: Normal)   Pulse 61   Ht 5' 3"  (1.6 m)   Wt 134 lb (60.8 kg)   SpO2 98%   BMI 23.74 kg/m     Wt Readings from Last 3 Encounters:  08/25/20 134 lb (60.8 kg)  08/24/20 133 lb 6 oz (60.5 kg)  08/16/20 139 lb (63 kg)     GEN:  Well nourished, well developed in no acute distress HEENT: Normal NECK: No JVD; No carotid bruits LYMPHATICS: No lymphadenopathy CARDIAC: RRR, no murmurs, rubs, gallops RESPIRATORY:  Clear to auscultation without  rales, wheezing or rhonchi  ABDOMEN: Soft, non-tender, non-distended MUSCULOSKELETAL:  No edema; No deformity  SKIN: Warm and  dry NEUROLOGIC:  Alert and oriented x 3 PSYCHIATRIC:  Normal affect   ASSESSMENT:    1. PAF (paroxysmal atrial fibrillation) (HCC)    PLAN:    In order of problems listed above:  1. Paroxysmal atrial fibrillation Currently off anticoagulation given history of intracranial hemorrhage.  Currently taking aspirin 81 mg by mouth once daily.  During today's visit we spoke at length about the options for stroke prophylaxis given her history of atrial fibrillation and CHA2DS2-VASc of at least 5.  We discussed continuing on aspirin 81 mg by mouth once daily, rechallenging with anticoagulation or using a left atrial appendage occlusion device (watchman).  We discussed the risks and benefits of each option.  I do think she would be an excellent candidate for the watchman device but I will need to consult with the neurology team to see when they think it would be feasible for her to be on a short course of anticoagulation.  Given her history of intracranial hemorrhage, would favor using a reduced dose of a NOAC (Eliquis 2.5 mg by mouth twice daily) or DAPT only after watchman implant.  I discussed these alternative anticoagulation/antiplatelet regimens with the patient during today's visit.  I think it would be a good idea for her to touch base with the neurology team in approximately 3 months and then I will plan to see her back in the 1 or 2 months after that appointment.  If she is still stable at that time and they think it is feasible for her to undergo the watchman implant, we can proceed with scheduling.  The patient and her family were very agreeable to this plan.  2.  Hypertension Well-controlled in clinic today.  Continue home meds.  Total time spent with patient today 65 minutes. This includes reviewing records, evaluating the patient and coordinating care.  Medication  Adjustments/Labs and Tests Ordered: Current medicines are reviewed at length with the patient today.  Concerns regarding medicines are outlined above.  Orders Placed This Encounter  Procedures  . EKG 12-Lead   No orders of the defined types were placed in this encounter.    Signed, Lars Mage, MD, [facility]  08/25/2020 9:42 PM    Electrophysiology [place]

## 2020-08-26 ENCOUNTER — Ambulatory Visit: Payer: Medicare Other

## 2020-08-30 ENCOUNTER — Ambulatory Visit: Payer: Medicare Other

## 2020-09-01 ENCOUNTER — Ambulatory Visit: Payer: Medicare Other

## 2020-09-03 IMAGING — CT CT CHEST HIGH RESOLUTION W/O CM
2 of 7 series · 15 of 36 positions shown, 18 images · non-contrast
Comparison: 10/10/2019 chest CT.

CLINICAL DATA: Chronic dyspnea on exertion and nonproductive cough
for 6 months. History of right breast cancer in 2675 status post
mastectomy. Never smoker. Follow-up pulmonary nodules.

EXAM:
CT CHEST WITHOUT CONTRAST
TECHNIQUE: Multidetector CT imaging of the chest was performed following the
standard protocol without intravenous contrast. High resolution
imaging of the lungs, as well as inspiratory and expiratory imaging,
was performed.

[Series 4: thorax 2.00 cor · coronal · 0.55mm/px · 3 of 119 slices shown]
[im 24/119  lung]
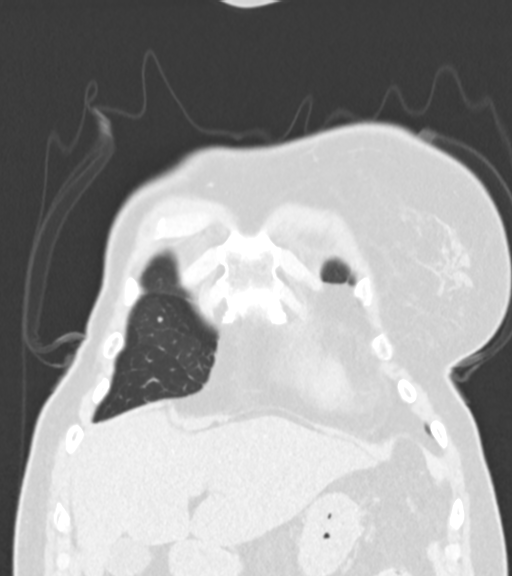
[im 48/119  lung]
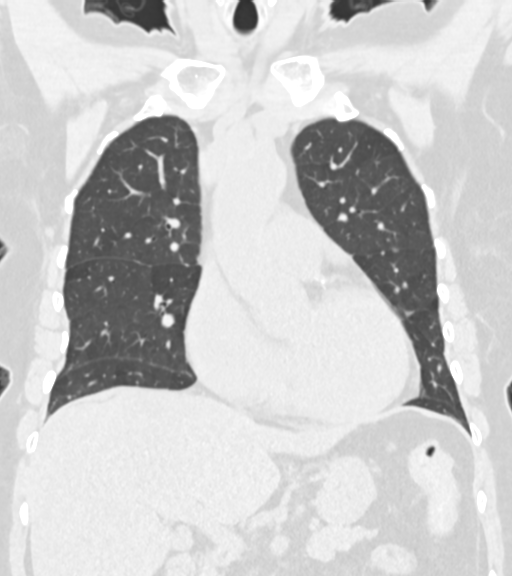
[im 71/119  lung]
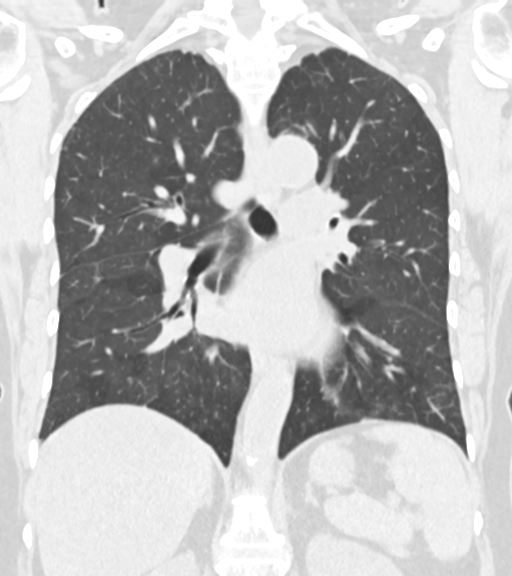

[Series 11: high res (id) thorax 1.00 ax · axial · 0.53mm/px · z∈[-1183,-918]mm · 12 of 315 slices shown, 15 images]
[im 25/315  mediastinal]
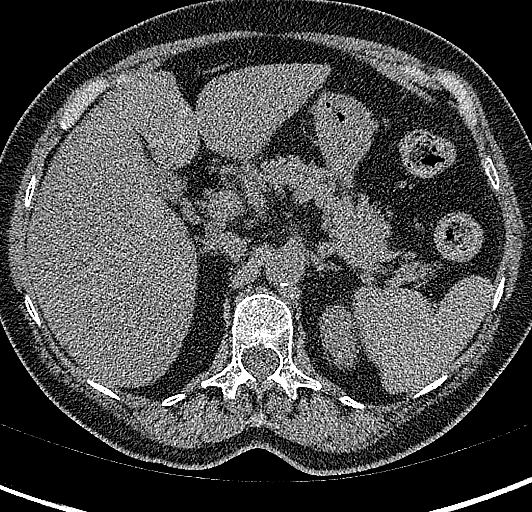
[im 25/315  lung]
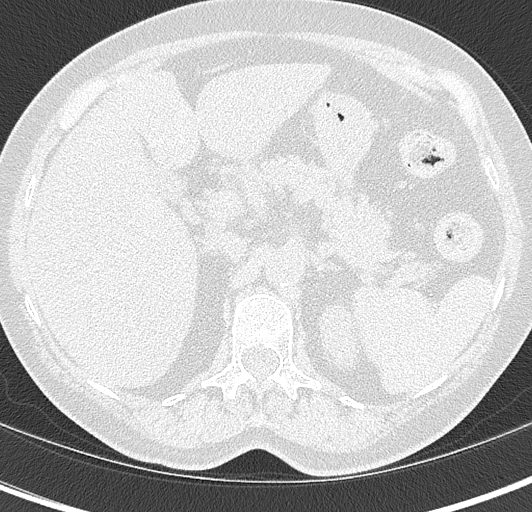
[im 49/315  lung]
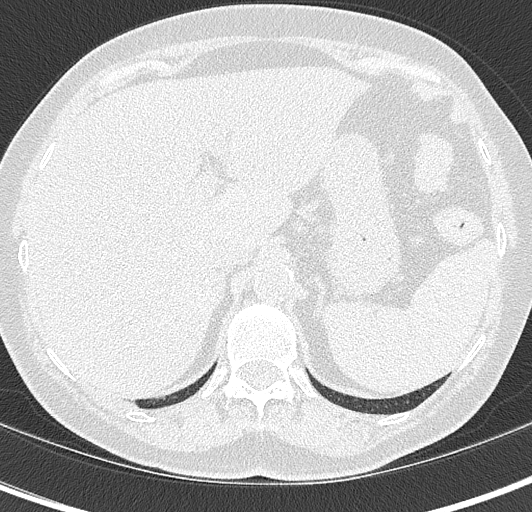
[im 73/315  lung]
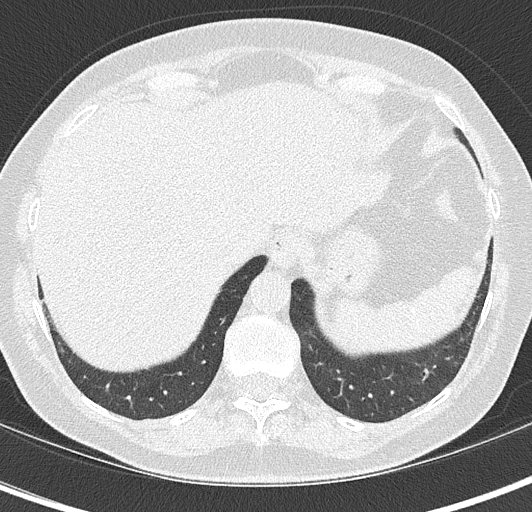
[im 97/315  lung]
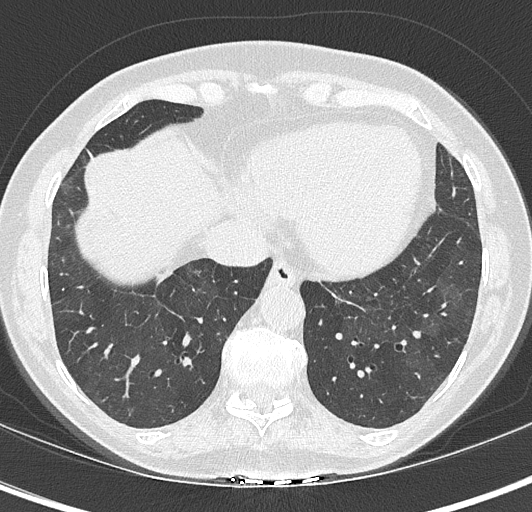
[im 121/315  mediastinal]
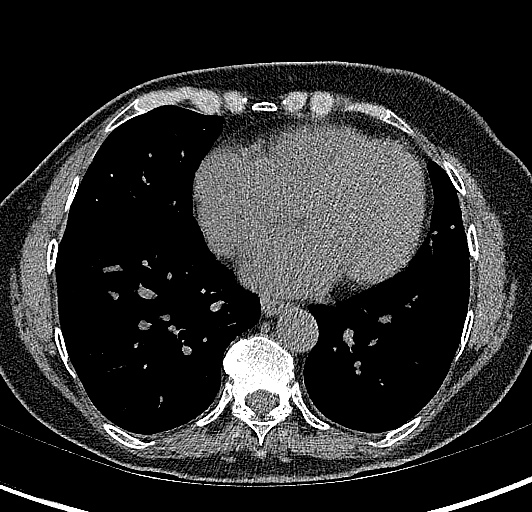
[im 121/315  lung]
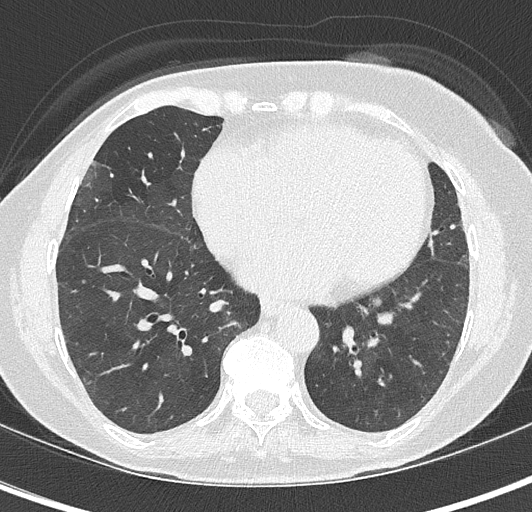
[im 145/315  lung]
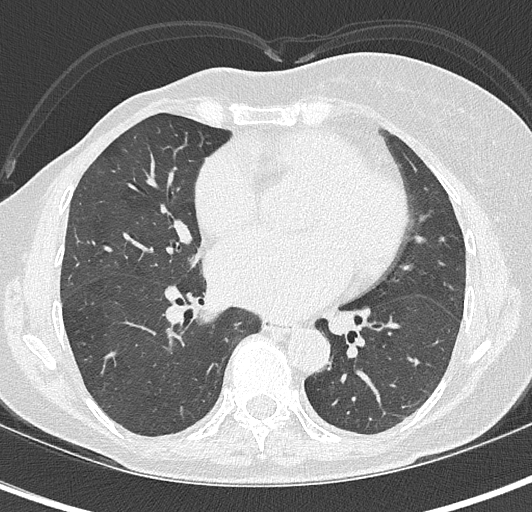
[im 170/315  lung]
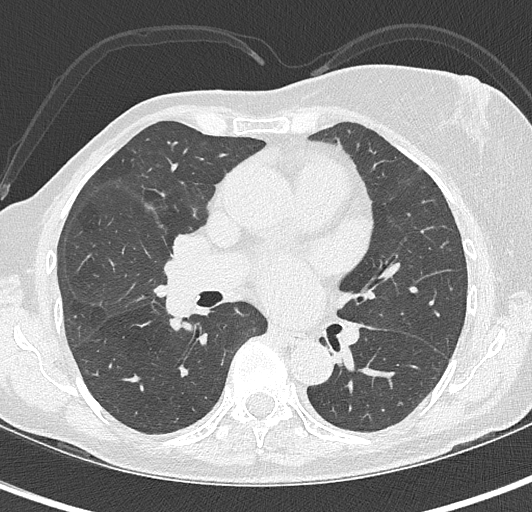
[im 194/315  lung]
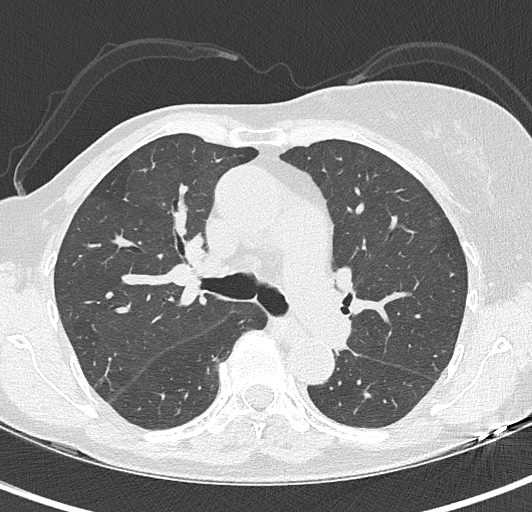
[im 218/315  mediastinal]
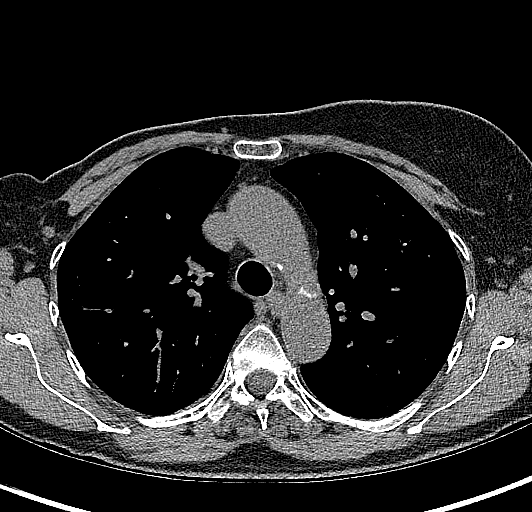
[im 218/315  lung]
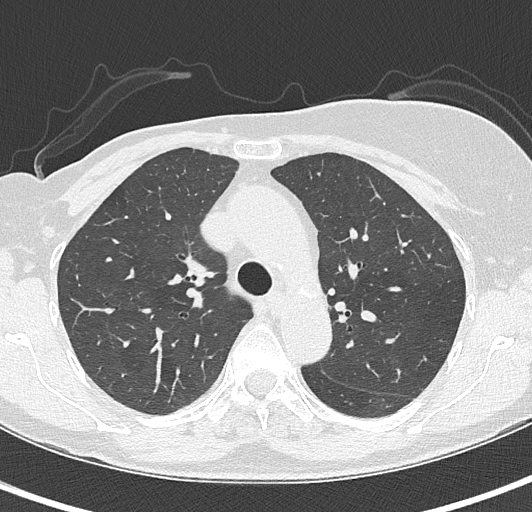
[im 242/315  lung]
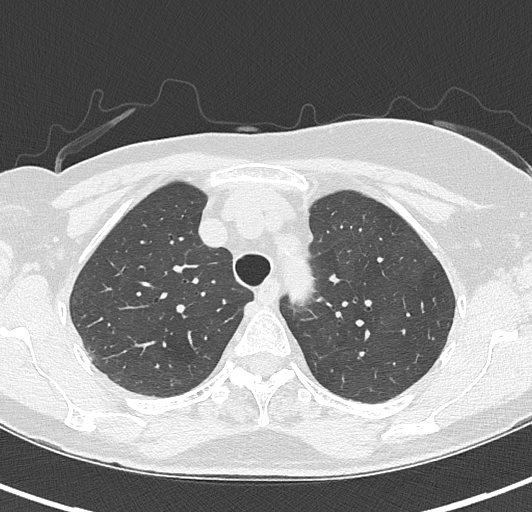
[im 266/315  lung]
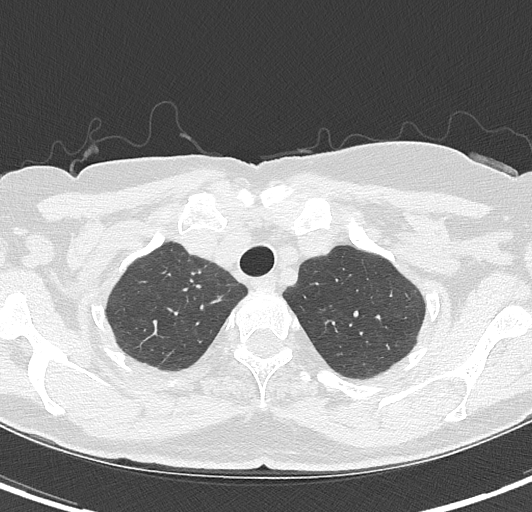
[im 290/315  lung]
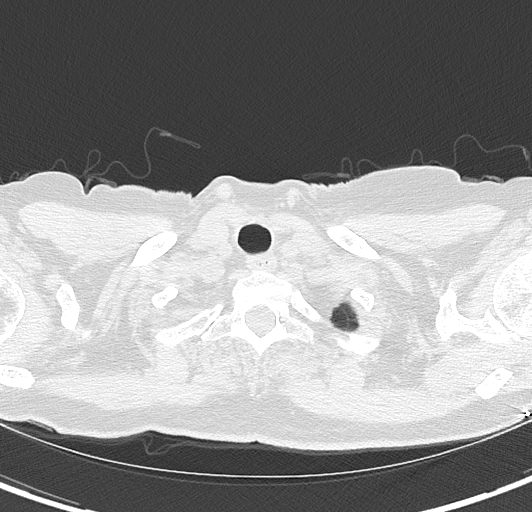

[15 of 36 positions shown; findings below may reference images not displayed]

FINDINGS: Cardiovascular: Top-normal heart size. No significant pericardial
effusion/thickening. Three-vessel coronary atherosclerosis.
Atherosclerotic nonaneurysmal thoracic aorta. Normal caliber
pulmonary arteries.

Mediastinum/Nodes: No discrete thyroid nodules. Unremarkable
esophagus. No pathologically enlarged axillary, mediastinal or hilar
lymph nodes, noting limited sensitivity for the detection of hilar
adenopathy on this noncontrast study.

Lungs/Pleura: No pneumothorax. No pleural effusion. No acute
consolidative airspace disease or lung masses. Several (at least 5)
solid pulmonary nodules scattered in the lungs bilaterally, largest
7 mm in the posterior right middle lobe (series 8/image 77), all
stable since 10/10/2019 chest CT. No new significant pulmonary
nodules. Prominent patchy air trapping throughout both lungs on the
expiration sequence without evidence of tracheobronchomalacia. No
significant regions of subpleural reticulation, ground-glass
attenuation, traction bronchiectasis, architectural distortion or
frank honeycombing.

Upper abdomen: Tiny hiatal hernia.  Mild diffuse hepatic steatosis.

Musculoskeletal: No aggressive appearing focal osseous lesions.
Right mastectomy. Mild thoracic spondylosis.
IMPRESSION: 1. No evidence of interstitial lung disease.
2. Prominent patchy air trapping throughout both lungs, indicative
of small airways disease.
3. Three month stability of scattered solid pulmonary nodules,
reassuring. Suggest continued noncontrast chest CT follow-up in 6
months in this patient with a history of malignancy.
4. Three-vessel coronary atherosclerosis.
5. Tiny hiatal hernia.
6. Mild diffuse hepatic steatosis.
7. Aortic Atherosclerosis ([id number]).

## 2020-09-04 ENCOUNTER — Other Ambulatory Visit: Payer: Self-pay | Admitting: Family Medicine

## 2020-09-09 ENCOUNTER — Ambulatory Visit: Payer: Medicare Other

## 2020-09-15 ENCOUNTER — Ambulatory Visit: Payer: Medicare Other

## 2020-09-21 ENCOUNTER — Ambulatory Visit: Payer: Medicare Other

## 2020-09-23 ENCOUNTER — Ambulatory Visit: Payer: Medicare Other

## 2020-09-28 ENCOUNTER — Ambulatory Visit: Payer: Medicare Other

## 2020-09-30 ENCOUNTER — Ambulatory Visit: Payer: Medicare Other

## 2020-10-01 NOTE — Progress Notes (Signed)
Complete physical exam   Patient: Sandra [name]   DOB: 10/22/1945   75 y.o. Female  MRN: [id number] Visit Date: 10/04/2020  Today's healthcare provider: [name], MD   Chief Complaint  Patient presents with  . Annual Exam   Subjective    Sandra [name] is a 75 y.o. female who presents today for annual weelness and  a complete physical exam.  She reports consuming a general diet. The patient does not participate in regular exercise at present. She generally feels fairly well. She reports sleeping fairly well. She does not have additional problems to discuss today.  HPI  Patient feels she has had almost a complete recovery from her stroke. Patient had AWV with [facility] on 05/04/2020.  Past Medical History:  Diagnosis Date  . (HFpEF) heart failure with preserved ejection fraction ([place])    a. 08/2019 Echo: EF 60-65%, no rwma, mild LVH, Gr2 DD, nl RV fxn. Nl pASP. Mildly dil LA. Mild to mod MR.  [name] Anemia   . Anxiety   . Arthritis   . BRCA gene mutation negative 10/2016   NEGATIVE: [employer]  . Breast cancer ([place]) 10/16/2016   T1c,N0;, GRADE I/III, 1.6cm. ER/PR pos  HER2 not over expressed, Right Upper Outer  . Celiac disease   . Depression   . Dyspnea    WITH EXERTION  . GERD (gastroesophageal reflux disease)    OCC  . Glaucoma    right eye  . Headache    H/O MIGRAINES AS TEENAGER  . Heart murmur    a. 08/2019 Echo: mild to mod MR.  [name] Hypertension   . Hypothyroidism   . Non-obstructive CAD (coronary artery disease)    a. 01/2017 MV: Hypertensive response. No ischemia/infarct. EF >65%; b. 09/2019 Cor CTA: Ca2+ = 9.29 (36th percentile). LAD calcified plaque (0-24%), otw nl. Multipel bilat pulm nodules up to 52m.  .Marland KitchenPAF (paroxysmal atrial fibrillation) ([place]    a. 02/2020 Zio: predominantly RSR, 65 (50-105), rare PACs/PVCs, 8 beats NSVT, multiple episodes of PAF lasting up to 1hr 252ms. Avg AF rate 130 (93-170). AF burden <1%. Triggered events = RSR, PACs, and PAF; b.  CHA2DS2VASc = 6.  . Pneumonia    YEARS AGO  . Pulmonary nodules    a. 09/2019 Cor CTA: incidental finding of multiple bilat pulm nodules; b. 12/2019 High Res CT: stable, scattered solid pulm nodules.  . Raynaud's disease   . RLS (restless legs syndrome)    Past Surgical History:  Procedure Laterality Date  . ABDOMINAL HYSTERECTOMY    . APPENDECTOMY    . AXILLARY LYMPH NODE BIOPSY Right 03/05/2017   Procedure: AXILLARY LYMPH NODE BIOPSY;  Surgeon: ByRobert BellowMD;  Location: [facility] ORS;  Service: General;  Laterality: Right;  . CATARACT EXTRACTION Bilateral   . CESAREAN SECTION    . COLONOSCOPY WITH PROPOFOL N/A 05/19/2015   Procedure: COLONOSCOPY WITH PROPOFOL;  Surgeon: RoManya SilvasMD;  [facility];  Service: Endoscopy;  Laterality: N/A;  . COLONOSCOPY WITH PROPOFOL N/A 08/01/2019   Procedure: COLONOSCOPY WITH PROPOFOL;  Surgeon: WoLucilla LameMD;  [facility];  Service: Endoscopy;  Laterality: N/A;  . EYE SURGERY    . HAND SURGERY Left   . MASTECTOMY Right 11/07/2016   RESIDUAL INVASIVE MAMMARY CARCINOMA, SUBAREOLAR ANTERIOR TO PREVIOUS   . SENTINEL NODE BIOPSY Right 11/07/2016   Procedure: SENTINEL NODE BIOPSY;  Surgeon: ByRobert BellowMD;  Location: [facility] ORS;  Service: General;  Laterality: Right;  . SIMPLE MASTECTOMY WITH AXILLARY SENTINEL NODE BIOPSY Right 11/07/2016   6 mm ER/PR 100%; Her 2 neu not overexpressed, T1b, N0.  Surgeon: Robert Bellow, MD;  Location: [facility] ORS;  Service: General;  Laterality: Right;  . TONSILLECTOMY  AGE 64  . UPPER GI ENDOSCOPY  01/12/06   hiatus hernia   Social History   Socioeconomic History  . Marital status: Married    Spouse name: Nadara Mustard  . Number of children: 2  . Years of education: Not on file  . Highest education level: Some college, no degree  Occupational History  . Occupation: retired  Tobacco Use  . Smoking status: Never Smoker  . Smokeless tobacco: Never Used  Vaping Use  . Vaping  Use: Never used  Substance and Sexual Activity  . Alcohol use: Not Currently  . Drug use: No  . Sexual activity: Not Currently    Birth control/protection: None  Other Topics Concern  . Not on file  Social History Narrative   Lives with Husband   Right handed   Drinks 2-3 cups caffiene daily   Social Determinants of Health   Financial Resource Strain: Low Risk   . Difficulty of Paying Living Expenses: Not hard at all  Food Insecurity: No Food Insecurity  . Worried About Charity fundraiser in the Last Year: Never true  . Ran Out of Food in the Last Year: Never true  Transportation Needs: No Transportation Needs  . Lack of Transportation (Medical): No  . Lack of Transportation (Non-Medical): No  Physical Activity: Inactive  . Days of Exercise per Week: 0 days  . Minutes of Exercise per Session: 0 min  Stress: No Stress Concern Present  . Feeling of Stress : Only a little  Social Connections: Socially Integrated  . Frequency of Communication with Friends and Family: More than three times a week  . Frequency of Social Gatherings with Friends and Family: More than three times a week  . Attends Religious Services: More than 4 times per year  . Active Member of Clubs or Organizations: Yes  . Attends Archivist Meetings: More than 4 times per year  . Marital Status: Married  Human resources officer Violence: Not At Risk  . Fear of Current or Ex-Partner: No  . Emotionally Abused: No  . Physically Abused: No  . Sexually Abused: No   Family Status  Relation Name Status  . Mother  Deceased at age 21  . Father  Deceased at age 33       from complications of the fall  . Sister  Deceased  . Brother  Deceased at age 65       due to [place]  . MGM  Deceased  . MGF  Deceased  . PGF  Deceased   Family History  Problem Relation Age of Onset  . Cancer Mother   . Anemia Mother   . Other Mother        lymphosarcoma  . Alcohol abuse Father   . Prostate cancer Father   . Breast  cancer Sister        94; 1/2 sister, shared mother.  . Cancer Sister        bile duct  . Stroke Maternal Grandmother   . Breast cancer Maternal Grandmother 68  . Heart attack Maternal Grandfather   . CAD Paternal Grandfather    No Known Allergies  Patient Care Team: Jerrol Banana., MD as PCP - General (Family  Medicine) End, Harrell Gave, MD as PCP - Cardiology (Cardiology) Vickie Epley, MD as PCP - Electrophysiology (Cardiology) Leandrew Koyanagi, MD as Referring Physician (Ophthalmology) Bary Castilla, Forest Gleason, MD (General Surgery) Wilhelmina Mcardle., MD (Dentistry) Lucilla Lame, MD as Consulting Physician (Gastroenterology)   Medications: Outpatient Medications Prior to Visit  Medication Sig  . acetaminophen (TYLENOL) 500 MG tablet Take 500-1,000 mg by mouth every 6 (six) hours as needed for mild pain or moderate pain.  [name] ALPRAZolam (XANAX) 0.5 MG tablet TAKE 1 TABLET BY MOUTH AT  BEDTIME  . amLODipine (NORVASC) 10 MG tablet Take 1 tablet (10 mg total) by mouth daily.  [name] atorvastatin (LIPITOR) 40 MG tablet Take 40 mg by mouth daily.  . Calcium Carb-Cholecalciferol 500-400 MG-UNIT TABS Take 1-2 tablets by mouth daily.  . carvedilol (COREG) 6.25 MG tablet Take 1 tablet (6.25 mg total) by mouth 2 (two) times daily with a meal.  . Cholecalciferol (VITAMIN D3) 125 MCG (5000 UT) TABS Take 125 Units by mouth daily.  . hydroxypropyl methylcellulose / hypromellose (ISOPTO TEARS / GONIOVISC) 2.5 % ophthalmic solution Place 1 drop into both eyes 4 (four) times daily as needed for dry eyes.  [name] latanoprost (XALATAN) 0.005 % ophthalmic solution Place 1 drop into both eyes at bedtime.  [name] letrozole (FEMARA) 2.5 MG tablet Take 1 tablet (2.5 mg total) by mouth daily.  [name] levothyroxine (SYNTHROID) 100 MCG tablet TAKE 1 TABLET BY MOUTH 30  MINUTES BEFORE BREAKFAST (Patient taking differently: TAKE 1 TABLET BY MOUTH 30  MINUTES BEFORE BREAKFAST)  . LOW-DOSE ASPIRIN PO Take 81 mg by mouth  daily.  . Multiple Vitamin (MULTIVITAMIN WITH MINERALS) TABS tablet Take 1 tablet by mouth daily at 3 pm.  . omeprazole (PRILOSEC) 40 MG capsule TAKE 1 CAPSULE BY MOUTH  DAILY  . sertraline (ZOLOFT) 100 MG tablet TAKE 1 TABLET BY MOUTH  DAILY  . timolol (TIMOPTIC) 0.5 % ophthalmic solution Place 1 drop into both eyes every morning.  . sennosides-docusate sodium (SENOKOT-S) 8.6-50 MG tablet Take 1 tablet by mouth as needed for constipation. (Patient not taking: Reported on 10/04/2020)   No facility-administered medications prior to visit.    Review of Systems  Constitutional: Negative for appetite change, chills, fatigue and fever.  Respiratory: Negative for chest tightness and shortness of breath.   Cardiovascular: Negative for chest pain and palpitations.  Gastrointestinal: Negative for abdominal pain, nausea and vomiting.  Neurological: Negative for dizziness and weakness.       Objective    BP 125/68   Pulse 62   Resp 16   Wt 138 lb (62.6 kg)   BMI 24.45 kg/m  BP Readings from Last 3 Encounters:  10/04/20 125/68  08/25/20 120/60  08/24/20 114/60   Wt Readings from Last 3 Encounters:  10/04/20 138 lb (62.6 kg)  08/25/20 134 lb (60.8 kg)  08/24/20 133 lb 6 oz (60.5 kg)      Physical Exam Vitals and nursing note reviewed. Exam conducted with a chaperone present.  Constitutional:      Appearance: Normal appearance.  HENT:     Right Ear: Tympanic membrane, ear canal and external ear normal.     Left Ear: Tympanic membrane, ear canal and external ear normal.     Nose: Nose normal.  Eyes:     General: No scleral icterus.    Pupils: Pupils are equal, round, and reactive to light.  Cardiovascular:     Rate and Rhythm: Normal rate and regular rhythm.  Heart sounds: Normal heart sounds.  Pulmonary:     Effort: Pulmonary effort is normal.     Breath sounds: Normal breath sounds.  Chest:  Breasts:     Right: Normal.     Left: Absent.    Abdominal:     General:  Bowel sounds are normal.  Musculoskeletal:     Cervical back: Normal range of motion and neck supple.  Skin:    General: Skin is warm and dry.     Comments: Very fair skinned  Neurological:     Mental Status: She is alert and oriented to person, place, and time. Mental status is at baseline.  Psychiatric:        Mood and Affect: Mood normal.        Behavior: Behavior normal.        Thought Content: Thought content normal.        Judgment: Judgment normal.       Last depression screening scores PHQ 2/9 Scores 11/24/2019 04/29/2019 04/22/2018  PHQ - 2 Score 1 0 0  PHQ- 9 Score 8 - -   Last fall risk screening Fall Risk  05/04/2020  Falls in the past year? 1  Number falls in past yr: 1  Injury with Fall? 0  Comment -  Risk for fall due to : Impaired balance/gait  Follow up Falls prevention discussed   Last Audit-C alcohol use screening Alcohol Use Disorder Test (AUDIT) 05/04/2020  1. How often do you have a drink containing alcohol? 0  2. How many drinks containing alcohol do you have on a typical day when you are drinking? 0  3. How often do you have six or more drinks on one occasion? 0  AUDIT-C Score 0  Alcohol Brief Interventions/Follow-up AUDIT Score <7 follow-up not indicated   A score of 3 or more in women, and 4 or more in men indicates increased risk for alcohol abuse, EXCEPT if all of the points are from question 1   No results found for any visits on 10/04/20.  Assessment & Plan    Routine Health Maintenance and Physical Exam  Exercise Activities and Dietary recommendations Goals    . DIET - INCREASE WATER INTAKE     Recommend increasing water intake to 4-6 glasses a day.   04/22/18- Recommend increasing water intake to 6-8 glasses a day.     . Prevent falls     Recommend to remove any items from the home that may cause slips or trips.       Immunization History  Administered Date(s) Administered  . Influenza, High Dose Seasonal PF 03/04/2015,  04/07/2016, 03/16/2017, 02/09/2018, 02/19/2019, 02/20/2020  . PFIZER(Purple Top)SARS-COV-2 Vaccination 06/18/2019, 07/12/2019, 01/27/2020  . Pneumococcal Conjugate-13 02/05/2014  . Pneumococcal Polysaccharide-23 04/07/2016  . Tdap 03/18/2008    Health Maintenance  Topic Date Due  . COVID-19 Vaccine (4 - Booster for [place] series) 07/26/2020  . TETANUS/TDAP  05/04/2021 (Originally 03/18/2018)  . INFLUENZA VACCINE  12/27/2020  . DEXA SCAN  07/20/2024  . COLONOSCOPY (Pts 45-8yr Insurance coverage will need to be confirmed)  07/31/2024  . Hepatitis C Screening  Completed  . PNA vac Low Risk Adult  Completed  . HPV VACCINES  Aged Out    Discussed health benefits of physical activity, and encouraged her to engage in regular exercise appropriate for her age and condition.  1. Encounter for Medicare annual wellness exam   2. Annual physical exam   3. MCI (mild cognitive impairment) MMSE later  in the year.  She has improved since her stroke.   No follow-ups on file.     I, [name], MD, have reviewed all documentation for this visit. The documentation on 10/12/20 for the exam, diagnosis, procedures, and orders are all accurate and complete.    Odyn Turko Cranford Mon, MD  [facility] [phone] (phone) [phone] (fax)  [place]

## 2020-10-04 ENCOUNTER — Encounter: Payer: Self-pay | Admitting: Family Medicine

## 2020-10-04 ENCOUNTER — Ambulatory Visit (INDEPENDENT_AMBULATORY_CARE_PROVIDER_SITE_OTHER): Payer: Medicare Other | Admitting: Family Medicine

## 2020-10-04 ENCOUNTER — Other Ambulatory Visit: Payer: Self-pay

## 2020-10-04 VITALS — BP 125/68 | HR 62 | Resp 16 | Wt 138.0 lb

## 2020-10-04 DIAGNOSIS — Z0000 Encounter for general adult medical examination without abnormal findings: Secondary | ICD-10-CM

## 2020-10-04 DIAGNOSIS — G3184 Mild cognitive impairment, so stated: Secondary | ICD-10-CM

## 2020-10-05 ENCOUNTER — Ambulatory Visit: Payer: Medicare Other

## 2020-10-07 ENCOUNTER — Ambulatory Visit: Payer: Medicare Other

## 2020-10-08 ENCOUNTER — Other Ambulatory Visit: Payer: Self-pay | Admitting: Family Medicine

## 2020-10-12 ENCOUNTER — Ambulatory Visit: Payer: Medicare Other

## 2020-10-14 ENCOUNTER — Other Ambulatory Visit: Payer: Self-pay | Admitting: General Surgery

## 2020-10-14 ENCOUNTER — Ambulatory Visit: Payer: Medicare Other

## 2020-10-14 DIAGNOSIS — C50911 Malignant neoplasm of unspecified site of right female breast: Secondary | ICD-10-CM

## 2020-10-17 ENCOUNTER — Other Ambulatory Visit: Payer: Self-pay | Admitting: Family Medicine

## 2020-10-17 DIAGNOSIS — I10 Essential (primary) hypertension: Secondary | ICD-10-CM

## 2020-10-17 NOTE — Telephone Encounter (Signed)
Change of pharmacy Requested Prescriptions  Pending Prescriptions Disp Refills  . amLODipine (NORVASC) 10 MG tablet [Pharmacy Med Name: AMLODIPINE BESYLATE 10 MG TAB] 90 tablet 1    Sig: TAKE 1 TABLET BY MOUTH EVERY DAY     Cardiovascular:  Calcium Channel Blockers Passed - 10/17/2020  9:34 AM      Passed - Last BP in normal range    BP Readings from Last 1 Encounters:  10/04/20 125/68         Passed - Valid encounter within last 6 months    Recent Outpatient Visits          1 week ago Encounter for [employer] annual wellness exam   [facility] Jerrol Banana., MD   2 months ago Cerebellar hemorrhage, acute [facility])   [facility] Jerrol Banana., MD   2 months ago Hemorrhagic stroke [facility])   [facility] Jerrol Banana., MD   3 months ago [place] Jerrol Banana., MD   9 months ago Celiac disease   [facility] Jerrol Banana., MD      Future Appointments            In 1 month End, Harrell Gave, MD [facility], [place]   In 3 months Jerrol Banana., MD [facility], [place]   In 3 months Vickie Epley, MD [facility], [place]

## 2020-10-18 ENCOUNTER — Other Ambulatory Visit: Payer: Self-pay | Admitting: Family Medicine

## 2020-10-18 NOTE — Telephone Encounter (Signed)
Requested medications are due for refill today.  unknown  Requested medications are on the active medications list.  yes  Last refill. 07/01/2020  Future visit scheduled.   yes  Notes to clinic.  Note states that medication was dc'd 07/01/2020. RX expired on 07/01/2020. Please advise.

## 2020-10-19 ENCOUNTER — Ambulatory Visit: Payer: Medicare Other

## 2020-10-20 ENCOUNTER — Telehealth: Payer: Self-pay | Admitting: Internal Medicine

## 2020-10-20 ENCOUNTER — Emergency Department: Payer: Medicare Other

## 2020-10-20 ENCOUNTER — Other Ambulatory Visit: Payer: Self-pay

## 2020-10-20 ENCOUNTER — Emergency Department
Admission: EM | Admit: 2020-10-20 | Discharge: 2020-10-20 | Disposition: A | Discharge status: Home / Self Care | Payer: Medicare Other | Attending: Emergency Medicine | Admitting: Emergency Medicine

## 2020-10-20 DIAGNOSIS — I110 Hypertensive heart disease with heart failure: Secondary | ICD-10-CM | POA: Insufficient documentation

## 2020-10-20 DIAGNOSIS — E039 Hypothyroidism, unspecified: Secondary | ICD-10-CM | POA: Insufficient documentation

## 2020-10-20 DIAGNOSIS — Z79899 Other long term (current) drug therapy: Secondary | ICD-10-CM | POA: Diagnosis not present

## 2020-10-20 DIAGNOSIS — I480 Paroxysmal atrial fibrillation: Secondary | ICD-10-CM | POA: Diagnosis not present

## 2020-10-20 DIAGNOSIS — R911 Solitary pulmonary nodule: Secondary | ICD-10-CM | POA: Diagnosis not present

## 2020-10-20 DIAGNOSIS — Z85828 Personal history of other malignant neoplasm of skin: Secondary | ICD-10-CM | POA: Insufficient documentation

## 2020-10-20 DIAGNOSIS — J9811 Atelectasis: Secondary | ICD-10-CM | POA: Diagnosis not present

## 2020-10-20 DIAGNOSIS — R42 Dizziness and giddiness: Secondary | ICD-10-CM | POA: Diagnosis not present

## 2020-10-20 DIAGNOSIS — I5032 Chronic diastolic (congestive) heart failure: Secondary | ICD-10-CM | POA: Diagnosis not present

## 2020-10-20 DIAGNOSIS — R002 Palpitations: Secondary | ICD-10-CM | POA: Diagnosis not present

## 2020-10-20 DIAGNOSIS — Z853 Personal history of malignant neoplasm of breast: Secondary | ICD-10-CM | POA: Diagnosis not present

## 2020-10-20 LAB — CBC WITH DIFFERENTIAL/PLATELET
Abs Immature Granulocytes: 0.02 10*3/uL (ref 0.00–0.07)
Basophils Absolute: 0 10*3/uL (ref 0.0–0.1)
Basophils Relative: 1 %
Eosinophils Absolute: 0.1 10*3/uL (ref 0.0–0.5)
Eosinophils Relative: 1 %
HCT: 38.2 % (ref 36.0–46.0)
Hemoglobin: 12.7 g/dL (ref 12.0–15.0)
Immature Granulocytes: 0 %
Lymphocytes Relative: 36 %
Lymphs Abs: 2.2 10*3/uL (ref 0.7–4.0)
MCH: 29.1 pg (ref 26.0–34.0)
MCHC: 33.2 g/dL (ref 30.0–36.0)
MCV: 87.6 fL (ref 80.0–100.0)
Monocytes Absolute: 0.6 10*3/uL (ref 0.1–1.0)
Monocytes Relative: 10 %
Neutro Abs: 3 10*3/uL (ref 1.7–7.7)
Neutrophils Relative %: 52 %
Platelets: 264 10*3/uL (ref 150–400)
RBC: 4.36 MIL/uL (ref 3.87–5.11)
RDW: 13.6 % (ref 11.5–15.5)
WBC: 5.9 10*3/uL (ref 4.0–10.5)
nRBC: 0 % (ref 0.0–0.2)

## 2020-10-20 LAB — COMPREHENSIVE METABOLIC PANEL
ALT: 31 U/L (ref 0–44)
AST: 27 U/L (ref 15–41)
Albumin: 4.4 g/dL (ref 3.5–5.0)
Alkaline Phosphatase: 81 U/L (ref 38–126)
Anion gap: 9 (ref 5–15)
BUN: 11 mg/dL (ref 8–23)
CO2: 27 mmol/L (ref 22–32)
Calcium: 8.8 mg/dL — ABNORMAL LOW (ref 8.9–10.3)
Chloride: 104 mmol/L (ref 98–111)
Creatinine, Ser: 0.57 mg/dL (ref 0.44–1.00)
GFR, Estimated: >60 mL/min (ref >60)
Glucose, Bld: 106 mg/dL — ABNORMAL HIGH (ref 70–99)
Potassium: 3.2 mmol/L — ABNORMAL LOW (ref 3.5–5.1)
Sodium: 140 mmol/L (ref 135–145)
Total Bilirubin: 0.7 mg/dL (ref 0.3–1.2)
Total Protein: 7.6 g/dL (ref 6.5–8.1)

## 2020-10-20 LAB — BRAIN NATRIURETIC PEPTIDE: B Natriuretic Peptide: 243 pg/mL — ABNORMAL HIGH (ref 0.0–100.0)

## 2020-10-20 LAB — TROPONIN I (HIGH SENSITIVITY)
Troponin I (High Sensitivity): 6 ng/L (ref <18)
Troponin I (High Sensitivity): 6 ng/L (ref <18)

## 2020-10-20 LAB — MAGNESIUM: Magnesium: 2.3 mg/dL (ref 1.7–2.4)

## 2020-10-20 MED ORDER — DILTIAZEM HCL-DEXTROSE 125-5 MG/125ML-% IV SOLN (PREMIX)
5.0000 mg/h | INTRAVENOUS | Status: DC
  Filled 2020-10-20: qty 125

## 2020-10-20 MED ORDER — DILTIAZEM LOAD VIA INFUSION
10.0000 mg | Freq: Once | INTRAVENOUS | Status: DC
  Filled 2020-10-20: qty 10

## 2020-10-20 NOTE — Telephone Encounter (Signed)
Incoming call from Mr. Mckinnie, she reports lightheadedness and dizziness similar when she had a hemorraghic stroke back in Feb 2022 after starting Eliquis Oct 2021. After stroke, pt was taken off Eliquis and only takes 81 mg ASA.  Reports she noted she was in Afib around 12:15 today, HR 113 and BP 116/79, denies CP or shob.no weakness or slurred speech.  Mrs. Mcgann reports she is very concern as this is similar feelings as to when she had her stroke a few months ago, reports she is currently headed to the ED for an evaluation. Wanted to let Dr. [name] know her current situation so he can follow her chart and her stay in the ED.   Advised Mrs. Harlacher that will route Dr. [name] a message for [facility] and stated the ED provider will touch based with Dr. [name] and if cardiology needed to be consulted during her stay, one of the on-call providers will be able to evaluate her and will f/u w/Dr. End.  Mrs. Nase is thankful for being able to speak with nurse and will continue to ED at this time

## 2020-10-20 NOTE — ED Provider Notes (Signed)
[facility] Emergency Department Provider Note   ____________________________________________   Event Date/Time   First MD Initiated Contact with Patient 10/20/20 1457     (approximate)  I have reviewed the triage vital signs and the nursing notes.   HISTORY  Chief Complaint Dizziness and Palpitations    HPI Sandra [name] is a 75 y.o. female with the below stated past medical history and significant for paroxysmal atrial fibrillation who presents for palpitations, lightheadedness, and and chest tightness that began today and has been stable since onset.  Patient states these are similar symptoms to when she has been in atrial fibrillation before and had a home monitor tell her that she was in atrial fibrillation now.  Patient states that she is currently on carvedilol and amlodipine for her paroxysmal atrial fibrillation and has taken all of her medications on time and as prescribed today.  Of note, patient was recently taken off of her anticoagulation (Eliquis) after a hemorrhagic stroke in 06/2020.  Patient has been taking 81 mg aspirin daily.  Patient currently denies any vision changes, tinnitus, difficulty speaking, facial droop, sore throat, abdominal pain, nausea/vomiting/diarrhea, dysuria, or weakness/numbness/paresthesias in any extremity         Past Medical History:  Diagnosis Date  . (HFpEF) heart failure with preserved ejection fraction ([place])    a. 08/2019 Echo: EF 60-65%, no rwma, mild LVH, Gr2 DD, nl RV fxn. Nl pASP. Mildly dil LA. Mild to mod MR.  [name] Anemia   . Anxiety   . Arthritis   . BRCA gene mutation negative 10/2016   NEGATIVE: [employer]  . Breast cancer ([place]) 10/16/2016   T1c,N0;, GRADE I/III, 1.6cm. ER/PR pos  HER2 not over expressed, Right Upper Outer  . Celiac disease   . Depression   . Dyspnea    WITH EXERTION  . GERD (gastroesophageal reflux disease)    OCC  . Glaucoma    right eye  . Headache    H/O MIGRAINES AS TEENAGER   . Heart murmur    a. 08/2019 Echo: mild to mod MR.  [name] Hypertension   . Hypothyroidism   . Non-obstructive CAD (coronary artery disease)    a. 01/2017 MV: Hypertensive response. No ischemia/infarct. EF >65%; b. 09/2019 Cor CTA: Ca2+ = 9.29 (36th percentile). LAD calcified plaque (0-24%), otw nl. Multipel bilat pulm nodules up to 25m.  .Marland KitchenPAF (paroxysmal atrial fibrillation) ([place]    a. 02/2020 Zio: predominantly RSR, 65 (50-105), rare PACs/PVCs, 8 beats NSVT, multiple episodes of PAF lasting up to 1hr 243ms. Avg AF rate 130 (93-170). AF burden <1%. Triggered events = RSR, PACs, and PAF; b. CHA2DS2VASc = 6.  . Pneumonia    YEARS AGO  . Pulmonary nodules    a. 09/2019 Cor CTA: incidental finding of multiple bilat pulm nodules; b. 12/2019 High Res CT: stable, scattered solid pulm nodules.  . Raynaud's disease   . RLS (restless legs syndrome)     Patient Active Problem List   Diagnosis Date Noted  . Cerebellar hemorrhage, acute (HCMorton02/19/2022  . Hemorrhagic cerebrovascular accident (CVA) (HCT[place]02/18/2022  . PAF (paroxysmal atrial fibrillation) ([place]CA[place]  . Personal history of colonic polyps   . Chronic diastolic heart failure (HCElmira12/20/2018  . Essential hypertension 05/17/2017  . Glaucoma (increased eye pressure) 02/06/2017  . Invasive ductal carcinoma of breast, female, right (HC[facility]air[place]05/25/2018  . Prolapse of vaginal vault after hysterectomy 08/24/2016  . Vaginal atrophy 08/24/2016  . Dyspareunia in female 08/24/2016  .  Colon polyps 01/06/2015  . BCC (basal cell carcinoma of skin) 01/06/2015  . Hypothyroidism 01/06/2015  . Vitamin D deficiency 01/06/2015  . Hyperlipidemia 01/06/2015  . Depression, major 01/06/2015  . GAD (generalized anxiety disorder) 01/06/2015  . RLS (restless legs syndrome) 01/06/2015  . Cataract 01/06/2015  . Raynaud phenomenon 01/06/2015  . GERD (gastroesophageal reflux disease) 01/06/2015  . Celiac disease 01/06/2015  . OA (osteoarthritis) 01/06/2015  .  Osteoporosis 01/06/2015  . Pre-diabetes 01/06/2015  . Avitaminosis D 01/06/2015  . Raynaud's syndrome without gangrene 01/06/2015  . Age-related osteoporosis without current pathological fracture 01/06/2015  . Anxiety disorder 01/05/2015    Past Surgical History:  Procedure Laterality Date  . ABDOMINAL HYSTERECTOMY    . APPENDECTOMY    . AXILLARY LYMPH NODE BIOPSY Right 03/05/2017   Procedure: AXILLARY LYMPH NODE BIOPSY;  Surgeon: Robert Bellow, MD;  Location: [facility] ORS;  Service: General;  Laterality: Right;  . CATARACT EXTRACTION Bilateral   . CESAREAN SECTION    . COLONOSCOPY WITH PROPOFOL N/A 05/19/2015   Procedure: COLONOSCOPY WITH PROPOFOL;  Surgeon: Manya Silvas, MD;  Location: [facility] ENDOSCOPY;  Service: Endoscopy;  Laterality: N/A;  . COLONOSCOPY WITH PROPOFOL N/A 08/01/2019   Procedure: COLONOSCOPY WITH PROPOFOL;  Surgeon: Lucilla Lame, MD;  Location: [facility] ENDOSCOPY;  Service: Endoscopy;  Laterality: N/A;  . EYE SURGERY    . HAND SURGERY Left   . MASTECTOMY Right 11/07/2016   RESIDUAL INVASIVE MAMMARY CARCINOMA, SUBAREOLAR ANTERIOR TO PREVIOUS   . SENTINEL NODE BIOPSY Right 11/07/2016   Procedure: SENTINEL NODE BIOPSY;  Surgeon: Robert Bellow, MD;  Location: [facility] ORS;  Service: General;  Laterality: Right;  . SIMPLE MASTECTOMY WITH AXILLARY SENTINEL NODE BIOPSY Right 11/07/2016   6 mm ER/PR 100%; Her 2 neu not overexpressed, T1b, N0.  Surgeon: Robert Bellow, MD;  Location: [facility] ORS;  Service: General;  Laterality: Right;  . TONSILLECTOMY  AGE 65  . UPPER GI ENDOSCOPY  01/12/06   hiatus hernia    Prior to Admission medications   Medication Sig Start Date End Date Taking? Authorizing Provider  acetaminophen (TYLENOL) 500 MG tablet Take 500-1,000 mg by mouth every 6 (six) hours as needed for mild pain or moderate pain.    [provider]  ALPRAZolam Duanne Moron) 0.5 MG tablet TAKE 1 TABLET BY MOUTH AT  BEDTIME 07/08/20   Jerrol Banana., MD  amLODipine  (NORVASC) 10 MG tablet TAKE 1 TABLET BY MOUTH EVERY DAY 10/17/20   Jerrol Banana., MD  atorvastatin (LIPITOR) 40 MG tablet Take 40 mg by mouth daily.    [provider]  Calcium Carb-Cholecalciferol 500-400 MG-UNIT TABS Take 1-2 tablets by mouth daily.    [provider]  carvedilol (COREG) 3.125 MG tablet TAKE 1 TABLET BY MOUTH  TWICE DAILY 10/19/20   Jerrol Banana., MD  carvedilol (COREG) 6.25 MG tablet Take 1 tablet (6.25 mg total) by mouth 2 (two) times daily with a meal. 07/01/20   Jerrol Banana., MD  Cholecalciferol (VITAMIN D3) 125 MCG (5000 UT) TABS Take 125 Units by mouth daily.    [provider]  hydroxypropyl methylcellulose / hypromellose (ISOPTO TEARS / GONIOVISC) 2.5 % ophthalmic solution Place 1 drop into both eyes 4 (four) times daily as needed for dry eyes.    [provider]  latanoprost (XALATAN) 0.005 % ophthalmic solution Place 1 drop into both eyes at bedtime.    [provider]  letrozole (FEMARA) 2.5 MG  tablet Take 1 tablet (2.5 mg total) by mouth daily. 09/23/18   Robert Bellow, MD  levothyroxine (SYNTHROID) 100 MCG tablet TAKE 1 TABLET BY MOUTH 30  MINUTES BEFORE BREAKFAST 10/08/20   Jerrol Banana., MD  LOW-DOSE ASPIRIN PO Take 81 mg by mouth daily. 08/04/20   [provider]  Multiple Vitamin (MULTIVITAMIN WITH MINERALS) TABS tablet Take 1 tablet by mouth daily at 3 pm.    [provider]  omeprazole (PRILOSEC) 40 MG capsule TAKE 1 CAPSULE BY MOUTH  DAILY 08/04/20   Jerrol Banana., MD  sennosides-docusate sodium (SENOKOT-S) 8.6-50 MG tablet Take 1 tablet by mouth as needed for constipation. Patient not taking: Reported on 10/04/2020    [provider]  sertraline (ZOLOFT) 100 MG tablet TAKE 1 TABLET BY MOUTH  DAILY 08/19/20   Jerrol Banana., MD  timolol (TIMOPTIC) 0.5 % ophthalmic solution Place 1 drop into both eyes every morning. 04/20/20   [provider]    Allergies Patient has no known allergies.  Family History  Problem Relation Age of Onset  . Cancer Mother   . Anemia Mother   . Other Mother        lymphosarcoma  . Alcohol abuse Father   . Prostate cancer Father   . Breast cancer Sister        35; 1/2 sister, shared mother.  . Cancer Sister        bile duct  . Stroke Maternal Grandmother   . Breast cancer Maternal Grandmother 68  . Heart attack Maternal Grandfather   . CAD Paternal Grandfather     Social History Social History   Tobacco Use  . Smoking status: Never Smoker  . Smokeless tobacco: Never Used  Vaping Use  . Vaping Use: Never used  Substance Use Topics  . Alcohol use: Not Currently  . Drug use: No    Review of Systems Constitutional: No fever/chills Eyes: No visual changes. ENT: No sore throat. Cardiovascular: Endorses palpitations and chest tightness Respiratory: Denies shortness of breath. Gastrointestinal: No abdominal pain.  No nausea, no vomiting.  No diarrhea. Genitourinary: Negative for dysuria. Musculoskeletal: Negative for acute arthralgias Skin: Negative for rash. Neurological: Negative for headaches, weakness/numbness/paresthesias in any extremity Psychiatric: Negative for suicidal ideation/homicidal ideation   ____________________________________________   PHYSICAL EXAM:  VITAL SIGNS: ED Triage Vitals  Enc Vitals Group     BP 10/20/20 1425 (!) 135/55     Pulse Rate 10/20/20 1425 (!) 126     Resp 10/20/20 1425 18     Temp 10/20/20 1425 98.1 F (36.7 C)     Temp Source 10/20/20 1425 Oral     SpO2 10/20/20 1425 98 %     Weight 10/20/20 1425 135 lb (61.2 kg)     Height 10/20/20 1425 5' 3"  (1.6 m)     Head Circumference --      Peak Flow --      Pain Score 10/20/20 1424 0     Pain Loc --      Pain Edu? --      Excl. in [place]? --    Constitutional: Alert and oriented. Well appearing and in no acute distress. Eyes: Conjunctivae are normal. PERRL. Head:  Atraumatic. Nose: No congestion/rhinnorhea. Mouth/Throat: Mucous membranes are moist. Neck: No stridor Cardiovascular: Tachycardic irregularly irregular rhythm.  Grossly normal heart sounds.  Good peripheral circulation. Respiratory: Normal respiratory effort.  No retractions. Gastrointestinal: Soft and nontender. No distention. Musculoskeletal: No  obvious deformities Neurologic:  Normal speech and language. No gross focal neurologic deficits are appreciated. Skin:  Skin is warm and dry. No rash noted. Psychiatric: Mood and affect are normal. Speech and behavior are normal.  ____________________________________________   LABS (all labs ordered are listed, but only abnormal results are displayed)  Labs Reviewed  COMPREHENSIVE METABOLIC PANEL - Abnormal; Notable for the following components:      Result Value   Potassium 3.2 (*)    Glucose, Bld 106 (*)    Calcium 8.8 (*)    All other components within normal limits  BRAIN NATRIURETIC PEPTIDE - Abnormal; Notable for the following components:   B Natriuretic Peptide 243.0 (*)    All other components within normal limits  CBC WITH DIFFERENTIAL/PLATELET  MAGNESIUM  TROPONIN I (HIGH SENSITIVITY)  TROPONIN I (HIGH SENSITIVITY)   ____________________________________________  EKG  ED ECG REPORT I, Naaman Plummer, the attending physician, personally viewed and interpreted this ECG.  Date: 10/20/2020 EKG Time: 1423 Rate: 109 Rhythm: Atrial fibrillation with rapid ventricular response QRS Axis: normal Intervals: normal ST/T Wave abnormalities: normal Narrative Interpretation: no evidence of acute ischemia  ____________________________________________  RADIOLOGY  ED MD interpretation: Single view portable x-ray of the chest shows subsegmental right lung base atelectasis without any evidence of infiltrate or edema  CT of the head without contrast shows no evidence of acute abnormalities including no intracerebral hemorrhage,  obvious masses, or significant edema.  There is evidence of expected encephalomalacia at the site of prior hemorrhage  Official radiology report(s): CT Head Wo Contrast  Result Date: 10/20/2020 CLINICAL DATA:  Head trauma, minor (Age >= 65y) Intermittent dizziness.  Hemorrhagic stroke in March. EXAM: CT HEAD WITHOUT CONTRAST TECHNIQUE: Contiguous axial images were obtained from the base of the skull through the vertex without intravenous contrast. COMPARISON:  Most recent head CT 07/27/2020 FINDINGS: Brain: No residual blood products within left cerebellar hemorrhage from prior, there is faint encephalomalacia in this region. No acute intracranial hemorrhage. No mass effect or midline shift. Stable atrophy. Stable chronic small vessel ischemia. No hydrocephalus. The basilar cisterns are patent. No evidence of territorial infarct or acute ischemia. No extra-axial or intracranial fluid collection. Vascular: No hyperdense vessel or unexpected calcification. Mild atherosclerosis of skull base vasculature. Skull: No fracture or focal lesion. Sinuses/Orbits: Paranasal sinuses and mastoid air cells are clear. The visualized orbits are unremarkable. Bilateral lens extraction Other: None. IMPRESSION: 1. No acute intracranial abnormality. No skull fracture. 2. Minimal encephalomalacia in the left cerebellum at site of prior hemorrhage. Generalized atrophy and chronic small vessel ischemia are unchanged. Electronically Signed   By: Keith Rake M.D.   On: 10/20/2020 18:57   DG Chest Portable 1 View  Result Date: 10/20/2020 CLINICAL DATA:  Palpitations.  Dizziness. EXAM: PORTABLE CHEST 1 VIEW COMPARISON:  Radiograph 06/24/2018.  CT 07/27/2020 FINDINGS: The cardiomediastinal contours are normal. Aortic atherosclerosis. Small pulmonary nodules on CT are not well seen by radiograph. Subsegmental right lung base atelectasis. Pulmonary vasculature is normal. No consolidation, pleural effusion, or pneumothorax. No acute  osseous abnormalities are seen. IMPRESSION: Subsegmental right lung base atelectasis. Electronically Signed   By: Keith Rake M.D.   On: 10/20/2020 15:07    ____________________________________________   PROCEDURES  Procedure(s) performed (including Critical Care):  .1-3 Lead EKG Interpretation Performed by: Naaman Plummer, MD Authorized by: Naaman Plummer, MD     Interpretation: normal     ECG rate:  60   ECG rate assessment: normal  Rhythm: sinus rhythm     Ectopy: none     Conduction: normal       ____________________________________________   INITIAL IMPRESSION / ASSESSMENT AND PLAN / ED COURSE  As part of my medical decision making, I reviewed the following data within the [place] notes reviewed and incorporated, Labs reviewed, EKG interpreted, Old chart reviewed, Radiograph reviewed and Notes from prior ED visits reviewed and incorporated     + atrial fibrillation w/ RVR DDx: Pneumothorax, Pneumonia, Pulmonary Embolus, Tamponade, ACS, Thyrotoxicosis.  No history or evidence decompensated heart failure. Given their history and exam it is likely this patient is unlikely to spontaneously revert to a rate controlled rhythm and necessitates a thorough workup for their arrhythmia. Workup: ECG, CXR, CBC, BMP, UA, Troponin, BNP, TSH, Ca-Mag-Phos Interventions: Defer Cardioversion (uncertain historical reliability with time of onset, increased risk of thromboembolic stroke).  Before administration of any antiarrhythmic medications, patient converted back to normal sinus rhythm  Reassessment: Patient maintained NSR during multi-hour observation in ED. Disposition: Discharge home with prompt PCP follow up and cardiology referral.      ____________________________________________   FINAL CLINICAL IMPRESSION(S) / ED DIAGNOSES  Final diagnoses:  Paroxysmal atrial fibrillation with rapid ventricular response [facility])     ED Discharge  Orders    None       Note:  This document was prepared using Dragon voice recognition software and may include unintentional dictation errors.   Naaman Plummer, MD 10/20/20 [place]

## 2020-10-20 NOTE — ED Triage Notes (Signed)
Pt c/o intermittently having dizziness with heart palpitations today, pt has a recent hx of hemorraghic stroke in march while on eliquis

## 2020-10-20 NOTE — ED Notes (Signed)
pts daughter stated she noticed slurred speech past 2 days , neg fast no slurring speech noted by ed rn , none noticed by pts husband, ed md made aware.

## 2020-10-20 NOTE — Telephone Encounter (Signed)
Patient c/o Palpitations:  High priority if patient c/o lightheadedness, shortness of breath, or chest pain  1) How long have you had palpitations/irregular HR/ Afib? Are you having the symptoms now? Within the hour  2) Are you currently experiencing lightheadedness, SOB or CP? Lightheaded and SOB  3) Do you have a history of afib (atrial fibrillation) or irregular heart rhythm? Yes, a fib  4) Have you checked your BP or HR? (document readings if available): 116/79 113 at 12:25 pm today  5) Are you experiencing any other symptoms? no  On the way to ED

## 2020-10-21 ENCOUNTER — Ambulatory Visit: Payer: Medicare Other

## 2020-10-22 ENCOUNTER — Telehealth: Payer: Self-pay | Admitting: Internal Medicine

## 2020-10-22 MED ORDER — CARVEDILOL 6.25 MG PO TABS
6.2500 mg | ORAL_TABLET | Freq: Two times a day (BID) | ORAL | 1 refills | Status: DC
Start: 2020-10-22 — End: 2020-12-15

## 2020-10-22 NOTE — Telephone Encounter (Signed)
Patient calling  Needs to clarify which dosage is correct to take for carvedilol  She has 6.25 MG 1 tablet 2 times a day and 3.125 MG 2 tablets 2 times a day  Please call to discuss

## 2020-10-22 NOTE — Telephone Encounter (Signed)
Spoke with the patient. Patient sts that she received a refill of her Carvedilol at the incorrect dosage of 3.125 mg bid. It was refilled by her pcps office.  Confirmed with the patient that she should be taking Carvedilol 6.25 mg twice a day based on Cardiology's documentation and her recent ED visit notes.  Adv the patient that she could use up the 3.125 mg tabs by taking 2 tab bid. The correct Rx has been sent to [facility].  Patient voiced appreciation for the assistance.

## 2020-10-26 DIAGNOSIS — H401131 Primary open-angle glaucoma, bilateral, mild stage: Secondary | ICD-10-CM | POA: Diagnosis not present

## 2020-11-01 ENCOUNTER — Telehealth: Payer: Self-pay | Admitting: Internal Medicine

## 2020-11-01 NOTE — Telephone Encounter (Signed)
Pt c/o swelling: STAT is pt has developed SOB within 24 hours  1. If swelling, where is the swelling located? Feet tightness in both rash on the right foot   2. How much weight have you gained and in what time span?  Increased but due to diet   3. Have you gained 3 pounds in a day or 5 pounds in a week? No   4. Do you have a log of your daily weights (if so, list)? No hasnt weighed in the last week   5. Are you currently taking a fluid pill?  Took a prn lasix that she had from an old rx but it did not help   6. Are you currently SOB? No   7. Have you traveled recently? 33 miles for a family reunion but onset was 1-2 weeks before    Patient is going to send pics in my chart .

## 2020-11-01 NOTE — Telephone Encounter (Addendum)
Spoke to pt. C/o swelling in both feet for past 2-3 weeks with rash on right foot that began on Saturday 10/30/20.  Reports feet are dry, sounds as if non-pitting, pt states no indention when presses on foot.  States she was on her feet "more than usual Saturday 6/4 and were aching all day." Denies pain, but states feet do feel tight. Denies shortness of breath.  Pt does not take a fluid pill but reports taking "an old Rx Lasix 59m x 1 yesterday afternoon" with no change.  Denies weight gain, other than "I have celiac disease and know that my wt gain is r/t this b/c my abdomen becomes bigger when it flares."  Pt does not check daily weights.   Pt recently seen in ED 5/25 for A fib with RVR. Converted back to NSR on her own prior to medication tx.  Pt states she is able to know when she is in a fib and does not feel that she is at this time.  Does have h/o HFpEF. Last ov 08/05/20 with Dr. ESaunders Reveland euvolemic at this visit.  Is not currently on fluid pill. Does take Amlodipine but has been on for at least 1 year without any issues with LE swelling while taking.  Currently has f/u scheduled 11/24/20 with Dr. ESaunders Revel(3 mo f/u).   Advised pt d/t new onset rash along with swelling in feet, I would advise that she contact her PCP for eval of possible cellulitis and assessment of edema.  Notified if PCP feels she needs to be seen by cardiology we can r/s her to be seen sooner in our office.  Also advised pt keep legs elevated, and limit salt/sodium intake. Advised not to wear compression socks at this time d/t possible cellulitis.  Pt voiced understanding and agrees with plan. She will call PCP (Dr. GRosanna Randy.  Also advised pt to let [place] if for any reason she is unable to be seen by PCP.  Pt appreciative and has no further questions/concerns at this time.

## 2020-11-02 ENCOUNTER — Ambulatory Visit: Payer: Self-pay | Admitting: *Deleted

## 2020-11-02 DIAGNOSIS — L0390 Cellulitis, unspecified: Secondary | ICD-10-CM

## 2020-11-02 NOTE — Telephone Encounter (Signed)
Please review. Thanks!  

## 2020-11-02 NOTE — Telephone Encounter (Signed)
Patient was advised. Patient stated feet have redness, swelling and what looks like a rash. Patient wanted to know if she needs a antibiotic. Please advise?

## 2020-11-02 NOTE — Telephone Encounter (Signed)
Decrease amlodipine from 10 to 5 mg daily and it should get improvement in a couple of weeks.

## 2020-11-02 NOTE — Telephone Encounter (Signed)
Pt reports both ankles and feet swelling x 2-3 weeks, worsening since Saturday. Also has rash on right foot. States called cardiologist who advised she call PCP. States feet and ankles are "Pitting." Less swollen in mornings, increased throughout day. Denies pain "Feel tight." Reports rash is not raised, diffuse "Under my skin." right foot only, onset Saturday. Pt states she thinks due to recent increase in amlodipine from 37m to 10 mg. States right foot "Slightly warm to touch." No availability within protocol timeframe of 3 days. Advised UC, would like to see PCP or "Anyone to keep from going to UC."  Assured NT would route to practice for PCPs review and final disposition.  Please advise: [phone] Reason for Disposition . [1] MODERATE pain (e.g., interferes with normal activities, limping) AND [2] present > 3 days  Answer Assessment - Initial Assessment Questions 1. LOCATION: "Which ankle is swollen?" "Where is the swelling?"     Both feet and ankles 2. ONSET: "When did the swelling start?"     Several months, worse last 2 weeks 3. SIZE: "How large is the swelling?"     Pitting per grand daughter who is a nurse 4. PAIN: "Is there any pain?" If Yes, ask: "How bad is it?" (Scale 1-10; or mild, moderate, severe)   - NONE (0): no pain.   - MILD (1-3): doesn't interfere with normal activities.    - MODERATE (4-7): interferes with normal activities (e.g., work or school) or awakens from sleep, limping.    - SEVERE (8-10): excruciating pain, unable to do any normal activities, unable to walk.      NA  5. CAUSE: "What do you think caused the ankle swelling?"     Medication related 6. OTHER SYMPTOMS: "Do you have any other symptoms?" (e.g., fever, chest pain, difficulty breathing, calf pain)     Rash right foot,Not raised, diffuse "Under the skin"  Protocols used: ANKLE SWELLING-A-AH

## 2020-11-02 NOTE — Telephone Encounter (Signed)
Plan of care detailed below. Spoke to pt today, she is contacting PCP for appt to evaluate.

## 2020-11-03 MED ORDER — CEPHALEXIN 500 MG PO CAPS: 500.0000 mg | ORAL_CAPSULE | Freq: Two times a day (BID) | ORAL | 0 refills | Status: DC

## 2020-11-03 NOTE — Telephone Encounter (Signed)
Patient was advised. Rx was sent to pharmacy.

## 2020-11-03 NOTE — Telephone Encounter (Signed)
No appts so can call in Keflex 547m BID for 5 days

## 2020-11-03 NOTE — Addendum Note (Signed)
Addended by: Julieta Bellini on: 11/03/2020 04:22 PM   Modules accepted: Orders

## 2020-11-12 ENCOUNTER — Other Ambulatory Visit: Payer: Self-pay | Admitting: Family Medicine

## 2020-11-12 DIAGNOSIS — F419 Anxiety disorder, unspecified: Secondary | ICD-10-CM

## 2020-11-12 NOTE — Telephone Encounter (Signed)
Medication Refill - Medication: ALPRAZolam (XANAX) 0.5 MG tablet  Pt needs a refill soon, she has been out since Monday. Please advise   Has the patient contacted their pharmacy? Yes.   (Agent: If no, request that the patient contact the pharmacy for the refill.) (Agent: If yes, when and what did the pharmacy advise?)  Preferred Pharmacy:  [facility]  ([address], [address], [address]  [place], [place] [address]  Phone: [phone] Fax: [phone]     Agent: Please be advised that RX refills may take up to 3 business days. We ask that you follow-up with your pharmacy.

## 2020-11-12 NOTE — Telephone Encounter (Signed)
Requested medication (s) are due for refill today: yes   Requested medication (s) are on the active medication list: yes   Last refill: 07/08/2020  Future visit scheduled:yes   Notes to clinic: this refill cannot be delegated  Patient needs medication will be out   Requested Prescriptions  Pending Prescriptions Disp Refills   ALPRAZolam (XANAX) 0.5 MG tablet 90 tablet 0    Sig: Take 1 tablet (0.5 mg total) by mouth at bedtime.      There is no refill protocol information for this order

## 2020-11-15 MED ORDER — ALPRAZOLAM 0.5 MG PO TABS: 0.5000 mg | ORAL_TABLET | Freq: Every day | ORAL | 0 refills | Status: DC

## 2020-11-22 NOTE — Progress Notes (Addendum)
Follow-up Outpatient Visit Date: 11/24/2020  Primary Care Provider: Jerrol Banana., MD [address] [place] [address]  Chief Complaint: Follow-up atrial fibrillation  HPI:  Sandra [name] is a 75 y.o. female with history of paroxysmal atrial fibrillation complicated by intracranial hemorrhage in 06/2020, HFpEF, right-sided breast cancer, celiac disease, Raunaud's disease, hypothyroidism, RLS, GERD, and anxiety, who presents for follow-up of atrial fibrillation.  I last saw her in early March, at which time she was doing relatively well though she continued to note some balance problems.  She was subsequently referred to EP and is undergoing workup for Watchman device.  She presented to the ED last month (10/20/2020) in atrial fibrillation but converted spontaneously to NSR.  She was maintained on low-dose aspirin alone given history of intracranial hemorrhage earlier this year.  Today, Sandra [name] reports that she has been doing fairly well.  She believes that she had an episode of atrial fibrillation on 6/20 that lasted a few hours, with heart rates measured at home around 120 bpm.  She felt mild shortness of breath and lightheadedness during this time, as well as a headache.  Symptoms resolved spontaneously and haven't recurred.  Sandra [name] notes occasional swelling of her feet and ankles; this has improved with deescalation of amlodipine.  She denies chest pain.  She is scheduled for follow-up with neurology (Dr. Leonie Man) later today.  --------------------------------------------------------------------------------------------------  Past Medical History:  Diagnosis Date   (HFpEF) heart failure with preserved ejection fraction ([place])    a. 08/2019 Echo: EF 60-65%, no rwma, mild LVH, Gr2 DD, nl RV fxn. Nl pASP. Mildly dil LA. Mild to mod MR.   Anemia    Anxiety    Arthritis    BRCA gene mutation negative 10/2016   NEGATIVE: [facility]   Breast cancer ([place]) 10/16/2016    T1c,N0;, GRADE I/III, 1.6cm. ER/PR pos  HER2 not over expressed, Right Upper Outer   Celiac disease    Depression    Dyspnea    WITH EXERTION   GERD (gastroesophageal reflux disease)    OCC   Glaucoma    right eye   Headache    H/O MIGRAINES AS TEENAGER   Heart murmur    a. 08/2019 Echo: mild to mod MR.   Hypertension    Hypothyroidism    Non-obstructive CAD (coronary artery disease)    a. 01/2017 MV: Hypertensive response. No ischemia/infarct. EF >65%; b. 09/2019 Cor CTA: Ca2+ = 9.29 (36th percentile). LAD calcified plaque (0-24%), otw nl. Multipel bilat pulm nodules up to 57m.   PAF (paroxysmal atrial fibrillation) [place]    a. 02/2020 Zio: predominantly RSR, 65 (50-105), rare PACs/PVCs, 8 beats NSVT, multiple episodes of PAF lasting up to 1hr 244ms. Avg AF rate 130 (93-170). AF burden <1%. Triggered events = RSR, PACs, and PAF; b. CHA2DS2VASc = 6.   Pneumonia    YEARS AGO   Pulmonary nodules    a. 09/2019 Cor CTA: incidental finding of multiple bilat pulm nodules; b. 12/2019 High Res CT: stable, scattered solid pulm nodules.   Raynaud's disease    RLS (restless legs syndrome)    Past Surgical History:  Procedure Laterality Date   ABDOMINAL HYSTERECTOMY     APPENDECTOMY     AXILLARY LYMPH NODE BIOPSY Right 03/05/2017   Procedure: AXILLARY LYMPH NODE BIOPSY;  Surgeon: ByRobert BellowMD;  Location: [facility] ORS;  Service: General;  Laterality: Right;   CATARACT EXTRACTION Bilateral    CESAREAN SECTION  COLONOSCOPY WITH PROPOFOL N/A 05/19/2015   Procedure: COLONOSCOPY WITH PROPOFOL;  Surgeon: Manya Silvas, MD;  Location: [facility] ENDOSCOPY;  Service: Endoscopy;  Laterality: N/A;   COLONOSCOPY WITH PROPOFOL N/A 08/01/2019   Procedure: COLONOSCOPY WITH PROPOFOL;  Surgeon: Lucilla Lame, MD;  Location: [facility] ENDOSCOPY;  Service: Endoscopy;  Laterality: N/A;   EYE SURGERY     HAND SURGERY Left    MASTECTOMY Right 11/07/2016   RESIDUAL INVASIVE MAMMARY CARCINOMA, SUBAREOLAR ANTERIOR TO  PREVIOUS    SENTINEL NODE BIOPSY Right 11/07/2016   Procedure: SENTINEL NODE BIOPSY;  Surgeon: Robert Bellow, MD;  Location: [facility] ORS;  Service: General;  Laterality: Right;   SIMPLE MASTECTOMY WITH AXILLARY SENTINEL NODE BIOPSY Right 11/07/2016   6 mm ER/PR 100%; Her 2 neu not overexpressed, T1b, N0.  Surgeon: Robert Bellow, MD;  Location: [facility] ORS;  Service: General;  Laterality: Right;   TONSILLECTOMY  AGE 54   UPPER GI ENDOSCOPY  01/12/06   hiatus hernia    Current Meds  Medication Sig   acetaminophen (TYLENOL) 500 MG tablet Take 500-1,000 mg by mouth every 6 (six) hours as needed for mild pain or moderate pain.   ALPRAZolam (XANAX) 0.5 MG tablet Take 1 tablet (0.5 mg total) by mouth at bedtime.   amLODipine (NORVASC) 5 MG tablet Take 5 mg by mouth daily.   atorvastatin (LIPITOR) 40 MG tablet Take 40 mg by mouth daily.   Calcium Carb-Cholecalciferol 500-400 MG-UNIT TABS Take 1-2 tablets by mouth daily.   carvedilol (COREG) 6.25 MG tablet Take 1 tablet (6.25 mg total) by mouth 2 (two) times daily with a meal.   Cholecalciferol (VITAMIN D3) 125 MCG (5000 UT) TABS Take 125 Units by mouth daily.   hydroxypropyl methylcellulose / hypromellose (ISOPTO TEARS / GONIOVISC) 2.5 % ophthalmic solution Place 1 drop into both eyes 4 (four) times daily as needed for dry eyes.   latanoprost (XALATAN) 0.005 % ophthalmic solution Place 1 drop into both eyes at bedtime.   letrozole (FEMARA) 2.5 MG tablet Take 1 tablet (2.5 mg total) by mouth daily.   levothyroxine (SYNTHROID) 100 MCG tablet TAKE 1 TABLET BY MOUTH 30  MINUTES BEFORE BREAKFAST   LOW-DOSE ASPIRIN PO Take 81 mg by mouth daily.   Multiple Vitamin (MULTIVITAMIN WITH MINERALS) TABS tablet Take 1 tablet by mouth daily at 3 pm.   omeprazole (PRILOSEC) 40 MG capsule TAKE 1 CAPSULE BY MOUTH  DAILY   sennosides-docusate sodium (SENOKOT-S) 8.6-50 MG tablet Take 1 tablet by mouth as needed for constipation.   sertraline (ZOLOFT) 100 MG tablet  TAKE 1 TABLET BY MOUTH  DAILY   timolol (BETIMOL) 0.5 % ophthalmic solution Place 1 drop into both eyes 2 (two) times daily.    Allergies: Patient has no known allergies.  Social History   Tobacco Use   Smoking status: Never   Smokeless tobacco: Never  Vaping Use   Vaping Use: Never used  Substance Use Topics   Alcohol use: Not Currently   Drug use: No    Family History  Problem Relation Age of Onset   Cancer Mother    Anemia Mother    Other Mother        lymphosarcoma   Alcohol abuse Father    Prostate cancer Father    Breast cancer Sister        47; 1/2 sister, shared mother.   Cancer Sister        bile duct   Stroke Maternal Grandmother  Breast cancer Maternal Grandmother 68   Heart attack Maternal Grandfather    CAD Paternal Grandfather     Review of Systems: A 12-system review of systems was performed and was negative except as noted in the HPI.  --------------------------------------------------------------------------------------------------  Physical Exam: BP 120/70 (BP Location: Left Arm, Patient Position: Sitting, Cuff Size: Normal)   Pulse 64   Ht 5' 3"  (1.6 m)   Wt 140 lb (63.5 kg)   SpO2 97%   BMI 24.80 kg/m   General:  NAD. Neck: No JVD or HJR. Lungs: Clear to auscultation bilaterally without wheezes or crackles. Heart: Regular rate and rhythm without murmurs, rubs, or gallops. Abdomen: Soft, nontender, nondistended. Extremities: No lower extremity edema.  EKG:  Sinus bradycardia with PAC's as well as anterolateral and inferior ST and T wave changes.  No significant change from prior tracing on 10/20/2020.  Lab Results  Component Value Date   WBC 5.9 10/20/2020   HGB 12.7 10/20/2020   HCT 38.2 10/20/2020   MCV 87.6 10/20/2020   PLT 264 10/20/2020    Lab Results  Component Value Date   NA 140 10/20/2020   K 3.2 (L) 10/20/2020   CL 104 10/20/2020   CO2 27 10/20/2020   BUN 11 10/20/2020   CREATININE 0.57 10/20/2020   GLUCOSE 106  (H) 10/20/2020   ALT 31 10/20/2020    Lab Results  Component Value Date   CHOL 137 07/17/2020   HDL 50 07/17/2020   LDLCALC 68 07/17/2020   TRIG 95 07/17/2020   CHOLHDL 2.7 07/17/2020    --------------------------------------------------------------------------------------------------  ASSESSMENT AND PLAN: Paroxysmal atrial fibrillation: Only one episode of symptomatic a-fib reported since ED visit last month.  Sandra [name] and her daughter remain concerned about recurrent a-fib and need to go to the ED, particularly if symptoms persist for several hours.  She remains on low-dose aspirin alone in the setting of intracranial hemorrhage earlier this year.  She is scheduled for neurology follow-up later today, including guidance on if/when anticoagulation could be restarted or more aggressive antiplatelet therapy used in the setting of Watchman implantation.  I will reach out to Dr. [name] to get his thoughts about antiarrhythmic therapy to prevent symptomatic a-fib.  Addendum (12/29/20): I have seen Sandra [name] is a 75 y.o. female in the office today. The patient is felt to be a poor candidate for long-term anticoagulation because of intracranial hemorrhage while on anticoagulation.  Their CHADS-2-Vasc Score and unadjusted Ischemic Stroke Rate (% per year) is equal to 7.2 % stroke rate/year from a score of 5, necessitating a strategy of stroke prevention with either long-term oral anticoagulation or left atrial appendage occlusion therapy. We have discussed their bleeding risk in the context of their comorbid medical problems, as well as the rationale for referral for evaluation of Watchman left atrial appendage occlusion therapy. While the patient is at high long-term bleeding risk, they may be appropriate for short-term anticoagulation. Based on this individual patient's stroke and bleeding risk, a shared decision has been made to refer the patient for consideration of Watchman left atrial  appendage closure utilizing the [employer] of Cardiology shared decision tool.  Chronic HFpEF: Ms. Emile appears euvolemic with NYHA class II symptoms.  Continue current medications.  Hypertension: Blood pressure is well-controlled today.  We will defer medication changes today.  Hyperlipidemia: LDL at goal.  Continue atorvastatin 40 mg daily.  Follow-up: Return to clinic in 6 months.  Sandra Bush, MD 11/24/2020 10:18 AM

## 2020-11-24 ENCOUNTER — Encounter: Payer: Self-pay | Admitting: Neurology

## 2020-11-24 ENCOUNTER — Other Ambulatory Visit: Payer: Self-pay

## 2020-11-24 ENCOUNTER — Ambulatory Visit: Payer: Medicare Other | Admitting: Neurology

## 2020-11-24 ENCOUNTER — Encounter: Payer: Self-pay | Admitting: Internal Medicine

## 2020-11-24 ENCOUNTER — Ambulatory Visit: Payer: Medicare Other | Admitting: Internal Medicine

## 2020-11-24 VITALS — BP 120/70 | HR 64 | Ht 63.0 in | Wt 140.0 lb

## 2020-11-24 VITALS — BP 145/63 | HR 66 | Ht 63.0 in | Wt 140.0 lb

## 2020-11-24 DIAGNOSIS — R413 Other amnesia: Secondary | ICD-10-CM

## 2020-11-24 DIAGNOSIS — I480 Paroxysmal atrial fibrillation: Secondary | ICD-10-CM | POA: Diagnosis not present

## 2020-11-24 DIAGNOSIS — I10 Essential (primary) hypertension: Secondary | ICD-10-CM

## 2020-11-24 DIAGNOSIS — E785 Hyperlipidemia, unspecified: Secondary | ICD-10-CM

## 2020-11-24 DIAGNOSIS — I5032 Chronic diastolic (congestive) heart failure: Secondary | ICD-10-CM

## 2020-11-24 DIAGNOSIS — I614 Nontraumatic intracerebral hemorrhage in cerebellum: Secondary | ICD-10-CM | POA: Diagnosis not present

## 2020-11-24 NOTE — Progress Notes (Signed)
[facility] [address] [place]. [place] [address] [phone]       OFFICE FOLLOW UP VISITT NOTE  [name] Date of Birth:  [birth year] Medical Record Number:  [id number]   Referring MD:  Sarita Haver  Reason for Referral:  Brain Hemorrhage  BPZ:WCHENID visit 3/9 2022; Sandra [name] is a pleasant 45 year Caucasian lady seen todat  for first office consult visit for brain hemorrhage.She is accompanied by her husband and daughter.I have personally reviewed HPI in person, electronic medical records and pertinent imaging films in PACS. Sandra [name] is a 75 y.o. female with PMH significant for anemia, GERD, hx of Breast cancer(2018), depression, celiac disease, CAD, pAfibb on Eliquis, Restless leg syndrome who presents with mild occipital headache and room spinning since 07/11/20 along with difficult with left hand clumsiness and dropping things with her left hand. She did not think too much of the symptoms and thought this was just a regular headache until daughter noticed and brought her to the ED. Endorses nausea, photophobia. Workup with CTH without contrast with a 1cm focal area of hemorrhage in the left cerebellum dentate nucleus. Her Eliquis was reversed with Kcentra and she was started on Clevidipine for blood pressure control. Repeat CTH in 6 hours showed stable hemorrhage. Further  workup with MRI with no underlying lesion, MRA with no anueryem or vascualr malformation. EF of 65-70% with grade 2 diastolic dysfunction and moderate LVH. No shunt. LDL of 68 with HbA1c of 6.3.She states she is doing well and dizziness and balance are improving and she is currently doing outpatient therapies. She fatigues easily and gets tired and gets off balance sometimes.Family has noticed increasing memory and cognitive deficits which are worse than her baseline memory problems she had even prior. She wants to drive but family ahs concerns. She has started aspirin now. She has appt with Dr  end cardiologist this week to discuss possible watchman device. Update 11/24/2020 : She returns for follow-up after last visit 3 months ago.  She is accompanied by her husband as well as I spoke to her daughter over the phone who was present throughout this visit.  Patient states she is doing well.  She is continues to have mild short-term memory difficulties with she is perspiring and regular activities like solving crossword puzzles and family feels her memory may be slightly improved.  She had bilateral pedal edema and this was felt to be related to Norvasc and dose has been reduced to 5 mg which has helped.  She was seen in the ED on 10/20/2020 with a recurrence of atrial fibrillation but fortunately spontaneously reverted to sinus rhythm.  She has kept a record of her palpitations and she is probably had A. fib in a couple of other occasions as well most recently on 11/15/2020 when she felt dizzy lightheaded and had some headache and shortness of breath.  She was seen by cardiologist Dr. And today who continued on her medications.  She has an appointment to see Dr. [name] in September she and her family are leaning towards getting the watchman device done.  She is presently on aspirin 81 mg daily which is tolerating well without bruising or bleeding.  She denies any other definite symptoms of stroke or TIA.  She and family decided not to participate in the ASPIRE stroke prevention study.  She continues to have significant bilateral hearing loss but has a amplifier today which helps her communicate and hear. ROS:    14  system review of systems is positive for  Dizziness, imbalance,memory loss, difficulty focussing, multitasking and all systems negative  PMH:  Past Medical History:  Diagnosis Date   (HFpEF) heart failure with preserved ejection fraction ([place])    a. 08/2019 Echo: EF 60-65%, no rwma, mild LVH, Gr2 DD, nl RV fxn. Nl pASP. Mildly dil LA. Mild to mod MR.   Anemia    Anxiety    Arthritis     BRCA gene mutation negative 10/2016   NEGATIVE: [facility]   Breast cancer ([place]) 10/16/2016   T1c,N0;, GRADE I/III, 1.6cm. ER/PR pos  HER2 not over expressed, Right Upper Outer   Celiac disease    Depression    Dyspnea    WITH EXERTION   GERD (gastroesophageal reflux disease)    OCC   Glaucoma    right eye   Headache    H/O MIGRAINES AS TEENAGER   Heart murmur    a. 08/2019 Echo: mild to mod MR.   Hypertension    Hypothyroidism    Non-obstructive CAD (coronary artery disease)    a. 01/2017 MV: Hypertensive response. No ischemia/infarct. EF >65%; b. 09/2019 Cor CTA: Ca2+ = 9.29 (36th percentile). LAD calcified plaque (0-24%), otw nl. Multipel bilat pulm nodules up to 65m.   [place]    a. 02/2020 Zio: predominantly RSR, 65 (50-105), rare PACs/PVCs, 8 beats NSVT, multiple episodes of PAF lasting up to 1hr 253ms. Avg AF rate 130 (93-170). AF burden <1%. Triggered events = RSR, PACs, and PAF; b. CHA2DS2VASc = 6.   Pneumonia    YEARS AGO   Pulmonary nodules    a. 09/2019 Cor CTA: incidental finding of multiple bilat pulm nodules; b. 12/2019 High Res CT: stable, scattered solid pulm nodules.   Raynaud's disease    RLS (restless legs syndrome)    [facility]    Social History:  Social History   Socioeconomic History   Marital status: Married    Spouse name: HoNadara Mustard Number of children: 2   Years of education: Not on file   Highest education level: Some college, no degree  Occupational History   Occupation: retired  Tobacco Use   Smoking status: Never   Smokeless tobacco: Never  Vaping Use   Vaping Use: Never used  Substance and Sexual Activity   Alcohol use: Not Currently   Drug use: No   Sexual activity: Not Currently    Birth control/protection: None  Other Topics Concern   Not on file  Social History Narrative   Lives with Husband   Right handed   Drinks 2-3 cups caffiene daily   Social Determinants of Health   Financial Resource  Strain: Low Risk    Difficulty of Paying Living Expenses: Not hard at all  Food Insecurity: No Food Insecurity   Worried About RuCharity fundraisern the Last Year: Never true   Ra[place] the Last Year: Never true  Transportation Needs: No Transportation Needs   Lack of Transportation (Medical): No   Lack of Transportation (Non-Medical): No  Physical Activity: Inactive   Days of Exercise per Week: 0 days   Minutes of Exercise per Session: 0 min  Stress: No Stress Concern Present   Feeling of Stress : Only a little  Social Connections: SoEngineer, building servicesf Communication with Friends and Family: More than three times a week   Frequency of Social Gatherings with Friends and Family: More than three  times a week   Attends Religious Services: More than 4 times per year   Active Member of Clubs or Organizations: Yes   Attends Music therapist: More than 4 times per year   Marital Status: Married  Human resources officer Violence: Not At Risk   Fear of Current or Ex-Partner: No   Emotionally Abused: No   Physically Abused: No   Sexually Abused: No    Medications:   Current Outpatient Medications on File Prior to Visit  Medication Sig Dispense Refill   acetaminophen (TYLENOL) 500 MG tablet Take 500-1,000 mg by mouth every 6 (six) hours as needed for mild pain or moderate pain.     ALPRAZolam (XANAX) 0.5 MG tablet Take 1 tablet (0.5 mg total) by mouth at bedtime. 90 tablet 0   atorvastatin (LIPITOR) 40 MG tablet Take 40 mg by mouth daily.     Calcium Carb-Cholecalciferol 500-400 MG-UNIT TABS Take 1-2 tablets by mouth daily.     carvedilol (COREG) 6.25 MG tablet Take 1 tablet (6.25 mg total) by mouth 2 (two) times daily with a meal. 180 tablet 1   Cholecalciferol (VITAMIN D3) 125 MCG (5000 UT) TABS Take 125 Units by mouth daily.     hydroxypropyl methylcellulose / hypromellose (ISOPTO TEARS / GONIOVISC) 2.5 % ophthalmic solution Place 1 drop into both eyes 4  (four) times daily as needed for dry eyes.     latanoprost (XALATAN) 0.005 % ophthalmic solution Place 1 drop into both eyes at bedtime.  5   letrozole (FEMARA) 2.5 MG tablet Take 1 tablet (2.5 mg total) by mouth daily. 90 tablet 3   levothyroxine (SYNTHROID) 100 MCG tablet TAKE 1 TABLET BY MOUTH 30  MINUTES BEFORE BREAKFAST 90 tablet 0   LOW-DOSE ASPIRIN PO Take 81 mg by mouth daily.     Multiple Vitamin (MULTIVITAMIN WITH MINERALS) TABS tablet Take 1 tablet by mouth daily at 3 pm.     omeprazole (PRILOSEC) 40 MG capsule TAKE 1 CAPSULE BY MOUTH  DAILY 90 capsule 1   sennosides-docusate sodium (SENOKOT-S) 8.6-50 MG tablet Take 1 tablet by mouth as needed for constipation.     sertraline (ZOLOFT) 100 MG tablet TAKE 1 TABLET BY MOUTH  DAILY 90 tablet 1   No current facility-administered medications on file prior to visit.    Allergies:  No Known Allergies  Physical Exam General: frail elderly caucasian lady  seated, in no evident distress Head: head normocephalic and atraumatic.   Neck: supple with no carotid or supraclavicular bruits Cardiovascular: regular rate and rhythm, no murmurs Musculoskeletal: no deformity Skin:  no rash/petichiae Vascular:  Normal pulses all extremities  Neurologic Exam Mental Status: Awake and fully alert. Oriented to place and time. Recent and remote memory intact. Attention span, concentration and fund of knowledge appropriate. Mood and affect appropriate. Diminished recall 2/3. AFT 9 animals which can walk on 4 legs.can copy intersecting pentagons well.  Clock drawing 3/4. Cranial Nerves: Fundoscopic exam not done s. Pupils equal, briskly reactive to light. Extraocular movements full without nystagmus. Visual fields full to confrontation. Hearing intact. Facial sensation intact. Face, tongue, palate moves normally and symmetrically.  Motor: Normal bulk and tone. Normal strength in all tested extremity muscles. Sensory.: intact to touch , pinprick , position  and vibratory sensation.  Coordination: Rapid alternating movements normal in all extremities. Finger-to-nose and heel-to-shin performed accurately bilaterally. Gait and Station: Arises from chair without difficulty. Stance is normal. Gait demonstrates normal stride length and balance . Able to heel,  toe and tandem walk with moderate difficulty.  Reflexes: 1+ and symmetric. Toes downgoing.     ASSESSMENT: 72 year Caucasian lady with small left cerebellar hemorrhage in February 2022 from atrial fibrillation while anticoagulated with eliquis. She is doing well but has mild post stroke cognitive impairment.  She has had recurrent episodes of paroxysmal A. fib and now she is considering having watchman device placed    PLAN: I had a long discussion with the patient and her husband as well as I spoke to her daughter over the phone regarding her cerebellar hemorrhage on Eliquis , mild memory difficulties and atrial fibrillation and risk for recurrent stroke and answered questions.  The patient and family are leaning towards undergoing watchman procedure and have appointment to see Dr. [name] for the same.  I recommend she continue aspirin for now but may be even switch to Eliquis in the interim if she is willing instead.  I encouraged her to increase participation in cognitively challenging activities like solving crossword puzzles, playing bridge and sodoku and we discussed memory compensation strategies.  She will return for follow-up with me in 6 months or call earlier if necessary. have spent a total of  25 minutes  with the patient reviewing hospital notes,  test results, labs and examining the patient as well as establishing an assessment and plan that was discussed personally with the patient.  > 50% of time was spent in direct patient care.    Antony Contras, MD Note: This document was prepared with digital dictation and possible smart phrase technology. Any transcriptional errors that result from  this process are unintentional.

## 2020-11-24 NOTE — Patient Instructions (Signed)
Medication Instructions:  Your physician recommends that you continue on your current medications as directed. Please refer to the Current Medication list given to you today.  *If you need a refill on your cardiac medications before your next appointment, please call your pharmacy*   Lab Work:  None ordered  Testing/Procedures:  None ordered   Follow-Up: At [facility], you and your health needs are our priority.  As part of our continuing mission to provide you with exceptional heart care, we have created designated Provider Care Teams.  These Care Teams include your primary Cardiologist (physician) and Advanced Practice Providers (APPs -  Physician Assistants and Nurse Practitioners) who all work together to provide you with the care you need, when you need it.  We recommend signing up for the patient portal called "MyChart".  Sign up information is provided on this After Visit Summary.  MyChart is used to connect with patients for Virtual Visits (Telemedicine).  Patients are able to view lab/test results, encounter notes, upcoming appointments, etc.  Non-urgent messages can be sent to your provider as well.   To learn more about what you can do with MyChart, go to [email or web address].    Your next appointment:   6 month(s)  The format for your next appointment:   In Person  Provider:   You may see Nelva Bush, MD or one of the following Advanced Practice Providers on your designated Care Team:   Murray Hodgkins, NP Christell Faith, PA-C Marrianne Mood, PA-C Cadence Sunset, [place] Laurann Montana, NP

## 2020-11-24 NOTE — Patient Instructions (Signed)
I had a long discussion with the patient and her husband as well as I spoke to her daughter over the phone regarding her cerebellar hemorrhage on Eliquis , mild memory difficulties and atrial fibrillation and risk for recurrent stroke and answered questions.  The patient and family are leaning towards undergoing watchman procedure and have appointment to see Dr. [name] for the same.  I recommend she continue aspirin for now but may be even switch to Eliquis in the interim if she is willing instead.  I encouraged her to increase participation in cognitively challenging activities like solving crossword puzzles, playing bridge and sodoku and we discussed memory compensation strategies.  She will return for follow-up with me in 6 months or call earlier if necessary. Memory Compensation Strategies  Use "WARM" strategy.  W= write it down  A= associate it  R= repeat it  M= make a mental note  2.   You can keep a Social worker.  Use a 3-ring notebook with sections for the following: calendar, important names and phone numbers,  medications, doctors' names/phone numbers, lists/reminders, and a section to journal what you did  each day.   3.    Use a calendar to write appointments down.  4.    Write yourself a schedule for the day.  This can be placed on the calendar or in a separate section of the Memory Notebook.  Keeping a  regular schedule can help memory.  5.    Use medication organizer with sections for each day or morning/evening pills.  You may need help loading it  6.    Keep a basket, or pegboard by the door.  Place items that you need to take out with you in the basket or on the pegboard.  You may also want to  include a message board for reminders.  7.    Use sticky notes.  Place sticky notes with reminders in a place where the task is performed.  For example: " turn off the  stove" placed by the stove, "lock the door" placed on the door at eye level, " take your medications" on  the  bathroom mirror or by the place where you normally take your medications.  8.    Use alarms/timers.  Use while cooking to remind yourself to check on food or as a reminder to take your medicine, or as a  reminder to make a call, or as a reminder to perform another task, etc.

## 2020-11-25 ENCOUNTER — Encounter: Payer: Self-pay | Admitting: Internal Medicine

## 2020-12-03 ENCOUNTER — Telehealth: Payer: Self-pay

## 2020-12-06 MED ORDER — FLECAINIDE ACETATE 50 MG PO TABS: 50.0000 mg | ORAL_TABLET | Freq: Two times a day (BID) | ORAL | 0 refills | Status: DC

## 2020-12-06 MED ORDER — FLECAINIDE ACETATE 50 MG PO TABS: 50.0000 mg | ORAL_TABLET | Freq: Two times a day (BID) | ORAL | 3 refills | Status: DC

## 2020-12-06 NOTE — Telephone Encounter (Signed)
Left message for Pt.  Will plan on scheduling nurse visit on 12/15/20 at the [place] office at 8:30 am for EKG.  Will plan to get lab work and go over testing for Watchman at that time.  Advised Pt would call to confirm when back in office on July 13.

## 2020-12-06 NOTE — Telephone Encounter (Signed)
Call placed to Pt.  Pt was driving with her husband.    Discussed starting flecainide 50 mg PO BID.  Advised will need to schedule nurse visit on December 15, 2020.  Pt requested this nurse call her back later today to finalize nurse visit and to discuss watchman implant.

## 2020-12-08 NOTE — Telephone Encounter (Signed)
Left message requesting call back to speak with this nurse.

## 2020-12-08 NOTE — Telephone Encounter (Signed)
Spoke with Pt.  Pt scheduled for nurse visit July 20 at 8:30 am for EKG-discuss watchman CT/work up.

## 2020-12-08 NOTE — Telephone Encounter (Signed)
Follow up:    Patient returning a call back to speak with nurse.

## 2020-12-09 ENCOUNTER — Other Ambulatory Visit: Payer: Self-pay

## 2020-12-09 ENCOUNTER — Ambulatory Visit
Admission: RE | Admit: 2020-12-09 | Discharge: 2020-12-09 | Disposition: A | Discharge status: Home / Self Care | Payer: Medicare Other | Source: Ambulatory Visit | Attending: General Surgery | Admitting: General Surgery

## 2020-12-09 DIAGNOSIS — C50911 Malignant neoplasm of unspecified site of right female breast: Secondary | ICD-10-CM

## 2020-12-09 DIAGNOSIS — Z1231 Encounter for screening mammogram for malignant neoplasm of breast: Secondary | ICD-10-CM | POA: Insufficient documentation

## 2020-12-15 ENCOUNTER — Ambulatory Visit (INDEPENDENT_AMBULATORY_CARE_PROVIDER_SITE_OTHER): Payer: Medicare Other

## 2020-12-15 ENCOUNTER — Other Ambulatory Visit: Payer: Self-pay

## 2020-12-15 ENCOUNTER — Other Ambulatory Visit: Payer: Self-pay | Admitting: Physician Assistant

## 2020-12-15 VITALS — HR 52 | Ht 63.0 in

## 2020-12-15 DIAGNOSIS — I480 Paroxysmal atrial fibrillation: Secondary | ICD-10-CM | POA: Diagnosis not present

## 2020-12-15 MED ORDER — METOPROLOL SUCCINATE ER 25 MG PO TB24: 12.5000 mg | ORAL_TABLET | Freq: Every day | ORAL | 3 refills | Status: DC

## 2020-12-15 NOTE — Patient Instructions (Addendum)
Medication Instructions:  Your physician has recommended you make the following change in your medication:    STOP taking carvedilol  2.  START taking metoprolol succinate 25 mg-  TAKE HALF TABLET by mouth daily at bedtime   Labwork: You will get lab work today:  BMP   Testing/Procedures: Your physician has requested that you have cardiac CT. Cardiac computed tomography (CT) is a painless test that uses an x-ray machine to take clear, detailed pictures of your heart.   Follow-Up:   After your cardiac CT testing is completed you will be contacted with further instructions.  If you have further questions about this procedure please call [phone].   Your next office visit will be with the afib clinic prior to the Watchman implant (if indicated):   AFIB CLINIC INFORMATION: Your appointment is scheduled on: _______ at __________. Please arrive 15 minutes early for check-in. The AFib Clinic is located in the Heart and Vascular Specialty Clinics at [facility]. Parking instructions/directions: [address] (off [address]). When you pull in to Entrance C, there is an underground parking garage to your right. The code to enter the garage is _______________. Take the elevators to the first floor. Follow the signs to the Heart and Vascular Specialty Clinics. You will see registration at the end of the hallway. Phone number: [phone]

## 2020-12-16 LAB — BASIC METABOLIC PANEL
BUN/Creatinine Ratio: 24 (ref 12–28)
BUN: 17 mg/dL (ref 8–27)
CO2: 25 mmol/L (ref 20–29)
Calcium: 8.9 mg/dL (ref 8.7–10.3)
Chloride: 103 mmol/L (ref 96–106)
Creatinine, Ser: 0.72 mg/dL (ref 0.57–1.00)
Glucose: 113 mg/dL — ABNORMAL HIGH (ref 65–99)
Potassium: 4.1 mmol/L (ref 3.5–5.2)
Sodium: 142 mmol/L (ref 134–144)
eGFR: 87 mL/min/{1.73_m2} (ref >59)

## 2020-12-20 ENCOUNTER — Other Ambulatory Visit: Payer: Self-pay | Admitting: Family Medicine

## 2020-12-20 DIAGNOSIS — F419 Anxiety disorder, unspecified: Secondary | ICD-10-CM

## 2020-12-21 DIAGNOSIS — Z853 Personal history of malignant neoplasm of breast: Secondary | ICD-10-CM | POA: Diagnosis not present

## 2020-12-21 MED ORDER — ALPRAZOLAM 0.5 MG PO TABS: 0.5000 mg | ORAL_TABLET | Freq: Every day | ORAL | 0 refills | Status: DC

## 2020-12-22 ENCOUNTER — Telehealth (HOSPITAL_COMMUNITY): Payer: Self-pay | Admitting: *Deleted

## 2020-12-22 NOTE — Telephone Encounter (Signed)
Reaching out to patient to offer assistance regarding upcoming cardiac imaging study; pt verbalizes understanding of appt date/time, parking situation and where to check in, pre-test NPO status, and verified current allergies; name and call back number provided for further questions should they arise  Prudence Heiny RN Navigator Cardiac Imaging [facility] [phone] office [phone] cell  

## 2020-12-23 ENCOUNTER — Other Ambulatory Visit: Payer: Self-pay

## 2020-12-23 ENCOUNTER — Ambulatory Visit (HOSPITAL_COMMUNITY)
Admission: RE | Admit: 2020-12-23 | Discharge: 2020-12-23 | Disposition: A | Discharge status: Home / Self Care | Payer: Medicare Other | Source: Ambulatory Visit | Attending: Cardiology | Admitting: Cardiology

## 2020-12-23 DIAGNOSIS — I480 Paroxysmal atrial fibrillation: Secondary | ICD-10-CM | POA: Diagnosis not present

## 2020-12-23 MED ORDER — IOHEXOL 350 MG/ML SOLN
100.0000 mL | Freq: Once | INTRAVENOUS | Status: AC | PRN
  Administered 2020-12-23: 100 mL via INTRAVENOUS

## 2020-12-27 ENCOUNTER — Other Ambulatory Visit: Payer: Self-pay | Admitting: Family Medicine

## 2020-12-29 ENCOUNTER — Other Ambulatory Visit: Payer: Self-pay

## 2020-12-29 ENCOUNTER — Telehealth: Payer: Self-pay

## 2020-12-29 DIAGNOSIS — I619 Nontraumatic intracerebral hemorrhage, unspecified: Secondary | ICD-10-CM

## 2020-12-29 DIAGNOSIS — I480 Paroxysmal atrial fibrillation: Secondary | ICD-10-CM

## 2020-12-29 NOTE — Telephone Encounter (Signed)
Per Dr. [name], scheduled the patient for Watchman procedure 01/27/2021 at 0930.  Scheduled her with Adline Peals, PA, tomorrow (8/4) at 2:30PM for pre-Watchman visit. She understands she will get blood work (BMET and CBC), EKG, and Watchman instructions. Per Dr. [name], at [place] visit she will need to be instructed to STOP ASPIRIN and START ELIQUIS 2.5 mg BID until implant.  She was grateful for call and agrees with plan.

## 2020-12-30 ENCOUNTER — Other Ambulatory Visit: Payer: Self-pay

## 2020-12-30 ENCOUNTER — Other Ambulatory Visit: Payer: Self-pay | Admitting: Family Medicine

## 2020-12-30 ENCOUNTER — Ambulatory Visit (HOSPITAL_COMMUNITY)
Admission: RE | Admit: 2020-12-30 | Discharge: 2020-12-30 | Disposition: A | Discharge status: Home / Self Care | Payer: Medicare Other | Source: Ambulatory Visit | Attending: Physician Assistant | Admitting: Physician Assistant

## 2020-12-30 ENCOUNTER — Encounter (HOSPITAL_COMMUNITY): Payer: Self-pay | Admitting: Physician Assistant

## 2020-12-30 ENCOUNTER — Ambulatory Visit (HOSPITAL_COMMUNITY): Payer: Medicare Other | Admitting: Physician Assistant

## 2020-12-30 VITALS — BP 118/62 | HR 54 | Ht 63.0 in | Wt 138.2 lb

## 2020-12-30 DIAGNOSIS — D6869 Other thrombophilia: Secondary | ICD-10-CM | POA: Insufficient documentation

## 2020-12-30 DIAGNOSIS — Z8249 Family history of ischemic heart disease and other diseases of the circulatory system: Secondary | ICD-10-CM | POA: Insufficient documentation

## 2020-12-30 DIAGNOSIS — Z7901 Long term (current) use of anticoagulants: Secondary | ICD-10-CM | POA: Insufficient documentation

## 2020-12-30 DIAGNOSIS — I10 Essential (primary) hypertension: Secondary | ICD-10-CM | POA: Diagnosis not present

## 2020-12-30 DIAGNOSIS — R079 Chest pain, unspecified: Secondary | ICD-10-CM | POA: Diagnosis not present

## 2020-12-30 DIAGNOSIS — I480 Paroxysmal atrial fibrillation: Secondary | ICD-10-CM | POA: Insufficient documentation

## 2020-12-30 DIAGNOSIS — R0602 Shortness of breath: Secondary | ICD-10-CM | POA: Diagnosis not present

## 2020-12-30 DIAGNOSIS — I2510 Atherosclerotic heart disease of native coronary artery without angina pectoris: Secondary | ICD-10-CM | POA: Diagnosis not present

## 2020-12-30 DIAGNOSIS — Z79899 Other long term (current) drug therapy: Secondary | ICD-10-CM | POA: Insufficient documentation

## 2020-12-30 DIAGNOSIS — F419 Anxiety disorder, unspecified: Secondary | ICD-10-CM

## 2020-12-30 DIAGNOSIS — Z7982 Long term (current) use of aspirin: Secondary | ICD-10-CM | POA: Insufficient documentation

## 2020-12-30 LAB — CBC
HCT: 36.8 % (ref 36.0–46.0)
Hemoglobin: 11.7 g/dL — ABNORMAL LOW (ref 12.0–15.0)
MCH: 29.5 pg (ref 26.0–34.0)
MCHC: 31.8 g/dL (ref 30.0–36.0)
MCV: 92.7 fL (ref 80.0–100.0)
Platelets: 252 10*3/uL (ref 150–400)
RBC: 3.97 MIL/uL (ref 3.87–5.11)
RDW: 12.8 % (ref 11.5–15.5)
WBC: 5.5 10*3/uL (ref 4.0–10.5)
nRBC: 0 % (ref 0.0–0.2)

## 2020-12-30 LAB — BASIC METABOLIC PANEL
Anion gap: 7 (ref 5–15)
BUN: 11 mg/dL (ref 8–23)
CO2: 27 mmol/L (ref 22–32)
Calcium: 9 mg/dL (ref 8.9–10.3)
Chloride: 105 mmol/L (ref 98–111)
Creatinine, Ser: 0.95 mg/dL (ref 0.44–1.00)
GFR, Estimated: >60 mL/min (ref >60)
Glucose, Bld: 107 mg/dL — ABNORMAL HIGH (ref 70–99)
Potassium: 3.8 mmol/L (ref 3.5–5.1)
Sodium: 139 mmol/L (ref 135–145)

## 2020-12-30 MED ORDER — APIXABAN 2.5 MG PO TABS: 2.5000 mg | ORAL_TABLET | Freq: Two times a day (BID) | ORAL | 6 refills | Status: DC

## 2020-12-30 NOTE — Patient Instructions (Addendum)
Stop- Aspirin 31m  Start- Eliguis- taking 2.511m- Take one tablet by mouth twice daily   LEFT ATRIAL APPENDAGE CLOSURE INSTRUCTIONS:  You are scheduled for a Left Atrial Appendage Device Implantation on  September 1st  1. [facility] [id number] 7:30am   (This time is two hours before your procedure to ensure your preparation). Free valet parking service is available. You are allowed ONE visitor in the waiting room during your procedure. Both you and your guest must wear masks. Special note: Every effort is made to have your procedure done on time. Please understand that emergencies sometimes delay scheduled procedures.  2.   Do not eat or drink after midnight prior to your procedure.     3. Medication Instructions: Take ONLY the listed medications morning of procedure with a sip of water:  Amlodipine (Norvasc)- Take one tablet by mouth in the am Flecainide- Take one tablet by mouth in the am            4. Plan for one night stay--bring personal belongings. When you are discharged, you will need someone to drive you home and stay with you for 24 hours.  5. Bring a current list of your medications and current insurance cards.  6. Please wear clothes that are easy to get on and off and wear slip-on shoes.  7. ErAmbrose Pancoastthe [facility] arrange follow-up for you during your hospital stay and you will be discharged with your appointment dates/times. His direct number is [phone] you need assistance.  **If you have any questions, do not hesitate to call the office! You will also be contacted the week of your procedure to confirm instructions.**

## 2020-12-30 NOTE — Progress Notes (Signed)
Primary Care Physician: Jerrol Banana., MD Primary Cardiologist: Dr End  Primary Electrophysiologist: Dr [name] Referring Physician: Dr Verlan Sandra [name] is a 75 y.o. female with a history of intracranial hemorrhage, HTN, CAD, and atrial fibrillation who presents for follow up in the [facility]. Patient has a CHADS2VASC score of 5. Patient was seen by Dr [name]. She is not felt to be a candidate for long term anticoagulation due to intracranial hemorrhage while on Eliquis 06/2020. Eliquis was reversed at that time. She was taken off anticoagulation but remained on ASA. She is scheduled for Watchman implant on 01/27/21.   Today, she denies symptoms of palpitations, chest pain, shortness of breath, orthopnea, PND, lower extremity edema, dizziness, presyncope, syncope, snoring, daytime somnolence, bleeding, or neurologic sequela. The patient is tolerating medications without difficulties and is otherwise without complaint today.    Atrial Fibrillation Risk Factors:  she does not have symptoms or diagnosis of sleep apnea. she does not have a history of rheumatic fever.   she has a BMI of Body mass index is 24.48 kg/m.[name] Filed Weights   12/30/20 1433  Weight: 62.7 kg    Family History  Problem Relation Age of Onset   Cancer Mother    Anemia Mother    Other Mother        lymphosarcoma   Alcohol abuse Father    Prostate cancer Father    Breast cancer Sister        30; 1/2 sister, shared mother.   Cancer Sister        bile duct   Stroke Maternal Grandmother    Breast cancer Maternal Grandmother 68   Heart attack Maternal Grandfather    CAD Paternal Grandfather      Atrial Fibrillation Management history:  Previous antiarrhythmic drugs: flecainide  Previous cardioversions: none Previous ablations: none CHADS2VASC score: 5 Anticoagulation history: Eliquis   Past Medical History:  Diagnosis Date    (HFpEF) heart failure with preserved ejection fraction ([place])    a. 08/2019 Echo: EF 60-65%, no rwma, mild LVH, Gr2 DD, nl RV fxn. Nl pASP. Mildly dil LA. Mild to mod MR.   Anemia    Anxiety    Arthritis    BRCA gene mutation negative 10/2016   NEGATIVE: [facility]   Breast cancer ([place]) 10/16/2016   T1c,N0;, GRADE I/III, 1.6cm. ER/PR pos  HER2 not over expressed, Right Upper Outer   Celiac disease    Depression    Dyspnea    WITH EXERTION   GERD (gastroesophageal reflux disease)    OCC   Glaucoma    right eye   Headache    H/O MIGRAINES AS TEENAGER   Heart murmur    a. 08/2019 Echo: mild to mod MR.   Hypertension    Hypothyroidism    Non-obstructive CAD (coronary artery disease)    a. 01/2017 MV: Hypertensive response. No ischemia/infarct. EF >65%; b. 09/2019 Cor CTA: Ca2+ = 9.29 (36th percentile). LAD calcified plaque (0-24%), otw nl. Multipel bilat pulm nodules up to 26m.   PAF (paroxysmal atrial fibrillation) ([place]    a. 02/2020 Zio: predominantly RSR, 65 (50-105), rare PACs/PVCs, 8 beats NSVT, multiple episodes of PAF lasting up to 1hr 26ms. Avg AF rate 130 (93-170). AF burden <1%. Triggered events = RSR, PACs, and PAF; b. CHA2DS2VASc = 6.   Pneumonia    YEARS AGO   Pulmonary nodules    a. 09/2019 Cor  CTA: incidental finding of multiple bilat pulm nodules; b. 12/2019 High Res CT: stable, scattered solid pulm nodules.   Raynaud's disease    RLS (restless legs syndrome)    Stroke [facility])    Past Surgical History:  Procedure Laterality Date   ABDOMINAL HYSTERECTOMY     APPENDECTOMY     AXILLARY LYMPH NODE BIOPSY Right 03/05/2017   Procedure: AXILLARY LYMPH NODE BIOPSY;  Surgeon: Robert Bellow, MD;  Location: [facility] ORS;  Service: General;  Laterality: Right;   CATARACT EXTRACTION Bilateral    CESAREAN SECTION     COLONOSCOPY WITH PROPOFOL N/A 05/19/2015   Procedure: COLONOSCOPY WITH PROPOFOL;  Surgeon: Manya Silvas, MD;  Location: [facility] ENDOSCOPY;  Service: Endoscopy;   Laterality: N/A;   COLONOSCOPY WITH PROPOFOL N/A 08/01/2019   Procedure: COLONOSCOPY WITH PROPOFOL;  Surgeon: Lucilla Lame, MD;  Location[facility] ENDOSCOPY;  Service: Endoscopy;  Laterality: N/A;   EYE SURGERY     HAND SURGERY Left    MASTECTOMY Right 11/07/2016   RESIDUAL INVASIVE MAMMARY CARCINOMA, SUBAREOLAR ANTERIOR TO PREVIOUS    SENTINEL NODE BIOPSY Right 11/07/2016   Procedure: SENTINEL NODE BIOPSY;  Surgeon: Robert Bellow, MD;  Location: [facility] ORS;  Service: General;  Laterality: Right;   SIMPLE MASTECTOMY WITH AXILLARY SENTINEL NODE BIOPSY Right 11/07/2016   6 mm ER/PR 100%; Her 2 neu not overexpressed, T1b, N0.  Surgeon: Robert Bellow, MD;  Location: [facility] ORS;  Service: General;  Laterality: Right;   TONSILLECTOMY  AGE 48   UPPER GI ENDOSCOPY  01/12/06   hiatus hernia    Current Outpatient Medications  Medication Sig Dispense Refill   acetaminophen (TYLENOL) 500 MG tablet Take 500-1,000 mg by mouth every 6 (six) hours as needed for mild pain or moderate pain.     ALPRAZolam (XANAX) 0.5 MG tablet Take 1 tablet (0.5 mg total) by mouth at bedtime. 90 tablet 0   amLODipine (NORVASC) 5 MG tablet Take 5 mg by mouth daily.     apixaban (ELIQUIS) 2.5 MG TABS tablet Take 1 tablet (2.5 mg total) by mouth 2 (two) times daily. 60 tablet 6   atorvastatin (LIPITOR) 40 MG tablet TAKE 1 TABLET BY MOUTH  DAILY 90 tablet 1   Calcium Carb-Cholecalciferol 500-400 MG-UNIT TABS Take 1-2 tablets by mouth daily.     Cholecalciferol (VITAMIN D3) 125 MCG (5000 UT) TABS Take 125 Units by mouth daily.     flecainide (TAMBOCOR) 50 MG tablet Take 1 tablet (50 mg total) by mouth 2 (two) times daily. 180 tablet 3   hydroxypropyl methylcellulose / hypromellose (ISOPTO TEARS / GONIOVISC) 2.5 % ophthalmic solution Place 1 drop into both eyes 4 (four) times daily as needed for dry eyes.     latanoprost (XALATAN) 0.005 % ophthalmic solution Place 1 drop into both eyes at bedtime.  5   letrozole (FEMARA) 2.5 MG  tablet Take 1 tablet (2.5 mg total) by mouth daily. 90 tablet 3   levothyroxine (SYNTHROID) 100 MCG tablet TAKE 1 TABLET BY MOUTH 30  MINUTES BEFORE BREAKFAST 90 tablet 1   metoprolol succinate (TOPROL XL) 25 MG 24 hr tablet Take 0.5 tablets (12.5 mg total) by mouth daily. At bedtime 45 tablet 3   Multiple Vitamin (MULTIVITAMIN WITH MINERALS) TABS tablet Take 1 tablet by mouth daily at 3 pm.     omeprazole (PRILOSEC) 40 MG capsule TAKE 1 CAPSULE BY MOUTH  DAILY 90 capsule 1   polyethylene glycol (MIRALAX / GLYCOLAX) 17 g packet Take 17  g by mouth daily.     sennosides-docusate sodium (SENOKOT-S) 8.6-50 MG tablet Take 1 tablet by mouth as needed for constipation.     sertraline (ZOLOFT) 100 MG tablet TAKE 1 TABLET BY MOUTH  DAILY 90 tablet 1   timolol (BETIMOL) 0.5 % ophthalmic solution Place 1 drop into both eyes 2 (two) times daily.     No current facility-administered medications for this encounter.    No Known Allergies  Social History   Socioeconomic History   Marital status: Married    Spouse name: Nadara Mustard   Number of children: 2   Years of education: Not on file   Highest education level: Some college, no degree  Occupational History   Occupation: retired  Tobacco Use   Smoking status: Never   Smokeless tobacco: Never  Vaping Use   Vaping Use: Never used  Substance and Sexual Activity   Alcohol use: Not Currently   Drug use: No   Sexual activity: Not Currently    Birth control/protection: None  Other Topics Concern   Not on file  Social History Narrative   Lives with Husband   Right handed   Drinks 2-3 cups caffiene daily   Social Determinants of Health   Financial Resource Strain: Low Risk    Difficulty of Paying Living Expenses: Not hard at all  Food Insecurity: No Food Insecurity   Worried About Charity fundraiser in the Last Year: Never true   [place] in the Last Year: Never true  Transportation Needs: No Transportation Needs   Lack of  Transportation (Medical): No   Lack of Transportation (Non-Medical): No  Physical Activity: Inactive   Days of Exercise per Week: 0 days   Minutes of Exercise per Session: 0 min  Stress: No Stress Concern Present   Feeling of Stress : Only a little  Social Connections: Engineer, building services of Communication with Sandra [name] and Family: More than three times a week   Frequency of Social Gatherings with Sandra [name] and Family: More than three times a week   Attends Religious Services: More than 4 times per year   Active Member of [employer] or Organizations: Yes   Attends Music therapist: More than 4 times per year   Marital Status: Married  Human resources officer Violence: Not At Risk   Fear of Current or Ex-Partner: No   Emotionally Abused: No   Physically Abused: No   Sexually Abused: No     ROS- All systems are reviewed and negative except as per the HPI above.  Physical Exam: Vitals:   12/30/20 1433  BP: 118/62  Pulse: (!) 54  Weight: 62.7 kg  Height: 5' 3"  (1.6 m)    GEN- The patient is well appearing elderly female, alert and oriented x 3 today.   Head- normocephalic, atraumatic Eyes-  Sclera clear, conjunctiva pink Ears- hearing intact Oropharynx- clear Neck- supple  Lungs- Clear to ausculation bilaterally, normal work of breathing Heart- Regular rate and rhythm, no murmurs, rubs or gallops  GI- soft, NT, ND, + BS Extremities- no clubbing, cyanosis, or edema MS- no significant deformity or atrophy Skin- no rash or lesion Psych- euthymic mood, full affect Neuro- strength and sensation are intact  Wt Readings from Last 3 Encounters:  12/30/20 62.7 kg  11/24/20 63.5 kg  11/24/20 63.5 kg    EKG today demonstrates  SB, baseline ST changes Vent. rate 54 BPM PR interval 180 ms QRS duration 94 ms QT/QTcB 478/453 ms  Echo 07/16/20 demonstrated   1. Left ventricular ejection fraction, by estimation, is 65 to 70%. The  left ventricle has normal function.  The left ventricle has no regional  wall motion abnormalities. There is moderate left ventricular hypertrophy.  Left ventricular diastolic  parameters are consistent with Grade II diastolic dysfunction  (pseudonormalization).   2. Right ventricular systolic function is normal. The right ventricular  size is normal.   3. Left atrial size was mildly dilated.   4. Right atrial size was mildly dilated.   5. The mitral valve is normal in structure. Mild mitral valve  regurgitation.   6. The aortic valve is normal in structure. Aortic valve regurgitation is  trivial.   Cardiac CT 12/23/20 The left atrial appendage is a large chicken wing type without thrombus. 2. A 27 mm watchman FLX device is recommended based on the above landing zone measurements (23 maximum diameter). 3. There is no thrombus in the left atrial appendage. 4. An infero-posterior IAS puncture site is recommended. There is evidence of a small PFO.  Epic records are reviewed at length today  HAS-BLED score 4 Hypertension Yes  Abnormal renal and liver function (Dialysis, transplant, Cr >2.26 mg/dL /Cirrhosis or Bilirubin >2x Normal or AST/ALT/AP >3x Normal) No  Stroke No  Bleeding Yes  Labile INR (Unstable/high INR) No  Elderly (>65) Yes  Drugs or alcohol (? 8 drinks/week, anti-plt or NSAID) Yes   CHA2DS2-VASc Score = 5  The patient's score is based upon: CHF History: No HTN History: Yes Diabetes History: No Stroke History: No Vascular Disease History: Yes Age Score: 2 Gender Score: 1     ASSESSMENT AND PLAN: 1. Paroxysmal Atrial Fibrillation (ICD10:  I48.0) The patient's CHA2DS2-VASc score is 5, indicating a 7.2% annual risk of stroke.   Unfortunately, She is not felt to be a long term anticoagulation candidate secondary to intracranial hemorrhage on Eliquis.  The patients chart has been reviewed and they would be a candidate for short term oral anticoagulation.  Procedural risks for the Watchman implant have  again been reviewed with the patient including a risk of stroke, risk of perforation, and risk of device embolization.   Patient scheduled for Watchman implant 01/27/21. Instructions given.  Stop ASA Start Eliquis 2.5 mg BID Check bmet/cbc Continue Toprol 12.5 mg daily Check CXR  2. Secondary Hypercoagulable State (ICD10:  D68.69) The patient is at significant risk for stroke/thromboembolism based upon her CHA2DS2-VASc Score of 5.  Start Apixaban (Eliquis). See plans above.  3. HTN Stable, no changes today.  4. CAD No anginal symptoms.   Follow up for Watchman implant on 01/27/21.   [facility] [address] [place], [place] [address] [phone] 12/30/2020 4:12 PM

## 2021-01-19 ENCOUNTER — Ambulatory Visit (INDEPENDENT_AMBULATORY_CARE_PROVIDER_SITE_OTHER): Payer: Medicare Other | Admitting: Family Medicine

## 2021-01-19 ENCOUNTER — Other Ambulatory Visit: Payer: Self-pay

## 2021-01-19 ENCOUNTER — Encounter: Payer: Self-pay | Admitting: Family Medicine

## 2021-01-19 VITALS — BP 132/74 | HR 56 | Resp 16 | Ht 64.0 in | Wt 140.0 lb

## 2021-01-19 DIAGNOSIS — G3184 Mild cognitive impairment, so stated: Secondary | ICD-10-CM | POA: Diagnosis not present

## 2021-01-19 DIAGNOSIS — I10 Essential (primary) hypertension: Secondary | ICD-10-CM

## 2021-01-19 DIAGNOSIS — I700 Atherosclerosis of aorta: Secondary | ICD-10-CM

## 2021-01-19 DIAGNOSIS — R7303 Prediabetes: Secondary | ICD-10-CM

## 2021-01-19 DIAGNOSIS — I480 Paroxysmal atrial fibrillation: Secondary | ICD-10-CM

## 2021-01-19 DIAGNOSIS — I619 Nontraumatic intracerebral hemorrhage, unspecified: Secondary | ICD-10-CM

## 2021-01-19 LAB — POCT GLYCOSYLATED HEMOGLOBIN (HGB A1C)
Est. average glucose Bld gHb Est-mCnc: 126
Hemoglobin A1C: 6 % — AB (ref 4.0–5.6)

## 2021-01-19 NOTE — Progress Notes (Signed)
I,[name],acting as a scribe for [name], MD.,have documented all relevant documentation on the behalf of [name], MD,as directed by  [name], MD while in the presence of [name], MD.  Established patient visit   Patient: Sandra [name]   DOB: August 27, 1945   74 y.o. Female  MRN: [id number] Visit Date: 01/19/2021  Today's healthcare provider: [name], MD   Chief Complaint  Patient presents with   Follow-up   Subjective  -------------------------------------------------------------------------------------------------------------------- HPI  This follow-up to make sure all risk factors are treated for hemorrhagic stroke. She has been followed and evaluated by cardiology for atrial fibrillation.  She is now back on Eliquis at a low dose until her watchman procedure. Hypertension, follow-up  BP Readings from Last 3 Encounters:  01/19/21 132/74  12/30/20 118/62  12/23/20 (!) 157/72   Wt Readings from Last 3 Encounters:  01/19/21 140 lb (63.5 kg)  12/30/20 138 lb 3.2 oz (62.7 kg)  11/24/20 140 lb (63.5 kg)     She was last seen for hypertension 5 months ago.  BP at that visit was 119/68. Management since that visit includes; on amlodipine and metoprolol. She reports good compliance with treatment. She is not having side effects. none She  some  exercising. She is adherent to low salt diet.   Outside blood pressures are checks occasionally.  She does not smoke.  Use of agents associated with hypertension: none.   --------------------------------------------------------------------------------------------------- Prediabetes, Follow-up  Lab Results  Component Value Date   HGBA1C 6.3 (H) 07/17/2020   HGBA1C 6.2 (H) 07/16/2020   HGBA1C 5.6 05/31/2016   GLUCOSE 107 (H) 12/30/2020   GLUCOSE 113 (H) 12/15/2020   GLUCOSE 106 (H) 10/20/2020    Last seen for for this6 months ago.  Management since that visit  includes; no changes were made. Current symptoms include none and have been unchanged.  Prior visit with dietician: no Current diet: well balanced Current exercise:  some walking and housework  Pertinent Labs:    Component Value Date/Time   CHOL 137 07/17/2020 0436   CHOL 133 04/28/2020 1039   TRIG 95 07/17/2020 0436   CHOLHDL 2.7 07/17/2020 0436   CREATININE 0.95 12/30/2020 1440    Wt Readings from Last 3 Encounters:  01/19/21 140 lb (63.5 kg)  12/30/20 138 lb 3.2 oz (62.7 kg)  11/24/20 140 lb (63.5 kg)    -----------------------------------------------------------------------------------------   Follow up for MCI (mild cognitive impairment): Neurologically she is intact but she is not back to her normal level of cognition. The patient was last seen for this 3 months ago. Changes made at last visit include; MMSE later in the year.  She has improved since her stroke.  She reports good compliance with treatment. She feels that condition is Unchanged. She is not having side effects. none  -----------------------------------------------------------------------------------------       Medications: Outpatient Medications Prior to Visit  Medication Sig   acetaminophen (TYLENOL) 500 MG tablet Take 500-1,000 mg by mouth every 6 (six) hours as needed for mild pain or moderate pain.   ALPRAZolam (XANAX) 0.5 MG tablet Take 1 tablet (0.5 mg total) by mouth at bedtime.   amLODipine (NORVASC) 5 MG tablet Take 5 mg by mouth daily.   apixaban (ELIQUIS) 2.5 MG TABS tablet Take 1 tablet (2.5 mg total) by mouth 2 (two) times daily.   atorvastatin (LIPITOR) 40 MG tablet TAKE 1 TABLET BY MOUTH  DAILY   Calcium Carb-Cholecalciferol  500-400 MG-UNIT TABS Take 1-2 tablets by mouth daily.   Cholecalciferol (VITAMIN D3) 125 MCG (5000 UT) TABS Take 125 Units by mouth daily.   flecainide (TAMBOCOR) 50 MG tablet Take 1 tablet (50 mg total) by mouth 2 (two) times daily.   hydroxypropyl  methylcellulose / hypromellose (ISOPTO TEARS / GONIOVISC) 2.5 % ophthalmic solution Place 1 drop into both eyes 4 (four) times daily as needed for dry eyes.   latanoprost (XALATAN) 0.005 % ophthalmic solution Place 1 drop into both eyes at bedtime.   letrozole (FEMARA) 2.5 MG tablet Take 1 tablet (2.5 mg total) by mouth daily.   levothyroxine (SYNTHROID) 100 MCG tablet TAKE 1 TABLET BY MOUTH 30  MINUTES BEFORE BREAKFAST   metoprolol succinate (TOPROL XL) 25 MG 24 hr tablet Take 0.5 tablets (12.5 mg total) by mouth daily. At bedtime   Multiple Vitamin (MULTIVITAMIN WITH MINERALS) TABS tablet Take 1 tablet by mouth daily at 3 pm.   omeprazole (PRILOSEC) 40 MG capsule TAKE 1 CAPSULE BY MOUTH  DAILY   polyethylene glycol (MIRALAX / GLYCOLAX) 17 g packet Take 17 g by mouth daily.   sennosides-docusate sodium (SENOKOT-S) 8.6-50 MG tablet Take 1 tablet by mouth as needed for constipation.   sertraline (ZOLOFT) 100 MG tablet TAKE 1 TABLET BY MOUTH  DAILY   timolol (BETIMOL) 0.5 % ophthalmic solution Place 1 drop into both eyes 2 (two) times daily.   No facility-administered medications prior to visit.    Review of Systems  Constitutional:  Negative for appetite change, chills, fatigue and fever.  Respiratory:  Negative for chest tightness and shortness of breath.   Cardiovascular:  Negative for chest pain and palpitations.  Gastrointestinal:  Negative for abdominal pain, nausea and vomiting.  Neurological:  Negative for dizziness and weakness.       Objective  -------------------------------------------------------------------------------------------------------------------- BP 132/74 (BP Location: Left Arm, Patient Position: Sitting, Cuff Size: Normal)   Pulse (!) 56   Resp 16   Ht 5' 4"  (1.626 m)   Wt 140 lb (63.5 kg)   SpO2 97%   BMI 24.03 kg/m  BP Readings from Last 3 Encounters:  01/27/21 (!) 150/66  01/19/21 132/74  12/30/20 118/62   Wt Readings from Last 3 Encounters:  01/27/21  139 lb (63 kg)  01/19/21 140 lb (63.5 kg)  12/30/20 138 lb 3.2 oz (62.7 kg)       Physical Exam Vitals reviewed.  Constitutional:      Appearance: Normal appearance. She is normal weight.  HENT:     Head: Normocephalic.     Right Ear: External ear normal.     Left Ear: External ear normal.     Nose: Nose normal.     Mouth/Throat:     Pharynx: Oropharynx is clear.  Eyes:     General: No scleral icterus.    Conjunctiva/sclera: Conjunctivae normal.  Cardiovascular:     Rate and Rhythm: Normal rate and regular rhythm.     Pulses: Normal pulses.     Heart sounds: Normal heart sounds.  Pulmonary:     Breath sounds: Normal breath sounds.  Abdominal:     Palpations: Abdomen is soft.  Skin:    General: Skin is warm and dry.  Neurological:     General: No focal deficit present.     Mental Status: She is alert and oriented to person, place, and time. Mental status is at baseline.     Comments: Gait is normal and she appears to be alert and  oriented today.  Psychiatric:        Mood and Affect: Mood normal.        Behavior: Behavior normal.        Thought Content: Thought content normal.        Judgment: Judgment normal.      No results found for any visits on 01/19/21.  Assessment & Plan  ----------------------------------------------------------------------------------------------------------------------  1. Hemorrhagic stroke ([place]) Back on low-dose Eliquis until her watchman procedure by cardiology  2. Essential hypertension Good blood pressure control.  Continue amlodipine and metoprolol  3. MCI (mild cognitive impairment) From hemorrhagic stroke.  MMSE towards the end of the year  4. Pre-diabetes A1c today is 6.0 - POCT glycosylated hemoglobin (Hb A1C)  5. Atherosclerosis of aorta ([place]) All risk factors treated.  She is on atorvastatin  6. Aortic atherosclerosis ([place])   7. PAF (paroxysmal atrial fibrillation) [facility]) Per cardiology.  No follow-ups on file.       I, [name], MD, have reviewed all documentation for this visit. The documentation on 01/27/21 for the exam, diagnosis, procedures, and orders are all accurate and complete.    Yani Coventry Cranford Mon, MD  [facility] [phone] (phone) [phone] (fax)  [place]

## 2021-01-25 ENCOUNTER — Other Ambulatory Visit: Payer: Self-pay | Admitting: Cardiology

## 2021-01-25 NOTE — Telephone Encounter (Signed)
Spoke in detail with the patient about Watchman instructions. Reviewed medication instructions. She understands Nell Range will make sure she is comfortable after her procedure. She will go today for Covid testing. She understands she will be called if Covid test is positive. She will call if she has any questions prior to surgery. She was grateful for call and agrees with plan.

## 2021-01-26 LAB — SARS CORONAVIRUS 2 (TAT 6-24 HRS): SARS Coronavirus 2: NEGATIVE

## 2021-01-27 ENCOUNTER — Telehealth: Payer: Self-pay

## 2021-01-27 ENCOUNTER — Inpatient Hospital Stay (HOSPITAL_COMMUNITY)
Admission: RE | Admit: 2021-01-27 | Discharge: 2021-01-27 | Disposition: A | Discharge status: Home / Self Care | Payer: Medicare Other | Source: Ambulatory Visit | Attending: Cardiology | Admitting: Cardiology

## 2021-01-27 ENCOUNTER — Other Ambulatory Visit: Payer: Self-pay

## 2021-01-27 ENCOUNTER — Inpatient Hospital Stay (HOSPITAL_COMMUNITY): Payer: Medicare Other | Admitting: Certified Registered Nurse Anesthetist

## 2021-01-27 ENCOUNTER — Encounter (HOSPITAL_COMMUNITY): Payer: Self-pay | Admitting: Cardiology

## 2021-01-27 ENCOUNTER — Inpatient Hospital Stay (HOSPITAL_COMMUNITY)
Admission: RE | Admit: 2021-01-27 | Discharge: 2021-01-28 | DRG: 274 | Disposition: A | Discharge status: Home / Self Care | Payer: Medicare Other | Attending: Cardiology | Admitting: Cardiology

## 2021-01-27 ENCOUNTER — Encounter (HOSPITAL_COMMUNITY)
Admission: RE | Disposition: A | Discharge status: Home / Self Care | Payer: Medicare Other | Source: Home / Self Care | Attending: Cardiology

## 2021-01-27 DIAGNOSIS — Z9011 Acquired absence of right breast and nipple: Secondary | ICD-10-CM | POA: Diagnosis not present

## 2021-01-27 DIAGNOSIS — I7300 Raynaud's syndrome without gangrene: Secondary | ICD-10-CM | POA: Diagnosis present

## 2021-01-27 DIAGNOSIS — C50911 Malignant neoplasm of unspecified site of right female breast: Secondary | ICD-10-CM | POA: Diagnosis present

## 2021-01-27 DIAGNOSIS — E785 Hyperlipidemia, unspecified: Secondary | ICD-10-CM | POA: Diagnosis not present

## 2021-01-27 DIAGNOSIS — Z006 Encounter for examination for normal comparison and control in clinical research program: Secondary | ICD-10-CM

## 2021-01-27 DIAGNOSIS — K900 Celiac disease: Secondary | ICD-10-CM | POA: Diagnosis not present

## 2021-01-27 DIAGNOSIS — I2510 Atherosclerotic heart disease of native coronary artery without angina pectoris: Secondary | ICD-10-CM | POA: Diagnosis present

## 2021-01-27 DIAGNOSIS — F32A Depression, unspecified: Secondary | ICD-10-CM | POA: Diagnosis present

## 2021-01-27 DIAGNOSIS — Z95818 Presence of other cardiac implants and grafts: Secondary | ICD-10-CM

## 2021-01-27 DIAGNOSIS — E039 Hypothyroidism, unspecified: Secondary | ICD-10-CM | POA: Diagnosis present

## 2021-01-27 DIAGNOSIS — G2581 Restless legs syndrome: Secondary | ICD-10-CM | POA: Diagnosis present

## 2021-01-27 DIAGNOSIS — I110 Hypertensive heart disease with heart failure: Secondary | ICD-10-CM | POA: Diagnosis present

## 2021-01-27 DIAGNOSIS — Z79899 Other long term (current) drug therapy: Secondary | ICD-10-CM

## 2021-01-27 DIAGNOSIS — M1990 Unspecified osteoarthritis, unspecified site: Secondary | ICD-10-CM | POA: Diagnosis present

## 2021-01-27 DIAGNOSIS — K219 Gastro-esophageal reflux disease without esophagitis: Secondary | ICD-10-CM | POA: Diagnosis present

## 2021-01-27 DIAGNOSIS — Z7982 Long term (current) use of aspirin: Secondary | ICD-10-CM

## 2021-01-27 DIAGNOSIS — I5032 Chronic diastolic (congestive) heart failure: Secondary | ICD-10-CM | POA: Diagnosis not present

## 2021-01-27 DIAGNOSIS — Z803 Family history of malignant neoplasm of breast: Secondary | ICD-10-CM

## 2021-01-27 DIAGNOSIS — R296 Repeated falls: Secondary | ICD-10-CM | POA: Diagnosis not present

## 2021-01-27 DIAGNOSIS — Z8701 Personal history of pneumonia (recurrent): Secondary | ICD-10-CM | POA: Diagnosis not present

## 2021-01-27 DIAGNOSIS — I480 Paroxysmal atrial fibrillation: Secondary | ICD-10-CM

## 2021-01-27 DIAGNOSIS — I088 Other rheumatic multiple valve diseases: Secondary | ICD-10-CM | POA: Diagnosis not present

## 2021-01-27 DIAGNOSIS — I619 Nontraumatic intracerebral hemorrhage, unspecified: Secondary | ICD-10-CM

## 2021-01-27 DIAGNOSIS — Z8673 Personal history of transient ischemic attack (TIA), and cerebral infarction without residual deficits: Secondary | ICD-10-CM | POA: Diagnosis not present

## 2021-01-27 DIAGNOSIS — Z823 Family history of stroke: Secondary | ICD-10-CM | POA: Diagnosis not present

## 2021-01-27 DIAGNOSIS — H409 Unspecified glaucoma: Secondary | ICD-10-CM | POA: Diagnosis not present

## 2021-01-27 DIAGNOSIS — F419 Anxiety disorder, unspecified: Secondary | ICD-10-CM | POA: Diagnosis present

## 2021-01-27 DIAGNOSIS — Z8249 Family history of ischemic heart disease and other diseases of the circulatory system: Secondary | ICD-10-CM

## 2021-01-27 DIAGNOSIS — Z79890 Hormone replacement therapy (postmenopausal): Secondary | ICD-10-CM

## 2021-01-27 DIAGNOSIS — I4891 Unspecified atrial fibrillation: Secondary | ICD-10-CM | POA: Diagnosis present

## 2021-01-27 HISTORY — DX: Presence of other cardiac implants and grafts: Z95.818

## 2021-01-27 HISTORY — PX: LEFT ATRIAL APPENDAGE OCCLUSION: EP1229

## 2021-01-27 HISTORY — PX: TEE WITHOUT CARDIOVERSION: SHX5443

## 2021-01-27 LAB — SURGICAL PCR SCREEN
MRSA, PCR: NEGATIVE
Staphylococcus aureus: POSITIVE — AB

## 2021-01-27 LAB — POCT ACTIVATED CLOTTING TIME: Activated Clotting Time: 288 seconds

## 2021-01-27 SURGERY — LEFT ATRIAL APPENDAGE OCCLUSION
Anesthesia: General

## 2021-01-27 MED ORDER — SODIUM CHLORIDE 0.9 % IV SOLN: 250.0000 mL | INTRAVENOUS | Status: DC | PRN

## 2021-01-27 MED ORDER — SODIUM CHLORIDE 0.9 % IV SOLN: INTRAVENOUS | Status: DC

## 2021-01-27 MED ORDER — ALPRAZOLAM 0.5 MG PO TABS
0.5000 mg | ORAL_TABLET | Freq: Every day | ORAL | Status: DC
  Administered 2021-01-27: 0.5 mg via ORAL
  Filled 2021-01-27: qty 1

## 2021-01-27 MED ORDER — HEPARIN (PORCINE) IN NACL 1000-0.9 UT/500ML-% IV SOLN
INTRAVENOUS | Status: DC | PRN
  Administered 2021-01-27 (×2): 500 mL

## 2021-01-27 MED ORDER — SODIUM CHLORIDE 0.9% FLUSH: 3.0000 mL | INTRAVENOUS | Status: DC | PRN

## 2021-01-27 MED ORDER — HEPARIN SODIUM (PORCINE) 1000 UNIT/ML IJ SOLN
INTRAMUSCULAR | Status: AC
  Filled 2021-01-27: qty 1

## 2021-01-27 MED ORDER — ROCURONIUM BROMIDE 10 MG/ML (PF) SYRINGE
PREFILLED_SYRINGE | INTRAVENOUS | Status: DC | PRN
  Administered 2021-01-27: 60 mg via INTRAVENOUS

## 2021-01-27 MED ORDER — CHLORHEXIDINE GLUCONATE 0.12 % MT SOLN
OROMUCOSAL | Status: AC
  Administered 2021-01-27: 15 mL
  Filled 2021-01-27: qty 15

## 2021-01-27 MED ORDER — LEVOTHYROXINE SODIUM 100 MCG PO TABS
100.0000 ug | ORAL_TABLET | Freq: Every day | ORAL | Status: DC
  Administered 2021-01-28: 100 ug via ORAL
  Filled 2021-01-27: qty 1

## 2021-01-27 MED ORDER — ONDANSETRON HCL 4 MG/2ML IJ SOLN: 4.0000 mg | Freq: Four times a day (QID) | INTRAMUSCULAR | Status: DC | PRN

## 2021-01-27 MED ORDER — METOPROLOL SUCCINATE ER 25 MG PO TB24
12.5000 mg | ORAL_TABLET | Freq: Every day | ORAL | Status: DC
  Administered 2021-01-27: 12.5 mg via ORAL
  Filled 2021-01-27: qty 1

## 2021-01-27 MED ORDER — SODIUM CHLORIDE 0.9% FLUSH
3.0000 mL | Freq: Two times a day (BID) | INTRAVENOUS | Status: DC
  Administered 2021-01-27 – 2021-01-28 (×2): 3 mL via INTRAVENOUS

## 2021-01-27 MED ORDER — PANTOPRAZOLE SODIUM 40 MG PO TBEC
40.0000 mg | DELAYED_RELEASE_TABLET | Freq: Every day | ORAL | Status: DC
  Administered 2021-01-27 – 2021-01-28 (×2): 40 mg via ORAL
  Filled 2021-01-27 (×2): qty 1

## 2021-01-27 MED ORDER — MUPIROCIN 2 % EX OINT
1.0000 "application " | TOPICAL_OINTMENT | Freq: Two times a day (BID) | CUTANEOUS | Status: DC
  Administered 2021-01-27 – 2021-01-28 (×3): 1 via NASAL
  Filled 2021-01-27 (×2): qty 22

## 2021-01-27 MED ORDER — EPHEDRINE SULFATE-NACL 50-0.9 MG/10ML-% IV SOSY
PREFILLED_SYRINGE | INTRAVENOUS | Status: DC | PRN
  Administered 2021-01-27: 10 mg via INTRAVENOUS

## 2021-01-27 MED ORDER — ONDANSETRON HCL 4 MG/2ML IJ SOLN
INTRAMUSCULAR | Status: DC | PRN
  Administered 2021-01-27: 4 mg via INTRAVENOUS

## 2021-01-27 MED ORDER — LIDOCAINE 2% (20 MG/ML) 5 ML SYRINGE
INTRAMUSCULAR | Status: DC | PRN
  Administered 2021-01-27: 100 mg via INTRAVENOUS

## 2021-01-27 MED ORDER — LETROZOLE 2.5 MG PO TABS
2.5000 mg | ORAL_TABLET | Freq: Every day | ORAL | Status: DC
  Administered 2021-01-27: 2.5 mg via ORAL
  Filled 2021-01-27 (×2): qty 1

## 2021-01-27 MED ORDER — APIXABAN 2.5 MG PO TABS
2.5000 mg | ORAL_TABLET | Freq: Two times a day (BID) | ORAL | Status: DC
  Administered 2021-01-27 – 2021-01-28 (×2): 2.5 mg via ORAL
  Filled 2021-01-27 (×2): qty 1

## 2021-01-27 MED ORDER — PROPOFOL 10 MG/ML IV BOLUS
INTRAVENOUS | Status: DC | PRN
  Administered 2021-01-27: 100 mg via INTRAVENOUS

## 2021-01-27 MED ORDER — FENTANYL CITRATE (PF) 250 MCG/5ML IJ SOLN
INTRAMUSCULAR | Status: DC | PRN
  Administered 2021-01-27: 100 ug via INTRAVENOUS

## 2021-01-27 MED ORDER — CHLORHEXIDINE GLUCONATE CLOTH 2 % EX PADS
6.0000 | MEDICATED_PAD | Freq: Every day | CUTANEOUS | Status: DC
  Administered 2021-01-28: 6 via TOPICAL

## 2021-01-27 MED ORDER — DEXAMETHASONE SODIUM PHOSPHATE 10 MG/ML IJ SOLN
INTRAMUSCULAR | Status: DC | PRN
  Administered 2021-01-27: 10 mg via INTRAVENOUS

## 2021-01-27 MED ORDER — SERTRALINE HCL 100 MG PO TABS
100.0000 mg | ORAL_TABLET | Freq: Every day | ORAL | Status: DC
  Administered 2021-01-27: 100 mg via ORAL
  Filled 2021-01-27: qty 1

## 2021-01-27 MED ORDER — ATORVASTATIN CALCIUM 40 MG PO TABS
40.0000 mg | ORAL_TABLET | Freq: Every day | ORAL | Status: DC
  Administered 2021-01-28: 40 mg via ORAL
  Filled 2021-01-27: qty 1

## 2021-01-27 MED ORDER — HEPARIN (PORCINE) IN NACL 1000-0.9 UT/500ML-% IV SOLN
INTRAVENOUS | Status: AC
  Filled 2021-01-27: qty 1000

## 2021-01-27 MED ORDER — HEPARIN SODIUM (PORCINE) 1000 UNIT/ML IJ SOLN
INTRAMUSCULAR | Status: DC | PRN
  Administered 2021-01-27: 9000 [IU] via INTRAVENOUS

## 2021-01-27 MED ORDER — SUGAMMADEX SODIUM 200 MG/2ML IV SOLN
INTRAVENOUS | Status: DC | PRN
  Administered 2021-01-27: 200 mg via INTRAVENOUS

## 2021-01-27 MED ORDER — AMLODIPINE BESYLATE 5 MG PO TABS
5.0000 mg | ORAL_TABLET | Freq: Every day | ORAL | Status: DC
  Administered 2021-01-28: 5 mg via ORAL
  Filled 2021-01-27: qty 1

## 2021-01-27 MED ORDER — IOHEXOL 350 MG/ML SOLN
INTRAVENOUS | Status: DC | PRN
  Administered 2021-01-27: 30 mL

## 2021-01-27 MED ORDER — CEFAZOLIN SODIUM-DEXTROSE 2-4 GM/100ML-% IV SOLN
2.0000 g | INTRAVENOUS | Status: AC
  Administered 2021-01-27: 2 g via INTRAVENOUS
  Filled 2021-01-27: qty 100

## 2021-01-27 MED ORDER — ACETAMINOPHEN 325 MG PO TABS: 650.0000 mg | ORAL_TABLET | ORAL | Status: DC | PRN

## 2021-01-27 MED ORDER — FLECAINIDE ACETATE 50 MG PO TABS
50.0000 mg | ORAL_TABLET | Freq: Two times a day (BID) | ORAL | Status: DC
  Administered 2021-01-27 – 2021-01-28 (×2): 50 mg via ORAL
  Filled 2021-01-27 (×3): qty 1

## 2021-01-27 MED ORDER — HEPARIN (PORCINE) IN NACL 2000-0.9 UNIT/L-% IV SOLN
INTRAVENOUS | Status: AC
  Filled 2021-01-27: qty 1000

## 2021-01-27 MED ORDER — PROTAMINE SULFATE 10 MG/ML IV SOLN
INTRAVENOUS | Status: DC | PRN
  Administered 2021-01-27: 25 mg via INTRAVENOUS

## 2021-01-27 MED ORDER — HEPARIN (PORCINE) IN NACL 2000-0.9 UNIT/L-% IV SOLN
INTRAVENOUS | Status: DC | PRN
  Administered 2021-01-27: 1000 mL

## 2021-01-27 SURGICAL SUPPLY — 13 items
CATH DIAG 6FR PIGTAIL ANGLED (CATHETERS) ×2 IMPLANT
CLOSURE PERCLOSE PROSTYLE (VASCULAR PRODUCTS) ×4 IMPLANT
DILATOR VESSEL 38 20CM 12FR (INTRODUCER) ×2 IMPLANT
DILATOR VESSEL 38 20CM 16FR (INTRODUCER) ×2 IMPLANT
KIT VERSACROSS LRG ACCESS (CATHETERS) ×2 IMPLANT
SHEATH PERFORMER 16FR 30 (SHEATH) ×2 IMPLANT
SHEATH PINNACLE 8F 10CM (SHEATH) ×2 IMPLANT
SHEATH PROBE COVER 6X72 (BAG) ×2 IMPLANT
TUBING CIL FLEX 10 FLL-RA (TUBING) ×2 IMPLANT
WATCHMAN FLX 24 (Prosthesis & Implant Heart) ×2 IMPLANT
WATCHMAN FLX PROCEDURE DEVICE (KITS) ×2 IMPLANT
WATCHMAN PROCED TRUSEAL ACCESS (SHEATH) ×2 IMPLANT
WATCHMAN TRUSEAL DOUBLE CURVE (SHEATH) ×2 IMPLANT

## 2021-01-27 NOTE — Anesthesia Procedure Notes (Signed)
Procedure Name: Intubation Date/Time: 01/27/2021 10:22 AM Performed by: Lance Coon, CRNA Pre-anesthesia Checklist: Patient identified, Emergency Drugs available, Suction available, Patient being monitored and Timeout performed Patient Re-evaluated:Patient Re-evaluated prior to induction Oxygen Delivery Method: Circle system utilized Preoxygenation: Pre-oxygenation with 100% oxygen Induction Type: IV induction Ventilation: Mask ventilation without difficulty Laryngoscope Size: Miller and 3 Grade View: Grade I Tube type: Oral Tube size: 7.0 mm Number of attempts: 1 Airway Equipment and Method: Stylet Placement Confirmation: ETT inserted through vocal cords under direct vision, positive ETCO2 and breath sounds checked- equal and bilateral Secured at: 21 cm Tube secured with: Tape Dental Injury: Teeth and Oropharynx as per pre-operative assessment

## 2021-01-27 NOTE — Discharge Instructions (Signed)
[facility] Procedure, Care After  Procedure MD: Dr. Benson Norway Clinical Coordinator: Lenice Llamas RN   This sheet gives you information about how to care for yourself after your procedure. Your health care provider may also give you more specific instructions. If you have problems or questions, contact your health care provider.  What can I expect after the procedure? After the procedure, it is common to have: Bruising around your puncture site. Tenderness around your puncture site. Tiredness (fatigue).  Medication instructions It is very important to continue to take your blood thinner as directed by your doctor after the Watchman procedure. Call your procedure doctor's office with question or concerns. If you are on Coumadin (warfarin), you will have your INR checked the week after your procedure, with a goal INR of 2.0 - 3.0. Please follow your medication instructions on your discharge summary. Only take the medications listed on your discharge paperwork.  Follow up You will be seen in 1 month after your procedure in the [facility] [place] will have another TEE (Transesophageal Echocardiogram) approximately 6 weeks after your procedure mark to check your device You will follow up the MD/APP who performed your procedure 6 months after your procedure The Watchman Clinical Coordinator will check in with you from time to time, including 1 and 2 years after your procedure.    Follow these instructions at home: Puncture site care  Follow instructions from your health care provider about how to take care of your puncture site. Make sure you: If present, leave stitches (sutures), skin glue, or adhesive strips in place.  If a large square bandage is present, this may be removed 24 hours after surgery.  Check your puncture site every day for signs of infection. Check for: Redness, swelling, or pain. Fluid or blood. If your puncture site starts to bleed, lie down on your back,  apply firm pressure to the area, and contact your health care provider. Warmth. Pus or a bad smell. Driving Do not drive yourself home if you received sedation Do not drive for at least 4 days after your procedure or however long your health care provider recommends. (Do not resume driving if you have previously been instructed not to drive for other health reasons.) Do not spend greater than 1 hour at a time in a car for the first 3 days. Stop and take a break with a 5 minute walk at least every hour.  Do not drive or use heavy machinery while taking prescription pain medicine.  Activity Avoid activities that take a lot of effort, including exercise, for at least 7 days after your procedure. For the first 3 days, avoid sitting for longer than one hour at a time.  Avoid alcoholic beverages, signing paperwork, or participating in legal proceedings for 24 hours after receiving sedation Do not lift anything that is heavier than 10 lb (4.5 kg) for one week.  No sexual activity for 1 week.  Return to your normal activities as told by your health care provider. Ask your health care provider what activities are safe for you. General instructions Take over-the-counter and prescription medicines only as told by your health care provider. Do not use any products that contain nicotine or tobacco, such as cigarettes and e-cigarettes. If you need help quitting, ask your health care provider. You may shower after 24 hours, but Do not take baths, swim, or use a hot tub for 1 week.  Do not drink alcohol for 24 hours after your procedure. Keep all  follow-up visits as told by your health care provider. This is important. Dental Work: You will require antibiotics prior to any dental work, including cleanings, for 6 months after your Watchman implantation to help protect you from infection. After 6 months, antibiotics are no longer required. Contact a health care provider if: You have redness, mild swelling, or  pain around your puncture site. You have soreness in your throat or at your puncture site that does not improve after several days You have fluid or blood coming from your puncture site that stops after applying firm pressure to the area. Your puncture site feels warm to the touch. You have pus or a bad smell coming from your puncture site. You have a fever. You have chest pain or discomfort that spreads to your neck, jaw, or arm. You are sweating a lot. You feel nauseous. You have a fast or irregular heartbeat. You have shortness of breath. You are dizzy or light-headed and feel the need to lie down. You have pain or numbness in the arm or leg closest to your puncture site. Get help right away if: Your puncture site suddenly swells. Your puncture site is bleeding and the bleeding does not stop after applying firm pressure to the area. These symptoms may represent a serious problem that is an emergency. Do not wait to see if the symptoms will go away. Get medical help right away. Call your local emergency services (911 in the [place] Do not drive yourself to the hospital. Summary After the procedure, it is normal to have bruising and tenderness at the puncture site in your groin, neck, or forearm. Check your puncture site every day for signs of infection. Get help right away if your puncture site is bleeding and the bleeding does not stop after applying firm pressure to the area. This is a medical emergency.  This information is not intended to replace advice given to you by your health care provider. Make sure you discuss any questions you have with your health care provider.

## 2021-01-27 NOTE — Telephone Encounter (Signed)
Dr. [name] spoke with the patient's family post procedure.

## 2021-01-27 NOTE — Anesthesia Preprocedure Evaluation (Signed)
Anesthesia Evaluation  Patient identified by MRN, date of birth, ID band Patient awake    Reviewed: Allergy & Precautions, H&P , NPO status , Patient's Chart, lab work & pertinent test results  Airway Mallampati: II   Neck ROM: full    Dental   Pulmonary shortness of breath,    breath sounds clear to auscultation       Cardiovascular hypertension, + CAD  + dysrhythmias Atrial Fibrillation  Rhythm:irregular Rate:Normal     Neuro/Psych  Headaches, PSYCHIATRIC DISORDERS Anxiety Depression CVA    GI/Hepatic GERD  ,  Endo/Other  Hypothyroidism   Renal/GU      Musculoskeletal  (+) Arthritis ,   Abdominal   Peds  Hematology   Anesthesia Other Findings   Reproductive/Obstetrics                             Anesthesia Physical Anesthesia Plan  ASA: 3  Anesthesia Plan: General   Post-op Pain Management:    Induction: Intravenous  PONV Risk Score and Plan: 3 and Ondansetron, Dexamethasone and Treatment may vary due to age or medical condition  Airway Management Planned: Oral ETT  Additional Equipment:   Intra-op Plan:   Post-operative Plan: Extubation in OR  Informed Consent: I have reviewed the patients History and Physical, chart, labs and discussed the procedure including the risks, benefits and alternatives for the proposed anesthesia with the patient or authorized representative who has indicated his/her understanding and acceptance.     Dental advisory given  Plan Discussed with: CRNA, Anesthesiologist and Surgeon  Anesthesia Plan Comments:         Anesthesia Quick Evaluation

## 2021-01-27 NOTE — Progress Notes (Signed)
Echocardiogram Echocardiogram Transesophageal has been performed as part of the watchman procedure.  Oneal Deputy Alexandria Shiflett RDCS 01/27/2021, 11:38 AM

## 2021-01-27 NOTE — Discharge Summary (Signed)
HEART AND [facility]ASC[facility]ENTER   MULTIDISCIPLINARY HEART VALVE TEAM   STRUCTURAL HEART PROCEDURE DISCHARGE SUMMARY   Patient ID: Sandra [name],  MRN: [id number], DOB/AGE: [birth year] 76 y.o.  Admit date: 01/27/2021 Discharge date: 01/28/2021  Primary Care Physician: Jerrol Banana., MD  Primary Cardiologist: Nelva Bush, MD  Electrophysiologist: Vickie Epley, MD  Primary Discharge Diagnosis:  Paroxysmal Atrial Fibrillation Poor candidacy for long term anticoagulation due to h/o intracranial hemorrhage  Secondary Discharge Diagnosis:  HFpEF Breast Cancer Celiac Diseases Raunaud's disease RSL GERD Anxiety  Procedures This Admission:  Transeptal Puncture Intra-procedural TEE which showed no LAA thrombus Left atrial appendage occlusive device placement on 01/27/21 by Dr. [name].  This study demonstrated  IMPRESSIONS   1. Left ventricular ejection fraction, by estimation, is 60 to 65%. The  left ventricle has normal function. The left ventricle has no regional  wall motion abnormalities.   2. Right ventricular systolic function is normal. The right ventricular  size is normal.   3. Well-seated Watchman FLX 24 mm device, without peri-device leak. There  is a tiny mobile filamentous structure, maximum length 3 mm, diameter< 0.5  mm attached to the tip of the "coumadin ridge" that was seen throughout  the procedure, unchanged at the  end of the procedure. There is a small iatrogenic ASD with exclusively  left-to-right shunt after transseptal puncture. No left atrial/left atrial  appendage thrombus was detected.   4. The mitral valve is normal in structure. Mild mitral valve  regurgitation. No evidence of mitral stenosis.   5. The aortic valve is tricuspid. Aortic valve regurgitation is trivial.  Mild aortic valve sclerosis is present, with no evidence of aortic valve  stenosis.   6. The inferior vena cava is normal in size with greater than 50%  respiratory  variability, suggesting right atrial pressure of 3 mmHg.    Brief HPI: Sandra [name] is a 75 y.o. female with a history of Paroxysmal Atrial Fibrillation. The patient was seen as an outpatient and thought to be a poor candidate for continued anticoagulation due to history of intracranial hemorrhage. Risks, benefits, and alternatives to left atrial appendage occlusive device placement device were reviewed with the patient who wished to proceed.  The patient underwent intra-procedural TEE as above.   Hospital Course:  The patient was admitted and underwent Left Atrial appendage occlusive device placement with details as outlined above.  They were monitored on telemetry overnight which demonstrated sinus.  Groin was without complication on the day of discharge.  The patient was examined and considered to be stable for discharge.  Wound care and restrictions were reviewed with the patient.  The patient will be seen back by Nell Range PA-C in 1 month and a repeat TEE in approximately 45 days post procedure to ensure proper seal of the device. They will follow up with Dr. [name] in 6 months after procedure.   CHA2DS2/VAS Stroke Risk Points  Current as of 4 minutes ago    6 >= 2 Points: High Risk 1 - 1.99 Points: Medium Risk 0 Points: Low Risk   Last Change:     Details   This score determines the patient's risk of having a stroke if the patient has atrial fibrillation.    Points Metrics 1 Has Congestive Heart Failure:  Yes   Current as of 4 minutes ago 1 Has Vascular Disease:  Yes   Current as of 4 minutes ago 1 Has Hypertension:  Yes   Current as of  4 minutes ago 2 Age:  75   Current as of 4 minutes ago 0 Has Diabetes:  No   Current as of 4 minutes ago 0 Had Stroke:  No  Had TIA:  No  Had Thromboembolism:  No   Current as of 4 minutes ago 1 Female:  Yes   Current as of 4 minutes ago         The patient understands the importance of continuing on their Eliquis 2.35m BID without concurrent antiplatelet  therapy for 6 months. Based on her clinical status and 45 day TEE we will discuss an antiplatelet strategy moving forward.  Physical Exam: Vitals:   01/27/21 2000 01/28/21 0008 01/28/21 0300 01/28/21 0733  BP: 114/60 123/64 108/73 (!) 119/56  Pulse: 71 69 63   Resp: 18 17 16 18   Temp: 98.1 F (36.7 C) 97.8 F (36.6 C) 97.7 F (36.5 C) 97.8 F (36.6 C)  TempSrc: Oral Oral Oral Oral  SpO2: 91% 92% 95% 96%  Weight:      Height:        GEN- The patient is well appearing, alert and oriented x 3 today.   HEENT: normocephalic, atraumatic; sclera clear, conjunctiva pink; hearing intact; oropharynx clear; neck supple  Lungs- Clear to ausculation bilaterally, normal work of breathing.  No wheezes, rales, rhonchi Heart- Regular rate and rhythm, no murmurs, rubs or gallops  GI- soft, non-tender, non-distended, bowel sounds present  Extremities- no clubbing, cyanosis, or edema; Groin without hematoma/bruit MS- no significant deformity or atrophy Skin- warm and dry, no rash or lesion Psych- euthymic mood, full affect Neuro- strength and sensation are intact   Labs:   Lab Results  Component Value Date   WBC 5.5 12/30/2020   HGB 11.7 (L) 12/30/2020   HCT 36.8 12/30/2020   MCV 92.7 12/30/2020   PLT 252 12/30/2020    Recent Labs  Lab 01/28/21 0048  NA 137  K 3.6  CL 105  CO2 26  BUN 13  CREATININE 0.80  CALCIUM 9.0  GLUCOSE 147*     Discharge Medications:  Allergies as of 01/28/2021   No Known Allergies      Medication List     TAKE these medications    ALPRAZolam 0.5 MG tablet Commonly known as: XANAX Take 1 tablet (0.5 mg total) by mouth at bedtime.   amLODipine 10 MG tablet Commonly known as: NORVASC Take 5 mg by mouth daily.   apixaban 5 MG Tabs tablet Commonly known as: ELIQUIS Take 2.5 mg by mouth 2 (two) times daily. What changed: Another medication with the same name was removed. Continue taking this medication, and follow the directions you see  here.   atorvastatin 40 MG tablet Commonly known as: LIPITOR TAKE 1 TABLET BY MOUTH  DAILY   bisacodyl 5 MG EC tablet Generic drug: bisacodyl Take 5 mg by mouth daily as needed for moderate constipation.   CALCIUM 600 + D PO Take 1 tablet by mouth daily.   docusate sodium 100 MG capsule Commonly known as: COLACE Take 100 mg by mouth daily as needed for mild constipation.   flecainide 50 MG tablet Commonly known as: TAMBOCOR Take 1 tablet (50 mg total) by mouth 2 (two) times daily.   hydroxypropyl methylcellulose / hypromellose 2.5 % ophthalmic solution Commonly known as: ISOPTO TEARS / GONIOVISC Place 1 drop into both eyes 4 (four) times daily as needed for dry eyes.   latanoprost 0.005 % ophthalmic solution Commonly known as: XALATAN Place 1  drop into both eyes at bedtime.   letrozole 2.5 MG tablet Commonly known as: FEMARA Take 1 tablet (2.5 mg total) by mouth daily.   levothyroxine 100 MCG tablet Commonly known as: SYNTHROID TAKE 1 TABLET BY MOUTH 30  MINUTES BEFORE BREAKFAST   metoprolol succinate 25 MG 24 hr tablet Commonly known as: Toprol XL Take 0.5 tablets (12.5 mg total) by mouth daily. At bedtime   multivitamin with minerals Tabs tablet Take 1 tablet by mouth daily at 3 pm.   omeprazole 40 MG capsule Commonly known as: PRILOSEC TAKE 1 CAPSULE BY MOUTH  DAILY   polyethylene glycol 17 g packet Commonly known as: MIRALAX / GLYCOLAX Take 17 g by mouth daily as needed for moderate constipation.   sertraline 100 MG tablet Commonly known as: ZOLOFT TAKE 1 TABLET BY MOUTH  DAILY   timolol 0.5 % ophthalmic solution Commonly known as: BETIMOL Place 1 drop into both eyes 2 (two) times daily.   Vitamin D3 125 MCG (5000 UT) Tabs Take 5,000 Units by mouth daily.        Disposition:  home   Follow-up Information     Eileen Stanford, PA-C. Go on 02/01/2021.   Specialties: Cardiology, Radiology Why: @ 1:30pm, please arrive at least 10 minutes  early Contact information: [place] [address] [phone]                 Duration of Discharge Encounter: Greater than 30 minutes including physician time.  Signed, Angelena Form, PA-C  01/28/2021 10:03 AM

## 2021-01-27 NOTE — Op Note (Signed)
  [facility]   MULTIDISCIPLINARY HEART TEAM  Watchman [place] Procedure   Surgeon: Lars Mage, MD Co-Surgeon: Sherren Mocha, MD Anesthesia: Albertha Ghee, MD Imager: Sanda Klein, MD   Procedure: 1) Transseptal Puncture 2) Watchman left atrial appendage occlusion 3) Perclose right femoral vein   Device Implant: 24 mm Watchman FLX Device   Background and Indication: 75 yo with paroxysmal atrial fibrillation, prior intracranial bleeding, felt to be a poor candidate for long term anticoagulation. She is referred for the above procedure   Procedure description: US guidance is used for right femoral venous access. [place] images are stored digitally in the patient's chart. Double Preclose is performed at 10" and 2" positions using normal technique and a after progressively dilating the femoral vein over a Versacross wire, a 16 Fr sheath is inserted. Transseptal puncture is then performed after fully anticoagulating the patient with unfractionated heparin. A therapeutic ACT greater than 250 seconds is achieved. A Versacross system is used with RF energy to cross the interatrial septum under fluoroscopic and TEE guidance. Please see Dr Mardene Speak detailed report for further description of transseptal puncture and Watchman Access Sheath insertion.   Once the 14 Fr access sheath is in appropriate position in the left atrial appendage, a 24 mm Watchman Flex device is inserted and deployed, demonstrating appropriate position and compression in the left atrial appendage. Thorough TEE imaging is performed to evaluate all PASS criteria prior to device release. Left atrial angiography is performed and confirms appropriate device position with no uncovered lobes. After device release, the device remains in stable position with complete occlusion of the appendage. The sheath is removed from the body and the previously deployed perclose sutures are tightened for complete hemostasis.    Conclusion: Successful implantation of a 24 mm Watchman left atrial appendage occlusion device using fluoroscopic and transesophageal echo guidance  Sherren Mocha 01/27/2021 11:29 AM

## 2021-01-27 NOTE — H&P (Signed)
Electrophysiology Office Note:     Date:  08/25/2020    ID:  Sandra [name], DOB 1946/04/16, MRN [id number]   PCP:  Jerrol Banana., MD    [facility] Cardiologist:  Nelva Bush, MD  [facility] Electrophysiologist:  Vickie Epley, MD    Referring MD: Nelva Bush, MD    Chief Complaint: Atrial fibrillation and history of falls and intracranial hemorrhage   History of Present Illness:     Sandra [name] is a 75 y.o. female who presents for an evaluation of atrial fibrillation and history of intracranial hemorrhage at the request of Dr. [name]. Their medical history includes multiple falls, hypertension, CAD.   She has a past medical history of intracranial hemorrhage while taking Eliquis.  Her intracranial hemorrhage was in February 2022.  When she presented she had several days of symptoms.  Upon arrival her anticoagulation was reversed with Kcentra and her blood pressure was aggressively controlled.  Repeat CT imaging of the brain showed a stable hemorrhage.  She was taken off her anticoagulant and remains on aspirin 81 mg by mouth daily.   Today she tells me she has had at least 2 falls.  The falls appear to be mechanical in nature.  No syncopal history. She tells me that she had not had a recent fall at the time of her intracranial hemorrhage.   She is accompanied by a friend today in clinic and her daughter is on the telephone during the visit.       Past Medical History:  Diagnosis Date   (HFpEF) heart failure with preserved ejection fraction ([place])      a. 08/2019 Echo: EF 60-65%, no rwma, mild LVH, Gr2 DD, nl RV fxn. Nl pASP. Mildly dil LA. Mild to mod MR.   Anemia     Anxiety     Arthritis     BRCA gene mutation negative 10/2016    NEGATIVE: [facility]   Breast cancer ([place]) 10/16/2016    T1c,N0;, GRADE I/III, 1.6cm. ER/PR pos  HER2 not over expressed, Right Upper Outer   Celiac disease     Depression     Dyspnea      WITH EXERTION   GERD  (gastroesophageal reflux disease)      OCC   Glaucoma      right eye   Headache      H/O MIGRAINES AS TEENAGER   Heart murmur      a. 08/2019 Echo: mild to mod MR.   Hypertension     Hypothyroidism     Non-obstructive CAD (coronary artery disease)      a. 01/2017 MV: Hypertensive response. No ischemia/infarct. EF >65%; b. 09/2019 Cor CTA: Ca2+ = 9.29 (36th percentile). LAD calcified plaque (0-24%), otw nl. Multipel bilat pulm nodules up to 69m.   PAF (paroxysmal atrial fibrillation) ([place]      a. 02/2020 Zio: predominantly RSR, 65 (50-105), rare PACs/PVCs, 8 beats NSVT, multiple episodes of PAF lasting up to 1hr 247ms. Avg AF rate 130 (93-170). AF burden <1%. Triggered events = RSR, PACs, and PAF; b. CHA2DS2VASc = 6.   Pneumonia      YEARS AGO   Pulmonary nodules      a. 09/2019 Cor CTA: incidental finding of multiple bilat pulm nodules; b. 12/2019 High Res CT: stable, scattered solid pulm nodules.   Raynaud's disease     RLS (restless legs syndrome)             Past Surgical  History:  Procedure Laterality Date   ABDOMINAL HYSTERECTOMY       APPENDECTOMY       AXILLARY LYMPH NODE BIOPSY Right 03/05/2017    Procedure: AXILLARY LYMPH NODE BIOPSY;  Surgeon: Robert Bellow, MD;  Location: [facility] ORS;  Service: General;  Laterality: Right;   CATARACT EXTRACTION Bilateral     CESAREAN SECTION       COLONOSCOPY WITH PROPOFOL N/A 05/19/2015    Procedure: COLONOSCOPY WITH PROPOFOL;  Surgeon: Manya Silvas, MD;  Location: [facility] ENDOSCOPY;  Service: Endoscopy;  Laterality: N/A;   COLONOSCOPY WITH PROPOFOL N/A 08/01/2019    Procedure: COLONOSCOPY WITH PROPOFOL;  Surgeon: Lucilla Lame, MD;  Location: [facility] ENDOSCOPY;  Service: Endoscopy;  Laterality: N/A;   EYE SURGERY       HAND SURGERY Left     MASTECTOMY Right 11/07/2016    RESIDUAL INVASIVE MAMMARY CARCINOMA, SUBAREOLAR ANTERIOR TO PREVIOUS    SENTINEL NODE BIOPSY Right 11/07/2016    Procedure: SENTINEL NODE BIOPSY;  Surgeon: Robert Bellow, MD;  Location: [facility] ORS;  Service: General;  Laterality: Right;   SIMPLE MASTECTOMY WITH AXILLARY SENTINEL NODE BIOPSY Right 11/07/2016    6 mm ER/PR 100%; Her 2 neu not overexpressed, T1b, N0.  Surgeon: Robert Bellow, MD;  Location: [facility] ORS;  Service: General;  Laterality: Right;   TONSILLECTOMY   AGE 82   UPPER GI ENDOSCOPY   01/12/06    hiatus hernia      Current Medications: Active Medications      Current Meds  Medication Sig   acetaminophen (TYLENOL) 500 MG tablet Take 500-1,000 mg by mouth every 6 (six) hours as needed for mild pain or moderate pain.   ALPRAZolam (XANAX) 0.5 MG tablet TAKE 1 TABLET BY MOUTH AT  BEDTIME   amLODipine (NORVASC) 10 MG tablet Take 1 tablet (10 mg total) by mouth daily.   atorvastatin (LIPITOR) 40 MG tablet Take 40 mg by mouth daily.   Calcium Carb-Cholecalciferol 500-400 MG-UNIT TABS Take 1-2 tablets by mouth daily.   carvedilol (COREG) 6.25 MG tablet Take 1 tablet (6.25 mg total) by mouth 2 (two) times daily with a meal.   Cholecalciferol (VITAMIN D3) 125 MCG (5000 UT) TABS Take 125 Units by mouth daily.   hydroxypropyl methylcellulose / hypromellose (ISOPTO TEARS / GONIOVISC) 2.5 % ophthalmic solution Place 1 drop into both eyes 4 (four) times daily as needed for dry eyes.   latanoprost (XALATAN) 0.005 % ophthalmic solution Place 1 drop into both eyes at bedtime.   letrozole (FEMARA) 2.5 MG tablet Take 1 tablet (2.5 mg total) by mouth daily.   levothyroxine (SYNTHROID) 100 MCG tablet TAKE 1 TABLET BY MOUTH 30  MINUTES BEFORE BREAKFAST (Patient taking differently: TAKE 1 TABLET BY MOUTH 30  MINUTES BEFORE BREAKFAST)   LOW-DOSE ASPIRIN PO Take 81 mg by mouth daily.   Multiple Vitamin (MULTIVITAMIN WITH MINERALS) TABS tablet Take 1 tablet by mouth daily at 3 pm.   omeprazole (PRILOSEC) 40 MG capsule TAKE 1 CAPSULE BY MOUTH  DAILY   sennosides-docusate sodium (SENOKOT-S) 8.6-50 MG tablet Take 1 tablet by mouth as needed for constipation.    sertraline (ZOLOFT) 100 MG tablet TAKE 1 TABLET BY MOUTH  DAILY   timolol (TIMOPTIC) 0.5 % ophthalmic solution Place 1 drop into both eyes every morning.        Allergies:   Patient has no known allergies.    Social History         Socioeconomic History  Marital status: Married      Spouse name: Nadara Mustard   Number of children: 2   Years of education: Not on file   Highest education level: Some college, no degree  Occupational History   Occupation: retired  Tobacco Use   Smoking status: Never Smoker   Smokeless tobacco: Never Used  Scientific laboratory technician Use: Never used  Substance and Sexual Activity   Alcohol use: Not Currently   Drug use: No   Sexual activity: Not Currently      Birth control/protection: None  Other Topics Concern   Not on file  Social History Narrative    Lives with Husband    Right handed    Drinks 2-3 cups caffiene daily    Social Determinants of Health       Financial Resource Strain: Low Risk    Difficulty of Paying Living Expenses: Not hard at all  Food Insecurity: No Food Insecurity   Worried About Charity fundraiser in the Last Year: Never true   [place] in the Last Year: Never true  Transportation Needs: No Transportation Needs   Lack of Transportation (Medical): No   Lack of Transportation (Non-Medical): No  Physical Activity: Inactive   Days of Exercise per Week: 0 days   Minutes of Exercise per Session: 0 min  Stress: No Stress Concern Present   Feeling of Stress : Only a little  Social Connections: Engineer, building services of Communication with Friends and Family: More than three times a week   Frequency of Social Gatherings with Friends and Family: More than three times a week   Attends Religious Services: More than 4 times per year   Active Member of [employer] or Organizations: Yes   Attends Music therapist: More than 4 times per year   Marital Status: Married      Family History: The patient's family  history includes Alcohol abuse in her father; Anemia in her mother; Breast cancer in her sister; Breast cancer (age of onset: 54) in her maternal grandmother; CAD in her paternal grandfather; Cancer in her mother and sister; Heart attack in her maternal grandfather; Other in her mother; Prostate cancer in her father; Stroke in her maternal grandmother.   ROS:   Please see the history of present illness.    All other systems reviewed and are negative.   EKGs/Labs/Other Studies Reviewed:     The following studies were reviewed today:   Hospitalization records   July 16, 2020 echo personally reviewed Left ventricular function normal, 65% Right ventricular function normal Mildly dilated left and right atria No significant valvular abnormalities   July 28, 2020 CT head IMPRESSION: Resolving left cerebellar intraparenchymal hematoma. No interval hemorrhage. No associated abnormal mass effect.   EKG:  The ekg ordered today demonstrates sinus rhythm   Recent Labs: 03/17/2020: Magnesium 2.2 07/22/2020: ALT 25; BUN 14; Creatinine, Ser 0.68; Hemoglobin 12.2; Platelets 254; Potassium 4.1; Sodium 142; TSH 5.570  Recent Lipid Panel Labs (Brief)          Component Value Date/Time    CHOL 137 07/17/2020 0436    CHOL 133 04/28/2020 1039    TRIG 95 07/17/2020 0436    HDL 50 07/17/2020 0436    HDL 50 04/28/2020 1039    CHOLHDL 2.7 07/17/2020 0436    VLDL 19 07/17/2020 0436    LDLCALC 68 07/17/2020 0436    LDLCALC 67 04/28/2020 1039  Physical Exam:     VS:  BP 120/60 (BP Location: Left Arm, Patient Position: Sitting, Cuff Size: Normal)   Pulse 61   Ht 5' 3"  (1.6 m)   Wt 134 lb (60.8 kg)   SpO2 98%   BMI 23.74 kg/m         Wt Readings from Last 3 Encounters:  08/25/20 134 lb (60.8 kg)  08/24/20 133 lb 6 oz (60.5 kg)  08/16/20 139 lb (63 kg)      GEN:  Well nourished, well developed in no acute distress HEENT: Normal NECK: No JVD; No carotid bruits LYMPHATICS: No  lymphadenopathy CARDIAC: RRR, no murmurs, rubs, gallops RESPIRATORY:  Clear to auscultation without rales, wheezing or rhonchi  ABDOMEN: Soft, non-tender, non-distended MUSCULOSKELETAL:  No edema; No deformity  SKIN: Warm and dry NEUROLOGIC:  Alert and oriented x 3 PSYCHIATRIC:  Normal affect    ASSESSMENT:     1. PAF (paroxysmal atrial fibrillation) (HCC)     PLAN:     In order of problems listed above:   Paroxysmal atrial fibrillation Currently off anticoagulation given history of intracranial hemorrhage.  Currently taking aspirin 81 mg by mouth once daily.   During today's visit we spoke at length about the options for stroke prophylaxis given her history of atrial fibrillation and CHA2DS2-VASc of at least 5.  We discussed continuing on aspirin 81 mg by mouth once daily, rechallenging with anticoagulation or using a left atrial appendage occlusion device (watchman).  We discussed the risks and benefits of each option.  I do think she would be an excellent candidate for the watchman device but I will need to consult with the neurology team to see when they think it would be feasible for her to be on a short course of anticoagulation.  Given her history of intracranial hemorrhage, would favor using a reduced dose of a NOAC (Eliquis 2.5 mg by mouth twice daily) or DAPT only after watchman implant.  I discussed these alternative anticoagulation/antiplatelet regimens with the patient during today's visit.  I think it would be a good idea for her to touch base with the neurology team in approximately 3 months and then I will plan to see her back in the 1 or 2 months after that appointment.  If she is still stable at that time and they think it is feasible for her to undergo the watchman implant, we can proceed with scheduling.  The patient and her family were very agreeable to this plan.    ---------------------------------------  I have seen, examined the patient, and reviewed the above  assessment and plan.    Plan for [place] today.  Procedural risks for the Watchman implant have been reviewed with the patient including a 0.5% risk of stroke, <1% risk of perforation and <1% risk of device embolization.    Vickie Epley, MD 01/27/2021 8:58 AM

## 2021-01-27 NOTE — Transfer of Care (Signed)
Immediate Anesthesia Transfer of Care Note  Patient: JENITA RAYFIELD  Procedure(s) Performed: LEFT ATRIAL APPENDAGE OCCLUSION TRANSESOPHAGEAL ECHOCARDIOGRAM (TEE)  Patient Location: Cath Lab  Anesthesia Type:General  Level of Consciousness: drowsy and patient cooperative  Airway & Oxygen Therapy: Patient Spontanous Breathing and Patient connected to nasal cannula oxygen  Post-op Assessment: Report given to RN and Post -op Vital signs reviewed and stable  Post vital signs: Reviewed and stable  Last Vitals:  Vitals Value Taken Time  BP 139/59 01/27/21 1132  Temp    Pulse 55 01/27/21 1133  Resp 14 01/27/21 1133  SpO2 96 % 01/27/21 1133  Vitals shown include unvalidated device data.  Last Pain:  Vitals:   01/27/21 0812  TempSrc:   PainSc: 0-No pain         Complications: No notable events documented.

## 2021-01-28 DIAGNOSIS — I480 Paroxysmal atrial fibrillation: Principal | ICD-10-CM

## 2021-01-28 LAB — BASIC METABOLIC PANEL
Anion gap: 6 (ref 5–15)
BUN: 13 mg/dL (ref 8–23)
CO2: 26 mmol/L (ref 22–32)
Calcium: 9 mg/dL (ref 8.9–10.3)
Chloride: 105 mmol/L (ref 98–111)
Creatinine, Ser: 0.8 mg/dL (ref 0.44–1.00)
GFR, Estimated: >60 mL/min (ref >60)
Glucose, Bld: 147 mg/dL — ABNORMAL HIGH (ref 70–99)
Potassium: 3.6 mmol/L (ref 3.5–5.1)
Sodium: 137 mmol/L (ref 135–145)

## 2021-01-28 NOTE — Plan of Care (Signed)
  Problem: Education: Goal: Knowledge of General Education information will improve Description: Including pain rating scale, medication(s)/side effects and non-pharmacologic comfort measures Outcome: Adequate for Discharge   Problem: Health Behavior/Discharge Planning: Goal: Ability to manage health-related needs will improve Outcome: Adequate for Discharge   Problem: Clinical Measurements: Goal: Ability to maintain clinical measurements within normal limits will improve Outcome: Adequate for Discharge Goal: Will remain free from infection Outcome: Adequate for Discharge Goal: Diagnostic test results will improve Outcome: Adequate for Discharge Goal: Respiratory complications will improve Outcome: Adequate for Discharge Goal: Cardiovascular complication will be avoided Outcome: Adequate for Discharge   Problem: Clinical Measurements: Goal: Will remain free from infection Outcome: Adequate for Discharge   Problem: Activity: Goal: Risk for activity intolerance will decrease Outcome: Adequate for Discharge   Problem: Nutrition: Goal: Adequate nutrition will be maintained Outcome: Adequate for Discharge   Problem: Coping: Goal: Level of anxiety will decrease Outcome: Adequate for Discharge   Problem: Elimination: Goal: Will not experience complications related to bowel motility Outcome: Adequate for Discharge Goal: Will not experience complications related to urinary retention Outcome: Adequate for Discharge   Problem: Pain Managment: Goal: General experience of comfort will improve Outcome: Adequate for Discharge

## 2021-01-28 NOTE — Anesthesia Postprocedure Evaluation (Signed)
Anesthesia Post Note  Patient: Sandra [name]  Procedure(s) Performed: LEFT ATRIAL APPENDAGE OCCLUSION TRANSESOPHAGEAL ECHOCARDIOGRAM (TEE)     Patient location during evaluation: Cath Lab Anesthesia Type: General Level of consciousness: awake and alert Pain management: pain level controlled Vital Signs Assessment: post-procedure vital signs reviewed and stable Respiratory status: spontaneous breathing, nonlabored ventilation, respiratory function stable and patient connected to nasal cannula oxygen Cardiovascular status: blood pressure returned to baseline and stable Postop Assessment: no apparent nausea or vomiting Anesthetic complications: no   No notable events documented.  Last Vitals:  Vitals:   01/28/21 0300 01/28/21 0733  BP: 108/73 (!) 119/56  Pulse: 63   Resp: 16 18  Temp: 36.5 C 36.6 C  SpO2: 95% 96%    Last Pain:  Vitals:   01/28/21 0733  TempSrc: Oral  PainSc: 0-No pain                 Noe Pittsley S

## 2021-02-01 ENCOUNTER — Telehealth: Payer: Self-pay

## 2021-02-01 NOTE — Telephone Encounter (Signed)
MR please advise. Thanks   This is Sandra [name], [name]'s daughter. I see on her MyChart she is scheduled for a chest CT on 02/14/21. I was thinking her follow up (as per her notes from the March visit) would put her next scan for December. Futher, she has since had a Watchman device implanted on 01/27/21 and was required to have a cardiac CT done before that procedure. I read the report and it looks like the nodules are stable, but was wondering if Dr R could review the result from that cardiac CT and see if that will suffice for now or if she needs to proceed with the one scheduled. Either way his follow-up indicated the earliest wouldn't be until December. Thank for clarifying!

## 2021-02-01 NOTE — Telephone Encounter (Signed)
  [facility]   Watchman Team  Contacted the patient regarding discharge from [facility] on January 28, 2021.  The patient understands to follow up with Nell Range, PA on 03/03/2021 in preparation for post-LAAO TEE on 03/10/2021.  The patient understands discharge instructions? Yes  The patient understands medications and regimen? Yes. She is taking Eliquis 2.5 mg BID with no concerns.  The patient reports groin sites look good. No S/S of bleeding or infection. The sites are a little tender but are improving every day.  The patient understands to call with any questions or concerns prior to scheduled follow-up visit.

## 2021-02-03 ENCOUNTER — Telehealth: Payer: Self-pay | Admitting: Cardiology

## 2021-02-03 NOTE — Telephone Encounter (Signed)
  Sandra [name] asked the question. Appreciate that. Nodules all stable 2021 -> March 2022 -> July 2022  Plan - cancel 02/14/21 CT  - can get CT in 6 or 9 months   xxxxxxxxxxxxxxxxxxxxxxxxx  EXAM: OVER-READ INTERPRETATION  CT CHEST   The following report is an over-read performed by radiologist Dr. Zetta Bills of [facility], [place] on 12/23/2020. This over-read does not include interpretation of cardiac or coronary anatomy or pathology. The coronary calcium score/coronary CTA interpretation by the cardiologist is attached.   COMPARISON:  July 27, 2020.   FINDINGS: Vascular: Please see dedicated report for cardiovascular details.   Mediastinum/Nodes: Visualized esophagus is unremarkable.   No hilar adenopathy within the visualized LEFT and RIGHT hilum. No mass adenopathy within the visualized mediastinum.   Lungs/Pleura: LEFT lower lobe pulmonary nodule (image 42/2) 4 mm, unchanged since March of 2022.   Nodule along the minor fissure in the RIGHT chest (image 22/7) approximately 7 mm. Unchanged.   RIGHT middle lobe nodule (image 18/7) unchanged at approximately 7 mm as well. No signs of consolidation. Airways, visible portions of airways are patent. Small LEFT upper lobe nodule 3-4 mm on image 8 of series 7 also unchanged.   Upper Abdomen: Incidental imaging of upper abdominal contents without acute process.   Musculoskeletal: RIGHT mastectomy partially visualized. No acute bone finding or destructive bone process.   IMPRESSION: 1. Multiple bilateral pulmonary nodules stable since prior chest CT and mostly stable compared to exams dating back to 2021. Continued attention on follow-up may be helpful in this patient with history of breast cancer though findings appear stable over slightly greater than 1 year with respect to visualized portions of the chest. 2. RIGHT mastectomy partially visualized.   Electronically Signed: By: Zetta Bills M.D. On:  12/23/2020 15:58

## 2021-02-03 NOTE — Telephone Encounter (Signed)
Pt was calling to speak with Dr. Mardene Speak RN about complications experienced today. Pt is s/p Watchman procedure on 9/1.  Pt states overall, since the procedure, she has been doing well until today. Pt states when she woke up this morning,  experienced mid sternal chest burning, as if she felt like she was having bad heart burn. Pt states she got up and started moving around, and chest discomfort went away.  Pt then states around 3 pm, she started feeling fluttering and palpitations, and noted on her home monitor she was in afib.  Pt states she can still feel the fluttering in her chest.  Pt states her HR's are running in the low 100s, irregular.  She has mentioned her last few HR/BP recordings as documented in this encounter.  Pt also states she has a headache and dizziness.  Pt denies sob/doe.  She denies N/V, diaphoresis, weakness, pre-syncopal or syncopal episodes.  Pt states she is anxious about symptoms experienced, given her history of brain bleed and recent Watchman procedure. Pt would like advisement on what she should do about complaints.  She is asking if she should go to her local hospital, but will await response from Dr. Mardene Speak RN before proceeding.  Informed the pt that I will route this message to Dr. Mardene Speak RN for her review and follow-up with the pt.  Pt verbalized understanding and agrees with this plan.

## 2021-02-03 NOTE — Telephone Encounter (Signed)
Returned call to pt.  Pt states her heart rate is now under 100.  Still in afib.  Pt normally takes her Toprol XL 12.5 mg by mouth at bedtime.  Advised she could take it a little early today and that might help.  Pt agrees.  Pt was reassured and understands to call if any further concerns.

## 2021-02-03 NOTE — Telephone Encounter (Signed)
Patient c/o Palpitations:  High priority if patient c/o lightheadedness, shortness of breath, or chest pain  How long have you had palpitations/irregular HR/ Afib? Are you having the symptoms now? Started 15 minutes ago, having symptoms now   Are you currently experiencing lightheadedness, SOB or CP? Lightheaded, had chest pain in center of chest this morning when laying down   Do you have a history of afib (atrial fibrillation) or irregular heart rhythm? Yes   Have you checked your BP or HR? (document readings if available):  02/03/21 3:00 PM 144/69 HR 101 135/74 HR 103 few mins later 129/71 HR 109 couple minutes later 133/77 HR 112 3:05 PM   Are you experiencing any other symptoms? Headache

## 2021-02-04 NOTE — Telephone Encounter (Signed)
Pt notified of [facility] response

## 2021-02-04 NOTE — Telephone Encounter (Signed)
Spoke with the pt and notified of the response per MR  She verbalized understanding  PCC's can you please reschedule her CT per MR's recs   Plan - cancel 02/14/21 CT  - can get CT in 6 or 9 months

## 2021-02-04 NOTE — Telephone Encounter (Signed)
This has been cancelled and placed in our November for December CT's

## 2021-02-09 ENCOUNTER — Ambulatory Visit: Payer: Medicare Other | Admitting: Cardiology

## 2021-02-14 ENCOUNTER — Other Ambulatory Visit: Payer: Self-pay | Admitting: Family Medicine

## 2021-02-14 ENCOUNTER — Ambulatory Visit: Payer: Medicare Other

## 2021-02-14 DIAGNOSIS — F419 Anxiety disorder, unspecified: Secondary | ICD-10-CM

## 2021-02-14 NOTE — Telephone Encounter (Signed)
LOV: 01/19/2021 NOV: 06/22/2021   Last Refill 12/21/2020 #90 0 Refills.   Thanks,   -Mickel Baas

## 2021-02-14 NOTE — Telephone Encounter (Signed)
Requested medication (s) are due for refill today - no  Requested medication (s) are on the active medication list -yes  Future visit scheduled -yes  Last refill: 12/21/20 #90  Notes to clinic: Request RF: non delegated Rx  Requested Prescriptions  Pending Prescriptions Disp Refills   ALPRAZolam (XANAX) 0.5 MG tablet [Pharmacy Med Name: ALPRAZolam 0.5 MG Oral Tablet] 90 tablet     Sig: TAKE 1 TABLET BY MOUTH AT  BEDTIME     Not Delegated - Psychiatry:  Anxiolytics/Hypnotics Failed - 02/14/2021 11:39 AM      Failed - This refill cannot be delegated      Failed - Urine Drug Screen completed in last 360 days      Passed - Valid encounter within last 6 months    Recent Outpatient Visits           3 weeks ago Hemorrhagic stroke [facility])   [facility] Jerrol Banana., MD   4 months ago Encounter for [employer] annual wellness exam   [facility] Jerrol Banana., MD   6 months ago Cerebellar hemorrhage, acute [facility])   [facility] Jerrol Banana., MD   6 months ago Hemorrhagic stroke [facility])   [facility] Jerrol Banana., MD   7 months ago [place] Rosanna Randy, Retia Passe., MD       Future Appointments             In 2 weeks Eileen Stanford, PA-C [place], [place]   In 4 months Rosanna Randy, Retia Passe., MD [facility], [place]   In 5 months Vickie Epley, MD [facility], [facility]               Requested Prescriptions  Pending Prescriptions Disp Refills   ALPRAZolam Duanne Moron) 0.5 MG tablet [Pharmacy Med Name: ALPRAZolam 0.5 MG Oral Tablet] 90 tablet     Sig: TAKE 1 TABLET BY MOUTH AT  BEDTIME     Not Delegated - Psychiatry:  Anxiolytics/Hypnotics Failed - 02/14/2021 11:39 AM      Failed - This refill cannot be delegated      Failed - Urine Drug Screen completed in last 360 days      Passed - Valid  encounter within last 6 months    Recent Outpatient Visits           3 weeks ago Hemorrhagic stroke [facility])   [facility] Jerrol Banana., MD   4 months ago Encounter for [employer] annual wellness exam   [facility] Jerrol Banana., MD   6 months ago Cerebellar hemorrhage, acute [facility])   [facility] Jerrol Banana., MD   6 months ago Hemorrhagic stroke [facility])   [facility] Jerrol Banana., MD   7 months ago [place] Rosanna Randy, Retia Passe., MD       Future Appointments             In 2 weeks Eileen Stanford, PA-C [place], [place]   In 4 months Rosanna Randy, Retia Passe., MD [facility], [place]   In 5 months Vickie Epley, MD [facility], [place]

## 2021-02-15 ENCOUNTER — Encounter: Payer: Self-pay | Admitting: Family Medicine

## 2021-02-18 ENCOUNTER — Telehealth: Payer: Self-pay | Admitting: Cardiology

## 2021-02-18 NOTE — Telephone Encounter (Signed)
  Pt is calling back, still waiting a callback from the nurse today. She said it is concerning about an Afib episode she has this week. She wanted to know if she needs to  change some of her meds

## 2021-02-18 NOTE — Telephone Encounter (Signed)
Pt called to let us know that she had some news that came to her about her husbands health this past Tuesday that gave her a lot of anxiety.. she had Afib the entire day on Wednesday with her HR up to 115, BP 137/74.. she was light headed and SOB but pushed through it and it was gone when she woke up Thursday morning.   She was asking if she should make any med adjustments.. she says she has felt well ever since.. I advised her to  continue to monitor for any further episodes and she will try taking her Metoprolol in the morning instead of at bedtime but since she feels the anxiety is her trigger she will try to relax and let us know early next week if any further problems.    Will forward to the Afib clinic and Dr. [name] for any further recommendations.

## 2021-02-18 NOTE — Telephone Encounter (Signed)
   Pt requesting to speak with Dr. Mardene Speak nurse. She don't want to provide anymore information, she just wanted to speak with a nurse

## 2021-02-21 NOTE — Telephone Encounter (Signed)
Responded via MyChart.

## 2021-03-01 ENCOUNTER — Telehealth: Payer: Self-pay | Admitting: Internal Medicine

## 2021-03-01 ENCOUNTER — Encounter (HOSPITAL_COMMUNITY): Payer: Self-pay | Admitting: Internal Medicine

## 2021-03-01 NOTE — Telephone Encounter (Signed)
Patient c/o Palpitations:  High priority if patient c/o lightheadedness, shortness of breath, or chest pain  How long have you had palpitations/irregular HR/ Afib? Are you having the symptoms now? Yes, since 11:30a  Are you currently experiencing lightheadedness, SOB or CP? Some lightheadedness - unable to get deep breath but not really SOB  Do you have a history of afib (atrial fibrillation) or irregular heart rhythm? yes  Have you checked your BP or HR? (document readings if available):  12p 137/70 HR 105  Are you experiencing any other symptoms? no

## 2021-03-01 NOTE — Telephone Encounter (Signed)
Spoke with pt.  BP/HR now 112/66 HR 104 States she has "slight light headedness and mild SOB." Pt takes Metoprolol Succinate 12.5 mg daily. Pt takes in AM now.  Pt did take morning dose. Pt took and extra metoprolol 12.5 mg prior to calling office.  Denies chest pain.  Advised pt with extra dose, should help with HR.  Advised to monitor BP/HR and symptoms. If HR increases and/or worsening SOB, light headedness or develops chest pain to go to the emergency room. Otherwise, asked pt to follow up with s/s tomorrow.   Pt voiced understanding, appreciative of call and has no further questions.

## 2021-03-01 NOTE — Progress Notes (Signed)
Attempted to obtain medical history via telephone, unable to reach at this time. I left a voicemail to return pre surgical testing department's phone call.  

## 2021-03-03 ENCOUNTER — Ambulatory Visit: Payer: Medicare Other | Admitting: Cardiology

## 2021-03-03 ENCOUNTER — Other Ambulatory Visit: Payer: Self-pay

## 2021-03-03 ENCOUNTER — Ambulatory Visit: Payer: Medicare Other | Admitting: Physician Assistant

## 2021-03-03 ENCOUNTER — Encounter: Payer: Self-pay | Admitting: Cardiology

## 2021-03-03 VITALS — BP 112/68 | HR 58 | Ht 63.0 in | Wt 137.2 lb

## 2021-03-03 DIAGNOSIS — Z95818 Presence of other cardiac implants and grafts: Secondary | ICD-10-CM | POA: Diagnosis not present

## 2021-03-03 DIAGNOSIS — I619 Nontraumatic intracerebral hemorrhage, unspecified: Secondary | ICD-10-CM

## 2021-03-03 DIAGNOSIS — I480 Paroxysmal atrial fibrillation: Secondary | ICD-10-CM | POA: Diagnosis not present

## 2021-03-03 MED ORDER — FLECAINIDE ACETATE 100 MG PO TABS: 100.0000 mg | ORAL_TABLET | Freq: Two times a day (BID) | ORAL | 3 refills | Status: DC

## 2021-03-03 NOTE — Progress Notes (Deleted)
[facility] [name]                                     Cardiology Office Note:    Date:  03/03/2021   ID:  Sandra [name], DOB 15-Aug-1945, MRN [id number]  PCP:  Jerrol Banana., MD  [facility] Cardiologist:  Nelva Bush, MD  [facility] Electrophysiologist:  Vickie Epley, MD   Referring MD: Jerrol Banana.,*   No chief complaint on file. ***  History of Present Illness:    Sandra [name] is a 75 y.o. female with a hx of paroxysmal atrial fibrillation, hx of intracranial hemorrhage, HTN, and CAD.   She was initially referred to Dr. [name] from Dr. [name] for possible Watchman implant. She had an intracranial hemorrhage while taking Eliquis after suffering a mechanical fall. She presented after several days of symptoms. Upon arrival her anticoagulation was reversed with Kcentra and her BP was aggressively controlled. Repeat CT brain imaging showed stable hemorrhage. She was taken off her anticoagulant and remained on aspirin 81 mg by mouth daily. After recovery, she was referred to D.r [name] given her high risk of bleeding on AC. CHA2DS2VASc score calculated at least 5.    She was seen by Dr. Lurline Hare 08/25/20 at which time she was felt to be a good Watchman candidate. Plan was to touch base with neurology and see her back in the office. If stable plan was to pursue Watchman. She was then seen by the AF clinic 12/30/20 and she was set up for Watchman implant .01/27/21. Her ASA was stopped and she was started on Eliquis 2.21m BID. She tolerated this well with no bleeding.   She underwent Watchman implant 01/27/21 with 242mdevice without complication. Medication plan was for monotherapy with Eliquis 2.20m50mID until 6 months. Based on her clinical status and 45 day TEE we will discuss an antiplatelet strategy moving forward.  07/27/21 6 months Eliquis 2.5   1. Paroxysmal atrial fibrillation: S/p Watchman implant with 67m20mevice>>plan for 45 day TEE  -EKG -Eliquis 2.20mg 59m until 07/2021    2. HTN: -Stable with no changes    3. CAD: -No anginal symptoms    Past Medical History:  Diagnosis Date   (HFpEF) heart failure with preserved ejection fraction (HCC) [facility]. 08/2019 Echo: EF 60-65%, no rwma, mild LVH, Gr2 DD, nl RV fxn. Nl pASP. Mildly dil LA. Mild to mod MR.   Anemia    Anxiety    Arthritis    BRCA gene mutation negative 10/2016   NEGATIVE: [facility]   Breast cancer (HCC) [place]21/2018   T1c,N0;, GRADE I/III, 1.6cm. ER/PR pos  HER2 not over expressed, Right Upper Outer   Celiac disease    Depression    Dyspnea    WITH EXERTION   GERD (gastroesophageal reflux disease)    OCC   Glaucoma    right eye   Headache    H/O MIGRAINES AS TEENAGER   Heart murmur    a. 08/2019 Echo: mild to mod MR.   Hypertension    Hypothyroidism    Non-obstructive CAD (coronary artery disease)    a. 01/2017 MV: Hypertensive response. No ischemia/infarct. EF >65%; b. 09/2019 Cor CTA: Ca2+ = 9.29 (36th percentile). LAD calcified plaque (0-24%), otw nl. Multipel bilat pulm nodules up to 7mm. 43mAF (  paroxysmal atrial fibrillation) ([place])    a. 02/2020 Zio: predominantly RSR, 65 (50-105), rare PACs/PVCs, 8 beats NSVT, multiple episodes of PAF lasting up to 1hr 82mns. Avg AF rate 130 (93-170). AF burden <1%. Triggered events = RSR, PACs, and PAF; b. CHA2DS2VASc = 6.   Pneumonia    YEARS AGO   Pulmonary nodules    a. 09/2019 Cor CTA: incidental finding of multiple bilat pulm nodules; b. 12/2019 High Res CT: stable, scattered solid pulm nodules.   Raynaud's disease    RLS (restless legs syndrome)    Stroke ([facility]     Past Surgical History:  Procedure Laterality Date   ABDOMINAL HYSTERECTOMY     APPENDECTOMY     AXILLARY LYMPH NODE BIOPSY Right 03/05/2017   Procedure: AXILLARY LYMPH NODE BIOPSY;  Surgeon: BRobert Bellow MD;  Location: [facility] ORS;  Service: General;  Laterality: Right;   CATARACT EXTRACTION Bilateral     CESAREAN SECTION     COLONOSCOPY WITH PROPOFOL N/A 05/19/2015   Procedure: COLONOSCOPY WITH PROPOFOL;  Surgeon: RManya Silvas MD;  [facility];  Service: Endoscopy;  Laterality: N/A;   COLONOSCOPY WITH PROPOFOL N/A 08/01/2019   Procedure: COLONOSCOPY WITH PROPOFOL;  Surgeon: WLucilla Lame MD;  [facility];  Service: Endoscopy;  Laterality: N/A;   EYE SURGERY     HAND SURGERY Left    LEFT ATRIAL APPENDAGE OCCLUSION N/A 01/27/2021   Procedure: LEFT ATRIAL APPENDAGE OCCLUSION;  Surgeon: LVickie Epley MD;  [facility];  Service: Cardiovascular;  Laterality: N/A;   MASTECTOMY Right 11/07/2016   RESIDUAL INVASIVE MAMMARY CARCINOMA, SUBAREOLAR ANTERIOR TO PREVIOUS    SENTINEL NODE BIOPSY Right 11/07/2016   Procedure: SENTINEL NODE BIOPSY;  Surgeon: BRobert Bellow MD;  Location: [facility] ORS;  Service: General;  Laterality: Right;   SIMPLE MASTECTOMY WITH AXILLARY SENTINEL NODE BIOPSY Right 11/07/2016   6 mm ER/PR 100%; Her 2 neu not overexpressed, T1b, N0.  Surgeon: BRobert Bellow MD;  Location: [facility] ORS;  Service: General;  Laterality: Right;   TEE WITHOUT CARDIOVERSION N/A 01/27/2021   Procedure: TRANSESOPHAGEAL ECHOCARDIOGRAM (TEE);  Surgeon: LVickie Epley MD;  [facility];  Service: Cardiovascular;  Laterality: N/A;   TONSILLECTOMY  AGE 12   UPPER GI ENDOSCOPY  01/12/06   hiatus hernia    Current Medications: No outpatient medications have been marked as taking for the 03/03/21 encounter (Appointment) with TEileen Stanford PA-C.     Allergies:   Patient has no known allergies.   Social History   Socioeconomic History   Marital status: Married    Spouse name: HNadara Mustard  Number of children: 2   Years of education: Not on file   Highest education level: Some college, no degree  Occupational History   Occupation: retired  Tobacco Use   Smoking status: Never   Smokeless tobacco: Never  Vaping Use   Vaping  Use: Never used  Substance and Sexual Activity   Alcohol use: Not Currently   Drug use: No   Sexual activity: Not Currently    Birth control/protection: None  Other Topics Concern   Not on file  Social History Narrative   Lives with Husband   Right handed   Drinks 2-3 cups caffiene daily   Social Determinants of Health   Financial Resource Strain: Low Risk    Difficulty of Paying Living Expenses: Not hard at all  Food Insecurity: No Food Insecurity   Worried About Running  Out of Food in the Last Year: Never true   Ran Out of Food in the Last Year: Never true  Transportation Needs: No Transportation Needs   Lack of Transportation (Medical): No   Lack of Transportation (Non-Medical): No  Physical Activity: Inactive   Days of Exercise per Week: 0 days   Minutes of Exercise per Session: 0 min  Stress: No Stress Concern Present   Feeling of Stress : Only a little  Social Connections: Engineer, building services of Communication with Friends and Family: More than three times a week   Frequency of Social Gatherings with Friends and Family: More than three times a week   Attends Religious Services: More than 4 times per year   Active Member of [employer] or Organizations: Yes   Attends Music therapist: More than 4 times per year   Marital Status: Married     Family History: The patient's ***family history includes Alcohol abuse in her father; Anemia in her mother; Breast cancer in her sister; Breast cancer (age of onset: 25) in her maternal grandmother; CAD in her paternal grandfather; Cancer in her mother and sister; Heart attack in her maternal grandfather; Other in her mother; Prostate cancer in her father; Stroke in her maternal grandmother.  ROS:   Please see the history of present illness.    All other systems reviewed and are negative.  EKGs/Labs/Other Studies Reviewed:    The following studies were reviewed today:    Procedures This Admission:   Transeptal Puncture Intra-procedural TEE which showed no LAA thrombus Left atrial appendage occlusive device placement on 01/27/21 by Dr. [name].  This study demonstrated  IMPRESSIONS   1. Left ventricular ejection fraction, by estimation, is 60 to 65%. The  left ventricle has normal function. The left ventricle has no regional  wall motion abnormalities.   2. Right ventricular systolic function is normal. The right ventricular  size is normal.   3. Well-seated Watchman FLX 24 mm device, without peri-device leak. There  is a tiny mobile filamentous structure, maximum length 3 mm, diameter< 0.5  mm attached to the tip of the "coumadin ridge" that was seen throughout  the procedure, unchanged at the  end of the procedure. There is a small iatrogenic ASD with exclusively  left-to-right shunt after transseptal puncture. No left atrial/left atrial  appendage thrombus was detected.   4. The mitral valve is normal in structure. Mild mitral valve  regurgitation. No evidence of mitral stenosis.   5. The aortic valve is tricuspid. Aortic valve regurgitation is trivial.  Mild aortic valve sclerosis is present, with no evidence of aortic valve  stenosis.   6. The inferior vena cava is normal in size with greater than 50%  respiratory variability, suggesting right atrial pressure of 3 mmHg.       EKG:  EKG is *** ordered today.  The ekg ordered today demonstrates ***  Recent Labs: 07/22/2020: TSH 5.570 10/20/2020: ALT 31; B Natriuretic Peptide 243.0; Magnesium 2.3 12/30/2020: Hemoglobin 11.7; Platelets 252 01/28/2021: BUN 13; Creatinine, Ser 0.80; Potassium 3.6; Sodium 137  Recent Lipid Panel    Component Value Date/Time   CHOL 137 07/17/2020 0436   CHOL 133 04/28/2020 1039   TRIG 95 07/17/2020 0436   HDL 50 07/17/2020 0436   HDL 50 04/28/2020 1039   CHOLHDL 2.7 07/17/2020 0436   VLDL 19 07/17/2020 0436   LDLCALC 68 07/17/2020 0436   LDLCALC 67 04/28/2020 1039     Risk  Assessment/Calculations:   {  Does this patient have ATRIAL FIBRILLATION?[phone]}   Physical Exam:    VS:  There were no vitals taken for this visit.    Wt Readings from Last 3 Encounters:  01/27/21 139 lb (63 kg)  01/19/21 140 lb (63.5 kg)  12/30/20 138 lb 3.2 oz (62.7 kg)     GEN: *** Well nourished, well developed in no acute distress HEENT: Normal NECK: No JVD; No carotid bruits LYMPHATICS: No lymphadenopathy CARDIAC: ***RRR, no murmurs, rubs, gallops RESPIRATORY:  Clear to auscultation without rales, wheezing or rhonchi  ABDOMEN: Soft, non-tender, non-distended MUSCULOSKELETAL:  No edema; No deformity  SKIN: Warm and dry NEUROLOGIC:  Alert and oriented x 3 PSYCHIATRIC:  Normal affect   ASSESSMENT:    No diagnosis found. PLAN:    In order of problems listed above:       {Are you ordering a CV Procedure (e.g. stress test, cath, DCCV, TEE, etc)?   Press F2        :[id number]}    Medication Adjustments/Labs and Tests Ordered: Current medicines are reviewed at length with the patient today.  Concerns regarding medicines are outlined above.  No orders of the defined types were placed in this encounter.  No orders of the defined types were placed in this encounter.   There are no Patient Instructions on file for this visit.   Signed, Kathyrn Drown, NP  03/03/2021 11:32 AM    [facility]

## 2021-03-03 NOTE — Patient Instructions (Signed)
Medication Instructions:  Your physician has recommended you make the following change in your medication:  INCREASE FLECAINIDE 100 MG TWICE DAILY. *If you need a refill on your cardiac medications before your next appointment, please call your pharmacy*   Lab Work: TODAY: BMET, CBC If you have labs (blood work) drawn today and your tests are completely normal, you will receive your results only by: [place] (if you have MyChart) OR A paper copy in the mail If you have any lab test that is abnormal or we need to change your treatment, we will call you to review the results.   Testing/Procedures: Your physician has recommended that you have a Cardioversion (DCCV). Electrical Cardioversion uses a jolt of electricity to your heart either through paddles or wired patches attached to your chest. This is a controlled, usually prescheduled, procedure. Defibrillation is done under light anesthesia in the hospital, and you usually go home the day of the procedure. This is done to get your heart back into a normal rhythm. You are not awake for the procedure. Please see the instruction sheet given to you today.  Follow-Up: At [facility], you and your health needs are our priority.  As part of our continuing mission to provide you with exceptional heart care, we have created designated Provider Care Teams.  These Care Teams include your primary Cardiologist (physician) and Advanced Practice Providers (APPs -  Physician Assistants and Nurse Practitioners) who all work together to provide you with the care you need, when you need it.  We recommend signing up for the patient portal called "MyChart".  Sign up information is provided on this After Visit Summary.  MyChart is used to connect with patients for Virtual Visits (Telemedicine).  Patients are able to view lab/test results, encounter notes, upcoming appointments, etc.  Non-urgent messages can be sent to your provider as well.   To learn more  about what you can do with MyChart, go to [email or web address].    Your next appointment:   March 30, 2021 at 1:40 pm  The format for your next appointment:   In Person  Provider:   Lars Mage, MD   Other Instructions  You are scheduled for a TEE Cardioversion on March 10, 2021 with Dr. Margaretann Loveless.  Please arrive at the [facility] (Main Entrance A) at [facility]: [address] [place], [place] [address] at 10 am.   DIET: Nothing to eat or drink after midnight except a sip of water with medications (see medication instructions below)  FYI: For your safety, and to allow [place] to monitor your vital signs accurately during the surgery/procedure we request that   if you have artificial nails, gel coating, SNS etc. Please have those removed prior to your surgery/procedure. Not having the nail coverings /polish removed may result in cancellation or delay of your surgery/procedure.   Medication Instructions:   Continue your anticoagulant: ELIQUIS You will need to continue your anticoagulant after your procedure until you  are told by your  Provider that it is safe to stop   You must have a responsible person to drive you home and stay in the waiting area during your procedure. Failure to do so could result in cancellation.  Bring your insurance cards.  *Special Note: Every effort is made to have your procedure done on time. Occasionally there are emergencies that occur at the hospital that may cause delays. Please be patient if a delay does occur.

## 2021-03-03 NOTE — H&P (View-Only) (Signed)
[place]                                     Cardiology Office Note:    Date:  03/03/2021   ID:  Sandra [name], DOB 1946/02/14, MRN [id number]  PCP:  Jerrol Banana., MD  [facility] Cardiologist:  Nelva Bush, MD  [facility] Electrophysiologist:  Vickie Epley, MD   Referring MD: Jerrol Banana.,*  Chief Complaint  Patient presents with   Follow-up    1 month post Watchman    History of Present Illness:    Sandra [name] is a 75 y.o. female with a hx of paroxysmal atrial fibrillation, prior intracranial hemorrhage, HTN, and CAD followed by Dr. [name].    She was initially referred to Dr. [name] from Dr. [name] for possible Watchman implant. She had an intracranial hemorrhage while taking Eliquis after suffering a mechanical fall. She presented after several days of symptoms. Upon arrival her anticoagulation was reversed with Kcentra and her BP was aggressively controlled. Repeat CT brain imaging showed stable hemorrhage. She was taken off her anticoagulant and remained on aspirin 81 mg by mouth daily. After recovery, she was referred to Dr. [name] given her high risk of bleeding on anticoagulation. CHA2DS2VASc score calculated at least 5.     She was seen by Dr. Lurline Hare 08/25/20 at which time she was felt to be a good Watchman candidate. Plan was to touch base with neurology and see her back in the office. If stable plan was to pursue Watchman. She was then seen by the AF clinic 12/30/20 and was set up for Watchman implant 01/27/21. Her ASA was stopped and she was started on Eliquis 2.37m BID. She tolerated this well with no bleeding.    She underwent Watchman implant 01/27/21 with 243mdevice without complication. Medication plan was for monotherapy with Eliquis 2.47m57mID until 6 months. Based on her clinical status and 45 day TEE results. Eliquis to stop 07/27/21.   Today she presents with her husband. She  reports that he was recently diagnosed with end stage COPD which has placed more stress on her. She has since noticed that her AF has become more frequent since that time. She has a BP/HR log which she keeps daily. Her PAF was previously very infrequent and would last for short episodes however has no been lasting for hours to a full day at a time. She reports she is very symptomatic with SOB, mild dizziness, and fatigue. She has no HF symptoms. On a recent episode, she called Dr. EndDarnelle Bosfice for assistance was was told to take an additional dose of her beta blocker which did not help her palpitations. Her BP and HR's run borderline low at baseline therefore she was reluctant to continue this. She is in NSR today. We discussed other options for her including increasing her Flecainide. Plan for now will be to try increasing this and follow her TEE. I would like her to be see by Dr. LamQuentin Orer follow up soon after the TEE. [place], LE edema, orthopnea, presyncope or syncopal symptoms.      Past Medical History:  Diagnosis Date   (HFpEF) heart failure with preserved ejection fraction ([place]  a. 08/2019 Echo: EF 60-65%, no rwma, mild LVH,  Gr2 DD, nl RV fxn. Nl pASP. Mildly dil LA. Mild to mod MR.   Anemia    Anxiety    Arthritis    BRCA gene mutation negative 10/2016   NEGATIVE: [employer]   Breast cancer ([place]) 10/16/2016   T1c,N0;, GRADE I/III, 1.6cm. ER/PR pos  HER2 not over expressed, Right Upper Outer   Celiac disease    Depression    Dyspnea    WITH EXERTION   GERD (gastroesophageal reflux disease)    OCC   Glaucoma    right eye   Headache    H/O MIGRAINES AS TEENAGER   Heart murmur    a. 08/2019 Echo: mild to mod MR.   Hypertension    Hypothyroidism    Non-obstructive CAD (coronary artery disease)    a. 01/2017 MV: Hypertensive response. No ischemia/infarct. EF >65%; b. 09/2019 Cor CTA: Ca2+ = 9.29 (36th percentile). LAD calcified  plaque (0-24%), otw nl. Multipel bilat pulm nodules up to 61m.   PAF (paroxysmal atrial fibrillation) ([place]    a. 02/2020 Zio: predominantly RSR, 65 (50-105), rare PACs/PVCs, 8 beats NSVT, multiple episodes of PAF lasting up to 1hr 250ms. Avg AF rate 130 (93-170). AF burden <1%. Triggered events = RSR, PACs, and PAF; b. CHA2DS2VASc = 6.   Pneumonia    YEARS AGO   Pulmonary nodules    a. 09/2019 Cor CTA: incidental finding of multiple bilat pulm nodules; b. 12/2019 High Res CT: stable, scattered solid pulm nodules.   Raynaud's disease    RLS (restless legs syndrome)    [facility]    Past Surgical History:  Procedure Laterality Date   ABDOMINAL HYSTERECTOMY     APPENDECTOMY     AXILLARY LYMPH NODE BIOPSY Right 03/05/2017   Procedure: AXILLARY LYMPH NODE BIOPSY;  Surgeon: ByRobert BellowMD;  Location: [facility] ORS;  Service: General;  Laterality: Right;   CATARACT EXTRACTION Bilateral    CESAREAN SECTION     COLONOSCOPY WITH PROPOFOL N/A 05/19/2015   Procedure: COLONOSCOPY WITH PROPOFOL;  Surgeon: RoManya SilvasMD;  [facility];  Service: Endoscopy;  Laterality: N/A;   COLONOSCOPY WITH PROPOFOL N/A 08/01/2019   Procedure: COLONOSCOPY WITH PROPOFOL;  Surgeon: WoLucilla LameMD;  [facility];  Service: Endoscopy;  Laterality: N/A;   EYE SURGERY     HAND SURGERY Left    LEFT ATRIAL APPENDAGE OCCLUSION N/A 01/27/2021   Procedure: LEFT ATRIAL APPENDAGE OCCLUSION;  Surgeon: LaVickie EpleyMD;  [facility];  Service: Cardiovascular;  Laterality: N/A;   MASTECTOMY Right 11/07/2016   RESIDUAL INVASIVE MAMMARY CARCINOMA, SUBAREOLAR ANTERIOR TO PREVIOUS    SENTINEL NODE BIOPSY Right 11/07/2016   Procedure: SENTINEL NODE BIOPSY;  Surgeon: ByRobert BellowMD;  Location: [facility] ORS;  Service: General;  Laterality: Right;   SIMPLE MASTECTOMY WITH AXILLARY SENTINEL NODE BIOPSY Right 11/07/2016   6 mm ER/PR 100%; Her 2 neu not overexpressed, T1b, N0.   Surgeon: ByRobert BellowMD;  Location: [facility] ORS;  Service: General;  Laterality: Right;   TEE WITHOUT CARDIOVERSION N/A 01/27/2021   Procedure: TRANSESOPHAGEAL ECHOCARDIOGRAM (TEE);  Surgeon: LaVickie EpleyMD;  [place];  Service: Cardiovascular;  Laterality: N/A;   TONSILLECTOMY  AGE 70   UPPER GI ENDOSCOPY  01/12/06   hiatus hernia    Current Medications: Current Meds  Medication Sig   ALPRAZolam (XANAX) 0.5 MG tablet TAKE 1 TABLET BY MOUTH AT  BEDTIME   amLODipine (NORVASC)  10 MG tablet Take 5 mg by mouth daily.   apixaban (ELIQUIS) 5 MG TABS tablet Take 2.5 mg by mouth 2 (two) times daily.   atorvastatin (LIPITOR) 40 MG tablet TAKE 1 TABLET BY MOUTH  DAILY   bisacodyl 5 MG EC tablet Take 5 mg by mouth daily as needed for moderate constipation.   Calcium Carb-Cholecalciferol (CALCIUM 600 + D PO) Take 1 tablet by mouth daily.   Cholecalciferol (VITAMIN D3) 125 MCG (5000 UT) TABS Take 5,000 Units by mouth daily.   docusate sodium (COLACE) 100 MG capsule Take 100 mg by mouth daily as needed for mild constipation.   hydroxypropyl methylcellulose / hypromellose (ISOPTO TEARS / GONIOVISC) 2.5 % ophthalmic solution Place 1 drop into both eyes 4 (four) times daily as needed for dry eyes.   latanoprost (XALATAN) 0.005 % ophthalmic solution Place 1 drop into both eyes at bedtime.   letrozole (FEMARA) 2.5 MG tablet Take 1 tablet (2.5 mg total) by mouth daily.   levothyroxine (SYNTHROID) 100 MCG tablet TAKE 1 TABLET BY MOUTH 30  MINUTES BEFORE BREAKFAST (Patient taking differently: TAKE 1 TABLET BY MOUTH 30  MINUTES BEFORE BREAKFAST)   metoprolol succinate (TOPROL XL) 25 MG 24 hr tablet Take 0.5 tablets (12.5 mg total) by mouth daily. At bedtime   Multiple Vitamin (MULTIVITAMIN WITH MINERALS) TABS tablet Take 1 tablet by mouth daily at 3 pm.   omeprazole (PRILOSEC) 40 MG capsule TAKE 1 CAPSULE BY MOUTH  DAILY   polyethylene glycol (MIRALAX / GLYCOLAX) 17 g packet Take 17  g by mouth daily as needed for moderate constipation.   sertraline (ZOLOFT) 100 MG tablet TAKE 1 TABLET BY MOUTH  DAILY   timolol (BETIMOL) 0.5 % ophthalmic solution Place 1 drop into both eyes 2 (two) times daily.   [DISCONTINUED] flecainide (TAMBOCOR) 50 MG tablet Take 1 tablet (50 mg total) by mouth 2 (two) times daily.     Allergies:   Patient has no known allergies.   Social History   Socioeconomic History   Marital status: Married    Spouse name: Nadara Mustard   Number of children: 2   Years of education: Not on file   Highest education level: Some college, no degree  Occupational History   Occupation: retired  Tobacco Use   Smoking status: Never   Smokeless tobacco: Never  Vaping Use   Vaping Use: Never used  Substance and Sexual Activity   Alcohol use: Not Currently   Drug use: No   Sexual activity: Not Currently    Birth control/protection: None  Other Topics Concern   Not on file  Social History Narrative   Lives with Husband   Right handed   Drinks 2-3 cups caffiene daily   Social Determinants of Health   Financial Resource Strain: Low Risk    Difficulty of Paying Living Expenses: Not hard at all  Food Insecurity: No Food Insecurity   Worried About Charity fundraiser in the Last Year: Never true   [place] in the Last Year: Never true  Transportation Needs: No Transportation Needs   Lack of Transportation (Medical): No   Lack of Transportation (Non-Medical): No  Physical Activity: Inactive   Days of Exercise per Week: 0 days   Minutes of Exercise per Session: 0 min  Stress: No Stress Concern Present   Feeling of Stress : Only a little  Social Connections: Engineer, building services of Communication with Friends and Family: More than three times a  week   Frequency of Social Gatherings with Friends and Family: More than three times a week   Attends Religious Services: More than 4 times per year   Active Member of [employer] or Organizations: Yes    Attends Music therapist: More than 4 times per year   Marital Status: Married     Family History: The patient's family history includes Alcohol abuse in her father; Anemia in her mother; Breast cancer in her sister; Breast cancer (age of onset: 2) in her maternal grandmother; CAD in her paternal grandfather; Cancer in her mother and sister; Heart attack in her maternal grandfather; Other in her mother; Prostate cancer in her father; Stroke in her maternal grandmother.  ROS:   Please see the history of present illness.    All other systems reviewed and are negative.  EKGs/Labs/Other Studies Reviewed:    The following studies were reviewed today:  Echocardiogram TEE 01/27/21:   1. Left ventricular ejection fraction, by estimation, is 60 to 65%. The  left ventricle has normal function. The left ventricle has no regional  wall motion abnormalities.   2. Right ventricular systolic function is normal. The right ventricular  size is normal.   3. Well-seated Watchman FLX 24 mm device, without peri-device leak. There  is a tiny mobile filamentous structure, maximum length 3 mm, diameter< 0.5  mm attached to the tip of the "coumadin ridge" that was seen throughout  the procedure, unchanged at the  end of the procedure. There is a small iatrogenic ASD with exclusively  left-to-right shunt after transseptal puncture. No left atrial/left atrial  appendage thrombus was detected.   4. The mitral valve is normal in structure. Mild mitral valve  regurgitation. No evidence of mitral stenosis.   5. The aortic valve is tricuspid. Aortic valve regurgitation is trivial.  Mild aortic valve sclerosis is present, with no evidence of aortic valve  stenosis.   6. The inferior vena cava is normal in size with greater than 50%  respiratory variability, suggesting right atrial pressure of 3 mmHg.  ----------------------------------------------------------------  LAAO Watchman  01/27/21:  CONCLUSIONS:  1.Successful implantation of a WATCHMAN left atrial appendage occlusive device    2. TEE demonstrating no LAA thrombus 3. No early apparent complications.  4. Continue apixaban 2.9m PO BID monotherapy until 6 months after device implant.   EKG:  EKG is ordered today.  The ekg ordered today demonstrates sinus bradycardia with HR 58bpm, non specific TW abnormalities which appear to be old and present on prior tracings.   Recent Labs: 07/22/2020: TSH 5.570 10/20/2020: ALT 31; B Natriuretic Peptide 243.0; Magnesium 2.3 12/30/2020: Hemoglobin 11.7; Platelets 252 01/28/2021: BUN 13; Creatinine, Ser 0.80; Potassium 3.6; Sodium 137  Recent Lipid Panel    Component Value Date/Time   CHOL 137 07/17/2020 0436   CHOL 133 04/28/2020 1039   TRIG 95 07/17/2020 0436   HDL 50 07/17/2020 0436   HDL 50 04/28/2020 1039   CHOLHDL 2.7 07/17/2020 0436   VLDL 19 07/17/2020 0436   LDLCALC 68 07/17/2020 0436   LDLCALC 67 04/28/2020 1039   Physical Exam:    VS:  BP 112/68   Pulse (!) 58   Ht 5' 3"  (1.6 m)   Wt 137 lb 3.2 oz (62.2 kg)   SpO2 98%   BMI 24.30 kg/m     Wt Readings from Last 3 Encounters:  03/03/21 137 lb 3.2 oz (62.2 kg)  01/27/21 139 lb (63 kg)  01/19/21 140 lb (63.5 kg)  General: Well developed, well nourished, NAD Neck: Negative for carotid bruits. No JVD Lungs:Clear to ausculation bilaterally. Breathing is unlabored. Cardiovascular: RRR with S1 S2. No murmurs Extremities: No edema. Neuro: Alert and oriented. No focal deficits. No facial asymmetry. MAE spontaneously. Psych: Responds to questions appropriately with normal affect.    ASSESSMENT/PLAN:     1. Paroxysmal atrial fibrillation: -s/p Watchman implant with 27m device 01/27/21. Tolerating low dose Eliqius 2.515mBID with no s/s of bleeding in stool or urine. Obtain CBC, BMET today  -Reports increased frequency of symptomatic atrial fibrillation due to increased stress as above. Symptoms include  SOB, fatigue, and mild dizziness. Recently instructed to take an additional dose of her beta blocker (which did not help her palpitations) although BP and HR's running borderline low at baseline. EKG with NSR today. Plan to increase Flecainide from 5019mID to 100m12mD. Will continue with plan for TEE on 03/10/21 however I would like her to be seen in follow up [facility] to discuss AF control including further medication titration, ablation therapy.  -The risks and benefits of transesophageal echocardiogram have been explained including risks of esophageal damage, perforation (1:10,000 risk), bleeding, pharyngeal hematoma as well as other potential complications associated with conscious sedation including aspiration, arrhythmia, respiratory failure and death. Alternatives to treatment were discussed, questions were answered. Patient is willing to proceed.  -EKG with sinus bradycardia, HR 58bpm  -Post Watchman medication plan: Eliquis 2.5mg 53m until 07/2021   2. HTN: -Stable, 112/68 with similar BP log readings -No change in beta blocker as above     Medication Adjustments/Labs and Tests Ordered: Current medicines are reviewed at length with the patient today.  Concerns regarding medicines are outlined above.  No orders of the defined types were placed in this encounter.  No orders of the defined types were placed in this encounter.    Signed, Donovin Kraemer Kathyrn Drown 03/03/2021 2:11 PM    [facility]

## 2021-03-03 NOTE — Progress Notes (Signed)
[place]                                     Cardiology Office Note:    Date:  03/03/2021   ID:  [name], DOB 24-Nov-1945, MRN [id number]  PCP:  Jerrol Banana., MD  [facility] Cardiologist:  Nelva Bush, MD  [facility] Electrophysiologist:  Vickie Epley, MD   Referring MD: Jerrol Banana.,*  Chief Complaint  Patient presents with   Follow-up    1 month post Watchman    History of Present Illness:    Sandra [name] is a 75 y.o. female with a hx of paroxysmal atrial fibrillation, prior intracranial hemorrhage, HTN, and CAD followed by Dr. [name].    She was initially referred to Dr. [name] from Dr. [name] for possible Watchman implant. She had an intracranial hemorrhage while taking Eliquis after suffering a mechanical fall. She presented after several days of symptoms. Upon arrival her anticoagulation was reversed with Kcentra and her BP was aggressively controlled. Repeat CT brain imaging showed stable hemorrhage. She was taken off her anticoagulant and remained on aspirin 81 mg by mouth daily. After recovery, she was referred to Dr. [name] given her high risk of bleeding on anticoagulation. CHA2DS2VASc score calculated at least 5.     She was seen by Dr. Lurline Hare 08/25/20 at which time she was felt to be a good Watchman candidate. Plan was to touch base with neurology and see her back in the office. If stable plan was to pursue Watchman. She was then seen by the AF clinic 12/30/20 and was set up for Watchman implant 01/27/21. Her ASA was stopped and she was started on Eliquis 2.36m BID. She tolerated this well with no bleeding.    She underwent Watchman implant 01/27/21 with 268mdevice without complication. Medication plan was for monotherapy with Eliquis 2.68m36mID until 6 months. Based on her clinical status and 45 day TEE results. Eliquis to stop 07/27/21.   Today she presents with her husband. She  reports that he was recently diagnosed with end stage COPD which has placed more stress on her. She has since noticed that her AF has become more frequent since that time. She has a BP/HR log which she keeps daily. Her PAF was previously very infrequent and would last for short episodes however has no been lasting for hours to a full day at a time. She reports she is very symptomatic with SOB, mild dizziness, and fatigue. She has no HF symptoms. On a recent episode, she called Dr. EndDarnelle Bosfice for assistance was was told to take an additional dose of her beta blocker which did not help her palpitations. Her BP and HR's run borderline low at baseline therefore she was reluctant to continue this. She is in NSR today. We discussed other options for her including increasing her Flecainide. Plan for now will be to try increasing this and follow her TEE. I would like her to be see by Dr. LamQuentin Orer follow up soon after the TEE. [place] pain, LE edema, orthopnea, presyncope or syncopal symptoms.      Past Medical History:  Diagnosis Date   (HFpEF) heart failure with preserved ejection fraction ([facility]  a. 08/2019 Echo: EF 60-65%, no rwma, mild LVH,  Gr2 DD, nl RV fxn. Nl pASP. Mildly dil LA. Mild to mod MR.   Anemia    Anxiety    Arthritis    BRCA gene mutation negative 10/2016   NEGATIVE: [employer]   Breast cancer ([place]) 10/16/2016   T1c,N0;, GRADE I/III, 1.6cm. ER/PR pos  HER2 not over expressed, Right Upper Outer   Celiac disease    Depression    Dyspnea    WITH EXERTION   GERD (gastroesophageal reflux disease)    OCC   Glaucoma    right eye   Headache    H/O MIGRAINES AS TEENAGER   Heart murmur    a. 08/2019 Echo: mild to mod MR.   Hypertension    Hypothyroidism    Non-obstructive CAD (coronary artery disease)    a. 01/2017 MV: Hypertensive response. No ischemia/infarct. EF >65%; b. 09/2019 Cor CTA: Ca2+ = 9.29 (36th percentile). LAD calcified  plaque (0-24%), otw nl. Multipel bilat pulm nodules up to 54m.   PAF ([place]oxysmal [place] fibrillation) [place]    a. 02/2020 Zio: predominantly RSR, 65 (50-105), rare PACs/PVCs, 8 beats NSVT, multiple episodes of PAF lasting up to 1hr 235ms. Avg AF rate 130 (93-170). AF burden <1%. Triggered events = RSR, PACs, and PAF; b. CHA2DS2VASc = 6.   Pneumonia    YEARS AGO   Pulmonary nodules    a. 09/2019 Cor CTA: incidental finding of multiple bilat pulm nodules; b. 12/2019 High Res CT: stable, scattered solid pulm nodules.   Raynaud's disease    RLS (restless legs syndrome)    [facility]    Past Surgical History:  Procedure Laterality Date   ABDOMINAL HYSTERECTOMY     APPENDECTOMY     AXILLARY LYMPH NODE BIOPSY Right 03/05/2017   Procedure: AXILLARY LYMPH NODE BIOPSY;  Surgeon: ByRobert BellowMD;  Location: [facility] ORS;  Service: General;  Laterality: Right;   CATARACT EXTRACTION Bilateral    CESAREAN SECTION     COLONOSCOPY WITH PROPOFOL N/A 05/19/2015   Procedure: COLONOSCOPY WITH PROPOFOL;  Surgeon: RoManya SilvasMD;  [facility];  Service: Endoscopy;  Laterality: N/A;   COLONOSCOPY WITH PROPOFOL N/A 08/01/2019   Procedure: COLONOSCOPY WITH PROPOFOL;  Surgeon: WoLucilla LameMD;  [facility];  Service: Endoscopy;  Laterality: N/A;   EYE SURGERY     HAND SURGERY Left    LEFT ATRIAL APPENDAGE OCCLUSION N/A 01/27/2021   Procedure: LEFT ATRIAL APPENDAGE OCCLUSION;  Surgeon: LaVickie EpleyMD;  [facility];  Service: Cardiovascular;  Laterality: N/A;   MASTECTOMY Right 11/07/2016   RESIDUAL INVASIVE MAMMARY CARCINOMA, SUBAREOLAR ANTERIOR TO PREVIOUS    SENTINEL NODE BIOPSY Right 11/07/2016   Procedure: SENTINEL NODE BIOPSY;  Surgeon: ByRobert BellowMD;  Location: [facility] ORS;  Service: General;  Laterality: Right;   SIMPLE MASTECTOMY WITH AXILLARY SENTINEL NODE BIOPSY Right 11/07/2016   6 mm ER/PR 100%; Her 2 neu not overexpressed, T1b, N0.   Surgeon: ByRobert BellowMD;  Location: [facility] ORS;  Service: General;  Laterality: Right;   TEE WITHOUT CARDIOVERSION N/A 01/27/2021   Procedure: TRANSESOPHAGEAL ECHOCARDIOGRAM (TEE);  Surgeon: LaVickie EpleyMD;  [facility];  Service: Cardiovascular;  Laterality: N/A;   TONSILLECTOMY  AGE 58   UPPER GI ENDOSCOPY  01/12/06   hiatus hernia    Current Medications: Current Meds  Medication Sig   ALPRAZolam (XANAX) 0.5 MG tablet TAKE 1 TABLET BY MOUTH AT  BEDTIME   amLODipine (NORVASC)  10 MG tablet Take 5 mg by mouth daily.   apixaban (ELIQUIS) 5 MG TABS tablet Take 2.5 mg by mouth 2 (two) times daily.   atorvastatin (LIPITOR) 40 MG tablet TAKE 1 TABLET BY MOUTH  DAILY   bisacodyl 5 MG EC tablet Take 5 mg by mouth daily as needed for moderate constipation.   Calcium Carb-Cholecalciferol (CALCIUM 600 + D PO) Take 1 tablet by mouth daily.   Cholecalciferol (VITAMIN D3) 125 MCG (5000 UT) TABS Take 5,000 Units by mouth daily.   docusate sodium (COLACE) 100 MG capsule Take 100 mg by mouth daily as needed for mild constipation.   hydroxypropyl methylcellulose / hypromellose (ISOPTO TEARS / GONIOVISC) 2.5 % ophthalmic solution Place 1 drop into both eyes 4 (four) times daily as needed for dry eyes.   latanoprost (XALATAN) 0.005 % ophthalmic solution Place 1 drop into both eyes at bedtime.   letrozole (FEMARA) 2.5 MG tablet Take 1 tablet (2.5 mg total) by mouth daily.   levothyroxine (SYNTHROID) 100 MCG tablet TAKE 1 TABLET BY MOUTH 30  MINUTES BEFORE BREAKFAST (Patient taking differently: TAKE 1 TABLET BY MOUTH 30  MINUTES BEFORE BREAKFAST)   metoprolol succinate (TOPROL XL) 25 MG 24 hr tablet Take 0.5 tablets (12.5 mg total) by mouth daily. At bedtime   Multiple Vitamin (MULTIVITAMIN WITH MINERALS) TABS tablet Take 1 tablet by mouth daily at 3 pm.   omeprazole (PRILOSEC) 40 MG capsule TAKE 1 CAPSULE BY MOUTH  DAILY   polyethylene glycol (MIRALAX / GLYCOLAX) 17 g packet Take 17  g by mouth daily as needed for moderate constipation.   sertraline (ZOLOFT) 100 MG tablet TAKE 1 TABLET BY MOUTH  DAILY   timolol (BETIMOL) 0.5 % ophthalmic solution Place 1 drop into both eyes 2 (two) times daily.   [DISCONTINUED] flecainide (TAMBOCOR) 50 MG tablet Take 1 tablet (50 mg total) by mouth 2 (two) times daily.     Allergies:   Patient has no known allergies.   Social History   Socioeconomic History   Marital status: Married    Spouse name: Nadara Mustard   Number of children: 2   Years of education: Not on file   Highest education level: Some college, no degree  Occupational History   Occupation: retired  Tobacco Use   Smoking status: Never   Smokeless tobacco: Never  Vaping Use   Vaping Use: Never used  Substance and Sexual Activity   Alcohol use: Not Currently   Drug use: No   Sexual activity: Not Currently    Birth control/protection: None  Other Topics Concern   Not on file  Social History Narrative   Lives with Husband   Right handed   Drinks 2-3 cups caffiene daily   Social Determinants of Health   Financial Resource Strain: Low Risk    Difficulty of Paying Living Expenses: Not hard at all  Food Insecurity: No Food Insecurity   Worried About Charity fundraiser in the Last Year: Never true   [place] in the Last Year: Never true  Transportation Needs: No Transportation Needs   Lack of Transportation (Medical): No   Lack of Transportation (Non-Medical): No  Physical Activity: Inactive   Days of Exercise per Week: 0 days   Minutes of Exercise per Session: 0 min  Stress: No Stress Concern Present   Feeling of Stress : Only a little  Social Connections: Engineer, building services of Communication with Friends and Family: More than three times a  week   Frequency of Social Gatherings with Friends and Family: More than three times a week   Attends Religious Services: More than 4 times per year   Active Member of [employer] or Organizations: Yes    Attends Music therapist: More than 4 times per year   Marital Status: Married     Family History: The patient's family history includes Alcohol abuse in her father; Anemia in her mother; Breast cancer in her sister; Breast cancer (age of onset: 35) in her maternal grandmother; CAD in her paternal grandfather; Cancer in her mother and sister; Heart attack in her maternal grandfather; Other in her mother; Prostate cancer in her father; Stroke in her maternal grandmother.  ROS:   Please see the history of present illness.    All other systems reviewed and are negative.  EKGs/Labs/Other Studies Reviewed:    The following studies were reviewed today:  Echocardiogram TEE 01/27/21:   1. Left ventricular ejection fraction, by estimation, is 60 to 65%. The  left ventricle has normal function. The left ventricle has no regional  wall motion abnormalities.   2. Right ventricular systolic function is normal. The right ventricular  size is normal.   3. Well-seated Watchman FLX 24 mm device, without peri-device leak. There  is a tiny mobile filamentous structure, maximum length 3 mm, diameter< 0.5  mm attached to the tip of the "coumadin ridge" that was seen throughout  the procedure, unchanged at the  end of the procedure. There is a small iatrogenic ASD with exclusively  left-to-right shunt after transseptal puncture. No left atrial/left atrial  appendage thrombus was detected.   4. The mitral valve is normal in structure. Mild mitral valve  regurgitation. No evidence of mitral stenosis.   5. The aortic valve is tricuspid. Aortic valve regurgitation is trivial.  Mild aortic valve sclerosis is present, with no evidence of aortic valve  stenosis.   6. The inferior vena cava is normal in size with greater than 50%  respiratory variability, suggesting right atrial pressure of 3 mmHg.  ----------------------------------------------------------------  LAAO Watchman  01/27/21:  CONCLUSIONS:  1.Successful implantation of a WATCHMAN left atrial appendage occlusive device    2. TEE demonstrating no LAA thrombus 3. No early apparent complications.  4. Continue apixaban 2.2m PO BID monotherapy until 6 months after device implant.   EKG:  EKG is ordered today.  The ekg ordered today demonstrates sinus bradycardia with HR 58bpm, non specific TW abnormalities which appear to be old and present on prior tracings.   Recent Labs: 07/22/2020: TSH 5.570 10/20/2020: ALT 31; B Natriuretic Peptide 243.0; Magnesium 2.3 12/30/2020: Hemoglobin 11.7; Platelets 252 01/28/2021: BUN 13; Creatinine, Ser 0.80; Potassium 3.6; Sodium 137  Recent Lipid Panel    Component Value Date/Time   CHOL 137 07/17/2020 0436   CHOL 133 04/28/2020 1039   TRIG 95 07/17/2020 0436   HDL 50 07/17/2020 0436   HDL 50 04/28/2020 1039   CHOLHDL 2.7 07/17/2020 0436   VLDL 19 07/17/2020 0436   LDLCALC 68 07/17/2020 0436   LDLCALC 67 04/28/2020 1039   Physical Exam:    VS:  BP 112/68   Pulse (!) 58   Ht 5' 3"  (1.6 m)   Wt 137 lb 3.2 oz (62.2 kg)   SpO2 98%   BMI 24.30 kg/m     Wt Readings from Last 3 Encounters:  03/03/21 137 lb 3.2 oz (62.2 kg)  01/27/21 139 lb (63 kg)  01/19/21 140 lb (63.5 kg)  General: Well developed, well nourished, NAD Neck: Negative for carotid bruits. No JVD Lungs:Clear to ausculation bilaterally. Breathing is unlabored. Cardiovascular: RRR with S1 S2. No murmurs Extremities: No edema. Neuro: Alert and oriented. No focal deficits. No facial asymmetry. MAE spontaneously. Psych: Responds to questions appropriately with normal affect.    ASSESSMENT/PLAN:     1. Paroxysmal atrial fibrillation: -s/p Watchman implant with 33m device 01/27/21. Tolerating low dose Eliqius 2.569mBID with no s/s of bleeding in stool or urine. Obtain CBC, BMET today  -Reports increased frequency of symptomatic atrial fibrillation due to increased stress as above. Symptoms include  SOB, fatigue, and mild dizziness. Recently instructed to take an additional dose of her beta blocker (which did not help her palpitations) although BP and HR's running borderline low at baseline. EKG with NSR today. Plan to increase Flecainide from 5063mID to 100m43mD. Will continue with plan for TEE on 03/10/21 however I would like her to be seen in follow up [facility] to discuss AF control including further medication titration, ablation therapy.  -The risks and benefits of transesophageal echocardiogram have been explained including risks of esophageal damage, perforation (1:10,000 risk), bleeding, pharyngeal hematoma as well as other potential complications associated with conscious sedation including aspiration, arrhythmia, respiratory failure and death. Alternatives to treatment were discussed, questions were answered. Patient is willing to proceed.  -EKG with sinus bradycardia, HR 58bpm  -Post Watchman medication plan: Eliquis 2.5mg 17m until 07/2021   2. HTN: -Stable, 112/68 with similar BP log readings -No change in beta blocker as above     Medication Adjustments/Labs and Tests Ordered: Current medicines are reviewed at length with the patient today.  Concerns regarding medicines are outlined above.  No orders of the defined types were placed in this encounter.  No orders of the defined types were placed in this encounter.    Signed, Dat Derksen Kathyrn Drown 03/03/2021 2:11 PM    [facility]

## 2021-03-04 LAB — BASIC METABOLIC PANEL
BUN/Creatinine Ratio: 19 (ref 12–28)
BUN: 14 mg/dL (ref 8–27)
CO2: 25 mmol/L (ref 20–29)
Calcium: 8.9 mg/dL (ref 8.7–10.3)
Chloride: 103 mmol/L (ref 96–106)
Creatinine, Ser: 0.72 mg/dL (ref 0.57–1.00)
Glucose: 91 mg/dL (ref 70–99)
Potassium: 4 mmol/L (ref 3.5–5.2)
Sodium: 141 mmol/L (ref 134–144)
eGFR: 87 mL/min/{1.73_m2} (ref >59)

## 2021-03-04 LAB — CBC
Hematocrit: 35.2 % (ref 34.0–46.6)
Hemoglobin: 11.2 g/dL (ref 11.1–15.9)
MCH: 28 pg (ref 26.6–33.0)
MCHC: 31.8 g/dL (ref 31.5–35.7)
MCV: 88 fL (ref 79–97)
Platelets: 281 10*3/uL (ref 150–450)
RBC: 4 x10E6/uL (ref 3.77–5.28)
RDW: 12.7 % (ref 11.7–15.4)
WBC: 6.5 10*3/uL (ref 3.4–10.8)

## 2021-03-07 ENCOUNTER — Telehealth: Payer: Self-pay | Admitting: Cardiology

## 2021-03-07 NOTE — Telephone Encounter (Signed)
Spoke with pt and notes feeling like she is going to pass out along with the other symptoms( (lightheadedness and fluttering ) prior to the increase of Flecainide to 100 mg bid  [place] the [place] and one of her family members came up along side of her and held arm stated pt was stumbling Pt's B/P  ranging 112-143/58-68 and HR ranging 53-94 Encouraged pt to give a few more days to see if sees improvement with these symptoms Will forward to Kathyrn Drown NP for review and recommendations ./cy

## 2021-03-07 NOTE — Telephone Encounter (Signed)
Pt c/o medication issue:  1. Name of Medication: flecainide (TAMBOCOR) 100 MG tablet  2. How are you currently taking this medication (dosage and times per day)? INCREASED ON 03/03/21 Take 1 tablet (100 mg total) by mouth 2 (two) times daily.  3. Are you having a reaction (difficulty breathing--STAT)? NO  4. What is your medication issue? SINCE THIS MEDICINE WAS INCREASED ON 03/03/21, PT HAS BEEN FEELING LIGHTHEADED, EARS RINGING, STUMBLING

## 2021-03-08 MED ORDER — FLECAINIDE ACETATE 50 MG PO TABS: 50.0000 mg | ORAL_TABLET | Freq: Two times a day (BID) | ORAL | 3 refills | Status: DC

## 2021-03-10 ENCOUNTER — Ambulatory Visit (HOSPITAL_BASED_OUTPATIENT_CLINIC_OR_DEPARTMENT_OTHER)
Admission: RE | Admit: 2021-03-10 | Discharge: 2021-03-10 | Disposition: A | Discharge status: Home / Self Care | Payer: Medicare Other | Source: Ambulatory Visit | Attending: Cardiology | Admitting: Cardiology

## 2021-03-10 ENCOUNTER — Encounter (HOSPITAL_COMMUNITY): Payer: Self-pay | Admitting: Internal Medicine

## 2021-03-10 ENCOUNTER — Ambulatory Visit (HOSPITAL_COMMUNITY): Payer: Medicare Other | Admitting: Certified Registered"

## 2021-03-10 ENCOUNTER — Ambulatory Visit (HOSPITAL_COMMUNITY)
Admission: RE | Admit: 2021-03-10 | Discharge: 2021-03-10 | Disposition: A | Discharge status: Home / Self Care | Payer: Medicare Other | Attending: Internal Medicine | Admitting: Internal Medicine

## 2021-03-10 ENCOUNTER — Encounter (HOSPITAL_COMMUNITY)
Admission: RE | Disposition: A | Discharge status: Home / Self Care | Payer: Self-pay | Source: Home / Self Care | Attending: Internal Medicine

## 2021-03-10 ENCOUNTER — Other Ambulatory Visit: Payer: Self-pay

## 2021-03-10 DIAGNOSIS — Z7901 Long term (current) use of anticoagulants: Secondary | ICD-10-CM | POA: Insufficient documentation

## 2021-03-10 DIAGNOSIS — I2510 Atherosclerotic heart disease of native coronary artery without angina pectoris: Secondary | ICD-10-CM | POA: Insufficient documentation

## 2021-03-10 DIAGNOSIS — Z79899 Other long term (current) drug therapy: Secondary | ICD-10-CM | POA: Diagnosis not present

## 2021-03-10 DIAGNOSIS — Z79890 Hormone replacement therapy (postmenopausal): Secondary | ICD-10-CM | POA: Diagnosis not present

## 2021-03-10 DIAGNOSIS — I082 Rheumatic disorders of both aortic and tricuspid valves: Secondary | ICD-10-CM | POA: Diagnosis not present

## 2021-03-10 DIAGNOSIS — I10 Essential (primary) hypertension: Secondary | ICD-10-CM | POA: Insufficient documentation

## 2021-03-10 DIAGNOSIS — E559 Vitamin D deficiency, unspecified: Secondary | ICD-10-CM | POA: Diagnosis not present

## 2021-03-10 DIAGNOSIS — Q211 Atrial septal defect, unspecified: Secondary | ICD-10-CM | POA: Insufficient documentation

## 2021-03-10 DIAGNOSIS — Z95818 Presence of other cardiac implants and grafts: Secondary | ICD-10-CM

## 2021-03-10 DIAGNOSIS — E785 Hyperlipidemia, unspecified: Secondary | ICD-10-CM | POA: Insufficient documentation

## 2021-03-10 DIAGNOSIS — E039 Hypothyroidism, unspecified: Secondary | ICD-10-CM | POA: Diagnosis not present

## 2021-03-10 DIAGNOSIS — Z7982 Long term (current) use of aspirin: Secondary | ICD-10-CM | POA: Diagnosis not present

## 2021-03-10 DIAGNOSIS — I480 Paroxysmal atrial fibrillation: Secondary | ICD-10-CM | POA: Insufficient documentation

## 2021-03-10 HISTORY — PX: TEE WITHOUT CARDIOVERSION: SHX5443

## 2021-03-10 SURGERY — ECHOCARDIOGRAM, TRANSESOPHAGEAL
Anesthesia: Monitor Anesthesia Care

## 2021-03-10 MED ORDER — PROPOFOL 10 MG/ML IV BOLUS
INTRAVENOUS | Status: DC | PRN
  Administered 2021-03-10: 20 mg via INTRAVENOUS

## 2021-03-10 MED ORDER — SODIUM CHLORIDE 0.9 % IV SOLN: INTRAVENOUS | Status: DC | PRN

## 2021-03-10 MED ORDER — PROPOFOL 500 MG/50ML IV EMUL
INTRAVENOUS | Status: DC | PRN
  Administered 2021-03-10: 100 ug/kg/min via INTRAVENOUS

## 2021-03-10 MED ORDER — BUTAMBEN-TETRACAINE-BENZOCAINE 2-2-14 % EX AERO
INHALATION_SPRAY | CUTANEOUS | Status: DC | PRN
  Administered 2021-03-10: 2 via TOPICAL

## 2021-03-10 NOTE — Anesthesia Procedure Notes (Signed)
Procedure Name: MAC Date/Time: 03/10/2021 11:44 AM Performed by: Jenne Campus, CRNA Pre-anesthesia Checklist: Patient identified, Emergency Drugs available, Suction available and Patient being monitored Oxygen Delivery Method: Nasal cannula

## 2021-03-10 NOTE — Anesthesia Preprocedure Evaluation (Addendum)
Anesthesia Evaluation  Patient identified by MRN, date of birth, ID band Patient awake    Reviewed: Allergy & Precautions, NPO status , Patient's Chart, lab work & pertinent test results  History of Anesthesia Complications Negative for: history of anesthetic complications  Airway Mallampati: II  TM Distance: >3 FB Neck ROM: Full    Dental  (+) Teeth Intact, Dental Advisory Given   Pulmonary    breath sounds clear to auscultation       Cardiovascular hypertension, + CAD   Rhythm:Regular  1. Left ventricular ejection fraction, by estimation, is 60 to 65%. The  left ventricle has normal function. The left ventricle has no regional  wall motion abnormalities.  2. Right ventricular systolic function is normal. The right ventricular  size is normal.  3. Well-seated Watchman FLX 24 mm device, without peri-device leak. There  is a tiny mobile filamentous structure, maximum length 3 mm, diameter< 0.5  mm attached to the tip of the "coumadin ridge" that was seen throughout  the procedure, unchanged at the  end of the procedure. There is a small iatrogenic ASD with exclusively  left-to-right shunt after transseptal puncture. No left atrial/left atrial  appendage thrombus was detected.  4. The mitral valve is normal in structure. Mild mitral valve  regurgitation. No evidence of mitral stenosis.  5. The aortic valve is tricuspid. Aortic valve regurgitation is trivial.  Mild aortic valve sclerosis is present, with no evidence of aortic valve  stenosis.  6. The inferior vena cava is normal in size with greater than 50%  respiratory variability, suggesting right atrial pressure of 3 mmHg.    Neuro/Psych  Headaches, PSYCHIATRIC DISORDERS Anxiety Depression CVA    GI/Hepatic Neg liver ROS, GERD  ,  Endo/Other  Hypothyroidism   Renal/GU      Musculoskeletal   Abdominal   Peds  Hematology  (+) anemia ,   Anesthesia Other  Findings   Reproductive/Obstetrics                                                             Anesthesia Evaluation  Patient identified by MRN, date of birth, ID band Patient awake    Reviewed: Allergy & Precautions, H&P , NPO status , Patient's Chart, lab work & pertinent test results  Airway Mallampati: II   Neck ROM: full    Dental   Pulmonary shortness of breath,    breath sounds clear to auscultation       Cardiovascular hypertension, + CAD  + dysrhythmias Atrial Fibrillation  Rhythm:irregular Rate:Normal     Neuro/Psych  Headaches, PSYCHIATRIC DISORDERS Anxiety Depression CVA    GI/Hepatic GERD  ,  Endo/Other  Hypothyroidism   Renal/GU      Musculoskeletal  (+) Arthritis ,   Abdominal   Peds  Hematology   Anesthesia Other Findings   Reproductive/Obstetrics                             Anesthesia Physical Anesthesia Plan  ASA: 3  Anesthesia Plan: General   Post-op Pain Management:    Induction: Intravenous  PONV Risk Score and Plan: 3 and Ondansetron, Dexamethasone and Treatment may vary due to age or medical condition  Airway Management Planned: Oral ETT  Additional  Equipment:   Intra-op Plan:   Post-operative Plan: Extubation in OR  Informed Consent: I have reviewed the patients History and Physical, chart, labs and discussed the procedure including the risks, benefits and alternatives for the proposed anesthesia with the patient or authorized representative who has indicated his/her understanding and acceptance.     Dental advisory given  Plan Discussed with: CRNA, Anesthesiologist and Surgeon  Anesthesia Plan Comments:         Anesthesia Quick Evaluation  Anesthesia Physical Anesthesia Plan  ASA: 3  Anesthesia Plan: MAC   Post-op Pain Management:    Induction: Intravenous  PONV Risk Score and Plan: 2 and Propofol infusion and Treatment may vary due to age  or medical condition  Airway Management Planned: Nasal Cannula  Additional Equipment: None  Intra-op Plan:   Post-operative Plan:   Informed Consent: I have reviewed the patients History and Physical, chart, labs and discussed the procedure including the risks, benefits and alternatives for the proposed anesthesia with the patient or authorized representative who has indicated his/her understanding and acceptance.     Dental advisory given  Plan Discussed with: CRNA and Anesthesiologist  Anesthesia Plan Comments:         Anesthesia Quick Evaluation

## 2021-03-10 NOTE — CV Procedure (Signed)
INDICATIONS: 45 day post Watchman implant review  PROCEDURE:   Informed consent was obtained prior to the procedure. The risks, benefits and alternatives for the procedure were discussed and the patient comprehended these risks.  Risks include, but are not limited to, cough, sore throat, vomiting, nausea, somnolence, esophageal and stomach trauma or perforation, bleeding, low blood pressure, aspiration, pneumonia, infection, trauma to the teeth and death.    After a procedural time-out, the oropharynx was anesthetized with 20% benzocaine spray.   During this procedure the patient was administered propofol per anesthesia.  The patient's heart rate, blood pressure, and oxygen saturation were monitored continuously during the procedure. The period of conscious sedation was 30 minutes, of which I was present face-to-face 100% of this time.  The transesophageal probe was inserted in the esophagus and stomach without difficulty and multiple views were obtained.  The patient was kept under observation until the patient left the procedure room.  The patient left the procedure room in stable condition.   Agitated microbubble saline contrast was not administered.  COMPLICATIONS:    There were no immediate complications.  FINDINGS:   FORMAL ECHOCARDIOGRAM REPORT PENDING Normal appearing 24 mm Watchman FLX LAAO device.  Filamentous structure on the coumadin ridge is no longer clearly sean.   RECOMMENDATIONS:    Discharge home when alert.   Time Spent Directly with the Patient:  45 minutes   Elouise Munroe 03/10/2021, 11:58 AM

## 2021-03-10 NOTE — Transfer of Care (Signed)
Immediate Anesthesia Transfer of Care Note  Patient: Sandra [name]  Procedure(s) Performed: TRANSESOPHAGEAL ECHOCARDIOGRAM (TEE)  Patient Location: PACU  Anesthesia Type:MAC  Level of Consciousness: awake and oriented  Airway & Oxygen Therapy: Patient Spontanous Breathing  Post-op Assessment: Report given to RN  Post vital signs: Reviewed and stable  Last Vitals:  Vitals Value Taken Time  BP 127/70 03/10/21 1201  Temp    Pulse 58 03/10/21 1201  Resp 19 03/10/21 1201  SpO2 95 % 03/10/21 1201  Vitals shown include unvalidated device data.  Last Pain:  Vitals:   03/10/21 1034  TempSrc: Temporal  PainSc: 0-No pain         Complications: No notable events documented.

## 2021-03-10 NOTE — Interval H&P Note (Signed)
History and Physical Interval Note:  03/10/2021 11:21 AM  Sandra [name]  has presented today for surgery, with the diagnosis of POST WATCHMAN IMPLANT.  The various methods of treatment have been discussed with the patient and family. After consideration of risks, benefits and other options for treatment, the patient has consented to  Procedure(s): TRANSESOPHAGEAL ECHOCARDIOGRAM (TEE) (N/A) as a surgical intervention.  The patient's history has been reviewed, patient examined, no change in status, stable for surgery.  I have reviewed the patient's chart and labs.  Questions were answered to the patient's satisfaction.     Elouise Munroe

## 2021-03-10 NOTE — Progress Notes (Signed)
Echocardiogram Echocardiogram Transesophageal has been performed.  Oneal Deputy Donald Memoli RDCS 03/10/2021, 12:17 PM

## 2021-03-11 ENCOUNTER — Telehealth: Payer: Self-pay | Admitting: Cardiology

## 2021-03-11 NOTE — Telephone Encounter (Signed)
It appears that HR and BP were normal yesterday around time of TEE.  I recommend that the patient see Dr. [name] or the a-fib clinic as soon as possible for further evaluation and management of her atrial fibrillation.  Nelva Bush, MD [facility]

## 2021-03-11 NOTE — Telephone Encounter (Signed)
Patient daughter calling Would like to speak with nurse and clarify before scheduling Please call to discuss

## 2021-03-11 NOTE — Telephone Encounter (Signed)
Spoke with pt's daughter, Sandra [name] and advised of Dr Darnelle Bos recommendation to follow up with Dr [name] or Afib clinic.  Arranged appointment with Afib clinic on 03/15/2021 at 2pm.  She states her mother does not feel well and since she does not live in town it is difficult to determine what is really going on with pt even though she feels she is going in and out of Afib. She knows her mother is also concerned her symptoms of lightheadedness also correlate with her previous stroke.  Advised daughter that phone note has been sent to Dr Mardene Speak nurse for further recommendation from Dr [name].  Pt's daughter verbalizes understanding and thanked Therapist, sports for the phone call.

## 2021-03-11 NOTE — Telephone Encounter (Signed)
Spoke with Dr [name] who recommends pt stop Flecainide and follow up with Afib clinic on 03/15/2021 at 2pm as scheduled.  Pt notified via MyChart message.  Phone call to pt's daughter, [name], [place] and advised of Dr Mardene Speak recommendations.  Reviewed ED precautions.  Pt's daughter verbalizes understanding and agrees with current plan.

## 2021-03-11 NOTE — Telephone Encounter (Signed)
STAT if patient feels like he/she is going to faint   Are you dizzy now? Last time she message daughter she was   Do you feel faint or have you passed out? no  Do you have any other symptoms? Light headiness  Have you checked your HR and BP (record if available)? Daughter does not have reading.

## 2021-03-13 ENCOUNTER — Encounter (HOSPITAL_COMMUNITY): Payer: Self-pay | Admitting: Internal Medicine

## 2021-03-15 ENCOUNTER — Ambulatory Visit (HOSPITAL_COMMUNITY)
Admission: RE | Admit: 2021-03-15 | Discharge: 2021-03-15 | Disposition: A | Discharge status: Home / Self Care | Payer: Medicare Other | Source: Ambulatory Visit | Attending: Physician Assistant | Admitting: Physician Assistant

## 2021-03-15 ENCOUNTER — Other Ambulatory Visit: Payer: Self-pay

## 2021-03-15 ENCOUNTER — Encounter (HOSPITAL_COMMUNITY): Payer: Self-pay | Admitting: Physician Assistant

## 2021-03-15 VITALS — BP 120/60 | HR 57 | Ht 63.0 in | Wt 136.2 lb

## 2021-03-15 DIAGNOSIS — Z79899 Other long term (current) drug therapy: Secondary | ICD-10-CM | POA: Diagnosis not present

## 2021-03-15 DIAGNOSIS — Z7901 Long term (current) use of anticoagulants: Secondary | ICD-10-CM | POA: Insufficient documentation

## 2021-03-15 DIAGNOSIS — Z8249 Family history of ischemic heart disease and other diseases of the circulatory system: Secondary | ICD-10-CM | POA: Diagnosis not present

## 2021-03-15 DIAGNOSIS — D6869 Other thrombophilia: Secondary | ICD-10-CM | POA: Diagnosis not present

## 2021-03-15 DIAGNOSIS — I2510 Atherosclerotic heart disease of native coronary artery without angina pectoris: Secondary | ICD-10-CM | POA: Diagnosis not present

## 2021-03-15 DIAGNOSIS — I10 Essential (primary) hypertension: Secondary | ICD-10-CM | POA: Insufficient documentation

## 2021-03-15 DIAGNOSIS — I480 Paroxysmal atrial fibrillation: Secondary | ICD-10-CM | POA: Diagnosis not present

## 2021-03-15 NOTE — Progress Notes (Signed)
Primary Care Physician: Jerrol Banana., MD Primary Cardiologist: Dr End  Primary Electrophysiologist: Dr [name] Referring Physician: Dr Verlan Sandra [name] is a 75 y.o. female with a history of intracranial hemorrhage, HTN, CAD, and atrial fibrillation who presents for follow up in the [facility]. Patient has a CHADS2VASC score of 5. Patient was seen by Dr [name] for [facility] device consideration. She is not felt to be a candidate for long term anticoagulation due to intracranial hemorrhage while on Eliquis 06/2020. Eliquis was reversed at that time. She was taken off anticoagulation but remained on ASA. She is s/p Watchman implant on 01/27/21.  On follow up today, patient reported on her follow up 03/03/21 that she was having more frequent episodes of afib with symptoms of SOB, dizziness, and fatigue. Her flecainide was increased but her symptoms worsened. Flecainide was discontinued on 03/11/21 and she has not had any symptoms since. She is feeling much better. She states that her husband was recently diagnosed with severe COPD which has caused her significant stress.   Today, she denies symptoms of palpitations, chest pain, shortness of breath, orthopnea, PND, lower extremity edema, dizziness, presyncope, syncope, snoring, daytime somnolence, bleeding, or neurologic sequela. The patient is tolerating medications without difficulties and is otherwise without complaint today.    Atrial Fibrillation Risk Factors:  she does not have symptoms or diagnosis of sleep apnea. she does not have a history of rheumatic fever.   she has a BMI of Body mass index is 24.13 kg/m.[name] Filed Weights   03/15/21 1406  Weight: 61.8 kg     Family History  Problem Relation Age of Onset   Cancer Mother    Anemia Mother    Other Mother        lymphosarcoma   Alcohol abuse Father    Prostate cancer Father    Breast cancer Sister        15; 1/2 sister, shared  mother.   Cancer Sister        bile duct   Stroke Maternal Grandmother    Breast cancer Maternal Grandmother 68   Heart attack Maternal Grandfather    CAD Paternal Grandfather      Atrial Fibrillation Management history:  Previous antiarrhythmic drugs: flecainide  Previous cardioversions: none Previous ablations: none CHADS2VASC score: 5 Anticoagulation history: Eliquis   Past Medical History:  Diagnosis Date   (HFpEF) heart failure with preserved ejection fraction ([place])    a. 08/2019 Echo: EF 60-65%, no rwma, mild LVH, Gr2 DD, nl RV fxn. Nl pASP. Mildly dil LA. Mild to mod MR.   Anemia    Anxiety    Arthritis    BRCA gene mutation negative 10/2016   NEGATIVE: [employer]   Breast cancer ([place]) 10/16/2016   T1c,N0;, GRADE I/III, 1.6cm. ER/PR pos  HER2 not over expressed, Right Upper Outer   Celiac disease    Depression    Dyspnea    WITH EXERTION   GERD (gastroesophageal reflux disease)    OCC   Glaucoma    right eye   Headache    H/O MIGRAINES AS TEENAGER   Heart murmur    a. 08/2019 Echo: mild to mod MR.   Hypertension    Hypothyroidism    Non-obstructive CAD (coronary artery disease)    a. 01/2017 MV: Hypertensive response. No ischemia/infarct. EF >65%; b. 09/2019 Cor CTA: Ca2+ = 9.29 (36th percentile). LAD calcified plaque (0-24%), otw nl. Multipel bilat pulm nodules  up to 42m.   PAF ([place]oxysmal atrial fibrillation) ([place]    a. 02/2020 Zio: predominantly RSR, 65 (50-105), rare PACs/PVCs, 8 beats NSVT, multiple episodes of PAF lasting up to 1hr 246ms. Avg AF rate 130 (93-170). AF burden <1%. Triggered events = RSR, PACs, and PAF; b. CHA2DS2VASc = 6.   Pneumonia    YEARS AGO   Pulmonary nodules    a. 09/2019 Cor CTA: incidental finding of multiple bilat pulm nodules; b. 12/2019 High Res CT: stable, scattered solid pulm nodules.   Raynaud's disease    RLS (restless legs syndrome)    [facility]   Past Surgical History:  Procedure Laterality Date   ABDOMINAL  HYSTERECTOMY     APPENDECTOMY     AXILLARY LYMPH NODE BIOPSY Right 03/05/2017   Procedure: AXILLARY LYMPH NODE BIOPSY;  Surgeon: ByRobert BellowMD;  Location: [facility] ORS;  Service: General;  Laterality: Right;   CATARACT EXTRACTION Bilateral    CESAREAN SECTION     COLONOSCOPY WITH PROPOFOL N/A 05/19/2015   Procedure: COLONOSCOPY WITH PROPOFOL;  Surgeon: RoManya SilvasMD;  [facility];  Service: Endoscopy;  Laterality: N/A;   COLONOSCOPY WITH PROPOFOL N/A 08/01/2019   Procedure: COLONOSCOPY WITH PROPOFOL;  Surgeon: WoLucilla LameMD;  [facility];  Service: Endoscopy;  Laterality: N/A;   EYE SURGERY     HAND SURGERY Left    LEFT ATRIAL APPENDAGE OCCLUSION N/A 01/27/2021   Procedure: LEFT ATRIAL APPENDAGE OCCLUSION;  Surgeon: LaVickie EpleyMD;  [facility];  Service: Cardiovascular;  Laterality: N/A;   MASTECTOMY Right 11/07/2016   RESIDUAL INVASIVE MAMMARY CARCINOMA, SUBAREOLAR ANTERIOR TO PREVIOUS    SENTINEL NODE BIOPSY Right 11/07/2016   Procedure: SENTINEL NODE BIOPSY;  Surgeon: ByRobert BellowMD;  Location: [facility] ORS;  Service: General;  Laterality: Right;   SIMPLE MASTECTOMY WITH AXILLARY SENTINEL NODE BIOPSY Right 11/07/2016   6 mm ER/PR 100%; Her 2 neu not overexpressed, T1b, N0.  Surgeon: ByRobert BellowMD;  Location: [facility] ORS;  Service: General;  Laterality: Right;   TEE WITHOUT CARDIOVERSION N/A 01/27/2021   Procedure: TRANSESOPHAGEAL ECHOCARDIOGRAM (TEE);  Surgeon: LaVickie EpleyMD;  [facility];  Service: Cardiovascular;  Laterality: N/A;   TEE WITHOUT CARDIOVERSION N/A 03/10/2021   Procedure: TRANSESOPHAGEAL ECHOCARDIOGRAM (TEE);  Surgeon: AcElouise MunroeMD;  [facility] Service: Cardiology;  Laterality: N/A;   TONSILLECTOMY  AGE 62   UPPER GI ENDOSCOPY  01/12/06   hiatus hernia    Current Outpatient Medications  Medication Sig Dispense Refill   ALPRAZolam (XANAX) 0.5 MG tablet  TAKE 1 TABLET BY MOUTH AT  BEDTIME 90 tablet 1   amLODipine (NORVASC) 10 MG tablet Take 5 mg by mouth daily.     apixaban (ELIQUIS) 5 MG TABS tablet Take 2.5 mg by mouth 2 (two) times daily.     atorvastatin (LIPITOR) 40 MG tablet TAKE 1 TABLET BY MOUTH  DAILY 90 tablet 1   bisacodyl 5 MG EC tablet Take 5 mg by mouth daily as needed for moderate constipation.     Calcium Carb-Cholecalciferol (CALCIUM 600 + D PO) Take 1 tablet by mouth daily.     Cholecalciferol (VITAMIN D3) 125 MCG (5000 UT) TABS Take 5,000 Units by mouth daily.     docusate sodium (COLACE) 100 MG capsule Take 100 mg by mouth daily as needed for mild constipation.     hydroxypropyl methylcellulose / hypromellose (ISOPTO TEARS /  GONIOVISC) 2.5 % ophthalmic solution Place 1 drop into both eyes 4 (four) times daily as needed for dry eyes.     latanoprost (XALATAN) 0.005 % ophthalmic solution Place 1 drop into both eyes at bedtime.  5   letrozole (FEMARA) 2.5 MG tablet Take 1 tablet (2.5 mg total) by mouth daily. 90 tablet 3   levothyroxine (SYNTHROID) 100 MCG tablet TAKE 1 TABLET BY MOUTH 30  MINUTES BEFORE BREAKFAST (Patient taking differently: TAKE 1 TABLET BY MOUTH 30  MINUTES BEFORE BREAKFAST) 90 tablet 1   metoprolol succinate (TOPROL XL) 25 MG 24 hr tablet Take 0.5 tablets (12.5 mg total) by mouth daily. At bedtime 45 tablet 3   Multiple Vitamin (MULTIVITAMIN WITH MINERALS) TABS tablet Take 1 tablet by mouth daily at 3 pm.     mupirocin nasal ointment (BACTROBAN) 2 % Place 1 application into the nose 2 (two) times daily as needed (staph infection). Use one-half of tube in each nostril twice daily for five (5) days. After application, press sides of nose together and gently massage.     omeprazole (PRILOSEC) 40 MG capsule TAKE 1 CAPSULE BY MOUTH  DAILY 90 capsule 1   polyethylene glycol (MIRALAX / GLYCOLAX) 17 g packet Take 17 g by mouth daily as needed for moderate constipation.     sertraline (ZOLOFT) 100 MG tablet TAKE 1 TABLET  BY MOUTH  DAILY 90 tablet 1   timolol (BETIMOL) 0.5 % ophthalmic solution Place 1 drop into both eyes 2 (two) times daily.     No current facility-administered medications for this encounter.    No Known Allergies  Social History   Socioeconomic History   Marital status: Married    Spouse name: Nadara Mustard   Number of children: 2   Years of education: Not on file   Highest education level: Some college, no degree  Occupational History   Occupation: retired  Tobacco Use   Smoking status: Never   Smokeless tobacco: Never  Vaping Use   Vaping Use: Never used  Substance and Sexual Activity   Alcohol use: Not Currently   Drug use: No   Sexual activity: Not Currently    Birth control/protection: None  Other Topics Concern   Not on file  Social History Narrative   Lives with Husband   Right handed   Drinks 2-3 cups caffiene daily   Social Determinants of Health   Financial Resource Strain: Low Risk    Difficulty of Paying Living Expenses: Not hard at all  Food Insecurity: No Food Insecurity   Worried About Charity fundraiser in the Last Year: Never true   [place] in the Last Year: Never true  Transportation Needs: No Transportation Needs   Lack of Transportation (Medical): No   Lack of Transportation (Non-Medical): No  Physical Activity: Inactive   Days of Exercise per Week: 0 days   Minutes of Exercise per Session: 0 min  Stress: No Stress Concern Present   Feeling of Stress : Only a little  Social Connections: Engineer, building services of Communication with Sandra [name] and Family: More than three times a week   Frequency of Social Gatherings with Sandra [name] and Family: More than three times a week   Attends Religious Services: More than 4 times per year   Active Member of [employer] or Organizations: Yes   Attends Music therapist: More than 4 times per year   Marital Status: Married  Human resources officer Violence: Not At Risk  Fear of Current or  Ex-Partner: No   Emotionally Abused: No   Physically Abused: No   Sexually Abused: No     ROS- All systems are reviewed and negative except as per the HPI above.  Physical Exam: Vitals:   03/15/21 1406  BP: 120/60  Pulse: (!) 57  Weight: 61.8 kg  Height: 5' 3"  (1.6 m)    GEN- The patient is a well appearing elderly female, alert and oriented x 3 today.   HEENT-head normocephalic, atraumatic, sclera clear, conjunctiva pink, hearing intact, trachea midline. Lungs- Clear to ausculation bilaterally, normal work of breathing Heart- Regular rate and rhythm, no murmurs, rubs or gallops  GI- soft, NT, ND, + BS Extremities- no clubbing, cyanosis, or edema MS- no significant deformity or atrophy Skin- no rash or lesion Psych- euthymic mood, full affect Neuro- strength and sensation are intact   Wt Readings from Last 3 Encounters:  03/15/21 61.8 kg  03/10/21 62.6 kg  03/03/21 62.2 kg    EKG today demonstrates  SB, ST-T changes (baseline) Vent. rate 57 BPM PR interval 174 ms QRS duration 88 ms QT/QTcB 440/428 ms  Echo 07/16/20 demonstrated   1. Left ventricular ejection fraction, by estimation, is 65 to 70%. The left ventricle has normal function. The left ventricle has no regional wall motion abnormalities. There is moderate left ventricular hypertrophy. Left ventricular diastolic parameters are consistent with Grade II diastolic dysfunction (pseudonormalization).   2. Right ventricular systolic function is normal. The right ventricular  size is normal.   3. Left atrial size was mildly dilated.   4. Right atrial size was mildly dilated.   5. The mitral valve is normal in structure. Mild mitral valve  regurgitation.   6. The aortic valve is normal in structure. Aortic valve regurgitation is trivial.    Epic records are reviewed at length today   CHA2DS2-VASc Score = 5  The patient's score is based upon: CHF History: 0 HTN History: 1 Diabetes History: 0 Stroke History:  0 Vascular Disease History: 1 Age Score: 2 Gender Score: 1     ASSESSMENT AND PLAN: 1. Paroxysmal Atrial Fibrillation (ICD10:  I48.0) The patient's CHA2DS2-VASc score is 5, indicating a 7.2% annual risk of stroke.   S/p Watchman implant 01/27/21 TEE 03/10/21 showed device well seated with no leak or thrombus, results d/w patient today. Continue Eliquis 2.5 mg BID monotherapy for 6 months post Watchman.  Patient feeling much better of flecainide. We discussed alternate AAD (dofetilide) today. Given how good she is feeling now, she would like to monitor for now. Information about dofetilide information given.  Continue Toprol 12.5 mg daily  2. Secondary Hypercoagulable State (ICD10:  D68.69) The patient is at significant risk for stroke/thromboembolism based upon her CHA2DS2-VASc Score of 5.  Continue Apixaban (Eliquis). See plans above.  3. HTN Stable, no changes today.  4. CAD No anginal symptoms.   Follow up in the AF clinic in 3 months. Dr [name] as scheduled.    [facility] [address] [place], [place] [address] [phone] 03/15/2021 3:32 PM

## 2021-03-15 NOTE — Anesthesia Postprocedure Evaluation (Signed)
Anesthesia Post Note  Patient: Sandra [name]  Procedure(s) Performed: TRANSESOPHAGEAL ECHOCARDIOGRAM (TEE)     Patient location during evaluation: PACU Anesthesia Type: MAC Level of consciousness: awake and alert Pain management: pain level controlled Vital Signs Assessment: post-procedure vital signs reviewed and stable Respiratory status: spontaneous breathing, nonlabored ventilation, respiratory function stable and patient connected to nasal cannula oxygen Cardiovascular status: stable and blood pressure returned to baseline Postop Assessment: no apparent nausea or vomiting Anesthetic complications: no   No notable events documented.  Last Vitals:  Vitals:   03/10/21 1200 03/10/21 1215  BP: 127/70 131/70  Pulse: 60 (!) 58  Resp: 19 15  Temp: 36.5 C 36.5 C  SpO2: 95% 95%    Last Pain:  Vitals:   03/11/21 1406  TempSrc:   PainSc: 0-No pain                 Justus Duerr

## 2021-03-17 NOTE — Telephone Encounter (Signed)
Multiple chart entries see other notes and has been seen in afib clinic as well  ./cy

## 2021-03-30 ENCOUNTER — Ambulatory Visit: Payer: Medicare Other | Admitting: Cardiology

## 2021-04-04 ENCOUNTER — Other Ambulatory Visit: Payer: Self-pay | Admitting: Family Medicine

## 2021-04-04 NOTE — Telephone Encounter (Signed)
last RF 12/27/20 #90 1 RF

## 2021-04-07 ENCOUNTER — Telehealth: Payer: Self-pay | Admitting: Internal Medicine

## 2021-04-07 NOTE — Telephone Encounter (Signed)
A MyChart message was received from the patient the morning as below:  "I have not had any afib incidents in a while now, but I was awakened at 12:30 am this morning with it and was soaking wet with sweat. I am usually cold at night if anything. I took my blood pressure and it was 150/84, pulse 130. I took it two more times a few minutes apart. It went to 120/89, pulse 116, then 135/75, pulse 119.  I got up at 7:25 still feeling it. BP was 110/83, pulse 124. I feel lightheaded, but not dizzy. Oxygen level stayed at 96. What is your advice? Do I need to be seen today?"  Reviewed the patient's chart. She has a history of a-fib and is s/p Watchman on 01/27/21 with Dr. [name].  Per her procedure note, she is to continue eliquis 2.5 mg BID x 6 months post implant. The patient is not felt to be a long term anticoagulation candidate due to an intracranial hemorrhage while on Eliquis in 06/2020.  Flecainide was discontinued on 03/11/21 due to SOB/ dizziness/ fatigue.  Her medication list currently shows that she is taking metoprolol succinate 25 mg- 0.5 tablet (12.5 mg) by mouth once daily.  I called and spoke with the patient. She advised that she took her BP/ HR at 7:45 am- 110/83 (124). She took her metoprolol succ 12.5 mg once daily dose this morning ~ 8:00 am. Upon my calling her, she advised she had gone back in to normal rhythm ~ 30 minutes prior to my calling her.   I advised the patient there is nothing else that we would recommend at this time, however, if she does have a recurrent episode of a-fib, she may take an extra 0.5 tablet (12.5 mg) of metoprolol succinate to try to slow her rates down and hopefully help convert her back to sinus rhythm.  She states her BP will run low at times, which is why she is on the 1/2 tablet once daily. I have advised her if her HR is > 100 bpm & her SBP is > 100, she can take the extra 1/2 tablet of metprolol if needed, but if she converts to sinus and her BP is  running a little lower than normal, she could drink some extra water to help with this.   She states she typically does not go to stores, but had been to 2 stores looking for pants for her husband yesterday, who is disabled, and then to the grocery store- she reports feeling very fatigued when she got home and wonders if this may have triggered her event. I have advised her that stress may trigger a-fib, but to please try to stay active in her walking and exercise and she states she is currently doing.  The patient is aware to call if her symptoms increase in frequency.  She voices understanding and is agreeable.  She was very appreciative of the call back.

## 2021-05-02 ENCOUNTER — Other Ambulatory Visit: Payer: Self-pay | Admitting: Family Medicine

## 2021-05-02 DIAGNOSIS — H401131 Primary open-angle glaucoma, bilateral, mild stage: Secondary | ICD-10-CM | POA: Diagnosis not present

## 2021-05-02 DIAGNOSIS — R1084 Generalized abdominal pain: Secondary | ICD-10-CM

## 2021-05-03 NOTE — Telephone Encounter (Signed)
Requested Prescriptions  Pending Prescriptions Disp Refills  . omeprazole (PRILOSEC) 40 MG capsule [Pharmacy Med Name: Omeprazole 40 MG Oral Capsule Delayed Release] 90 capsule 0    Sig: TAKE 1 CAPSULE BY MOUTH  DAILY     Gastroenterology: Proton Pump Inhibitors Passed - 05/02/2021 10:02 PM      Passed - Valid encounter within last 12 months    Recent Outpatient Visits          3 months ago Hemorrhagic stroke [facility])   [facility] Jerrol Banana., MD   7 months ago Encounter for [employer] annual wellness exam   [facility] Jerrol Banana., MD   8 months ago Cerebellar hemorrhage, acute [facility])   [facility] Jerrol Banana., MD   9 months ago Hemorrhagic stroke [facility])   [facility] Jerrol Banana., MD   10 months ago [place] Jerrol Banana., MD      Future Appointments            In 1 month Jerrol Banana., MD [facility], [place]   In 2 months Vickie Epley, MD [facility], [place]

## 2021-05-09 ENCOUNTER — Telehealth: Payer: Self-pay | Admitting: Family Medicine

## 2021-05-09 NOTE — Telephone Encounter (Signed)
Pt is calling regarding her AWV Pt did not know awv was canceled for 05/10/21. Pt would like to know does this appt need to be rescheduled. Cb- [phone]

## 2021-05-11 ENCOUNTER — Other Ambulatory Visit: Payer: Self-pay | Admitting: Physician Assistant

## 2021-05-11 ENCOUNTER — Telehealth: Payer: Self-pay | Admitting: Cardiology

## 2021-05-11 ENCOUNTER — Telehealth: Payer: Self-pay

## 2021-05-11 DIAGNOSIS — Z95818 Presence of other cardiac implants and grafts: Secondary | ICD-10-CM

## 2021-05-11 DIAGNOSIS — Z792 Long term (current) use of antibiotics: Secondary | ICD-10-CM

## 2021-05-11 MED ORDER — AMOXICILLIN 500 MG PO CAPS: ORAL_CAPSULE | ORAL | 0 refills | Status: DC

## 2021-05-11 NOTE — Telephone Encounter (Signed)
Antibiotic has been sent in, patient was asked to check with her cardiologist about the Eliquis    Copied from [place] [id number]. Topic: General - Other >> May 11, 2021  3:29 PM Sandra [name] wrote:  Pt is needing antibiotic re dental surgery due to Watchman implant in Sept FU with pt [phone] send to  [id number] - Sandra [name], [address] [place] [address] Phone: [phone] Fax: [phone]  Sandra [name] is handling

## 2021-05-11 NOTE — Telephone Encounter (Signed)
° °  Patient Name: Sandra [name]  DOB: 06-Sep-1945 MRN: [id number]  Primary Cardiologist: Nelva Bush, MD  Chart reviewed as part of pre-operative protocol coverage.   Dental cleanings considered low risk procedures per guidelines and generally do not require any specific cardiac clearance. It is also generally accepted that for simple extractions and dental cleanings, there is no need to interrupt blood thinner therapy. We do not generally stop Eliquis for dental cleanings. I did review this with the structural APPs who agree it is fine to proceed with dental cleaning as requested tomorrow. SBE prophylaxis is required for the patient from a cardiac standpoint for 6 months post-Watchman procedure (she had this performed 01/27/21). Her primary care provider team sent this in for amoxicillin 500mg  taking 4 tablets 1 hour prior to cleaning. I spoke with the patient to confirm she is aware of these instructions.  I will route this recommendation to the requesting party via Epic fax function and remove from pre-op pool.  Please call with questions.  Charlie Pitter, PA-C 05/11/2021, 4:43 PM

## 2021-05-11 NOTE — Telephone Encounter (Signed)
° °  Pre-operative Risk Assessment    Patient Name: Sandra [name]  DOB: June 14, 1945 MRN: [id number]     Request for Surgical Clearance    Procedure:  Routine Dental cleaning but new watchmen device 2022  Date of Surgery:  Clearance 05/12/21                                 Surgeon:  [facility] or Practice Name:   Phone number:  [phone] Fax number:  [phone]   Type of Clearance Requested:   - Pharmacy:  Hold Apixaban (Eliquis) please advise if hold needed for routine cleaning    Type of Anesthesia:  None    Additional requests/questions:  Please advise surgeon/provider what medications should be held.  Jonathon Jordan   05/11/2021, 3:55 PM

## 2021-05-18 ENCOUNTER — Ambulatory Visit (INDEPENDENT_AMBULATORY_CARE_PROVIDER_SITE_OTHER): Payer: Medicare Other

## 2021-05-18 DIAGNOSIS — Z0000 Encounter for general adult medical examination without abnormal findings: Secondary | ICD-10-CM

## 2021-05-18 NOTE — Progress Notes (Signed)
Virtual Visit via Telephone Note  I connected with  Sandra [name] on 05/18/21 at  3:40 PM EST by telephone and verified that I am speaking with the correct person using two identifiers.  Location: Patient: home  Provider: [facility] Persons participating in the virtual visit: [place]   I discussed the limitations, risks, security and privacy concerns of performing an evaluation and management service by telephone and the availability of in person appointments. The patient expressed understanding and agreed to proceed.  Interactive audio and video telecommunications were attempted between this nurse and patient, however failed, due to patient having technical difficulties OR patient did not have access to video capability.  We continued and completed visit with audio only.  Some vital signs may be absent or patient reported.   Dionisio David, LPN  Subjective:   Sandra [name] is a 75 y.o. female who presents for Medicare Annual (Subsequent) preventive examination.  Review of Systems           Objective:    There were no vitals filed for this visit. There is no height or weight on file to calculate BMI.  Advanced Directives 03/10/2021 01/27/2021 01/27/2021 10/20/2020 07/28/2020 07/17/2020 07/15/2020  Does Patient Have a Medical Advance Directive? _0  Yes Yes  Type of Paramedic of [place];Living will [place];Living will [place];Living will Living will;Healthcare Power of [place];Living will Healthcare Power of [place]  Does patient want to make changes to medical advance directive? - No - Patient declined - No - Patient declined - No - Patient declined -  Copy of [place] in Chart? - - - No - copy requested - - -  Would patient like information on creating a medical advance directive? - - - No - Patient declined - -  -    Current Medications (verified) Outpatient Encounter Medications as of 05/18/2021  Medication Sig   ALPRAZolam (XANAX) 0.5 MG tablet TAKE 1 TABLET BY MOUTH AT  BEDTIME   amLODipine (NORVASC) 10 MG tablet Take 5 mg by mouth daily.   amoxicillin (AMOXIL) 500 MG capsule 4 tabs 1 hour before procedure   apixaban (ELIQUIS) 5 MG TABS tablet Take 2.5 mg by mouth 2 (two) times daily.   atorvastatin (LIPITOR) 40 MG tablet TAKE 1 TABLET BY MOUTH  DAILY   bisacodyl 5 MG EC tablet Take 5 mg by mouth daily as needed for moderate constipation.   Calcium Carb-Cholecalciferol (CALCIUM 600 + D PO) Take 1 tablet by mouth daily.   Cholecalciferol (VITAMIN D3) 125 MCG (5000 UT) TABS Take 5,000 Units by mouth daily.   docusate sodium (COLACE) 100 MG capsule Take 100 mg by mouth daily as needed for mild constipation.   hydroxypropyl methylcellulose / hypromellose (ISOPTO TEARS / GONIOVISC) 2.5 % ophthalmic solution Place 1 drop into both eyes 4 (four) times daily as needed for dry eyes.   latanoprost (XALATAN) 0.005 % ophthalmic solution Place 1 drop into both eyes at bedtime.   letrozole (FEMARA) 2.5 MG tablet Take 1 tablet (2.5 mg total) by mouth daily.   levothyroxine (SYNTHROID) 100 MCG tablet TAKE 1 TABLET BY MOUTH 30  MINUTES BEFORE BREAKFAST (Patient taking differently: TAKE 1 TABLET BY MOUTH 30  MINUTES BEFORE BREAKFAST)   metoprolol succinate (TOPROL XL) 25 MG 24 hr tablet Take 0.5 tablets (12.5 mg total) by mouth daily. At bedtime   Multiple  Vitamin (MULTIVITAMIN WITH MINERALS) TABS tablet Take 1 tablet by mouth daily at 3 pm.   mupirocin nasal ointment (BACTROBAN) 2 % Place 1 application into the nose 2 (two) times daily as needed (staph infection). Use one-half of tube in each nostril twice daily for five (5) days. After application, press sides of nose together and gently massage.   omeprazole (PRILOSEC) 40 MG capsule TAKE 1 CAPSULE BY MOUTH  DAILY   polyethylene glycol (MIRALAX / GLYCOLAX) 17 g  packet Take 17 g by mouth daily as needed for moderate constipation.   sertraline (ZOLOFT) 100 MG tablet TAKE 1 TABLET BY MOUTH  DAILY   timolol (BETIMOL) 0.5 % ophthalmic solution Place 1 drop into both eyes 2 (two) times daily.   No facility-administered encounter medications on file as of 05/18/2021.    Allergies (verified) Patient has no known allergies.   History: Past Medical History:  Diagnosis Date   (HFpEF) heart failure with preserved ejection fraction ([place])    a. 08/2019 Echo: EF 60-65%, no rwma, mild LVH, Gr2 DD, nl RV fxn. Nl pASP. Mildly dil LA. Mild to mod MR.   Anemia    Anxiety    Arthritis    BRCA gene mutation negative 10/2016   NEGATIVE: [facility]   Breast cancer ([place]) 10/16/2016   T1c,N0;, GRADE I/III, 1.6cm. ER/PR pos  HER2 not over expressed, Right Upper Outer   Celiac disease    Depression    Dyspnea    WITH EXERTION   GERD (gastroesophageal reflux disease)    OCC   Glaucoma    right eye   Headache    H/O MIGRAINES AS TEENAGER   Heart murmur    a. 08/2019 Echo: mild to mod MR.   Hypertension    Hypothyroidism    Non-obstructive CAD (coronary artery disease)    a. 01/2017 MV: Hypertensive response. No ischemia/infarct. EF >65%; b. 09/2019 Cor CTA: Ca2+ = 9.29 (36th percentile). LAD calcified plaque (0-24%), otw nl. Multipel bilat pulm nodules up to 41m.   PAF (paroxysmal atrial fibrillation) ([place]    a. 02/2020 Zio: predominantly RSR, 65 (50-105), rare PACs/PVCs, 8 beats NSVT, multiple episodes of PAF lasting up to 1hr 259ms. Avg AF rate 130 (93-170). AF burden <1%. Triggered events = RSR, PACs, and PAF; b. CHA2DS2VASc = 6.   Pneumonia    YEARS AGO   Pulmonary nodules    a. 09/2019 Cor CTA: incidental finding of multiple bilat pulm nodules; b. 12/2019 High Res CT: stable, scattered solid pulm nodules.   Raynaud's disease    RLS (restless legs syndrome)    [facility]   Past Surgical History:  Procedure Laterality Date   ABDOMINAL HYSTERECTOMY      APPENDECTOMY     AXILLARY LYMPH NODE BIOPSY Right 03/05/2017   Procedure: AXILLARY LYMPH NODE BIOPSY;  Surgeon: ByRobert BellowMD;  Location: [facility] ORS;  Service: General;  Laterality: Right;   CATARACT EXTRACTION Bilateral    CESAREAN SECTION     COLONOSCOPY WITH PROPOFOL N/A 05/19/2015   Procedure: COLONOSCOPY WITH PROPOFOL;  Surgeon: RoManya SilvasMD;  [facility];  Service: Endoscopy;  Laterality: N/A;   COLONOSCOPY WITH PROPOFOL N/A 08/01/2019   Procedure: COLONOSCOPY WITH PROPOFOL;  Surgeon: WoLucilla LameMD;  [facility];  Service: Endoscopy;  Laterality: N/A;   EYE SURGERY     HAND SURGERY Left    LEFT ATRIAL APPENDAGE OCCLUSION N/A 01/27/2021   Procedure: LEFT ATRIAL APPENDAGE OCCLUSION;  Surgeon:  Vickie Epley, MD;  Location: [place] CV LAB;  Service: Cardiovascular;  Laterality: N/A;   MASTECTOMY Right 11/07/2016   RESIDUAL INVASIVE MAMMARY CARCINOMA, SUBAREOLAR ANTERIOR TO PREVIOUS    SENTINEL NODE BIOPSY Right 11/07/2016   Procedure: SENTINEL NODE BIOPSY;  Surgeon: Robert Bellow, MD;  Location: [facility] OR[facility];  Service: General;  Laterality: Right;   SIMPLE MASTECTOMY WITH AXILLARY SENTINEL NODE BIOPSY Right 11/07/2016   6 mm ER/PR 100%; Her 2 neu not overexpressed, T1b, N0.  Surgeon: Robert Bellow, MD;  Location: [facility];  Service: General;  Laterality: Right;   TEE WITHOUT CARDIOVERSION N/A 01/27/2021   Procedure: TRANSESOPHAGEAL ECHOCARDIOGRAM (TEE);  Surgeon: Vickie Epley, MD;  Location: [place] CV LAB;  Service: Cardiovascular;  Laterality: N/A;   TEE WITHOUT CARDIOVERSION N/A 03/10/2021   Procedure: TRANSESOPHAGEAL ECHOCARDIOGRAM (TEE);  Surgeon: Elouise Munroe, MD;  Location: [place];  Service: Cardiology;  Laterality: N/A;   TONSILLECTOMY  AGE 41   UPPER GI ENDOSCOPY  01/12/06   hiatus hernia   Family History  Problem Relation Age of Onset   Cancer Mother    Anemia Mother    Other Mother        lymphosarcoma    Alcohol abuse Father    Prostate cancer Father    Breast cancer Sister        13; 1/2 sister, shared mother.   Cancer Sister        bile duct   Stroke Maternal Grandmother    Breast cancer Maternal Grandmother 20   Heart attack Maternal Grandfather    CAD Paternal Grandfather    Social History   Socioeconomic History   Marital status: Married    Spouse name: Nadara Mustard   Number of children: 2   Years of education: Not on file   Highest education level: Some college, no degree  Occupational History   Occupation: retired  Tobacco Use   Smoking status: Never   Smokeless tobacco: Never  Vaping Use   Vaping Use: Never used  Substance and Sexual Activity   Alcohol use: Not Currently   Drug use: No   Sexual activity: Not Currently    Birth control/protection: None  Other Topics Concern   Not on file  Social History Narrative   Lives with Husband   Right handed   Drinks 2-3 cups caffiene daily   Social Determinants of Health   Financial Resource Strain: Not on file  Food Insecurity: Not on file  Transportation Needs: Not on file  Physical Activity: Not on file  Stress: Not on file  Social Connections: Not on file    Tobacco Counseling Counseling given: Not Answered   Clinical Intake:  Pre-visit preparation completed: Yes  Pain : No/denies pain     Nutritional Risks: None Diabetes: No  How often do you need to have someone help you when you read instructions, pamphlets, or other written materials from your doctor or pharmacy?: 1 - Never  Diabetic?no  Interpreter Needed?: No  Information entered by :: Kirke Shaggy, LPN   Activities of Daily Living In your present state of health, do you have any difficulty performing the following activities: 05/17/2021 01/27/2021  Hearing? [place]? Y -  Difficulty concentrating or making decisions? Y -  Walking or climbing stairs? Y -  Dressing or bathing? N -  Doing errands, shopping? N N  Preparing Food and  eating ? N -  Using the Toilet? N -  In the  past six months, have you accidently leaked urine? Y -  Do you have problems with loss of bowel control? N -  Managing your Medications? N -  Managing your Finances? N -  Housekeeping or managing your Housekeeping? N -  Some recent data might be hidden    Patient Care Team: Jerrol Banana., MD as PCP - General (Family Medicine) End, Harrell Gave, MD as PCP - Cardiology (Cardiology) Vickie Epley, MD as PCP - Electrophysiology (Cardiology) Leandrew Koyanagi, MD as Referring Physician (Ophthalmology) Bary Castilla, Forest Gleason, MD (General Surgery) Wilhelmina Mcardle., MD (Dentistry) Lucilla Lame, MD as Consulting Physician (Gastroenterology)  Indicate any recent Medical Services you may have received from other than [facility] providers in the past year (date may be approximate).     Assessment:   This is a routine wellness examination for Kasiya.  Hearing/Vision screen No results found.  Dietary issues and exercise activities discussed:     Goals Addressed   None   Depression Screen PHQ 2/9 Scores 11/24/2019 04/29/2019 04/22/2018 06/06/2017 04/18/2017 04/18/2017 04/07/2016  PHQ - 2 Score 1 0 0 0 1 1 0  PHQ- 9 Score 8 - - 1 2 - -    Fall Risk Fall Risk  05/17/2021 05/04/2020 04/29/2019 04/22/2018 06/06/2017  Falls in the past year? 1 1 0 0 No  Number falls in past yr: 1 1 0 - -  Injury with Fall? 1 0 0 - -  Comment - - - - -  Risk for fall due to : - Impaired balance/gait - - -  Follow up - Falls prevention discussed - - -    FALL RISK PREVENTION PERTAINING TO THE HOME:  Any stairs in or around the home? Yes  If so, are there any without handrails? No  Home free of loose throw rugs in walkways, pet beds, electrical cords, etc? Yes  Adequate lighting in your home to reduce risk of falls? Yes   ASSISTIVE DEVICES UTILIZED TO PREVENT FALLS:  Life alert? No  Use of a cane, walker or w/c? No  Grab bars in the bathroom? Yes   Shower chair or bench in shower? No  Elevated toilet seat or a handicapped toilet? No    Cognitive Function:Normal cognitive status assessed by direct observation by this Nurse Health Advisor. No abnormalities found.     MMSE - Mini Mental State Exam 10/13/2020 11/24/2019  Orientation to time 5 5  Orientation to Place 5 5  Registration 3 3  Attention/ Calculation 2 5  Recall 2 3  Language- name 2 objects 2 2  Language- repeat 1 1  Language- follow 3 step command 3 3  Language- read & follow direction 1 1  Write a sentence 1 1  Copy design 1 1  Total score 26 30     6CIT Screen 04/07/2016  What Year? 0 points  What month? 0 points  What time? 0 points  Count back from 20 0 points  Months in reverse 0 points  Repeat phrase 0 points  Total Score 0    Immunizations Immunization History  Administered Date(s) Administered   Influenza, High Dose Seasonal PF 03/04/2015, 04/07/2016, 03/16/2017, 02/09/2018, 02/19/2019, 02/20/2020   PFIZER(Purple Top)SARS-COV-2 Vaccination 06/18/2019, 07/12/2019, 01/27/2020   Pneumococcal Conjugate-13 02/05/2014   Pneumococcal Polysaccharide-23 04/07/2016   Tdap 03/18/2008    TDAP status: Due, Education has been provided regarding the importance of this vaccine. Advised may receive this vaccine at local pharmacy or Health Dept. Aware to provide a  copy of the vaccination record if obtained from local pharmacy or Health Dept. Verbalized acceptance and understanding.  Flu Vaccine status: Up to date  Pneumococcal vaccine status: Up to date  Covid-19 vaccine status: Completed vaccines  Qualifies for Shingles Vaccine? Yes   Zostavax completed No   Shingrix Completed?: No.    Education has been provided regarding the importance of this vaccine. Patient has been advised to call insurance company to determine out of pocket expense if they have not yet received this vaccine. Advised may also receive vaccine at local pharmacy or Health Dept. Verbalized  acceptance and understanding.  Screening Tests Health Maintenance  Topic Date Due   Zoster Vaccines- Shingrix (1 of 2) Never done   TETANUS/TDAP  03/18/2018   COVID-19 Vaccine (4 - Booster for Pfizer series) 03/23/2020   INFLUENZA VACCINE  12/27/2020   DEXA SCAN  07/20/2024   COLONOSCOPY (Pts 45-62yr Insurance coverage will need to be confirmed)  07/31/2024   Pneumonia Vaccine 75 Years old  Completed   Hepatitis C Screening  Completed   HPV VACCINES  Aged Out    Health Maintenance  Health Maintenance Due  Topic Date Due   Zoster Vaccines- Shingrix (1 of 2) Never done   TETANUS/TDAP  03/18/2018   COVID-19 Vaccine (4 - Booster for Pfizer series) 03/23/2020   INFLUENZA VACCINE  12/27/2020    Colorectal cancer screening: Type of screening: Colonoscopy. Completed 08/01/19. Repeat every 10 years  Mammogram status: Completed 12/09/20. Repeat every year  Bone Density status: Completed 07/21/19. Results reflect: Bone density results: NORMAL. Repeat every 5 years.  Lung Cancer Screening: (Low Dose CT Chest recommended if Age 76-80years, 30 pack-year currently smoking OR have quit w/in 15years.) does not qualify.    Additional Screening:  Hepatitis C Screening: does qualify; Completed 06/07/17  Vision Screening: Recommended annual ophthalmology exams for early detection of glaucoma and other disorders of the eye. Is the patient up to date with their annual eye exam?  Yes  Who is the provider or what is the name of the [facility] which the patient attends annual [facility] exams[facility] provider, would they like to be referred to a provider to establish care? No .   Dental Screening: Recommended annual dental exams for proper oral hygiene  Community Resource Referral / Chronic Care Management: CRR required this visit?  No   CCM required this visit?  No      Plan:     I have personally reviewed and noted the following in the patients  chart:   Medical and social history Use of alcohol, tobacco or illicit drugs  Current medications and supplements including opioid prescriptions.  Functional ability and status Nutritional status Physical activity Advanced directives List of other physicians Hospitalizations, surgeries, and ER visits in previous 12 months Vitals Screenings to include cognitive, depression, and falls Referrals and appointments  In addition, I have reviewed and discussed with patient certain preventive protocols, quality metrics, and best practice recommendations. A written personalized care plan for preventive services as well as general preventive health recommendations were provided to patient.     LDionisio David LPN   [id number]  Nurse Notes:

## 2021-05-18 NOTE — Patient Instructions (Signed)
Sandra [name] , Thank you for taking time to come for your Medicare Wellness Visit. I appreciate your ongoing commitment to your health goals. Please review the following plan we discussed and let me know if I can assist you in the future.   Screening recommendations/referrals: Colonoscopy: 08/01/19 Mammogram: 12/09/20 Bone Density: 07/21/19 Recommended yearly ophthalmology/optometry visit for glaucoma screening and checkup Recommended yearly dental visit for hygiene and checkup  Vaccinations: Influenza vaccine: 03/04/21 Pneumococcal vaccine: 04/07/16 Tdap vaccine: 03/18/08 Shingles vaccine: n/d   Covid-19:06/18/19, 07/12/19, 01/27/20  Advanced directives: no  Conditions/risks identified: none  Next appointment: Follow up in one year for your annual wellness visit 05/23/22 @ 3pm   Preventive Care 76 Years and Older, Female Preventive care refers to lifestyle choices and visits with your health care provider that can promote health and wellness. What does preventive care include? A yearly physical exam. This is also called an annual well check. Dental exams once or twice a year. Routine eye exams. Ask your health care provider how often you should have your eyes checked. Personal lifestyle choices, including: Daily care of your teeth and gums. Regular physical activity. Eating a healthy diet. Avoiding tobacco and drug use. Limiting alcohol use. Practicing safe sex. Taking low-dose aspirin every day. Taking vitamin and mineral supplements as recommended by your health care provider. What happens during an annual well check? The services and screenings done by your health care provider during your annual well check will depend on your age, overall health, lifestyle risk factors, and family history of disease. Counseling  Your health care provider may ask you questions about your: Alcohol use. Tobacco use. Drug use. Emotional well-being. Home and relationship well-being. Sexual  activity. Eating habits. History of falls. Memory and ability to understand (cognition). Work and work Statistician. Reproductive health. Screening  You may have the following tests or measurements: Height, weight, and BMI. Blood pressure. Lipid and cholesterol levels. These may be checked every 5 years, or more frequently if you are over 35 years old. Skin check. Lung cancer screening. You may have this screening every year starting at age 76 if you have a 30-pack-year history of smoking and currently smoke or have quit within the past 15 years. Fecal occult blood test (FOBT) of the stool. You may have this test every year starting at age 37. Flexible sigmoidoscopy or colonoscopy. You may have a sigmoidoscopy every 5 years or a colonoscopy every 10 years starting at age 47. Hepatitis C blood test. Hepatitis B blood test. Sexually transmitted disease (STD) testing. Diabetes screening. This is done by checking your blood sugar (glucose) after you have not eaten for a while (fasting). You may have this done every 1-3 years. Bone density scan. This is done to screen for osteoporosis. You may have this done starting at age 76. Mammogram. This may be done every 1-2 years. Talk to your health care provider about how often you should have regular mammograms. Talk with your health care provider about your test results, treatment options, and if necessary, the need for more tests. Vaccines  Your health care provider may recommend certain vaccines, such as: Influenza vaccine. This is recommended every year. Tetanus, diphtheria, and acellular pertussis (Tdap, Td) vaccine. You may need a Td booster every 10 years. Zoster vaccine. You may need this after age 76. Pneumococcal 13-valent conjugate (PCV13) vaccine. One dose is recommended after age 76. Pneumococcal polysaccharide (PPSV23) vaccine. One dose is recommended after age 76. Talk to your health care provider about which  screenings and vaccines  you need and how often you need them. This information is not intended to replace advice given to you by your health care provider. Make sure you discuss any questions you have with your health care provider. Document Released: 06/11/2015 Document Revised: 02/02/2016 Document Reviewed: 03/16/2015 Elsevier Interactive Patient Education  2017 [place] Prevention in the Home Falls can cause injuries. They can happen to people of all ages. There are many things you can do to make your home safe and to help prevent falls. What can I do on the outside of my home? Regularly fix the edges of walkways and driveways and fix any cracks. Remove anything that might make you trip as you walk through a door, such as a raised step or threshold. Trim any bushes or trees on the path to your home. Use bright outdoor lighting. Clear any walking paths of anything that might make someone trip, such as rocks or tools. Regularly check to see if handrails are loose or broken. Make sure that both sides of any steps have handrails. Any raised decks and porches should have guardrails on the edges. Have any leaves, snow, or ice cleared regularly. Use sand or salt on walking paths during winter. Clean up any spills in your garage right away. This includes oil or grease spills. What can I do in the bathroom? Use night lights. Install grab bars by the toilet and in the tub and shower. Do not use towel bars as grab bars. Use non-skid mats or decals in the tub or shower. If you need to sit down in the shower, use a plastic, non-slip stool. Keep the floor dry. Clean up any water that spills on the floor as soon as it happens. Remove soap buildup in the tub or shower regularly. Attach bath mats securely with double-sided non-slip rug tape. Do not have throw rugs and other things on the floor that can make you trip. What can I do in the bedroom? Use night lights. Make sure that you have a light by your bed that  is easy to reach. Do not use any sheets or blankets that are too big for your bed. They should not hang down onto the floor. Have a firm chair that has side arms. You can use this for support while you get dressed. Do not have throw rugs and other things on the floor that can make you trip. What can I do in the kitchen? Clean up any spills right away. Avoid walking on wet floors. Keep items that you use a lot in easy-to-reach places. If you need to reach something above you, use a strong step stool that has a grab bar. Keep electrical cords out of the way. Do not use floor polish or wax that makes floors slippery. If you must use wax, use non-skid floor wax. Do not have throw rugs and other things on the floor that can make you trip. What can I do with my stairs? Do not leave any items on the stairs. Make sure that there are handrails on both sides of the stairs and use them. Fix handrails that are broken or loose. Make sure that handrails are as long as the stairways. Check any carpeting to make sure that it is firmly attached to the stairs. Fix any carpet that is loose or worn. Avoid having throw rugs at the top or bottom of the stairs. If you do have throw rugs, attach them to the floor with carpet  tape. Make sure that you have a light switch at the top of the stairs and the bottom of the stairs. If you do not have them, ask someone to add them for you. What else can I do to help prevent falls? Wear shoes that: Do not have high heels. Have rubber bottoms. Are comfortable and fit you well. Are closed at the toe. Do not wear sandals. If you use a stepladder: Make sure that it is fully opened. Do not climb a closed stepladder. Make sure that both sides of the stepladder are locked into place. Ask someone to hold it for you, if possible. Clearly mark and make sure that you can see: Any grab bars or handrails. First and last steps. Where the edge of each step is. Use tools that help you  move around (mobility aids) if they are needed. These include: Canes. Walkers. Scooters. Crutches. Turn on the lights when you go into a dark area. Replace any light bulbs as soon as they burn out. Set up your furniture so you have a clear path. Avoid moving your furniture around. If any of your floors are uneven, fix them. If there are any pets around you, be aware of where they are. Review your medicines with your doctor. Some medicines can make you feel dizzy. This can increase your chance of falling. Ask your doctor what other things that you can do to help prevent falls. This information is not intended to replace advice given to you by your health care provider. Make sure you discuss any questions you have with your health care provider. Document Released: 03/11/2009 Document Revised: 10/21/2015 Document Reviewed: 06/19/2014 Elsevier Interactive Patient Education  2017 [employer].

## 2021-05-26 ENCOUNTER — Other Ambulatory Visit: Payer: Self-pay | Admitting: Family Medicine

## 2021-05-26 NOTE — Telephone Encounter (Signed)
Requested medications are due for refill today.  A bit too soon.  Requested medications are on the active medications list.  yes  Last refill. 12/27/2020 #90 with 1 refill  Future visit scheduled.   yes  Notes to clinic.  Failed protocol d/t abnormal labs.    Requested Prescriptions  Pending Prescriptions Disp Refills   levothyroxine (SYNTHROID) 100 MCG tablet [Pharmacy Med Name: Levothyroxine Sodium 100 MCG Oral Tablet] 90 tablet 3    Sig: TAKE 1 TABLET BY MOUTH 65  MINUTES BEFORE BREAKFAST     Endocrinology:  Hypothyroid Agents Failed - 05/26/2021  4:46 AM      Failed - TSH needs to be rechecked within 3 months after an abnormal result. Refill until TSH is due.      Failed - TSH in normal range and within 360 days    TSH  Date Value Ref Range Status  07/22/2020 5.570 (H) 0.450 - 4.500 uIU/mL Final          Passed - Valid encounter within last 12 months    Recent Outpatient Visits           4 months ago Hemorrhagic stroke [facility])   [facility] Jerrol Banana., MD   7 months ago Encounter for [employer] annual wellness exam   [facility] Jerrol Banana., MD   9 months ago Cerebellar hemorrhage, acute [facility])   [facility] Jerrol Banana., MD   10 months ago Hemorrhagic stroke [facility])   [facility] Jerrol Banana., MD   11 months ago [place] Jerrol Banana., MD       Future Appointments             In 3 weeks Jerrol Banana., MD [facility], [place]   In 2 months Vickie Epley, MD [facility], [place]

## 2021-05-26 NOTE — Telephone Encounter (Signed)
Please review. It looks like refills are not due.

## 2021-05-27 NOTE — Telephone Encounter (Signed)
Last refill was 03/31/2021 for 90 day supply. Too soon for refill according to available info

## 2021-05-30 ENCOUNTER — Other Ambulatory Visit: Payer: Self-pay | Admitting: Internal Medicine

## 2021-05-30 ENCOUNTER — Other Ambulatory Visit: Payer: Self-pay | Admitting: Family Medicine

## 2021-05-30 DIAGNOSIS — R1084 Generalized abdominal pain: Secondary | ICD-10-CM

## 2021-05-31 NOTE — Telephone Encounter (Signed)
Please contact pt for future appointment. Pt overdue for 6 month f/u. Pt needing refills. 

## 2021-06-08 ENCOUNTER — Ambulatory Visit: Payer: Medicare Other | Admitting: Neurology

## 2021-06-08 VITALS — BP 146/70 | HR 66 | Ht 64.0 in | Wt 135.0 lb

## 2021-06-08 DIAGNOSIS — Z95818 Presence of other cardiac implants and grafts: Secondary | ICD-10-CM | POA: Diagnosis not present

## 2021-06-08 DIAGNOSIS — Z8673 Personal history of transient ischemic attack (TIA), and cerebral infarction without residual deficits: Secondary | ICD-10-CM

## 2021-06-08 NOTE — Patient Instructions (Signed)
I had a long discussion the patient and her husband regarding her remote intracerebral hemorrhage, atrial fibrillation and recent watchman procedure and answered questions.  Continue Eliquis and the current dose of 2.5 mg twice daily for now but I wonder if she can come off it and switch to aspirin as its been more than 3 months and I will discuss this with Dr. [name].  Continue strict control of hypertension with blood pressure goal below 140/90.  Check screening carotid ultrasound study.  Return for follow-up in the future only as necessary.

## 2021-06-08 NOTE — Progress Notes (Signed)
[facility] [address] [place]. [place] [address] [phone]       OFFICE FOLLOW UP VISITT NOTE  [name] Date of Birth:  [birth year] Medical Record Number:  [id number]   Referring MD:  Sarita Haver  Reason for Referral:  Brain Hemorrhage  CZY:[id number] visit 3/9 2022; Sandra [name] is a pleasant 20 year Caucasian lady seen todat  for first office consult visit for brain hemorrhage.She is accompanied by her husband and daughter.I have personally reviewed HPI in person, electronic medical records and pertinent imaging films in PACS. Sandra [name] is a 76 y.o. female with PMH significant for anemia, GERD, hx of Breast cancer(2018), depression, celiac disease, CAD, pAfibb on Eliquis, Restless leg syndrome who presents with mild occipital headache and room spinning since 07/11/20 along with difficult with left hand clumsiness and dropping things with her left hand. She did not think too much of the symptoms and thought this was just a regular headache until daughter noticed and brought her to the ED. Endorses nausea, photophobia. Workup with CTH without contrast with a 1cm focal area of hemorrhage in the left cerebellum dentate nucleus. Her Eliquis was reversed with Kcentra and she was started on Clevidipine for blood pressure control. Repeat CTH in 6 hours showed stable hemorrhage. Further  workup with MRI with no underlying lesion, MRA with no anueryem or vascualr malformation. EF of 65-70% with grade 2 diastolic dysfunction and moderate LVH. No shunt. LDL of 68 with HbA1c of 6.3.She states she is doing well and dizziness and balance are improving and she is currently doing outpatient therapies. She fatigues easily and gets tired and gets off balance sometimes.Family has noticed increasing memory and cognitive deficits which are worse than her baseline memory problems she had even prior. She wants to drive but family ahs concerns. She has started aspirin now. She has appt with Dr  end cardiologist this week to discuss possible watchman device. Update 11/24/2020 : She returns for follow-up after last visit 3 months ago.  She is accompanied by her husband as well as I spoke to her daughter over the phone who was present throughout this visit.  Patient states she is doing well.  She is continues to have mild short-term memory difficulties with she is perspiring and regular activities like solving crossword puzzles and family feels her memory may be slightly improved.  She had bilateral pedal edema and this was felt to be related to Norvasc and dose has been reduced to 5 mg which has helped.  She was seen in the ED on 10/20/2020 with a recurrence of atrial fibrillation but fortunately spontaneously reverted to sinus rhythm.  She has kept a record of her palpitations and she is probably had A. fib in a couple of other occasions as well most recently on 11/15/2020 when she felt dizzy lightheaded and had some headache and shortness of breath.  She was seen by cardiologist Dr. And today who continued on her medications.  She has an appointment to see Dr. [name] in September she and her family are leaning towards getting the watchman device done.  She is presently on aspirin 81 mg daily which is tolerating well without bruising or bleeding.  She denies any other definite symptoms of stroke or TIA.  She and family decided not to participate in the ASPIRE stroke prevention study.  She continues to have significant bilateral hearing loss but has a amplifier today which helps her communicate and hear. Update 06/08/2021 : She returns  for follow-up after last visit 6 months ago.  She is accompanied by her husband.  Patient states she is doing well she has had no recurrent TIA or stroke symptoms.  Patient had watchman device placed for left atrial occlusion on 01/27/2021 by Dr. [name].  Procedure went well.  She is currently still on Eliquis 2.5 twice daily and tolerating it well without bleeding or bruising.   She had a brief transient episode of A. fib 3 weeks ago.  She has had no recurrent stroke or TIA symptoms.  She continues to have mild gait instability and occasionally stumbles but has had no falls or injuries.  Her blood pressure is usually well controlled today slightly elevated at 146/70.  She has no new complaints.  She states her memory difficulties unchanged and not progressive. ROS:    14 system review of systems is positive for stumbling, gait difficulty, dizziness, imbalance,memory loss, difficulty focussing, multitasking and all systems negative  PMH:  Past Medical History:  Diagnosis Date   (HFpEF) heart failure with preserved ejection fraction ([place])    a. 08/2019 Echo: EF 60-65%, no rwma, mild LVH, Gr2 DD, nl RV fxn. Nl pASP. Mildly dil LA. Mild to mod MR.   Anemia    Anxiety    Arthritis    BRCA gene mutation negative 10/2016   NEGATIVE: [facility]   Breast cancer ([place]) 10/16/2016   T1c,N0;, GRADE I/III, 1.6cm. ER/PR pos  HER2 not over expressed, Right Upper Outer   Celiac disease    Depression    Dyspnea    WITH EXERTION   GERD (gastroesophageal reflux disease)    OCC   Glaucoma    right eye   Headache    H/O MIGRAINES AS TEENAGER   Heart murmur    a. 08/2019 Echo: mild to mod MR.   Hypertension    Hypothyroidism    Non-obstructive CAD (coronary artery disease)    a. 01/2017 MV: Hypertensive response. No ischemia/infarct. EF >65%; b. 09/2019 Cor CTA: Ca2+ = 9.29 (36th percentile). LAD calcified plaque (0-24%), otw nl. Multipel bilat pulm nodules up to 47m.   PAF (paroxysmal atrial fibrillation) ([place]    a. 02/2020 Zio: predominantly RSR, 65 (50-105), rare PACs/PVCs, 8 beats NSVT, multiple episodes of PAF lasting up to 1hr 270ms. Avg AF rate 130 (93-170). AF burden <1%. Triggered events = RSR, PACs, and PAF; b. CHA2DS2VASc = 6.   Pneumonia    YEARS AGO   Pulmonary nodules    a. 09/2019 Cor CTA: incidental finding of multiple bilat pulm nodules; b. 12/2019 High Res CT:  stable, scattered solid pulm nodules.   Raynaud's disease    RLS (restless legs syndrome)    [facility]    Social History:  Social History   Socioeconomic History   Marital status: Married    Spouse name: HoNadara Mustard Number of children: 2   Years of education: Not on file   Highest education level: Some college, no degree  Occupational History   Occupation: retired  Tobacco Use   Smoking status: Never   Smokeless tobacco: Never  Vaping Use   Vaping Use: Never used  Substance and Sexual Activity   Alcohol use: Not Currently   Drug use: No   Sexual activity: Not Currently    Birth control/protection: None  Other Topics Concern   Not on file  Social History Narrative   Lives with Husband   Right handed   Drinks 2-3 cups caffiene daily  Social Determinants of Health   Financial Resource Strain: Low Risk    Difficulty of Paying Living Expenses: Not hard at all  Food Insecurity: No Food Insecurity   Worried About Charity fundraiser in the Last Year: Never true   Stout in the Last Year: Never true  Transportation Needs: No Transportation Needs   Lack of Transportation (Medical): No   Lack of Transportation (Non-Medical): No  Physical Activity: Insufficiently Active   Days of Exercise per Week: 3 days   Minutes of Exercise per Session: 30 min  Stress: No Stress Concern Present   Feeling of Stress : Only a little  Social Connections: Engineer, building services of Communication with Friends and Family: More than three times a week   Frequency of Social Gatherings with Friends and Family: More than three times a week   Attends Religious Services: More than 4 times per year   Active Member of [employer] or Organizations: Yes   Attends Music therapist: More than 4 times per year   Marital Status: Married  Human resources officer Violence: Not At Risk   Fear of Current or Ex-Partner: No   Emotionally Abused: No   Physically Abused: No   Sexually  Abused: No    Medications:   Current Outpatient Medications on File Prior to Visit  Medication Sig Dispense Refill   ALPRAZolam (XANAX) 0.5 MG tablet TAKE 1 TABLET BY MOUTH AT  BEDTIME 90 tablet 1   amLODipine (NORVASC) 10 MG tablet Take 5 mg by mouth daily.     apixaban (ELIQUIS) 5 MG TABS tablet Take 2.5 mg by mouth 2 (two) times daily.     atorvastatin (LIPITOR) 40 MG tablet TAKE 1 TABLET BY MOUTH  DAILY 90 tablet 0   bisacodyl 5 MG EC tablet Take 5 mg by mouth daily as needed for moderate constipation.     Calcium Carb-Cholecalciferol (CALCIUM 600 + D PO) Take 1 tablet by mouth daily.     Cholecalciferol (VITAMIN D3) 125 MCG (5000 UT) TABS Take 5,000 Units by mouth daily.     docusate sodium (COLACE) 100 MG capsule Take 100 mg by mouth daily as needed for mild constipation.     hydroxypropyl methylcellulose / hypromellose (ISOPTO TEARS / GONIOVISC) 2.5 % ophthalmic solution Place 1 drop into both eyes 4 (four) times daily as needed for dry eyes.     latanoprost (XALATAN) 0.005 % ophthalmic solution Place 1 drop into both eyes at bedtime.  5   letrozole (FEMARA) 2.5 MG tablet Take 1 tablet (2.5 mg total) by mouth daily. 90 tablet 3   levothyroxine (SYNTHROID) 100 MCG tablet TAKE 1 TABLET BY MOUTH 30  MINUTES BEFORE BREAKFAST 90 tablet 1   metoprolol succinate (TOPROL XL) 25 MG 24 hr tablet Take 0.5 tablets (12.5 mg total) by mouth daily. At bedtime 45 tablet 3   Multiple Vitamin (MULTIVITAMIN WITH MINERALS) TABS tablet Take 1 tablet by mouth daily at 3 pm.     mupirocin nasal ointment (BACTROBAN) 2 % Place 1 application into the nose 2 (two) times daily as needed (staph infection). Use one-half of tube in each nostril twice daily for five (5) days. After application, press sides of nose together and gently massage.     omeprazole (PRILOSEC) 40 MG capsule TAKE 1 CAPSULE BY MOUTH DAILY 90 capsule 1   polyethylene glycol (MIRALAX / GLYCOLAX) 17 g packet Take 17 g by mouth daily as needed for  moderate constipation.  sertraline (ZOLOFT) 100 MG tablet TAKE 1 TABLET BY MOUTH  DAILY 90 tablet 1   timolol (BETIMOL) 0.5 % ophthalmic solution Place 1 drop into both eyes 2 (two) times daily.     No current facility-administered medications on file prior to visit.    Allergies:  No Active Allergies  Physical Exam General: frail elderly caucasian lady  seated, in no evident distress Head: head normocephalic and atraumatic.   Neck: supple with no carotid or supraclavicular bruits Cardiovascular: regular rate and rhythm, no murmurs Musculoskeletal: no deformity Skin:  no rash/petichiae Vascular:  Normal pulses all extremities  Neurologic Exam Mental Status: Awake and fully alert. Oriented to place and time. Recent and remote memory intact. Attention span, concentration and fund of knowledge appropriate. Mood and affect appropriate. . Cranial Nerves: Fundoscopic exam not done s. Pupils equal, briskly reactive to light. Extraocular movements full without nystagmus. Visual fields full to confrontation. Hearing intact. Facial sensation intact. Face, tongue, palate moves normally and symmetrically.  Motor: Normal bulk and tone. Normal strength in all tested extremity muscles. Sensory.: intact to touch , pinprick , position and vibratory sensation.  Coordination: Rapid alternating movements normal in all extremities. Finger-to-nose and heel-to-shin performed accurately bilaterally. Gait and Station: Arises from chair without difficulty. Stance is normal. Gait demonstrates normal stride length and balance . Able to heel, toe and tandem walk with moderate difficulty.  Reflexes: 1+ and symmetric. Toes downgoing.     ASSESSMENT: 52 year Caucasian lady with small left cerebellar hemorrhage in February 2022 from atrial fibrillation while anticoagulated with eliquis. She is doing well but has mild post stroke cognitive impairment.  She has had recurrent episodes of paroxysmal A. fib and now she is  s/p having watchman device placed in September 2022.    PLAN: I had a long discussion the patient and her husband regarding her remote intracerebral hemorrhage, atrial fibrillation and recent watchman procedure and answered questions.  Continue Eliquis and the current dose of 2.5 mg twice daily for now but I wonder if she can come off it and switch to aspirin as its been more than 3 months and I will discuss this with Dr. [name].  Continue strict control of hypertension with blood pressure goal below 140/90.  Check screening carotid ultrasound study.  Return for follow-up in the future only as necessary.I have spent a total of  35 minutes  with the patient reviewing hospital notes,  test results, labs and examining the patient as well as establishing an assessment and plan that was discussed personally with the patient.  > 50% of time was spent in direct patient care.    Antony Contras, MD Note: This document was prepared with digital dictation and possible smart phrase technology. Any transcriptional errors that result from this process are unintentional.

## 2021-06-15 ENCOUNTER — Encounter (HOSPITAL_COMMUNITY): Payer: Self-pay | Admitting: Physician Assistant

## 2021-06-15 ENCOUNTER — Ambulatory Visit (HOSPITAL_COMMUNITY)
Admission: RE | Admit: 2021-06-15 | Discharge: 2021-06-15 | Disposition: A | Discharge status: Home / Self Care | Payer: Medicare Other | Source: Ambulatory Visit | Attending: Physician Assistant | Admitting: Physician Assistant

## 2021-06-15 ENCOUNTER — Other Ambulatory Visit: Payer: Self-pay

## 2021-06-15 VITALS — BP 124/60 | HR 60 | Ht 64.0 in | Wt 134.8 lb

## 2021-06-15 DIAGNOSIS — I5030 Unspecified diastolic (congestive) heart failure: Secondary | ICD-10-CM | POA: Insufficient documentation

## 2021-06-15 DIAGNOSIS — Z7901 Long term (current) use of anticoagulants: Secondary | ICD-10-CM | POA: Diagnosis not present

## 2021-06-15 DIAGNOSIS — Z79899 Other long term (current) drug therapy: Secondary | ICD-10-CM | POA: Insufficient documentation

## 2021-06-15 DIAGNOSIS — I110 Hypertensive heart disease with heart failure: Secondary | ICD-10-CM | POA: Insufficient documentation

## 2021-06-15 DIAGNOSIS — I2510 Atherosclerotic heart disease of native coronary artery without angina pectoris: Secondary | ICD-10-CM | POA: Diagnosis not present

## 2021-06-15 DIAGNOSIS — I629 Nontraumatic intracranial hemorrhage, unspecified: Secondary | ICD-10-CM | POA: Insufficient documentation

## 2021-06-15 DIAGNOSIS — Z95818 Presence of other cardiac implants and grafts: Secondary | ICD-10-CM | POA: Insufficient documentation

## 2021-06-15 DIAGNOSIS — I480 Paroxysmal atrial fibrillation: Secondary | ICD-10-CM | POA: Diagnosis not present

## 2021-06-15 DIAGNOSIS — D6869 Other thrombophilia: Secondary | ICD-10-CM

## 2021-06-15 NOTE — Progress Notes (Signed)
Primary Care Physician: Jerrol Banana., MD Primary Cardiologist: Dr End  Primary Electrophysiologist: Dr [name] Referring Physician: Dr Verlan Friends is a 76 y.o. female with a history of intracranial hemorrhage, HTN, CAD, and atrial fibrillation who presents for follow up in the [facility]. Patient has a CHADS2VASC score of 5. Patient was seen by Dr [name] for Sandra [name] device consideration. She is not felt to be a candidate for long term anticoagulation due to intracranial hemorrhage while on Eliquis 06/2020. Eliquis was reversed at that time. She was taken off anticoagulation but remained on ASA. She is s/p Watchman implant on 01/27/21. Patient reported on her follow up 03/03/21 that she was having more frequent episodes of afib with symptoms of SOB, dizziness, and fatigue. Her flecainide was increased but her symptoms worsened. Flecainide was discontinued on 03/11/21 and she has not had any symptoms since.   On follow up today, patient reports she has done well since her last visit. She did have one episode of afib on 05/25/21 which lasted for several hours. There were no specific triggers that she could identify. She denies any bleeding issues on anticoagulation presently.   Today, she denies symptoms of palpitations, chest pain, shortness of breath, orthopnea, PND, lower extremity edema, dizziness, presyncope, syncope, snoring, daytime somnolence, bleeding, or neurologic sequela. The patient is tolerating medications without difficulties and is otherwise without complaint today.    Atrial Fibrillation Risk Factors:  she does not have symptoms or diagnosis of sleep apnea. she does not have a history of rheumatic fever.   she has a BMI of Body mass index is 23.14 kg/m.[name] Filed Weights   06/15/21 1323  Weight: 61.1 kg      Family History  Problem Relation Age of Onset   Cancer Mother    Anemia Mother    Other Mother         lymphosarcoma   Alcohol abuse Father    Prostate cancer Father    Breast cancer Sister        31; 1/2 sister, shared mother.   Cancer Sister        bile duct   Stroke Maternal Grandmother    Breast cancer Maternal Grandmother 68   Heart attack Maternal Grandfather    CAD Paternal Grandfather      Atrial Fibrillation Management history:  Previous antiarrhythmic drugs: flecainide  Previous cardioversions: none Previous ablations: none CHADS2VASC score: 5 Anticoagulation history: Eliquis   Past Medical History:  Diagnosis Date   (HFpEF) heart failure with preserved ejection fraction ([place])    a. 08/2019 Echo: EF 60-65%, no rwma, mild LVH, Gr2 DD, nl RV fxn. Nl pASP. Mildly dil LA. Mild to mod MR.   Anemia    Anxiety    Arthritis    BRCA gene mutation negative 10/2016   NEGATIVE: [employer]   Breast cancer ([place]) 10/16/2016   T1c,N0;, GRADE I/III, 1.6cm. ER/PR pos  HER2 not over expressed, Right Upper Outer   Celiac disease    Depression    Dyspnea    WITH EXERTION   GERD (gastroesophageal reflux disease)    OCC   Glaucoma    right eye   Headache    H/O MIGRAINES AS TEENAGER   Heart murmur    a. 08/2019 Echo: mild to mod MR.   Hypertension    Hypothyroidism    Non-obstructive CAD (coronary artery disease)    a. 01/2017 MV: Hypertensive response. No ischemia/infarct.  EF >65%; b. 09/2019 Cor CTA: Ca2+ = 9.29 (36th percentile). LAD calcified plaque (0-24%), otw nl. Multipel bilat pulm nodules up to 28m.   PAF (paroxysmal atrial fibrillation) ([place]    a. 02/2020 Zio: predominantly RSR, 65 (50-105), rare PACs/PVCs, 8 beats NSVT, multiple episodes of PAF lasting up to 1hr 259ms. Avg AF rate 130 (93-170). AF burden <1%. Triggered events = RSR, PACs, and PAF; b. CHA2DS2VASc = 6.   Pneumonia    YEARS AGO   Pulmonary nodules    a. 09/2019 Cor CTA: incidental finding of multiple bilat pulm nodules; b. 12/2019 High Res CT: stable, scattered solid pulm nodules.   Raynaud's disease     RLS (restless legs syndrome)    [facility]   Past Surgical History:  Procedure Laterality Date   ABDOMINAL HYSTERECTOMY     APPENDECTOMY     AXILLARY LYMPH NODE BIOPSY Right 03/05/2017   Procedure: AXILLARY LYMPH NODE BIOPSY;  Surgeon: ByRobert BellowMD;  Location: [facility] OR[facility];  Service: General;  Laterality: Right;   CATARACT EXTRACTION Bilateral    CESAREAN SECTION     COLONOSCOPY WITH PROPOFOL N/A 05/19/2015   Procedure: COLONOSCOPY WITH PROPOFOL;  Surgeon: RoManya SilvasMD;  [facility];  Service: Endoscopy;  Laterality: N/A;   COLONOSCOPY WITH PROPOFOL N/A 08/01/2019   Procedure: COLONOSCOPY WITH PROPOFOL;  Surgeon: WoLucilla LameMD;  [name];  Service: Endoscopy;  Laterality: N/A;   EYE SURGERY     HAND SURGERY Left    LEFT ATRIAL APPENDAGE OCCLUSION N/A 01/27/2021   Procedure: LEFT ATRIAL APPENDAGE OCCLUSION;  Surgeon: LaVickie EpleyMD;  [facility];  Service: Cardiovascular;  Laterality: N/A;   MASTECTOMY Right 11/07/2016   RESIDUAL INVASIVE MAMMARY CARCINOMA, SUBAREOLAR ANTERIOR TO PREVIOUS    SENTINEL NODE BIOPSY Right 11/07/2016   Procedure: SENTINEL NODE BIOPSY;  Surgeon: ByRobert BellowMD;  Location: [facility];  Service: General;  Laterality: Right;   SIMPLE MASTECTOMY WITH AXILLARY SENTINEL NODE BIOPSY Right 11/07/2016   6 mm ER/PR 100%; Her 2 neu not overexpressed, T1b, N0.  Surgeon: ByRobert BellowMD;  Location: [facility];  Service: General;  Laterality: Right;   TEE WITHOUT CARDIOVERSION N/A 01/27/2021   Procedure: TRANSESOPHAGEAL ECHOCARDIOGRAM (TEE);  Surgeon: LaVickie EpleyMD;  [facility];  Service: Cardiovascular;  Laterality: N/A;   TEE WITHOUT CARDIOVERSION N/A 03/10/2021   Procedure: TRANSESOPHAGEAL ECHOCARDIOGRAM (TEE);  Surgeon: AcElouise MunroeMD;  [place] Service: Cardiology;  Laterality: N/A;   TONSILLECTOMY  AGE 80   UPPER GI ENDOSCOPY  01/12/06    hiatus hernia    Current Outpatient Medications  Medication Sig Dispense Refill   ALPRAZolam (XANAX) 0.5 MG tablet TAKE 1 TABLET BY MOUTH AT  BEDTIME 90 tablet 1   amLODipine (NORVASC) 10 MG tablet Take 5 mg by mouth daily.     atorvastatin (LIPITOR) 40 MG tablet TAKE 1 TABLET BY MOUTH  DAILY 90 tablet 0   bisacodyl 5 MG EC tablet Take 5 mg by mouth daily as needed for moderate constipation.     Calcium Carb-Cholecalciferol (CALCIUM 600 + D PO) Take 1 tablet by mouth daily.     Cholecalciferol (VITAMIN D3) 125 MCG (5000 UT) TABS Take 5,000 Units by mouth daily.     docusate sodium (COLACE) 100 MG capsule Take 100 mg by mouth daily as needed for mild constipation.     ELIQUIS 2.5 MG TABS tablet  Take 2.5 mg by mouth 2 (two) times daily.     hydroxypropyl methylcellulose / hypromellose (ISOPTO TEARS / GONIOVISC) 2.5 % ophthalmic solution Place 1 drop into both eyes 4 (four) times daily as needed for dry eyes.     latanoprost (XALATAN) 0.005 % ophthalmic solution Place 1 drop into both eyes at bedtime.  5   letrozole (FEMARA) 2.5 MG tablet Take 1 tablet (2.5 mg total) by mouth daily. 90 tablet 3   levothyroxine (SYNTHROID) 100 MCG tablet TAKE 1 TABLET BY MOUTH 30  MINUTES BEFORE BREAKFAST 90 tablet 1   metoprolol succinate (TOPROL XL) 25 MG 24 hr tablet Take 0.5 tablets (12.5 mg total) by mouth daily. At bedtime 45 tablet 3   Multiple Vitamin (MULTIVITAMIN WITH MINERALS) TABS tablet Take 1 tablet by mouth daily at 3 pm.     mupirocin nasal ointment (BACTROBAN) 2 % Place 1 application into the nose 2 (two) times daily as needed (staph infection). Use one-half of tube in each nostril twice daily for five (5) days. After application, press sides of nose together and gently massage.     omeprazole (PRILOSEC) 40 MG capsule TAKE 1 CAPSULE BY MOUTH DAILY 90 capsule 1   polyethylene glycol (MIRALAX / GLYCOLAX) 17 g packet Take 17 g by mouth daily as needed for moderate constipation.     sertraline (ZOLOFT)  100 MG tablet TAKE 1 TABLET BY MOUTH  DAILY 90 tablet 1   timolol (BETIMOL) 0.5 % ophthalmic solution Place 1 drop into both eyes 2 (two) times daily.     No current facility-administered medications for this encounter.    No Known Allergies  Social History   Socioeconomic History   Marital status: Married    Spouse name: Nadara Mustard   Number of children: 2   Years of education: Not on file   Highest education level: Some college, no degree  Occupational History   Occupation: retired  Tobacco Use   Smoking status: Never   Smokeless tobacco: Never  Vaping Use   Vaping Use: Never used  Substance and Sexual Activity   Alcohol use: Not Currently   Drug use: No   Sexual activity: Not Currently    Birth control/protection: None  Other Topics Concern   Not on file  Social History Narrative   Lives with Husband   Right handed   Drinks 2-3 cups caffiene daily   Social Determinants of Health   Financial Resource Strain: Low Risk    Difficulty of Paying Living Expenses: Not hard at all  Food Insecurity: No Food Insecurity   Worried About Charity fundraiser in the Last Year: Never true   [place] in the Last Year: Never true  Transportation Needs: No Transportation Needs   Lack of Transportation (Medical): No   Lack of Transportation (Non-Medical): No  Physical Activity: Insufficiently Active   Days of Exercise per Week: 3 days   Minutes of Exercise per Session: 30 min  Stress: No Stress Concern Present   Feeling of Stress : Only a little  Social Connections: Engineer, building services of Communication with Friends and Family: More than three times a week   Frequency of Social Gatherings with Friends and Family: More than three times a week   Attends Religious Services: More than 4 times per year   Active Member of [employer] or Organizations: Yes   Attends Music therapist: More than 4 times per year   Marital Status: Married  Intimate Partner Violence:  Not At Risk   Fear of Current or Ex-Partner: No   Emotionally Abused: No   Physically Abused: No   Sexually Abused: No     ROS- All systems are reviewed and negative except as per the HPI above.  Physical Exam: Vitals:   06/15/21 1323  BP: 124/60  Pulse: 60  Weight: 61.1 kg  Height: _0  (1.626 m)    GEN- The patient is a well appearing elderly female, alert and oriented x 3 today.   HEENT-head normocephalic, atraumatic, sclera clear, conjunctiva pink, hearing intact, trachea midline. Lungs- Clear to ausculation bilaterally, normal work of breathing Heart- Regular rate and rhythm, no murmurs, rubs or gallops  GI- soft, NT, ND, + BS Extremities- no clubbing, cyanosis, or edema MS- no significant deformity or atrophy Skin- no rash or lesion Psych- euthymic mood, full affect Neuro- strength and sensation are intact   Wt Readings from Last 3 Encounters:  06/15/21 61.1 kg  06/08/21 61.2 kg  03/15/21 61.8 kg    EKG today demonstrates  SR, LVH Vent. rate 60 BPM PR interval 164 ms QRS duration 88 ms QT/QTcB 432/432 ms  Echo 07/16/20 demonstrated   1. Left ventricular ejection fraction, by estimation, is 65 to 70%. The left ventricle has normal function. The left ventricle has no regional wall motion abnormalities. There is moderate left ventricular hypertrophy. Left ventricular diastolic parameters are consistent with Grade II diastolic dysfunction (pseudonormalization).   2. Right ventricular systolic function is normal. The right ventricular  size is normal.   3. Left atrial size was mildly dilated.   4. Right atrial size was mildly dilated.   5. The mitral valve is normal in structure. Mild mitral valve  regurgitation.   6. The aortic valve is normal in structure. Aortic valve regurgitation is trivial.    Epic records are reviewed at length today   CHA2DS2-VASc Score = 5  The patient's score is based upon: CHF History: 0 HTN History: 1 Diabetes History:  0 Stroke History: 0 Vascular Disease History: 1 Age Score: 2 Gender Score: 1      ASSESSMENT AND PLAN: 1. Paroxysmal Atrial Fibrillation (ICD10:  I48.0) The patient's CHA2DS2-VASc score is 5, indicating a 7.2% annual risk of stroke.   S/p Watchman implant 01/27/21 Continue Eliquis 2.5 mg BID monotherapy for 6 months post Watchman.  Continue Toprol 12.5 mg daily If she has significant return of her afib, would consider dofetilide. She did not tolerate flecainide.   2. Secondary Hypercoagulable State (ICD10:  D68.69) The patient is at significant risk for stroke/thromboembolism based upon her CHA2DS2-VASc Score of 5.  Continue Apixaban (Eliquis). See plans above.  3. HTN Stable, no changes today.  4. CAD No anginal symptoms.   Follow up with Dr [name] as scheduled.    Inman [name] [address] [place], [place] [address] [phone] 06/15/2021 2:03 PM

## 2021-06-16 NOTE — Progress Notes (Signed)
Cardiology Office Note    Date:  06/20/2021   ID:  Sandra [name], DOB [birth year], MRN [id number]  PCP:  Sandra [name]., MD  Cardiologist:  Sandra Bush, MD  Electrophysiologist:  Sandra Epley, MD   Chief Complaint: Follow-up  History of Present Illness:   Sandra [name] is a 76 y.o. female with history of nonobstructive CAD, HFpEF, PAF complicated by intracranial hemorrhage in 06/2020 in the setting of a mechanical fall now s/p Watchman implantation on 9/1/022, right-sided breast cancer status post mastectomy without chemoradiation, HTN, celiac disease, Raynaud's disease, hypothyroidism, RLS, GERD, and anxiety who presents for follow up of A. fib.   She was previously evaluated by Dr. Clayborn [name] for preoperative evaluation in the setting of right mastectomy in the summer 2018.  She was found to have an abnormal EKG and cleared for surgery without additional testing.  She indicated she had abnormal EKGs dating back to 2012 at which time she was evaluated by Dr. Ubaldo [name] and underwent stress test which was normal per her report.    She has subsequently established with Dr. Saunders [name] in 12/2016.  Echo on 01/18/2017 showed an EF of 60 to 65%, mild concentric LVH with moderate hypertrophy of the septum with near cavity obliteration with systole with no significant outflow tract gradient measured, no regional wall motion abnormalities, grade 2 diastolic dysfunction, mild mitral regurgitation, mildly dilated left atrium, normal RV systolic function and RVSP.  Treadmill MPI in 01/2017 showed a hypertensive response to exercise with no significant ischemia or infarct.  LVEF was hyperdynamic at greater than 65%.  Echo in 08/2019 showed an EF of 60-65%, no RWMA, mild LVH, Gr2DD, normal RVSF and ventricular cavity size, normal PASP, mildly dilated left atrium, mild to moderate mitral regurgitation. Coronary CTA in 09/2019 showed a calcium score of 9 which was 35th percentile with calcified plaque in the  proximal LAD estimated at 0 to 24% with otherwise nonobstructive disease.  Outpatient cardiac monitor in 01/2020 showed a predominant rhythm of sinus with multiple episodes of PAF lasting up to 1 hour 25 minutes.  Overall A. fib burden was less than 1%.  She was initiated on apixaban.  In 06/2020, she had a prolonged episode of Afib associated with a severe headache.  EMS evaluated Sandra [name], but she refused transfer to ED.  She subsequently developed dizziness and nausea; she was advised to go to the ED for further evaluation.  There she was found to have an acute hemorrhagic infarct in the left cerebellum.  Echo during that admission showed an EF of 65 to 70%, no regional wall motion normalities, moderate LVH, grade 2 diastolic dysfunction, normal RV systolic function and ventricular cavity size, mild biatrial enlargement, mild mitral regurgitation, and trivial aortic insufficiency.  Anticoagulation was reversed with Kcentra.  Repeat CT showed stable hemorrhage.  She was taken off anticoagulation and continued on aspirin 81 mg.  She was subsequently evaluated by EP with recommendation to proceed with left atrial appendage occluder device.  She underwent watchman implantation on 01/27/2021.  Following this, she was under increased stress and noticed an increase in her palpitation burden.  TEE on 03/10/2021 demonstrated an EF of 60 to 65% with a well-seated left atrial appendage occluder device with no residual flow.  No left atrial or left atrial appendage thrombus was detected.  She has since been seen by the A. fib clinic, most recently on 06/15/2021.  It was noted flecainide had previously been discontinued secondary to intolerance.  She was maintaining sinus rhythm.  She comes in doing very well from a cardiac perspective.  Since she was last seen she has had only 1 episode of tachypalpitations consistent with A. fib, on 12/28 with heart rates in the low 100s to 120s bpm.  She did take an extra half metoprolol,  though did not notice any significant change in her symptoms with this.  Upon waking up the next day heart rates were improved and she felt like she was back in normal rhythm.  She remains on apixaban without symptoms concerning for bleeding or falls.  She will follow-up with EP on 07/27/2021 to discuss discontinuation of [place].  No symptoms concerning for angina or decompensation.  She remains very active and is the primary caretaker for her husband.  No significant lower extremity swelling.  She is pleased with her current progress.   Labs independently reviewed: 02/2021 - HGB 11.2, PLT 281, BUN 14, SCr 0.72, potassium 4.0 12/2020 - A1c 6.0 09/2020 - magnesium 2.3, albumin 4.4, AST/ALT normal 06/2020 - TSH 5.570, TC 137, TG 95, HDL 50, LDL 68  Past Medical History:  Diagnosis Date   (HFpEF) heart failure with preserved ejection fraction ([place])    a. 08/2019 Echo: EF 60-65%, no rwma, mild LVH, Gr2 DD, nl RV fxn. Nl pASP. Mildly dil LA. Mild to mod MR.   Anemia    Anxiety    Arthritis    BRCA gene mutation negative 10/2016   NEGATIVE: [employer]   Breast cancer ([place]) 10/16/2016   T1c,N0;, GRADE I/III, 1.6cm. ER/PR pos  HER2 not over expressed, Right Upper Outer   Celiac disease    Depression    Dyspnea    WITH EXERTION   GERD (gastroesophageal reflux disease)    OCC   Glaucoma    right eye   Headache    H/O MIGRAINES AS TEENAGER   Heart murmur    a. 08/2019 Echo: mild to mod MR.   Hypertension    Hypothyroidism    Non-obstructive CAD (coronary artery disease)    a. 01/2017 MV: Hypertensive response. No ischemia/infarct. EF >65%; b. 09/2019 Cor CTA: Ca2+ = 9.29 (36th percentile). LAD calcified plaque (0-24%), otw nl. Multipel bilat pulm nodules up to 57m.   PAF ([place]oxysmal [place] fibrillation) [place] [place]    a. 02/2020 Zio: predominantly RSR, 65 (50-105), rare PACs/PVCs, 8 beats NSVT, multiple episodes of PAF lasting up to 1hr 28ms. Avg AF rate 130 (93-170). AF burden <1%. Triggered events = RSR,  PACs, and PAF; b. CHA2DS2VASc = 6.   Pneumonia    YEARS AGO   Pulmonary nodules    a. 09/2019 Cor CTA: incidental finding of multiple bilat pulm nodules; b. 12/2019 High Res CT: stable, scattered solid pulm nodules.   Raynaud's disease    RLS (restless legs syndrome)    [facility]    Past Surgical History:  Procedure Laterality Date   ABDOMINAL HYSTERECTOMY     APPENDECTOMY     AXILLARY LYMPH NODE BIOPSY Right 03/05/2017   Procedure: AXILLARY LYMPH NODE BIOPSY;  Surgeon: ByRobert BellowMD;  Location: [facility] OR[facility];  Service: General;  Laterality: Right;   CATARACT EXTRACTION Bilateral    CESAREAN SECTION     COLONOSCOPY WITH PROPOFOL N/A 05/19/2015   Procedure: COLONOSCOPY WITH PROPOFOL;  Surgeon: RoManya SilvasMD;  [facility];  Service: Endoscopy;  Laterality: N/A;   COLONOSCOPY WITH PROPOFOL N/A 08/01/2019   Procedure: COLONOSCOPY WITH PROPOFOL;  Surgeon: WoLucilla LameMD;  Location: [facility] ENDOSCOPY;  Service: Endoscopy;  Laterality: N/A;   EYE SURGERY     HAND SURGERY Left    LEFT ATRIAL APPENDAGE OCCLUSION N/A 01/27/2021   Procedure: LEFT ATRIAL APPENDAGE OCCLUSION;  Surgeon: Sandra Epley, MD;  Location: [facility] CV LAB;  Service: Cardiovascular;  Laterality: N/A;   MASTECTOMY Right 11/07/2016   RESIDUAL INVASIVE MAMMARY CARCINOMA, SUBAREOLAR ANTERIOR TO PREVIOUS    SENTINEL NODE BIOPSY Right 11/07/2016   Procedure: SENTINEL NODE BIOPSY;  Surgeon: Robert Bellow, MD;  Location: [facility] OR[facility];  Service: General;  Laterality: Right;   SIMPLE MASTECTOMY WITH AXILLARY SENTINEL NODE BIOPSY Right 11/07/2016   6 mm ER/PR 100%; Her 2 neu not overexpressed, T1b, N0.  Surgeon: Robert Bellow, MD;  Location: [facility];  Service: General;  Laterality: Right;   TEE WITHOUT CARDIOVERSION N/A 01/27/2021   Procedure: TRANSESOPHAGEAL ECHOCARDIOGRAM (TEE);  Surgeon: Sandra Epley, MD;  Location: [facility];  Service: Cardiovascular;  Laterality: N/A;   TEE WITHOUT  CARDIOVERSION N/A 03/10/2021   Procedure: TRANSESOPHAGEAL ECHOCARDIOGRAM (TEE);  Surgeon: Elouise Munroe, MD;  Location: [facility];  Service: Cardiology;  Laterality: N/A;   TONSILLECTOMY  AGE 89   UPPER GI ENDOSCOPY  01/12/06   hiatus hernia    Current Medications: Current Meds  Medication Sig   ALPRAZolam (XANAX) 0.5 MG tablet TAKE 1 TABLET BY MOUTH AT  BEDTIME   amLODipine (NORVASC) 10 MG tablet Take 5 mg by mouth daily.   atorvastatin (LIPITOR) 40 MG tablet TAKE 1 TABLET BY MOUTH  DAILY   bisacodyl 5 MG EC tablet Take 5 mg by mouth daily as needed for moderate constipation.   Calcium Carb-Cholecalciferol (CALCIUM 600 + D PO) Take 1 tablet by mouth daily.   Cholecalciferol (VITAMIN D3) 125 MCG (5000 UT) TABS Take 5,000 Units by mouth daily.   docusate sodium (COLACE) 100 MG capsule Take 100 mg by mouth daily as needed for mild constipation.   ELIQUIS 2.5 MG TABS tablet Take 2.5 mg by mouth 2 (two) times daily.   hydroxypropyl methylcellulose / hypromellose (ISOPTO TEARS / GONIOVISC) 2.5 % ophthalmic solution Place 1 drop into both eyes 4 (four) times daily as needed for dry eyes.   latanoprost (XALATAN) 0.005 % ophthalmic solution Place 1 drop into both eyes at bedtime.   letrozole (FEMARA) 2.5 MG tablet Take 1 tablet (2.5 mg total) by mouth daily.   levothyroxine (SYNTHROID) 100 MCG tablet TAKE 1 TABLET BY MOUTH 30  MINUTES BEFORE BREAKFAST   metoprolol succinate (TOPROL XL) 25 MG 24 hr tablet Take 0.5 tablets (12.5 mg total) by mouth daily. At bedtime   Multiple Vitamin (MULTIVITAMIN WITH MINERALS) TABS tablet Take 1 tablet by mouth daily at 3 pm.   mupirocin nasal ointment (BACTROBAN) 2 % Place 1 application into the nose 2 (two) times daily as needed (staph infection). Use one-half of tube in each nostril twice daily for five (5) days. After application, press sides of nose together and gently massage.   omeprazole (PRILOSEC) 40 MG capsule TAKE 1 CAPSULE BY MOUTH DAILY    polyethylene glycol (MIRALAX / GLYCOLAX) 17 g packet Take 17 g by mouth daily as needed for moderate constipation.   sertraline (ZOLOFT) 100 MG tablet TAKE 1 TABLET BY MOUTH  DAILY   timolol (BETIMOL) 0.5 % ophthalmic solution Place 1 drop into both eyes 2 (two) times daily.    Allergies:   Patient has no known allergies.   Social History  Socioeconomic History   Marital status: Married    Spouse name: Nadara Mustard   Number of children: 2   Years of education: Not on file   Highest education level: Some college, no degree  Occupational History   Occupation: retired  Tobacco Use   Smoking status: Never   Smokeless tobacco: Never  Vaping Use   Vaping Use: Never used  Substance and Sexual Activity   Alcohol use: Not Currently   Drug use: No   Sexual activity: Not Currently    Birth control/protection: None  Other Topics Concern   Not on file  Social History Narrative   Lives with Husband   Right handed   Drinks 2-3 cups caffiene daily   Social Determinants of Health   Financial Resource Strain: Low Risk    Difficulty of Paying Living Expenses: Not hard at all  Food Insecurity: No Food Insecurity   Worried About Charity fundraiser in the Last Year: Never true   [place] in the Last Year: Never true  Transportation Needs: No Transportation Needs   Lack of Transportation (Medical): No   Lack of Transportation (Non-Medical): No  Physical Activity: Insufficiently Active   Days of Exercise per Week: 3 days   Minutes of Exercise per Session: 30 min  Stress: No Stress Concern Present   Feeling of Stress : Only a little  Social Connections: Engineer, building services of Communication with Friends and Family: More than three times a week   Frequency of Social Gatherings with Friends and Family: More than three times a week   Attends Religious Services: More than 4 times per year   Active Member of [employer] or Organizations: Yes   Attends Music therapist:  More than 4 times per year   Marital Status: Married     Family History:  The patient's family history includes Alcohol abuse in her father; Anemia in her mother; Breast cancer in her sister; Breast cancer (age of onset: 62) in her maternal grandmother; CAD in her paternal grandfather; Cancer in her mother and sister; Heart attack in her maternal grandfather; Other in her mother; Prostate cancer in her father; Stroke in her maternal grandmother.  ROS:   12-point review of system is negative unless otherwise noted in the HPI.   EKGs/Labs/Other Studies Reviewed:    Studies reviewed were summarized above. The additional studies were reviewed today:  TEE 03/10/2021:  1. 24 mm Watchman FLX left atrial appendage occluder well seated, no  residual flow. . Left atrial size was moderately dilated. No left  atrial/left atrial appendage thrombus was detected.   2. Left ventricular ejection fraction, by estimation, is 60 to 65%. The  left ventricle has normal function.   3. Right ventricular systolic function is normal. The right ventricular  size is normal.   4. Right atrial size was moderately dilated.   5. The mitral valve is grossly normal.   6. The aortic valve is normal in structure. Aortic valve regurgitation is  trivial.   7. There is mild (Grade II) atheroma plaque involving the transverse  aorta.   8. Evidence of atrial level shunting detected by color flow Doppler.  There is a small iatrogenic mid septal atrial septal defect with  predominantly left to right shunting across the atrial septum. __________  TEE 01/27/2021: 1. Left ventricular ejection fraction, by estimation, is 60 to 65%. The  left ventricle has normal function. The left ventricle has no regional  wall motion  abnormalities.   2. Right ventricular systolic function is normal. The right ventricular  size is normal.   3. Well-seated Watchman FLX 24 mm device, without peri-device leak. There  is a tiny mobile  filamentous structure, maximum length 3 mm, diameter< 0.5  mm attached to the tip of the "coumadin ridge" that was seen throughout  the procedure, unchanged at the  end of the procedure. There is a small iatrogenic ASD with exclusively  left-to-right shunt after transseptal puncture. No left atrial/left atrial  appendage thrombus was detected.   4. The mitral valve is normal in structure. Mild mitral valve  regurgitation. No evidence of mitral stenosis.   5. The aortic valve is tricuspid. Aortic valve regurgitation is trivial.  Mild aortic valve sclerosis is present, with no evidence of aortic valve  stenosis.   6. The inferior vena cava is normal in size with greater than 50%  respiratory variability, suggesting right atrial pressure of 3 mmHg.   Conclusion(s)/Recommendation(s): TEE and 3D imaging with reconstruction  was used throughout the procedure to guide transseptal puncture, device  deployment, assess final result and assess for periprocedural  complications. __________  2D echo 07/17/2020: 1. Left ventricular ejection fraction, by estimation, is 65 to 70%. The  left ventricle has normal function. The left ventricle has no regional  wall motion abnormalities. There is moderate left ventricular hypertrophy.  Left ventricular diastolic  parameters are consistent with Grade II diastolic dysfunction  (pseudonormalization).   2. Right ventricular systolic function is normal. The right ventricular  size is normal.   3. Left atrial size was mildly dilated.   4. Right atrial size was mildly dilated.   5. The mitral valve is normal in structure. Mild mitral valve  regurgitation.   6. The aortic valve is normal in structure. Aortic valve regurgitation is  trivial. ___________  Elwyn Reach patch 01/2020: The patient was monitored for 14 days. The predominant rhythm was sinus with an average rate of 65 bpm (range 50 to 105 bpm in sinus). Rare PACs and PVCs were observed. A single 8 beat run  of nonsustained ventricular tachycardia was observed with a maximum rate of 162 bpm. Multiple episodes of paroxysmal atrial fibrillation occurred, lasting up to 1 hour, 25 minutes. Average ventricular rate was 130 bpm (range 93 to 170 bpm). Atrial fibrillation burden was less than 1%. Patient triggered events correspond to sinus rhythm, PACs, and paroxysmal atrial fibrillation.   Predominately sinus rhythm with paroxysmal atrial fibrillation (a-fib burden <1%).  Rare PACs and PVCs as well as single brief run of NSVT noted. __________  2D echo 09/12/2019: 1. Left ventricular ejection fraction, by estimation, is 60 to 65%. The  left ventricle has normal function. The left ventricle has no regional  wall motion abnormalities. There is mild left ventricular hypertrophy.  Left ventricular diastolic parameters  are consistent with Grade II diastolic dysfunction (pseudonormalization).   2. Right ventricular systolic function is normal. The right ventricular  size is normal. There is normal pulmonary artery systolic pressure.   3. Left atrial size was mildly dilated.   4. Mild to moderate mitral valve regurgitation. __________  Carlton Adam MPI 02/15/2017: Blood pressure demonstrated a hypertensive response to exercise with elevated BP at baseline. The study is normal. This is a low risk study. The left ventricular ejection fraction is hyperdynamic (>65%). Non diagnostic ECG due to abnormal baseline. ___________  2D echo 01/18/2017: - Left ventricle: The cavity size was normal. There was mild    concentric LVH with  moderate hypertrophy of the septum, 1.65 cm.    Near cavity obliteration with systole. No significant outflow    tract gradient measured. Systolic function was normal. The    estimated ejection fraction was in the range of 60% to 65%. Wall    motion was normal; there were no regional wall motion    abnormalities. Features are consistent with a pseudonormal left    ventricular filling  pattern, with concomitant abnormal relaxation    and increased filling pressure (grade 2 diastolic dysfunction).  - Mitral valve: There was mild regurgitation.  - Left atrium: The atrium was mildly dilated.  - Right ventricle: Systolic function was normal.  - Pulmonary arteries: Systolic pressure was within the normal    range.   EKG:  EKG is not ordered today.    Recent Labs: 07/22/2020: TSH 5.570 10/20/2020: ALT 31; B Natriuretic Peptide 243.0; Magnesium 2.3 03/03/2021: BUN 14; Creatinine, Ser 0.72; Hemoglobin 11.2; Platelets 281; Potassium 4.0; Sodium 141  Recent Lipid Panel    Component Value Date/Time   CHOL 137 07/17/2020 0436   CHOL 133 04/28/2020 1039   TRIG 95 07/17/2020 0436   HDL 50 07/17/2020 0436   HDL 50 04/28/2020 1039   CHOLHDL 2.7 07/17/2020 0436   VLDL 19 07/17/2020 0436   LDLCALC 68 07/17/2020 0436   LDLCALC 67 04/28/2020 1039    PHYSICAL EXAM:    VS:  BP 130/62 (BP Location: Left Arm, Patient Position: Sitting, Cuff Size: Normal)    Pulse 60    Ht '5\' 3"'  (1.6 m)    Wt 141 lb (64 kg)    SpO2 97%    BMI 24.98 kg/m   BMI: Body mass index is 24.98 kg/m.  Physical Exam Constitutional:      Appearance: She is well-developed.  HENT:     Head: Normocephalic and atraumatic.  Eyes:     General:        Right eye: No discharge.        Left eye: No discharge.  Neck:     Vascular: No JVD.  Cardiovascular:     Rate and Rhythm: Normal rate and regular rhythm.     Pulses:          Posterior tibial pulses are 2+ on the right side and 2+ on the left side.     Heart sounds: Normal heart sounds, S1 normal and S2 normal. Heart sounds not distant. No midsystolic click and no opening snap. No murmur heard.   No friction rub.  Pulmonary:     Effort: Pulmonary effort is normal. No respiratory distress.     Breath sounds: Normal breath sounds. No decreased breath sounds, wheezing or rales.  Chest:     Chest wall: No tenderness.  Abdominal:     General: There is no  distension.     Palpations: Abdomen is soft.     Tenderness: There is no abdominal tenderness.  Musculoskeletal:     Cervical back: Normal range of motion.     Right lower leg: No edema.     Left lower leg: No edema.  Skin:    General: Skin is warm and dry.     Nails: There is no clubbing.  Neurological:     Mental Status: She is alert and oriented to person, place, and time.  Psychiatric:        Speech: Speech normal.        Behavior: Behavior normal.        Thought  Content: Thought content normal.        Judgment: Judgment normal.    Wt Readings from Last 3 Encounters:  06/20/21 141 lb (64 kg)  06/15/21 134 lb 12.8 oz (61.1 kg)  06/08/21 135 lb (61.2 kg)     ASSESSMENT & PLAN:   HFpEF: She appears euvolemic and well compensated.  Not currently requiring a standing diuretic.  Continue optimal blood pressure control.  Nonobstructive CAD: No symptoms suggestive of angina.  For now, she remains on apixaban in place of aspirin to minimize bleeding risk.  She will also continue atorvastatin and metoprolol.  No indication for further ischemic testing at this time.  PAF: Maintaining sinus rhythm based on symptoms and physical exam.  Not a long-term OAC candidate given history of intracranial hemorrhage.  Now status post left atrial occluder device implantation on 01/27/2021.  [place] will be continued for a total of 6 months from date of occluder device implantation.  She will follow-up with EP as scheduled on 07/27/2021 with further recommendations regarding [place] discontinuation at that time.  She has had 1 episode of tachypalpitations consistent with prior A. fib on the evening of 12/28.  For now, given minimal A. fib burden she will continue Toprol-XL at 12.5 mg daily with an extra half tab as needed for tachypalpitations.  Follow-up with EP.  History of intracranial hemorrhage: Planning to discontinue [place] as outlined above.  Has been followed by neurology.  HTN: Blood pressure is reasonably  controlled in the office today.  Continue Toprol-XL and amlodipine.   Disposition: F/u with Dr. [name] as scheduled on 07/27/2021.   Medication Adjustments/Labs and Tests Ordered: Current medicines are reviewed at length with the patient today.  Concerns regarding medicines are outlined above. Medication changes, Labs and Tests ordered today are summarized above and listed in the Patient Instructions accessible in Encounters.   Signed, Christell Faith, PA-C 06/20/2021 11:51 AM     [facility] [address] [place] [address] [place], [place] [address] [phone]

## 2021-06-20 ENCOUNTER — Other Ambulatory Visit: Payer: Self-pay

## 2021-06-20 ENCOUNTER — Encounter: Payer: Self-pay | Admitting: Physician Assistant

## 2021-06-20 ENCOUNTER — Ambulatory Visit: Payer: Medicare Other | Admitting: Physician Assistant

## 2021-06-20 VITALS — BP 130/62 | HR 60 | Ht 63.0 in | Wt 141.0 lb

## 2021-06-20 DIAGNOSIS — Z8679 Personal history of other diseases of the circulatory system: Secondary | ICD-10-CM

## 2021-06-20 DIAGNOSIS — I480 Paroxysmal atrial fibrillation: Secondary | ICD-10-CM | POA: Diagnosis not present

## 2021-06-20 DIAGNOSIS — Z95818 Presence of other cardiac implants and grafts: Secondary | ICD-10-CM | POA: Diagnosis not present

## 2021-06-20 DIAGNOSIS — I2510 Atherosclerotic heart disease of native coronary artery without angina pectoris: Secondary | ICD-10-CM | POA: Diagnosis not present

## 2021-06-20 DIAGNOSIS — I5032 Chronic diastolic (congestive) heart failure: Secondary | ICD-10-CM | POA: Diagnosis not present

## 2021-06-20 DIAGNOSIS — I10 Essential (primary) hypertension: Secondary | ICD-10-CM | POA: Diagnosis not present

## 2021-06-20 NOTE — Patient Instructions (Signed)
Medication Instructions:  No changes at this time.   *If you need a refill on your cardiac medications before your next appointment, please call your pharmacy*   Lab Work: None  If you have labs (blood work) drawn today and your tests are completely normal, you will receive your results only by: [facility] (if you have MyChart) OR A paper copy in the mail If you have any lab test that is abnormal or we need to change your treatment, we will call you to review the results.   Testing/Procedures: None   Follow-Up: At [facility], you and your health needs are our priority.  As part of our continuing mission to provide you with exceptional heart care, we have created designated Provider Care Teams.  These Care Teams include your primary Cardiologist (physician) and Advanced Practice Providers (APPs -  Physician Assistants and Nurse Practitioners) who all work together to provide you with the care you need, when you need it.   Your next appointment:   July 27, 2021 at 10:00 am  The format for your next appointment:   In Person  Provider:   Lars Mage, MD

## 2021-06-22 ENCOUNTER — Ambulatory Visit (INDEPENDENT_AMBULATORY_CARE_PROVIDER_SITE_OTHER): Payer: Medicare Other | Admitting: Family Medicine

## 2021-06-22 ENCOUNTER — Encounter: Payer: Self-pay | Admitting: Family Medicine

## 2021-06-22 ENCOUNTER — Ambulatory Visit: Payer: Medicare Other | Admitting: Family Medicine

## 2021-06-22 ENCOUNTER — Other Ambulatory Visit: Payer: Self-pay

## 2021-06-22 VITALS — BP 128/74 | HR 58 | Temp 98.3°F | Resp 16 | Ht 64.0 in | Wt 133.0 lb

## 2021-06-22 DIAGNOSIS — K900 Celiac disease: Secondary | ICD-10-CM

## 2021-06-22 DIAGNOSIS — I10 Essential (primary) hypertension: Secondary | ICD-10-CM | POA: Diagnosis not present

## 2021-06-22 DIAGNOSIS — R7303 Prediabetes: Secondary | ICD-10-CM | POA: Diagnosis not present

## 2021-06-22 DIAGNOSIS — F329 Major depressive disorder, single episode, unspecified: Secondary | ICD-10-CM

## 2021-06-22 DIAGNOSIS — I619 Nontraumatic intracerebral hemorrhage, unspecified: Secondary | ICD-10-CM

## 2021-06-22 DIAGNOSIS — I4891 Unspecified atrial fibrillation: Secondary | ICD-10-CM | POA: Diagnosis not present

## 2021-06-22 DIAGNOSIS — E785 Hyperlipidemia, unspecified: Secondary | ICD-10-CM | POA: Diagnosis not present

## 2021-06-22 DIAGNOSIS — E038 Other specified hypothyroidism: Secondary | ICD-10-CM

## 2021-06-22 DIAGNOSIS — G2581 Restless legs syndrome: Secondary | ICD-10-CM

## 2021-06-22 DIAGNOSIS — C50911 Malignant neoplasm of unspecified site of right female breast: Secondary | ICD-10-CM | POA: Diagnosis not present

## 2021-06-22 DIAGNOSIS — I5032 Chronic diastolic (congestive) heart failure: Secondary | ICD-10-CM

## 2021-06-22 NOTE — Progress Notes (Signed)
° ° °  SUBJECTIVE:   CHIEF COMPLAINT / HPI:   Hypertension, PAfib, HFpEF, HLD, h/o ICH: - saw Cardiology 1/23 - Medications: amlodipine, metoprolol, lipitor, eliquis - coming off eliquis in March due to h/o ICH. - no falls in months.  - Compliance: good - Checking BP at home: not currently - Denies any SOB, CP, LE edema, medication SEs, or symptoms of hypotension  Hypothyroidism - Medications: Synthroid 117mg - Current symptoms:  occasional palpitations with Afib.  - Denies change in energy level - Symptoms have stabilized  Prediabetes - Last A1c 6.0 - Medications: none - Compliance: n/a - Denies symptoms of hypoglycemia, polyuria, polydipsia, numbness extremities, foot ulcers/trauma  Anxiety, Depression - Medications: xanax prn at bedtime (primarily for RLS), zoloft - Taking: good compliance - Symptoms: mood stable but reports inability to cry or express emotions.  - Current stressors: husband with end stage COPD, frequent hospitalizations in the last year  Breast cancer - following with Onc. On letrozole, due to hopefully come off this year.   OBJECTIVE:   BP 128/74 (BP Location: Left Arm, Patient Position: Sitting, Cuff Size: Normal)    Pulse (!) 58    Temp 98.3 F (36.8 C) (Temporal)    Resp 16    Ht 5' 4"  (1.626 m)    Wt 133 lb (60.3 kg)    SpO2 97%    BMI 22.83 kg/m   Gen: well appearing, in NAD Card: RRR Lungs: CTAB Ext: WWP, no edema   ASSESSMENT/PLAN:   Chronic heart failure with preserved ejection fraction (HCC) Doing well. Euvolemic today. Continue current regimen.   Essential hypertension Doing well on current regimen, no changes made today.  Paroxysmal atrial fibrillation (HCC) RRR today. Planning to come off of eliquis in [place] Continue to follow with Cardiology.  Hypothyroidism Recheck labs today.  Hemorrhagic cerebrovascular accident (CVA) ([facility] Doing well. Counseled on adapting home for safety, and taking time changing positions  to avoid falls. Planning to come off eliquis in March.  Depression, major Doing well but wanting to come off zoloft. Counseled on risk of rebounding symptoms with dose decrease or discontinuation, elects to defer for now.  Hyperlipidemia LDL goal <70 Recheck labs. Tolerant of statin, continue.  Invasive ductal carcinoma of breast, female, right ([facility] Doing well. Continue to follow with Onc.  Pre-diabetes Recheck a1c.  RLS (restless legs syndrome) Doing well with xanax with appropriate use. Counseled on potential need for dose decrease in the future if develops worsened dizziness/sedation as she ages to avoid falls.      AMyles Gip DO

## 2021-06-22 NOTE — Assessment & Plan Note (Signed)
Doing well but wanting to come off zoloft. Counseled on risk of rebounding symptoms with dose decrease or discontinuation, elects to defer for now.

## 2021-06-22 NOTE — Assessment & Plan Note (Addendum)
Doing well with xanax with appropriate use. Counseled on potential need for dose decrease in the future if develops worsened dizziness/sedation as she ages to avoid falls.

## 2021-06-22 NOTE — Assessment & Plan Note (Signed)
RRR today. Planning to come off of eliquis in March due to [place]. Continue to follow with Cardiology.

## 2021-06-22 NOTE — Assessment & Plan Note (Signed)
Doing well. Counseled on adapting home for safety, and taking time changing positions to avoid falls. Planning to come off eliquis in March.

## 2021-06-22 NOTE — Assessment & Plan Note (Signed)
Recheck a1c.

## 2021-06-22 NOTE — Assessment & Plan Note (Signed)
Doing well. Continue to follow with Onc.

## 2021-06-22 NOTE — Assessment & Plan Note (Addendum)
Recheck labs. Tolerant of statin, continue.

## 2021-06-22 NOTE — Assessment & Plan Note (Signed)
Doing well. Euvolemic today. Continue current regimen.

## 2021-06-22 NOTE — Assessment & Plan Note (Signed)
Recheck labs today. 

## 2021-06-22 NOTE — Assessment & Plan Note (Signed)
Doing well on current regimen, no changes made today. 

## 2021-06-23 LAB — HEMOGLOBIN A1C
Est. average glucose Bld gHb Est-mCnc: 137 mg/dL
Hgb A1c MFr Bld: 6.4 % — ABNORMAL HIGH (ref 4.8–5.6)

## 2021-06-23 LAB — BASIC METABOLIC PANEL
BUN/Creatinine Ratio: 21 (ref 12–28)
BUN: 14 mg/dL (ref 8–27)
CO2: 26 mmol/L (ref 20–29)
Calcium: 8.7 mg/dL (ref 8.7–10.3)
Chloride: 103 mmol/L (ref 96–106)
Creatinine, Ser: 0.68 mg/dL (ref 0.57–1.00)
Glucose: 114 mg/dL — ABNORMAL HIGH (ref 70–99)
Potassium: 4 mmol/L (ref 3.5–5.2)
Sodium: 142 mmol/L (ref 134–144)
eGFR: 90 mL/min/{1.73_m2} (ref >59)

## 2021-06-23 LAB — LIPID PANEL
Chol/HDL Ratio: 2.7 ratio (ref 0.0–4.4)
Cholesterol, Total: 138 mg/dL (ref 100–199)
HDL: 52 mg/dL (ref >39)
LDL Chol Calc (NIH): 63 mg/dL (ref 0–99)
Triglycerides: 128 mg/dL (ref 0–149)
VLDL Cholesterol Cal: 23 mg/dL (ref 5–40)

## 2021-06-23 LAB — TSH: TSH: 2.43 u[IU]/mL (ref 0.450–4.500)

## 2021-07-11 ENCOUNTER — Other Ambulatory Visit: Payer: Self-pay | Admitting: Internal Medicine

## 2021-07-11 ENCOUNTER — Other Ambulatory Visit: Payer: Self-pay | Admitting: Family Medicine

## 2021-07-22 ENCOUNTER — Telehealth: Payer: Self-pay | Admitting: Cardiology

## 2021-07-22 NOTE — Telephone Encounter (Signed)
Advised the patient that the sleep number bed magnets will not cause problems to her watchman device. Verbalized understanding.

## 2021-07-22 NOTE — Telephone Encounter (Signed)
Per device clinic fwd to [place] for review for watchman

## 2021-07-22 NOTE — Telephone Encounter (Signed)
°  1. Has your device fired? no  2. Is you device beeping? no  3. Are you experiencing draining or swelling at device site? no  4. Are you calling to see if we received your device transmission? no  5. Have you passed out? No   Patient is having a sleep number bed delivered and was asked about devices that may have issues with the magnets.   Please advise on watchman and precautions.    Please route to [place]

## 2021-07-25 ENCOUNTER — Ambulatory Visit
Admission: RE | Admit: 2021-07-25 | Discharge: 2021-07-25 | Disposition: A | Discharge status: Home / Self Care | Payer: Medicare Other | Source: Ambulatory Visit | Attending: Internal Medicine | Admitting: Internal Medicine

## 2021-07-25 ENCOUNTER — Other Ambulatory Visit: Payer: Self-pay

## 2021-07-25 DIAGNOSIS — R918 Other nonspecific abnormal finding of lung field: Secondary | ICD-10-CM | POA: Insufficient documentation

## 2021-07-25 DIAGNOSIS — R911 Solitary pulmonary nodule: Secondary | ICD-10-CM | POA: Diagnosis not present

## 2021-07-27 ENCOUNTER — Other Ambulatory Visit: Payer: Self-pay

## 2021-07-27 ENCOUNTER — Ambulatory Visit: Payer: Medicare Other | Admitting: Cardiology

## 2021-07-27 ENCOUNTER — Encounter: Payer: Self-pay | Admitting: Cardiology

## 2021-07-27 VITALS — HR 59 | Ht 64.0 in

## 2021-07-27 DIAGNOSIS — Z95818 Presence of other cardiac implants and grafts: Secondary | ICD-10-CM | POA: Diagnosis not present

## 2021-07-27 DIAGNOSIS — Z8679 Personal history of other diseases of the circulatory system: Secondary | ICD-10-CM | POA: Diagnosis not present

## 2021-07-27 DIAGNOSIS — I480 Paroxysmal atrial fibrillation: Secondary | ICD-10-CM | POA: Diagnosis not present

## 2021-07-27 MED ORDER — ASPIRIN EC 81 MG PO TBEC: 81.0000 mg | DELAYED_RELEASE_TABLET | Freq: Every day | ORAL | 3 refills | Status: DC

## 2021-07-27 NOTE — Patient Instructions (Addendum)
Medications: ?Stop Eliquis ?Start Aspirin 81 mg daily ?Your physician recommends that you continue on your current medications as directed. Please refer to the Current Medication list given to you today. ?*If you need a refill on your cardiac medications before your next appointment, please call your pharmacy* ? ?Lab Work: ?None. ?If you have labs (blood work) drawn today and your tests are completely normal, you will receive your results only by: ?MyChart Message (if you have MyChart) OR ?A paper copy in the mail ?If you have any lab test that is abnormal or we need to change your treatment, we will call you to review the results. ? ?Testing/Procedures: ?None. ? ?Follow-Up: ?At [facility], you and your health needs are our priority.  As part of our continuing mission to provide you with exceptional heart care, we have created designated Provider Care Teams.  These Care Teams include your primary Cardiologist (physician) and Advanced Practice Providers (APPs -  Physician Assistants and Nurse Practitioners) who all work together to provide you with the care you need, when you need it. ? ?Your physician wants you to follow-up in: 12 months with the Afib Clinic. They will contact you to schedule.  ? ?We recommend signing up for the patient portal called "MyChart".  Sign up information is provided on this After Visit Summary.  MyChart is used to connect with patients for Virtual Visits (Telemedicine).  Patients are able to view lab/test results, encounter notes, upcoming appointments, etc.  Non-urgent messages can be sent to your provider as well.   ?To learn more about what you can do with MyChart, go to [email or web address].   ? ?Any Other Special Instructions Will Be Listed Below (If Applicable).  ?

## 2021-07-27 NOTE — Progress Notes (Signed)
Electrophysiology Office Follow up Visit Note:    Date:  07/27/2021   ID:  Sandra [name], DOB 05/04/1946, MRN [id number]  PCP:  Jerrol Banana., MD  [facility] Cardiologist:  Nelva Bush, MD  [facility] Electrophysiologist:  Vickie Epley, MD    Interval History:    Sandra [name] is a 76 y.o. female who presents for a follow up visit.  She had a successful watchman implant January 27, 2021.  She has been on Eliquis 2.5 mg by mouth twice daily.  Her burden of atrial fibrillation is low.  She is on metoprolol.  She has done well since I last saw her.  She is followed by pulmonology.     Past Medical History:  Diagnosis Date   (HFpEF) heart failure with preserved ejection fraction ([place])    a. 08/2019 Echo: EF 60-65%, no rwma, mild LVH, Gr2 DD, nl RV fxn. Nl pASP. Mildly dil LA. Mild to mod MR.   Anemia    Anxiety    Arthritis    BRCA gene mutation negative 10/2016   NEGATIVE: [facility]   Breast cancer ([place]) 10/16/2016   T1c,N0;, GRADE I/III, 1.6cm. ER/PR pos  HER2 not over expressed, Right Upper Outer   Celiac disease    Cerebellar hemorrhage, acute ([place]) 07/17/2020   Depression    Dyspnea    WITH EXERTION   GERD (gastroesophageal reflux disease)    OCC   Glaucoma    right eye   Headache    H/O MIGRAINES AS TEENAGER   Heart murmur    a. 08/2019 Echo: mild to mod MR.   Hypertension    Hypothyroidism    Non-obstructive CAD (coronary artery disease)    a. 01/2017 MV: Hypertensive response. No ischemia/infarct. EF >65%; b. 09/2019 Cor CTA: Ca2+ = 9.29 (36th percentile). LAD calcified plaque (0-24%), otw nl. Multipel bilat pulm nodules up to 28m.   PAF (paroxysmal atrial fibrillation) ([place]    a. 02/2020 Zio: predominantly RSR, 65 (50-105), rare PACs/PVCs, 8 beats NSVT, multiple episodes of PAF lasting up to 1hr 291ms. Avg AF rate 130 (93-170). AF burden <1%. Triggered events = RSR, PACs, and PAF; b. CHA2DS2VASc = 6.   Pneumonia    YEARS AGO   Pulmonary  nodules    a. 09/2019 Cor CTA: incidental finding of multiple bilat pulm nodules; b. 12/2019 High Res CT: stable, scattered solid pulm nodules.   Raynaud's disease    RLS (restless legs syndrome)    [facility]    Past Surgical History:  Procedure Laterality Date   ABDOMINAL HYSTERECTOMY     APPENDECTOMY     AXILLARY LYMPH NODE BIOPSY Right 03/05/2017   Procedure: AXILLARY LYMPH NODE BIOPSY;  Surgeon: ByRobert BellowMD;  Location: [facility] ORS;  Service: General;  Laterality: Right;   CATARACT EXTRACTION Bilateral    CESAREAN SECTION     COLONOSCOPY WITH PROPOFOL N/A 05/19/2015   Procedure: COLONOSCOPY WITH PROPOFOL;  Surgeon: RoManya SilvasMD;  [facility];  Service: Endoscopy;  Laterality: N/A;   COLONOSCOPY WITH PROPOFOL N/A 08/01/2019   Procedure: COLONOSCOPY WITH PROPOFOL;  Surgeon: WoLucilla LameMD;  [facility];  Service: Endoscopy;  Laterality: N/A;   EYE SURGERY     HAND SURGERY Left    LEFT ATRIAL APPENDAGE OCCLUSION N/A 01/27/2021   Procedure: LEFT ATRIAL APPENDAGE OCCLUSION;  Surgeon: LaVickie EpleyMD;  [facility];  Service: Cardiovascular;  Laterality: N/A;  MASTECTOMY Right 11/07/2016   RESIDUAL INVASIVE MAMMARY CARCINOMA, SUBAREOLAR ANTERIOR TO PREVIOUS    SENTINEL NODE BIOPSY Right 11/07/2016   Procedure: SENTINEL NODE BIOPSY;  Surgeon: Robert Bellow, MD;  Location: [facility] ORS;  Service: General;  Laterality: Right;   SIMPLE MASTECTOMY WITH AXILLARY SENTINEL NODE BIOPSY Right 11/07/2016   6 mm ER/PR 100%; Her 2 neu not overexpressed, T1b, N0.  Surgeon: Robert Bellow, MD;  Location: [facility] ORS;  Service: General;  Laterality: Right;   TEE WITHOUT CARDIOVERSION N/A 01/27/2021   Procedure: TRANSESOPHAGEAL ECHOCARDIOGRAM (TEE);  Surgeon: Vickie Epley, MD;  Location: [place] CV LAB;  Service: Cardiovascular;  Laterality: N/A;   TEE WITHOUT CARDIOVERSION N/A 03/10/2021   Procedure: TRANSESOPHAGEAL ECHOCARDIOGRAM  (TEE);  Surgeon: Elouise Munroe, MD;  Location: [place];  Service: Cardiology;  Laterality: N/A;   TONSILLECTOMY  AGE 30   UPPER GI ENDOSCOPY  01/12/06   hiatus hernia    Current Medications: Current Meds  Medication Sig   ALPRAZolam (XANAX) 0.5 MG tablet TAKE 1 TABLET BY MOUTH AT  BEDTIME   amLODipine (NORVASC) 10 MG tablet TAKE 1 TABLET BY MOUTH  DAILY   atorvastatin (LIPITOR) 40 MG tablet TAKE 1 TABLET BY MOUTH  DAILY   bisacodyl 5 MG EC tablet Take 5 mg by mouth daily as needed for moderate constipation.   Calcium Carb-Cholecalciferol (CALCIUM 600 + D PO) Take 1 tablet by mouth daily.   Cholecalciferol (VITAMIN D3) 125 MCG (5000 UT) TABS Take 5,000 Units by mouth daily.   docusate sodium (COLACE) 100 MG capsule Take 100 mg by mouth daily as needed for mild constipation.   ELIQUIS 2.5 MG TABS tablet Take 2.5 mg by mouth 2 (two) times daily.   hydroxypropyl methylcellulose / hypromellose (ISOPTO TEARS / GONIOVISC) 2.5 % ophthalmic solution Place 1 drop into both eyes 4 (four) times daily as needed for dry eyes.   latanoprost (XALATAN) 0.005 % ophthalmic solution Place 1 drop into both eyes at bedtime.   letrozole (FEMARA) 2.5 MG tablet Take 1 tablet (2.5 mg total) by mouth daily.   levothyroxine (SYNTHROID) 100 MCG tablet TAKE 1 TABLET BY MOUTH 30  MINUTES BEFORE BREAKFAST   metoprolol succinate (TOPROL XL) 25 MG 24 hr tablet Take 0.5 tablets (12.5 mg total) by mouth daily. At bedtime   Multiple Vitamin (MULTIVITAMIN WITH MINERALS) TABS tablet Take 1 tablet by mouth daily at 3 pm.   omeprazole (PRILOSEC) 40 MG capsule TAKE 1 CAPSULE BY MOUTH DAILY   polyethylene glycol (MIRALAX / GLYCOLAX) 17 g packet Take 17 g by mouth daily as needed for moderate constipation.   sertraline (ZOLOFT) 100 MG tablet TAKE 1 TABLET BY MOUTH  DAILY   timolol (BETIMOL) 0.5 % ophthalmic solution Place 1 drop into both eyes 2 (two) times daily.     Allergies:   Patient has no known allergies.    Social History   Socioeconomic History   Marital status: Married    Spouse name: Nadara Mustard   Number of children: 2   Years of education: Not on file   Highest education level: Some college, no degree  Occupational History   Occupation: retired  Tobacco Use   Smoking status: Never   Smokeless tobacco: Never  Vaping Use   Vaping Use: Never used  Substance and Sexual Activity   Alcohol use: Not Currently   Drug use: No   Sexual activity: Not Currently    Birth control/protection: None  Other Topics Concern  Not on file  Social History Narrative   Lives with Husband   Right handed   Drinks 2-3 cups caffiene daily   Social Determinants of Health   Financial Resource Strain: Low Risk    Difficulty of Paying Living Expenses: Not hard at all  Food Insecurity: No Food Insecurity   Worried About Charity fundraiser in the Last Year: Never true   Arboriculturist in the Last Year: Never true  Transportation Needs: No Transportation Needs   Lack of Transportation (Medical): No   Lack of Transportation (Non-Medical): No  Physical Activity: Insufficiently Active   Days of Exercise per Week: 3 days   Minutes of Exercise per Session: 30 min  Stress: No Stress Concern Present   Feeling of Stress : Only a little  Social Connections: Engineer, building services of Communication with Friends and Family: More than three times a week   Frequency of Social Gatherings with Friends and Family: More than three times a week   Attends Religious Services: More than 4 times per year   Active Member of [employer] or Organizations: Yes   Attends Music therapist: More than 4 times per year   Marital Status: Married     Family History: The patient's family history includes Alcohol abuse in her father; Anemia in her mother; Breast cancer in her sister; Breast cancer (age of onset: 85) in her maternal grandmother; CAD in her paternal grandfather; Cancer in her mother and sister; Heart  attack in her maternal grandfather; Other in her mother; Prostate cancer in her father; Stroke in her maternal grandmother.  ROS:   Please see the history of present illness.    All other systems reviewed and are negative.  EKGs/Labs/Other Studies Reviewed:    The following studies were reviewed today:   EKG:  The ekg ordered today demonstrates sinus rhythm.  LVH.  Secondary repolarization abnormality.  Recent Labs: 10/20/2020: ALT 31; B Natriuretic Peptide 243.0; Magnesium 2.3 03/03/2021: Hemoglobin 11.2; Platelets 281 06/22/2021: BUN 14; Creatinine, Ser 0.68; Potassium 4.0; Sodium 142; TSH 2.430  Recent Lipid Panel    Component Value Date/Time   CHOL 138 06/22/2021 1108   TRIG 128 06/22/2021 1108   HDL 52 06/22/2021 1108   CHOLHDL 2.7 06/22/2021 1108   CHOLHDL 2.7 07/17/2020 0436   VLDL 19 07/17/2020 0436   LDLCALC 63 06/22/2021 1108    Physical Exam:    VS:  Pulse (!) 59    Ht _0  (1.626 m)    Wt (P) 132 lb (59.9 kg)    SpO2 (P) 96%    BMI (P) 22.66 kg/m     Wt Readings from Last 3 Encounters:  07/27/21 (P) 132 lb (59.9 kg)  06/22/21 133 lb (60.3 kg)  06/20/21 141 lb (64 kg)     GEN:  Well nourished, well developed in no acute distress HEENT: Normal NECK: No JVD; No carotid bruits LYMPHATICS: No lymphadenopathy CARDIAC: RRR, no murmurs, rubs, gallops RESPIRATORY:  Clear to auscultation without rales, wheezing or rhonchi  ABDOMEN: Soft, non-tender, non-distended MUSCULOSKELETAL:  No edema; No deformity  SKIN: Warm and dry NEUROLOGIC:  Alert and oriented x 3 PSYCHIATRIC:  Normal affect        ASSESSMENT:    1. PAF (paroxysmal atrial fibrillation) ([place])   2. Presence of Watchman left atrial appendage closure device   3. History of intracranial hemorrhage    PLAN:    In order of problems listed above:  #  Paroxysmal atrial fibrillation #Watchman device in situ Patient is doing well after watchman implant.  She has been on Eliquis 2.5 mg by mouth twice  daily for 6 months after implant.  She can stop the Eliquis today and transition to aspirin 81 mg by mouth once daily.  She will continue on this indefinitely.  For now, continue metoprolol.  If atrial fibrillation burden increases in the future could consider initiation of an antiarrhythmic drug.  Follow-up with EP on an as-needed basis.   Medication Adjustments/Labs and Tests Ordered: Current medicines are reviewed at length with the patient today.  Concerns regarding medicines are outlined above.  No orders of the defined types were placed in this encounter.  No orders of the defined types were placed in this encounter.    Signed, Lars Mage, MD, [facility], [facility] 07/27/2021 10:46 AM    Electrophysiology  [facility]

## 2021-08-04 ENCOUNTER — Other Ambulatory Visit: Payer: Medicare Other

## 2021-08-30 DIAGNOSIS — H401131 Primary open-angle glaucoma, bilateral, mild stage: Secondary | ICD-10-CM | POA: Diagnosis not present

## 2021-09-02 ENCOUNTER — Other Ambulatory Visit: Payer: Self-pay | Admitting: Internal Medicine

## 2021-10-05 ENCOUNTER — Encounter: Payer: Medicare Other | Admitting: Family Medicine

## 2021-10-07 ENCOUNTER — Other Ambulatory Visit: Payer: Self-pay | Admitting: Cardiology

## 2021-11-07 ENCOUNTER — Other Ambulatory Visit: Payer: Self-pay | Admitting: General Surgery

## 2021-11-07 DIAGNOSIS — Z1231 Encounter for screening mammogram for malignant neoplasm of breast: Secondary | ICD-10-CM

## 2021-11-14 ENCOUNTER — Other Ambulatory Visit: Payer: Self-pay | Admitting: Family Medicine

## 2021-11-14 ENCOUNTER — Other Ambulatory Visit: Payer: Self-pay | Admitting: Cardiology

## 2021-11-14 DIAGNOSIS — F419 Anxiety disorder, unspecified: Secondary | ICD-10-CM

## 2021-11-15 ENCOUNTER — Telehealth: Payer: Self-pay | Admitting: Internal Medicine

## 2021-11-15 NOTE — Telephone Encounter (Signed)
Informed the patient she no longer needs abx for dental procedures as it is only needed 6 months post-LAAO. She was grateful for call and agrees with plan.

## 2021-11-15 NOTE — Telephone Encounter (Signed)
Patient returned call

## 2021-11-15 NOTE — Telephone Encounter (Signed)
Attempted to call pt. No answer. Lmtcb.   Reviewed pt's chart. She is followed by Dr. [name] for a fib and also sees the afib clinic.  Last seen by Christell Faith, PA-C in our office 06/20/21.   Will forward also to Dr. Mardene Speak nurse.

## 2021-11-15 NOTE — Telephone Encounter (Signed)
Pt states that she woke up this morning with a headache, sore throat, dizziness and walking to the left a little. Pt said that she's concerned and would like a nurse to call back. Please advise

## 2021-11-15 NOTE — Telephone Encounter (Signed)
Spoke with pt. Pt states that she is "now feeling much better." Reports this morning when she woke up she had a headache and felt dizzy. Then when walking she felt unsteady on her feet.   BP/HR this morning 154/76  61 then rechecked at 10 AM BP 144/65 HR remained stable.   Pt states she never had a sore throat, but did have some sinus congestion and possible drainage.  Pt states all symptoms have resolved except headache still lingering some but pt feels stable.   Advised pt to monitor s/s and let us know of any further changes.  ER precautions provided.   Pt proceeded to tell me that she actually had a dentist appointment coming up and needed to request antibiotic to take due to having Watchman. Pt states dentist appointment is for routine check up.   Notified pt I will make Dr. Mardene Speak nurse aware re antibiotic.

## 2021-12-02 ENCOUNTER — Other Ambulatory Visit: Payer: Self-pay | Admitting: Family Medicine

## 2021-12-02 DIAGNOSIS — F419 Anxiety disorder, unspecified: Secondary | ICD-10-CM

## 2021-12-02 NOTE — Telephone Encounter (Signed)
Requested medication (s) are due for refill today: yes  Requested medication (s) are on the active medication list: yes    Last refill: 05/31/21  #90  1 refill  Future visit scheduled no  Notes to clinic:Failed due to labs,please review. Thank you.  Requested Prescriptions  Pending Prescriptions Disp Refills   sertraline (ZOLOFT) 100 MG tablet [Pharmacy Med Name: Sertraline HCl 100 MG Oral Tablet] 90 tablet 3    Sig: TAKE 1 TABLET BY MOUTH  DAILY     Psychiatry:  Antidepressants - SSRI - sertraline Failed - 12/02/2021  2:38 PM      Failed - AST in normal range and within 360 days    AST  Date Value Ref Range Status  10/20/2020 27 15 - 41 U/L Final         Failed - ALT in normal range and within 360 days    ALT  Date Value Ref Range Status  10/20/2020 31 0 - 44 U/L Final         Passed - Completed PHQ-2 or PHQ-9 in the last 360 days      Passed - Valid encounter within last 6 months    Recent Outpatient Visits           5 months ago Essential hypertension   [facility] Myles Gip, DO   10 months ago Hemorrhagic stroke [facility])   [facility] Jerrol Banana., MD   1 year ago Encounter for Medicare annual wellness exam   [facility] Jerrol Banana., MD   1 year ago Cerebellar hemorrhage, acute [facility])   [facility] Jerrol Banana., MD   1 year ago Hemorrhagic stroke [facility])   [facility] Jerrol Banana., MD

## 2021-12-02 NOTE — Telephone Encounter (Signed)
Requested medication (s) are due for refill today:   Provider to review  Requested medication (s) are on the active medication list:   Yes  Future visit scheduled:   No   Last ordered: 11/16/2021 #90, 0 refills  Returned because it's a non delegated refill   Requested Prescriptions  Pending Prescriptions Disp Refills   ALPRAZolam (XANAX) 0.5 MG tablet [Pharmacy Med Name: ALPRAZolam 0.5 MG Oral Tablet] 90 tablet     Sig: TAKE 1 TABLET BY MOUTH AT  BEDTIME     Not Delegated - Psychiatry: Anxiolytics/Hypnotics 2 Failed - 12/02/2021  2:38 PM      Failed - This refill cannot be delegated      Failed - Urine Drug Screen completed in last 360 days      Passed - Patient is not pregnant      Passed - Valid encounter within last 6 months    Recent Outpatient Visits           5 months ago Essential hypertension   [facility] Myles Gip, DO   10 months ago Hemorrhagic stroke [facility])   [facility] Jerrol Banana., MD   1 year ago Encounter for Medicare annual wellness exam   [facility] Jerrol Banana., MD   1 year ago Cerebellar hemorrhage, acute [facility])   [facility] Jerrol Banana., MD   1 year ago Hemorrhagic stroke [facility])   [facility] Jerrol Banana., MD

## 2021-12-09 NOTE — Progress Notes (Unsigned)
Established patient visit   Patient: Sandra [name]   DOB: [birth year]   76 y.o. Female  MRN: [id number] Visit Date: 12/12/2021  Today's healthcare provider: [name], MD   No chief complaint on file.  Subjective    HPI  Overall patient is feeling well and better.  She and her husband finally have an empty nest as far as who is living in their house. She continues to complain of chronic ongoing bloating of her abdomen because she does not follow her gluten-free diet for celiac disease in which she does not follow it  she has bloating.  Hypertension, follow-up  BP Readings from Last 3 Encounters:  12/12/21 110/68  06/22/21 128/74  06/20/21 130/62   Wt Readings from Last 3 Encounters:  12/12/21 136 lb (61.7 kg)  07/27/21 (P) 132 lb (59.9 kg)  06/22/21 133 lb (60.3 kg)     She was last seen for hypertension 6 months ago.  Management since that visit includes; Doing well on current regimen, no changes made today..  Outside blood pressures are not being checked  Pertinent labs Lab Results  Component Value Date   CHOL 138 06/22/2021   HDL 52 06/22/2021   LDLCALC 63 06/22/2021   TRIG 128 06/22/2021   CHOLHDL 2.7 06/22/2021   Lab Results  Component Value Date   NA 142 06/22/2021   K 4.0 06/22/2021   CREATININE 0.68 06/22/2021   EGFR 90 06/22/2021   GLUCOSE 114 (H) 06/22/2021   TSH 2.430 06/22/2021     The 10-year ASCVD risk score (Arnett DK, et al., 2019) is: 17.3%  --------------------------------------------------------------------------------------------------- Prediabetes, Follow-up  Lab Results  Component Value Date   HGBA1C 6.1 (A) 12/12/2021   HGBA1C 6.4 (H) 06/22/2021   HGBA1C 6.0 (A) 01/19/2021   GLUCOSE 114 (H) 06/22/2021   GLUCOSE 91 03/03/2021   GLUCOSE 147 (H) 01/28/2021    Last seen for for this6 months ago.  Management since that visit includes; labs checked showing- A1c was elevated, but still in pre-diabetic  range.  Pertinent Labs:    Component Value Date/Time   CHOL 138 06/22/2021 1108   TRIG 128 06/22/2021 1108   CHOLHDL 2.7 06/22/2021 1108   CHOLHDL 2.7 07/17/2020 0436   CREATININE 0.68 06/22/2021 1108    Wt Readings from Last 3 Encounters:  12/12/21 136 lb (61.7 kg)  07/27/21 (P) 132 lb (59.9 kg)  06/22/21 133 lb (60.3 kg)    -----------------------------------------------------------------------------------------   Medications: Outpatient Medications Prior to Visit  Medication Sig   ALPRAZolam (XANAX) 0.5 MG tablet TAKE 1 TABLET BY MOUTH AT  BEDTIME   amLODipine (NORVASC) 10 MG tablet TAKE 1 TABLET BY MOUTH  DAILY   aspirin EC 81 MG tablet Take 1 tablet (81 mg total) by mouth daily. Swallow whole.   atorvastatin (LIPITOR) 40 MG tablet TAKE 1 TABLET BY MOUTH DAILY   Calcium Carb-Cholecalciferol (CALCIUM 600 + D PO) Take 1 tablet by mouth daily.   Cholecalciferol (VITAMIN D3) 125 MCG (5000 UT) TABS Take 5,000 Units by mouth daily.   docusate sodium (COLACE) 100 MG capsule Take 100 mg by mouth daily as needed for mild constipation.   hydroxypropyl methylcellulose / hypromellose (ISOPTO TEARS / GONIOVISC) 2.5 % ophthalmic solution Place 1 drop into both eyes 4 (four) times daily as needed for dry eyes.   latanoprost (XALATAN) 0.005 % ophthalmic solution Place 1 drop into both eyes at bedtime.   letrozole (FEMARA) 2.5 MG tablet Take 1  tablet (2.5 mg total) by mouth daily.   levothyroxine (SYNTHROID) 100 MCG tablet TAKE 1 TABLET BY MOUTH 30  MINUTES BEFORE BREAKFAST   metoprolol succinate (TOPROL-XL) 25 MG 24 hr tablet TAKE ONE-HALF TABLET BY  MOUTH DAILY AT BEDTIME   Multiple Vitamin (MULTIVITAMIN WITH MINERALS) TABS tablet Take 1 tablet by mouth daily at 3 pm.   omeprazole (PRILOSEC) 40 MG capsule TAKE 1 CAPSULE BY MOUTH DAILY   polyethylene glycol (MIRALAX / GLYCOLAX) 17 g packet Take 17 g by mouth daily as needed for moderate constipation.   sertraline (ZOLOFT) 100 MG tablet  TAKE 1 TABLET BY MOUTH DAILY   timolol (BETIMOL) 0.5 % ophthalmic solution Place 1 drop into both eyes 2 (two) times daily.   bisacodyl 5 MG EC tablet Take 5 mg by mouth daily as needed for moderate constipation.   mupirocin nasal ointment (BACTROBAN) 2 % Place 1 application into the nose 2 (two) times daily as needed (staph infection). Use one-half of tube in each nostril twice daily for five (5) days. After application, press sides of nose together and gently massage. (Patient not taking: Reported on 07/27/2021)   No facility-administered medications prior to visit.    Review of Systems  Constitutional:  Negative for appetite change, chills, fatigue and fever.  Respiratory:  Negative for chest tightness and shortness of breath.   Cardiovascular:  Negative for chest pain and palpitations.  Gastrointestinal:  Negative for abdominal pain, nausea and vomiting.  Endocrine: Negative for polydipsia and polyuria.  Neurological:  Negative for dizziness and weakness.    Last hemoglobin A1c Lab Results  Component Value Date   HGBA1C 6.1 (A) 12/12/2021       Objective    BP 110/68 (BP Location: Left Arm, Patient Position: Sitting, Cuff Size: Normal)   Pulse 70   Temp 98.1 F (36.7 C) (Oral)   Wt 136 lb (61.7 kg)   SpO2 97%   BMI 23.34 kg/m  BP Readings from Last 3 Encounters:  12/12/21 110/68  06/22/21 128/74  06/20/21 130/62   Wt Readings from Last 3 Encounters:  12/12/21 136 lb (61.7 kg)  07/27/21 (P) 132 lb (59.9 kg)  06/22/21 133 lb (60.3 kg)      Physical Exam Vitals reviewed.  Constitutional:      General: She is not in acute distress.    Appearance: She is well-developed.  HENT:     Head: Normocephalic and atraumatic.     Right Ear: Hearing normal.     Left Ear: Hearing normal.     Nose: Nose normal.  Eyes:     General: Lids are normal. No scleral icterus.       Right eye: No discharge.        Left eye: No discharge.     Conjunctiva/sclera: Conjunctivae normal.   Cardiovascular:     Rate and Rhythm: Normal rate and regular rhythm.     Heart sounds: Normal heart sounds.  Pulmonary:     Effort: Pulmonary effort is normal. No respiratory distress.  Abdominal:     General: There is no distension.     Palpations: Abdomen is soft.     Tenderness: There is no abdominal tenderness.  Skin:    Findings: No lesion or rash.  Neurological:     General: No focal deficit present.     Mental Status: She is alert and oriented to person, place, and time.  Psychiatric:        Mood and Affect: Mood normal.  Speech: Speech normal.        Behavior: Behavior normal.        Thought Content: Thought content normal.        Judgment: Judgment normal.       Results for orders placed or performed in visit on 12/12/21  POCT glycosylated hemoglobin (Hb A1C)  Result Value Ref Range   Hemoglobin A1C 6.1 (A) 4.0 - 5.6 %   HbA1c POC (<> result, manual entry)     HbA1c, POC (prediabetic range)     HbA1c, POC (controlled diabetic range)      Assessment & Plan     1. Essential hypertension Good control.  On amlodipine and metoprolol.  2. Pre-diabetes 1C is gone from 6.4-6.1. - POCT glycosylated hemoglobin (Hb A1C)  3. Generalized anxiety disorder Doing well on sertraline and is only taking the Xanax for sleep at night.  4. Invasive ductal carcinoma of breast, female, right ([place]) On Femara daily  5. Paroxysmal atrial fibrillation [place]) Patient had cerebral hemorrhage on anticoagulation on aspirin now  6. Chronic heart failure with preserved ejection fraction (HCC)   7. Celiac disease Patient encouraged to try the gluten-free diet.  8. Hemorrhagic cerebrovascular accident (CVA) ([place]) Had hemorrhage on anticoagulation for A-fib  9.  Insomnia Patient instructed to take less and less of this over time - ALPRAZolam (XANAX) 0.5 MG tablet; Take 1 tablet (0.5 mg total) by mouth at bedtime. AS NEEDED for sleep  Dispense: 90 tablet; Refill: 0   No  follow-ups on file.      I, [name], MD, have reviewed all documentation for this visit. The documentation on 12/13/21 for the exam, diagnosis, procedures, and orders are all accurate and complete.    Mitzi Lilja Cranford Mon, MD  [facility] [phone] (phone) [phone] (fax)  [place]

## 2021-12-12 ENCOUNTER — Ambulatory Visit (INDEPENDENT_AMBULATORY_CARE_PROVIDER_SITE_OTHER): Payer: Medicare Other | Admitting: Family Medicine

## 2021-12-12 ENCOUNTER — Ambulatory Visit
Admission: RE | Admit: 2021-12-12 | Discharge: 2021-12-12 | Disposition: A | Discharge status: Home / Self Care | Payer: Medicare Other | Source: Ambulatory Visit | Attending: General Surgery | Admitting: General Surgery

## 2021-12-12 VITALS — BP 110/68 | HR 70 | Temp 98.1°F | Wt 136.0 lb

## 2021-12-12 DIAGNOSIS — I5032 Chronic diastolic (congestive) heart failure: Secondary | ICD-10-CM

## 2021-12-12 DIAGNOSIS — C50911 Malignant neoplasm of unspecified site of right female breast: Secondary | ICD-10-CM | POA: Diagnosis not present

## 2021-12-12 DIAGNOSIS — R7303 Prediabetes: Secondary | ICD-10-CM

## 2021-12-12 DIAGNOSIS — F419 Anxiety disorder, unspecified: Secondary | ICD-10-CM

## 2021-12-12 DIAGNOSIS — I10 Essential (primary) hypertension: Secondary | ICD-10-CM | POA: Diagnosis not present

## 2021-12-12 DIAGNOSIS — I619 Nontraumatic intracerebral hemorrhage, unspecified: Secondary | ICD-10-CM | POA: Diagnosis not present

## 2021-12-12 DIAGNOSIS — F411 Generalized anxiety disorder: Secondary | ICD-10-CM | POA: Diagnosis not present

## 2021-12-12 DIAGNOSIS — I480 Paroxysmal atrial fibrillation: Secondary | ICD-10-CM | POA: Diagnosis not present

## 2021-12-12 DIAGNOSIS — K900 Celiac disease: Secondary | ICD-10-CM | POA: Diagnosis not present

## 2021-12-12 DIAGNOSIS — Z1231 Encounter for screening mammogram for malignant neoplasm of breast: Secondary | ICD-10-CM | POA: Insufficient documentation

## 2021-12-12 LAB — POCT GLYCOSYLATED HEMOGLOBIN (HGB A1C): Hemoglobin A1C: 6.1 % — AB (ref 4.0–5.6)

## 2021-12-13 MED ORDER — ALPRAZOLAM 0.5 MG PO TABS: 0.5000 mg | ORAL_TABLET | Freq: Every day | ORAL | 0 refills | Status: DC

## 2021-12-22 DIAGNOSIS — Z853 Personal history of malignant neoplasm of breast: Secondary | ICD-10-CM | POA: Diagnosis not present

## 2022-01-24 ENCOUNTER — Telehealth: Payer: Self-pay

## 2022-01-24 NOTE — Telephone Encounter (Signed)
Called to check in with patient, who had [place] on 01/27/2021. The patient reports doing well. She states that over the last couple weeks, she has had afib 3 times. Offered to schedule her in the Afib Clinic for follow-up but she declined. She understands to call Afib Clinic directly if she wishes to be evaluated.  The patient understands to call with questions or concerns.

## 2022-02-02 ENCOUNTER — Other Ambulatory Visit: Payer: Self-pay | Admitting: Family Medicine

## 2022-02-02 DIAGNOSIS — R1084 Generalized abdominal pain: Secondary | ICD-10-CM

## 2022-03-05 ENCOUNTER — Telehealth: Payer: Self-pay | Admitting: Family Medicine

## 2022-03-05 DIAGNOSIS — F419 Anxiety disorder, unspecified: Secondary | ICD-10-CM

## 2022-03-06 NOTE — Telephone Encounter (Signed)
Requested medication (s) are due for refill today: yes  Requested medication (s) are on the active medication list: yes  Last refill:  12/13/21 #90  Future visit scheduled: no  Notes to clinic:  called pt and LM on VM to call back to schedule transfer of care appt for med refill   Requested Prescriptions  Pending Prescriptions Disp Refills   ALPRAZolam (XANAX) 0.5 MG tablet [Pharmacy Med Name: ALPRAZolam 0.5 MG Oral Tablet] 90 tablet     Sig: TAKE 1 TABLET BY MOUTH AT  BEDTIME AS NEEDED FOR SLEEP     Not Delegated - Psychiatry: Anxiolytics/Hypnotics 2 Failed - 03/05/2022  9:39 AM      Failed - This refill cannot be delegated      Failed - Urine Drug Screen completed in last 360 days      Passed - Patient is not pregnant      Passed - Valid encounter within last 6 months    Recent Outpatient Visits           2 months ago Essential hypertension   [place] Jerrol Banana., MD   8 months ago Essential hypertension   [facility] Myles Gip, DO   1 year ago Hemorrhagic stroke [facility])   [facility] Jerrol Banana., MD   1 year ago Encounter for Medicare annual wellness exam   [facility] Jerrol Banana., MD   1 year ago Cerebellar hemorrhage, acute [facility])   [facility] Jerrol Banana., MD

## 2022-03-07 NOTE — Telephone Encounter (Signed)
Noted  

## 2022-03-07 NOTE — Telephone Encounter (Signed)
Patient called in wants to cancel this refill request. She is established with a different practice now[facility]

## 2022-03-20 ENCOUNTER — Telehealth: Payer: Self-pay | Admitting: Internal Medicine

## 2022-03-20 NOTE — Telephone Encounter (Addendum)
Received a call on the stat triage phone. Patient stated that she has experienced being "dizzy and light headed" several times this weekend, especially when she bends over. Patient stated that her apple watch alerted her that she was in Afib multiple times over the weekend. She did not know her BP, I had her check while we were on the phone and it was 128/57, HR 64. Patient stated that she has not been in Afib in a long time. She has a Forensic scientist in place and is on ASA 81 MG. She is also on Toprol XL 25 MG QD.   Patient states she continues to feel dizzy and light headed. I recommended that she stay hydrated, make slow postural changes, and continue taking her medications. I also advised that if her symptoms worsen she needs to be taken to the ED. Patient is now scheduled to see Dr. [name] on Wed. 10/25 in his DOD slot. Patient verbalized understanding and agreed with plan.  Dr. Claudie Revering last [facility] note from 07/2021 stated the following   #Paroxysmal atrial fibrillation #Watchman device in situ Patient is doing well after watchman implant.  She has been on Eliquis 2.5 mg by mouth twice daily for 6 months after implant.  She can stop the Eliquis today and transition to aspirin 81 mg by mouth once daily.  She will continue on this indefinitely.   For now, continue metoprolol.  If atrial fibrillation burden increases in the future could consider initiation of an antiarrhythmic drug.   Follow-up with EP on an as-needed basis.

## 2022-03-20 NOTE — Telephone Encounter (Signed)
Patient c/o Palpitations:  High priority if patient c/o lightheadedness, shortness of breath, or chest pain  How long have you had palpitations/irregular HR/ Afib? Are you having the symptoms now?   Symptoms started over the weekend  Are you currently experiencing lightheadedness, SOB or CP?   Lightheadedness, felt hot, almost passed out  Do you have a history of afib (atrial fibrillation) or irregular heart rhythm?  Yes  Have you checked your BP or HR? (document readings if available):   Did not check today  Are you experiencing any other symptoms?  Patient was feeling wobbly and a little dizzy.  Patient stated the metropolol has not been helping much.  Patient stated she is symptomatic.

## 2022-03-22 ENCOUNTER — Ambulatory Visit: Payer: Medicare Other | Attending: Internal Medicine | Admitting: Internal Medicine

## 2022-03-22 ENCOUNTER — Other Ambulatory Visit
Admission: RE | Admit: 2022-03-22 | Discharge: 2022-03-22 | Disposition: A | Discharge status: Home / Self Care | Payer: Medicare Other | Source: Ambulatory Visit | Attending: Internal Medicine | Admitting: Internal Medicine

## 2022-03-22 ENCOUNTER — Encounter: Payer: Self-pay | Admitting: Internal Medicine

## 2022-03-22 VITALS — BP 127/67 | HR 64 | Ht 63.0 in | Wt 133.0 lb

## 2022-03-22 DIAGNOSIS — I480 Paroxysmal atrial fibrillation: Secondary | ICD-10-CM | POA: Diagnosis present

## 2022-03-22 DIAGNOSIS — I2510 Atherosclerotic heart disease of native coronary artery without angina pectoris: Secondary | ICD-10-CM | POA: Diagnosis not present

## 2022-03-22 DIAGNOSIS — I5032 Chronic diastolic (congestive) heart failure: Secondary | ICD-10-CM | POA: Diagnosis not present

## 2022-03-22 LAB — COMPREHENSIVE METABOLIC PANEL
ALT: 35 U/L (ref 0–44)
AST: 36 U/L (ref 15–41)
Albumin: 3.7 g/dL (ref 3.5–5.0)
Alkaline Phosphatase: 74 U/L (ref 38–126)
Anion gap: 7 (ref 5–15)
BUN: 17 mg/dL (ref 8–23)
CO2: 26 mmol/L (ref 22–32)
Calcium: 8.8 mg/dL — ABNORMAL LOW (ref 8.9–10.3)
Chloride: 108 mmol/L (ref 98–111)
Creatinine, Ser: 0.65 mg/dL (ref 0.44–1.00)
GFR, Estimated: >60 mL/min (ref >60)
Glucose, Bld: 111 mg/dL — ABNORMAL HIGH (ref 70–99)
Potassium: 3.4 mmol/L — ABNORMAL LOW (ref 3.5–5.1)
Sodium: 141 mmol/L (ref 135–145)
Total Bilirubin: 0.8 mg/dL (ref 0.3–1.2)
Total Protein: 6.6 g/dL (ref 6.5–8.1)

## 2022-03-22 LAB — CBC
HCT: 32.5 % — ABNORMAL LOW (ref 36.0–46.0)
Hemoglobin: 10.1 g/dL — ABNORMAL LOW (ref 12.0–15.0)
MCH: 26.8 pg (ref 26.0–34.0)
MCHC: 31.1 g/dL (ref 30.0–36.0)
MCV: 86.2 fL (ref 80.0–100.0)
Platelets: 217 10*3/uL (ref 150–400)
RBC: 3.77 MIL/uL — ABNORMAL LOW (ref 3.87–5.11)
RDW: 14.1 % (ref 11.5–15.5)
WBC: 4.9 10*3/uL (ref 4.0–10.5)
nRBC: 0 % (ref 0.0–0.2)

## 2022-03-22 LAB — MAGNESIUM: Magnesium: 2 mg/dL (ref 1.7–2.4)

## 2022-03-22 LAB — TSH: TSH: 3.38 u[IU]/mL (ref 0.350–4.500)

## 2022-03-22 NOTE — Patient Instructions (Signed)
Medication Instructions:   Your physician recommends that you continue on your current medications as directed. Please refer to the Current Medication list given to you today.   *If you need a refill on your cardiac medications before your next appointment, please call your pharmacy*   Lab Work:  Please go to the [facility] after your appointment today for a CBC, CMP, Magnesium, and a TSH level  Testing/Procedures:  Your physician has requested that you have an echocardiogram. Echocardiography is a painless test that uses sound waves to create images of your heart. It provides your doctor with information about the size and shape of your heart and how well your heart's chambers and valves are working. This procedure takes approximately one hour. There are no restrictions for this procedure. Please do NOT wear cologne, perfume, aftershave, or lotions (deodorant is allowed). Please arrive 15 minutes prior to your appointment time.    Follow-Up: At [facility], you and your health needs are our priority.  As part of our continuing mission to provide you with exceptional heart care, we have created designated Provider Care Teams.  These Care Teams include your primary Cardiologist (physician) and Advanced Practice Providers (APPs -  Physician Assistants and Nurse Practitioners) who all work together to provide you with the care you need, when you need it.  We recommend signing up for the patient portal called "MyChart".  Sign up information is provided on this After Visit Summary.  MyChart is used to connect with patients for Virtual Visits (Telemedicine).  Patients are able to view lab/test results, encounter notes, upcoming appointments, etc.  Non-urgent messages can be sent to your provider as well.   To learn more about what you can do with MyChart, go to [email or web address].    Your next appointment:   6 month(s)  The format for your next appointment:   In  Person  Provider:   You may see Nelva Bush, MD or one of the following Advanced Practice Providers on your designated Care Team:   Murray Hodgkins, NP Christell Faith, PA-C Cadence Kathlen Mody, PA-C Gerrie Nordmann, NP    Other Instructions   Important Information About [place]

## 2022-03-22 NOTE — Progress Notes (Signed)
Follow-up Outpatient Visit Date: 03/22/2022  Primary Care Provider: Jerrol Banana., MD No address on file  Chief Complaint: Atrial fibrillation  HPI:  Ms. Victor is a 76 y.o. female with history of paroxysmal atrial fibrillation complicated by intracranial hemorrhage in 06/2020 in the setting of recurrent falls, HFpEF, right-sided breast cancer, celiac disease, Raunaud's disease, hypothyroidism, RLS, GERD, and anxiety, who presents for evaluation of palpitations and concern for recurrent atrial fibrillation.  I last saw her in 10/2020; she has been followed closely by Dr. [name] and the a-fib clinic since then, having undergone successful Watchman implantation in 01/2021.  She reached out to our office two days ago complaining of dizziness over the weekend.  She thinks this may have been brought on by stress associated with her daughter and son-in-law coming to visit her unexpectedly this weekend.  Her daughter noticed that Ms. Ragen's pulse seemed irregular, which she confirmed on an Apple Watch.  Ms. Mercer noticed intermittent palpitations with associated lightheadedness.  She tried taking an extra 1/2 metoprolol tablet but did not experience any improvement.  She was already feeling somewhat off last week and saw her PCP for a check of vital signs.  She was told that she may be dehydrated but does not believe that she was in atrial fibrillation at that time.  Today, Ms. Chambless still feels somewhat fatigued but has not had any palpitations today.  She notes a few episodes of palpitations over the last couple of month, though typically not as frequent or long as over the weekend.  She has stable exertional dyspnea.  She denies chest pain and edema.  She has not fallen in at least 6 months.  --------------------------------------------------------------------------------------------------  Past Medical History:  Diagnosis Date   (HFpEF) heart failure with preserved ejection fraction  ([place])    a. 08/2019 Echo: EF 60-65%, no rwma, mild LVH, Gr2 DD, nl RV fxn. Nl pASP. Mildly dil LA. Mild to mod MR.   Anemia    Anxiety    Arthritis    BRCA gene mutation negative 10/2016   NEGATIVE: [facility]   Breast cancer ([place]) 10/16/2016   T1c,N0;, GRADE I/III, 1.6cm. ER/PR pos  HER2 not over expressed, Right Upper Outer   Celiac disease    Cerebellar hemorrhage, acute ([place]) 07/17/2020   Depression    Dyspnea    WITH EXERTION   GERD (gastroesophageal reflux disease)    OCC   Glaucoma    right eye   Headache    H/O MIGRAINES AS TEENAGER   Heart murmur    a. 08/2019 Echo: mild to mod MR.   Hypertension    Hypothyroidism    Non-obstructive CAD (coronary artery disease)    a. 01/2017 MV: Hypertensive response. No ischemia/infarct. EF >65%; b. 09/2019 Cor CTA: Ca2+ = 9.29 (36th percentile). LAD calcified plaque (0-24%), otw nl. Multipel bilat pulm nodules up to 56m.   PAF (paroxysmal atrial fibrillation) ([place]    a. 02/2020 Zio: predominantly RSR, 65 (50-105), rare PACs/PVCs, 8 beats NSVT, multiple episodes of PAF lasting up to 1hr 268ms. Avg AF rate 130 (93-170). AF burden <1%. Triggered events = RSR, PACs, and PAF; b. CHA2DS2VASc = 6.   Pneumonia    YEARS AGO   Pulmonary nodules    a. 09/2019 Cor CTA: incidental finding of multiple bilat pulm nodules; b. 12/2019 High Res CT: stable, scattered solid pulm nodules.   Raynaud's disease    RLS (restless legs syndrome)    [place]  Past Surgical History:  Procedure Laterality Date   ABDOMINAL HYSTERECTOMY     APPENDECTOMY     AXILLARY LYMPH NODE BIOPSY Right 03/05/2017   Procedure: AXILLARY LYMPH NODE BIOPSY;  Surgeon: Robert Bellow, MD;  Location: [facility] ORS;  Service: General;  Laterality: Right;   CATARACT EXTRACTION Bilateral    CESAREAN SECTION     COLONOSCOPY WITH PROPOFOL N/A 05/19/2015   Procedure: COLONOSCOPY WITH PROPOFOL;  Surgeon: Manya Silvas, MD;  [facility] ENDOSCOPY;  Service: Endoscopy;  Laterality:  N/A;   COLONOSCOPY WITH PROPOFOL N/A 08/01/2019   Procedure: COLONOSCOPY WITH PROPOFOL;  Surgeon: Lucilla Lame, MD;  Location: [facility] ENDOSCOPY;  Service: Endoscopy;  Laterality: N/A;   EYE SURGERY     HAND SURGERY Left    LEFT ATRIAL APPENDAGE OCCLUSION N/A 01/27/2021   Procedure: LEFT ATRIAL APPENDAGE OCCLUSION;  Surgeon: Vickie Epley, MD;  Location: [place] [facility] LAB;  Service: Cardiovascular;  Laterality: N/A;   MASTECTOMY Right 11/07/2016   RESIDUAL INVASIVE MAMMARY CARCINOMA, SUBAREOLAR ANTERIOR TO PREVIOUS    SENTINEL NODE BIOPSY Right 11/07/2016   Procedure: SENTINEL NODE BIOPSY;  Surgeon: Robert Bellow, MD;  Location: [facility] ORS;  Service: General;  Laterality: Right;   SIMPLE MASTECTOMY WITH AXILLARY SENTINEL NODE BIOPSY Right 11/07/2016   6 mm ER/PR 100%; Her 2 neu not overexpressed, T1b, N0.  Surgeon: Robert Bellow, MD;  Location: [facility] ORS;  Service: General;  Laterality: Right;   TEE WITHOUT CARDIOVERSION N/A 01/27/2021   Procedure: TRANSESOPHAGEAL ECHOCARDIOGRAM (TEE);  Surgeon: Vickie Epley, MD;  Location: [place] [facility];  Service: Cardiovascular;  Laterality: N/A;   TEE WITHOUT CARDIOVERSION N/A 03/10/2021   Procedure: TRANSESOPHAGEAL ECHOCARDIOGRAM (TEE);  Surgeon: Elouise Munroe, MD;  Location: [place];  Service: Cardiology;  Laterality: N/A;   TONSILLECTOMY  AGE 8   UPPER GI ENDOSCOPY  01/12/06   hiatus hernia    Current Meds  Medication Sig   ALPRAZolam (XANAX) 0.5 MG tablet TAKE 1 TABLET BY MOUTH AT  BEDTIME AS NEEDED FOR SLEEP   amLODipine (NORVASC) 10 MG tablet TAKE 1 TABLET BY MOUTH  DAILY   aspirin EC 81 MG tablet Take 1 tablet (81 mg total) by mouth daily. Swallow whole.   atorvastatin (LIPITOR) 40 MG tablet TAKE 1 TABLET BY MOUTH DAILY   bisacodyl 5 MG EC tablet Take 5 mg by mouth daily as needed for moderate constipation.   Calcium Carb-Cholecalciferol (CALCIUM 600 + D PO) Take 1 tablet by mouth daily.   Cholecalciferol (VITAMIN D3)  125 MCG (5000 UT) TABS Take 5,000 Units by mouth daily.   docusate sodium (COLACE) 100 MG capsule Take 100 mg by mouth daily as needed for mild constipation.   hydroxypropyl methylcellulose / hypromellose (ISOPTO TEARS / GONIOVISC) 2.5 % ophthalmic solution Place 1 drop into both eyes 4 (four) times daily as needed for dry eyes.   latanoprost (XALATAN) 0.005 % ophthalmic solution Place 1 drop into both eyes at bedtime.   levothyroxine (SYNTHROID) 100 MCG tablet TAKE 1 TABLET BY MOUTH 30  MINUTES BEFORE BREAKFAST   metoprolol succinate (TOPROL-XL) 25 MG 24 hr tablet TAKE ONE-HALF TABLET BY  MOUTH DAILY AT BEDTIME   Multiple Vitamin (MULTIVITAMIN WITH MINERALS) TABS tablet Take 1 tablet by mouth daily at 3 pm.   omeprazole (PRILOSEC) 40 MG capsule TAKE 1 CAPSULE BY MOUTH DAILY   polyethylene glycol (MIRALAX / GLYCOLAX) 17 g packet Take 17 g by mouth daily as needed for moderate constipation.  sertraline (ZOLOFT) 100 MG tablet TAKE 1 TABLET BY MOUTH DAILY   timolol (BETIMOL) 0.5 % ophthalmic solution Place 1 drop into both eyes 2 (two) times daily.    Allergies: Patient has no known allergies.  Social History   Tobacco Use   Smoking status: Never   Smokeless tobacco: Never  Vaping Use   Vaping Use: Never used  Substance Use Topics   Alcohol use: Not Currently   Drug use: No    Family History  Problem Relation Age of Onset   Cancer Mother    Anemia Mother    Other Mother        lymphosarcoma   Alcohol abuse Father    Prostate cancer Father    Breast cancer Sister        69; 1/2 sister, shared mother.   Cancer Sister        bile duct   Stroke Maternal Grandmother    Breast cancer Maternal Grandmother 68   Heart attack Maternal Grandfather    CAD Paternal Grandfather     Review of Systems: A 12-system review of systems was performed and was negative except as noted in the  HPI.  --------------------------------------------------------------------------------------------------  Physical Exam: BP 127/67 (BP Location: Left Arm, Patient Position: Sitting, Cuff Size: Normal)   Pulse 64   Ht 5' 3"  (1.6 m)   Wt 133 lb (60.3 kg)   SpO2 96%   BMI 23.56 kg/m   General:  NAD. Neck: No JVD or HJR. Lungs: Clear to auscultation bilaterally without wheezes or crackles. Heart: Regular rate and rhythm with 2/6 systolic murmur. Abdomen: Soft, nontender, nondistended. Extremities: No lower extremity edema.  EKG:  Normal sinus rhythm with LVH and abnormal repolarization.  No significant change from prior tracing on 07/27/2021.  Lab Results  Component Value Date   WBC 4.9 03/22/2022   HGB 10.1 (L) 03/22/2022   HCT 32.5 (L) 03/22/2022   MCV 86.2 03/22/2022   PLT 217 03/22/2022    Lab Results  Component Value Date   NA 141 03/22/2022   K 3.4 (L) 03/22/2022   CL 108 03/22/2022   CO2 26 03/22/2022   BUN 17 03/22/2022   CREATININE 0.65 03/22/2022   GLUCOSE 111 (H) 03/22/2022   ALT 35 03/22/2022    Lab Results  Component Value Date   CHOL 138 06/22/2021   HDL 52 06/22/2021   LDLCALC 63 06/22/2021   TRIG 128 06/22/2021   CHOLHDL 2.7 06/22/2021    --------------------------------------------------------------------------------------------------  ASSESSMENT AND PLAN: Paroxysmal atrial fibrillation: Ms. Kaplan reports a few sporadic palpitations over the last few months consistent with her known PAF, though it became significantly worse this past weekend.  She is in sinus rhythm today.  We will continue current dose of metoprolol succinate.  We discussed repeating an event monitor to confirm PAF as the cause of her palpitations, but have agreed to defer this given her known history of PAF and typical symptoms.  I will refer her back to Dr. [name] for consideration of antiarrhythmic therapy.  She remains off anticoagulation, now over 12 months out from [facility]  implantation in the setting of subdural hematoma associated with recurrent falls.  Fortunately, she has not fallen for at least 6 months.  We will continue aspirin 81 mg daily for now.  I will check BMP, Mg, and TSH today as well as repeat an echo to ensure that a new structural abnormality is not contributing to increasing frequency/severity of PAF.  Chronic HFpEF: Ms. Dam  appears euvolemic.  Other than fatigue and lightheadedness driven primarily by PAF, she has been asymptomatic.  We will repeat an echo, as above, and defer medication changes.  Nonobstructive CAD: No angina reported.  Continue ASA and atorvastatin to prevent progression of mild disease noted on prior coronary CTA.  Chronic ST/T changes on EKG are stable and consistent with LVH with abnormal repolarization.  Follow-up: Return to clinic in 6 months.  Nelva Bush, MD 03/23/2022 11:29 AM

## 2022-03-23 ENCOUNTER — Encounter: Payer: Self-pay | Admitting: Internal Medicine

## 2022-03-23 ENCOUNTER — Telehealth: Payer: Self-pay | Admitting: Internal Medicine

## 2022-03-23 NOTE — Telephone Encounter (Signed)
Pt returning call for lab results

## 2022-03-23 NOTE — Telephone Encounter (Signed)
Sandra Bush, MD  03/23/2022  1:43 PM EDT     Please let [name] know that her labs are notable for moderate anemia, slightly worse than on prior checks.  Her potassium is also slightly low.  I recommend that she begin taking potassium chloride 20 mEq daily and to follow-up with her PCP for further work-up of her anemia.   Left message to call back

## 2022-03-24 ENCOUNTER — Other Ambulatory Visit: Payer: Self-pay | Admitting: Family Medicine

## 2022-03-24 DIAGNOSIS — F419 Anxiety disorder, unspecified: Secondary | ICD-10-CM

## 2022-03-24 MED ORDER — ALPRAZOLAM 0.5 MG PO TABS: 0.5000 mg | ORAL_TABLET | Freq: Every evening | ORAL | 1 refills | Status: DC | PRN

## 2022-03-24 MED ORDER — POTASSIUM CHLORIDE CRYS ER 20 MEQ PO TBCR: 20.0000 meq | EXTENDED_RELEASE_TABLET | Freq: Every day | ORAL | 1 refills | Status: DC

## 2022-03-24 NOTE — Telephone Encounter (Signed)
I spoke with the patient. I have advised her of her lab results and Dr. Darnelle Bos recommendations to:  1) START potassium 20 meq once daily 2) follow up with her PCP for anemia  The patient voices understanding and is agreeable.  She did advise that her PCP has changed from Dr. Rosanna Randy to: [facility] [address] [place], [place] Dr. Farrel Gordon  I will fax her lab results to her PCP.   The patient wanted her potassium RX to be sent to her mail order pharmacy. I have advised her to increase the potassium in her diet until her RX comes (oranges/ bananas/ orange juice,etc.)

## 2022-03-24 NOTE — Telephone Encounter (Signed)
Patient returned RN's call regarding results. 

## 2022-04-01 ENCOUNTER — Other Ambulatory Visit: Payer: Self-pay

## 2022-04-01 ENCOUNTER — Observation Stay
Admission: EM | Admit: 2022-04-01 | Discharge: 2022-04-03 | Disposition: A | Discharge status: Home / Self Care | Payer: Medicare Other | Attending: Internal Medicine | Admitting: Internal Medicine

## 2022-04-01 DIAGNOSIS — E039 Hypothyroidism, unspecified: Secondary | ICD-10-CM | POA: Diagnosis not present

## 2022-04-01 DIAGNOSIS — Z8673 Personal history of transient ischemic attack (TIA), and cerebral infarction without residual deficits: Secondary | ICD-10-CM | POA: Insufficient documentation

## 2022-04-01 DIAGNOSIS — Z79899 Other long term (current) drug therapy: Secondary | ICD-10-CM | POA: Diagnosis not present

## 2022-04-01 DIAGNOSIS — M79601 Pain in right arm: Secondary | ICD-10-CM | POA: Diagnosis present

## 2022-04-01 DIAGNOSIS — I5032 Chronic diastolic (congestive) heart failure: Secondary | ICD-10-CM | POA: Diagnosis not present

## 2022-04-01 DIAGNOSIS — I110 Hypertensive heart disease with heart failure: Secondary | ICD-10-CM | POA: Insufficient documentation

## 2022-04-01 DIAGNOSIS — I2510 Atherosclerotic heart disease of native coronary artery without angina pectoris: Secondary | ICD-10-CM | POA: Insufficient documentation

## 2022-04-01 DIAGNOSIS — S52614A Nondisplaced fracture of right ulna styloid process, initial encounter for closed fracture: Secondary | ICD-10-CM | POA: Diagnosis not present

## 2022-04-01 DIAGNOSIS — Z760 Encounter for issue of repeat prescription: Secondary | ICD-10-CM | POA: Diagnosis not present

## 2022-04-01 DIAGNOSIS — R296 Repeated falls: Secondary | ICD-10-CM

## 2022-04-01 DIAGNOSIS — I480 Paroxysmal atrial fibrillation: Secondary | ICD-10-CM | POA: Diagnosis present

## 2022-04-01 DIAGNOSIS — E785 Hyperlipidemia, unspecified: Secondary | ICD-10-CM | POA: Diagnosis present

## 2022-04-01 DIAGNOSIS — W1839XA Other fall on same level, initial encounter: Secondary | ICD-10-CM | POA: Diagnosis not present

## 2022-04-01 DIAGNOSIS — S52501A Unspecified fracture of the lower end of right radius, initial encounter for closed fracture: Secondary | ICD-10-CM

## 2022-04-01 DIAGNOSIS — K219 Gastro-esophageal reflux disease without esophagitis: Secondary | ICD-10-CM | POA: Diagnosis present

## 2022-04-01 DIAGNOSIS — I619 Nontraumatic intracerebral hemorrhage, unspecified: Secondary | ICD-10-CM | POA: Diagnosis present

## 2022-04-01 DIAGNOSIS — Z7982 Long term (current) use of aspirin: Secondary | ICD-10-CM | POA: Diagnosis not present

## 2022-04-01 DIAGNOSIS — S42301A Unspecified fracture of shaft of humerus, right arm, initial encounter for closed fracture: Secondary | ICD-10-CM | POA: Diagnosis present

## 2022-04-01 DIAGNOSIS — R300 Dysuria: Secondary | ICD-10-CM | POA: Insufficient documentation

## 2022-04-01 DIAGNOSIS — I10 Essential (primary) hypertension: Secondary | ICD-10-CM | POA: Diagnosis present

## 2022-04-01 DIAGNOSIS — E559 Vitamin D deficiency, unspecified: Secondary | ICD-10-CM | POA: Insufficient documentation

## 2022-04-01 DIAGNOSIS — S42401A Unspecified fracture of lower end of right humerus, initial encounter for closed fracture: Secondary | ICD-10-CM | POA: Diagnosis not present

## 2022-04-01 DIAGNOSIS — S92214A Nondisplaced fracture of cuboid bone of right foot, initial encounter for closed fracture: Secondary | ICD-10-CM

## 2022-04-01 DIAGNOSIS — I5033 Acute on chronic diastolic (congestive) heart failure: Secondary | ICD-10-CM | POA: Diagnosis present

## 2022-04-01 DIAGNOSIS — Z853 Personal history of malignant neoplasm of breast: Secondary | ICD-10-CM | POA: Insufficient documentation

## 2022-04-01 NOTE — ED Triage Notes (Signed)
First Nurse Note:  Per ems pt had fall yx and was seen and dx with fracture in R arm and sprain/fracture in R foot/ankle. Pt did not fall again tonight but presents requesting better pain control. States prescribed tramadol is not working well for her. Pt is also requesting a foley d/t urgency.   EMS VS:  HR 67 RR 18 98% on RA 152/74

## 2022-04-02 ENCOUNTER — Observation Stay: Payer: Medicare Other

## 2022-04-02 DIAGNOSIS — S42401A Unspecified fracture of lower end of right humerus, initial encounter for closed fracture: Secondary | ICD-10-CM | POA: Diagnosis not present

## 2022-04-02 DIAGNOSIS — S52501A Unspecified fracture of the lower end of right radius, initial encounter for closed fracture: Secondary | ICD-10-CM

## 2022-04-02 DIAGNOSIS — S42301A Unspecified fracture of shaft of humerus, right arm, initial encounter for closed fracture: Secondary | ICD-10-CM | POA: Diagnosis present

## 2022-04-02 DIAGNOSIS — K219 Gastro-esophageal reflux disease without esophagitis: Secondary | ICD-10-CM

## 2022-04-02 DIAGNOSIS — I5032 Chronic diastolic (congestive) heart failure: Secondary | ICD-10-CM | POA: Diagnosis not present

## 2022-04-02 DIAGNOSIS — R296 Repeated falls: Secondary | ICD-10-CM

## 2022-04-02 DIAGNOSIS — I480 Paroxysmal atrial fibrillation: Secondary | ICD-10-CM | POA: Diagnosis not present

## 2022-04-02 DIAGNOSIS — Z5309 Procedure and treatment not carried out because of other contraindication: Secondary | ICD-10-CM

## 2022-04-02 DIAGNOSIS — I10 Essential (primary) hypertension: Secondary | ICD-10-CM | POA: Diagnosis not present

## 2022-04-02 DIAGNOSIS — S92214A Nondisplaced fracture of cuboid bone of right foot, initial encounter for closed fracture: Secondary | ICD-10-CM

## 2022-04-02 DIAGNOSIS — S52614A Nondisplaced fracture of right ulna styloid process, initial encounter for closed fracture: Secondary | ICD-10-CM

## 2022-04-02 DIAGNOSIS — E039 Hypothyroidism, unspecified: Secondary | ICD-10-CM

## 2022-04-02 LAB — CBC
HCT: 30.8 % — ABNORMAL LOW (ref 36.0–46.0)
Hemoglobin: 9.9 g/dL — ABNORMAL LOW (ref 12.0–15.0)
MCH: 27.1 pg (ref 26.0–34.0)
MCHC: 32.1 g/dL (ref 30.0–36.0)
MCV: 84.4 fL (ref 80.0–100.0)
Platelets: 193 10*3/uL (ref 150–400)
RBC: 3.65 MIL/uL — ABNORMAL LOW (ref 3.87–5.11)
RDW: 14.6 % (ref 11.5–15.5)
WBC: 5.5 10*3/uL (ref 4.0–10.5)
nRBC: 0 % (ref 0.0–0.2)

## 2022-04-02 LAB — COMPREHENSIVE METABOLIC PANEL
ALT: 34 U/L (ref 0–44)
AST: 34 U/L (ref 15–41)
Albumin: 4.1 g/dL (ref 3.5–5.0)
Alkaline Phosphatase: 82 U/L (ref 38–126)
Anion gap: 7 (ref 5–15)
BUN: 12 mg/dL (ref 8–23)
CO2: 25 mmol/L (ref 22–32)
Calcium: 9.2 mg/dL (ref 8.9–10.3)
Chloride: 108 mmol/L (ref 98–111)
Creatinine, Ser: 0.74 mg/dL (ref 0.44–1.00)
GFR, Estimated: >60 mL/min (ref >60)
Glucose, Bld: 111 mg/dL — ABNORMAL HIGH (ref 70–99)
Potassium: 3.3 mmol/L — ABNORMAL LOW (ref 3.5–5.1)
Sodium: 140 mmol/L (ref 135–145)
Total Bilirubin: 1.1 mg/dL (ref 0.3–1.2)
Total Protein: 7.4 g/dL (ref 6.5–8.1)

## 2022-04-02 LAB — CBC WITH DIFFERENTIAL/PLATELET
Abs Immature Granulocytes: 0.02 10*3/uL (ref 0.00–0.07)
Basophils Absolute: 0 10*3/uL (ref 0.0–0.1)
Basophils Relative: 0 %
Eosinophils Absolute: 0 10*3/uL (ref 0.0–0.5)
Eosinophils Relative: 0 %
HCT: 35.3 % — ABNORMAL LOW (ref 36.0–46.0)
Hemoglobin: 11.2 g/dL — ABNORMAL LOW (ref 12.0–15.0)
Immature Granulocytes: 0 %
Lymphocytes Relative: 22 %
Lymphs Abs: 1.6 10*3/uL (ref 0.7–4.0)
MCH: 26.8 pg (ref 26.0–34.0)
MCHC: 31.7 g/dL (ref 30.0–36.0)
MCV: 84.4 fL (ref 80.0–100.0)
Monocytes Absolute: 0.9 10*3/uL (ref 0.1–1.0)
Monocytes Relative: 13 %
Neutro Abs: 4.7 10*3/uL (ref 1.7–7.7)
Neutrophils Relative %: 65 %
Platelets: 233 10*3/uL (ref 150–400)
RBC: 4.18 MIL/uL (ref 3.87–5.11)
RDW: 14.4 % (ref 11.5–15.5)
WBC: 7.2 10*3/uL (ref 4.0–10.5)
nRBC: 0 % (ref 0.0–0.2)

## 2022-04-02 LAB — BASIC METABOLIC PANEL
Anion gap: 5 (ref 5–15)
BUN: 12 mg/dL (ref 8–23)
CO2: 24 mmol/L (ref 22–32)
Calcium: 8.4 mg/dL — ABNORMAL LOW (ref 8.9–10.3)
Chloride: 111 mmol/L (ref 98–111)
Creatinine, Ser: 0.66 mg/dL (ref 0.44–1.00)
GFR, Estimated: >60 mL/min (ref >60)
Glucose, Bld: 104 mg/dL — ABNORMAL HIGH (ref 70–99)
Potassium: 3.5 mmol/L (ref 3.5–5.1)
Sodium: 140 mmol/L (ref 135–145)

## 2022-04-02 LAB — CORTISOL: Cortisol, Plasma: 6.9 ug/dL

## 2022-04-02 LAB — URINALYSIS, COMPLETE (UACMP) WITH MICROSCOPIC
Bilirubin Urine: NEGATIVE
Glucose, UA: NEGATIVE mg/dL
Hgb urine dipstick: NEGATIVE
Ketones, ur: NEGATIVE mg/dL
Leukocytes,Ua: NEGATIVE
Nitrite: NEGATIVE
Protein, ur: NEGATIVE mg/dL
Specific Gravity, Urine: 1.013 (ref 1.005–1.030)
Squamous Epithelial / HPF: NONE SEEN (ref 0–5)
pH: 6 (ref 5.0–8.0)

## 2022-04-02 LAB — VITAMIN D 25 HYDROXY (VIT D DEFICIENCY, FRACTURES): Vit D, 25-Hydroxy: 72.65 ng/mL (ref 30–100)

## 2022-04-02 LAB — FOLATE: Folate: 36 ng/mL (ref >5.9)

## 2022-04-02 LAB — VITAMIN B12: Vitamin B-12: 293 pg/mL (ref 180–914)

## 2022-04-02 MED ORDER — MORPHINE SULFATE (PF) 4 MG/ML IV SOLN
4.0000 mg | Freq: Once | INTRAVENOUS | Status: AC
  Administered 2022-04-02: 4 mg via INTRAVENOUS
  Filled 2022-04-02: qty 1

## 2022-04-02 MED ORDER — ASPIRIN 81 MG PO TBEC
81.0000 mg | DELAYED_RELEASE_TABLET | Freq: Every day | ORAL | Status: DC
  Administered 2022-04-02 – 2022-04-03 (×2): 81 mg via ORAL
  Filled 2022-04-02 (×2): qty 1

## 2022-04-02 MED ORDER — TRAMADOL HCL 50 MG PO TABS
50.0000 mg | ORAL_TABLET | Freq: Four times a day (QID) | ORAL | Status: DC | PRN
  Administered 2022-04-02 – 2022-04-03 (×4): 50 mg via ORAL
  Filled 2022-04-02 (×4): qty 1

## 2022-04-02 MED ORDER — LEVOTHYROXINE SODIUM 50 MCG PO TABS
100.0000 ug | ORAL_TABLET | Freq: Every day | ORAL | Status: DC
  Administered 2022-04-02 – 2022-04-03 (×2): 100 ug via ORAL
  Filled 2022-04-02 (×2): qty 2

## 2022-04-02 MED ORDER — ATORVASTATIN CALCIUM 20 MG PO TABS
40.0000 mg | ORAL_TABLET | Freq: Every day | ORAL | Status: DC
  Administered 2022-04-02 – 2022-04-03 (×2): 40 mg via ORAL
  Filled 2022-04-02 (×2): qty 2

## 2022-04-02 MED ORDER — POTASSIUM CHLORIDE IN NACL 20-0.9 MEQ/L-% IV SOLN
INTRAVENOUS | Status: DC
  Filled 2022-04-02 (×5): qty 1000

## 2022-04-02 MED ORDER — LATANOPROST 0.005 % OP SOLN
1.0000 [drp] | Freq: Every day | OPHTHALMIC | Status: DC
  Administered 2022-04-02: 1 [drp] via OPHTHALMIC
  Filled 2022-04-02: qty 2.5

## 2022-04-02 MED ORDER — ONDANSETRON HCL 4 MG/2ML IJ SOLN: 4.0000 mg | Freq: Four times a day (QID) | INTRAMUSCULAR | Status: DC | PRN

## 2022-04-02 MED ORDER — ADULT MULTIVITAMIN W/MINERALS CH
1.0000 | ORAL_TABLET | Freq: Every day | ORAL | Status: DC
  Administered 2022-04-02 – 2022-04-03 (×2): 1 via ORAL
  Filled 2022-04-02 (×2): qty 1

## 2022-04-02 MED ORDER — MAGNESIUM HYDROXIDE 400 MG/5ML PO SUSP: 30.0000 mL | Freq: Every day | ORAL | Status: DC | PRN

## 2022-04-02 MED ORDER — AMLODIPINE BESYLATE 5 MG PO TABS
10.0000 mg | ORAL_TABLET | Freq: Every day | ORAL | Status: DC
  Administered 2022-04-02 – 2022-04-03 (×2): 10 mg via ORAL
  Filled 2022-04-02 (×2): qty 2

## 2022-04-02 MED ORDER — ALPRAZOLAM 0.5 MG PO TABS
0.5000 mg | ORAL_TABLET | Freq: Every evening | ORAL | Status: DC | PRN
  Administered 2022-04-02 (×2): 0.5 mg via ORAL
  Filled 2022-04-02 (×2): qty 1

## 2022-04-02 MED ORDER — ACETAMINOPHEN 650 MG RE SUPP: 650.0000 mg | Freq: Four times a day (QID) | RECTAL | Status: DC | PRN

## 2022-04-02 MED ORDER — BISACODYL 5 MG PO TBEC: 5.0000 mg | DELAYED_RELEASE_TABLET | Freq: Every day | ORAL | Status: DC | PRN

## 2022-04-02 MED ORDER — ACETAMINOPHEN 325 MG PO TABS: 650.0000 mg | ORAL_TABLET | Freq: Four times a day (QID) | ORAL | Status: DC | PRN

## 2022-04-02 MED ORDER — TRAZODONE HCL 50 MG PO TABS: 25.0000 mg | ORAL_TABLET | Freq: Every evening | ORAL | Status: DC | PRN

## 2022-04-02 MED ORDER — ACETAMINOPHEN 500 MG PO TABS
1000.0000 mg | ORAL_TABLET | Freq: Three times a day (TID) | ORAL | Status: DC
  Administered 2022-04-02 – 2022-04-03 (×4): 1000 mg via ORAL
  Filled 2022-04-02 (×4): qty 2

## 2022-04-02 MED ORDER — POTASSIUM CHLORIDE CRYS ER 20 MEQ PO TBCR
20.0000 meq | EXTENDED_RELEASE_TABLET | Freq: Every day | ORAL | Status: DC
  Administered 2022-04-02 – 2022-04-03 (×2): 20 meq via ORAL
  Filled 2022-04-02 (×2): qty 1

## 2022-04-02 MED ORDER — POLYVINYL ALCOHOL 1.4 % OP SOLN: 1.0000 [drp] | Freq: Four times a day (QID) | OPHTHALMIC | Status: DC | PRN

## 2022-04-02 MED ORDER — POLYETHYLENE GLYCOL 3350 17 G PO PACK
17.0000 g | PACK | Freq: Every day | ORAL | Status: DC | PRN
  Administered 2022-04-03: 17 g via ORAL
  Filled 2022-04-02: qty 1

## 2022-04-02 MED ORDER — SERTRALINE HCL 50 MG PO TABS
100.0000 mg | ORAL_TABLET | Freq: Every day | ORAL | Status: DC
  Administered 2022-04-02 – 2022-04-03 (×2): 100 mg via ORAL
  Filled 2022-04-02 (×2): qty 2

## 2022-04-02 MED ORDER — TIMOLOL MALEATE 0.5 % OP SOLN
1.0000 [drp] | Freq: Two times a day (BID) | OPHTHALMIC | Status: DC
  Administered 2022-04-02 – 2022-04-03 (×3): 1 [drp] via OPHTHALMIC
  Filled 2022-04-02: qty 5

## 2022-04-02 MED ORDER — MORPHINE SULFATE (PF) 2 MG/ML IV SOLN
2.0000 mg | INTRAVENOUS | Status: DC | PRN
  Administered 2022-04-02 (×2): 2 mg via INTRAVENOUS
  Filled 2022-04-02 (×2): qty 1

## 2022-04-02 MED ORDER — DOCUSATE SODIUM 100 MG PO CAPS
100.0000 mg | ORAL_CAPSULE | Freq: Every day | ORAL | Status: DC | PRN
  Administered 2022-04-02: 100 mg via ORAL
  Filled 2022-04-02: qty 1

## 2022-04-02 MED ORDER — URELLE 81 MG PO TABS
1.0000 | ORAL_TABLET | Freq: Three times a day (TID) | ORAL | Status: DC
  Administered 2022-04-02 – 2022-04-03 (×4): 81 mg via ORAL
  Filled 2022-04-02 (×5): qty 1

## 2022-04-02 MED ORDER — VITAMIN D 25 MCG (1000 UNIT) PO TABS
5000.0000 [IU] | ORAL_TABLET | Freq: Every day | ORAL | Status: DC
  Administered 2022-04-02 – 2022-04-03 (×2): 5000 [IU] via ORAL
  Filled 2022-04-02 (×2): qty 5

## 2022-04-02 MED ORDER — OYSTER SHELL CALCIUM/D3 500-5 MG-MCG PO TABS
ORAL_TABLET | Freq: Every day | ORAL | Status: DC
  Administered 2022-04-02: 1 via ORAL
  Filled 2022-04-02 (×2): qty 1

## 2022-04-02 MED ORDER — ENOXAPARIN SODIUM 40 MG/0.4ML IJ SOSY
40.0000 mg | PREFILLED_SYRINGE | INTRAMUSCULAR | Status: DC
  Administered 2022-04-02 – 2022-04-03 (×2): 40 mg via SUBCUTANEOUS
  Filled 2022-04-02 (×3): qty 0.4

## 2022-04-02 MED ORDER — PANTOPRAZOLE SODIUM 40 MG PO TBEC
40.0000 mg | DELAYED_RELEASE_TABLET | Freq: Every day | ORAL | Status: DC
  Administered 2022-04-02 – 2022-04-03 (×2): 40 mg via ORAL
  Filled 2022-04-02 (×2): qty 1

## 2022-04-02 MED ORDER — METOPROLOL SUCCINATE ER 50 MG PO TB24
25.0000 mg | ORAL_TABLET | Freq: Every day | ORAL | Status: DC
  Administered 2022-04-02 – 2022-04-03 (×2): 25 mg via ORAL
  Filled 2022-04-02 (×2): qty 1

## 2022-04-02 MED ORDER — ONDANSETRON HCL 4 MG PO TABS: 4.0000 mg | ORAL_TABLET | Freq: Four times a day (QID) | ORAL | Status: DC | PRN

## 2022-04-02 MED ORDER — KETOROLAC TROMETHAMINE 30 MG/ML IJ SOLN
15.0000 mg | Freq: Once | INTRAMUSCULAR | Status: AC
  Administered 2022-04-02: 15 mg via INTRAVENOUS
  Filled 2022-04-02: qty 1

## 2022-04-02 NOTE — Assessment & Plan Note (Signed)
-   We will continue statin therapy. 

## 2022-04-02 NOTE — Assessment & Plan Note (Signed)
-   We will continue PPI therapy 

## 2022-04-02 NOTE — Assessment & Plan Note (Signed)
-   We will continue Synthroid. 

## 2022-04-02 NOTE — ED Notes (Signed)
Report given to [employer]. RN ready to receive pt

## 2022-04-02 NOTE — Assessment & Plan Note (Signed)
-   The patient be admitted to a medical observation bed - Pain management to be provided. - Orthopedic consult will be obtained. - I notified Dr. Karel Jarvis about the patient

## 2022-04-02 NOTE — Assessment & Plan Note (Signed)
-   We will continue her antihypertensives.

## 2022-04-02 NOTE — Assessment & Plan Note (Signed)
-   Physical therapy consult to be obtained. She can be assessed for rehabilitation.

## 2022-04-02 NOTE — H&P (Addendum)
[place]   PATIENT NAME: Sandra [name]    MR#:  [id number]  DATE OF BIRTH:  May 29, 1946  DATE OF ADMISSION:  04/01/2022  PRIMARY CARE PHYSICIAN: [name], [name], MD   Patient is coming from: Home  REQUESTING/REFERRING PHYSICIAN: Valora Piccolo, MD  CHIEF COMPLAINT:   Chief Complaint  Patient presents with   Medication Refill   Arm Pain    HISTORY OF PRESENT ILLNESS:  [name] is a 76 y.o. Caucasian female with medical history significant for multiple medical problems that are mentioned below, who presented to the emergency room with acute onset of fall on Friday with subsequent right arm displaced fracture involving the radius and ulna  as well as right ankle sprain with difficulty putting weight on it.  She was given Tylenol and tramadol for pain.  She had another minor fall on Saturday.  She stated that she has been losing her balance.  She denies any presyncope or syncope.  Dizziness.  No paresthesias or focal muscle weakness.  She stated that this has been recurrent for the last couple of years since she had her intracranial hemorrhage.  She admits to dyspnea without cough or wheezing.  No chest pain or palpitations.  She has baseline urinary incontinence without dysuria or hematuria or flank pain.  No fever or chills.  ED Course: When she came to the ER vital signs were within normal.  Labs revealed  hypokalemia with potassium of 3.3 with otherwise unremarkable CMP.  CBC showed mild anemia better than previous levels.  The patient was given 50 mg of IV Toradol and 4 mg of IV morphine sulfate.  She will be admitted to an observation medical bed for further evaluation and management. PAST MEDICAL HISTORY:   Past Medical History:  Diagnosis Date   (HFpEF) heart failure with preserved ejection fraction ([place])    a. 08/2019 Echo: EF 60-65%, no rwma, mild LVH, Gr2 DD, nl RV fxn. Nl pASP. Mildly dil LA. Mild to mod MR.   Anemia    Anxiety    Arthritis    BRCA  gene mutation negative 10/2016   NEGATIVE: [employer]   Breast cancer ([place]) 10/16/2016   T1c,N0;, GRADE I/III, 1.6cm. ER/PR pos  HER2 not over expressed, Right Upper Outer   Celiac disease    Cerebellar hemorrhage, acute ([place]) 07/17/2020   Depression    Dyspnea    WITH EXERTION   GERD (gastroesophageal reflux disease)    OCC   Glaucoma    right eye   Headache    H/O MIGRAINES AS TEENAGER   Heart murmur    a. 08/2019 Echo: mild to mod MR.   Hypertension    Hypothyroidism    Non-obstructive CAD (coronary artery disease)    a. 01/2017 MV: Hypertensive response. No ischemia/infarct. EF >65%; b. 09/2019 Cor CTA: Ca2+ = 9.29 (36th percentile). LAD calcified plaque (0-24%), otw nl. Multipel bilat pulm nodules up to 20m.   [place]ox[place]    a. 02/2020 [place]: predominantly RSR, 65 (50-105), rare PACs/PVCs, 8 beats NSVT, multiple episodes of PAF lasting up to 1hr 267ms. Avg AF rate 130 (93-170). AF burden <1%. Triggered events = RSR, PACs, and PAF; b. CHA2DS2VASc = 6.   Pneumonia    YEARS AGO   Pulmonary nodules    a. 09/2019 Cor CTA: incidental finding of multiple bilat pulm nodules; b. 12/2019 High Res CT: stable, scattered solid pulm nodules.   Raynaud's disease  RLS (restless legs syndrome)    Stroke [facility])     PAST SURGICAL HISTORY:   Past Surgical History:  Procedure Laterality Date   ABDOMINAL HYSTERECTOMY     APPENDECTOMY     AXILLARY LYMPH NODE BIOPSY Right 03/05/2017   Procedure: AXILLARY LYMPH NODE BIOPSY;  Surgeon: Robert Bellow, MD;  Location: [facility] ORS;  Service: General;  Laterality: Right;   CATARACT EXTRACTION Bilateral    CESAREAN SECTION     COLONOSCOPY WITH PROPOFOL N/A 05/19/2015   Procedure: COLONOSCOPY WITH PROPOFOL;  Surgeon: Manya Silvas, MD;  Location: [facility] ENDOSCOPY;  Service: Endoscopy;  Laterality: N/A;   COLONOSCOPY WITH PROPOFOL N/A 08/01/2019   Procedure: COLONOSCOPY WITH PROPOFOL;  Surgeon: Lucilla Lame, MD;  Location: [facility]  ENDOSCOPY;  Service: Endoscopy;  Laterality: N/A;   EYE SURGERY     HAND SURGERY Left    LEFT ATRIAL APPENDAGE OCCLUSION N/A 01/27/2021   Procedure: LEFT ATRIAL APPENDAGE OCCLUSION;  Surgeon: Vickie Epley, MD;  Location: [place] CV LAB;  Service: Cardiovascular;  Laterality: N/A;   MASTECTOMY Right 11/07/2016   RESIDUAL INVASIVE MAMMARY CARCINOMA, SUBAREOLAR ANTERIOR TO PREVIOUS    SENTINEL NODE BIOPSY Right 11/07/2016   Procedure: SENTINEL NODE BIOPSY;  Surgeon: Robert Bellow, MD;  Location: [facility] ORS;  Service: General;  Laterality: Right;   SIMPLE MASTECTOMY WITH AXILLARY SENTINEL NODE BIOPSY Right 11/07/2016   6 mm ER/PR 100%; Her 2 neu not overexpressed, T1b, N0.  Surgeon: Robert Bellow, MD;  Location: [facility] ORS;  Service: General;  Laterality: Right;   TEE WITHOUT CARDIOVERSION N/A 01/27/2021   Procedure: TRANSESOPHAGEAL ECHOCARDIOGRAM (TEE);  Surgeon: Vickie Epley, MD;  Location: [place] CV LAB;  Service: Cardiovascular;  Laterality: N/A;   TEE WITHOUT CARDIOVERSION N/A 03/10/2021   Procedure: TRANSESOPHAGEAL ECHOCARDIOGRAM (TEE);  Surgeon: Elouise Munroe, MD;  Location: [place];  Service: Cardiology;  Laterality: N/A;   TONSILLECTOMY  AGE 84   UPPER GI ENDOSCOPY  01/12/06   hiatus hernia    SOCIAL HISTORY:   Social History   Tobacco Use   Smoking status: Never   Smokeless tobacco: Never  Substance Use Topics   Alcohol use: Not Currently    FAMILY HISTORY:   Family History  Problem Relation Age of Onset   Cancer Mother    Anemia Mother    Other Mother        lymphosarcoma   Alcohol abuse Father    Prostate cancer Father    Breast cancer Sister        49; 1/2 sister, shared mother.   Cancer Sister        bile duct   Stroke Maternal Grandmother    Breast cancer Maternal Grandmother 68   Heart attack Maternal Grandfather    CAD Paternal Grandfather     DRUG ALLERGIES:  No Known Allergies  REVIEW OF SYSTEMS:   ROS As per  history of present illness. All pertinent systems were reviewed above. Constitutional, HEENT, cardiovascular, respiratory, GI, GU, musculoskeletal, neuro, psychiatric, endocrine, integumentary and hematologic systems were reviewed and are otherwise negative/unremarkable except for positive findings mentioned above in the HPI.   MEDICATIONS AT HOME:   Prior to Admission medications   Medication Sig Start Date End Date Taking? Authorizing Provider  amLODipine (NORVASC) 10 MG tablet TAKE 1 TABLET BY MOUTH  DAILY 07/12/21  Yes Jerrol Banana., MD  aspirin EC 81 MG tablet Take 1 tablet (81 mg total) by mouth daily. Swallow whole.  07/27/21  Yes Vickie Epley, MD  atorvastatin (LIPITOR) 40 MG tablet TAKE 1 TABLET BY MOUTH DAILY 09/02/21  Yes Dunn, Areta Haber, PA-C  Calcium Carb-Cholecalciferol (CALCIUM 600 + D PO) Take 1 tablet by mouth daily.   Yes [provider]  Cholecalciferol (VITAMIN D3) 125 MCG (5000 UT) TABS Take 5,000 Units by mouth daily.   Yes [provider]  latanoprost (XALATAN) 0.005 % ophthalmic solution Place 1 drop into both eyes at bedtime.   Yes [provider]  levothyroxine (SYNTHROID) 100 MCG tablet TAKE 1 TABLET BY MOUTH 30  MINUTES BEFORE BREAKFAST 02/02/22  Yes Jerrol Banana., MD  metoprolol succinate (TOPROL-XL) 25 MG 24 hr tablet TAKE ONE-HALF TABLET BY  MOUTH DAILY AT BEDTIME 11/15/21  Yes Vickie Epley, MD  Multiple Vitamin (MULTIVITAMIN WITH MINERALS) TABS tablet Take 1 tablet by mouth daily at 3 pm.   Yes [provider]  omeprazole (PRILOSEC) 40 MG capsule TAKE 1 CAPSULE BY MOUTH DAILY 02/02/22  Yes Jerrol Banana., MD  potassium chloride SA (KLOR-CON M) 20 MEQ tablet Take 1 tablet (20 mEq total) by mouth daily. 03/24/22  Yes End, Harrell Gave, MD  sertraline (ZOLOFT) 100 MG tablet TAKE 1 TABLET BY MOUTH DAILY 12/05/21  Yes Jerrol Banana., MD  timolol (BETIMOL) 0.5 % ophthalmic solution Place 1 drop into both  eyes 2 (two) times daily.   Yes [provider]  traMADol (ULTRAM) 50 MG tablet Take 1 tablet by mouth every 8 (eight) hours as needed. 03/31/22 04/05/22 Yes [provider]  ALPRAZolam Duanne Moron) 0.5 MG tablet Take 1 tablet (0.5 mg total) by mouth at bedtime as needed. for sleep 03/24/22   Birdie Sons, MD  bisacodyl 5 MG EC tablet Take 5 mg by mouth daily as needed for moderate constipation.    [provider]  docusate sodium (COLACE) 100 MG capsule Take 100 mg by mouth daily as needed for mild constipation.    [provider]  hydroxypropyl methylcellulose / hypromellose (ISOPTO TEARS / GONIOVISC) 2.5 % ophthalmic solution Place 1 drop into both eyes 4 (four) times daily as needed for dry eyes.    [provider]  polyethylene glycol (MIRALAX / GLYCOLAX) 17 g packet Take 17 g by mouth daily as needed for moderate constipation.    [provider]      VITAL SIGNS:  Blood pressure (!) 118/55, pulse 63, temperature 98.5 F (36.9 C), resp. rate 17, height _0  (1.6 m), weight 60.3 kg, SpO2 98 %.  PHYSICAL EXAMINATION:  Physical Exam  GENERAL:  76 y.o.-year-old Caucasian female patient lying in the bed with no acute distress.  EYES: Pupils equal, round, reactive to light and accommodation. No scleral icterus. Extraocular muscles intact.  HEENT: Head atraumatic, normocephalic. Oropharynx and nasopharynx clear.  NECK:  Supple, no jugular venous distention. No thyroid enlargement, no tenderness.  LUNGS: Normal breath sounds bilaterally, no wheezing, rales,rhonchi or crepitation. No use of accessory muscles of respiration.  CARDIOVASCULAR: Regular rate and rhythm, S1, S2 normal. No murmurs, rubs, or gallops.  ABDOMEN: Soft, nondistended, nontender. Bowel sounds present. No organomegaly or mass.  EXTREMITIES: No pedal edema, cyanosis, or clubbing.  Musculoskeletal : right arm in sling and right ankle swollen with tenderness and decreased range  of motion secondary to pain. NEUROLOGIC: Cranial nerves II through XII are intact. Muscle strength 5/5 in all extremities. Sensation intact. Gait not checked.  PSYCHIATRIC: The patient is alert and oriented x  3.  Normal affect and good eye contact. SKIN: No obvious rash, lesion, or ulcer.   LABORATORY PANEL:   CBC Recent Labs  Lab 04/02/22 0057  WBC 7.2  HGB 11.2*  HCT 35.3*  PLT 233   ------------------------------------------------------------------------------------------------------------------  Chemistries  Recent Labs  Lab 04/02/22 0057  NA 140  K 3.3*  CL 108  CO2 25  GLUCOSE 111*  BUN 12  CREATININE 0.74  CALCIUM 9.2  AST 34  ALT 34  ALKPHOS 82  BILITOT 1.1   ------------------------------------------------------------------------------------------------------------------  Cardiac Enzymes No results for input(s): "TROPONINI" in the last 168 hours. ------------------------------------------------------------------------------------------------------------------  RADIOLOGY:  No results found.    IMPRESSION AND PLAN:  Assessment and Plan: Right arm fracture - The patient be admitted to a medical observation bed - Pain management to be provided. - Orthopedic consult will be obtained. - I notified Dr. Karel Jarvis about the patient  Recurrent falls - Physical therapy consult to be obtained. She can be assessed for rehabilitation.  Essential hypertension - We will continue her antihypertensives.  GERD (gastroesophageal reflux disease) We will continue PPI therapy.  Hyperlipidemia LDL goal <70 - We will continue statin therapy.  Hypothyroidism We will continue Synthroid     DVT prophylaxis: Lovenox.  Advanced Care Planning:  Code Status: full code.  Family Communication:  The plan of care was discussed in details with the patient (and family). I answered all questions. The patient agreed to proceed with the above mentioned plan. Further management  will depend upon hospital course. Disposition Plan: Back to previous home environment Consults called: [facility] clinic orthopedic consult All the records are reviewed and case discussed with ED provider.  Status is: Observation  I certify that at the time of admission, it is my clinical judgment that the patient will require  hospital care extending less than 2 midnights.                            Dispo: The patient is from: Home              Anticipated d/c is to: Home              Patient currently is not medically stable to d/c.              Difficult to place patient: No  Christel Mormon M.D on 04/02/2022 at 3:02 AM  [facility]   From 7 PM-7 AM, contact night-coverage [email or web address]  CC: Primary care physician; [name], [name], MD

## 2022-04-02 NOTE — Consult Note (Signed)
ORTHOPAEDIC CONSULTATION  REQUESTING PHYSICIAN: [name], MD  Chief Complaint:   Right wrist and right ankle injury  History of Present Illness: Sandra [name] is a 76 y.o. female who had a mechanical fall after a trip 2 days ago and was seen in the [facility] urgent care and diagnosed with a right distal radius and ulna fracture and a right ankle sprain.  She had another fall yesterday and states she has had a long-term issue with balance leading to multiple falls.  Her right wrist and arm are in a sugar-tong splint she denies any numbness or tingling in her fingers.  She reports swelling and pain with weightbearing in her right foot and lateral ankle.  She denies any chest pain, shortness of breath, palpitations, syncope, or focal weakness.   Past Medical History:  Diagnosis Date   (HFpEF) heart failure with preserved ejection fraction ([place])    a. 08/2019 Echo: EF 60-65%, no rwma, mild LVH, Gr2 DD, nl RV fxn. Nl pASP. Mildly dil LA. Mild to mod MR.   Anemia    Anxiety    Arthritis    BRCA gene mutation negative 10/2016   NEGATIVE: [employer]   Breast cancer ([place]) 10/16/2016   T1c,N0;, GRADE I/III, 1.6cm. ER/PR pos  HER2 not over expressed, Right Upper Outer   Celiac disease    Cerebellar hemorrhage, acute ([place]) 07/17/2020   Depression    Dyspnea    WITH EXERTION   GERD (gastroesophageal reflux disease)    OCC   Glaucoma    right eye   Headache    H/O MIGRAINES AS TEENAGER   Heart murmur    a. 08/2019 Echo: mild to mod MR.   Hypertension    Hypothyroidism    Non-obstructive CAD (coronary artery disease)    a. 01/2017 MV: Hypertensive response. No ischemia/infarct. EF >65%; b. 09/2019 Cor CTA: Ca2+ = 9.29 (36th percentile). LAD calcified plaque (0-24%), otw nl. Multipel bilat pulm nodules up to 57m.   [place]ox[place]mal [place]    a. 02/2020 [facility]: predominantly RSR, 65 (50-105), rare PACs/PVCs, 8  beats NSVT, multiple episodes of PAF lasting up to 1hr 227ms. Avg AF rate 130 (93-170). AF burden <1%. Triggered events = RSR, PACs, and PAF; b. CHA2DS2VASc = 6.   Pneumonia    YEARS AGO   Pulmonary nodules    a. 09/2019 Cor CTA: incidental finding of multiple bilat pulm nodules; b. 12/2019 High Res CT: stable, scattered solid pulm nodules.   Raynaud's disease    RLS (restless legs syndrome)    [facility]   Past Surgical History:  Procedure Laterality Date   ABDOMINAL HYSTERECTOMY     APPENDECTOMY     AXILLARY LYMPH NODE BIOPSY Right 03/05/2017   Procedure: AXILLARY LYMPH NODE BIOPSY;  Surgeon: ByRobert BellowMD;  Location: [facility] OR[facility];  Service: General;  Laterality: Right;   CATARACT EXTRACTION Bilateral    CESAREAN SECTION     COLONOSCOPY WITH PROPOFOL N/A 05/19/2015   Procedure: COLONOSCOPY WITH PROPOFOL;  Surgeon: RoManya SilvasMD;  [facility];  Service: Endoscopy;  Laterality: N/A;   COLONOSCOPY WITH PROPOFOL N/A 08/01/2019   Procedure: COLONOSCOPY WITH PROPOFOL;  Surgeon: WoLucilla LameMD;  [facility];  Service: Endoscopy;  Laterality: N/A;   EYE SURGERY     HAND SURGERY Left    LEFT ATRIAL APPENDAGE OCCLUSION N/A 01/27/2021   Procedure: LEFT ATRIAL APPENDAGE OCCLUSION;  Surgeon: LaVickie EpleyMD;  Location:  [place] INVASIVE CV LAB;  Service: Cardiovascular;  Laterality: N/A;   MASTECTOMY Right 11/07/2016   RESIDUAL INVASIVE MAMMARY CARCINOMA, SUBAREOLAR ANTERIOR TO PREVIOUS    SENTINEL NODE BIOPSY Right 11/07/2016   Procedure: SENTINEL NODE BIOPSY;  Surgeon: Robert Bellow, MD;  Location: [facility] ORS;  Service: General;  Laterality: Right;   SIMPLE MASTECTOMY WITH AXILLARY SENTINEL NODE BIOPSY Right 11/07/2016   6 mm ER/PR 100%; Her 2 neu not overexpressed, T1b, N0.  Surgeon: Robert Bellow, MD;  Location: [facility];  Service: General;  Laterality: Right;   TEE WITHOUT CARDIOVERSION N/A 01/27/2021   Procedure: TRANSESOPHAGEAL ECHOCARDIOGRAM  (TEE);  Surgeon: Vickie Epley, MD;  Location: [place] CV LAB;  Service: Cardiovascular;  Laterality: N/A;   TEE WITHOUT CARDIOVERSION N/A 03/10/2021   Procedure: TRANSESOPHAGEAL ECHOCARDIOGRAM (TEE);  Surgeon: Elouise Munroe, MD;  Location: [place];  Service: Cardiology;  Laterality: N/A;   TONSILLECTOMY  AGE 57   UPPER GI ENDOSCOPY  01/12/06   hiatus hernia   Social History   Socioeconomic History   Marital status: Married    Spouse name: Nadara Mustard   Number of children: 2   Years of education: Not on file   Highest education level: Some college, no degree  Occupational History   Occupation: retired  Tobacco Use   Smoking status: Never   Smokeless tobacco: Never  Vaping Use   Vaping Use: Never used  Substance and Sexual Activity   Alcohol use: Not Currently   Drug use: No   Sexual activity: Not Currently    Birth control/protection: None  Other Topics Concern   Not on file  Social History Narrative   Lives with Husband   Right handed   Drinks 2-3 cups caffiene daily   Social Determinants of Health   Financial Resource Strain: Low Risk  (05/18/2021)   Overall Financial Resource Strain (CARDIA)    Difficulty of Paying Living Expenses: Not hard at all  Food Insecurity: No Food Insecurity (04/02/2022)   Hunger Vital Sign    Worried About Running Out of Food in the Last Year: Never true    [place] in the Last Year: Never true  Transportation Needs: No Transportation Needs (04/02/2022)   PRAPARE - Hydrologist (Medical): No    Lack of Transportation (Non-Medical): No  Physical Activity: Insufficiently Active (05/18/2021)   Exercise Vital Sign    Days of Exercise per Week: 3 days    Minutes of Exercise per Session: 30 min  Stress: No Stress Concern Present (05/18/2021)   [place]    Feeling of Stress : Only a little  Social Connections: Socially  Integrated (05/18/2021)   Social Connection and Isolation Panel [NHANES]    Frequency of Communication with Friends and Family: More than three times a week    Frequency of Social Gatherings with Friends and Family: More than three times a week    Attends Religious Services: More than 4 times per year    Active Member of [employer] or Organizations: Yes    Attends Music therapist: More than 4 times per year    Marital Status: Married   Family History  Problem Relation Age of Onset   Cancer Mother    Anemia Mother    Other Mother        lymphosarcoma   Alcohol abuse Father    Prostate cancer Father    Breast cancer  Sister        31; 1/2 sister, shared mother.   Cancer Sister        bile duct   Stroke Maternal Grandmother    Breast cancer Maternal Grandmother 68   Heart attack Maternal Grandfather    CAD Paternal Grandfather    No Known Allergies Prior to Admission medications   Medication Sig Start Date End Date Taking? Authorizing Provider  amLODipine (NORVASC) 10 MG tablet TAKE 1 TABLET BY MOUTH  DAILY 07/12/21  Yes Jerrol Banana., MD  aspirin EC 81 MG tablet Take 1 tablet (81 mg total) by mouth daily. Swallow whole. 07/27/21  Yes Vickie Epley, MD  atorvastatin (LIPITOR) 40 MG tablet TAKE 1 TABLET BY MOUTH DAILY 09/02/21  Yes Dunn, Areta Haber, PA-C  Calcium Carb-Cholecalciferol (CALCIUM 600 + D PO) Take 1 tablet by mouth daily.   Yes [provider]  Cholecalciferol (VITAMIN D3) 125 MCG (5000 UT) TABS Take 5,000 Units by mouth daily.   Yes [provider]  latanoprost (XALATAN) 0.005 % ophthalmic solution Place 1 drop into both eyes at bedtime.   Yes [provider]  levothyroxine (SYNTHROID) 100 MCG tablet TAKE 1 TABLET BY MOUTH 30  MINUTES BEFORE BREAKFAST 02/02/22  Yes Jerrol Banana., MD  metoprolol succinate (TOPROL-XL) 25 MG 24 hr tablet TAKE ONE-HALF TABLET BY  MOUTH DAILY AT BEDTIME 11/15/21  Yes Vickie Epley, MD   Multiple Vitamin (MULTIVITAMIN WITH MINERALS) TABS tablet Take 1 tablet by mouth daily at 3 pm.   Yes [provider]  omeprazole (PRILOSEC) 40 MG capsule TAKE 1 CAPSULE BY MOUTH DAILY 02/02/22  Yes Jerrol Banana., MD  potassium chloride SA (KLOR-CON M) 20 MEQ tablet Take 1 tablet (20 mEq total) by mouth daily. 03/24/22  Yes End, Harrell Gave, MD  sertraline (ZOLOFT) 100 MG tablet TAKE 1 TABLET BY MOUTH DAILY 12/05/21  Yes Jerrol Banana., MD  timolol (BETIMOL) 0.5 % ophthalmic solution Place 1 drop into both eyes 2 (two) times daily.   Yes [provider]  traMADol (ULTRAM) 50 MG tablet Take 1 tablet by mouth every 8 (eight) hours as needed. 03/31/22 04/05/22 Yes [provider]  ALPRAZolam Duanne Moron) 0.5 MG tablet Take 1 tablet (0.5 mg total) by mouth at bedtime as needed. for sleep 03/24/22   Birdie Sons, MD  bisacodyl 5 MG EC tablet Take 5 mg by mouth daily as needed for moderate constipation.    [provider]  docusate sodium (COLACE) 100 MG capsule Take 100 mg by mouth daily as needed for mild constipation.    [provider]  hydroxypropyl methylcellulose / hypromellose (ISOPTO TEARS / GONIOVISC) 2.5 % ophthalmic solution Place 1 drop into both eyes 4 (four) times daily as needed for dry eyes.    [provider]  polyethylene glycol (MIRALAX / GLYCOLAX) 17 g packet Take 17 g by mouth daily as needed for moderate constipation.    [provider]   No results found.  Positive ROS: All other systems have been reviewed and were otherwise negative with the exception of those mentioned in the HPI and as above.  Physical Exam: General:  Alert, no acute distress Psychiatric:  Patient is competent for consent with normal mood and affect   Cardiovascular:  No pedal edema Respiratory:  No wheezing, non-labored breathing GI:  Abdomen is soft and non-tender Skin:  No lesions in the area of chief complaint Neurologic:   Sensation  intact distally Lymphatic:  No axillary or cervical lymphadenopathy  Orthopedic Exam:  Right upper extremity exam Skin intact over fingers well-padded sugar-tong splint in place Sensation to light touch intact over all 5 fingers Able to flex and extend all fingers abduct and adduct all fingers Compartments soft  Right lower extremity exam Some ecchymosis over the lateral foot just proximal to the fifth metatarsal Tender to palpation over the cuboid and tip of the lateral malleolus We will dorsiflex and plantarflex the ankle and all the toes Sensation intact over the entire foot to light touch Compartment soft with some swelling of the lateral forefoot and ankle No tenderness to palpation over the medial foot or medial malleolus  X-rays:  X-[facility] reviewed from [facility] clinic system of the right foot and lateral ankle dated 03/31/2022 showing a questionable small avulsion fracture of the lateral aspect of the cuboid nondisplaced no other obvious fractures dislocations noted  Three-view x-rays of the right wrist and hand reviewed from [facility] clinic system dated 03/31/2022 showing a comminuted intra-articular fracture of the distal radius and ulnar styloid with dorsal angulation.  No other fractures or dislocations in any of the carpal bones noted.   Assessment: Right comminuted distal radius and ulnar styloid fracture Right ankle sprain with likely cuboid avulsion fracture  Plan: Patient was scheduled to see Dr. [name] for outpatient follow-up tomorrow in clinic for surgical planning for right distal radius fracture.  I had extensive conversation with the patient about the fracture pattern the risks and benefits of nonoperative versus operative intervention and the likely need for surgical correction in her right wrist.  The patient will be scheduled for outpatient surgical intervention on her right wrist with Dr. [name] within the next week or so.  With regards to the right ankle  there is a likely small avulsion fracture of the cuboid which can explain her pain and swelling however given her continued pain and inability to ambulate agree with medicine plan for CT scan of the right ankle to evaluate for any other injuries.  I have ordered a Cam walking boot for the right foot and ankle to help stabilize any ligamentous injury and provide support for ambulation.  Pending the CT scan results confirming there are no other fractures she may weight-bear as tolerated the RLE in the cam walking boot.  I will provide an update after reviewing the CT scan images myself.  No plan for surgical intervention on this admission the patient may have a diet from an orthopedic standpoint.  Nonweightbearing right upper extremity the splint.  Questions answered the patient and daughter agree with the above plan.  Case discussed with Dr. [name].   Steffanie Rainwater MD  Beeper #:  [phone]  04/02/2022 9:09 AM

## 2022-04-02 NOTE — ED Provider Notes (Signed)
[facility] Provider Note   Event Date/Time   First MD Initiated Contact with Patient 04/01/22 2355     (approximate) History  Medication Refill and Arm Pain  HPI Sandra [name] is a 76 y.o. female with a past medical history of GERD, hyperlipidemia, hypothyroidism, hypertension who presents complaining of continued right upper extremity pain after a fall yesterday and diagnosed with a distal humerus fracture as well as a wrist fracture on the right side.  Patient also complains of right lower extremity pain with diagnosis of a right ankle sprain yesterday as well.  Patient states that she was prescribed tramadol for home pain control but has been unable to control his pain so far.  Patient states that she has been in a 9/10 since the injury and the medications have not helped at all.  Patient also endorses using ibuprofen at home that has somewhat improved pain but it comes back quickly.  Patient also concerned as she is unable to get up and go to the bathroom due to her right ankle sprain I keeps her from moving quickly and get to the bathroom in time.  Patient states she lives alone ROS: Patient currently denies any vision changes, tinnitus, difficulty speaking, facial droop, sore throat, chest pain, shortness of breath, abdominal pain, nausea/vomiting/diarrhea, dysuria, or weakness/numbness/paresthesias in any extremity   Physical Exam  Triage Vital Signs: ED Triage Vitals  Enc Vitals Group     BP 04/01/22 2313 138/67     Pulse Rate 04/01/22 2313 67     Resp 04/01/22 2313 18     Temp 04/01/22 2313 97.6 F (36.4 C)     Temp Source 04/01/22 2313 Oral     SpO2 04/01/22 2313 97 %     Weight 04/01/22 2313 133 lb (60.3 kg)     Height 04/01/22 2313 5' 3"  (1.6 m)     Head Circumference --      Peak Flow --      Pain Score 04/01/22 2320 9     Pain Loc --      Pain Edu? --      Excl. in [place]? --    Most recent vital signs: Vitals:   04/01/22 2313 04/02/22 0136   BP: 138/67 (!) 118/55  Pulse: 67 63  Resp: 18 17  Temp: 97.6 F (36.4 C) 98.5 F (36.9 C)  SpO2: 97% 98%   General: Awake, oriented x4. CV:  Good peripheral perfusion.  Resp:  Normal effort.  Abd:  No distention.  Other:  Elderly Caucasian female laying in bed in no acute distress with splint to right upper extremity and Ace wrap in place to right ankle.  Distally neurovascularly intact in her right upper and lower extremities ED Results / Procedures / Treatments  Labs (all labs ordered are listed, but only abnormal results are displayed) Labs Reviewed  CBC WITH DIFFERENTIAL/PLATELET - Abnormal; Notable for the following components:      Result Value   Hemoglobin 11.2 (*)    HCT 35.3 (*)    All other components within normal limits  COMPREHENSIVE METABOLIC PANEL - Abnormal; Notable for the following components:   Potassium 3.3 (*)    Glucose, Bld 111 (*)    All other components within normal limits  SURGICAL PCR SCREEN  BASIC METABOLIC PANEL  CBC    PROCEDURES: Critical Care performed: No .1-3 Lead EKG Interpretation  Performed by: Naaman Plummer, MD Authorized by: Naaman Plummer, MD  Interpretation: normal     ECG rate:  65   ECG rate assessment: normal     Rhythm: sinus rhythm     Ectopy: none     Conduction: normal    MEDICATIONS ORDERED IN ED: Medications  aspirin EC tablet 81 mg (has no administration in time range)  amLODipine (NORVASC) tablet 10 mg (has no administration in time range)  atorvastatin (LIPITOR) tablet 40 mg (has no administration in time range)  metoprolol succinate (TOPROL-XL) 24 hr tablet 25 mg (has no administration in time range)  ALPRAZolam (XANAX) tablet 0.5 mg (0.5 mg Oral Given 04/02/22 0239)  sertraline (ZOLOFT) tablet 100 mg (has no administration in time range)  levothyroxine (SYNTHROID) tablet 100 mcg (has no administration in time range)  bisacodyl (DULCOLAX) EC tablet 5 mg (has no administration in time range)  docusate  sodium (COLACE) capsule 100 mg (has no administration in time range)  pantoprazole (PROTONIX) EC tablet 40 mg (has no administration in time range)  polyethylene glycol (MIRALAX / GLYCOLAX) packet 17 g (has no administration in time range)  calcium-vitamin D (OSCAL WITH D) 500-5 MG-MCG per tablet (has no administration in time range)  cholecalciferol (VITAMIN D3) 25 MCG (1000 UNIT) tablet 5,000 Units (has no administration in time range)  multivitamin with minerals tablet 1 tablet (1 tablet Oral Patient Refused/Not Given 04/02/22 0235)  potassium chloride SA (KLOR-CON M) CR tablet 20 mEq (has no administration in time range)  polyvinyl alcohol (LIQUIFILM TEARS) 1.4 % ophthalmic solution 1 drop (has no administration in time range)  latanoprost (XALATAN) 0.005 % ophthalmic solution 1 drop (has no administration in time range)  timolol (TIMOPTIC) 0.5 % ophthalmic solution 1 drop (has no administration in time range)  enoxaparin (LOVENOX) injection 40 mg (has no administration in time range)  0.9 % NaCl with KCl 20 mEq/ L  infusion ( Intravenous New Bag/Given 04/02/22 0229)  acetaminophen (TYLENOL) tablet 650 mg (has no administration in time range)    Or  acetaminophen (TYLENOL) suppository 650 mg (has no administration in time range)  traZODone (DESYREL) tablet 25 mg (has no administration in time range)  magnesium hydroxide (MILK OF MAGNESIA) suspension 30 mL (has no administration in time range)  ondansetron (ZOFRAN) tablet 4 mg (has no administration in time range)    Or  ondansetron (ZOFRAN) injection 4 mg (has no administration in time range)  morphine (PF) 2 MG/ML injection 2 mg (2 mg Intravenous Given 04/02/22 0343)  morphine (PF) 4 MG/ML injection 4 mg (4 mg Intravenous Given 04/02/22 0102)  ketorolac (TORADOL) 30 MG/ML injection 15 mg (15 mg Intravenous Given 04/02/22 0102)   IMPRESSION / MDM / ASSESSMENT AND PLAN / ED COURSE  I reviewed the triage vital signs and the nursing notes.                              The patient is on the cardiac monitor to evaluate for evidence of arrhythmia and/or significant heart rate changes. Patient's presentation is most consistent with acute presentation with potential threat to life or bodily function. The Pt was found to have a closed right distal humerus and right ulnar styloid fracture on XR. The Pt is otherwise well appearing, hemodynamically stable, and shows no evidence of neurovascular injury or compartment syndrome.  I have low suspicion for dislocation, significant ligamentous injury, septic arthritis, gout flare, new autoimmune arthropathy, or gonococcal arthropathy.  Due to failure of outpatient pain management  and need for IV analgesics, patient will require admission to the internal medicine service for further evaluation and management  Dispo: Admit to medicine   FINAL CLINICAL IMPRESSION(S) / ED DIAGNOSES   Final diagnoses:  Closed fracture of distal end of right humerus, unspecified fracture morphology, initial encounter  Closed nondisplaced fracture of styloid process of right ulna, initial encounter   Rx / DC Orders   ED Discharge Orders     None      Note:  This document was prepared using Dragon voice recognition software and may include unintentional dictation errors.   Naaman Plummer, MD 04/02/22 [id number]

## 2022-04-02 NOTE — Consult Note (Signed)
Cardiology Consultation   Patient ID: Sandra [name] MRN: [id number]; DOB: 21-Jul-1945  Admit date: 04/01/2022 Date of Consult: 04/02/2022  PCP:  Waylan Rocher, MD   [facility] Cardiologist:  Nelva Bush, MD  Electrophysiologist:  Vickie Epley, MD       Patient Profile:   Sandra [name] is a 76 y.o. female with a hx of paroxysmal atrial fibrillation not on anticoagulation due to falls, s/p Watchman, chronic diastolic heart failure, chronic anemia, CAD, prior [place] 2022 who is being seen 04/02/2022 for the evaluation of palpitations and falls at the request of Dr. Tana Coast.  History of Present Illness:   Sandra [name] has history as noted. She has severe symptoms when she has episodes of atrial fibrillation. Her most recent episode was two weeks ago when her daughter was visiting (she notes this was a happy stress). Her symptoms continued through much of the weekend, then resolved. She has not had atrial fibrillation since. Her two most recent falls were mechanical falls. Her daughter at bedside notes that instability has been a long term issue for her. Patient also notes significant chronic stress caring for her husband with end stage COPD.  Denies chest pain, shortness of breath at rest or with normal exertion. No PND, orthopnea, LE edema or unexpected weight gain. No syncope. No palpitations in the last two weeks.   Past Medical History:  Diagnosis Date   (HFpEF) heart failure with preserved ejection fraction ([place])    a. 08/2019 Echo: EF 60-65%, no rwma, mild LVH, Gr2 DD, nl RV fxn. Nl pASP. Mildly dil LA. Mild to mod MR.   Anemia    Anxiety    Arthritis    BRCA gene mutation negative 10/2016   NEGATIVE: [facility]   Breast cancer ([place]) 10/16/2016   T1c,N0;, GRADE I/III, 1.6cm. ER/PR pos  HER2 not over expressed, Right Upper Outer   Celiac disease    Cerebellar hemorrhage, acute ([place]) 07/17/2020   Depression    Dyspnea    WITH EXERTION   GERD  (gastroesophageal reflux disease)    OCC   Glaucoma    right eye   Headache    H/O MIGRAINES AS TEENAGER   Heart murmur    a. 08/2019 Echo: mild to mod MR.   Hypertension    Hypothyroidism    Non-obstructive CAD (coronary artery disease)    a. 01/2017 MV: Hypertensive response. No ischemia/infarct. EF >65%; b. 09/2019 Cor CTA: Ca2+ = 9.29 (36th percentile). LAD calcified plaque (0-24%), otw nl. Multipel bilat pulm nodules up to 12m.   PAF (paroxysmal atrial fibrillation) ([place]    a. 02/2020 [facility]: predominantly RSR, 65 (50-105), rare PACs/PVCs, 8 beats NSVT, multiple episodes of PAF lasting up to 1hr 253ms. Avg AF rate 130 (93-170). AF burden <1%. Triggered events = RSR, PACs, and PAF; b. CHA2DS2VASc = 6.   Pneumonia    YEARS AGO   Pulmonary nodules    a. 09/2019 Cor CTA: incidental finding of multiple bilat pulm nodules; b. 12/2019 High Res CT: stable, scattered solid pulm nodules.   Raynaud's disease    RLS (restless legs syndrome)    [facility]    Past Surgical History:  Procedure Laterality Date   ABDOMINAL HYSTERECTOMY     APPENDECTOMY     AXILLARY LYMPH NODE BIOPSY Right 03/05/2017   Procedure: AXILLARY LYMPH NODE BIOPSY;  Surgeon: ByRobert BellowMD;  Location: [facility] ORS;  Service: General;  Laterality: Right;   CATARACT  EXTRACTION Bilateral    CESAREAN SECTION     COLONOSCOPY WITH PROPOFOL N/A 05/19/2015   Procedure: COLONOSCOPY WITH PROPOFOL;  Surgeon: Manya Silvas, MD;  Location: [facility] ENDOSCOPY;  Service: Endoscopy;  Laterality: N/A;   COLONOSCOPY WITH PROPOFOL N/A 08/01/2019   Procedure: COLONOSCOPY WITH PROPOFOL;  Surgeon: Lucilla Lame, MD;  Location: [facility] ENDOSCOPY;  Service: Endoscopy;  Laterality: N/A;   EYE SURGERY     HAND SURGERY Left    LEFT ATRIAL APPENDAGE OCCLUSION N/A 01/27/2021   Procedure: LEFT ATRIAL APPENDAGE OCCLUSION;  Surgeon: Vickie Epley, MD;  Location: [facility] CV LAB;  Service: Cardiovascular;  Laterality: N/A;   MASTECTOMY Right  11/07/2016   RESIDUAL INVASIVE MAMMARY CARCINOMA, SUBAREOLAR ANTERIOR TO PREVIOUS    SENTINEL NODE BIOPSY Right 11/07/2016   Procedure: SENTINEL NODE BIOPSY;  Surgeon: Robert Bellow, MD;  Location: [facility] ORS;  Service: General;  Laterality: Right;   SIMPLE MASTECTOMY WITH AXILLARY SENTINEL NODE BIOPSY Right 11/07/2016   6 mm ER/PR 100%; Her 2 neu not overexpressed, T1b, N0.  Surgeon: Robert Bellow, MD;  Location: [facility] ORS;  Service: General;  Laterality: Right;   TEE WITHOUT CARDIOVERSION N/A 01/27/2021   Procedure: TRANSESOPHAGEAL ECHOCARDIOGRAM (TEE);  Surgeon: Vickie Epley, MD;  Location: [facility] CV LAB;  Service: Cardiovascular;  Laterality: N/A;   TEE WITHOUT CARDIOVERSION N/A 03/10/2021   Procedure: TRANSESOPHAGEAL ECHOCARDIOGRAM (TEE);  Surgeon: Elouise Munroe, MD;  Location: [place];  Service: Cardiology;  Laterality: N/A;   TONSILLECTOMY  AGE 73   UPPER GI ENDOSCOPY  01/12/06   hiatus hernia     Home Medications:  Prior to Admission medications   Medication Sig Start Date End Date Taking? Authorizing Provider  amLODipine (NORVASC) 10 MG tablet TAKE 1 TABLET BY MOUTH  DAILY 07/12/21  Yes Jerrol Banana., MD  aspirin EC 81 MG tablet Take 1 tablet (81 mg total) by mouth daily. Swallow whole. 07/27/21  Yes Vickie Epley, MD  atorvastatin (LIPITOR) 40 MG tablet TAKE 1 TABLET BY MOUTH DAILY 09/02/21  Yes Dunn, Areta Haber, PA-C  Calcium Carb-Cholecalciferol (CALCIUM 600 + D PO) Take 1 tablet by mouth daily.   Yes [provider]  Cholecalciferol (VITAMIN D3) 125 MCG (5000 UT) TABS Take 5,000 Units by mouth daily.   Yes [provider]  latanoprost (XALATAN) 0.005 % ophthalmic solution Place 1 drop into both eyes at bedtime.   Yes [provider]  levothyroxine (SYNTHROID) 100 MCG tablet TAKE 1 TABLET BY MOUTH 30  MINUTES BEFORE BREAKFAST 02/02/22  Yes Jerrol Banana., MD  metoprolol succinate (TOPROL-XL) 25 MG 24 hr tablet TAKE  ONE-HALF TABLET BY  MOUTH DAILY AT BEDTIME 11/15/21  Yes Vickie Epley, MD  Multiple Vitamin (MULTIVITAMIN WITH MINERALS) TABS tablet Take 1 tablet by mouth daily at 3 pm.   Yes [provider]  omeprazole (PRILOSEC) 40 MG capsule TAKE 1 CAPSULE BY MOUTH DAILY 02/02/22  Yes Jerrol Banana., MD  potassium chloride SA (KLOR-CON M) 20 MEQ tablet Take 1 tablet (20 mEq total) by mouth daily. 03/24/22  Yes End, Harrell Gave, MD  sertraline (ZOLOFT) 100 MG tablet TAKE 1 TABLET BY MOUTH DAILY 12/05/21  Yes Jerrol Banana., MD  timolol (BETIMOL) 0.5 % ophthalmic solution Place 1 drop into both eyes 2 (two) times daily.   Yes [provider]  traMADol (ULTRAM) 50 MG tablet Take 1 tablet by mouth every 8 (eight) hours as needed. 03/31/22  04/05/22 Yes [provider]  ALPRAZolam Duanne Moron) 0.5 MG tablet Take 1 tablet (0.5 mg total) by mouth at bedtime as needed. for sleep 03/24/22   Birdie Sons, MD  bisacodyl 5 MG EC tablet Take 5 mg by mouth daily as needed for moderate constipation.    [provider]  docusate sodium (COLACE) 100 MG capsule Take 100 mg by mouth daily as needed for mild constipation.    [provider]  hydroxypropyl methylcellulose / hypromellose (ISOPTO TEARS / GONIOVISC) 2.5 % ophthalmic solution Place 1 drop into both eyes 4 (four) times daily as needed for dry eyes.    [provider]  polyethylene glycol (MIRALAX / GLYCOLAX) 17 g packet Take 17 g by mouth daily as needed for moderate constipation.    [provider]    Inpatient Medications: Scheduled Meds:  acetaminophen  1,000 mg Oral Q8H   amLODipine  10 mg Oral Daily   aspirin EC  81 mg Oral Daily   atorvastatin  40 mg Oral Daily   calcium-vitamin D   Oral Daily   cholecalciferol  5,000 Units Oral Daily   enoxaparin (LOVENOX) injection  40 mg Subcutaneous Q24H   latanoprost  1 drop Both Eyes QHS   levothyroxine  100 mcg Oral Q0600   metoprolol  succinate  25 mg Oral Daily   multivitamin with minerals  1 tablet Oral Q1500   pantoprazole  40 mg Oral Daily   potassium chloride SA  20 mEq Oral Daily   sertraline  100 mg Oral Daily   timolol  1 drop Both Eyes BID   Continuous Infusions:  0.9 % NaCl with KCl 20 mEq / L 100 mL/hr at 04/02/22 0229   PRN Meds: acetaminophen **OR** acetaminophen, ALPRAZolam, bisacodyl, docusate sodium, magnesium hydroxide, morphine injection, ondansetron **OR** ondansetron (ZOFRAN) IV, polyethylene glycol, polyvinyl alcohol, traMADol, traZODone  Allergies:   No Known Allergies  Social History:   Social History   Socioeconomic History   Marital status: Married    Spouse name: Nadara Mustard   Number of children: 2   Years of education: Not on file   Highest education level: Some college, no degree  Occupational History   Occupation: retired  Tobacco Use   Smoking status: Never   Smokeless tobacco: Never  Vaping Use   Vaping Use: Never used  Substance and Sexual Activity   Alcohol use: Not Currently   Drug use: No   Sexual activity: Not Currently    Birth control/protection: None  Other Topics Concern   Not on file  Social History Narrative   Lives with Husband   Right handed   Drinks 2-3 cups caffiene daily   Social Determinants of Health   Financial Resource Strain: Low Risk  (05/18/2021)   Overall Financial Resource Strain (CARDIA)    Difficulty of Paying Living Expenses: Not hard at all  Food Insecurity: No Food Insecurity (04/02/2022)   Hunger Vital Sign    Worried About Estate manager/land agent of Food in the Last Year: Never true    Ran Out of Food in the Last Year: Never true  Transportation Needs: No Transportation Needs (04/02/2022)   PRAPARE - Hydrologist (Medical): No    Lack of Transportation (Non-Medical): No  Physical Activity: Insufficiently Active (05/18/2021)   Exercise Vital Sign    Days of Exercise per Week: 3 days    Minutes of Exercise per Session:  30 min  Stress: No Stress Concern Present (05/18/2021)  [place] Questionnaire    Feeling of Stress : Only a little  Social Connections: Socially Integrated (05/18/2021)   Social Connection and Isolation Panel [NHANES]    Frequency of Communication with Friends and Family: More than three times a week    Frequency of Social Gatherings with Friends and Family: More than three times a week    Attends Religious Services: More than 4 times per year    Active Member of [employer] or Organizations: Yes    Attends Music therapist: More than 4 times per year    Marital Status: Married  Human resources officer Violence: Not At Risk (04/02/2022)   Humiliation, Afraid, Rape, and Kick questionnaire    Fear of Current or Ex-Partner: No    Emotionally Abused: No    Physically Abused: No    Sexually Abused: No    Family History:    Family History  Problem Relation Age of Onset   Cancer Mother    Anemia Mother    Other Mother        lymphosarcoma   Alcohol abuse Father    Prostate cancer Father    Breast cancer Sister        45; 1/2 sister, shared mother.   Cancer Sister        bile duct   Stroke Maternal Grandmother    Breast cancer Maternal Grandmother 68   Heart attack Maternal Grandfather    CAD Paternal Grandfather      ROS:  Please see the history of present illness.  Constitutional: Negative for chills, fever, night sweats, unintentional weight loss  HENT: Negative for ear pain and hearing loss.   Eyes: Negative for loss of vision and eye pain.  Respiratory: Negative for cough, sputum, wheezing.   Cardiovascular: See HPI. Gastrointestinal: Negative for abdominal pain, melena, and hematochezia.  Genitourinary: Negative for dysuria and hematuria.  Musculoskeletal: Negative for falls and myalgias.  Skin: Negative for itching and rash.  Neurological: Negative for focal weakness, focal sensory changes and loss of  consciousness. Positive for chronic instability/falls Endo/Heme/Allergies: Does bruise/bleed easily.   All other ROS reviewed and negative.     Physical Exam/Data:   Vitals:   04/01/22 2313 04/02/22 0136 04/02/22 0752  BP: 138/67 (!) 118/55 (!) 124/59  Pulse: 67 63 61  Resp: _0 Temp: 97.6 F (36.4 C) 98.5 F (36.9 C) 98.4 F (36.9 C)  TempSrc: Oral    SpO2: 97% 98% 94%  Weight: 60.3 kg    Height: _1  (1.6 m)      Intake/Output Summary (Last 24 hours) at 04/02/2022 1331 Last data filed at 04/02/2022 1050 Gross per 24 hour  Intake --  Output 200 ml  Net -200 ml      04/01/2022   11:13 PM 03/22/2022    3:23 PM 12/12/2021    3:08 PM  Last 3 Weights  Weight (lbs) 133 lb 133 lb 136 lb  Weight (kg) 60.328 kg 60.328 kg 61.689 kg     Body mass index is 23.56 kg/m.  General:  Well nourished, well developed, in no acute distress. Hard of hearing. HEENT: normal Neck: no JVD Vascular: No carotid bruits; Distal pulses 2+ bilaterally Cardiac:  normal S1, S2; RRR; 1/6 systolic murmur Lungs:  clear to auscultation bilaterally, no wheezing, rhonchi or rales  Abd: soft, nontender, no hepatomegaly  Ext: no edema Musculoskeletal:  R arm in sling, R foot immobilized Skin: warm and dry  Neuro:  CNs 2-12 intact, no focal abnormalities noted Psych:  Normal affect   EKG:  The EKG was personally reviewed and demonstrates:  no new this admission, ordered Telemetry:  Telemetry was personally reviewed and demonstrates:  not currently on telemetry  Relevant CV Studies: Outpatient echo pending  Laboratory Data:  High Sensitivity Troponin:  No results for input(s): "TROPONINIHS" in the last 720 hours.   Chemistry Recent Labs  Lab 04/02/22 0057 04/02/22 0622  NA 140 140  K 3.3* 3.5  CL 108 111  CO2 25 24  GLUCOSE 111* 104*  BUN 12 12  CREATININE 0.74 0.66  CALCIUM 9.2 8.4*  GFRNONAA >60 >60  ANIONGAP 7 5    Recent Labs  Lab 04/02/22 0057  PROT 7.4  ALBUMIN 4.1   AST 34  ALT 34  ALKPHOS 82  BILITOT 1.1   Lipids No results for input(s): "CHOL", "TRIG", "HDL", "LABVLDL", "LDLCALC", "CHOLHDL" in the last 168 hours.  Hematology Recent Labs  Lab 04/02/22 0057 04/02/22 0622  WBC 7.2 5.5  RBC 4.18 3.65*  HGB 11.2* 9.9*  HCT 35.3* 30.8*  MCV 84.4 84.4  MCH 26.8 27.1  MCHC 31.7 32.1  RDW 14.4 14.6  PLT 233 193   Thyroid No results for input(s): "TSH", "FREET4" in the last 168 hours.  BNPNo results for input(s): "BNP", "PROBNP" in the last 168 hours.  DDimer No results for input(s): "DDIMER" in the last 168 hours.   Radiology/Studies:  CT ANKLE RIGHT WO CONTRAST  Result Date: 04/02/2022 CLINICAL DATA:  Right ankle pain after fall 2 days ago EXAM: CT OF THE RIGHT ANKLE WITHOUT CONTRAST TECHNIQUE: Multidetector CT imaging of the right ankle was performed according to the standard protocol. Multiplanar CT image reconstructions were also generated. RADIATION DOSE REDUCTION: This exam was performed according to the departmental dose-optimization program which includes automated exposure control, adjustment of the mA and/or kV according to patient size and/or use of iterative reconstruction technique. COMPARISON:  None Available. FINDINGS: Bones/Joint/Cartilage No acute fracture. No dislocation. Ankle mortise is congruent. Degenerative subchondral cystic changes at the medial greater than lateral talar shoulders. No tibiotalar joint effusion. No intra-articular loose body. Mild joint space narrowing of the subtalar joints and within the midfoot. Ligaments Suboptimally assessed by CT. Muscles and Tendons No acute musculotendinous abnormality by CT. Soft tissues Mild soft tissue swelling, most pronounced at the lateral aspect of the ankle. IMPRESSION: 1. No acute fracture or dislocation of the right ankle. 2. Mild soft tissue swelling, most pronounced at the lateral aspect of the ankle. Electronically Signed   By: Davina Poke D.O.   On: 04/02/2022 10:01    CT HEAD WO CONTRAST (5MM)  Result Date: 04/02/2022 CLINICAL DATA:  Dizziness, persistent or recurrent EXAM: CT HEAD WITHOUT CONTRAST TECHNIQUE: Contiguous axial images were obtained from the base of the skull through the vertex without intravenous contrast. RADIATION DOSE REDUCTION: This exam was performed according to the departmental dose-optimization program which includes automated exposure control, adjustment of the mA and/or kV according to patient size and/or use of iterative reconstruction technique. COMPARISON:  10/20/2020 FINDINGS: Brain: No evidence of acute infarction, hemorrhage, hydrocephalus, extra-axial collection or mass lesion/mass effect. Chronic small vessel disease with unchanged low-density in the deep white matter. Stable and age normal brain volume. Vascular: No hyperdense vessel or unexpected calcification. Skull: Normal. Negative for fracture or focal lesion. Sinuses/Orbits: No acute finding. IMPRESSION: No acute finding, stable from 2022. Electronically Signed   By: Gilford Silvius.D.  On: 04/02/2022 09:52     Assessment and Plan:   Paroxysmal atrial fibrillation Not on anticoagulation 2/2 Falls S/P Watchman -CHA2DS2/VAS Stroke Risk Points= 6 -reports going in and out of afib with symptoms of lightheadedness and palpitations. However, has not had symptoms in two weeks, and has not had any symptoms around the time of her two recent falls -did not tolerate flecainide -given this, would hold on adding additional therapy for atrial fibrillation at this time. Ordered ECG and telemetry while she is here so that we can evaluate for arrhythmia that may be silent. Could consider amiodarone if she starts having afib while admitted, but she has historically been very sensitive to medication--discussed potential side effects of amiodarone today. After shared decision making, will defer changes to management of atrial fibrillation until she is recovered post surgery, unless she has  an episode while admitted  Chronic diastolic heart failure -appears euvolemic today -not on standing diuretic as an outpatient  Frequent falls Closed nondisplaced fracture of R foot Closed displaced fracture of R radius and ulna -per primary team and orthopedic surgery -as above, does not appear to be related to afib, reports these have been mechanical falls.   Risk Assessment/Risk Scores:       For questions or updates, please contact [place] Please consult [email or web address] for contact info under    Signed, Buford Dresser, MD  04/02/2022 1:31 PM

## 2022-04-02 NOTE — Progress Notes (Signed)
[facility]                                                                              Sandra [name], is a 76 y.o. female, DOB - 07-29-1945, BJY:[id number] Admit date - 04/01/2022    Outpatient Primary MD for the patient is Sandra, [name], MD  LOS - 0  days  Chief Complaint  Patient presents with   Medication Refill   Arm Pain       Brief summary   Patient is a 76 year old female with HFpEF, chronic anemia, history of right breast CA, GERD, hypertension, hypothyroidism, CAD, paroxysmal A-fib, prior ICH in 06/2020 presented to ED with falls.  Patient reportedly had 3 falls within the last 1 week.  She had a fall on Friday, 11/3 with subsequent right arm displaced fracture involving the radius and ulna and right ankle pain.  Patient was seen in the [facility] urgent care and was diagnosed with right distal radius and ulna fracture and right ankle sprain.  Patient was scheduled to see Dr. [name] outpatient on 11/6 for surgical planning however due to acute pain patient presented to ED.  Per patient she has been losing her balance, worse in the last 1 week.  Patient's daughter however at the bedside reported that she has been going in and out of atrial fibrillation with RVR and feeling dizzy and weak.  They had seen cardiology Dr. [name] last month for the same, was considering antiarrhythmic therapy and referring her to Dr. [name].  However at that time patient had no falls for 6 months. Patient had a fall in 2021 when she hit her head and had intracranial hemorrhage.  Patient had a fall this past week in [employer] and reported that she did hit her head. She has chronic baseline urinary incontinence but reported dysuria, no fevers or chills or flank pain, no hematuria.  Patient was admitted for further work-up   Assessment & Plan    Principal Problem:   Right arm fracture, recurrent falls -Seen in the [facility] urgent care, was diagnosed  with distal right radius and ulna fracture -She was scheduled to see Dr. [name] for outpatient follow-up in clinic for surgical planning however presented to ED due to acute pain -Seen by orthopedics today, recommended plan for outpatient surgical intervention this week  Active Problems:   Closed nondisplaced fracture of cuboid bone of right foot -Patient has significant swelling of her right foot and inability to bear weight -Ordered CT of the right ankle.  Discussed with orthopedics, Dr. Rolanda Jay, does appear to have small fracture of cuboid bone of the right foot on the x-ray, however could have a higher fracture, await CT findings for further work-up -Requiring IV morphine for pain -Continue IV morphine as needed for severe pain, added tramadol 50 mg every 6 hours as needed for moderate pain, scheduled Tylenol today    Recurrent falls ?  Due to pA-fib with RVR -I am concerned about atrial fibrillation with RVR causing hypotension and dizziness and subsequent falls - will check UA, B12, folate, UA, vitamin D level to rule out other  medical causes.  TSH 3.3 on 03/22/2022 -CT head to rule out any cerebellar CVA -Not on any anticoagulation due to prior ICH in 2021  Paroxysmal atrial fibrillation -Currently in normal sinus rhythm although per daughter she has been having A-fib with RVR episodes at home with associated palpitations and lightheadedness, falls.  She had successful watchman implantation in 01/2021, seen by Dr. [name] on 03/22/2022 -Currently rate controlled, not on any anticoagulation due to prior ICH -Currently on aspirin 81 mg daily -Cardiology, Dr. [name] was considering antiarrhythmic therapy and referring back to Dr. [name].  Cardiology consulted, discussed with Dr. Harrell Gave today. -Await cardiology recommendations, currently on Toprol-XL 25 mg daily.  Per patient she had been recommended to take 1/2 metoprolol extra for rate control when she has palpitations but no significant  effect. -CT head today showed no embolic or cerebellar seen gait, no bleeding    Hypothyroidism -Stable, TSH 3.3, continue Synthroid 100 mcg daily  Dysuria -Obtain UA and culture -Will place on antibiotics pending the results if positive for UTI    Hyperlipidemia LDL goal <70 -Continue statin    GERD (gastroesophageal reflux disease) -Continue PPI    Chronic heart failure with preserved ejection fraction (HFpEF) ([place]) -TEE in 02/2021 had shown EF of 60 to 65%, LVEF and RV SF -Currently euvolemic, not on any diuretics outpatient    Essential hypertension -BP currently stable, continue Norvasc, Toprol-XL 25 mg daily    Hemorrhagic cerebrovascular accident (CVA) ([place]) -Stable, repeat CT head this morning showed no bleeding or stroke   Code Status: Full DVT Prophylaxis:  enoxaparin (LOVENOX) injection 40 mg Start: 04/02/22 0800   Level of Care: Level of care: Med-Surg Family Communication: Updated patient's daughter at the bedside  Disposition Plan:      Remains inpatient appropriate: Work-up in progress, needs cardiology evaluation, CT right ankle, further management pending results  The patient is requires high complexity decision making for assessment and support, frequent evaluation, ordering and review of labs and radiographic studies and titration of therapies, discussion with consultants and nursing.  Discussed with orthopedics and cardiology personally.  Bedside evaluation and discussion with patient and daughter >30mns.  Time devoted to patient care services described in this note: 45 minutes    Procedures:  None  Consultants:   Ortho  Cardiology  Antimicrobials: none   Medications  amLODipine  10 mg Oral Daily   aspirin EC  81 mg Oral Daily   atorvastatin  40 mg Oral Daily   calcium-vitamin D   Oral Daily   cholecalciferol  5,000 Units Oral Daily   enoxaparin (LOVENOX) injection  40 mg Subcutaneous Q24H   latanoprost  1 drop Both Eyes QHS    levothyroxine  100 mcg Oral Q0600   metoprolol succinate  25 mg Oral Daily   multivitamin with minerals  1 tablet Oral Q1500   pantoprazole  40 mg Oral Daily   potassium chloride SA  20 mEq Oral Daily   sertraline  100 mg Oral Daily   timolol  1 drop Both Eyes BID      Subjective:   CHollin Crewewas seen and examined today.  Currently no dizziness however has not gotten up from the bed, right ankle swollen compared to the left.  Right arm in sling, pain currently controlled.  Daughter at the bedside, patient has been having palpitations and dizziness with multiple falls at home x3 this past week.  No acute nausea vomiting or diarrhea.    Objective:   Vitals:  04/01/22 2313 04/02/22 0136 04/02/22 0752  BP: 138/67 (!) 118/55 (!) 124/59  Pulse: 67 63 61  Resp: 18 17 17   Temp: 97.6 F (36.4 C) 98.5 F (36.9 C) 98.4 F (36.9 C)  TempSrc: Oral    SpO2: 97% 98% 94%  Weight: 60.3 kg    Height: 5' 3"  (1.6 m)     No intake or output data in the 24 hours ending 04/02/22 0947   Wt Readings from Last 3 Encounters:  04/01/22 60.3 kg  03/22/22 60.3 kg  12/12/21 61.7 kg     Exam General: Alert and oriented x 3, NAD Cardiovascular: S1 S2 auscultated,  RRR Respiratory: Clear to auscultation bilaterally Gastrointestinal: Soft, nontender, nondistended, + bowel sounds Ext: no pedal edema bilaterally Neuro: Strength 5/5 upper and lower extremities bilaterally Musculoskeletal: Right arm in sling, right ankle swollen, tender, ROM decreased Psych: Normal affect and demeanor, alert and oriented x3     Data Reviewed:  I have personally reviewed following labs    CBC Lab Results  Component Value Date   WBC 5.5 04/02/2022   RBC 3.65 (L) 04/02/2022   HGB 9.9 (L) 04/02/2022   HCT 30.8 (L) 04/02/2022   MCV 84.4 04/02/2022   MCH 27.1 04/02/2022   PLT 193 04/02/2022   MCHC 32.1 04/02/2022   RDW 14.6 04/02/2022   LYMPHSABS 1.6 04/02/2022   MONOABS 0.9 04/02/2022   EOSABS 0.0  04/02/2022   BASOSABS 0.0 56/21/3086     Last metabolic panel Lab Results  Component Value Date   NA 140 04/02/2022   K 3.5 04/02/2022   CL 111 04/02/2022   CO2 24 04/02/2022   BUN 12 04/02/2022   CREATININE 0.66 04/02/2022   GLUCOSE 104 (H) 04/02/2022   GFRNONAA >60 04/02/2022   GFRAA 99 07/22/2020   CALCIUM 8.4 (L) 04/02/2022   PROT 7.4 04/02/2022   ALBUMIN 4.1 04/02/2022   LABGLOB 2.4 07/22/2020   AGRATIO 1.8 07/22/2020   BILITOT 1.1 04/02/2022   ALKPHOS 82 04/02/2022   AST 34 04/02/2022   ALT 34 04/02/2022   ANIONGAP 5 04/02/2022    CBG (last 3)  No results for input(s): "GLUCAP" in the last 72 hours.    Coagulation Profile: No results for input(s): "INR", "PROTIME" in the last 168 hours.   Radiology Studies: I have personally reviewed the imaging studies  No results found.     Estill Cotta M.D. [facility] 04/02/2022, 9:47 AM  Available via Epic secure chat 7am-7pm After 7 pm, please refer to night coverage provider listed on amion.

## 2022-04-02 NOTE — Plan of Care (Signed)

## 2022-04-03 ENCOUNTER — Inpatient Hospital Stay (HOSPITAL_COMMUNITY)
Admit: 2022-04-03 | Discharge: 2022-04-03 | Disposition: A | Discharge status: Home / Self Care | Payer: Medicare Other | Attending: Physician Assistant | Admitting: Physician Assistant

## 2022-04-03 DIAGNOSIS — S52614A Nondisplaced fracture of right ulna styloid process, initial encounter for closed fracture: Secondary | ICD-10-CM | POA: Diagnosis not present

## 2022-04-03 DIAGNOSIS — I480 Paroxysmal atrial fibrillation: Secondary | ICD-10-CM

## 2022-04-03 DIAGNOSIS — S42401A Unspecified fracture of lower end of right humerus, initial encounter for closed fracture: Secondary | ICD-10-CM | POA: Diagnosis not present

## 2022-04-03 LAB — CBC
HCT: 31.2 % — ABNORMAL LOW (ref 36.0–46.0)
Hemoglobin: 9.7 g/dL — ABNORMAL LOW (ref 12.0–15.0)
MCH: 26.6 pg (ref 26.0–34.0)
MCHC: 31.1 g/dL (ref 30.0–36.0)
MCV: 85.7 fL (ref 80.0–100.0)
Platelets: 201 10*3/uL (ref 150–400)
RBC: 3.64 MIL/uL — ABNORMAL LOW (ref 3.87–5.11)
RDW: 14.6 % (ref 11.5–15.5)
WBC: 5.3 10*3/uL (ref 4.0–10.5)
nRBC: 0 % (ref 0.0–0.2)

## 2022-04-03 LAB — BASIC METABOLIC PANEL
Anion gap: 4 — ABNORMAL LOW (ref 5–15)
BUN: 10 mg/dL (ref 8–23)
CO2: 23 mmol/L (ref 22–32)
Calcium: 8.6 mg/dL — ABNORMAL LOW (ref 8.9–10.3)
Chloride: 113 mmol/L — ABNORMAL HIGH (ref 98–111)
Creatinine, Ser: 0.57 mg/dL (ref 0.44–1.00)
GFR, Estimated: >60 mL/min (ref >60)
Glucose, Bld: 103 mg/dL — ABNORMAL HIGH (ref 70–99)
Potassium: 4.1 mmol/L (ref 3.5–5.1)
Sodium: 140 mmol/L (ref 135–145)

## 2022-04-03 MED ORDER — METOPROLOL SUCCINATE ER 25 MG PO TB24: 25.0000 mg | ORAL_TABLET | Freq: Every day | ORAL | 2 refills | Status: DC

## 2022-04-03 MED ORDER — URELLE 81 MG PO TABS: 1.0000 | ORAL_TABLET | Freq: Three times a day (TID) | ORAL | 1 refills | Status: DC | PRN

## 2022-04-03 MED ORDER — TRAMADOL HCL 50 MG PO TABS: 50.0000 mg | ORAL_TABLET | Freq: Three times a day (TID) | ORAL | 0 refills | Status: DC | PRN

## 2022-04-03 MED ORDER — ACETAMINOPHEN 325 MG PO TABS: 650.0000 mg | ORAL_TABLET | Freq: Four times a day (QID) | ORAL | Status: DC | PRN

## 2022-04-03 NOTE — Progress Notes (Addendum)
Progress Note  Patient Name: Sandra [name] Date of Encounter: 04/03/2022  Primary Cardiologist: End  Subjective   No chest pain, palpitations, dyspnea, or dizziness. No Afib on tele.   Inpatient Medications    Scheduled Meds:  acetaminophen  1,000 mg Oral Q8H   amLODipine  10 mg Oral Daily   aspirin EC  81 mg Oral Daily   atorvastatin  40 mg Oral Daily   calcium-vitamin D   Oral Daily   cholecalciferol  5,000 Units Oral Daily   enoxaparin (LOVENOX) injection  40 mg Subcutaneous Q24H   latanoprost  1 drop Both Eyes QHS   levothyroxine  100 mcg Oral Q0600   metoprolol succinate  25 mg Oral Daily   multivitamin with minerals  1 tablet Oral Q1500   pantoprazole  40 mg Oral Daily   potassium chloride SA  20 mEq Oral Daily   sertraline  100 mg Oral Daily   timolol  1 drop Both Eyes BID   Urelle  1 tablet Oral TID   Continuous Infusions:  0.9 % NaCl with KCl 20 mEq / L 100 mL/hr at 04/03/22 0200   PRN Meds: acetaminophen **OR** acetaminophen, ALPRAZolam, bisacodyl, docusate sodium, magnesium hydroxide, morphine injection, ondansetron **OR** ondansetron (ZOFRAN) IV, polyethylene glycol, polyvinyl alcohol, traMADol, traZODone   Vital Signs    Vitals:   04/02/22 0752 04/02/22 1517 04/02/22 2300 04/03/22 0733  BP: (!) 124/59 (!) 113/52 121/64 127/66  Pulse: 61 67 68 64  Resp: 17 16 20    Temp: 98.4 F (36.9 C) 98.3 F (36.8 C) 98 F (36.7 C) (!) 95.6 F (35.3 C)  TempSrc:   Oral   SpO2: 94% 95% 95% 96%  Weight:      Height:        Intake/Output Summary (Last 24 hours) at 04/03/2022 0907 Last data filed at 04/03/2022 2595 Gross per 24 hour  Intake 1340 ml  Output 1500 ml  Net -160 ml   Filed Weights   04/01/22 2313  Weight: 60.3 kg    Telemetry    SR - Personally Reviewed  ECG    No new tracings - Personally Reviewed  Physical Exam   GEN: No acute distress.   Neck: No JVD. Cardiac: RRR, I/VI systolic murmur, no rubs, or gallops.  Respiratory: Clear  to auscultation bilaterally.  GI: Soft, nontender, non-distended.   MS: No edema; Right arm in sling.  Neuro:  Alert and oriented x 3; Nonfocal.  Psych: Normal affect.  Labs    Chemistry Recent Labs  Lab 04/02/22 0057 04/02/22 0622 04/03/22 0437  NA 140 140 140  K 3.3* 3.5 4.1  CL 108 111 113*  CO2 25 24 23   GLUCOSE 111* 104* 103*  BUN 12 12 10   CREATININE 0.74 0.66 0.57  CALCIUM 9.2 8.4* 8.6*  PROT 7.4  --   --   ALBUMIN 4.1  --   --   AST 34  --   --   ALT 34  --   --   ALKPHOS 82  --   --   BILITOT 1.1  --   --   GFRNONAA >60 >60 >60  ANIONGAP 7 5 4*     Hematology Recent Labs  Lab 04/02/22 0057 04/02/22 0622 04/03/22 0437  WBC 7.2 5.5 5.3  RBC 4.18 3.65* 3.64*  HGB 11.2* 9.9* 9.7*  HCT 35.3* 30.8* 31.2*  MCV 84.4 84.4 85.7  MCH 26.8 27.1 26.6  MCHC 31.7 32.1 31.1  RDW  14.4 14.6 14.6  PLT 233 193 201    Cardiac EnzymesNo results for input(s): "TROPONINI" in the last 168 hours. No results for input(s): "TROPIPOC" in the last 168 hours.   BNPNo results for input(s): "BNP", "PROBNP" in the last 168 hours.   DDimer No results for input(s): "DDIMER" in the last 168 hours.   Radiology    CT ANKLE RIGHT WO CONTRAST  Result Date: 04/02/2022 IMPRESSION: 1. No acute fracture or dislocation of the right ankle. 2. Mild soft tissue swelling, most pronounced at the lateral aspect of the ankle. Electronically Signed   By: Davina Poke D.O.   On: 04/02/2022 10:01   CT HEAD WO CONTRAST (5MM)  Result Date: 04/02/2022 IMPRESSION: No acute finding, stable from 2022. Electronically Signed   By: Jorje Guild M.D.   On: 04/02/2022 09:52    Cardiac Studies     Patient Profile     76 y.o. female with history of paroxysmal atrial fibrillation not on anticoagulation due to falls, s/p Watchman, chronic diastolic heart failure, chronic anemia, CAD, prior [name] 2022 who is being seen 04/02/2022 for the evaluation of palpitations and falls at the request of Dr. Tana Coast.    Assessment & Plan    1. Paroxysmal atrial fibrillation -not on anticoagulation secondary to falls -status post Watchman -CHA2DS2/VAS Stroke Risk Points= 6 -reports going in and out of afib with symptoms of lightheadedness and palpitations. However, has not had symptoms in two weeks, and has not had any symptoms around the time of her two recent falls -did not tolerate flecainide -given this, hold on adding additional therapy for atrial fibrillation at this time -ordered ECG 11/5 (pending completion) -monitor on telemetry while she is here so that we can evaluate for arrhythmia that may be silent -place ZIO AT at discharge  -could consider amiodarone if she starts having afib while admitted, but she has historically been very sensitive to medication--at time of consult, potential side effects of amiodarone were discussed -after shared decision making, will defer changes to management of atrial fibrillation until she is recovered post surgery, unless she has an episode while admitted   Chronic diastolic heart failure -appears euvolemic today -not on standing diuretic as an outpatient   Frequent falls -closed nondisplaced fracture of R foot -closed displaced fracture of R radius and ulna -per primary team and orthopedic surgery -as above, does not appear to be related to afib, reports these have been mechanical falls  Preoperative cardiac risk stratification -no symptoms of angina or decompensation  -not on [place] -active at baseline -she may proceed with noncardiac surgery without further cardiac testing at an overall moderate risk     For questions or updates, please contact [facility] Please consult [email or web address] for contact info under Cardiology/STEMI.    Signed, Christell Faith, PA-C [place] Pager: [phone] 04/03/2022, 9:07 AM

## 2022-04-03 NOTE — Evaluation (Signed)
Physical Therapy Evaluation Patient Details Name: Sandra [name] MRN: [id number] DOB: [birth year] Today's Date: 04/03/2022  History of Present Illness  Sandra [name] is a 76 y.o. female who had a mechanical fall after a trip 2 days ago and was seen in the [facility] urgent care and diagnosed with a right distal radius and ulna fracture and a right ankle sprain.  She had another fall yesterday and states she has had a long-term issue with balance leading to multiple falls.  Her right wrist and arm are in a sugar-tong splint she denies any numbness or tingling in her fingers.  She reports swelling and pain with weightbearing in her right foot and lateral ankle.  She denies any chest pain, shortness of breath, palpitations, syncope, or focal weakness.  Clinical Impression  Pt is a pleasant 76 year old female who was admitted for R UE fx and R ankle. Once CAM boot donned, PT evaluation performed. Pt performs bed mobility, transfers, and ambulation with min assist with HHA. At this time, needs constant assist for safety as pt is high falls risk for recurrent falls. Pt demonstrates deficits with strength/mobility/pain. Pt is currently not at baseline level. Would benefit from skilled PT to address above deficits and promote optimal return to PLOF; recommend transition to STR upon discharge from acute hospitalization. If pt able to secure 24/7 care, would be able to be safely at home with max [facility] services. Per notes, pending further surgery to address fx. Will continue to progress.      Recommendations for follow up therapy are one component of a multi-disciplinary discharge planning process, led by the attending physician.  Recommendations may be updated based on patient status, additional functional criteria and insurance authorization.  Follow Up Recommendations Skilled nursing-short term rehab (<3 hours/day) Can patient physically be transported by private vehicle: Yes    Assistance Recommended at  Discharge Frequent or constant Supervision/Assistance  Patient can return home with the following  A lot of help with walking and/or transfers;A lot of help with bathing/dressing/bathroom;Assist for transportation;Help with stairs or ramp for entrance    Equipment Recommendations  (TBD)  Recommendations for Other Services       Functional Status Assessment Patient has had a recent decline in their functional status and demonstrates the ability to make significant improvements in function in a reasonable and predictable amount of time.     Precautions / Restrictions Precautions Precautions: Fall Restrictions Weight Bearing Restrictions: Yes RUE Weight Bearing: Non weight bearing RLE Weight Bearing: Weight bearing as tolerated      Mobility  Bed Mobility Overal bed mobility: Needs Assistance Bed Mobility: Supine to Sit, Sit to Supine     Supine to sit: Min guard Sit to supine: Min assist   General bed mobility comments: needs assist/cues for sequencing. Follows commands well    Transfers Overall transfer level: Needs assistance Equipment used: 1 person hand held assist Transfers: Sit to/from Stand Sit to Stand: Min assist           General transfer comment: requires momentum to perform transfer. Once standing, good weight acceptance noted through R LE.    Ambulation/Gait Ambulation/Gait assistance: Min assist Gait Distance (Feet): 50 Feet Assistive device: 1 person hand held assist Gait Pattern/deviations: Step-to pattern       General Gait Details: cues for sequencing and safety. INitially required mod assist for ambulation, however improved to min assist with repetition. Heavy cues for safety  Stairs  Wheelchair Mobility    Modified Rankin (Stroke Patients Only)       Balance Overall balance assessment: Needs assistance, History of Falls Sitting-balance support: Feet supported, Bilateral upper extremity supported Sitting balance-Leahy  Scale: Good     Standing balance support: Single extremity supported Standing balance-Leahy Scale: Poor                               Pertinent Vitals/Pain Pain Assessment Pain Assessment: Faces Faces Pain Scale: Hurts a little bit Pain Location: R foot Pain Descriptors / Indicators: Aching, Dull Pain Intervention(s): Limited activity within patient's tolerance    Home Living Family/patient expects to be discharged to:: Private residence Living Arrangements: Spouse/significant other Available Help at Discharge: Family;Available PRN/intermittently Type of Home: House Home Access: Stairs to enter Entrance Stairs-Rails: None Entrance Stairs-Number of Steps: 2   Home Layout: Two level;Able to live on main level with bedroom/bathroom Home Equipment: BSC/3in1      Prior Function Prior Level of Function : Independent/Modified Independent             Mobility Comments: furniture cruises at home. Reports multiple recent falls. 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     Hand Dominance        Extremity/Trunk Assessment   Upper Extremity Assessment Upper Extremity Assessment: Overall WFL for tasks assessed (R UE NT due to splint)    Lower Extremity Assessment Lower Extremity Assessment: Generalized weakness (R LE grossly 3/5; L LE grossly 4/5)       Communication   Communication: No difficulties  Cognition Arousal/Alertness: Awake/alert Behavior During Therapy: WFL for tasks assessed/performed Overall Cognitive Status: Within Functional Limits for tasks assessed                                          General Comments      Exercises Other Exercises Other Exercises: ambulated to bathroom and needs min assist for all hygiene and donning underwear   Assessment/Plan    PT Assessment Patient needs continued PT services  PT Problem List Decreased strength;Decreased mobility;Pain;Decreased balance;Decreased safety  awareness       PT Treatment Interventions DME instruction;Gait training;Stair training;Therapeutic exercise;Balance training    PT Goals (Current goals can be found in the Care Plan section)  Acute Rehab PT Goals Patient Stated Goal: to go home PT Goal Formulation: With patient Time For Goal Achievement: 04/17/22 Potential to Achieve Goals: Good    Frequency 7X/week     Co-evaluation               AM-PAC PT "6 Clicks" Mobility  Outcome Measure Help needed turning from your back to your side while in a flat bed without using bedrails?: A Little Help needed moving from lying on your back to sitting on the side of a flat bed without using bedrails?: A Little Help needed moving to and from a bed to a chair (including a wheelchair)?: A Little Help needed standing up from a chair using your arms (e.g., wheelchair or bedside chair)?: A Little Help needed to walk in hospital room?: A Lot Help needed climbing 3-5 steps with a railing? : A Lot 6 Click Score: 16    End of Session   Activity Tolerance: Patient tolerated treatment well Patient left: in bed;with bed alarm set Nurse Communication: Mobility status  PT Visit Diagnosis: Unsteadiness on feet (R26.81);Muscle weakness (generalized) (M62.81);History of falling (Z91.81);Difficulty in walking, not elsewhere classified (R26.2);Pain Pain - Right/Left: Right Pain - part of body: Leg    Time: 0109-3235 PT Time Calculation (min) (ACUTE ONLY): 39 min   Charges:   PT Evaluation $PT Eval Low Complexity: 1 Low PT Treatments $Gait Training: 8-22 mins $Therapeutic Activity: 8-22 mins        Greggory Stallion, PT, DPT, GCS [phone]   Sandra [name] 04/03/2022, 3:07 PM

## 2022-04-03 NOTE — Plan of Care (Signed)
  Problem: Education: Goal: Knowledge of General Education information will improve Description: Including pain rating scale, medication(s)/side effects and non-pharmacologic comfort measures Outcome: Progressing   Problem: Health Behavior/Discharge Planning: Goal: Ability to manage health-related needs will improve Outcome: Progressing   Problem: Clinical Measurements: Goal: Respiratory complications will improve Outcome: Progressing   Problem: Activity: Goal: Risk for activity intolerance will decrease Outcome: Progressing   Problem: Nutrition: Goal: Adequate nutrition will be maintained Outcome: Progressing   Problem: Coping: Goal: Level of anxiety will decrease Outcome: Progressing   Problem: Elimination: Goal: Will not experience complications related to bowel motility Outcome: Progressing

## 2022-04-03 NOTE — Progress Notes (Signed)
PT Cancellation Note  Patient Details Name: NEVEYAH GARZON MRN: [id number] DOB: 05-Sep-1945   Cancelled Treatment:    Reason Eval/Treat Not Completed: Other (comment). Went to pt room to introduce self. Per chart review, MD placed order for pt to WB with CAM boot donned. Once CAM boot delivered to room, will perform evaluation. RN to communicate with therapist via secure chat.    Lucky Alverson 04/03/2022, 10:27 AM Greggory Stallion, PT, DPT, GCS [phone]

## 2022-04-03 NOTE — TOC Progression Note (Signed)
Transition of Care [facility]) - Progression Note    Patient Details  Name: [name] MRN: [id number] Date of Birth: 1945-09-13  Transition of Care [facility]) CM/SW [place], RN Phone Number: 04/03/2022, 3:58 PM  Clinical Narrative:    Met with the patient's daughter, she will DC home with her daughter today, She lives and works in [facility], She will have to return to [facility] on Monday, The patient is scheduled for outpatient surgery with Dr [name] on Friday in [place], I explained to her that if she was to need STR SNF after Surgery that I would not be able to assist with getting that set up after she discharges, the daughter stated understanding, she stated that she has tried to see if she could get the surgery scheduled sooner but was not able to,  The patient will DC with a CAM boot and expect to have surgery on Friday       Expected Discharge Plan and Services           Expected Discharge Date: 04/03/22                                     Social Determinants of Health (SDOH) Interventions    Readmission Risk Interventions     No data to display

## 2022-04-03 NOTE — Progress Notes (Signed)
Patient and daughter was given verbal discharge instructions, acknowledge understanding and states they will keep all appointments.Taken to car by wheelchair, no distress when leaving the floor.

## 2022-04-03 NOTE — TOC Progression Note (Deleted)
Transition of Care [facility]) - Progression Note    Patient Details  Name: Sandra [name] MRN: [id number] Date of Birth: 10/13/1945  Transition of Care [facility]) CM/SW Commodore, RN Phone Number: 04/03/2022, 1:58 PM  Clinical Narrative:   Transition of Care (TOC) Screening Note   Patient Details  Name: Sandra [name] Date of Birth: 1946/04/19   Transition of Care [facility]) CM/SW Contact:    Conception Oms, RN Phone Number: 04/03/2022, 1:59 PM  The patient is to follow up with Dr [name] for Outpatient Treatment for Ulna FX  Transition of Care Department [facility]) has reviewed patient and no TOC needs have been identified at this time. We will continue to monitor patient advancement through interdisciplinary progression rounds. If new patient transition needs arise, please place a TOC consult.            Expected Discharge Plan and Services           Expected Discharge Date: 04/03/22                                     Social Determinants of Health (SDOH) Interventions    Readmission Risk Interventions     No data to display

## 2022-04-03 NOTE — Evaluation (Signed)
Occupational Therapy Evaluation Patient Details Name: Sandra [name] MRN: [id number] DOB: 11/09/1945 Today's Date: 04/03/2022   History of Present Illness Sandra [name] is a 76 y.o. female who had a mechanical fall after a trip 2 days ago and was seen in the [facility] urgent care and diagnosed with a right distal radius and ulna fracture and a right ankle sprain.  She had another fall yesterday and states she has had a long-term issue with balance leading to multiple falls.  Her right wrist and arm are in a sugar-tong splint she denies any numbness or tingling in her fingers.  She reports swelling and pain with weightbearing in her right foot and lateral ankle.  She denies any chest pain, shortness of breath, palpitations, syncope, or focal weakness.   Clinical Impression   Patient presenting with decreased independence in self-care, functional mobility, safety, balance, strength, and endurance. Patient reports she lives at home with her husband and is his caregiver. Patient reports she has a walk-in shower, shower chair, RW, rollator, BSC, and SPC. At baseline, patient furniture walks (multiple falls), drives, and is independent with ADLs and IADLs. Patient currently functioning at min guard for bed mobility supine<>sit. Patient was able to transfer sit> stand with min A using SPC. Patient required frequent VC for sequencing and safety when ambulating with SPC. Patient ambulated ~30 ft in room with min A. 2 LOB observed. Patient transferred to toilet with min A. Peri-care performed with min guard. Patient was able to adhere to WB precautions throughout session. Patient left in bed with call bell in reach, bed alarm set, and all needs met. Patient will benefit from acute OT to increase overall independence in the areas of ADLs, functional mobility,  in order to safely discharge to the next venue of care.       Recommendations for follow up therapy are one component of a multi-disciplinary  discharge planning process, led by the attending physician.  Recommendations may be updated based on patient status, additional functional criteria and insurance authorization.   Follow Up Recommendations  Skilled nursing-short term rehab (<3 hours/day)    Assistance Recommended at Discharge Frequent or constant Supervision/Assistance  Patient can return home with the following A little help with walking and/or transfers;A little help with bathing/dressing/bathroom;Assistance with cooking/housework;Assist for transportation    Functional Status Assessment  Patient has had a recent decline in their functional status and demonstrates the ability to make significant improvements in function in a reasonable and predictable amount of time.  Equipment Recommendations  Other (comment) (Defer to next venue of care.)    Recommendations for Other Services       Precautions / Restrictions Precautions Precautions: Fall Restrictions Weight Bearing Restrictions: Yes RUE Weight Bearing: Non weight bearing RLE Weight Bearing: Weight bearing as tolerated      Mobility Bed Mobility Overal bed mobility: Needs Assistance Bed Mobility: Supine to Sit, Sit to Supine     Supine to sit: Min guard Sit to supine: Min guard   General bed mobility comments: needs assist/cues for sequencing. Follows commands well    Transfers Overall transfer level: Needs assistance Equipment used: Straight cane Transfers: Sit to/from Stand Sit to Stand: Min assist           General transfer comment: requires momentum to perform transfer. Once standing, good weight acceptance noted through R LE.      Balance Overall balance assessment: Needs assistance, History of Falls Sitting-balance support: Feet supported, Bilateral upper extremity supported Sitting  balance-Leahy Scale: Good     Standing balance support: Single extremity supported Standing balance-Leahy Scale: Poor                              ADL either performed or assessed with clinical judgement   ADL Overall ADL's : Needs assistance/impaired                         Toilet Transfer: Minimal assistance   Toileting- Clothing Manipulation and Hygiene: Min guard       Functional mobility during ADLs: Cane;Cueing for sequencing;Cueing for safety;Minimal assistance        Pertinent Vitals/Pain Pain Assessment Pain Assessment: No/denies pain     Hand Dominance Right   Extremity/Trunk Assessment Upper Extremity Assessment Upper Extremity Assessment: Generalized weakness   Lower Extremity Assessment Lower Extremity Assessment: Generalized weakness       Communication Communication Communication: No difficulties   Cognition Arousal/Alertness: Awake/alert Behavior During Therapy: WFL for tasks assessed/performed Overall Cognitive Status: Within Functional Limits for tasks assessed                                                  Home Living Family/patient expects to be discharged to:: Private residence Living Arrangements: Spouse/significant other Available Help at Discharge: Family;Available PRN/intermittently Type of Home: House Home Access: Stairs to enter [employer] of Steps: 2 Entrance Stairs-Rails: Right Home Layout: Two level;Able to live on main level with bedroom/bathroom     Bathroom Shower/Tub: Walk-in shower   Bathroom Toilet: Handicapped height     Home Equipment: [place] - single Barista (2 wheels);Rollator (4 wheels)          Prior Functioning/Environment Prior Level of Function : Independent/Modified Independent             Mobility Comments: furniture cruises at home. Reports multiple recent falls. 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        OT Problem List: Decreased strength;Impaired balance (sitting and/or standing);Decreased safety awareness;Pain;Decreased activity tolerance;Decreased  knowledge of use of DME or AE      OT Treatment/Interventions: Self-care/ADL training;Therapeutic activities;Balance training;Therapeutic exercise;Patient/family education;DME and/or AE instruction    OT Goals(Current goals can be found in the care plan section) Acute Rehab OT Goals Patient Stated Goal: return home OT Goal Formulation: With patient Time For Goal Achievement: 04/17/22 Potential to Achieve Goals: Good ADL Goals Pt Will Perform Upper Body Dressing: with supervision Pt Will Perform Lower Body Dressing: with supervision Pt Will Transfer to Toilet: with modified independence Pt Will Perform Toileting - Clothing Manipulation and hygiene: with modified independence  OT Frequency: Min 2X/week       AM-PAC OT "6 Clicks" Daily Activity     Outcome Measure Help from another person eating meals?: None Help from another person taking care of personal grooming?: A Little Help from another person toileting, which includes using toliet, bedpan, or urinal?: A Little Help from another person bathing (including washing, rinsing, drying)?: A Little Help from another person to put on and taking off regular upper body clothing?: A Lot Help from another person to put on and taking off regular lower body clothing?: A Lot 6 Click Score: 17   End of Session Equipment Utilized During Treatment: Gait belt (SPC)  Nurse Communication: Mobility status;Other (comment) (Telebox malfunction and purewick)  Activity Tolerance: Patient tolerated treatment well Patient left: in bed;with call bell/phone within reach;with bed alarm set  OT Visit Diagnosis: History of falling (Z91.81);Muscle weakness (generalized) (M62.81)                Time: 9480-1655 OT Time Calculation (min): 28 min Charges:  OT General Charges $OT Visit: 1 Visit OT Evaluation $OT Eval Moderate Complexity: 1 Mod OT Treatments $Self Care/Home Management : 8-22 mins    Ohn Bostic, OTS 04/03/2022, 3:57 PM

## 2022-04-03 NOTE — Discharge Summary (Signed)
Physician Discharge Summary   Patient: Sandra [name] MRN: [id number] DOB: 12-28-1945  Admit date:     04/01/2022  Discharge date: 04/03/22  Discharge Physician: Estill Cotta, MD   PCP: Waylan Rocher, MD   Recommendations at discharge:   Follow-up with Dr. [name], orthopedics for surgical planning of right radius and ulna nondisplaced fractures  Discharge Diagnoses:    Closed nondisplaced fracture of styloid process of right ulna   Closed fracture of right distal radius Closed nondisplaced fracture of cuboid bone of right foot   Recurrent falls   Hypothyroidism   Hyperlipidemia LDL goal <70   GERD (gastroesophageal reflux disease)   Chronic heart failure with preserved ejection fraction (HFpEF) (HCC)   Essential hypertension   Prior Hemorrhagic cerebrovascular accident (CVA) ([place])   PAF (paroxysmal atrial fibrillation) [facility])     Hospital Course:  Patient is a 76 year old female with HFpEF, chronic anemia, history of right breast CA, GERD, hypertension, hypothyroidism, CAD, paroxysmal A-fib, prior ICH in 06/2020 presented to ED with falls.  Patient reportedly had 3 falls within the last 1 week.  She had a fall on Friday, 11/3 with subsequent right arm displaced fracture involving the radius and ulna and right ankle pain.  Patient was seen in the [facility] urgent care and was diagnosed with right distal radius and ulna fracture and right ankle sprain.  Patient was scheduled to see Dr. [name] outpatient on 11/6 for surgical planning however due to acute pain patient presented to ED.   Per patient she has been losing her balance, worse in the last 1 week.  Patient's daughter however at the bedside reported that she has been going in and out of atrial fibrillation with RVR and feeling dizzy and weak.  They had seen cardiology Dr. [name] last month for the same, was considering antiarrhythmic therapy and referring her to Dr. [name].  However at that time patient had no falls for 6  months. Patient had a fall in 2021 when she hit her head and had intracranial hemorrhage.  Patient had a fall this past week in [employer] and reported that she did hit her head. She has chronic baseline urinary incontinence but reported dysuria, no fevers or chills or flank pain, no hematuria.   Patient was admitted for further work-up  Assessment and Plan:   Right comminuted distal radius and ulnar styloid fracture -Seen in the [facility] urgent care, was diagnosed with distal right radius and ulna fracture -She was scheduled to see Dr. [name] for outpatient follow-up in clinic for surgical planning however presented to ED due to acute pain -Seen by orthopedics, Dr Karel Jarvis, recommended plan for outpatient surgical intervention this week Non weightbearing right upper extremity in splint -Continue pain control with Tylenol and tramadol as needed      Closed nondisplaced fracture of cuboid bone of right foot -Patient has significant swelling of her right foot and inability to bear weight -CT of the ankle showed no ankle fracture however has closed nondisplaced fracture of the cuboid bone of the right foot -Continue IV morphine as needed for severe pain, added tramadol 50 mg every 6 hours as needed for moderate pain, scheduled Tylenol today -Per orthopedics, Cam walker boot prior to discharge -Weightbearing as tolerated RLE in the cam walking boot     Recurrent falls -UA negative for UTI, B12, folate, vitamin levels within normal limits TSH 3.3 on 03/22/2022 -CT head negative for any bleeding or CVA -Not on any anticoagulation  due to prior ICH in 2021   Paroxysmal atrial fibrillation -Currently in normal sinus rhythm although per daughter she has been having A-fib with RVR episodes at home with associated palpitations and lightheadedness, falls.  She had successful watchman implantation in 01/2021, seen by Dr. [name] on 03/22/2022 -Currently rate controlled, not on any  anticoagulation due to prior ICH --CT head showed no embolic or cerebellar  CVA or bleeding -Cardiology was consulted due to for possible A-fib with RVR as etiology for her recurrent falls.  However per cardiology, she has not had any symptoms of palpitations in the last 2 weeks hence likely not around the time reported to recent falls.  She had not tolerated flecainide in the past.  Placed ZIO at discharge follow-up with cardiology.     Hypothyroidism -Stable, TSH 3.3, continue Synthroid 100 mcg daily   Dysuria UA negative for UTI -Continue with urelle for dysuria     Hyperlipidemia LDL goal <70 -Continue statin     GERD (gastroesophageal reflux disease) -Continue PPI     Chronic heart failure with preserved ejection fraction (HFpEF) ([place]) -TEE in 02/2021 had shown EF of 60 to 65%, LVEF and RV SF -Currently euvolemic, not on any diuretics outpatient     Essential hypertension -BP currently stable, continue Norvasc, Toprol-XL 25 mg daily     Hemorrhagic cerebrovascular accident (CVA) ([place]) -Stable, repeat CT head showed no bleeding or stroke       Pain control - [place] Controlled Substance Reporting System database was reviewed. and patient was instructed, not to drive, operate heavy machinery, perform activities at heights, swimming or participation in water activities or provide baby-sitting services while on Pain, Sleep and Anxiety Medications; until their outpatient Physician has advised to do so again. Also recommended to not to take more than prescribed Pain, Sleep and Anxiety Medications.  Consultants: Orthopedics, cardiology Procedures performed: none  Disposition: Home Diet recommendation:  Discharge Diet Orders (From admission, onward)     Start     Ordered   04/03/22 0000  Diet - low sodium heart healthy        04/03/22 1238           Cardiac diet DISCHARGE MEDICATION: Allergies as of 04/03/2022   No Known Allergies      Medication List      TAKE these medications    acetaminophen 325 MG tablet Commonly known as: TYLENOL Take 2 tablets (650 mg total) by mouth every 6 (six) hours as needed for mild pain (or Fever >/= 101).   ALPRAZolam 0.5 MG tablet Commonly known as: XANAX Take 1 tablet (0.5 mg total) by mouth at bedtime as needed. for sleep   amLODipine 10 MG tablet Commonly known as: NORVASC TAKE 1 TABLET BY MOUTH  DAILY   aspirin EC 81 MG tablet Take 1 tablet (81 mg total) by mouth daily. Swallow whole.   atorvastatin 40 MG tablet Commonly known as: LIPITOR TAKE 1 TABLET BY MOUTH DAILY   bisacodyl 5 MG EC tablet Generic drug: bisacodyl Take 5 mg by mouth daily as needed for moderate constipation.   CALCIUM 600 + D PO Take 1 tablet by mouth daily.   docusate sodium 100 MG capsule Commonly known as: COLACE Take 100 mg by mouth daily as needed for mild constipation.   hydroxypropyl methylcellulose / hypromellose 2.5 % ophthalmic solution Commonly known as: ISOPTO TEARS / GONIOVISC Place 1 drop into both eyes 4 (four) times daily as needed for dry eyes.  latanoprost 0.005 % ophthalmic solution Commonly known as: XALATAN Place 1 drop into both eyes at bedtime.   levothyroxine 100 MCG tablet Commonly known as: SYNTHROID TAKE 1 TABLET BY MOUTH 30  MINUTES BEFORE BREAKFAST   metoprolol succinate 25 MG 24 hr tablet Commonly known as: TOPROL-XL Take 1 tablet (25 mg total) by mouth daily. What changed: See the new instructions.   multivitamin with minerals Tabs tablet Take 1 tablet by mouth daily at 3 pm.   omeprazole 40 MG capsule Commonly known as: PRILOSEC TAKE 1 CAPSULE BY MOUTH DAILY   polyethylene glycol 17 g packet Commonly known as: MIRALAX / GLYCOLAX Take 17 g by mouth daily as needed for moderate constipation.   potassium chloride SA 20 MEQ tablet Commonly known as: KLOR-CON M Take 1 tablet (20 mEq total) by mouth daily.   sertraline 100 MG tablet Commonly known as: ZOLOFT TAKE 1  TABLET BY MOUTH DAILY   timolol 0.5 % ophthalmic solution Commonly known as: BETIMOL Place 1 drop into both eyes 2 (two) times daily.   traMADol 50 MG tablet Commonly known as: ULTRAM Take 1-2 tablets (50-100 mg total) by mouth every 8 (eight) hours as needed for up to 5 days for moderate pain or severe pain. What changed:  how much to take reasons to take this   Urelle 81 MG Tabs tablet Take 1 tablet (81 mg total) by mouth every 8 (eight) hours as needed for bladder spasms.   Vitamin D3 125 MCG (5000 UT) Tabs Take 5,000 Units by mouth daily.        Follow-up Information     [name], [name], MD. Schedule an appointment as soon as possible for a visit in 2 week(s).   Specialty: Internal Medicine Why: for hospital follow-up Contact information: [place] [address] [phone]         Hessie Knows, MD. Schedule an appointment as soon as possible for a visit in 1 week(s).   Specialty: Orthopedic Surgery Why: for hospital follow-up Contact information: [address] [place] [address] [phone]         Nelva Bush, MD. Schedule an appointment as soon as possible for a visit in 2 week(s).   Specialty: Cardiology Why: for hospital follow-up Contact information: [place] [address] [phone]                Discharge Exam: Danley Danker Weights   04/01/22 2313  Weight: 60.3 kg   S: Pain better controlled.  She did not require IV morphine overnight, no fevers or chills.  Awaiting CAM Walker boot for discharge  Vitals:   04/02/22 0752 04/02/22 1517 04/02/22 2300 04/03/22 0733  BP: (!) 124/59 (!) 113/52 121/64 127/66  Pulse: 61 67 68 64  Resp: 17 16 20    Temp: 98.4 F (36.9 C) 98.3 F (36.8 C) 98 F (36.7 C) (!) 95.6 F (35.3 C)  TempSrc:   Oral   SpO2: 94% 95% 95% 96%  Weight:      Height:        Physical Exam General: Alert and oriented x 3,  NAD Cardiovascular: S1 S2 clear, RRR.  Respiratory: CTAB, no wheezing,  Gastrointestinal: Soft, nontender, nondistended, NBS Ext: no pedal edema bilaterally, right arm in splint Neuro: no new deficits Psych: Normal affect    Condition at discharge: fair  The results of significant diagnostics from this hospitalization (including imaging, microbiology, ancillary and laboratory) are  listed below for reference.   Imaging Studies: CT ANKLE RIGHT WO CONTRAST  Result Date: 04/02/2022 CLINICAL DATA:  Right ankle pain after fall 2 days ago EXAM: CT OF THE RIGHT ANKLE WITHOUT CONTRAST TECHNIQUE: Multidetector CT imaging of the right ankle was performed according to the standard protocol. Multiplanar CT image reconstructions were also generated. RADIATION DOSE REDUCTION: This exam was performed according to the departmental dose-optimization program which includes automated exposure control, adjustment of the mA and/or kV according to patient size and/or use of iterative reconstruction technique. COMPARISON:  None Available. FINDINGS: Bones/Joint/Cartilage No acute fracture. No dislocation. Ankle mortise is congruent. Degenerative subchondral cystic changes at the medial greater than lateral talar shoulders. No tibiotalar joint effusion. No intra-articular loose body. Mild joint space narrowing of the subtalar joints and within the midfoot. Ligaments Suboptimally assessed by CT. Muscles and Tendons No acute musculotendinous abnormality by CT. Soft tissues Mild soft tissue swelling, most pronounced at the lateral aspect of the ankle. IMPRESSION: 1. No acute fracture or dislocation of the right ankle. 2. Mild soft tissue swelling, most pronounced at the lateral aspect of the ankle. Electronically Signed   By: Davina Poke D.O.   On: 04/02/2022 10:01   CT HEAD WO CONTRAST (5MM)  Result Date: 04/02/2022 CLINICAL DATA:  Dizziness, persistent or recurrent EXAM: CT HEAD WITHOUT CONTRAST TECHNIQUE: Contiguous  axial images were obtained from the base of the skull through the vertex without intravenous contrast. RADIATION DOSE REDUCTION: This exam was performed according to the departmental dose-optimization program which includes automated exposure control, adjustment of the mA and/or kV according to patient size and/or use of iterative reconstruction technique. COMPARISON:  10/20/2020 FINDINGS: Brain: No evidence of acute infarction, hemorrhage, hydrocephalus, extra-axial collection or mass lesion/mass effect. Chronic small vessel disease with unchanged low-density in the deep white matter. Stable and age normal brain volume. Vascular: No hyperdense vessel or unexpected calcification. Skull: Normal. Negative for fracture or focal lesion. Sinuses/Orbits: No acute finding. IMPRESSION: No acute finding, stable from 2022. Electronically Signed   By: Jorje Guild M.D.   On: 04/02/2022 09:52    Microbiology: Results for orders placed or performed during the hospital encounter of 04/01/22  Urine Culture     Status: Abnormal (Preliminary result)   Collection Time: 04/02/22 10:47 AM   Specimen: Urine, Clean Catch  Result Value Ref Range Status   Specimen Description   Final    URINE, CLEAN CATCH Performed at [facility], [address] [place], [place] [address]    Special Requests   Final    NONE Performed at [facility], [address], [place], [place] [address]    Culture (A)  Final    >=100,000 COLONIES/mL Lonell Grandchild NEGATIVE RODS SUSCEPTIBILITIES TO FOLLOW Performed at [facility], [place] [address] [place], [place] [address]    Report Status PENDING  Incomplete    Labs: CBC: Recent Labs  Lab 04/02/22 0057 04/02/22 0622 04/03/22 0437  WBC 7.2 5.5 5.3  NEUTROABS 4.7  --   --   HGB 11.2* 9.9* 9.7*  HCT 35.3* 30.8* 31.2*  MCV 84.4 84.4 85.7  PLT 233 193 572   Basic Metabolic Panel: Recent Labs  Lab 04/02/22 0057 04/02/22 0622 04/03/22 0437  NA 140 140 140  K  3.3* 3.5 4.1  CL 108 111 113*  CO2 25 24 23   GLUCOSE 111* 104* 103*  BUN 12 12 10   CREATININE 0.74 0.66 0.57  CALCIUM 9.2 8.4* 8.6*  Liver Function Tests: Recent Labs  Lab 04/02/22 0057  AST 34  ALT 34  ALKPHOS 82  BILITOT 1.1  PROT 7.4  ALBUMIN 4.1   CBG: No results for input(s): "GLUCAP" in the last 168 hours.  Discharge time spent: greater than 30 minutes.  Signed: Estill Cotta, MD [facility] 04/03/2022

## 2022-04-03 NOTE — Plan of Care (Signed)

## 2022-04-03 NOTE — TOC Progression Note (Signed)
Transition of Care [facility]) - Progression Note    Patient Details  Name: Sandra [name] MRN: [id number] Date of Birth: 14-Mar-1946  Transition of [facility]) CM/SW [place], RN Phone Number: 04/03/2022, 2:54 PM  Clinical Narrative:    The patient has a daughter that is staying with her for a few days, however she lives out of town and is not living with the patient, the patient will not be independent at home, due to fractured arm and leg, She may need to go to SNF after surgery that is planned for outpatient , I will not be able to assist with DC to a SNF after she  has surgery         Expected Discharge Plan and Services           Expected Discharge Date: 04/03/22                                     Social Determinants of Health (SDOH) Interventions    Readmission Risk Interventions     No data to display

## 2022-04-04 ENCOUNTER — Other Ambulatory Visit: Payer: Self-pay | Admitting: Orthopedic Surgery

## 2022-04-04 ENCOUNTER — Telehealth: Payer: Self-pay | Admitting: Internal Medicine

## 2022-04-04 ENCOUNTER — Encounter: Payer: Self-pay | Admitting: Orthopedic Surgery

## 2022-04-04 LAB — URINE CULTURE: Culture: >=100000 — AB

## 2022-04-04 NOTE — Telephone Encounter (Signed)
Spoke w/ [name]. She reports that her mother fell last Friday, broke her rt arm and foot.  She did not want to go the ED, so went to [facility] instead and was referred to ortho. Pt was in so much pain that daughter took her to ED and they admitted her. Cardiology rounded on her to make sure her fall was not due to cardiac event. She is wearing a monitor until 11/20. She has a 2 wk f/u w/ Sherri Hammock on 11/22. She knows the results will not be back by that appt and concerned that her mother will have to come to multiple appts [name] lives in [place]). Advised her to keep the hosp f/u and that we can call her w/ monitor results and recommendations once we get those. She is appreciative of the call.

## 2022-04-04 NOTE — Telephone Encounter (Signed)
Daughter would like to speak with RN regarding mother's care.

## 2022-04-07 ENCOUNTER — Other Ambulatory Visit: Payer: Self-pay

## 2022-04-07 ENCOUNTER — Emergency Department: Payer: Medicare Other

## 2022-04-07 ENCOUNTER — Inpatient Hospital Stay
Admission: EM | Admit: 2022-04-07 | Discharge: 2022-04-09 | DRG: 690 | Disposition: A | Discharge status: Home Health | Payer: Medicare Other | Attending: Hospitalist | Admitting: Hospitalist

## 2022-04-07 ENCOUNTER — Encounter: Payer: Self-pay | Admitting: Internal Medicine

## 2022-04-07 DIAGNOSIS — Z95818 Presence of other cardiac implants and grafts: Secondary | ICD-10-CM

## 2022-04-07 DIAGNOSIS — Z811 Family history of alcohol abuse and dependence: Secondary | ICD-10-CM

## 2022-04-07 DIAGNOSIS — D649 Anemia, unspecified: Secondary | ICD-10-CM

## 2022-04-07 DIAGNOSIS — K219 Gastro-esophageal reflux disease without esophagitis: Secondary | ICD-10-CM | POA: Diagnosis present

## 2022-04-07 DIAGNOSIS — I110 Hypertensive heart disease with heart failure: Secondary | ICD-10-CM | POA: Diagnosis present

## 2022-04-07 DIAGNOSIS — Z7982 Long term (current) use of aspirin: Secondary | ICD-10-CM

## 2022-04-07 DIAGNOSIS — Z8042 Family history of malignant neoplasm of prostate: Secondary | ICD-10-CM

## 2022-04-07 DIAGNOSIS — Z1152 Encounter for screening for COVID-19: Secondary | ICD-10-CM

## 2022-04-07 DIAGNOSIS — I5033 Acute on chronic diastolic (congestive) heart failure: Secondary | ICD-10-CM | POA: Diagnosis not present

## 2022-04-07 DIAGNOSIS — R58 Hemorrhage, not elsewhere classified: Secondary | ICD-10-CM | POA: Diagnosis present

## 2022-04-07 DIAGNOSIS — G2581 Restless legs syndrome: Secondary | ICD-10-CM | POA: Diagnosis present

## 2022-04-07 DIAGNOSIS — N10 Acute tubulo-interstitial nephritis: Secondary | ICD-10-CM | POA: Diagnosis not present

## 2022-04-07 DIAGNOSIS — K760 Fatty (change of) liver, not elsewhere classified: Secondary | ICD-10-CM | POA: Diagnosis present

## 2022-04-07 DIAGNOSIS — I2510 Atherosclerotic heart disease of native coronary artery without angina pectoris: Secondary | ICD-10-CM | POA: Diagnosis present

## 2022-04-07 DIAGNOSIS — Z807 Family history of other malignant neoplasms of lymphoid, hematopoietic and related tissues: Secondary | ICD-10-CM

## 2022-04-07 DIAGNOSIS — E876 Hypokalemia: Secondary | ICD-10-CM | POA: Diagnosis present

## 2022-04-07 DIAGNOSIS — R42 Dizziness and giddiness: Secondary | ICD-10-CM

## 2022-04-07 DIAGNOSIS — Z79899 Other long term (current) drug therapy: Secondary | ICD-10-CM

## 2022-04-07 DIAGNOSIS — Z9011 Acquired absence of right breast and nipple: Secondary | ICD-10-CM

## 2022-04-07 DIAGNOSIS — I480 Paroxysmal atrial fibrillation: Secondary | ICD-10-CM | POA: Diagnosis present

## 2022-04-07 DIAGNOSIS — K900 Celiac disease: Secondary | ICD-10-CM | POA: Diagnosis present

## 2022-04-07 DIAGNOSIS — Z90710 Acquired absence of both cervix and uterus: Secondary | ICD-10-CM

## 2022-04-07 DIAGNOSIS — R531 Weakness: Secondary | ICD-10-CM

## 2022-04-07 DIAGNOSIS — R296 Repeated falls: Secondary | ICD-10-CM | POA: Diagnosis present

## 2022-04-07 DIAGNOSIS — I7300 Raynaud's syndrome without gangrene: Secondary | ICD-10-CM | POA: Diagnosis present

## 2022-04-07 DIAGNOSIS — N3000 Acute cystitis without hematuria: Secondary | ICD-10-CM | POA: Diagnosis not present

## 2022-04-07 DIAGNOSIS — S92214A Nondisplaced fracture of cuboid bone of right foot, initial encounter for closed fracture: Secondary | ICD-10-CM | POA: Diagnosis present

## 2022-04-07 DIAGNOSIS — M810 Age-related osteoporosis without current pathological fracture: Secondary | ICD-10-CM | POA: Diagnosis present

## 2022-04-07 DIAGNOSIS — S42301A Unspecified fracture of shaft of humerus, right arm, initial encounter for closed fracture: Secondary | ICD-10-CM | POA: Diagnosis present

## 2022-04-07 DIAGNOSIS — E039 Hypothyroidism, unspecified: Secondary | ICD-10-CM | POA: Diagnosis present

## 2022-04-07 DIAGNOSIS — Z8249 Family history of ischemic heart disease and other diseases of the circulatory system: Secondary | ICD-10-CM

## 2022-04-07 DIAGNOSIS — Z803 Family history of malignant neoplasm of breast: Secondary | ICD-10-CM

## 2022-04-07 DIAGNOSIS — Z8673 Personal history of transient ischemic attack (TIA), and cerebral infarction without residual deficits: Secondary | ICD-10-CM

## 2022-04-07 DIAGNOSIS — N12 Tubulo-interstitial nephritis, not specified as acute or chronic: Secondary | ICD-10-CM | POA: Diagnosis present

## 2022-04-07 DIAGNOSIS — Z974 Presence of external hearing-aid: Secondary | ICD-10-CM

## 2022-04-07 DIAGNOSIS — D509 Iron deficiency anemia, unspecified: Secondary | ICD-10-CM | POA: Diagnosis present

## 2022-04-07 DIAGNOSIS — Z79890 Hormone replacement therapy (postmenopausal): Secondary | ICD-10-CM

## 2022-04-07 DIAGNOSIS — Z823 Family history of stroke: Secondary | ICD-10-CM

## 2022-04-07 DIAGNOSIS — I5032 Chronic diastolic (congestive) heart failure: Secondary | ICD-10-CM | POA: Diagnosis present

## 2022-04-07 DIAGNOSIS — F419 Anxiety disorder, unspecified: Secondary | ICD-10-CM | POA: Diagnosis present

## 2022-04-07 DIAGNOSIS — N3001 Acute cystitis with hematuria: Principal | ICD-10-CM

## 2022-04-07 DIAGNOSIS — R7303 Prediabetes: Secondary | ICD-10-CM | POA: Diagnosis present

## 2022-04-07 DIAGNOSIS — F32A Depression, unspecified: Secondary | ICD-10-CM | POA: Diagnosis present

## 2022-04-07 DIAGNOSIS — W19XXXA Unspecified fall, initial encounter: Secondary | ICD-10-CM | POA: Diagnosis present

## 2022-04-07 LAB — URINALYSIS, ROUTINE W REFLEX MICROSCOPIC
Bilirubin Urine: NEGATIVE
Glucose, UA: NEGATIVE mg/dL
Hgb urine dipstick: NEGATIVE
Ketones, ur: NEGATIVE mg/dL
Nitrite: POSITIVE — AB
Protein, ur: 30 mg/dL — AB
Specific Gravity, Urine: 1.021 (ref 1.005–1.030)
pH: 5 (ref 5.0–8.0)

## 2022-04-07 LAB — COMPREHENSIVE METABOLIC PANEL
ALT: 33 U/L (ref 0–44)
AST: 43 U/L — ABNORMAL HIGH (ref 15–41)
Albumin: 3.4 g/dL — ABNORMAL LOW (ref 3.5–5.0)
Alkaline Phosphatase: 128 U/L — ABNORMAL HIGH (ref 38–126)
Anion gap: 7 (ref 5–15)
BUN: 14 mg/dL (ref 8–23)
CO2: 24 mmol/L (ref 22–32)
Calcium: 8.4 mg/dL — ABNORMAL LOW (ref 8.9–10.3)
Chloride: 103 mmol/L (ref 98–111)
Creatinine, Ser: 0.66 mg/dL (ref 0.44–1.00)
GFR, Estimated: >60 mL/min (ref >60)
Glucose, Bld: 115 mg/dL — ABNORMAL HIGH (ref 70–99)
Potassium: 3.2 mmol/L — ABNORMAL LOW (ref 3.5–5.1)
Sodium: 134 mmol/L — ABNORMAL LOW (ref 135–145)
Total Bilirubin: 1.3 mg/dL — ABNORMAL HIGH (ref 0.3–1.2)
Total Protein: 6.9 g/dL (ref 6.5–8.1)

## 2022-04-07 LAB — IRON AND TIBC
Iron: 13 ug/dL — ABNORMAL LOW (ref 28–170)
Saturation Ratios: 4 % — ABNORMAL LOW (ref 10.4–31.8)
TIBC: 325 ug/dL (ref 250–450)
UIBC: 312 ug/dL

## 2022-04-07 LAB — RESP PANEL BY RT-PCR (FLU A&B, COVID) ARPGX2
Influenza A by PCR: NEGATIVE
Influenza B by PCR: NEGATIVE
SARS Coronavirus 2 by RT PCR: NEGATIVE

## 2022-04-07 LAB — CBC
HCT: 29.2 % — ABNORMAL LOW (ref 36.0–46.0)
Hemoglobin: 9.1 g/dL — ABNORMAL LOW (ref 12.0–15.0)
MCH: 26.6 pg (ref 26.0–34.0)
MCHC: 31.2 g/dL (ref 30.0–36.0)
MCV: 85.4 fL (ref 80.0–100.0)
Platelets: 228 10*3/uL (ref 150–400)
RBC: 3.42 MIL/uL — ABNORMAL LOW (ref 3.87–5.11)
RDW: 14.4 % (ref 11.5–15.5)
WBC: 7.2 10*3/uL (ref 4.0–10.5)
nRBC: 0 % (ref 0.0–0.2)

## 2022-04-07 LAB — FERRITIN: Ferritin: 87 ng/mL (ref 11–307)

## 2022-04-07 LAB — LACTIC ACID, PLASMA
Lactic Acid, Venous: 1 mmol/L (ref 0.5–1.9)
Lactic Acid, Venous: 1.8 mmol/L (ref 0.5–1.9)

## 2022-04-07 LAB — BRAIN NATRIURETIC PEPTIDE: B Natriuretic Peptide: 625.3 pg/mL — ABNORMAL HIGH (ref 0.0–100.0)

## 2022-04-07 MED ORDER — POTASSIUM CHLORIDE 20 MEQ PO PACK
40.0000 meq | PACK | Freq: Once | ORAL | Status: AC
  Administered 2022-04-07: 40 meq via ORAL
  Filled 2022-04-07: qty 2

## 2022-04-07 MED ORDER — FUROSEMIDE 10 MG/ML IJ SOLN
20.0000 mg | Freq: Once | INTRAMUSCULAR | Status: AC
  Administered 2022-04-07: 20 mg via INTRAVENOUS
  Filled 2022-04-07: qty 4

## 2022-04-07 MED ORDER — PANTOPRAZOLE SODIUM 40 MG PO TBEC
40.0000 mg | DELAYED_RELEASE_TABLET | Freq: Every day | ORAL | Status: DC
  Administered 2022-04-07 – 2022-04-09 (×3): 40 mg via ORAL
  Filled 2022-04-07 (×3): qty 1

## 2022-04-07 MED ORDER — POLYSACCHARIDE IRON COMPLEX 150 MG PO CAPS
150.0000 mg | ORAL_CAPSULE | Freq: Every day | ORAL | Status: DC
  Administered 2022-04-07 – 2022-04-09 (×3): 150 mg via ORAL
  Filled 2022-04-07 (×4): qty 1

## 2022-04-07 MED ORDER — ACETAMINOPHEN 325 MG PO TABS
650.0000 mg | ORAL_TABLET | Freq: Four times a day (QID) | ORAL | Status: DC | PRN
  Administered 2022-04-07 – 2022-04-09 (×2): 650 mg via ORAL
  Filled 2022-04-07 (×2): qty 2

## 2022-04-07 MED ORDER — TIMOLOL MALEATE 0.5 % OP SOLN
1.0000 [drp] | Freq: Two times a day (BID) | OPHTHALMIC | Status: DC
  Administered 2022-04-07 – 2022-04-09 (×4): 1 [drp] via OPHTHALMIC
  Filled 2022-04-07: qty 5

## 2022-04-07 MED ORDER — AMLODIPINE BESYLATE 5 MG PO TABS
5.0000 mg | ORAL_TABLET | Freq: Every day | ORAL | Status: DC
  Administered 2022-04-07 – 2022-04-09 (×3): 5 mg via ORAL
  Filled 2022-04-07 (×3): qty 1

## 2022-04-07 MED ORDER — LEVOTHYROXINE SODIUM 100 MCG PO TABS
100.0000 ug | ORAL_TABLET | Freq: Every day | ORAL | Status: DC
  Administered 2022-04-08 – 2022-04-09 (×2): 100 ug via ORAL
  Filled 2022-04-07 (×2): qty 1

## 2022-04-07 MED ORDER — SODIUM CHLORIDE 0.9 % IV SOLN
1.0000 g | Freq: Once | INTRAVENOUS | Status: AC
  Administered 2022-04-07: 1 g via INTRAVENOUS
  Filled 2022-04-07: qty 10

## 2022-04-07 MED ORDER — ENOXAPARIN SODIUM 40 MG/0.4ML IJ SOSY
40.0000 mg | PREFILLED_SYRINGE | INTRAMUSCULAR | Status: DC
  Administered 2022-04-07 – 2022-04-08 (×2): 40 mg via SUBCUTANEOUS
  Filled 2022-04-07 (×2): qty 0.4

## 2022-04-07 MED ORDER — SERTRALINE HCL 50 MG PO TABS
100.0000 mg | ORAL_TABLET | Freq: Every day | ORAL | Status: DC
  Administered 2022-04-08 – 2022-04-09 (×2): 100 mg via ORAL
  Filled 2022-04-07 (×2): qty 2

## 2022-04-07 MED ORDER — ALPRAZOLAM 0.5 MG PO TABS
0.5000 mg | ORAL_TABLET | Freq: Every evening | ORAL | Status: DC | PRN
  Administered 2022-04-07 – 2022-04-08 (×2): 0.5 mg via ORAL
  Filled 2022-04-07 (×2): qty 1

## 2022-04-07 MED ORDER — URELLE 81 MG PO TABS: 1.0000 | ORAL_TABLET | Freq: Three times a day (TID) | ORAL | Status: DC | PRN

## 2022-04-07 MED ORDER — POTASSIUM CHLORIDE CRYS ER 20 MEQ PO TBCR: 40.0000 meq | EXTENDED_RELEASE_TABLET | Freq: Once | ORAL | Status: DC

## 2022-04-07 MED ORDER — ATORVASTATIN CALCIUM 20 MG PO TABS
40.0000 mg | ORAL_TABLET | Freq: Every day | ORAL | Status: DC
  Administered 2022-04-07 – 2022-04-09 (×3): 40 mg via ORAL
  Filled 2022-04-07 (×4): qty 2

## 2022-04-07 MED ORDER — ONDANSETRON HCL 4 MG/2ML IJ SOLN: 4.0000 mg | Freq: Four times a day (QID) | INTRAMUSCULAR | Status: DC | PRN

## 2022-04-07 MED ORDER — LATANOPROST 0.005 % OP SOLN
1.0000 [drp] | Freq: Every day | OPHTHALMIC | Status: DC
  Administered 2022-04-07 – 2022-04-08 (×2): 1 [drp] via OPHTHALMIC
  Filled 2022-04-07: qty 2.5

## 2022-04-07 MED ORDER — TIMOLOL HEMIHYDRATE 0.5 % OP SOLN: 1.0000 [drp] | Freq: Two times a day (BID) | OPHTHALMIC | Status: DC

## 2022-04-07 MED ORDER — SODIUM CHLORIDE 0.9% FLUSH
3.0000 mL | Freq: Two times a day (BID) | INTRAVENOUS | Status: DC
  Administered 2022-04-07 – 2022-04-09 (×5): 3 mL via INTRAVENOUS

## 2022-04-07 MED ORDER — HYDROCODONE-ACETAMINOPHEN 5-325 MG PO TABS
1.0000 | ORAL_TABLET | Freq: Four times a day (QID) | ORAL | Status: DC | PRN
  Administered 2022-04-07 – 2022-04-08 (×2): 1 via ORAL
  Filled 2022-04-07 (×2): qty 1

## 2022-04-07 MED ORDER — SODIUM CHLORIDE 0.9 % IV SOLN
1.0000 g | INTRAVENOUS | Status: DC
  Administered 2022-04-08: 1 g via INTRAVENOUS
  Filled 2022-04-07 (×2): qty 10

## 2022-04-07 MED ORDER — METOPROLOL SUCCINATE ER 25 MG PO TB24
12.5000 mg | ORAL_TABLET | Freq: Every day | ORAL | Status: DC
  Administered 2022-04-07 – 2022-04-09 (×3): 12.5 mg via ORAL
  Filled 2022-04-07 (×3): qty 1

## 2022-04-07 MED ORDER — ACETAMINOPHEN 650 MG RE SUPP: 650.0000 mg | Freq: Four times a day (QID) | RECTAL | Status: DC | PRN

## 2022-04-07 MED ORDER — POLYETHYLENE GLYCOL 3350 17 G PO PACK
17.0000 g | PACK | Freq: Every day | ORAL | Status: DC
  Administered 2022-04-07 – 2022-04-08 (×2): 17 g via ORAL
  Filled 2022-04-07 (×3): qty 1

## 2022-04-07 MED ORDER — ONDANSETRON HCL 4 MG PO TABS: 4.0000 mg | ORAL_TABLET | Freq: Four times a day (QID) | ORAL | Status: DC | PRN

## 2022-04-07 NOTE — H&P (Signed)
History and Physical    Patient: Sandra [name] XBW:[id number] DOB: 1946-04-06 DOA: 04/07/2022 DOS: the patient was seen and examined on 04/07/2022 PCP: Waylan Rocher, MD  Patient coming from: Home  Chief Complaint:  Chief Complaint  Patient presents with   Fever   HPI: [name] is a 76 y.o. female with medical history significant of HFpEF with a EF of 60-65%, paroxysmal atrial fibrillation s/p watchman's, hemorrhagic CVA, nonobstructive CAD, celiac disease, hypothyroidism, Raynaud's, prediabetes, who presents to the ED with complaints of fever.  [name] was recently admitted from 11/4 -11/6 due to Recurrent falls complicated by nondisplaced fracture of cuboid bone of right foot and right comminuted distal radius and ulnar fracture in addition to dizziness.  She was discharged home with a cam boot and arm splint, in addition to a Zio patch to evaluate for atrial fibrillation.  [name] states that since she has been home, she has not been feeling well.  She continues to have dizziness when standing and worsening dyspnea on exertion.  In addition, she has been experiencing dysuria, urinary urgency, and urinary frequency.  She had a telehealth visit with her PCP and they prescribed Macrobid. Despite starting Macrobid approximately 2 days ago, [name] has been experiencing fever and chills.  This morning, her temperature was approximately 102.  She continues to experience urgency and frequency, but dysuria has been improving with Azo's.  ED course: On arrival to the ED, patient was afebrile at 97.9 with blood pressure of 114/69 and heart rate of 72.  She was saturating at 95% on room air.  Initial work-up remarkable for potassium 3.2, hemoglobin of 9.1.  Urinalysis obtained with amber color, trace leukocytes, positive nitrites and rare bacteria present.  CT renal study was obtained that showed small renal stones but no evidence of pyelonephritis.  Patient was started on  ceftriaxone and TR[facility] contacted for admission.  Review of Systems: As mentioned in the history of present illness. All other systems reviewed and are negative. This is Past Medical History:  Diagnosis Date   (HFpEF) heart failure with preserved ejection fraction ([place])    a. 08/2019 Echo: EF 60-65%, no rwma, mild LVH, Gr2 DD, nl RV fxn. Nl pASP. Mildly dil LA. Mild to mod MR.   Anemia    Anxiety    Arthritis    BRCA gene mutation negative 10/2016   NEGATIVE: [employer]   Breast cancer ([place]) 10/16/2016   T1c,N0;, GRADE I/III, 1.6cm. ER/PR pos  HER2 not over expressed, Right Upper Outer   Celiac disease    Celiac syndrome    Cerebellar hemorrhage, acute ([place]) 07/17/2020   Depression    Dyspnea    WITH EXERTION   Fatty liver    GERD (gastroesophageal reflux disease)    OCC   Glaucoma    right eye   Headache    H/O MIGRAINES AS TEENAGER.   Heart murmur    a. 08/2019 Echo: mild to mod MR.   Hypertension    Hypothyroidism    Non-obstructive CAD (coronary artery disease)    a. 01/2017 MV: Hypertensive response. No ischemia/infarct. EF >65%; b. 09/2019 Cor CTA: Ca2+ = 9.29 (36th percentile). LAD calcified plaque (0-24%), otw nl. Multipel bilat pulm nodules up to 91m.   Osteoporosis    PAF (paroxysmal atrial fibrillation) ([place]    a. 02/2020 Zio: predominantly RSR, 65 (50-105), rare PACs/PVCs, 8 beats NSVT, multiple episodes of PAF lasting up to 1hr 238ms. Avg AF rate 130 (93-170).  AF burden <1%. Triggered events = RSR, PACs, and PAF; b. CHA2DS2VASc = 6.   Pneumonia    YEARS AGO   Pre-diabetes    Presence of Watchman left atrial appendage closure device 01/2021   Pulmonary nodules    a. 09/2019 Cor CTA: incidental finding of multiple bilat pulm nodules; b. 12/2019 High Res CT: stable, scattered solid pulm nodules.   Raynaud's disease    RLS (restless legs syndrome)    Stroke ([place]) 06/2020   mild balance issues   Wears hearing aid in both ears    Past Surgical History:  Procedure  Laterality Date   ABDOMINAL HYSTERECTOMY     APPENDECTOMY     AXILLARY LYMPH NODE BIOPSY Right 03/05/2017   Procedure: AXILLARY LYMPH NODE BIOPSY;  Surgeon: Robert Bellow, MD;  Location: [facility] ORS;  Service: General;  Laterality: Right;   CATARACT EXTRACTION Bilateral    CESAREAN SECTION     COLONOSCOPY WITH PROPOFOL N/A 05/19/2015   Procedure: COLONOSCOPY WITH PROPOFOL;  Surgeon: Manya Silvas, MD;  Location: [facility] END[facility]COPY;  Service: Endoscopy;  Laterality: N/A;   COLONOSCOPY WITH PROPOFOL N/A 08/01/2019   Procedure: COLONOSCOPY WITH PROPOFOL;  Surgeon: Lucilla Lame, MD;  Location: [facility] ENDOSCOPY;  Service: Endoscopy;  Laterality: N/A;   EYE SURGERY     HAND SURGERY Left    LEFT ATRIAL APPENDAGE OCCLUSION N/A 01/27/2021   Procedure: LEFT ATRIAL APPENDAGE OCCLUSION;  Surgeon: Vickie Epley, MD;  Location: [place] CV LAB;  Service: Cardiovascular;  Laterality: N/A;   MASTECTOMY Right 11/07/2016   RESIDUAL INVASIVE MAMMARY CARCINOMA, SUBAREOLAR ANTERIOR TO PREVIOUS    SENTINEL NODE BIOPSY Right 11/07/2016   Procedure: SENTINEL NODE BIOPSY;  Surgeon: Robert Bellow, MD;  Location: [facility] ORS;  Service: General;  Laterality: Right;   SIMPLE MASTECTOMY WITH AXILLARY SENTINEL NODE BIOPSY Right 11/07/2016   6 mm ER/PR 100%; Her 2 neu not overexpressed, T1b, N0.  Surgeon: Robert Bellow, MD;  Location: [facility] ORS;  Service: General;  Laterality: Right;   TEE WITHOUT CARDIOVERSION N/A 01/27/2021   Procedure: TRANSESOPHAGEAL ECHOCARDIOGRAM (TEE);  Surgeon: Vickie Epley, MD;  Location: [place] [facility];  Service: Cardiovascular;  Laterality: N/A;   TEE WITHOUT CARDIOVERSION N/A 03/10/2021   Procedure: TRANSESOPHAGEAL ECHOCARDIOGRAM (TEE);  Surgeon: Elouise Munroe, MD;  Location: [place];  Service: Cardiology;  Laterality: N/A;   TONSILLECTOMY  AGE 60   UPPER GI ENDOSCOPY  01/12/06   hiatus hernia   Social History:  reports that she has never smoked. She has never used  smokeless tobacco. She reports that she does not currently use alcohol. She reports that she does not use drugs.  Allergies  Allergen Reactions   Tape Rash    Family History  Problem Relation Age of Onset   Cancer Mother    Anemia Mother    Other Mother        lymphosarcoma   Alcohol abuse Father    Prostate cancer Father    Breast cancer Sister        46; 1/2 sister, shared mother.   Cancer Sister        bile duct   Stroke Maternal Grandmother    Breast cancer Maternal Grandmother 68   Heart attack Maternal Grandfather    CAD Paternal Grandfather     Prior to Admission medications   Medication Sig Start Date End Date Taking? Authorizing Provider  acetaminophen (TYLENOL) 325 MG tablet Take 2 tablets (650 mg total) by  mouth every 6 (six) hours as needed for mild pain (or Fever >/= 101). 04/03/22   Rai, Vernelle Emerald, MD  ALPRAZolam Duanne Moron) 0.5 MG tablet Take 1 tablet (0.5 mg total) by mouth at bedtime as needed. for sleep 03/24/22   Birdie Sons, MD  amLODipine (NORVASC) 10 MG tablet TAKE 1 TABLET BY MOUTH  DAILY 07/12/21   Jerrol Banana., MD  aspirin EC 81 MG tablet Take 1 tablet (81 mg total) by mouth daily. Swallow whole. 07/27/21   Vickie Epley, MD  atorvastatin (LIPITOR) 40 MG tablet TAKE 1 TABLET BY MOUTH DAILY 09/02/21   Rise Mu, PA-C  bisacodyl 5 MG EC tablet Take 5 mg by mouth daily as needed for moderate constipation.    [provider]  Calcium Carb-Cholecalciferol (CALCIUM 600 + D PO) Take 1 tablet by mouth daily.    [provider]  Cholecalciferol (VITAMIN D3) 125 MCG (5000 UT) TABS Take 5,000 Units by mouth daily.    [provider]  docusate sodium (COLACE) 100 MG capsule Take 100 mg by mouth daily as needed for mild constipation.    [provider]  hydroxypropyl methylcellulose / hypromellose (ISOPTO TEARS / GONIOVISC) 2.5 % ophthalmic solution Place 1 drop into both eyes 4 (four) times daily as needed for dry  eyes.    [provider]  latanoprost (XALATAN) 0.005 % ophthalmic solution Place 1 drop into both eyes at bedtime.    [provider]  levothyroxine (SYNTHROID) 100 MCG tablet TAKE 1 TABLET BY MOUTH 30  MINUTES BEFORE BREAKFAST 02/02/22   Jerrol Banana., MD  metoprolol succinate (TOPROL-XL) 25 MG 24 hr tablet Take 1 tablet (25 mg total) by mouth daily. Patient taking differently: Take 12.5 mg by mouth daily. May take additional 12.5 mg for A Fib 04/03/22   Rai, Ripudeep K, MD  Multiple Vitamin (MULTIVITAMIN WITH MINERALS) TABS tablet Take 1 tablet by mouth daily at 3 pm.    [provider]  omeprazole (PRILOSEC) 40 MG capsule TAKE 1 CAPSULE BY MOUTH DAILY 02/02/22   Jerrol Banana., MD  polyethylene glycol (MIRALAX / GLYCOLAX) 17 g packet Take 17 g by mouth daily as needed for moderate constipation.    [provider]  potassium chloride SA (KLOR-CON M) 20 MEQ tablet Take 1 tablet (20 mEq total) by mouth daily. Patient not taking: Reported on 04/04/2022 03/24/22   End, Harrell Gave, MD  sertraline (ZOLOFT) 100 MG tablet TAKE 1 TABLET BY MOUTH DAILY 12/05/21   Jerrol Banana., MD  timolol (BETIMOL) 0.5 % ophthalmic solution Place 1 drop into both eyes 2 (two) times daily.    [provider]  traMADol (ULTRAM) 50 MG tablet Take 1-2 tablets (50-100 mg total) by mouth every 8 (eight) hours as needed for up to 5 days for moderate pain or severe pain. 04/03/22 04/08/22  Rai, Ripudeep Raliegh Ip, MD  Urelle (URELLE/URISED) 81 MG TABS tablet Take 1 tablet (81 mg total) by mouth every 8 (eight) hours as needed for bladder spasms. 04/03/22   [name], MD    Physical Exam: Vitals:   04/07/22 0957 04/07/22 0958 04/07/22 1400 04/07/22 1444  BP:  114/69  (!) 142/62  Pulse:  72  76  Resp:  18  18  Temp:  97.9 F (36.6 C) 98.3 F (36.8 C) 98.5 F (36.9 C)  TempSrc:  Oral Oral Oral  SpO2:  95%  93%  Weight: 60.3 kg  Height: _0  (1.6 m)       Physical Exam Vitals and nursing note reviewed.  Constitutional:      General: She is not in acute distress.    Appearance: She is normal weight.  HENT:     Head: Normocephalic and atraumatic.     Mouth/Throat:     Mouth: Mucous membranes are moist.  Eyes:     General: No scleral icterus.    Extraocular Movements: Extraocular movements intact.     Conjunctiva/sclera: Conjunctivae normal.  Neck:     Vascular: No JVD.  Cardiovascular:     Rate and Rhythm: Normal rate and regular rhythm.     Heart sounds: Murmur (3/6 holosystolic murmur heard best at the left upper sternal border) heard.  Pulmonary:     Effort: Pulmonary effort is normal. No tachypnea or accessory muscle usage.     Breath sounds: Rales (Bibasilar fine crackles) present. No decreased breath sounds, wheezing or rhonchi.  Abdominal:     General: There is no distension.     Palpations: Abdomen is soft.     Tenderness: There is abdominal tenderness (Suprapubic). There is right CVA tenderness and left CVA tenderness. There is no guarding.  Musculoskeletal:     Right lower leg: No edema.     Left lower leg: No edema.  Skin:    General: Skin is warm and dry.     Coloration: Skin is pale.     Findings: Bruising (Medial aspect of the right upper extremity above where splint is wrapped) present.  Neurological:     General: No focal deficit present.     Mental Status: She is alert and oriented to person, place, and time.  Psychiatric:        Mood and Affect: Mood normal.        Behavior: Behavior normal.    Data Reviewed: CBC with hemoglobin of 9.1 with MCV of 85.  No leukocytosis or thrombocytopenia.   CMP with a sodium of 134, potassium of 3.2, glucose of 115, calcium of 8.4, AST of 43, alkaline phosphatase of 128 and total bilirubin of 1.3.  Lactic acid within normal limits.   Respiratory viral panel including COVID-19 and influenza negative.  Urinalysis with proteinuria, positive nitrites, trace leukocytes, 11-20  RBCs/hpf, 21-50 WBCs/hpf and rare bacteria. BNP elevated at 625.  Previously 243.   Iron saturation quite low at 4%. Ferritin normal at 87.  Urine culture obtained on 11/5 grew E. coli sensitive to ceftriaxone.  DG Chest Port 1 View  Result Date: 04/07/2022 CLINICAL DATA:  Fever and possible UTI. History of breast cancer. EXAM: PORTABLE CHEST 1 VIEW COMPARISON:  December 30, 2020 FINDINGS: Electronic apparatus overlie the superior heart border. Cardiomediastinal silhouette is normal. Mediastinal contours appear intact. Calcific atherosclerotic disease and tortuosity of the aorta. There is no evidence of focal airspace consolidation, pleural effusion or pneumothorax. Mild coarsening of interstitial markings. Osseous structures are without acute abnormality. Soft tissues are grossly normal. IMPRESSION: 1. Mild coarsening of the interstitial markings, which may be seen with pulmonary vascular congestion. 2. No definite focal airspace consolidation. Electronically Signed   By: Fidela Salisbury M.D.   On: 04/07/2022 13:04   CT Renal Stone Study  Result Date: 04/07/2022 CLINICAL DATA:  Flank pain. EXAM: CT ABDOMEN AND PELVIS WITHOUT CONTRAST TECHNIQUE: Multidetector CT imaging of the abdomen and pelvis was performed following the standard protocol without IV contrast. RADIATION DOSE REDUCTION: This exam was performed according to the departmental dose-optimization program  which includes automated exposure control, adjustment of the mA and/or kV according to patient size and/or use of iterative reconstruction technique. COMPARISON:  November 26, 2019 FINDINGS: Lower chest: No acute abnormality. Hepatobiliary: No focal liver abnormality is seen. No gallstones, gallbladder wall thickening, or biliary dilatation. Pancreas: Unremarkable. No pancreatic ductal dilatation or surrounding inflammatory changes. Spleen: Normal in size without focal abnormality. Adrenals/Urinary Tract: Normal adrenal glands. Bilateral  nonobstructive renal calculi. 2 mm nonobstructive calculus in the lower pole the right kidney. Four nonobstructive calculi in the left kidney with the largest measuring 4 mm in the lower pole. Stomach/Bowel: Stomach is within normal limits. Appendix appears normal. No evidence of bowel wall thickening, distention, or inflammatory changes. Vascular/Lymphatic: Aortic atherosclerosis. No enlarged abdominal or pelvic lymph nodes. Reproductive: Status post hysterectomy. No adnexal masses. Other: Focal mesenteric stranding with a few sub pathologic by CT criteria lymph nodes in the left central abdomen. The findings are best appreciated on the coronal view, image 23-40 7/92. Musculoskeletal: Spondylosis of the lower lumbosacral spine. IMPRESSION: 1. Bilateral nonobstructive renal calculi. 2. Focal mesenteric stranding with a few interspersed sub pathologic by CT criteria lymph nodes in the left central abdomen. This finding is nonspecific, but may represent mesenteric lymphadenitis or panniculitis. 3. Aortic atherosclerosis. Aortic Atherosclerosis (ICD10-I70.0). Electronically Signed   By: Fidela Salisbury M.D.   On: 04/07/2022 11:25     There are no new results to review at this time.  Assessment and Plan: * Acute pyelonephritis Patient presenting with several day history of dysuria, frequency and urgency with urinalysis consistent with urinary tract infection.  She has been on Macrobid for 2 days without significant improvement in her symptoms.  On examination, she has bilateral CVA tenderness that is worse on the left than the right.  Given this and her fevers at home, this is more consistent with acute pyelonephritis, and explains why she has not improved with Macrobid.  CT renal study was negative although low sensitivity given noncontrasted study.  - Ceftriaxone 1 g every 24 hours - Transition to oral agent with plan to complete 10 to 14-day course once clinically improving  Acute on chronic heart  failure with preserved ejection fraction (HFpEF) ([place]) Last echo in 2022 with preserved EF at 60-65%.  Patient presenting with worsening dyspnea on exertion with chest x-ray concerning for vascular congestion.  BNP elevated compared to prior at 625.  Although there is concern for dehydration given fevers at home and poor p.o. intake, her creatinine is normal at this time.  Given she is experiencing respiratory symptoms, will trial a low-dose of Lasix.  - Lasix 20 mg IV once - Strict in and out - Daily weights - Continue home metoprolol  Normocytic anemia Patient presenting with gradually decreasing hemoglobin today at 9.1.  Iron panel with evidence of iron deficiency.  No evidence of bleeding on examination today.  - Start iron polysaccharide - Trend CBC while admitted - Transfuse for hemoglobin less than 7  Dizziness Patient reports a history of dizziness when standing and recurrent falls.  During most recent admission, telemetry did not show any episodes of atrial fibrillation and patient was discharged with a Zio patch.  There is still concerned that her atrial fibrillation is contributing to falls and cardiology did consider starting amiodarone.   - Telemetry monitoring while admitted - Requested patient to inform RN if any dizziness occurs. - Orthostatic vital signs  PAF (paroxysmal atrial fibrillation) ([facility]) - Telemetry monitoring while admitted - Continue low-dose metoprolol  Right  arm fracture Patient has scheduled open reduction scheduled for 11/14 with Dr. [name].  Patient and her daughter would like to inquire if surgery can be scheduled while she is currently admitted.  Will look into this further.  - Norco for pain control - Discontinue tramadol given patient is on high dose sertraline  Hypothyroidism - Continue home Synthroid  Advance Care Planning:   Code Status: Full Code. Confirmed with patient at bedside.  Consults: None  Family Communication: Patient's daughter  updated at bedside  Severity of Illness: The appropriate patient status for this patient is OBSERVATION. Observation status is judged to be reasonable and necessary in order to provide the required intensity of service to ensure the patient's safety. The patient's presenting symptoms, physical exam findings, and initial radiographic and laboratory data in the context of their medical condition is felt to place them at decreased risk for further clinical deterioration. Furthermore, it is anticipated that the patient will be medically stable for discharge from the hospital within 2 midnights of admission.   Author: Jose Persia, MD 04/07/2022 3:29 PM  For on call review [email or web address].

## 2022-04-07 NOTE — Assessment & Plan Note (Addendum)
Patient has scheduled open reduction scheduled for 11/14 with Dr. [name].  Patient and her daughter would like to inquire if surgery can be scheduled while she is currently admitted.  Will look into this further.  - Norco for pain control - Discontinue tramadol given patient is on high dose sertraline

## 2022-04-07 NOTE — Assessment & Plan Note (Signed)
-   Telemetry monitoring while admitted - Continue low-dose metoprolol

## 2022-04-07 NOTE — ED Triage Notes (Addendum)
First Nurse: Pt here via [facility] with a fever and possible UTI. Pt has a right arm and leg fracture from a fall last Friday. Hx of breast cancer and HF. Pt is currently being treated with Macrobid for a UTI.   99.0 116/67 95% RA 75

## 2022-04-07 NOTE — Assessment & Plan Note (Signed)
-   Continue home Synthroid °

## 2022-04-07 NOTE — ED Provider Notes (Signed)
[facility] Provider Note    Event Date/Time   First MD Initiated Contact with Patient 04/07/22 1033     (approximate)   History   Fever   HPI  Sandra [name] is a 76 y.o. female who presents with fevers and chills.  Patient reports she was scheduled for surgery today for a broken right wrist with Dr. [name].  However overnight she had diffuse chills and a temperature of 102 this morning per daughter.  She has had dysuria and urinary frequency over the last several days and only started Macrobid 2 days ago with no improvement.  Reports some left-sided back pain.  No vomiting.     Physical Exam   Triage Vital Signs: ED Triage Vitals  Enc Vitals Group     BP 04/07/22 0958 114/69     Pulse Rate 04/07/22 0958 72     Resp 04/07/22 0958 18     Temp 04/07/22 0958 97.9 F (36.6 C)     Temp Source 04/07/22 0958 Oral     SpO2 04/07/22 0958 95 %     Weight 04/07/22 0957 60.3 kg (132 lb 15 oz)     Height 04/07/22 0957 1.6 m (5' 3" )     Head Circumference --      Peak Flow --      Pain Score 04/07/22 0957 5     Pain Loc --      Pain Edu? --      Excl. in [place]? --     Most recent vital signs: Vitals:   04/07/22 0958  BP: 114/69  Pulse: 72  Resp: 18  Temp: 97.9 F (36.6 C)  SpO2: 95%     General: Awake, no distress.  CV:  Good peripheral perfusion.  Resp:  Normal effort.  Abd:  No distention.  Questionable left CVA tenderness Other:     ED Results / Procedures / Treatments   Labs (all labs ordered are listed, but only abnormal results are displayed) Labs Reviewed  CBC - Abnormal; Notable for the following components:      Result Value   RBC 3.42 (*)    Hemoglobin 9.1 (*)    HCT 29.2 (*)    All other components within normal limits  COMPREHENSIVE METABOLIC PANEL - Abnormal; Notable for the following components:   Sodium 134 (*)    Potassium 3.2 (*)    Glucose, Bld 115 (*)    Calcium 8.4 (*)    Albumin 3.4 (*)    AST 43 (*)     Alkaline Phosphatase 128 (*)    Total Bilirubin 1.3 (*)    All other components within normal limits  URINALYSIS, ROUTINE W REFLEX MICROSCOPIC - Abnormal; Notable for the following components:   Color, Urine AMBER (*)    APPearance CLEAR (*)    Protein, ur 30 (*)    Nitrite POSITIVE (*)    Leukocytes,Ua TRACE (*)    Bacteria, UA RARE (*)    All other components within normal limits  RESP PANEL BY RT-PCR (FLU A&B, COVID) ARPGX2  CULTURE, BLOOD (SINGLE)  LACTIC ACID, PLASMA  LACTIC ACID, PLASMA  FERRITIN  IRON AND TIBC  BRAIN NATRIURETIC PEPTIDE     EKG     RADIOLOGY CT renal stone study viewed by me, no ureterolithiasis    PROCEDURES:  Critical Care performed:   Procedures   MEDICATIONS ORDERED IN ED: Medications  enoxaparin (LOVENOX) injection 40 mg (has no administration in time  range)  sodium chloride flush (NS) 0.9 % injection 3 mL (has no administration in time range)  acetaminophen (TYLENOL) tablet 650 mg (has no administration in time range)    Or  acetaminophen (TYLENOL) suppository 650 mg (has no administration in time range)  polyethylene glycol (MIRALAX / GLYCOLAX) packet 17 g (has no administration in time range)  ondansetron (ZOFRAN) tablet 4 mg (has no administration in time range)    Or  ondansetron (ZOFRAN) injection 4 mg (has no administration in time range)  cefTRIAXone (ROCEPHIN) 1 g in sodium chloride 0.9 % 100 mL IVPB (0 g Intravenous Stopped 04/07/22 1228)     IMPRESSION / MDM / ASSESSMENT AND PLAN / ED COURSE  I reviewed the triage vital signs and the nursing notes. Patient's presentation is most consistent with acute presentation with potential threat to life or bodily function.  Patient presents with fever and chills as detailed above.  Differential includes UTI/pyelonephritis/sepsis, viral syndrome  Urinalysis is suspicious for urinary tract infection, will start IV Rocephin, obtain CT renal stone study to evaluate for  ureterolithiasis/pyelonephritis  We will send COVID flu swab as well.  Lab work reviewed and is overall reassuring, normal white blood cell count, pending lactic acid.  Lactic acid is normal  CT scan not c/w pyelo  Given presentation with rigors, chills, fever and failed outpatient treatment of UTI, have discussed with hospitalist for admission      FINAL CLINICAL IMPRESSION(S) / ED DIAGNOSES   Final diagnoses:  Acute cystitis with hematuria  Generalized weakness     Rx / DC Orders   ED Discharge Orders     None        Note:  This document was prepared using Dragon voice recognition software and may include unintentional dictation errors.   Lavonia Drafts, MD 04/07/22 [phone]

## 2022-04-07 NOTE — Assessment & Plan Note (Signed)
Last echo in 2022 with preserved EF at 60-65%.  Patient presenting with worsening dyspnea on exertion with chest x-ray concerning for vascular congestion.  BNP elevated compared to prior at 625.  Although there is concern for dehydration given fevers at home and poor p.o. intake, her creatinine is normal at this time.  Given she is experiencing respiratory symptoms, will trial a low-dose of Lasix.  - Lasix 20 mg IV once - Strict in and out - Daily weights - Continue home metoprolol

## 2022-04-07 NOTE — Assessment & Plan Note (Signed)
Recurrent falls Patient reports a history of dizziness when standing and recurrent falls.  During most recent admission, telemetry did not show any episodes of atrial fibrillation and patient was discharged with a Zio patch.   --PT/OT

## 2022-04-07 NOTE — Assessment & Plan Note (Signed)
Patient presenting with several day history of dysuria, frequency and urgency with urinalysis consistent with urinary tract infection.  She has been on Macrobid for 2 days without significant improvement in her symptoms.  On examination, she has bilateral CVA tenderness that is worse on the left than the right.  Given this and her fevers at home, this is more consistent with acute pyelonephritis, and explains why she has not improved with Macrobid.  CT renal study was negative although low sensitivity given noncontrasted study.  - Ceftriaxone 1 g every 24 hours - Transition to oral agent with plan to complete 10 to 14-day course once clinically improving

## 2022-04-07 NOTE — Assessment & Plan Note (Signed)
Patient presenting with gradually decreasing hemoglobin today at 9.1.  Iron panel with evidence of iron deficiency.  No significant def in Vit B 12 and folate.  No evidence of bleeding.   Plan: --start IV iron --discharge on oral iron supplement

## 2022-04-08 DIAGNOSIS — N10 Acute tubulo-interstitial nephritis: Secondary | ICD-10-CM | POA: Diagnosis not present

## 2022-04-08 LAB — CBC
HCT: 28 % — ABNORMAL LOW (ref 36.0–46.0)
Hemoglobin: 8.9 g/dL — ABNORMAL LOW (ref 12.0–15.0)
MCH: 26.7 pg (ref 26.0–34.0)
MCHC: 31.8 g/dL (ref 30.0–36.0)
MCV: 84.1 fL (ref 80.0–100.0)
Platelets: 239 10*3/uL (ref 150–400)
RBC: 3.33 MIL/uL — ABNORMAL LOW (ref 3.87–5.11)
RDW: 14.5 % (ref 11.5–15.5)
WBC: 6.1 10*3/uL (ref 4.0–10.5)
nRBC: 0 % (ref 0.0–0.2)

## 2022-04-08 LAB — COMPREHENSIVE METABOLIC PANEL
ALT: 44 U/L (ref 0–44)
AST: 59 U/L — ABNORMAL HIGH (ref 15–41)
Albumin: 2.9 g/dL — ABNORMAL LOW (ref 3.5–5.0)
Alkaline Phosphatase: 159 U/L — ABNORMAL HIGH (ref 38–126)
Anion gap: 5 (ref 5–15)
BUN: 16 mg/dL (ref 8–23)
CO2: 26 mmol/L (ref 22–32)
Calcium: 8.2 mg/dL — ABNORMAL LOW (ref 8.9–10.3)
Chloride: 107 mmol/L (ref 98–111)
Creatinine, Ser: 0.67 mg/dL (ref 0.44–1.00)
GFR, Estimated: >60 mL/min (ref >60)
Glucose, Bld: 102 mg/dL — ABNORMAL HIGH (ref 70–99)
Potassium: 3.6 mmol/L (ref 3.5–5.1)
Sodium: 138 mmol/L (ref 135–145)
Total Bilirubin: 1 mg/dL (ref 0.3–1.2)
Total Protein: 6.2 g/dL — ABNORMAL LOW (ref 6.5–8.1)

## 2022-04-08 LAB — HEMOGLOBIN A1C
Hgb A1c MFr Bld: 6 % — ABNORMAL HIGH (ref 4.8–5.6)
Mean Plasma Glucose: 125.5 mg/dL

## 2022-04-08 MED ORDER — SODIUM CHLORIDE 0.9 % IV SOLN
200.0000 mg | Freq: Every day | INTRAVENOUS | Status: DC
  Administered 2022-04-08: 200 mg via INTRAVENOUS
  Filled 2022-04-08: qty 200
  Filled 2022-04-08: qty 10

## 2022-04-08 MED ORDER — TRAMADOL HCL 50 MG PO TABS
50.0000 mg | ORAL_TABLET | Freq: Four times a day (QID) | ORAL | Status: DC | PRN
  Administered 2022-04-08 – 2022-04-09 (×2): 50 mg via ORAL
  Filled 2022-04-08 (×2): qty 1

## 2022-04-08 NOTE — Evaluation (Addendum)
Physical Therapy Evaluation Patient Details Name: Sandra [name] MRN: [id number] DOB: 03/11/1946 Today's Date: 04/08/2022  History of Present Illness  Sandra [name] is a 76 y.o. female with medical history significant of HFpEF with a EF of 60-65%, paroxysmal atrial fibrillation s/p watchman's, hemorrhagic CVA, nonobstructive CAD, celiac disease, hypothyroidism, Raynaud's, prediabetes, who presents to the ED with complaints of fever in the setting of pyelonephritis. She was recently admitted from 11/4 -11/6 due to recurrent falls complicated by nondisplaced fracture of cuboid bone of right foot and right comminuted distal radius and ulnar fracture.  Clinical Impression  Pt presents seated in bed with nurse tech present, alert and oriented and agreeable to PT. She shows decreased mobility and dynamic balance requiring min guard periodically because of loss of balance to right side because of pain in right foot. She reports that her right foot pain is likely causing most of her balance issues, but that she has also experienced falls and difficulty with her balance since having a CVA. Given that pt is primary caregiver for her husband and lives with only him, she cannot safely go home and receive the assistance she needs. Daughter is currently present to assist with upcoming wrist surgery, but she cannot provide continued 24/7 assistance after pt discharge. Recommending additional skilled PT at SNF to address aforementioned deficits to decrease pt's risk for falls and to have her return to her prior level of function to care for husband safely.        Recommendations for follow up therapy are one component of a multi-disciplinary discharge planning process, led by the attending physician.  Recommendations may be updated based on patient status, additional functional criteria and insurance authorization.  Follow Up Recommendations Skilled nursing-short term rehab (<3 hours/day) Can patient physically be  transported by private vehicle: Yes    Assistance Recommended at Discharge Frequent or constant Supervision/Assistance  Patient can return home with the following  A little help with walking and/or transfers;A little help with bathing/dressing/bathroom;Assist for transportation;Help with stairs or ramp for entrance    Equipment Recommendations    Recommendations for Other Services       Functional Status Assessment Patient has had a recent decline in their functional status and demonstrates the ability to make significant improvements in function in a reasonable and predictable amount of time.     Precautions / Restrictions Precautions Precautions: Fall Precaution Comments: Camboot for RLE, RUE in splint Restrictions Weight Bearing Restrictions: Yes RUE Weight Bearing: Non weight bearing RLE Weight Bearing: Weight bearing as tolerated      Mobility  Bed Mobility Overal bed mobility: Modified Independent             General bed mobility comments: Increased time for setup    Transfers Overall transfer level: Modified independent Equipment used: Quad cane Transfers: Sit to/from Stand Sit to Stand: Supervision                Ambulation/Gait Ambulation/Gait assistance: Herbalist (Feet): 100 Feet Assistive device: Quad cane Gait Pattern/deviations: Step-to pattern, Decreased stance time - right, Decreased step length - left Gait velocity: decreased velocity     General Gait Details: Pt lost balance to left several times requiring PT to provide CGA  for her to regain balance  Stairs Stairs: Yes Stairs assistance: Min guard Stair Management: One rail Left, Step to pattern, Sideways Number of Stairs: 2 General stair comments: Difficulty tolerating pressure on RLE going down requiring intermittent CGA  Wheelchair Mobility  Modified Rankin (Stroke Patients Only)       Balance Overall balance assessment: Modified  Independent Sitting-balance support: Feet supported, Single extremity supported Sitting balance-Leahy Scale: Good Sitting balance - Comments: Min guard for static sitting balance   Standing balance support: Single extremity supported Standing balance-Leahy Scale: Good                               Pertinent Vitals/Pain Pain Assessment Pain Assessment: 0-10 Pain Score: 4  Pain Location: R foot Pain Descriptors / Indicators: Aching, Dull Pain Intervention(s): Monitored during session, Limited activity within patient's tolerance    Home Living Family/patient expects to be discharged to:: Private residence Living Arrangements: Spouse/significant other Available Help at Discharge: Family;Available PRN/intermittently;Friend(s) Type of Home: House Home Access: Stairs to enter Entrance Stairs-Rails: Right Entrance Stairs-Number of Steps: 2 Alternate Level Stairs-Number of Steps: 14 Home Layout: Two level;Able to live on main level with bedroom/bathroom Home Equipment: Rollator (4 wheels);Cane - quad;BSC/3in1 Additional Comments: Daughter is visiting but lives in [place]; assist from granddaughter who lives nearby PRN    Prior Function Prior Level of Function : Independent/Modified Independent             Mobility Comments: furniture cruises at home. Reports multiple recent falls. Reports falls started happening since CVA ADLs Comments: Independent with ADLs. Caregiver for husband, completes all IADLs     Hand Dominance   Dominant Hand: Right    Extremity/Trunk Assessment   Upper Extremity Assessment Upper Extremity Assessment: RUE deficits/detail RUE Deficits / Details: in splint RUE: Unable to fully assess due to immobilization    Lower Extremity Assessment Lower Extremity Assessment: RLE deficits/detail RLE Deficits / Details: Swelling from prior cuboid fracture along with TTP. RLE Sensation: WNL RLE Coordination: WNL    Cervical / Trunk  Assessment Cervical / Trunk Assessment: Normal  Communication   Communication: HOH  Cognition Arousal/Alertness: Awake/alert Behavior During Therapy: WFL for tasks assessed/performed Overall Cognitive Status: Within Functional Limits for tasks assessed                                 General Comments: Reports that she is forgetful at baseline ever since having CVA        General Comments General comments (skin integrity, edema, etc.): Difficulty with balance due more to pain in RLE    Exercises     Assessment/Plan    PT Assessment Patient needs continued PT services  PT Problem List Decreased strength;Decreased mobility;Pain;Decreased balance;Decreased safety awareness       PT Treatment Interventions DME instruction;Gait training;Stair training;Therapeutic exercise;Balance training    PT Goals (Current goals can be found in the Care Plan section)  Acute Rehab PT Goals Patient Stated Goal: to go home safely PT Goal Formulation: With patient Time For Goal Achievement: 04/22/22 Potential to Achieve Goals: Good    Frequency Min 3X/week     Co-evaluation               AM-PAC PT "6 Clicks" Mobility  Outcome Measure Help needed turning from your back to your side while in a flat bed without using bedrails?: None Help needed moving from lying on your back to sitting on the side of a flat bed without using bedrails?: None Help needed moving to and from a bed to a chair (including a wheelchair)?: A Little Help needed standing up from a  chair using your arms (e.g., wheelchair or bedside chair)?: A Little Help needed to walk in hospital room?: A Little Help needed climbing 3-5 steps with a railing? : A Lot 6 Click Score: 19    End of Session Equipment Utilized During Treatment: Gait belt Activity Tolerance: Patient tolerated treatment well Patient left: in chair;with nursing/sitter in room;with call bell/phone within reach;with chair alarm set Nurse  Communication: Mobility status PT Visit Diagnosis: Unsteadiness on feet (R26.81);Muscle weakness (generalized) (M62.81);History of falling (Z91.81);Difficulty in walking, not elsewhere classified (R26.2);Pain;Repeated falls (R29.6) Pain - Right/Left: Right Pain - part of body: Leg    Time: 1530-1555 PT Time Calculation (min) (ACUTE ONLY): 25 min   Charges:   PT Evaluation $PT Eval Low Complexity: 1 Low           Bradly Chris PT, DPT  04/08/2022, 4:30 PM

## 2022-04-08 NOTE — Progress Notes (Signed)
PROGRESS NOTE    Sandra [name]  TGG:[id number] DOB: 21-Jun-1945 DOA: 04/07/2022 PCP: Waylan Rocher, MD  [id number]/[id number]  LOS: 0 days   Brief hospital course:   Assessment & Plan: Sandra [name] is a 76 y.o. female with medical history significant of HFpEF with a EF of 60-65%, paroxysmal atrial fibrillation s/p watchman's, hemorrhagic CVA, nonobstructive CAD, celiac disease, hypothyroidism, Raynaud's, prediabetes, who presented to the ED with complaints of fever.  Of note, Sandra [name] was recently admitted from 11/4 -11/6 due to Recurrent falls complicated by nondisplaced fracture of cuboid bone of right foot and right comminuted distal radius and ulnar fracture in addition to dizziness.  She was discharged home with a cam boot and arm splint, in addition to a Zio patch to evaluate for atrial fibrillation.   * Acute pyelonephritis Patient presenting with several day history of dysuria, frequency and urgency with urinalysis consistent with urinary tract infection.  She has been on Macrobid for 2 days without significant improvement in her symptoms.   --on admission, pt had bilateral CVA tenderness that is worse on the left than the right and was dx with pyelo.  Started on ceftriaxone. Plan: --cont ceftriaxone pending urine cx    Iron deficiency anemia Patient presenting with gradually decreasing hemoglobin today at 9.1.  Iron panel with evidence of iron deficiency.  No significant def in Vit B 12 and folate.  No evidence of bleeding.   Plan: --start IV iron --discharge on oral iron supplement  Dizziness Recurrent falls Patient reports a history of dizziness when standing and recurrent falls.  During most recent admission, telemetry did not show any episodes of atrial fibrillation and patient was discharged with a Zio patch.   --PT/OT  PAF (paroxysmal atrial fibrillation) ([place]) - Telemetry monitoring while admitted - Continue low-dose metoprolol  Right arm fracture Patient  has scheduled open reduction scheduled for 11/14 with Dr. [name].   --daughter asked for right arm splint to be replaced due to it causing discomfort and bruising.  Ortho Dr. Roland Rack will assess.  Hypothyroidism - Continue home Synthroid  Acute CHF, ruled out --BNP elevated, but no hypoxia or strong evidence of volume overload. --hold further diuretics.   DVT prophylaxis: Lovenox SQ Code Status: Full code  Family Communication: daughter updated at bedside today Level of care: Telemetry Medical Dispo:   The patient is from: home Anticipated d/c is to: home Anticipated d/c date is: tomorrow   Subjective and Interval History:  Pt complained of mild sore throat.  Flank pain resolved.  Daughter reported pt having discomfort and bruising from the right arm cast, and asked for it to be re-done.   Objective: Vitals:   04/07/22 2007 04/08/22 0545 04/08/22 0800 04/08/22 1548  BP:  130/66 129/66 128/70  Pulse: 71 70 69 73  Resp:  16 16 18   Temp:  98.8 F (37.1 C) 97.9 F (36.6 C) 98 F (36.7 C)  TempSrc:  Oral Oral Oral  SpO2: 93% 94% 92% 98%  Weight:  60 kg    Height:        Intake/Output Summary (Last 24 hours) at 04/08/2022 1617 Last data filed at 04/08/2022 1200 Gross per 24 hour  Intake 680 ml  Output 2001 ml  Net -1321 ml   Filed Weights   04/07/22 0957 04/08/22 0545  Weight: 60.3 kg 60 kg    Examination:   Constitutional: NAD, AAOx3, sitting in recliner HEENT: conjunctivae and lids normal, EOMI CV: No cyanosis.   RESP:  normal respiratory effort, on RA Neuro: II - XII grossly intact.   Psych: Normal mood and affect.  Appropriate judgement and reason   Data Reviewed: I have personally reviewed labs and imaging studies  Time spent: 50 minutes  Enzo Bi, MD [facility] If 7PM-7AM, please contact night-coverage 04/08/2022, 4:17 PM

## 2022-04-08 NOTE — Evaluation (Signed)
Occupational Therapy Evaluation Patient Details Name: Sandra [name] MRN: [id number] DOB: 13-Apr-1946 Today's Date: 04/08/2022   History of Present Illness Sandra [name] is a 76 y.o. female with medical history significant of HFpEF with a EF of 60-65%, paroxysmal atrial fibrillation s/p watchman's, hemorrhagic CVA, nonobstructive CAD, celiac disease, hypothyroidism, Raynaud's, prediabetes, who presents to the ED with complaints of fever. She was recently admitted from 11/4 -11/6 due to recurrent falls complicated by nondisplaced fracture of cuboid bone of right foot and right comminuted distal radius and ulnar fracture.   Clinical Impression   Patient seen for OT evaluation, daughter present. Pt presenting with decreased independence in self care, balance, functional mobility/transfers, endurance, and safety awareness. At baseline, pt is independent with ADLs/IADLs. Pt is primary caregiver for husband. Pt currently functioning at [place] guard for bed mobility, Min A for functional transfers, Min guard to take steps toward recliner using quad cane, and Max A for LB dressing. Pt required frequent VC for sequencing and safety when ambulating with quad cane. Pt will benefit from acute OT to increase overall independence in the areas of ADLs and functional mobility in order to safely discharge to next venue of care. Upon hospital discharge, recommend STR to maximize pt safety and return to PLOF.       Recommendations for follow up therapy are one component of a multi-disciplinary discharge planning process, led by the attending physician.  Recommendations may be updated based on patient status, additional functional criteria and insurance authorization.   Follow Up Recommendations  Skilled nursing-short term rehab (<3 hours/day)    Assistance Recommended at Discharge Frequent or constant Supervision/Assistance  Patient can return home with the following A little help with walking and/or  transfers;Assistance with cooking/housework;Assist for transportation;Help with stairs or ramp for entrance;A lot of help with bathing/dressing/bathroom    Functional Status Assessment  Patient has had a recent decline in their functional status and demonstrates the ability to make significant improvements in function in a reasonable and predictable amount of time.  Equipment Recommendations  Other (comment) (defer to next venue of care)    Recommendations for Other Services       Precautions / Restrictions Precautions Precautions: Fall Precaution Comments: Camboot for RLE, RUE in splint Restrictions Weight Bearing Restrictions: Yes RUE Weight Bearing: Non weight bearing RLE Weight Bearing: Weight bearing as tolerated      Mobility Bed Mobility Overal bed mobility: Needs Assistance Bed Mobility: Supine to Sit     Supine to sit: Min guard          Transfers Overall transfer level: Needs assistance Equipment used: Quad cane Transfers: Sit to/from Stand Sit to Stand: Min assist           General transfer comment: VC for hand placement, lifting assistance required, once standing able to tolerate weight through RLE      Balance Overall balance assessment: Needs assistance, History of Falls Sitting-balance support: Feet supported, Single extremity supported Sitting balance-Leahy Scale: Fair Sitting balance - Comments: pt required support from bed rail using LUE, Min guard for static sitting balance   Standing balance support: Single extremity supported Standing balance-Leahy Scale: Fair                             ADL either performed or assessed with clinical judgement   ADL Overall ADL's : Needs assistance/impaired Eating/Feeding: Set up;Sitting     Grooming Details (indicate cue type and reason):  anticipate assistance required for bimanual activities such as placing toothpaste on toothbrush during oral care         Upper Body Dressing :  Moderate assistance;Sitting Upper Body Dressing Details (indicate cue type and reason): anticipate Lower Body Dressing: Maximal assistance;Sitting/lateral leans Lower Body Dressing Details (indicate cue type and reason): to don socks and camboot on RLE Toilet Transfer: Minimal assistance Toilet Transfer Details (indicate cue type and reason): simulated with STS from EOB using quad cane         Functional mobility during ADLs: Cane;Cueing for sequencing;Cueing for safety;Min guard (to take steps toward recliner)       Vision Patient Visual Report: No change from baseline       Perception     Praxis      Pertinent Vitals/Pain Pain Assessment Pain Assessment: No/denies pain     Hand Dominance Right   Extremity/Trunk Assessment Upper Extremity Assessment Upper Extremity Assessment: Generalized weakness;RUE deficits/detail RUE Deficits / Details: in splint RUE: Unable to fully assess due to immobilization   Lower Extremity Assessment Lower Extremity Assessment: Generalized weakness   Cervical / Trunk Assessment Cervical / Trunk Assessment: Normal   Communication Communication Communication: HOH (has hearing aids and amplifier)   Cognition Arousal/Alertness: Awake/alert Behavior During Therapy: WFL for tasks assessed/performed Overall Cognitive Status: Within Functional Limits for tasks assessed                                 General Comments: Reports being a little "out of it" due to pain medications     General Comments  Pt reported mild dizziness sitting EOB, however, VSS and improved with time.    Exercises Other Exercises Other Exercises: OT provided education re: role of OT, OT POC, post acute recs, sitting up for all meals, EOB/OOB mobility with assistance, home/fall safety.     Shoulder Instructions      Home Living Family/patient expects to be discharged to:: Private residence Living Arrangements: Spouse/significant other Available  Help at Discharge: Family;Available PRN/intermittently;Friend(s) Type of Home: House Home Access: Stairs to enter [employer] of Steps: 2 Entrance Stairs-Rails: Right Home Layout: Two level;Able to live on main level with bedroom/bathroom     Bathroom Shower/Tub: Occupational psychologist: Handicapped height     Home Equipment: [place] (4 wheels);Cane - quad;BSC/3in1   Additional Comments: Daughter is visiting but lives in [place]; assist from granddaughter who lives nearby PRN      Prior Functioning/Environment Prior Level of Function : Independent/Modified Independent             Mobility Comments: furniture cruises at home. Reports multiple recent falls. ADLs Comments: Independent with ADLs. Caregiver for husband, completes all IADLs        OT Problem List: Decreased strength;Impaired balance (sitting and/or standing);Decreased safety awareness;Pain;Decreased activity tolerance;Decreased knowledge of use of DME or AE;Impaired UE functional use      OT Treatment/Interventions: Self-care/ADL training;Therapeutic activities;Balance training;Therapeutic exercise;Patient/family education;DME and/or AE instruction    OT Goals(Current goals can be found in the care plan section) Acute Rehab OT Goals Patient Stated Goal: return home OT Goal Formulation: With patient/family Time For Goal Achievement: 04/22/22 Potential to Achieve Goals: Good ADL Goals Pt Will Perform Upper Body Dressing: with supervision;sitting Pt Will Perform Lower Body Dressing: with supervision;with adaptive equipment;sit to/from stand Pt Will Transfer to Toilet: with supervision;ambulating;bedside commode Pt Will Perform Toileting - Clothing Manipulation and hygiene: with  supervision;sit to/from stand;with adaptive equipment  OT Frequency: Min 2X/week    Co-evaluation              AM-PAC OT "6 Clicks" Daily Activity     Outcome Measure Help from another person eating meals?:  None Help from another person taking care of personal grooming?: A Little Help from another person toileting, which includes using toliet, bedpan, or urinal?: A Lot Help from another person bathing (including washing, rinsing, drying)?: A Lot Help from another person to put on and taking off regular upper body clothing?: A Lot Help from another person to put on and taking off regular lower body clothing?: A Lot 6 Click Score: 15   End of Session Equipment Utilized During Treatment: Gait belt;Other (comment) (quad cane) Nurse Communication: Mobility status  Activity Tolerance: Patient tolerated treatment well Patient left: in chair;with call bell/phone within reach;with family/visitor present  OT Visit Diagnosis: History of falling (Z91.81);Muscle weakness (generalized) (M62.81)                Time: 7939-6886 OT Time Calculation (min): 24 min Charges:  OT General Charges $OT Visit: 1 Visit OT Evaluation $OT Eval Moderate Complexity: 1 Mod  [facility] MS, OTR/L ascom [phone]  04/08/22, 1:20 PM

## 2022-04-08 NOTE — Consult Note (Signed)
ORTHOPAEDIC CONSULTATION  REQUESTING PHYSICIAN: Enzo Bi, MD  Chief Complaint:   Right wrist injury  History of Present Illness: Sandra [name] is a 76 y.o. female with multiple medical problems including paroxysmal atrial fibrillation, coronary artery disease, congestive heart failure, hypertension, hypothyroidism, celiac disease, gastroesophageal reflux disease, and anxiety/depression who apparently sustained an injury to her right wrist 1 week ago when she fell at home and landed on her right wrist.  Subsequent imaging demonstrated a displaced distal radius and ulna fracture.  She followed up with Dr. [name] who has scheduled her for surgery to stabilize her wrist fracture on 04/11/2022.  The patient was admitted yesterday for pyelonephritis and presently is receiving IV antibiotics.  I have been asked to evaluate the patient because her family has noticed some ecchymosis in the inner aspect of her upper arm and is concerned that the splint is causing a problem.  The patient notes only mild discomfort in the wrist at this time and feels that her symptoms have improved over the past several days.  She denies any reinjury to the wrist, and denies any numbness or paresthesias to the fingers.  Past Medical History:  Diagnosis Date   (HFpEF) heart failure with preserved ejection fraction ([place])    a. 08/2019 Echo: EF 60-65%, no rwma, mild LVH, Gr2 DD, nl RV fxn. Nl pASP. Mildly dil LA. Mild to mod MR.   Anemia    Anxiety    Arthritis    BRCA gene mutation negative 10/2016   NEGATIVE: [employer]   Breast cancer ([place]) 10/16/2016   T1c,N0;, GRADE I/III, 1.6cm. ER/PR pos  HER2 not over expressed, Right Upper Outer   Celiac disease    Celiac syndrome    Cerebellar hemorrhage, acute ([place]) 07/17/2020   Depression    Dyspnea    WITH EXERTION   Fatty liver    GERD (gastroesophageal reflux disease)    OCC   Glaucoma    right eye   Headache     H/O MIGRAINES AS TEENAGER.   Heart murmur    a. 08/2019 Echo: mild to mod MR.   Hypertension    Hypothyroidism    Non-obstructive CAD (coronary artery disease)    a. 01/2017 MV: Hypertensive response. No ischemia/infarct. EF >65%; b. 09/2019 Cor CTA: Ca2+ = 9.29 (36th percentile). LAD calcified plaque (0-24%), otw nl. Multipel bilat pulm nodules up to 77m.   Osteoporosis    PAF (paroxysmal atrial fibrillation) ([place]    a. 02/2020 [facility]io: predominantly RSR, 65 (50-105), rare PACs/PVCs, 8 beats NSVT, multiple episodes of PAF lasting up to 1hr 263ms. Avg AF rate 130 (93-170). AF burden <1%. Triggered events = RSR, PACs, and PAF; b. CHA2DS2VASc = 6.   Pneumonia    YEARS AGO   Pre-diabetes    Presence of Watchman left atrial appendage closure device 01/2021   Pulmonary nodules    a. 09/2019 Cor CTA: incidental finding of multiple bilat pulm nodules; b. 12/2019 High Res CT: stable, scattered solid pulm nodules.   Raynaud's disease    RLS (restless legs syndrome)    [place]   mild balance issues   Wears hearing aid in both ears    Past Surgical History:  Procedure Laterality Date   ABDOMINAL HYSTERECTOMY     APPENDECTOMY     AXILLARY LYMPH NODE BIOPSY Right 03/05/2017   Procedure: AXILLARY LYMPH NODE BIOPSY;  Surgeon: ByRobert BellowMD;  Location: [facility] ORS;  Service: General;  Laterality: Right;   CATARACT  EXTRACTION Bilateral    CESAREAN SECTION     COLONOSCOPY WITH PROPOFOL N/A 05/19/2015   Procedure: COLONOSCOPY WITH PROPOFOL;  Surgeon: Manya Silvas, MD;  Location: [facility] ENDOSCOPY;  Service: Endoscopy;  Laterality: N/A;   COLONOSCOPY WITH PROPOFOL N/A 08/01/2019   Procedure: COLONOSCOPY WITH PROPOFOL;  Surgeon: Lucilla Lame, MD;  Location: [facility] ENDOSCOPY;  Service: Endoscopy;  Laterality: N/A;   EYE SURGERY     HAND SURGERY Left    LEFT ATRIAL APPENDAGE OCCLUSION N/A 01/27/2021   Procedure: LEFT ATRIAL APPENDAGE OCCLUSION;  Surgeon: Vickie Epley, MD;  Location: [place] CV LAB;  Service: Cardiovascular;  Laterality: N/A;   MASTECTOMY Right 11/07/2016   RESIDUAL INVASIVE MAMMARY CARCINOMA, SUBAREOLAR ANTERIOR TO PREVIOUS    SENTINEL NODE BIOPSY Right 11/07/2016   Procedure: SENTINEL NODE BIOPSY;  Surgeon: Robert Bellow, MD;  Location: [facility] ORS;  Service: General;  Laterality: Right;   SIMPLE MASTECTOMY WITH AXILLARY SENTINEL NODE BIOPSY Right 11/07/2016   6 mm ER/PR 100%; Her 2 neu not overexpressed, T1b, N0.  Surgeon: Robert Bellow, MD;  Location: [facility] ORS;  Service: General;  Laterality: Right;   TEE WITHOUT CARDIOVERSION N/A 01/27/2021   Procedure: TRANSESOPHAGEAL ECHOCARDIOGRAM (TEE);  Surgeon: Vickie Epley, MD;  Location: [place] [facility];  Service: Cardiovascular;  Laterality: N/A;   TEE WITHOUT CARDIOVERSION N/A 03/10/2021   Procedure: TRANSESOPHAGEAL ECHOCARDIOGRAM (TEE);  Surgeon: Elouise Munroe, MD;  Location: [place];  Service: Cardiology;  Laterality: N/A;   TONSILLECTOMY  AGE 38   UPPER GI ENDOSCOPY  01/12/06   hiatus hernia   Social History   Socioeconomic History   Marital status: Married    Spouse name: Nadara Mustard   Number of children: 2   Years of education: Not on file   Highest education level: Some college, no degree  Occupational History   Occupation: retired  Tobacco Use   Smoking status: Never   Smokeless tobacco: Never  Vaping Use   Vaping Use: Never used  Substance and Sexual Activity   Alcohol use: Not Currently   Drug use: No   Sexual activity: Not Currently    Birth control/protection: None  Other Topics Concern   Not on file  Social History Narrative   Lives with Husband   Right handed   Drinks 2-3 cups caffiene daily   Social Determinants of Health   Financial Resource Strain: Low Risk  (05/18/2021)   Overall Financial Resource Strain (CARDIA)    Difficulty of Paying Living Expenses: Not hard at all  Food Insecurity: No Food Insecurity (04/07/2022)   Hunger Vital Sign    Worried  About Running Out of Food in the Last Year: Never true    [place] in the Last Year: Never true  Transportation Needs: No Transportation Needs (04/07/2022)   PRAPARE - Hydrologist (Medical): No    Lack of Transportation (Non-Medical): No  Physical Activity: Insufficiently Active (05/18/2021)   Exercise Vital Sign    Days of Exercise per Week: 3 days    Minutes of Exercise per Session: 30 min  Stress: No Stress Concern Present (05/18/2021)   [place]    Feeling of Stress : Only a little  Social Connections: Socially Integrated (05/18/2021)   Social Connection and Isolation Panel [NHANES]    Frequency of Communication with Friends and Family: More than three times a week    Frequency of Social  Gatherings with Friends and Family: More than three times a week    Attends Religious Services: More than 4 times per year    Active Member of [employer] or Organizations: Yes    Attends Music therapist: More than 4 times per year    Marital Status: Married   Family History  Problem Relation Age of Onset   Cancer Mother    Anemia Mother    Other Mother        lymphosarcoma   Alcohol abuse Father    Prostate cancer Father    Breast cancer Sister        17; 1/2 sister, shared mother.   Cancer Sister        bile duct   Stroke Maternal Grandmother    Breast cancer Maternal Grandmother 68   Heart attack Maternal Grandfather    CAD Paternal Grandfather    Allergies  Allergen Reactions   Tape Rash   Prior to Admission medications   Medication Sig Start Date End Date Taking? Authorizing Provider  acetaminophen (TYLENOL) 325 MG tablet Take 2 tablets (650 mg total) by mouth every 6 (six) hours as needed for mild pain (or Fever >/= 101). 04/03/22  Yes Rai, Ripudeep K, MD  ALPRAZolam (XANAX) 0.5 MG tablet Take 1 tablet (0.5 mg total) by mouth at bedtime as needed. for sleep 03/24/22   Yes Birdie Sons, MD  amLODipine (NORVASC) 10 MG tablet TAKE 1 TABLET BY MOUTH  DAILY Patient taking differently: Take 5 mg by mouth daily. 07/12/21  Yes Jerrol Banana., MD  aspirin EC 81 MG tablet Take 1 tablet (81 mg total) by mouth daily. Swallow whole. 07/27/21  Yes Vickie Epley, MD  atorvastatin (LIPITOR) 40 MG tablet TAKE 1 TABLET BY MOUTH DAILY Patient taking differently: Take 40 mg by mouth daily. 09/02/21  Yes Dunn, Areta Haber, PA-C  Calcium Carb-Cholecalciferol (CALCIUM 600 + D PO) Take 1 tablet by mouth daily.   Yes [provider]  Cholecalciferol (VITAMIN D3) 125 MCG (5000 UT) TABS Take 5,000 Units by mouth daily.   Yes [provider]  fluconazole (DIFLUCAN) 150 MG tablet Take 150 mg by mouth once. 04/05/22  Yes [provider]  latanoprost (XALATAN) 0.005 % ophthalmic solution Place 1 drop into both eyes at bedtime.   Yes [provider]  levothyroxine (SYNTHROID) 100 MCG tablet TAKE 1 TABLET BY MOUTH 30  MINUTES BEFORE BREAKFAST Patient taking differently: Take 100 mcg by mouth daily before breakfast. TAKE 1 TABLET BY MOUTH 30  MINUTES BEFORE BREAKFAST 02/02/22  Yes Jerrol Banana., MD  metoprolol succinate (TOPROL-XL) 25 MG 24 hr tablet Take 1 tablet (25 mg total) by mouth daily. Patient taking differently: Take 12.5 mg by mouth daily. May take additional 12.5 mg for A Fib 04/03/22  Yes Rai, Ripudeep K, MD  Multiple Vitamin (MULTIVITAMIN WITH MINERALS) TABS tablet Take 1 tablet by mouth daily at 3 pm.   Yes [provider]  nitrofurantoin (MACRODANTIN) 100 MG capsule Take 100 mg by mouth every 12 (twelve) hours. 04/05/22  Yes [provider]  omeprazole (PRILOSEC) 40 MG capsule TAKE 1 CAPSULE BY MOUTH DAILY Patient taking differently: Take 40 mg by mouth daily. 02/02/22  Yes Jerrol Banana., MD  polyethylene glycol (MIRALAX / GLYCOLAX) 17 g packet Take 17 g by mouth daily as needed for moderate constipation.    Yes [provider]  sertraline (ZOLOFT) 100 MG tablet TAKE 1 TABLET  BY MOUTH DAILY Patient taking differently: Take 100 mg by mouth daily. 12/05/21  Yes Jerrol Banana., MD  timolol (BETIMOL) 0.5 % ophthalmic solution Place 1 drop into both eyes 2 (two) times daily.   Yes [provider]  traMADol (ULTRAM) 50 MG tablet Take 1-2 tablets (50-100 mg total) by mouth every 8 (eight) hours as needed for up to 5 days for moderate pain or severe pain. 04/03/22 04/08/22 Yes Rai, Ripudeep K, MD  Urelle (URELLE/URISED) 81 MG TABS tablet Take 1 tablet (81 mg total) by mouth every 8 (eight) hours as needed for bladder spasms. 04/03/22  Yes Rai, Ripudeep K, MD  bisacodyl 5 MG EC tablet Take 5 mg by mouth daily as needed for moderate constipation.    [provider]  docusate sodium (COLACE) 100 MG capsule Take 100 mg by mouth daily as needed for mild constipation.    [provider]  HYDROcodone-acetaminophen (NORCO/VICODIN) 5-325 MG tablet Take by mouth. 04/06/22   [provider]  hydroxypropyl methylcellulose / hypromellose (ISOPTO TEARS / GONIOVISC) 2.5 % ophthalmic solution Place 1 drop into both eyes 4 (four) times daily as needed for dry eyes.    [provider]  potassium chloride SA (KLOR-CON M) 20 MEQ tablet Take 1 tablet (20 mEq total) by mouth daily. Patient not taking: Reported on 04/04/2022 03/24/22   Nelva Bush, MD   DG Chest Port 1 View  Result Date: 04/07/2022 CLINICAL DATA:  Fever and possible UTI. History of breast cancer. EXAM: PORTABLE CHEST 1 VIEW COMPARISON:  December 30, 2020 FINDINGS: Electronic apparatus overlie the superior heart border. Cardiomediastinal silhouette is normal. Mediastinal contours appear intact. Calcific atherosclerotic disease and tortuosity of the aorta. There is no evidence of focal airspace consolidation, pleural effusion or pneumothorax. Mild coarsening of interstitial markings. Osseous structures are  without acute abnormality. Soft tissues are grossly normal. IMPRESSION: 1. Mild coarsening of the interstitial markings, which may be seen with pulmonary vascular congestion. 2. No definite focal airspace consolidation. Electronically Signed   By: Fidela Salisbury M.D.   On: 04/07/2022 13:04   CT Renal Stone Study  Result Date: 04/07/2022 CLINICAL DATA:  Flank pain. EXAM: CT ABDOMEN AND PELVIS WITHOUT CONTRAST TECHNIQUE: Multidetector CT imaging of the abdomen and pelvis was performed following the standard protocol without IV contrast. RADIATION DOSE REDUCTION: This exam was performed according to the departmental dose-optimization program which includes automated exposure control, adjustment of the mA and/or kV according to patient size and/or use of iterative reconstruction technique. COMPARISON:  November 26, 2019 FINDINGS: Lower chest: No acute abnormality. Hepatobiliary: No focal liver abnormality is seen. No gallstones, gallbladder wall thickening, or biliary dilatation. Pancreas: Unremarkable. No pancreatic ductal dilatation or surrounding inflammatory changes. Spleen: Normal in size without focal abnormality. Adrenals/Urinary Tract: Normal adrenal glands. Bilateral nonobstructive renal calculi. 2 mm nonobstructive calculus in the lower pole the right kidney. Four nonobstructive calculi in the left kidney with the largest measuring 4 mm in the lower pole. Stomach/Bowel: Stomach is within normal limits. Appendix appears normal. No evidence of bowel wall thickening, distention, or inflammatory changes. Vascular/Lymphatic: Aortic atherosclerosis. No enlarged abdominal or pelvic lymph nodes. Reproductive: Status post hysterectomy. No adnexal masses. Other: Focal mesenteric stranding with a few sub pathologic by CT criteria lymph nodes in the left central abdomen. The findings are best appreciated on the coronal view, image [id number] 7/92. Musculoskeletal: Spondylosis of the lower lumbosacral spine. IMPRESSION:  1. Bilateral nonobstructive renal calculi. 2. Focal mesenteric stranding with a  few interspersed sub pathologic by CT criteria lymph nodes in the left central abdomen. This finding is nonspecific, but may represent mesenteric lymphadenitis or panniculitis. 3. Aortic atherosclerosis. Aortic Atherosclerosis (ICD10-I70.0). Electronically Signed   By: Fidela Salisbury M.D.   On: 04/07/2022 11:25    Positive ROS: All other systems have been reviewed and were otherwise negative with the exception of those mentioned in the HPI and as above.  Physical Exam: General:  Alert, no acute distress Psychiatric:  Patient is competent for consent with normal mood and affect   Cardiovascular:  No pedal edema Respiratory:  No wheezing, non-labored breathing GI:  Abdomen is soft and non-tender Skin:  No lesions in the area of chief complaint Neurologic:  Sensation intact distally Lymphatic:  No axillary or cervical lymphadenopathy  Orthopedic Exam:  Orthopedic examination is limited to the right upper extremity and hand.  The patient is in a sugar-tong splint to maintain the wrist in neutral position.  The splint appears to be in good condition and without evidence of loosening.  Skin inspection is notable for an area of ecchymosis on the inner aspect of her upper arm just proximal to the elbow.  This is most consistent with blood from her fracture having tracked up the inner aspect of her forearm to her upper arm, most likely from keeping the arm elevated as instructed.  The patient is able to wiggle her fingers and has good capillary refill to the tips of all digits.  She also has good sensation to the tips of all digits.  Assessment: Status post displaced right distal radius fracture.  Plan: The patient has been reassured that she did not sustain any new injuries and that the splint is not the cause of this area of ecchymosis.  If she continues to respond well to her course of antibiotics, she might still be  able to undergo her surgery Tuesday with Dr. [name].  I will let him know that she was admitted yesterday and he will decide whether to proceed with surgery or to put it off a few more days.  Thank you for asking me to participate in the care of this most pleasant woman.  I will be available if you have any further need of orthopedic input.   Pascal Lux, MD  Beeper #:  [phone]  04/08/2022 3:47 PM

## 2022-04-09 DIAGNOSIS — Z1152 Encounter for screening for COVID-19: Secondary | ICD-10-CM | POA: Diagnosis not present

## 2022-04-09 DIAGNOSIS — N10 Acute tubulo-interstitial nephritis: Secondary | ICD-10-CM | POA: Diagnosis present

## 2022-04-09 DIAGNOSIS — E876 Hypokalemia: Secondary | ICD-10-CM | POA: Diagnosis present

## 2022-04-09 DIAGNOSIS — Z803 Family history of malignant neoplasm of breast: Secondary | ICD-10-CM | POA: Diagnosis not present

## 2022-04-09 DIAGNOSIS — I7300 Raynaud's syndrome without gangrene: Secondary | ICD-10-CM | POA: Diagnosis present

## 2022-04-09 DIAGNOSIS — Z79890 Hormone replacement therapy (postmenopausal): Secondary | ICD-10-CM | POA: Diagnosis not present

## 2022-04-09 DIAGNOSIS — I5032 Chronic diastolic (congestive) heart failure: Secondary | ICD-10-CM | POA: Diagnosis present

## 2022-04-09 DIAGNOSIS — W19XXXA Unspecified fall, initial encounter: Secondary | ICD-10-CM | POA: Diagnosis present

## 2022-04-09 DIAGNOSIS — N3000 Acute cystitis without hematuria: Secondary | ICD-10-CM | POA: Diagnosis present

## 2022-04-09 DIAGNOSIS — S92214A Nondisplaced fracture of cuboid bone of right foot, initial encounter for closed fracture: Secondary | ICD-10-CM | POA: Diagnosis present

## 2022-04-09 DIAGNOSIS — Z95818 Presence of other cardiac implants and grafts: Secondary | ICD-10-CM | POA: Diagnosis not present

## 2022-04-09 DIAGNOSIS — I110 Hypertensive heart disease with heart failure: Secondary | ICD-10-CM | POA: Diagnosis present

## 2022-04-09 DIAGNOSIS — I480 Paroxysmal atrial fibrillation: Secondary | ICD-10-CM | POA: Diagnosis present

## 2022-04-09 DIAGNOSIS — E039 Hypothyroidism, unspecified: Secondary | ICD-10-CM | POA: Diagnosis present

## 2022-04-09 DIAGNOSIS — R58 Hemorrhage, not elsewhere classified: Secondary | ICD-10-CM | POA: Diagnosis present

## 2022-04-09 DIAGNOSIS — D509 Iron deficiency anemia, unspecified: Secondary | ICD-10-CM | POA: Diagnosis present

## 2022-04-09 DIAGNOSIS — R7303 Prediabetes: Secondary | ICD-10-CM | POA: Diagnosis present

## 2022-04-09 DIAGNOSIS — N12 Tubulo-interstitial nephritis, not specified as acute or chronic: Secondary | ICD-10-CM | POA: Diagnosis present

## 2022-04-09 DIAGNOSIS — R296 Repeated falls: Secondary | ICD-10-CM | POA: Diagnosis present

## 2022-04-09 DIAGNOSIS — R42 Dizziness and giddiness: Secondary | ICD-10-CM | POA: Diagnosis present

## 2022-04-09 DIAGNOSIS — G2581 Restless legs syndrome: Secondary | ICD-10-CM | POA: Diagnosis present

## 2022-04-09 DIAGNOSIS — I2510 Atherosclerotic heart disease of native coronary artery without angina pectoris: Secondary | ICD-10-CM | POA: Diagnosis present

## 2022-04-09 DIAGNOSIS — K219 Gastro-esophageal reflux disease without esophagitis: Secondary | ICD-10-CM | POA: Diagnosis present

## 2022-04-09 DIAGNOSIS — F32A Depression, unspecified: Secondary | ICD-10-CM | POA: Diagnosis present

## 2022-04-09 DIAGNOSIS — Z8673 Personal history of transient ischemic attack (TIA), and cerebral infarction without residual deficits: Secondary | ICD-10-CM | POA: Diagnosis not present

## 2022-04-09 DIAGNOSIS — K900 Celiac disease: Secondary | ICD-10-CM | POA: Diagnosis present

## 2022-04-09 DIAGNOSIS — K760 Fatty (change of) liver, not elsewhere classified: Secondary | ICD-10-CM | POA: Diagnosis present

## 2022-04-09 LAB — CBC
HCT: 30.1 % — ABNORMAL LOW (ref 36.0–46.0)
Hemoglobin: 9.6 g/dL — ABNORMAL LOW (ref 12.0–15.0)
MCH: 27.1 pg (ref 26.0–34.0)
MCHC: 31.9 g/dL (ref 30.0–36.0)
MCV: 85 fL (ref 80.0–100.0)
Platelets: 276 10*3/uL (ref 150–400)
RBC: 3.54 MIL/uL — ABNORMAL LOW (ref 3.87–5.11)
RDW: 14.5 % (ref 11.5–15.5)
WBC: 5.2 10*3/uL (ref 4.0–10.5)
nRBC: 0 % (ref 0.0–0.2)

## 2022-04-09 LAB — HEPATIC FUNCTION PANEL
ALT: 42 U/L (ref 0–44)
AST: 48 U/L — ABNORMAL HIGH (ref 15–41)
Albumin: 3 g/dL — ABNORMAL LOW (ref 3.5–5.0)
Alkaline Phosphatase: 167 U/L — ABNORMAL HIGH (ref 38–126)
Bilirubin, Direct: 0.1 mg/dL (ref 0.0–0.2)
Indirect Bilirubin: 0.7 mg/dL (ref 0.3–0.9)
Total Bilirubin: 0.8 mg/dL (ref 0.3–1.2)
Total Protein: 6.7 g/dL (ref 6.5–8.1)

## 2022-04-09 LAB — BASIC METABOLIC PANEL
Anion gap: 6 (ref 5–15)
BUN: 12 mg/dL (ref 8–23)
CO2: 24 mmol/L (ref 22–32)
Calcium: 8.5 mg/dL — ABNORMAL LOW (ref 8.9–10.3)
Chloride: 111 mmol/L (ref 98–111)
Creatinine, Ser: 0.55 mg/dL (ref 0.44–1.00)
GFR, Estimated: >60 mL/min (ref >60)
Glucose, Bld: 97 mg/dL (ref 70–99)
Potassium: 3.8 mmol/L (ref 3.5–5.1)
Sodium: 141 mmol/L (ref 135–145)

## 2022-04-09 LAB — MAGNESIUM: Magnesium: 2.3 mg/dL (ref 1.7–2.4)

## 2022-04-09 MED ORDER — POLYSACCHARIDE IRON COMPLEX 150 MG PO CAPS: 150.0000 mg | ORAL_CAPSULE | Freq: Every day | ORAL | Status: DC

## 2022-04-09 MED ORDER — CIPROFLOXACIN HCL 500 MG PO TABS: 500.0000 mg | ORAL_TABLET | Freq: Two times a day (BID) | ORAL | 0 refills | Status: AC

## 2022-04-09 MED ORDER — SODIUM CHLORIDE 0.9 % IV SOLN
200.0000 mg | Freq: Every day | INTRAVENOUS | Status: DC
  Administered 2022-04-09: 200 mg via INTRAVENOUS
  Filled 2022-04-09: qty 10

## 2022-04-09 MED ORDER — AMLODIPINE BESYLATE 10 MG PO TABS: 5.0000 mg | ORAL_TABLET | Freq: Every day | ORAL | Status: DC

## 2022-04-09 MED ORDER — METOPROLOL SUCCINATE ER 25 MG PO TB24: 12.5000 mg | ORAL_TABLET | Freq: Every day | ORAL | Status: DC

## 2022-04-09 MED ORDER — SODIUM CHLORIDE 0.9 % IV SOLN
2.0000 g | INTRAVENOUS | Status: DC
  Administered 2022-04-09: 2 g via INTRAVENOUS
  Filled 2022-04-09: qty 2

## 2022-04-09 MED ORDER — POTASSIUM CHLORIDE CRYS ER 20 MEQ PO TBCR: 20.0000 meq | EXTENDED_RELEASE_TABLET | Freq: Every day | ORAL | 0 refills | Status: DC

## 2022-04-09 NOTE — Discharge Summary (Signed)
Physician Discharge Summary   Sandra [name]  female DOB: [id number]  WOE:[id number]  PCP: Waylan Rocher, MD  Admit date: 04/07/2022 Discharge date: 04/09/2022  Admitted From: home Disposition:  home.  Pt declined SNF rehab Home Health: Yes CODE STATUS: Full code  Discharge Instructions     Discharge instructions   Complete by: As directed    You have received 3 days of IV ceftriaxone for your UTI/kidney infection.  Please finish 4 more days of Cipro at home starting 04/10/22.  Please take potassium supplement 20 mEq daily for 7 more days, and then check level with your outpatient provider to see if you need to continue.   Dr. Enzo Bi [facility] Course:  For full details, please see H&P, progress notes, consult notes and ancillary notes.  Briefly,  Sandra [name] is a 76 y.o. female with medical history significant of HFpEF, paroxysmal atrial fibrillation s/p watchman's, hemorrhagic CVA, nonobstructive CAD, celiac disease, hypothyroidism, Raynaud's, prediabetes, who presented to the ED with complaints of fever.   Of note, Sandra [name] was recently admitted from 11/4 -11/6 due to recurrent falls complicated by nondisplaced fracture of cuboid bone of right foot and right comminuted distal radius and ulnar fracture in addition to dizziness.  She was discharged home with a cam boot and arm splint, in addition to a Zio patch to evaluate for atrial fibrillation.   * Acute pyelonephritis Patient presenting with several day history of dysuria, frequency and urgency with urinalysis consistent with urinary tract infection.  Urine cx was obtained on 04/02/22 which resulted in pan-sensitive E coli.  She took Macrobid for 2 days without significant improvement in her symptoms.   --on admission, pt had bilateral CVA tenderness that was worse on the left than the right and was dx with pyelo.  Started on ceftriaxone. --Pt received 3 days of ceftriaxone and discharged on 4  more days of Cipro.   Iron deficiency anemia --Hgb stable around 9's. --Iron panel with evidence of iron deficiency.  No significant def in Vit B 12 and folate.  No evidence of bleeding.   --pt received IV iron x2 doses and discharged on oral iron supplement   Dizziness Recurrent falls Patient reports a history of dizziness when standing and recurrent falls.  During most recent admission, telemetry did not show any episodes of atrial fibrillation and patient was discharged with a Zio patch.   --PT/OT rec SNF rehab, however, pt decided to go home.   PAF (paroxysmal atrial fibrillation) (HCC) --not on anticoagulation PTA --cont home Toprol   Right arm fracture Patient has scheduled open reduction scheduled for 11/14 with Dr. [name].   --daughter asked for right arm splint to be replaced due to it causing discomfort and bruising.  Ortho Dr. Roland Rack assessed and found current splint to be positioned correctly and not the cause of ecchymosis.   --f/u with Dr. [name] as outpatient   Hypothyroidism - Continue home Synthroid  Hypokalemia --pt was recently on a short course of oral potassium supplement.  On presentation, potassium 3.2, and pt needed supplementation.  Pt was discharged on oral potassium supplement 20 mEq daily for 7 more days, and then advised to check level with outpatient provider to determined further need for supplementation.   Acute CHF, ruled out --BNP elevated, but no hypoxia or strong evidence of volume overload. --hold further diuretics.   Discharge Diagnoses:  Principal Problem:   Acute pyelonephritis Active Problems:  Iron deficiency anemia   Dizziness   PAF (paroxysmal atrial fibrillation) (HCC)   Right arm fracture   Hypothyroidism     Discharge Instructions:  Allergies as of 04/09/2022       Reactions   Tape Rash        Medication List     STOP taking these medications    fluconazole 150 MG tablet Commonly known as: DIFLUCAN    nitrofurantoin 100 MG capsule Commonly known as: MACRODANTIN   traMADol 50 MG tablet Commonly known as: ULTRAM   Urelle 81 MG Tabs tablet       TAKE these medications    acetaminophen 325 MG tablet Commonly known as: TYLENOL Take 2 tablets (650 mg total) by mouth every 6 (six) hours as needed for mild pain (or Fever >/= 101).   ALPRAZolam 0.5 MG tablet Commonly known as: XANAX Take 1 tablet (0.5 mg total) by mouth at bedtime as needed. for sleep   amLODipine 10 MG tablet Commonly known as: NORVASC Take 0.5 tablets (5 mg total) by mouth daily. Home med What changed:  how much to take additional instructions   aspirin EC 81 MG tablet Take 1 tablet (81 mg total) by mouth daily. Swallow whole.   atorvastatin 40 MG tablet Commonly known as: LIPITOR TAKE 1 TABLET BY MOUTH DAILY   bisacodyl 5 MG EC tablet Generic drug: bisacodyl Take 5 mg by mouth daily as needed for moderate constipation.   CALCIUM 600 + D PO Take 1 tablet by mouth daily.   ciprofloxacin 500 MG tablet Commonly known as: Cipro Take 1 tablet (500 mg total) by mouth 2 (two) times daily for 4 days. Start taking on: April 10, 2022   docusate sodium 100 MG capsule Commonly known as: COLACE Take 100 mg by mouth daily as needed for mild constipation.   HYDROcodone-acetaminophen 5-325 MG tablet Commonly known as: NORCO/VICODIN Take by mouth.   hydroxypropyl methylcellulose / hypromellose 2.5 % ophthalmic solution Commonly known as: ISOPTO TEARS / GONIOVISC Place 1 drop into both eyes 4 (four) times daily as needed for dry eyes.   iron polysaccharides 150 MG capsule Commonly known as: NIFEREX Take 1 capsule (150 mg total) by mouth daily. Or can take any over-the-counter iron supplement.   latanoprost 0.005 % ophthalmic solution Commonly known as: XALATAN Place 1 drop into both eyes at bedtime.   levothyroxine 100 MCG tablet Commonly known as: SYNTHROID TAKE 1 TABLET BY MOUTH 30  MINUTES  BEFORE BREAKFAST What changed: See the new instructions.   metoprolol succinate 25 MG 24 hr tablet Commonly known as: TOPROL-XL Take 0.5 tablets (12.5 mg total) by mouth daily. May take additional 12.5 mg for A Fib.  Home med. What changed:  how much to take additional instructions   multivitamin with minerals Tabs tablet Take 1 tablet by mouth daily at 3 pm.   omeprazole 40 MG capsule Commonly known as: PRILOSEC TAKE 1 CAPSULE BY MOUTH DAILY   polyethylene glycol 17 g packet Commonly known as: MIRALAX / GLYCOLAX Take 17 g by mouth daily as needed for moderate constipation.   potassium chloride SA 20 MEQ tablet Commonly known as: KLOR-CON M Take 1 tablet (20 mEq total) by mouth daily for 7 days.   sertraline 100 MG tablet Commonly known as: ZOLOFT TAKE 1 TABLET BY MOUTH DAILY   timolol 0.5 % ophthalmic solution Commonly known as: BETIMOL Place 1 drop into both eyes 2 (two) times daily.   Vitamin D3 125 MCG (5000  UT) Tabs Take 5,000 Units by mouth daily.         Follow-up Information     [name], [name], MD Follow up in 1 week(s).   Specialty: Internal Medicine Contact information: [address] [place] [address] [phone]                 Allergies  Allergen Reactions   Tape Rash     The results of significant diagnostics from this hospitalization (including imaging, microbiology, ancillary and laboratory) are listed below for reference.   Consultations:   Procedures/Studies: DG Chest Port 1 View  Result Date: 04/07/2022 CLINICAL DATA:  Fever and possible UTI. History of breast cancer. EXAM: PORTABLE CHEST 1 VIEW COMPARISON:  December 30, 2020 FINDINGS: Electronic apparatus overlie the superior heart border. Cardiomediastinal silhouette is normal. Mediastinal contours appear intact. Calcific atherosclerotic disease and tortuosity of the aorta. There is no evidence of focal airspace consolidation, pleural effusion or pneumothorax. Mild  coarsening of interstitial markings. Osseous structures are without acute abnormality. Soft tissues are grossly normal. IMPRESSION: 1. Mild coarsening of the interstitial markings, which may be seen with pulmonary vascular congestion. 2. No definite focal airspace consolidation. Electronically Signed   By: Fidela Salisbury M.D.   On: 04/07/2022 13:04   CT Renal Stone Study  Result Date: 04/07/2022 CLINICAL DATA:  Flank pain. EXAM: CT ABDOMEN AND PELVIS WITHOUT CONTRAST TECHNIQUE: Multidetector CT imaging of the abdomen and pelvis was performed following the standard protocol without IV contrast. RADIATION DOSE REDUCTION: This exam was performed according to the departmental dose-optimization program which includes automated exposure control, adjustment of the mA and/or kV according to patient size and/or use of iterative reconstruction technique. COMPARISON:  November 26, 2019 FINDINGS: Lower chest: No acute abnormality. Hepatobiliary: No focal liver abnormality is seen. No gallstones, gallbladder wall thickening, or biliary dilatation. Pancreas: Unremarkable. No pancreatic ductal dilatation or surrounding inflammatory changes. Spleen: Normal in size without focal abnormality. Adrenals/Urinary Tract: Normal adrenal glands. Bilateral nonobstructive renal calculi. 2 mm nonobstructive calculus in the lower pole the right kidney. Four nonobstructive calculi in the left kidney with the largest measuring 4 mm in the lower pole. Stomach/Bowel: Stomach is within normal limits. Appendix appears normal. No evidence of bowel wall thickening, distention, or inflammatory changes. Vascular/Lymphatic: Aortic atherosclerosis. No enlarged abdominal or pelvic lymph nodes. Reproductive: Status post hysterectomy. No adnexal masses. Other: Focal mesenteric stranding with a few sub pathologic by CT criteria lymph nodes in the left central abdomen. The findings are best appreciated on the coronal view, image [id number] 7/92.  Musculoskeletal: Spondylosis of the lower lumbosacral spine. IMPRESSION: 1. Bilateral nonobstructive renal calculi. 2. Focal mesenteric stranding with a few interspersed sub pathologic by CT criteria lymph nodes in the left central abdomen. This finding is nonspecific, but may represent mesenteric lymphadenitis or panniculitis. 3. Aortic atherosclerosis. Aortic Atherosclerosis (ICD10-I70.0). Electronically Signed   By: Fidela Salisbury M.D.   On: 04/07/2022 11:25   CT ANKLE RIGHT WO CONTRAST  Result Date: 04/02/2022 CLINICAL DATA:  Right ankle pain after fall 2 days ago EXAM: CT OF THE RIGHT ANKLE WITHOUT CONTRAST TECHNIQUE: Multidetector CT imaging of the right ankle was performed according to the standard protocol. Multiplanar CT image reconstructions were also generated. RADIATION DOSE REDUCTION: This exam was performed according to the departmental dose-optimization program which includes automated exposure control, adjustment of the mA and/or kV according to patient size and/or use of iterative reconstruction technique. COMPARISON:  None Available. FINDINGS: Bones/Joint/Cartilage No acute fracture. No dislocation.  Ankle mortise is congruent. Degenerative subchondral cystic changes at the medial greater than lateral talar shoulders. No tibiotalar joint effusion. No intra-articular loose body. Mild joint space narrowing of the subtalar joints and within the midfoot. Ligaments Suboptimally assessed by CT. Muscles and Tendons No acute musculotendinous abnormality by CT. Soft tissues Mild soft tissue swelling, most pronounced at the lateral aspect of the ankle. IMPRESSION: 1. No acute fracture or dislocation of the right ankle. 2. Mild soft tissue swelling, most pronounced at the lateral aspect of the ankle. Electronically Signed   By: Davina Poke D.O.   On: 04/02/2022 10:01   CT HEAD WO CONTRAST (5MM)  Result Date: 04/02/2022 CLINICAL DATA:  Dizziness, persistent or recurrent EXAM: CT HEAD WITHOUT  CONTRAST TECHNIQUE: Contiguous axial images were obtained from the base of the skull through the vertex without intravenous contrast. RADIATION DOSE REDUCTION: This exam was performed according to the departmental dose-optimization program which includes automated exposure control, adjustment of the mA and/or kV according to patient size and/or use of iterative reconstruction technique. COMPARISON:  10/20/2020 FINDINGS: Brain: No evidence of acute infarction, hemorrhage, hydrocephalus, extra-axial collection or mass lesion/mass effect. Chronic small vessel disease with unchanged low-density in the deep white matter. Stable and age normal brain volume. Vascular: No hyperdense vessel or unexpected calcification. Skull: Normal. Negative for fracture or focal lesion. Sinuses/Orbits: No acute finding. IMPRESSION: No acute finding, stable from 2022. Electronically Signed   By: Jorje Guild M.D.   On: 04/02/2022 09:52      Labs: BNP (last 3 results) Recent Labs    04/07/22 1411  BNP 264.1*   Basic Metabolic Panel: Recent Labs  Lab 04/03/22 0437 04/07/22 1000 04/08/22 0533 04/09/22 0512  NA 140 134* 138 141  K 4.1 3.2* 3.6 3.8  CL 113* 103 107 111  CO2 23 24 26 24   GLUCOSE 103* 115* 102* 97  BUN 10 14 16 12   CREATININE 0.57 0.66 0.67 0.55  CALCIUM 8.6* 8.4* 8.2* 8.5*  MG  --   --   --  2.3   Liver Function Tests: Recent Labs  Lab 04/07/22 1000 04/08/22 0533  AST 43* 59*  ALT 33 44  ALKPHOS 128* 159*  BILITOT 1.3* 1.0  PROT 6.9 6.2*  ALBUMIN 3.4* 2.9*   No results for input(s): "LIPASE", "AMYLASE" in the last 168 hours. No results for input(s): "AMMONIA" in the last 168 hours. CBC: Recent Labs  Lab 04/03/22 0437 04/07/22 1000 04/08/22 0533 04/09/22 0512  WBC 5.3 7.2 6.1 5.2  HGB 9.7* 9.1* 8.9* 9.6*  HCT 31.2* 29.2* 28.0* 30.1*  MCV 85.7 85.4 84.1 85.0  PLT 201 228 239 276   Cardiac Enzymes: No results for input(s): "CKTOTAL", "CKMB", "CKMBINDEX", "TROPONINI" in the  last 168 hours. BNP: Invalid input(s): "POCBNP" CBG: No results for input(s): "GLUCAP" in the last 168 hours. D-Dimer No results for input(s): "DDIMER" in the last 72 hours. Hgb A1c Recent Labs    04/08/22 0533  HGBA1C 6.0*   Lipid Profile No results for input(s): "CHOL", "HDL", "LDLCALC", "TRIG", "CHOLHDL", "LDLDIRECT" in the last 72 hours. Thyroid function studies No results for input(s): "TSH", "T4TOTAL", "T3FREE", "THYROIDAB" in the last 72 hours.  Invalid input(s): "FREET3" Anemia work up Recent Labs    04/07/22 1411  FERRITIN 87  TIBC 325  IRON 13*   Urinalysis    Component Value Date/Time   COLORURINE AMBER (A) 04/07/2022 1000   APPEARANCEUR CLEAR (A) 04/07/2022 1000   LABSPEC 1.021 04/07/2022 1000  PHURINE 5.0 04/07/2022 1000   GLUCOSEU NEGATIVE 04/07/2022 1000   HGBUR NEGATIVE 04/07/2022 1000   BILIRUBINUR NEGATIVE 04/07/2022 1000   BILIRUBINUR Negative 11/24/2019 1352   KETONESUR NEGATIVE 04/07/2022 1000   PROTEINUR 30 (A) 04/07/2022 1000   UROBILINOGEN 0.2 11/24/2019 1352   NITRITE POSITIVE (A) 04/07/2022 1000   LEUKOCYTESUR TRACE (A) 04/07/2022 1000   Sepsis Labs Recent Labs  Lab 04/03/22 0437 04/07/22 1000 04/08/22 0533 04/09/22 0512  WBC 5.3 7.2 6.1 5.2   Microbiology Recent Results (from the past 240 hour(s))  Urine Culture     Status: Abnormal   Collection Time: 04/02/22 10:47 AM   Specimen: Urine, Clean Catch  Result Value Ref Range Status   Specimen Description   Final    URINE, CLEAN CATCH Performed at [facility], [address] [place], [place] [address]    Special Requests   Final    NONE Performed at [facility], [address] [place], [place] [address]    Culture >=100,000 COLONIES/mL ESCHERICHIA COLI (A)  Final   Report Status 04/04/2022 FINAL  Final   Organism ID, Bacteria ESCHERICHIA COLI (A)  Final      Susceptibility   Escherichia coli - MIC*    AMPICILLIN >=32 RESISTANT Resistant      CEFAZOLIN <=4 SENSITIVE Sensitive     CEFEPIME <=0.12 SENSITIVE Sensitive     CEFTRIAXONE <=0.25 SENSITIVE Sensitive     CIPROFLOXACIN <=0.25 SENSITIVE Sensitive     GENTAMICIN <=1 SENSITIVE Sensitive     IMIPENEM <=0.25 SENSITIVE Sensitive     NITROFURANTOIN <=16 SENSITIVE Sensitive     TRIMETH/SULFA <=20 SENSITIVE Sensitive     AMPICILLIN/SULBACTAM >=32 RESISTANT Resistant     PIP/TAZO <=4 SENSITIVE Sensitive     * >=100,000 COLONIES/mL ESCHERICHIA COLI  Resp Panel by RT-PCR (Flu A&B, Covid) Anterior Nasal Swab     Status: None   Collection Time: 04/07/22 11:27 AM   Specimen: Anterior Nasal Swab  Result Value Ref Range Status   SARS Coronavirus 2 by RT PCR NEGATIVE NEGATIVE Final    Comment: (NOTE) SARS-CoV-2 target nucleic acids are NOT DETECTED.  The SARS-CoV-2 RNA is generally detectable in upper respiratory specimens during the acute phase of infection. The lowest concentration of SARS-CoV-2 viral copies this assay can detect is 138 copies/mL. A negative result does not preclude SARS-Cov-2 infection and should not be used as the sole basis for treatment or other patient management decisions. A negative result may occur with  improper specimen collection/handling, submission of specimen other than nasopharyngeal swab, presence of viral mutation(s) within the areas targeted by this assay, and inadequate number of viral copies(<138 copies/mL). A negative result must be combined with clinical observations, patient history, and epidemiological information. The expected result is Negative.  Fact Sheet for Patients:  EntrepreneurPulse.com.au  Fact Sheet for Healthcare Providers:  IncredibleEmployment.be  This test is no t yet approved or cleared by the [place] FDA and  has been authorized for detection and/or diagnosis of SARS-CoV-2 by FDA under an Emergency Use Authorization (EUA). This EUA will remain  in effect (meaning this test can  be used) for the duration of the COVID-19 declaration under Section 564(b)(1) of the Act, 21 U.S.C.section 360bbb-3(b)(1), unless the authorization is terminated  or revoked sooner.       Influenza A by PCR NEGATIVE NEGATIVE Final   Influenza B by PCR NEGATIVE NEGATIVE Final    Comment: (NOTE) The Xpert Xpress SARS-CoV-2/FLU/RSV plus assay is intended as  an aid in the diagnosis of influenza from Nasopharyngeal swab specimens and should not be used as a sole basis for treatment. Nasal washings and aspirates are unacceptable for Xpert Xpress SARS-CoV-2/FLU/RSV testing.  Fact Sheet for Patients: EntrepreneurPulse.com.au  Fact Sheet for Healthcare Providers: IncredibleEmployment.be  This test is not yet approved or cleared by the [place] FDA and has been authorized for detection and/or diagnosis of SARS-CoV-2 by FDA under an Emergency Use Authorization (EUA). This EUA will remain in effect (meaning this test can be used) for the duration of the COVID-19 declaration under Section 564(b)(1) of the Act, 21 U.S.C. section 360bbb-3(b)(1), unless the authorization is terminated or revoked.  Performed at [facility], [address] [place], [place] [address]   Blood culture (single)     Status: None (Preliminary result)   Collection Time: 04/07/22 11:27 AM   Specimen: BLOOD LEFT ARM  Result Value Ref Range Status   Specimen Description BLOOD LEFT ARM  Final   Special Requests   Final    BOTTLES DRAWN AEROBIC AND ANAEROBIC Blood Culture adequate volume   Culture   Final    NO GROWTH 2 DAYS Performed at [facility], [address] [place],  [address]    Report Status PENDING  Incomplete     Total time spend on discharging this patient, including the last patient exam, discussing the hospital stay, instructions for ongoing care as it relates to all pertinent caregivers, as well as preparing the medical discharge  records, prescriptions, and/or referrals as applicable, is 35 minutes.    Enzo Bi, MD  [facility] 04/09/2022, 8:32 AM

## 2022-04-09 NOTE — TOC Transition Note (Signed)
Transition of Care [facility]) - CM/SW Discharge Note   Patient Details  Name: Sandra [name] MRN: [id number] Date of Birth: Nov 14, 1945  [facility] CM/SW Contact:  Candie Chroman, LCSW Phone Number: 04/09/2022, 9:43 AM   Clinical Narrative:  Patient has orders to discharge home today. CSW met with patient. Daughter at bedside. CSW introduced role and explained that therapy recommendations would be discussed. They are agreeable to home health. No agency preference. Patient has outpatient procedure scheduled with Dr. [name] on Tuesday. [facility] can accept for PT and OT. Start of care Wednesday or Thursday. Patient and daughter are aware. No further concerns. CSW signing off.   Final next level of care: Home w Home Health Services Barriers to Discharge: No Barriers Identified   Patient Goals and CMS Choice   CMS Medicare.gov Compare Post Acute Care list provided to:: Patient (Daughter at bedside) Choice offered to / list presented to : Patient, Adult Children  Discharge Placement                Patient to be transferred to facility by: Daughter Name of family member notified: Carloyn Manner Patient and family notified of of transfer: 04/09/22  Discharge Plan and Services                          HH Arranged: PT, OT [facility] Agency: [place] ([place]) Date HH Agency Contacted: 04/09/22   Representative spoke with at [place]: Floydene Flock  Social Determinants of Health (SDOH) Interventions     Readmission Risk Interventions     No data to display

## 2022-04-09 NOTE — Progress Notes (Signed)
Sandra [name] to be D/C'd Home per MD order.  Discussed prescriptions and follow up appointments with the patient. Prescriptions given to patient, medication list explained in detail. Pt verbalized understanding.  Allergies as of 04/09/2022       Reactions   Tape Rash        Medication List     STOP taking these medications    fluconazole 150 MG tablet Commonly known as: DIFLUCAN   nitrofurantoin 100 MG capsule Commonly known as: MACRODANTIN   traMADol 50 MG tablet Commonly known as: ULTRAM   Urelle 81 MG Tabs tablet       TAKE these medications    acetaminophen 325 MG tablet Commonly known as: TYLENOL Take 2 tablets (650 mg total) by mouth every 6 (six) hours as needed for mild pain (or Fever >/= 101).   ALPRAZolam 0.5 MG tablet Commonly known as: XANAX Take 1 tablet (0.5 mg total) by mouth at bedtime as needed. for sleep   amLODipine 10 MG tablet Commonly known as: NORVASC Take 0.5 tablets (5 mg total) by mouth daily. Home med What changed:  how much to take additional instructions   aspirin EC 81 MG tablet Take 1 tablet (81 mg total) by mouth daily. Swallow whole.   atorvastatin 40 MG tablet Commonly known as: LIPITOR TAKE 1 TABLET BY MOUTH DAILY   bisacodyl 5 MG EC tablet Generic drug: bisacodyl Take 5 mg by mouth daily as needed for moderate constipation.   CALCIUM 600 + D PO Take 1 tablet by mouth daily.   ciprofloxacin 500 MG tablet Commonly known as: Cipro Take 1 tablet (500 mg total) by mouth 2 (two) times daily for 4 days. Start taking on: April 10, 2022   docusate sodium 100 MG capsule Commonly known as: COLACE Take 100 mg by mouth daily as needed for mild constipation.   HYDROcodone-acetaminophen 5-325 MG tablet Commonly known as: NORCO/VICODIN Take by mouth.   hydroxypropyl methylcellulose / hypromellose 2.5 % ophthalmic solution Commonly known as: ISOPTO TEARS / GONIOVISC Place 1 drop into both eyes 4 (four) times daily as  needed for dry eyes.   iron polysaccharides 150 MG capsule Commonly known as: NIFEREX Take 1 capsule (150 mg total) by mouth daily. Or can take any over-the-counter iron supplement.   latanoprost 0.005 % ophthalmic solution Commonly known as: XALATAN Place 1 drop into both eyes at bedtime.   levothyroxine 100 MCG tablet Commonly known as: SYNTHROID TAKE 1 TABLET BY MOUTH 30  MINUTES BEFORE BREAKFAST What changed: See the new instructions.   metoprolol succinate 25 MG 24 hr tablet Commonly known as: TOPROL-XL Take 0.5 tablets (12.5 mg total) by mouth daily. May take additional 12.5 mg for A Fib.  Home med. What changed:  how much to take additional instructions   multivitamin with minerals Tabs tablet Take 1 tablet by mouth daily at 3 pm.   omeprazole 40 MG capsule Commonly known as: PRILOSEC TAKE 1 CAPSULE BY MOUTH DAILY   polyethylene glycol 17 g packet Commonly known as: MIRALAX / GLYCOLAX Take 17 g by mouth daily as needed for moderate constipation.   potassium chloride SA 20 MEQ tablet Commonly known as: KLOR-CON M Take 1 tablet (20 mEq total) by mouth daily for 7 days.   sertraline 100 MG tablet Commonly known as: ZOLOFT TAKE 1 TABLET BY MOUTH DAILY   timolol 0.5 % ophthalmic solution Commonly known as: BETIMOL Place 1 drop into both eyes 2 (two) times daily.   Vitamin  D3 125 MCG (5000 UT) Tabs Take 5,000 Units by mouth daily.        Vitals:   04/09/22 0537 04/09/22 0726  BP: 138/68 (!) 146/70  Pulse: 74 70  Resp: 18 15  Temp: 98.7 F (37.1 C) 98.2 F (36.8 C)  SpO2: 95% 95%    Skin clean, dry and intact without evidence of skin break down, no evidence of skin tears noted. IV catheter discontinued intact. Site without signs and symptoms of complications. Dressing and pressure applied. Pt denies pain at this time. No complaints noted.  An After Visit Summary was printed and given to the patient. Patient escorted via [place], and D/C home via private  auto.  Fuller Mandril, RN

## 2022-04-09 NOTE — Progress Notes (Signed)
Physical Therapy Treatment Patient Details Name: Sandra [name] MRN: [id number] DOB: Nov 21, 1945 Today's Date: 04/09/2022   History of Present Illness Sandra [name] is a 76 y.o. female with medical history significant of HFpEF with a EF of 60-65%, paroxysmal atrial fibrillation s/p watchman's, hemorrhagic CVA, nonobstructive CAD, celiac disease, hypothyroidism, Raynaud's, prediabetes, who presents to the ED with complaints of fever in the setting of pyelonephritis. She was recently admitted from 11/4 -11/6 due to recurrent falls complicated by nondisplaced fracture of cuboid bone of right foot and right comminuted distal radius and ulnar fracture.    PT Comments    Pt in bed, ready to get up.  She is able to get to EOB with min guard.  Assist to don CAM boot.  She is able to stand with verbal cues for hand placements and does have post LOB which would have resulted in fall without Probation officer intervention.  She is able to walk 100' to nursing station with QC and min guard/assist +1 with several LOB's that likely would have resulted in fall without intervention.  Long discussion with pt regarding discharge plan.  She stated she will be retuning home.  She is primary caregiver to husband.  Stated daughter will need to go back to [facility] tomorrow and she is an Publishing copy" who is having people from church check in on her.  Recommend and reviewed with pt that she does need 24 hour care if returning home.  Husband is on O2 and frequently falls on his O2 cord and is unable to provide any assist.  She stated she will be ok at home. Again reviewed recommendations of 24 hour hands on care if she does opt to return home.  She is at high risk for falls and readmission if she chooses to return home without proper supports in place.  Discussed with RN.   Recommendations for follow up therapy are one component of a multi-disciplinary discharge planning process, led by the attending physician.  Recommendations may be updated  based on patient status, additional functional criteria and insurance authorization.  Follow Up Recommendations  Skilled nursing-short term rehab (<3 hours/day) Can patient physically be transported by private vehicle: Yes   Assistance Recommended at Discharge Frequent or constant Supervision/Assistance  Patient can return home with the following A little help with walking and/or transfers;A little help with bathing/dressing/bathroom;Assist for transportation;Help with stairs or ramp for entrance   Equipment Recommendations       Recommendations for Other Services       Precautions / Restrictions Precautions Precautions: Fall Precaution Comments: [place] for RLE, RUE in splint Restrictions Weight Bearing Restrictions: Yes RUE Weight Bearing: Non weight bearing RLE Weight Bearing: Weight bearing as tolerated     Mobility  Bed Mobility Overal bed mobility: Needs Assistance Bed Mobility: Supine to Sit     Supine to sit: Min guard          Transfers Overall transfer level: Needs assistance Equipment used: Quad cane Transfers: Sit to/from Stand Sit to Stand: Min assist           General transfer comment: vc;s for hand placements and min assist for balance upon standing    Ambulation/Gait Ambulation/Gait assistance: Min assist Gait Distance (Feet): 100 Feet   Gait Pattern/deviations: Step-to pattern, Decreased stance time - right, Decreased step length - left Gait velocity: decreased velocity     General Gait Details: Pt lost balance to left several times requiring PT to provide CGA  for her to regain  balance   Stairs             Wheelchair Mobility    Modified Rankin (Stroke Patients Only)       Balance Overall balance assessment: Needs assistance, History of Falls Sitting-balance support: Feet supported, Single extremity supported Sitting balance-Leahy Scale: Good     Standing balance support: Single extremity supported Standing  balance-Leahy Scale: Fair Standing balance comment: frequent LOB's managed by Probation officer                            Cognition Arousal/Alertness: Awake/alert Behavior During Therapy: WFL for tasks assessed/performed Overall Cognitive Status: Within Functional Limits for tasks assessed                                 General Comments: unrealistic over abilities upon discharge.        Exercises      General Comments        Pertinent Vitals/Pain Pain Assessment Pain Assessment: No/denies pain    Home Living                          Prior Function            PT Goals (current goals can now be found in the care plan section) Progress towards PT goals: Progressing toward goals    Frequency    Min 3X/week      PT Plan      Co-evaluation              AM-PAC PT "6 Clicks" Mobility   Outcome Measure  Help needed turning from your back to your side while in a flat bed without using bedrails?: None Help needed moving from lying on your back to sitting on the side of a flat bed without using bedrails?: None Help needed moving to and from a bed to a chair (including a wheelchair)?: A Little Help needed standing up from a chair using your arms (e.g., wheelchair or bedside chair)?: A Little Help needed to walk in hospital room?: A Little Help needed climbing 3-5 steps with a railing? : A Lot 6 Click Score: 19    End of Session Equipment Utilized During Treatment: Gait belt Activity Tolerance: Patient tolerated treatment well Patient left: in chair;with nursing/sitter in room;with call bell/phone within reach;with chair alarm set Nurse Communication: Mobility status PT Visit Diagnosis: Unsteadiness on feet (R26.81);Muscle weakness (generalized) (M62.81);History of falling (Z91.81);Difficulty in walking, not elsewhere classified (R26.2);Pain;Repeated falls (R29.6) Pain - Right/Left: Right Pain - part of body: Leg     Time:  3888-2800 PT Time Calculation (min) (ACUTE ONLY): 24 min  Charges:  $Gait Training: 23-37 mins                   Chesley Noon, PTA 04/09/22, 9:00 AM

## 2022-04-11 ENCOUNTER — Other Ambulatory Visit: Payer: Self-pay

## 2022-04-11 ENCOUNTER — Ambulatory Visit
Admission: RE | Admit: 2022-04-11 | Discharge: 2022-04-11 | Disposition: A | Discharge status: Home / Self Care | Payer: Medicare Other | Attending: Orthopedic Surgery | Admitting: Orthopedic Surgery

## 2022-04-11 ENCOUNTER — Ambulatory Visit: Payer: Medicare Other | Admitting: Anesthesiology

## 2022-04-11 ENCOUNTER — Ambulatory Visit
Admission: RE | Disposition: A | Discharge status: Home / Self Care | Payer: Self-pay | Source: Home / Self Care | Attending: Orthopedic Surgery

## 2022-04-11 ENCOUNTER — Encounter: Payer: Self-pay | Admitting: Orthopedic Surgery

## 2022-04-11 DIAGNOSIS — K219 Gastro-esophageal reflux disease without esophagitis: Secondary | ICD-10-CM | POA: Diagnosis not present

## 2022-04-11 DIAGNOSIS — I10 Essential (primary) hypertension: Secondary | ICD-10-CM | POA: Diagnosis not present

## 2022-04-11 DIAGNOSIS — S6991XA Unspecified injury of right wrist, hand and finger(s), initial encounter: Secondary | ICD-10-CM | POA: Insufficient documentation

## 2022-04-11 DIAGNOSIS — F418 Other specified anxiety disorders: Secondary | ICD-10-CM | POA: Diagnosis not present

## 2022-04-11 DIAGNOSIS — Z8673 Personal history of transient ischemic attack (TIA), and cerebral infarction without residual deficits: Secondary | ICD-10-CM | POA: Diagnosis not present

## 2022-04-11 DIAGNOSIS — S52501A Unspecified fracture of the lower end of right radius, initial encounter for closed fracture: Secondary | ICD-10-CM | POA: Insufficient documentation

## 2022-04-11 DIAGNOSIS — D759 Disease of blood and blood-forming organs, unspecified: Secondary | ICD-10-CM | POA: Diagnosis not present

## 2022-04-11 DIAGNOSIS — I4891 Unspecified atrial fibrillation: Secondary | ICD-10-CM | POA: Insufficient documentation

## 2022-04-11 DIAGNOSIS — S52611A Displaced fracture of right ulna styloid process, initial encounter for closed fracture: Secondary | ICD-10-CM | POA: Insufficient documentation

## 2022-04-11 DIAGNOSIS — E039 Hypothyroidism, unspecified: Secondary | ICD-10-CM | POA: Diagnosis not present

## 2022-04-11 DIAGNOSIS — D649 Anemia, unspecified: Secondary | ICD-10-CM | POA: Diagnosis not present

## 2022-04-11 DIAGNOSIS — I7300 Raynaud's syndrome without gangrene: Secondary | ICD-10-CM | POA: Diagnosis not present

## 2022-04-11 DIAGNOSIS — W19XXXA Unspecified fall, initial encounter: Secondary | ICD-10-CM | POA: Diagnosis not present

## 2022-04-11 HISTORY — DX: Presence of external hearing-aid: Z97.4

## 2022-04-11 HISTORY — PX: OPEN REDUCTION INTERNAL FIXATION (ORIF) DISTAL RADIAL FRACTURE: SHX5989

## 2022-04-11 HISTORY — DX: Celiac disease: K90.0

## 2022-04-11 HISTORY — DX: Fatty (change of) liver, not elsewhere classified: K76.0

## 2022-04-11 HISTORY — DX: Prediabetes: R73.03

## 2022-04-11 HISTORY — DX: Age-related osteoporosis without current pathological fracture: M81.0

## 2022-04-11 SURGERY — OPEN REDUCTION INTERNAL FIXATION (ORIF) DISTAL RADIUS FRACTURE
Anesthesia: General | Site: Wrist | Laterality: Right

## 2022-04-11 MED ORDER — CEFAZOLIN SODIUM-DEXTROSE 2-4 GM/100ML-% IV SOLN
2.0000 g | INTRAVENOUS | Status: AC
  Administered 2022-04-11: 2 g via INTRAVENOUS

## 2022-04-11 MED ORDER — ONDANSETRON HCL 4 MG/2ML IJ SOLN
INTRAMUSCULAR | Status: DC | PRN
  Administered 2022-04-11: 4 mg via INTRAVENOUS

## 2022-04-11 MED ORDER — LIDOCAINE HCL (CARDIAC) PF 100 MG/5ML IV SOSY
PREFILLED_SYRINGE | INTRAVENOUS | Status: DC | PRN
  Administered 2022-04-11: 80 mg via INTRATRACHEAL

## 2022-04-11 MED ORDER — DEXAMETHASONE SODIUM PHOSPHATE 4 MG/ML IJ SOLN
INTRAMUSCULAR | Status: DC | PRN
  Administered 2022-04-11: 4 mg via INTRAVENOUS

## 2022-04-11 MED ORDER — FENTANYL CITRATE PF 50 MCG/ML IJ SOSY
25.0000 ug | PREFILLED_SYRINGE | INTRAMUSCULAR | Status: DC | PRN
  Administered 2022-04-11 (×2): 25 ug via INTRAVENOUS
  Administered 2022-04-11: 50 ug via INTRAVENOUS

## 2022-04-11 MED ORDER — 0.9 % SODIUM CHLORIDE (POUR BTL) OPTIME
TOPICAL | Status: DC | PRN
  Administered 2022-04-11: 60 mL

## 2022-04-11 MED ORDER — LIDOCAINE HCL (PF) 1 % IJ SOLN
INTRAMUSCULAR | Status: DC | PRN
  Administered 2022-04-11: 3 mL via SUBCUTANEOUS

## 2022-04-11 MED ORDER — LACTATED RINGERS IV SOLN: INTRAVENOUS | Status: DC

## 2022-04-11 MED ORDER — OXYCODONE HCL 5 MG/5ML PO SOLN: 5.0000 mg | Freq: Once | ORAL | Status: AC | PRN

## 2022-04-11 MED ORDER — PROPOFOL 10 MG/ML IV BOLUS
INTRAVENOUS | Status: DC | PRN
  Administered 2022-04-11: 100 mg via INTRAVENOUS

## 2022-04-11 MED ORDER — OXYCODONE HCL 5 MG PO TABS
5.0000 mg | ORAL_TABLET | Freq: Once | ORAL | Status: AC | PRN
  Administered 2022-04-11: 5 mg via ORAL

## 2022-04-11 MED ORDER — ROPIVACAINE HCL 5 MG/ML IJ SOLN
INTRAMUSCULAR | Status: DC | PRN
  Administered 2022-04-11: 15 mL via PERINEURAL

## 2022-04-11 MED ORDER — BUPIVACAINE HCL 0.5 % IJ SOLN
INTRAMUSCULAR | Status: DC | PRN
  Administered 2022-04-11: 10 mL

## 2022-04-11 MED ORDER — FENTANYL CITRATE (PF) 100 MCG/2ML IJ SOLN
INTRAMUSCULAR | Status: DC | PRN
  Administered 2022-04-11: 50 ug via INTRAVENOUS
  Administered 2022-04-11 (×2): 25 ug via INTRAVENOUS

## 2022-04-11 MED ORDER — MIDAZOLAM HCL 5 MG/5ML IJ SOLN
INTRAMUSCULAR | Status: DC | PRN
  Administered 2022-04-11: 2 mg via INTRAVENOUS

## 2022-04-11 SURGICAL SUPPLY — 38 items
ADH SKN CLS APL DERMABOND .7 (GAUZE/BANDAGES/DRESSINGS) ×1
APL PRP STRL LF DISP 70% ISPRP (MISCELLANEOUS) ×1
BIT DRILL 2 FAST STEP (BIT) IMPLANT
BIT DRILL 2.5X4 QC (BIT) IMPLANT
BNDG ELASTIC 4X5.8 VLCR STR LF (GAUZE/BANDAGES/DRESSINGS) ×1 IMPLANT
CHLORAPREP W/TINT 26 (MISCELLANEOUS) ×1 IMPLANT
COVER LIGHT HANDLE UNIVERSAL (MISCELLANEOUS) ×2 IMPLANT
DERMABOND ADVANCED .7 DNX12 (GAUZE/BANDAGES/DRESSINGS) IMPLANT
DRAPE FLUOR MINI C-ARM 54X84 (DRAPES) ×1 IMPLANT
DRSG TELFA 3X4 N-ADH STERILE (GAUZE/BANDAGES/DRESSINGS) IMPLANT
ELECT REM PT RETURN 9FT ADLT (ELECTROSURGICAL) ×1
ELECTRODE REM PT RTRN 9FT ADLT (ELECTROSURGICAL) ×1 IMPLANT
GAUZE SPONGE 4X4 12PLY STRL (GAUZE/BANDAGES/DRESSINGS) ×1 IMPLANT
GLOVE SURG SYN 9.0  PF PI (GLOVE) ×1
GLOVE SURG SYN 9.0 PF PI (GLOVE) ×1 IMPLANT
GOWN STRL REUS W/ TWL LRG LVL3 (GOWN DISPOSABLE) ×1 IMPLANT
GOWN STRL REUS W/TWL LRG LVL3 (GOWN DISPOSABLE) ×2
K-WIRE 1.6 (WIRE) ×1
K-WIRE FX5X1.6XNS BN SS (WIRE) ×1
KIT TURNOVER KIT A (KITS) ×1 IMPLANT
KWIRE FX5X1.6XNS BN SS (WIRE) IMPLANT
NS IRRIG 500ML POUR BTL (IV SOLUTION) ×1 IMPLANT
PACK EXTREMITY ARMC (MISCELLANEOUS) ×1 IMPLANT
PAD CAST 4YDX4 CTTN HI CHSV (CAST SUPPLIES) ×2 IMPLANT
PADDING CAST COTTON 4X4 STRL (CAST SUPPLIES) ×1
PEG SUBCHONDRAL SMOOTH 2.0X14 (Peg) IMPLANT
PEG SUBCHONDRAL SMOOTH 2.0X16 (Peg) IMPLANT
PEG SUBCHONDRAL SMOOTH 2.0X18 (Peg) IMPLANT
PEG SUBCHONDRAL SMOOTH 2.0X20 (Peg) IMPLANT
PLATE SHORT 21.6X48.9 NRRW RT (Plate) IMPLANT
SCREW CORT 3.5X10 LNG (Screw) IMPLANT
SPLINT CAST 1 STEP 3X12 (MISCELLANEOUS) ×1 IMPLANT
SUT MNCRL 4-0 (SUTURE) ×1
SUT MNCRL 4-0 27XMFL (SUTURE) ×1
SUT VIC AB 3-0 SH 27 (SUTURE) ×1
SUT VIC AB 3-0 SH 27X BRD (SUTURE) IMPLANT
SUTURE MNCRL 4-0 27XMF (SUTURE) IMPLANT
WATER STERILE IRR 500ML POUR (IV SOLUTION) ×1 IMPLANT

## 2022-04-11 NOTE — Discharge Instructions (Addendum)
Keep arm elevated is much as possible till recheck. Work on finger motion all you can trying to make a fist and fully straighten fingers Ice to the back of the wrist today and tomorrow may help with pain Numbing medicine was applied so you may be numb in the hand for 6 to 8 hours Call office if you are having any problems [phone]

## 2022-04-11 NOTE — Op Note (Signed)
04/11/2022  12:55 PM  PATIENT:  Sandra [name]  76 y.o. female  PRE-OPERATIVE DIAGNOSIS:  Closed fracture of distal end of right radius, unspecified fracture morphology, initial encounter S52.501A Right wrist injury, initial encounter S69.91XA Closed displaced fracture of styloid process of right ulna, initial encounter S52.611A  POST-OPERATIVE DIAGNOSIS:  Closed fracture of distal end of right radius, unspecified fracture morphology, initial encounter S52.501ARight wrist injury, initial encounter S69.91XAClosed displaced fracture of styloid process of right ulna, initial encounter S52.611A  PROCEDURE:  Procedure(s): OPEN REDUCTION INTERNAL FIXATION (ORIF) DISTAL RADIUS FRACTURE (Right)  SURGEON: Laurene Footman, MD  ASSISTANTS: None  ANESTHESIA:   general  EBL:  Total I/O In: 100 [IV Piggyback:100] Out: -   BLOOD ADMINISTERED:none  DRAINS: none   LOCAL MEDICATIONS USED:  MARCAINE     SPECIMEN:  No Specimen  DISPOSITION OF SPECIMEN:  N/A  COUNTS:  YES  TOURNIQUET:   Total Tourniquet Time Documented: Upper Arm (Right) - 19 minutes Total: Upper Arm (Right) - 19 minutes   IMPLANTS: Hand innovations short narrow DVR plate with multiple screws and smooth pegs  DICTATION: .Dragon Dictation  patient was brought to the operating room and after the arm was prepped and draped in the usual sterile fashion after general anesthesia was obtained,, tourniquet having been applied to the upper arm appropriate patient identification and timeout procedures were completed.  Tourniquet was raised and a volar approach was made after traction was applied at the end of the table through fingertrap traction.  Approach is center of the FCR tendon.  Tendon sheath incised and the tendon retracted radially protect the radial artery and associated veins.  The deep fascia was incised and the pronator was elevated off the distal and proximal fragments with exposure of the fracture site.  The fracture was  quite comminuted with 3 distal fragments.  A Freer elevator was used to separate the fragments to get adequate alignment with a short standard DVR plate applied with distal first technique using a K wire to hold the plate in position position of the plate was checked in both AP and lateral projections.  When acceptable position was obtained the smooth pegs were placed drilling measuring and placing the smooth pegs in the distal fragment along with multiple multidirectional screws to make sure there is adequate fixation in the distal fragment, following this the shaft portion of the plate was brought down to the bone and 3 10 mm cortical screws were sequentially inserted they gave an essentially anatomic alignment in both AP and lateral projections.  Traction was removed and under fluoroscopic views there is no motion of the fracture.   At this point local anesthetic was infiltrated proximal to the skin incision and the wound irrigated with tourniquet let down.  Wound was then closed with 3-0 Vicryl for the deep tissue and a running 4-0 Monocryl subcuticular closure followed by Dermabond.  Dressing of 4 x 4's web roll and volar splint followed by Ace wrap applied.  PLAN OF CARE: Discharge to home after PACU  PATIENT DISPOSITION:  PACU - hemodynamically stable.

## 2022-04-11 NOTE — H&P (Signed)
Chief Complaint Patient presents with Right Wrist - Pain Right Elbow - Pain   History of the Present Illness: Sandra [name] is a 76 y.o. female here today.  The patient presents for evaluation of right wrist pain. She suffered a fall on 03/31/2022. She was seen in our walk-in clinic and has a comminuted distal radius fracture with a plan for ORIF tomorrow. She is having right elbow pain as well and comes in for x-ray of that to make sure there is not an additional injury that needs repair. X-ray of the right elbow today.  The patient presents with a female companion who states the patient is out of breath, and she has chills right now. She is a little bit clumsy at baseline, and she has had more falls since her fall in 06/2021. She was heavily advised to use a cane, but she does not use a cane due to balance issues. She presents with a splint on her right elbow.  The patient states she has had Raynaud's disease. She has a tiny fracture around the cuboid of her right foot.  . Her companion states the patient does not take the Lasix very often because her potassium is low. She is on Macrobid for a UTI. She has atrial fibrillation.  The patient lives in [place], [place] with her husband. She has 3 steps to get into the house. She is the primary caregiver for her husband who has severe COPD and mild long oxygen.  I have reviewed past medical, surgical, social and family history, and allergies as documented in the EMR.  Past Medical History: Past Medical History: Diagnosis Date Anemia Anxiety disorder Atrial fibrillation, new onset (CMS-HCC) 2021 BCC (basal cell carcinoma of skin) Breast cancer, right (CMS-HCC) 10/16/2016 T1c,N0 (1.6 cm); ER/PR +; Her 2 neu not overexpressed. BCI testing January 06, 2022: No benefit from extended antiestrogen therapy. CAD (coronary artery disease) non-obstructive Cardiac murmur Celiac disease Chronic constipation 04/15/2015 Colon polyp GERD (gastroesophageal  reflux disease) H/O adenomatous polyp of colon 04/15/2015 History of stroke 06/2020 Hypertension Hypothyroidism (acquired) Osteoarthritis Prediabetes Raynaud's disease RLS (restless legs syndrome)  Past Surgical History: Past Surgical History: Procedure Laterality Date COLONOSCOPY 04/29/1986 [facility] Rectal Adenomatous Polyp seen on Flex Sig COLONOSCOPY 11/15/1989 [place] Adenomatous Polyp EGD 08/06/1995 COLONOSCOPY 08/06/1995 [facility] Adenomatous Polyp COLONOSCOPY 12/04/2003 Adenomatous Polyps EGD 01/12/2006 COLONOSCOPY 01/12/2006 Adenomatous Polyps EGD 04/22/2012 COLONOSCOPY 04/22/2012 Adenomatous Polyps: CBF 03/2015; Recall Ltr mailed 03/03/2015 (dw) COLONOSCOPY 05/19/2015 Adenomatous Polyps: CBF 04/2018 MASTECTOMY SIMPLE Right 11/07/2016 with axillary SLN biopsy COLONOSCOPY 12/06/2017 Adenomatous Polyps: CBF 11/2019 EGD 12/06/2017 Gastritis: No repeat per RTE COLONOSCOPY 08/01/2019 [place] atrial appendage occlusion Left 01/27/2021 tee with cardioversion 01/27/2021 ABDOMINAL HYSTERECTOMY APPENDECTOMY CATARACT EXTRACTION Bilateral CESAREAN SECTION COLONOSCOPY hand surgery Left POLYPECTOMY REPEAT CESAREAN SECTION TONSILLECTOMY age 65 UPPER GASTROINTESTINAL ENDOSCOPY VITREOUS RETINAL SURGERY  Past Family History: Family History Problem Relation Age of Onset Lymphoma Mother Anemia Mother Alcohol abuse Father Prostate cancer Father Cancer Sister bile duct Breast cancer Sister 43 half sister; shared mother Stroke Maternal Grandmother Breast cancer Maternal Grandmother 68 Myocardial Infarction (Heart attack) Maternal Grandfather Coronary Artery Disease (Blocked arteries around heart) Paternal Grandfather Headaches Daughter Arthritis Son  Medications: Current Outpatient Medications Ordered in Epic Medication Sig Dispense Refill acetaminophen (TYLENOL) 325 MG tablet Take 2 tablets by mouth every 6 (six) hours as needed for Pain ALPRAZolam (XANAX) 0.5 MG tablet Take 1  tablet by mouth nightly as needed. amLODIPine (NORVASC) 5 MG tablet Take 2.5 mg by mouth once daily artificial tears (GONIOSCOP-HPM)  2.5 % ophthalmic solution Apply to eye aspirin 81 MG EC tablet Take 81 mg by mouth once daily atorvastatin (LIPITOR) 40 MG tablet Take 1 tablet by mouth once daily bisacodyL (DULCOLAX) 5 mg EC tablet Take 5 mg by mouth once daily as needed for Constipation calcium carbonate 600 mg (1,500 mg) Tab tablet Take by mouth. cholecalciferol (VITAMIN D3) 400 unit tablet Take by mouth. latanoprost (XALATAN) 0.005 % ophthalmic solution 1 drop nightly levothyroxine (SYNTHROID, LEVOTHROID) 100 MCG tablet Take 1 tablet by mouth every morning. metoprolol succinate (TOPROL-XL) 25 MG XL tablet Take 12.5 mg by mouth once daily multivitamin tablet Take 1 tablet by mouth once daily multivitamin with minerals tablet Take by mouth once daily omega-3 fatty acids-fish oil 300-1,000 mg capsule Take by mouth omeprazole (PRILOSEC) 40 MG DR capsule Take 40 mg by mouth once daily polyethylene glycol (MIRALAX) packet Take 17 g by mouth once daily Mix in 4-8ounces of fluid prior to taking. potassium chloride (KLOR-CON) 20 MEQ ER tablet Take 1 tablet by mouth once daily sennosides-docusate (SENOKOT-S) 8.6-50 mg tablet Take by mouth as needed sertraline (ZOLOFT) 100 MG tablet Take 1 tablet by mouth once daily. simethicone (MYLICON) 80 MG chewable tablet Take 80 mg by mouth every 6 (six) hours as needed for Flatulence timolol hemihydrate (BETIMOL) 0.5 % ophthalmic solution Place 1 drop into both eyes 2 (two) times daily timoloL maleate (TIMOPTIC) 0.5 % ophthalmic solution dicyclomine (BENTYL) 10 mg capsule TAKE 1 CAPSULE (10 MG TOTAL) BY MOUTH 3 (THREE) TIMES DAILY BEFORE MEALS. (Patient not taking: Reported on 12/21/2020) 5 docusate (COLACE) 100 MG capsule Take 100 mg by mouth once daily as needed (Patient not taking: Reported on 03/31/2022) HYDROcodone-acetaminophen (NORCO) 5-325 mg tablet Take  1 tablet by mouth every 4 (four) hours as needed 25 tablet 0 letrozole (FEMARA) 2.5 mg tablet Take one tablet by mouth once daily. (Patient not taking: Reported on 03/31/2022) 90 tablet 3  No current Epic-ordered facility-administered medications on file.  Allergies: Allergies Allergen Reactions Diphenhydramine Hcl Other (See Comments) Ibuprofen Rash CAN TAKE NAPROSYN Loratadine Rash   Body mass index is 22.83 kg/m.  Review of Systems: A comprehensive 14 point ROS was performed, reviewed, and the pertinent orthopaedic findings are documented in the HPI.  There were no vitals filed for this visit.  General Physical Examination:  General/Constitutional: No apparent distress: well-nourished and well developed. Eyes: Pupils equal, round with synchronous movement. Lungs: Clear to auscultation HEENT: Normal Vascular: No edema, swelling or tenderness, except as noted in detailed exam. Cardiac: Heart rate and rhythm is regular. Integumentary: No impressive skin lesions present, except as noted in detailed exam. Neuro/Psych: Normal mood and affect, oriented to person, place and time.  On exam, sensation intact to the right upper extremity.swelling to fingers Foot has boot applied  Radiographs:  AP, lateral, and oblique x-rays of the right elbow were ordered and personally reviewed today. These show no evidence of fracture or significant degenerative change.  Assessment: ICD-10-CM 1. Closed Colles' fracture of right radius, initial encounter S52.531A 2. Right elbow pain M25.521 3. Closed nondisplaced fracture of cuboid of right foot, initial encounter S92.214A 4. Aortic atherosclerosis (CMS-HCC) I70.0  Plan:  The patient has clinical findings of: 1. Right comminuted distal radius fracture, closed, displaced. 2. Right cuboid fracture, closed, displaced.  We discussed the patient's x-ray findings. I explained she has a right comminuted distal radius fracture with plan for ORIF  tomorrow, 04/06/2022. I explained the surgery and postoperative course in detail. I wrote her  a new referral for nursing, physical therapy, and occupational therapy to work on gait and ambulation, assistive device, and safety in home ADLs and ADLs.  We will schedule the patient for ORIF tomorrow, 04/06/2022.  Surgical Risks:  The nature of the condition and the proposed procedure has been reviewed in detail with the patient. Surgical versus non-surgical options and prognosis for recovery have been reviewed and the inherent risks and benefits of each have been discussed including the risks of infection, bleeding, injury to nerves/blood vessels/tendons, incomplete relief of symptoms, persisting pain and/or stiffness, loss of function, complex regional pain syndrome, failure of the procedure, as appropriate.  Teeth: normal  Document Attestation: I, Velu Ramaswamy, have reviewed and updated documentation for [facility], MD, utilizing [employer] DAX.   Electronically signed by Lauris Poag, MD at 04/06/2022 3:53 PM EST   Reviewed  H+P. No changes noted.

## 2022-04-11 NOTE — Anesthesia Preprocedure Evaluation (Addendum)
Anesthesia Evaluation  Patient identified by MRN, date of birth, ID band Patient awake    Reviewed: Allergy & Precautions, H&P , NPO status , Patient's Chart, lab work & pertinent test results, reviewed documented beta blocker date and time   History of Anesthesia Complications Negative for: history of anesthetic complications  Airway Mallampati: I   Neck ROM: full    Dental  (+) Poor Dentition, Dental Advidsory Given   Pulmonary shortness of breath and with exertion, neg sleep apnea, pneumonia, resolved, neg COPD, Recent URI , Resolved   Pulmonary exam normal        Cardiovascular Exercise Tolerance: Good hypertension, On Medications (-) angina + CAD  (-) Past MI + dysrhythmias Atrial Fibrillation + Valvular Problems/Murmurs  Rhythm:regular Rate:Normal     Neuro/Psych  Headaches, neg Seizures PSYCHIATRIC DISORDERS Anxiety Depression    CVA (dizziness), Residual Symptoms    GI/Hepatic Neg liver ROS,GERD  Medicated,,  Endo/Other  neg diabetesHypothyroidism    Renal/GU negative Renal ROS  negative genitourinary   Musculoskeletal   Abdominal   Peds  Hematology  (+) Blood dyscrasia, anemia   Anesthesia Other Findings Past Medical History: No date: Abnormality of left ventricle of heart     Comment:  MILD-MODERATE THICKENING PER ECHO ON 01-18-17 No date: Anemia No date: Anxiety No date: Arthritis 10/2016: BRCA gene mutation negative     Comment:  NEGATIVE: [facility] 10/16/2016: Breast cancer ([place])     Comment:  T1c,N0;, GRADE I/III, 1.6cm. ER/PR pos  HER2 not over               expressed, Right Upper Outer No date: Celiac disease No date: Depression No date: Diastolic dysfunction No date: Dyspnea     Comment:  WITH EXERTION No date: GERD (gastroesophageal reflux disease)     Comment:  OCC No date: Glaucoma     Comment:  right eye No date: Headache     Comment:  H/O MIGRAINES AS TEENAGER No date: Heart  murmur     Comment:  PT WAS TOLD ONCE SHE HAD MVP AND THEN ANOTHER MD SAID               SHE DID NOT HAVE MVP No date: Hypothyroidism No date: Pneumonia     Comment:  YEARS AGO No date: Raynaud's disease No date: RLS (restless legs syndrome) Past Surgical History: No date: ABDOMINAL HYSTERECTOMY No date: APPENDECTOMY 03/05/2017: AXILLARY LYMPH NODE BIOPSY; Right     Comment:  Procedure: AXILLARY LYMPH NODE BIOPSY;  Surgeon:               Robert Bellow, MD;  Location: [facility] ORS;  Service:               General;  Laterality: Right; No date: CATARACT EXTRACTION; Bilateral No date: CESAREAN SECTION 05/19/2015: COLONOSCOPY WITH PROPOFOL; N/A     Comment:  Procedure: COLONOSCOPY WITH PROPOFOL;  Surgeon: Manya Silvas, MD;  Location: [facility] ENDOSCOPY;  Service:               Endoscopy;  Laterality: N/A; No date: EYE SURGERY No date: HAND SURGERY; Left 11/07/2016: MASTECTOMY; Right     Comment:  RESIDUAL INVASIVE MAMMARY CARCINOMA, SUBAREOLAR ANTERIOR              TO PREVIOUS  11/07/2016: SENTINEL NODE BIOPSY; Right     Comment:  Procedure: SENTINEL NODE BIOPSY;  Surgeon: Bary Castilla,  Forest Gleason, MD;  Location: [facility] ORS;  Service: General;                Laterality: Right; 11/07/2016: SIMPLE MASTECTOMY WITH AXILLARY SENTINEL NODE BIOPSY; Right     Comment:  6 mm ER/PR 100%; Her 2 neu not overexpressed, T1b, N0.                Surgeon: Robert Bellow, MD;  Location: [facility] ORS;                Service: General;  Laterality: Right; AGE 76: TONSILLECTOMY 01/12/06: UPPER GI ENDOSCOPY     Comment:  hiatus hernia   Reproductive/Obstetrics negative OB ROS                             Anesthesia Physical Anesthesia Plan  ASA: 3  Anesthesia Plan: General   Post-op Pain Management: Regional block   Induction: Intravenous  PONV Risk Score and Plan: 3 and Ondansetron, Dexamethasone and Treatment may vary due to age or medical  condition  Airway Management Planned: LMA and Oral ETT  Additional Equipment:   Intra-op Plan:   Post-operative Plan: Extubation in OR  Informed Consent: I have reviewed the patients History and Physical, chart, labs and discussed the procedure including the risks, benefits and alternatives for the proposed anesthesia with the patient or authorized representative who has indicated his/her understanding and acceptance.     Dental Advisory Given  Plan Discussed with: CRNA  Anesthesia Plan Comments:         Anesthesia Quick Evaluation

## 2022-04-11 NOTE — Transfer of Care (Signed)
Immediate Anesthesia Transfer of Care Note  Patient: Sandra [name]  Procedure(s) Performed: OPEN REDUCTION INTERNAL FIXATION (ORIF) DISTAL RADIUS FRACTURE (Right: Wrist)  Patient Location: PACU  Anesthesia Type: General  Level of Consciousness: awake, alert  and patient cooperative  Airway and Oxygen Therapy: Patient Spontanous Breathing and Patient connected to supplemental oxygen  Post-op Assessment: Post-op Vital signs reviewed, Patient's Cardiovascular Status Stable, Respiratory Function Stable, Patent Airway and No signs of Nausea or vomiting  Post-op Vital Signs: Reviewed and stable  Complications: No notable events documented.

## 2022-04-11 NOTE — Progress Notes (Signed)
Assisted Dr. Rosey Bath with right, interscalene , ultrasound guided block. Side rails up, monitors on throughout procedure. See vital signs in flow sheet. Tolerated Procedure well.

## 2022-04-11 NOTE — Anesthesia Procedure Notes (Signed)
Anesthesia Regional Block: Supraclavicular block   Pre-Anesthetic Checklist: , timeout performed,  Correct Patient, Correct Site, Correct Laterality,  Correct Procedure, Correct Position, site marked,  Risks and benefits discussed,  Surgical consent,  Pre-op evaluation,  At surgeon's request and post-op pain management  Laterality: Right and Upper  Prep: chloraprep       Needles:  Injection technique: Single-shot  Needle Type: Stimiplex     Needle Length: 9cm  Needle Gauge: 22     Additional Needles:   Procedures:,,,, ultrasound used (permanent image in chart),,    Narrative:  Start time: 04/11/2022 1:55 PM End time: 04/11/2022 1:48 PM Injection made incrementally with aspirations every 5 mL.  Performed by: Personally  Anesthesiologist: Martha Clan, MD  Additional Notes: Functioning IV was confirmed and monitors were applied.  A 36m 22ga Stimuplex needle was used. Sterile prep and drape,hand hygiene and sterile gloves were used.  Negative aspiration and negative test dose prior to incremental administration of local anesthetic. The patient tolerated the procedure well.

## 2022-04-11 NOTE — Anesthesia Postprocedure Evaluation (Signed)
Anesthesia Post Note  Patient: Sandra [name]  Procedure(s) Performed: OPEN REDUCTION INTERNAL FIXATION (ORIF) DISTAL RADIUS FRACTURE (Right: Wrist)  Patient location during evaluation: PACU Anesthesia Type: General and Regional Level of consciousness: awake and alert Pain management: pain level controlled Vital Signs Assessment: post-procedure vital signs reviewed and stable Respiratory status: spontaneous breathing, nonlabored ventilation, respiratory function stable and patient connected to nasal cannula oxygen Cardiovascular status: blood pressure returned to baseline and stable Postop Assessment: no apparent nausea or vomiting Anesthetic complications: no   No notable events documented.   Last Vitals:  Vitals:   04/11/22 1400 04/11/22 1415  BP: 126/72 124/62  Pulse: 60 61  Resp: 15 12  Temp:  (!) 36.3 C  SpO2: 96% 93%    Last Pain:  Vitals:   04/11/22 1415  TempSrc:   PainSc: 5                  Martha Clan

## 2022-04-12 ENCOUNTER — Encounter: Payer: Self-pay | Admitting: Orthopedic Surgery

## 2022-04-12 LAB — CULTURE, BLOOD (SINGLE)
Culture: NO GROWTH
Special Requests: ADEQUATE

## 2022-04-14 ENCOUNTER — Ambulatory Visit: Payer: Medicare Other | Admitting: Cardiology

## 2022-04-19 ENCOUNTER — Ambulatory Visit: Payer: Medicare Other | Admitting: Cardiology

## 2022-04-21 ENCOUNTER — Emergency Department: Payer: Medicare Other

## 2022-04-21 ENCOUNTER — Observation Stay
Admission: EM | Admit: 2022-04-21 | Discharge: 2022-04-23 | Disposition: A | Discharge status: Home / Self Care | Payer: Medicare Other | Attending: Student | Admitting: Student

## 2022-04-21 ENCOUNTER — Other Ambulatory Visit: Payer: Self-pay

## 2022-04-21 DIAGNOSIS — I2489 Other forms of acute ischemic heart disease: Secondary | ICD-10-CM | POA: Diagnosis not present

## 2022-04-21 DIAGNOSIS — Z79899 Other long term (current) drug therapy: Secondary | ICD-10-CM | POA: Insufficient documentation

## 2022-04-21 DIAGNOSIS — I4891 Unspecified atrial fibrillation: Secondary | ICD-10-CM | POA: Diagnosis not present

## 2022-04-21 DIAGNOSIS — Z7982 Long term (current) use of aspirin: Secondary | ICD-10-CM | POA: Insufficient documentation

## 2022-04-21 DIAGNOSIS — I110 Hypertensive heart disease with heart failure: Secondary | ICD-10-CM | POA: Diagnosis not present

## 2022-04-21 DIAGNOSIS — Z8673 Personal history of transient ischemic attack (TIA), and cerebral infarction without residual deficits: Secondary | ICD-10-CM | POA: Diagnosis not present

## 2022-04-21 DIAGNOSIS — I2510 Atherosclerotic heart disease of native coronary artery without angina pectoris: Secondary | ICD-10-CM | POA: Diagnosis not present

## 2022-04-21 DIAGNOSIS — I5033 Acute on chronic diastolic (congestive) heart failure: Secondary | ICD-10-CM | POA: Diagnosis present

## 2022-04-21 DIAGNOSIS — I5032 Chronic diastolic (congestive) heart failure: Secondary | ICD-10-CM | POA: Insufficient documentation

## 2022-04-21 DIAGNOSIS — I480 Paroxysmal atrial fibrillation: Secondary | ICD-10-CM | POA: Diagnosis not present

## 2022-04-21 DIAGNOSIS — I10 Essential (primary) hypertension: Secondary | ICD-10-CM | POA: Diagnosis present

## 2022-04-21 DIAGNOSIS — E039 Hypothyroidism, unspecified: Secondary | ICD-10-CM | POA: Insufficient documentation

## 2022-04-21 DIAGNOSIS — R000 Tachycardia, unspecified: Secondary | ICD-10-CM | POA: Diagnosis present

## 2022-04-21 LAB — MAGNESIUM: Magnesium: 2.2 mg/dL (ref 1.7–2.4)

## 2022-04-21 LAB — TROPONIN I (HIGH SENSITIVITY)
Troponin I (High Sensitivity): 53 ng/L — ABNORMAL HIGH
Troponin I (High Sensitivity): 44 ng/L — ABNORMAL HIGH

## 2022-04-21 LAB — CBC
HCT: 38.6 % (ref 36.0–46.0)
Hemoglobin: 12 g/dL (ref 12.0–15.0)
MCH: 26.9 pg (ref 26.0–34.0)
MCHC: 31.1 g/dL (ref 30.0–36.0)
MCV: 86.5 fL (ref 80.0–100.0)
Platelets: 433 10*3/uL — ABNORMAL HIGH (ref 150–400)
RBC: 4.46 MIL/uL (ref 3.87–5.11)
RDW: 15.7 % — ABNORMAL HIGH (ref 11.5–15.5)
WBC: 8.1 10*3/uL (ref 4.0–10.5)
nRBC: 0 % (ref 0.0–0.2)

## 2022-04-21 LAB — BRAIN NATRIURETIC PEPTIDE: B Natriuretic Peptide: 691.3 pg/mL — ABNORMAL HIGH (ref 0.0–100.0)

## 2022-04-21 LAB — T4, FREE: Free T4: 1.16 ng/dL — ABNORMAL HIGH (ref 0.61–1.12)

## 2022-04-21 LAB — BASIC METABOLIC PANEL WITH GFR
Anion gap: 9 (ref 5–15)
BUN: 20 mg/dL (ref 8–23)
CO2: 25 mmol/L (ref 22–32)
Calcium: 9.3 mg/dL (ref 8.9–10.3)
Chloride: 106 mmol/L (ref 98–111)
Creatinine, Ser: 0.73 mg/dL (ref 0.44–1.00)
GFR, Estimated: >60 mL/min
Glucose, Bld: 88 mg/dL (ref 70–99)
Potassium: 3.9 mmol/L (ref 3.5–5.1)
Sodium: 140 mmol/L (ref 135–145)

## 2022-04-21 LAB — URINALYSIS, ROUTINE W REFLEX MICROSCOPIC
Bilirubin Urine: NEGATIVE
Glucose, UA: NEGATIVE mg/dL
Hgb urine dipstick: NEGATIVE
Ketones, ur: NEGATIVE mg/dL
Leukocytes,Ua: NEGATIVE
Nitrite: NEGATIVE
Protein, ur: NEGATIVE mg/dL
Specific Gravity, Urine: >1.046 — ABNORMAL HIGH (ref 1.005–1.030)
pH: 6 (ref 5.0–8.0)

## 2022-04-21 LAB — TSH: TSH: 3.815 u[IU]/mL (ref 0.350–4.500)

## 2022-04-21 LAB — D-DIMER, QUANTITATIVE: D-Dimer, Quant: 1.68 ug/mL-FEU — ABNORMAL HIGH (ref 0.00–0.50)

## 2022-04-21 MED ORDER — DILTIAZEM HCL-DEXTROSE 125-5 MG/125ML-% IV SOLN (PREMIX)
5.0000 mg/h | INTRAVENOUS | Status: DC
  Administered 2022-04-21: 7.5 mg/h via INTRAVENOUS
  Administered 2022-04-21: 5 mg/h via INTRAVENOUS
  Filled 2022-04-21 (×2): qty 125

## 2022-04-21 MED ORDER — ONDANSETRON HCL 4 MG PO TABS: 4.0000 mg | ORAL_TABLET | Freq: Four times a day (QID) | ORAL | Status: DC | PRN

## 2022-04-21 MED ORDER — DILTIAZEM HCL 25 MG/5ML IV SOLN
10.0000 mg | Freq: Once | INTRAVENOUS | Status: AC
  Administered 2022-04-21: 10 mg via INTRAVENOUS
  Filled 2022-04-21: qty 5

## 2022-04-21 MED ORDER — METOPROLOL SUCCINATE ER 25 MG PO TB24
12.5000 mg | ORAL_TABLET | Freq: Every day | ORAL | Status: DC
  Administered 2022-04-22 – 2022-04-23 (×2): 12.5 mg via ORAL
  Filled 2022-04-21: qty 0.5
  Filled 2022-04-21: qty 1

## 2022-04-21 MED ORDER — ADULT MULTIVITAMIN W/MINERALS CH
1.0000 | ORAL_TABLET | Freq: Every day | ORAL | Status: DC
  Administered 2022-04-21 – 2022-04-23 (×3): 1 via ORAL
  Filled 2022-04-21 (×3): qty 1

## 2022-04-21 MED ORDER — PANTOPRAZOLE SODIUM 40 MG PO TBEC
40.0000 mg | DELAYED_RELEASE_TABLET | Freq: Every day | ORAL | Status: DC
  Administered 2022-04-22 – 2022-04-23 (×2): 40 mg via ORAL
  Filled 2022-04-21 (×2): qty 1

## 2022-04-21 MED ORDER — ACETAMINOPHEN 650 MG RE SUPP: 650.0000 mg | Freq: Four times a day (QID) | RECTAL | Status: DC | PRN

## 2022-04-21 MED ORDER — ALPRAZOLAM 0.25 MG PO TABS
0.2500 mg | ORAL_TABLET | Freq: Every evening | ORAL | Status: DC | PRN
  Administered 2022-04-21 – 2022-04-22 (×2): 0.25 mg via ORAL
  Filled 2022-04-21 (×2): qty 1

## 2022-04-21 MED ORDER — LACTATED RINGERS IV SOLN: INTRAVENOUS | Status: AC

## 2022-04-21 MED ORDER — ONDANSETRON HCL 4 MG/2ML IJ SOLN: 4.0000 mg | Freq: Four times a day (QID) | INTRAMUSCULAR | Status: DC | PRN

## 2022-04-21 MED ORDER — IOHEXOL 350 MG/ML SOLN
75.0000 mL | Freq: Once | INTRAVENOUS | Status: AC | PRN
  Administered 2022-04-21: 75 mL via INTRAVENOUS

## 2022-04-21 MED ORDER — ACETAMINOPHEN 325 MG PO TABS: 650.0000 mg | ORAL_TABLET | Freq: Four times a day (QID) | ORAL | Status: DC | PRN

## 2022-04-21 MED ORDER — ALPRAZOLAM 0.5 MG PO TABS: 0.5000 mg | ORAL_TABLET | Freq: Every evening | ORAL | Status: DC | PRN

## 2022-04-21 MED ORDER — HYDROCODONE-ACETAMINOPHEN 5-325 MG PO TABS: 1.0000 | ORAL_TABLET | Freq: Four times a day (QID) | ORAL | Status: DC | PRN

## 2022-04-21 MED ORDER — LATANOPROST 0.005 % OP SOLN
1.0000 [drp] | Freq: Every day | OPHTHALMIC | Status: DC
  Administered 2022-04-22: 1 [drp] via OPHTHALMIC
  Filled 2022-04-21: qty 2.5

## 2022-04-21 MED ORDER — LEVOTHYROXINE SODIUM 100 MCG PO TABS
100.0000 ug | ORAL_TABLET | Freq: Every day | ORAL | Status: DC
  Administered 2022-04-22 – 2022-04-23 (×2): 100 ug via ORAL
  Filled 2022-04-21 (×2): qty 1

## 2022-04-21 MED ORDER — SODIUM CHLORIDE 0.9 % IV BOLUS
500.0000 mL | Freq: Once | INTRAVENOUS | Status: AC
  Administered 2022-04-21: 500 mL via INTRAVENOUS

## 2022-04-21 MED ORDER — SODIUM CHLORIDE 0.9% FLUSH
3.0000 mL | Freq: Two times a day (BID) | INTRAVENOUS | Status: DC
  Administered 2022-04-22 – 2022-04-23 (×2): 3 mL via INTRAVENOUS

## 2022-04-21 MED ORDER — SERTRALINE HCL 50 MG PO TABS
100.0000 mg | ORAL_TABLET | Freq: Every day | ORAL | Status: DC
  Administered 2022-04-22 – 2022-04-23 (×2): 100 mg via ORAL
  Filled 2022-04-21 (×2): qty 2

## 2022-04-21 MED ORDER — ATORVASTATIN CALCIUM 20 MG PO TABS
40.0000 mg | ORAL_TABLET | Freq: Every day | ORAL | Status: DC
  Administered 2022-04-21 – 2022-04-23 (×3): 40 mg via ORAL
  Filled 2022-04-21 (×3): qty 2

## 2022-04-21 MED ORDER — ASPIRIN 81 MG PO TBEC
81.0000 mg | DELAYED_RELEASE_TABLET | Freq: Every day | ORAL | Status: DC
  Administered 2022-04-22 – 2022-04-23 (×2): 81 mg via ORAL
  Filled 2022-04-21 (×2): qty 1

## 2022-04-21 MED ORDER — ENOXAPARIN SODIUM 40 MG/0.4ML IJ SOSY
40.0000 mg | PREFILLED_SYRINGE | INTRAMUSCULAR | Status: DC
  Administered 2022-04-21 – 2022-04-22 (×2): 40 mg via SUBCUTANEOUS
  Filled 2022-04-21 (×2): qty 0.4

## 2022-04-21 MED ORDER — TIMOLOL MALEATE 0.5 % OP SOLN
1.0000 [drp] | Freq: Two times a day (BID) | OPHTHALMIC | Status: DC
  Administered 2022-04-22 – 2022-04-23 (×3): 1 [drp] via OPHTHALMIC
  Filled 2022-04-21 (×2): qty 5

## 2022-04-21 MED ORDER — BISACODYL 5 MG PO TBEC: 5.0000 mg | DELAYED_RELEASE_TABLET | Freq: Every day | ORAL | Status: DC | PRN

## 2022-04-21 MED ORDER — POLYSACCHARIDE IRON COMPLEX 150 MG PO CAPS
150.0000 mg | ORAL_CAPSULE | Freq: Every day | ORAL | Status: DC
  Administered 2022-04-22 – 2022-04-23 (×2): 150 mg via ORAL
  Filled 2022-04-21 (×2): qty 1

## 2022-04-21 MED ORDER — POLYETHYLENE GLYCOL 3350 17 G PO PACK: 17.0000 g | PACK | Freq: Every day | ORAL | Status: DC | PRN

## 2022-04-21 NOTE — Assessment & Plan Note (Signed)
Patient is euvolemic on examination although she does have mild Rales.  BNP is comparable to what it was 2 weeks ago.  However specific gravity of urine significantly elevated.  Will gently rehydrate with 500 cc over the next 6 hours.

## 2022-04-21 NOTE — ED Provider Notes (Addendum)
[facility] Provider Note    Event Date/Time   First MD Initiated Contact with Patient 04/21/22 1421     (approximate)   History   Atrial Fibrillation   HPI  Sandra [name] is a 76 y.o. female with  Small A-fib not on anticoagulation due to prior intracranial hemorrhage with Watchman device, who takes metoprolol at home who comes in with concerns for elevated heart rate.  Patient reports having an elevated heart rate over the past 2 days.  She states that she is sometimes does not know when she is in the A-fib but if it speeds up she has symptoms.  Patient is currently has a heart rate of 140 and reports not feeling very symptomatic at this time.  She denies ever having a cardioversion previously and she is not on anticoagulation.  Denies any significant more shortness of breath but did have a recent hospital admission from 11/10 until 11/12 secondary to UTI with pyelonephritis as well as then underwent surgery on 11/14.  She reports that she does go in and out of A-fib very frequently and that typically she will come back out of it after a few hours and she can sometimes take an additional dose of her metoprolol but today she did not.  Physical Exam   Triage Vital Signs: ED Triage Vitals  Enc Vitals Group     BP --      Pulse Rate 04/21/22 1422 (!) 132     Resp 04/21/22 1417 16     Temp 04/21/22 1422 97.9 F (36.6 C)     Temp Source 04/21/22 1422 Oral     SpO2 04/21/22 1417 96 %     Weight --      Height --      Head Circumference --      Peak Flow --      Pain Score 04/21/22 1418 0     Pain Loc --      Pain Edu? --      Excl. in [place]? --     Most recent vital signs: Vitals:   04/21/22 1417 04/21/22 1422  Pulse:  (!) 132  Resp: 16   Temp:  97.9 F (36.6 C)  SpO2: 96% 95%     General: Awake, no distress.  CV:  Good peripheral perfusion.  Irregular, tachycardic Resp:  Normal effort.  Abd:  No distention. Soft and non tender  Other:  No  swelling the legs.  No calf tenderness CN appear intact  equal strength in legs/ R grip slightly weaker but recent wrist surgery.  R wrist in splint.    ED Results / Procedures / Treatments   Labs (all labs ordered are listed, but only abnormal results are displayed) Labs Reviewed  CBC - Abnormal; Notable for the following components:      Result Value   RDW 15.7 (*)    Platelets 433 (*)    All other components within normal limits  T4, FREE - Abnormal; Notable for the following components:   Free T4 1.16 (*)    All other components within normal limits  D-DIMER, QUANTITATIVE - Abnormal; Notable for the following components:   D-Dimer, Quant 1.68 (*)    All other components within normal limits  BRAIN NATRIURETIC PEPTIDE - Abnormal; Notable for the following components:   B Natriuretic Peptide 691.3 (*)    All other components within normal limits  BASIC METABOLIC PANEL  MAGNESIUM  TSH  URINALYSIS, ROUTINE  W REFLEX MICROSCOPIC  TROPONIN I (HIGH SENSITIVITY)     EKG  My interpretation of EKG:  A-fib rate of 132 without any ST elevation, T wave inversions in 1 to V4 through V6 little bit of depression noted.  Normal intervals  RADIOLOGY I have reviewed the xray personally and agree with radiology read no obvious effusion or pneumothorax   PROCEDURES:  Critical Care performed: Yes, see critical care procedure note(s)  .1-3 Lead EKG Interpretation  Performed by: Vanessa [place], MD Authorized by: Vanessa [place], MD     Interpretation: abnormal     ECG rate:  130   ECG rate assessment: tachycardic     Rhythm: atrial fibrillation     Ectopy: none     Conduction: normal      MEDICATIONS ORDERED IN ED: Medications  diltiazem (CARDIZEM) injection 10 mg (has no administration in time range)     IMPRESSION / MDM / ASSESSMENT AND PLAN / ED COURSE  I reviewed the triage vital signs and the nursing notes.   Patient's presentation is most consistent with acute  presentation with potential threat to life or bodily function.   Patient comes in with atrial fibrillation with RVR patient reports being symptomatic when she is in RVR but not symptomatic when in A-fib.  She does have a Watchman but typically flips in and out of it.  We discussed cardioversion versus rate control and the risk of these.  Patient did not want to do cardioversion.  We will trial some diltiazem and give patient some fluids that she feels dehydrated we will get some lab work to evaluate for [employer] abnormalities, dehydration, PE, ACS  Patient appears pretty comfortable at this time does report a little palpitation but denies any significant pain.  CBC reassuring  Patient given 10 of IV diltiazem.  Patient will be handed off to oncoming team  The patient is on the cardiac monitor to evaluate for evidence of arrhythmia and/or significant heart rate changes.      FINAL CLINICAL IMPRESSION(S) / ED DIAGNOSES   Final diagnoses:  Atrial fibrillation with rapid ventricular response ([place])     Rx / DC Orders   ED Discharge Orders     None        Note:  This document was prepared using Dragon voice recognition software and may include unintentional dictation errors.   Vanessa Frank, MD 04/21/22 1447    Vanessa , MD 04/21/22 [phone]

## 2022-04-21 NOTE — ED Notes (Signed)
Purewick applied and functional.

## 2022-04-21 NOTE — Assessment & Plan Note (Signed)
-   Holding home amlodipine in the setting of diltiazem infusion

## 2022-04-21 NOTE — Assessment & Plan Note (Signed)
Patient presenting with A-fib with RVR previously well-controlled with metoprolol 12.5 mg daily.  She has been responding well to diltiazem drip; I suspect on discharge, she will either require higher dose of metoprolol or a combination of metoprolol and diltiazem.  If rate control remains difficult to achieve, could consider cardiology consultation for antiarrhythmics.  - Telemetry monitoring - Continue diltiazem infusion with goal heart rate below 100 - Once that is stable rate, could transition to p.o. - Restart home metoprolol tomorrow morning as patient already took her dose today

## 2022-04-21 NOTE — H&P (Signed)
History and Physical    Patient: Sandra [name] DOB: Jan 23, 1946 DOA: 04/21/2022 DOS: the patient was seen and examined on 04/21/2022 PCP: Waylan Rocher, MD  Patient coming from: Home  Chief Complaint:  Chief Complaint  Patient presents with   Atrial Fibrillation   HPI: Sandra [name] is a 76 y.o. female with medical history significant of HFpEF with a EF of 60-65%, paroxysmal atrial fibrillation s/p watchman's, hemorrhagic CVA, nonobstructive CAD, celiac disease, hypothyroidism, Raynaud's, prediabetes who presents to the ED with elevated heart rate.  Mrs. Binette states that yesterday morning, she noticed that she was feeling lightheaded and was experiencing generalized weakness.  Symptoms are particularly worse when going from a sitting to a standing position. Her symptoms came and went throughout the day persistently, however she did not want to go to the ER at that time.  Today, she went to her local pharmacy and asked them to check her blood pressure and heart rate.  They told her her blood pressure was very low and her heart rate was very high and EMS was called. Mrs. Moro denies any other acute complaints at this time including chest pain, shortness of breath, lower extremity edema.  She has been eating and drinking normally.  On EMS arrival, EKG was consistent with atrial fibrillation with heart rate ranging between 101 152.  Her blood pressure was 147/82.  ED course: On arrival to the ED, patient was hypertensive at 149/81 with heart rate of 140.  She was saturating at 97% on room air.  BNP elevated at 691, similar to prior BMP obtained approximately 2 weeks ago.  Troponin elevated at 53.  D-dimer elevated; CTA was obtained and negative for PE.  Chest x-ray with some vascular congestion but not seen on CTA.  Patient was started on diltiazem infusion and [facility] contacted for admission.  Review of Systems: As mentioned in the history of present illness. All other  systems reviewed and are negative.  Past Medical History:  Diagnosis Date   (HFpEF) heart failure with preserved ejection fraction ([place])    a. 08/2019 Echo: EF 60-65%, no rwma, mild LVH, Gr2 DD, nl RV fxn. Nl pASP. Mildly dil LA. Mild to mod MR.   Anemia    Anxiety    Arthritis    BRCA gene mutation negative 10/2016   NEGATIVE: [facility]   Breast cancer ([place]) 10/16/2016   T1c,N0;, GRADE I/III, 1.6cm. ER/PR pos  HER2 not over expressed, Right Upper Outer   Celiac disease    Celiac syndrome    Cerebellar hemorrhage, acute ([place]) 07/17/2020   Depression    Dyspnea    WITH EXERTION   Fatty liver    GERD (gastroesophageal reflux disease)    OCC   Glaucoma    right eye   Headache    H/O MIGRAINES AS TEENAGER.   Heart murmur    a. 08/2019 Echo: mild to mod MR.   Hypertension    Hypothyroidism    Non-obstructive CAD (coronary artery disease)    a. 01/2017 MV: Hypertensive response. No ischemia/infarct. EF >65%; b. 09/2019 Cor CTA: Ca2+ = 9.29 (36th percentile). LAD calcified plaque (0-24%), otw nl. Multipel bilat pulm nodules up to 93m.   Osteoporosis    PAF (paroxysmal atrial fibrillation) ([place]    a. 02/2020 Zio: predominantly RSR, 65 (50-105), rare PACs/PVCs, 8 beats NSVT, multiple episodes of PAF lasting up to 1hr 249ms. Avg AF rate 130 (93-170). AF burden <1%. Triggered events = RSR, PACs, and  PAF; b. CHA2DS2VASc = 6.   Pneumonia    YEARS AGO   Pre-diabetes    Presence of Watchman left atrial appendage closure device 01/2021   Pulmonary nodules    a. 09/2019 Cor CTA: incidental finding of multiple bilat pulm nodules; b. 12/2019 High Res CT: stable, scattered solid pulm nodules.   Raynaud's disease    RLS (restless legs syndrome)    Stroke ([place]) 06/2020   mild balance issues   Wears hearing aid in both ears    Past Surgical History:  Procedure Laterality Date   ABDOMINAL HYSTERECTOMY     APPENDECTOMY     AXILLARY LYMPH NODE BIOPSY Right 03/05/2017   Procedure: AXILLARY LYMPH  NODE BIOPSY;  Surgeon: Robert Bellow, MD;  Location: [facility] ORS;  Service: General;  Laterality: Right;   CATARACT EXTRACTION Bilateral    CESAREAN SECTION     COLONOSCOPY WITH PROPOFOL N/A 05/19/2015   Procedure: COLONOSCOPY WITH PROPOFOL;  Surgeon: Manya Silvas, MD;  Location: [facility] ENDOSCOPY;  Service: Endoscopy;  Laterality: N/A;   COLONOSCOPY WITH PROPOFOL N/A 08/01/2019   Procedure: COLONOSCOPY WITH PROPOFOL;  Surgeon: Lucilla Lame, MD;  Location: [facility] ENDOSCOPY;  Service: Endoscopy;  Laterality: N/A;   EYE SURGERY     HAND SURGERY Left    LEFT ATRIAL APPENDAGE OCCLUSION N/A 01/27/2021   Procedure: LEFT ATRIAL APPENDAGE OCCLUSION;  Surgeon: Vickie Epley, MD;  Location: [place] [facility];  Service: Cardiovascular;  Laterality: N/A;   MASTECTOMY Right 11/07/2016   RESIDUAL INVASIVE MAMMARY CARCINOMA, SUBAREOLAR ANTERIOR TO PREVIOUS    OPEN REDUCTION INTERNAL FIXATION (ORIF) DISTAL RADIAL FRACTURE Right 04/11/2022   Procedure: OPEN REDUCTION INTERNAL FIXATION (ORIF) DISTAL RADIUS FRACTURE;  Surgeon: Hessie Knows, MD;  Location: [place];  Service: Orthopedics;  Laterality: Right;   SENTINEL NODE BIOPSY Right 11/07/2016   Procedure: SENTINEL NODE BIOPSY;  Surgeon: Robert Bellow, MD;  Location: [facility] ORS;  Service: General;  Laterality: Right;   SIMPLE MASTECTOMY WITH AXILLARY SENTINEL NODE BIOPSY Right 11/07/2016   6 mm ER/PR 100%; Her 2 neu not overexpressed, T1b, N0.  Surgeon: Robert Bellow, MD;  Location: [facility] ORS;  Service: General;  Laterality: Right;   TEE WITHOUT CARDIOVERSION N/A 01/27/2021   Procedure: TRANSESOPHAGEAL ECHOCARDIOGRAM (TEE);  Surgeon: Vickie Epley, MD;  Location: [place] [facility];  Service: Cardiovascular;  Laterality: N/A;   TEE WITHOUT CARDIOVERSION N/A 03/10/2021   Procedure: TRANSESOPHAGEAL ECHOCARDIOGRAM (TEE);  Surgeon: Elouise Munroe, MD;  Location: [place];  Service: Cardiology;  Laterality: N/A;   TONSILLECTOMY  AGE  14   UPPER GI ENDOSCOPY  01/12/06   hiatus hernia   Social History:  reports that she has never smoked. She has never used smokeless tobacco. She reports that she does not currently use alcohol. She reports that she does not use drugs.  Allergies  Allergen Reactions   Tape Rash    Family History  Problem Relation Age of Onset   Cancer Mother    Anemia Mother    Other Mother        lymphosarcoma   Alcohol abuse Father    Prostate cancer Father    Breast cancer Sister        88; 1/2 sister, shared mother.   Cancer Sister        bile duct   Stroke Maternal Grandmother    Breast cancer Maternal Grandmother 68   Heart attack Maternal Grandfather    CAD Paternal Grandfather  Prior to Admission medications   Medication Sig Start Date End Date Taking? Authorizing Provider  ALPRAZolam Duanne Moron) 0.5 MG tablet Take 1 tablet (0.5 mg total) by mouth at bedtime as needed. for sleep Patient taking differently: Take 0.25 mg by mouth at bedtime as needed for sleep (Restless Legs). for sleep 03/24/22  Yes Birdie Sons, MD  amLODipine (NORVASC) 10 MG tablet Take 0.5 tablets (5 mg total) by mouth daily. Home med 04/09/22  Yes Enzo Bi, MD  aspirin EC 81 MG tablet Take 1 tablet (81 mg total) by mouth daily. Swallow whole. 07/27/21  Yes Vickie Epley, MD  atorvastatin (LIPITOR) 40 MG tablet TAKE 1 TABLET BY MOUTH DAILY Patient taking differently: Take 40 mg by mouth daily. 09/02/21  Yes Dunn, Areta Haber, PA-C  Calcium Carb-Cholecalciferol (CALCIUM 600 + D PO) Take 1 tablet by mouth daily.   Yes [provider]  Cholecalciferol (VITAMIN D3) 125 MCG (5000 UT) TABS Take 5,000 Units by mouth daily.   Yes [provider]  iron polysaccharides (NIFEREX) 150 MG capsule Take 1 capsule (150 mg total) by mouth daily. Or can take any over-the-counter iron supplement. 04/09/22  Yes Enzo Bi, MD  latanoprost (XALATAN) 0.005 % ophthalmic solution Place 1 drop into both eyes at bedtime.   Yes  [provider]  levothyroxine (SYNTHROID) 100 MCG tablet TAKE 1 TABLET BY MOUTH 30  MINUTES BEFORE BREAKFAST Patient taking differently: Take 100 mcg by mouth daily before breakfast. TAKE 1 TABLET BY MOUTH 30  MINUTES BEFORE BREAKFAST 02/02/22  Yes Jerrol Banana., MD  metoprolol succinate (TOPROL-XL) 25 MG 24 hr tablet Take 0.5 tablets (12.5 mg total) by mouth daily. May take additional 12.5 mg for A Fib.  Home med. 04/09/22  Yes Enzo Bi, MD  Multiple Vitamin (MULTIVITAMIN WITH MINERALS) TABS tablet Take 1 tablet by mouth daily at 3 pm.   Yes [provider]  omeprazole (PRILOSEC) 40 MG capsule TAKE 1 CAPSULE BY MOUTH DAILY Patient taking differently: Take 40 mg by mouth daily. 02/02/22  Yes Jerrol Banana., MD  potassium chloride SA (KLOR-CON M) 20 MEQ tablet Take 1 tablet (20 mEq total) by mouth daily for 7 days. 04/09/22 04/21/22 Yes Enzo Bi, MD  sertraline (ZOLOFT) 100 MG tablet TAKE 1 TABLET BY MOUTH DAILY Patient taking differently: Take 100 mg by mouth daily. 12/05/21  Yes Jerrol Banana., MD  timolol (BETIMOL) 0.5 % ophthalmic solution Place 1 drop into both eyes 2 (two) times daily.   Yes [provider]  acetaminophen (TYLENOL) 325 MG tablet Take 2 tablets (650 mg total) by mouth every 6 (six) hours as needed for mild pain (or Fever >/= 101). 04/03/22   Rai, Vernelle Emerald, MD  bisacodyl 5 MG EC tablet Take 5 mg by mouth daily as needed for moderate constipation.    [provider]  docusate sodium (COLACE) 100 MG capsule Take 100 mg by mouth daily as needed for mild constipation.    [provider]  HYDROcodone-acetaminophen (NORCO/VICODIN) 5-325 MG tablet Take by mouth. 04/06/22   [provider]  hydroxypropyl methylcellulose / hypromellose (ISOPTO TEARS / GONIOVISC) 2.5 % ophthalmic solution Place 1 drop into both eyes 4 (four) times daily as needed for dry eyes.    [provider]  polyethylene glycol  (MIRALAX / GLYCOLAX) 17 g packet Take 17 g by mouth daily as needed for moderate constipation.    [provider]  potassium chloride (KLOR-CON) 20  MEQ packet Take 20 mEq by mouth daily. 04/18/22   [provider]    Physical Exam: Vitals:   04/21/22 1845 04/21/22 1900 04/21/22 1930 04/21/22 2007  BP:  130/84 139/82   Pulse: 62 (!) 113 (!) 114   Resp: _0 Temp:    98 F (36.7 C)  TempSrc:    Oral  SpO2: 95% 97% 95%    Physical Exam Vitals and nursing note reviewed.  Constitutional:      General: She is not in acute distress.    Appearance: She is normal weight. She is not toxic-appearing.  HENT:     Head: Normocephalic and atraumatic.     Mouth/Throat:     Mouth: Mucous membranes are moist.     Pharynx: Oropharynx is clear.  Eyes:     Conjunctiva/sclera: Conjunctivae normal.     Pupils: Pupils are equal, round, and reactive to light.  Cardiovascular:     Rate and Rhythm: Tachycardia present. Rhythm irregular.     Heart sounds: No murmur heard. Pulmonary:     Effort: Pulmonary effort is normal. No respiratory distress.     Breath sounds: No stridor. Rales (Bibasilar fine crackles) present. No wheezing or rhonchi.  Abdominal:     General: Bowel sounds are normal. There is no distension.     Palpations: Abdomen is soft.     Tenderness: There is no abdominal tenderness. There is no guarding.  Musculoskeletal:     Right lower leg: No edema.     Left lower leg: No edema.  Skin:    General: Skin is warm and dry.  Neurological:     General: No focal deficit present.     Mental Status: She is alert and oriented to person, place, and time. Mental status is at baseline.  Psychiatric:        Mood and Affect: Mood normal.        Behavior: Behavior normal.        Thought Content: Thought content normal.        Judgment: Judgment normal.    Data Reviewed: CBC with elevated platelets at 433 but no anemia or leukocytosis.  BMP with no abnormalities.   Magnesium and TSH within normal limits.  Free T4 slightly above normal range at 1.16.  D-dimer elevated at 1.68.  BNP elevated at 691, previously 625.  Troponin initially elevated at 53 with downtrend to 44.  Urinalysis with high specific gravity.  EKG personally reviewed.  Narrow complex irregular rhythm consistent with atrial fibrillation.  Rapid ventricular rate at 132.  LVH.  Repolarization changes consistent with prior EKG obtained on 03/22/2022.  CT Angio Chest PE W and/or Wo Contrast  Result Date: 04/21/2022 CLINICAL DATA:  76 year old female presenting for evaluation of suspected pulmonary embolism, history of pulmonary nodules and history of breast cancer. * Tracking Code: BO * EXAM: CT ANGIOGRAPHY CHEST WITH CONTRAST TECHNIQUE: Multidetector CT imaging of the chest was performed using the standard protocol during bolus administration of intravenous contrast. Multiplanar CT image reconstructions and MIPs were obtained to evaluate the vascular anatomy. RADIATION DOSE REDUCTION: This exam was performed according to the departmental dose-optimization program which includes automated exposure control, adjustment of the mA and/or kV according to patient size and/or use of iterative reconstruction technique. CONTRAST:  58m OMNIPAQUE IOHEXOL 350 MG/ML SOLN COMPARISON:  Previous imaging from July 25, 2021. FINDINGS: Cardiovascular: Calcified aortic atherosclerotic changes. Watchman placement in the LEFT atrial appendage. Heart size top normal. No  pericardial effusion or sign of pericardial nodularity. Central pulmonary arteries are opacified to 533 Hounsfield units. This study is negative for pulmonary embolism. Mediastinum/Nodes: No thoracic inlet, axillary, mediastinal or hilar adenopathy. Esophagus grossly normal. Lungs/Pleura: Mild mosaic attenuation throughout the chest similar to previous imaging. Pulmonary nodule in the RIGHT middle lobe along the major fissure (image 51/5) 6 mm, unchanged  compared to imaging from February. RIGHT middle lobe nodule (image 156/6) 5 mm, unchanged compared to February imaging. No new pulmonary nodules. Stable LEFT lower lobe nodule at 4 mm (image 73/5) also stable LEFT lower lobe nodule at 3 mm (image 68/5) airways are patent. No pneumothorax. No sign of pleural effusion. Upper Abdomen: Reflux of contrast into hepatic veins and IVC. No acute process in the upper abdomen to the extent evaluated. Musculoskeletal: No acute bone finding. No destructive bone process. Spinal degenerative changes. Review of the MIP images confirms the above findings. IMPRESSION: 1. Negative for pulmonary embolism. 2. Mild mosaic attenuation throughout the chest similar to previous imaging, can be seen in the setting of small airways disease. 3. Stable bilateral pulmonary nodules, unchanged compared to imaging from February imaging. Also unchanged compared to imaging dating back to May of 2021 in this patient with history of breast cancer. 4. Reflux of contrast into hepatic veins and IVC, can be seen in the setting of right heart dysfunction. 5. Aortic atherosclerosis. Aortic Atherosclerosis (ICD10-I70.0). Electronically Signed   By: Zetta Bills M.D.   On: 04/21/2022 15:56   DG Chest Port 1 View  Result Date: 04/21/2022 CLINICAL DATA:  Atrial fibrillation, hypertension EXAM: PORTABLE CHEST 1 VIEW COMPARISON:  Portable exam 1450 hours compared to 04/07/2022 FINDINGS: Enlargement of cardiac silhouette with pulmonary vascular congestion. Atherosclerotic calcification aorta. Lungs clear. No pulmonary infiltrate, pleural effusion, or pneumothorax. Mild osseous demineralization. IMPRESSION: Enlargement of cardiac silhouette with pulmonary vascular congestion. No acute abnormalities. Aortic Atherosclerosis (ICD10-I70.0). Electronically Signed   By: Lavonia Dana M.D.   On: 04/21/2022 14:59    There are no new results to review at this time.  Assessment and Plan: * Atrial fibrillation with  RVR ([place]) Patient presenting with A-fib with RVR previously well-controlled with metoprolol 12.5 mg daily.  She has been responding well to diltiazem drip; I suspect on discharge, she will either require higher dose of metoprolol or a combination of metoprolol and diltiazem.  If rate control remains difficult to achieve, could consider cardiology consultation for antiarrhythmics.  - Telemetry monitoring - Continue diltiazem infusion with goal heart rate below 100 - Once that is stable rate, could transition to p.o. - Restart home metoprolol tomorrow morning as patient already took her dose today  Demand ischemia In the setting of significant RVR.  No chest pain or EKG changes concerning for acute ischemia.  Chronic heart failure with preserved ejection fraction (HFpEF) ([place]) Patient is euvolemic on examination although she does have mild Rales.  BNP is comparable to what it was 2 weeks ago.  However specific gravity of urine significantly elevated.  Will gently rehydrate with 500 cc over the next 6 hours.  Essential hypertension - Holding home amlodipine in the setting of diltiazem infusion  Advance Care Planning:   Code Status: Full Code   Consults: None  Family Communication: Patient's niece updated at bedside  Severity of Illness: The appropriate patient status for this patient is OBSERVATION. Observation status is judged to be reasonable and necessary in order to provide the required intensity of service to ensure the patient's safety. The patient's  presenting symptoms, physical exam findings, and initial radiographic and laboratory data in the context of their medical condition is felt to place them at decreased risk for further clinical deterioration. Furthermore, it is anticipated that the patient will be medically stable for discharge from the hospital within 2 midnights of admission.   Author: Jose Persia, MD 04/21/2022 9:59 PM  For on call review [email or web address].

## 2022-04-21 NOTE — ED Provider Notes (Signed)
-----------------------------------------   6:20 PM on 04/21/2022 -----------------------------------------  I took over care on this patient from Dr. Jari Pigg.  The patient's rate did respond briefly to initial IV Cardizem although then the rate went back up to the 110s to 140s, so started her on IV Cardizem infusion.  Troponin is also somewhat elevated although I suspect that this is rate related.  I consulted Dr. Charleen Kirks from the hospitalist service; based on our discussion she agrees to admit the patient.    Arta Silence, MD 04/21/22 Vernelle Emerald

## 2022-04-21 NOTE — Assessment & Plan Note (Signed)
In the setting of significant RVR.  No chest pain or EKG changes concerning for acute ischemia.

## 2022-04-21 NOTE — ED Triage Notes (Signed)
Pt to ED via [facility] from [place] for A. Fib and hypotension. Pt states that she has been in A. Fib the past few days, she called her MD who upped her medication but it still has not gotten any better. EMS states that pt has not been hypotensive with them. Pt was in A Fib. Rate up to 183. Pt is c/o some lightheadedness.

## 2022-04-22 ENCOUNTER — Encounter: Payer: Self-pay | Admitting: Internal Medicine

## 2022-04-22 ENCOUNTER — Observation Stay (HOSPITAL_BASED_OUTPATIENT_CLINIC_OR_DEPARTMENT_OTHER)
Admit: 2022-04-22 | Discharge: 2022-04-22 | Disposition: A | Discharge status: Home / Self Care | Payer: Medicare Other | Attending: Cardiology | Admitting: Cardiology

## 2022-04-22 DIAGNOSIS — I480 Paroxysmal atrial fibrillation: Principal | ICD-10-CM

## 2022-04-22 DIAGNOSIS — I4891 Unspecified atrial fibrillation: Secondary | ICD-10-CM | POA: Diagnosis not present

## 2022-04-22 LAB — ECHOCARDIOGRAM COMPLETE
AR max vel: 1.71 cm2
AV Peak grad: 8.3 mmHg
Ao pk vel: 1.44 m/s
Area-P 1/2: 3 cm2
Calc EF: 67.6 %
S' Lateral: 2 cm
Single Plane A2C EF: 72 %
Single Plane A4C EF: 59.9 %

## 2022-04-22 LAB — BASIC METABOLIC PANEL
Anion gap: 8 (ref 5–15)
BUN: 15 mg/dL (ref 8–23)
CO2: 23 mmol/L (ref 22–32)
Calcium: 9 mg/dL (ref 8.9–10.3)
Chloride: 111 mmol/L (ref 98–111)
Creatinine, Ser: 0.48 mg/dL (ref 0.44–1.00)
GFR, Estimated: >60 mL/min (ref >60)
Glucose, Bld: 123 mg/dL — ABNORMAL HIGH (ref 70–99)
Potassium: 4.2 mmol/L (ref 3.5–5.1)
Sodium: 142 mmol/L (ref 135–145)

## 2022-04-22 LAB — CBC
HCT: 37.9 % (ref 36.0–46.0)
Hemoglobin: 11.9 g/dL — ABNORMAL LOW (ref 12.0–15.0)
MCH: 27.2 pg (ref 26.0–34.0)
MCHC: 31.4 g/dL (ref 30.0–36.0)
MCV: 86.5 fL (ref 80.0–100.0)
Platelets: 385 10*3/uL (ref 150–400)
RBC: 4.38 MIL/uL (ref 3.87–5.11)
RDW: 16.1 % — ABNORMAL HIGH (ref 11.5–15.5)
WBC: 5.5 10*3/uL (ref 4.0–10.5)
nRBC: 0 % (ref 0.0–0.2)

## 2022-04-22 MED ORDER — DILTIAZEM HCL 30 MG PO TABS
30.0000 mg | ORAL_TABLET | Freq: Four times a day (QID) | ORAL | Status: DC
  Administered 2022-04-22 – 2022-04-23 (×5): 30 mg via ORAL
  Filled 2022-04-22 (×6): qty 1

## 2022-04-22 MED ORDER — INFLUENZA VAC A&B SA ADJ QUAD 0.5 ML IM PRSY
0.5000 mL | PREFILLED_SYRINGE | INTRAMUSCULAR | Status: DC
  Filled 2022-04-22: qty 0.5

## 2022-04-22 MED ORDER — FAMOTIDINE 20 MG PO TABS: 20.0000 mg | ORAL_TABLET | Freq: Two times a day (BID) | ORAL | Status: DC | PRN

## 2022-04-22 MED ORDER — PERFLUTREN LIPID MICROSPHERE
1.0000 mL | INTRAVENOUS | Status: AC | PRN
  Administered 2022-04-22: 3 mL via INTRAVENOUS

## 2022-04-22 NOTE — Progress Notes (Signed)
[facility] Progress Note  Sandra Sandra [name]: Sandra Sandra [name]    WLN:[id number]  DOA: 04/21/2022     Date of Service: Sandra Sandra Sandra [name] was seen and examined on 04/22/2022  Chief Complaint  Sandra Sandra [name] presents with   Atrial Fibrillation   Brief hospital course: Sandra Sandra [name] is a 76 y.o. female with medical history significant of HFpEF with a EF of 60-65%, paroxysmal atrial fibrillation s/p watchman's, hemorrhagic CVA, nonobstructive CAD, celiac disease, hypothyroidism, Raynaud's, prediabetes who presents to Sandra Sandra [name] with elevated heart rate  Sandra Sandra [name] states that yesterday morning, she noticed that she was feeling lightheaded and was experiencing generalized weakness.  Symptoms are particularly worse when going from a sitting to a standing position. Her symptoms came and went throughout Sandra day persistently, however she did not want to go to Sandra ER at that time.  Today, she went to her local pharmacy and asked them to check her blood pressure and heart rate.  They told her her blood pressure was very low and her heart rate was very high and EMS was called. Sandra Sandra [name] denies any other acute complaints at this time including chest pain, shortness of breath, lower extremity edema.  She has been eating and drinking normally.   On EMS arrival, EKG was consistent with atrial fibrillation with heart rate ranging between 101 152.  Her blood pressure was 147/82.   On arrival to Sandra Sandra [name], Sandra Sandra [name] was hypertensive at 149/81 with heart rate of 140.  She was saturating at 97% on room air.  BNP elevated at 691, similar to prior BMP obtained approximately 2 weeks ago.  Troponin elevated at 53.  D-dimer elevated; CTA was obtained and negative for PE.  Chest x-ray with some vascular congestion but not seen on CTA.  Sandra Sandra [name] was started on diltiazem infusion and [facility] contacted for admission.    Assessment and Plan: *Paroxysmal atrial fibrillation with RVR ([place]) Sandra Sandra [name] presenting with A-fib with RVR previously well-controlled  with metoprolol 12.5 mg daily.  She has been responding well to diltiazem drip; I suspect on discharge, she will either require higher dose of metoprolol or a combination of metoprolol and diltiazem.   cardiology consulted for antiarrhythmics.  Started p.o. Cardizem, follow repeat TTE, amiodarone not ideal with thyroid dysfunction, Outpatient EP follow-up for antiarrhythmic consideration.  - Telemetry monitoring - Continue diltiazem infusion with goal heart rate below 100 - started Cardizem 30 mg p.o. every 6 hourly, as per cardiology, need to titrate off Cardizem infusion. - Restart home metoprolol  Follow TTE Demand ischemia In Sandra setting of significant RVR.  No chest pain or EKG changes concerning for acute ischemia. Trop 53--44 trended drawn and remained flat  Chronic heart failure with preserved ejection fraction (HFpEF) ([place]) Sandra Sandra [name] is euvolemic on examination although she does have mild Rales.  BNP is comparable to what it was 2 weeks ago.  However specific gravity of urine significantly elevated.  gently rehydrate with 500 cc for 6 hrs given BNP 691   Essential hypertension - Holding home amlodipine in Sandra setting of diltiazem infusion     Diet: Regular diet DVT Prophylaxis: Subcutaneous Lovenox   Advance goals of care discussion: Full code  Family Communication: family was NOT present at bedside, at Sandra time of interview.  Sandra Sandra [name] provided permission to discuss medical plan with Sandra family. Opportunity was given to ask question and all questions were answered satisfactorily.   Disposition:  Sandra [name] is from home, admitted with chest pain and A-fib with RVR, still has  irregular rhythm, seen by cardiology, which precludes a safe discharge. Discharge to home, when when cleared by cardiology, may need to stay overnight to be monitored on telemetry. Plan for discharge tomorrow a.m. if cleared by cardiology.  Subjective: No significant events overnight.  Sandra Sandra [name]'s heart rate is  controlled now on Cardizem IV infusion, denies any palpitations, no chest pain at this time.  Denies any shortness of breath, no any other active issues. Sandra Sandra [name] was complaining of some heartburn but she does not want PPI at this time.  Physical Exam: General:  alert oriented to time, place, and person.  Appear in mild distress, affect appropriate Eyes: PERRLA ENT: Oral Mucosa Clear, moist  Neck: no JVD,  Cardiovascular: Irregular rhythm, no Murmur,  Respiratory: good respiratory effort, Bilateral Air entry equal and Decreased, no Crackles, no wheezes Abdomen: Bowel Sound present, Soft and no tenderness,  Skin: no rahses Extremities: no Pedal edema, no calf tenderness Neurologic: without any new focal findings Gait not checked due to Sandra Sandra [name] safety concerns  Vitals:   04/22/22 0900 04/22/22 0935 04/22/22 1047 04/22/22 1235  BP: 117/69   126/87  Pulse: 97 71  96  Resp: 16 19  16   Temp:   98.2 F (36.8 C)   TempSrc:   Oral   SpO2: 100% 97%  97%    Intake/Output Summary (Last 24 hours) at 04/22/2022 1406 Last data filed at 04/21/2022 1520 Gross per 24 hour  Intake 75 ml  Output --  Net 75 ml   There were no vitals filed for this visit.  Data Reviewed: I have personally reviewed and interpreted daily labs, tele strips, imagings as discussed above. I reviewed all nursing notes, pharmacy notes, vitals, pertinent old records I have discussed plan of care as described above with RN and Sandra Sandra [name].  CBC: Recent Labs  Lab 04/21/22 1436 04/22/22 0624  WBC 8.1 5.5  HGB 12.0 11.9*  HCT 38.6 37.9  MCV 86.5 86.5  PLT 433* 119   Basic Metabolic Panel: Recent Labs  Lab 04/21/22 1436 04/22/22 0624  NA 140 142  K 3.9 4.2  CL 106 111  CO2 25 23  GLUCOSE 88 123*  BUN 20 15  CREATININE 0.73 0.48  CALCIUM 9.3 9.0  MG 2.2  --     Studies: CT Angio Chest PE W and/or Wo Contrast  Result Date: 04/21/2022 CLINICAL DATA:  76 year old female presenting for evaluation of  suspected pulmonary embolism, history of pulmonary nodules and history of breast cancer. * Tracking Code: BO * EXAM: CT ANGIOGRAPHY CHEST WITH CONTRAST TECHNIQUE: Multidetector CT imaging of Sandra chest was performed using Sandra standard protocol during bolus administration of intravenous contrast. Multiplanar CT image reconstructions and MIPs were obtained to evaluate Sandra vascular anatomy. RADIATION DOSE REDUCTION: This exam was performed according to Sandra departmental dose-optimization program which includes automated exposure control, adjustment of the mA and/or kV according to Sandra Sandra [name] size and/or use of iterative reconstruction technique. CONTRAST:  43m OMNIPAQUE IOHEXOL 350 MG/ML SOLN COMPARISON:  Previous imaging from July 25, 2021. FINDINGS: Cardiovascular: Calcified aortic atherosclerotic changes. Watchman placement in Sandra LEFT atrial appendage. Heart size top normal. No pericardial effusion or sign of pericardial nodularity. Central pulmonary arteries are opacified to 533 Hounsfield units. This study is negative for pulmonary embolism. Mediastinum/Nodes: No thoracic inlet, axillary, mediastinal or hilar adenopathy. Esophagus grossly normal. Lungs/Pleura: Mild mosaic attenuation throughout Sandra chest similar to previous imaging. Pulmonary nodule in Sandra RIGHT middle lobe along Sandra major fissure (image 51/5) 6 mm, unchanged  compared to imaging from February. RIGHT middle lobe nodule (image 156/6) 5 mm, unchanged compared to February imaging. No new pulmonary nodules. Stable LEFT lower lobe nodule at 4 mm (image 73/5) also stable LEFT lower lobe nodule at 3 mm (image 68/5) airways are patent. No pneumothorax. No sign of pleural effusion. Upper Abdomen: Reflux of contrast into hepatic veins and IVC. No acute process in Sandra upper abdomen to Sandra extent evaluated. Musculoskeletal: No acute bone finding. No destructive bone process. Spinal degenerative changes. Review of Sandra MIP images confirms Sandra above findings.  IMPRESSION: 1. Negative for pulmonary embolism. 2. Mild mosaic attenuation throughout Sandra chest similar to previous imaging, can be seen in Sandra setting of small airways disease. 3. Stable bilateral pulmonary nodules, unchanged compared to imaging from February imaging. Also unchanged compared to imaging dating back to May of 2021 in this Sandra Sandra [name] with history of breast cancer. 4. Reflux of contrast into hepatic veins and IVC, can be seen in Sandra setting of right heart dysfunction. 5. Aortic atherosclerosis. Aortic Atherosclerosis (ICD10-I70.0). Electronically Signed   By: Zetta Bills M.D.   On: 04/21/2022 15:56   DG Chest Port 1 View  Result Date: 04/21/2022 CLINICAL DATA:  Atrial fibrillation, hypertension EXAM: PORTABLE CHEST 1 VIEW COMPARISON:  Portable exam 1450 hours compared to 04/07/2022 FINDINGS: Enlargement of cardiac silhouette with pulmonary vascular congestion. Atherosclerotic calcification aorta. Lungs clear. No pulmonary infiltrate, pleural effusion, or pneumothorax. Mild osseous demineralization. IMPRESSION: Enlargement of cardiac silhouette with pulmonary vascular congestion. No acute abnormalities. Aortic Atherosclerosis (ICD10-I70.0). Electronically Signed   By: Lavonia Dana M.D.   On: 04/21/2022 14:59    Scheduled Meds:  aspirin EC  81 mg Oral Daily   atorvastatin  40 mg Oral Daily   diltiazem  30 mg Oral QID   enoxaparin (LOVENOX) injection  40 mg Subcutaneous Q24H   iron polysaccharides  150 mg Oral Daily   latanoprost  1 drop Both Eyes QHS   levothyroxine  100 mcg Oral QAC breakfast   metoprolol succinate  12.5 mg Oral Daily   multivitamin with minerals  1 tablet Oral Daily   pantoprazole  40 mg Oral Daily   sertraline  100 mg Oral Daily   sodium chloride flush  3 mL Intravenous Q12H   timolol  1 drop Both Eyes BID   Continuous Infusions:  diltiazem (CARDIZEM) infusion Stopped (04/22/22 1308)   PRN Meds: acetaminophen **OR** acetaminophen, ALPRAZolam, bisacodyl,  HYDROcodone-acetaminophen, ondansetron **OR** ondansetron (ZOFRAN) IV, polyethylene glycol  Time spent: 35 minutes  Author: Val Riles. MD [facility] 04/22/2022 2:06 PM  To reach On-call, see care teams to locate Sandra attending and reach out to them via [email or web address]. If 7PM-7AM, please contact night-coverage If you still have difficulty reaching Sandra attending provider, please page [facility] (Director on Call) for [facility] on amion for assistance.

## 2022-04-22 NOTE — Consult Note (Signed)
Cardiology Consultation   Patient ID: CHAMPAGNE PALETTA MRN: [id number]; DOB: [birth year]  Admit date: 04/21/2022 Date of Consult: 04/22/2022  PCP:  Waylan Rocher, MD   [facility] Prov[facility] Cardiologist:  Nelva Bush, MD  Electrophysiologist:  Vickie Epley, MD       Patient Profile:   BRINLYNN GORTON is a 76 y.o. female with a hx of paroxysmal atrial fibrillation, frequent falls, hypothyroidism who is being seen 04/22/2022 for the evaluation of atrial fibrillation with rapid ventricular response at the request of Dr.Kumar.  History of Present Illness:   Ms. Demedeiros is a 76 year old female with history of paroxysmal atrial fibrillation, not on anticoagulation due to frequent falls, intracranial hemorrhage, s/p Watchman 01/2021, hypothyroidism presenting with dizziness, hypotension and A-fib RVR.  She states feeling dizzy over the past 2 days especially with moving from sitting to standing position.  Check blood pressures at home, states being low although she does not remember actual numbers.  She saw her primary care provider, was evaluated with vitals showed hypotension and tachycardia.  Advised to come to the ED.  In the ED, EKG shows A-fib RVR heart rate 132.  Started on IV Cardizem, with improvement in heart rate.  Patient noted to have significant symptoms when in atrial fibrillation.  Not on anticoagulation due to prior intracranial hemorrhage on Eliquis, also has frequent falls.  Echo a year ago EF 60 to 65%.  States feeling better with improvement in heart rates.  Although she has not ambulated much.  Typically uses a walker.   Past Medical History:  Diagnosis Date   (HFpEF) heart failure with preserved ejection fraction ([place])    a. 08/2019 Echo: EF 60-65%, no rwma, mild LVH, Gr2 DD, nl RV fxn. Nl pASP. Mildly dil LA. Mild to mod MR.   Anemia    Anxiety    Arthritis    BRCA gene mutation negative 10/2016   NEGATIVE: [facility]   Breast cancer ([place])  10/16/2016   T1c,N0;, GRADE I/III, 1.6cm. ER/PR pos  HER2 not over expressed, Right Upper Outer   Celiac disease    Celiac syndrome    Cerebellar hemorrhage, acute ([place]) 07/17/2020   Depression    Dyspnea    WITH EXERTION   Fatty liver    GERD (gastroesophageal reflux disease)    OCC   Glaucoma    right eye   Headache    H/O MIGRAINES AS TEENAGER.   Heart murmur    a. 08/2019 Echo: mild to mod MR.   Hypertension    Hypothyroidism    Non-obstructive CAD (coronary artery disease)    a. 01/2017 MV: Hypertensive response. No ischemia/infarct. EF >65%; b. 09/2019 Cor CTA: Ca2+ = 9.29 (36th percentile). LAD calcified plaque (0-24%), otw nl. Multipel bilat pulm nodules up to 74m.   Osteoporosis    [place]    a. 02/2020 [facility]: predominantly RSR, 65 (50-105), rare PACs/PVCs, 8 beats NSVT, multiple episodes of PAF lasting up to 1hr 235ms. Avg AF rate 130 (93-170). AF burden <1%. Triggered events = RSR, PACs, and PAF; b. CHA2DS2VASc = 6.   Pneumonia    YEARS AGO   Pre-diabetes    Presence of Watchman left atrial appendage closure device 01/2021   Pulmonary nodules    a. 09/2019 Cor CTA: incidental finding of multiple bilat pulm nodules; b. 12/2019 High Res CT: stable, scattered solid pulm nodules.   Raynaud's disease    RLS (restless legs syndrome)  Stroke ([facility]) 06/2020   mild balance issues   Wears hearing aid in both ears     Past Surgical History:  Procedure Laterality Date   ABDOMINAL HYSTERECTOMY     APPENDECTOMY     AXILLARY LYMPH NODE BIOPSY Right 03/05/2017   Procedure: AXILLARY LYMPH NODE BIOPSY;  Surgeon: Robert Bellow, MD;  Location: [facility] ORS;  Service: General;  Laterality: Right;   CATARACT EXTRACTION Bilateral    CESAREAN SECTION     COLONOSCOPY WITH PROPOFOL N/A 05/19/2015   Procedure: COLONOSCOPY WITH PROPOFOL;  Surgeon: Manya Silvas, MD;  Location: [facility] ENDOSCOPY;  Service: Endoscopy;  Laterality: N/A;   COLONOSCOPY WITH  PROPOFOL N/A 08/01/2019   Procedure: COLONOSCOPY WITH PROPOFOL;  Surgeon: Lucilla Lame, MD;  Location: [facility] ENDOSCOPY;  Service: Endoscopy;  Laterality: N/A;   EYE SURGERY     HAND SURGERY Left    LEFT ATRIAL APPENDAGE OCCLUSION N/A 01/27/2021   Procedure: LEFT ATRIAL APPENDAGE OCCLUSION;  Surgeon: Vickie Epley, MD;  Location: [place] CV LAB;  Service: Cardiovascular;  Laterality: N/A;   MASTECTOMY Right 11/07/2016   RESIDUAL INVASIVE MAMMARY CARCINOMA, SUBAREOLAR ANTERIOR TO PREVIOUS    OPEN REDUCTION INTERNAL FIXATION (ORIF) DISTAL RADIAL FRACTURE Right 04/11/2022   Procedure: OPEN REDUCTION INTERNAL FIXATION (ORIF) DISTAL RADIUS FRACTURE;  Surgeon: Hessie Knows, MD;  Location: [place];  Service: Orthopedics;  Laterality: Right;   SENTINEL NODE BIOPSY Right 11/07/2016   Procedure: SENTINEL NODE BIOPSY;  Surgeon: Robert Bellow, MD;  Location: [facility] ORS;  Service: General;  Laterality: Right;   SIMPLE MASTECTOMY WITH AXILLARY SENTINEL NODE BIOPSY Right 11/07/2016   6 mm ER/PR 100%; Her 2 neu not overexpressed, T1b, N0.  Surgeon: Robert Bellow, MD;  Location: [facility] ORS;  Service: General;  Laterality: Right;   TEE WITHOUT CARDIOVERSION N/A 01/27/2021   Procedure: TRANSESOPHAGEAL ECHOCARDIOGRAM (TEE);  Surgeon: Vickie Epley, MD;  Location: [place] CV LAB;  Service: Cardiovascular;  Laterality: N/A;   TEE WITHOUT CARDIOVERSION N/A 03/10/2021   Procedure: TRANSESOPHAGEAL ECHOCARDIOGRAM (TEE);  Surgeon: Elouise Munroe, MD;  Location: [place];  Service: Cardiology;  Laterality: N/A;   TONSILLECTOMY  AGE 70   UPPER GI ENDOSCOPY  01/12/06   hiatus hernia     Home Medications:  Prior to Admission medications   Medication Sig Start Date End Date Taking? Authorizing Provider  ALPRAZolam Duanne Moron) 0.5 MG tablet Take 1 tablet (0.5 mg total) by mouth at bedtime as needed. for sleep Patient taking differently: Take 0.25 mg by mouth at bedtime as needed for sleep  (Restless Legs). for sleep 03/24/22  Yes Birdie Sons, MD  amLODipine (NORVASC) 10 MG tablet Take 0.5 tablets (5 mg total) by mouth daily. Home med 04/09/22  Yes Enzo Bi, MD  aspirin EC 81 MG tablet Take 1 tablet (81 mg total) by mouth daily. Swallow whole. 07/27/21  Yes Vickie Epley, MD  atorvastatin (LIPITOR) 40 MG tablet TAKE 1 TABLET BY MOUTH DAILY Patient taking differently: Take 40 mg by mouth daily. 09/02/21  Yes Dunn, Areta Haber, PA-C  Calcium Carb-Cholecalciferol (CALCIUM 600 + D PO) Take 1 tablet by mouth daily.   Yes [provider]  Cholecalciferol (VITAMIN D3) 125 MCG (5000 UT) TABS Take 5,000 Units by mouth daily.   Yes [provider]  iron polysaccharides (NIFEREX) 150 MG capsule Take 1 capsule (150 mg total) by mouth daily. Or can take any over-the-counter iron supplement. 04/09/22  Yes Enzo Bi, MD  latanoprost (  XALATAN) 0.005 % ophthalmic solution Place 1 drop into both eyes at bedtime.   Yes [provider]  levothyroxine (SYNTHROID) 100 MCG tablet TAKE 1 TABLET BY MOUTH 30  MINUTES BEFORE BREAKFAST Patient taking differently: Take 100 mcg by mouth daily before breakfast. TAKE 1 TABLET BY MOUTH 30  MINUTES BEFORE BREAKFAST 02/02/22  Yes Jerrol Banana., MD  metoprolol succinate (TOPROL-XL) 25 MG 24 hr tablet Take 0.5 tablets (12.5 mg total) by mouth daily. May take additional 12.5 mg for A Fib.  Home med. 04/09/22  Yes Enzo Bi, MD  Multiple Vitamin (MULTIVITAMIN WITH MINERALS) TABS tablet Take 1 tablet by mouth daily at 3 pm.   Yes [provider]  omeprazole (PRILOSEC) 40 MG capsule TAKE 1 CAPSULE BY MOUTH DAILY Patient taking differently: Take 40 mg by mouth daily. 02/02/22  Yes Jerrol Banana., MD  potassium chloride SA (KLOR-CON M) 20 MEQ tablet Take 1 tablet (20 mEq total) by mouth daily for 7 days. 04/09/22 04/21/22 Yes Enzo Bi, MD  sertraline (ZOLOFT) 100 MG tablet TAKE 1 TABLET BY MOUTH DAILY Patient taking  differently: Take 100 mg by mouth daily. 12/05/21  Yes Jerrol Banana., MD  timolol (BETIMOL) 0.5 % ophthalmic solution Place 1 drop into both eyes 2 (two) times daily.   Yes [provider]  acetaminophen (TYLENOL) 325 MG tablet Take 2 tablets (650 mg total) by mouth every 6 (six) hours as needed for mild pain (or Fever >/= 101). 04/03/22   Rai, Vernelle Emerald, MD  bisacodyl 5 MG EC tablet Take 5 mg by mouth daily as needed for moderate constipation.    [provider]  docusate sodium (COLACE) 100 MG capsule Take 100 mg by mouth daily as needed for mild constipation.    [provider]  HYDROcodone-acetaminophen (NORCO/VICODIN) 5-325 MG tablet Take by mouth. 04/06/22   [provider]  hydroxypropyl methylcellulose / hypromellose (ISOPTO TEARS / GONIOVISC) 2.5 % ophthalmic solution Place 1 drop into both eyes 4 (four) times daily as needed for dry eyes.    [provider]  polyethylene glycol (MIRALAX / GLYCOLAX) 17 g packet Take 17 g by mouth daily as needed for moderate constipation.    [provider]  potassium chloride (KLOR-CON) 20 MEQ packet Take 20 mEq by mouth daily. 04/18/22   [provider]    Inpatient Medications: Scheduled Meds:  aspirin EC  81 mg Oral Daily   atorvastatin  40 mg Oral Daily   diltiazem  30 mg Oral QID   enoxaparin (LOVENOX) injection  40 mg Subcutaneous Q24H   iron polysaccharides  150 mg Oral Daily   latanoprost  1 drop Both Eyes QHS   levothyroxine  100 mcg Oral QAC breakfast   metoprolol succinate  12.5 mg Oral Daily   multivitamin with minerals  1 tablet Oral Daily   pantoprazole  40 mg Oral Daily   sertraline  100 mg Oral Daily   sodium chloride flush  3 mL Intravenous Q12H   timolol  1 drop Both Eyes BID   Continuous Infusions:  diltiazem (CARDIZEM) infusion 7.5 mg/hr (04/21/22 1955)   PRN Meds: acetaminophen **OR** acetaminophen, ALPRAZolam, bisacodyl, HYDROcodone-acetaminophen,  ondansetron **OR** ondansetron (ZOFRAN) IV, polyethylene glycol  Allergies:    Allergies  Allergen Reactions   Tape Rash    Social History:   Social History   Socioeconomic History   Marital status: Married    Spouse name: Nadara Mustard   Number of children:  2   Years of education: Not on file   Highest education level: Some college, no degree  Occupational History   Occupation: retired  Tobacco Use   Smoking status: Never   Smokeless tobacco: Never  Vaping Use   Vaping Use: Never used  Substance and Sexual Activity   Alcohol use: Not Currently   Drug use: No   Sexual activity: Not Currently    Birth control/protection: None  Other Topics Concern   Not on file  Social History Narrative   Lives with Husband   Right handed   Drinks 2-3 cups caffiene daily   Social Determinants of Health   Financial Resource Strain: Low Risk  (05/18/2021)   Overall Financial Resource Strain (CARDIA)    Difficulty of Paying Living Expenses: Not hard at all  Food Insecurity: No Food Insecurity (04/07/2022)   Hunger Vital Sign    Worried About Running Out of Food in the Last Year: Never true    [place] in the Last Year: Never true  Transportation Needs: No Transportation Needs (04/07/2022)   PRAPARE - Hydrologist (Medical): No    Lack of Transportation (Non-Medical): No  Physical Activity: Insufficiently Active (05/18/2021)   Exercise Vital Sign    Days of Exercise per Week: 3 days    Minutes of Exercise per Session: 30 min  Stress: No Stress Concern Present (05/18/2021)   [place]    Feeling of Stress : Only a little  Social Connections: Socially Integrated (05/18/2021)   Social Connection and Isolation Panel [NHANES]    Frequency of Communication with Friends and Family: More than three times a week    Frequency of Social Gatherings with Friends and Family: More than three times a  week    Attends Religious Services: More than 4 times per year    Active Member of [employer] or Organizations: Yes    Attends Music therapist: More than 4 times per year    Marital Status: Married  Human resources officer Violence: Not At Risk (04/07/2022)   Humiliation, Afraid, Rape, and Kick questionnaire    Fear of Current or Ex-Partner: No    Emotionally Abused: No    Physically Abused: No    Sexually Abused: No    Family History:    Family History  Problem Relation Age of Onset   Cancer Mother    Anemia Mother    Other Mother        lymphosarcoma   Alcohol abuse Father    Prostate cancer Father    Breast cancer Sister        50; 1/2 sister, shared mother.   Cancer Sister        bile duct   Stroke Maternal Grandmother    Breast cancer Maternal Grandmother 68   Heart attack Maternal Grandfather    CAD Paternal Grandfather      ROS:  Please see the history of present illness.   All other ROS reviewed and negative.     Physical Exam/Data:   Vitals:   04/22/22 0700 04/22/22 0900 04/22/22 0935 04/22/22 1047  BP: 121/68 117/69    Pulse: 94 97 71   Resp: _0 Temp:    98.2 F (36.8 C)  TempSrc:    Oral  SpO2: 97% 100% 97%     Intake/Output Summary (Last 24 hours) at 04/22/2022 1140 Last data filed at 04/21/2022 1520  Gross per 24 hour  Intake 75 ml  Output --  Net 75 ml      04/11/2022   11:16 AM 04/08/2022    5:45 AM 04/07/2022    9:57 AM  Last 3 Weights  Weight (lbs) 132 lb 4.4 oz 132 lb 4.4 oz 132 lb 15 oz  Weight (kg) 60 kg 60 kg 60.3 kg     There is no height or weight on file to calculate BMI.  General:  Well nourished, well developed, in no acute distress HEENT: normal Neck: no JVD Vascular: No carotid bruits; Distal pulses 2+ bilaterally Cardiac: Irregular irregular, nontachycardic Lungs:  clear to auscultation bilaterally, no wheezing, rhonchi or rales  Abd: soft, nontender, no hepatomegaly  Ext: no edema Musculoskeletal:  No  deformities, BUE and BLE strength normal and equal Skin: warm and dry  Neuro:  CNs 2-12 intact, no focal abnormalities noted Psych:  Normal affect   EKG:  The EKG was personally reviewed and demonstrates: Atrial fibrillation Telemetry:  Telemetry was personally reviewed and demonstrates: Atrial fibrillation  Relevant CV Studies: Echo 02/2021 EF 60 to 65%  Laboratory Data:  High Sensitivity Troponin:   Recent Labs  Lab 04/21/22 1436 04/21/22 1824  TROPONINIHS 53* 44*     Chemistry Recent Labs  Lab 04/21/22 1436 04/22/22 0624  NA 140 142  K 3.9 4.2  CL 106 111  CO2 25 23  GLUCOSE 88 123*  BUN 20 15  CREATININE 0.73 0.48  CALCIUM 9.3 9.0  MG 2.2  --   GFRNONAA >60 >60  ANIONGAP 9 8    No results for input(s): "PROT", "ALBUMIN", "AST", "ALT", "ALKPHOS", "BILITOT" in the last 168 hours. Lipids No results for input(s): "CHOL", "TRIG", "HDL", "LABVLDL", "LDLCALC", "CHOLHDL" in the last 168 hours.  Hematology Recent Labs  Lab 04/21/22 1436 04/22/22 0624  WBC 8.1 5.5  RBC 4.46 4.38  HGB 12.0 11.9*  HCT 38.6 37.9  MCV 86.5 86.5  MCH 26.9 27.2  MCHC 31.1 31.4  RDW 15.7* 16.1*  PLT 433* 385   Thyroid  Recent Labs  Lab 04/21/22 1436  TSH 3.815  FREET4 1.16*    BNP Recent Labs  Lab 04/21/22 1436  BNP 691.3*    DDimer  Recent Labs  Lab 04/21/22 1436  DDIMER 1.68*     Radiology/Studies:  CT Angio Chest PE W and/or Wo Contrast  Result Date: 04/21/2022 CLINICAL DATA:  76 year old female presenting for evaluation of suspected pulmonary embolism, history of pulmonary nodules and history of breast cancer. * Tracking Code: BO * EXAM: CT ANGIOGRAPHY CHEST WITH CONTRAST TECHNIQUE: Multidetector CT imaging of the chest was performed using the standard protocol during bolus administration of intravenous contrast. Multiplanar CT image reconstructions and MIPs were obtained to evaluate the vascular anatomy. RADIATION DOSE REDUCTION: This exam was performed according  to the departmental dose-optimization program which includes automated exposure control, adjustment of the mA and/or kV according to patient size and/or use of iterative reconstruction technique. CONTRAST:  26m OMNIPAQUE IOHEXOL 350 MG/ML SOLN COMPARISON:  Previous imaging from July 25, 2021. FINDINGS: Cardiovascular: Calcified aortic atherosclerotic changes. Watchman placement in the LEFT atrial appendage. Heart size top normal. No pericardial effusion or sign of pericardial nodularity. Central pulmonary arteries are opacified to 533 Hounsfield units. This study is negative for pulmonary embolism. Mediastinum/Nodes: No thoracic inlet, axillary, mediastinal or hilar adenopathy. Esophagus grossly normal. Lungs/Pleura: Mild mosaic attenuation throughout the chest similar to previous imaging. Pulmonary nodule in the RIGHT middle lobe along  the major fissure (image 51/5) 6 mm, unchanged compared to imaging from February. RIGHT middle lobe nodule (image 156/6) 5 mm, unchanged compared to February imaging. No new pulmonary nodules. Stable LEFT lower lobe nodule at 4 mm (image 73/5) also stable LEFT lower lobe nodule at 3 mm (image 68/5) airways are patent. No pneumothorax. No sign of pleural effusion. Upper Abdomen: Reflux of contrast into hepatic veins and IVC. No acute process in the upper abdomen to the extent evaluated. Musculoskeletal: No acute bone finding. No destructive bone process. Spinal degenerative changes. Review of the MIP images confirms the above findings. IMPRESSION: 1. Negative for pulmonary embolism. 2. Mild mosaic attenuation throughout the chest similar to previous imaging, can be seen in the setting of small airways disease. 3. Stable bilateral pulmonary nodules, unchanged compared to imaging from February imaging. Also unchanged compared to imaging dating back to May of 2021 in this patient with history of breast cancer. 4. Reflux of contrast into hepatic veins and IVC, can be seen in the  setting of right heart dysfunction. 5. Aortic atherosclerosis. Aortic Atherosclerosis (ICD10-I70.0). Electronically Signed   By: Zetta Bills M.D.   On: 04/21/2022 15:56   DG Chest Port 1 View  Result Date: 04/21/2022 CLINICAL DATA:  Atrial fibrillation, hypertension EXAM: PORTABLE CHEST 1 VIEW COMPARISON:  Portable exam 1450 hours compared to 04/07/2022 FINDINGS: Enlargement of cardiac silhouette with pulmonary vascular congestion. Atherosclerotic calcification aorta. Lungs clear. No pulmonary infiltrate, pleural effusion, or pneumothorax. Mild osseous demineralization. IMPRESSION: Enlargement of cardiac silhouette with pulmonary vascular congestion. No acute abnormalities. Aortic Atherosclerosis (ICD10-I70.0). Electronically Signed   By: Lavonia Dana M.D.   On: 04/21/2022 14:59     Assessment and Plan:   Paroxysmal A-fib with RVR -Heart rate improved with IV Cardizem -Start p.o. Cardizem 30 every 6, titrate off Cardizem drip -No anticoagulation due to history of falls, intracranial hemorrhage -S/p Watchman, aspirin okay. -Repeat echo -Amiodarone not ideal with thyroid dysfunction. -Ambulate with walker/assistance when off Cardizem drip -If stays asymptomatic, and heart rates controlled, discharge on oral Cardizem. -Continue Toprol-XL 12.5 mg daily. -Outpatient EP follow-up for antiarrhythmic consideration.   Total encounter time more than 85 minutes  Greater than 50% was spent in counseling and coordination of care with the patient  Signed, Kate Sable, MD  04/22/2022 11:40 AM

## 2022-04-22 NOTE — TOC CM/SW Note (Signed)
Patient is active with [facility] - for PT and OT.  [name], [place]

## 2022-04-22 NOTE — ED Notes (Signed)
Cardizem titrated down to 3

## 2022-04-22 NOTE — ED Notes (Signed)
Updated daughter on pt's progress

## 2022-04-22 NOTE — Progress Notes (Signed)
  Echocardiogram 2D Echocardiogram has been performed. Definity IV ultrasound imaging agent used on this study.  Sandra [name] 04/22/2022, 3:42 PM

## 2022-04-22 NOTE — ED Notes (Signed)
Cardizem titrated down to 5

## 2022-04-23 DIAGNOSIS — I480 Paroxysmal atrial fibrillation: Secondary | ICD-10-CM | POA: Diagnosis not present

## 2022-04-23 DIAGNOSIS — I4891 Unspecified atrial fibrillation: Secondary | ICD-10-CM | POA: Diagnosis not present

## 2022-04-23 LAB — CBC
HCT: 35.8 % — ABNORMAL LOW (ref 36.0–46.0)
Hemoglobin: 11.3 g/dL — ABNORMAL LOW (ref 12.0–15.0)
MCH: 27.2 pg (ref 26.0–34.0)
MCHC: 31.6 g/dL (ref 30.0–36.0)
MCV: 86.1 fL (ref 80.0–100.0)
Platelets: 349 10*3/uL (ref 150–400)
RBC: 4.16 MIL/uL (ref 3.87–5.11)
RDW: 15.8 % — ABNORMAL HIGH (ref 11.5–15.5)
WBC: 4.9 10*3/uL (ref 4.0–10.5)
nRBC: 0 % (ref 0.0–0.2)

## 2022-04-23 LAB — PHOSPHORUS: Phosphorus: 3.8 mg/dL (ref 2.5–4.6)

## 2022-04-23 LAB — BASIC METABOLIC PANEL
Anion gap: 9 (ref 5–15)
BUN: 14 mg/dL (ref 8–23)
CO2: 24 mmol/L (ref 22–32)
Calcium: 8.8 mg/dL — ABNORMAL LOW (ref 8.9–10.3)
Chloride: 109 mmol/L (ref 98–111)
Creatinine, Ser: 0.64 mg/dL (ref 0.44–1.00)
GFR, Estimated: >60 mL/min (ref >60)
Glucose, Bld: 112 mg/dL — ABNORMAL HIGH (ref 70–99)
Potassium: 3.8 mmol/L (ref 3.5–5.1)
Sodium: 142 mmol/L (ref 135–145)

## 2022-04-23 LAB — MAGNESIUM: Magnesium: 2 mg/dL (ref 1.7–2.4)

## 2022-04-23 MED ORDER — DILTIAZEM HCL ER COATED BEADS 180 MG PO CP24
180.0000 mg | ORAL_CAPSULE | Freq: Every day | ORAL | Status: DC
  Administered 2022-04-23: 180 mg via ORAL
  Filled 2022-04-23: qty 1

## 2022-04-23 MED ORDER — DILTIAZEM HCL ER COATED BEADS 180 MG PO CP24: 180.0000 mg | ORAL_CAPSULE | Freq: Every day | ORAL | 2 refills | Status: DC

## 2022-04-23 NOTE — TOC Initial Note (Signed)
Transition of Care [facility]) - Initial/Assessment Note    Patient Details  Name: Sandra [name] MRN: [id number] Date of Birth: 1946/05/27  Transition of Care [facility]) CM/SW Contact:    Magnus Ivan, LCSW Phone Number: 04/23/2022, 10:05 AM  Clinical Narrative:           CSW spoke with patient's daughter per her request for assessment. Patient lives with her husband. At baseline, patient drives. Family provides transport when needed. PCP is Dr. Oris Drone. [facility] [place]. Patient was recently set up with [facility], daughter states patient cancelled it because it caused her stress. Daughter states they will reach out to [facility] if they decide they want [facility] again.  Patient has a cane, quad cane, and BSC at home.        No SNF history.  Daughter declined needs at this time. [facility] letter reviewed. TOC to follow for needs prior to DC.   Expected Discharge Plan: Home/Self Care Barriers to Discharge: Continued Medical Work up   Patient Goals and CMS Choice Patient states their goals for this hospitalization and ongoing recovery are:: to return home per daughter CMS Medicare.gov Compare Post Acute Care list provided to:: Patient Choice offered to / list presented to : Patient, Adult Children  Expected Discharge Plan and Services Expected Discharge Plan: Home/Self Care       Living arrangements for the past 2 months: Single Family Home                                      Prior Living Arrangements/Services Living arrangements for the past 2 months: Single Family Home Lives with:: Spouse Patient language and need for interpreter reviewed:: Yes Do you feel safe going back to the place where you live?: Yes      Need for Family Participation in Patient Care: Yes (Comment) Care giver support system in place?: Yes (comment) Current home services: DME Criminal Activity/Legal Involvement Pertinent to Current Situation/Hospitalization: No - Comment as  needed  Activities of Daily Living Home Assistive Devices/Equipment: Cane (specify quad or straight), Eyeglasses, Hearing aid ADL Screening (condition at time of admission) Patient's cognitive ability adequate to safely complete daily activities?: Yes Is the patient deaf or have difficulty hearing?: Yes Does the patient have difficulty seeing, even when wearing glasses/contacts?: No Does the patient have difficulty concentrating, remembering, or making decisions?: Yes Patient able to express need for assistance with ADLs?: Yes Does the patient have difficulty dressing or bathing?: No Independently performs ADLs?: Yes (appropriate for developmental age) Communication: Independent Dressing (OT): Independent Is this a change from baseline?: Pre-admission baseline Feeding: Independent Bathing: Independent Is this a change from baseline?: Pre-admission baseline Toileting: Independent Is this a change from baseline?: Pre-admission baseline In/Out Bed: Independent Is this a change from baseline?: Pre-admission baseline Walks in Home: Independent with device (comment) (cane) Is this a change from baseline?: Pre-admission baseline Does the patient have difficulty walking or climbing stairs?: Yes Weakness of Legs: None Weakness of Arms/Hands: None  Permission Sought/Granted Permission sought to share information with : Facility Art therapist granted to share information with : Yes, Verbal Permission Granted     Permission granted to share info w AGENCY: as needed        Emotional Assessment       Orientation: : Oriented to Self, Oriented to Place, Oriented to  Time, Oriented to Situation Alcohol /  Substance Use: Not Applicable Psych Involvement: No (comment)  Admission diagnosis:  Atrial fibrillation with rapid ventricular response (HCC) [I48.91] Atrial fibrillation with RVR (HCC) [I48.91] Patient Active Problem List   Diagnosis Date Noted   Atrial  fibrillation with RVR ([place]) 04/22/2022   Atrial fibrillation with rapid ventricular response ([place]) 04/21/2022   Demand ischemia 04/21/2022   Pyelonephritis 04/09/2022   Acute pyelonephritis 04/07/2022   Dizziness 04/07/2022   Right arm fracture 04/02/2022   Recurrent falls 04/02/2022   Closed nondisplaced fracture of styloid process of right ulna 04/02/2022   Closed fracture of right distal radius 04/02/2022   Closed nondisplaced fracture of cuboid bone of right foot 04/02/2022   Presence of Watchman left atrial appendage closure device    PAF (paroxysmal atrial fibrillation) ([place]) 01/27/2021   Secondary hypercoagulable state ([place]) 12/30/2020   Hemorrhagic cerebrovascular accident (CVA) ([place]) 07/16/2020   Personal history of colonic polyps    Chronic heart failure with preserved ejection fraction (HFpEF) ([place]) 05/17/2017   Essential hypertension 05/17/2017   Glaucoma (increased eye pressure) 02/06/2017   Invasive ductal carcinoma of breast, female, right ([place]) 10/20/2016   Prolapse of vaginal vault after hysterectomy 08/24/2016   Vaginal atrophy 08/24/2016   Dyspareunia in female 08/24/2016   Colon polyps 01/06/2015   BCC (basal cell carcinoma of skin) 01/06/2015   Iron deficiency anemia 01/06/2015   Hypothyroidism 01/06/2015   Vitamin D deficiency 01/06/2015   Hyperlipidemia LDL goal <70 01/06/2015   Depression, major 01/06/2015   GAD (generalized anxiety disorder) 01/06/2015   RLS (restless legs syndrome) 01/06/2015   Cataract 01/06/2015   Raynaud phenomenon 01/06/2015   GERD (gastroesophageal reflux disease) 01/06/2015   Celiac disease 01/06/2015   OA (osteoarthritis) 01/06/2015   Osteoporosis 01/06/2015   Pre-diabetes 01/06/2015   Avitaminosis D 01/06/2015   Raynaud's syndrome without gangrene 01/06/2015   Age-related osteoporosis without current pathological fracture 01/06/2015   Anxiety disorder 01/05/2015   PCP:  Waylan Rocher, MD Pharmacy:   [facility]  [facility] STORE [place], [address] [place] [address] Phone: [phone] Fax: [phone]  [id number] [place] [id number]: [phone]: [phone]    Social Determinants of Health (SDOH) Interventions    Readmission Risk Interventions     No data to display

## 2022-04-23 NOTE — Care Management Obs Status (Signed)
[place] NOTIFICATION   Patient Details  Name: MAE DENUNZIO MRN: [id number] Date of Birth: 25-Dec-1945   Medicare Observation Status Notification Given:  Yes    Broady Lafoy E Raylyn Speckman, LCSW 04/23/2022, 10:02 AM

## 2022-04-23 NOTE — Discharge Summary (Signed)
[facility] Discharge Summary   Patient: Sandra [name] FFM:[id number]  PCP: Waylan Rocher, MD  Date of admission: 04/21/2022   Date of discharge:  04/23/2022     Discharge Diagnoses:  Principal Problem:   Atrial fibrillation with rapid ventricular response ([place]) Active Problems:   Demand ischemia   Chronic heart failure with preserved ejection fraction (HFpEF) ([place])   Essential hypertension   Atrial fibrillation with RVR ([place])   Admitted From: Home Disposition:  Home   Recommendations for Outpatient Follow-up:  PCP: in 1 wk F/u Cardio in 1 wk Follow up LABS/TEST: Monitor BP and heart rate at home   Diet recommendation: Cardiac diet  Activity: The patient is advised to gradually reintroduce usual activities, as tolerated  Discharge Condition: stable  Code Status: Full code   History of present illness: As per the H and P dictated on admission Hospital Course:  Sandra [name] is a 76 y.o. female with medical history significant of HFpEF with a EF of 60-65%, paroxysmal atrial fibrillation s/p watchman's, hemorrhagic CVA, nonobstructive CAD, celiac disease, hypothyroidism, Raynaud's, prediabetes who presents to the ED with elevated heart rate  Sandra [name] states that yesterday morning, she noticed that she was feeling lightheaded and was experiencing generalized weakness.  Symptoms are particularly worse when going from a sitting to a standing position. Her symptoms came and went throughout the day persistently, however she did not want to go to the ER at that time.  Today, she went to her local pharmacy and asked them to check her blood pressure and heart rate.  They told her her blood pressure was very low and her heart rate was very high and EMS was called. Sandra [name] denies any other acute complaints at this time including chest pain, shortness of breath, lower extremity edema.  She has been eating and drinking normally.  On EMS arrival, EKG was consistent with  atrial fibrillation with heart rate ranging between 101 152.  Her blood pressure was 147/82.  On arrival to the ED, patient was hypertensive at 149/81 with heart rate of 140.  She was saturating at 97% on room air.  BNP elevated at 691, similar to prior BMP obtained approximately 2 weeks ago.  Troponin elevated at 53.  D-dimer elevated; CTA was obtained and negative for PE.  Chest x-ray with some vascular congestion but not seen on CTA.  Patient was started on diltiazem infusion and [facility] contacted for admission.   Assessment and Plan:  # Paroxysmal atrial fibrillation with RVR ([place]) Patient presenting with A-fib with RVR previously well-controlled with metoprolol 12.5 mg daily.  She has been responding well to diltiazem drip; I suspect on discharge, she will either require higher dose of metoprolol or a combination of metoprolol and diltiazem.   cardiology consulted for antiarrhythmics.  Started p.o. Cardizem, repeat TTE, amiodarone not ideal with thyroid dysfunction, Outpatient EP follow-up for antiarrhythmic consideration.  Patient was started on Cardizem 30 mg p.o. every 6 hourly, consolidated to Cardizem CD 180 mg p.o. daily as per cardiology, discontinued amlodipine.  Resumed home dose metoprolol.  Patient ambulated in the hallway remained asymptomatic and cleared by cardiology to discharge home and follow-up as an outpatient.  TTE shows LVEF 60 to 65%, mild LV hypertrophy, diastolic parameters are indeterminate.  Left atrium moderately dilated. # Demand ischemia, In the setting of significant RVR.  No chest pain or EKG changes concerning for acute ischemia. Trop 53--44 trended drawn and remained flat # Chronic heart failure with  preserved ejection fraction (HFpEF)  Patient is euvolemic on examination although she does have mild Rales.  BNP is comparable to what it was 2 weeks ago.  However specific gravity of urine significantly elevated. S/p gentle hydration 500 cc for 6 hrs given, BNP 691 #  Essential hypertension, home dose Lopressor, started Cardizem CD 180 mg p.o. OD, discontinued amlodipine.  Patient was advised to monitor BP and heart rate at home and follow with PCP and cardiology.  Body mass index is 21.68 kg/m.  Nutrition Interventions:   Patient was ambulatory without any assistance. On the day of the discharge the patient's vitals were stable, and no other acute medical condition were reported by patient. the patient was felt safe to be discharge at Home.  Consultants: Cardiology Procedures: None  Discharge Exam: General: Appear in no distress, no Rash; Oral Mucosa Clear, moist. Cardiovascular: A-fib, regular rhythm, no Murmur, Respiratory: normal respiratory effort, Bilateral Air entry present and no Crackles, no wheezes Abdomen: Bowel Sound present, Soft and no tenderness, no hernia Extremities: no Pedal edema, no calf tenderness Neurology: alert and oriented to time, place, and person affect appropriate.  Filed Weights   04/22/22 1440 04/23/22 0500  Weight: 60.3 kg 55.5 kg   Vitals:   04/23/22 0828 04/23/22 1243  BP: 137/67 125/78  Pulse: 62 (!) 108  Resp: 16   Temp: 97.6 F (36.4 C) 97.7 F (36.5 C)  SpO2: 95% 97%    DISCHARGE MEDICATION: Allergies as of 04/23/2022       Reactions   Tape Rash        Medication List     STOP taking these medications    amLODipine 10 MG tablet Commonly known as: NORVASC       TAKE these medications    acetaminophen 325 MG tablet Commonly known as: TYLENOL Take 2 tablets (650 mg total) by mouth every 6 (six) hours as needed for mild pain (or Fever >/= 101).   ALPRAZolam 0.5 MG tablet Commonly known as: XANAX Take 1 tablet (0.5 mg total) by mouth at bedtime as needed. for sleep What changed:  how much to take reasons to take this   aspirin EC 81 MG tablet Take 1 tablet (81 mg total) by mouth daily. Swallow whole.   atorvastatin 40 MG tablet Commonly known as: LIPITOR TAKE 1 TABLET BY  MOUTH DAILY   bisacodyl 5 MG EC tablet Generic drug: bisacodyl Take 5 mg by mouth daily as needed for moderate constipation.   CALCIUM 600 + D PO Take 1 tablet by mouth daily.   diltiazem 180 MG 24 hr capsule Commonly known as: CARDIZEM CD Take 1 capsule (180 mg total) by mouth daily. Start taking on: April 24, 2022   docusate sodium 100 MG capsule Commonly known as: COLACE Take 100 mg by mouth daily as needed for mild constipation.   HYDROcodone-acetaminophen 5-325 MG tablet Commonly known as: NORCO/VICODIN Take by mouth.   hydroxypropyl methylcellulose / hypromellose 2.5 % ophthalmic solution Commonly known as: ISOPTO TEARS / GONIOVISC Place 1 drop into both eyes 4 (four) times daily as needed for dry eyes.   iron polysaccharides 150 MG capsule Commonly known as: NIFEREX Take 1 capsule (150 mg total) by mouth daily. Or can take any over-the-counter iron supplement.   latanoprost 0.005 % ophthalmic solution Commonly known as: XALATAN Place 1 drop into both eyes at bedtime.   levothyroxine 100 MCG tablet Commonly known as: SYNTHROID TAKE 1 TABLET BY MOUTH 30  MINUTES BEFORE BREAKFAST  What changed: See the new instructions.   metoprolol succinate 25 MG 24 hr tablet Commonly known as: TOPROL-XL Take 0.5 tablets (12.5 mg total) by mouth daily. May take additional 12.5 mg for A Fib.  Home med.   multivitamin with minerals Tabs tablet Take 1 tablet by mouth daily at 3 pm.   omeprazole 40 MG capsule Commonly known as: PRILOSEC TAKE 1 CAPSULE BY MOUTH DAILY   polyethylene glycol 17 g packet Commonly known as: MIRALAX / GLYCOLAX Take 17 g by mouth daily as needed for moderate constipation.   potassium chloride 20 MEQ packet Commonly known as: KLOR-CON Take 20 mEq by mouth daily.   potassium chloride SA 20 MEQ tablet Commonly known as: KLOR-CON M Take 1 tablet (20 mEq total) by mouth daily for 7 days.   sertraline 100 MG tablet Commonly known as: ZOLOFT TAKE  1 TABLET BY MOUTH DAILY   timolol 0.5 % ophthalmic solution Commonly known as: BETIMOL Place 1 drop into both eyes 2 (two) times daily.   Vitamin D3 125 MCG (5000 UT) Tabs Take 5,000 Units by mouth daily.       Allergies  Allergen Reactions   Tape Rash   Discharge Instructions     Call MD for:   Complete by: As directed    Palpitations or racing of heart, and chest pain   Call MD for:  difficulty breathing, headache or visual disturbances   Complete by: As directed    Call MD for:  extreme fatigue   Complete by: As directed    Call MD for:  persistant dizziness or light-headedness   Complete by: As directed    Call MD for:  severe uncontrolled pain   Complete by: As directed    Diet - low sodium heart healthy   Complete by: As directed    Discharge instructions   Complete by: As directed    Follow-up with PCP in 1 week, continue to monitor BP and heart rate at home Follow-up with cardiology in 1 week.   Increase activity slowly   Complete by: As directed        The results of significant diagnostics from this hospitalization (including imaging, microbiology, ancillary and laboratory) are listed below for reference.    Significant Diagnostic Studies: ECHOCARDIOGRAM COMPLETE  Result Date: 04/22/2022    ECHOCARDIOGRAM REPORT   Patient Name:   FELMA PFEFFERLE Date of Exam: 04/22/2022 Medical Rec #:  [id number]      Height:       63.0 in Accession #:    [id number]     Weight:       132.3 lb Date of Birth:  [birth year]     BSA:          1.622 m Patient Age:    76 years       BP:           113/65 mmHg Patient Gender: F              HR:           95 bpm. Exam Location:  [facility] Procedure: 2D Echo and Intracardiac Opacification Agent Indications:     Atrial Fibrillation I48.91  History:         Patient has prior history of Echocardiogram examinations, most                  recent 03/10/2021.  Sonographer:     Kathlen Brunswick RDCS Referring Phys:  [id number] Kate Sable  Diagnosing  Phys: Kate Sable MD  Sonographer Comments: Technically difficult study due to poor echo windows and suboptimal apical window. Image acquisition challenging due to respiratory motion. IMPRESSIONS  1. Left ventricular ejection fraction, by estimation, is 60 to 65%. The left ventricle has normal function. The left ventricle has no regional wall motion abnormalities. There is mild left ventricular hypertrophy. Left ventricular diastolic parameters are indeterminate.  2. Right ventricular systolic function is normal. The right ventricular size is normal.  3. Left atrial size was moderately dilated.  4. The mitral valve is normal in structure. Mild mitral valve regurgitation.  5. The aortic valve is tricuspid. Aortic valve regurgitation is not visualized.  6. The inferior vena cava is normal in size with greater than 50% respiratory variability, suggesting right atrial pressure of 3 mmHg. FINDINGS  Left Ventricle: Left ventricular ejection fraction, by estimation, is 60 to 65%. The left ventricle has normal function. The left ventricle has no regional wall motion abnormalities. The left ventricular internal cavity size was normal in size. There is  mild left ventricular hypertrophy. Left ventricular diastolic parameters are indeterminate. Right Ventricle: The right ventricular size is normal. No increase in right ventricular wall thickness. Right ventricular systolic function is normal. Left Atrium: Left atrial size was moderately dilated. Right Atrium: Right atrial size was normal in size. Pericardium: There is no evidence of pericardial effusion. Mitral Valve: The mitral valve is normal in structure. Mild mitral valve regurgitation. Tricuspid Valve: The tricuspid valve is normal in structure. Tricuspid valve regurgitation is mild. Aortic Valve: The aortic valve is tricuspid. Aortic valve regurgitation is not visualized. Aortic valve peak gradient measures 8.3 mmHg. Pulmonic Valve: The pulmonic valve was normal in  structure. Pulmonic valve regurgitation is trivial. Aorta: The aortic root and ascending aorta are structurally normal, with no evidence of dilitation. Venous: The inferior vena cava is normal in size with greater than 50% respiratory variability, suggesting right atrial pressure of 3 mmHg. IAS/Shunts: No atrial level shunt detected by color flow Doppler.  LEFT VENTRICLE PLAX 2D LVIDd:         3.10 cm     Diastology LVIDs:         2.00 cm     LV e' medial:    8.05 cm/s LV PW:         1.50 cm     LV E/e' medial:  8.6 LV IVS:        1.60 cm     LV e' lateral:   12.70 cm/s LVOT diam:     1.80 cm     LV E/e' lateral: 5.4 LV SV:         42 LV SV Index:   26 LVOT Area:     2.54 cm  LV Volumes (MOD) LV vol d, MOD A2C: 75.4 ml LV vol d, MOD A4C: 37.2 ml LV vol s, MOD A2C: 21.1 ml LV vol s, MOD A4C: 14.9 ml LV SV MOD A2C:     54.3 ml LV SV MOD A4C:     37.2 ml LV SV MOD BP:      38.2 ml RIGHT VENTRICLE RV Basal diam:  2.50 cm RV S prime:     11.60 cm/s TAPSE (M-mode): 1.6 cm LEFT ATRIUM             Index        RIGHT ATRIUM           Index LA diam:  3.70 cm 2.28 cm/m   RA Area:     11.10 cm LA Vol (A2C):   49.0 ml 30.21 ml/m  RA Volume:   22.20 ml  13.69 ml/m LA Vol (A4C):   75.3 ml 46.42 ml/m LA Biplane Vol: 65.2 ml 40.20 ml/m  AORTIC VALVE                 PULMONIC VALVE AV Area (Vmax): 1.71 cm     PV Vmax:          1.38 m/s AV Vmax:        144.00 cm/s  PV Peak grad:     7.6 mmHg AV Peak Grad:   8.3 mmHg     PR End Diast Vel: 3.96 msec LVOT Vmax:      96.60 cm/s LVOT Vmean:     66.900 cm/s LVOT VTI:       0.165 m  AORTA Ao Root diam: 2.90 cm Ao Asc diam:  2.90 cm MITRAL VALVE               TRICUSPID VALVE MV Area (PHT): 3.00 cm    TR Peak grad:   26.0 mmHg MV Decel Time: 253 msec    TR Vmax:        255.00 cm/s MV E velocity: 69.20 cm/s                            SHUNTS                            Systemic VTI:  0.16 m                            Systemic Diam: 1.80 cm Kate Sable MD Electronically signed  by Kate Sable MD Signature Date/Time: 04/22/2022/4:15:08 PM    Final    CT Angio Chest PE W and/or Wo Contrast  Result Date: 04/21/2022 CLINICAL DATA:  76 year old female presenting for evaluation of suspected pulmonary embolism, history of pulmonary nodules and history of breast cancer. * Tracking Code: BO * EXAM: CT ANGIOGRAPHY CHEST WITH CONTRAST TECHNIQUE: Multidetector CT imaging of the chest was performed using the standard protocol during bolus administration of intravenous contrast. Multiplanar CT image reconstructions and MIPs were obtained to evaluate the vascular anatomy. RADIATION DOSE REDUCTION: This exam was performed according to the departmental dose-optimization program which includes automated exposure control, adjustment of the mA and/or kV according to patient size and/or use of iterative reconstruction technique. CONTRAST:  86m OMNIPAQUE IOHEXOL 350 MG/ML SOLN COMPARISON:  Previous imaging from July 25, 2021. FINDINGS: Cardiovascular: Calcified aortic atherosclerotic changes. Watchman placement in the LEFT atrial appendage. Heart size top normal. No pericardial effusion or sign of pericardial nodularity. Central pulmonary arteries are opacified to 533 Hounsfield units. This study is negative for pulmonary embolism. Mediastinum/Nodes: No thoracic inlet, axillary, mediastinal or hilar adenopathy. Esophagus grossly normal. Lungs/Pleura: Mild mosaic attenuation throughout the chest similar to previous imaging. Pulmonary nodule in the RIGHT middle lobe along the major fissure (image 51/5) 6 mm, unchanged compared to imaging from February. RIGHT middle lobe nodule (image 156/6) 5 mm, unchanged compared to February imaging. No new pulmonary nodules. Stable LEFT lower lobe nodule at 4 mm (image 73/5) also stable LEFT lower lobe nodule at 3 mm (image 68/5) airways are patent. No pneumothorax. No sign of pleural effusion. Upper Abdomen:  Reflux of contrast into hepatic veins and IVC. No  acute process in the upper abdomen to the extent evaluated. Musculoskeletal: No acute bone finding. No destructive bone process. Spinal degenerative changes. Review of the MIP images confirms the above findings. IMPRESSION: 1. Negative for pulmonary embolism. 2. Mild mosaic attenuation throughout the chest similar to previous imaging, can be seen in the setting of small airways disease. 3. Stable bilateral pulmonary nodules, unchanged compared to imaging from February imaging. Also unchanged compared to imaging dating back to May of 2021 in this patient with history of breast cancer. 4. Reflux of contrast into hepatic veins and IVC, can be seen in the setting of right heart dysfunction. 5. Aortic atherosclerosis. Aortic Atherosclerosis (ICD10-I70.0). Electronically Signed   By: Zetta Bills M.D.   On: 04/21/2022 15:56   DG Chest Port 1 View  Result Date: 04/21/2022 CLINICAL DATA:  Atrial fibrillation, hypertension EXAM: PORTABLE CHEST 1 VIEW COMPARISON:  Portable exam 1450 hours compared to 04/07/2022 FINDINGS: Enlargement of cardiac silhouette with pulmonary vascular congestion. Atherosclerotic calcification aorta. Lungs clear. No pulmonary infiltrate, pleural effusion, or pneumothorax. Mild osseous demineralization. IMPRESSION: Enlargement of cardiac silhouette with pulmonary vascular congestion. No acute abnormalities. Aortic Atherosclerosis (ICD10-I70.0). Electronically Signed   By: Lavonia [name] M.D.   On: 04/21/2022 14:59   DG Chest Port 1 View  Result Date: 04/07/2022 CLINICAL DATA:  Fever and possible UTI. History of breast cancer. EXAM: PORTABLE CHEST 1 VIEW COMPARISON:  December 30, 2020 FINDINGS: Electronic apparatus overlie the superior heart border. Cardiomediastinal silhouette is normal. Mediastinal contours appear intact. Calcific atherosclerotic disease and tortuosity of the aorta. There is no evidence of focal airspace consolidation, pleural effusion or pneumothorax. Mild coarsening of  interstitial markings. Osseous structures are without acute abnormality. Soft tissues are grossly normal. IMPRESSION: 1. Mild coarsening of the interstitial markings, which may be seen with pulmonary vascular congestion. 2. No definite focal airspace consolidation. Electronically Signed   By: Fidela Salisbury M.D.   On: 04/07/2022 13:04   CT Renal Stone Study  Result Date: 04/07/2022 CLINICAL DATA:  Flank pain. EXAM: CT ABDOMEN AND PELVIS WITHOUT CONTRAST TECHNIQUE: Multidetector CT imaging of the abdomen and pelvis was performed following the standard protocol without IV contrast. RADIATION DOSE REDUCTION: This exam was performed according to the departmental dose-optimization program which includes automated exposure control, adjustment of the mA and/or kV according to patient size and/or use of iterative reconstruction technique. COMPARISON:  November 26, 2019 FINDINGS: Lower chest: No acute abnormality. Hepatobiliary: No focal liver abnormality is seen. No gallstones, gallbladder wall thickening, or biliary dilatation. Pancreas: Unremarkable. No pancreatic ductal dilatation or surrounding inflammatory changes. Spleen: Normal in size without focal abnormality. Adrenals/Urinary Tract: Normal adrenal glands. Bilateral nonobstructive renal calculi. 2 mm nonobstructive calculus in the lower pole the right kidney. Four nonobstructive calculi in the left kidney with the largest measuring 4 mm in the lower pole. Stomach/Bowel: Stomach is within normal limits. Appendix appears normal. No evidence of bowel wall thickening, distention, or inflammatory changes. Vascular/Lymphatic: Aortic atherosclerosis. No enlarged abdominal or pelvic lymph nodes. Reproductive: Status post hysterectomy. No adnexal masses. Other: Focal mesenteric stranding with a few sub pathologic by CT criteria lymph nodes in the left central abdomen. The findings are best appreciated on the coronal view, image 23-40 7/92. Musculoskeletal: Spondylosis  of the lower lumbosacral spine. IMPRESSION: 1. Bilateral nonobstructive renal calculi. 2. Focal mesenteric stranding with a few interspersed sub pathologic by CT criteria lymph nodes in the left central abdomen. This finding  is nonspecific, but may represent mesenteric lymphadenitis or panniculitis. 3. Aortic atherosclerosis. Aortic Atherosclerosis (ICD10-I70.0). Electronically Signed   By: Fidela Salisbury M.D.   On: 04/07/2022 11:25   CT ANKLE RIGHT WO CONTRAST  Result Date: 04/02/2022 CLINICAL DATA:  Right ankle pain after fall 2 days ago EXAM: CT OF THE RIGHT ANKLE WITHOUT CONTRAST TECHNIQUE: Multidetector CT imaging of the right ankle was performed according to the standard protocol. Multiplanar CT image reconstructions were also generated. RADIATION DOSE REDUCTION: This exam was performed according to the departmental dose-optimization program which includes automated exposure control, adjustment of the mA and/or kV according to patient size and/or use of iterative reconstruction technique. COMPARISON:  None Available. FINDINGS: Bones/Joint/Cartilage No acute fracture. No dislocation. Ankle mortise is congruent. Degenerative subchondral cystic changes at the medial greater than lateral talar shoulders. No tibiotalar joint effusion. No intra-articular loose body. Mild joint space narrowing of the subtalar joints and within the midfoot. Ligaments Suboptimally assessed by CT. Muscles and Tendons No acute musculotendinous abnormality by CT. Soft tissues Mild soft tissue swelling, most pronounced at the lateral aspect of the ankle. IMPRESSION: 1. No acute fracture or dislocation of the right ankle. 2. Mild soft tissue swelling, most pronounced at the lateral aspect of the ankle. Electronically Signed   By: Davina Poke D.O.   On: 04/02/2022 10:01   CT HEAD WO CONTRAST (5MM)  Result Date: 04/02/2022 CLINICAL DATA:  Dizziness, persistent or recurrent EXAM: CT HEAD WITHOUT CONTRAST TECHNIQUE: Contiguous  axial images were obtained from the base of the skull through the vertex without intravenous contrast. RADIATION DOSE REDUCTION: This exam was performed according to the departmental dose-optimization program which includes automated exposure control, adjustment of the mA and/or kV according to patient size and/or use of iterative reconstruction technique. COMPARISON:  10/20/2020 FINDINGS: Brain: No evidence of acute infarction, hemorrhage, hydrocephalus, extra-axial collection or mass lesion/mass effect. Chronic small vessel disease with unchanged low-density in the deep white matter. Stable and age normal brain volume. Vascular: No hyperdense vessel or unexpected calcification. Skull: Normal. Negative for fracture or focal lesion. Sinuses/Orbits: No acute finding. IMPRESSION: No acute finding, stable from 2022. Electronically Signed   By: Jorje Guild M.D.   On: 04/02/2022 09:52    Microbiology: No results found for this or any previous visit (from the past 240 hour(s)).   Labs: CBC: Recent Labs  Lab 04/21/22 1436 04/22/22 0624 04/23/22 0446  WBC 8.1 5.5 4.9  HGB 12.0 11.9* 11.3*  HCT 38.6 37.9 35.8*  MCV 86.5 86.5 86.1  PLT 433* 385 185   Basic Metabolic Panel: Recent Labs  Lab 04/21/22 1436 04/22/22 0624 04/23/22 0446  NA 140 142 142  K 3.9 4.2 3.8  CL 106 111 109  CO2 25 23 24   GLUCOSE 88 123* 112*  BUN 20 15 14   CREATININE 0.73 0.48 0.64  CALCIUM 9.3 9.0 8.8*  MG 2.2  --  2.0  PHOS  --   --  3.8   Liver Function Tests: No results for input(s): "AST", "ALT", "ALKPHOS", "BILITOT", "PROT", "ALBUMIN" in the last 168 hours. No results for input(s): "LIPASE", "AMYLASE" in the last 168 hours. No results for input(s): "AMMONIA" in the last 168 hours. Cardiac Enzymes: No results for input(s): "CKTOTAL", "CKMB", "CKMBINDEX", "TROPONINI" in the last 168 hours. BNP (last 3 results) Recent Labs    04/07/22 1411 04/21/22 1436  BNP 625.3* 691.3*   CBG: No results for  input(s): "GLUCAP" in the last 168 hours.  Time spent:  35 minutes  Signed:  Val Riles  [facility] 04/23/2022 2:08 PM

## 2022-04-23 NOTE — Progress Notes (Signed)
Rounding Note    Patient Name: Sandra [name] Date of Encounter: 04/23/2022  [place] Cardiologist: Nelva Bush, MD   Subjective   Doing okay, denies palpitations, heart rates better.  Inpatient Medications    Scheduled Meds:  aspirin EC  81 mg Oral Daily   atorvastatin  40 mg Oral Daily   diltiazem  180 mg Oral Daily   enoxaparin (LOVENOX) injection  40 mg Subcutaneous Q24H   influenza vaccine adjuvanted  0.5 mL Intramuscular Tomorrow-1000   iron polysaccharides  150 mg Oral Daily   latanoprost  1 drop Both Eyes QHS   levothyroxine  100 mcg Oral QAC breakfast   metoprolol succinate  12.5 mg Oral Daily   multivitamin with minerals  1 tablet Oral Daily   pantoprazole  40 mg Oral Daily   sertraline  100 mg Oral Daily   sodium chloride flush  3 mL Intravenous Q12H   timolol  1 drop Both Eyes BID   Continuous Infusions:  PRN Meds: acetaminophen **OR** acetaminophen, ALPRAZolam, bisacodyl, famotidine, HYDROcodone-acetaminophen, ondansetron **OR** ondansetron (ZOFRAN) IV, polyethylene glycol   Vital Signs    Vitals:   04/23/22 0411 04/23/22 0500 04/23/22 0828 04/23/22 1243  BP: 124/82  137/67 125/78  Pulse: 87  62 (!) 108  Resp: 17  16   Temp: 98.3 F (36.8 C)  97.6 F (36.4 C) 97.7 F (36.5 C)  TempSrc: Oral     SpO2: 96%  95% 97%  Weight:  55.5 kg    Height:        Intake/Output Summary (Last 24 hours) at 04/23/2022 1347 Last data filed at 04/22/2022 1822 Gross per 24 hour  Intake 368.3 ml  Output 150 ml  Net 218.3 ml      04/23/2022    5:00 AM 04/22/2022    2:40 PM 04/11/2022   11:16 AM  Last 3 Weights  Weight (lbs) 122 lb 5.7 oz 132 lb 15 oz 132 lb 4.4 oz  Weight (kg) 55.5 kg 60.3 kg 60 kg      Telemetry    Atrial fibrillation, heart rate 91- Personally Reviewed  ECG     - Personally Reviewed  Physical Exam   GEN: No acute distress.   Neck: No JVD Cardiac: Irregular irregular Respiratory: Clear to auscultation  bilaterally. GI: Soft, nontender, non-distended  MS: No edema; No deformity. Neuro:  Nonfocal  Psych: Normal affect   Labs    High Sensitivity Troponin:   Recent Labs  Lab 04/21/22 1436 04/21/22 1824  TROPONINIHS 53* 44*     Chemistry Recent Labs  Lab 04/21/22 1436 04/22/22 0624 04/23/22 0446  NA 140 142 142  K 3.9 4.2 3.8  CL 106 111 109  CO2 25 23 24   GLUCOSE 88 123* 112*  BUN 20 15 14   CREATININE 0.73 0.48 0.64  CALCIUM 9.3 9.0 8.8*  MG 2.2  --  2.0  GFRNONAA >60 >60 >60  ANIONGAP 9 8 9     Lipids No results for input(s): "CHOL", "TRIG", "HDL", "LABVLDL", "LDLCALC", "CHOLHDL" in the last 168 hours.  Hematology Recent Labs  Lab 04/21/22 1436 04/22/22 0624 04/23/22 0446  WBC 8.1 5.5 4.9  RBC 4.46 4.38 4.16  HGB 12.0 11.9* 11.3*  HCT 38.6 37.9 35.8*  MCV 86.5 86.5 86.1  MCH 26.9 27.2 27.2  MCHC 31.1 31.4 31.6  RDW 15.7* 16.1* 15.8*  PLT 433* 385 349   Thyroid  Recent Labs  Lab 04/21/22 1436  TSH 3.815  FREET4  1.16*    BNP Recent Labs  Lab 04/21/22 1436  BNP 691.3*    DDimer  Recent Labs  Lab 04/21/22 1436  DDIMER 1.68*     Radiology    ECHOCARDIOGRAM COMPLETE  Result Date: 04/22/2022    ECHOCARDIOGRAM REPORT   Patient Name:   Sandra [name] Date of Exam: 04/22/2022 Medical Rec #:  [id number]      Height:       63.0 in Accession #:    [id number]     Weight:       132.3 lb Date of Birth:  November 29, 1945     BSA:          1.622 m Patient Age:    76 years       BP:           113/65 mmHg Patient Gender: F              HR:           95 bpm. Exam Location:  [facility] Procedure: 2D Echo and Intracardiac Opacification Agent Indications:     Atrial Fibrillation I48.91  History:         Patient has prior history of Echocardiogram examinations, most                  recent 03/10/2021.  Sonographer:     Kathlen Brunswick RDCS Referring Phys:  [id number] Kate Sable Diagnosing Phys: Kate Sable MD  Sonographer Comments: Technically difficult study due to  poor echo windows and suboptimal apical window. Image acquisition challenging due to respiratory motion. IMPRESSIONS  1. Left ventricular ejection fraction, by estimation, is 60 to 65%. The left ventricle has normal function. The left ventricle has no regional wall motion abnormalities. There is mild left ventricular hypertrophy. Left ventricular diastolic parameters are indeterminate.  2. Right ventricular systolic function is normal. The right ventricular size is normal.  3. Left atrial size was moderately dilated.  4. The mitral valve is normal in structure. Mild mitral valve regurgitation.  5. The aortic valve is tricuspid. Aortic valve regurgitation is not visualized.  6. The inferior vena cava is normal in size with greater than 50% respiratory variability, suggesting right atrial pressure of 3 mmHg. FINDINGS  Left Ventricle: Left ventricular ejection fraction, by estimation, is 60 to 65%. The left ventricle has normal function. The left ventricle has no regional wall motion abnormalities. The left ventricular internal cavity size was normal in size. There is  mild left ventricular hypertrophy. Left ventricular diastolic parameters are indeterminate. Right Ventricle: The right ventricular size is normal. No increase in right ventricular wall thickness. Right ventricular systolic function is normal. Left Atrium: Left atrial size was moderately dilated. Right Atrium: Right atrial size was normal in size. Pericardium: There is no evidence of pericardial effusion. Mitral Valve: The mitral valve is normal in structure. Mild mitral valve regurgitation. Tricuspid Valve: The tricuspid valve is normal in structure. Tricuspid valve regurgitation is mild. Aortic Valve: The aortic valve is tricuspid. Aortic valve regurgitation is not visualized. Aortic valve peak gradient measures 8.3 mmHg. Pulmonic Valve: The pulmonic valve was normal in structure. Pulmonic valve regurgitation is trivial. Aorta: The aortic root and  ascending aorta are structurally normal, with no evidence of dilitation. Venous: The inferior vena cava is normal in size with greater than 50% respiratory variability, suggesting right atrial pressure of 3 mmHg. IAS/Shunts: No atrial level shunt detected by color flow Doppler.  LEFT VENTRICLE PLAX 2D LVIDd:  3.10 cm     Diastology LVIDs:         2.00 cm     LV e' medial:    8.05 cm/s LV PW:         1.50 cm     LV E/e' medial:  8.6 LV IVS:        1.60 cm     LV e' lateral:   12.70 cm/s LVOT diam:     1.80 cm     LV E/e' lateral: 5.4 LV SV:         42 LV SV Index:   26 LVOT Area:     2.54 cm  LV Volumes (MOD) LV vol d, MOD A2C: 75.4 ml LV vol d, MOD A4C: 37.2 ml LV vol s, MOD A2C: 21.1 ml LV vol s, MOD A4C: 14.9 ml LV SV MOD A2C:     54.3 ml LV SV MOD A4C:     37.2 ml LV SV MOD BP:      38.2 ml RIGHT VENTRICLE RV Basal diam:  2.50 cm RV S prime:     11.60 cm/s TAPSE (M-mode): 1.6 cm LEFT ATRIUM             Index        RIGHT ATRIUM           Index LA diam:        3.70 cm 2.28 cm/m   RA Area:     11.10 cm LA Vol (A2C):   49.0 ml 30.21 ml/m  RA Volume:   22.20 ml  13.69 ml/m LA Vol (A4C):   75.3 ml 46.42 ml/m LA Biplane Vol: 65.2 ml 40.20 ml/m  AORTIC VALVE                 PULMONIC VALVE AV Area (Vmax): 1.71 cm     PV Vmax:          1.38 m/s AV Vmax:        144.00 cm/s  PV Peak grad:     7.6 mmHg AV Peak Grad:   8.3 mmHg     PR End Diast Vel: 3.96 msec LVOT Vmax:      96.60 cm/s LVOT Vmean:     66.900 cm/s LVOT VTI:       0.165 m  AORTA Ao Root diam: 2.90 cm Ao Asc diam:  2.90 cm MITRAL VALVE               TRICUSPID VALVE MV Area (PHT): 3.00 cm    TR Peak grad:   26.0 mmHg MV Decel Time: 253 msec    TR Vmax:        255.00 cm/s MV E velocity: 69.20 cm/s                            SHUNTS                            Systemic VTI:  0.16 m                            Systemic Diam: 1.80 cm Kate Sable MD Electronically signed by Kate Sable MD Signature Date/Time: 04/22/2022/4:15:08 PM    Final     CT Angio Chest PE W and/or Wo Contrast  Result Date: 04/21/2022 CLINICAL DATA:  76 year old female presenting for  evaluation of suspected pulmonary embolism, history of pulmonary nodules and history of breast cancer. * Tracking Code: BO * EXAM: CT ANGIOGRAPHY CHEST WITH CONTRAST TECHNIQUE: Multidetector CT imaging of the chest was performed using the standard protocol during bolus administration of intravenous contrast. Multiplanar CT image reconstructions and MIPs were obtained to evaluate the vascular anatomy. RADIATION DOSE REDUCTION: This exam was performed according to the departmental dose-optimization program which includes automated exposure control, adjustment of the mA and/or kV according to patient size and/or use of iterative reconstruction technique. CONTRAST:  87m OMNIPAQUE IOHEXOL 350 MG/ML SOLN COMPARISON:  Previous imaging from July 25, 2021. FINDINGS: Cardiovascular: Calcified aortic atherosclerotic changes. Watchman placement in the LEFT atrial appendage. Heart size top normal. No pericardial effusion or sign of pericardial nodularity. Central pulmonary arteries are opacified to 533 Hounsfield units. This study is negative for pulmonary embolism. Mediastinum/Nodes: No thoracic inlet, axillary, mediastinal or hilar adenopathy. Esophagus grossly normal. Lungs/Pleura: Mild mosaic attenuation throughout the chest similar to previous imaging. Pulmonary nodule in the RIGHT middle lobe along the major fissure (image 51/5) 6 mm, unchanged compared to imaging from February. RIGHT middle lobe nodule (image 156/6) 5 mm, unchanged compared to February imaging. No new pulmonary nodules. Stable LEFT lower lobe nodule at 4 mm (image 73/5) also stable LEFT lower lobe nodule at 3 mm (image 68/5) airways are patent. No pneumothorax. No sign of pleural effusion. Upper Abdomen: Reflux of contrast into hepatic veins and IVC. No acute process in the upper abdomen to the extent evaluated. Musculoskeletal: No  acute bone finding. No destructive bone process. Spinal degenerative changes. Review of the MIP images confirms the above findings. IMPRESSION: 1. Negative for pulmonary embolism. 2. Mild mosaic attenuation throughout the chest similar to previous imaging, can be seen in the setting of small airways disease. 3. Stable bilateral pulmonary nodules, unchanged compared to imaging from February imaging. Also unchanged compared to imaging dating back to May of 2021 in this patient with history of breast cancer. 4. Reflux of contrast into hepatic veins and IVC, can be seen in the setting of right heart dysfunction. 5. Aortic atherosclerosis. Aortic Atherosclerosis (ICD10-I70.0). Electronically Signed   By: GZetta BillsM.D.   On: 04/21/2022 15:56   DG Chest Port 1 View  Result Date: 04/21/2022 CLINICAL DATA:  Atrial fibrillation, hypertension EXAM: PORTABLE CHEST 1 VIEW COMPARISON:  Portable exam 1450 hours compared to 04/07/2022 FINDINGS: Enlargement of cardiac silhouette with pulmonary vascular congestion. Atherosclerotic calcification aorta. Lungs clear. No pulmonary infiltrate, pleural effusion, or pneumothorax. Mild osseous demineralization. IMPRESSION: Enlargement of cardiac silhouette with pulmonary vascular congestion. No acute abnormalities. Aortic Atherosclerosis (ICD10-I70.0). Electronically Signed   By: MLavonia DanaM.D.   On: 04/21/2022 14:59    Cardiac Studies   Echo 04/23/2022  1. Left ventricular ejection fraction, by estimation, is 60 to 65%. The  left ventricle has normal function. The left ventricle has no regional  wall motion abnormalities. There is mild left ventricular hypertrophy.  Left ventricular diastolic parameters  are indeterminate.   2. Right ventricular systolic function is normal. The right ventricular  size is normal.   3. Left atrial size was moderately dilated.   4. The mitral valve is normal in structure. Mild mitral valve  regurgitation.   5. The aortic valve is  tricuspid. Aortic valve regurgitation is not  visualized.   6. The inferior vena cava is normal in size with greater than 50%  respiratory variability, suggesting right atrial pressure of 3 mmHg.   Patient  Profile     76 y.o. female with history of paroxysmal atrial fibrillation, frequent falls, hypothyroidism, intracranial hemorrhage, s/p watchman 01/2021 presenting with dizziness, hypertension, being seen for A-fib RVR  Assessment & Plan    Atrial fibrillation, watchman 01/2019 20 -Heart rate better controlled, symptoms controlled -Consolidate Cardizem to CD 180 mg daily -Continue Toprol-XL 12.5 mg daily -Not on anticoagulation due to frequent falls, intracranial hemorrhage -Aspirin 81 mg weekly -Follow-up with EP as outpatient for antiarrhythmic consideration.  2.  Hypertension -BP controlled -Stop Norvasc with starting Cardizem Cont toprol xl  Total encounter time more than 70 minutes  Greater than 50% was spent in counseling and coordination of care with the patient     Signed, Kate Sable, MD  04/23/2022, 1:47 PM

## 2022-04-24 ENCOUNTER — Telehealth: Payer: Self-pay | Admitting: Cardiology

## 2022-04-24 NOTE — Telephone Encounter (Signed)
LVM to schedule follow up with Dr. [name]

## 2022-04-24 NOTE — Telephone Encounter (Signed)
-----   Message from Kate Sable, MD sent at 04/23/2022 11:49 AM EST ----- Please schedule f/u appointment with Dr. [name].

## 2022-04-25 ENCOUNTER — Telehealth: Payer: Self-pay | Admitting: Cardiology

## 2022-04-25 NOTE — Telephone Encounter (Signed)
Patient is scheduled for 12/20 with Dr. [name], she is asking what there parameters with her BP she should look for before she calls in . She states her medication has been changed- Amlodipine and started her on Cardizem when she was in patient,  and potassium started by her PCP. She also would like to know if December 20 is ok to wait to see Dr. [name]

## 2022-04-25 NOTE — Telephone Encounter (Signed)
Pt is returning call. Transferred to [place].

## 2022-04-25 NOTE — Telephone Encounter (Signed)
Pt called to confirm appointment and to see if she should be seen sooner. Per hospital discharge, pt to follow up in 1 week. Appointment moved up to 05/05/22. Pt also questioning if ECHO scheduled for 12/13 can be cancelled as she recently had one on 11/25.   Nurse informed pt it should be ok to cancel, but forward for confirmation.   Pt also mailed monitor back last week.

## 2022-04-26 NOTE — Telephone Encounter (Signed)
Pt updated and verbalized understanding. ECHO for 12/13 cancelled   Gerrie Nordmann, NP  You16 hours ago (4:45 PM)    If routine echo was recently done in the hospital then outpt echo can be cancelled.

## 2022-04-27 NOTE — Addendum Note (Signed)
Encounter addended by: Jeannette How on: 04/27/2022 3:26 PM  Actions taken: Imaging Exam ended

## 2022-04-28 ENCOUNTER — Other Ambulatory Visit: Payer: Self-pay | Admitting: Cardiology

## 2022-05-02 MED FILL — Perflutren Lipid Microsphere IV Susp 1.1 MG/ML: INTRAVENOUS | Qty: 10 | Status: AC
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
## 2022-05-05 ENCOUNTER — Encounter: Payer: Self-pay | Admitting: Cardiology

## 2022-05-05 ENCOUNTER — Ambulatory Visit: Payer: Medicare Other | Attending: Cardiology | Admitting: Cardiology

## 2022-05-05 VITALS — BP 122/64 | HR 57 | Ht 63.0 in | Wt 129.2 lb

## 2022-05-05 DIAGNOSIS — I5032 Chronic diastolic (congestive) heart failure: Secondary | ICD-10-CM

## 2022-05-05 DIAGNOSIS — I10 Essential (primary) hypertension: Secondary | ICD-10-CM

## 2022-05-05 DIAGNOSIS — Z95818 Presence of other cardiac implants and grafts: Secondary | ICD-10-CM

## 2022-05-05 DIAGNOSIS — I2510 Atherosclerotic heart disease of native coronary artery without angina pectoris: Secondary | ICD-10-CM | POA: Diagnosis not present

## 2022-05-05 DIAGNOSIS — R296 Repeated falls: Secondary | ICD-10-CM

## 2022-05-05 DIAGNOSIS — I480 Paroxysmal atrial fibrillation: Secondary | ICD-10-CM | POA: Diagnosis not present

## 2022-05-05 DIAGNOSIS — Z8679 Personal history of other diseases of the circulatory system: Secondary | ICD-10-CM

## 2022-05-05 NOTE — Patient Instructions (Signed)
Medication Instructions:  Your Physician recommend you continue on your current medication as directed.    *If you need a refill on your cardiac medications before your next appointment, please call your pharmacy*   Lab Work: None ordered today   Testing/Procedures: None ordered today   Follow-Up: At [facility], you and your health needs are our priority.  As part of our continuing mission to provide you with exceptional heart care, we have created designated Provider Care Teams.  These Care Teams include your primary Cardiologist (physician) and Advanced Practice Providers (APPs -  Physician Assistants and Nurse Practitioners) who all work together to provide you with the care you need, when you need it.  We recommend signing up for the patient portal called "MyChart".  Sign up information is provided on this After Visit Summary.  MyChart is used to connect with patients for Virtual Visits (Telemedicine).  Patients are able to view lab/test results, encounter notes, upcoming appointments, etc.  Non-urgent messages can be sent to your provider as well.   To learn more about what you can do with MyChart, go to [email or web address].    Your next appointment:   3 month(s)  The format for your next appointment:   In Person  Provider:   You may see Nelva Bush, MD or one of the following Advanced Practice Providers on your designated Care Team:   Murray Hodgkins, NP Christell Faith, PA-C Cadence Kathlen Mody, PA-C Gerrie Nordmann, NP

## 2022-05-10 ENCOUNTER — Ambulatory Visit: Payer: Medicare Other | Admitting: Physician Assistant

## 2022-05-10 ENCOUNTER — Other Ambulatory Visit: Payer: Medicare Other

## 2022-05-17 ENCOUNTER — Other Ambulatory Visit
Admission: RE | Admit: 2022-05-17 | Discharge: 2022-05-17 | Disposition: A | Discharge status: Home / Self Care | Payer: Medicare Other | Source: Ambulatory Visit | Attending: Cardiology | Admitting: Cardiology

## 2022-05-17 ENCOUNTER — Ambulatory Visit: Payer: Medicare Other | Attending: Cardiology | Admitting: Cardiology

## 2022-05-17 ENCOUNTER — Telehealth: Payer: Self-pay | Admitting: Cardiology

## 2022-05-17 ENCOUNTER — Encounter: Payer: Self-pay | Admitting: Cardiology

## 2022-05-17 VITALS — BP 122/50 | HR 67 | Ht 63.0 in | Wt 131.4 lb

## 2022-05-17 DIAGNOSIS — K760 Fatty (change of) liver, not elsewhere classified: Secondary | ICD-10-CM | POA: Insufficient documentation

## 2022-05-17 DIAGNOSIS — Z95818 Presence of other cardiac implants and grafts: Secondary | ICD-10-CM

## 2022-05-17 DIAGNOSIS — I619 Nontraumatic intracerebral hemorrhage, unspecified: Secondary | ICD-10-CM | POA: Diagnosis not present

## 2022-05-17 DIAGNOSIS — Z79899 Other long term (current) drug therapy: Secondary | ICD-10-CM

## 2022-05-17 DIAGNOSIS — I480 Paroxysmal atrial fibrillation: Secondary | ICD-10-CM

## 2022-05-17 DIAGNOSIS — E876 Hypokalemia: Secondary | ICD-10-CM

## 2022-05-17 DIAGNOSIS — E038 Other specified hypothyroidism: Secondary | ICD-10-CM

## 2022-05-17 LAB — COMPREHENSIVE METABOLIC PANEL
ALT: 31 U/L (ref 0–44)
AST: 26 U/L (ref 15–41)
Albumin: 3.8 g/dL (ref 3.5–5.0)
Alkaline Phosphatase: 72 U/L (ref 38–126)
Anion gap: 7 (ref 5–15)
BUN: 14 mg/dL (ref 8–23)
CO2: 29 mmol/L (ref 22–32)
Calcium: 8.7 mg/dL — ABNORMAL LOW (ref 8.9–10.3)
Chloride: 106 mmol/L (ref 98–111)
Creatinine, Ser: 0.6 mg/dL (ref 0.44–1.00)
GFR, Estimated: >60 mL/min (ref >60)
Glucose, Bld: 97 mg/dL (ref 70–99)
Potassium: 3.2 mmol/L — ABNORMAL LOW (ref 3.5–5.1)
Sodium: 142 mmol/L (ref 135–145)
Total Bilirubin: 0.8 mg/dL (ref 0.3–1.2)
Total Protein: 6.6 g/dL (ref 6.5–8.1)

## 2022-05-17 MED ORDER — AMIODARONE HCL 200 MG PO TABS: ORAL_TABLET | ORAL | 1 refills | Status: DC

## 2022-05-17 NOTE — Progress Notes (Signed)
Electrophysiology Office Follow up Visit Note:    Date:  05/17/2022   ID:  Sandra [name], DOB 09/28/1945, MRN [id number]  PCP:  Sandra Rocher, MD  [facility] Cardiologist:  Sandra Bush, MD  [facility] Electrophysiologist:  Sandra Epley, MD    Interval History:    Sandra [name] is a 76 y.o. female who presents for a follow up visit.  I last saw the patient July 27, 2021.  She has a history of watchman implant January 27, 2021.  She is maintained on aspirin 81 mg by mouth once daily given the presence of a well sealed watchman.  At the time of my last appointment in March she had a very low burden of atrial fibrillation.  She was hospitalized several times in November, states that the month was blur. She had a UTI that led to a fall. During fall, she broke her wrist and needed surgery. When she was discharged from surgery, she started to feel dizzy, lightheaded and went to PCP where she was profoundly hypotensive and was transported back to hospital by EMS. While admitted this time, was found to be AF with RVR, rate at arrival was 140.   At discharge, she wore a 2 week zio.   She notes that her AF episodes lately have been occurring more often and longer in duration. She feels fatigue and lightheaded during AF.   Her daughter Sandra [name] was on the phone during appointment and contributed to history.     Past Medical History:  Diagnosis Date   (HFpEF) heart failure with preserved ejection fraction ([place])    a. 08/2019 Echo: EF 60-65%, no rwma, mild LVH, Gr2 DD, nl RV fxn. Nl pASP. Mildly dil LA. Mild to mod MR.   Anemia    Anxiety    Arthritis    BRCA gene mutation negative 10/2016   NEGATIVE: [employer]   Breast cancer ([place]) 10/16/2016   T1c,N0;, GRADE I/III, 1.6cm. ER/PR pos  HER2 not over expressed, Right Upper Outer   Celiac disease    Celiac syndrome    Cerebellar hemorrhage, acute ([place]) 07/17/2020   Depression    Dyspnea    WITH EXERTION   Fatty liver     GERD (gastroesophageal reflux disease)    OCC   Glaucoma    right eye   Headache    H/O MIGRAINES AS TEENAGER.   Heart murmur    a. 08/2019 Echo: mild to mod MR.   Hypertension    Hypothyroidism    Non-obstructive CAD (coronary artery disease)    a. 01/2017 MV: Hypertensive response. No ischemia/infarct. EF >65%; b. 09/2019 Cor CTA: Ca2+ = 9.29 (36th percentile). LAD calcified plaque (0-24%), otw nl. Multipel bilat pulm nodules up to 20m.   Osteoporosis    PAF ([place]oxysmal [place] fibrillation) [place]    a. 02/2020 Zio: predominantly RSR, 65 (50-105), rare PACs/PVCs, 8 beats NSVT, multiple episodes of PAF lasting up to 1hr 251ms. Avg AF rate 130 (93-170). AF burden <1%. Triggered events = RSR, PACs, and PAF; b. CHA2DS2VASc = 6.   Pneumonia    YEARS AGO   Pre-diabetes    Presence of Watchman left atrial appendage closure device 01/2021   Pulmonary nodules    a. 09/2019 Cor CTA: incidental finding of multiple bilat pulm nodules; b. 12/2019 High Res CT: stable, scattered solid pulm nodules.   Raynaud's disease    RLS (restless legs syndrome)    [place]   mild balance issues  Wears hearing aid in both ears     Past Surgical History:  Procedure Laterality Date   ABDOMINAL HYSTERECTOMY     APPENDECTOMY     AXILLARY LYMPH NODE BIOPSY Right 03/05/2017   Procedure: AXILLARY LYMPH NODE BIOPSY;  Surgeon: Sandra Bellow, MD;  Location: [facility] ORS;  Service: General;  Laterality: Right;   CATARACT EXTRACTION Bilateral    CESAREAN SECTION     COLONOSCOPY WITH PROPOFOL N/A 05/19/2015   Procedure: COLONOSCOPY WITH PROPOFOL;  Surgeon: Sandra Silvas, MD;  Location: [facility] ENDOSCOPY;  Service: Endoscopy;  Laterality: N/A;   COLONOSCOPY WITH PROPOFOL N/A 08/01/2019   Procedure: COLONOSCOPY WITH PROPOFOL;  Surgeon: Sandra Lame, MD;  Location: [facility] ENDOSCOPY;  Service: Endoscopy;  Laterality: N/A;   EYE SURGERY     HAND SURGERY Left    LEFT ATRIAL APPENDAGE OCCLUSION N/A 01/27/2021    Procedure: LEFT ATRIAL APPENDAGE OCCLUSION;  Surgeon: Sandra Epley, MD;  Location: [place] CV LAB;  Service: Cardiovascular;  Laterality: N/A;   MASTECTOMY Right 11/07/2016   RESIDUAL INVASIVE MAMMARY CARCINOMA, SUBAREOLAR ANTERIOR TO PREVIOUS    OPEN REDUCTION INTERNAL FIXATION (ORIF) DISTAL RADIAL FRACTURE Right 04/11/2022   Procedure: OPEN REDUCTION INTERNAL FIXATION (ORIF) DISTAL RADIUS FRACTURE;  Surgeon: Sandra Knows, MD;  Location: [place];  Service: Orthopedics;  Laterality: Right;   SENTINEL NODE BIOPSY Right 11/07/2016   Procedure: SENTINEL NODE BIOPSY;  Surgeon: Sandra Bellow, MD;  Location: [facility] ORS;  Service: General;  Laterality: Right;   SIMPLE MASTECTOMY WITH AXILLARY SENTINEL NODE BIOPSY Right 11/07/2016   6 mm ER/PR 100%; Her 2 neu not overexpressed, T1b, N0.  Surgeon: Sandra Bellow, MD;  Location: [facility] ORS;  Service: General;  Laterality: Right;   TEE WITHOUT CARDIOVERSION N/A 01/27/2021   Procedure: TRANSESOPHAGEAL ECHOCARDIOGRAM (TEE);  Surgeon: Sandra Epley, MD;  Location: [place] CV LAB;  Service: Cardiovascular;  Laterality: N/A;   TEE WITHOUT CARDIOVERSION N/A 03/10/2021   Procedure: TRANSESOPHAGEAL ECHOCARDIOGRAM (TEE);  Surgeon: Sandra Munroe, MD;  Location: [place];  Service: Cardiology;  Laterality: N/A;   TONSILLECTOMY  AGE 89   UPPER GI ENDOSCOPY  01/12/06   hiatus hernia    Current Medications: Current Meds  Medication Sig   acetaminophen (TYLENOL) 325 MG tablet Take 2 tablets (650 mg total) by mouth every 6 (six) hours as needed for mild pain (or Fever >/= 101).   ALPRAZolam (XANAX) 0.5 MG tablet Take 1 tablet (0.5 mg total) by mouth at bedtime as needed. for sleep   amiodarone (PACERONE) 200 MG tablet Take 1 tablet (200 mg) by mouth twice daily x 10 days, then take 1 tablet (200 mg) by mouth once daily   aspirin EC 81 MG tablet Take 1 tablet (81 mg total) by mouth daily. Swallow whole.   atorvastatin (LIPITOR)  40 MG tablet TAKE 1 TABLET BY MOUTH DAILY   bisacodyl 5 MG EC tablet Take 5 mg by mouth daily as needed for moderate constipation.   Calcium Carb-Cholecalciferol (CALCIUM 600 + D PO) Take 1 tablet by mouth daily.   Cholecalciferol (VITAMIN D3) 125 MCG (5000 UT) TABS Take 5,000 Units by mouth daily.   docusate sodium (COLACE) 100 MG capsule Take 100 mg by mouth daily as needed for mild constipation.   hydroxypropyl methylcellulose / hypromellose (ISOPTO TEARS / GONIOVISC) 2.5 % ophthalmic solution Place 1 drop into both eyes 4 (four) times daily as needed for dry eyes.   iron polysaccharides (NIFEREX) 150 MG capsule  Take 1 capsule (150 mg total) by mouth daily. Or can take any over-the-counter iron supplement.   latanoprost (XALATAN) 0.005 % ophthalmic solution Place 1 drop into both eyes at bedtime.   levothyroxine (SYNTHROID) 100 MCG tablet TAKE 1 TABLET BY MOUTH 30  MINUTES BEFORE BREAKFAST   metoprolol succinate (TOPROL-XL) 25 MG 24 hr tablet Take 0.5 tablets (12.5 mg total) by mouth daily. May take additional 12.5 mg for A Fib.  Home med.   Multiple Vitamin (MULTIVITAMIN WITH MINERALS) TABS tablet Take 1 tablet by mouth daily at 3 pm.   omeprazole (PRILOSEC) 40 MG capsule TAKE 1 CAPSULE BY MOUTH DAILY   potassium chloride (KLOR-CON) 20 MEQ packet Take 20 mEq by mouth daily.   timolol (BETIMOL) 0.5 % ophthalmic solution Place 1 drop into both eyes 2 (two) times daily.   [DISCONTINUED] diltiazem (CARDIZEM CD) 180 MG 24 hr capsule Take 1 capsule (180 mg total) by mouth daily.   [DISCONTINUED] sertraline (ZOLOFT) 100 MG tablet TAKE 1 TABLET BY MOUTH DAILY (Patient taking differently: Take 100 mg by mouth daily.)     Allergies:   Tape   Social History   Socioeconomic History   Marital status: Married    Spouse name: Nadara Mustard   Number of children: 2   Years of education: Not on file   Highest education level: Some college, no degree  Occupational History   Occupation: retired  Tobacco Use    Smoking status: Never   Smokeless tobacco: Never  Vaping Use   Vaping Use: Never used  Substance and Sexual Activity   Alcohol use: Not Currently   Drug use: No   Sexual activity: Not Currently    Birth control/protection: None  Other Topics Concern   Not on file  Social History Narrative   Lives with Husband   Right handed   Drinks 2-3 cups caffiene daily   Social Determinants of Health   Financial Resource Strain: Low Risk  (05/18/2021)   Overall Financial Resource Strain (CARDIA)    Difficulty of Paying Living Expenses: Not hard at all  Food Insecurity: No Food Insecurity (04/22/2022)   Hunger Vital Sign    Worried About Running Out of Food in the Last Year: Never true    [place] in the Last Year: Never true  Transportation Needs: Unmet Transportation Needs (04/22/2022)   PRAPARE - Transportation    Lack of Transportation (Medical): Yes    Lack of Transportation (Non-Medical): Yes  Physical Activity: Insufficiently Active (05/18/2021)   Exercise Vital Sign    Days of Exercise per Week: 3 days    Minutes of Exercise per Session: 30 min  Stress: No Stress Concern Present (05/18/2021)   [place]    Feeling of Stress : Only a little  Social Connections: Socially Integrated (05/18/2021)   Social Connection and Isolation Panel [NHANES]    Frequency of Communication with Friends and Family: More than three times a week    Frequency of Social Gatherings with Friends and Family: More than three times a week    Attends Religious Services: More than 4 times per year    Active Member of [employer] or Organizations: Yes    Attends Music therapist: More than 4 times per year    Marital Status: Married     Family History: The patient's family history includes Alcohol abuse in her father; Anemia in her mother; Breast cancer in her sister; Breast cancer (age  of onset: 38) in her maternal  grandmother; CAD in her paternal grandfather; Cancer in her mother and sister; Heart attack in her maternal grandfather; Other in her mother; Prostate cancer in her father; Stroke in her maternal grandmother.  ROS:   Please see the history of present illness.    All other systems reviewed and are negative.  EKGs/Labs/Other Studies Reviewed:    The following studies were reviewed today:  April 22, 2022 echo EF 60% RV normal Dilated atrium Mild MR  May 03, 2022 ZIO monitor Longest episode of atrial fibrillation lasting 1 minute and 37 seconds Rare atrial and ventricular ectopy   EKG:  not ordered today  Recent Labs: 04/09/2022: ALT 42 04/21/2022: B Natriuretic Peptide 691.3; TSH 3.815 04/23/2022: BUN 14; Creatinine, Ser 0.64; Hemoglobin 11.3; Magnesium 2.0; Platelets 349; Potassium 3.8; Sodium 142  Recent Lipid Panel    Component Value Date/Time   CHOL 138 06/22/2021 1108   TRIG 128 06/22/2021 1108   HDL 52 06/22/2021 1108   CHOLHDL 2.7 06/22/2021 1108   CHOLHDL 2.7 07/17/2020 0436   VLDL 19 07/17/2020 0436   LDLCALC 63 06/22/2021 1108    Physical Exam:    VS:  BP (!) 122/50 (BP Location: Left Arm, Patient Position: Sitting, Cuff Size: Normal)   Pulse 67   Ht _0  (1.6 m)   Wt 131 lb 6.4 oz (59.6 kg)   SpO2 97%   BMI 23.28 kg/m     Wt Readings from Last 3 Encounters:  05/17/22 131 lb 6.4 oz (59.6 kg)  05/05/22 129 lb 3.2 oz (58.6 kg)  04/23/22 122 lb 5.7 oz (55.5 kg)     GEN: Well nourished, well developed in no acute distress HEENT: Normal NECK: No JVD; No carotid bruits LYMPHATICS: No lymphadenopathy CARDIAC: RRR, no murmurs, rubs, gallops RESPIRATORY:  Clear to auscultation without rales, wheezing or rhonchi  ABDOMEN: Soft, non-tender, non-distended MUSCULOSKELETAL:  No edema; No deformity  SKIN: Warm and dry NEUROLOGIC:  Alert and oriented x 3 PSYCHIATRIC:  Normal affect     ASSESSMENT:    1. PAF (paroxysmal atrial fibrillation) ([place])    2. Presence of Watchman left atrial appendage closure device   3. Hemorrhagic stroke ([place])   4. Medication management   5. Other specified hypothyroidism    PLAN:    In order of problems listed above:  #Paroxysmal atrial fibrillation # Hypothyroidism # NAFLD Highly symptomatic, occurring more often for longer periods of time Not a good candidate for catheter ablation given history of intracranial hemorrhage and need for prolonged anticoagulation surrounding an A-fib ablation. Discussed AAD drug options with the patient including Multaq, amiodarone and Tikosyn. Unfortunately, given L ventricular hypertrophy on recent echo, multaq is not a great option, and EKG is not suitable for tikosyn.  I would recommend amiodarone.  I discussed the risks of amiodarone and she wishes to proceed. Will use 200 mg by mouth twice daily for 10 days followed by 200 mg by mouth daily thereafter.  I will have her stop her diltiazem as we start the amiodarone as to not have a significant impact on heart rate.  #Watchman in situ #History of intracranial hemorrhage Continue aspirin 81 mg by mouth once daily.  Follow-up repeat labs in 8 weeks, OV in 9 weeks. Will need repeat CMP, TSH and free T4 at that visit for amiodarone monitoring.    Medication Adjustments/Labs and Tests Ordered: Current medicines are reviewed at length with the patient today.  Concerns regarding medicines are  outlined above.  Orders Placed This Encounter  Procedures   Comp Met (CMET)   Thyroid Panel With TSH   EKG 12-Lead   Meds ordered this encounter  Medications   amiodarone (PACERONE) 200 MG tablet    Sig: Take 1 tablet (200 mg) by mouth twice daily x 10 days, then take 1 tablet (200 mg) by mouth once daily    Dispense:  90 tablet    Refill:  1     Signed, Lars Mage, MD, [facility], [facility] 05/17/2022 3:18 PM    Electrophysiology [facility]

## 2022-05-17 NOTE — Telephone Encounter (Signed)
Potassium level of 3.2 per labs done at [facility]. Attempted to reach patient to see if she is currently on potassium supplement, because chart has 2 listings (one was for 7 days, should be complete, but second one shows active, not started).  Arby Barrette, CPhT  -- 04/21/2022 16:06  >> Arby Barrette   Fri Apr 21, 2022  4:06 PM  Change in therapy, not started yet   Unable to reach patient. Left message asking that she call office back at her convenience to review labs/medication question. Will forward to MD for review. Not critical K, so feel we have time to wait response. Not on a diuretic.

## 2022-05-17 NOTE — Patient Instructions (Addendum)
Medication Instructions:  - Your physician has recommended you make the following change in your medication:   1) START Amiodarone 200 mg: - take 1 tablet by mouth TWICE daily x 10 days, then - take 1 tablet by mouth ONCE daily  2. STOP diltiazem  *If you need a refill on your cardiac medications before your next appointment, please call your pharmacy*   Lab Work: 1) Your physician recommends that you have lab work today:  CMET/ Thyroid panel    2) Repeat lab work in 8 weeks:  Liver/ Thyroid Civil [employer] fast streamer at [facility] 1st desk on the right to check in (REGISTRATION)  Lab hours: Monday- Friday (7:30 am- 5:30 pm)  If you have labs (blood work) drawn today and your tests are completely normal, you will receive your results only by: MyChart Message (if you have MyChart) OR A paper copy in the mail If you have any lab test that is abnormal or we need to change your treatment, we will call you to review the results.   Testing/Procedures: - none ordered   Follow-Up: At [facility], you and your health needs are our priority.  As part of our continuing mission to provide you with exceptional heart care, we have created designated Provider Care Teams.  These Care Teams include your primary Cardiologist (physician) and Advanced Practice Providers (APPs -  Physician Assistants and Nurse Practitioners) who all work together to provide you with the care you need, when you need it.  We recommend signing up for the patient portal called "MyChart".  Sign up information is provided on this After Visit Summary.  MyChart is used to connect with patients for Virtual Visits (Telemedicine).  Patients are able to view lab/test results, encounter notes, upcoming appointments, etc.  Non-urgent messages can be sent to your provider as well.   To learn more about what you can do with MyChart, go to [email or web address].    Your next appointment:   9 week(s)  The format for  your next appointment:   In Person  Provider:   Lars Mage, MD    Other Instructions  Amiodarone Tablets What is this medication? AMIODARONE (a MEE oh da rone) prevents and treats a fast or irregular heartbeat (arrhythmia). It works by slowing down overactive electric signals in the heart, which stabilizes your heart rhythm. It belongs to a group of medications called antiarrhythmics. This medicine may be used for other purposes; ask your health care provider or pharmacist if you have questions. COMMON BRAND NAME(S): Cordarone, Pacerone What should I tell my care team before I take this medication? They need to know if you have any of these conditions: Liver disease Lung disease Other heart problems Thyroid disease An unusual or allergic reaction to amiodarone, iodine, other medications, foods, dyes, or preservatives Pregnant or trying to get pregnant Breast-feeding How should I use this medication? Take this medication by mouth with water. Take it as directed on the prescription label at the same time every day. You can take it with or without food. You should always take it the same way. Keep taking it unless your care team tells you to stop. A special MedGuide will be given to you by the pharmacist with each prescription and refill. Be sure to read this information carefully each time. Talk to your care team about the use of this medication in children. Special care may be needed. Overdosage: If you think you have taken too much of this medicine  contact a poison control center or emergency room at once. NOTE: This medicine is only for you. Do not share this medicine with others. What if I miss a dose? If you miss a dose, take it as soon as you can. If it is almost time for your next dose, take only that dose. Do not take double or extra doses. What may interact with this medication? Do not take this medication with any of the following: Abarelix Apomorphine Arsenic  trioxide Certain antibiotics, such as erythromycin, gemifloxacin, levofloxacin, or pentamidine Certain medications for depression, such as amoxapine or tricyclic antidepressants Certain medications for fungal infections, such as fluconazole, itraconazole, ketoconazole, posaconazole, or voriconazole Certain medications for irregular heartbeat, such as disopyramide, dronedarone, ibutilide, propafenone, or sotalol Certain medications for malaria, such as chloroquine or halofantrine Cisapride Droperidol Haloperidol [place] Maprotiline Methadone Phenothiazines, such as chlorpromazine, mesoridazine, or thioridazine Pimozide Ranolazine Red yeast rice Vardenafil This medication may also interact with the following: Antivirals for HIV Certain medications for blood pressure, heart disease, irregular heartbeat Certain medications for cholesterol, such as atorvastatin, cerivastatin, lovastatin, or simvastatin Certain medications for hepatitis C, such as sofosbuvir and ledipasvir; sofosbuvir Certain medications for seizures, such as phenytoin Certain medications for thyroid problems Certain medications that prevent or treat blood clots, such as warfarin Cholestyramine Cimetidine Clopidogrel Cyclosporine Dextromethorphan Diuretics Dofetilide Fentanyl General anesthetics Grapefruit juice Lidocaine Loratadine Methotrexate Other medications that cause heart rhythm changes Procainamide Quinidine Rifabutin, rifampin, or rifapentine St. John's Wort Trazodone Ziprasidone This list may not describe all possible interactions. Give your health care provider a list of all the medicines, herbs, non-prescription drugs, or dietary supplements you use. Also tell them if you smoke, drink alcohol, or use illegal drugs. Some items may interact with your medicine. What should I watch for while using this medication? Your condition will be monitored closely when you first begin therapy. This medication  is often started in a hospital or other monitored health care setting. Once you are on maintenance therapy, visit your care team for regular checks on your progress. Because your condition and use of this medication carry some risk, it is a good idea to carry an identification card, necklace, or bracelet with details of your condition, medications, and care team. This medication may affect your coordination, reaction time, or judgment. Do not drive or operate machinery until you know how this medication affects you. Sit up or stand slowly to reduce the risk of dizzy or fainting spells. Drinking alcohol with this medication can increase the risk of these side effects. This medication can make you more sensitive to the sun. Keep out of the sun. If you cannot avoid being in the sun, wear protective clothing and sunscreen. Do not use sun lamps, tanning beds, or tanning booths. You should have regular eye exams before and during treatment. Call your care team if you have blurred vision, see halos, or your eyes become sensitive to light. Your eyes may get dry. It may be helpful to use a lubricating eye solution or artificial tears solution. If you are going to have surgery or a procedure that requires contrast dyes, tell your care team that you are taking this medication. What side effects may I notice from receiving this medication? Side effects that you should report to your care team as soon as possible: Allergic reactions--skin rash, itching, hives, swelling of the face, lips, tongue, or throat Bluish-gray skin Change in vision such as blurry vision, seeing halos around lights, vision loss Heart failure--shortness of breath,  swelling of the ankles, feet, or hands, sudden weight gain, unusual weakness or fatigue Heart rhythm changes--fast or irregular heartbeat, dizziness, feeling faint or lightheaded, chest pain, trouble breathing High thyroid levels (hyperthyroidism)--fast or irregular heartbeat, weight  loss, excessive sweating or sensitivity to heat, tremors or shaking, anxiety, nervousness, irregular menstrual cycle or spotting Liver injury--right upper belly pain, loss of appetite, nausea, light-colored stool, dark yellow or brown urine, yellowing skin or eyes, unusual weakness or fatigue Low thyroid levels (hypothyroidism)--unusual weakness or fatigue, sensitivity to cold, constipation, hair loss, dry skin, weight gain, feelings of depression Lung injury--shortness of breath or trouble breathing, cough, spitting up blood, chest pain, fever Pain, tingling, or numbness in the hands or feet, muscle weakness, trouble walking, loss of balance or coordination Side effects that usually do not require medical attention (report to your care team if they continue or are bothersome): Nausea Vomiting This list may not describe all possible side effects. Call your doctor for medical advice about side effects. You may report side effects to FDA at 1-800-FDA-1088. Where should I keep my medication? Keep out of the reach of children and pets. Store at room temperature between 20 and 25 degrees C (68 and 77 degrees F). Protect from light. Keep container tightly closed. Throw away any unused medication after the expiration date. NOTE: This sheet is a summary. It may not cover all possible information. If you have questions about this medicine, talk to your doctor, pharmacist, or health care provider.  2023 Elsevier/Gold Standard (2020-07-09 00:00:00)   Important Information About Sugar

## 2022-05-18 MED ORDER — POTASSIUM CHLORIDE 20 MEQ PO PACK: PACK | ORAL | 1 refills | Status: DC

## 2022-05-18 NOTE — Addendum Note (Signed)
Addended by: Alvis Lemmings C on: 05/18/2022 10:36 AM   Modules accepted: Orders

## 2022-05-18 NOTE — Telephone Encounter (Signed)
Patient was returning call. Please advise ?

## 2022-05-18 NOTE — Telephone Encounter (Signed)
I called and spoke with the patient regarding her lab results (CMET) from 05/17/22. Thyroid panel is still pending.  She voices understanding of her results and Dr. Mardene Speak recommendations to:  1) INCREASE potassium to 40 meq QD 2) repeat a BMP in 2 weeks  Per the patient, she is not able to swallow the 20 meq tablets of potassium very well. She has been using potassium packets.  She would like her potassium packets refilled to [facility].   She will use the 20 meq K+ tablets until her RX is received from mail order.   She is advised to report to the [place] at [facility] in 2 weeks for a repeat BMP.

## 2022-05-19 LAB — THYROID PANEL WITH TSH
Free Thyroxine Index: 2 (ref 1.2–4.9)
T3 Uptake Ratio: 29 % (ref 24–39)
T4, Total: 7 ug/dL (ref 4.5–12.0)
TSH: 4.79 u[IU]/mL — ABNORMAL HIGH (ref 0.450–4.500)

## 2022-06-14 ENCOUNTER — Other Ambulatory Visit: Payer: Self-pay | Admitting: *Deleted

## 2022-06-14 DIAGNOSIS — I480 Paroxysmal atrial fibrillation: Secondary | ICD-10-CM

## 2022-06-14 DIAGNOSIS — E876 Hypokalemia: Secondary | ICD-10-CM

## 2022-06-14 DIAGNOSIS — Z79899 Other long term (current) drug therapy: Secondary | ICD-10-CM

## 2022-06-15 ENCOUNTER — Encounter: Payer: Medicare Other | Admitting: Family Medicine

## 2022-07-02 ENCOUNTER — Other Ambulatory Visit: Payer: Self-pay | Admitting: Cardiology

## 2022-07-12 ENCOUNTER — Other Ambulatory Visit
Admission: RE | Admit: 2022-07-12 | Discharge: 2022-07-12 | Disposition: A | Discharge status: Home / Self Care | Payer: Medicare Other | Source: Ambulatory Visit | Attending: Cardiology | Admitting: Cardiology

## 2022-07-12 DIAGNOSIS — E876 Hypokalemia: Secondary | ICD-10-CM | POA: Diagnosis present

## 2022-07-12 DIAGNOSIS — I480 Paroxysmal atrial fibrillation: Secondary | ICD-10-CM | POA: Diagnosis present

## 2022-07-12 DIAGNOSIS — Z79899 Other long term (current) drug therapy: Secondary | ICD-10-CM | POA: Diagnosis present

## 2022-07-12 LAB — COMPREHENSIVE METABOLIC PANEL
ALT: 43 U/L (ref 0–44)
AST: 35 U/L (ref 15–41)
Albumin: 3.9 g/dL (ref 3.5–5.0)
Alkaline Phosphatase: 75 U/L (ref 38–126)
Anion gap: 7 (ref 5–15)
BUN: 14 mg/dL (ref 8–23)
CO2: 27 mmol/L (ref 22–32)
Calcium: 8.4 mg/dL — ABNORMAL LOW (ref 8.9–10.3)
Chloride: 102 mmol/L (ref 98–111)
Creatinine, Ser: 0.76 mg/dL (ref 0.44–1.00)
GFR, Estimated: >60 mL/min (ref >60)
Glucose, Bld: 109 mg/dL — ABNORMAL HIGH (ref 70–99)
Potassium: 4.2 mmol/L (ref 3.5–5.1)
Sodium: 136 mmol/L (ref 135–145)
Total Bilirubin: 1.2 mg/dL (ref 0.3–1.2)
Total Protein: 6.6 g/dL (ref 6.5–8.1)

## 2022-07-12 LAB — TSH: TSH: 3.532 u[IU]/mL (ref 0.350–4.500)

## 2022-07-16 NOTE — Progress Notes (Unsigned)
Electrophysiology Office Follow up Visit Note:    Date:  07/19/2022   ID:  Sandra [name], DOB 09-16-1945, MRN [id number]  PCP:  Sandra Rocher, MD  [facility] Cardiologist:  Sandra Bush, MD  [facility] Electrophysiologist:  Sandra Epley, MD    Interval History:    Sandra [name] is a 77 y.o. female who presents for a follow up visit. They were last seen in clinic 05/17/2022 for persistent AF. She has a Forensic scientist iin place given hx of ICH.  I saw her 12/20 and started on amiodarone.She takes aspirin for stroke ppx given watchman in place.  Today she tells me she is doing well.  No off target effects from amiodarone.    Past Medical History:  Diagnosis Date   (HFpEF) heart failure with preserved ejection fraction ([place])    a. 08/2019 Echo: EF 60-65%, no rwma, mild LVH, Gr2 DD, nl RV fxn. Nl pASP. Mildly dil LA. Mild to mod MR.   Anemia    Anxiety    Arthritis    BRCA gene mutation negative 10/2016   NEGATIVE: [employer]   Breast cancer ([place]) 10/16/2016   T1c,N0;, GRADE I/III, 1.6cm. ER/PR pos  HER2 not over expressed, Right Upper Outer   Celiac disease    Celiac syndrome    Cerebellar hemorrhage, acute ([place]) 07/17/2020   Depression    Dyspnea    WITH EXERTION   Fatty liver    GERD (gastroesophageal reflux disease)    OCC   Glaucoma    right eye   Headache    H/O MIGRAINES AS TEENAGER.   Heart murmur    a. 08/2019 Echo: mild to mod MR.   Hypertension    Hypothyroidism    Non-obstructive CAD (coronary artery disease)    a. 01/2017 MV: Hypertensive response. No ischemia/infarct. EF >65%; b. 09/2019 Cor CTA: Ca2+ = 9.29 (36th percentile). LAD calcified plaque (0-24%), otw nl. Multipel bilat pulm nodules up to 12m.   Osteoporosis    PAF (paroxysmal atrial fibrillation) ([place]    a. 02/2020 Zio: predominantly RSR, 65 (50-105), rare PACs/PVCs, 8 beats NSVT, multiple episodes of PAF lasting up to 1hr 253ms. Avg AF rate 130 (93-170). AF burden <1%. Triggered  events = RSR, PACs, and PAF; b. CHA2DS2VASc = 6.   Pneumonia    YEARS AGO   Pre-diabetes    Presence of Watchman left atrial appendage closure device 01/2021   Pulmonary nodules    a. 09/2019 Cor CTA: incidental finding of multiple bilat pulm nodules; b. 12/2019 High Res CT: stable, scattered solid pulm nodules.   Raynaud's disease    RLS (restless legs syndrome)    Stroke ([place]/2022   mild balance issues   Wears hearing aid in both ears     Past Surgical History:  Procedure Laterality Date   ABDOMINAL HYSTERECTOMY     APPENDECTOMY     AXILLARY LYMPH NODE BIOPSY Right 03/05/2017   Procedure: AXILLARY LYMPH NODE BIOPSY;  Surgeon: ByRobert BellowMD;  Location: [facility] OR[facility];  Service: General;  Laterality: Right;   CATARACT EXTRACTION Bilateral    CESAREAN SECTION     COLONOSCOPY WITH PROPOFOL N/A 05/19/2015   Procedure: COLONOSCOPY WITH PROPOFOL;  Surgeon: Sandra SilvasMD;  [facility];  Service: Endoscopy;  Laterality: N/A;   COLONOSCOPY WITH PROPOFOL N/A 08/01/2019   Procedure: COLONOSCOPY WITH PROPOFOL;  Surgeon: Sandra LameMD;  [facility];  Service: Endoscopy;  Laterality: N/A;   EYE  SURGERY     HAND SURGERY Left    LEFT ATRIAL APPENDAGE OCCLUSION N/A 01/27/2021   Procedure: LEFT ATRIAL APPENDAGE OCCLUSION;  Surgeon: Sandra Epley, MD;  Location: [place] [facility];  Service: Cardiovascular;  Laterality: N/A;   MASTECTOMY Right 11/07/2016   RESIDUAL INVASIVE MAMMARY CARCINOMA, SUBAREOLAR ANTERIOR TO PREVIOUS    OPEN REDUCTION INTERNAL FIXATION (ORIF) DISTAL RADIAL FRACTURE Right 04/11/2022   Procedure: OPEN REDUCTION INTERNAL FIXATION (ORIF) DISTAL RADIUS FRACTURE;  Surgeon: Sandra Knows, MD;  Location: [place];  Service: Orthopedics;  Laterality: Right;   SENTINEL NODE BIOPSY Right 11/07/2016   Procedure: SENTINEL NODE BIOPSY;  Surgeon: Sandra Bellow, MD;  Location: [facility];  Service: General;  Laterality: Right;   SIMPLE  MASTECTOMY WITH AXILLARY SENTINEL NODE BIOPSY Right 11/07/2016   6 mm ER/PR 100%; Her 2 neu not overexpressed, T1b, N0.  Surgeon: Sandra Bellow, MD;  Location: [facility];  Service: General;  Laterality: Right;   TEE WITHOUT CARDIOVERSION N/A 01/27/2021   Procedure: TRANSESOPHAGEAL ECHOCARDIOGRAM (TEE);  Surgeon: Sandra Epley, MD;  Location: [place] [facility];  Service: Cardiovascular;  Laterality: N/A;   TEE WITHOUT CARDIOVERSION N/A 03/10/2021   Procedure: TRANSESOPHAGEAL ECHOCARDIOGRAM (TEE);  Surgeon: Sandra Munroe, MD;  Location: [place];  Service: Cardiology;  Laterality: N/A;   TONSILLECTOMY  AGE 6   UPPER GI ENDOSCOPY  01/12/06   hiatus hernia    Current Medications: Current Meds  Medication Sig   acetaminophen (TYLENOL) 325 MG tablet Take 2 tablets (650 mg total) by mouth every 6 (six) hours as needed for mild pain (or Fever >/= 101).   ALPRAZolam (XANAX) 0.5 MG tablet Take 1 tablet (0.5 mg total) by mouth at bedtime as needed. for sleep   amiodarone (PACERONE) 200 MG tablet Take 200 mg by mouth daily.   aspirin EC 81 MG tablet Take 1 tablet (81 mg total) by mouth daily. Swallow whole.   atorvastatin (LIPITOR) 40 MG tablet TAKE 1 TABLET BY MOUTH DAILY   bisacodyl 5 MG EC tablet Take 5 mg by mouth daily as needed for moderate constipation.   Calcium Carb-Cholecalciferol (CALCIUM 600 + D PO) Take 1 tablet by mouth daily.   Cholecalciferol (VITAMIN D3) 125 MCG (5000 UT) TABS Take 5,000 Units by mouth daily.   docusate sodium (COLACE) 100 MG capsule Take 100 mg by mouth daily as needed for mild constipation.   hydroxypropyl methylcellulose / hypromellose (ISOPTO TEARS / GONIOVISC) 2.5 % ophthalmic solution Place 1 drop into both eyes 4 (four) times daily as needed for dry eyes.   iron polysaccharides (NIFEREX) 150 MG capsule Take 1 capsule (150 mg total) by mouth daily. Or can take any over-the-counter iron supplement.   latanoprost (XALATAN) 0.005 % ophthalmic solution  Place 1 drop into both eyes at bedtime.   levothyroxine (SYNTHROID) 100 MCG tablet TAKE 1 TABLET BY MOUTH 30  MINUTES BEFORE BREAKFAST   metoprolol succinate (TOPROL-XL) 25 MG 24 hr tablet TAKE ONE-HALF TABLET BY MOUTH  DAILY AT BEDTIME   Multiple Vitamin (MULTIVITAMIN WITH MINERALS) TABS tablet Take 1 tablet by mouth daily at 3 pm.   omeprazole (PRILOSEC) 40 MG capsule TAKE 1 CAPSULE BY MOUTH DAILY   potassium chloride (KLOR-CON) 20 MEQ packet Take 2 packets (40 meq) by mouth once daily   timolol (BETIMOL) 0.5 % ophthalmic solution Place 1 drop into both eyes 2 (two) times daily.   [DISCONTINUED] amiodarone (PACERONE) 200 MG tablet Take 1 tablet (200 mg)  by mouth twice daily x 10 days, then take 1 tablet (200 mg) by mouth once daily     Allergies:   Tape   Social History   Socioeconomic History   Marital status: Married    Spouse name: Nadara Mustard   Number of children: 2   Years of education: Not on file   Highest education level: Some college, no degree  Occupational History   Occupation: retired  Tobacco Use   Smoking status: Never   Smokeless tobacco: Never  Vaping Use   Vaping Use: Never used  Substance and Sexual Activity   Alcohol use: Not Currently   Drug use: No   Sexual activity: Not Currently    Birth control/protection: None  Other Topics Concern   Not on file  Social History Narrative   Lives with Husband   Right handed   Drinks 2-3 cups caffiene daily   Social Determinants of Health   Financial Resource Strain: Low Risk  (05/18/2021)   Overall Financial Resource Strain (CARDIA)    Difficulty of Paying Living Expenses: Not hard at all  Food Insecurity: No Food Insecurity (04/22/2022)   Hunger Vital Sign    Worried About Running Out of Food in the Last Year: Never true    [place] in the Last Year: Never true  Transportation Needs: Unmet Transportation Needs (04/22/2022)   PRAPARE - Transportation    Lack of Transportation (Medical): Yes    Lack of  Transportation (Non-Medical): Yes  Physical Activity: Insufficiently Active (05/18/2021)   Exercise Vital Sign    Days of Exercise per Week: 3 days    Minutes of Exercise per Session: 30 min  Stress: No Stress Concern Present (05/18/2021)   [place]    Feeling of Stress : Only a little  Social Connections: Socially Integrated (05/18/2021)   Social Connection and Isolation Panel [NHANES]    Frequency of Communication with Friends and Family: More than three times a week    Frequency of Social Gatherings with Friends and Family: More than three times a week    Attends Religious Services: More than 4 times per year    Active Member of [employer] or Organizations: Yes    Attends Music therapist: More than 4 times per year    Marital Status: Married     Family History: The patient's family history includes Alcohol abuse in her father; Anemia in her mother; Breast cancer in her sister; Breast cancer (age of onset: 69) in her maternal grandmother; CAD in her paternal grandfather; Cancer in her mother and sister; Heart attack in her maternal grandfather; Other in her mother; Prostate cancer in her father; Stroke in her maternal grandmother.  ROS:   Please see the history of present illness.    All other systems reviewed and are negative.  EKGs/Labs/Other Studies Reviewed:    The following studies were reviewed today:   EKG:  The ekg ordered today demonstrates sinus bradycardia with a ventricular rate of 54 bpm.  Recent Labs: 04/21/2022: B Natriuretic Peptide 691.3 04/23/2022: Hemoglobin 11.3; Magnesium 2.0; Platelets 349 07/12/2022: ALT 43; BUN 14; Creatinine, Ser 0.76; Potassium 4.2; Sodium 136; TSH 3.532  Recent Lipid Panel    Component Value Date/Time   CHOL 138 06/22/2021 1108   TRIG 128 06/22/2021 1108   HDL 52 06/22/2021 1108   CHOLHDL 2.7 06/22/2021 1108   CHOLHDL 2.7 07/17/2020 0436   VLDL 19  07/17/2020 0436  [place] 63 06/22/2021 1108    Physical Exam:    VS:  BP (!) 148/70   Pulse (!) 54   Ht 5' 3"$  (1.6 m)   Wt 130 lb 12.8 oz (59.3 kg)   SpO2 98%   BMI 23.17 kg/m     Wt Readings from Last 3 Encounters:  07/19/22 130 lb 12.8 oz (59.3 kg)  05/17/22 131 lb 6.4 oz (59.6 kg)  05/05/22 129 lb 3.2 oz (58.6 kg)     GEN:  Well nourished, well developed in no acute distress CARDIAC: RRR, no murmurs, rubs, gallops RESPIRATORY:  Clear to auscultation without rales, wheezing or rhonchi         ASSESSMENT:    1. PAF (paroxysmal atrial fibrillation) ([place])   2. Hemorrhagic stroke ([place])   3. Presence of Watchman left atrial appendage closure device   4. Encounter for long-term (current) use of high-risk medication    PLAN:    In order of problems listed above:  #Persistent AF Continue amiodarone 251m PO daily Continue aspirin 841mPO daily  #Watchman in situ Cont aspirin given watchman in situ  #Amiodarone monitoring Liver/thyroid function ok on blood work this month Plan for return OV and blood work in 6 months  #Hypertension Slightly above goal today.  She has a blood pressure cuff at home but she has not been checking it routinely.  Recommend checking blood pressures 1-2 times per week at home and recording the values.  Recommend bringing these recordings to the primary care physician.  Follow up 6 months with APP.    Medication Adjustments/Labs and Tests Ordered: Current medicines are reviewed at length with the patient today.  Concerns regarding medicines are outlined above.  No orders of the defined types were placed in this encounter.  No orders of the defined types were placed in this encounter.    Signed, CaLars MageMD, [facility] [place]/21/2024 10:45 AM    Electrophysiology [facility]

## 2022-07-19 ENCOUNTER — Ambulatory Visit: Payer: Medicare Other | Attending: Cardiology | Admitting: Cardiology

## 2022-07-19 ENCOUNTER — Encounter: Payer: Self-pay | Admitting: Cardiology

## 2022-07-19 VITALS — BP 148/70 | HR 54 | Ht 63.0 in | Wt 130.8 lb

## 2022-07-19 DIAGNOSIS — Z95818 Presence of other cardiac implants and grafts: Secondary | ICD-10-CM | POA: Diagnosis not present

## 2022-07-19 DIAGNOSIS — I480 Paroxysmal atrial fibrillation: Secondary | ICD-10-CM

## 2022-07-19 DIAGNOSIS — I619 Nontraumatic intracerebral hemorrhage, unspecified: Secondary | ICD-10-CM | POA: Diagnosis not present

## 2022-07-19 DIAGNOSIS — Z79899 Other long term (current) drug therapy: Secondary | ICD-10-CM | POA: Diagnosis not present

## 2022-07-19 NOTE — Patient Instructions (Signed)
Medication Instructions:  Your physician recommends that you continue on your current medications as directed. Please refer to the Current Medication list given to you today.  *If you need a refill on your cardiac medications before your next appointment, please call your pharmacy*  Follow-Up: At [facility], you and your health needs are our priority.  As part of our continuing mission to provide you with exceptional heart care, we have created designated Provider Care Teams.  These Care Teams include your primary Cardiologist (physician) and Advanced Practice Providers (APPs -  Physician Assistants and Nurse Practitioners) who all work together to provide you with the care you need, when you need it.  Your next appointment:   6 month(s)  Provider:   You will see one of the following Advanced Practice Providers on your designated Care Team:   Renee Ursuy, PA-C Michael "Andy" Tillery, PA-C Suzann Riddle, NP    

## 2022-07-28 ENCOUNTER — Emergency Department: Payer: Medicare Other

## 2022-07-28 ENCOUNTER — Emergency Department
Admission: EM | Admit: 2022-07-28 | Discharge: 2022-07-28 | Disposition: A | Discharge status: Home / Self Care | Payer: Medicare Other | Attending: Student in an Organized Health Care Education/Training Program | Admitting: Student in an Organized Health Care Education/Training Program

## 2022-07-28 ENCOUNTER — Other Ambulatory Visit: Payer: Self-pay

## 2022-07-28 DIAGNOSIS — W010XXA Fall on same level from slipping, tripping and stumbling without subsequent striking against object, initial encounter: Secondary | ICD-10-CM | POA: Insufficient documentation

## 2022-07-28 DIAGNOSIS — W19XXXA Unspecified fall, initial encounter: Secondary | ICD-10-CM

## 2022-07-28 DIAGNOSIS — S60912A Unspecified superficial injury of left wrist, initial encounter: Secondary | ICD-10-CM | POA: Diagnosis present

## 2022-07-28 DIAGNOSIS — Z7982 Long term (current) use of aspirin: Secondary | ICD-10-CM | POA: Insufficient documentation

## 2022-07-28 DIAGNOSIS — Z79899 Other long term (current) drug therapy: Secondary | ICD-10-CM | POA: Insufficient documentation

## 2022-07-28 DIAGNOSIS — S0083XA Contusion of other part of head, initial encounter: Secondary | ICD-10-CM | POA: Diagnosis not present

## 2022-07-28 DIAGNOSIS — S52572A Other intraarticular fracture of lower end of left radius, initial encounter for closed fracture: Secondary | ICD-10-CM | POA: Diagnosis not present

## 2022-07-28 MED ORDER — HYDROCODONE-ACETAMINOPHEN 5-325 MG PO TABS
1.0000 | ORAL_TABLET | ORAL | Status: AC
  Administered 2022-07-28: 1 via ORAL
  Filled 2022-07-28: qty 1

## 2022-07-28 MED ORDER — HYDROCODONE-ACETAMINOPHEN 5-325 MG PO TABS: 1.0000 | ORAL_TABLET | Freq: Four times a day (QID) | ORAL | 0 refills | Status: DC | PRN

## 2022-07-28 MED ORDER — BUPIVACAINE HCL (PF) 0.5 % IJ SOLN
5.0000 mL | Freq: Once | INTRAMUSCULAR | Status: AC
  Administered 2022-07-28: 5 mL
  Filled 2022-07-28: qty 10

## 2022-07-28 NOTE — ED Notes (Signed)
Report given to Alex RN.

## 2022-07-28 NOTE — ED Notes (Signed)
Patient removed wedding ring and gave it to family member at bedside.

## 2022-07-28 NOTE — ED Triage Notes (Addendum)
Pt c/o L wrist injury/pain and L head pain r/t a trip and fall this afternoon.  Pain score 7/10 increasing w/ movement.  Deformity noted to L wrist.  Hematoma noted to L forehead.     Denies LOC.  No blood thinners.

## 2022-07-28 NOTE — ED Provider Notes (Signed)
Sandra [name]   CSN: [id number] Arrival date & time: 07/28/22  1756     History  Chief Complaint  Patient presents with   Fall   Wrist Injury   Head Injury    Sandra [name] is a 77 y.o. female.  Presents to the emergency department valuation of a fall that occurred earlier today.  Patient states she slipped on wet grass, she fell, injured her left wrist and hit her left forehead.  She denies any LOC, nausea or vomiting.  She denies any neck pain chest pain shortness of breath or abdominal pain.  No lower extremity discomfort.  She has a deformity to the left wrist with mild numbness to the tips of the digits of the left hand.  She has a slight headache focal to the left forehead.  Patient takes aspirin daily.  HPI     Home Medications Prior to Admission medications   Medication Sig Start Date End Date Taking? Authorizing Provider  HYDROcodone-acetaminophen (NORCO) 5-325 MG tablet Take 1 tablet by mouth every 6 (six) hours as needed for moderate pain. 07/28/22  Yes Duanne Guess, PA-C  acetaminophen (TYLENOL) 325 MG tablet Take 2 tablets (650 mg total) by mouth every 6 (six) hours as needed for mild pain (or Fever >/= 101). 04/03/22   Rai, Vernelle Emerald, MD  ALPRAZolam Duanne Moron) 0.5 MG tablet Take 1 tablet (0.5 mg total) by mouth at bedtime as needed. for sleep 03/24/22   Birdie Sons, MD  amiodarone (PACERONE) 200 MG tablet Take 200 mg by mouth daily.    [provider]  aspirin EC 81 MG tablet Take 1 tablet (81 mg total) by mouth daily. Swallow whole. 07/27/21   Vickie Epley, MD  atorvastatin (LIPITOR) 40 MG tablet TAKE 1 TABLET BY MOUTH DAILY 09/02/21   Rise Mu, PA-C  bisacodyl 5 MG EC tablet Take 5 mg by mouth daily as needed for moderate constipation.    [provider]  Calcium Carb-Cholecalciferol (CALCIUM 600 + D PO) Take 1 tablet by mouth daily.    [provider]  Cholecalciferol  (VITAMIN D3) 125 MCG (5000 UT) TABS Take 5,000 Units by mouth daily.    [provider]  docusate sodium (COLACE) 100 MG capsule Take 100 mg by mouth daily as needed for mild constipation.    [provider]  hydroxypropyl methylcellulose / hypromellose (ISOPTO TEARS / GONIOVISC) 2.5 % ophthalmic solution Place 1 drop into both eyes 4 (four) times daily as needed for dry eyes.    [provider]  iron polysaccharides (NIFEREX) 150 MG capsule Take 1 capsule (150 mg total) by mouth daily. Or can take any over-the-counter iron supplement. 04/09/22   Enzo Bi, MD  latanoprost (XALATAN) 0.005 % ophthalmic solution Place 1 drop into both eyes at bedtime.    [provider]  levothyroxine (SYNTHROID) 100 MCG tablet TAKE 1 TABLET BY MOUTH 30  MINUTES BEFORE BREAKFAST 02/02/22   Eulas Post, MD  metoprolol succinate (TOPROL-XL) 25 MG 24 hr tablet TAKE ONE-HALF TABLET BY MOUTH  DAILY AT BEDTIME 07/04/22   Hammock, Barbera Setters, NP  Multiple Vitamin (MULTIVITAMIN WITH MINERALS) TABS tablet Take 1 tablet by mouth daily at 3 pm.    [provider]  omeprazole (PRILOSEC) 40 MG capsule TAKE 1 CAPSULE BY MOUTH DAILY 02/02/22   Eulas Post, MD  potassium chloride (KLOR-CON) 20 MEQ packet Take 2 packets (40 meq) by mouth  once daily 05/18/22   Vickie Epley, MD  timolol (BETIMOL) 0.5 % ophthalmic solution Place 1 drop into both eyes 2 (two) times daily.    [provider]      Allergies    Tape    Review of Systems   Review of Systems  Physical Exam Updated Vital Signs BP (!) 189/73   Pulse 63   Temp (!) 97.5 F (36.4 C) (Oral)   Resp 17   Ht '5\' 3"'$  (1.6 m)   Wt 59 kg   SpO2 97%   BMI 23.03 kg/m  Physical Exam Constitutional:      Appearance: She is well-developed.  HENT:     Head: Normocephalic.     Comments: Small nontender slightly swollen hematoma left forehead with mild ecchymosis with no skin breakdown noted Eyes:     Extraocular  Movements: Extraocular movements intact.     Conjunctiva/sclera: Conjunctivae normal.     Pupils: Pupils are equal, round, and reactive to light.  Cardiovascular:     Rate and Rhythm: Normal rate.  Pulmonary:     Effort: Pulmonary effort is normal. No respiratory distress.  Abdominal:     General: Bowel sounds are normal. There is no distension.     Tenderness: There is no abdominal tenderness. There is no right CVA tenderness, left CVA tenderness or guarding.  Musculoskeletal:        General: Normal range of motion.     Cervical back: Normal range of motion.     Comments: No cervical thoracic or lumbar spinous process tenderness.  Patient ambulatory in the room with no antalgic gait no assistive devices with no dizziness or lightheadedness.  Patient has a deformity to the left wrist with swelling noted throughout the wrist.  Sensation is intact throughout the hand and digits.  There is no skin breakdown noted.  She has 2+ radial pulse and normal cap refill.  She is able to move the digits and make a full composite fist.  Skin:    General: Skin is warm.     Findings: No rash.  Neurological:     General: No focal deficit present.     Mental Status: She is alert and oriented to person, place, and time. Mental status is at baseline.     Cranial Nerves: No cranial nerve deficit.     Motor: No weakness.     Gait: Gait normal.  Psychiatric:        Mood and Affect: Mood normal.        Behavior: Behavior normal.        Thought Content: Thought content normal.     ED Results / Procedures / Treatments   Labs (all labs ordered are listed, but only abnormal results are displayed) Labs Reviewed - No data to display  EKG None  Radiology DG Wrist 2 Views Left  Result Date: 07/28/2022 CLINICAL DATA:  Left wrist pain/fall, post reduction EXAM: LEFT WRIST - 2 VIEW COMPARISON:  07/28/2022 at 1828 hours FINDINGS: Comminuted distal radial fracture with improved alignment status post reduction.  Ulnar styloid fracture, minimally displaced. Overlying cast obscures fine osseous detail. IMPRESSION: Distal radial and ulnar fractures, as above. Electronically Signed   By: Julian Hy M.D.   On: 07/28/2022 20:22   CT HEAD WO CONTRAST (5MM)  Result Date: 07/28/2022 CLINICAL DATA:  Trauma. EXAM: CT HEAD WITHOUT CONTRAST CT CERVICAL SPINE WITHOUT CONTRAST TECHNIQUE: Multidetector CT imaging of the head and cervical spine was performed following the  standard protocol without intravenous contrast. Multiplanar CT image reconstructions of the cervical spine were also generated. RADIATION DOSE REDUCTION: This exam was performed according to the departmental dose-optimization program which includes automated exposure control, adjustment of the mA and/or kV according to patient size and/or use of iterative reconstruction technique. COMPARISON:  Head CT dated 04/02/2022. FINDINGS: Evaluation of this exam is limited due to motion artifact. CT HEAD FINDINGS Brain: Mild age-related atrophy and chronic microvascular ischemic changes. There is no acute intracranial hemorrhage. No mass effect midline shift. No extra-axial fluid collection. Vascular: No hyperdense vessel or unexpected calcification. Skull: Normal. Negative for fracture or focal lesion. Sinuses/Orbits: No acute finding. Other: Left forehead contusion and hematoma. CT CERVICAL SPINE FINDINGS Alignment: No acute subluxation. There is mild reversal of normal cervical lordosis which may be positional or due to muscle spasm. Skull base and vertebrae: No acute fracture. Soft tissues and spinal canal: No prevertebral fluid or swelling. No visible canal hematoma. Disc levels:  No acute findings.  Degenerative changes. Upper chest: Emphysema. Other: None IMPRESSION: 1. No acute intracranial pathology. Mild age-related atrophy and chronic microvascular ischemic changes. 2. No acute/traumatic cervical spine pathology. Electronically Signed   By: Anner Crete M.D.    On: 07/28/2022 18:58   CT Cervical Spine Wo Contrast  Result Date: 07/28/2022 CLINICAL DATA:  Trauma. EXAM: CT HEAD WITHOUT CONTRAST CT CERVICAL SPINE WITHOUT CONTRAST TECHNIQUE: Multidetector CT imaging of the head and cervical spine was performed following the standard protocol without intravenous contrast. Multiplanar CT image reconstructions of the cervical spine were also generated. RADIATION DOSE REDUCTION: This exam was performed according to the departmental dose-optimization program which includes automated exposure control, adjustment of the mA and/or kV according to patient size and/or use of iterative reconstruction technique. COMPARISON:  Head CT dated 04/02/2022. FINDINGS: Evaluation of this exam is limited due to motion artifact. CT HEAD FINDINGS Brain: Mild age-related atrophy and chronic microvascular ischemic changes. There is no acute intracranial hemorrhage. No mass effect midline shift. No extra-axial fluid collection. Vascular: No hyperdense vessel or unexpected calcification. Skull: Normal. Negative for fracture or focal lesion. Sinuses/Orbits: No acute finding. Other: Left forehead contusion and hematoma. CT CERVICAL SPINE FINDINGS Alignment: No acute subluxation. There is mild reversal of normal cervical lordosis which may be positional or due to muscle spasm. Skull base and vertebrae: No acute fracture. Soft tissues and spinal canal: No prevertebral fluid or swelling. No visible canal hematoma. Disc levels:  No acute findings.  Degenerative changes. Upper chest: Emphysema. Other: None IMPRESSION: 1. No acute intracranial pathology. Mild age-related atrophy and chronic microvascular ischemic changes. 2. No acute/traumatic cervical spine pathology. Electronically Signed   By: Anner Crete M.D.   On: 07/28/2022 18:58   DG Wrist Complete Left  Result Date: 07/28/2022 CLINICAL DATA:  Trauma, fall EXAM: LEFT WRIST - COMPLETE 3+ VIEW COMPARISON:  Left hand radiographs done on  04/16/2012 FINDINGS: There is comminuted fracture in the distal metaphyseal region of radius. There is overriding of fracture fragments. There is volar angulation at the fracture site. There is posterior and lateral displacement of distal major fracture fragment in the radius. There is deformity in the base of the ulnar styloid without demonstrable radiolucent line. As far as seen, there is no demonstrable dislocation in the radiocarpal joint. IMPRESSION: Comminuted, angulated and displaced fracture is seen in the distal metaphyseal region of left radius. Mild deformity seen in base of ulnar styloid may be due to recent or old fracture. Electronically Signed  By: Elmer Picker M.D.   On: 07/28/2022 18:40    Procedures .Ortho Injury Treatment  Date/Time: 07/28/2022 8:54 PM  Performed by: Duanne Guess, PA-C Authorized by: Duanne Guess, PA-C   Consent:    Consent obtained:  Verbal   Consent given by:  Patient   Alternatives discussed:  No treatmentInjury location: wrist Location details: left wrist Injury type: fracture Fracture type: distal radius Pre-procedure neurovascular assessment: neurovascularly intact Anesthesia: hematoma block  Anesthesia: Local anesthesia used: yes Local Anesthetic: bupivacaine 0.5% without epinephrine Anesthetic total: 10 mL  Patient sedated: NoManipulation performed: yes Skin traction used: yes Skeletal traction used: yes Reduction successful: yes X-ray confirmed reduction: yes Immobilization: splint Splint type: long arm and sugar tong Splint Applied by: ED Provider Supplies used: cotton padding, elastic bandage and Ortho-Glass Post-procedure neurovascular assessment: post-procedure neurovascularly intact Post-procedure distal perfusion: normal Post-procedure neurological function: normal Post-procedure range of motion: normal Comments: Neurovascular intact postreduction with no numbness or tingling reported on history or exam.  Normal cap  refill.  Pain significantly improved       Medications Ordered in ED Medications  HYDROcodone-acetaminophen (NORCO/VICODIN) 5-325 MG per tablet 1 tablet (1 tablet Oral Given 07/28/22 1904)  bupivacaine(PF) (MARCAINE) 0.5 % injection 5 mL (5 mLs Infiltration Given 07/28/22 1946)    ED Course/ Medical Decision Making/ A&P                             Medical Decision Making Amount and/or Complexity of Data Reviewed Radiology: ordered.  Risk Prescription drug management.   77 year old female with displaced comminuted intra-articular distal radial fracture.  She underwent closed reduction after hematoma block with sugar-tong splint.  Postreduction films show significant improvement alignment.  She is neurovascular intact postreduction and pain well-controlled.  She is placed into a sling and she is given Norco for pain.  She will follow-up with orthopedics Monday.  Patient also with hematoma to the left forehead, no new neurological deficits.  CT of the head and neck negative.  No photophobia, dizziness, lightheadedness.  Patient educated on head injuries and signs and symptoms return to the ER for. Final Clinical Impression(s) / ED Diagnoses Final diagnoses:  Other closed intra-articular fracture of distal end of left radius, initial encounter  Traumatic hematoma of forehead, initial encounter  Fall, initial encounter    Rx / DC Orders ED Discharge Orders          Ordered    HYDROcodone-acetaminophen (NORCO) 5-325 MG tablet  Every 6 hours PRN        07/28/22 2051              Renata Caprice 07/28/22 2056    Merlyn Lot, MD 07/30/22 [phone]

## 2022-07-28 NOTE — ED Notes (Signed)
Patient is back from CT.

## 2022-07-28 NOTE — Discharge Instructions (Signed)
Please call orthopedic office Monday to schedule follow-up appointment.  Keep splint on at all times, keep clean and dry.  Use sling as needed for comfort.  Take pain medication, Norco as prescribed as needed for severe pain.  For mild to moderate pain you may take 400 mg of ibuprofen 3 times a day with food for up to 5 days as needed.  Return to the ER for any severe pain numbness tingling worsening symptoms or any urgent changes in your health.

## 2022-07-31 ENCOUNTER — Other Ambulatory Visit: Payer: Self-pay | Admitting: Orthopedic Surgery

## 2022-08-01 ENCOUNTER — Encounter: Payer: Self-pay | Admitting: Orthopedic Surgery

## 2022-08-04 ENCOUNTER — Other Ambulatory Visit: Payer: Self-pay

## 2022-08-04 ENCOUNTER — Ambulatory Visit
Admission: RE | Admit: 2022-08-04 | Discharge: 2022-08-04 | Disposition: A | Discharge status: Home / Self Care | Payer: Medicare Other | Source: Ambulatory Visit | Attending: Orthopedic Surgery | Admitting: Orthopedic Surgery

## 2022-08-04 ENCOUNTER — Encounter: Payer: Self-pay | Admitting: Orthopedic Surgery

## 2022-08-04 ENCOUNTER — Ambulatory Visit: Payer: Self-pay

## 2022-08-04 ENCOUNTER — Encounter
Admission: RE | Disposition: A | Discharge status: Home / Self Care | Payer: Self-pay | Source: Ambulatory Visit | Attending: Orthopedic Surgery

## 2022-08-04 ENCOUNTER — Ambulatory Visit: Payer: Medicare Other | Admitting: Anesthesiology

## 2022-08-04 DIAGNOSIS — Z90710 Acquired absence of both cervix and uterus: Secondary | ICD-10-CM | POA: Insufficient documentation

## 2022-08-04 DIAGNOSIS — I4891 Unspecified atrial fibrillation: Secondary | ICD-10-CM | POA: Diagnosis not present

## 2022-08-04 DIAGNOSIS — Z9011 Acquired absence of right breast and nipple: Secondary | ICD-10-CM | POA: Insufficient documentation

## 2022-08-04 DIAGNOSIS — E039 Hypothyroidism, unspecified: Secondary | ICD-10-CM | POA: Diagnosis not present

## 2022-08-04 DIAGNOSIS — W19XXXA Unspecified fall, initial encounter: Secondary | ICD-10-CM | POA: Insufficient documentation

## 2022-08-04 DIAGNOSIS — Z8249 Family history of ischemic heart disease and other diseases of the circulatory system: Secondary | ICD-10-CM | POA: Diagnosis not present

## 2022-08-04 DIAGNOSIS — I10 Essential (primary) hypertension: Secondary | ICD-10-CM | POA: Diagnosis not present

## 2022-08-04 DIAGNOSIS — M1990 Unspecified osteoarthritis, unspecified site: Secondary | ICD-10-CM | POA: Diagnosis not present

## 2022-08-04 DIAGNOSIS — S52532A Colles' fracture of left radius, initial encounter for closed fracture: Secondary | ICD-10-CM | POA: Insufficient documentation

## 2022-08-04 DIAGNOSIS — Z853 Personal history of malignant neoplasm of breast: Secondary | ICD-10-CM | POA: Insufficient documentation

## 2022-08-04 HISTORY — PX: OPEN REDUCTION INTERNAL FIXATION (ORIF) DISTAL RADIAL FRACTURE: SHX5989

## 2022-08-04 SURGERY — OPEN REDUCTION INTERNAL FIXATION (ORIF) DISTAL RADIUS FRACTURE
Anesthesia: General | Site: Arm Lower | Laterality: Left

## 2022-08-04 MED ORDER — FENTANYL CITRATE (PF) 100 MCG/2ML IJ SOLN
INTRAMUSCULAR | Status: DC | PRN
  Administered 2022-08-04: 75 ug via INTRAVENOUS

## 2022-08-04 MED ORDER — OXYCODONE HCL 5 MG/5ML PO SOLN: 5.0000 mg | Freq: Once | ORAL | Status: DC | PRN

## 2022-08-04 MED ORDER — HYDROCODONE-ACETAMINOPHEN 5-325 MG PO TABS: 1.0000 | ORAL_TABLET | Freq: Four times a day (QID) | ORAL | 0 refills | Status: DC | PRN

## 2022-08-04 MED ORDER — OXYCODONE HCL 5 MG PO TABS: 5.0000 mg | ORAL_TABLET | Freq: Once | ORAL | Status: DC | PRN

## 2022-08-04 MED ORDER — DEXAMETHASONE SODIUM PHOSPHATE 4 MG/ML IJ SOLN
INTRAMUSCULAR | Status: DC | PRN
  Administered 2022-08-04: 4 mg via INTRAVENOUS

## 2022-08-04 MED ORDER — LACTATED RINGERS IV SOLN: INTRAVENOUS | Status: DC

## 2022-08-04 MED ORDER — CEFAZOLIN SODIUM-DEXTROSE 2-4 GM/100ML-% IV SOLN
2.0000 g | INTRAVENOUS | Status: AC
  Administered 2022-08-04: 2 g via INTRAVENOUS

## 2022-08-04 MED ORDER — MIDAZOLAM HCL 5 MG/5ML IJ SOLN
INTRAMUSCULAR | Status: DC | PRN
  Administered 2022-08-04: 2 mg via INTRAVENOUS

## 2022-08-04 MED ORDER — GLYCOPYRROLATE 0.2 MG/ML IJ SOLN
INTRAMUSCULAR | Status: DC | PRN
  Administered 2022-08-04 (×2): .1 mg via INTRAVENOUS

## 2022-08-04 MED ORDER — BUPIVACAINE HCL (PF) 0.5 % IJ SOLN
INTRAMUSCULAR | Status: DC | PRN
  Administered 2022-08-04: 10 mL

## 2022-08-04 MED ORDER — PROPOFOL 10 MG/ML IV BOLUS
INTRAVENOUS | Status: DC | PRN
  Administered 2022-08-04: 140 mg via INTRAVENOUS

## 2022-08-04 MED ORDER — ONDANSETRON HCL 4 MG/2ML IJ SOLN
INTRAMUSCULAR | Status: DC | PRN
  Administered 2022-08-04: 4 mg via INTRAVENOUS

## 2022-08-04 MED ORDER — 0.9 % SODIUM CHLORIDE (POUR BTL) OPTIME
TOPICAL | Status: DC | PRN
  Administered 2022-08-04: 500 mL

## 2022-08-04 MED ORDER — FENTANYL CITRATE PF 50 MCG/ML IJ SOSY: 25.0000 ug | PREFILLED_SYRINGE | INTRAMUSCULAR | Status: DC | PRN

## 2022-08-04 SURGICAL SUPPLY — 33 items
APL PRP STRL LF DISP 70% ISPRP (MISCELLANEOUS) ×1
BIT DRILL 2 FAST STEP (BIT) IMPLANT
BIT DRILL 2.5X4 QC (BIT) IMPLANT
CHLORAPREP W/TINT 26 (MISCELLANEOUS) ×1 IMPLANT
COVER LIGHT HANDLE UNIVERSAL (MISCELLANEOUS) ×2 IMPLANT
CUFF TOURN SGL QUICK 18X4 (TOURNIQUET CUFF) IMPLANT
DRAPE FLUOR MINI C-ARM 54X84 (DRAPES) ×1 IMPLANT
ELECT REM PT RETURN 9FT ADLT (ELECTROSURGICAL) ×1
ELECTRODE REM PT RTRN 9FT ADLT (ELECTROSURGICAL) ×1 IMPLANT
GAUZE SPONGE 4X4 12PLY STRL (GAUZE/BANDAGES/DRESSINGS) ×1 IMPLANT
GAUZE XEROFORM 1X8 LF (GAUZE/BANDAGES/DRESSINGS) ×2 IMPLANT
GLOVE SURG SYN 9.0  PF PI (GLOVE) ×1
GLOVE SURG SYN 9.0 PF PI (GLOVE) ×1 IMPLANT
GOWN STRL REUS W/ TWL LRG LVL3 (GOWN DISPOSABLE) ×1 IMPLANT
GOWN STRL REUS W/TWL LRG LVL3 (GOWN DISPOSABLE) ×1
K-WIRE 1.6 (WIRE) ×1
K-WIRE FX5X1.6XNS BN SS (WIRE) ×1
KIT TURNOVER KIT A (KITS) ×1 IMPLANT
KWIRE FX5X1.6XNS BN SS (WIRE) IMPLANT
NS IRRIG 500ML POUR BTL (IV SOLUTION) ×1 IMPLANT
PACK EXTREMITY ARMC (MISCELLANEOUS) ×1 IMPLANT
PADDING CAST BLEND 4X4 STRL (MISCELLANEOUS) ×2 IMPLANT
PEG SUBCHONDRAL SMOOTH 2.0X14 (Peg) IMPLANT
PEG SUBCHONDRAL SMOOTH 2.0X16 (Peg) IMPLANT
PEG SUBCHONDRAL SMOOTH 2.0X18 (Peg) IMPLANT
PLATE SHORT 21.6X48.9 NRRW LT (Plate) IMPLANT
SCREW CORT 3.5X10 LNG (Screw) IMPLANT
SPLINT CAST 1 STEP 3X12 (MISCELLANEOUS) ×1 IMPLANT
SUT ETHILON 4-0 (SUTURE) ×1
SUT ETHILON 4-0 FS2 18XMFL BLK (SUTURE) ×1
SUT VICRYL 3-0 27IN (SUTURE) ×1 IMPLANT
SUTURE ETHLN 4-0 FS2 18XMF BLK (SUTURE) ×1 IMPLANT
SYR 3ML LL SCALE MARK (SYRINGE) ×1 IMPLANT

## 2022-08-04 NOTE — Op Note (Signed)
08/04/2022  1:08 PM  PATIENT:  Sandra [name]  77 y.o. female  PRE-OPERATIVE DIAGNOSIS:  Closed Colles' fracture of left radius, initial encounter S52.532A  POST-OPERATIVE DIAGNOSIS:  Closed Colles' fracture of left radius, initial encounter S52.532A  PROCEDURE:  Procedure(s): OPEN REDUCTION INTERNAL FIXATION (ORIF) DISTAL RADIUS FRACTURE (Left)  SURGEON: Laurene Footman, MD  ASSISTANTS: None  ANESTHESIA:   general  EBL:  Total I/O In: 500 [I.V.:500] Out: 20 [Blood:20]  BLOOD ADMINISTERED:none  DRAINS: none   LOCAL MEDICATIONS USED:  MARCAINE     SPECIMEN:  No Specimen  DISPOSITION OF SPECIMEN:  N/A  COUNTS:  YES  TOURNIQUET:   Total Tourniquet Time Documented: Upper Arm (Left) - 17 minutes Total: Upper Arm (Left) - 17 minutes   IMPLANTS: [employer] short narrow DVR plate with multiple pegs and screws  DICTATION: .Dragon Dictation  patient was brought to the operating room and after the arm was prepped and draped in the usual sterile fashion after general anesthesia was obtained,, tourniquet having been applied to the upper arm.  Appropriate patient identification and timeout procedures were completed.  Tourniquet was raised and a volar approach was made after traction was applied at the end of the table through fingertrap traction.  Approach is center of the FCR tendon.  Tendon sheath incised and the tendon retracted radially protect the radial artery and associated veins.  The deep fascia was incised and the pronator was elevated off the distal and proximal fragments with exposure of the fracture site.  The fracture was extra-articular and with traction applied and Freer elevator adequate alignment was obtained.  A short standard DVR plate applied with distal first technique using a K wire to hold the plate in position position of the plate was checked in both AP and lateral projections.  When acceptable position was obtained the smooth pegs were placed drilling  measuring and placing the smooth pegs in the distal fragment along with multiple multidirectional screws to make sure there is adequate fixation in the distal fragment, following this the shaft portion of the plate was brought down to the bone and 3 10 mm cortical screws were sequentially inserted they gave an essentially anatomic alignment in both AP and lateral projections.  Traction was removed and under fluoroscopic views there is no motion of the fracture.   At this point local anesthetic was infiltrated proximal to the skin incision and the wound irrigated with tourniquet let down.  Wound was then closed with 3-0 Vicryl for the deep tissue and a running 4-0 Monocryl subcuticular closure followed by Dermabond.  Dressing of 4 x 4's web roll and volar splint followed by Ace wrap applied.    PLAN OF CARE: Discharge to home after PACU  PATIENT DISPOSITION:  PACU - hemodynamically stable.

## 2022-08-04 NOTE — Anesthesia Procedure Notes (Signed)
Procedure Name: LMA Insertion Date/Time: 08/04/2022 12:25 PM  Performed by: Moises Blood, CRNAPre-anesthesia Checklist: Patient identified, Emergency Drugs available, Patient being monitored, Suction available and Timeout performed Patient Re-evaluated:Patient Re-evaluated prior to induction Oxygen Delivery Method: Circle system utilized Preoxygenation: Pre-oxygenation with 100% oxygen Induction Type: IV induction LMA: LMA inserted LMA Size: 3.0 Number of attempts: 1 Placement Confirmation: positive ETCO2 and breath sounds checked- equal and bilateral Dental Injury: Teeth and Oropharynx as per pre-operative assessment

## 2022-08-04 NOTE — H&P (Signed)
Chief Complaint Patient presents with Left Wrist - Follow-up, Pain   History of the Present Illness: Sandra [name] is a 77 y.o. female here today for evaluation of left wrist fracture.  She was previously treated for a right distal radius fracture. She is accompanied by an adult female.  She had another fall last Friday, 07/28/2022 evening around 5:15 PM. She went to the mailbox and on the way back from the mailbox, she jumped on the concrete and injured herself. She had some tingling sensation for a while, but not currently. She has a history of osteoporosis and administers extra calcium. She wears a splint.  I have reviewed past medical, surgical, social and family history, and allergies as documented in the EMR.  Past Medical History: Past Medical History: Diagnosis Date Anemia Anxiety disorder Atrial fibrillation, new onset (CMS-HCC) 2021 BCC (basal cell carcinoma of skin) Breast cancer, right (CMS-HCC) 10/16/2016 T1c,N0 (1.6 cm); ER/PR +; Her 2 neu not overexpressed. BCI testing January 06, 2022: No benefit from extended antiestrogen therapy. CAD (coronary artery disease) non-obstructive Cardiac murmur Celiac disease Chronic constipation 04/15/2015 Colon polyp GERD (gastroesophageal reflux disease) H/O adenomatous polyp of colon 04/15/2015 History of stroke 06/2020 Hypertension Hypothyroidism (acquired) Osteoarthritis Prediabetes Raynaud's disease RLS (restless legs syndrome)  Past Surgical History: Past Surgical History: Procedure Laterality Date COLONOSCOPY 04/29/1986 [facility] Rectal Adenomatous Polyp seen on Flex Sig COLONOSCOPY 11/15/1989 [place] Adenomatous Polyp EGD 08/06/1995 COLONOSCOPY 08/06/1995 [facility] Adenomatous Polyp COLONOSCOPY 12/04/2003 Adenomatous Polyps EGD 01/12/2006 COLONOSCOPY 01/12/2006 Adenomatous Polyps EGD 04/22/2012 COLONOSCOPY 04/22/2012 Adenomatous Polyps: CBF 03/2015; Recall Ltr mailed 03/03/2015 (dw) COLONOSCOPY 05/19/2015 Adenomatous  Polyps: CBF 04/2018 MASTECTOMY SIMPLE Right 11/07/2016 with axillary SLN biopsy COLONOSCOPY 12/06/2017 Adenomatous Polyps: CBF 11/2019 EGD 12/06/2017 Gastritis: No repeat per RTE COLONOSCOPY 08/01/2019 [place] atrial appendage occlusion Left 01/27/2021 tee with cardioversion 01/27/2021 ORIF DISTAL RADIUS FRACTURE Right 04/11/2022 Dr. Micah Flesher ORIF Rt Wrist Fracture Right 04/11/2022 Dr. Hessie Knows ABDOMINAL HYSTERECTOMY APPENDECTOMY CATARACT EXTRACTION Bilateral CESAREAN SECTION COLONOSCOPY hand surgery Left POLYPECTOMY REPEAT CESAREAN SECTION TONSILLECTOMY age 89 UPPER GASTROINTESTINAL ENDOSCOPY VITREOUS RETINAL SURGERY  Past Family History: Family History Problem Relation Age of Onset Lymphoma Mother Anemia Mother Alcohol abuse Father Prostate cancer Father Cancer Sister bile duct Breast cancer Sister 64 half sister; shared mother Stroke Maternal Grandmother Breast cancer Maternal Grandmother 68 Myocardial Infarction (Heart attack) Maternal Grandfather Coronary Artery Disease (Blocked arteries around heart) Paternal Grandfather Headaches Daughter Arthritis Son  Medications: Current Outpatient Medications Ordered in Epic Medication Sig Dispense Refill acetaminophen (TYLENOL) 325 MG tablet Take 2 tablets by mouth every 6 (six) hours as needed for Pain ALPRAZolam (XANAX) 0.5 MG tablet Take 1 tablet by mouth nightly as needed. AMIOdarone (PACERONE) 200 MG tablet Take 1 tablet by mouth once daily artificial tears (GONIOSCOP-HPM) 2.5 % ophthalmic solution Apply to eye aspirin 81 MG EC tablet Take 81 mg by mouth once daily atorvastatin (LIPITOR) 40 MG tablet Take 1 tablet by mouth once daily bisacodyL (DULCOLAX) 5 mg EC tablet Take 5 mg by mouth once daily as needed for Constipation calcium carbonate 600 mg (1,500 mg) Tab tablet Take by mouth. cholecalciferol (VITAMIN D3) 400 unit tablet Take by mouth. docusate (COLACE) 100 MG capsule Take 100 mg by mouth once  daily as needed HYDROcodone-acetaminophen (NORCO) 5-325 mg tablet Take 1 tablet by mouth every 6 (six) hours as needed for Pain latanoprost (XALATAN) 0.005 % ophthalmic solution 1 drop nightly levothyroxine (SYNTHROID, LEVOTHROID) 100 MCG tablet Take 1 tablet by mouth every morning. metoprolol succinate (TOPROL-XL) 25 MG  XL tablet Take 12.5 mg by mouth once daily multivitamin tablet Take 1 tablet by mouth once daily omega-3 fatty acids-fish oil 300-1,000 mg capsule Take by mouth omeprazole (PRILOSEC) 40 MG DR capsule Take 40 mg by mouth once daily polyethylene glycol (MIRALAX) packet Take 17 g by mouth once daily Mix in 4-8ounces of fluid prior to taking. potassium chloride (KLOR-CON) 20 MEQ ER tablet Take 1 tablet by mouth once daily sennosides-docusate (SENOKOT-S) 8.6-50 mg tablet Take by mouth as needed sertraline (ZOLOFT) 100 MG tablet Take 1 tablet by mouth once daily. simethicone (MYLICON) 80 MG chewable tablet Take 80 mg by mouth every 6 (six) hours as needed for Flatulence timolol hemihydrate (BETIMOL) 0.5 % ophthalmic solution Place 1 drop into both eyes 2 (two) times daily timoloL maleate (TIMOPTIC) 0.5 % ophthalmic solution dilTIAZem (CARDIZEM CD) 180 MG CD capsule Take 1 capsule by mouth once daily  No current Epic-ordered facility-administered medications on file.  Allergies: Allergies Allergen Reactions Diphenhydramine Hcl Other (See Comments) Ibuprofen Rash CAN TAKE NAPROSYN Loratadine Rash   Body mass index is 23.34 kg/m.  Review of Systems: A comprehensive 14 point ROS was performed, reviewed, and the pertinent orthopaedic findings are documented in the HPI.  Vitals: 07/31/22 1436 BP: 136/78   General Physical Examination:  General/Constitutional: No apparent distress: well-nourished and well developed. Eyes: Pupils equal, round with synchronous movement. Lungs: Clear to auscultation HEENT: Normal Vascular: No edema, swelling or tenderness, except as  noted in detailed exam. Cardiac: Heart rate and rhythm is regular. Integumentary: No impressive skin lesions present, except as noted in detailed exam. Neuro/Psych: Normal mood and affect, oriented to person, place, and time.  On exam, she does have ecchymosis to the left side of her face that she reports is improving.  Radiographs:  No new imaging studies were obtained or reviewed today.  Assessment: ICD-10-CM 1. Closed Colles' fracture of left radius, initial encounter S52.532A 2. Aortic atherosclerosis (CMS-HCC) I70.0  Plan:  The patient has clinical findings of left wrist fracture.  She does have ecchymosis to the left side of her face that she reports is improving. She will have ORIF with volar plate of the left wrist later in the week. She has been through this before and understands risks, benefits, and possible complications.  She is scheduled to have ORIF with volar plate of the left wrist on Friday, 08/04/2022.  Surgical Risks:  The nature of the condition and the proposed procedure has been reviewed in detail with the patient. Surgical versus non-surgical options and prognosis for recovery have been reviewed and the inherent risks and benefits of each have been discussed including the risks of infection, bleeding, injury to nerves/blood vessels/tendons, incomplete relief of symptoms, persisting pain and/or stiffness, loss of function, complex regional pain syndrome, failure of the procedure, as appropriate.  Document Attestation: I, Farber, have reviewed and updated documentation for [facility], MD, utilizing [employer] DAX.   Electronically signed by Lauris Poag, MD at 08/01/2022 8:27 AM EST   Reviewed  H+P. No changes noted.

## 2022-08-04 NOTE — Transfer of Care (Signed)
Immediate Anesthesia Transfer of Care Note  Patient: Sandra [name]  Procedure(s) Performed: OPEN REDUCTION INTERNAL FIXATION (ORIF) DISTAL RADIUS FRACTURE (Left: Arm Lower)  Patient Location: PACU  Anesthesia Type: General  Level of Consciousness: awake, alert  and patient cooperative  Airway and Oxygen Therapy: Patient Spontanous Breathing and Patient connected to supplemental oxygen  Post-op Assessment: Post-op Vital signs reviewed, Patient's Cardiovascular Status Stable, Respiratory Function Stable, Patent Airway and No signs of Nausea or vomiting  Post-op Vital Signs: Reviewed and stable  Complications: No notable events documented.

## 2022-08-04 NOTE — Discharge Instructions (Signed)
Keep arm elevated is much as possible through the weekend Keep dressing clean and dry Work on finger motion all you can Pain medicine as directed Ice to the back of the wrist may help with pain and swelling for the next 2 days Call office if you are having problems  [phone]

## 2022-08-04 NOTE — Anesthesia Procedure Notes (Signed)
Anesthesia Regional Block: Supraclavicular block   Pre-Anesthetic Checklist: , timeout performed,  Correct Patient, Correct Site, Correct Laterality,  Correct Procedure, Correct Position, site marked,  Risks and benefits discussed,  Surgical consent,  Pre-op evaluation,  At surgeon's request and post-op pain management  Laterality: Left  Prep: alcohol swabs       Needles:  Injection technique: Single-shot  Needle Type: Stimiplex     Needle Length: 5cm  Needle Gauge: 22     Additional Needles:   Procedures:, nerve stimulator,,, ultrasound used (permanent image in chart),,     Nerve Stimulator or Paresthesia:  Response: biceps flexion  Additional Responses:   Narrative:  Start time: 08/04/2022 12:10 PM End time: 08/04/2022 12:15 PM Injection made incrementally with aspirations every 5 mL.  Performed by: Personally   Additional Notes: Functioning IV was confirmed and monitors were applied.  A 61m 22ga Stimuplex needle was used. Sterile prep and drape,hand hygiene and sterile gloves were used.  Negative aspiration and negative test dose prior to incremental administration of 20cc of 0.5% bupivacaine local anesthetic. The patient tolerated the procedure well.

## 2022-08-04 NOTE — Anesthesia Preprocedure Evaluation (Signed)
Anesthesia Evaluation  Patient identified by MRN, date of birth, ID band Patient awake    Reviewed: Allergy & Precautions, H&P , NPO status , Patient's Chart, lab work & pertinent test results, reviewed documented beta blocker date and time   History of Anesthesia Complications Negative for: history of anesthetic complications  Airway Mallampati: I  TM Distance: >3 FB Neck ROM: full    Dental  (+) Dental Advidsory Given, Poor Dentition   Pulmonary neg pulmonary ROS, shortness of breath and with exertion, neg sleep apnea, pneumonia, resolved, neg COPD, Recent URI , Resolved   Pulmonary exam normal        Cardiovascular Exercise Tolerance: Good hypertension, On Medications (-) angina + CAD  (-) Past MI negative cardio ROS Normal cardiovascular exam+ dysrhythmias Atrial Fibrillation + Valvular Problems/Murmurs  Rhythm:regular Rate:Normal     Neuro/Psych  Headaches, neg Seizures PSYCHIATRIC DISORDERS Anxiety Depression    CVA (dizziness), Residual Symptoms negative neurological ROS  negative psych ROS   GI/Hepatic negative GI ROS, Neg liver ROS,GERD  Medicated,,  Endo/Other  negative endocrine ROSneg diabetesHypothyroidism    Renal/GU negative Renal ROS  negative genitourinary   Musculoskeletal   Abdominal   Peds  Hematology negative hematology ROS (+) Blood dyscrasia, anemia   Anesthesia Other Findings Past Medical History: No date: Abnormality of left ventricle of heart     Comment:  MILD-MODERATE THICKENING PER ECHO ON 01-18-17 No date: Anemia No date: Anxiety No date: Arthritis 10/2016: BRCA gene mutation negative     Comment:  NEGATIVE: [employer] 10/16/2016: Breast cancer ([place])     Comment:  T1c,N0;, GRADE I/III, 1.6cm. ER/PR pos  HER2 not over               expressed, Right Upper Outer No date: Celiac disease No date: Depression No date: Diastolic dysfunction No date: Dyspnea     Comment:  WITH EXERTION No  date: GERD (gastroesophageal reflux disease)     Comment:  OCC No date: Glaucoma     Comment:  right eye No date: Headache     Comment:  H/O MIGRAINES AS TEENAGER No date: Heart murmur     Comment:  PT WAS TOLD ONCE SHE HAD MVP AND THEN ANOTHER MD SAID               SHE DID NOT HAVE MVP No date: Hypothyroidism No date: Pneumonia     Comment:  YEARS AGO No date: Raynaud's disease No date: RLS (restless legs syndrome) Past Surgical History: No date: ABDOMINAL HYSTERECTOMY No date: APPENDECTOMY 03/05/2017: AXILLARY LYMPH NODE BIOPSY; Right     Comment:  Procedure: AXILLARY LYMPH NODE BIOPSY;  Surgeon:               Robert Bellow, MD;  Location: [facility] ORS;  Service:               General;  Laterality: Right; No date: CATARACT EXTRACTION; Bilateral No date: CESAREAN SECTION 05/19/2015: COLONOSCOPY WITH PROPOFOL; N/A     Comment:  Procedure: COLONOSCOPY WITH PROPOFOL;  Surgeon: Manya Silvas, MD;  Location: [facility] ENDOSCOPY;  Service:               Endoscopy;  Laterality: N/A; No date: EYE SURGERY No date: HAND SURGERY; Left 11/07/2016: MASTECTOMY; Right     Comment:  RESIDUAL INVASIVE MAMMARY CARCINOMA, SUBAREOLAR ANTERIOR  TO PREVIOUS  11/07/2016: SENTINEL NODE BIOPSY; Right     Comment:  Procedure: SENTINEL NODE BIOPSY;  Surgeon: Robert Bellow, MD;  Location: [facility] ORS;  Service: General;                Laterality: Right; 11/07/2016: SIMPLE MASTECTOMY WITH AXILLARY SENTINEL NODE BIOPSY; Right     Comment:  6 mm ER/PR 100%; Her 2 neu not overexpressed, T1b, N0.                Surgeon: Robert Bellow, MD;  Location: [facility] ORS;                Service: General;  Laterality: Right; AGE 77: TONSILLECTOMY 01/12/06: UPPER GI ENDOSCOPY     Comment:  hiatus hernia   Reproductive/Obstetrics negative OB ROS                             Anesthesia Physical Anesthesia Plan  ASA: 3  Anesthesia Plan: General    Post-op Pain Management: Regional block   Induction: Intravenous  PONV Risk Score and Plan: 3 and Ondansetron, Dexamethasone and Treatment may vary due to age or medical condition  Airway Management Planned: LMA  Additional Equipment:   Intra-op Plan:   Post-operative Plan: Extubation in OR  Informed Consent: I have reviewed the patients History and Physical, chart, labs and discussed the procedure including the risks, benefits and alternatives for the proposed anesthesia with the patient or authorized representative who has indicated his/her understanding and acceptance.     Dental Advisory Given  Plan Discussed with: Anesthesiologist, CRNA and Surgeon  Anesthesia Plan Comments: (Patient consented for risks of anesthesia including but not limited to:  - adverse reactions to medications - damage to eyes, teeth, lips or other oral mucosa - nerve damage due to positioning  - sore throat or hoarseness - Damage to heart, brain, nerves, lungs, other parts of body or loss of life  Patient voiced understanding.)        Anesthesia Quick Evaluation

## 2022-08-04 NOTE — Anesthesia Postprocedure Evaluation (Signed)
Anesthesia Post Note  Patient: Sandra [name]  Procedure(s) Performed: OPEN REDUCTION INTERNAL FIXATION (ORIF) DISTAL RADIUS FRACTURE (Left: Arm Lower)  Patient location during evaluation: PACU Anesthesia Type: General Level of consciousness: awake and alert Pain management: pain level controlled Vital Signs Assessment: post-procedure vital signs reviewed and stable Respiratory status: spontaneous breathing, nonlabored ventilation, respiratory function stable and patient connected to nasal cannula oxygen Cardiovascular status: blood pressure returned to baseline and stable Postop Assessment: no apparent nausea or vomiting Anesthetic complications: no  No notable events documented.   Last Vitals:  Vitals:   08/04/22 1312 08/04/22 1315  BP: (!) 172/71 (!) 164/73  Pulse: (!) 59 (!) 59  Resp: 14 12  Temp: (!) 36.2 C (!) 36.2 C  SpO2: 91% 98%    Last Pain:  Vitals:   08/04/22 1315  TempSrc:   PainSc: 0-No pain                 Dimas Millin

## 2022-08-04 NOTE — Progress Notes (Signed)
[facility] with left, interscalene , ultrasound guided block. Side rails up, monitors on throughout procedure. See vital signs in flow sheet. Tolerated Procedure well.

## 2022-08-07 ENCOUNTER — Encounter: Payer: Self-pay | Admitting: Orthopedic Surgery

## 2022-08-09 ENCOUNTER — Encounter: Payer: Self-pay | Admitting: Orthopedic Surgery

## 2022-08-09 NOTE — OR Nursing (Signed)
Entered chart to correct implant information.

## 2022-08-25 ENCOUNTER — Encounter: Payer: Self-pay | Admitting: Cardiology

## 2022-08-25 ENCOUNTER — Ambulatory Visit: Payer: Medicare Other | Attending: Cardiology | Admitting: Cardiology

## 2022-08-25 VITALS — BP 138/72 | HR 48 | Ht 63.0 in | Wt 129.8 lb

## 2022-08-25 DIAGNOSIS — I10 Essential (primary) hypertension: Secondary | ICD-10-CM

## 2022-08-25 DIAGNOSIS — I5032 Chronic diastolic (congestive) heart failure: Secondary | ICD-10-CM

## 2022-08-25 DIAGNOSIS — I480 Paroxysmal atrial fibrillation: Secondary | ICD-10-CM

## 2022-08-25 DIAGNOSIS — I2510 Atherosclerotic heart disease of native coronary artery without angina pectoris: Secondary | ICD-10-CM | POA: Diagnosis not present

## 2022-08-25 DIAGNOSIS — Z95818 Presence of other cardiac implants and grafts: Secondary | ICD-10-CM

## 2022-08-25 DIAGNOSIS — R296 Repeated falls: Secondary | ICD-10-CM

## 2022-08-25 DIAGNOSIS — R001 Bradycardia, unspecified: Secondary | ICD-10-CM

## 2022-08-25 NOTE — Progress Notes (Signed)
Cardiology Office Note:   Date:  08/25/2022  ID:  Sandra [name], DOB 03-19-1946, MRN [id number]  History of Present Illness:   Sandra [name] is a 77 y.o. female with a past medical history paroxysmal atrial fibrillation complicated by intracranial hemorrhage in 06/2018 in the setting of recurrent falls s/p watchman implant in 01/27/21, HFpEF, right-sided breast cancer, celiac disease, Raynaud's disease, hypothyroidism, restless leg syndrome, gastroesophageal reflux disease, anxiety, who presents today for follow-up on her paroxysmal atrial fibrillation chronic HFpEF.  She was last seen in clinic 08/17/2022 by EP, Dr. [name].  At that she was feeling well lower incident of atrial fibrillation was continued on amiodarone 200 mg daily and aspirin for stroke prophylaxis with previous history of watchman implant.  No changes made to her medication or further testing that was ordered.  Patient was hospitalized 07/28/2022 status post mechanical fall with an injury to her wrist and forehead. She had slipped on wet grass this and did not do any loss of consciousness.  Patient unfortunately ended up being scheduled for an ORIF of with volar plate of the left wrist.  Procedure was complicated as she was unable to base on the same day.  She returns to clinic today stating that she has been doing fairly well.  She continues to be in a brace on her left wrist.  She states that his sutures were removed and Steri-Strips were applied.  She did have videos of her fall she had gone in the right to check the mailbox misstep donated the driveway and lost her footing when facedown and hit her forehead on the driveway and broke her wrist with displaced bones.  Bruising on her forehead and face have improved.  She is no longer taking pain medication.  Overall from the cardiac standpoint she has been doing well.  She denies any chest pain, shortness of breath, palpitations, peripheral edema, dizziness/lightheadedness.  ROS: 10  point review of systems is negative  Studies Reviewed:    EKG: Sinus bradycardia with PAC, LVH with early repolarization, left axis deviation, ST depression and T wave inversion V3 through V6  Heart monitor 04/27/22   The patient was monitored for 13 days, 23 hours.   The predominant rhythm was sinus with an average rate of 70 bpm (range 50-93 bpm in sinus).   There were rare PAC's and PVC's.   A single episode of nonsustained ventricular tachycardia occurred, lasting 4 beats with a maximum rate of 115 bpm.   There were 20 supraventricular runs, lasting up to 1 min, 37 seconds with a maximum rate of 176 bpm.  The longest episode appears irregularly irregular and most likely represents atrial fibrillation with rapid ventricular response.   No prolonged pause was observed.   Patient triggered events correspond to sinus rhythm.   Predominantly sinus rhythm with multiple supraventricular runs, as detailed above.  At least one episode labeled as SVT is suspicious for atrial fibrillation with rapid ventricular response.  TTE 04/22/22 1. Left ventricular ejection fraction, by estimation, is 60 to 65%. The  left ventricle has normal function. The left ventricle has no regional  wall motion abnormalities. There is mild left ventricular hypertrophy.  Left ventricular diastolic parameters  are indeterminate.   2. Right ventricular systolic function is normal. The right ventricular  size is normal.   3. Left atrial size was moderately dilated.   4. The mitral valve is normal in structure. Mild mitral valve  regurgitation.   5. The aortic valve is  tricuspid. Aortic valve regurgitation is not  visualized.   6. The inferior vena cava is normal in size with greater than 50%  respiratory variability, suggesting right atrial pressure of 3 mmHg.    Risk Assessment/Calculations:    CHA2DS2-VASc Score = 5   This indicates a 7.2% annual risk of stroke. The patient's score is based upon: CHF History:  0 HTN History: 1 Diabetes History: 0 Stroke History: 0 Vascular Disease History: 1 Age Score: 2 Gender Score: 1             Physical Exam:   VS:  BP 138/72 (BP Location: Left Arm, Patient Position: Sitting, Cuff Size: Normal)   Pulse (!) 48   Ht 5\' 3"  (1.6 m)   Wt 129 lb 12.8 oz (58.9 kg)   SpO2 98%   BMI 22.99 kg/m    Wt Readings from Last 3 Encounters:  08/25/22 129 lb 12.8 oz (58.9 kg)  08/04/22 132 lb 3.3 oz (60 kg)  07/28/22 130 lb (59 kg)     GEN: Well nourished, well developed in no acute distress. Fading bruising to the left side of the face. NECK: No JVD; No carotid bruits CARDIAC: RRR, bradycardiac, I/VI systolic murmur, without rubs, gallops RESPIRATORY:  Clear to auscultation without rales, wheezing or rhonchi  ABDOMEN: Soft, non-tender, non-distended EXTREMITIES:  No edema; No deformity   ASSESSMENT AND PLAN:   Paroxysmal atrial fibrillation currently in sinus bradycardia to sinus rhythm on EKG today.  She continues to follow-up with the EP on an outpatient.  She is currently on amiodarone 200 mg daily and Toprol-XL 12.5 mg at at bedtime.  She states she has not had any further episodes of atrial fibrillation or SVT.  Patient is status post watchman and is continued on aspirin 81 mg daily.  Essential hypertension with blood pressure 150/66 with repeat blood pressure of 138/82 which is stable.  She is continued on Toprol-XL.  She is also encouraged to continue to monitor pressures at home.  Nonobstructive coronary artery disease noted on prior coronary CTA.  She is continued on aspirin and atorvastatin to prevent progression of mild disease.  She currently denies any chest pain no changes noted on EKG today.  Chronic HFpEF last echocardiogram completed at hospitalization which revealed LVEF of 60 to 65%,.  She is euvolemic today with no symptoms of decompensation.  Frequent recurrent falls with history of intracranial hemorrhage 2022.  She also just recently had  a closed displaced fracture of the left wrist that required ORIF with volar plate and continues to be in a brace today.  She continues to follow-up with Ortho.  Sinus bradycardia rate of 48 noted on EKG.  Continued strips revealed patient was 50-54.  HR increased with movement heart rate increased into the 60s.  Will continue to monitor to see if beta-blocker therapy needs to be discontinued.  Disposition patient to return to clinic to see MD/APP in 6 months or sooner if needed.  She has been advised to maintain her currently scheduled follow-up appointment with her primary cardiologist Dr. [name] and EP.        Signed, Chamar Broughton, NP

## 2022-08-25 NOTE — Patient Instructions (Addendum)
Medication Instructions:  No changes at this time.   *If you need a refill on your cardiac medications before your next appointment, please call your pharmacy*   Lab Work: None  If you have labs (blood work) drawn today and your tests are completely normal, you will receive your results only by: [place] (if you have MyChart) OR A paper copy in the mail If you have any lab test that is abnormal or we need to change your treatment, we will call you to review the results.   Testing/Procedures: None   Follow-Up: At [facility], you and your health needs are our priority.  As part of our continuing mission to provide you with exceptional heart care, we have created designated Provider Care Teams.  These Care Teams include your primary Cardiologist (physician) and Advanced Practice Providers (APPs -  Physician Assistants and Nurse Practitioners) who all work together to provide you with the care you need, when you need it.   KEEP scheduled appointment on 09/21/22 at 1:20 pm with Dr. [name].   Your next appointment:   6 month(s)  Provider:   Nelva Bush, MD or Gerrie Nordmann, NP

## 2022-08-28 ENCOUNTER — Other Ambulatory Visit: Payer: Self-pay | Admitting: Cardiology

## 2022-08-28 NOTE — Addendum Note (Signed)
Addended by: James Ivanoff D on: 08/28/2022 12:06 PM   Modules accepted: Orders

## 2022-08-29 NOTE — Telephone Encounter (Signed)
Please advise if ok to refill historical provider. 

## 2022-08-31 NOTE — Telephone Encounter (Signed)
Per last office visit on 08/25/22, Sandra Nordmann, NP recommended pt continue amiodarone 200 mg daily. Refill sent to requested pharmacy

## 2022-09-07 ENCOUNTER — Other Ambulatory Visit: Payer: Self-pay | Admitting: Cardiology

## 2022-09-17 ENCOUNTER — Other Ambulatory Visit: Payer: Self-pay | Admitting: Physician Assistant

## 2022-09-21 ENCOUNTER — Ambulatory Visit: Payer: Medicare Other | Attending: Internal Medicine | Admitting: Internal Medicine

## 2022-09-21 ENCOUNTER — Encounter: Payer: Self-pay | Admitting: Internal Medicine

## 2022-09-21 VITALS — BP 120/62 | HR 43 | Ht 63.0 in | Wt 129.0 lb

## 2022-09-21 DIAGNOSIS — I5032 Chronic diastolic (congestive) heart failure: Secondary | ICD-10-CM

## 2022-09-21 DIAGNOSIS — I480 Paroxysmal atrial fibrillation: Secondary | ICD-10-CM | POA: Diagnosis not present

## 2022-09-21 DIAGNOSIS — I495 Sick sinus syndrome: Secondary | ICD-10-CM | POA: Diagnosis not present

## 2022-09-21 NOTE — Progress Notes (Signed)
Follow-up Outpatient Visit Date: 09/21/2022  Primary Care Provider: Louis Matte, MD [address] [place] [address]  Chief Complaint: Follow-up atrial fibrillation  HPI:  Sandra [name] is a 77 y.o. female with history of paroxysmal atrial fibrillation complicated by intracranial hemorrhage in 06/2020 in the setting of recurrent falls status post Watchman implantation, HFpEF, right-sided breast cancer, celiac disease, Raunaud's disease, hypothyroidism, RLS, GERD, and anxiety, who presents for follow-up of paroxysmal atrial fibrillation and HFpEF.  She was last seen in our office a month ago by Charlsie Quest, NP, at which time she was doing fairly well.  No medication changes or additional testing were pursued.  Today, Sandra [name] reports that she is doing fairly well though she notes another fall in March leading to a wrist fracture (this time on the left).  Her gait remains unsteady though she denies dizziness and loss of consciousness.  She does not know exactly why she keeps falling.  She also has not had any palpitations, chest pain, or shortness of breath.  --------------------------------------------------------------------------------------------------  Past Medical History:  Diagnosis Date   (HFpEF) heart failure with preserved ejection fraction    a. 08/2019 Echo: EF 60-65%, no rwma, mild LVH, Gr2 DD, nl RV fxn. Nl pASP. Mildly dil LA. Mild to mod MR.   Anemia    Anxiety    Arthritis    BRCA gene mutation negative 10/2016   NEGATIVE: [facility]   Breast cancer 10/16/2016   T1c,N0;, GRADE I/III, 1.6cm. ER/PR pos  HER2 not over expressed, Right Upper Outer   Celiac disease    Celiac syndrome    Cerebellar hemorrhage, acute 07/17/2020   Depression    Dyspnea    WITH EXERTION   Fatty liver    GERD (gastroesophageal reflux disease)    OCC   Glaucoma    right eye   Headache    H/O MIGRAINES AS TEENAGER.   Heart murmur    a. 08/2019 Echo: mild to mod MR.   Hypertension     Hypothyroidism    Non-obstructive CAD (coronary artery disease)    a. 01/2017 MV: Hypertensive response. No ischemia/infarct. EF >65%; b. 09/2019 Cor CTA: Ca2+ = 9.29 (36th percentile). LAD calcified plaque (0-24%), otw nl. Multipel bilat pulm nodules up to 7mm.   Osteoporosis    PAF (paroxysmal atrial fibrillation)    a. 02/2020 Zio: predominantly RSR, 65 (50-105), rare PACs/PVCs, 8 beats NSVT, multiple episodes of PAF lasting up to 1hr . Avg AF rate 130 (93-170). AF burden <1%. Triggered events = RSR, PACs, and PAF; b. CHA2DS2VASc = 6.   Pneumonia    YEARS AGO   Pre-diabetes    Presence of Watchman left atrial appendage closure device 01/2021   Pulmonary nodules    a. 09/2019 Cor CTA: incidental finding of multiple bilat pulm nodules; b. 12/2019 High Res CT: stable, scattered solid pulm nodules.   Raynaud's disease    RLS (restless legs syndrome)    Stroke 06/2020   mild balance issues   Wears hearing aid in both ears    Past Surgical History:  Procedure Laterality Date   ABDOMINAL HYSTERECTOMY     APPENDECTOMY     AXILLARY LYMPH NODE BIOPSY Right 03/05/2017   Procedure: AXILLARY LYMPH NODE BIOPSY;  Surgeon: Earline Mayotte, MD;  Location: [facility] ORS;  Service: General;  Laterality: Right;   CATARACT EXTRACTION Bilateral    CESAREAN SECTION     COLONOSCOPY WITH PROPOFOL N/A 05/19/2015   Procedure: COLONOSCOPY WITH  PROPOFOL;  Surgeon: Scot Jun, MD;  Location: [facility] ENDOSCOPY;  Service: Endoscopy;  Laterality: N/A;   COLONOSCOPY WITH PROPOFOL N/A 08/01/2019   Procedure: COLONOSCOPY WITH PROPOFOL;  Surgeon: Midge Minium, MD;  Location[facility] ENDOSCOPY;  Service: Endoscopy;  Laterality: N/A;   EYE SURGERY     HAND SURGERY Left    LEFT ATRIAL APPENDAGE OCCLUSION N/A 01/27/2021   Procedure: LEFT ATRIAL APPENDAGE OCCLUSION;  Surgeon: Lanier Prude, MD;  Location: [facility] INVASIVE CV LAB;  Service: Cardiovascular;  Laterality: N/A;   MASTECTOMY Right 11/07/2016   RESIDUAL  INVASIVE MAMMARY CARCINOMA, SUBAREOLAR ANTERIOR TO PREVIOUS    OPEN REDUCTION INTERNAL FIXATION (ORIF) DISTAL RADIAL FRACTURE Right 04/11/2022   Procedure: OPEN REDUCTION INTERNAL FIXATION (ORIF) DISTAL RADIUS FRACTURE;  Surgeon: Kennedy Bucker, MD;  Location: [facility] SURGERY CNTR;  Service: Orthopedics;  Laterality: Right;   OPEN REDUCTION INTERNAL FIXATION (ORIF) DISTAL RADIAL FRACTURE Left 08/04/2022   Procedure: OPEN REDUCTION INTERNAL FIXATION (ORIF) DISTAL RADIUS FRACTURE;  Surgeon: Kennedy Bucker, MD;  Location: [facility] SURGERY CNTR;  Service: Orthopedics;  Laterality: Left;   SENTINEL NODE BIOPSY Right 11/07/2016   Procedure: SENTINEL NODE BIOPSY;  Surgeon: Earline Mayotte, MD;  Location: [facility] ORS;  Service: General;  Laterality: Right;   SIMPLE MASTECTOMY WITH AXILLARY SENTINEL NODE BIOPSY Right 11/07/2016   6 mm ER/PR 100%; Her 2 neu not overexpressed, T1b, N0.  Surgeon: Earline Mayotte, MD;  Location: [facility] ORS;  Service: General;  Laterality: Right;   TEE WITHOUT CARDIOVERSION N/A 01/27/2021   Procedure: TRANSESOPHAGEAL ECHOCARDIOGRAM (TEE);  Surgeon: Lanier Prude, MD;  Location: [facility] INVASIVE CV LAB;  Service: Cardiovascular;  Laterality: N/A;   TEE WITHOUT CARDIOVERSION N/A 03/10/2021   Procedure: TRANSESOPHAGEAL ECHOCARDIOGRAM (TEE);  Surgeon: Parke Poisson, MD;  Location: [facility] ENDOSCOPY;  Service: Cardiology;  Laterality: N/A;   TONSILLECTOMY  AGE 77   UPPER GI ENDOSCOPY  01/12/06   hiatus hernia    Current Meds  Medication Sig   ALPRAZolam (XANAX) 0.5 MG tablet Take 1 tablet (0.5 mg total) by mouth at bedtime as needed. for sleep   amiodarone (PACERONE) 200 MG tablet TAKE 1 TABLET BY MOUTH ONCE  DAILY   aspirin EC 81 MG tablet Take 1 tablet (81 mg total) by mouth daily. Swallow whole.   atorvastatin (LIPITOR) 40 MG tablet TAKE 1 TABLET BY MOUTH DAILY   bisacodyl 5 MG EC tablet Take 5 mg by mouth daily as needed for moderate constipation.   Calcium Carb-Cholecalciferol  (CALCIUM 600 + D PO) Take 1 tablet by mouth daily.   Cholecalciferol (VITAMIN D3) 125 MCG (5000 UT) TABS Take 5,000 Units by mouth daily.   docusate sodium (COLACE) 100 MG capsule Take 100 mg by mouth daily as needed for mild constipation.   hydroxypropyl methylcellulose / hypromellose (ISOPTO TEARS / GONIOVISC) 2.5 % ophthalmic solution Place 1 drop into both eyes 4 (four) times daily as needed for dry eyes.   iron polysaccharides (NIFEREX) 150 MG capsule Take 1 capsule (150 mg total) by mouth daily. Or can take any over-the-counter iron supplement.   latanoprost (XALATAN) 0.005 % ophthalmic solution Place 1 drop into both eyes at bedtime.   levothyroxine (SYNTHROID) 100 MCG tablet TAKE 1 TABLET BY MOUTH 30  MINUTES BEFORE BREAKFAST (Patient taking differently: Take by mouth. TAKE 1 TABLET BY MOUTH 30  MINUTES BEFORE BREAKFAST)   metoprolol succinate (TOPROL-XL) 25 MG 24 hr tablet TAKE ONE-HALF TABLET BY MOUTH  DAILY AT BEDTIME   Multiple Vitamin (  MULTIVITAMIN WITH MINERALS) TABS tablet Take 1 tablet by mouth daily at 3 pm.   omeprazole (PRILOSEC) 40 MG capsule TAKE 1 CAPSULE BY MOUTH DAILY   potassium chloride (KLOR-CON) 20 MEQ packet Take 2 packets (40 meq) by mouth once daily   sertraline (ZOLOFT) 100 MG tablet Take 100 mg by mouth daily.   timolol (BETIMOL) 0.5 % ophthalmic solution Place 1 drop into both eyes 2 (two) times daily.    Allergies: Patient has no known allergies.  Social History   Tobacco Use   Smoking status: Never   Smokeless tobacco: Never  Vaping Use   Vaping Use: Never used  Substance Use Topics   Alcohol use: Not Currently   Drug use: No    Family History  Problem Relation Age of Onset   Cancer Mother    Anemia Mother    Other Mother        lymphosarcoma   Alcohol abuse Father    Prostate cancer Father    Breast cancer Sister        48; 1/2 sister, shared mother.   Cancer Sister        bile duct   Stroke Maternal Grandmother    Breast cancer Maternal  Grandmother 68   Heart attack Maternal Grandfather    CAD Paternal Grandfather     Review of Systems: A 12-system review of systems was performed and was negative except as noted in the HPI.  --------------------------------------------------------------------------------------------------  Physical Exam: BP 120/62 (BP Location: Left Arm, Patient Position: Sitting, Cuff Size: Normal)   Pulse (!) 43   Ht 5\' 3"  (1.6 m)   Wt 129 lb (58.5 kg)   SpO2 98%   BMI 22.85 kg/m   General:  NAD. Neck: No JVD or HJR. Lungs: Clear to auscultation bilaterally without wheezes or crackles. Heart: Irregularly irregular rhythm without murmurs. Abdomen: Soft, nontender, nondistended. Extremities: No lower extremity edema.  EKG: Initial tracing demonstrates marked sinus bradycardia with PACs, LVH with diffuse ST/T abnormalities most consistent with abnormal repolarization, and possible septal infarct.  Repeat tracing a few minutes later demonstrates atrial fibrillation with ventricular rate of 97 bpm.  Lab Results  Component Value Date   WBC 4.9 04/23/2022   HGB 11.3 (L) 04/23/2022   HCT 35.8 (L) 04/23/2022   MCV 86.1 04/23/2022   PLT 349 04/23/2022    Lab Results  Component Value Date   NA 136 07/12/2022   K 4.2 07/12/2022   CL 102 07/12/2022   CO2 27 07/12/2022   BUN 14 07/12/2022   CREATININE 0.76 07/12/2022   GLUCOSE 109 (H) 07/12/2022   ALT 43 07/12/2022    Lab Results  Component Value Date   CHOL 138 06/22/2021   HDL 52 06/22/2021   LDLCALC 63 06/22/2021   TRIG 128 06/22/2021   CHOLHDL 2.7 06/22/2021    --------------------------------------------------------------------------------------------------  ASSESSMENT AND PLAN: Paroxysmal atrial fibrillation, tachy-brady syndrome, and recurrent falls: Sandra [name] reports feeling fairly well though she had another serious fall a few weeks ago.  She believes that it was due to her leg giving out and not dizziness/syncope, though  on further questioning, she does not have a great recollection of the episode.  Given marked bradycardia while in sinus rhythm today followed by atrial fibrillation with borderline elevated heart rates shortly thereafter, I am concerned that tachybradycardia syndrome may be contributing to Sandra [name] falls.  She previously saw Dr. Lalla Brothers for [facility] implantation; we will try to arrange for extra but I did  consultation with him for consideration of ILR placement versus permanent pacing.  Given her marked bradycardia when in sinus rhythm today, I have recommended discontinuation of metoprolol.  We will continue amiodarone.  Continue to defer anticoagulation in the setting of recurrent falls complicated by intracranial hemorrhage and multiple fractures status post Watchman.  Chronic HFpEF: Sandra [name] appears euvolemic today without heart failure symptoms.  No further intervention at this time.  Follow-up: To be determined based on EP evaluation.  Yvonne Kendall, MD 09/21/2022 1:38 PM

## 2022-09-21 NOTE — Patient Instructions (Signed)
Medication Instructions:  Stop taking Metoprolol   *If you need a refill on your cardiac medications before your next appointment, please call your pharmacy*   Lab Work: No labs ordered today   Testing/Procedures: No testing ordered today   Follow-Up: At [facility], you and your health needs are our priority.  As part of our continuing mission to provide you with exceptional heart care, we have created designated Provider Care Teams.  These Care Teams include your primary Cardiologist (physician) and Advanced Practice Providers (APPs -  Physician Assistants and Nurse Practitioners) who all work together to provide you with the care you need, when you need it.  We recommend signing up for the patient portal called "MyChart".  Sign up information is provided on this After Visit Summary.  MyChart is used to connect with patients for Virtual Visits (Telemedicine).  Patients are able to view lab/test results, encounter notes, upcoming appointments, etc.  Non-urgent messages can be sent to your provider as well.   To learn more about what you can do with MyChart, go to [email or web address].    Your next appointment:   1 week(s)  Provider:   Steffanie Dunn, MD

## 2022-09-23 ENCOUNTER — Encounter: Payer: Self-pay | Admitting: Internal Medicine

## 2022-09-23 DIAGNOSIS — I495 Sick sinus syndrome: Secondary | ICD-10-CM | POA: Insufficient documentation

## 2022-09-27 ENCOUNTER — Ambulatory Visit: Payer: Medicare Other | Attending: Cardiology | Admitting: Cardiology

## 2022-09-27 ENCOUNTER — Encounter: Payer: Self-pay | Admitting: Cardiology

## 2022-09-27 VITALS — BP 140/70 | HR 56 | Ht 63.0 in | Wt 129.0 lb

## 2022-09-27 DIAGNOSIS — I495 Sick sinus syndrome: Secondary | ICD-10-CM | POA: Diagnosis not present

## 2022-09-27 DIAGNOSIS — I480 Paroxysmal atrial fibrillation: Secondary | ICD-10-CM | POA: Diagnosis not present

## 2022-09-27 DIAGNOSIS — Z95818 Presence of other cardiac implants and grafts: Secondary | ICD-10-CM

## 2022-09-27 DIAGNOSIS — R296 Repeated falls: Secondary | ICD-10-CM

## 2022-09-27 NOTE — Patient Instructions (Addendum)
Medication Instructions:  Your physician recommends that you continue on your current medications as directed. Please refer to the Current Medication list given to you today.  *If you need a refill on your cardiac medications before your next appointment, please call your pharmacy*  Testing/Procedures: You will have a loop recorder implanted at your next office visit. There are not restrictions or special instructions prior to this visit.   Follow-Up: At [facility], you and your health needs are our priority.  As part of our continuing mission to provide you with exceptional heart care, we have created designated Provider Care Teams.  These Care Teams include your primary Cardiologist (physician) and Advanced Practice Providers (APPs -  Physician Assistants and Nurse Practitioners) who all work together to provide you with the care you need, when you need it.  Your next appointment:   May 22nd at 4:20pm  Provider:   Steffanie Dunn, MD

## 2022-09-27 NOTE — Progress Notes (Signed)
  Electrophysiology Office Follow up Visit Note:    Date:  09/27/2022   ID:  Sandra [name], DOB September 01, 1945, MRN [id number]  PCP:  Louis Matte, MD  [facility] Cardiologist:  Yvonne Kendall, MD  [facility] Electrophysiologist:  Lanier Prude, MD    Interval History:    Sandra [name] is a 77 y.o. female who presents for a follow up visit.   She has a prior watchman implant January 27, 2021.  She had been doing well after this implant.  She has had several falls recently including 1 in November and another in March.  In November she fell while stepping off a curb and fractured her right wrist requiring surgical repair.  In March she fell while coming back from the mailbox and again fell onto an outstretched arm.  This time is her left arm but it did result in another fracture requiring surgical repair.  This episode was captured on her ring doorbell camera.  There is no loss of consciousness during either episode.  These were mechanical falls.  She has not had a abrupt loss of consciousness but is concerned that she may be experiencing arrhythmias that are predisposing her to gait instability.       Past medical, surgical, social and family history were reviewed.  ROS:   Please see the history of present illness.    All other systems reviewed and are negative.  EKGs/Labs/Other Studies Reviewed:    The following studies were reviewed today:    Physical Exam:    VS:  BP (!) 140/70   Pulse (!) 56   Ht 5\' 3"  (1.6 m)   Wt 129 lb (58.5 kg)   SpO2 98%   BMI 22.85 kg/m     Wt Readings from Last 3 Encounters:  09/27/22 129 lb (58.5 kg)  09/21/22 129 lb (58.5 kg)  08/25/22 129 lb 12.8 oz (58.9 kg)     GEN: Well nourished, well developed in no acute distress CARDIAC: RRR, no murmurs, rubs, gallops RESPIRATORY:  Clear to auscultation without rales, wheezing or rhonchi       ASSESSMENT:    1. PAF (paroxysmal atrial fibrillation) (HCC)   2.  Tachycardia-bradycardia syndrome (HCC)   3. Presence of Watchman left atrial appendage closure device   4. Falls frequently    PLAN:    In order of problems listed above:  #Atrial fibrillation #Watchman device in situ #High risk medication use-amiodarone Controlled on amiodarone 200 mg by mouth once daily On aspirin 81 mg by mouth once daily.  Watchman device is in situ with a well sealed left atrial appendage  #Falls Given her history of tachybradycardia syndrome, there is a chance that her frequent falls are at least related to an arrhythmia.  Possibly postconversion pauses or significant bradycardia is worsening her gait instability.  We discussed options for further evaluation of her falling.  We discussed a long-term heart rhythm monitor such as a loop recorder.  She would like to proceed with this.  I did discuss the loop recorder implant procedure in detail including the risks and associated with the monitoring cost and she wishes to proceed.  We will bring her back to clinic on a separate day for loop recorder implant.   Signed, Steffanie Dunn, MD, [facility], [facility] 09/27/2022 9:20 PM    Electrophysiology [facility]

## 2022-10-10 ENCOUNTER — Emergency Department: Payer: Medicare Other

## 2022-10-10 ENCOUNTER — Encounter: Payer: Self-pay | Admitting: Emergency Medicine

## 2022-10-10 DIAGNOSIS — R42 Dizziness and giddiness: Secondary | ICD-10-CM | POA: Insufficient documentation

## 2022-10-10 LAB — BASIC METABOLIC PANEL
Anion gap: 8 (ref 5–15)
BUN: 12 mg/dL (ref 8–23)
CO2: 26 mmol/L (ref 22–32)
Calcium: 8.8 mg/dL — ABNORMAL LOW (ref 8.9–10.3)
Chloride: 103 mmol/L (ref 98–111)
Creatinine, Ser: 0.64 mg/dL (ref 0.44–1.00)
GFR, Estimated: >60 mL/min (ref >60)
Glucose, Bld: 96 mg/dL (ref 70–99)
Potassium: 3.8 mmol/L (ref 3.5–5.1)
Sodium: 137 mmol/L (ref 135–145)

## 2022-10-10 LAB — CBC
HCT: 37.1 % (ref 36.0–46.0)
Hemoglobin: 11.7 g/dL — ABNORMAL LOW (ref 12.0–15.0)
MCH: 30 pg (ref 26.0–34.0)
MCHC: 31.5 g/dL (ref 30.0–36.0)
MCV: 95.1 fL (ref 80.0–100.0)
Platelets: 225 10*3/uL (ref 150–400)
RBC: 3.9 MIL/uL (ref 3.87–5.11)
RDW: 13.2 % (ref 11.5–15.5)
WBC: 5.8 10*3/uL (ref 4.0–10.5)
nRBC: 0 % (ref 0.0–0.2)

## 2022-10-10 LAB — CBG MONITORING, ED: Glucose-Capillary: 86 mg/dL (ref 70–99)

## 2022-10-10 LAB — TROPONIN I (HIGH SENSITIVITY): Troponin I (High Sensitivity): 7 ng/L (ref <18)

## 2022-10-10 NOTE — ED Triage Notes (Signed)
Pt presents ambulatory to triage via POV with complaints of dizziness that started Sunday after a few days of taking Macrobid. Of note, the patient is currently being treated for a UTI and started an antibiotic on Thursday. A&Ox4 at this time. Denies fevers, N/V/D, CP or SOB.    CBG- 87

## 2022-10-11 ENCOUNTER — Emergency Department
Admission: EM | Admit: 2022-10-11 | Discharge: 2022-10-11 | Disposition: A | Discharge status: Home / Self Care | Payer: Medicare Other | Attending: Emergency Medicine | Admitting: Emergency Medicine

## 2022-10-11 ENCOUNTER — Emergency Department: Payer: Medicare Other

## 2022-10-11 DIAGNOSIS — R42 Dizziness and giddiness: Secondary | ICD-10-CM

## 2022-10-11 LAB — URINALYSIS, ROUTINE W REFLEX MICROSCOPIC
Bacteria, UA: NONE SEEN
Bilirubin Urine: NEGATIVE
Glucose, UA: NEGATIVE mg/dL
Ketones, ur: NEGATIVE mg/dL
Leukocytes,Ua: NEGATIVE
Nitrite: NEGATIVE
Protein, ur: NEGATIVE mg/dL
Specific Gravity, Urine: 1.005 (ref 1.005–1.030)
Squamous Epithelial / HPF: NONE SEEN /HPF (ref 0–5)
pH: 7 (ref 5.0–8.0)

## 2022-10-11 LAB — TROPONIN I (HIGH SENSITIVITY): Troponin I (High Sensitivity): 7 ng/L (ref <18)

## 2022-10-11 MED ORDER — BUTALBITAL-APAP-CAFFEINE 50-325-40 MG PO TABS
2.0000 | ORAL_TABLET | Freq: Once | ORAL | Status: AC
  Administered 2022-10-11: 2 via ORAL
  Filled 2022-10-11: qty 2

## 2022-10-11 MED ORDER — ACETAMINOPHEN 500 MG PO TABS: 1000.0000 mg | ORAL_TABLET | Freq: Once | ORAL | Status: DC

## 2022-10-11 MED ORDER — MECLIZINE HCL 25 MG PO TABS: 25.0000 mg | ORAL_TABLET | Freq: Three times a day (TID) | ORAL | 0 refills | Status: DC | PRN

## 2022-10-11 NOTE — ED Provider Notes (Signed)
[facility] Provider Note    Event Date/Time   First MD Initiated Contact with Patient 10/11/22 0032     (approximate)   History   Dizziness   HPI  Sandra [name] is a 77 y.o. female who presents to the ED for evaluation of Dizziness   I review a cardiology clinic visit from 5/1.  History of paroxysmal A-fib, tachybradycardia syndrome, frequent falls  Patient presents to the ED for evaluation of a few days of dizziness, feeling off balance and headache.  She reports similar symptoms when she had a stroke in the past.  She is not currently on anticoagulation because it was a hemorrhagic stroke that she had in the past after starting Eliquis for her paroxysmal A-fib.  She reports currently having Watchman device in place.   Physical Exam   Triage Vital Signs: ED Triage Vitals  Enc Vitals Group     BP 10/10/22 2252 (!) 145/92     Pulse Rate 10/10/22 2252 (!) 58     Resp 10/10/22 2252 18     Temp 10/10/22 2252 98 F (36.7 C)     Temp Source 10/10/22 2252 Oral     SpO2 10/10/22 2252 98 %     Weight 10/10/22 2249 130 lb 8.2 oz (59.2 kg)     Height 10/10/22 2249 5\' 3"  (1.6 m)     Head Circumference --      Peak Flow --      Pain Score --      Pain Loc --      Pain Edu? --      Excl. in GC? --     Most recent vital signs: Vitals:   10/11/22 0500 10/11/22 0548  BP: (!) 151/74   Pulse: (!) 56   Resp: 15   Temp:  97.9 F (36.6 C)  SpO2: 93%     General: Awake, no distress.  Ambulatory with normal gait. CV:  Good peripheral perfusion.  Resp:  Normal effort.  Abd:  No distention.  MSK:  No deformity noted.  Neuro:  No focal deficits appreciated. Cranial nerves II through XII intact 5/5 strength and sensation in all 4 extremities Other:     ED Results / Procedures / Treatments   Labs (all labs ordered are listed, but only abnormal results are displayed) Labs Reviewed  BASIC METABOLIC PANEL - Abnormal; Notable for the following  components:      Result Value   Calcium 8.8 (*)    All other components within normal limits  CBC - Abnormal; Notable for the following components:   Hemoglobin 11.7 (*)    All other components within normal limits  URINALYSIS, ROUTINE W REFLEX MICROSCOPIC - Abnormal; Notable for the following components:   Color, Urine STRAW (*)    APPearance CLEAR (*)    Hgb urine dipstick SMALL (*)    All other components within normal limits  CBG MONITORING, ED  TROPONIN I (HIGH SENSITIVITY)  TROPONIN I (HIGH SENSITIVITY)    EKG Sinus rhythm at a rate of 62 bpm.  Normal axis and intervals.  Nonspecific ST changes inferiorly and laterally similar to previous EKGs.  No clear STEMI.  RADIOLOGY CT head interpreted by me without evidence of acute intracranial pathology CXR interpreted by me without evidence of acute cardiopulmonary pathology.  Official radiology report(s): MR BRAIN WO CONTRAST  Result Date: 10/11/2022 CLINICAL DATA:  77 year old female with new dizziness, vertigo. Atrial fibrillation EXAM: MRI HEAD WITHOUT CONTRAST  TECHNIQUE: Multiplanar, multiecho pulse sequences of the brain and surrounding structures were obtained without intravenous contrast. COMPARISON:  Brain MRI 07/16/2020.  Head CT 0050 hours today. FINDINGS: Brain: Stable cerebral volume since 2022, overall normal for age. No convincing diffusion restriction. 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 Resolved cerebellar edema and mass effect there. Residual hemosiderin on SWI. Elsewhere stable gray and white matter signal since 2022. Small chronic area of encephalomalacia at the anterior body of the corpus callosum (series 9, image 11), with mild for age additional nodular and patchy bilateral periventricular white matter T2 and FLAIR hyperintensity. Small area of chronic cortical encephalomalacia also in the posterior left middle temporal gyrus is stable on series 15,  image 21. Mild T2 heterogeneity in the bilateral deep gray nuclei and central pons is stable. No midline shift, mass effect, evidence of mass lesion, ventriculomegaly, extra-axial collection or acute intracranial hemorrhage. Cervicomedullary junction and pituitary are within normal limits. Vascular: Major intracranial vascular flow voids are stable since 2022, some generalized intracranial artery tortuosity. Skull and upper cervical spine: Negative for age visible cervical spine. Visualized bone marrow signal is within normal limits. Sinuses/Orbits: Orbits appear stable and negative. Occasional paranasal sinus mucous retention cysts are stable. Other: Mastoids remain well aerated. Visible internal auditory structures appear stable and grossly normal. Negative visible scalp and face. IMPRESSION: 1. No acute intracranial abnormality. 2. Expected evolution of 2022 left cerebellar hemorrhage with mild residual hemosiderin. Otherwise stable signal changes in the brain compatible with chronic small vessel disease, although somewhat nonspecific involvement of the anterior corpus callosum and periventricular white matter. Electronically Signed   By: Odessa Fleming M.D.   On: 10/11/2022 04:39   CT Head Wo Contrast  Result Date: 10/11/2022 CLINICAL DATA:  Vertigo EXAM: CT HEAD WITHOUT CONTRAST TECHNIQUE: Contiguous axial images were obtained from the base of the skull through the vertex without intravenous contrast. RADIATION DOSE REDUCTION: This exam was performed according to the departmental dose-optimization program which includes automated exposure control, adjustment of the mA and/or kV according to patient size and/or use of iterative reconstruction technique. COMPARISON:  07/28/2022 FINDINGS: Brain: No evidence of acute infarction, hemorrhage, hydrocephalus, extra-axial collection or mass lesion/mass effect. Mild atrophic changes are noted. Mild chronic white matter ischemic changes are noted. Vascular: No hyperdense  vessel or unexpected calcification. Skull: Normal. Negative for fracture or focal lesion. Sinuses/Orbits: No acute finding. Other: None. IMPRESSION: Chronic atrophic and ischemic changes without acute abnormality. Electronically Signed   By: Alcide Clever M.D.   On: 10/11/2022 00:58   DG Chest 2 View  Result Date: 10/10/2022 CLINICAL DATA:  Dizziness EXAM: CHEST - 2 VIEW COMPARISON:  04/21/2022 FINDINGS: Cardiac shadow is stable. Aortic calcifications are noted. The lungs are well aerated bilaterally. No focal infiltrate or effusion is seen. Watchman device is noted in place. No bony abnormality is noted. IMPRESSION: No acute abnormality seen. Electronically Signed   By: Alcide Clever M.D.   On: 10/10/2022 23:24    PROCEDURES and INTERVENTIONS:  .1-3 Lead EKG Interpretation  Performed by: Delton Prairie, MD Authorized by: Delton Prairie, MD     Interpretation: normal     ECG rate:  60   ECG rate assessment: normal     Rhythm: sinus rhythm     Ectopy: none     Conduction: normal     Medications  butalbital-acetaminophen-caffeine (FIORICET) 50-325-40 MG per tablet 2 tablet (2 tablets Oral Given by Other 10/11/22 0325)  IMPRESSION / MDM / ASSESSMENT AND PLAN / ED COURSE  I reviewed the triage vital signs and the nursing notes.  Differential diagnosis includes, but is not limited to, stroke, peripheral vertigo, ACS, dehydration, untreated acute cystitis, sepsis or pyelonephritis.  {Patient presents with symptoms of an acute illness or injury that is potentially life-threatening.  77 year old woman with a history of recurrent falls presents with vertiginous dizziness that is likely peripheral etiology and suitable for outpatient management with ENT follow-up.  No clear signs of acute trauma or neurologic deficits on exam.  CT head is reassuring without bleeds.  Follow-up MRI obtained due to her history of A-fib without anticoagulation and this is without evidence of acute derangements or CNS  derangements.  No signs of ACS, clear CXR, EKG with chronic nonspecific changes without clear STEMI and she has 2 negative troponins.  Urine without infectious features and she reports her symptoms have resolved.  Normal CBC and metabolic panel.  We discussed close PCP and ENT follow-up.  Patient suitable for outpatient management.      FINAL CLINICAL IMPRESSION(S) / ED DIAGNOSES   Final diagnoses:  Dizziness     Rx / DC Orders   ED Discharge Orders          Ordered    meclizine (ANTIVERT) 25 MG tablet  3 times daily PRN        10/11/22 0527             Note:  This document was prepared using Dragon voice recognition software and may include unintentional dictation errors.   Delton Prairie, MD 10/11/22 [phone]

## 2022-10-11 NOTE — Discharge Instructions (Addendum)
No signs of stroke on the MRI.  Go ahead and stop the Macrobid/nitrofurantoin antibiotic as you have already finished 5 days of it and your urine today does not show signs of infection.  If your dizziness does not improve then please reach out to the ENT clinic You are being discharged with a prescription for meclizine/Antivert medicine that you can use as needed up to 3 times per day to help with any vertigo and dizziness.

## 2022-10-18 ENCOUNTER — Ambulatory Visit: Payer: Medicare Other | Attending: Cardiology | Admitting: Cardiology

## 2022-10-18 ENCOUNTER — Encounter: Payer: Self-pay | Admitting: Cardiology

## 2022-10-18 VITALS — BP 150/78 | HR 69 | Ht 63.0 in | Wt 128.5 lb

## 2022-10-18 DIAGNOSIS — R296 Repeated falls: Secondary | ICD-10-CM

## 2022-10-18 DIAGNOSIS — Z95818 Presence of other cardiac implants and grafts: Secondary | ICD-10-CM

## 2022-10-18 DIAGNOSIS — I495 Sick sinus syndrome: Secondary | ICD-10-CM | POA: Diagnosis not present

## 2022-10-18 DIAGNOSIS — I480 Paroxysmal atrial fibrillation: Secondary | ICD-10-CM | POA: Diagnosis not present

## 2022-10-18 NOTE — Progress Notes (Signed)
Electrophysiology Office Follow up Visit Note:    Date:  10/18/2022   ID:  Sandra [name], DOB [birth year], MRN [id number]  PCP:  Louis Matte, MD  [facility] Cardiologist:  Yvonne Kendall, MD  [facility] Electrophysiologist:  Lanier Prude, MD    Interval History:    Sandra [name] is a 77 y.o. female who presents for a follow up visit.   Last seen Sep 27, 2022 for her history of paroxysmal atrial fibrillation and gait instability/near syncope.  She is fallen multiple times with fractures in bilateral wrists.  There is concerned that episodic bradycardia or pauses could be contributing to gait instability and her falls.  At her last appointment we discussed using a loop recorder for ongoing rhythm surveillance especially given her history of atrial fibrillation.  She presents today to have the loop recorder implanted.       Past medical, surgical, social and family history were reviewed.  ROS:   Please see the history of present illness.    All other systems reviewed and are negative.    Physical Exam:    VS:  BP (!) 150/78 (BP Location: Left Arm, Patient Position: Sitting, Cuff Size: Normal)   Pulse 69   Ht 5\' 3"  (1.6 m)   Wt 128 lb 8 oz (58.3 kg)   SpO2 97%   BMI 22.76 kg/m     Wt Readings from Last 3 Encounters:  10/18/22 128 lb 8 oz (58.3 kg)  10/10/22 130 lb 8.2 oz (59.2 kg)  09/27/22 129 lb (58.5 kg)     GEN:  Well nourished, well developed in no acute distress CARDIAC: RRR, no murmurs, rubs, gallops RESPIRATORY:  Clear to auscultation without rales, wheezing or rhonchi       ASSESSMENT:    1. Tachycardia-bradycardia syndrome (HCC)   2. PAF (paroxysmal atrial fibrillation) (HCC)   3. Falls frequently   4. Presence of Watchman left atrial appendage closure device    PLAN:    In order of problems listed above:  #Atrial fibrillation #Tachybradycardia syndrome Doing well maintaining sinus rhythm. Loop recorder for ongoing  rhythm surveillance.  #Watchman in situ Device well-positioned on repeat imaging. Continue aspirin 81 mg by mouth once daily  #Frequent falls #Gait instability There is concerned that her gait instability that is very episodic is due to an arrhythmia given her diagnosis of tachybradycardia syndrome and atrial fibrillation.  I discussed using a loop recorder for ongoing rhythm surveillance and she is interested in proceeding.  I discussed the procedure in detail including the risks and monthly monitoring cost and she wishes to proceed.     Signed, Steffanie Dunn, MD, [facility], [facility] 10/18/2022 9:27 PM    Electrophysiology [facility]  -----------------------  SURGEON:  Lanier Prude, MD     PREPROCEDURE DIAGNOSIS:  Syncope    POSTPROCEDURE DIAGNOSIS: Syncope     PROCEDURES:   1. Implantable loop recorder implantation    INTRODUCTION:  Sandra [name] presents with a history of syncope The costs of loop recorder monitoring have been discussed with the patient.    DESCRIPTION OF PROCEDURE:  Informed written consent was obtained.  The patient required no sedation for the procedure today.  Mapping over the patient's chest was performed to identify the area where electrograms were most prominent for ILR recording.  This area was found to be the left parasternal region over the 4th intercostal space. The patients left chest was therefore prepped and draped  in the usual sterile fashion. The skin overlying the left parasternal region was infiltrated with lidocaine for local analgesia.  A 0.5-cm incision was made over the left parasternal region over the 3rd intercostal space.  A subcutaneous ILR pocket was fashioned using a combination of sharp and blunt dissection.  A Med[employer]onic Reveal [id number]) implantable loop recorder was then placed into the pocket  R waves were very prominent and measured >0.45mV.  Steri- Strips and a sterile dressing were then applied.   There were no early apparent complications.     CONCLUSIONS:   1. Successful implantation of a implantable loop recorder for Syncope  2. No early apparent complications.   Sheria Lang T. Lalla Brothers, MD, [place], [facility] Cardiac Electrophysiology

## 2022-10-18 NOTE — Patient Instructions (Addendum)
Medication Instructions:  Your physician recommends that you continue on your current medications as directed. Please refer to the Current Medication list given to you today.  Labwork: None ordered.  Testing/Procedures: None ordered.  Follow-Up:  Your physician wants you to follow-up in: 3 months with Sherie Don, NP.  You will receive a reminder letter in the mail two months in advance. If you don't receive a letter, please call our office to schedule the follow-up appointment.   Implantable Loop Recorder Placement, Care After This sheet gives you information about how to care for yourself after your procedure. Your health care provider may also give you more specific instructions. If you have problems or questions, contact your health care provider. What can I expect after the procedure? After the procedure, it is common to have: Soreness or discomfort near the incision. Some swelling or bruising near the incision.  Follow these instructions at home: Incision care  Monitor your cardiac device site for redness, swelling, and drainage. Call the device clinic at [phone] if you experience these symptoms or fever/chills.  Keep the large square bandage on your site for 24 hours and then you may remove it yourself. Keep the steri-strips underneath in place.   You may shower after 72 hours / 3 days from your procedure with the steri-strips in place. They will usually fall off on their own, or may be removed after 10 days. Pat dry.   Avoid lotions, ointments, or perfumes over your incision until it is well-healed.  Please do not submerge in water until your site is completely healed.   Your device is MRI compatible.   Remote monitoring is used to monitor your cardiac device from home. This monitoring is scheduled every month by our office. It allows [place] to keep an eye on the function of your device to ensure it is working properly.  If your wound site starts to bleed apply  pressure.    For help with the monitor please call [employer] Monitor Support Specialist directly at [phone].    If you have any questions/concerns please call the device clinic at [phone].  Activity  Return to your normal activities.  General instructions Follow instructions from your health care provider about how to manage your implantable loop recorder and transmit the information. Learn how to activate a recording if this is necessary for your type of device. You may go through a metal detection gate, and you may let someone hold a metal detector over your chest. Show your ID card if needed. Do not have an MRI unless you check with your health care provider first. Take over-the-counter and prescription medicines only as told by your health care provider. Keep all follow-up visits as told by your health care provider. This is important. Contact a health care provider if: You have redness, swelling, or pain around your incision. You have a fever. You have pain that is not relieved by your pain medicine. You have triggered your device because of fainting (syncope) or because of a heartbeat that feels like it is racing, slow, fluttering, or skipping (palpitations). Get help right away if you have: Chest pain. Difficulty breathing. Summary After the procedure, it is common to have soreness or discomfort near the incision. Change your dressing as told by your health care provider. Follow instructions from your health care provider about how to manage your implantable loop recorder and transmit the information. Keep all follow-up visits as told by your health care provider. This is important. This information  is not intended to replace advice given to you by your health care provider. Make sure you discuss any questions you have with your health care provider. Document Released: 04/26/2015 Document Revised: 06/30/2017 Document Reviewed: 06/30/2017 Elsevier Patient Education   2020 [employer].

## 2022-10-19 ENCOUNTER — Other Ambulatory Visit: Payer: Self-pay | Admitting: Physician Assistant

## 2022-11-17 ENCOUNTER — Other Ambulatory Visit: Payer: Self-pay | Admitting: Cardiology

## 2022-11-19 ENCOUNTER — Other Ambulatory Visit: Payer: Self-pay | Admitting: Physician Assistant

## 2022-11-21 ENCOUNTER — Ambulatory Visit (INDEPENDENT_AMBULATORY_CARE_PROVIDER_SITE_OTHER): Payer: Medicare Other

## 2022-11-21 DIAGNOSIS — I495 Sick sinus syndrome: Secondary | ICD-10-CM

## 2022-11-22 LAB — CUP PACEART REMOTE DEVICE CHECK
Date Time Interrogation Session: 20240625210014
Implantable Pulse Generator Implant Date: 20240522

## 2022-12-05 ENCOUNTER — Other Ambulatory Visit: Payer: Self-pay | Admitting: Physician Assistant

## 2022-12-15 NOTE — Progress Notes (Signed)
Carelink Summary Report / Loop Recorder 

## 2022-12-25 ENCOUNTER — Ambulatory Visit (INDEPENDENT_AMBULATORY_CARE_PROVIDER_SITE_OTHER): Payer: Medicare Other

## 2022-12-25 DIAGNOSIS — R001 Bradycardia, unspecified: Secondary | ICD-10-CM | POA: Diagnosis not present

## 2023-01-05 ENCOUNTER — Other Ambulatory Visit: Payer: Self-pay | Admitting: Cardiology

## 2023-01-05 ENCOUNTER — Other Ambulatory Visit: Payer: Self-pay | Admitting: Physician Assistant

## 2023-01-10 NOTE — Progress Notes (Signed)
Carelink Summary Report / Loop Recorder 

## 2023-01-18 ENCOUNTER — Ambulatory Visit: Payer: Medicare Other | Attending: Cardiology | Admitting: Cardiology

## 2023-01-18 ENCOUNTER — Encounter: Payer: Self-pay | Admitting: Cardiology

## 2023-01-18 VITALS — BP 136/64 | HR 57 | Ht 63.0 in | Wt 129.2 lb

## 2023-01-18 DIAGNOSIS — Z95818 Presence of other cardiac implants and grafts: Secondary | ICD-10-CM

## 2023-01-18 DIAGNOSIS — Z79899 Other long term (current) drug therapy: Secondary | ICD-10-CM | POA: Diagnosis not present

## 2023-01-18 DIAGNOSIS — D6869 Other thrombophilia: Secondary | ICD-10-CM

## 2023-01-18 DIAGNOSIS — R911 Solitary pulmonary nodule: Secondary | ICD-10-CM

## 2023-01-18 DIAGNOSIS — I480 Paroxysmal atrial fibrillation: Secondary | ICD-10-CM | POA: Diagnosis not present

## 2023-01-18 DIAGNOSIS — R0602 Shortness of breath: Secondary | ICD-10-CM

## 2023-01-18 DIAGNOSIS — I495 Sick sinus syndrome: Secondary | ICD-10-CM

## 2023-01-18 NOTE — Progress Notes (Signed)
Cardiology Office Note Date:  01/18/2023  Patient ID:  Sandra [name], Sandra [name] Sep 22, 1945, MRN [id number] PCP:  Louis Matte, MD  Cardiologist:  Yvonne Kendall, MD Electrophysiologist: Lanier Prude, MD   Chief Complaint: afib, tachy-brady follow-up  History of Present Illness: Sandra [name] is a 77 y.o. female with PMH notable for tachy-brady, parox AFib, gait instability, s/p LAAO w Watchman; seen today for Lanier Prude, MD for routine electrophysiology followup.  She saw Dr. Lalla Brothers 09/2022 for ILR Implant for ongoing afib surveillance and to eval for bradycardia/pauses with frequent falls.  On follow-up today, she does continue to have afib episodes. Has had two that she knows about since last being seen. One episode lasted several hours, and so she took 1/2 of her metoprolol as previously instructed. About 45 minute after taking toprol, she noticed that her HR was back in rhythm and she felt better.  She has continued gait instability, no further falls. She also has noticed that she is having SOB with walking, was SOB with walking from waiting room to the exam room. She used to follow-up with pulmnology in [place] for lung nodules, but has not been seen in many years.   She has been under considerable stress lately caring for her husband who has advanced emphysema, non-operable lung nodules and receiving radiation.     she denies chest pain, PND, nausea, vomiting, dizziness, syncope, edema, weight gain, or early satiety.     Device Information: MDT ILR, imp 09/2022; syncope, afib   AAD history: Amiodarone  Past Medical History:  Diagnosis Date   (HFpEF) heart failure with preserved ejection fraction (HCC)    a. 08/2019 Echo: EF 60-65%, no rwma, mild LVH, Gr2 DD, nl RV fxn. Nl pASP. Mildly dil LA. Mild to mod MR.   Anemia    Anxiety    Arthritis    BRCA gene mutation negative 10/2016   NEGATIVE: [facility]   Breast cancer (HCC) 10/16/2016   T1c,N0;, GRADE  I/III, 1.6cm. ER/PR pos  HER2 not over expressed, Right Upper Outer   Celiac disease    Celiac syndrome    Cerebellar hemorrhage, acute (HCC) 07/17/2020   Depression    Dyspnea    WITH EXERTION   Fatty liver    GERD (gastroesophageal reflux disease)    OCC   Glaucoma    right eye   Headache    H/O MIGRAINES AS TEENAGER.   Heart murmur    a. 08/2019 Echo: mild to mod MR.   Hypertension    Hypothyroidism    Non-obstructive CAD (coronary artery disease)    a. 01/2017 MV: Hypertensive response. No ischemia/infarct. EF >65%; b. 09/2019 Cor CTA: Ca2+ = 9.29 (36th percentile). LAD calcified plaque (0-24%), otw nl. Multipel bilat pulm nodules up to 7mm.   Osteoporosis    PAF (paroxysmal atrial fibrillation) (HCC)    a. 02/2020 Zio: predominantly RSR, 65 (50-105), rare PACs/PVCs, 8 beats NSVT, multiple episodes of PAF lasting up to 1hr . Avg AF rate 130 (93-170). AF burden <1%. Triggered events = RSR, PACs, and PAF; b. CHA2DS2VASc = 6.   Pneumonia    YEARS AGO   Pre-diabetes    Presence of Watchman left atrial appendage closure device 01/2021   Pulmonary nodules    a. 09/2019 Cor CTA: incidental finding of multiple bilat pulm nodules; b. 12/2019 High Res CT: stable, scattered solid pulm nodules.   Raynaud's disease    RLS (restless legs syndrome)    Stroke (  HCC) 06/2020   mild balance issues   Wears hearing aid in both ears     Past Surgical History:  Procedure Laterality Date   ABDOMINAL HYSTERECTOMY     APPENDECTOMY     AXILLARY LYMPH NODE BIOPSY Right 03/05/2017   Procedure: AXILLARY LYMPH NODE BIOPSY;  Surgeon: Earline Mayotte, MD;  Location: [facility] ORS;  Service: General;  Laterality: Right;   CATARACT EXTRACTION Bilateral    CESAREAN SECTION     COLONOSCOPY WITH PROPOFOL N/A 05/19/2015   Procedure: COLONOSCOPY WITH PROPOFOL;  Surgeon: Scot Jun, MD;  Location: [facility] ENDOSCOPY;  Service: Endoscopy;  Laterality: N/A;   COLONOSCOPY WITH PROPOFOL N/A 08/01/2019    Procedure: COLONOSCOPY WITH PROPOFOL;  Surgeon: Midge Minium, MD;  Location: [facility] ENDOSCOPY;  Service: Endoscopy;  Laterality: N/A;   EYE SURGERY     HAND SURGERY Left    LEFT ATRIAL APPENDAGE OCCLUSION N/A 01/27/2021   Procedure: LEFT ATRIAL APPENDAGE OCCLUSION;  Surgeon: Lanier Prude, MD;  Location: [facility] INVASIVE CV LAB;  Service: Cardiovascular;  Laterality: N/A;   MASTECTOMY Right 11/07/2016   RESIDUAL INVASIVE MAMMARY CARCINOMA, SUBAREOLAR ANTERIOR TO PREVIOUS    OPEN REDUCTION INTERNAL FIXATION (ORIF) DISTAL RADIAL FRACTURE Right 04/11/2022   Procedure: OPEN REDUCTION INTERNAL FIXATION (ORIF) DISTAL RADIUS FRACTURE;  Surgeon: Kennedy Bucker, MD;  Location: [facility] SURGERY CNTR;  Service: Orthopedics;  Laterality: Right;   OPEN REDUCTION INTERNAL FIXATION (ORIF) DISTAL RADIAL FRACTURE Left 08/04/2022   Procedure: OPEN REDUCTION INTERNAL FIXATION (ORIF) DISTAL RADIUS FRACTURE;  Surgeon: Kennedy Bucker, MD;  Location: [facility] SURGERY [facility]TR;  Service: Orthopedics;  Laterality: Left;   SENTINEL NODE BIOPSY Right 11/07/2016   Procedure: SENTINEL NODE BIOPSY;  Surgeon: Earline Mayotte, MD;  Location: [facility] ORS;  Service: General;  Laterality: Right;   SIMPLE MASTECTOMY WITH AXILLARY SENTINEL NODE BIOPSY Right 11/07/2016   6 mm ER/PR 100%; Her 2 neu not overexpressed, T1b, N0.  Surgeon: Earline Mayotte, MD;  Location: [facility] ORS;  Service: General;  Laterality: Right;   TEE WITHOUT CARDIOVERSION N/A 01/27/2021   Procedure: TRANSESOPHAGEAL ECHOCARDIOGRAM (TEE);  Surgeon: Lanier Prude, MD;  Location: [facility] INVASIVE CV LAB;  Service: Cardiovascular;  Laterality: N/A;   TEE WITHOUT CARDIOVERSION N/A 03/10/2021   Procedure: TRANSESOPHAGEAL ECHOCARDIOGRAM (TEE);  Surgeon: Parke Poisson, MD;  Location: [facility] ENDOSCOPY;  Service: Cardiology;  Laterality: N/A;   TONSILLECTOMY  AGE 64   UPPER GI ENDOSCOPY  01/12/06   hiatus hernia    Current Outpatient Medications  Medication Instructions   ALPRAZolam  (XANAX) 0.5 mg, Oral, At bedtime PRN, for sleep   amiodarone (PACERONE) 200 mg, Oral, Daily   aspirin EC 81 mg, Oral, Daily, Swallow whole.   atorvastatin (LIPITOR) 40 MG tablet TAKE 1 TABLET BY MOUTH DAILY   bisacodyl 5 mg, Oral, Daily PRN   Calcium Carb-Cholecalciferol (CALCIUM 600 + D PO) 1 tablet, Oral, Daily   docusate sodium (COLACE) 100 mg, Oral, Daily PRN   hydroxypropyl methylcellulose / hypromellose (ISOPTO TEARS / GONIOVISC) 2.5 % ophthalmic solution 1 drop, Both Eyes, 4 times daily PRN   iron polysaccharides (NIFEREX) 150 mg, Oral, Daily, Or can take any over-the-counter iron supplement.   latanoprost (XALATAN) 0.005 % ophthalmic solution 1 drop, Both Eyes, Daily at bedtime   levothyroxine (SYNTHROID) 100 MCG tablet TAKE 1 TABLET BY MOUTH 30  MINUTES BEFORE BREAKFAST   meclizine (ANTIVERT) 25 mg, Oral, 3 times daily PRN   Multiple Vitamin (MULTIVITAMIN WITH MINERALS) TABS tablet 1 tablet, Oral,  Daily   omeprazole (PRILOSEC) 40 MG capsule TAKE 1 CAPSULE BY MOUTH DAILY   potassium chloride (KLOR-CON) 20 MEQ packet DISSOLVE 2 PACKETS IN 4 OZ WATER OR JUICE AND DRINK DAILY   sertraline (ZOLOFT) 100 mg, Oral, Daily   timolol (BETIMOL) 0.5 % ophthalmic solution 1 drop, Both Eyes, 2 times daily   Vitamin D3 5,000 Units, Oral, Daily    Social History:  The patient  reports that she has never smoked. She has never used smokeless tobacco. She reports that she does not currently use alcohol. She reports that she does not use drugs.   Family History:  The patient's family history includes Alcohol abuse in her father; Anemia in her mother; Breast cancer in her sister; Breast cancer (age of onset: 16) in her maternal grandmother; CAD in her paternal grandfather; Cancer in her mother and sister; Heart attack in her maternal grandfather; Other in her mother; Prostate cancer in her father; Stroke in her maternal grandmother.  ROS:  Please see the history of present illness. All other systems are  reviewed and otherwise negative.   PHYSICAL EXAM:  VS:  BP 136/64 (BP Location: Left Arm, Patient Position: Sitting, Cuff Size: Normal)   Pulse (!) 57   Ht 5\' 3"  (1.6 m)   Wt 129 lb 3.2 oz (58.6 kg)   SpO2 96%   BMI 22.89 kg/m  BMI: There is no height or weight on file to calculate BMI.  GEN- The patient is well appearing, alert and oriented x 3 today.   Lungs- Clear to ausculation bilaterally, normal work of breathing.  Heart- Regular rate and rhythm, no murmurs, rubs or gallops Extremities- No peripheral edema, warm, dry Skin-  ILR insertion site well-healed   Device interrogation done today and reviewed by myself:  Battery good Multiple AF episodes; overall burden 0.9%   EKG is not ordered. Personal review of EKG from  10/10/2022  shows:  NSR, rate 62        Recent Labs: 04/21/2022: B Natriuretic Peptide 691.3 04/23/2022: Magnesium 2.0 07/12/2022: ALT 43; TSH 3.532 10/10/2022: BUN 12; Creatinine, Ser 0.64; Hemoglobin 11.7; Platelets 225; Potassium 3.8; Sodium 137  No results found for requested labs within last 365 days.   CrCl cannot be calculated (Patient's most recent lab result is older than the maximum 21 days allowed.).   Wt Readings from Last 3 Encounters:  10/18/22 128 lb 8 oz (58.3 kg)  10/10/22 130 lb 8.2 oz (59.2 kg)  09/27/22 129 lb (58.5 kg)     Additional studies reviewed include: Previous EP, cardiology notes.   TTE, 04/22/2022  1. Left ventricular ejection fraction, by estimation, is 60 to 65%. The left ventricle has normal function. The left ventricle has no regional wall motion abnormalities. There is mild left ventricular hypertrophy. Left ventricular diastolic parameters are indeterminate.   2. Right ventricular systolic function is normal. The right ventricular size is normal.   3. Left atrial size was moderately dilated.   4. The mitral valve is normal in structure. Mild mitral valve regurgitation.   5. The aortic valve is tricuspid. Aortic  valve regurgitation is not visualized.   6. The inferior vena cava is normal in size with greater than 50% respiratory variability, suggesting right atrial pressure of 3 mmHg.   TEE, 03/10/2021 1. 24 mm Watchman FLX left atrial appendage occluder well seated, no residual flow. . Left atrial size was moderately dilated. No left atrial/left atrial appendage thrombus was detected.   2. Left ventricular  ejection fraction, by estimation, is 60 to 65%. The left ventricle has normal function.   3. Right ventricular systolic function is normal. The right ventricular size is normal.   4. Right atrial size was moderately dilated.   5. The mitral valve is grossly normal.   6. The aortic valve is normal in structure. Aortic valve regurgitation is trivial.   7. There is mild (Grade II) atheroma plaque involving the transverse aorta.   8. Evidence of atrial level shunting detected by color flow Doppler. There is a small iatrogenic mid septal atrial septal defect with predominantly left to right shunting across the atrial septum.   ASSESSMENT AND PLAN:  #) Tachy-brady syndrome #) parox AFib #) Bradycardia Continues to have brief paroxysms of afib on 200mg  amiodarone daily Continue to take 12.5mg  toprol PRN for palpitation, tachycardia episodes Closely monitor BP and pulse when taking PRN toprol  Update CMP, TSH, T4 today   #) s/p LAAO with Watchamn Follow-up imaging shows watchman well-seated with no residual flow Continue 81mg  ASA daily  #) DOE #) lung nodules Unclear cause of worsening DOE Worsening DOE is certainly concerning with amiodarone use Lung nodules previously followed with pulm in [place], prefers to establish care with pulm locally Husband sees Dr. Jayme Cloud, so will make referral to her       Current medicines are reviewed at length with the patient today.   The patient does not have concerns regarding her medicines.  The following changes were made today:  none  Labs/ tests  ordered today include:  Orders Placed This Encounter  Procedures   CBC   Comprehensive metabolic panel   Magnesium   T4, free   TSH   Ambulatory referral to Pulmonology     Disposition: Follow up with Dr. Lalla Brothers or EP APP in 6 months   Signed, Sherie Don, NP  01/18/23  1:17 PM  Electrophysiology [facility]

## 2023-01-18 NOTE — Patient Instructions (Addendum)
Medication Instructions:  The current medical regimen is effective;  continue present plan and medications.  Call us with the dose of your Metoprolol- please let us know if this is Metoprolol Succinate or Metoprolol Tartrate as well.  *If you need a refill on your cardiac medications before your next appointment, please call your pharmacy*   Lab Work: Your provider would like for you to have following labs drawn today TSH, FREE T4, CBC, CMET, MAG.   If you have labs (blood work) drawn today and your tests are completely normal, you will receive your results only by: MyChart Message (if you have MyChart) OR A paper copy in the mail If you have any lab test that is abnormal or we need to change your treatment, we will call you to review the results.   Follow-Up: At [facility], you and your health needs are our priority.  As part of our continuing mission to provide you with exceptional heart care, we have created designated Provider Care Teams.  These Care Teams include your primary Cardiologist (physician) and Advanced Practice Providers (APPs -  Physician Assistants and Nurse Practitioners) who all work together to provide you with the care you need, when you need it.  We recommend signing up for the patient portal called "MyChart".  Sign up information is provided on this After Visit Summary.  MyChart is used to connect with patients for Virtual Visits (Telemedicine).  Patients are able to view lab/test results, encounter notes, upcoming appointments, etc.  Non-urgent messages can be sent to your provider as well.   To learn more about what you can do with MyChart, go to [email or web address].    Your next appointment:   6 month(s)  Provider:   Sherie Don, NP    Other Instructions Referral to Pulmonology- they will contact you for an appointment.

## 2023-01-19 LAB — COMPREHENSIVE METABOLIC PANEL
ALT: 31 IU/L (ref 0–32)
AST: 29 IU/L (ref 0–40)
Albumin: 3.7 g/dL — ABNORMAL LOW (ref 3.8–4.8)
Alkaline Phosphatase: 96 IU/L (ref 44–121)
BUN/Creatinine Ratio: 14 (ref 12–28)
BUN: 11 mg/dL (ref 8–27)
Bilirubin Total: 0.5 mg/dL (ref 0.0–1.2)
CO2: 26 mmol/L (ref 20–29)
Calcium: 9 mg/dL (ref 8.7–10.3)
Chloride: 103 mmol/L (ref 96–106)
Creatinine, Ser: 0.76 mg/dL (ref 0.57–1.00)
Globulin, Total: 2.4 g/dL (ref 1.5–4.5)
Glucose: 95 mg/dL (ref 70–99)
Potassium: 4 mmol/L (ref 3.5–5.2)
Sodium: 141 mmol/L (ref 134–144)
Total Protein: 6.1 g/dL (ref 6.0–8.5)
eGFR: 81 mL/min/{1.73_m2} (ref >59)

## 2023-01-19 LAB — CBC
Hematocrit: 32.9 % — ABNORMAL LOW (ref 34.0–46.6)
Hemoglobin: 10.6 g/dL — ABNORMAL LOW (ref 11.1–15.9)
MCH: 28.6 pg (ref 26.6–33.0)
MCHC: 32.2 g/dL (ref 31.5–35.7)
MCV: 89 fL (ref 79–97)
Platelets: 290 10*3/uL (ref 150–450)
RBC: 3.71 x10E6/uL — ABNORMAL LOW (ref 3.77–5.28)
RDW: 13.1 % (ref 11.7–15.4)
WBC: 5.9 10*3/uL (ref 3.4–10.8)

## 2023-01-19 LAB — T4, FREE: Free T4: 1.69 ng/dL (ref 0.82–1.77)

## 2023-01-19 LAB — TSH: TSH: 4.06 u[IU]/mL (ref 0.450–4.500)

## 2023-01-19 LAB — MAGNESIUM: Magnesium: 2 mg/dL (ref 1.6–2.3)

## 2023-01-19 MED ORDER — METOPROLOL SUCCINATE ER 25 MG PO TB24: ORAL_TABLET | ORAL | 1 refills | Status: DC

## 2023-01-19 NOTE — Addendum Note (Signed)
Addended by: Darene Lamer T on: 01/19/2023 01:05 PM   Modules accepted: Orders

## 2023-01-30 ENCOUNTER — Ambulatory Visit (INDEPENDENT_AMBULATORY_CARE_PROVIDER_SITE_OTHER): Payer: Medicare Other

## 2023-01-30 DIAGNOSIS — R55 Syncope and collapse: Secondary | ICD-10-CM

## 2023-01-30 LAB — CUP PACEART REMOTE DEVICE CHECK
Date Time Interrogation Session: 20240830210016
Implantable Pulse Generator Implant Date: 20240522

## 2023-02-05 ENCOUNTER — Ambulatory Visit: Payer: Medicare Other | Admitting: Student in an Organized Health Care Education/Training Program

## 2023-02-05 ENCOUNTER — Encounter: Payer: Self-pay | Admitting: Student in an Organized Health Care Education/Training Program

## 2023-02-05 VITALS — BP 120/70 | HR 48 | Temp 97.6°F | Ht 63.0 in | Wt 129.8 lb

## 2023-02-05 DIAGNOSIS — R0602 Shortness of breath: Secondary | ICD-10-CM | POA: Diagnosis not present

## 2023-02-05 DIAGNOSIS — R918 Other nonspecific abnormal finding of lung field: Secondary | ICD-10-CM | POA: Diagnosis not present
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
## 2023-02-08 NOTE — Progress Notes (Signed)
Carelink Summary Report / Loop Recorder 

## 2023-02-12 ENCOUNTER — Telehealth: Payer: Self-pay | Admitting: Internal Medicine

## 2023-02-12 ENCOUNTER — Other Ambulatory Visit: Payer: Medicare Other

## 2023-02-12 NOTE — Telephone Encounter (Signed)
STAT if HR is under 50 or over 120 (normal HR is 60-100 beats per minute)  What is your heart rate?   Do you have a log of your heart rate readings (document readings)?  9/10: 166/96 109          145/88 103          153/101 103          158/90 112          159/97 13  9/14: 149/62 46          127/104 42          149/56 47  9/15: 169/69 50  9/16: 137/66 55  Do you have any other symptoms?   faint/lightheaded, weakness, occasional SOB, hot flashes

## 2023-02-13 NOTE — Telephone Encounter (Signed)
Pt's daughter calling to speak with nurse to discuss what is fully going on with the pt and needing a sooner appt. Please advise

## 2023-02-13 NOTE — Telephone Encounter (Signed)
Spoke to patient's daughter Marcelino Duster and she stated that her mother has been having more frequent Afib episodes than normal and stated that her mother told her that she is not feeling good or herself lately. Patient's daughter mentioned that she feels it might be time to discuss a pacemaker. Patient is scheduled for Wednesday 02/14/23 at 1:00 PM. Patient's daughter asked if she could join the appointment via phone as to make sure her mother doesn't forget things than are discussed during the appointment.

## 2023-02-14 ENCOUNTER — Encounter: Payer: Self-pay | Admitting: Cardiology

## 2023-02-14 ENCOUNTER — Ambulatory Visit: Payer: Medicare Other | Attending: Cardiology | Admitting: Cardiology

## 2023-02-14 ENCOUNTER — Telehealth: Payer: Self-pay

## 2023-02-14 VITALS — BP 140/70 | HR 57 | Ht 63.0 in | Wt 125.4 lb

## 2023-02-14 DIAGNOSIS — I480 Paroxysmal atrial fibrillation: Secondary | ICD-10-CM

## 2023-02-14 DIAGNOSIS — R55 Syncope and collapse: Secondary | ICD-10-CM

## 2023-02-14 DIAGNOSIS — I495 Sick sinus syndrome: Secondary | ICD-10-CM

## 2023-02-14 MED ORDER — AMIODARONE HCL 100 MG PO TABS: 100.0000 mg | ORAL_TABLET | Freq: Every day | ORAL | 1 refills | Status: DC

## 2023-02-14 NOTE — Telephone Encounter (Signed)
Attempted to contact patient daughter (okay per DPR), advising that patient recently had an appointment- I would be happy to answer any questions/concerns. If appointment was not needed, since patient has issues with remembering the visit discussions. Left call back number.

## 2023-02-14 NOTE — Progress Notes (Signed)
Cardiology Office Note Date:  02/14/2023  Patient ID:  Sandra, [name] [birth year], MRN [id number] PCP:  Louis Matte, MD  Cardiologist:  Yvonne Kendall, MD Electrophysiologist: Lanier Prude, MD   Chief Complaint: afib, tachy-brady follow-up  History of Present Illness: Sandra [name] is a 77 y.o. female with PMH notable for tachy-brady, parox AFib, gait instability, s/p LAAO w Watchman; seen today for Lanier Prude, MD for routine electrophysiology followup.  She saw Dr. Lalla Brothers 09/2022 for ILR Implant for ongoing afib surveillance and to eval for bradycardia/pauses with frequent falls.  I saw her for routine follow-up 12/2022 where she continued to have rare paroxysms of afib, took 1/2 metop as prescribed and felt better. Continued to have gait instability, no further falls. Having SOB w lung nodules previously followed with pulm in [place], referred to pulm locally. Under considerable stress lately caring for her husband who has advanced emphysema, non-operable lung nodules and receiving radiation.   Her daughter called clinic earlier this week concerned for patient's bradycardia with increased AFib burden.   On follow-up today, she brings a log of home BP and pulse readings. She had Afib episodes on 9/10 and 9/11 with HRs to 110s. Took 1/2 metop these days. Other than these dates, her pulse remains in high 40-60s with BP ranging from 120-160/50-80. She has indicated on her log that she is lightheaded with these pulses. No recent falls.   Continues to take 200mg  amiodarone daily.  Her daughter, Marcelino Duster, joins visit by phone.    Device Information: MDT ILR, imp 09/2022; syncope, afib   AAD history: Amiodarone  Past Medical History:  Diagnosis Date   (HFpEF) heart failure with preserved ejection fraction (HCC)    a. 08/2019 Echo: EF 60-65%, no rwma, mild LVH, Gr2 DD, nl RV fxn. Nl pASP. Mildly dil LA. Mild to mod MR.   Anemia    Anxiety    Arthritis    BRCA  gene mutation negative 10/2016   NEGATIVE: [employer]   Breast cancer (HCC) 10/16/2016   T1c,N0;, GRADE I/III, 1.6cm. ER/PR pos  HER2 not over expressed, Right Upper Outer   Celiac disease    Celiac syndrome    Cerebellar hemorrhage, acute (HCC) 07/17/2020   Depression    Dyspnea    WITH EXERTION   Fatty liver    GERD (gastroesophageal reflux disease)    OCC   Glaucoma    right eye   Headache    H/O MIGRAINES AS TEENAGER.   Heart murmur    a. 08/2019 Echo: mild to mod MR.   Hypertension    Hypothyroidism    Non-obstructive CAD (coronary artery disease)    a. 01/2017 MV: Hypertensive response. No ischemia/infarct. EF >65%; b. 09/2019 Cor CTA: Ca2+ = 9.29 (36th percentile). LAD calcified plaque (0-24%), otw nl. Multipel bilat pulm nodules up to 7mm.   Osteoporosis    PAF (paroxysmal atrial fibrillation) (HCC)    a. 02/2020 Zio: predominantly RSR, 65 (50-105), rare PACs/PVCs, 8 beats NSVT, multiple episodes of PAF lasting up to 1hr . Avg AF rate 130 (93-170). AF burden <1%. Triggered events = RSR, PACs, and PAF; b. CHA2DS2VASc = 6.   Pneumonia    YEARS AGO   Pre-diabetes    Presence of Watchman left atrial appendage closure device 01/2021   Pulmonary nodules    a. 09/2019 Cor CTA: incidental finding of multiple bilat pulm nodules; b. 12/2019 High Res CT: stable, scattered solid pulm nodules.   Raynaud's  disease    RLS (restless legs syndrome)    Stroke (HCC) 06/2020   mild balance issues   Wears hearing aid in both ears     Past Surgical History:  Procedure Laterality Date   ABDOMINAL HYSTERECTOMY     APPENDECTOMY     AXILLARY LYMPH NODE BIOPSY Right 03/05/2017   Procedure: AXILLARY LYMPH NODE BIOPSY;  Surgeon: Earline Mayotte, MD;  Location: [facility] ORS;  Service: General;  Laterality: Right;   CATARACT EXTRACTION Bilateral    CESAREAN SECTION     COLONOSCOPY WITH PROPOFOL N/A 05/19/2015   Procedure: COLONOSCOPY WITH PROPOFOL;  Surgeon: Scot Jun, MD;  Location: [facility]  ENDOSCOPY;  Service: Endoscopy;  Laterality: N/A;   COLONOSCOPY WITH PROPOFOL N/A 08/01/2019   Procedure: COLONOSCOPY WITH PROPOFOL;  Surgeon: Midge Minium, MD;  Location: [facility] ENDOSCOPY;  Service: Endoscopy;  Laterality: N/A;   EYE SURGERY     HAND SURGERY Left    LEFT ATRIAL APPENDAGE OCCLUSION N/A 01/27/2021   Procedure: LEFT ATRIAL APPENDAGE OCCLUSION;  Surgeon: Lanier Prude, MD;  Location: [facility] INVASIVE CV LAB;  Service: Cardiovascular;  Laterality: N/A;   MASTECTOMY Right 11/07/2016   RESIDUAL INVASIVE MAMMARY CARCINOMA, SUBAREOLAR ANTERIOR TO PREVIOUS    OPEN REDUCTION INTERNAL FIXATION (ORIF) DISTAL RADIAL FRACTURE Right 04/11/2022   Procedure: OPEN REDUCTION INTERNAL FIXATION (ORIF) DISTAL RADIUS FRACTURE;  Surgeon: Kennedy Bucker, MD;  Location: [facility] SURGERY [facility];  Service: Orthopedics;  Laterality: Right;   OPEN REDUCTION INTERNAL FIXATION (ORIF) DISTAL RADIAL FRACTURE Left 08/04/2022   Procedure: OPEN REDUCTION INTERNAL FIXATION (ORIF) DISTAL RADIUS FRACTURE;  Surgeon: Kennedy Bucker, MD;  Location: [facility] SURGERY [facility];  Service: Orthopedics;  Laterality: Left;   SENTINEL NODE BIOPSY Right 11/07/2016   Procedure: SENTINEL NODE BIOPSY;  Surgeon: Earline Mayotte, MD;  Location: [facility] ORS;  Service: General;  Laterality: Right;   SIMPLE MASTECTOMY WITH AXILLARY SENTINEL NODE BIOPSY Right 11/07/2016   6 mm ER/PR 100%; Her 2 neu not overexpressed, T1b, N0.  Surgeon: Earline Mayotte, MD;  Location: [facility] ORS;  Service: General;  Laterality: Right;   TEE WITHOUT CARDIOVERSION N/A 01/27/2021   Procedure: TRANSESOPHAGEAL ECHOCARDIOGRAM (TEE);  Surgeon: Lanier Prude, MD;  Location: [facility] INVASIVE CV LAB;  Service: Cardiovascular;  Laterality: N/A;   TEE WITHOUT CARDIOVERSION N/A 03/10/2021   Procedure: TRANSESOPHAGEAL ECHOCARDIOGRAM (TEE);  Surgeon: Parke Poisson, MD;  Location: [facility] ENDOSCOPY;  Service: Cardiology;  Laterality: N/A;   TONSILLECTOMY  AGE 45   UPPER GI ENDOSCOPY  01/12/06    hiatus hernia    Current Outpatient Medications  Medication Instructions   ALPRAZolam (XANAX) 0.5 mg, Oral, At bedtime PRN, for sleep   amiodarone (PACERONE) 100 mg, Oral, Daily   aspirin EC 81 mg, Oral, Daily, Swallow whole.   atorvastatin (LIPITOR) 40 MG tablet TAKE 1 TABLET BY MOUTH DAILY   bisacodyl 5 mg, Oral, Daily PRN   Calcium Carb-Cholecalciferol (CALCIUM 600 + D PO) 1 tablet, Oral, Daily   docusate sodium (COLACE) 100 mg, Oral, Daily PRN   hydroxypropyl methylcellulose / hypromellose (ISOPTO TEARS / GONIOVISC) 2.5 % ophthalmic solution 1 drop, Both Eyes, 4 times daily PRN   iron polysaccharides (NIFEREX) 150 mg, Oral, Daily, Or can take any over-the-counter iron supplement.   latanoprost (XALATAN) 0.005 % ophthalmic solution 1 drop, Both Eyes, Daily at bedtime   levothyroxine (SYNTHROID) 100 MCG tablet TAKE 1 TABLET BY MOUTH 30  MINUTES BEFORE BREAKFAST   meclizine (ANTIVERT) 25 mg, Oral, 3 times daily PRN  metoprolol succinate (TOPROL XL) 25 MG 24 hr tablet take 1/2 tab daily as needed for tachycardia or palpitation episodes.   Multiple Vitamin (MULTIVITAMIN WITH MINERALS) TABS tablet 1 tablet, Oral, Daily   omeprazole (PRILOSEC) 40 MG capsule TAKE 1 CAPSULE BY MOUTH DAILY   potassium chloride (KLOR-CON) 20 MEQ packet DISSOLVE 2 PACKETS IN 4 OZ WATER OR JUICE AND DRINK DAILY   sertraline (ZOLOFT) 100 mg, Oral, Daily   timolol (BETIMOL) 0.5 % ophthalmic solution 1 drop, Both Eyes, 2 times daily   Vitamin D3 5,000 Units, Oral, Daily    Social History:  The patient  reports that she has never smoked. She has never used smokeless tobacco. She reports that she does not currently use alcohol. She reports that she does not use drugs.   Family History:  The patient's family history includes Alcohol abuse in her father; Anemia in her mother; Breast cancer in her sister; Breast cancer (age of onset: 31) in her maternal grandmother; CAD in her paternal grandfather; Cancer in her  mother and sister; Heart attack in her maternal grandfather; Other in her mother; Prostate cancer in her father; Stroke in her maternal grandmother.  ROS:  Please see the history of present illness. All other systems are reviewed and otherwise negative.   PHYSICAL EXAM:  VS:  BP (!) 140/70 (BP Location: Left Arm, Patient Position: Sitting, Cuff Size: Normal) Comment: 2nd reading  Pulse (!) 53   Ht 5\' 3"  (1.6 m)   Wt 125 lb 6.4 oz (56.9 kg)   SpO2 95%   BMI 22.21 kg/m  BMI: Body mass index is 22.21 kg/m.  Vitals:   02/14/23 1313 02/14/23 1344 02/14/23 1350  BP: (!) 144/78 (!) 140/70 Comment: 2nd reading   Pulse: (!) 53  (!) 57 Comment: while walking, c/o headache  Height: 5\' 3"  (1.6 m)    Weight: 125 lb 6.4 oz (56.9 kg)    SpO2: 95%    BMI (Calculated): 22.22      GEN- The patient is well appearing, alert and oriented x 3 today.   Lungs- Clear to ausculation bilaterally, normal work of breathing.  Heart- Regular, bradycardic rate and rhythm, 3/6 murmur at LSB Extremities- No peripheral edema, warm, dry Skin-  ILR insertion site well-healed   Device interrogation done today and reviewed by myself:  Battery good Multiple AF episodes; overall burden 1.1% Histograms shifted left   EKG is not ordered. Personal review of EKG from  10/10/2022  shows:  EKG Interpretation Date/Time:  Wednesday February 14 2023 13:12:24 EDT Ventricular Rate:  53 PR Interval:  164 QRS Duration:  96 QT Interval:  492 QTC Calculation: 461 R Axis:   11  Text Interpretation: Sinus bradycardia Left ventricular hypertrophy with repolarization abnormality ( R in aVL , Sokolow-Lyon , Cornell product ) Confirmed by Sherie Don [phone]) on 02/14/2023 2:10:02 PM    10/10/2022:  NSR, rate 62     Recent Labs: 04/21/2022: B Natriuretic Peptide 691.3 01/18/2023: ALT 31; BUN 11; Creatinine, Ser 0.76; Hemoglobin 10.6; Magnesium 2.0; Platelets 290; Potassium 4.0; Sodium 141; TSH 4.060  No results found  for requested labs within last 365 days.   CrCl cannot be calculated (Patient's most recent lab result is older than the maximum 21 days allowed.).   Wt Readings from Last 3 Encounters:  02/14/23 125 lb 6.4 oz (56.9 kg)  02/05/23 129 lb 12.8 oz (58.9 kg)  01/18/23 129 lb 3.2 oz (58.6 kg)     Additional studies reviewed  include: Previous EP, cardiology notes.   TTE, 04/22/2022  1. Left ventricular ejection fraction, by estimation, is 60 to 65%. The left ventricle has normal function. The left ventricle has no regional wall motion abnormalities. There is mild left ventricular hypertrophy. Left ventricular diastolic parameters are indeterminate.   2. Right ventricular systolic function is normal. The right ventricular size is normal.   3. Left atrial size was moderately dilated.   4. The mitral valve is normal in structure. Mild mitral valve regurgitation.   5. The aortic valve is tricuspid. Aortic valve regurgitation is not visualized.   6. The inferior vena cava is normal in size with greater than 50% respiratory variability, suggesting right atrial pressure of 3 mmHg.   TEE, 03/10/2021 1. 24 mm Watchman FLX left atrial appendage occluder well seated, no residual flow. . Left atrial size was moderately dilated. No left atrial/left atrial appendage thrombus was detected.   2. Left ventricular ejection fraction, by estimation, is 60 to 65%. The left ventricle has normal function.   3. Right ventricular systolic function is normal. The right ventricular size is normal.   4. Right atrial size was moderately dilated.   5. The mitral valve is grossly normal.   6. The aortic valve is normal in structure. Aortic valve regurgitation is trivial.   7. There is mild (Grade II) atheroma plaque involving the transverse aorta.   8. Evidence of atrial level shunting detected by color flow Doppler. There is a small iatrogenic mid septal atrial septal defect with predominantly left to right shunting across  the atrial septum.   ASSESSMENT AND PLAN:  #) Tachy-brady syndrome #) parox AFib #) syncope Continues to have paroxysms of afib on 200mg  amiodarone daily Bradycardic when in sinus, pulse rose with activity of walking around clinic Patient c/o headache prior to walking and with activity Will lower amiodarone to 100mg  daily No recent episodes of syncope Continue to take 12.5mg  toprol PRN for tachycardia episodes Encouraged her to use symptom activator with episodes of LH, dizziness, or if she has fall  #) s/p LAAO with Watchman Follow-up imaging shows watchman well-seated with no residual flow Continue 81mg  ASA daily       Current medicines are reviewed at length with the patient today.   The patient does not have concerns regarding her medicines.  The following changes were made today:   REDUCE amiodarone to 100mg  daily   Labs/ tests ordered today include:  Orders Placed This Encounter  Procedures   EKG 12-Lead     Disposition: Follow up with Dr. Lalla Brothers or EP APP in 6 months   Signed, Sherie Don, NP  02/14/23  2:10 PM  Electrophysiology [facility]

## 2023-02-14 NOTE — Patient Instructions (Signed)
Medication Instructions:  Decrease Amiodarone to 100 mg daily   *If you need a refill on your cardiac medications before your next appointment, please call your pharmacy*   Follow-Up: At [facility], you and your health needs are our priority.  As part of our continuing mission to provide you with exceptional heart care, we have created designated Provider Care Teams.  These Care Teams include your primary Cardiologist (physician) and Advanced Practice Providers (APPs -  Physician Assistants and Nurse Practitioners) who all work together to provide you with the care you need, when you need it.  We recommend signing up for the patient portal called "MyChart".  Sign up information is provided on this After Visit Summary.  MyChart is used to connect with patients for Virtual Visits (Telemedicine).  Patients are able to view lab/test results, encounter notes, upcoming appointments, etc.  Non-urgent messages can be sent to your provider as well.   To learn more about what you can do with MyChart, go to [email or web address].    Your next appointment:   6 month(s)  Provider:   Sherie Don, NP    Other Instructions If you have any falls, syncope, lightheadedness, dizziness- please hit your symptom activator.

## 2023-02-21 ENCOUNTER — Ambulatory Visit
Admission: RE | Admit: 2023-02-21 | Discharge: 2023-02-21 | Disposition: A | Discharge status: Home / Self Care | Payer: Medicare Other | Source: Ambulatory Visit | Attending: Student in an Organized Health Care Education/Training Program | Admitting: Student in an Organized Health Care Education/Training Program

## 2023-02-21 ENCOUNTER — Telehealth: Payer: Self-pay

## 2023-02-21 ENCOUNTER — Other Ambulatory Visit: Payer: Self-pay

## 2023-02-21 DIAGNOSIS — I10 Essential (primary) hypertension: Secondary | ICD-10-CM | POA: Insufficient documentation

## 2023-02-21 DIAGNOSIS — R0600 Dyspnea, unspecified: Secondary | ICD-10-CM | POA: Insufficient documentation

## 2023-02-21 DIAGNOSIS — I480 Paroxysmal atrial fibrillation: Secondary | ICD-10-CM | POA: Diagnosis not present

## 2023-02-21 DIAGNOSIS — I340 Nonrheumatic mitral (valve) insufficiency: Secondary | ICD-10-CM | POA: Insufficient documentation

## 2023-02-21 DIAGNOSIS — R0602 Shortness of breath: Secondary | ICD-10-CM | POA: Diagnosis not present

## 2023-02-21 LAB — ECHOCARDIOGRAM COMPLETE
AR max vel: 2.83 cm2
AV Area VTI: 3.05 cm2
AV Area mean vel: 3.01 cm2
AV Mean grad: 4 mmHg
AV Peak grad: 7.7 mmHg
Ao pk vel: 1.39 m/s
Area-P 1/2: 3.24 cm2
S' Lateral: 2.4 cm

## 2023-02-21 MED ORDER — AMIODARONE HCL 200 MG PO TABS: 100.0000 mg | ORAL_TABLET | Freq: Every day | ORAL | 3 refills | Status: DC

## 2023-02-21 NOTE — Telephone Encounter (Signed)
I called and spoke with daughter (okay per DPR) advised that the pills were able to be cut in half, and she has been doing that already- I did advise I would update the medication to [facility] and on the medication list. Patient daughter verbalizes understanding, thankful for call back.

## 2023-02-21 NOTE — Telephone Encounter (Signed)
Received fax from patients pharmacy [facility]- they state that insurance coverage is only for Amiodarone 200 mg, they are wanting to know if she can cut this in half?   Advised I would reach out to NP.  Thanks!

## 2023-02-23 ENCOUNTER — Ambulatory Visit: Payer: Medicare Other | Admitting: Cardiology

## 2023-03-05 ENCOUNTER — Ambulatory Visit: Payer: Medicare Other

## 2023-03-05 DIAGNOSIS — R55 Syncope and collapse: Secondary | ICD-10-CM

## 2023-03-05 LAB — CUP PACEART REMOTE DEVICE CHECK
Date Time Interrogation Session: 20241006230253
Implantable Pulse Generator Implant Date: 20240522

## 2023-03-12 ENCOUNTER — Encounter: Payer: Self-pay | Admitting: Student in an Organized Health Care Education/Training Program

## 2023-03-12 ENCOUNTER — Other Ambulatory Visit: Payer: Self-pay | Admitting: Internal Medicine

## 2023-03-12 DIAGNOSIS — Z1231 Encounter for screening mammogram for malignant neoplasm of breast: Secondary | ICD-10-CM

## 2023-03-12 NOTE — Telephone Encounter (Signed)
CT is scheduled for 04/23/23 and I haven't tried to get the CT approved. I only do the PA 2 weeks in adavance

## 2023-03-15 ENCOUNTER — Other Ambulatory Visit: Payer: Self-pay | Admitting: Internal Medicine

## 2023-03-15 DIAGNOSIS — Z13820 Encounter for screening for osteoporosis: Secondary | ICD-10-CM

## 2023-03-20 NOTE — Progress Notes (Signed)
Carelink Summary Report / Loop Recorder 

## 2023-03-27 ENCOUNTER — Ambulatory Visit
Admission: RE | Admit: 2023-03-27 | Discharge: 2023-03-27 | Disposition: A | Discharge status: Home / Self Care | Payer: Medicare Other | Source: Ambulatory Visit | Attending: Internal Medicine | Admitting: Internal Medicine

## 2023-03-27 DIAGNOSIS — Z1231 Encounter for screening mammogram for malignant neoplasm of breast: Secondary | ICD-10-CM | POA: Insufficient documentation

## 2023-04-02 ENCOUNTER — Other Ambulatory Visit: Payer: Self-pay | Admitting: Physician Assistant

## 2023-04-04 ENCOUNTER — Encounter: Payer: Self-pay | Admitting: Internal Medicine

## 2023-04-04 ENCOUNTER — Other Ambulatory Visit: Payer: Self-pay | Admitting: Internal Medicine

## 2023-04-04 DIAGNOSIS — N644 Mastodynia: Secondary | ICD-10-CM

## 2023-04-05 ENCOUNTER — Encounter: Payer: Self-pay | Admitting: Student in an Organized Health Care Education/Training Program

## 2023-04-09 ENCOUNTER — Ambulatory Visit (INDEPENDENT_AMBULATORY_CARE_PROVIDER_SITE_OTHER): Payer: Medicare Other

## 2023-04-09 DIAGNOSIS — R55 Syncope and collapse: Secondary | ICD-10-CM

## 2023-04-09 NOTE — Telephone Encounter (Signed)
I only do PA's for procedure 2 weeks ahead. I notified [employer] this morning and Members plan doesn't require PA for the CT Refer # [id number]

## 2023-04-10 ENCOUNTER — Inpatient Hospital Stay: Admission: RE | Admit: 2023-04-10 | Payer: Medicare Other | Source: Ambulatory Visit

## 2023-04-10 LAB — CUP PACEART REMOTE DEVICE CHECK
Date Time Interrogation Session: 20241108231029
Implantable Pulse Generator Implant Date: 20240522

## 2023-04-11 ENCOUNTER — Ambulatory Visit
Admission: RE | Admit: 2023-04-11 | Discharge: 2023-04-11 | Disposition: A | Discharge status: Home / Self Care | Payer: Medicare Other | Source: Ambulatory Visit | Attending: Internal Medicine | Admitting: Internal Medicine

## 2023-04-11 ENCOUNTER — Ambulatory Visit
Admission: RE | Admit: 2023-04-11 | Discharge: 2023-04-11 | Disposition: A | Discharge status: Home / Self Care | Payer: Medicare Other | Source: Ambulatory Visit | Attending: Internal Medicine

## 2023-04-11 DIAGNOSIS — N644 Mastodynia: Secondary | ICD-10-CM | POA: Insufficient documentation

## 2023-04-13 ENCOUNTER — Other Ambulatory Visit: Payer: Medicare Other

## 2023-04-17 ENCOUNTER — Other Ambulatory Visit: Payer: Medicare Other

## 2023-04-23 ENCOUNTER — Other Ambulatory Visit: Payer: Medicare Other

## 2023-04-23 ENCOUNTER — Ambulatory Visit: Payer: Medicare Other

## 2023-05-04 NOTE — Progress Notes (Signed)
Carelink Summary Report / Loop Recorder 

## 2023-05-08 ENCOUNTER — Ambulatory Visit: Payer: Medicare Other | Admitting: Student in an Organized Health Care Education/Training Program

## 2023-05-13 LAB — CUP PACEART REMOTE DEVICE CHECK
Date Time Interrogation Session: 20241213230749
Implantable Pulse Generator Implant Date: 20240522

## 2023-05-14 ENCOUNTER — Ambulatory Visit (INDEPENDENT_AMBULATORY_CARE_PROVIDER_SITE_OTHER): Payer: Medicare Other

## 2023-05-14 DIAGNOSIS — R55 Syncope and collapse: Secondary | ICD-10-CM | POA: Diagnosis not present

## 2023-05-15 ENCOUNTER — Ambulatory Visit: Payer: Medicare Other | Admitting: Student in an Organized Health Care Education/Training Program

## 2023-05-15 ENCOUNTER — Encounter: Payer: Self-pay | Admitting: Student in an Organized Health Care Education/Training Program

## 2023-05-15 VITALS — BP 116/56 | HR 54 | Temp 97.8°F | Ht 63.0 in | Wt 125.0 lb

## 2023-05-15 DIAGNOSIS — R0602 Shortness of breath: Secondary | ICD-10-CM

## 2023-05-15 DIAGNOSIS — R918 Other nonspecific abnormal finding of lung field: Secondary | ICD-10-CM
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
## 2023-05-31 ENCOUNTER — Telehealth: Payer: Self-pay | Admitting: Internal Medicine

## 2023-05-31 NOTE — Telephone Encounter (Signed)
 Daughter Duaine) stated patient has been having Afib episodes ranging from 105-110 HR on 12/27.  Daughter noted patient has also been having increased unstable gait.  Daughter wants to know if patient is patient's loop recorder results have been reviewed and is concerned if patient would be considered for a pacemaker.

## 2023-05-31 NOTE — Telephone Encounter (Signed)
 The patient's daughter called, expressing concern about the patient. She stated that on 12/27, the patient experienced an episode of a AFib, with a heart rate averaging between 107-110. She reported that the patient required 3 doses of Metoprolol  12.5 mg and finally woke up the next morning back in normal sinus rhythm.  The daughter also mentioned that she is concerned because the patient's gait has been more unsteady over the past month and a half, and the patient has been using her cane much more. She noted that the patient did not mention feeling lightheaded or dizzy but reported it is difficult for the patient to commit to her words. However, the daughter can tell that the patient overall doesn't feel well and notices the patient gets lightheaded when bending over. The daughter is concerned that the patient may be either orthostatic or bradycardic. She also mentioned that the patient's color doesn't look good.  The daughter is concerned that the patient may need a pacemaker and is asking for an updated loop transmission. She also reported that the patient has some shortness of breath, but since she lives in [place] , she is unsure if the patient has any swelling.  Nurse attempted to contact pt via phone for more information regarding her symptoms. Nurse left message for pt to call back.  Will forward to device clinic for transmission and Suzann for recommendations.

## 2023-05-31 NOTE — Telephone Encounter (Signed)
 The patient returned the call and provided the following information:  She stated that she feels stress the night before may have triggered her AFib episode. She reported feeling lightheaded and lacking energy during those times, and stated the episode lasted for 2 days. The patient also mentioned feeling a little unsteady on her feet and needing to use her cane more often, but she believes this is related to her prior stroke. She noted having several falls over the past few months. Additionally, the patient reported SOB with exertion, but denies swelling and believes the SOB is related to her lung nodules.   12/27-156/119 HR 107             157/97 HR 105 4 pm  12/28-157/98 HR 101 9 am             12/29-152/60 HR 50 -8 am Pt reported feeling very unsteady on this day   12/30-185/81 HR 58 -7 am (feeling better)  12/31-185/79 HR 59 - 8 am (feeling better)  Nurse scheduled pt an appointment on 06/05/22 with Dr. Mady   Pt made aware of ED precaution should any new symptoms develop or worsen.

## 2023-05-31 NOTE — Telephone Encounter (Signed)
 Remote transmission reviewed. No brady or pause episodes noted. AF burden 0.3% with max ventricular rate 133 bpm. Patients in NSR on 05/30/22. Will await for recommendations.

## 2023-06-01 MED ORDER — METOPROLOL TARTRATE 25 MG PO TABS: 12.5000 mg | ORAL_TABLET | ORAL | 3 refills | Status: DC | PRN

## 2023-06-01 NOTE — Telephone Encounter (Signed)
 Pt made aware and verbalized understanding.    Riddle, Suzann, NP  You12 hours ago (9:02 PM)    Her PRN dose of metoprolol  is the long-acting formulation. Can you send in the shorter acting metop tartrate - same sig: 12.5mg  PRN for Afib episodes with HR > 120bpm.

## 2023-06-06 ENCOUNTER — Encounter: Payer: Self-pay | Admitting: Internal Medicine

## 2023-06-06 ENCOUNTER — Ambulatory Visit: Payer: Medicare (Managed Care) | Attending: Internal Medicine | Admitting: Internal Medicine

## 2023-06-06 ENCOUNTER — Ambulatory Visit
Admission: RE | Admit: 2023-06-06 | Discharge: 2023-06-06 | Disposition: A | Discharge status: Home / Self Care | Payer: Medicare (Managed Care) | Source: Ambulatory Visit | Attending: Internal Medicine | Admitting: Internal Medicine

## 2023-06-06 ENCOUNTER — Telehealth: Payer: Self-pay | Admitting: Internal Medicine

## 2023-06-06 VITALS — BP 158/74 | HR 49 | Ht 63.0 in | Wt 127.0 lb

## 2023-06-06 DIAGNOSIS — R42 Dizziness and giddiness: Secondary | ICD-10-CM

## 2023-06-06 DIAGNOSIS — R296 Repeated falls: Secondary | ICD-10-CM

## 2023-06-06 DIAGNOSIS — R9431 Abnormal electrocardiogram [ECG] [EKG]: Secondary | ICD-10-CM

## 2023-06-06 DIAGNOSIS — I495 Sick sinus syndrome: Secondary | ICD-10-CM | POA: Diagnosis not present

## 2023-06-06 DIAGNOSIS — I10 Essential (primary) hypertension: Secondary | ICD-10-CM

## 2023-06-06 DIAGNOSIS — Z8679 Personal history of other diseases of the circulatory system: Secondary | ICD-10-CM

## 2023-06-06 DIAGNOSIS — I480 Paroxysmal atrial fibrillation: Secondary | ICD-10-CM

## 2023-06-06 DIAGNOSIS — I619 Nontraumatic intracerebral hemorrhage, unspecified: Secondary | ICD-10-CM

## 2023-06-06 DIAGNOSIS — I5032 Chronic diastolic (congestive) heart failure: Secondary | ICD-10-CM

## 2023-06-06 DIAGNOSIS — Z79899 Other long term (current) drug therapy: Secondary | ICD-10-CM

## 2023-06-06 MED ORDER — LOSARTAN POTASSIUM 25 MG PO TABS: 25.0000 mg | ORAL_TABLET | Freq: Every day | ORAL | 11 refills | Status: DC

## 2023-06-06 NOTE — Telephone Encounter (Signed)
 Patient's head CT results d/w patient.  It did not show any acute abnormality.  Linq interrogation shows episode of a-fib in the Sandra [name] of December; awaiting rhythm strips for further evaluation.  When not in a-fib, heart rates are almost entirely below 60 bpm.  Given elevated BP today and at home over the last 2 weeks, we will begin losartan  25 mg daily.  Labs drawn today are pending; plan for repeat BMP in ~2 weeks.  Lonni Hanson, MD [facility]

## 2023-06-06 NOTE — Progress Notes (Signed)
 Cardiology Office Note:  .   Date:  06/07/2023  ID:  Sandra [name] Level, DOB 10-08-1945, MRN [id number] PCP: Sampson Ethridge LABOR, MD  [facility] Providers Cardiologist:  Lonni Hanson, MD Electrophysiologist:  OLE ONEIDA HOLTS, MD     History of Present Illness: .   Sandra [name] is a 78 y.o. female with history of paroxysmal atrial fibrillation complicated by intracranial hemorrhage in 06/2020 in the setting of recurrent falls status post Watchman implantation, HFpEF, right-sided breast cancer, celiac disease, Raunaud's disease, hypothyroidism, RLS, GERD, and anxiety, who presents for follow-up of atrial fibrillation.  She was last seen in our office in 01/2023 by Sandra Riddle, NP, due to concerns about bradycardia and increased atrial fibrillation burden.  When not in a-fib, home HR readings at rest were often in the 40's and 50's.  Amiodarone  was decreased to 100 mg daily.  Sandra [name] is joined today by telephone by her daughter.  Sandra [name] is concerned about intermittent dizziness and poor balance.  She fell twice over the last 6 weeks, striking her head.  Since then, she feels like her balance has gotten even worse.  She also reports an episode of atrial fibrillation on December 27 and 28, during which she noticed elevated heart rates and lightheadedness.  She wadded up taking 3 doses of her as needed metoprolol  succinate, with the symptoms abating after about a day.  She has not had any chest pain.  She has felt a little bit more short of breath over the last 6 months.  She has not had any frank lower extremity edema other than some localized swelling along the right foot where she believes that she may have a stress fracture.  She remains on aspirin  without significant bleeding.  She has not followed with neurology in a few years.  Loop recorder monitoring continues; her daughter is interested in further workup for a pacemaker.  ROS: See HPI  Studies Reviewed: [name]   EKG  Interpretation Date/Time:  Wednesday June 06 2023 10:56:42 EST Ventricular Rate:  49 PR Interval:  166 QRS Duration:  94 QT Interval:  512 QTC Calculation: 462 R Axis:   2  Text Interpretation: Sinus bradycardia Left ventricular hypertrophy ( R in aVL , Sokolow-Lyon , Cornell product ) Marked ST abnormality, possible inferior subendocardial injury versus abnormal repolarization When compared with ECG of 14-Feb-2023 13:12, No significant change was found Confirmed by Sandra [name] [phone]) on 06/07/2023 7:23:32 AM    TTE (02/21/2023):  1. Left ventricular ejection fraction, by estimation, is 60 to 65%. The  left ventricle has normal function. The left ventricle has no regional  wall motion abnormalities. Left ventricular diastolic parameters were  normal.   2. Right ventricular systolic function is normal. The right ventricular  size is normal. There is normal pulmonary artery systolic pressure. The  estimated right ventricular systolic pressure is 32.2 mmHg.   3. Left atrial size was mildly dilated.   4. The mitral valve is normal in structure. Moderate mitral valve  regurgitation. No evidence of mitral stenosis.   5. The aortic valve is tricuspid. Aortic valve regurgitation is not  visualized. No aortic stenosis is present.   6. The inferior vena cava is normal in size with greater than 50%  respiratory variability, suggesting right atrial pressure of 3 mmHg.   Risk Assessment/Calculations:    CHA2DS2-VASc Score = 5   This indicates a 7.2% annual risk of stroke. The patient's score is based upon: CHF History: 0  HTN History: 1 Diabetes History: 0 Stroke History: 0 Vascular Disease History: 1 Age Score: 2 Gender Score: 1        Physical Exam:   VS:  BP (!) 158/74   Pulse (!) 49   Ht 5' 3 (1.6 m)   Wt 127 lb (57.6 kg)   SpO2 98%   BMI 22.50 kg/m    Wt Readings from Last 3 Encounters:  06/06/23 127 lb (57.6 kg)  05/15/23 125 lb (56.7 kg)  02/14/23 125 lb 6.4 oz  (56.9 kg)   Position Blood pressure (mmHg) Heart rate (bpm)  Lying 179/86 49  Sitting 173/79 49  Standing 171/82 48  Standing (3 minutes) 176/81 47   General:  NAD. Neck: No JVD or HJR. Lungs: Clear to auscultation bilaterally without wheezes or crackles. Heart: Bradycardic but regular with 1/6 systolic murmur. Abdomen: Soft, nontender, nondistended. Extremities: No lower extremity edema.  ASSESSMENT AND PLAN: .    Paroxysmal atrial fibrillation, tachybrady syndrome, recurrent falls, and dizziness: Sandra [name] continues to be primarily bradycardic in sinus rhythm.  Review of her heart rate histograms dating back to October show almost constant heart rates below 60 beats per minutes.  Only about 20% of time is her heart rate between 60 and 70 bpm.  She had an episode of atrial fibrillation lasting several hours on December 27 and 28 with ventricular rates primarily in the low 100s.  She has been evaluated by EP multiple times in the past and was not felt to need a pacemaker.  She is not on any standing AV nodal blocking agents, though she is on timolol  eyedrops.  I have encouraged her to speak with her ophthalmologist about switching this to an alternative agent.  Though her gait instability and shortness of breath are likely multifactorial, I worry that some degree of chronotropic incompetence is contributing.  We will try to arrange for follow-up with Sandra [name] to readdress need for pacemaker placement.  Continue amiodarone  100 mg daily.  She is status post watchman with history of intracranial hemorrhage and recurrent falls.  Given at least 2 additional falls over the last 6 weeks and worsening dizziness, we will obtain a stat CT of the head today to ensure that she does not have any evidence of recurrent bleeding.  I will check a CBC, BMP, and TSH today.  I also think Sandra [name] may benefit from reevaluation by neurology.  Chronic HFpEF: Volume status appears stable.  No need for standing  diuretic.  Hypertension and abnormal EKG: Blood pressure has been quite elevated at home over the last 2 to 3 weeks, similar to today's readings in the office.  Though Sandra [name] reports some postural dizziness, her orthostatic vital signs today are without significant drop.  Assuming her head CT is unremarkable and her renal function/electrolytes appropriate, we will plan to add losartan  25 mg daily and repeat a BMP in about 2 weeks.  Looking back at her EKGs over several years, LVH and ST/T changes most suggestive of abnormal repolarization has been persistent.  Most recent echocardiogram in September did not comment on LVH, though there appears to be at least mild thickening of the septum.  Query if she could have apical hypertrophic cardiomyopathy not evident on echo.  We may need to consider cardiac MRI to exclude HCM.  I do not think that ventricular arrhythmias are contributing to her dizziness/falls based on loop recorder results.    Dispo: Return to clinic in 1 month.  Signed,  Lonni Hanson, MD

## 2023-06-06 NOTE — Patient Instructions (Addendum)
 Medication Instructions:  Your physician recommends that you continue on your current medications as directed. Please refer to the Current Medication list given to you today.   *If you need a refill on your cardiac medications before your next appointment, please call your pharmacy*   Lab Work: Your provider would like for you to have labs drawn today (CBC, BMP, TSH) then return in 2 weeks to have the following labs drawn: (BMP).   Please go to [facility] [address] ([facility] [address], [place] [address] You do not need an appointment.  They are open from 8 am- 5 pm.  Lunch from 1:00 pm- 2:00 pm You will not need to be fasting.   Testing/Procedures: Head CT scanning, (CAT scanning), is a noninvasive, special x-ray that produces cross-sectional images of the body using x-rays and a computer. CT scans help physicians diagnose and treat medical conditions. For some CT exams, a contrast material is used to enhance visibility in the area of the body being studied. CT scans provide greater clarity and reveal more details than regular x-ray exams.  [facility] [address]  [place], [place] [address]  Time: 2:45 pm   Follow-Up: At [facility], you and your health needs are our priority.  As part of our continuing mission to provide you with exceptional heart care, we have created designated Provider Care Teams.  These Care Teams include your primary Cardiologist (physician) and Advanced Practice Providers (APPs -  Physician Assistants and Nurse Practitioners) who all work together to provide you with the care you need, when you need it.  We recommend signing up for the patient portal called MyChart.  Sign up information is provided on this After Visit Summary.  MyChart is used to connect with patients for Virtual Visits (Telemedicine).  Patients are able to view lab/test results, encounter notes, upcoming  appointments, etc.  Non-urgent messages can be sent to your provider as well.   To learn more about what you can do with MyChart, go to [email or web address].    Your next appointment:   1 month(s)  Provider:   You may see Lonni Hanson, MD or one of the following Advanced Practice Providers on your designated Care Team:   Lonni Meager, NP Bernardino Bring, PA-C Cadence Franchester, PA-C Tylene Lunch, NP Barnie Hila, NP

## 2023-06-07 ENCOUNTER — Encounter: Payer: Self-pay | Admitting: Internal Medicine

## 2023-06-07 ENCOUNTER — Encounter: Payer: Self-pay | Admitting: Emergency Medicine

## 2023-06-07 LAB — BASIC METABOLIC PANEL
BUN/Creatinine Ratio: 17 (ref 12–28)
BUN: 13 mg/dL (ref 8–27)
CO2: 24 mmol/L (ref 20–29)
Calcium: 8.7 mg/dL (ref 8.7–10.3)
Chloride: 105 mmol/L (ref 96–106)
Creatinine, Ser: 0.76 mg/dL (ref 0.57–1.00)
Glucose: 91 mg/dL (ref 70–99)
Potassium: 4.7 mmol/L (ref 3.5–5.2)
Sodium: 144 mmol/L (ref 134–144)
eGFR: 80 mL/min/{1.73_m2} (ref >59)

## 2023-06-07 LAB — CBC
Hematocrit: 37.5 % (ref 34.0–46.6)
Hemoglobin: 11.8 g/dL (ref 11.1–15.9)
MCH: 29.6 pg (ref 26.6–33.0)
MCHC: 31.5 g/dL (ref 31.5–35.7)
MCV: 94 fL (ref 79–97)
Platelets: 221 10*3/uL (ref 150–450)
RBC: 3.98 x10E6/uL (ref 3.77–5.28)
RDW: 13.1 % (ref 11.7–15.4)
WBC: 5.3 10*3/uL (ref 3.4–10.8)

## 2023-06-07 LAB — TSH: TSH: 3.81 u[IU]/mL (ref 0.450–4.500)

## 2023-06-18 ENCOUNTER — Encounter: Payer: Self-pay | Admitting: Cardiology

## 2023-06-18 ENCOUNTER — Ambulatory Visit (INDEPENDENT_AMBULATORY_CARE_PROVIDER_SITE_OTHER): Payer: Medicare Other

## 2023-06-18 DIAGNOSIS — R55 Syncope and collapse: Secondary | ICD-10-CM

## 2023-06-18 LAB — CUP PACEART REMOTE DEVICE CHECK
Date Time Interrogation Session: 20250119232251
Implantable Pulse Generator Implant Date: 20240522

## 2023-06-19 NOTE — Progress Notes (Signed)
Carelink Summary Report / Loop Recorder 

## 2023-06-20 ENCOUNTER — Other Ambulatory Visit: Payer: Self-pay

## 2023-06-20 DIAGNOSIS — Z79899 Other long term (current) drug therapy: Secondary | ICD-10-CM

## 2023-06-21 ENCOUNTER — Telehealth: Payer: Self-pay

## 2023-06-21 DIAGNOSIS — Z79899 Other long term (current) drug therapy: Secondary | ICD-10-CM

## 2023-06-21 LAB — BASIC METABOLIC PANEL
BUN/Creatinine Ratio: 16 (ref 12–28)
BUN: 13 mg/dL (ref 8–27)
CO2: 25 mmol/L (ref 20–29)
Calcium: 8.9 mg/dL (ref 8.7–10.3)
Chloride: 103 mmol/L (ref 96–106)
Creatinine, Ser: 0.81 mg/dL (ref 0.57–1.00)
Glucose: 76 mg/dL (ref 70–99)
Potassium: 3.9 mmol/L (ref 3.5–5.2)
Sodium: 142 mmol/L (ref 134–144)
eGFR: 74 mL/min/{1.73_m2} (ref >59)

## 2023-06-21 MED ORDER — LOSARTAN POTASSIUM 50 MG PO TABS: 50.0000 mg | ORAL_TABLET | Freq: Every day | ORAL | 11 refills | Status: DC

## 2023-06-21 NOTE — Telephone Encounter (Signed)
The patient called to report that, despite starting losartan 25 mg daily, she continues to experience elevated blood pressure. She noted that she takes her medication at night, and the readings provided are primarily from when she first wakes up in the morning. The patient mentioned she is asymptomatic, except for a headache yesterday.  Additionally, the patient stated she is under significant stress from caring for her husband, who she reported is in the final stages of COPD.  1/10-168/88  1/11-182/85 1/12-156/74 1/13-185/86 1/14-180/96 1/15-184/84 1/16-186/83 1/17-168/79 1/18-183/78  189/81 (PM) 1/19-188/79 1/20-175/80 1/21-172/79 173/79 (PM) 1/22-178/77 1/23-186/87  Will forward to MD for review.

## 2023-06-21 NOTE — Telephone Encounter (Signed)
Pt made aware and verbalized understanding.   Medication list updated and repeat lab order placed

## 2023-06-21 NOTE — Telephone Encounter (Signed)
Recommend increasing losartan to 50 mg daily with repeat BMP when she sees Dr. Lalla Brothers on 07/11/2023.  Yvonne Kendall, MD [facility]

## 2023-07-03 ENCOUNTER — Ambulatory Visit: Payer: Medicare (Managed Care) | Admitting: Physician Assistant

## 2023-07-10 NOTE — Progress Notes (Unsigned)
Electrophysiology Office Follow up Visit Note:    Date:  07/11/2023   ID:  Sandra [name], DOB [birth year], MRN [id number]  PCP:  Louis Matte, MD  [facility] Cardiologist:  Yvonne Kendall, MD  [facility] Electrophysiologist:  Lanier Prude, MD    Interval History:     Sandra [name] is a 78 y.o. female who presents for a follow up visit.   The patient most recently saw Dr. Okey Dupre June 06, 2023.  At that appointment she reported several falls.  She also reported at least 2 episodes of atrial fibrillation. Her gait instability and shortness of breath are thought to be multifactorial.  Chronotropic incompetence is thought to be 1 potential contributor to her gait instability.  She was set up to see me again today in clinic for follow-up to discuss possible pacemaker implant. Possible apical variant hypertrophic cardiomyopathy was also considered as a potential cause.  Cardiac MRI was being considered.  She is doing well today. No falls this year. She confirms no loss of consciousness during previous falls. No presyncope. She describes the falls as secondary to gait instability. Daughter (nurse) joins today via phone from [facility].        Past medical, surgical, social and family history were reviewed.  ROS:   Please see the history of present illness.    All other systems reviewed and are negative.  EKGs/Labs/Other Studies Reviewed:    The following studies were reviewed today:  June 18, 2023 Loop recorder interrogation personally reviewed A-fib burden 0.7%        Physical Exam:    VS:  BP (!) 166/72   Pulse 64   Ht 5\' 3"  (1.6 m)   Wt 124 lb (56.2 kg)   SpO2 97%   BMI 21.97 kg/m     Wt Readings from Last 3 Encounters:  07/11/23 124 lb (56.2 kg)  06/06/23 127 lb (57.6 kg)  05/15/23 125 lb (56.7 kg)     GEN: no distress CARD: RRR, No MRG RESP: No IWOB. CTAB.      ASSESSMENT:    1. Chronic heart failure with preserved ejection fraction  (HCC)   2. Tachycardia-bradycardia syndrome (HCC)   3. Dizziness   4. Syncope and collapse    PLAN:    In order of problems listed above:  #Paroxysmal atrial fibrillation #Dizziness The patient is bradycardic with heart rates between 50 and 60 the majority of the time on her loop recorder.  Given her description of the prior falls, I do not think bradycardia is the culprit.  I suspect her falls are secondary to gait instability and prior stroke.  We reviewed her loop recorder information in detail.  She has no severe bradycardia.  No pauses.  Her prior falls are not around the time of atrial fibrillation.  We discussed the risks associated with pacemaker implant.  For now I have recommended proceeding with her current medical therapy.  Recommend avoiding pacemaker implant at this time.    She will continue with amiodarone 100 mg by mouth once daily for rhythm control.  We discussed indications for as needed metoprolol.    #Hypertension Above goal today.  Recommend checking blood pressures 1-2 times per week at home and recording the values.  Recommend bringing these recordings to the primary care physician. Increase losartan to 75 mg by mouth daily   #Chronic diastolic heart failure #Possible LVH NYHA class II.  Warm dry on exam. Cardiac MRI to assess LV dimensions,  LGE     Signed, Steffanie Dunn, MD, [facility], [facility] 07/11/2023 9:19 AM    Electrophysiology [facility]

## 2023-07-11 ENCOUNTER — Ambulatory Visit: Payer: Medicare (Managed Care) | Attending: Cardiology | Admitting: Cardiology

## 2023-07-11 ENCOUNTER — Encounter: Payer: Self-pay | Admitting: Internal Medicine

## 2023-07-11 VITALS — BP 166/72 | HR 64 | Ht 63.0 in | Wt 124.0 lb

## 2023-07-11 DIAGNOSIS — I495 Sick sinus syndrome: Secondary | ICD-10-CM | POA: Diagnosis not present

## 2023-07-11 DIAGNOSIS — I5032 Chronic diastolic (congestive) heart failure: Secondary | ICD-10-CM

## 2023-07-11 DIAGNOSIS — R55 Syncope and collapse: Secondary | ICD-10-CM | POA: Diagnosis not present

## 2023-07-11 DIAGNOSIS — R42 Dizziness and giddiness: Secondary | ICD-10-CM

## 2023-07-11 MED ORDER — POTASSIUM CHLORIDE 20 MEQ PO PACK: PACK | ORAL | 3 refills | Status: AC

## 2023-07-11 MED ORDER — ATORVASTATIN CALCIUM 40 MG PO TABS: 40.0000 mg | ORAL_TABLET | Freq: Every day | ORAL | 3 refills | Status: DC

## 2023-07-11 MED ORDER — AMIODARONE HCL 200 MG PO TABS: 100.0000 mg | ORAL_TABLET | Freq: Every day | ORAL | 3 refills | Status: DC

## 2023-07-11 MED ORDER — LOSARTAN POTASSIUM 50 MG PO TABS: 75.0000 mg | ORAL_TABLET | Freq: Every day | ORAL | 3 refills | Status: DC

## 2023-07-11 NOTE — Patient Instructions (Addendum)
Medication Instructions:  Your physician has recommended you make the following change in your medication:  1) INCREASE losartan to 75 mg daily *If you need a refill on your cardiac medications before your next appointment, please call your pharmacy*  Testing/Procedures: Cardiac MRI Your physician has requested that you have a cardiac MRI. Cardiac MRI uses a computer to create images of your heart as its beating, producing both still and moving pictures of your heart and major blood vessels. For further information please visit [email or web address]. Please follow the instruction sheet given to you today for more information.  Follow-Up: At [facility], you and your health needs are our priority.  As part of our continuing mission to provide you with exceptional heart care, we have created designated Provider Care Teams.  These Care Teams include your primary Cardiologist (physician) and Advanced Practice Providers (APPs -  Physician Assistants and Nurse Practitioners) who all work together to provide you with the care you need, when you need it.  Your next appointment:   6 months  Provider:   You will see one of the following Advanced Practice Providers on your designated Care Team:   Francis Dowse, Charlott Holler "Mardelle Matte" [place], [place] Sherie Don, NP Canary Brim, NP  Other Instructions Please schedule an appointment with your primary care provider in the next 2-4 weeks  Cardiac MRI  You are scheduled for Cardiac MRI at the location below.  Please arrive for your appointment at ______________ . ?  [facility] [address] [place], [place] [address] Please take advantage of the free valet parking available at the [facility] and [employer] (Entrance C).  Proceed to the [facility] Radiology Department (First Floor) for check-in.   OR   [facility] [address] [place], [place] [address] Please go to the [facility] and  check-in with the desk attendant.   Magnetic resonance imaging (MRI) is a painless test that produces images of the inside of the body without using Xrays.  During an MRI, strong magnets and radio waves work together in a Data processing manager to form detailed images.   MRI images may provide more details about a medical condition than X-rays, CT scans, and ultrasounds can provide.  You may be given earphones to listen for instructions.  You may eat a light breakfast and take medications as ordered with the exception of furosemide, hydrochlorothiazide, chlorthalidone or spironolactone (or any other fluid pill). If you are undergoing a stress MRI, please avoid stimulants for 12 hr prior to test. (I.e. Caffeine, nicotine, chocolate, or antihistamine medications)  An IV will be inserted into one of your veins. Contrast material will be injected into your IV. It will leave your body through your urine within a day. You may be told to drink plenty of fluids to help flush the contrast material out of your system.  You will be asked to remove all metal, including: Watch, jewelry, and other metal objects including hearing aids, hair pieces and dentures. Also wearable glucose monitoring systems (ie. Freestyle Libre and Omnipods) (Braces and fillings normally are not a problem.)   TEST WILL TAKE APPROXIMATELY 1 HOUR  PLEASE NOTIFY SCHEDULING AT LEAST 24 HOURS IN ADVANCE IF YOU ARE UNABLE TO KEEP YOUR APPOINTMENT. [phone]  For more information and frequently asked questions, please visit our website : [email or web address]  Please call the Cardiac Imaging Nurse Navigators with any questions/concerns. [phone] Office

## 2023-07-12 ENCOUNTER — Telehealth: Payer: Self-pay | Admitting: Internal Medicine

## 2023-07-12 NOTE — Telephone Encounter (Signed)
Sandra [name] with [facility] called in stating she received a request for records. She states they took over [employer] and all their records are still in the process of merging over. She states she only has lab results, no OV notes or EKG. She asked was this okay to send and if the medical records were still needed. She states it was requested by Feliberto Harts.

## 2023-07-20 ENCOUNTER — Inpatient Hospital Stay
Admission: EM | Admit: 2023-07-20 | Discharge: 2023-07-30 | DRG: 521 | Disposition: A | Discharge status: Home Health | Payer: Medicare (Managed Care) | Attending: Student | Admitting: Student

## 2023-07-20 ENCOUNTER — Other Ambulatory Visit: Payer: Self-pay

## 2023-07-20 ENCOUNTER — Emergency Department: Payer: Medicare (Managed Care)

## 2023-07-20 DIAGNOSIS — I951 Orthostatic hypotension: Secondary | ICD-10-CM | POA: Diagnosis not present

## 2023-07-20 DIAGNOSIS — I2510 Atherosclerotic heart disease of native coronary artery without angina pectoris: Secondary | ICD-10-CM | POA: Diagnosis present

## 2023-07-20 DIAGNOSIS — F419 Anxiety disorder, unspecified: Secondary | ICD-10-CM | POA: Diagnosis present

## 2023-07-20 DIAGNOSIS — I5032 Chronic diastolic (congestive) heart failure: Secondary | ICD-10-CM | POA: Diagnosis not present

## 2023-07-20 DIAGNOSIS — R7303 Prediabetes: Secondary | ICD-10-CM | POA: Diagnosis present

## 2023-07-20 DIAGNOSIS — K219 Gastro-esophageal reflux disease without esophagitis: Secondary | ICD-10-CM | POA: Insufficient documentation

## 2023-07-20 DIAGNOSIS — Z823 Family history of stroke: Secondary | ICD-10-CM

## 2023-07-20 DIAGNOSIS — Z8042 Family history of malignant neoplasm of prostate: Secondary | ICD-10-CM

## 2023-07-20 DIAGNOSIS — J9601 Acute respiratory failure with hypoxia: Secondary | ICD-10-CM | POA: Diagnosis not present

## 2023-07-20 DIAGNOSIS — Z8249 Family history of ischemic heart disease and other diseases of the circulatory system: Secondary | ICD-10-CM | POA: Diagnosis not present

## 2023-07-20 DIAGNOSIS — I69398 Other sequelae of cerebral infarction: Secondary | ICD-10-CM

## 2023-07-20 DIAGNOSIS — I493 Ventricular premature depolarization: Secondary | ICD-10-CM | POA: Diagnosis present

## 2023-07-20 DIAGNOSIS — D509 Iron deficiency anemia, unspecified: Secondary | ICD-10-CM | POA: Diagnosis present

## 2023-07-20 DIAGNOSIS — E039 Hypothyroidism, unspecified: Secondary | ICD-10-CM | POA: Diagnosis present

## 2023-07-20 DIAGNOSIS — Z5982 Transportation insecurity: Secondary | ICD-10-CM

## 2023-07-20 DIAGNOSIS — Y92511 Restaurant or cafe as the place of occurrence of the external cause: Secondary | ICD-10-CM

## 2023-07-20 DIAGNOSIS — I5033 Acute on chronic diastolic (congestive) heart failure: Secondary | ICD-10-CM | POA: Diagnosis not present

## 2023-07-20 DIAGNOSIS — I480 Paroxysmal atrial fibrillation: Secondary | ICD-10-CM | POA: Diagnosis not present

## 2023-07-20 DIAGNOSIS — Z9011 Acquired absence of right breast and nipple: Secondary | ICD-10-CM

## 2023-07-20 DIAGNOSIS — Z811 Family history of alcohol abuse and dependence: Secondary | ICD-10-CM

## 2023-07-20 DIAGNOSIS — I110 Hypertensive heart disease with heart failure: Secondary | ICD-10-CM | POA: Diagnosis present

## 2023-07-20 DIAGNOSIS — S72002A Fracture of unspecified part of neck of left femur, initial encounter for closed fracture: Secondary | ICD-10-CM | POA: Diagnosis not present

## 2023-07-20 DIAGNOSIS — M810 Age-related osteoporosis without current pathological fracture: Secondary | ICD-10-CM | POA: Diagnosis present

## 2023-07-20 DIAGNOSIS — Z79890 Hormone replacement therapy (postmenopausal): Secondary | ICD-10-CM

## 2023-07-20 DIAGNOSIS — I7300 Raynaud's syndrome without gangrene: Secondary | ICD-10-CM | POA: Diagnosis present

## 2023-07-20 DIAGNOSIS — Z974 Presence of external hearing-aid: Secondary | ICD-10-CM

## 2023-07-20 DIAGNOSIS — M159 Polyosteoarthritis, unspecified: Secondary | ICD-10-CM | POA: Diagnosis present

## 2023-07-20 DIAGNOSIS — G2581 Restless legs syndrome: Secondary | ICD-10-CM | POA: Diagnosis present

## 2023-07-20 DIAGNOSIS — W010XXA Fall on same level from slipping, tripping and stumbling without subsequent striking against object, initial encounter: Secondary | ICD-10-CM | POA: Diagnosis not present

## 2023-07-20 DIAGNOSIS — E785 Hyperlipidemia, unspecified: Secondary | ICD-10-CM | POA: Diagnosis present

## 2023-07-20 DIAGNOSIS — Z7982 Long term (current) use of aspirin: Secondary | ICD-10-CM | POA: Diagnosis not present

## 2023-07-20 DIAGNOSIS — F32A Depression, unspecified: Secondary | ICD-10-CM | POA: Diagnosis present

## 2023-07-20 DIAGNOSIS — I10 Essential (primary) hypertension: Secondary | ICD-10-CM | POA: Diagnosis not present

## 2023-07-20 DIAGNOSIS — Z95818 Presence of other cardiac implants and grafts: Secondary | ICD-10-CM

## 2023-07-20 DIAGNOSIS — S72012A Unspecified intracapsular fracture of left femur, initial encounter for closed fracture: Secondary | ICD-10-CM | POA: Diagnosis not present

## 2023-07-20 DIAGNOSIS — K760 Fatty (change of) liver, not elsewhere classified: Secondary | ICD-10-CM | POA: Diagnosis present

## 2023-07-20 DIAGNOSIS — Z807 Family history of other malignant neoplasms of lymphoid, hematopoietic and related tissues: Secondary | ICD-10-CM

## 2023-07-20 DIAGNOSIS — Z79899 Other long term (current) drug therapy: Secondary | ICD-10-CM | POA: Diagnosis not present

## 2023-07-20 DIAGNOSIS — Z803 Family history of malignant neoplasm of breast: Secondary | ICD-10-CM

## 2023-07-20 DIAGNOSIS — Z90710 Acquired absence of both cervix and uterus: Secondary | ICD-10-CM

## 2023-07-20 DIAGNOSIS — S72001A Fracture of unspecified part of neck of right femur, initial encounter for closed fracture: Secondary | ICD-10-CM | POA: Diagnosis present

## 2023-07-20 LAB — BASIC METABOLIC PANEL
Anion gap: 10 (ref 5–15)
BUN: 18 mg/dL (ref 8–23)
CO2: 23 mmol/L (ref 22–32)
Calcium: 8.8 mg/dL — ABNORMAL LOW (ref 8.9–10.3)
Chloride: 104 mmol/L (ref 98–111)
Creatinine, Ser: 0.82 mg/dL (ref 0.44–1.00)
GFR, Estimated: >60 mL/min (ref >60)
Glucose, Bld: 137 mg/dL — ABNORMAL HIGH (ref 70–99)
Potassium: 3.7 mmol/L (ref 3.5–5.1)
Sodium: 137 mmol/L (ref 135–145)

## 2023-07-20 LAB — CBC
HCT: 34.2 % — ABNORMAL LOW (ref 36.0–46.0)
Hemoglobin: 10.8 g/dL — ABNORMAL LOW (ref 12.0–15.0)
MCH: 30.1 pg (ref 26.0–34.0)
MCHC: 31.6 g/dL (ref 30.0–36.0)
MCV: 95.3 fL (ref 80.0–100.0)
Platelets: 248 10*3/uL (ref 150–400)
RBC: 3.59 MIL/uL — ABNORMAL LOW (ref 3.87–5.11)
RDW: 13.4 % (ref 11.5–15.5)
WBC: 6.5 10*3/uL (ref 4.0–10.5)
nRBC: 0 % (ref 0.0–0.2)

## 2023-07-20 LAB — PROTIME-INR
INR: 1.1 (ref 0.8–1.2)
Prothrombin Time: 14.8 s (ref 11.4–15.2)

## 2023-07-20 LAB — URINALYSIS, ROUTINE W REFLEX MICROSCOPIC
Bilirubin Urine: NEGATIVE
Glucose, UA: NEGATIVE mg/dL
Hgb urine dipstick: NEGATIVE
Ketones, ur: NEGATIVE mg/dL
Leukocytes,Ua: NEGATIVE
Nitrite: NEGATIVE
Protein, ur: NEGATIVE mg/dL
Specific Gravity, Urine: 1.004 — ABNORMAL LOW (ref 1.005–1.030)
pH: 7 (ref 5.0–8.0)

## 2023-07-20 LAB — TYPE AND SCREEN
ABO/RH(D): B POS
Antibody Screen: NEGATIVE

## 2023-07-20 LAB — TROPONIN I (HIGH SENSITIVITY): Troponin I (High Sensitivity): 9 ng/L (ref <18)

## 2023-07-20 MED ORDER — CELECOXIB 200 MG PO CAPS
200.0000 mg | ORAL_CAPSULE | Freq: Two times a day (BID) | ORAL | Status: AC
  Administered 2023-07-20: 200 mg via ORAL
  Filled 2023-07-20 (×2): qty 1

## 2023-07-20 MED ORDER — MAGNESIUM HYDROXIDE 400 MG/5ML PO SUSP: 30.0000 mL | Freq: Every day | ORAL | Status: DC | PRN

## 2023-07-20 MED ORDER — ONDANSETRON HCL 4 MG/2ML IJ SOLN: 4.0000 mg | INTRAMUSCULAR | Status: DC | PRN

## 2023-07-20 MED ORDER — TRANEXAMIC ACID-NACL 1000-0.7 MG/100ML-% IV SOLN
1000.0000 mg | INTRAVENOUS | Status: AC
  Administered 2023-07-21: 1000 mg via INTRAVENOUS

## 2023-07-20 MED ORDER — MORPHINE SULFATE (PF) 2 MG/ML IV SOLN: 1.0000 mg | INTRAVENOUS | Status: DC | PRN

## 2023-07-20 MED ORDER — OYSTER SHELL CALCIUM/D3 500-5 MG-MCG PO TABS
1.0000 | ORAL_TABLET | Freq: Every day | ORAL | Status: DC
  Administered 2023-07-21 – 2023-07-30 (×10): 1 via ORAL
  Filled 2023-07-20 (×11): qty 1

## 2023-07-20 MED ORDER — TRAZODONE HCL 50 MG PO TABS
25.0000 mg | ORAL_TABLET | Freq: Every evening | ORAL | Status: DC | PRN
  Filled 2023-07-20: qty 1

## 2023-07-20 MED ORDER — PANTOPRAZOLE SODIUM 40 MG PO TBEC
40.0000 mg | DELAYED_RELEASE_TABLET | Freq: Every day | ORAL | Status: DC
  Administered 2023-07-21 – 2023-07-30 (×10): 40 mg via ORAL
  Filled 2023-07-20 (×10): qty 1

## 2023-07-20 MED ORDER — HYDROCODONE-ACETAMINOPHEN 5-325 MG PO TABS: 1.0000 | ORAL_TABLET | ORAL | Status: DC | PRN

## 2023-07-20 MED ORDER — POLYVINYL ALCOHOL 1.4 % OP SOLN: 1.0000 [drp] | Freq: Four times a day (QID) | OPHTHALMIC | Status: DC | PRN

## 2023-07-20 MED ORDER — MORPHINE SULFATE (PF) 2 MG/ML IV SOLN
0.5000 mg | INTRAVENOUS | Status: DC | PRN
  Administered 2023-07-20: 0.5 mg via INTRAVENOUS
  Filled 2023-07-20: qty 1

## 2023-07-20 MED ORDER — TIMOLOL HEMIHYDRATE 0.5 % OP SOLN
1.0000 [drp] | Freq: Two times a day (BID) | OPHTHALMIC | Status: DC
  Administered 2023-07-22: 1 [drp] via OPHTHALMIC
  Filled 2023-07-20: qty 0.1

## 2023-07-20 MED ORDER — OXYCODONE-ACETAMINOPHEN 5-325 MG PO TABS
1.0000 | ORAL_TABLET | ORAL | Status: DC | PRN
  Administered 2023-07-20 – 2023-07-21 (×2): 1 via ORAL
  Filled 2023-07-20 (×2): qty 1

## 2023-07-20 MED ORDER — SODIUM CHLORIDE 0.9 % IV SOLN: INTRAVENOUS | Status: DC

## 2023-07-20 MED ORDER — LEVOTHYROXINE SODIUM 50 MCG PO TABS
100.0000 ug | ORAL_TABLET | Freq: Every day | ORAL | Status: DC
  Administered 2023-07-22 – 2023-07-30 (×9): 100 ug via ORAL
  Filled 2023-07-20 (×9): qty 2

## 2023-07-20 MED ORDER — ALPRAZOLAM 0.5 MG PO TABS
0.5000 mg | ORAL_TABLET | Freq: Every evening | ORAL | Status: DC | PRN
  Administered 2023-07-20 – 2023-07-21 (×3): 0.5 mg via ORAL
  Filled 2023-07-20 (×2): qty 1

## 2023-07-20 MED ORDER — METOPROLOL TARTRATE 25 MG PO TABS: 12.5000 mg | ORAL_TABLET | ORAL | Status: DC | PRN

## 2023-07-20 MED ORDER — POLYSACCHARIDE IRON COMPLEX 150 MG PO CAPS
150.0000 mg | ORAL_CAPSULE | Freq: Every day | ORAL | Status: DC
  Administered 2023-07-21 – 2023-07-24 (×4): 150 mg via ORAL
  Filled 2023-07-20 (×4): qty 1

## 2023-07-20 MED ORDER — GABAPENTIN 100 MG PO CAPS
200.0000 mg | ORAL_CAPSULE | Freq: Two times a day (BID) | ORAL | Status: DC
  Administered 2023-07-20: 200 mg via ORAL
  Filled 2023-07-20: qty 2

## 2023-07-20 MED ORDER — ADULT MULTIVITAMIN W/MINERALS CH
1.0000 | ORAL_TABLET | Freq: Every day | ORAL | Status: DC
  Administered 2023-07-21 – 2023-07-30 (×10): 1 via ORAL
  Filled 2023-07-20 (×11): qty 1

## 2023-07-20 MED ORDER — VITAMIN D3 25 MCG (1000 UNIT) PO TABS
5000.0000 [IU] | ORAL_TABLET | Freq: Every day | ORAL | Status: DC
  Administered 2023-07-21 – 2023-07-30 (×10): 5000 [IU] via ORAL
  Filled 2023-07-20 (×19): qty 5

## 2023-07-20 MED ORDER — LOSARTAN POTASSIUM 50 MG PO TABS
75.0000 mg | ORAL_TABLET | Freq: Every day | ORAL | Status: DC
  Administered 2023-07-21 – 2023-07-22 (×2): 75 mg via ORAL
  Filled 2023-07-20 (×2): qty 1

## 2023-07-20 MED ORDER — CEFAZOLIN SODIUM-DEXTROSE 2-4 GM/100ML-% IV SOLN
2.0000 g | INTRAVENOUS | Status: AC
Start: 2023-07-21 — End: 2023-07-21
  Administered 2023-07-21: 2 g via INTRAVENOUS

## 2023-07-20 MED ORDER — BISACODYL 5 MG PO TBEC: 5.0000 mg | DELAYED_RELEASE_TABLET | Freq: Every day | ORAL | Status: DC | PRN

## 2023-07-20 MED ORDER — POTASSIUM CHLORIDE 20 MEQ PO PACK
40.0000 meq | PACK | Freq: Every day | ORAL | Status: DC
  Administered 2023-07-21: 40 meq via ORAL
  Filled 2023-07-20 (×2): qty 2

## 2023-07-20 MED ORDER — SERTRALINE HCL 50 MG PO TABS: 100.0000 mg | ORAL_TABLET | Freq: Every day | ORAL | Status: DC

## 2023-07-20 MED ORDER — MECLIZINE HCL 25 MG PO TABS: 25.0000 mg | ORAL_TABLET | Freq: Three times a day (TID) | ORAL | Status: DC | PRN

## 2023-07-20 MED ORDER — LATANOPROST 0.005 % OP SOLN
1.0000 [drp] | Freq: Every day | OPHTHALMIC | Status: DC
Start: 2023-07-20 — End: 2023-07-21

## 2023-07-20 MED ORDER — ATORVASTATIN CALCIUM 20 MG PO TABS
40.0000 mg | ORAL_TABLET | Freq: Every day | ORAL | Status: DC
  Administered 2023-07-21 – 2023-07-30 (×10): 40 mg via ORAL
  Filled 2023-07-20 (×10): qty 2

## 2023-07-20 MED ORDER — AMIODARONE HCL 200 MG PO TABS
100.0000 mg | ORAL_TABLET | Freq: Every day | ORAL | Status: DC
  Administered 2023-07-21 – 2023-07-30 (×10): 100 mg via ORAL
  Filled 2023-07-20 (×10): qty 1

## 2023-07-20 NOTE — Assessment & Plan Note (Signed)
-   We will continue amiodarone and hold off enteric-coated aspirin.

## 2023-07-20 NOTE — H&P (Addendum)
 [place]   PATIENT NAME: Sandra [name]    MR#:  [id number]  DATE OF BIRTH:  [birth year]  DATE OF ADMISSION:  07/20/2023  PRIMARY CARE PHYSICIAN: [name], Danton Clap, MD   Patient is coming from: Home  REQUESTING/REFERRING PHYSICIAN: Minna Antis, MD  CHIEF COMPLAINT:   Chief Complaint  Patient presents with   Fall    HISTORY OF PRESENT ILLNESS:  Sandra [name] is a 78 y.o. Caucasian female with medical history significant for HFpEF, paroxysmal atrial fibrillation (used to be on Eliquis and having after having ICH it was stopped and is currently having watchman's device and loop recorder), GERD, anxiety, osteoarthritis, depression, essential hypertension, hypothyroidism, nonobstructive coronary artery disease and prediabetes, RLS and CVA with residual problems with balance, who presented to the emergency room with acute onset of fall at a restaurant when she was trying to get up and lost her balance falling on the left side.  She denied any presyncope or syncope.  No headache or dizziness or blurred vision.  No new paresthesias or focal muscle weakness.  No chest pain or palpitations.  No recent cough or wheezing or dyspnea.  No nausea or vomiting or abdominal pain.  No dysuria, oliguria or hematuria or flank pain.  No fever or chills.  ED Course: Upon presentation to the emergency room, BP was 155/70 and later 163/72 with otherwise normal vital signs.  Labs revealed unremarkable BMP and CBC showed anemia with hemoglobin 10.8 and hematocrit 34.2 compared to 11.8 and 37.5 on 06/06/2023.  High sensitive troponin I was 9.  PT and INR were normal.  Blood group is B+ with negative antibody screen.  UA showed low specific gravity 1004. EKG as reviewed by me : EKG showed normal sinus rhythm with rate of 64 with minimal voltage criteria for LVH and Q waves septally with with inferolateral ST segment depression with T wave inversion. Imaging: Portable chest x-ray showed no acute  cardiopulmonary disease.  Left elbow x-ray was negative.  Hip x-ray revealed moderately displaced proximal left femoral neck fracture.  The patient was given 200 mg of p.o. Celebrex and IV morphine sulfate.  She will be admitted to a medical-surgical bed for further evaluation and management. PAST MEDICAL HISTORY:   Past Medical History:  Diagnosis Date   (HFpEF) heart failure with preserved ejection fraction (HCC)    a. 08/2019 Echo: EF 60-65%, no rwma, mild LVH, Gr2 DD, nl RV fxn. Nl pASP. Mildly dil LA. Mild to mod MR.   Anemia    Anxiety    Arthritis    BRCA gene mutation negative 10/2016   NEGATIVE: [facility]   Breast cancer (HCC) 10/16/2016   T1c,N0;, GRADE I/III, 1.6cm. ER/PR pos  HER2 not over expressed, Right Upper Outer   Celiac disease    Celiac syndrome    Cerebellar hemorrhage, acute (HCC) 07/17/2020   Depression    Dyspnea    WITH EXERTION   Fatty liver    GERD (gastroesophageal reflux disease)    OCC   Glaucoma    right eye   Headache    H/O MIGRAINES AS TEENAGER.   Heart murmur    a. 08/2019 Echo: mild to mod MR.   Hypertension    Hypothyroidism    Non-obstructive CAD (coronary artery disease)    a. 01/2017 MV: Hypertensive response. No ischemia/infarct. EF >65%; b. 09/2019 Cor CTA: Ca2+ = 9.29 (36th percentile). LAD calcified plaque (0-24%), otw nl. Multipel bilat pulm nodules up to  7mm.   Osteoporosis    PAF (paroxysmal atrial fibrillation) (HCC)    a. 02/2020 Zio: predominantly RSR, 65 (50-105), rare PACs/PVCs, 8 beats NSVT, multiple episodes of PAF lasting up to 1hr . Avg AF rate 130 (93-170). AF burden <1%. Triggered events = RSR, PACs, and PAF; b. CHA2DS2VASc = 6.   Pneumonia    YEARS AGO   Pre-diabetes    Presence of Watchman left atrial appendage closure device 01/2021   Pulmonary nodules    a. 09/2019 Cor CTA: incidental finding of multiple bilat pulm nodules; b. 12/2019 High Res CT: stable, scattered solid pulm nodules.   Raynaud's disease    RLS  (restless legs syndrome)    Stroke (HCC) 06/2020   mild balance issues   Wears hearing aid in both ears     PAST SURGICAL HISTORY:   Past Surgical History:  Procedure Laterality Date   ABDOMINAL HYSTERECTOMY     APPENDECTOMY     AXILLARY LYMPH NODE BIOPSY Right 03/05/2017   Procedure: AXILLARY LYMPH NODE BIOPSY;  Surgeon: Earline Mayotte, MD;  Location: [facility] ORS;  Service: General;  Laterality: Right;   CATARACT EXTRACTION Bilateral    CESAREAN SECTION     COLONOSCOPY WITH PROPOFOL N/A 05/19/2015   Procedure: COLONOSCOPY WITH PROPOFOL;  Surgeon: Scot Jun, MD;  Location: [facility] ENDOSCOPY;  Service: Endoscopy;  Laterality: N/A;   COLONOSCOPY WITH PROPOFOL N/A 08/01/2019   Procedure: COLONOSCOPY WITH PROPOFOL;  Surgeon: Midge Minium, MD;  Location: [facility] ENDOSCOPY;  Service: Endoscopy;  Laterality: N/A;   EYE SURGERY     HAND SURGERY Left    LEFT ATRIAL APPENDAGE OCCLUSION N/A 01/27/2021   Procedure: LEFT ATRIAL APPENDAGE OCCLUSION;  Surgeon: Lanier Prude, MD;  Location: [facility] INVASIVE CV LAB;  Service: Cardiovascular;  Laterality: N/A;   MASTECTOMY Right 11/07/2016   RESIDUAL INVASIVE MAMMARY CARCINOMA, SUBAREOLAR ANTERIOR TO PREVIOUS    OPEN REDUCTION INTERNAL FIXATION (ORIF) DISTAL RADIAL FRACTURE Right 04/11/2022   Procedure: OPEN REDUCTION INTERNAL FIXATION (ORIF) DISTAL RADIUS FRACTURE;  Surgeon: Kennedy Bucker, MD;  Location: [facility];  Service: Orthopedics;  Laterality: Right;   OPEN REDUCTION INTERNAL FIXATION (ORIF) DISTAL RADIAL FRACTURE Left 08/04/2022   Procedure: OPEN REDUCTION INTERNAL FIXATION (ORIF) DISTAL RADIUS FRACTURE;  Surgeon: Kennedy Bucker, MD;  Location: [facility] SURGERY [facility]TR;  Service: Orthopedics;  Laterality: Left;   SENTINEL NODE BIOPSY Right 11/07/2016   Procedure: SENTINEL NODE BIOPSY;  Surgeon: Earline Mayotte, MD;  Location: [facility] ORS;  Service: General;  Laterality: Right;   SIMPLE MASTECTOMY WITH AXILLARY SENTINEL NODE BIOPSY Right  11/07/2016   6 mm ER/PR 100%; Her 2 neu not overexpressed, T1b, N0.  Surgeon: Earline Mayotte, MD;  Location: [facility] ORS;  Service: General;  Laterality: Right;   TEE WITHOUT CARDIOVERSION N/A 01/27/2021   Procedure: TRANSESOPHAGEAL ECHOCARDIOGRAM (TEE);  Surgeon: Lanier Prude, MD;  Location: [facility] INVASIVE CV LAB;  Service: Cardiovascular;  Laterality: N/A;   TEE WITHOUT CARDIOVERSION N/A 03/10/2021   Procedure: TRANSESOPHAGEAL ECHOCARDIOGRAM (TEE);  Surgeon: Parke Poisson, MD;  Location: [facility] ENDOSCOPY;  Service: Cardiology;  Laterality: N/A;   TONSILLECTOMY  AGE 15   UPPER GI ENDOSCOPY  01/12/06   hiatus hernia    SOCIAL HISTORY:   Social History   Tobacco Use   Smoking status: Never   Smokeless tobacco: Never  Substance Use Topics   Alcohol use: Not Currently    FAMILY HISTORY:   Family History  Problem Relation Age of Onset  Cancer Mother    Anemia Mother    Other Mother        lymphosarcoma   Alcohol abuse Father    Prostate cancer Father    Breast cancer Sister        32; 1/2 sister, shared mother.   Cancer Sister        bile duct   Stroke Maternal Grandmother    Breast cancer Maternal Grandmother 68   Heart attack Maternal Grandfather    CAD Paternal Grandfather     DRUG ALLERGIES:  No Known Allergies  REVIEW OF SYSTEMS:   ROS As per history of present illness. All pertinent systems were reviewed above. Constitutional, HEENT, cardiovascular, respiratory, GI, GU, musculoskeletal, neuro, psychiatric, endocrine, integumentary and hematologic systems were reviewed and are otherwise negative/unremarkable except for positive findings mentioned above in the HPI.   MEDICATIONS AT HOME:   Prior to Admission medications   Medication Sig Start Date End Date Taking? Authorizing Provider  ALPRAZolam Prudy Feeler) 0.5 MG tablet Take 1 tablet (0.5 mg total) by mouth at bedtime as needed. for sleep 03/24/22   Malva Limes, MD  amiodarone (PACERONE) 200 MG tablet Take  0.5 tablets (100 mg total) by mouth daily. 07/11/23   Lanier Prude, MD  aspirin EC 81 MG tablet Take 1 tablet (81 mg total) by mouth daily. Swallow whole. 07/27/21   Lanier Prude, MD  atorvastatin (LIPITOR) 40 MG tablet Take 1 tablet (40 mg total) by mouth daily. 07/11/23   End, Cristal Deer, MD  bisacodyl 5 MG EC tablet Take 5 mg by mouth daily as needed for moderate constipation.    [provider]  Calcium Carb-Cholecalciferol (CALCIUM 600 + D PO) Take 1 tablet by mouth daily.    [provider]  Cholecalciferol (VITAMIN D3) 125 MCG (5000 UT) TABS Take 5,000 Units by mouth daily.    [provider]  docusate sodium (COLACE) 100 MG capsule Take 100 mg by mouth daily as needed for mild constipation.    [provider]  hydroxypropyl methylcellulose / hypromellose (ISOPTO TEARS / GONIOVISC) 2.5 % ophthalmic solution Place 1 drop into both eyes 4 (four) times daily as needed for dry eyes.    [provider]  iron polysaccharides (NIFEREX) 150 MG capsule Take 1 capsule (150 mg total) by mouth daily. Or can take any over-the-counter iron supplement. 04/09/22   Darlin Priestly, MD  latanoprost (XALATAN) 0.005 % ophthalmic solution Place 1 drop into both eyes at bedtime.    [provider]  levothyroxine (SYNTHROID) 100 MCG tablet TAKE 1 TABLET BY MOUTH 30  MINUTES BEFORE BREAKFAST Patient taking differently: Take by mouth. TAKE 1 TABLET BY MOUTH 30  MINUTES BEFORE BREAKFAST 02/02/22   Bosie Clos, MD  losartan (COZAAR) 50 MG tablet Take 1.5 tablets (75 mg total) by mouth daily. 07/11/23 10/09/23  End, Cristal Deer, MD  meclizine (ANTIVERT) 25 MG tablet Take 1 tablet (25 mg total) by mouth 3 (three) times daily as needed for dizziness. 10/11/22   Delton Prairie, MD  metoprolol tartrate (LOPRESSOR) 25 MG tablet Take 0.5 tablets (12.5 mg total) by mouth as needed (Afib episodes with HR > 120bpm.). 06/01/23 08/30/23  Sherie Don, NP  Multiple Vitamin  (MULTIVITAMIN WITH MINERALS) TABS tablet Take 1 tablet by mouth daily at 3 pm.    [provider]  omeprazole (PRILOSEC) 40 MG capsule TAKE 1 CAPSULE BY MOUTH DAILY 02/02/22   Bosie Clos, MD  potassium chloride (KLOR-CON) 20 MEQ  packet DISSOLVE 2 PACKETS IN 4 OZ WATER OR JUICE AND DRINK DAILY 07/11/23   End, Cristal Deer, MD  sertraline (ZOLOFT) 100 MG tablet Take 100 mg by mouth daily.    [provider]  timolol (BETIMOL) 0.5 % ophthalmic solution Place 1 drop into both eyes 2 (two) times daily.    [provider]      VITAL SIGNS:  Blood pressure (!) 155/74, pulse 64, temperature 97.8 F (36.6 C), temperature source Oral, resp. rate 16, height 5\' 3"  (1.6 m), weight 61.1 kg, SpO2 98%.  PHYSICAL EXAMINATION:  Physical Exam  GENERAL:  78 y.o.-year-old patient lying in the bed with no acute distress.  EYES: Pupils equal, round, reactive to light and accommodation. No scleral icterus. Extraocular muscles intact.  HEENT: Head atraumatic, normocephalic. Oropharynx and nasopharynx clear.  NECK:  Supple, no jugular venous distention. No thyroid enlargement, no tenderness.  LUNGS: Normal breath sounds bilaterally, no wheezing, rales,rhonchi or crepitation. No use of accessory muscles of respiration.  CARDIOVASCULAR: Regular rate and rhythm, S1, S2 normal. No murmurs, rubs, or gallops.  ABDOMEN: Soft, nondistended, nontender. Bowel sounds present. No organomegaly or mass.  EXTREMITIES: No pedal edema, cyanosis, or clubbing.  NEUROLOGIC: Cranial nerves II through XII are intact. Muscle strength 5/5 in all extremities. Sensation intact. Gait not checked.  PSYCHIATRIC: The patient is alert and oriented x 3.  Normal affect and good eye contact. SKIN: No obvious rash, lesion, or ulcer.  Muscle skeletal: Left lateral hip tenderness. LABORATORY PANEL:   CBC Recent Labs  Lab 07/20/23 1802  WBC 6.5  HGB 10.8*  HCT 34.2*  PLT 248    ------------------------------------------------------------------------------------------------------------------  Chemistries  Recent Labs  Lab 07/20/23 1802  NA 137  K 3.7  CL 104  CO2 23  GLUCOSE 137*  BUN 18  CREATININE 0.82  CALCIUM 8.8*   ------------------------------------------------------------------------------------------------------------------  Cardiac Enzymes No results for input(s): "TROPONINI" in the last 168 hours. ------------------------------------------------------------------------------------------------------------------  RADIOLOGY:  DG Chest 1 View Result Date: 07/20/2023 CLINICAL DATA:  Fall. EXAM: CHEST  1 VIEW COMPARISON:  Oct 10, 2022. FINDINGS: Stable cardiomediastinal silhouette. Both lungs are clear. The visualized skeletal structures are unremarkable. IMPRESSION: No active disease. Electronically Signed   By: Lupita Raider M.D.   On: 07/20/2023 18:21   DG Elbow Complete Left Result Date: 07/20/2023 CLINICAL DATA:  Left elbow pain after fall. EXAM: LEFT ELBOW - COMPLETE 3+ VIEW COMPARISON:  None Available. FINDINGS: There is no evidence of fracture, dislocation, or joint effusion. There is no evidence of arthropathy or other focal bone abnormality. Soft tissues are unremarkable. IMPRESSION: Negative. Electronically Signed   By: Lupita Raider M.D.   On: 07/20/2023 18:20   DG Hip Unilat W or Wo Pelvis 2-3 Views Left Result Date: 07/20/2023 CLINICAL DATA:  Left hip pain after fall. EXAM: DG HIP (WITH OR WITHOUT PELVIS) 2-3V LEFT COMPARISON:  None Available. FINDINGS: Moderately displaced proximal left femoral neck fracture is noted. Moderate degenerative changes seen involving the right hip. IMPRESSION: Moderately displaced proximal left femoral neck fracture. Electronically Signed   By: Lupita Raider M.D.   On: 07/20/2023 18:19      IMPRESSION AND PLAN:  Assessment and Plan: * Closed left hip fracture, initial encounter [facility]) - The  patient be admitted to a medical-surgical bed. - This is secondary to mechanical fall. - Pain management will be provided. - We will continue beta-blocker therapy and statin therapy that should provide perioperative cardiovascular protection. - Will obtain an  orthopedic consult. - Dr. Hyacinth Meeker was notified and is aware about the patient. - The patient has a history of CVA and CHF that with increase her risk for perioperative cardiovascular events per the revised cardiac risk index.  She has no current active pulmonary issues. - She will be kept n.p.o. after midnight.  Essential hypertension - We will continue her antihypertensive therapy.  Dyslipidemia - We will continue statin therapy.  Hypothyroidism - We will continue Synthroid.  Chronic diastolic CHF (congestive heart failure) (HCC) - No current exacerbation. - We will continue beta-blocker therapy and ARB therapy.  Anxiety and depression - We will continue Xanax and Zoloft.  GERD without esophagitis - We will continue PPI therapy.  Paroxysmal atrial fibrillation (HCC) - We will continue amiodarone and hold off enteric-coated aspirin.   DVT prophylaxis: SCDs. Advanced Care Planning:  Code Status: full code.  Family Communication:  The plan of care was discussed in details with the patient (and family). I answered all questions. The patient agreed to proceed with the above mentioned plan. Further management will depend upon hospital course. Disposition Plan: Back to previous home environment Consults called: Orthopedic All the records are reviewed and case discussed with ED provider.  Status is: Inpatient  At the time of the admission, it appears that the appropriate admission status for this patient is inpatient.  This is judged to be reasonable and necessary in order to provide the required intensity of service to ensure the patient's safety given the presenting symptoms, physical exam findings and initial radiographic  and laboratory data in the context of comorbid conditions.  The patient requires inpatient status due to high intensity of service, high risk of further deterioration and high frequency of surveillance required.  I certify that at the time of admission, it is my clinical judgment that the patient will require inpatient hospital care extending more than 2 midnights.                            Dispo: The patient is from: Home              Anticipated d/c is to: Home              Patient currently is not medically stable to d/c.              Difficult to place patient: No  Hannah Beat M.D on 07/20/2023 at 9:55 PM  [facility]   From 7 PM-7 AM, contact night-coverage [email or web address]  CC: Primary care physician; [name], Danton Clap, MD

## 2023-07-20 NOTE — Assessment & Plan Note (Signed)
 -  We will continue Synthroid.

## 2023-07-20 NOTE — ED Provider Notes (Signed)
[facility] Provider Note    Event Date/Time   First MD Initiated Contact with Patient 07/20/23 1737     (approximate)  History   Chief Complaint: Fall  HPI  CARESSA SCEARCE is a 78 y.o. female with a past medical history of CHF, hypertension, hyperlipidemia, hypothyroidism paroxysmal atrial fibrillation on aspirin only per patient, presents to the emergency department after a fall.  According to the patient she was out to eat at [facility] when she lost her balance falling onto her left side.  Patient states some pain around the left elbow but most of her pain is around the left hip.  Does not believe she hit her head.  No LOC, no headache.  Physical Exam   Triage Vital Signs: ED Triage Vitals [07/20/23 1706]  Encounter Vitals Group     BP (!) 157/70     Systolic BP Percentile      Diastolic BP Percentile      Pulse Rate 73     Resp 20     Temp 98.3 F (36.8 C)     Temp Source Oral     SpO2 92 %     Weight      Height      Head Circumference      Peak Flow      Pain Score 5     Pain Loc      Pain Education      Exclude from Growth Chart     Most recent vital signs: Vitals:   07/20/23 1706 07/20/23 1744  BP: (!) 157/70 (!) 163/72  Pulse: 73 64  Resp: 20 18  Temp: 98.3 F (36.8 C)   SpO2: 92% 96%    General: Awake, no distress.  CV:  Good peripheral perfusion.  Regular rate and rhythm  Resp:  Normal effort.  Equal breath sounds bilaterally.  Abd:  No distention.  Soft, nontender.  No rebound or guarding. Other:  Good range of motion bilateral upper extremities including the left elbow.  Patient does have significant tenderness to palpation of the left hip worse with any attempted movement of the hip.  Neurovascularly intact distally with a 2+ DP pulse normal sensation.   ED Results / Procedures / Treatments   EKG  EKG viewed and interpreted by myself shows a normal sinus rhythm at 64 bpm with a narrow QRS, normal axis, normal  intervals.  Patient does have fairly diffuse ST changes however not significantly changed from 06/06/2023 on the last EKG.  Patient denies any chest symptoms.  RADIOLOGY  I have reviewed and interpreted the x-ray images of the hip.  Patient appears to have a left femoral neck fracture. I do not appreciate any fracture of the elbow.  MEDICATIONS ORDERED IN ED: Medications - No data to display   IMPRESSION / MDM / ASSESSMENT AND PLAN / ED COURSE  I reviewed the triage vital signs and the nursing notes.  Patient's presentation is most consistent with acute presentation with potential threat to life or bodily function.  Patient presents to the emergency department after mechanical fall while out to eat.  Patient appears to have suffered a left hip fracture on my review the x-ray images.  Awaiting radiology review of these images.  I spoke to Dr. Hyacinth Meeker for orthopedics who states they will likely repair tomorrow in the morning.  Lab work is pending.  Patient will require admission to the hospital service once her emergency department workup is  been completed.  Patient's lab work shows a reassuring CBC, reassuring chemistry negative troponin and reassuring urinalysis.  Radiology confirms left femoral neck fracture.  Patient's elbow and chest x-ray are negative.  Patient mated to the hospital service for further workup and treatment.  Dr. Hyacinth Meeker will operate at 8 AM tomorrow.  FINAL CLINICAL IMPRESSION(S) / ED DIAGNOSES   Left hip fracture   Note:  This document was prepared using Dragon voice recognition software and may include unintentional dictation errors.   Minna Antis, MD 07/20/23 2251

## 2023-07-20 NOTE — Assessment & Plan Note (Signed)
 -  We will continue statin therapy.

## 2023-07-20 NOTE — Assessment & Plan Note (Signed)
-  We will continue her antihypertensive therapy 

## 2023-07-20 NOTE — Assessment & Plan Note (Signed)
-   No current exacerbation. - We will continue beta-blocker therapy and ARB therapy.

## 2023-07-20 NOTE — Assessment & Plan Note (Addendum)
-   The patient be admitted to a medical-surgical bed. - This is secondary to mechanical fall. - Pain management will be provided. - We will continue beta-blocker therapy and statin therapy that should provide perioperative cardiovascular protection. - Will obtain an orthopedic consult. - Dr. Hyacinth Meeker was notified and is aware about the patient. - The patient has a history of CVA and CHF that with increase her risk for perioperative cardiovascular events per the revised cardiac risk index.  She has no current active pulmonary issues. - She will be kept n.p.o. after midnight.

## 2023-07-20 NOTE — Assessment & Plan Note (Signed)
 -  We will continue PPI therapy

## 2023-07-20 NOTE — ED Triage Notes (Signed)
Pt to Ed via POV from [employer]. Pt reports was getting out of booth and tripped and feel on left side. Pt reports left hip pain and left elbow pain. Pt reports dizziness prior to fall or after fall. Pt unsure if she hit her head. Pt denies LOC. Pt reports fall was on carpet area.

## 2023-07-20 NOTE — Assessment & Plan Note (Signed)
-   We will continue Xanax and Zoloft.

## 2023-07-21 ENCOUNTER — Inpatient Hospital Stay: Payer: Medicare (Managed Care) | Admitting: Anesthesiology

## 2023-07-21 ENCOUNTER — Encounter
Admission: EM | Disposition: A | Discharge status: Home Health | Payer: Self-pay | Source: Home / Self Care | Attending: Student

## 2023-07-21 ENCOUNTER — Other Ambulatory Visit: Payer: Self-pay

## 2023-07-21 ENCOUNTER — Inpatient Hospital Stay: Payer: Medicare (Managed Care)

## 2023-07-21 HISTORY — PX: HIP ARTHROPLASTY: SHX981

## 2023-07-21 LAB — BASIC METABOLIC PANEL
Anion gap: 8 (ref 5–15)
BUN: 18 mg/dL (ref 8–23)
CO2: 24 mmol/L (ref 22–32)
Calcium: 8.4 mg/dL — ABNORMAL LOW (ref 8.9–10.3)
Chloride: 105 mmol/L (ref 98–111)
Creatinine, Ser: 0.9 mg/dL (ref 0.44–1.00)
GFR, Estimated: >60 mL/min (ref >60)
Glucose, Bld: 124 mg/dL — ABNORMAL HIGH (ref 70–99)
Potassium: 4.5 mmol/L (ref 3.5–5.1)
Sodium: 137 mmol/L (ref 135–145)

## 2023-07-21 LAB — CBC
HCT: 31.5 % — ABNORMAL LOW (ref 36.0–46.0)
Hemoglobin: 10.2 g/dL — ABNORMAL LOW (ref 12.0–15.0)
MCH: 30.3 pg (ref 26.0–34.0)
MCHC: 32.4 g/dL (ref 30.0–36.0)
MCV: 93.5 fL (ref 80.0–100.0)
Platelets: 222 10*3/uL (ref 150–400)
RBC: 3.37 MIL/uL — ABNORMAL LOW (ref 3.87–5.11)
RDW: 13.3 % (ref 11.5–15.5)
WBC: 6.6 10*3/uL (ref 4.0–10.5)
nRBC: 0 % (ref 0.0–0.2)

## 2023-07-21 LAB — SURGICAL PCR SCREEN
MRSA, PCR: NEGATIVE
Staphylococcus aureus: NEGATIVE

## 2023-07-21 SURGERY — HEMIARTHROPLASTY, HIP, DIRECT ANTERIOR APPROACH, FOR FRACTURE
Anesthesia: General | Site: Hip | Laterality: Left

## 2023-07-21 MED ORDER — METOCLOPRAMIDE HCL 5 MG/ML IJ SOLN: 5.0000 mg | Freq: Three times a day (TID) | INTRAMUSCULAR | Status: DC | PRN

## 2023-07-21 MED ORDER — TRANEXAMIC ACID-NACL 1000-0.7 MG/100ML-% IV SOLN
1000.0000 mg | Freq: Once | INTRAVENOUS | Status: AC
  Administered 2023-07-21: 1000 mg via INTRAVENOUS

## 2023-07-21 MED ORDER — TRANEXAMIC ACID-NACL 1000-0.7 MG/100ML-% IV SOLN
INTRAVENOUS | Status: AC
  Filled 2023-07-21: qty 100

## 2023-07-21 MED ORDER — ROCURONIUM BROMIDE 100 MG/10ML IV SOLN
INTRAVENOUS | Status: DC | PRN
  Administered 2023-07-21: 20 mg via INTRAVENOUS
  Administered 2023-07-21: 40 mg via INTRAVENOUS

## 2023-07-21 MED ORDER — SODIUM CHLORIDE (PF) 0.9 % IJ SOLN
INTRAMUSCULAR | Status: AC
  Filled 2023-07-21: qty 50

## 2023-07-21 MED ORDER — GABAPENTIN 300 MG PO CAPS
300.0000 mg | ORAL_CAPSULE | Freq: Three times a day (TID) | ORAL | Status: DC
  Administered 2023-07-21 – 2023-07-22 (×3): 300 mg via ORAL
  Filled 2023-07-21 (×4): qty 1

## 2023-07-21 MED ORDER — DEXAMETHASONE SODIUM PHOSPHATE 10 MG/ML IJ SOLN
INTRAMUSCULAR | Status: AC
  Filled 2023-07-21: qty 1

## 2023-07-21 MED ORDER — FLEET ENEMA RE ENEM: 1.0000 | ENEMA | Freq: Once | RECTAL | Status: DC | PRN

## 2023-07-21 MED ORDER — PROPOFOL 10 MG/ML IV BOLUS
INTRAVENOUS | Status: DC | PRN
  Administered 2023-07-21: 80 mg via INTRAVENOUS

## 2023-07-21 MED ORDER — FENTANYL CITRATE (PF) 100 MCG/2ML IJ SOLN
INTRAMUSCULAR | Status: DC | PRN
Start: 2023-07-21 — End: 2023-07-21
  Administered 2023-07-21 (×2): 50 ug via INTRAVENOUS

## 2023-07-21 MED ORDER — LACTATED RINGERS IV SOLN: INTRAVENOUS | Status: DC | PRN

## 2023-07-21 MED ORDER — ACETAMINOPHEN 10 MG/ML IV SOLN
INTRAVENOUS | Status: AC
  Filled 2023-07-21: qty 100

## 2023-07-21 MED ORDER — SODIUM CHLORIDE 0.9 % IV SOLN
Status: DC | PRN
  Administered 2023-07-21: 1004 mL

## 2023-07-21 MED ORDER — METHOCARBAMOL 1000 MG/10ML IJ SOLN: 500.0000 mg | Freq: Four times a day (QID) | INTRAMUSCULAR | Status: DC | PRN

## 2023-07-21 MED ORDER — ONDANSETRON HCL 4 MG/2ML IJ SOLN
INTRAMUSCULAR | Status: DC | PRN
  Administered 2023-07-21: 4 mg via INTRAVENOUS

## 2023-07-21 MED ORDER — ONDANSETRON HCL 4 MG/2ML IJ SOLN
INTRAMUSCULAR | Status: AC
  Filled 2023-07-21: qty 2

## 2023-07-21 MED ORDER — ACETAMINOPHEN 325 MG PO TABS: 325.0000 mg | ORAL_TABLET | Freq: Four times a day (QID) | ORAL | Status: DC | PRN

## 2023-07-21 MED ORDER — OXYCODONE HCL 5 MG PO TABS: 5.0000 mg | ORAL_TABLET | Freq: Once | ORAL | Status: DC | PRN

## 2023-07-21 MED ORDER — NEOMYCIN-POLYMYXIN B GU 40-200000 IR SOLN
Status: AC
  Filled 2023-07-21: qty 1

## 2023-07-21 MED ORDER — ENOXAPARIN SODIUM 30 MG/0.3ML IJ SOSY
30.0000 mg | PREFILLED_SYRINGE | INTRAMUSCULAR | Status: DC
  Administered 2023-07-22 – 2023-07-26 (×5): 30 mg via SUBCUTANEOUS
  Filled 2023-07-21 (×5): qty 0.3

## 2023-07-21 MED ORDER — ALUM & MAG HYDROXIDE-SIMETH 200-200-20 MG/5ML PO SUSP: 30.0000 mL | ORAL | Status: DC | PRN

## 2023-07-21 MED ORDER — CEFAZOLIN SODIUM-DEXTROSE 2-4 GM/100ML-% IV SOLN
INTRAVENOUS | Status: AC
  Filled 2023-07-21: qty 100

## 2023-07-21 MED ORDER — DOCUSATE SODIUM 100 MG PO CAPS
100.0000 mg | ORAL_CAPSULE | Freq: Two times a day (BID) | ORAL | Status: DC
  Administered 2023-07-21 – 2023-07-22 (×3): 100 mg via ORAL
  Filled 2023-07-21 (×3): qty 1

## 2023-07-21 MED ORDER — MORPHINE SULFATE (PF) 2 MG/ML IV SOLN: 2.0000 mg | INTRAVENOUS | Status: DC | PRN

## 2023-07-21 MED ORDER — OXYCODONE HCL 5 MG/5ML PO SOLN: 5.0000 mg | Freq: Once | ORAL | Status: DC | PRN

## 2023-07-21 MED ORDER — FENTANYL CITRATE (PF) 100 MCG/2ML IJ SOLN
INTRAMUSCULAR | Status: AC
  Filled 2023-07-21: qty 2

## 2023-07-21 MED ORDER — LIDOCAINE HCL (PF) 2 % IJ SOLN
INTRAMUSCULAR | Status: AC
  Filled 2023-07-21: qty 5

## 2023-07-21 MED ORDER — METHOCARBAMOL 500 MG PO TABS
500.0000 mg | ORAL_TABLET | Freq: Four times a day (QID) | ORAL | Status: DC | PRN
  Administered 2023-07-21 – 2023-07-23 (×2): 500 mg via ORAL
  Filled 2023-07-21 (×2): qty 1

## 2023-07-21 MED ORDER — METOCLOPRAMIDE HCL 5 MG PO TABS: 5.0000 mg | ORAL_TABLET | Freq: Three times a day (TID) | ORAL | Status: DC | PRN

## 2023-07-21 MED ORDER — PROPOFOL 10 MG/ML IV BOLUS
INTRAVENOUS | Status: AC
  Filled 2023-07-21: qty 40

## 2023-07-21 MED ORDER — BUPIVACAINE-EPINEPHRINE (PF) 0.25% -1:200000 IJ SOLN
INTRAMUSCULAR | Status: AC
  Filled 2023-07-21: qty 30

## 2023-07-21 MED ORDER — EPHEDRINE SULFATE-NACL 50-0.9 MG/10ML-% IV SOSY
PREFILLED_SYRINGE | INTRAVENOUS | Status: DC | PRN
  Administered 2023-07-21: 10 mg via INTRAVENOUS
  Administered 2023-07-21: 5 mg via INTRAVENOUS
  Administered 2023-07-21: 10 mg via INTRAVENOUS

## 2023-07-21 MED ORDER — EPHEDRINE 5 MG/ML INJ
INTRAVENOUS | Status: AC
  Filled 2023-07-21: qty 5

## 2023-07-21 MED ORDER — HYDROCODONE-ACETAMINOPHEN 5-325 MG PO TABS
1.0000 | ORAL_TABLET | ORAL | Status: DC | PRN
  Filled 2023-07-21: qty 1

## 2023-07-21 MED ORDER — ACETAMINOPHEN 325 MG PO TABS
650.0000 mg | ORAL_TABLET | Freq: Four times a day (QID) | ORAL | Status: DC | PRN
  Administered 2023-07-25 – 2023-07-30 (×7): 650 mg via ORAL
  Filled 2023-07-21 (×8): qty 2

## 2023-07-21 MED ORDER — FENTANYL CITRATE (PF) 100 MCG/2ML IJ SOLN: 25.0000 ug | INTRAMUSCULAR | Status: DC | PRN

## 2023-07-21 MED ORDER — ACETAMINOPHEN 10 MG/ML IV SOLN
INTRAVENOUS | Status: DC | PRN
  Administered 2023-07-21: 1000 mg via INTRAVENOUS

## 2023-07-21 MED ORDER — SUGAMMADEX SODIUM 200 MG/2ML IV SOLN
INTRAVENOUS | Status: AC
  Filled 2023-07-21: qty 2

## 2023-07-21 MED ORDER — SODIUM CHLORIDE 0.45 % IV SOLN
INTRAVENOUS | Status: DC
Start: 2023-07-21 — End: 2023-07-21

## 2023-07-21 MED ORDER — CEFAZOLIN SODIUM-DEXTROSE 2-4 GM/100ML-% IV SOLN
2.0000 g | Freq: Three times a day (TID) | INTRAVENOUS | Status: AC
  Administered 2023-07-21 – 2023-07-22 (×3): 2 g via INTRAVENOUS
  Filled 2023-07-21 (×3): qty 100

## 2023-07-21 MED ORDER — ROCURONIUM BROMIDE 10 MG/ML (PF) SYRINGE
PREFILLED_SYRINGE | INTRAVENOUS | Status: AC
  Filled 2023-07-21: qty 10

## 2023-07-21 MED ORDER — DEXAMETHASONE SODIUM PHOSPHATE 10 MG/ML IJ SOLN
INTRAMUSCULAR | Status: DC | PRN
  Administered 2023-07-21: 10 mg via INTRAVENOUS

## 2023-07-21 MED ORDER — BUPIVACAINE-EPINEPHRINE 0.25% -1:200000 IJ SOLN
INTRAMUSCULAR | Status: DC | PRN
  Administered 2023-07-21: 30 mL

## 2023-07-21 MED ORDER — MENTHOL 3 MG MT LOZG: 1.0000 | LOZENGE | OROMUCOSAL | Status: DC | PRN

## 2023-07-21 MED ORDER — LIDOCAINE HCL (CARDIAC) PF 100 MG/5ML IV SOSY
PREFILLED_SYRINGE | INTRAVENOUS | Status: DC | PRN
  Administered 2023-07-21: 60 mg via INTRAVENOUS

## 2023-07-21 MED ORDER — PHENOL 1.4 % MT LIQD: 1.0000 | OROMUCOSAL | Status: DC | PRN

## 2023-07-21 MED ORDER — BISACODYL 10 MG RE SUPP
10.0000 mg | Freq: Every day | RECTAL | Status: DC | PRN
  Administered 2023-07-24: 10 mg via RECTAL
  Filled 2023-07-21: qty 1

## 2023-07-21 MED ORDER — SUGAMMADEX SODIUM 200 MG/2ML IV SOLN
INTRAVENOUS | Status: DC | PRN
  Administered 2023-07-21: 400 mg via INTRAVENOUS

## 2023-07-21 MED ORDER — SODIUM CHLORIDE 0.9 % IR SOLN
Status: DC | PRN
  Administered 2023-07-21: 1004 mL

## 2023-07-21 MED ORDER — OXYCODONE HCL 5 MG PO TABS
5.0000 mg | ORAL_TABLET | ORAL | Status: DC | PRN
Start: 2023-07-21 — End: 2023-07-22
  Administered 2023-07-21: 5 mg via ORAL
  Administered 2023-07-21 – 2023-07-22 (×2): 10 mg via ORAL
  Filled 2023-07-21: qty 2
  Filled 2023-07-21: qty 1
  Filled 2023-07-21: qty 2

## 2023-07-21 MED ORDER — ONDANSETRON HCL 4 MG PO TABS: 4.0000 mg | ORAL_TABLET | Freq: Four times a day (QID) | ORAL | Status: DC | PRN

## 2023-07-21 MED ORDER — SIMETHICONE 80 MG PO CHEW
160.0000 mg | CHEWABLE_TABLET | Freq: Once | ORAL | Status: AC
  Administered 2023-07-21: 160 mg via ORAL
  Filled 2023-07-21: qty 2

## 2023-07-21 MED ORDER — MORPHINE SULFATE (PF) 2 MG/ML IV SOLN: 0.5000 mg | INTRAVENOUS | Status: DC | PRN

## 2023-07-21 SURGICAL SUPPLY — 51 items
BLADE SAGITTAL WIDE XTHICK NO (BLADE) ×1 IMPLANT
CHLORAPREP W/TINT 26 (MISCELLANEOUS) ×2 IMPLANT
DRAPE INCISE IOBAN 66X60 STRL (DRAPES) ×2 IMPLANT
DRAPE SHEET LG 3/4 BI-LAMINATE (DRAPES) IMPLANT
DRAPE TABLE BACK 80X90 (DRAPES) ×1 IMPLANT
ELECT BLADE 6.5 EXT (BLADE) IMPLANT
ELECT CAUTERY BLADE 6.4 (BLADE) ×1 IMPLANT
ELECT REM PT RETURN 9FT ADLT (ELECTROSURGICAL) ×1 IMPLANT
ELECTRODE REM PT RTRN 9FT ADLT (ELECTROSURGICAL) ×1 IMPLANT
EVACUATOR 1/8 PVC DRAIN (DRAIN) ×1 IMPLANT
GAUZE 4X4 16PLY ~~LOC~~+RFID DBL (SPONGE) ×1 IMPLANT
GAUZE SPONGE 4X4 12PLY STRL (GAUZE/BANDAGES/DRESSINGS) ×2 IMPLANT
GAUZE XEROFORM 1X8 LF (GAUZE/BANDAGES/DRESSINGS) ×2 IMPLANT
GLOVE INDICATOR 8.0 STRL GRN (GLOVE) ×1 IMPLANT
GLOVE SURG ORTHO 8.5 STRL (GLOVE) ×1 IMPLANT
GOWN STRL REUS W/ TWL LRG LVL3 (GOWN DISPOSABLE) ×2 IMPLANT
GOWN STRL REUS W/TWL LRG LVL4 (GOWN DISPOSABLE) ×1 IMPLANT
HEAD UNPLR 45XMDLR STRL HIP (Orthopedic Implant) IMPLANT
HOLSTER ELECTROSUGICAL PENCIL (MISCELLANEOUS) ×1 IMPLANT
IV NS 1000ML BAXH (IV SOLUTION) ×1 IMPLANT
KIT TURNOVER KIT A (KITS) ×1 IMPLANT
MANIFOLD NEPTUNE II (INSTRUMENTS) ×1 IMPLANT
NDL FILTER BLUNT 18X1 1/2 (NEEDLE) ×1 IMPLANT
NDL SPNL 18GX3.5 QUINCKE PK (NEEDLE) ×2 IMPLANT
NEEDLE FILTER BLUNT 18X1 1/2 (NEEDLE) ×1 IMPLANT
NEEDLE SPNL 18GX3.5 QUINCKE PK (NEEDLE) ×2 IMPLANT
NS IRRIG 1000ML POUR BTL (IV SOLUTION) ×1 IMPLANT
PACK HIP PROSTHESIS (MISCELLANEOUS) ×1 IMPLANT
PAD ABD DERMACEA PRESS 5X9 (GAUZE/BANDAGES/DRESSINGS) IMPLANT
PENCIL SMOKE EVACUATOR (MISCELLANEOUS) IMPLANT
PILLOW ABDUC SM (MISCELLANEOUS) IMPLANT
PULSAVAC PLUS IRRIG FAN TIP (DISPOSABLE) ×1 IMPLANT
SLEEVE UNITRAX V40 STD (Orthopedic Implant) IMPLANT
SOL PREP PVP 2OZ (MISCELLANEOUS) ×1 IMPLANT
SOLUTION PREP PVP 2OZ (MISCELLANEOUS) ×1 IMPLANT
SPONGE T-LAP 18X18 ~~LOC~~+RFID (SPONGE) ×4 IMPLANT
STAPLER SKIN PROX 35W (STAPLE) ×1 IMPLANT
STEM FEM ACCOLADE S2 37X99X30 (Stem) IMPLANT
SUT STRATA PDS 2-0 CT-1 (SUTURE) ×1 IMPLANT
SUT STRATAFIX 14 PDO 48 VLT (SUTURE) ×1 IMPLANT
SUT STRATAFIX PDO 1 14 VIOLET (SUTURE) ×1 IMPLANT
SUT TICRON 2-0 30IN 311381 (SUTURE) ×4 IMPLANT
SYR 10ML LL (SYRINGE) ×1 IMPLANT
SYR 30ML LL (SYRINGE) ×1 IMPLANT
SYR 50ML LL SCALE MARK (SYRINGE) ×1 IMPLANT
TAPE MICROFOAM 4IN (TAPE) ×1 IMPLANT
TIP FAN IRRIG PULSAVAC PLUS (DISPOSABLE) ×1 IMPLANT
TRAP FLUID SMOKE EVACUATOR (MISCELLANEOUS) ×1 IMPLANT
TUBE SUCT KAM VAC (TUBING) ×1 IMPLANT
WATER STERILE IRR 1000ML POUR (IV SOLUTION) ×1 IMPLANT
WATER STERILE IRR 500ML POUR (IV SOLUTION) ×1 IMPLANT

## 2023-07-21 NOTE — Plan of Care (Signed)

## 2023-07-21 NOTE — Transfer of Care (Signed)
 Immediate Anesthesia Transfer of Care Note  Patient: Sandra [name]  Procedure(s) Performed: ARTHROPLASTY BIPOLAR HIP (HEMIARTHROPLASTY) (Left: Hip)  Patient Location: PACU  Anesthesia Type:General  Level of Consciousness: awake, alert , and oriented  Airway & Oxygen Therapy: Patient Spontanous Breathing and Patient connected to face mask oxygen  Post-op Assessment: Report given to RN, Post -op Vital signs reviewed and stable, and Patient moving all extremities X 4  Post vital signs: Reviewed and stable  Last Vitals:  Vitals Value Taken Time  BP 150/62 07/21/23 1012  Temp    Pulse 61 07/21/23 1015  Resp 18 07/21/23 1015  SpO2 99 % 07/21/23 1015  Vitals shown include unfiled device data.  Last Pain:  Vitals:   07/21/23 0436  TempSrc:   PainSc: 5       Patients Stated Pain Goal: 0 (07/21/23 0109)  Complications: No notable events documented.

## 2023-07-21 NOTE — Op Note (Signed)
 07/21/2023  10:09 AM  PATIENT:  Sandra [name]   MRN: [id number]  PRE-OPERATIVE DIAGNOSIS:  Displaced Subcapital fracture left hip   POST-OPERATIVE DIAGNOSIS: Same  PROCEDURE: Left   hip hemiarthroplasty with Stryker Accolade prosthesis  PREOPERATIVE INDICATIONS:  Sandra [name] is an 78 y.o. female who was admitted 07/20/2023 with a diagnosis of displaced subcapital fracture of the hip and elected for surgical management.  The risks benefits and alternatives were discussed with the patient including but not limited to the risks of nonoperative treatment, versus surgical intervention including infection, bleeding, nerve injury, periprosthetic fracture, the need for revision surgery, dislocation, leg length discrepancy, blood clots, cardiopulmonary complications, morbidity, mortality, among others, and they were willing to proceed.  Predicted outcome is good, although there will be at least a six to nine month expected recovery.     SURGEON:  Deeann Saint, MD  ASST:    ANESTHESIA: General Endotracheal    COMPLICATIONS:  None.   EBL: 100 cc    COMPONENTS:  Stryker Accolade Femoral Fracture stem size # 2   and a size 45 mm  fracture head unipolar hip ball with    standard  neck length.    PROCEDURE IN DETAIL: The patient was met in the holding area and identified.  The appropriate hip  was marked at the operative site. The patient was then transported to the OR and  placed under general anesthesia.  At that point, the patient was  placed in the lateral decubitus position with the operative side up and  secured to the operating room table and all bony prominences padded.     The operative lower extremity was prepped from the iliac crest to the toes.  Sterile draping was performed.  Time out was performed prior to incision.      A routine posterolateral approach was utilized via sharp dissection  carried down to the subcutaneous tissue.  Gross bleeders were Bovie  coagulated.  The iliotibial  band was identified and incised  along the length of the skin incision.  Self-retaining retractors were  inserted.  With the hip internally rotated, the short external rotators  were identified. The piriformis was tagged and the hip capsule released in a T-type fashion.  The femoral neck was exposed, and I resected the femoral neck using the appropriate jig. This was performed at approximately a thumb's breadth above the lesser trochanter.    I then exposed the deep acetabulum, cleared out any tissue including the ligamentum teres.    I then prepared the proximal femur using the cookie-cutter, the lateralizing reamer, and then sequentially broached.  A trial stem   was  utilized along with a unipolar head and neck.  I reduced the hip and it was found to have excellent stability with functional range of motion. Leg lengths were equal.  The trial components were then removed.   The same size Accolade femoral stem was then inserted and was very stable.  The Unitrax head and neck as trialed were inserted as well.     The hip was then reduced and taken through functional range of motion and found to have excellent stability. Leg lengths were restored.     I closed the T in the capsule with #2 Ticron as well as the short external rotators. A hemovac was inserted.    I then irrigated the hip copiously again with pulse lavage, and repaired the fascia with #2 Quill and the subcutaneous layer with #0 Quill. Sponge and  needle counts were correct. Dry sterile Aquacell was applied.   The patient was then awakened and returned to PACU in stable and satisfactory condition. There were no complications.  Valinda Hoar, MD Orthopedic Surgeon [phone]   07/21/2023 10:09 AM

## 2023-07-21 NOTE — Consult Note (Signed)
 ORTHOPAEDIC CONSULTATION  REQUESTING PHYSICIAN: Tresa Moore, MD  Chief Complaint: Left hip pain  HPI: Sandra [name] is a 78 y.o. female who complains of left hip pain following a fall at a restaurant yesterday evening.  Patient excellently tripped.  She was brought to the emergency room where exam and x-rays revealed a mildly displaced subcapital fracture of the left hip.  Patient was evaluated by the medical service and cleared for surgery.  She does have a history of cardiac problems including atrial fibrillation and some congestive heart failure in the past.  She been cleared for surgery.  Risk and benefits of surgery were discussed with the patient and her daughter at length today.  They wish to proceed with surgery.  Will plan on proceeding this morning.  Past Medical History:  Diagnosis Date   (HFpEF) heart failure with preserved ejection fraction (HCC)    a. 08/2019 Echo: EF 60-65%, no rwma, mild LVH, Gr2 DD, nl RV fxn. Nl pASP. Mildly dil LA. Mild to mod MR.   Anemia    Anxiety    Arthritis    BRCA gene mutation negative 10/2016   NEGATIVE: [facility]   Breast cancer (HCC) 10/16/2016   T1c,N0;, GRADE I/III, 1.6cm. ER/PR pos  HER2 not over expressed, Right Upper Outer   Celiac disease    Celiac syndrome    Cerebellar hemorrhage, acute (HCC) 07/17/2020   Depression    Dyspnea    WITH EXERTION   Fatty liver    GERD (gastroesophageal reflux disease)    OCC   Glaucoma    right eye   Headache    H/O MIGRAINES AS TEENAGER.   Heart murmur    a. 08/2019 Echo: mild to mod MR.   Hypertension    Hypothyroidism    Non-obstructive CAD (coronary artery disease)    a. 01/2017 MV: Hypertensive response. No ischemia/infarct. EF >65%; b. 09/2019 Cor CTA: Ca2+ = 9.29 (36th percentile). LAD calcified plaque (0-24%), otw nl. Multipel bilat pulm nodules up to 7mm.   Osteoporosis    PAF (paroxysmal atrial fibrillation) (HCC)    a. 02/2020 Zio: predominantly RSR, 65 (50-105), rare  PACs/PVCs, 8 beats NSVT, multiple episodes of PAF lasting up to 1hr . Avg AF rate 130 (93-170). AF burden <1%. Triggered events = RSR, PACs, and PAF; b. CHA2DS2VASc = 6.   Pneumonia    YEARS AGO   Pre-diabetes    Presence of Watchman left atrial appendage closure device 01/2021   Pulmonary nodules    a. 09/2019 Cor CTA: incidental finding of multiple bilat pulm nodules; b. 12/2019 High Res CT: stable, scattered solid pulm nodules.   Raynaud's disease    RLS (restless legs syndrome)    Stroke (HCC) 06/2020   mild balance issues   Wears hearing aid in both ears    Past Surgical History:  Procedure Laterality Date   ABDOMINAL HYSTERECTOMY     APPENDECTOMY     AXILLARY LYMPH NODE BIOPSY Right 03/05/2017   Procedure: AXILLARY LYMPH NODE BIOPSY;  Surgeon: Earline Mayotte, MD;  Location: [facility] ORS;  Service: General;  Laterality: Right;   CATARACT EXTRACTION Bilateral    CESAREAN SECTION     COLONOSCOPY WITH PROPOFOL N/A 05/19/2015   Procedure: COLONOSCOPY WITH PROPOFOL;  Surgeon: Scot Jun, MD;  Location: [facility] ENDOSCOPY;  Service: Endoscopy;  Laterality: N/A;   COLONOSCOPY WITH PROPOFOL N/A 08/01/2019   Procedure: COLONOSCOPY WITH PROPOFOL;  Surgeon: Midge Minium, MD;  Location: [facility] ENDOSCOPY;  Service:  Endoscopy;  Laterality: N/A;   EYE SURGERY     HAND SURGERY Left    LEFT ATRIAL APPENDAGE OCCLUSION N/A 01/27/2021   Procedure: LEFT ATRIAL APPENDAGE OCCLUSION;  Surgeon: Lanier Prude, MD;  Location: [facility] INVASIVE CV LAB;  Service: Cardiovascular;  Laterality: N/A;   MASTECTOMY Right 11/07/2016   RESIDUAL INVASIVE MAMMARY CARCINOMA, SUBAREOLAR ANTERIOR TO PREVIOUS    OPEN REDUCTION INTERNAL FIXATION (ORIF) DISTAL RADIAL FRACTURE Right 04/11/2022   Procedure: OPEN REDUCTION INTERNAL FIXATION (ORIF) DISTAL RADIUS FRACTURE;  Surgeon: Kennedy Bucker, MD;  Location: [facility];  Service: Orthopedics;  Laterality: Right;   OPEN REDUCTION INTERNAL FIXATION (ORIF) DISTAL RADIAL  FRACTURE Left 08/04/2022   Procedure: OPEN REDUCTION INTERNAL FIXATION (ORIF) DISTAL RADIUS FRACTURE;  Surgeon: Kennedy Bucker, MD;  Location: [facility];  Service: Orthopedics;  Laterality: Left;   SENTINEL NODE BIOPSY Right 11/07/2016   Procedure: SENTINEL NODE BIOPSY;  Surgeon: Earline Mayotte, MD;  Location: [facility] ORS;  Service: General;  Laterality: Right;   SIMPLE MASTECTOMY WITH AXILLARY SENTINEL NODE BIOPSY Right 11/07/2016   6 mm ER/PR 100%; Her 2 neu not overexpressed, T1b, N0.  Surgeon: Earline Mayotte, MD;  Location: [facility] ORS;  Service: General;  Laterality: Right;   TEE WITHOUT CARDIOVERSION N/A 01/27/2021   Procedure: TRANSESOPHAGEAL ECHOCARDIOGRAM (TEE);  Surgeon: Lanier Prude, MD;  Location: [facility] INVASIVE CV LAB;  Service: Cardiovascular;  Laterality: N/A;   TEE WITHOUT CARDIOVERSION N/A 03/10/2021   Procedure: TRANSESOPHAGEAL ECHOCARDIOGRAM (TEE);  Surgeon: Parke Poisson, MD;  Location: [facility] ENDOSCOPY;  Service: Cardiology;  Laterality: N/A;   TONSILLECTOMY  AGE 66   UPPER GI ENDOSCOPY  01/12/06   hiatus hernia   Social History   Socioeconomic History   Marital status: Married    Spouse name: Dimas Aguas   Number of children: 2   Years of education: Not on file   Highest education level: Some college, no degree  Occupational History   Occupation: retired  Tobacco Use   Smoking status: Never   Smokeless tobacco: Never  Vaping Use   Vaping status: Never Used  Substance and Sexual Activity   Alcohol use: Not Currently   Drug use: No   Sexual activity: Not Currently    Birth control/protection: None  Other Topics Concern   Not on file  Social History Narrative   Lives with Husband   Right handed   Drinks 2-3 cups caffiene daily   Social Drivers of Health   Financial Resource Strain: Low Risk  (05/18/2021)   Overall Financial Resource Strain (CARDIA)    Difficulty of Paying Living Expenses: Not hard at all  Food Insecurity: No Food Insecurity  (07/20/2023)   Hunger Vital Sign    Worried About Running Out of Food in the Last Year: Never true    Ran Out of Food in the Last Year: Never true  Transportation Needs: Unmet Transportation Needs (07/20/2023)   PRAPARE - Transportation    Lack of Transportation (Medical): Yes    Lack of Transportation (Non-Medical): Yes  Physical Activity: Insufficiently Active (05/18/2021)   Exercise Vital Sign    Days of Exercise per Week: 3 days    Minutes of Exercise per Session: 30 min  Stress: No Stress Concern Present (05/18/2021)   [employer] of Occupational Health - Occupational Stress Questionnaire    Feeling of Stress : Only a little  Social Connections: Socially Integrated (07/20/2023)   Social Connection and Isolation Panel [NHANES]    Frequency of Communication  with Friends and Family: More than three times a week    Frequency of Social Gatherings with Friends and Family: More than three times a week    Attends Religious Services: More than 4 times per year    Active Member of [employer] or Organizations: Yes    Attends Engineer, structural: More than 4 times per year    Marital Status: Married   Family History  Problem Relation Age of Onset   Cancer Mother    Anemia Mother    Other Mother        lymphosarcoma   Alcohol abuse Father    Prostate cancer Father    Breast cancer Sister        56; 1/2 sister, shared mother.   Cancer Sister        bile duct   Stroke Maternal Grandmother    Breast cancer Maternal Grandmother 68   Heart attack Maternal Grandfather    CAD Paternal Grandfather    No Known Allergies Prior to Admission medications   Medication Sig Start Date End Date Taking? Authorizing Provider  ALPRAZolam Prudy Feeler) 0.5 MG tablet Take 1 tablet (0.5 mg total) by mouth at bedtime as needed. for sleep 03/24/22   Malva Limes, MD  amiodarone (PACERONE) 200 MG tablet Take 0.5 tablets (100 mg total) by mouth daily. 07/11/23   Lanier Prude, MD  aspirin EC 81  MG tablet Take 1 tablet (81 mg total) by mouth daily. Swallow whole. 07/27/21   Lanier Prude, MD  atorvastatin (LIPITOR) 40 MG tablet Take 1 tablet (40 mg total) by mouth daily. 07/11/23   End, Cristal Deer, MD  bisacodyl 5 MG EC tablet Take 5 mg by mouth daily as needed for moderate constipation.    [provider]  Calcium Carb-Cholecalciferol (CALCIUM 600 + D PO) Take 1 tablet by mouth daily.    [provider]  Cholecalciferol (VITAMIN D3) 125 MCG (5000 UT) TABS Take 5,000 Units by mouth daily.    [provider]  docusate sodium (COLACE) 100 MG capsule Take 100 mg by mouth daily as needed for mild constipation.    [provider]  hydroxypropyl methylcellulose / hypromellose (ISOPTO TEARS / GONIOVISC) 2.5 % ophthalmic solution Place 1 drop into both eyes 4 (four) times daily as needed for dry eyes.    [provider]  iron polysaccharides (NIFEREX) 150 MG capsule Take 1 capsule (150 mg total) by mouth daily. Or can take any over-the-counter iron supplement. 04/09/22   Darlin Priestly, MD  latanoprost (XALATAN) 0.005 % ophthalmic solution Place 1 drop into both eyes at bedtime.    [provider]  levothyroxine (SYNTHROID) 100 MCG tablet TAKE 1 TABLET BY MOUTH 30  MINUTES BEFORE BREAKFAST Patient taking differently: Take by mouth. TAKE 1 TABLET BY MOUTH 30  MINUTES BEFORE BREAKFAST 02/02/22   Bosie Clos, MD  losartan (COZAAR) 50 MG tablet Take 1.5 tablets (75 mg total) by mouth daily. 07/11/23 10/09/23  End, Cristal Deer, MD  meclizine (ANTIVERT) 25 MG tablet Take 1 tablet (25 mg total) by mouth 3 (three) times daily as needed for dizziness. 10/11/22   Delton Prairie, MD  metoprolol tartrate (LOPRESSOR) 25 MG tablet Take 0.5 tablets (12.5 mg total) by mouth as needed (Afib episodes with HR > 120bpm.). 06/01/23 08/30/23  Sherie Don, NP  Multiple Vitamin (MULTIVITAMIN WITH MINERALS) TABS tablet Take 1 tablet by mouth daily at 3 pm.    [provider]  omeprazole (PRILOSEC)  40 MG capsule TAKE 1 CAPSULE BY MOUTH DAILY 02/02/22   Bosie Clos, MD  potassium chloride (KLOR-CON) 20 MEQ packet DISSOLVE 2 PACKETS IN 4 OZ WATER OR JUICE AND DRINK DAILY 07/11/23   End, Cristal Deer, MD  sertraline (ZOLOFT) 100 MG tablet Take 100 mg by mouth daily.    [provider]  timolol (BETIMOL) 0.5 % ophthalmic solution Place 1 drop into both eyes 2 (two) times daily.    [provider]   DG Chest 1 View Result Date: 07/20/2023 CLINICAL DATA:  Fall. EXAM: CHEST  1 VIEW COMPARISON:  Oct 10, 2022. FINDINGS: Stable cardiomediastinal silhouette. Both lungs are clear. The visualized skeletal structures are unremarkable. IMPRESSION: No active disease. Electronically Signed   By: Lupita Raider M.D.   On: 07/20/2023 18:21   DG Elbow Complete Left Result Date: 07/20/2023 CLINICAL DATA:  Left elbow pain after fall. EXAM: LEFT ELBOW - COMPLETE 3+ VIEW COMPARISON:  None Available. FINDINGS: There is no evidence of fracture, dislocation, or joint effusion. There is no evidence of arthropathy or other focal bone abnormality. Soft tissues are unremarkable. IMPRESSION: Negative. Electronically Signed   By: Lupita Raider M.D.   On: 07/20/2023 18:20   DG Hip Unilat W or Wo Pelvis 2-3 Views Left Result Date: 07/20/2023 CLINICAL DATA:  Left hip pain after fall. EXAM: DG HIP (WITH OR WITHOUT PELVIS) 2-3V LEFT COMPARISON:  None Available. FINDINGS: Moderately displaced proximal left femoral neck fracture is noted. Moderate degenerative changes seen involving the right hip. IMPRESSION: Moderately displaced proximal left femoral neck fracture. Electronically Signed   By: Lupita Raider M.D.   On: 07/20/2023 18:19    Positive ROS: All other systems have been reviewed and were otherwise negative with the exception of those mentioned in the HPI and as above.  Physical Exam: General: Alert, no acute distress Cardiovascular: No pedal  edema Respiratory: No cyanosis, no use of accessory musculature GI: No organomegaly, abdomen is soft and non-tender Skin: No lesions in the area of chief complaint Neurologic: Sensation intact distally Psychiatric: Patient is competent for consent with normal mood and affect Lymphatic: No axillary or cervical lymphadenopathy  MUSCULOSKELETAL: Patient alert and oriented and cooperative.  Left leg is internally rotated slightly but not shortened.  There is pain with movement of the hip.  The skin is intact.  No swelling.  Neurovascular status to get his knee.  Right lower extremity is normal to exam.  Both upper extremities are normal.  Spine cervical spine and thoracolumbar spine are nontender.  Assessment: Mildly displaced subcapital fracture left hip  Plan: Left hip hemiarthroplasty.  I believe there is too much displacement to warrant a pinning of this fracture.    Valinda Hoar, MD [phone]   07/21/2023 8:14 AM

## 2023-07-21 NOTE — Plan of Care (Signed)
  Problem: Education: Goal: Knowledge of General Education information will improve Description: Including pain rating scale, medication(s)/side effects and non-pharmacologic comfort measures Outcome: Progressing   Problem: Nutrition: Goal: Adequate nutrition will be maintained Outcome: Progressing   Problem: Elimination: Goal: Will not experience complications related to bowel motility Outcome: Progressing Goal: Will not experience complications related to urinary retention Outcome: Progressing   Problem: Pain Managment: Goal: General experience of comfort will improve and/or be controlled Outcome: Progressing   Problem: Safety: Goal: Ability to remain free from injury will improve Outcome: Progressing

## 2023-07-21 NOTE — Progress Notes (Signed)
 Pt states she has a loop recorder and watchman device at this time.

## 2023-07-21 NOTE — H&P (Signed)
.  hem

## 2023-07-21 NOTE — Anesthesia Postprocedure Evaluation (Signed)
 Anesthesia Post Note  Patient: Sandra [name]  Procedure(s) Performed: ARTHROPLASTY BIPOLAR HIP (HEMIARTHROPLASTY) (Left: Hip)  Patient location during evaluation: PACU Anesthesia Type: General Level of consciousness: awake and alert Pain management: pain level controlled Vital Signs Assessment: post-procedure vital signs reviewed and stable Respiratory status: spontaneous breathing, nonlabored ventilation, respiratory function stable and patient connected to nasal cannula oxygen Cardiovascular status: blood pressure returned to baseline and stable Postop Assessment: no apparent nausea or vomiting Anesthetic complications: no   No notable events documented.   Last Vitals:  Vitals:   07/21/23 1100 07/21/23 1128  BP: (!) 116/57 (!) 125/59  Pulse: 64 (!) 59  Resp: (!) 22 16  Temp:  36.4 C  SpO2: 99% 99%    Last Pain:  Vitals:   07/21/23 1128  TempSrc: Oral  PainSc:                  Cleda Mccreedy Regginald Pask

## 2023-07-21 NOTE — Progress Notes (Signed)
 PROGRESS NOTE    Sandra [name]  ZOX:[id number] DOB: Apr 08, 1946 DOA: 07/20/2023 PCP: Louis Matte, MD    Brief Narrative:  78 y.o. Caucasian female with medical history significant for HFpEF, paroxysmal atrial fibrillation (used to be on Eliquis and having after having ICH it was stopped and is currently having watchman's device and loop recorder), GERD, anxiety, osteoarthritis, depression, essential hypertension, hypothyroidism, nonobstructive coronary artery disease and prediabetes, RLS and CVA with residual problems with balance, who presented to the emergency room with acute onset of fall at a restaurant when she was trying to get up and lost her balance falling on the left side.  She denied any presyncope or syncope.  No headache or dizziness or blurred vision.  No new paresthesias or focal muscle weakness.  No chest pain or palpitations.  No recent cough or wheezing or dyspnea.  No nausea or vomiting or abdominal pain.  No dysuria, oliguria or hematuria or flank pain.  No fever or chills.     Assessment & Plan:   Principal Problem:   Closed left hip fracture, initial encounter [facility]) Active Problems:   Essential hypertension   Dyslipidemia   Hypothyroidism   Paroxysmal atrial fibrillation (HCC)   GERD without esophagitis   Anxiety and depression   Chronic diastolic CHF (congestive heart failure) (HCC)   * Closed left hip fracture, initial encounter (HCC) Mechanical fall No evidence of syncopal event Orthopedics consulted from ED Status post left hip hemiarthroplasty on 2/22 Plan: Okay for diet PT OT postoperative day 1 Chemoprophylaxis postoperative day 1 Pain control Anticipate need for skilled nursing facility   Essential hypertension Continue home regimen   Dyslipidemia Continue home regimen   Hypothyroidism PTA Synthroid   Chronic diastolic CHF (congestive heart failure) (HCC) No evidence of exacerbation Beta-blocker and ARB   Anxiety and  depression PTA Xanax and Zoloft   GERD without esophagitis PPI   Paroxysmal atrial fibrillation (HCC) Amiodarone   DVT prophylaxis: SQ lovenox (start 2/23) Code Status: Full Family Communication:Daughter at bedside 2/22 Disposition Plan: Status is: Inpatient Remains inpatient appropriate because: hip fracture POD#0   Level of care: Med-Surg  Consultants:  Orthopedics  Procedures:  Left hip hemiarthroplasty 2/22  Antimicrobials: Ancef, perioperative   Subjective: Seen and examined.  Pain mild to moderate  Objective: Vitals:   07/21/23 1030 07/21/23 1045 07/21/23 1100 07/21/23 1128  BP: (!) 126/54 (!) 131/59 (!) 116/57 (!) 125/59  Pulse: 65 63 64 (!) 59  Resp: 16 12 (!) 22 16  Temp:  98.1 F (36.7 C)  97.6 F (36.4 C)  TempSrc:    Oral  SpO2: 98% 99% 99% 99%  Weight:      Height:        Intake/Output Summary (Last 24 hours) at 07/21/2023 1315 Last data filed at 07/21/2023 1005 Gross per 24 hour  Intake 1110.99 ml  Output 500 ml  Net 610.99 ml   Filed Weights   07/20/23 2037  Weight: 61.1 kg    Examination:  General exam: Appears calm and comfortable  Respiratory system: Clear to auscultation. Respiratory effort normal. Cardiovascular system: S1S2, reg rate, irreg rhythm, no murmur Gastrointestinal system: Soft, NTND, normal BS Central nervous system: Alert and oriented. No focal neurological deficits. Extremities: left hip dressing CDI Skin: No rashes, lesions or ulcers Psychiatry: Judgement and insight appear normal. Mood & affect appropriate.     Data Reviewed: I have personally reviewed following labs and imaging studies  CBC: Recent Labs  Lab 07/20/23 1802  07/21/23 0333  WBC 6.5 6.6  HGB 10.8* 10.2*  HCT 34.2* 31.5*  MCV 95.3 93.5  PLT 248 222   Basic Metabolic Panel: Recent Labs  Lab 07/20/23 1802 07/21/23 0333  NA 137 137  K 3.7 4.5  CL 104 105  CO2 23 24  GLUCOSE 137* 124*  BUN 18 18  CREATININE 0.82 0.90  CALCIUM  8.8* 8.4*   GFR: Estimated Creatinine Clearance: 42.6 mL/min (by C-G formula based on SCr of 0.9 mg/dL). Liver Function Tests: No results for input(s): "AST", "ALT", "ALKPHOS", "BILITOT", "PROT", "ALBUMIN" in the last 168 hours. No results for input(s): "LIPASE", "AMYLASE" in the last 168 hours. No results for input(s): "AMMONIA" in the last 168 hours. Coagulation Profile: Recent Labs  Lab 07/20/23 1848  INR 1.1   Cardiac Enzymes: No results for input(s): "CKTOTAL", "CKMB", "CKMBINDEX", "TROPONINI" in the last 168 hours. BNP (last 3 results) No results for input(s): "PROBNP" in the last 8760 hours. HbA1C: No results for input(s): "HGBA1C" in the last 72 hours. CBG: No results for input(s): "GLUCAP" in the last 168 hours. Lipid Profile: No results for input(s): "CHOL", "HDL", "LDLCALC", "TRIG", "CHOLHDL", "LDLDIRECT" in the last 72 hours. Thyroid Function Tests: No results for input(s): "TSH", "T4TOTAL", "FREET4", "T3FREE", "THYROIDAB" in the last 72 hours. Anemia Panel: No results for input(s): "VITAMINB12", "FOLATE", "FERRITIN", "TIBC", "IRON", "RETICCTPCT" in the last 72 hours. Sepsis Labs: No results for input(s): "PROCALCITON", "LATICACIDVEN" in the last 168 hours.  Recent Results (from the past 240 hours)  Surgical PCR screen     Status: None   Collection Time: 07/20/23 10:05 PM   Specimen: Nasal Mucosa; Nasal Swab  Result Value Ref Range Status   MRSA, PCR NEGATIVE NEGATIVE Final   Staphylococcus aureus NEGATIVE NEGATIVE Final    Comment: (NOTE) The Xpert SA Assay (FDA approved for NASAL specimens in patients 68 years of age and older), is one component of a comprehensive surveillance program. It is not intended to diagnose infection nor to guide or monitor treatment. Performed at [facility], [address] [place], [place] [address]          Radiology Studies: DG Chest 1 View Result Date: 07/20/2023 CLINICAL DATA:  Fall. EXAM: CHEST  1 VIEW  COMPARISON:  Oct 10, 2022. FINDINGS: Stable cardiomediastinal silhouette. Both lungs are clear. The visualized skeletal structures are unremarkable. IMPRESSION: No active disease. Electronically Signed   By: Lupita Raider M.D.   On: 07/20/2023 18:21   DG Elbow Complete Left Result Date: 07/20/2023 CLINICAL DATA:  Left elbow pain after fall. EXAM: LEFT ELBOW - COMPLETE 3+ VIEW COMPARISON:  None Available. FINDINGS: There is no evidence of fracture, dislocation, or joint effusion. There is no evidence of arthropathy or other focal bone abnormality. Soft tissues are unremarkable. IMPRESSION: Negative. Electronically Signed   By: Lupita Raider M.D.   On: 07/20/2023 18:20   DG Hip Unilat W or Wo Pelvis 2-3 Views Left Result Date: 07/20/2023 CLINICAL DATA:  Left hip pain after fall. EXAM: DG HIP (WITH OR WITHOUT PELVIS) 2-3V LEFT COMPARISON:  None Available. FINDINGS: Moderately displaced proximal left femoral neck fracture is noted. Moderate degenerative changes seen involving the right hip. IMPRESSION: Moderately displaced proximal left femoral neck fracture. Electronically Signed   By: Lupita Raider M.D.   On: 07/20/2023 18:19        Scheduled Meds:  amiodarone  100 mg Oral Daily   atorvastatin  40 mg Oral Daily   calcium-vitamin  D  1 tablet Oral Daily   cholecalciferol  5,000 Units Oral Daily   docusate sodium  100 mg Oral BID   [START ON 07/22/2023] enoxaparin (LOVENOX) injection  30 mg Subcutaneous Q24H   gabapentin  200 mg Oral BID   iron polysaccharides  150 mg Oral Daily   levothyroxine  100 mcg Oral Q0600   losartan  75 mg Oral Daily   multivitamin with minerals  1 tablet Oral Q1500   pantoprazole  40 mg Oral Daily   potassium chloride  40 mEq Oral Daily   timolol  1 drop Both Eyes BID   Continuous Infusions:  sodium chloride     sodium chloride 75 mL/hr at 07/21/23 1305    ceFAZolin (ANCEF) IV       LOS: 1 day     Tresa Moore, MD [facility]   If  7PM-7AM, please contact night-coverage  07/21/2023, 1:15 PM

## 2023-07-21 NOTE — Anesthesia Procedure Notes (Signed)
 Procedure Name: Intubation Date/Time: 07/21/2023 8:20 AM  Performed by: Katherine Basset, CRNAPre-anesthesia Checklist: Patient identified, Emergency Drugs available, Suction available and Patient being monitored Patient Re-evaluated:Patient Re-evaluated prior to induction Oxygen Delivery Method: Circle system utilized Preoxygenation: Pre-oxygenation with 100% oxygen Induction Type: IV induction Ventilation: Mask ventilation without difficulty Laryngoscope Size: Miller and 2 Grade View: Grade II Tube type: Oral Tube size: 7.0 mm Number of attempts: 1 Airway Equipment and Method: Stylet, Oral airway and Bite block Placement Confirmation: ETT inserted through vocal cords under direct vision, positive ETCO2 and breath sounds checked- equal and bilateral Secured at: 20 cm Tube secured with: Tape Dental Injury: Teeth and Oropharynx as per pre-operative assessment

## 2023-07-21 NOTE — Anesthesia Preprocedure Evaluation (Addendum)
 Anesthesia Evaluation  Patient identified by MRN, date of birth, ID band Patient awake    Reviewed: Allergy & Precautions, NPO status , Patient's Chart, lab work & pertinent test results  History of Anesthesia Complications Negative for: history of anesthetic complications  Airway Mallampati: III  TM Distance: <3 FB Neck ROM: full    Dental  (+) Chipped   Pulmonary neg pulmonary ROS, neg shortness of breath   Pulmonary exam normal        Cardiovascular Exercise Tolerance: Good hypertension, + CAD and +CHF  (-) Past MI + dysrhythmias Atrial Fibrillation      Neuro/Psych  Headaches CVA, Residual Symptoms  negative psych ROS   GI/Hepatic Neg liver ROS,GERD  Controlled,,  Endo/Other  Hypothyroidism    Renal/GU Renal disease     Musculoskeletal  (+) Arthritis ,    Abdominal   Peds  Hematology negative hematology ROS (+)   Anesthesia Other Findings Past Medical History: No date: (HFpEF) heart failure with preserved ejection fraction (HCC)     Comment:  a. 08/2019 Echo: EF 60-65%, no rwma, mild LVH, Gr2 DD, nl              RV fxn. Nl pASP. Mildly dil LA. Mild to mod MR. No date: Anemia No date: Anxiety No date: Arthritis 10/2016: BRCA gene mutation negative     Comment:  NEGATIVE: [employer] 10/16/2016: Breast cancer (HCC)     Comment:  T1c,N0;, GRADE I/III, 1.6cm. ER/PR pos  HER2 not over               expressed, Right Upper Outer No date: Celiac disease No date: Celiac syndrome 07/17/2020: Cerebellar hemorrhage, acute (HCC) No date: Depression No date: Dyspnea     Comment:  WITH EXERTION No date: Fatty liver No date: GERD (gastroesophageal reflux disease)     Comment:  OCC No date: Glaucoma     Comment:  right eye No date: Headache     Comment:  H/O MIGRAINES AS TEENAGER. No date: Heart murmur     Comment:  a. 08/2019 Echo: mild to mod MR. No date: Hypertension No date: Hypothyroidism No date:  Non-obstructive CAD (coronary artery disease)     Comment:  a. 01/2017 MV: Hypertensive response. No               ischemia/infarct. EF >65%; b. 09/2019 Cor CTA: Ca2+ = 9.29              (36th percentile). LAD calcified plaque (0-24%), otw nl.               Multipel bilat pulm nodules up to 7mm. No date: Osteoporosis No date: PAF (paroxysmal atrial fibrillation) (HCC)     Comment:  a. 02/2020 Zio: predominantly RSR, 65 (50-105), rare               PACs/PVCs, 8 beats NSVT, multiple episodes of PAF lasting              up to 1hr . Avg AF rate 130 (93-170). AF burden               <1%. Triggered events = RSR, PACs, and PAF; b.               CHA2DS2VASc = 6. No date: Pneumonia     Comment:  YEARS AGO No date: Pre-diabetes 01/2021: Presence of Watchman left atrial appendage closure device No date: Pulmonary nodules     Comment:  a. 09/2019 Cor  CTA: incidental finding of multiple bilat               pulm nodules; b. 12/2019 High Res CT: stable, scattered               solid pulm nodules. No date: Raynaud's disease No date: RLS (restless legs syndrome) 06/2020: Stroke (HCC)     Comment:  mild balance issues No date: Wears hearing aid in both ears  Past Surgical History: No date: ABDOMINAL HYSTERECTOMY No date: APPENDECTOMY 03/05/2017: AXILLARY LYMPH NODE BIOPSY; Right     Comment:  Procedure: AXILLARY LYMPH NODE BIOPSY;  Surgeon:               Earline Mayotte, MD;  Location: [facility] ORS;  Service:               General;  Laterality: Right; No date: CATARACT EXTRACTION; Bilateral No date: CESAREAN SECTION 05/19/2015: COLONOSCOPY WITH PROPOFOL; N/A     Comment:  Procedure: COLONOSCOPY WITH PROPOFOL;  Surgeon: Scot Jun, MD;  Location: [facility] END[facility]COPY;  Service:               Endoscopy;  Laterality: N/A; 08/01/2019: COLONOSCOPY WITH PROPOFOL; N/A     Comment:  Procedure: COLONOSCOPY WITH PROPOFOL;  Surgeon: Midge Minium, MD;  Location: [facility] ENDOSCOPY;   Service:               Endoscopy;  Laterality: N/A; No date: EYE SURGERY No date: HAND SURGERY; Left 01/27/2021: LEFT ATRIAL APPENDAGE OCCLUSION; N/A     Comment:  Procedure: LEFT ATRIAL APPENDAGE OCCLUSION;  Surgeon:               Lanier Prude, MD;  Location: [facility] INVASIVE CV LAB;                Service: Cardiovascular;  Laterality: N/A; 11/07/2016: MASTECTOMY; Right     Comment:  RESIDUAL INVASIVE MAMMARY CARCINOMA, SUBAREOLAR ANTERIOR              TO PREVIOUS  04/11/2022: OPEN REDUCTION INTERNAL FIXATION (ORIF) DISTAL RADIAL  FRACTURE; Right     Comment:  Procedure: OPEN REDUCTION INTERNAL FIXATION (ORIF)               DISTAL RADIUS FRACTURE;  Surgeon: Kennedy Bucker, MD;                Location: [facility];  Service: Orthopedics;                Laterality: Right; 08/04/2022: OPEN REDUCTION INTERNAL FIXATION (ORIF) DISTAL RADIAL  FRACTURE; Left     Comment:  Procedure: OPEN REDUCTION INTERNAL FIXATION (ORIF)               DISTAL RADIUS FRACTURE;  Surgeon: Kennedy Bucker, MD;                Location: [facility];  Service: Orthopedics;                Laterality: Left; 11/07/2016: SENTINEL NODE BIOPSY; Right     Comment:  Procedure: SENTINEL NODE BIOPSY;  Surgeon: Earline Mayotte, MD;  Location: [facility] ORS;  Service: General;  Laterality: Right; 11/07/2016: SIMPLE MASTECTOMY WITH AXILLARY SENTINEL NODE BIOPSY; Right     Comment:  6 mm ER/PR 100%; Her 2 neu not overexpressed, T1b, N0.                Surgeon: Earline Mayotte, MD;  Location: [facility] ORS;                Service: General;  Laterality: Right; 01/27/2021: TEE WITHOUT CARDIOVERSION; N/A     Comment:  Procedure: TRANSESOPHAGEAL ECHOCARDIOGRAM (TEE);                Surgeon: Lanier Prude, MD;  [facility] INVASIVE               CV LAB;  Service: Cardiovascular;  Laterality: N/A; 03/10/2021: TEE WITHOUT CARDIOVERSION; N/A     Comment:  Procedure: TRANSESOPHAGEAL ECHOCARDIOGRAM  (TEE);                Surgeon: Parke Poisson, MD;  Location: [facility] ENDOSCOPY;              Service: Cardiology;  Laterality: N/A; AGE 78: TONSILLECTOMY 01/12/06: UPPER GI ENDOSCOPY     Comment:  hiatus hernia  BMI    Body Mass Index: 23.86 kg/m      Reproductive/Obstetrics negative OB ROS                             Anesthesia Physical Anesthesia Plan  ASA: 3  Anesthesia Plan: General ETT   Post-op Pain Management:    Induction: Intravenous  PONV Risk Score and Plan: Ondansetron, Dexamethasone, Midazolam and Treatment may vary due to age or medical condition  Airway Management Planned: Oral ETT  Additional Equipment:   Intra-op Plan:   Post-operative Plan: Extubation in OR  Informed Consent: I have reviewed the patients History and Physical, chart, labs and discussed the procedure including the risks, benefits and alternatives for the proposed anesthesia with the patient or authorized representative who has indicated his/her understanding and acceptance.     Dental Advisory Given  Plan Discussed with: Anesthesiologist, CRNA and Surgeon  Anesthesia Plan Comments: (Patient request GA because she does not want to be positioned for the spinal  Patient and daughter consented for risks of anesthesia including but not limited to:  - adverse reactions to medications - damage to eyes, teeth, lips or other oral mucosa - nerve damage due to positioning  - sore throat or hoarseness - Damage to heart, brain, nerves, lungs, other parts of body or loss of life  They voiced understanding and assent.)       Anesthesia Quick Evaluation

## 2023-07-22 ENCOUNTER — Inpatient Hospital Stay: Payer: Medicare (Managed Care)

## 2023-07-22 DIAGNOSIS — S72002A Fracture of unspecified part of neck of left femur, initial encounter for closed fracture: Secondary | ICD-10-CM | POA: Diagnosis not present

## 2023-07-22 LAB — CBC
HCT: 28.6 % — ABNORMAL LOW (ref 36.0–46.0)
Hemoglobin: 9.2 g/dL — ABNORMAL LOW (ref 12.0–15.0)
MCH: 30.1 pg (ref 26.0–34.0)
MCHC: 32.2 g/dL (ref 30.0–36.0)
MCV: 93.5 fL (ref 80.0–100.0)
Platelets: 223 10*3/uL (ref 150–400)
RBC: 3.06 MIL/uL — ABNORMAL LOW (ref 3.87–5.11)
RDW: 13.2 % (ref 11.5–15.5)
WBC: 11.1 10*3/uL — ABNORMAL HIGH (ref 4.0–10.5)
nRBC: 0 % (ref 0.0–0.2)

## 2023-07-22 LAB — BASIC METABOLIC PANEL
Anion gap: 6 (ref 5–15)
BUN: 14 mg/dL (ref 8–23)
CO2: 23 mmol/L (ref 22–32)
Calcium: 8.6 mg/dL — ABNORMAL LOW (ref 8.9–10.3)
Chloride: 104 mmol/L (ref 98–111)
Creatinine, Ser: 0.84 mg/dL (ref 0.44–1.00)
GFR, Estimated: >60 mL/min (ref >60)
Glucose, Bld: 158 mg/dL — ABNORMAL HIGH (ref 70–99)
Potassium: 4.8 mmol/L (ref 3.5–5.1)
Sodium: 133 mmol/L — ABNORMAL LOW (ref 135–145)

## 2023-07-22 MED ORDER — SENNOSIDES-DOCUSATE SODIUM 8.6-50 MG PO TABS
1.0000 | ORAL_TABLET | Freq: Two times a day (BID) | ORAL | Status: DC
  Administered 2023-07-22 – 2023-07-30 (×9): 1 via ORAL
  Filled 2023-07-22 (×10): qty 1

## 2023-07-22 MED ORDER — ALPRAZOLAM 0.5 MG PO TABS
0.5000 mg | ORAL_TABLET | Freq: Every day | ORAL | Status: DC
  Administered 2023-07-22: 0.5 mg via ORAL
  Filled 2023-07-22 (×2): qty 1

## 2023-07-22 MED ORDER — CELECOXIB 200 MG PO CAPS: 200.0000 mg | ORAL_CAPSULE | Freq: Two times a day (BID) | ORAL | Status: DC

## 2023-07-22 MED ORDER — IPRATROPIUM-ALBUTEROL 0.5-2.5 (3) MG/3ML IN SOLN: 3.0000 mL | RESPIRATORY_TRACT | Status: DC | PRN

## 2023-07-22 MED ORDER — LOSARTAN POTASSIUM 25 MG PO TABS
25.0000 mg | ORAL_TABLET | Freq: Every day | ORAL | Status: DC
Start: 2023-07-23 — End: 2023-07-24
  Administered 2023-07-23 – 2023-07-24 (×2): 25 mg via ORAL
  Filled 2023-07-22 (×2): qty 1

## 2023-07-22 MED ORDER — SIMETHICONE 80 MG PO CHEW
80.0000 mg | CHEWABLE_TABLET | Freq: Four times a day (QID) | ORAL | Status: DC
  Administered 2023-07-22 – 2023-07-30 (×30): 80 mg via ORAL
  Filled 2023-07-22 (×37): qty 1

## 2023-07-22 MED ORDER — OXYCODONE HCL 5 MG PO TABS: 2.5000 mg | ORAL_TABLET | ORAL | Status: DC | PRN

## 2023-07-22 MED ORDER — FUROSEMIDE 10 MG/ML IJ SOLN
40.0000 mg | Freq: Once | INTRAMUSCULAR | Status: AC
  Administered 2023-07-22: 40 mg via INTRAVENOUS
  Filled 2023-07-22: qty 4

## 2023-07-22 MED ORDER — KETOROLAC TROMETHAMINE 15 MG/ML IJ SOLN
15.0000 mg | Freq: Three times a day (TID) | INTRAMUSCULAR | Status: DC
  Administered 2023-07-22 – 2023-07-26 (×12): 15 mg via INTRAVENOUS
  Filled 2023-07-22 (×12): qty 1

## 2023-07-22 NOTE — Evaluation (Signed)
 Physical Therapy Evaluation Patient Details Name: Sandra [name] MRN: [id number] DOB: 1945-08-02 Today's Date: 07/22/2023  History of Present Illness  78 y.o. Caucasian female with medical history significant for HFpEF, paroxysmal atrial fibrillation (used to be on Eliquis and having after having ICH it was stopped and is currently having watchman's device and loop recorder), GERD, anxiety, osteoarthritis, depression, essential hypertension, hypothyroidism, nonobstructive coronary artery disease and prediabetes, RLS and CVA with residual problems with balance, who presented to the emergency room with acute onset of fall at a restaurant when she was trying to get up and lost her balance falling on the left side.  Pt is s/p L hemiarthroplasty on 07/21/23.    Clinical Impression  Pt getting bathed by NT upon arrival, however therapist spoke with daughter outside of the clinic and received subjective information necessary and also provided education for discharged options and expectations following surgery and therapy.  Once finished bathing, therapist assisted pt to get up and start ambulating.  Pt performed well with transfer to EOB, however required minA and verbal cuing for proper performance.  Pt attempted to stand and required modA to prevent pt from falling backwards into the bed.  Pt typically falls posteriorly according to pt and daughter.  Pt educated on 50% weight bearing precaution that is listed in chart from nursing, however not in formal orders.  Will reach out to MD for clarification.  Once in standing pt performs fairly well, however has resemblance of ataxic gait pattern at times.  HR was monitored due to history of afib, however pt likely in sinus rhythm due to pt's HR staying in 70-80's throughout ambulation.  Pt with multiple instances of instability and requiring modA at times to prevent uncontrolled fall.  Pt with one instance where she twisted the body to the L and placed more weight  on the L LE and that caused a "pop" according to the pt and hurt.  Pt noted the pain to resolve fairly quickly before ambulating back to the room and needing to utilize the restroom.  Pt assisted to the commode and left with nursing to monitor BP.  Pt may have orthostatic BP and would be best serve to have it assessed at next visit.  Pt then assisted to the recliner and was left with the call bell in reach, chair alarm on, and daughter in room.  Discussed d/c options with daughter again and current recommendations for pt to go STR due to being primary caregiver for husband and being unable to perform that at this time.  Daughter agreeable to this recommendation.      If plan is discharge home, recommend the following: A lot of help with walking and/or transfers;A lot of help with bathing/dressing/bathroom;Assist for transportation;Help with stairs or ramp for entrance   Can travel by private vehicle   Yes    Equipment Recommendations None recommended by PT  Recommendations for Other Services       Functional Status Assessment Patient has had a recent decline in their functional status and demonstrates the ability to make significant improvements in function in a reasonable and predictable amount of time.     Precautions / Restrictions Restrictions Weight Bearing Restrictions Per Provider Order: Yes LLE Weight Bearing Per Provider Order: Partial weight bearing LLE Partial Weight Bearing Percentage or Pounds: 50%      Mobility  Bed Mobility Overal bed mobility: Needs Assistance Bed Mobility: Supine to Sit           General  bed mobility comments: extra time and cuing required for proper performance.    Transfers Overall transfer level: Needs assistance Equipment used: Rolling walker (2 wheels) Transfers: Sit to/from Stand Sit to Stand: Min assist, Mod assist           General transfer comment: minA for coming into standing, and mildly unstable once in standing, requiring  modA to prevent uncontrolled posterior fall back into bed.    Ambulation/Gait Ambulation/Gait assistance: Contact guard assist Gait Distance (Feet): 160 Feet Assistive device: Rolling walker (2 wheels) Gait Pattern/deviations: Step-through pattern Gait velocity: decreased     General Gait Details: Pt with good tehcnique, however almost ataxic gait at times.  Pt very unsteady at times and requires cuing for pursed lip breathing multiple times without talking.  Stairs            Wheelchair Mobility     Tilt Bed    Modified Rankin (Stroke Patients Only)       Balance Overall balance assessment: Needs assistance Sitting-balance support: No upper extremity supported, Feet supported Sitting balance-Leahy Scale: Good     Standing balance support: Bilateral upper extremity supported, During functional activity, Reliant on assistive device for balance Standing balance-Leahy Scale: Poor                               Pertinent Vitals/Pain Pain Assessment Pain Assessment: Faces Faces Pain Scale: Hurts a little bit Pain Location: L hip aroudn incision site. Pain Descriptors / Indicators: Aching Pain Intervention(s): Monitored during session    Home Living Family/patient expects to be discharged to:: Private residence Living Arrangements: Spouse/significant other Available Help at Discharge: Family;Friend(s);Available PRN/intermittently Type of Home: House Home Access: Stairs to enter Entrance Stairs-Rails: Right Entrance Stairs-Number of Steps: 3-4 Alternate Level Stairs-Number of Steps: 14 Home Layout: Two level;Able to live on main level with bedroom/bathroom Home Equipment: Rollator (4 wheels);BSC/3in1 Additional Comments: Daughter is visiting but lives in [facility]; assist from granddaughter who lives nearby PRN    Prior Function Prior Level of Function : Independent/Modified Independent             Mobility Comments: Furniture cruises at home. Reports  multiple recent falls. Reports falls started happening since CVA ADLs Comments: Independent with ADLs. Caregiver for husband, completes all IADLs     Extremity/Trunk Assessment   Upper Extremity Assessment Upper Extremity Assessment: Overall WFL for tasks assessed    Lower Extremity Assessment Lower Extremity Assessment: LLE deficits/detail LLE Deficits / Details: weakness s/p surgical intervention.       Communication   Communication Factors Affecting Communication: Hearing impaired    Cognition Arousal: Alert Behavior During Therapy: WFL for tasks assessed/performed   PT - Cognitive impairments: No apparent impairments                                 Cueing       General Comments      Exercises     Assessment/Plan    PT Assessment Patient needs continued PT services  PT Problem List Decreased strength;Decreased activity tolerance;Decreased balance;Decreased mobility;Decreased knowledge of use of DME;Decreased safety awareness;Pain       PT Treatment Interventions DME instruction;Gait training;Stair training;Functional mobility training;Therapeutic activities;Therapeutic exercise;Balance training;Neuromuscular re-education    PT Goals (Current goals can be found in the Care Plan section)  Acute Rehab PT Goals Patient Stated Goal: to get stronger  and quit falling. PT Goal Formulation: With patient Time For Goal Achievement: 08/05/23 Potential to Achieve Goals: Fair    Frequency 7X/week     Co-evaluation               AM-PAC PT "6 Clicks" Mobility  Outcome Measure Help needed turning from your back to your side while in a flat bed without using bedrails?: A Little Help needed moving from lying on your back to sitting on the side of a flat bed without using bedrails?: A Little Help needed moving to and from a bed to a chair (including a wheelchair)?: A Little Help needed standing up from a chair using your arms (e.g., wheelchair or  bedside chair)?: A Little Help needed to walk in hospital room?: A Lot Help needed climbing 3-5 steps with a railing? : Total 6 Click Score: 15    End of Session Equipment Utilized During Treatment: Gait belt Activity Tolerance: Patient tolerated treatment well Patient left: in chair;with call bell/phone within reach;with chair alarm set;with family/visitor present;with nursing/sitter in room Nurse Communication: Mobility status PT Visit Diagnosis: Unsteadiness on feet (R26.81);Other abnormalities of gait and mobility (R26.89);Repeated falls (R29.6);Muscle weakness (generalized) (M62.81);History of falling (Z91.81);Ataxic gait (R26.0);Difficulty in walking, not elsewhere classified (R26.2);Dizziness and giddiness (R42)    Time: 8469-6295 PT Time Calculation (min) (ACUTE ONLY): 80 min   Charges:   PT Evaluation $PT Eval Moderate Complexity: 1 Mod PT Treatments $Therapeutic Activity: 23-37 mins $Self Care/Home Management: 23-37 PT General Charges $$ ACUTE PT VISIT: 1 Visit         Nolon Bussing, PT, DPT Physical Therapist - [facility]  07/22/23, 1:58 PM

## 2023-07-22 NOTE — Progress Notes (Signed)
 PROGRESS NOTE    KLOEE BALLEW  UEA:[id number] DOB: 1946/03/08 DOA: 07/20/2023 PCP: Louis Matte, MD    Brief Narrative:  78 y.o. Caucasian female with medical history significant for HFpEF, paroxysmal atrial fibrillation (used to be on Eliquis and having after having ICH it was stopped and is currently having watchman's device and loop recorder), GERD, anxiety, osteoarthritis, depression, essential hypertension, hypothyroidism, nonobstructive coronary artery disease and prediabetes, RLS and CVA with residual problems with balance, who presented to the emergency room with acute onset of fall at a restaurant when she was trying to get up and lost her balance falling on the left side.  She denied any presyncope or syncope.  No headache or dizziness or blurred vision.  No new paresthesias or focal muscle weakness.  No chest pain or palpitations.  No recent cough or wheezing or dyspnea.  No nausea or vomiting or abdominal pain.  No dysuria, oliguria or hematuria or flank pain.  No fever or chills.     Assessment & Plan:   Principal Problem:   Closed left hip fracture, initial encounter [facility]) Active Problems:   Essential hypertension   Dyslipidemia   Hypothyroidism   Paroxysmal atrial fibrillation (HCC)   GERD without esophagitis   Anxiety and depression   Chronic diastolic CHF (congestive heart failure) (HCC)  Closed left hip fracture, initial encounter (HCC) Mechanical fall No evidence of syncopal event Orthopedics consulted from ED Status post left hip hemiarthroplasty on 2/22 Plan: Start PT OT today Start chemoprophylaxis today.  SQ Lovenox x 14 days Pain control Anticipate need for skilled nursing facility Bowel regimen   Essential hypertension Continue home regimen   Dyslipidemia Continue home regimen   Hypothyroidism PTA Synthroid   Chronic diastolic CHF (congestive heart failure) (HCC) No evidence of exacerbation Beta-blocker and ARB   Anxiety and  depression PTA Xanax and Zoloft   GERD without esophagitis PPI   Paroxysmal atrial fibrillation (HCC) Amiodarone   DVT prophylaxis: SQ Lovenox Code Status: Full Family Communication:Daughter at bedside 2/22 Disposition Plan: Status is: Inpatient Remains inpatient appropriate because: hip fracture POD# 1   Level of care: Med-Surg  Consultants:  Orthopedics  Procedures:  Left hip hemiarthroplasty 2/22  Antimicrobials: Ancef, perioperative   Subjective: Seen and examined.  Resting in bed.  Reports mild pain when stationary  Objective: Vitals:   07/21/23 2008 07/21/23 2327 07/22/23 0343 07/22/23 0812  BP: 125/62 138/60 136/65 135/68  Pulse: 61 (!) 59 63 77  Resp: 18 18 18 16   Temp: 97.8 F (36.6 C) 98.2 F (36.8 C) 97.6 F (36.4 C) 98 F (36.7 C)  TempSrc:  Oral Oral Oral  SpO2: 99% 99% 100% 97%  Weight:      Height:        Intake/Output Summary (Last 24 hours) at 07/22/2023 1126 Last data filed at 07/22/2023 0815 Gross per 24 hour  Intake 440 ml  Output 1425 ml  Net -985 ml   Filed Weights   07/20/23 2037  Weight: 61.1 kg    Examination:  General exam: NAD Respiratory system: Lungs clear.  Normal work of breathing.  Room air Cardiovascular system: S1S2, reg rate, irreg rhythm, no murmur Gastrointestinal system: Soft, NTND, normal BS Central nervous system: Alert and oriented. No focal neurological deficits. Extremities: left hip dressing CDI Skin: No rashes, lesions or ulcers Psychiatry: Judgement and insight appear normal. Mood & affect appropriate.     Data Reviewed: I have personally reviewed following labs and imaging studies  CBC:  Recent Labs  Lab 07/20/23 1802 07/21/23 0333 07/22/23 0246  WBC 6.5 6.6 11.1*  HGB 10.8* 10.2* 9.2*  HCT 34.2* 31.5* 28.6*  MCV 95.3 93.5 93.5  PLT 248 222 223   Basic Metabolic Panel: Recent Labs  Lab 07/20/23 1802 07/21/23 0333 07/22/23 0246  NA 137 137 133*  K 3.7 4.5 4.8  CL 104 105 104   CO2 23 24 23   GLUCOSE 137* 124* 158*  BUN 18 18 14   CREATININE 0.82 0.90 0.84  CALCIUM 8.8* 8.4* 8.6*   GFR: Estimated Creatinine Clearance: 45.7 mL/min (by C-G formula based on SCr of 0.84 mg/dL). Liver Function Tests: No results for input(s): "AST", "ALT", "ALKPHOS", "BILITOT", "PROT", "ALBUMIN" in the last 168 hours. No results for input(s): "LIPASE", "AMYLASE" in the last 168 hours. No results for input(s): "AMMONIA" in the last 168 hours. Coagulation Profile: Recent Labs  Lab 07/20/23 1848  INR 1.1   Cardiac Enzymes: No results for input(s): "CKTOTAL", "CKMB", "CKMBINDEX", "TROPONINI" in the last 168 hours. BNP (last 3 results) No results for input(s): "PROBNP" in the last 8760 hours. HbA1C: No results for input(s): "HGBA1C" in the last 72 hours. CBG: No results for input(s): "GLUCAP" in the last 168 hours. Lipid Profile: No results for input(s): "CHOL", "HDL", "LDLCALC", "TRIG", "CHOLHDL", "LDLDIRECT" in the last 72 hours. Thyroid Function Tests: No results for input(s): "TSH", "T4TOTAL", "FREET4", "T3FREE", "THYROIDAB" in the last 72 hours. Anemia Panel: No results for input(s): "VITAMINB12", "FOLATE", "FERRITIN", "TIBC", "IRON", "RETICCTPCT" in the last 72 hours. Sepsis Labs: No results for input(s): "PROCALCITON", "LATICACIDVEN" in the last 168 hours.  Recent Results (from the past 240 hours)  Surgical PCR screen     Status: None   Collection Time: 07/20/23 10:05 PM   Specimen: Nasal Mucosa; Nasal Swab  Result Value Ref Range Status   MRSA, PCR NEGATIVE NEGATIVE Final   Staphylococcus aureus NEGATIVE NEGATIVE Final    Comment: (NOTE) The Xpert SA Assay (FDA approved for NASAL specimens in patients 55 years of age and older), is one component of a comprehensive surveillance program. It is not intended to diagnose infection nor to guide or monitor treatment. Performed at [facility], [address] [place], [place] [address]           Radiology Studies: DG HIP UNILAT WITH PELVIS 2-3 VIEWS LEFT Result Date: 07/21/2023 CLINICAL DATA:  Status post left hip surgery. EXAM: DG HIP (WITH OR WITHOUT PELVIS) 2-3V LEFT COMPARISON:  Pelvis and left hip radiographs 07/20/2023 FINDINGS: There is diffuse decreased bone mineralization. Interval left hip hemiarthroplasty. No perihardware lucency is seen to indicate hardware failure or loosening. Moderate right femoroacetabular, bilateral sacroiliac, and mild pubic symphysis joint space narrowing. Mild-to-moderate superolateral right acetabular and mild superior pubic symphysis degenerative osteophytosis. Moderate right and mild left inferior sacroiliac peripheral osteophytes with mild right-greater-than-left sacroiliac subchondral sclerosis. No acute fracture or dislocation. Postoperative lateral left hip subcutaneous air and lateral left hip surgical skin staples. Vascular phleboliths overlie the pelvis. IMPRESSION: Interval left hip hemiarthroplasty without evidence of hardware failure. Electronically Signed   By: Neita Garnet M.D.   On: 07/21/2023 13:24   DG Chest 1 View Result Date: 07/20/2023 CLINICAL DATA:  Fall. EXAM: CHEST  1 VIEW COMPARISON:  Oct 10, 2022. FINDINGS: Stable cardiomediastinal silhouette. Both lungs are clear. The visualized skeletal structures are unremarkable. IMPRESSION: No active disease. Electronically Signed   By: Lupita Raider M.D.   On: 07/20/2023 18:21   DG Elbow Complete Left Result  Date: 07/20/2023 CLINICAL DATA:  Left elbow pain after fall. EXAM: LEFT ELBOW - COMPLETE 3+ VIEW COMPARISON:  None Available. FINDINGS: There is no evidence of fracture, dislocation, or joint effusion. There is no evidence of arthropathy or other focal bone abnormality. Soft tissues are unremarkable. IMPRESSION: Negative. Electronically Signed   By: Lupita Raider M.D.   On: 07/20/2023 18:20   DG Hip Unilat W or Wo Pelvis 2-3 Views Left Result Date: 07/20/2023 CLINICAL DATA:   Left hip pain after fall. EXAM: DG HIP (WITH OR WITHOUT PELVIS) 2-3V LEFT COMPARISON:  None Available. FINDINGS: Moderately displaced proximal left femoral neck fracture is noted. Moderate degenerative changes seen involving the right hip. IMPRESSION: Moderately displaced proximal left femoral neck fracture. Electronically Signed   By: Lupita Raider M.D.   On: 07/20/2023 18:19        Scheduled Meds:  amiodarone  100 mg Oral Daily   atorvastatin  40 mg Oral Daily   calcium-vitamin D  1 tablet Oral Daily   cholecalciferol  5,000 Units Oral Daily   enoxaparin (LOVENOX) injection  30 mg Subcutaneous Q24H   gabapentin  300 mg Oral TID   iron polysaccharides  150 mg Oral Daily   levothyroxine  100 mcg Oral Q0600   losartan  75 mg Oral Daily   multivitamin with minerals  1 tablet Oral Q1500   pantoprazole  40 mg Oral Daily   senna-docusate  1 tablet Oral BID   simethicone  80 mg Oral QID   timolol  1 drop Both Eyes BID   Continuous Infusions:     LOS: 2 days     Tresa Moore, MD [facility]   If 7PM-7AM, please contact night-coverage  07/22/2023, 11:26 AM

## 2023-07-22 NOTE — Progress Notes (Signed)
 Subjective: 1 Day Post-Op Procedure(s) (LRB): ARTHROPLASTY BIPOLAR HIP (HEMIARTHROPLASTY) (Left) Patient is awake and alert and fully oriented.  Her daughter is present as well.  She is doing very well and had a good night.  Her pain is mild to moderate.  Daryl Eastern is to get her out of bed later today hopefully.  Hemoglobin is stable at 9.2.  Vital signs are normal.  I advised her that she will probably need to go to a skilled nursing facility for rehab since she has a husband at home who is required her help to get around.  Patient reports pain as mild.  Objective:   VITALS:   Vitals:   07/22/23 0343 07/22/23 0812  BP: 136/65 135/68  Pulse: 63 77  Resp: 18 16  Temp: 97.6 F (36.4 C) 98 F (36.7 C)  SpO2: 100% 97%    Neurologically intact Incision: dressing C/D/I The hip is stable.  There is no swelling in the leg.  Dressing remains very dry.  No other areas of pain.  LABS Recent Labs    07/20/23 1802 07/21/23 0333 07/22/23 0246  HGB 10.8* 10.2* 9.2*  HCT 34.2* 31.5* 28.6*  WBC 6.5 6.6 11.1*  PLT 248 222 223    Recent Labs    07/20/23 1802 07/21/23 0333 07/22/23 0246  NA 137 137 133*  K 3.7 4.5 4.8  BUN 18 18 14   CREATININE 0.82 0.90 0.84  GLUCOSE 137* 124* 158*    Recent Labs    07/20/23 1848  INR 1.1     Assessment/Plan: 1 Day Post-Op Procedure(s) (LRB): ARTHROPLASTY BIPOLAR HIP (HEMIARTHROPLASTY) (Left)   Advance diet Up with therapy Discharge to SNF when medically stable. Switch to 81 mg ASA twice daily on discharge Return to office 2 weeks after discharge for exam and x-rays

## 2023-07-22 NOTE — Progress Notes (Signed)
 Initial Nutrition Assessment  DOCUMENTATION CODES:   Not applicable  INTERVENTION:  Continue with current diet.  Ensure Plus High Protein po BID, each supplement provides 350 kcal and 20 grams of protein. MVI   NUTRITION DIAGNOSIS:   Increased nutrient needs related to post-op healing as evidenced by estimated needs.    GOAL:   Patient will meet greater than or equal to 90% of their needs    MONITOR:   PO intake  REASON FOR ASSESSMENT:   Consult Assessment of nutrition requirement/status  ASSESSMENT:  78 y.o. F, presented to ED with complaints of left side pain after a fall while at a restaurant. Admitting with left hip fracture secondary to mechanical fall.  PMH; HFpEF, paroxysmal atrial fibrillation (used to be on Eliquis and having after having ICH it was stopped and is currently having watchman's device and loop recorder), GERD, anxiety, osteoarthritis, depression, essential hypertension, hypothyroidism, nonobstructive coronary artery disease and prediabetes, RLS and CVA with residual problems with balance.  Review of EMR revealed; Post-op ARTHROPLASTY BIPOLAR HIP (HEMIARTHROPLASTY) (Left), with mild to moderate pain.  No appetite declines or weight loss.  Independent feeding ability.  Receiving oral supplements.   Admit weight: 61.1 kg Weight history:  07/20/23 61.1 kg  07/11/23 56.2 kg  06/06/23 57.6 kg  05/15/23 56.7 kg  02/14/23 56.9 kg  02/05/23 58.9 kg  01/18/23 58.6 kg  10/18/22 58.3 kg  10/10/22 59.2 kg  09/27/22 58.5 kg      Average Meal Intake: 90% intake x 1 recorded meals  Nutritionally Relevant Medications: Scheduled Meds:  atorvastatin  40 mg Oral Daily   calcium-vitamin D  1 tablet Oral Daily   cholecalciferol  5,000 Units Oral Daily   gabapentin  300 mg Oral TID   iron polysaccharides  150 mg Oral Daily   multivitamin with minerals  1 tablet Oral Q1500   senna-docusate  1 tablet Oral BID   PRN Meds:.acetaminophen, ALPRAZolam, alum  & mag hydroxide-simeth, bisacodyl, magnesium hydroxide, meclizine, menthol-cetylpyridinium **OR** phenol, methocarbamol **OR** methocarbamol (ROBAXIN) injection, metoCLOPramide **OR** metoCLOPramide (REGLAN) injection, metoprolol tartrate, morphine injection, ondansetron (ZOFRAN) IV, ondansetron, oxyCODONE, polyvinyl alcohol, sodium phosphate, traZODone  Labs Reviewed    NUTRITION - FOCUSED PHYSICAL EXAM:  Deferred   Diet Order:   Diet Order             Diet regular Room service appropriate? Yes; Fluid consistency: Thin  Diet effective now                   EDUCATION NEEDS:   Education needs have been addressed  Skin:  Skin Assessment: Reviewed RN Assessment  Last BM:  PTA  Height:   Ht Readings from Last 1 Encounters:  07/20/23 5\' 3"  (1.6 m)    Weight:   Wt Readings from Last 1 Encounters:  07/20/23 61.1 kg    Ideal Body Weight:     BMI:  Body mass index is 23.86 kg/m.  Estimated Nutritional Needs:   Kcal:  1850-2150 kcal  Protein:  80-100 g  Fluid:  72ml/cal    Jamelle Haring RDN, LDN Clinical Dietitian   If unable to reach, please contact "[facility] Inpatient" secure chat group between 8 am-4 pm daily"

## 2023-07-23 ENCOUNTER — Encounter: Payer: Self-pay | Admitting: Specialist

## 2023-07-23 ENCOUNTER — Ambulatory Visit (INDEPENDENT_AMBULATORY_CARE_PROVIDER_SITE_OTHER): Payer: Medicare (Managed Care)

## 2023-07-23 DIAGNOSIS — I5032 Chronic diastolic (congestive) heart failure: Secondary | ICD-10-CM | POA: Diagnosis not present

## 2023-07-23 DIAGNOSIS — S72002A Fracture of unspecified part of neck of left femur, initial encounter for closed fracture: Secondary | ICD-10-CM | POA: Diagnosis not present

## 2023-07-23 LAB — BASIC METABOLIC PANEL
Anion gap: 7 (ref 5–15)
BUN: 19 mg/dL (ref 8–23)
CO2: 26 mmol/L (ref 22–32)
Calcium: 8.4 mg/dL — ABNORMAL LOW (ref 8.9–10.3)
Chloride: 103 mmol/L (ref 98–111)
Creatinine, Ser: 0.83 mg/dL (ref 0.44–1.00)
GFR, Estimated: >60 mL/min (ref >60)
Glucose, Bld: 116 mg/dL — ABNORMAL HIGH (ref 70–99)
Potassium: 4.1 mmol/L (ref 3.5–5.1)
Sodium: 136 mmol/L (ref 135–145)

## 2023-07-23 LAB — CBC
HCT: 27.1 % — ABNORMAL LOW (ref 36.0–46.0)
Hemoglobin: 8.9 g/dL — ABNORMAL LOW (ref 12.0–15.0)
MCH: 30.5 pg (ref 26.0–34.0)
MCHC: 32.8 g/dL (ref 30.0–36.0)
MCV: 92.8 fL (ref 80.0–100.0)
Platelets: 219 10*3/uL (ref 150–400)
RBC: 2.92 MIL/uL — ABNORMAL LOW (ref 3.87–5.11)
RDW: 13.4 % (ref 11.5–15.5)
WBC: 7.3 10*3/uL (ref 4.0–10.5)
nRBC: 0 % (ref 0.0–0.2)

## 2023-07-23 MED ORDER — FUROSEMIDE 10 MG/ML IJ SOLN
40.0000 mg | Freq: Once | INTRAMUSCULAR | Status: AC
  Administered 2023-07-23: 40 mg via INTRAVENOUS
  Filled 2023-07-23: qty 4

## 2023-07-23 MED ORDER — POLYETHYLENE GLYCOL 3350 17 G PO PACK
17.0000 g | PACK | Freq: Every day | ORAL | Status: DC
  Administered 2023-07-23 – 2023-07-27 (×3): 17 g via ORAL
  Filled 2023-07-23 (×5): qty 1

## 2023-07-23 MED ORDER — BRIMONIDINE TARTRATE 0.2 % OP SOLN
1.0000 [drp] | Freq: Three times a day (TID) | OPHTHALMIC | Status: DC
  Administered 2023-07-23 – 2023-07-30 (×21): 1 [drp] via OPHTHALMIC
  Filled 2023-07-23: qty 5

## 2023-07-23 MED ORDER — ALPRAZOLAM 0.25 MG PO TABS
0.2500 mg | ORAL_TABLET | Freq: Every day | ORAL | Status: DC
  Administered 2023-07-24 – 2023-07-29 (×7): 0.25 mg via ORAL
  Filled 2023-07-23 (×7): qty 1

## 2023-07-23 MED ORDER — ALPRAZOLAM 0.25 MG PO TABS: 0.2500 mg | ORAL_TABLET | Freq: Every day | ORAL | Status: DC

## 2023-07-23 NOTE — Plan of Care (Signed)
   Problem: Education: Goal: Knowledge of General Education information will improve Description Including pain rating scale, medication(s)/side effects and non-pharmacologic comfort measures Outcome: Progressing   Problem: Clinical Measurements: Goal: Ability to maintain clinical measurements within normal limits will improve Outcome: Progressing   Problem: Clinical Measurements: Goal: Will remain free from infection Outcome: Progressing

## 2023-07-23 NOTE — Progress Notes (Signed)
 Dr. Hyacinth Meeker approached the nursing station stating that the patient needed a Aquacel (long) on the incision site right away.  Patient primary nurse off the floor. Nurse placed the long Aquacel AG dressing  on the left hip.

## 2023-07-23 NOTE — Progress Notes (Signed)
 Physical Therapy Treatment Patient Details Name: Sandra [name] MRN: [id number] DOB: [birth year] Today's Date: 07/23/2023   History of Present Illness 78 y.o. Caucasian female with medical history significant for HFpEF, paroxysmal atrial fibrillation (used to be on Eliquis and having after having ICH it was stopped and is currently having watchman's device and loop recorder), GERD, anxiety, osteoarthritis, depression, essential hypertension, hypothyroidism, nonobstructive coronary artery disease and prediabetes, RLS and CVA with residual problems with balance, who presented to the emergency room with acute onset of fall at a restaurant when she was trying to get up and lost her balance falling on the left side.  Pt is s/p L hemiarthroplasty on 07/21/23.      PT Comments  Patient seated in recliner with family at bedside upon arrival, agreeable to PT tx session. Patient able to stand from Recliner with Min A, poor balance noted even with RW for support, often R lateral/posterior lean requiring assist from therapist to correct. Patient able to ambulate x 30 ft but limited this date due to orthostatic BP. BP dropped to 86/52. Improvements with seated rest break, but patient symptomatic. RN/MD notified. Patient left in recliner with all needs in reach, daughter at bedside. Patient will continue to benefit from acute skilled PT services to address impairments, and maximize functional mobility. Will continue to follow acutely.      If plan is discharge home, recommend the following: A lot of help with walking and/or transfers;A lot of help with bathing/dressing/bathroom;Assist for transportation;Help with stairs or ramp for entrance   Can travel by private vehicle     Yes  Equipment Recommendations  None recommended by PT    Recommendations for Other Services       Precautions / Restrictions Restrictions Weight Bearing Restrictions Per Provider Order: Yes LLE Weight Bearing Per Provider Order:  Partial weight bearing LLE Partial Weight Bearing Percentage or Pounds: 50%     Mobility  Bed Mobility               General bed mobility comments: not observed; patient recieved and left in recliner.    Transfers Overall transfer level: Needs assistance Equipment used: Rolling walker (2 wheels) Transfers: Sit to/from Stand Sit to Stand: Min assist           General transfer comment: Pt require Min A to stand from recliner, increased R lateral/posterior lean require assist for balance. Able to improve with cues; but difficult to maintain    Ambulation/Gait Ambulation/Gait assistance: Contact guard assist Gait Distance (Feet): 30 Feet Assistive device: Rolling walker (2 wheels)   Gait velocity: Decreased     General Gait Details: Pt able to ambulate with RW, x 30 ft. Limited distance due to orthostatic BP (see below for details). Pt with poor balance, still with intermittent lateral lean and slightly ataxic gait. (hx of cerebellum CVA in 2022)   Stairs             Wheelchair Mobility     Tilt Bed    Modified Rankin (Stroke Patients Only)       Balance Overall balance assessment: Needs assistance Sitting-balance support: No upper extremity supported, Feet supported Sitting balance-Leahy Scale: Good     Standing balance support: Bilateral upper extremity supported, During functional activity, Reliant on assistive device for balance Standing balance-Leahy Scale: Poor Standing balance comment: poor standing balance overall; increased lateral/posterior lean requiring therapist assist to maintain balance; high risk for falls  Communication Communication Factors Affecting Communication: Hearing impaired  Cognition Arousal: Alert Behavior During Therapy: WFL for tasks assessed/performed   PT - Cognitive impairments: No apparent impairments                         Following commands: Intact       Cueing    Exercises      General Comments General comments (skin integrity, edema, etc.): BP monitored: 118/58 seated. With mobility, patient symptomatic/lightheaded. BP closely monitored. BP: 86/52. Further gait deferred due to safety concerns. RN presented to room and notified, MD notified via secure chat.      Pertinent Vitals/Pain Pain Assessment Pain Assessment: Faces Faces Pain Scale: Hurts a little bit Pain Location: L Hip Pain Descriptors / Indicators: Aching Pain Intervention(s): Limited activity within patient's tolerance, Monitored during session    Home Living                          Prior Function            PT Goals (current goals can now be found in the care plan section) Acute Rehab PT Goals Patient Stated Goal: to get stronger and quit falling. PT Goal Formulation: With patient Time For Goal Achievement: 08/05/23 Potential to Achieve Goals: Fair Progress towards PT goals: Progressing toward goals    Frequency    7X/week      PT Plan      Co-evaluation              AM-PAC PT "6 Clicks" Mobility   Outcome Measure  Help needed turning from your back to your side while in a flat bed without using bedrails?: A Little Help needed moving from lying on your back to sitting on the side of a flat bed without using bedrails?: A Little Help needed moving to and from a bed to a chair (including a wheelchair)?: A Little Help needed standing up from a chair using your arms (e.g., wheelchair or bedside chair)?: A Little Help needed to walk in hospital room?: A Lot Help needed climbing 3-5 steps with a railing? : Total 6 Click Score: 15    End of Session Equipment Utilized During Treatment: Gait belt Activity Tolerance: Patient tolerated treatment well Patient left: in chair;with call bell/phone within reach;with chair alarm set;with family/visitor present;with nursing/sitter in room Nurse Communication: Mobility status PT Visit Diagnosis:  Unsteadiness on feet (R26.81);Other abnormalities of gait and mobility (R26.89);Repeated falls (R29.6);Muscle weakness (generalized) (M62.81);History of falling (Z91.81);Ataxic gait (R26.0);Difficulty in walking, not elsewhere classified (R26.2);Dizziness and giddiness (R42)     Time: 3086-5784 PT Time Calculation (min) (ACUTE ONLY): 29 min  Charges:    $Gait Training: 8-22 mins $Therapeutic Activity: 8-22 mins                       Creed Copper Fairly, PT, DPT 07/23/23 12:20 PM

## 2023-07-23 NOTE — Progress Notes (Signed)
 PROGRESS NOTE    Sandra [name]  GNF:[id number] DOB: June 26, 1945 DOA: 07/20/2023 PCP: Louis Matte, MD    Brief Narrative:  78 y.o. Caucasian female with medical history significant for HFpEF, paroxysmal atrial fibrillation (used to be on Eliquis and having after having ICH it was stopped and is currently having watchman's device and loop recorder), GERD, anxiety, osteoarthritis, depression, essential hypertension, hypothyroidism, nonobstructive coronary artery disease and prediabetes, RLS and CVA with residual problems with balance, who presented to the emergency room with acute onset of fall at a restaurant when she was trying to get up and lost her balance falling on the left side.  She denied any presyncope or syncope.  No headache or dizziness or blurred vision.  No new paresthesias or focal muscle weakness.  No chest pain or palpitations.  No recent cough or wheezing or dyspnea.  No nausea or vomiting or abdominal pain.  No dysuria, oliguria or hematuria or flank pain.  No fever or chills.     Assessment & Plan:   Principal Problem:   Closed left hip fracture, initial encounter [facility]) Active Problems:   Essential hypertension   Dyslipidemia   Hypothyroidism   Paroxysmal atrial fibrillation (HCC)   GERD without esophagitis   Anxiety and depression   Chronic diastolic CHF (congestive heart failure) (HCC)  Closed left hip fracture Mechanical fall No evidence of syncopal event Orthopedics consulted from ED Status post left hip hemiarthroplasty on 2/22 Plan: Continue therapy efforts DVT prophylaxis, subcu Lovenox x 14 days Pain control Will need skilled nursing facility Pending bowel movement   Essential hypertension Blood pressure low normal.   Decrease losartan from home dose of 75 mg daily to 25   Dyslipidemia Continue home regimen   Hypothyroidism PTA Synthroid   Chronic diastolic CHF (congestive heart failure) (HCC) No evidence of exacerbation Continue  ARB.  Dose decreased to 25 mg   Anxiety and depression PTA Xanax and Zoloft   GERD without esophagitis PPI   Paroxysmal atrial fibrillation (HCC) Amiodarone   DVT prophylaxis: SQ Lovenox Code Status: Full Family Communication:Daughter at bedside 2/22, via phone on 2/23, at bedside 2/24 Disposition Plan: Status is: Inpatient Remains inpatient appropriate because: hip fracture POD# 2   Level of care: Med-Surg  Consultants:  Orthopedics  Procedures:  Left hip hemiarthroplasty 2/22  Antimicrobials: Ancef, perioperative   Subjective: Seen and examined.  Resting in bed.  Awaiting bone  Objective: Vitals:   07/22/23 2303 07/23/23 0401 07/23/23 0410 07/23/23 0807  BP: 136/64 (!) 128/56  (!) 131/55  Pulse: 64 71  70  Resp: 20 18  16   Temp: 97.9 F (36.6 C) 97.8 F (36.6 C)  98.3 F (36.8 C)  TempSrc: Oral Oral    SpO2: 99% (!) 89% 97% 97%  Weight:      Height:        Intake/Output Summary (Last 24 hours) at 07/23/2023 1216 Last data filed at 07/23/2023 0900 Gross per 24 hour  Intake 240 ml  Output 1200 ml  Net -960 ml   Filed Weights   07/20/23 2037  Weight: 61.1 kg    Examination:  General exam: No acute distress Respiratory system: Lungs clear.  Normal work of breathing.  Room air Cardiovascular system: S1-S2, RRR, no murmur, no pedal edema Gastrointestinal system: Soft, NTND, normal BS Central nervous system: Alert and oriented. No focal neurological deficits. Extremities: left hip dressing CDI Skin: No rashes, lesions or ulcers Psychiatry: Judgement and insight appear normal. Mood & affect  appropriate.     Data Reviewed: I have personally reviewed following labs and imaging studies  CBC: Recent Labs  Lab 07/20/23 1802 07/21/23 0333 07/22/23 0246 07/23/23 0407  WBC 6.5 6.6 11.1* 7.3  HGB 10.8* 10.2* 9.2* 8.9*  HCT 34.2* 31.5* 28.6* 27.1*  MCV 95.3 93.5 93.5 92.8  PLT 248 222 223 219   Basic Metabolic Panel: Recent Labs  Lab  07/20/23 1802 07/21/23 0333 07/22/23 0246 07/23/23 0407  NA 137 137 133* 136  K 3.7 4.5 4.8 4.1  CL 104 105 104 103  CO2 23 24 23 26   GLUCOSE 137* 124* 158* 116*  BUN 18 18 14 19   CREATININE 0.82 0.90 0.84 0.83  CALCIUM 8.8* 8.4* 8.6* 8.4*   GFR: Estimated Creatinine Clearance: 46.2 mL/min (by C-G formula based on SCr of 0.83 mg/dL). Liver Function Tests: No results for input(s): "AST", "ALT", "ALKPHOS", "BILITOT", "PROT", "ALBUMIN" in the last 168 hours. No results for input(s): "LIPASE", "AMYLASE" in the last 168 hours. No results for input(s): "AMMONIA" in the last 168 hours. Coagulation Profile: Recent Labs  Lab 07/20/23 1848  INR 1.1   Cardiac Enzymes: No results for input(s): "CKTOTAL", "CKMB", "CKMBINDEX", "TROPONINI" in the last 168 hours. BNP (last 3 results) No results for input(s): "PROBNP" in the last 8760 hours. HbA1C: No results for input(s): "HGBA1C" in the last 72 hours. CBG: No results for input(s): "GLUCAP" in the last 168 hours. Lipid Profile: No results for input(s): "CHOL", "HDL", "LDLCALC", "TRIG", "CHOLHDL", "LDLDIRECT" in the last 72 hours. Thyroid Function Tests: No results for input(s): "TSH", "T4TOTAL", "FREET4", "T3FREE", "THYROIDAB" in the last 72 hours. Anemia Panel: No results for input(s): "VITAMINB12", "FOLATE", "FERRITIN", "TIBC", "IRON", "RETICCTPCT" in the last 72 hours. Sepsis Labs: No results for input(s): "PROCALCITON", "LATICACIDVEN" in the last 168 hours.  Recent Results (from the past 240 hours)  Surgical PCR screen     Status: None   Collection Time: 07/20/23 10:05 PM   Specimen: Nasal Mucosa; Nasal Swab  Result Value Ref Range Status   MRSA, PCR NEGATIVE NEGATIVE Final   Staphylococcus aureus NEGATIVE NEGATIVE Final    Comment: (NOTE) The Xpert SA Assay (FDA approved for NASAL specimens in patients 38 years of age and older), is one component of a comprehensive surveillance program. It is not intended to diagnose  infection nor to guide or monitor treatment. Performed at [facility], [address], [place], [place] [address]          Radiology Studies: DG Chest [place] 1 View Result Date: 07/22/2023 CLINICAL DATA:  Hypoxia EXAM: PORTABLE CHEST 1 VIEW COMPARISON:  07/20/2023 FINDINGS: Single frontal view of the chest demonstrates stable enlargement of the cardiac silhouette. Left atrial appendage occlusion device and loop recorder unchanged. There is central pulmonary vascular congestion, with patchy perihilar airspace disease. No effusion or pneumothorax. No acute bony abnormalities. IMPRESSION: 1. Congestive heart failure with mild perihilar edema. Electronically Signed   By: Sharlet Salina M.D.   On: 07/22/2023 21:09        Scheduled Meds:  ALPRAZolam  0.5 mg Oral QHS   amiodarone  100 mg Oral Daily   atorvastatin  40 mg Oral Daily   calcium-vitamin D  1 tablet Oral Daily   cholecalciferol  5,000 Units Oral Daily   enoxaparin (LOVENOX) injection  30 mg Subcutaneous Q24H   furosemide  40 mg Intravenous Once   iron polysaccharides  150 mg Oral Daily   ketorolac  15 mg Intravenous Q8H  levothyroxine  100 mcg Oral Q0600   losartan  25 mg Oral Daily   multivitamin with minerals  1 tablet Oral Q1500   pantoprazole  40 mg Oral Daily   polyethylene glycol  17 g Oral Daily   senna-docusate  1 tablet Oral BID   simethicone  80 mg Oral QID   timolol  1 drop Both Eyes BID   Continuous Infusions:     LOS: 3 days     Tresa Moore, MD [facility]   If 7PM-7AM, please contact night-coverage  07/23/2023, 12:16 PM

## 2023-07-23 NOTE — Progress Notes (Signed)
 Subjective: 2 Days Post-Op Procedure(s) (LRB): ARTHROPLASTY BIPOLAR HIP (HEMIARTHROPLASTY) (Left)   Patient is awake alert and out of bed in a chair today.  Her daughter is with her.  She reports much less pain.  She walked to the door and back.  Her hemoglobin is stable at 8.9.  Her wound is clean and dry.  Her hip is stable.  Patient reports pain as mild.  Objective:   VITALS:   Vitals:   07/23/23 0410 07/23/23 0807  BP:  (!) 131/55  Pulse:  70  Resp:  16  Temp:  98.3 F (36.8 C)  SpO2: 97% 97%    Neurologically intact Incision: dressing C/D/I The hip is stable.  Minimal pain with movement.  LABS Recent Labs    07/21/23 0333 07/22/23 0246 07/23/23 0407  HGB 10.2* 9.2* 8.9*  HCT 31.5* 28.6* 27.1*  WBC 6.6 11.1* 7.3  PLT 222 223 219    Recent Labs    07/21/23 0333 07/22/23 0246 07/23/23 0407  NA 137 133* 136  K 4.5 4.8 4.1  BUN 18 14 19   CREATININE 0.90 0.84 0.83  GLUCOSE 124* 158* 116*    Recent Labs    07/20/23 1848  INR 1.1     Assessment/Plan: 2 Days Post-Op Procedure(s) (LRB): ARTHROPLASTY BIPOLAR HIP (HEMIARTHROPLASTY) (Left)   Advance diet Up with therapy Discharge to SNF when medically stable.

## 2023-07-23 NOTE — Care Management Important Message (Signed)
 Important Message  Patient Details  Name: Sandra [name] MRN: [id number] Date of Birth: 1945/08/21   Important Message Given:  Yes - Medicare IM     Cristela Blue, CMA 07/23/2023, 11:35 AM

## 2023-07-24 ENCOUNTER — Encounter: Payer: Self-pay | Admitting: Cardiology

## 2023-07-24 DIAGNOSIS — S72002A Fracture of unspecified part of neck of left femur, initial encounter for closed fracture: Secondary | ICD-10-CM | POA: Diagnosis not present

## 2023-07-24 LAB — BASIC METABOLIC PANEL
Anion gap: 7 (ref 5–15)
BUN: 23 mg/dL (ref 8–23)
CO2: 27 mmol/L (ref 22–32)
Calcium: 8.2 mg/dL — ABNORMAL LOW (ref 8.9–10.3)
Chloride: 101 mmol/L (ref 98–111)
Creatinine, Ser: 0.93 mg/dL (ref 0.44–1.00)
GFR, Estimated: >60 mL/min (ref >60)
Glucose, Bld: 112 mg/dL — ABNORMAL HIGH (ref 70–99)
Potassium: 4.3 mmol/L (ref 3.5–5.1)
Sodium: 135 mmol/L (ref 135–145)

## 2023-07-24 LAB — CBC
HCT: 25.6 % — ABNORMAL LOW (ref 36.0–46.0)
Hemoglobin: 8.3 g/dL — ABNORMAL LOW (ref 12.0–15.0)
MCH: 30.1 pg (ref 26.0–34.0)
MCHC: 32.4 g/dL (ref 30.0–36.0)
MCV: 92.8 fL (ref 80.0–100.0)
Platelets: 197 10*3/uL (ref 150–400)
RBC: 2.76 MIL/uL — ABNORMAL LOW (ref 3.87–5.11)
RDW: 13.4 % (ref 11.5–15.5)
WBC: 6.2 10*3/uL (ref 4.0–10.5)
nRBC: 0 % (ref 0.0–0.2)

## 2023-07-24 LAB — IRON AND TIBC
Iron: 13 ug/dL — ABNORMAL LOW (ref 28–170)
Saturation Ratios: 5 % — ABNORMAL LOW (ref 10.4–31.8)
TIBC: 248 ug/dL — ABNORMAL LOW (ref 250–450)
UIBC: 235 ug/dL

## 2023-07-24 LAB — CUP PACEART REMOTE DEVICE CHECK
Date Time Interrogation Session: 20250223232338
Implantable Pulse Generator Implant Date: 20240522

## 2023-07-24 LAB — FERRITIN: Ferritin: 65 ng/mL (ref 11–307)

## 2023-07-24 MED ORDER — ALBUMIN HUMAN 25 % IV SOLN
25.0000 g | Freq: Once | INTRAVENOUS | Status: AC
  Administered 2023-07-24: 25 g via INTRAVENOUS
  Filled 2023-07-24: qty 100

## 2023-07-24 MED ORDER — IRON SUCROSE 300 MG IVPB - SIMPLE MED
300.0000 mg | Freq: Every day | Status: AC
  Administered 2023-07-24 – 2023-07-25 (×2): 300 mg via INTRAVENOUS
  Filled 2023-07-24 (×2): qty 300

## 2023-07-24 NOTE — TOC Progression Note (Signed)
 Transition of Care [facility]) - Progression Note    Patient Details  Name: Sandra [name] MRN: [id number] Date of Birth: 1946/04/15  [facility] CM/SW Contact  Marlowe Sax, RN Phone Number: 07/24/2023, 12:43 PM  Clinical Narrative:     Met with the patient and her daughter at the bedside They are agreeable to a bed search PASSR obtainedm FL2 completed Bedsearch sent       Expected Discharge Plan and Services                                               Social Determinants of Health (SDOH) Interventions SDOH Screenings   Food Insecurity: No Food Insecurity (07/20/2023)  Housing: Low Risk  (07/20/2023)  Transportation Needs: Unmet Transportation Needs (07/20/2023)  Utilities: At Risk (07/20/2023)  Alcohol Screen: Low Risk  (06/22/2021)  Depression (PHQ2-9): Low Risk  (06/22/2021)  Financial Resource Strain: Low Risk  (05/18/2021)  Physical Activity: Insufficiently Active (05/18/2021)  Social Connections: Socially Integrated (07/20/2023)  Stress: No Stress Concern Present (05/18/2021)  Tobacco Use: Low Risk  (07/20/2023)    Readmission Risk Interventions     No data to display

## 2023-07-24 NOTE — Progress Notes (Signed)
 Physical Therapy Treatment Patient Details Name: Sandra [name] MRN: [id number] DOB: [birth year] Today's Date: 07/24/2023   History of Present Illness 78 y.o. Caucasian female with medical history significant for HFpEF, paroxysmal atrial fibrillation (used to be on Eliquis and having after having ICH it was stopped and is currently having watchman's device and loop recorder), GERD, anxiety, osteoarthritis, depression, essential hypertension, hypothyroidism, nonobstructive coronary artery disease and prediabetes, RLS and CVA with residual problems with balance, who presented to the emergency room with acute onset of fall at a restaurant when she was trying to get up and lost her balance falling on the left side.  Pt is s/p L hemiarthroplasty on 07/21/23.    PT Comments  Pt seen this am for skilled PT session. BP assessed prior to mobility. Sitting 102/54, HR 62  Standing 93/44, HR 64 pt asymptomatic, nursing notified. Pt tolerated modified gait training in hall with RW, PWB L LE with close CGA due to occasional unsteadiness. Pt also required repeated vc's to maintain wt bearing restrictions after pt demonstrated that she could stand on L LE only - picking R LE completely off the floor. Afternoon session focused on reviewing Hip precaution booklet, importance of following MD's recs for wt bearing restrictions, hip precautions, and B LE HEP. Pt and daughter each given a copy or education booklet. Initial recommendations for STR prior to returning home remain appropriate. Also discussed benefits of transitioning to ALF due to increased physical needs of both pt and husband.    If plan is discharge home, recommend the following: A little help with walking and/or transfers;A little help with bathing/dressing/bathroom;Assistance with cooking/housework;Direct supervision/assist for medications management;Direct supervision/assist for financial management;Assist for transportation;Help with stairs or ramp for  entrance   Can travel by private vehicle     Yes  Equipment Recommendations  None recommended by PT (TBD at next level of care)    Recommendations for Other Services       Precautions / Restrictions Precautions Precautions: Posterior Hip Precaution Booklet Issued: Yes (comment) Recall of Precautions/Restrictions: Impaired Precaution/Restrictions Comments: Needs some reminders able to recall 1 of 3 Required Braces or Orthoses:  (Abd wedge soiled, pt educated to use pillows) Restrictions Weight Bearing Restrictions Per Provider Order: Yes LLE Weight Bearing Per Provider Order: Weight bearing as tolerated LLE Partial Weight Bearing Percentage or Pounds: 50%     Mobility  Bed Mobility               General bed mobility comments: not observed; patient recieved and left in recliner.    Transfers Overall transfer level: Needs assistance Equipment used: Rolling walker (2 wheels) Transfers: Sit to/from Stand Sit to Stand: Contact guard assist           General transfer comment: Cues to kick L LE out in front during transfers and proper technique to rise straight up in chair    Ambulation/Gait Ambulation/Gait assistance: Contact guard assist Gait Distance (Feet): 90 Feet Assistive device: Rolling walker (2 wheels) Gait Pattern/deviations: Step-through pattern, Antalgic, Decreased weight shift to left Gait velocity: Decreased     General Gait Details: Pt slightly ataxic gait. (hx of cerebellum CVA in 2022)   Stairs             Wheelchair Mobility     Tilt Bed    Modified Rankin (Stroke Patients Only)       Balance Overall balance assessment: Needs assistance Sitting-balance support: No upper extremity supported, Feet supported Sitting balance-Leahy Scale: Good  Standing balance support: Bilateral upper extremity supported, During functional activity, Reliant on assistive device for balance Standing balance-Leahy Scale: Fair Standing  balance comment: Fair standing balance with RW due to hx of CVA, Wt bearing restrictions                            Communication Communication Communication: Impaired Factors Affecting Communication: Hearing impaired  Cognition Arousal: Alert Behavior During Therapy: WFL for tasks assessed/performed   PT - Cognitive impairments: No apparent impairments                         Following commands: Intact      Cueing Cueing Techniques: Verbal cues, Visual cues  Exercises Total Joint Exercises Ankle Circles/Pumps: AROM, Both, 10 reps Quad Sets: AROM, 5 reps Gluteal Sets: AROM, 5 reps Long Arc Quad: AROM, Both, 10 reps    General Comments General comments (skin integrity, edema, etc.):  (Discussed role of PT, recent hip surgery precautions and wt bearing restrictions. Booklet reviewed as well as LE exercises. Educated pt on currentl LOF and benefits of STR.)      Pertinent Vitals/Pain Pain Assessment Pain Assessment: 0-10 Pain Score: 4  Pain Location: L Hip Pain Descriptors / Indicators: Aching Pain Intervention(s): Monitored during session    Home Living                          Prior Function            PT Goals (current goals can now be found in the care plan section) Acute Rehab PT Goals Patient Stated Goal: to get stronger and quit falling. Progress towards PT goals: Progressing toward goals    Frequency    7X/week      PT Plan      Co-evaluation              AM-PAC PT "6 Clicks" Mobility   Outcome Measure  Help needed turning from your back to your side while in a flat bed without using bedrails?: A Little Help needed moving from lying on your back to sitting on the side of a flat bed without using bedrails?: A Little Help needed moving to and from a bed to a chair (including a wheelchair)?: A Little Help needed standing up from a chair using your arms (e.g., wheelchair or bedside chair)?: A Little Help needed  to walk in hospital room?: A Little Help needed climbing 3-5 steps with a railing? : Total 6 Click Score: 16    End of Session Equipment Utilized During Treatment: Gait belt Activity Tolerance: Patient tolerated treatment well Patient left: in chair;with call bell/phone within reach;with chair alarm set Nurse Communication: Mobility status PT Visit Diagnosis: Unsteadiness on feet (R26.81);Other abnormalities of gait and mobility (R26.89);Repeated falls (R29.6);Muscle weakness (generalized) (M62.81);History of falling (Z91.81);Ataxic gait (R26.0);Difficulty in walking, not elsewhere classified (R26.2);Dizziness and giddiness (R42)     Time: 1610-9604 PT Time Calculation (min) (ACUTE ONLY): 23 min  Charges:    $Gait Training: 8-22 mins $Therapeutic Exercise: 8-22 mins $Therapeutic Activity: 8-22 mins PT General Charges $$ ACUTE PT VISIT: 1 Visit                    Zadie Cleverly, PTA  Jannet Askew 07/24/2023, 4:54 PM

## 2023-07-24 NOTE — [place] FL2 (Signed)
 [place] MEDICAID FL2 LEVEL OF CARE FORM     IDENTIFICATION  Patient Name: Sandra [name] Birthdate: 10/15/1945 Sex: female Admission Date (Current Location): 07/20/2023  [facility] and [place] Number:  Chiropodist and Address:  [facility], [address], [place], [place] [address]      Provider Number: [id number]  Attending Physician Name and Address:  Tresa Moore, MD  Relative Name and Phone Number:  Garrel Ridgel  Daughter  Emergency Contact  [phone]  [email or web address]  [phone]  Ruthann Cancer [phone]    Current Level of Care: Hospital Recommended Level of Care: Skilled Nursing Facility Prior Approval Number:    Date Approved/Denied:   PASRR Number: [id number]  Discharge Plan: SNF    Current Diagnoses: Patient Active Problem List   Diagnosis Date Noted   Closed left hip fracture, initial encounter (HCC) 07/20/2023   Dyslipidemia 07/20/2023   GERD without esophagitis 07/20/2023   Anxiety and depression 07/20/2023   Chronic diastolic CHF (congestive heart failure) (HCC) 07/20/2023   Tachycardia-bradycardia syndrome (HCC) 09/23/2022   NAFLD (nonalcoholic fatty liver disease) 84/16/6063   Atrial fibrillation with RVR (HCC) 04/22/2022   Atrial fibrillation with rapid ventricular response (HCC) 04/21/2022   Demand ischemia (HCC) 04/21/2022   Pyelonephritis 04/09/2022   Acute pyelonephritis 04/07/2022   Dizziness 04/07/2022   Right arm fracture 04/02/2022   Recurrent falls 04/02/2022   Closed nondisplaced fracture of styloid process of right ulna 04/02/2022   Closed fracture of right distal radius 04/02/2022   Closed nondisplaced fracture of cuboid bone of right foot 04/02/2022   Presence of Watchman left atrial appendage closure device    PAF (paroxysmal atrial fibrillation) (HCC) 01/27/2021   Secondary hypercoagulable state (HCC) 12/30/2020   Hemorrhagic cerebrovascular accident (CVA) (HCC) 07/16/2020    Paroxysmal atrial fibrillation (HCC)    History of colonic polyps    Chronic heart failure with preserved ejection fraction (HCC) 05/17/2017   Essential hypertension 05/17/2017   Glaucoma (increased eye pressure) 02/06/2017   Invasive ductal carcinoma of breast, female, right (HCC) 10/20/2016   Prolapse of vaginal vault after hysterectomy 08/24/2016   Vaginal atrophy 08/24/2016   Dyspareunia in female 08/24/2016   Colon polyps 01/06/2015   BCC (basal cell carcinoma of skin) 01/06/2015   Iron deficiency anemia 01/06/2015   Hypothyroidism 01/06/2015   Vitamin D deficiency 01/06/2015   Hyperlipidemia LDL goal <70 01/06/2015   Depression, major 01/06/2015   GAD (generalized anxiety disorder) 01/06/2015   RLS (restless legs syndrome) 01/06/2015   Cataract 01/06/2015   Raynaud phenomenon 01/06/2015   GERD (gastroesophageal reflux disease) 01/06/2015   Celiac disease 01/06/2015   OA (osteoarthritis) 01/06/2015   Osteoporosis 01/06/2015   Pre-diabetes 01/06/2015   Avitaminosis D 01/06/2015   Raynaud's syndrome without gangrene 01/06/2015   Age-related osteoporosis without current pathological fracture 01/06/2015   Anxiety disorder 01/05/2015    Orientation RESPIRATION BLADDER Height & Weight     Self, Time, Situation, Place  Normal Continent Weight: 61.1 kg Height:  5\' 3"  (160 cm)  BEHAVIORAL SYMPTOMS/MOOD NEUROLOGICAL BOWEL NUTRITION STATUS      Continent Diet (regular)  AMBULATORY STATUS COMMUNICATION OF NEEDS Skin   Limited Assist Verbally Normal, Surgical wounds                       Personal Care Assistance Level of Assistance  Bathing, Feeding, Dressing Bathing Assistance: Limited assistance Feeding assistance: Independent Dressing Assistance: Limited assistance  Functional Limitations Info  Sight, Hearing, Speech Sight Info: Adequate Hearing Info: Adequate Speech Info: Adequate    SPECIAL CARE FACTORS FREQUENCY  OT (By licensed OT), PT (By licensed PT)      PT Frequency: 5 times per week OT Frequency: 5 times per week            Contractures Contractures Info: Not present    Additional Factors Info  Code Status Code Status Info: full code             Current Medications (07/24/2023):  This is the current hospital active medication list Current Facility-Administered Medications  Medication Dose Route Frequency Provider Last Rate Last Admin   acetaminophen (TYLENOL) tablet 650 mg  650 mg Oral Q6H PRN Sreenath, Sudheer B, MD       albumin human 25 % solution 25 g  25 g Intravenous Once Lolita Patella B, MD       ALPRAZolam Prudy Feeler) tablet 0.25 mg  0.25 mg Oral QHS Jawo, Modou L, NP   0.25 mg at 07/24/23 0018   alum & mag hydroxide-simeth (MAALOX/MYLANTA) 200-200-20 MG/5ML suspension 30 mL  30 mL Oral Q4H PRN Deeann Saint, MD       amiodarone (PACERONE) tablet 100 mg  100 mg Oral Daily Deeann Saint, MD   100 mg at 07/24/23 2440   atorvastatin (LIPITOR) tablet 40 mg  40 mg Oral Daily Deeann Saint, MD   40 mg at 07/24/23 1027   bisacodyl (DULCOLAX) suppository 10 mg  10 mg Rectal Daily PRN Deeann Saint, MD   10 mg at 07/24/23 0930   brimonidine (ALPHAGAN) 0.2 % ophthalmic solution 1 drop  1 drop Both Eyes TID Lolita Patella B, MD   1 drop at 07/24/23 2536   calcium-vitamin D (OSCAL WITH D) 500-5 MG-MCG per tablet 1 tablet  1 tablet Oral Daily Deeann Saint, MD   1 tablet at 07/24/23 6440   cholecalciferol (VITAMIN D3) tablet 5,000 Units  5,000 Units Oral Daily Deeann Saint, MD   5,000 Units at 07/24/23 0929   enoxaparin (LOVENOX) injection 30 mg  30 mg Subcutaneous Q24H Deeann Saint, MD   30 mg at 07/24/23 3474   ipratropium-albuterol (DUONEB) 0.5-2.5 (3) MG/3ML nebulizer solution 3 mL  3 mL Nebulization Q4H PRN Lolita Patella B, MD       iron sucrose (VENOFER) 300 mg in sodium chloride 0.9 % 250 mL IVPB  300 mg Intravenous Q1200 Georgeann Oppenheim, Sudheer B, MD 176.7 mL/hr at 07/24/23 1219 300 mg at 07/24/23 1219    ketorolac (TORADOL) 15 MG/ML injection 15 mg  15 mg Intravenous Q8H Sreenath, Sudheer B, MD   15 mg at 07/24/23 0532   levothyroxine (SYNTHROID) tablet 100 mcg  100 mcg Oral Q0600 Deeann Saint, MD   100 mcg at 07/24/23 0532   magnesium hydroxide (MILK OF MAGNESIA) suspension 30 mL  30 mL Oral Daily PRN Deeann Saint, MD       meclizine (ANTIVERT) tablet 25 mg  25 mg Oral TID PRN Deeann Saint, MD       menthol-cetylpyridinium (CEPACOL) lozenge 3 mg  1 lozenge Oral PRN Deeann Saint, MD       Or   phenol (CHLORASEPTIC) mouth spray 1 spray  1 spray Mouth/Throat PRN Deeann Saint, MD       methocarbamol (ROBAXIN) tablet 500 mg  500 mg Oral Q6H PRN Deeann Saint, MD   500 mg at 07/23/23 0406   Or   methocarbamol (ROBAXIN) injection 500 mg  500 mg Intravenous Q6H PRN Deeann Saint, MD       metoCLOPramide (REGLAN) tablet 5-10 mg  5-10 mg Oral Q8H PRN Deeann Saint, MD       Or   metoCLOPramide (REGLAN) injection 5-10 mg  5-10 mg Intravenous Q8H PRN Deeann Saint, MD       metoprolol tartrate (LOPRESSOR) tablet 12.5 mg  12.5 mg Oral PRN Deeann Saint, MD       morphine (PF) 2 MG/ML injection 2 mg  2 mg Intravenous Q3H PRN Lolita Patella B, MD       multivitamin with minerals tablet 1 tablet  1 tablet Oral Q1500 Deeann Saint, MD   1 tablet at 07/23/23 1453   ondansetron (ZOFRAN) injection 4 mg  4 mg Intravenous Q4H PRN Deeann Saint, MD       ondansetron [facility]) tablet 4 mg  4 mg Oral Q6H PRN Deeann Saint, MD       oxyCODONE (Oxy IR/ROXICODONE) immediate release tablet 2.5-5 mg  2.5-5 mg Oral Q4H PRN Lolita Patella B, MD       pantoprazole (PROTONIX) EC tablet 40 mg  40 mg Oral Daily Deeann Saint, MD   40 mg at 07/24/23 6962   polyethylene glycol (MIRALAX / GLYCOLAX) packet 17 g  17 g Oral Daily Lolita Patella B, MD   17 g at 07/24/23 0930   polyvinyl alcohol (LIQUIFILM TEARS) 1.4 % ophthalmic solution 1 drop  1 drop Both Eyes QID PRN Deeann Saint, MD       senna-docusate  (Senokot-S) tablet 1 tablet  1 tablet Oral BID Lolita Patella B, MD   1 tablet at 07/24/23 9528   simethicone (MYLICON) chewable tablet 80 mg  80 mg Oral QID Lolita Patella B, MD   80 mg at 07/24/23 4132   sodium phosphate (FLEET) enema 1 enema  1 enema Rectal Once PRN Deeann Saint, MD       traZODone (DESYREL) tablet 25 mg  25 mg Oral QHS PRN Mansy, Vernetta Honey, MD         Discharge Medications: Please see discharge summary for a list of discharge medications.  Relevant Imaging Results:  Relevant Lab Results:   Additional Information [id number]  Marlowe Sax, RN

## 2023-07-24 NOTE — Progress Notes (Signed)
 Subjective: 3 Days Post-Op Procedure(s) (LRB): ARTHROPLASTY BIPOLAR HIP (HEMIARTHROPLASTY) (Left) Patient is alert and doing well.  She standing up today.  She is somewhat pale.  She has not had a bowel movement yet.  Urine is quite concentrated.  Hemoglobin is 8.3.  Blood pressure is stable.  Dressing is dry.  Social worker is supposedly working on her case and will speak to them today.  Patient reports pain as mild.  Objective:   VITALS:   Vitals:   07/23/23 2300 07/24/23 0816  BP: (!) 121/51 (!) 132/58  Pulse: 70 64  Resp: 17 16  Temp: 98.1 F (36.7 C) 97.7 F (36.5 C)  SpO2: 95% 94%    Neurologically intact Incision: dressing C/D/I Hip is stable.  LABS Recent Labs    07/22/23 0246 07/23/23 0407 07/24/23 0304  HGB 9.2* 8.9* 8.3*  HCT 28.6* 27.1* 25.6*  WBC 11.1* 7.3 6.2  PLT 223 219 197    Recent Labs    07/22/23 0246 07/23/23 0407 07/24/23 0304  NA 133* 136 135  K 4.8 4.1 4.3  BUN 14 19 23   CREATININE 0.84 0.83 0.93  GLUCOSE 158* 116* 112*    No results for input(s): "LABPT", "INR" in the last 72 hours.   Assessment/Plan: 3 Days Post-Op Procedure(s) (LRB): ARTHROPLASTY BIPOLAR HIP (HEMIARTHROPLASTY) (Left)   Advance diet Up with therapy Discharge to SNF Remain partial weightbearing with walker for 2 weeks after discharge. 81 mg ASA twice daily for 6 weeks after discharge Return to office in 2 weeks for exam and x-rays

## 2023-07-24 NOTE — Progress Notes (Signed)
 PROGRESS NOTE    Sandra [name]  WUJ:[id number] DOB: 07-26-1945 DOA: 07/20/2023 PCP: Louis Matte, MD    Brief Narrative:  78 y.o. Caucasian female with medical history significant for HFpEF, paroxysmal atrial fibrillation (used to be on Eliquis and having after having ICH it was stopped and is currently having watchman's device and loop recorder), GERD, anxiety, osteoarthritis, depression, essential hypertension, hypothyroidism, nonobstructive coronary artery disease and prediabetes, RLS and CVA with residual problems with balance, who presented to the emergency room with acute onset of fall at a restaurant when she was trying to get up and lost her balance falling on the left side.  She denied any presyncope or syncope.  No headache or dizziness or blurred vision.  No new paresthesias or focal muscle weakness.  No chest pain or palpitations.  No recent cough or wheezing or dyspnea.  No nausea or vomiting or abdominal pain.  No dysuria, oliguria or hematuria or flank pain.  No fever or chills.     Assessment & Plan:   Principal Problem:   Closed left hip fracture, initial encounter [facility]) Active Problems:   Essential hypertension   Dyslipidemia   Hypothyroidism   Paroxysmal atrial fibrillation (HCC)   GERD without esophagitis   Anxiety and depression   Chronic diastolic CHF (congestive heart failure) (HCC)  Closed left hip fracture Mechanical fall No evidence of syncopal event Orthopedics consulted from ED Status post left hip hemiarthroplasty on 2/22 Plan: Continue therapy efforts DVT prophylaxis, subcu Lovenox x 14 days Pain control Will need skilled nursing facility BM reported today   Essential hypertension Orthostatic hypotension Patient with symptomatic orthostatic drop upon position change Plan: Hold home losartan Continue orthostatics Bilateral TED hose Encourage p.o. fluid intake Avoid IV fluids Albumin bolus 25 g 25% x 1 Consider abd binder if  mobility efforts are hampered by orthostasis  Hypoxia Likely secondary to mild fluid overload.  Received Lasix doses on 2/23 and 2/24 Urine appears quite concentrated Will not redose Lasix at this time Encourage p.o. fluid intake Avoid IV fluids given propensity to cause pulmonary edema  Severe iron deficiency anemia Patient on oral iron replacement however iron stores remain markedly low with low saturation ratios Plan: Hold home iron for now IV Venofer 300 mg daily x 2 doses  Dyslipidemia Continue home regimen   Hypothyroidism PTA Synthroid   Chronic diastolic CHF (congestive heart failure) (HCC) No evidence of exacerbation ARB held in setting of orthostatic hypotension   Anxiety and depression PTA Xanax and Zoloft   GERD without esophagitis PPI   Paroxysmal atrial fibrillation (HCC) Amiodarone   DVT prophylaxis: SQ Lovenox Code Status: Full Family Communication:Daughter at bedside 2/22, via phone on 2/23, at bedside 2/24 Disposition Plan: Status is: Inpatient Remains inpatient appropriate because: hip fracture POD# 3   Level of care: Med-Surg  Consultants:  Orthopedics  Procedures:  Left hip hemiarthroplasty 2/22  Antimicrobials: Ancef, perioperative   Subjective: Seen and examined.  Sitting up in chair.  Overall, feels well.  Weaned to room air.  Objective: Vitals:   07/23/23 0807 07/23/23 1544 07/23/23 2300 07/24/23 0816  BP: (!) 131/55 (!) 123/58 (!) 121/51 (!) 132/58  Pulse: 70 66 70 64  Resp: 16 16 17 16   Temp: 98.3 F (36.8 C) 98 F (36.7 C) 98.1 F (36.7 C) 97.7 F (36.5 C)  TempSrc:    Oral  SpO2: 97% 98% 95% 94%  Weight:      Height:  Intake/Output Summary (Last 24 hours) at 07/24/2023 1326 Last data filed at 07/24/2023 1013 Gross per 24 hour  Intake 480 ml  Output 350 ml  Net 130 ml   Filed Weights   07/20/23 2037  Weight: 61.1 kg    Examination:  General exam: NAD Respiratory system: Lungs clear.  Normal work  of breathing.  Room air Cardiovascular system: S1-S2, RRR, no murmur, no pedal edema Gastrointestinal system: Soft, NTND, normal BS Central nervous system: Alert and oriented. No focal neurological deficits. Extremities: left hip dressing CDI, gait not assessed Skin: No rashes, lesions or ulcers, appears pale Psychiatry: Judgement and insight appear normal. Mood & affect appropriate.     Data Reviewed: I have personally reviewed following labs and imaging studies  CBC: Recent Labs  Lab 07/20/23 1802 07/21/23 0333 07/22/23 0246 07/23/23 0407 07/24/23 0304  WBC 6.5 6.6 11.1* 7.3 6.2  HGB 10.8* 10.2* 9.2* 8.9* 8.3*  HCT 34.2* 31.5* 28.6* 27.1* 25.6*  MCV 95.3 93.5 93.5 92.8 92.8  PLT 248 222 223 219 197   Basic Metabolic Panel: Recent Labs  Lab 07/20/23 1802 07/21/23 0333 07/22/23 0246 07/23/23 0407 07/24/23 0304  NA 137 137 133* 136 135  K 3.7 4.5 4.8 4.1 4.3  CL 104 105 104 103 101  CO2 23 24 23 26 27   GLUCOSE 137* 124* 158* 116* 112*  BUN 18 18 14 19 23   CREATININE 0.82 0.90 0.84 0.83 0.93  CALCIUM 8.8* 8.4* 8.6* 8.4* 8.2*   GFR: Estimated Creatinine Clearance: 41.2 mL/min (by C-G formula based on SCr of 0.93 mg/dL). Liver Function Tests: No results for input(s): "AST", "ALT", "ALKPHOS", "BILITOT", "PROT", "ALBUMIN" in the last 168 hours. No results for input(s): "LIPASE", "AMYLASE" in the last 168 hours. No results for input(s): "AMMONIA" in the last 168 hours. Coagulation Profile: Recent Labs  Lab 07/20/23 1848  INR 1.1   Cardiac Enzymes: No results for input(s): "CKTOTAL", "CKMB", "CKMBINDEX", "TROPONINI" in the last 168 hours. BNP (last 3 results) No results for input(s): "PROBNP" in the last 8760 hours. HbA1C: No results for input(s): "HGBA1C" in the last 72 hours. CBG: No results for input(s): "GLUCAP" in the last 168 hours. Lipid Profile: No results for input(s): "CHOL", "HDL", "LDLCALC", "TRIG", "CHOLHDL", "LDLDIRECT" in the last 72  hours. Thyroid Function Tests: No results for input(s): "TSH", "T4TOTAL", "FREET4", "T3FREE", "THYROIDAB" in the last 72 hours. Anemia Panel: Recent Labs    07/24/23 0434  FERRITIN 65  TIBC 248*  IRON 13*   Sepsis Labs: No results for input(s): "PROCALCITON", "LATICACIDVEN" in the last 168 hours.  Recent Results (from the past 240 hours)  Surgical PCR screen     Status: None   Collection Time: 07/20/23 10:05 PM   Specimen: Nasal Mucosa; Nasal Swab  Result Value Ref Range Status   MRSA, PCR NEGATIVE NEGATIVE Final   Staphylococcus aureus NEGATIVE NEGATIVE Final    Comment: (NOTE) The Xpert SA Assay (FDA approved for NASAL specimens in patients 17 years of age and older), is one component of a comprehensive surveillance program. It is not intended to diagnose infection nor to guide or monitor treatment. Performed at [facility], [address] [place], [place] [address]          Radiology Studies: DG Chest [place] 1 View Result Date: 07/22/2023 CLINICAL DATA:  Hypoxia EXAM: PORTABLE CHEST 1 VIEW COMPARISON:  07/20/2023 FINDINGS: Single frontal view of the chest demonstrates stable enlargement of the cardiac silhouette. Left atrial appendage occlusion device  and loop recorder unchanged. There is central pulmonary vascular congestion, with patchy perihilar airspace disease. No effusion or pneumothorax. No acute bony abnormalities. IMPRESSION: 1. Congestive heart failure with mild perihilar edema. Electronically Signed   By: Sharlet Salina M.D.   On: 07/22/2023 21:09        Scheduled Meds:  ALPRAZolam  0.25 mg Oral QHS   amiodarone  100 mg Oral Daily   atorvastatin  40 mg Oral Daily   brimonidine  1 drop Both Eyes TID   calcium-vitamin D  1 tablet Oral Daily   cholecalciferol  5,000 Units Oral Daily   enoxaparin (LOVENOX) injection  30 mg Subcutaneous Q24H   ketorolac  15 mg Intravenous Q8H   levothyroxine  100 mcg Oral Q0600   multivitamin with minerals  1  tablet Oral Q1500   pantoprazole  40 mg Oral Daily   polyethylene glycol  17 g Oral Daily   senna-docusate  1 tablet Oral BID   simethicone  80 mg Oral QID   Continuous Infusions:  albumin human     iron sucrose 300 mg (07/24/23 1219)      LOS: 4 days     Tresa Moore, MD [facility]   If 7PM-7AM, please contact night-coverage  07/24/2023, 1:26 PM

## 2023-07-24 NOTE — Plan of Care (Signed)

## 2023-07-25 ENCOUNTER — Ambulatory Visit: Payer: Medicare (Managed Care) | Admitting: Cardiology

## 2023-07-25 DIAGNOSIS — S72002A Fracture of unspecified part of neck of left femur, initial encounter for closed fracture: Secondary | ICD-10-CM | POA: Diagnosis not present

## 2023-07-25 LAB — CBC
HCT: 28.7 % — ABNORMAL LOW (ref 36.0–46.0)
Hemoglobin: 9.3 g/dL — ABNORMAL LOW (ref 12.0–15.0)
MCH: 30 pg (ref 26.0–34.0)
MCHC: 32.4 g/dL (ref 30.0–36.0)
MCV: 92.6 fL (ref 80.0–100.0)
Platelets: 263 10*3/uL (ref 150–400)
RBC: 3.1 MIL/uL — ABNORMAL LOW (ref 3.87–5.11)
RDW: 13.4 % (ref 11.5–15.5)
WBC: 5.5 10*3/uL (ref 4.0–10.5)
nRBC: 0 % (ref 0.0–0.2)

## 2023-07-25 LAB — BASIC METABOLIC PANEL
Anion gap: 9 (ref 5–15)
BUN: 16 mg/dL (ref 8–23)
CO2: 27 mmol/L (ref 22–32)
Calcium: 8.9 mg/dL (ref 8.9–10.3)
Chloride: 102 mmol/L (ref 98–111)
Creatinine, Ser: 0.76 mg/dL (ref 0.44–1.00)
GFR, Estimated: >60 mL/min (ref >60)
Glucose, Bld: 117 mg/dL — ABNORMAL HIGH (ref 70–99)
Potassium: 4.4 mmol/L (ref 3.5–5.1)
Sodium: 138 mmol/L (ref 135–145)

## 2023-07-25 LAB — PHOSPHORUS: Phosphorus: 3.8 mg/dL (ref 2.5–4.6)

## 2023-07-25 LAB — MAGNESIUM: Magnesium: 2.3 mg/dL (ref 1.7–2.4)

## 2023-07-25 LAB — VITAMIN B12: Vitamin B-12: 372 pg/mL (ref 180–914)

## 2023-07-25 MED ORDER — VITAMIN B-12 1000 MCG PO TABS
500.0000 ug | ORAL_TABLET | Freq: Every day | ORAL | Status: DC
  Administered 2023-07-25 – 2023-07-30 (×6): 500 ug via ORAL
  Filled 2023-07-25 (×6): qty 1

## 2023-07-25 MED ORDER — IRON SUCROSE 300 MG IVPB - SIMPLE MED
300.0000 mg | Freq: Once | Status: AC
  Administered 2023-07-26: 300 mg via INTRAVENOUS
  Filled 2023-07-25: qty 300

## 2023-07-25 NOTE — Progress Notes (Addendum)
 Physical Therapy Treatment Patient Details Name: Sandra [name] MRN: [id number] DOB: 1945-11-17 Today's Date: 07/25/2023   History of Present Illness 78 y.o. Caucasian female with medical history significant for HFpEF, paroxysmal atrial fibrillation (used to be on Eliquis and having after having ICH it was stopped and is currently having watchman's device and loop recorder), GERD, anxiety, osteoarthritis, depression, essential hypertension, hypothyroidism, nonobstructive coronary artery disease and prediabetes, RLS and CVA with residual problems with balance, who presented to the emergency room with acute onset of fall at a restaurant when she was trying to get up and lost her balance falling on the left side.  Pt is s/p L hemiarthroplasty on 07/21/23.    PT Comments  Pt was long sitting in bed upon arrival. She is alert and endorses feeling better today than previous date however pain is a little worse. Pt did tolerate getting OOB, standing to RW, and ambulating ~ 75 ft. No symptoms of orthostatic hypotension. Pt was educated on importance of performing HEP. She performed and tolerated well. Dc recs remain appropriate to maximize her independence and safety with all ADLs. Pt is not at her baseline abilities.    If plan is discharge home, recommend the following: A little help with walking and/or transfers;A little help with bathing/dressing/bathroom;Assistance with cooking/housework;Direct supervision/assist for medications management;Direct supervision/assist for financial management;Assist for transportation;Help with stairs or ramp for entrance     Equipment Recommendations  Other (comment) (defer to next level of care)       Precautions / Restrictions Precautions Precautions: Posterior Hip Precaution Booklet Issued: Yes (comment) Recall of Precautions/Restrictions: Impaired Precaution/Restrictions Comments: recalled 2 of 3 Restrictions Weight Bearing Restrictions Per Provider Order:  Yes LLE Weight Bearing Per Provider Order: Partial weight bearing LLE Partial Weight Bearing Percentage or Pounds: 50%     Mobility  Bed Mobility Overal bed mobility: Needs Assistance Bed Mobility: Supine to Sit  Supine to sit: Min assist   Transfers Overall transfer level: Needs assistance Equipment used: Rolling walker (2 wheels) Transfers: Sit to/from Stand Sit to Stand: Contact guard assist  General transfer comment: CGA for safety with vcs for technique improvements    Ambulation/Gait Ambulation/Gait assistance: Contact guard assist Gait Distance (Feet): 75 Feet Assistive device: Rolling walker (2 wheels) Gait Pattern/deviations: Step-through pattern, Antalgic, Decreased weight shift to left  General Gait Details: pt ambulated ~ 75 ft with Rw. no LOB or safety concern. Distance limited by pain/fatigue   Balance Overall balance assessment: Needs assistance Sitting-balance support: No upper extremity supported, Feet supported Sitting balance-Leahy Scale: Good     Standing balance support: Bilateral upper extremity supported, During functional activity, Reliant on assistive device for balance Standing balance-Leahy Scale: Fair          Cognition Arousal: Alert Behavior During Therapy: WFL for tasks assessed/performed   PT - Cognitive impairments: No apparent impairments      Following commands: Intact         Exercises Total Joint Exercises Ankle Circles/Pumps: AROM, Both, 10 reps Quad Sets: AROM, 5 reps Gluteal Sets: AROM, 5 reps Hip ABduction/ADduction: 10 reps, AAROM Straight Leg Raises: AAROM, 5 reps    General Comments General comments (skin integrity, edema, etc.): reviewed exercises to promote strengthening and improve independnece      Pertinent Vitals/Pain Pain Assessment Pain Assessment: 0-10 Pain Score: 3  Faces Pain Scale: Hurts a little bit Pain Location: L Hip Pain Descriptors / Indicators: Discomfort, Sore Pain Intervention(s):  Limited activity within patient's tolerance, Monitored during session, Premedicated  before session     PT Goals (current goals can now be found in the care plan section) Acute Rehab PT Goals Patient Stated Goal: rehab then home Progress towards PT goals: Progressing toward goals    Frequency    7X/week       AM-PAC PT "6 Clicks" Mobility   Outcome Measure  Help needed turning from your back to your side while in a flat bed without using bedrails?: A Little Help needed moving from lying on your back to sitting on the side of a flat bed without using bedrails?: A Lot Help needed moving to and from a bed to a chair (including a wheelchair)?: A Little Help needed standing up from a chair using your arms (e.g., wheelchair or bedside chair)?: A Little Help needed to walk in hospital room?: A Little Help needed climbing 3-5 steps with a railing? : A Lot 6 Click Score: 16    End of Session   Activity Tolerance: Patient limited by fatigue;Patient limited by pain Patient left: in chair;with call bell/phone within reach;with chair alarm set Nurse Communication: Mobility status PT Visit Diagnosis: Unsteadiness on feet (R26.81);Other abnormalities of gait and mobility (R26.89);Repeated falls (R29.6);Muscle weakness (generalized) (M62.81);History of falling (Z91.81);Ataxic gait (R26.0);Difficulty in walking, not elsewhere classified (R26.2);Dizziness and giddiness (R42)     Time: 6295-2841 PT Time Calculation (min) (ACUTE ONLY): 13 min  Charges:    $Therapeutic Activity: 8-22 mins PT General Charges $$ ACUTE PT VISIT: 1 Visit                     Jetta Lout PTA 07/25/23, 1:11 PM

## 2023-07-25 NOTE — Plan of Care (Signed)

## 2023-07-25 NOTE — TOC Progression Note (Signed)
 Transition of Care [facility]) - Progression Note    Patient Details  Name: Sandra [name] MRN: [id number] Date of Birth: Jun 28, 1945  Transition of Care [facility]) CM/SW Contact  Marlowe Sax, RN Phone Number: 07/25/2023, 2:42 PM  Clinical Narrative:     Spoke with daughter [name] and let her know that at this time we only have [facility] rehab as an offer, she requested that I reach out to other faciliotes outside of the [facility], I expanded the [employer]       Expected Discharge Plan and Services                                               Social Determinants of Health (SDOH) Interventions SDOH Screenings   Food Insecurity: No Food Insecurity (07/20/2023)  Housing: Low Risk  (07/20/2023)  Transportation Needs: Unmet Transportation Needs (07/20/2023)  Utilities: At Risk (07/20/2023)  Alcohol Screen: Low Risk  (06/22/2021)  Depression (PHQ2-9): Low Risk  (06/22/2021)  Financial Resource Strain: Low Risk  (05/18/2021)  Physical Activity: Insufficiently Active (05/18/2021)  Social Connections: Socially Integrated (07/20/2023)  Stress: No Stress Concern Present (05/18/2021)  Tobacco Use: Low Risk  (07/20/2023)    Readmission Risk Interventions     No data to display

## 2023-07-25 NOTE — Progress Notes (Signed)
 Nutrition Follow-up  DOCUMENTATION CODES:   Not applicable  INTERVENTION:   -Continue regular diet -Continue Ensure Enlive po BID, each supplement provides 350 kcal and 20 grams of protein -MVI with minerals daily  NUTRITION DIAGNOSIS:   Increased nutrient needs related to post-op healing as evidenced by estimated needs.  Ongoing  GOAL:   Patient will meet greater than or equal to 90% of their needs  Progressing   MONITOR:   PO intake  REASON FOR ASSESSMENT:   Consult Assessment of nutrition requirement/status  ASSESSMENT:   78 y.o. F, presented to ED with complaints of left side pain after a fall while at a restaurant. Admitting with left hip fracture secondary to mechanical fall.  PMH; HFpEF, paroxysmal atrial fibrillation (used to be on Eliquis and having after having ICH it was stopped and is currently having watchman's device and loop recorder), GERD, anxiety, osteoarthritis, depression, essential hypertension, hypothyroidism, nonobstructive coronary artery disease and prediabetes, RLS and CVA with residual problems with balance.  2/22- s/p  Left   hip hemiarthroplasty with [employer] Accolade prosthesis   Reviewed I/O's: +360 ml x 24 hours and -1.1 L since admission   Spoke with pt at bedside, who reports feeling better today. Per pt, her appetite has improved over the past few days. Pt shares that her appetite was decreased post-op secondary to constipation, however, has improved since she had a bowel movement yesterday. Noted meal completions 70-100%.   Per pt, she usually has 2 meals per day PTA. She generally consumes a meat, starch, and vegetable and coffee in the morning. Pt eats out often and usually shares an entree with her husband secondary to large portions. Pt reports she does not follow a gluten free diet even though she has celiac disease.   Per pt, she has been losing weight intentionally. She reports her UBW is around 137#. Wt has been stable over the  past 6 months.   Discussed importance of good meal and supplement intake to promote healing. Pt amenable to continue supplements.   Medications reviewed and include calicium-vitamin D, lovenox, protonix, miralax, and senokot.   Labs reviewed.   NUTRITION - FOCUSED PHYSICAL EXAM:  Flowsheet Row Most Recent Value  Orbital Region No depletion  Upper Arm Region Mild depletion  Thoracic and Lumbar Region No depletion  Buccal Region No depletion  Temple Region No depletion  Clavicle Bone Region No depletion  Clavicle and Acromion Bone Region No depletion  Scapular Bone Region No depletion  Dorsal Hand No depletion  Patellar Region Mild depletion  Anterior Thigh Region Mild depletion  Posterior Calf Region Mild depletion  Edema (RD Assessment) None  Hair Reviewed  Eyes Reviewed  Mouth Reviewed  Skin Reviewed  Nails Reviewed       Diet Order:   Diet Order             Diet regular Room service appropriate? Yes; Fluid consistency: Thin  Diet effective now                   EDUCATION NEEDS:   Education needs have been addressed  Skin:  Skin Assessment: Skin Integrity Issues: Skin Integrity Issues:: Incisions Incisions: closed lt hip  Last BM:  07/25/23 (type 6)  Height:   Ht Readings from Last 1 Encounters:  07/20/23 5\' 3"  (1.6 m)    Weight:   Wt Readings from Last 1 Encounters:  07/20/23 61.1 kg    Ideal Body Weight:  52.3 kg  BMI:  Body  mass index is 23.86 kg/m.  Estimated Nutritional Needs:   Kcal:  1650-1850  Protein:  85-100 grams  Fluid:  > 1.6 L    Levada Schilling, RD, LDN, CDCES Registered Dietitian III Certified Diabetes Care and Education Specialist If unable to reach this RD, please use "RD Inpatient" group chat on secure chat between hours of 8am-4 pm daily

## 2023-07-25 NOTE — Progress Notes (Signed)
 Subjective: 4 Days Post-Op Procedure(s) (LRB): ARTHROPLASTY BIPOLAR HIP (HEMIARTHROPLASTY) (Left) Patient is out of bed in a chair and doing very well.  Her granddaughter is present today.  Her color is better.  Hemoglobin is the 9.3 after transfusion yesterday.  She has minimal pain.  She is walking well with PT.  She will need short-term skilled nursing rehab.  Patient reports pain as mild.  Objective:   VITALS:   Vitals:   07/24/23 2141 07/25/23 0727  BP: (!) 132/58 (!) 152/63  Pulse: 67 65  Resp: 18 16  Temp: 98.1 F (36.7 C) 97.9 F (36.6 C)  SpO2: 94% 96%    Neurologically intact Incision: dressing C/D/I  LABS Recent Labs    07/23/23 0407 07/24/23 0304 07/25/23 0944  HGB 8.9* 8.3* 9.3*  HCT 27.1* 25.6* 28.7*  WBC 7.3 6.2 5.5  PLT 219 197 263    Recent Labs    07/23/23 0407 07/24/23 0304 07/25/23 0944  NA 136 135 138  K 4.1 4.3 4.4  BUN 19 23 16   CREATININE 0.83 0.93 0.76  GLUCOSE 116* 112* 117*    No results for input(s): "LABPT", "INR" in the last 72 hours.   Assessment/Plan: 4 Days Post-Op Procedure(s) (LRB): ARTHROPLASTY BIPOLAR HIP (HEMIARTHROPLASTY) (Left)   Advance diet Up with therapy Discharge to SNF when bed available. 81 mg ASA twice daily for 6 weeks on discharge Return to office in 2 weeks after discharge for exam and x-rays

## 2023-07-25 NOTE — Progress Notes (Signed)
 PROGRESS NOTE    Sandra [name]  ZOX:[id number] DOB: 1945/06/19 DOA: 07/20/2023 PCP: Louis Matte, MD    Brief Narrative:  78 y.o. Caucasian female with medical history significant for HFpEF, paroxysmal atrial fibrillation (used to be on Eliquis and having after having ICH it was stopped and is currently having watchman's device and loop recorder), GERD, anxiety, osteoarthritis, depression, essential hypertension, hypothyroidism, nonobstructive coronary artery disease and prediabetes, RLS and CVA with residual problems with balance, who presented to the emergency room with acute onset of fall at a restaurant when she was trying to get up and lost her balance falling on the left side.  She denied any presyncope or syncope.  No headache or dizziness or blurred vision.  No new paresthesias or focal muscle weakness.  No chest pain or palpitations.  No recent cough or wheezing or dyspnea.  No nausea or vomiting or abdominal pain.  No dysuria, oliguria or hematuria or flank pain.  No fever or chills.     Assessment & Plan:   Principal Problem:   Closed left hip fracture, initial encounter [facility]) Active Problems:   Essential hypertension   Dyslipidemia   Hypothyroidism   Paroxysmal atrial fibrillation (HCC)   GERD without esophagitis   Anxiety and depression   Chronic diastolic CHF (congestive heart failure) (HCC)  Closed left hip fracture Mechanical fall No evidence of syncopal event Orthopedics consulted from ED Status post left hip hemiarthroplasty on 2/22 Plan: Continue therapy efforts DVT prophylaxis, subcu Lovenox x 14 days Pain control Will need skilled nursing facility BM reported today   Essential hypertension Orthostatic hypotension Patient with symptomatic orthostatic drop upon position change Plan: Hold home losartan Continue orthostatics Bilateral TED hose Encourage p.o. fluid intake Avoid IV fluids Albumin bolus 25 g 25% x 1 Consider abd binder if  mobility efforts are hampered by orthostasis  Hypoxia Likely secondary to mild fluid overload.  Received Lasix doses on 2/23 and 2/24 Urine appears quite concentrated Will not redose Lasix at this time Encourage p.o. fluid intake Avoid IV fluids given propensity to cause pulmonary edema  Severe iron deficiency anemia Patient on oral iron replacement however iron stores remain markedly low with low saturation ratios Plan: Hold home iron for now IV Venofer 300 mg daily x 3 doses B12 level 374, keep > 400, started oral supplement.  Dyslipidemia Continue home regimen   Hypothyroidism PTA Synthroid   Chronic diastolic CHF (congestive heart failure) (HCC) No evidence of exacerbation ARB held in setting of orthostatic hypotension   Anxiety and depression PTA Xanax and Zoloft   GERD without esophagitis PPI   Paroxysmal atrial fibrillation (HCC) Amiodarone    DVT prophylaxis: SQ Lovenox Code Status: Full Family Communication:Daughter at bedside 2/22, via phone on 2/23, at bedside 2/24 Disposition Plan: Status is: Inpatient Remains inpatient appropriate because: hip fracture POD# 3   Level of care: Med-Surg  Consultants:  Orthopedics  Procedures:  Left hip hemiarthroplasty 2/22  Antimicrobials: Ancef, perioperative   Subjective: Seen and examined.  Sitting up in chair.  Overall, feels well.  Weaned to room air. Pain is 3/10 at rest, it hurts more on movement and on ambulation Overall feeling improvement, no any significant complaints.  Awaiting for SNF placement  Objective: Vitals:   07/24/23 1615 07/24/23 2141 07/25/23 0727 07/25/23 1638  BP: (!) 146/73 (!) 132/58 (!) 152/63 (!) 152/66  Pulse: 64 67 65 65  Resp: 16 18 16 16   Temp: 98.1 F (36.7 C) 98.1 F (36.7 C)  97.9 F (36.6 C) 98.1 F (36.7 C)  TempSrc:  Oral Oral Oral  SpO2: 97% 94% 96% 95%  Weight:      Height:        Intake/Output Summary (Last 24 hours) at 07/25/2023 1651 Last data filed at  07/25/2023 1341 Gross per 24 hour  Intake 625 ml  Output --  Net 625 ml   Filed Weights   07/20/23 2037  Weight: 61.1 kg    Examination:  General exam: NAD Respiratory system: Lungs clear.  Normal work of breathing.  Room air Cardiovascular system: S1-S2, RRR, no murmur, no pedal edema Gastrointestinal system: Soft, NTND, normal BS Central nervous system: Alert and oriented. No focal neurological deficits. Extremities: left hip dressing CDI, gait not assessed Skin: No rashes, lesions or ulcers, appears pale Psychiatry: Judgement and insight appear normal. Mood & affect appropriate.     Data Reviewed: I have personally reviewed following labs and imaging studies  CBC: Recent Labs  Lab 07/21/23 0333 07/22/23 0246 07/23/23 0407 07/24/23 0304 07/25/23 0944  WBC 6.6 11.1* 7.3 6.2 5.5  HGB 10.2* 9.2* 8.9* 8.3* 9.3*  HCT 31.5* 28.6* 27.1* 25.6* 28.7*  MCV 93.5 93.5 92.8 92.8 92.6  PLT 222 223 219 197 263   Basic Metabolic Panel: Recent Labs  Lab 07/21/23 0333 07/22/23 0246 07/23/23 0407 07/24/23 0304 07/25/23 0944  NA 137 133* 136 135 138  K 4.5 4.8 4.1 4.3 4.4  CL 105 104 103 101 102  CO2 24 23 26 27 27   GLUCOSE 124* 158* 116* 112* 117*  BUN 18 14 19 23 16   CREATININE 0.90 0.84 0.83 0.93 0.76  CALCIUM 8.4* 8.6* 8.4* 8.2* 8.9  MG  --   --   --   --  2.3  PHOS  --   --   --   --  3.8   GFR: Estimated Creatinine Clearance: 47.9 mL/min (by C-G formula based on SCr of 0.76 mg/dL). Liver Function Tests: No results for input(s): "AST", "ALT", "ALKPHOS", "BILITOT", "PROT", "ALBUMIN" in the last 168 hours. No results for input(s): "LIPASE", "AMYLASE" in the last 168 hours. No results for input(s): "AMMONIA" in the last 168 hours. Coagulation Profile: Recent Labs  Lab 07/20/23 1848  INR 1.1   Cardiac Enzymes: No results for input(s): "CKTOTAL", "CKMB", "CKMBINDEX", "TROPONINI" in the last 168 hours. BNP (last 3 results) No results for input(s): "PROBNP" in the  last 8760 hours. HbA1C: No results for input(s): "HGBA1C" in the last 72 hours. CBG: No results for input(s): "GLUCAP" in the last 168 hours. Lipid Profile: No results for input(s): "CHOL", "HDL", "LDLCALC", "TRIG", "CHOLHDL", "LDLDIRECT" in the last 72 hours. Thyroid Function Tests: No results for input(s): "TSH", "T4TOTAL", "FREET4", "T3FREE", "THYROIDAB" in the last 72 hours. Anemia Panel: Recent Labs    07/24/23 0434 07/25/23 0944  VITAMINB12  --  372  FERRITIN 65  --   TIBC 248*  --   IRON 13*  --    Sepsis Labs: No results for input(s): "PROCALCITON", "LATICACIDVEN" in the last 168 hours.  Recent Results (from the past 240 hours)  Surgical PCR screen     Status: None   Collection Time: 07/20/23 10:05 PM   Specimen: Nasal Mucosa; Nasal Swab  Result Value Ref Range Status   MRSA, PCR NEGATIVE NEGATIVE Final   Staphylococcus aureus NEGATIVE NEGATIVE Final    Comment: (NOTE) The Xpert SA Assay (FDA approved for NASAL specimens in patients 73 years of age and older), is one  component of a comprehensive surveillance program. It is not intended to diagnose infection nor to guide or monitor treatment. Performed at [facility], [address], [place], [place] [address]          Radiology Studies: No results found.       Scheduled Meds:  ALPRAZolam  0.25 mg Oral QHS   amiodarone  100 mg Oral Daily   atorvastatin  40 mg Oral Daily   brimonidine  1 drop Both Eyes TID   calcium-vitamin D  1 tablet Oral Daily   cholecalciferol  5,000 Units Oral Daily   enoxaparin (LOVENOX) injection  30 mg Subcutaneous Q24H   ketorolac  15 mg Intravenous Q8H   levothyroxine  100 mcg Oral Q0600   multivitamin with minerals  1 tablet Oral Q1500   pantoprazole  40 mg Oral Daily   polyethylene glycol  17 g Oral Daily   senna-docusate  1 tablet Oral BID   simethicone  80 mg Oral QID   Continuous Infusions:      LOS: 5 days     Gillis Santa, MD [facility]   If 7PM-7AM, please contact night-coverage  07/25/2023, 4:51 PM

## 2023-07-26 DIAGNOSIS — S72002A Fracture of unspecified part of neck of left femur, initial encounter for closed fracture: Secondary | ICD-10-CM | POA: Diagnosis not present

## 2023-07-26 LAB — MAGNESIUM: Magnesium: 2.2 mg/dL (ref 1.7–2.4)

## 2023-07-26 LAB — CBC
HCT: 25.3 % — ABNORMAL LOW (ref 36.0–46.0)
Hemoglobin: 8.4 g/dL — ABNORMAL LOW (ref 12.0–15.0)
MCH: 30.5 pg (ref 26.0–34.0)
MCHC: 33.2 g/dL (ref 30.0–36.0)
MCV: 92 fL (ref 80.0–100.0)
Platelets: 235 10*3/uL (ref 150–400)
RBC: 2.75 MIL/uL — ABNORMAL LOW (ref 3.87–5.11)
RDW: 13.5 % (ref 11.5–15.5)
WBC: 4.4 10*3/uL (ref 4.0–10.5)
nRBC: 0.5 % — ABNORMAL HIGH (ref 0.0–0.2)

## 2023-07-26 LAB — BASIC METABOLIC PANEL
Anion gap: 8 (ref 5–15)
BUN: 17 mg/dL (ref 8–23)
CO2: 26 mmol/L (ref 22–32)
Calcium: 8.5 mg/dL — ABNORMAL LOW (ref 8.9–10.3)
Chloride: 104 mmol/L (ref 98–111)
Creatinine, Ser: 0.75 mg/dL (ref 0.44–1.00)
GFR, Estimated: >60 mL/min (ref >60)
Glucose, Bld: 100 mg/dL — ABNORMAL HIGH (ref 70–99)
Potassium: 4.1 mmol/L (ref 3.5–5.1)
Sodium: 138 mmol/L (ref 135–145)

## 2023-07-26 LAB — PHOSPHORUS: Phosphorus: 4 mg/dL (ref 2.5–4.6)

## 2023-07-26 MED ORDER — LOSARTAN POTASSIUM 25 MG PO TABS
25.0000 mg | ORAL_TABLET | Freq: Every day | ORAL | Status: DC
  Administered 2023-07-26 – 2023-07-27 (×2): 25 mg via ORAL
  Filled 2023-07-26 (×2): qty 1

## 2023-07-26 MED ORDER — ASPIRIN 81 MG PO TBEC
81.0000 mg | DELAYED_RELEASE_TABLET | Freq: Two times a day (BID) | ORAL | Status: DC
  Administered 2023-07-26 – 2023-07-30 (×8): 81 mg via ORAL
  Filled 2023-07-26 (×8): qty 1

## 2023-07-26 NOTE — Progress Notes (Addendum)
 Subjective: 5 Days Post-Op Procedure(s) (LRB): ARTHROPLASTY BIPOLAR HIP (HEMIARTHROPLASTY) (Left) Patient is alert and oriented sitting up in a chair.  Her dressing is dry.  Hemoglobin is 8.3.  Hip motion is good and stable.  Social work is working on a rehab bed.  She is walking with PT better.  I will discontinue the Lovenox and start her on 81 mg ASA twice daily.  Should help with bleeding if there is any.  Patient reports pain as mild.  Objective:   VITALS:   Vitals:   07/26/23 0741 07/26/23 1554  BP: (!) 164/62 (!) 163/64  Pulse: 62 64  Resp: 16 16  Temp: 97.9 F (36.6 C) 98.1 F (36.7 C)  SpO2: 98% 97%    Neurologically intact Incision: dressing C/D/I  LABS Recent Labs    07/24/23 0304 07/25/23 0944 07/26/23 0519  HGB 8.3* 9.3* 8.4*  HCT 25.6* 28.7* 25.3*  WBC 6.2 5.5 4.4  PLT 197 263 235    Recent Labs    07/24/23 0304 07/25/23 0944 07/26/23 0519  NA 135 138 138  K 4.3 4.4 4.1  BUN 23 16 17   CREATININE 0.93 0.76 0.75  GLUCOSE 112* 117* 100*    No results for input(s): "LABPT", "INR" in the last 72 hours.   Assessment/Plan: 5 Days Post-Op Procedure(s) (LRB): ARTHROPLASTY BIPOLAR HIP (HEMIARTHROPLASTY) (Left)   Advance diet Up with therapy Discharge to SNF in the next day or 2. Follow-up in office in 2 weeks. 81 mg ASA twice daily for 6 weeks.

## 2023-07-26 NOTE — Progress Notes (Signed)
 [facility] Summary Report / Loop Recorder

## 2023-07-26 NOTE — Progress Notes (Signed)
 PROGRESS NOTE    Sandra [name]  GEX:[id number] DOB: [birth year] DOA: 07/20/2023 PCP: Louis Matte, MD    Brief Narrative:  78 y.o. Caucasian female with medical history significant for HFpEF, paroxysmal atrial fibrillation (used to be on Eliquis and having after having ICH it was stopped and is currently having watchman's device and loop recorder), GERD, anxiety, osteoarthritis, depression, essential hypertension, hypothyroidism, nonobstructive coronary artery disease and prediabetes, RLS and CVA with residual problems with balance, who presented to the emergency room with acute onset of fall at a restaurant when she was trying to get up and lost her balance falling on the left side.  She denied any presyncope or syncope.  No headache or dizziness or blurred vision.  No new paresthesias or focal muscle weakness.  No chest pain or palpitations.  No recent cough or wheezing or dyspnea.  No nausea or vomiting or abdominal pain.  No dysuria, oliguria or hematuria or flank pain.  No fever or chills.     Assessment & Plan:   Principal Problem:   Closed left hip fracture, initial encounter [facility]) Active Problems:   Essential hypertension   Dyslipidemia   Hypothyroidism   Paroxysmal atrial fibrillation (HCC)   GERD without esophagitis   Anxiety and depression   Chronic diastolic CHF (congestive heart failure) (HCC)  Closed left hip fracture Mechanical fall No evidence of syncopal event Orthopedics consulted from ED Status post left hip hemiarthroplasty on 2/22 Plan: Continue therapy efforts DVT prophylaxis, subcu Lovenox x 14 days Pain control Will need skilled nursing facility BM reported today   Essential hypertension Orthostatic hypotension Patient with symptomatic orthostatic drop upon position change Plan: 2/27 Resume Losartan 25 mg po daily  Continue orthostatics Bilateral TED hose Encourage p.o. fluid intake Avoid IV fluids Albumin bolus 25 g 25% x 1 Consider  abd binder if mobility efforts are hampered by orthostasis  Hypoxia Likely secondary to mild fluid overload.  Received Lasix doses on 2/23 and 2/24 Urine appears quite concentrated Will not redose Lasix at this time Encourage p.o. fluid intake Avoid IV fluids given propensity to cause pulmonary edema  Severe iron deficiency anemia Patient on oral iron replacement however iron stores remain markedly low with low saturation ratios Plan: S/p IV Venofer 300 mg daily x 3 doses given  B12 level 374, keep > 400, started oral supplement. Resume oral iron supplement on discharge  Dyslipidemia Continue home regimen   Hypothyroidism PTA Synthroid   Chronic diastolic CHF (congestive heart failure) No evidence of exacerbation ARB held in setting of orthostatic hypotension   Anxiety and depression PTA Xanax and Zoloft   GERD without esophagitis PPI   Paroxysmal atrial fibrillation  Amiodarone Heart rate is under control, slightly bradycardiac Patient is not on BB/CCB    DVT prophylaxis: SQ Lovenox Code Status: Full Family Communication:Daughter at bedside 2/22, via phone on 2/23, at bedside 2/24 Disposition Plan: Status is: Inpatient Remains inpatient appropriate because: hip fracture POD# 3   Level of care: Med-Surg  Consultants:  Orthopedics  Procedures:  Left hip hemiarthroplasty 2/22  Antimicrobials: Ancef, perioperative   Subjective: Patient was seen and examined bedside, laying comfortably.  Denied any complaints.  Pain is under control.  Patient only took 1 dose of Tylenol, stated that she is doing much better now.  Objective: Vitals:   07/25/23 1638 07/25/23 2335 07/26/23 0741 07/26/23 1554  BP: (!) 152/66 130/60 (!) 164/62 (!) 163/64  Pulse: 65 60 62 64  Resp: 16 19 16  16  Temp: 98.1 F (36.7 C) (!) 97.5 F (36.4 C) 97.9 F (36.6 C) 98.1 F (36.7 C)  TempSrc: Oral Oral  Oral  SpO2: 95% 95% 98% 97%  Weight:      Height:        Intake/Output  Summary (Last 24 hours) at 07/26/2023 1628 Last data filed at 07/26/2023 1305 Gross per 24 hour  Intake 265.05 ml  Output --  Net 265.05 ml   Filed Weights   07/20/23 2037  Weight: 61.1 kg    Examination:  General exam: NAD Respiratory system: Lungs clear.  Normal work of breathing.  Room air Cardiovascular system: S1-S2, RRR, no murmur, no pedal edema Gastrointestinal system: Soft, NTND, normal BS Central nervous system: Alert and oriented. No focal neurological deficits. Extremities: left hip dressing CDI, gait not assessed Skin: No rashes, lesions or ulcers, appears pale Psychiatry: Judgement and insight appear normal. Mood & affect appropriate.     Data Reviewed: I have personally reviewed following labs and imaging studies  CBC: Recent Labs  Lab 07/22/23 0246 07/23/23 0407 07/24/23 0304 07/25/23 0944 07/26/23 0519  WBC 11.1* 7.3 6.2 5.5 4.4  HGB 9.2* 8.9* 8.3* 9.3* 8.4*  HCT 28.6* 27.1* 25.6* 28.7* 25.3*  MCV 93.5 92.8 92.8 92.6 92.0  PLT 223 219 197 263 235   Basic Metabolic Panel: Recent Labs  Lab 07/22/23 0246 07/23/23 0407 07/24/23 0304 07/25/23 0944 07/26/23 0519  NA 133* 136 135 138 138  K 4.8 4.1 4.3 4.4 4.1  CL 104 103 101 102 104  CO2 23 26 27 27 26   GLUCOSE 158* 116* 112* 117* 100*  BUN 14 19 23 16 17   CREATININE 0.84 0.83 0.93 0.76 0.75  CALCIUM 8.6* 8.4* 8.2* 8.9 8.5*  MG  --   --   --  2.3 2.2  PHOS  --   --   --  3.8 4.0   GFR: Estimated Creatinine Clearance: 47.9 mL/min (by C-G formula based on SCr of 0.75 mg/dL). Liver Function Tests: No results for input(s): "AST", "ALT", "ALKPHOS", "BILITOT", "PROT", "ALBUMIN" in the last 168 hours. No results for input(s): "LIPASE", "AMYLASE" in the last 168 hours. No results for input(s): "AMMONIA" in the last 168 hours. Coagulation Profile: Recent Labs  Lab 07/20/23 1848  INR 1.1   Cardiac Enzymes: No results for input(s): "CKTOTAL", "CKMB", "CKMBINDEX", "TROPONINI" in the last 168  hours. BNP (last 3 results) No results for input(s): "PROBNP" in the last 8760 hours. HbA1C: No results for input(s): "HGBA1C" in the last 72 hours. CBG: No results for input(s): "GLUCAP" in the last 168 hours. Lipid Profile: No results for input(s): "CHOL", "HDL", "LDLCALC", "TRIG", "CHOLHDL", "LDLDIRECT" in the last 72 hours. Thyroid Function Tests: No results for input(s): "TSH", "T4TOTAL", "FREET4", "T3FREE", "THYROIDAB" in the last 72 hours. Anemia Panel: Recent Labs    07/24/23 0434 07/25/23 0944  VITAMINB12  --  372  FERRITIN 65  --   TIBC 248*  --   IRON 13*  --    Sepsis Labs: No results for input(s): "PROCALCITON", "LATICACIDVEN" in the last 168 hours.  Recent Results (from the past 240 hours)  Surgical PCR screen     Status: None   Collection Time: 07/20/23 10:05 PM   Specimen: Nasal Mucosa; Nasal Swab  Result Value Ref Range Status   MRSA, PCR NEGATIVE NEGATIVE Final   Staphylococcus aureus NEGATIVE NEGATIVE Final    Comment: (NOTE) The Xpert SA Assay (FDA approved for NASAL specimens in patients 22 years  of age and older), is one component of a comprehensive surveillance program. It is not intended to diagnose infection nor to guide or monitor treatment. Performed at [facility], [address], [place], [place] [address]          Radiology Studies: No results found.       Scheduled Meds:  ALPRAZolam  0.25 mg Oral QHS   amiodarone  100 mg Oral Daily   atorvastatin  40 mg Oral Daily   brimonidine  1 drop Both Eyes TID   calcium-vitamin D  1 tablet Oral Daily   cholecalciferol  5,000 Units Oral Daily   vitamin B-12  500 mcg Oral Daily   enoxaparin (LOVENOX) injection  30 mg Subcutaneous Q24H   ketorolac  15 mg Intravenous Q8H   levothyroxine  100 mcg Oral Q0600   multivitamin with minerals  1 tablet Oral Q1500   pantoprazole  40 mg Oral Daily   polyethylene glycol  17 g Oral Daily   senna-docusate  1 tablet Oral BID    simethicone  80 mg Oral QID   Continuous Infusions:      LOS: 6 days     Gillis Santa, MD [facility]   If 7PM-7AM, please contact night-coverage  07/26/2023, 4:28 PM

## 2023-07-26 NOTE — TOC Progression Note (Signed)
 Transition of Care [facility], [place]) - Progression Note    Patient Details  Name: Sandra [name] MRN: [id number] Date of Birth: August 11, 1945  Transition of Care [facility]) CM/SW Contact  Marlowe Sax, RN Phone Number: 07/26/2023, 4:32 PM  Clinical Narrative:     Spoke with the patient, she would like to go to [employer], I contacted Tonya at [facility], she stated that the family came today to tour, I asked her if she was going to make a bed offer the patient would like to go to [facility] They accepted her I asked Tonya to get the Ins process started, she agreed       Expected Discharge Plan and Services                                               Social Determinants of Health (SDOH) Interventions SDOH Screenings   Food Insecurity: No Food Insecurity (07/20/2023)  Housing: Low Risk  (07/20/2023)  Transportation Needs: Unmet Transportation Needs (07/20/2023)  Utilities: At Risk (07/20/2023)  Alcohol Screen: Low Risk  (06/22/2021)  Depression (PHQ2-9): Low Risk  (06/22/2021)  Financial Resource Strain: Low Risk  (05/18/2021)  Physical Activity: Insufficiently Active (05/18/2021)  Social Connections: Socially Integrated (07/20/2023)  Stress: No Stress Concern Present (05/18/2021)  Tobacco Use: Low Risk  (07/20/2023)    Readmission Risk Interventions     No data to display

## 2023-07-26 NOTE — Plan of Care (Signed)
  Problem: Clinical Measurements: Goal: Ability to maintain clinical measurements within normal limits will improve Outcome: Progressing   Problem: Clinical Measurements: Goal: Diagnostic test results will improve Outcome: Progressing   Problem: Activity: Goal: Risk for activity intolerance will decrease Outcome: Progressing   Problem: Pain Managment: Goal: General experience of comfort will improve and/or be controlled Outcome: Progressing   Problem: Safety: Goal: Ability to remain free from injury will improve Outcome: Progressing

## 2023-07-26 NOTE — Progress Notes (Signed)
 Physical Therapy Treatment Patient Details Name: Sandra [name] MRN: [id number] DOB: 1945-12-23 Today's Date: 07/26/2023   History of Present Illness 78 y.o. Caucasian female with medical history significant for HFpEF, paroxysmal atrial fibrillation (used to be on Eliquis and having after having ICH it was stopped and is currently having watchman's device and loop recorder), GERD, anxiety, osteoarthritis, depression, essential hypertension, hypothyroidism, nonobstructive coronary artery disease and prediabetes, RLS and CVA with residual problems with balance, who presented to the emergency room with acute onset of fall at a restaurant when she was trying to get up and lost her balance falling on the left side.  Pt is s/p L hemiarthroplasty on 07/21/23.    PT Comments  Pt was sitting in recliner upon arrival. Still requiring education on hip precautions and ways to adhere throughout. Pt did remember she is PWB(50%) and does well to adhere throughout session. Discussed possibility of DC home versus STR. Pt still does not feel safe to DC home at this time. She was able to safely stand and ambulate with close SBA. No LOB or safety concerns during gait but did require vcs for sequencing to improve PWB. Reviewed HEP and importance of continuing to perform throughout the day. Pt continues to progress well towards rehab goals. Will continue to follow and progress per current POC. Follow Mds recs for DC disposition.     If plan is discharge home, recommend the following: A little help with walking and/or transfers;A little help with bathing/dressing/bathroom;Assistance with cooking/housework;Direct supervision/assist for medications management;Direct supervision/assist for financial management;Assist for transportation;Help with stairs or ramp for entrance     Equipment Recommendations  Other (comment) (defer to next level of care)       Precautions / Restrictions Precautions Precautions: Posterior  Hip Precaution Booklet Issued: Yes (comment) Recall of Precautions/Restrictions: Impaired Precaution/Restrictions Comments: recalled 2 of 3 Restrictions Weight Bearing Restrictions Per Provider Order: Yes LLE Weight Bearing Per Provider Order: Partial weight bearing LLE Partial Weight Bearing Percentage or Pounds: 50%     Mobility  Bed Mobility  General bed mobility comments: pt was in recliner pre/post session    Transfers Overall transfer level: Needs assistance Equipment used: Rolling walker (2 wheels) Transfers: Sit to/from Stand Sit to Stand: Contact guard assist  General transfer comment: CGA for safety with vcs for technique improvements to adhere to 50 % PWB    Ambulation/Gait Ambulation/Gait assistance: Supervision, Contact guard assist Gait Distance (Feet): 200 Feet Assistive device: Rolling walker (2 wheels) Gait Pattern/deviations: Step-through pattern, Antalgic, Trunk flexed Gait velocity: Decreased  General Gait Details: pt was able to ambulate ~ 200 ft however slow antalgic gait overall. pt was cued for improved UE wt bearing to adhere to [facility] restrictions      Balance Overall balance assessment: Needs assistance Sitting-balance support: No upper extremity supported, Feet supported Sitting balance-Leahy Scale: Good     Standing balance support: Bilateral upper extremity supported, During functional activity, Reliant on assistive device for balance Standing balance-Leahy Scale: Fair      Hotel manager: No apparent difficulties  Cognition Arousal: Alert Behavior During Therapy: WFL for tasks assessed/performed   PT - Cognitive impairments: No apparent impairments  PT - Cognition Comments: pt is A and O x 4 but does have some cognition deficits come to light throughout session. cognition does not affect session progression. Following commands: Intact      Cueing Cueing Techniques: Verbal cues     General Comments General  comments (skin integrity, edema, etc.): reviewed  importance of HEP performance and routine mobility to promote strengthening and healing      Pertinent Vitals/Pain Pain Assessment Pain Assessment: 0-10 Pain Score: 3  Pain Location: L Hip Pain Descriptors / Indicators: Discomfort, Sore Pain Intervention(s): Limited activity within patient's tolerance, Monitored during session, Premedicated before session, Repositioned     PT Goals (current goals can now be found in the care plan section) Acute Rehab PT Goals Patient Stated Goal: rehab then home Progress towards PT goals: Progressing toward goals    Frequency    7X/week       AM-PAC PT "6 Clicks" Mobility   Outcome Measure  Help needed turning from your back to your side while in a flat bed without using bedrails?: A Little Help needed moving from lying on your back to sitting on the side of a flat bed without using bedrails?: A Little Help needed moving to and from a bed to a chair (including a wheelchair)?: A Little Help needed standing up from a chair using your arms (e.g., wheelchair or bedside chair)?: A Little Help needed to walk in hospital room?: A Little Help needed climbing 3-5 steps with a railing? : A Little 6 Click Score: 18    End of Session   Activity Tolerance: Patient tolerated treatment well Patient left: in chair;with call bell/phone within reach;with chair alarm set;with family/visitor present Nurse Communication: Mobility status PT Visit Diagnosis: Unsteadiness on feet (R26.81);Other abnormalities of gait and mobility (R26.89);Repeated falls (R29.6);Muscle weakness (generalized) (M62.81);History of falling (Z91.81);Ataxic gait (R26.0);Difficulty in walking, not elsewhere classified (R26.2);Dizziness and giddiness (R42)     Time: 4782-9562 PT Time Calculation (min) (ACUTE ONLY): 16 min  Charges:    $Gait Training: 8-22 mins PT General Charges $$ ACUTE PT VISIT: 1 Visit                    Jetta Lout PTA 07/26/23, 12:04 PM

## 2023-07-27 DIAGNOSIS — S72002A Fracture of unspecified part of neck of left femur, initial encounter for closed fracture: Secondary | ICD-10-CM | POA: Diagnosis not present

## 2023-07-27 LAB — BASIC METABOLIC PANEL
Anion gap: 8 (ref 5–15)
BUN: 14 mg/dL (ref 8–23)
CO2: 25 mmol/L (ref 22–32)
Calcium: 8.7 mg/dL — ABNORMAL LOW (ref 8.9–10.3)
Chloride: 106 mmol/L (ref 98–111)
Creatinine, Ser: 0.68 mg/dL (ref 0.44–1.00)
GFR, Estimated: >60 mL/min (ref >60)
Glucose, Bld: 103 mg/dL — ABNORMAL HIGH (ref 70–99)
Potassium: 3.7 mmol/L (ref 3.5–5.1)
Sodium: 139 mmol/L (ref 135–145)

## 2023-07-27 LAB — CBC
HCT: 28.5 % — ABNORMAL LOW (ref 36.0–46.0)
Hemoglobin: 9.2 g/dL — ABNORMAL LOW (ref 12.0–15.0)
MCH: 30.3 pg (ref 26.0–34.0)
MCHC: 32.3 g/dL (ref 30.0–36.0)
MCV: 93.8 fL (ref 80.0–100.0)
Platelets: 280 10*3/uL (ref 150–400)
RBC: 3.04 MIL/uL — ABNORMAL LOW (ref 3.87–5.11)
RDW: 13.7 % (ref 11.5–15.5)
WBC: 5.6 10*3/uL (ref 4.0–10.5)
nRBC: 0.5 % — ABNORMAL HIGH (ref 0.0–0.2)

## 2023-07-27 LAB — MAGNESIUM: Magnesium: 2.2 mg/dL (ref 1.7–2.4)

## 2023-07-27 LAB — PHOSPHORUS: Phosphorus: 3.5 mg/dL (ref 2.5–4.6)

## 2023-07-27 MED ORDER — LOSARTAN POTASSIUM 50 MG PO TABS
50.0000 mg | ORAL_TABLET | Freq: Every day | ORAL | Status: DC
  Administered 2023-07-28 – 2023-07-30 (×3): 50 mg via ORAL
  Filled 2023-07-27 (×3): qty 1

## 2023-07-27 NOTE — Plan of Care (Signed)
  Problem: Education: Goal: Knowledge of General Education information will improve Description: Including pain rating scale, medication(s)/side effects and non-pharmacologic comfort measures Outcome: Progressing   Problem: Clinical Measurements: Goal: Diagnostic test results will improve Outcome: Progressing   Problem: Activity: Goal: Risk for activity intolerance will decrease Outcome: Progressing   Problem: Coping: Goal: Level of anxiety will decrease Outcome: Progressing   Problem: Elimination: Goal: Will not experience complications related to bowel motility Outcome: Progressing Goal: Will not experience complications related to urinary retention Outcome: Progressing   Problem: Education: Goal: Verbalization of understanding the information provided (i.e., activity precautions, restrictions, etc) will improve Outcome: Progressing   Problem: Activity: Goal: Ability to ambulate and perform ADLs will improve Outcome: Progressing   Problem: Pain Management: Goal: Pain level will decrease Outcome: Progressing

## 2023-07-27 NOTE — Progress Notes (Signed)
 PROGRESS NOTE    Sandra [name]  KGM:[id number] DOB: 1946-04-18 DOA: 07/20/2023 PCP: Louis Matte, MD    Brief Narrative:  78 y.o. Caucasian female with medical history significant for HFpEF, paroxysmal atrial fibrillation (used to be on Eliquis and having after having ICH it was stopped and is currently having watchman's device and loop recorder), GERD, anxiety, osteoarthritis, depression, essential hypertension, hypothyroidism, nonobstructive coronary artery disease and prediabetes, RLS and CVA with residual problems with balance, who presented to the emergency room with acute onset of fall at a restaurant when she was trying to get up and lost her balance falling on the left side.  She denied any presyncope or syncope.  No headache or dizziness or blurred vision.  No new paresthesias or focal muscle weakness.  No chest pain or palpitations.  No recent cough or wheezing or dyspnea.  No nausea or vomiting or abdominal pain.  No dysuria, oliguria or hematuria or flank pain.  No fever or chills.     Assessment & Plan:   Principal Problem:   Closed left hip fracture, initial encounter [facility]) Active Problems:   Essential hypertension   Dyslipidemia   Hypothyroidism   Paroxysmal atrial fibrillation (HCC)   GERD without esophagitis   Anxiety and depression   Chronic diastolic CHF (congestive heart failure) (HCC)  Closed left hip fracture Mechanical fall No evidence of syncopal event Orthopedics consulted from ED Status post left hip hemiarthroplasty on 2/22 Plan: Continue therapy efforts DVT prophylaxis, subcu Lovenox x 14 days Pain control Will need skilled nursing facility BM reported today   Essential hypertension Orthostatic hypotension Patient with symptomatic orthostatic drop upon position change Plan: 2/27 Resume Losartan 25 mg po daily  From 3/1 increased losartan 50 mg p.o. daily Continue orthostatics Bilateral TED hose Encourage p.o. fluid intake Avoid IV  fluids Albumin bolus 25 g 25% x 1 Consider abd binder if mobility efforts are hampered by orthostasis  Acute hypoxic respiratory failure, resolved  Likely secondary to mild fluid overload.  Received Lasix doses on 2/23 and 2/24 Urine appears quite concentrated Will not redose Lasix at this time Encourage p.o. fluid intake Avoid IV fluids given propensity to cause pulmonary edema  Severe iron deficiency anemia Patient on oral iron replacement however iron stores remain markedly low with low saturation ratios Plan: S/p IV Venofer 300 mg daily x 3 doses given  B12 level 374, keep > 400, started oral supplement. Resume oral iron supplement on discharge  Dyslipidemia Continue home regimen   Hypothyroidism PTA Synthroid   Chronic diastolic CHF (congestive heart failure) No evidence of exacerbation ARB held in setting of orthostatic hypotension   Anxiety and depression PTA Xanax and Zoloft   GERD without esophagitis PPI   Paroxysmal atrial fibrillation  Not on DOAC due to ICH, s/p Watchman device Continue amiodarone Heart rate is under control, slightly bradycardiac Patient is not on BB/CCB    DVT prophylaxis: SQ Lovenox Code Status: Full Family Communication:Daughter at bedside 2/22, via phone on 2/23, at bedside 2/24 Disposition Plan: Status is: Inpatient Remains inpatient appropriate because: hip fracture POD# 3   Level of care: Med-Surg  Consultants:  Orthopedics  Procedures:  Left hip hemiarthroplasty 2/22  Antimicrobials: Ancef, perioperative   Subjective: Patient was seen and examined bedside, laying comfortably.  Left hip pain 4/10, patient was able to walk with a walker in the hallway with PT.  Denied any complaints.  Objective: Vitals:   07/26/23 6440 07/26/23 1554 07/26/23 2311 07/27/23 3474  BP: (!) 164/62 (!) 163/64 (!) 143/67 (!) 155/68  Pulse: 62 64 65 63  Resp: 16 16 18 16   Temp: 97.9 F (36.6 C) 98.1 F (36.7 C) 97.9 F (36.6 C) 98.6  F (37 C)  TempSrc:  Oral    SpO2: 98% 97% 96% 100%  Weight:      Height:        Intake/Output Summary (Last 24 hours) at 07/27/2023 1412 Last data filed at 07/27/2023 1047 Gross per 24 hour  Intake 480 ml  Output --  Net 480 ml   Filed Weights   07/20/23 2037  Weight: 61.1 kg    Examination:  General exam: NAD Respiratory system: Lungs clear.  Normal work of breathing.  Room air Cardiovascular system: S1-S2, RRR, no murmur, no pedal edema Gastrointestinal system: Soft, NTND, normal BS Central nervous system: Alert and oriented. No focal neurological deficits. Extremities: left hip dressing CDI, gait not assessed Skin: No rashes, lesions or ulcers, appears pale Psychiatry: Judgement and insight appear normal. Mood & affect appropriate.     Data Reviewed: I have personally reviewed following labs and imaging studies  CBC: Recent Labs  Lab 07/23/23 0407 07/24/23 0304 07/25/23 0944 07/26/23 0519 07/27/23 0532  WBC 7.3 6.2 5.5 4.4 5.6  HGB 8.9* 8.3* 9.3* 8.4* 9.2*  HCT 27.1* 25.6* 28.7* 25.3* 28.5*  MCV 92.8 92.8 92.6 92.0 93.8  PLT 219 197 263 235 280   Basic Metabolic Panel: Recent Labs  Lab 07/23/23 0407 07/24/23 0304 07/25/23 0944 07/26/23 0519 07/27/23 0532  NA 136 135 138 138 139  K 4.1 4.3 4.4 4.1 3.7  CL 103 101 102 104 106  CO2 26 27 27 26 25   GLUCOSE 116* 112* 117* 100* 103*  BUN 19 23 16 17 14   CREATININE 0.83 0.93 0.76 0.75 0.68  CALCIUM 8.4* 8.2* 8.9 8.5* 8.7*  MG  --   --  2.3 2.2 2.2  PHOS  --   --  3.8 4.0 3.5   GFR: Estimated Creatinine Clearance: 47.9 mL/min (by C-G formula based on SCr of 0.68 mg/dL). Liver Function Tests: No results for input(s): "AST", "ALT", "ALKPHOS", "BILITOT", "PROT", "ALBUMIN" in the last 168 hours. No results for input(s): "LIPASE", "AMYLASE" in the last 168 hours. No results for input(s): "AMMONIA" in the last 168 hours. Coagulation Profile: Recent Labs  Lab 07/20/23 1848  INR 1.1   Cardiac  Enzymes: No results for input(s): "CKTOTAL", "CKMB", "CKMBINDEX", "TROPONINI" in the last 168 hours. BNP (last 3 results) No results for input(s): "PROBNP" in the last 8760 hours. HbA1C: No results for input(s): "HGBA1C" in the last 72 hours. CBG: No results for input(s): "GLUCAP" in the last 168 hours. Lipid Profile: No results for input(s): "CHOL", "HDL", "LDLCALC", "TRIG", "CHOLHDL", "LDLDIRECT" in the last 72 hours. Thyroid Function Tests: No results for input(s): "TSH", "T4TOTAL", "FREET4", "T3FREE", "THYROIDAB" in the last 72 hours. Anemia Panel: Recent Labs    07/25/23 0944  VITAMINB12 372   Sepsis Labs: No results for input(s): "PROCALCITON", "LATICACIDVEN" in the last 168 hours.  Recent Results (from the past 240 hours)  Surgical PCR screen     Status: None   Collection Time: 07/20/23 10:05 PM   Specimen: Nasal Mucosa; Nasal Swab  Result Value Ref Range Status   MRSA, PCR NEGATIVE NEGATIVE Final   Staphylococcus aureus NEGATIVE NEGATIVE Final    Comment: (NOTE) The Xpert SA Assay (FDA approved for NASAL specimens in patients 28 years of age and older), is one  component of a comprehensive surveillance program. It is not intended to diagnose infection nor to guide or monitor treatment. Performed at [facility], [address], [place], [place] [address]       Radiology Studies: No results found.   Scheduled Meds:  ALPRAZolam  0.25 mg Oral QHS   amiodarone  100 mg Oral Daily   aspirin EC  81 mg Oral BID   atorvastatin  40 mg Oral Daily   brimonidine  1 drop Both Eyes TID   calcium-vitamin D  1 tablet Oral Daily   cholecalciferol  5,000 Units Oral Daily   vitamin B-12  500 mcg Oral Daily   levothyroxine  100 mcg Oral Q0600   [START ON 07/28/2023] losartan  50 mg Oral Daily   multivitamin with minerals  1 tablet Oral Q1500   pantoprazole  40 mg Oral Daily   polyethylene glycol  17 g Oral Daily   senna-docusate  1 tablet Oral BID   simethicone  80  mg Oral QID   Continuous Infusions:    LOS: 7 days    Gillis Santa, MD [facility]   If 7PM-7AM, please contact night-coverage  07/27/2023, 2:12 PM

## 2023-07-27 NOTE — Progress Notes (Signed)
 Physical Therapy Treatment Patient Details Name: Sandra [name] MRN: [id number] DOB: [birth year] Today's Date: 07/27/2023   History of Present Illness 78 y.o. Caucasian female with medical history significant for HFpEF, paroxysmal atrial fibrillation (used to be on Eliquis and having after having ICH it was stopped and is currently having watchman's device and loop recorder), GERD, anxiety, osteoarthritis, depression, essential hypertension, hypothyroidism, nonobstructive coronary artery disease and prediabetes, RLS and CVA with residual problems with balance, who presented to the emergency room with acute onset of fall at a restaurant when she was trying to get up and lost her balance falling on the left side.  Pt is s/p L hemiarthroplasty on 07/21/23.    PT Comments  Pt was long sitting in bed upon arrival. She is A and O x 3. Agreeable for OOB activity but does endorse more severe pain/soreness.  She still demonstrated safe abilities to exit bed, stand to RW, and tolerate gait > 200 ft. Overall pt is doing well and progressing quickly. She does still want STR at DC. Acute PT will continue to follow per current POC.     If plan is discharge home, recommend the following: A little help with walking and/or transfers;A little help with bathing/dressing/bathroom;Assistance with cooking/housework;Direct supervision/assist for medications management;Direct supervision/assist for financial management;Assist for transportation;Help with stairs or ramp for entrance     Equipment Recommendations  Other (comment) (Defer to next level of care)       Precautions / Restrictions Precautions Precautions: Posterior Hip Precaution Booklet Issued: Yes (comment) Recall of Precautions/Restrictions: Impaired Precaution/Restrictions Comments: recalled 2 of 3 Restrictions Weight Bearing Restrictions Per Provider Order: Yes LLE Weight Bearing Per Provider Order: Partial weight bearing LLE Partial Weight Bearing  Percentage or Pounds: 50     Mobility  Bed Mobility Overal bed mobility: Needs Assistance Bed Mobility: Supine to Sit, Sit to Supine  Supine to sit: Supervision Sit to supine: Min assist   Transfers Overall transfer level: Needs assistance Equipment used: Rolling walker (2 wheels) Transfers: Sit to/from Stand Sit to Stand: Supervision   Ambulation/Gait Ambulation/Gait assistance: Supervision Gait Distance (Feet): 200 Feet Assistive device: Rolling walker (2 wheels) Gait Pattern/deviations: Step-through pattern, Antalgic, Trunk flexed Gait velocity: Decreased  General Gait Details: Pt ambulated 200 ft with Rw. more antalgic but no LOB or safety concerns overall.    Balance Overall balance assessment: Needs assistance Sitting-balance support: No upper extremity supported, Feet supported Sitting balance-Leahy Scale: Good     Standing balance support: Bilateral upper extremity supported, During functional activity, Reliant on assistive device for balance Standing balance-Leahy Scale: Fair Standing balance comment: reliant on BUE support during dynamic standing activity      Communication Communication Communication: No apparent difficulties  Cognition Arousal: Alert Behavior During Therapy: WFL for tasks assessed/performed   PT - Cognitive impairments: No apparent impairments      PT - Cognition Comments: Pt is alert and O x3 but pt does have some slight STM deficits. pt extremely pleasant and cooperative throughout. Following commands: Intact      Cueing Cueing Techniques: Verbal cues         Pertinent Vitals/Pain Pain Assessment Pain Assessment: 0-10 Pain Location: L Hip Pain Descriptors / Indicators: Discomfort, Sore Pain Intervention(s): Limited activity within patient's tolerance, Monitored during session, Premedicated before session, Repositioned, Ice applied     PT Goals (current goals can now be found in the care plan section) Acute Rehab PT  Goals Patient Stated Goal: rehab then home Progress towards PT goals:  Progressing toward goals    Frequency    7X/week       AM-PAC PT "6 Clicks" Mobility   Outcome Measure  Help needed turning from your back to your side while in a flat bed without using bedrails?: A Little Help needed moving from lying on your back to sitting on the side of a flat bed without using bedrails?: A Little Help needed moving to and from a bed to a chair (including a wheelchair)?: A Little Help needed standing up from a chair using your arms (e.g., wheelchair or bedside chair)?: A Little Help needed to walk in hospital room?: A Little Help needed climbing 3-5 steps with a railing? : A Little 6 Click Score: 18    End of Session   Activity Tolerance: Patient tolerated treatment well Patient left: in bed;with call bell/phone within reach;with bed alarm set Nurse Communication: Mobility status PT Visit Diagnosis: Unsteadiness on feet (R26.81);Other abnormalities of gait and mobility (R26.89);Repeated falls (R29.6);Muscle weakness (generalized) (M62.81);History of falling (Z91.81);Ataxic gait (R26.0);Difficulty in walking, not elsewhere classified (R26.2);Dizziness and giddiness (R42)     Time: 4540-9811 PT Time Calculation (min) (ACUTE ONLY): 14 min  Charges:    $Gait Training: 8-22 mins PT General Charges $$ ACUTE PT VISIT: 1 Visit                     Jetta Lout PTA 07/27/23, 12:08 PM

## 2023-07-28 DIAGNOSIS — S72002A Fracture of unspecified part of neck of left femur, initial encounter for closed fracture: Secondary | ICD-10-CM | POA: Diagnosis not present

## 2023-07-28 LAB — MAGNESIUM: Magnesium: 2.1 mg/dL (ref 1.7–2.4)

## 2023-07-28 LAB — BASIC METABOLIC PANEL
Anion gap: 5 (ref 5–15)
BUN: 11 mg/dL (ref 8–23)
CO2: 25 mmol/L (ref 22–32)
Calcium: 8.7 mg/dL — ABNORMAL LOW (ref 8.9–10.3)
Chloride: 110 mmol/L (ref 98–111)
Creatinine, Ser: 0.6 mg/dL (ref 0.44–1.00)
GFR, Estimated: >60 mL/min (ref >60)
Glucose, Bld: 101 mg/dL — ABNORMAL HIGH (ref 70–99)
Potassium: 3.7 mmol/L (ref 3.5–5.1)
Sodium: 140 mmol/L (ref 135–145)

## 2023-07-28 LAB — CBC
HCT: 28.2 % — ABNORMAL LOW (ref 36.0–46.0)
Hemoglobin: 9.1 g/dL — ABNORMAL LOW (ref 12.0–15.0)
MCH: 30.1 pg (ref 26.0–34.0)
MCHC: 32.3 g/dL (ref 30.0–36.0)
MCV: 93.4 fL (ref 80.0–100.0)
Platelets: 270 10*3/uL (ref 150–400)
RBC: 3.02 MIL/uL — ABNORMAL LOW (ref 3.87–5.11)
RDW: 13.9 % (ref 11.5–15.5)
WBC: 5.3 10*3/uL (ref 4.0–10.5)
nRBC: 0 % (ref 0.0–0.2)

## 2023-07-28 LAB — PHOSPHORUS: Phosphorus: 3.6 mg/dL (ref 2.5–4.6)

## 2023-07-28 NOTE — Progress Notes (Signed)
 PROGRESS NOTE    Sandra [name]  WUJ:[id number] DOB: [birth year] DOA: 07/20/2023 PCP: Louis Matte, MD    Brief Narrative:  78 y.o. Caucasian female with medical history significant for HFpEF, paroxysmal atrial fibrillation (used to be on Eliquis and having after having ICH it was stopped and is currently having watchman's device and loop recorder), GERD, anxiety, osteoarthritis, depression, essential hypertension, hypothyroidism, nonobstructive coronary artery disease and prediabetes, RLS and CVA with residual problems with balance, who presented to the emergency room with acute onset of fall at a restaurant when she was trying to get up and lost her balance falling on the left side.  She denied any presyncope or syncope.  No headache or dizziness or blurred vision.  No new paresthesias or focal muscle weakness.  No chest pain or palpitations.  No recent cough or wheezing or dyspnea.  No nausea or vomiting or abdominal pain.  No dysuria, oliguria or hematuria or flank pain.  No fever or chills.     Assessment & Plan:   Principal Problem:   Closed left hip fracture, initial encounter [facility]) Active Problems:   Essential hypertension   Dyslipidemia   Hypothyroidism   Paroxysmal atrial fibrillation (HCC)   GERD without esophagitis   Anxiety and depression   Chronic diastolic CHF (congestive heart failure) (HCC)  Closed left hip fracture Mechanical fall No evidence of syncopal event Orthopedics consulted from ED Status post left hip hemiarthroplasty on 2/22 Plan: Continue therapy efforts DVT prophylaxis, subcu Lovenox x 14 days Pain control Will need skilled nursing facility BM reported today   Essential hypertension Orthostatic hypotension Patient with symptomatic orthostatic drop upon position change Plan: 2/27 Resume Losartan 25 mg po daily  From 3/1 increased losartan 50 mg p.o. daily Continue orthostatics Bilateral TED hose Encourage p.o. fluid intake Avoid IV  fluids Albumin bolus 25 g 25% x 1 Consider abd binder if mobility efforts are hampered by orthostasis 3/1 orthostatics positive 153/64 (Laying), 160/70 (sitting), 133/76 (standing)   Acute hypoxic respiratory failure, resolved  Likely secondary to mild fluid overload.  Received Lasix doses on 2/23 and 2/24 Urine appears quite concentrated, no need of diuresis Encourage p.o. fluid intake Avoid IV fluids given propensity to cause pulmonary edema  Severe iron deficiency anemia Patient on oral iron replacement however iron stores remain markedly low with low saturation ratios Plan: S/p IV Venofer 300 mg daily x 3 doses given  B12 level 374, keep > 400, started oral supplement. Resume oral iron supplement on discharge  Dyslipidemia Continue home regimen   Hypothyroidism PTA Synthroid   Chronic diastolic CHF (congestive heart failure) No evidence of exacerbation ARB held in setting of orthostatic hypotension   Anxiety and depression PTA Xanax and Zoloft   GERD without esophagitis PPI   Paroxysmal atrial fibrillation  Not on DOAC due to ICH, s/p Watchman device Continue amiodarone and metoprolol Heart rate is under control, slightly bradycardiac   DVT prophylaxis: SQ Lovenox Code Status: Full Family Communication:Daughter at bedside 2/22, via phone on 2/23, at bedside 2/24 Disposition Plan: Status is: Inpatient Remains inpatient appropriate because: hip fracture pending SNF placement   Level of care: Med-Surg  Consultants:  Orthopedics  Procedures:  Left hip hemiarthroplasty 2/22  Antimicrobials: Ancef, perioperative   Subjective: Patient was seen and examined bedside, sitting comfortably in the recliner, stated the pain is 3/10 at rest, it hurts more while walking but she is getting better, overall feels improvement.   Objective: Vitals:   07/28/23 0727 07/28/23  1914 07/28/23 1003 07/28/23 1007  BP: (!) 161/72 (!) 153/64 (!) 160/70 133/76  Pulse: (!) 59  66 64 68  Resp: 16     Temp: 98.1 F (36.7 C)     TempSrc: Oral     SpO2: 98%     Weight:      Height:        Intake/Output Summary (Last 24 hours) at 07/28/2023 1327 Last data filed at 07/28/2023 0658 Gross per 24 hour  Intake 360 ml  Output 1 ml  Net 359 ml   Filed Weights   07/20/23 2037  Weight: 61.1 kg    Examination:  General exam: NAD Respiratory system: Lungs clear.  Normal work of breathing.  Room air Cardiovascular system: S1-S2, RRR, no murmur, no pedal edema Gastrointestinal system: Soft, NTND, normal BS Central nervous system: Alert and oriented. No focal neurological deficits. Extremities: left hip dressing CDI, gait not assessed Skin: No rashes, lesions or ulcers, appears pale Psychiatry: Judgement and insight appear normal. Mood & affect appropriate.     Data Reviewed: I have personally reviewed following labs and imaging studies  CBC: Recent Labs  Lab 07/24/23 0304 07/25/23 0944 07/26/23 0519 07/27/23 0532 07/28/23 0624  WBC 6.2 5.5 4.4 5.6 5.3  HGB 8.3* 9.3* 8.4* 9.2* 9.1*  HCT 25.6* 28.7* 25.3* 28.5* 28.2*  MCV 92.8 92.6 92.0 93.8 93.4  PLT 197 263 235 280 270   Basic Metabolic Panel: Recent Labs  Lab 07/24/23 0304 07/25/23 0944 07/26/23 0519 07/27/23 0532 07/28/23 0624  NA 135 138 138 139 140  K 4.3 4.4 4.1 3.7 3.7  CL 101 102 104 106 110  CO2 27 27 26 25 25   GLUCOSE 112* 117* 100* 103* 101*  BUN 23 16 17 14 11   CREATININE 0.93 0.76 0.75 0.68 0.60  CALCIUM 8.2* 8.9 8.5* 8.7* 8.7*  MG  --  2.3 2.2 2.2 2.1  PHOS  --  3.8 4.0 3.5 3.6   GFR: Estimated Creatinine Clearance: 47.9 mL/min (by C-G formula based on SCr of 0.6 mg/dL). Liver Function Tests: No results for input(s): "AST", "ALT", "ALKPHOS", "BILITOT", "PROT", "ALBUMIN" in the last 168 hours. No results for input(s): "LIPASE", "AMYLASE" in the last 168 hours. No results for input(s): "AMMONIA" in the last 168 hours. Coagulation Profile: No results for input(s): "INR",  "PROTIME" in the last 168 hours.  Cardiac Enzymes: No results for input(s): "CKTOTAL", "CKMB", "CKMBINDEX", "TROPONINI" in the last 168 hours. BNP (last 3 results) No results for input(s): "PROBNP" in the last 8760 hours. HbA1C: No results for input(s): "HGBA1C" in the last 72 hours. CBG: No results for input(s): "GLUCAP" in the last 168 hours. Lipid Profile: No results for input(s): "CHOL", "HDL", "LDLCALC", "TRIG", "CHOLHDL", "LDLDIRECT" in the last 72 hours. Thyroid Function Tests: No results for input(s): "TSH", "T4TOTAL", "FREET4", "T3FREE", "THYROIDAB" in the last 72 hours. Anemia Panel: No results for input(s): "VITAMINB12", "FOLATE", "FERRITIN", "TIBC", "IRON", "RETICCTPCT" in the last 72 hours.  Sepsis Labs: No results for input(s): "PROCALCITON", "LATICACIDVEN" in the last 168 hours.  Recent Results (from the past 240 hours)  Surgical PCR screen     Status: None   Collection Time: 07/20/23 10:05 PM   Specimen: Nasal Mucosa; Nasal Swab  Result Value Ref Range Status   MRSA, PCR NEGATIVE NEGATIVE Final   Staphylococcus aureus NEGATIVE NEGATIVE Final    Comment: (NOTE) The Xpert SA Assay (FDA approved for NASAL specimens in patients 41 years of age and older), is one component  of a comprehensive surveillance program. It is not intended to diagnose infection nor to guide or monitor treatment. Performed at [facility], [address], [place], [place] [address]       Radiology Studies: No results found.   Scheduled Meds:  ALPRAZolam  0.25 mg Oral QHS   amiodarone  100 mg Oral Daily   aspirin EC  81 mg Oral BID   atorvastatin  40 mg Oral Daily   brimonidine  1 drop Both Eyes TID   calcium-vitamin D  1 tablet Oral Daily   cholecalciferol  5,000 Units Oral Daily   vitamin B-12  500 mcg Oral Daily   levothyroxine  100 mcg Oral Q0600   losartan  50 mg Oral Daily   multivitamin with minerals  1 tablet Oral Q1500   pantoprazole  40 mg Oral Daily    polyethylene glycol  17 g Oral Daily   senna-docusate  1 tablet Oral BID   simethicone  80 mg Oral QID   Continuous Infusions:    LOS: 8 days    Gillis Santa, MD [facility]   If 7PM-7AM, please contact night-coverage  07/28/2023, 1:27 PM

## 2023-07-28 NOTE — Progress Notes (Signed)
 Physical Therapy Treatment Patient Details Name: Sandra [name] MRN: [id number] DOB: [birth year] Today's Date: 07/28/2023   History of Present Illness 78 y.o. Caucasian female with medical history significant for HFpEF, paroxysmal atrial fibrillation (used to be on Eliquis and having after having ICH it was stopped and is currently having watchman's device and loop recorder), GERD, anxiety, osteoarthritis, depression, essential hypertension, hypothyroidism, nonobstructive coronary artery disease and prediabetes, RLS and CVA with residual problems with balance, who presented to the emergency room with acute onset of fall at a restaurant when she was trying to get up and lost her balance falling on the left side.  Pt is s/p L hemiarthroplasty on 07/21/23.    PT Comments  Pt is A and O x 4. Eager to participate and requesting to urgently get to BR. Pt required min assist to exit bed. Only supervision throughout remainder of session. She tolerated ambulation 2 laps around RN station. Reviewed precautions, WB bearing restrictions, and importance of HEP performance. Pt is progressing well. Only endorses minimal soreness in LE. Pt stated 2 times during session, "If I'm not at rehab Monday, I'll probably just go home."  Acute PT will continue to follow per current POC.   If plan is discharge home, recommend the following: A little help with walking and/or transfers;A little help with bathing/dressing/bathroom;Assistance with cooking/housework;Direct supervision/assist for medications management;Direct supervision/assist for financial management;Assist for transportation;Help with stairs or ramp for entrance     Equipment Recommendations  Rolling walker (2 wheels);BSC/3in1       Precautions / Restrictions Precautions Precautions: Posterior Hip Precaution Booklet Issued: Yes (comment) Recall of Precautions/Restrictions: Impaired Precaution/Restrictions Comments: recalled 2 of 3 Restrictions Weight Bearing  Restrictions Per Provider Order: Yes LLE Weight Bearing Per Provider Order: Partial weight bearing LLE Partial Weight Bearing Percentage or Pounds: 50     Mobility  Bed Mobility Overal bed mobility: Needs Assistance Bed Mobility: Supine to Sit  Supine to sit: Min assist  General bed mobility comments: Pt required min assist to exit bed. would benefit from education on use of get belt to assist LE off EOB. pt somewhat impulsivly gets OOB due to eagerness to ambulate to BR    Transfers Overall transfer level: Needs assistance Equipment used: Rolling walker (2 wheels) Transfers: Sit to/from Stand Sit to Stand: Supervision  General transfer comment: Pt performed STS from EOB with supervision only    Ambulation/Gait Ambulation/Gait assistance: Supervision Gait Distance (Feet): 400 Feet Assistive device: Rolling walker (2 wheels) Gait Pattern/deviations: Antalgic, Step-through pattern Gait velocity: Decreased  General Gait Details: pt ambulated and tolerated 2 laps in hallway with use of RW   Stairs Stairs:  (pt elected not to perform stairs this date)     Balance Overall balance assessment: Needs assistance Sitting-balance support: No upper extremity supported, Feet supported Sitting balance-Leahy Scale: Good     Standing balance support: Bilateral upper extremity supported, During functional activity, Reliant on assistive device for balance Standing balance-Leahy Scale: Good       Communication Communication Communication: No apparent difficulties Factors Affecting Communication: Hearing impaired  Cognition Arousal: Alert Behavior During Therapy: WFL for tasks assessed/performed   PT - Cognitive impairments: No apparent impairments    PT - Cognition Comments: Pt A and O x 4 Following commands: Intact      Cueing Cueing Techniques: Verbal cues     General Comments General comments (skin integrity, edema, etc.): reviewed precautions, WB status,importance of HEP,  referred pt to mobility tech team for increased OOB  activity. pt progressing well overall. did require more assistance to get OOB today versyus previous date      Pertinent Vitals/Pain Pain Assessment Pain Assessment: 0-10 Pain Score: 2  Pain Location: L Hip Pain Descriptors / Indicators: Burning, Sore Pain Intervention(s): Limited activity within patient's tolerance, Monitored during session, Premedicated before session, Repositioned     PT Goals (current goals can now be found in the care plan section) Acute Rehab PT Goals Patient Stated Goal: rehab then home Progress towards PT goals: Progressing toward goals    Frequency    7X/week       AM-PAC PT "6 Clicks" Mobility   Outcome Measure  Help needed turning from your back to your side while in a flat bed without using bedrails?: A Little Help needed moving from lying on your back to sitting on the side of a flat bed without using bedrails?: A Little Help needed moving to and from a bed to a chair (including a wheelchair)?: A Little Help needed standing up from a chair using your arms (e.g., wheelchair or bedside chair)?: A Little Help needed to walk in hospital room?: A Little Help needed climbing 3-5 steps with a railing? : A Little 6 Click Score: 18    End of Session   Activity Tolerance: Patient tolerated treatment well Patient left: in chair;with call bell/phone within reach;with chair alarm set (author assisted pt with ordring breakfast) Nurse Communication: Mobility status PT Visit Diagnosis: Unsteadiness on feet (R26.81);Other abnormalities of gait and mobility (R26.89);Repeated falls (R29.6);Muscle weakness (generalized) (M62.81);History of falling (Z91.81);Ataxic gait (R26.0);Difficulty in walking, not elsewhere classified (R26.2);Dizziness and giddiness (R42)     Time: 7846-9629 PT Time Calculation (min) (ACUTE ONLY): 22 min  Charges:    $Gait Training: 8-22 mins PT General Charges $$ ACUTE PT VISIT: 1  Visit                     Jetta Lout PTA 07/28/23, 8:49 AM

## 2023-07-28 NOTE — Progress Notes (Signed)
 Mobility Specialist - Progress Note   07/28/23 1314  Mobility  Activity Ambulated with assistance in hallway  Level of Assistance Standby assist, set-up cues, supervision of patient - no hands on  Assistive Device Front wheel walker  Distance Ambulated (ft) 400 ft  LLE Weight Bearing Per Provider Order PWB  LLE Partial Weight Bearing Percentage or Pounds 50  Activity Response Tolerated well  Mobility Referral Yes  Mobility visit 1 Mobility  Mobility Specialist Start Time (ACUTE ONLY) 1244  Mobility Specialist Stop Time (ACUTE ONLY) 1303  Mobility Specialist Time Calculation (min) (ACUTE ONLY) 19 min   Pt sitting in recliner on RA upon arrival. Pt STS and ambulates 2 laps around NS Supervision. Pt returns to recliner with needs in reach.   Terrilyn Saver  Mobility Specialist  07/28/23 1:16 PM

## 2023-07-29 DIAGNOSIS — S72002A Fracture of unspecified part of neck of left femur, initial encounter for closed fracture: Secondary | ICD-10-CM | POA: Diagnosis not present

## 2023-07-29 LAB — CBC
HCT: 29.4 % — ABNORMAL LOW (ref 36.0–46.0)
Hemoglobin: 9.5 g/dL — ABNORMAL LOW (ref 12.0–15.0)
MCH: 30.6 pg (ref 26.0–34.0)
MCHC: 32.3 g/dL (ref 30.0–36.0)
MCV: 94.8 fL (ref 80.0–100.0)
Platelets: 284 10*3/uL (ref 150–400)
RBC: 3.1 MIL/uL — ABNORMAL LOW (ref 3.87–5.11)
RDW: 14.2 % (ref 11.5–15.5)
WBC: 6.7 10*3/uL (ref 4.0–10.5)
nRBC: 0 % (ref 0.0–0.2)

## 2023-07-29 LAB — BASIC METABOLIC PANEL
Anion gap: 7 (ref 5–15)
BUN: 13 mg/dL (ref 8–23)
CO2: 24 mmol/L (ref 22–32)
Calcium: 8.6 mg/dL — ABNORMAL LOW (ref 8.9–10.3)
Chloride: 108 mmol/L (ref 98–111)
Creatinine, Ser: 0.64 mg/dL (ref 0.44–1.00)
GFR, Estimated: >60 mL/min (ref >60)
Glucose, Bld: 101 mg/dL — ABNORMAL HIGH (ref 70–99)
Potassium: 3.7 mmol/L (ref 3.5–5.1)
Sodium: 139 mmol/L (ref 135–145)

## 2023-07-29 MED ORDER — HYDRALAZINE HCL 50 MG PO TABS: 50.0000 mg | ORAL_TABLET | Freq: Four times a day (QID) | ORAL | Status: DC | PRN

## 2023-07-29 NOTE — Progress Notes (Signed)
 Physical Therapy Treatment Patient Details Name: Sandra [name] MRN: [id number] DOB: 12/28/1945 Today's Date: 07/29/2023   History of Present Illness 78 y.o. Caucasian female with medical history significant for HFpEF, paroxysmal atrial fibrillation (used to be on Eliquis and having after having ICH it was stopped and is currently having watchman's device and loop recorder), GERD, anxiety, osteoarthritis, depression, essential hypertension, hypothyroidism, nonobstructive coronary artery disease and prediabetes, RLS and CVA with residual problems with balance, who presented to the emergency room with acute onset of fall at a restaurant when she was trying to get up and lost her balance falling on the left side.  Pt is s/p L hemiarthroplasty on 07/21/23.    PT Comments  Pt ready for session.  Stood to [employer] with some unsteadiness and decreased safety as she stood before tray table was removed fully and RW was still off to the side.  She is able to walk to/from rehab gym for stair training.  Needed mod vc's and min physical assist to navigate.  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  Pt continues to feel she would benefit from rehab before transition home.  Safety deficits noted during session.    If plan is discharge home, recommend the following: A little help with walking and/or transfers;A little help with bathing/dressing/bathroom;Assistance with cooking/housework;Direct supervision/assist for medications management;Direct supervision/assist for financial management;Assist for transportation;Help with stairs or ramp for entrance   Can travel by private vehicle        Equipment Recommendations  Rolling walker (2 wheels);BSC/3in1    Recommendations for Other Services       Precautions / Restrictions Restrictions Weight Bearing Restrictions Per Provider Order: Yes LLE Weight Bearing Per Provider Order: Partial weight bearing     Mobility  Bed  Mobility               General bed mobility comments: in chair before and after Patient Response: Cooperative  Transfers Overall transfer level: Needs assistance Equipment used: Rolling walker (2 wheels) Transfers: Sit to/from Stand Sit to Stand: Contact guard assist           General transfer comment: some unsteadiness with transitions    Ambulation/Gait Ambulation/Gait assistance: Contact guard assist Gait Distance (Feet): 400 Feet Assistive device: Rolling walker (2 wheels) Gait Pattern/deviations: Antalgic, Step-through pattern Gait velocity: Decreased     General Gait Details: initially with a limp that improves with time   Stairs Stairs: Yes Stairs assistance: Min assist Stair Management: Two rails, Step to pattern Number of Stairs: 4     Wheelchair Mobility     Tilt Bed Tilt Bed Patient Response: Cooperative  Modified Rankin (Stroke Patients Only)       Balance Overall balance assessment: Needs assistance Sitting-balance support: No upper extremity supported, Feet supported Sitting balance-Leahy Scale: Good     Standing balance support: Bilateral upper extremity supported, During functional activity, Reliant on assistive device for balance Standing balance-Leahy Scale: Fair                              Communication    Cognition Arousal: Alert Behavior During Therapy: WFL for tasks assessed/performed   PT - Cognitive impairments: No apparent impairments                                Cueing    Exercises  General Comments        Pertinent Vitals/Pain Pain Assessment Pain Assessment: Faces Faces Pain Scale: Hurts a little bit Pain Location: L Hip Pain Intervention(s): Limited activity within patient's tolerance, Monitored during session, Premedicated before session, Repositioned    Home Living                          Prior Function            PT Goals (current goals can now be  found in the care plan section) Progress towards PT goals: Progressing toward goals    Frequency    7X/week      PT Plan      Co-evaluation              AM-PAC PT "6 Clicks" Mobility   Outcome Measure  Help needed turning from your back to your side while in a flat bed without using bedrails?: A Little Help needed moving from lying on your back to sitting on the side of a flat bed without using bedrails?: A Little Help needed moving to and from a bed to a chair (including a wheelchair)?: A Little Help needed standing up from a chair using your arms (e.g., wheelchair or bedside chair)?: A Little Help needed to walk in hospital room?: A Little Help needed climbing 3-5 steps with a railing? : A Little 6 Click Score: 18    End of Session Equipment Utilized During Treatment: Gait belt Activity Tolerance: Patient tolerated treatment well Patient left: in chair;with call bell/phone within reach;with chair alarm set Nurse Communication: Mobility status PT Visit Diagnosis: Unsteadiness on feet (R26.81);Other abnormalities of gait and mobility (R26.89);Repeated falls (R29.6);Muscle weakness (generalized) (M62.81);History of falling (Z91.81);Ataxic gait (R26.0);Difficulty in walking, not elsewhere classified (R26.2);Dizziness and giddiness (R42)     Time: 1610-9604 PT Time Calculation (min) (ACUTE ONLY): 12 min  Charges:    $Gait Training: 8-22 mins PT General Charges $$ ACUTE PT VISIT: 1 Visit                   Danielle Dess, PTA 07/29/23, 12:41 PM

## 2023-07-29 NOTE — Progress Notes (Signed)
 Mobility Specialist - Progress Note   07/29/23 0827  Mobility  Activity Ambulated with assistance in hallway;Stood at bedside;Dangled on edge of bed  Level of Assistance Standby assist, set-up cues, supervision of patient - no hands on  Assistive Device Front wheel walker  Distance Ambulated (ft) 400 ft  LLE Weight Bearing Per Provider Order PWB  LLE Partial Weight Bearing Percentage or Pounds 50  Activity Response Tolerated well  Mobility Referral Yes  Mobility visit 1 Mobility  Mobility Specialist Start Time (ACUTE ONLY) 0744  Mobility Specialist Stop Time (ACUTE ONLY) 0803  Mobility Specialist Time Calculation (min) (ACUTE ONLY) 19 min   Pt sitting in recliner on RA upon arrival. Pt STS and ambulates in hallway Supervision. Pt returns to recliner with needs in reach.   Terrilyn Saver  Mobility Specialist  07/29/23 8:28 AM

## 2023-07-29 NOTE — Progress Notes (Signed)
 Mobility Specialist - Progress Note   07/29/23 1645  Mobility  Activity Ambulated with assistance in hallway;Stood at bedside;Dangled on edge of bed  Level of Assistance Standby assist, set-up cues, supervision of patient - no hands on  Assistive Device Front wheel walker  Distance Ambulated (ft) 400 ft  LLE Weight Bearing Per Provider Order PWB  LLE Partial Weight Bearing Percentage or Pounds 50  Activity Response Tolerated well  Mobility Referral Yes  Mobility visit 1 Mobility  Mobility Specialist Start Time (ACUTE ONLY) 1613  Mobility Specialist Stop Time (ACUTE ONLY) 1634  Mobility Specialist Time Calculation (min) (ACUTE ONLY) 21 min   Pt supine in bed on RA upon arrival. Pt completes bed mobility indep. Pt STS and ambulates in hallway SBA. Pt returns to bed with needs in reach and bed alarm activated.   Terrilyn Saver  Mobility Specialist  07/29/23 4:46 PM

## 2023-07-29 NOTE — Plan of Care (Signed)

## 2023-07-29 NOTE — Progress Notes (Signed)
 PROGRESS NOTE    Sandra [name]  YQM:[id number] DOB: Jun 08, 1945 DOA: 07/20/2023 PCP: Louis Matte, MD    Brief Narrative:  78 y.o. Caucasian female with medical history significant for HFpEF, paroxysmal atrial fibrillation (used to be on Eliquis and having after having ICH it was stopped and is currently having watchman's device and loop recorder), GERD, anxiety, osteoarthritis, depression, essential hypertension, hypothyroidism, nonobstructive coronary artery disease and prediabetes, RLS and CVA with residual problems with balance, who presented to the emergency room with acute onset of fall at a restaurant when she was trying to get up and lost her balance falling on the left side.  She denied any presyncope or syncope.  No headache or dizziness or blurred vision.  No new paresthesias or focal muscle weakness.  No chest pain or palpitations.  No recent cough or wheezing or dyspnea.  No nausea or vomiting or abdominal pain.  No dysuria, oliguria or hematuria or flank pain.  No fever or chills.     Assessment & Plan:   Principal Problem:   Closed left hip fracture, initial encounter [facility]) Active Problems:   Essential hypertension   Dyslipidemia   Hypothyroidism   Paroxysmal atrial fibrillation (HCC)   GERD without esophagitis   Anxiety and depression   Chronic diastolic CHF (congestive heart failure) (HCC)  Closed left hip fracture Mechanical fall No evidence of syncopal event Orthopedics consulted from ED Status post left hip hemiarthroplasty on 2/22 Plan: Continue therapy efforts DVT prophylaxis, subcu Lovenox x 14 days Pain control Will need skilled nursing facility BM reported today   Essential hypertension Orthostatic hypotension Patient with symptomatic orthostatic drop upon position change Plan: 2/27 Resume Losartan 25 mg po daily  From 3/1 increased losartan 50 mg p.o. daily Continue orthostatics Bilateral TED hose Encourage p.o. fluid intake Avoid IV  fluids Albumin bolus 25 g 25% x 1 Consider abd binder if mobility efforts are hampered by orthostasis 3/1 orthostatics positive 153/64 (Laying), 160/70 (sitting), 133/76 (standing) 3/2 started hydralazine 50 mg p.o. every 6 hourly as needed if SBP > 160 mmHg  Acute hypoxic respiratory failure, resolved  Likely secondary to mild fluid overload.  Received Lasix doses on 2/23 and 2/24 Urine appears quite concentrated, no need of diuresis Encourage p.o. fluid intake Avoid IV fluids given propensity to cause pulmonary edema  Severe iron deficiency anemia Patient on oral iron replacement however iron stores remain markedly low with low saturation ratios Plan: S/p IV Venofer 300 mg daily x 3 doses given  B12 level 374, keep > 400, started oral supplement. Resume oral iron supplement on discharge  Dyslipidemia Continue home regimen   Hypothyroidism PTA Synthroid   Chronic diastolic CHF (congestive heart failure) No evidence of exacerbation ARB held in setting of orthostatic hypotension   Anxiety and depression PTA Xanax and Zoloft   GERD without esophagitis PPI   Paroxysmal atrial fibrillation  Not on DOAC due to ICH, s/p Watchman device Continue amiodarone and metoprolol Heart rate is under control, slightly bradycardiac   DVT prophylaxis: SQ Lovenox Code Status: Full Family Communication:Daughter at bedside 2/22, via phone on 2/23, at bedside 2/24 Disposition Plan: Status is: Inpatient Remains inpatient appropriate because: hip fracture pending SNF placement   Level of care: Med-Surg  Consultants:  Orthopedics  Procedures:  Left hip hemiarthroplasty 2/22  Antimicrobials: Ancef, perioperative   Subjective: Patient was seen and examined bedside, sitting comfortably in the recliner, pain is under control, she is supposed to ambulate with walker in the hallway.  She takes her own time to stand up and stabilize and then she is walking well with a walker.  Pain is  under control.  Denied feeling dizzy while standing up.   Objective: Vitals:   07/28/23 1007 07/28/23 1523 07/28/23 2115 07/29/23 0810  BP: 133/76 131/60 (!) 161/65 (!) 160/65  Pulse: 68 70 68 63  Resp:  16 18 18   Temp:  98.4 F (36.9 C) 98.4 F (36.9 C) 97.6 F (36.4 C)  TempSrc:      SpO2:  97% 99% 99%  Weight:      Height:        Intake/Output Summary (Last 24 hours) at 07/29/2023 1407 Last data filed at 07/29/2023 1100 Gross per 24 hour  Intake 360 ml  Output --  Net 360 ml   Filed Weights   07/20/23 2037  Weight: 61.1 kg    Examination:  General exam: NAD Respiratory system: Lungs clear.  Normal work of breathing.  Room air Cardiovascular system: S1-S2, RRR, no murmur, no pedal edema Gastrointestinal system: Soft, NTND, normal BS Central nervous system: Alert and oriented. No focal neurological deficits. Extremities: left hip dressing CDI, gait not assessed Skin: No rashes, lesions or ulcers, appears pale Psychiatry: Judgement and insight appear normal. Mood & affect appropriate.     Data Reviewed: I have personally reviewed following labs and imaging studies  CBC: Recent Labs  Lab 07/25/23 0944 07/26/23 0519 07/27/23 0532 07/28/23 0624 07/29/23 0500  WBC 5.5 4.4 5.6 5.3 6.7  HGB 9.3* 8.4* 9.2* 9.1* 9.5*  HCT 28.7* 25.3* 28.5* 28.2* 29.4*  MCV 92.6 92.0 93.8 93.4 94.8  PLT 263 235 280 270 284   Basic Metabolic Panel: Recent Labs  Lab 07/25/23 0944 07/26/23 0519 07/27/23 0532 07/28/23 0624 07/29/23 0500  NA 138 138 139 140 139  K 4.4 4.1 3.7 3.7 3.7  CL 102 104 106 110 108  CO2 27 26 25 25 24   GLUCOSE 117* 100* 103* 101* 101*  BUN 16 17 14 11 13   CREATININE 0.76 0.75 0.68 0.60 0.64  CALCIUM 8.9 8.5* 8.7* 8.7* 8.6*  MG 2.3 2.2 2.2 2.1  --   PHOS 3.8 4.0 3.5 3.6  --    GFR: Estimated Creatinine Clearance: 47.9 mL/min (by C-G formula based on SCr of 0.64 mg/dL). Liver Function Tests: No results for input(s): "AST", "ALT", "ALKPHOS",  "BILITOT", "PROT", "ALBUMIN" in the last 168 hours. No results for input(s): "LIPASE", "AMYLASE" in the last 168 hours. No results for input(s): "AMMONIA" in the last 168 hours. Coagulation Profile: No results for input(s): "INR", "PROTIME" in the last 168 hours.  Cardiac Enzymes: No results for input(s): "CKTOTAL", "CKMB", "CKMBINDEX", "TROPONINI" in the last 168 hours. BNP (last 3 results) No results for input(s): "PROBNP" in the last 8760 hours. HbA1C: No results for input(s): "HGBA1C" in the last 72 hours. CBG: No results for input(s): "GLUCAP" in the last 168 hours. Lipid Profile: No results for input(s): "CHOL", "HDL", "LDLCALC", "TRIG", "CHOLHDL", "LDLDIRECT" in the last 72 hours. Thyroid Function Tests: No results for input(s): "TSH", "T4TOTAL", "FREET4", "T3FREE", "THYROIDAB" in the last 72 hours. Anemia Panel: No results for input(s): "VITAMINB12", "FOLATE", "FERRITIN", "TIBC", "IRON", "RETICCTPCT" in the last 72 hours.  Sepsis Labs: No results for input(s): "PROCALCITON", "LATICACIDVEN" in the last 168 hours.  Recent Results (from the past 240 hours)  Surgical PCR screen     Status: None   Collection Time: 07/20/23 10:05 PM   Specimen: Nasal Mucosa; Nasal Swab  Result  Value Ref Range Status   MRSA, PCR NEGATIVE NEGATIVE Final   Staphylococcus aureus NEGATIVE NEGATIVE Final    Comment: (NOTE) The Xpert SA Assay (FDA approved for NASAL specimens in patients 35 years of age and older), is one component of a comprehensive surveillance program. It is not intended to diagnose infection nor to guide or monitor treatment. Performed at [facility], [address], [place], [place] [address]       Radiology Studies: No results found.   Scheduled Meds:  ALPRAZolam  0.25 mg Oral QHS   amiodarone  100 mg Oral Daily   aspirin EC  81 mg Oral BID   atorvastatin  40 mg Oral Daily   brimonidine  1 drop Both Eyes TID   calcium-vitamin D  1 tablet Oral Daily    cholecalciferol  5,000 Units Oral Daily   vitamin B-12  500 mcg Oral Daily   levothyroxine  100 mcg Oral Q0600   losartan  50 mg Oral Daily   multivitamin with minerals  1 tablet Oral Q1500   pantoprazole  40 mg Oral Daily   polyethylene glycol  17 g Oral Daily   senna-docusate  1 tablet Oral BID   simethicone  80 mg Oral QID   Continuous Infusions:    LOS: 9 days    Gillis Santa, MD [facility]   If 7PM-7AM, please contact night-coverage  07/29/2023, 2:07 PM

## 2023-07-30 ENCOUNTER — Other Ambulatory Visit: Payer: Self-pay

## 2023-07-30 DIAGNOSIS — S72002A Fracture of unspecified part of neck of left femur, initial encounter for closed fracture: Secondary | ICD-10-CM | POA: Diagnosis not present

## 2023-07-30 LAB — BASIC METABOLIC PANEL
Anion gap: 7 (ref 5–15)
BUN: 18 mg/dL (ref 8–23)
CO2: 24 mmol/L (ref 22–32)
Calcium: 8.7 mg/dL — ABNORMAL LOW (ref 8.9–10.3)
Chloride: 107 mmol/L (ref 98–111)
Creatinine, Ser: 0.67 mg/dL (ref 0.44–1.00)
GFR, Estimated: >60 mL/min (ref >60)
Glucose, Bld: 114 mg/dL — ABNORMAL HIGH (ref 70–99)
Potassium: 3.5 mmol/L (ref 3.5–5.1)
Sodium: 138 mmol/L (ref 135–145)

## 2023-07-30 LAB — CBC
HCT: 29.2 % — ABNORMAL LOW (ref 36.0–46.0)
Hemoglobin: 9.3 g/dL — ABNORMAL LOW (ref 12.0–15.0)
MCH: 30.9 pg (ref 26.0–34.0)
MCHC: 31.8 g/dL (ref 30.0–36.0)
MCV: 97 fL (ref 80.0–100.0)
Platelets: 251 10*3/uL (ref 150–400)
RBC: 3.01 MIL/uL — ABNORMAL LOW (ref 3.87–5.11)
RDW: 14.4 % (ref 11.5–15.5)
WBC: 5.4 10*3/uL (ref 4.0–10.5)
nRBC: 0 % (ref 0.0–0.2)

## 2023-07-30 MED ORDER — CYANOCOBALAMIN 500 MCG PO TABS
500.0000 ug | ORAL_TABLET | Freq: Every day | ORAL | 0 refills | Status: AC
  Filled 2023-07-30: qty 90, 90d supply, fill #0

## 2023-07-30 MED ORDER — ASPIRIN EC 81 MG PO TBEC: 81.0000 mg | DELAYED_RELEASE_TABLET | Freq: Every day | ORAL | Status: DC

## 2023-07-30 MED ORDER — POTASSIUM CHLORIDE CRYS ER 20 MEQ PO TBCR
40.0000 meq | EXTENDED_RELEASE_TABLET | Freq: Once | ORAL | Status: AC
  Administered 2023-07-30: 40 meq via ORAL
  Filled 2023-07-30: qty 2

## 2023-07-30 MED ORDER — HYDRALAZINE HCL 50 MG PO TABS
50.0000 mg | ORAL_TABLET | Freq: Three times a day (TID) | ORAL | 0 refills | Status: DC | PRN
  Filled 2023-07-30: qty 90, 30d supply, fill #0

## 2023-07-30 MED ORDER — ACETAMINOPHEN 325 MG PO TABS
650.0000 mg | ORAL_TABLET | Freq: Four times a day (QID) | ORAL | 0 refills | Status: AC | PRN
  Filled 2023-07-30: qty 100, 13d supply, fill #0

## 2023-07-30 MED ORDER — ASPIRIN 81 MG PO CHEW
81.0000 mg | CHEWABLE_TABLET | Freq: Two times a day (BID) | ORAL | 0 refills | Status: AC
Start: 2023-07-30 — End: 2023-09-04
  Filled 2023-07-30 (×4): qty 30, 15d supply, fill #0

## 2023-07-30 NOTE — TOC Progression Note (Signed)
 Transition of Care [facility]) - Progression Note    Patient Details  Name: Sandra [name] MRN: [id number] Date of Birth: 08-31-1945  [facility]) CM/SW Contact  Marlowe Sax, RN Phone Number: 07/30/2023, 3:07 PM  Clinical Narrative:     Requested a Rw to be delivered to the bedside by [employer]       Expected Discharge Plan and Services         Expected Discharge Date: 07/30/23                                     Social Determinants of Health (SDOH) Interventions SDOH Screenings   Food Insecurity: No Food Insecurity (07/20/2023)  Housing: Low Risk  (07/20/2023)  Transportation Needs: Unmet Transportation Needs (07/20/2023)  Utilities: At Risk (07/20/2023)  Alcohol Screen: Low Risk  (06/22/2021)  Depression (PHQ2-9): Low Risk  (06/22/2021)  Financial Resource Strain: Low Risk  (05/18/2021)  Physical Activity: Insufficiently Active (05/18/2021)  Social Connections: Socially Integrated (07/20/2023)  Stress: No Stress Concern Present (05/18/2021)  Tobacco Use: Low Risk  (07/20/2023)    Readmission Risk Interventions     No data to display

## 2023-07-30 NOTE — Plan of Care (Signed)

## 2023-07-30 NOTE — Discharge Summary (Signed)
 [facility] Discharge Summary   Patient: Sandra [name] QMV:[id number]  PCP: Louis Matte, MD  Date of admission: 07/20/2023   Date of discharge: 07/30/2023     Discharge Diagnoses:  Principal Problem:   Closed left hip fracture, initial encounter [facility]) Active Problems:   Essential hypertension   Dyslipidemia   Hypothyroidism   Paroxysmal atrial fibrillation (HCC)   GERD without esophagitis   Anxiety and depression   Chronic diastolic CHF (congestive heart failure) (HCC)   Admitted From: Home Disposition:  Home with [facility]  Recommendations for Outpatient Follow-up:  With PCP in 1 week Follow-up with orthopedic surgery in 2 weeks for postop check and suture removal Follow up LABS/TEST:     Follow-up Information     Deeann Saint, MD Follow up in 2 week(s).   Specialty: Orthopedic Surgery Why: For suture removal, For wound re-check, For re-evaluation  Please make an appointment for this patient prior to her leaving the hospital Contact information: [address] [place] [address] [phone]         [name], Danton Clap, MD Follow up in 1 week(s).   Specialty: Internal Medicine Contact information: [address] [place] [address] [phone]                Diet recommendation: Cardiac diet  Activity: The patient is advised to gradually reintroduce usual activities, as tolerated  Discharge Condition: stable  Code Status: Full code   History of present illness: As per the H and P dictated on admission. Hospital Course:  78 y.o. Caucasian female with medical history significant for HFpEF, paroxysmal atrial fibrillation (used to be on Eliquis and having after having ICH it was stopped and is currently having watchman's device and loop recorder), GERD, anxiety, osteoarthritis, depression, essential hypertension, hypothyroidism, nonobstructive coronary artery disease and prediabetes, RLS and CVA with residual problems with  balance, who presented to the emergency room with acute onset of fall at a restaurant when she was trying to get up and lost her balance falling on the left side.  She denied any presyncope or syncope.  No headache or dizziness or blurred vision.  No new paresthesias or focal muscle weakness.  No chest pain or palpitations.  No recent cough or wheezing or dyspnea.  No nausea or vomiting or abdominal pain.  No dysuria, oliguria or hematuria or flank pain.  No fever or chills.   Assessment & Plan:   # Closed left hip fracture, s/p Mechanical fall No evidence of syncopal event. Orthopedics consulted from ED Status post left hip hemiarthroplasty on 2/22.  PT OT eval done, initially recommended SNF placement but during hospital stay patient improved and she felt that she can manage to go home with home physical therapy.  Discharged on aspirin 81 mg p.o. twice daily for total 6 weeks as per orthopedic surgery and recommended to follow in 2 weeks for postop check and suture removal.  Home health services were arranged by [facility] and patient was discharged today.  # Essential hypertension # Orthostatic hypotension Patient with symptomatic orthostatic drop upon position change S/p Albumin bolus 25 g 25% x 1, encouraged oral fluid intake, avoiding IV fluids.  S/p TED stockings. 2/27 Resume Losartan 25 mg po daily and on 3/1 increased losartan 50 mg p.o. daily, patient had orthostatic BP positive but she was not symptomatic as we were keeping blood pressure in 140s and 150s.  Resumed home dose losartan on discharge. 3/1 orthostatics positive 153/64 (Laying), 160/70 (sitting), 133/76 (  standing) 3/2 started hydralazine 50 mg p.o. every 6 hourly as needed if SBP > 160 mmHg Patient was advised to monitor BP at home and follow with PCP to titrate medication accordingly.  Go slow while changing position and hold onto something to prevent falls. # Acute hypoxic respiratory failure, resolved  Likely secondary to mild  fluid overload.  Received Lasix doses on 2/23 and 2/24.  Avoided IV fluids, patient was encouraged for oral fluids.  Currently saturating well on room air. # Severe iron deficiency anemia:  S/p IV Venofer 300 mg daily x 3 doses given.  Discharged on Niferex-150 milligram p.o. daily. B12 level 374, keep > 400, started oral supplement. # Dyslipidemia: Continue home regimen # Chronic diastolic CHF (congestive heart failure): Resumed losartan home dose on discharge.  Patient was advised to monitor BP as above and follow with PCP. # Paroxysmal atrial fibrillation: Not on DOAC due to ICH, s/p Watchman device. Continue amiodarone and metoprolol Heart rate is under control, slightly bradycardiac # Hypothyroidism: PTA Synthroid # Anxiety and depression: Continued Xanax and Zoloft home dose # GERD without esophagitis: PPI   Body mass index is 23.86 kg/m.  Nutrition Problem: Increased nutrient needs Etiology: post-op healing Nutrition Interventions: Interventions: Ensure Enlive (each supplement provides 350kcal and 20 grams of protein), MVI  - Patient was instructed, not to drive, operate heavy machinery, perform activities at heights, swimming or participation in water activities or provide baby sitting services while on Pain, Sleep and Anxiety Medications; until her outpatient Physician has advised to do so again.  - Also recommended to not to take more than prescribed Pain, Sleep and Anxiety Medications.  Patient was seen by physical therapy, who recommended Home health, which was arranged. On the day of the discharge the patient's vitals were stable, and no other acute medical condition were reported by patient. the patient was felt safe to be discharge at Home with Home health.  Consultants: Orthopedic surgery Procedures: s/p  left hip hemiarthroplasty on 2/22  Discharge Exam: General: Appear in no distress, no Rash; Oral Mucosa Clear, moist. Cardiovascular: S1 and S2 Present, no  Murmur, Respiratory: normal respiratory effort, Bilateral Air entry present and no Crackles, no wheezes Abdomen: Bowel Sound present, Soft and no tenderness, no hernia Extremities: no Pedal edema, no calf tenderness, left hip dressing dry and intact Neurology: alert and oriented to time, place, and person affect appropriate.  Filed Weights   07/20/23 2037  Weight: 61.1 kg   Vitals:   07/30/23 0721 07/30/23 0754  BP: (!) 160/78 (!) 154/75  Pulse:    Resp:    Temp:    SpO2:      DISCHARGE MEDICATION: Allergies as of 07/30/2023   No Known Allergies      Medication List     TAKE these medications    acetaminophen 325 MG tablet Commonly known as: TYLENOL Take 2 tablets (650 mg total) by mouth every 6 (six) hours as needed for mild pain (pain score 1-3), fever or headache (or temp > 100.5).   ALPRAZolam 0.5 MG tablet Commonly known as: XANAX Take 1 tablet (0.5 mg total) by mouth at bedtime as needed. for sleep   amiodarone 200 MG tablet Commonly known as: PACERONE Take 0.5 tablets (100 mg total) by mouth daily.   aspirin EC 81 MG tablet Take 1 tablet (81 mg total) by mouth 2 (two) times daily. Swallow whole. What changed: You were already taking a medication with the same name, and this prescription was added.  Make sure you understand how and when to take each.   aspirin EC 81 MG tablet Take 1 tablet (81 mg total) by mouth daily. Swallow whole. Restart after finishing aspirin 81 mg p.o. twice daily for total 6 weeks after surgery. Start taking on: September 03, 2023 What changed:  additional instructions These instructions start on September 03, 2023. If you are unsure what to do until then, ask your doctor or other care provider.   atorvastatin 40 MG tablet Commonly known as: LIPITOR Take 1 tablet (40 mg total) by mouth daily.   bisacodyl 5 MG EC tablet Generic drug: bisacodyl Take 5 mg by mouth daily as needed for moderate constipation.   brimonidine 0.2 % ophthalmic  solution Commonly known as: ALPHAGAN Place 1 drop into both eyes 3 (three) times daily.   CALCIUM 600 + D PO Take 1 tablet by mouth daily.   cyanocobalamin 500 MCG tablet Commonly known as: VITAMIN B12 Take 1 tablet (500 mcg total) by mouth daily. Start taking on: July 31, 2023   docusate sodium 100 MG capsule Commonly known as: COLACE Take 100 mg by mouth daily as needed for mild constipation.   hydrALAZINE 50 MG tablet Commonly known as: APRESOLINE Take 1 tablet (50 mg total) by mouth 3 (three) times daily as needed (SBP >160).   iron polysaccharides 150 MG capsule Commonly known as: NIFEREX Take 1 capsule (150 mg total) by mouth daily. Or can take any over-the-counter iron supplement.   latanoprost 0.005 % ophthalmic solution Commonly known as: XALATAN Place 1 drop into both eyes at bedtime.   levothyroxine 100 MCG tablet Commonly known as: SYNTHROID TAKE 1 TABLET BY MOUTH 30  MINUTES BEFORE BREAKFAST   losartan 50 MG tablet Commonly known as: COZAAR Take 1.5 tablets (75 mg total) by mouth daily.   metoprolol tartrate 25 MG tablet Commonly known as: LOPRESSOR Take 0.5 tablets (12.5 mg total) by mouth as needed (Afib episodes with HR > 120bpm.).   multivitamin with minerals Tabs tablet Take 1 tablet by mouth daily at 3 pm.   omeprazole 40 MG capsule Commonly known as: PRILOSEC TAKE 1 CAPSULE BY MOUTH DAILY   potassium chloride 20 MEQ packet Commonly known as: KLOR-CON DISSOLVE 2 PACKETS IN 4 OZ WATER OR JUICE AND DRINK DAILY   sertraline 100 MG tablet Commonly known as: ZOLOFT Take 100 mg by mouth daily.   Vitamin D3 125 MCG (5000 UT) Tabs Take 5,000 Units by mouth daily.       No Known Allergies Discharge Instructions     Call MD for:  difficulty breathing, headache or visual disturbances   Complete by: As directed    Call MD for:  extreme fatigue   Complete by: As directed    Call MD for:  persistant dizziness or light-headedness   Complete by:  As directed    Call MD for:  persistant nausea and vomiting   Complete by: As directed    Call MD for:  severe uncontrolled pain   Complete by: As directed    Call MD for:  temperature >100.4   Complete by: As directed    Diet - low sodium heart healthy   Complete by: As directed    Discharge instructions   Complete by: As directed    With PCP in 1 week Follow-up with orthopedic surgery in 2 weeks for postop check and suture removal   Increase activity slowly   Complete by: As directed  The results of significant diagnostics from this hospitalization (including imaging, microbiology, ancillary and laboratory) are listed below for reference.    Significant Diagnostic Studies: CUP PACEART REMOTE DEVICE CHECK Result Date: 07/24/2023 ILR summary report received. Battery status OK. Normal device function. No new symptom, tachy, brady, or pause episodes. No new AF episodes. Monthly summary reports and ROV/PRN LA, CVRS  DG Chest Port 1 View Result Date: 07/22/2023 CLINICAL DATA:  Hypoxia EXAM: PORTABLE CHEST 1 VIEW COMPARISON:  07/20/2023 FINDINGS: Single frontal view of the chest demonstrates stable enlargement of the cardiac silhouette. Left atrial appendage occlusion device and loop recorder unchanged. There is central pulmonary vascular congestion, with patchy perihilar airspace disease. No effusion or pneumothorax. No acute bony abnormalities. IMPRESSION: 1. Congestive heart failure with mild perihilar edema. Electronically Signed   By: Sharlet Salina M.D.   On: 07/22/2023 21:09   DG HIP UNILAT WITH PELVIS 2-3 VIEWS LEFT Result Date: 07/21/2023 CLINICAL DATA:  Status post left hip surgery. EXAM: DG HIP (WITH OR WITHOUT PELVIS) 2-3V LEFT COMPARISON:  Pelvis and left hip radiographs 07/20/2023 FINDINGS: There is diffuse decreased bone mineralization. Interval left hip hemiarthroplasty. No perihardware lucency is seen to indicate hardware failure or loosening. Moderate right  femoroacetabular, bilateral sacroiliac, and mild pubic symphysis joint space narrowing. Mild-to-moderate superolateral right acetabular and mild superior pubic symphysis degenerative osteophytosis. Moderate right and mild left inferior sacroiliac peripheral osteophytes with mild right-greater-than-left sacroiliac subchondral sclerosis. No acute fracture or dislocation. Postoperative lateral left hip subcutaneous air and lateral left hip surgical skin staples. Vascular phleboliths overlie the pelvis. IMPRESSION: Interval left hip hemiarthroplasty without evidence of hardware failure. Electronically Signed   By: Neita Garnet M.D.   On: 07/21/2023 13:24   DG Chest 1 View Result Date: 07/20/2023 CLINICAL DATA:  Fall. EXAM: CHEST  1 VIEW COMPARISON:  Oct 10, 2022. FINDINGS: Stable cardiomediastinal silhouette. Both lungs are clear. The visualized skeletal structures are unremarkable. IMPRESSION: No active disease. Electronically Signed   By: Lupita Raider M.D.   On: 07/20/2023 18:21   DG Elbow Complete Left Result Date: 07/20/2023 CLINICAL DATA:  Left elbow pain after fall. EXAM: LEFT ELBOW - COMPLETE 3+ VIEW COMPARISON:  None Available. FINDINGS: There is no evidence of fracture, dislocation, or joint effusion. There is no evidence of arthropathy or other focal bone abnormality. Soft tissues are unremarkable. IMPRESSION: Negative. Electronically Signed   By: Lupita Raider M.D.   On: 07/20/2023 18:20   DG Hip Unilat W or Wo Pelvis 2-3 Views Left Result Date: 07/20/2023 CLINICAL DATA:  Left hip pain after fall. EXAM: DG HIP (WITH OR WITHOUT PELVIS) 2-3V LEFT COMPARISON:  None Available. FINDINGS: Moderately displaced proximal left femoral neck fracture is noted. Moderate degenerative changes seen involving the right hip. IMPRESSION: Moderately displaced proximal left femoral neck fracture. Electronically Signed   By: Lupita Raider M.D.   On: 07/20/2023 18:19    Microbiology: Recent Results (from the  past 240 hours)  Surgical PCR screen     Status: None   Collection Time: 07/20/23 10:05 PM   Specimen: Nasal Mucosa; Nasal Swab  Result Value Ref Range Status   MRSA, PCR NEGATIVE NEGATIVE Final   Staphylococcus aureus NEGATIVE NEGATIVE Final    Comment: (NOTE) The Xpert SA Assay (FDA approved for NASAL specimens in patients 54 years of age and older), is one component of a comprehensive surveillance program. It is not intended to diagnose infection nor to guide or monitor treatment. Performed at [facility], [address] [place], [place] [address]      Labs: CBC: Recent Labs  Lab 07/26/23 [phone] 07/27/23 0532 07/28/23 0624 07/29/23 0500 07/30/23 0154  WBC 4.4 5.6 5.3 6.7 5.4  HGB 8.4* 9.2* 9.1* 9.5* 9.3*  HCT 25.3* 28.5* 28.2* 29.4* 29.2*  MCV 92.0 93.8 93.4 94.8 97.0  PLT 235 280 270 284 251   Basic Metabolic Panel: Recent Labs  Lab 07/25/23 0944 07/26/23 0519 07/27/23 0532 07/28/23 0624 07/29/23 0500 07/30/23 0154  NA 138 138 139 140 139 138  K 4.4 4.1 3.7 3.7 3.7 3.5  CL 102 104 106 110 108 107  CO2 27 26 25 25 24 24   GLUCOSE 117* 100* 103* 101* 101* 114*  BUN 16 17 14 11 13 18   CREATININE 0.76 0.75 0.68 0.60 0.64 0.67  CALCIUM 8.9 8.5* 8.7* 8.7* 8.6* 8.7*  MG 2.3 2.2 2.2 2.1  --   --   PHOS 3.8 4.0 3.5 3.6  --   --    Liver Function Tests: No results for input(s): "AST", "ALT", "ALKPHOS", "BILITOT", "PROT", "ALBUMIN" in the last 168 hours. No results for input(s): "LIPASE", "AMYLASE" in the last 168 hours. No results for input(s): "AMMONIA" in the last 168 hours. Cardiac Enzymes: No results for input(s): "CKTOTAL", "CKMB", "CKMBINDEX", "TROPONINI" in the last 168 hours. BNP (last 3 results) No results for input(s): "BNP" in the last 8760 hours. CBG: No results for input(s): "GLUCAP" in the last 168 hours.  Time spent: 35 minutes  Signed:  Gillis Santa  [facility] 07/30/2023 1:26 PM

## 2023-07-30 NOTE — Progress Notes (Addendum)
 Physical Therapy Treatment Patient Details Name: Sandra [name] MRN: [id number] DOB: 22-Jan-1946 Today's Date: 07/30/2023   History of Present Illness 78 y.o. Caucasian female with medical history significant for HFpEF, paroxysmal atrial fibrillation (used to be on Eliquis and having after having ICH it was stopped and is currently having watchman's device and loop recorder), GERD, anxiety, osteoarthritis, depression, essential hypertension, hypothyroidism, nonobstructive coronary artery disease and prediabetes, RLS and CVA with residual problems with balance, who presented to the emergency room with acute onset of fall at a restaurant when she was trying to get up and lost her balance falling on the left side.  Pt is s/p L hemiarthroplasty on 07/21/23.    PT Comments  Up and walks x 3 laps on unit and completes stair training.  She is able to manage in bathroom and attend to her own care and clothing without assist.  Pt needed no physical assist or intervention today during session with no LOB or buckling noted.  Long discussion regarding discharge plan.  She continues to wait for insurance authorization and has made continued progress with mobility over the week-end.  She is considering returning home with [facility] services and continued support from family and friends.  Will reach out to [facility] for follow up.   If plan is discharge home, recommend the following: A little help with walking and/or transfers;A little help with bathing/dressing/bathroom;Assistance with cooking/housework;Direct supervision/assist for medications management;Direct supervision/assist for financial management;Assist for transportation;Help with stairs or ramp for entrance   Can travel by private vehicle        Equipment Recommendations  Rolling walker (2 wheels);BSC/3in1    Recommendations for Other Services       Precautions / Restrictions Restrictions Weight Bearing Restrictions Per Provider Order: Yes LLE Weight Bearing  Per Provider Order: Partial weight bearing LLE Partial Weight Bearing Percentage or Pounds: 50     Mobility  Bed Mobility               General bed mobility comments: in chair before and after Patient Response: Cooperative  Transfers Overall transfer level: Needs assistance Equipment used: Rolling walker (2 wheels) Transfers: Sit to/from Stand Sit to Stand: Modified independent (Device/Increase time), Supervision                Ambulation/Gait Ambulation/Gait assistance: Supervision Gait Distance (Feet): 800 Feet Assistive device: Rolling walker (2 wheels) Gait Pattern/deviations: Antalgic, Step-through pattern Gait velocity: Decreased     General Gait Details: initially with a limp that improves with time   Stairs   Stairs assistance: Contact guard assist Stair Management: Two rails, Step to pattern Number of Stairs: 4     Wheelchair Mobility     Tilt Bed Tilt Bed Patient Response: Cooperative  Modified Rankin (Stroke Patients Only)       Balance Overall balance assessment: Needs assistance, Mild deficits observed, not formally tested Sitting-balance support: Feet supported Sitting balance-Leahy Scale: Good     Standing balance support: Bilateral upper extremity supported, During functional activity, Reliant on assistive device for balance Standing balance-Leahy Scale: Good Standing balance comment: able to manage clothing and pericare without assist                            Communication    Cognition Arousal: Alert     PT - Cognitive impairments: No apparent impairments  Cueing    Exercises      General Comments        Pertinent Vitals/Pain Pain Assessment Pain Assessment: Faces Faces Pain Scale: Hurts a little bit Pain Location: L Hip Pain Descriptors / Indicators: Burning, Sore Pain Intervention(s): Limited activity within patient's tolerance, Monitored during  session, Repositioned    Home Living                          Prior Function            PT Goals (current goals can now be found in the care plan section) Progress towards PT goals: Progressing toward goals    Frequency    7X/week      PT Plan      Co-evaluation              AM-PAC PT "6 Clicks" Mobility   Outcome Measure  Help needed turning from your back to your side while in a flat bed without using bedrails?: None Help needed moving from lying on your back to sitting on the side of a flat bed without using bedrails?: A Little Help needed moving to and from a bed to a chair (including a wheelchair)?: None   Help needed to walk in hospital room?: A Little Help needed climbing 3-5 steps with a railing? : A Little 6 Click Score: 17    End of Session Equipment Utilized During Treatment: Gait belt Activity Tolerance: Patient tolerated treatment well Patient left: in chair;with call bell/phone within reach;with chair alarm set Nurse Communication: Mobility status PT Visit Diagnosis: Unsteadiness on feet (R26.81);Other abnormalities of gait and mobility (R26.89);Repeated falls (R29.6);Muscle weakness (generalized) (M62.81);History of falling (Z91.81);Ataxic gait (R26.0);Difficulty in walking, not elsewhere classified (R26.2);Dizziness and giddiness (R42)     Time: 0935-1000 PT Time Calculation (min) (ACUTE ONLY): 25 min  Charges:    $Gait Training: 8-22 mins $Therapeutic Activity: 8-22 mins PT General Charges $$ ACUTE PT VISIT: 1 Visit                   Danielle Dess, PTA 07/30/23, 10:07 AM

## 2023-07-30 NOTE — Discharge Instructions (Signed)
 [facility] They will call you to set up when they will come to your home   [facility] Address: [address], [place], [place] [address] Hours:  Open ? Closes 4?PM Phone: [phone]

## 2023-07-30 NOTE — Plan of Care (Signed)

## 2023-07-30 NOTE — TOC Progression Note (Signed)
 Transition of Care [facility]) - Progression Note    Patient Details  Name: Sandra [name] MRN: [id number] Date of Birth: [birth year]  Transition of Care [facility]) CM/SW Contact  Marlowe Sax, RN Phone Number: 07/30/2023, 4:04 PM  Clinical Narrative:    [facility] for [facility] services,         Expected Discharge Plan and Services         Expected Discharge Date: 07/30/23                                     Social Determinants of Health (SDOH) Interventions SDOH Screenings   Food Insecurity: No Food Insecurity (07/20/2023)  Housing: Low Risk  (07/20/2023)  Transportation Needs: Unmet Transportation Needs (07/20/2023)  Utilities: At Risk (07/20/2023)  Alcohol Screen: Low Risk  (06/22/2021)  Depression (PHQ2-9): Low Risk  (06/22/2021)  Financial Resource Strain: Low Risk  (05/18/2021)  Physical Activity: Insufficiently Active (05/18/2021)  Social Connections: Socially Integrated (07/20/2023)  Stress: No Stress Concern Present (05/18/2021)  Tobacco Use: Low Risk  (07/20/2023)    Readmission Risk Interventions     No data to display

## 2023-07-30 NOTE — TOC Progression Note (Addendum)
 Transition of Care [facility]) - Progression Note    Patient Details  Name: Sandra [name] MRN: [id number] Date of Birth: 09/24/1945  Transition of [facility]) CM/SW Contact  Marlowe Sax, RN Phone Number: 07/30/2023, 1:57 PM  Clinical Narrative:     [facility] is not able to accept for [facility], [facility] is not able to accept for [facility], [facility] is not able to accept for [facility], [facility] is not able to accept for [facility], Waiting to hear back from [facility],   I have left 2 voice mails and reached out via text asking for a phone call to see if they can accept this patient     Expected Discharge Plan and Services         Expected Discharge Date: 07/30/23                                     Social Determinants of Health (SDOH) Interventions SDOH Screenings   Food Insecurity: No Food Insecurity (07/20/2023)  Housing: Low Risk  (07/20/2023)  Transportation Needs: Unmet Transportation Needs (07/20/2023)  Utilities: At Risk (07/20/2023)  Alcohol Screen: Low Risk  (06/22/2021)  Depression (PHQ2-9): Low Risk  (06/22/2021)  Financial Resource Strain: Low Risk  (05/18/2021)  Physical Activity: Insufficiently Active (05/18/2021)  Social Connections: Socially Integrated (07/20/2023)  Stress: No Stress Concern Present (05/18/2021)  Tobacco Use: Low Risk  (07/20/2023)    Readmission Risk Interventions     No data to display

## 2023-08-01 ENCOUNTER — Telehealth: Payer: Self-pay | Admitting: Internal Medicine

## 2023-08-01 NOTE — Telephone Encounter (Signed)
 Pt c/o BP issue: STAT if pt c/o blurred vision, one-sided weakness or slurred speech.  STAT if BP is GREATER than 180/120 TODAY.  STAT if BP is LESS than 90/60 and SYMPTOMATIC TODAY  1. What is your BP concern?  Elevated BP readings and medication has been increased  2. Have you taken any BP medication today?  Unknown (daughter is in [place])  3. What are your last 5 BP readings?  3/3  168/78        154/75  4. Are you having any other symptoms (ex. Dizziness, headache, blurred vision, passed out)?    Daughter [name]) stated patient was in the hospital and has been having elevated BP.  Daughter is concerned patient's medication have been increased and her home BP machine may not be accurate.  Daughter is concerned patient was released from the hospital back to the higher medication dosage.  Daughter wants a call back to discuss next steps.

## 2023-08-01 NOTE — Telephone Encounter (Signed)
 I was not involved in the patient's care during her recent hospitalization.  However, review of her vital signs during the last day of admission indicates that blood pressures were actually moderately elevated.  Based on those readings, I think losartan 75 mg daily is appropriate.  However, I think it is prudent to monitor blood pressure at home and to alert [place] if readings are low (less than 100/60).  Yvonne Kendall, MD [facility]

## 2023-08-01 NOTE — Telephone Encounter (Signed)
 Spoke with daughter. Daughter reports that the patient was hospitalized on 07/20/23 for a hip fracture. During hospitalization she developed swelling and received lasix and then Albumin. Daughter reports that the patient's systolic blood pressure decreased to the 80's while in the hospital so her Losartan was held for a few days. Daughter reports that her mother was restarted on Losartan 25 MG on 07/26/23 and then increased to 50 MG on 07/28/23. Patient was discharged on 07/30/23 and at the time of discharge was told to restart Losartan 75 MG. The patient has not been able to check any blood pressure readings at home. Patient will be receiving a new blood pressure cuff tomorrow. Daughter is concerned that the 75 MG of Losartan will be decrease her mother's blood pressure too low and wants the cardiologist opinion. The daughter is also concerned that Hydralazine PRN has been added to the patient medication since she has never taken it before. Asked the daughter to start monitoring blood pressures.

## 2023-08-02 NOTE — Telephone Encounter (Signed)
 Spoke with patient and notified her of the following from Dr. Okey Dupre.  I was not involved in the patient's care during her recent hospitalization.  However, review of her vital signs during the last day of admission indicates that blood pressures were actually moderately elevated.  Based on those readings, I think losartan 75 mg daily is appropriate.  However, I think it is prudent to monitor blood pressure at home and to alert [place] if readings are low (less than 100/60).    Patient verbalizes understanding.

## 2023-08-02 NOTE — Telephone Encounter (Signed)
 Called daughter [name] per [place]. Left a detailed message with the following from Dr. Okey Dupre.  I was not involved in the patient's care during her recent hospitalization.  However, review of her vital signs during the last day of admission indicates that blood pressures were actually moderately elevated.  Based on those readings, I think losartan 75 mg daily is appropriate.  However, I think it is prudent to monitor blood pressure at home and to alert [place] if readings are low (less than 100/60).   Yvonne Kendall, MD [facility]     Asked daughter to call back if she has any questions.

## 2023-08-14 ENCOUNTER — Telehealth: Payer: Self-pay

## 2023-08-14 NOTE — Telephone Encounter (Signed)
 Alert received from [employer] for first pause event.  Will call pt to assess for symptoms.

## 2023-08-14 NOTE — Telephone Encounter (Signed)
 Patient called, was asymptomatic. Possibly was asleep during the pause.   Patient goes to sleep around 10:30 pm and wakes up around 7:00 am.   Pt. Advised to call in the future if any symptoms arise.

## 2023-08-15 ENCOUNTER — Encounter: Payer: Self-pay | Admitting: Specialist

## 2023-08-24 ENCOUNTER — Telehealth: Payer: Self-pay

## 2023-08-24 NOTE — Telephone Encounter (Signed)
 Alert remote transmission:  AT/AF Daily Burden > Threshold AF in progress from 3/27 @ 17:53, poor rate control, burden 55.5%, watchman per PA report Route to triage per protocol Follow up as scheduled. [place], CVRS  Pt fell 2/21 and broke hip. Had surgery the next day. Has a watchman. AF is not hindering her as much as it used to. She c/o lightheadedness during times of changing position as well as unprovoked. No syncope. Expresses frustration with how confusing this all is. Sending to MD for review.

## 2023-08-27 ENCOUNTER — Ambulatory Visit (INDEPENDENT_AMBULATORY_CARE_PROVIDER_SITE_OTHER): Payer: Medicare (Managed Care)

## 2023-08-27 DIAGNOSIS — R55 Syncope and collapse: Secondary | ICD-10-CM | POA: Diagnosis not present

## 2023-08-27 NOTE — Telephone Encounter (Signed)
 Spoke with the patient and advised on recommendations for patient to be seen in AFIB clinic. Patient is unable to get to [facility] right now for appointments. She had hip surgery and cannot drive so has to rely on someone to take her. She would prefer an appointment in [place]. She is scheduled to see Sherie Don, NP on Thursday 4/3.

## 2023-08-28 LAB — CUP PACEART REMOTE DEVICE CHECK
Date Time Interrogation Session: 20250330231806
Implantable Pulse Generator Implant Date: 20240522

## 2023-08-29 NOTE — Addendum Note (Signed)
 Addended by: Geralyn Flash D on: 08/29/2023 10:46 AM   Modules accepted: Orders

## 2023-08-29 NOTE — Progress Notes (Signed)
 [facility] Summary Report / Loop Recorder

## 2023-08-30 ENCOUNTER — Ambulatory Visit: Payer: Medicare (Managed Care) | Attending: Cardiology | Admitting: Cardiology

## 2023-08-30 ENCOUNTER — Encounter: Payer: Self-pay | Admitting: Cardiology

## 2023-08-30 VITALS — BP 122/84 | HR 112 | Wt 127.6 lb

## 2023-08-30 DIAGNOSIS — I10 Essential (primary) hypertension: Secondary | ICD-10-CM | POA: Diagnosis not present

## 2023-08-30 DIAGNOSIS — Z79899 Other long term (current) drug therapy: Secondary | ICD-10-CM | POA: Diagnosis not present

## 2023-08-30 DIAGNOSIS — I4891 Unspecified atrial fibrillation: Secondary | ICD-10-CM | POA: Diagnosis not present

## 2023-08-30 DIAGNOSIS — I495 Sick sinus syndrome: Secondary | ICD-10-CM

## 2023-08-30 DIAGNOSIS — R296 Repeated falls: Secondary | ICD-10-CM | POA: Diagnosis not present

## 2023-08-30 MED ORDER — APIXABAN 5 MG PO TABS: 5.0000 mg | ORAL_TABLET | Freq: Two times a day (BID) | ORAL | 0 refills | Status: DC

## 2023-08-30 MED ORDER — APIXABAN 5 MG PO TABS: 5.0000 mg | ORAL_TABLET | Freq: Two times a day (BID) | ORAL | 1 refills | Status: DC

## 2023-08-30 NOTE — H&P (View-Only) (Signed)
 Electrophysiology Clinic Note    Date:  08/30/2023  Patient ID:  Sandra [name] Jan 08, 1946, MRN [id number] PCP:  Louis Matte, MD  Cardiologist:  Yvonne Kendall, MD Electrophysiologist: Lanier Prude, MD   Discussed the use of AI scribe software for clinical note transcription with the patient, who gave verbal consent to proceed.   Patient Profile    Chief Complaint: afib follow-up  History of Present Illness: Sandra [name] is a 78 y.o. female with PMH notable for parox AFib, tachy-brady, s/p LAAO (Watchman), frequent falls; seen today for Lanier Prude, MD for acute visit due to increased AFib.    She is s/p ILR implant for ongoing afib surveillance and falls.   She last saw Dr. Lalla Brothers 06/2023. She has continued to have falls without appreciable pauses on her ILR. Falls likely 2/2 gait instability and prior stroke. Afib burden overall low, she was continued on 100mg  amiodarone daily and PRN lopressor.  She had fall 2/21 where she required hip surgery the following day.   Device clinic was alerted late March 2025 for increased AFib burden.   On follow-up today, she is continuing to heal and recover from hip surgery, doing PT. She has been on 81mg  ASA BID since hip surgery, will reduce to 81mg  daily on 4/7. She has noticed that her heart rate is elevated when she checks her BP, but she has not  felt as poorly as she usually does when in AFib. She does have intermittent, fleeting palpitation sensation.   Her daughter joins visit via telephone. Her friend/driver joins in person.     Arrhythmia/Device History MDT ILR, imp 09/2022; dx afib monitoring   AAD -  Amiodarone    ROS:  Please see the history of present illness. All other systems are reviewed and otherwise negative.    Physical Exam    VS:  BP 122/84   Pulse (!) 112   Wt 127 lb 9.6 oz (57.9 kg)   SpO2 98%   BMI 22.60 kg/m  BMI: Body mass index is 22.6 kg/m.  Wt Readings from Last 3  Encounters:  08/30/23 127 lb 9.6 oz (57.9 kg)  07/20/23 134 lb 11.2 oz (61.1 kg)  07/11/23 124 lb (56.2 kg)     GEN- The patient is well appearing, alert and oriented x 3 today.   Lungs- Clear to ausculation bilaterally, normal work of breathing.  Heart- Irregularly irregular rate and rhythm, no murmurs, rubs or gallops Extremities- Trace L > Rperipheral edema, warm, dry Skin-  ILR insertion site well-healed.    3/30 remote interrogation reviewed -  Significantly elevated AT/AF burden Do not appreciate the end of episodes Elevated ventricular rates   Studies Reviewed   Previous EP, cardiology notes.    EKG is ordered. Personal review of EKG from today shows:    EKG Interpretation Date/Time:  Thursday August 30 2023 13:58:20 EDT Ventricular Rate:  112 PR Interval:    QRS Duration:  94 QT Interval:  308 QTC Calculation: 420 R Axis:   26  Text Interpretation: Atrial flutter with variable A-V block Left ventricular hypertrophy with repolarization abnormality ( Sokolow-Lyon , Cornell product ) Confirmed by Sherie Don [phone]) on 08/30/2023 2:01:54 PM     TE, 04/22/2022  1. Left ventricular ejection fraction, by estimation, is 60 to 65%. The left ventricle has normal function. The left ventricle has no regional wall motion abnormalities. There is mild left ventricular hypertrophy. Left ventricular diastolic parameters are  indeterminate.   2. Right ventricular systolic function is normal. The right ventricular size is normal.   3. Left atrial size was moderately dilated.   4. The mitral valve is normal in structure. Mild mitral valve regurgitation.   5. The aortic valve is tricuspid. Aortic valve regurgitation is not visualized.   6. The inferior vena cava is normal in size with greater than 50% respiratory variability, suggesting right atrial pressure of 3 mmHg.    TEE, 03/10/2021 1. 24 mm Watchman FLX left atrial appendage occluder well seated, no residual flow. . Left atrial  size was moderately dilated. No left atrial/left atrial appendage thrombus was detected.   2. Left ventricular ejection fraction, by estimation, is 60 to 65%. The left ventricle has normal function.   3. Right ventricular systolic function is normal. The right ventricular size is normal.   4. Right atrial size was moderately dilated.   5. The mitral valve is grossly normal.   6. The aortic valve is normal in structure. Aortic valve regurgitation is trivial.   7. There is mild (Grade II) atheroma plaque involving the transverse aorta.   8. Evidence of atrial level shunting detected by color flow Doppler. There is a small iatrogenic mid septal atrial septal defect with predominantly left to right shunting across the atrial septum.     Assessment and Plan     #) parox AFib #) atrial flutter Historically low burden of afib on 100mg  amiodarone daily Significant increase in burden for last several weeks, query persistent episode Recommended DCCV, patient and daughter verbalized understanding and wished to proceed Continue 100mg  amiodarone daily Continue 12.5mg  lopressor PRN for sustained HR above 120 Update amio labs and pre-procedure labs today   #) Hypercoag d/t parox afib #) s/p LAAO with Watchman device TEE shows watchman is well-seated without leak Discussed with Dr. Lalla Brothers, who recommended [facility] for 3 days prior to DCCV, and continue for 1 month post-DCCV Start 5mg  eliquis BID for CHA2DS2-VASc Score = at least 5 [CHF History: 0, HTN History: 1, Diabetes History: 0, Stroke History: 0, Vascular Disease History: 1, Age Score: 2, Gender Score: 1].  Therefore, the patient's annual risk of stroke is 7.2 %.    She will continue 81mg  ASA BID as per ortho through 4/6. On 4/7, she will start 5mg  eliquis BID      Informed Consent   Shared Decision Making/Informed Consent The risks (stroke, cardiac arrhythmias rarely resulting in the need for a temporary or permanent pacemaker, skin  irritation or burns and complications associated with conscious sedation including aspiration, arrhythmia, respiratory failure and death), benefits (restoration of normal sinus rhythm) and alternatives of a direct current cardioversion were explained in detail to Ms. Doolittle and she agrees to proceed.        Current medicines are reviewed at length with the patient today.   The patient does not have concerns regarding her medicines.  The following changes were made today:   Stop ASA on 4/7 and start 5mg  eliquis BID on 4/7.   Labs/ tests ordered today include:  Orders Placed This Encounter  Procedures   T4, free   TSH   Comprehensive metabolic panel with GFR   CBC   EKG 12-Lead     Disposition: Follow up with Dr. Lalla Brothers or EP APP as usual post procedure   Signed, Sherie Don, NP  08/30/23  2:54 PM  Electrophysiology [facility]

## 2023-08-30 NOTE — Patient Instructions (Addendum)
 Medication Instructions: Continue Aspirin until Sunday April 6 th.  Starting Monday April 7th, Start Eliquis 5 mg ( Take 1 Tablet Twice Daily Continue Post Cardio Version for 1 month).  *If you need a refill on your cardiac medications before your next appointment, please call your pharmacy*  Lab Work: CMET, CBC, TSH, T4 Free If you have labs (blood work) drawn today and your tests are completely normal, you will receive your results only by: MyChart Message (if you have MyChart) OR A paper copy in the mail If you have any lab test that is abnormal or we need to change your treatment, we will call you to review the results.  Testing/Procedures:     Dear Sandra [name]  You are scheduled for a Cardioversion on Thursday, April 10 with Dr. Julien Nordmann.  Please arrive at the [facility] Entrance of [facility], [address], [place] [address] at 6:30 AM (This is 1 hour(s) prior to your procedure time).  Proceed to the Check-In Desk directly inside the entrance.  Procedure Parking: Use the entrance off of the [address] side of the hospital. Turn right upon entering and follow the driveway to parking that is directly in front of the [facility] Vascular Center. There is no valet parking available at this entrance, however there is an awning directly in front of the Heart & Vascular Center for drop off/ pick up for patients.    DIET:  Nothing to eat or drink after midnight except a sip of water with medications (see medication instructions below)  MEDICATION INSTRUCTIONS: !!IF ANY NEW MEDICATIONS ARE STARTED AFTER TODAY, PLEASE NOTIFY YOUR PROVIDER AS SOON AS POSSIBLE!!  FYI: Medications such as Semaglutide (Ozempic, [place]), Tirzepatide (Mounjaro, Zepbound), Dulaglutide (Trulicity), etc ("GLP1 agonists") AND Canagliflozin (Invokana), Dapagliflozin (Farxiga), Empagliflozin (Jardiance), Ertugliflozin (Steglatro), Bexagliflozin [employer]) or any combination with one of these drugs  such as Invokamet (Canagliflozin/Metformin), Synjardy (Empagliflozin/Metformin), etc ("SGLT2 inhibitors") must be held around the time of a procedure. This is not a comprehensive list of all of these drugs. Please review all of your medications and talk to your provider if you take any one of these. If you are not sure, ask your provider.     Continue taking your anticoagulant (blood thinner): Apixaban (Eliquis).  You will need to continue this after your procedure until you are told by your provider that it is safe to stop.    LABS:   Come to the lab at [facility] at [address] between the hours of 8:00 am and 4:30 pm. You do NOT have to be fasting.  FYI:  For your safety, and to allow [place] to monitor your vital signs accurately during the surgery/procedure we request: If you have artificial nails, gel coating, SNS etc, please have those removed prior to your surgery/procedure. Not having the nail coverings /polish removed may result in cancellation or delay of your surgery/procedure.  Your support person will be asked to wait in the waiting room during your procedure.  It is OK to have someone drop you off and come back when you are ready to be discharged.  You cannot drive after the procedure and will need someone to drive you home.  Bring your insurance cards.  *Special Note: Every effort is made to have your procedure done on time. Occasionally there are emergencies that occur at the hospital that may cause delays. Please be patient if a delay does occur.  Follow-Up: At [facility], you and your health needs are our priority.  As part of our continuing mission to provide you with exceptional heart care, our providers are all part of one team.  This team includes your primary Cardiologist (physician) and Advanced Practice Providers or APPs (Physician Assistants and Nurse Practitioners) who all work together to provide you with the care you need, when you  need it.  Your next appointment:   1-2 week(s) Post Cardio Version  Provider:   Sherie Don, NP   We recommend signing up for the patient portal called "MyChart".  Sign up information is provided on this After Visit Summary.  MyChart is used to connect with patients for Virtual Visits (Telemedicine).  Patients are able to view lab/test results, encounter notes, upcoming appointments, etc.  Non-urgent messages can be sent to your provider as well.   To learn more about what you can do with MyChart, go to [email or web address].

## 2023-08-30 NOTE — Progress Notes (Signed)
 Electrophysiology Clinic Note    Date:  08/30/2023  Patient ID:  Geovanna, Simko Jan 08, 1946, MRN [id number] PCP:  Louis Matte, MD  Cardiologist:  Yvonne Kendall, MD Electrophysiologist: Lanier Prude, MD   Discussed the use of AI scribe software for clinical note transcription with the patient, who gave verbal consent to proceed.   Patient Profile    Chief Complaint: afib follow-up  History of Present Illness: Sandra [name] is a 78 y.o. female with PMH notable for parox AFib, tachy-brady, s/p LAAO (Watchman), frequent falls; seen today for Lanier Prude, MD for acute visit due to increased AFib.    She is s/p ILR implant for ongoing afib surveillance and falls.   She last saw Dr. Lalla Brothers 06/2023. She has continued to have falls without appreciable pauses on her ILR. Falls likely 2/2 gait instability and prior stroke. Afib burden overall low, she was continued on 100mg  amiodarone daily and PRN lopressor.  She had fall 2/21 where she required hip surgery the following day.   Device clinic was alerted late March 2025 for increased AFib burden.   On follow-up today, she is continuing to heal and recover from hip surgery, doing PT. She has been on 81mg  ASA BID since hip surgery, will reduce to 81mg  daily on 4/7. She has noticed that her heart rate is elevated when she checks her BP, but she has not  felt as poorly as she usually does when in AFib. She does have intermittent, fleeting palpitation sensation.   Her daughter joins visit via telephone. Her friend/driver joins in person.     Arrhythmia/Device History MDT ILR, imp 09/2022; dx afib monitoring   AAD -  Amiodarone    ROS:  Please see the history of present illness. All other systems are reviewed and otherwise negative.    Physical Exam    VS:  BP 122/84   Pulse (!) 112   Wt 127 lb 9.6 oz (57.9 kg)   SpO2 98%   BMI 22.60 kg/m  BMI: Body mass index is 22.6 kg/m.  Wt Readings from Last 3  Encounters:  08/30/23 127 lb 9.6 oz (57.9 kg)  07/20/23 134 lb 11.2 oz (61.1 kg)  07/11/23 124 lb (56.2 kg)     GEN- The patient is well appearing, alert and oriented x 3 today.   Lungs- Clear to ausculation bilaterally, normal work of breathing.  Heart- Irregularly irregular rate and rhythm, no murmurs, rubs or gallops Extremities- Trace L > Rperipheral edema, warm, dry Skin-  ILR insertion site well-healed.    3/30 remote interrogation reviewed -  Significantly elevated AT/AF burden Do not appreciate the end of episodes Elevated ventricular rates   Studies Reviewed   Previous EP, cardiology notes.    EKG is ordered. Personal review of EKG from today shows:    EKG Interpretation Date/Time:  Thursday August 30 2023 13:58:20 EDT Ventricular Rate:  112 PR Interval:    QRS Duration:  94 QT Interval:  308 QTC Calculation: 420 R Axis:   26  Text Interpretation: Atrial flutter with variable A-V block Left ventricular hypertrophy with repolarization abnormality ( Sokolow-Lyon , Cornell product ) Confirmed by Sherie Don [phone]) on 08/30/2023 2:01:54 PM     TE, 04/22/2022  1. Left ventricular ejection fraction, by estimation, is 60 to 65%. The left ventricle has normal function. The left ventricle has no regional wall motion abnormalities. There is mild left ventricular hypertrophy. Left ventricular diastolic parameters are  indeterminate.   2. Right ventricular systolic function is normal. The right ventricular size is normal.   3. Left atrial size was moderately dilated.   4. The mitral valve is normal in structure. Mild mitral valve regurgitation.   5. The aortic valve is tricuspid. Aortic valve regurgitation is not visualized.   6. The inferior vena cava is normal in size with greater than 50% respiratory variability, suggesting right atrial pressure of 3 mmHg.    TEE, 03/10/2021 1. 24 mm Watchman FLX left atrial appendage occluder well seated, no residual flow. . Left atrial  size was moderately dilated. No left atrial/left atrial appendage thrombus was detected.   2. Left ventricular ejection fraction, by estimation, is 60 to 65%. The left ventricle has normal function.   3. Right ventricular systolic function is normal. The right ventricular size is normal.   4. Right atrial size was moderately dilated.   5. The mitral valve is grossly normal.   6. The aortic valve is normal in structure. Aortic valve regurgitation is trivial.   7. There is mild (Grade II) atheroma plaque involving the transverse aorta.   8. Evidence of atrial level shunting detected by color flow Doppler. There is a small iatrogenic mid septal atrial septal defect with predominantly left to right shunting across the atrial septum.     Assessment and Plan     #) parox AFib #) atrial flutter Historically low burden of afib on 100mg  amiodarone daily Significant increase in burden for last several weeks, query persistent episode Recommended DCCV, patient and daughter verbalized understanding and wished to proceed Continue 100mg  amiodarone daily Continue 12.5mg  lopressor PRN for sustained HR above 120 Update amio labs and pre-procedure labs today   #) Hypercoag d/t parox afib #) s/p LAAO with Watchman device TEE shows watchman is well-seated without leak Discussed with Dr. Lalla Brothers, who recommended [facility] for 3 days prior to DCCV, and continue for 1 month post-DCCV Start 5mg  eliquis BID for CHA2DS2-VASc Score = at least 5 [CHF History: 0, HTN History: 1, Diabetes History: 0, Stroke History: 0, Vascular Disease History: 1, Age Score: 2, Gender Score: 1].  Therefore, the patient's annual risk of stroke is 7.2 %.    She will continue 81mg  ASA BID as per ortho through 4/6. On 4/7, she will start 5mg  eliquis BID      Informed Consent   Shared Decision Making/Informed Consent The risks (stroke, cardiac arrhythmias rarely resulting in the need for a temporary or permanent pacemaker, skin  irritation or burns and complications associated with conscious sedation including aspiration, arrhythmia, respiratory failure and death), benefits (restoration of normal sinus rhythm) and alternatives of a direct current cardioversion were explained in detail to Sandra [name] and she agrees to proceed.        Current medicines are reviewed at length with the patient today.   The patient does not have concerns regarding her medicines.  The following changes were made today:   Stop ASA on 4/7 and start 5mg  eliquis BID on 4/7.   Labs/ tests ordered today include:  Orders Placed This Encounter  Procedures   T4, free   TSH   Comprehensive metabolic panel with GFR   CBC   EKG 12-Lead     Disposition: Follow up with Dr. Lalla Brothers or EP APP as usual post procedure   Signed, Sherie Don, NP  08/30/23  2:54 PM  Electrophysiology [facility]

## 2023-08-31 ENCOUNTER — Telehealth: Payer: Self-pay | Admitting: Cardiology

## 2023-08-31 ENCOUNTER — Other Ambulatory Visit: Payer: Self-pay

## 2023-08-31 LAB — CBC
Hematocrit: 35.1 % (ref 34.0–46.6)
Hemoglobin: 11.1 g/dL (ref 11.1–15.9)
MCH: 30.4 pg (ref 26.6–33.0)
MCHC: 31.6 g/dL (ref 31.5–35.7)
MCV: 96 fL (ref 79–97)
Platelets: 224 10*3/uL (ref 150–450)
RBC: 3.65 x10E6/uL — ABNORMAL LOW (ref 3.77–5.28)
RDW: 13.6 % (ref 11.7–15.4)
WBC: 4.2 10*3/uL (ref 3.4–10.8)

## 2023-08-31 LAB — COMPREHENSIVE METABOLIC PANEL WITH GFR
ALT: 16 IU/L (ref 0–32)
AST: 21 IU/L (ref 0–40)
Albumin: 3.9 g/dL (ref 3.8–4.8)
Alkaline Phosphatase: 110 IU/L (ref 44–121)
BUN/Creatinine Ratio: 20 (ref 12–28)
BUN: 14 mg/dL (ref 8–27)
Bilirubin Total: 0.4 mg/dL (ref 0.0–1.2)
CO2: 22 mmol/L (ref 20–29)
Calcium: 8.8 mg/dL (ref 8.7–10.3)
Chloride: 104 mmol/L (ref 96–106)
Creatinine, Ser: 0.71 mg/dL (ref 0.57–1.00)
Globulin, Total: 2.2 g/dL (ref 1.5–4.5)
Glucose: 89 mg/dL (ref 70–99)
Potassium: 4 mmol/L (ref 3.5–5.2)
Sodium: 141 mmol/L (ref 134–144)
Total Protein: 6.1 g/dL (ref 6.0–8.5)
eGFR: 87 mL/min/{1.73_m2} (ref >59)

## 2023-08-31 LAB — TSH: TSH: 4.45 u[IU]/mL (ref 0.450–4.500)

## 2023-08-31 LAB — T4, FREE: Free T4: 1.5 ng/dL (ref 0.82–1.77)

## 2023-08-31 MED ORDER — APIXABAN 5 MG PO TABS: 5.0000 mg | ORAL_TABLET | Freq: Two times a day (BID) | ORAL | 0 refills | Status: DC

## 2023-08-31 NOTE — Telephone Encounter (Signed)
*  STAT* If patient is at the pharmacy, call can be transferred to refill team.   1. Which medications need to be refilled? (please list name of each medication and dose if known) new prescription for Eliquis to her local pharmacy- until her mail order arrives   2. Would you like to learn more about the convenience, safety, & potential cost savings by using the [facility]?      3. Are you open to using the [facility] (Type [facility]).   4. Which pharmacy/location (including street and city if local pharmacy) is medication to be sent to?[facility]  [employer] [place]   5. Do they need a 30 day or 90 day supply? 1 week#14- please call today- need to start Monday morning

## 2023-08-31 NOTE — Telephone Encounter (Signed)
 Prescription refill request for Eliquis received. Indication:afib Last office visit:4/25 Scr:0.71  4/25 Age: 78 Weight:57.9  kg  Prescription refilled

## 2023-09-01 ENCOUNTER — Encounter: Payer: Self-pay | Admitting: Cardiology

## 2023-09-04 ENCOUNTER — Telehealth: Payer: Self-pay | Admitting: Cardiology

## 2023-09-04 NOTE — Telephone Encounter (Signed)
 Calling about her procedure she is having and what medication she can and can't take. Please advise

## 2023-09-04 NOTE — Telephone Encounter (Signed)
 Called patient, advised of instructions for DCCV.  Patient verbalized understanding.

## 2023-09-05 MED ORDER — SODIUM CHLORIDE 0.9 % IV SOLN: INTRAVENOUS | Status: DC

## 2023-09-06 ENCOUNTER — Encounter
Admission: RE | Disposition: A | Discharge status: Home / Self Care | Payer: Self-pay | Source: Home / Self Care | Attending: Cardiovascular Disease

## 2023-09-06 ENCOUNTER — Encounter: Payer: Self-pay | Admitting: Cardiovascular Disease

## 2023-09-06 ENCOUNTER — Ambulatory Visit: Payer: Medicare (Managed Care) | Admitting: General Practice

## 2023-09-06 ENCOUNTER — Ambulatory Visit (HOSPITAL_BASED_OUTPATIENT_CLINIC_OR_DEPARTMENT_OTHER)
Admission: RE | Admit: 2023-09-06 | Discharge: 2023-09-06 | Disposition: A | Discharge status: Home / Self Care | Payer: Medicare (Managed Care) | Source: Home / Self Care | Attending: Cardiovascular Disease | Admitting: Cardiovascular Disease

## 2023-09-06 DIAGNOSIS — Z79899 Other long term (current) drug therapy: Secondary | ICD-10-CM | POA: Insufficient documentation

## 2023-09-06 DIAGNOSIS — Z7982 Long term (current) use of aspirin: Secondary | ICD-10-CM | POA: Insufficient documentation

## 2023-09-06 DIAGNOSIS — I4891 Unspecified atrial fibrillation: Secondary | ICD-10-CM | POA: Diagnosis not present

## 2023-09-06 DIAGNOSIS — Z95818 Presence of other cardiac implants and grafts: Secondary | ICD-10-CM | POA: Insufficient documentation

## 2023-09-06 DIAGNOSIS — I480 Paroxysmal atrial fibrillation: Secondary | ICD-10-CM | POA: Insufficient documentation

## 2023-09-06 DIAGNOSIS — J189 Pneumonia, unspecified organism: Secondary | ICD-10-CM | POA: Diagnosis not present

## 2023-09-06 DIAGNOSIS — I509 Heart failure, unspecified: Secondary | ICD-10-CM | POA: Insufficient documentation

## 2023-09-06 DIAGNOSIS — Z7901 Long term (current) use of anticoagulants: Secondary | ICD-10-CM | POA: Insufficient documentation

## 2023-09-06 DIAGNOSIS — I6930 Unspecified sequelae of cerebral infarction: Secondary | ICD-10-CM | POA: Insufficient documentation

## 2023-09-06 DIAGNOSIS — Z9181 History of falling: Secondary | ICD-10-CM | POA: Insufficient documentation

## 2023-09-06 DIAGNOSIS — R0602 Shortness of breath: Secondary | ICD-10-CM

## 2023-09-06 DIAGNOSIS — E039 Hypothyroidism, unspecified: Secondary | ICD-10-CM | POA: Insufficient documentation

## 2023-09-06 DIAGNOSIS — I110 Hypertensive heart disease with heart failure: Secondary | ICD-10-CM | POA: Insufficient documentation

## 2023-09-06 DIAGNOSIS — I4892 Unspecified atrial flutter: Secondary | ICD-10-CM | POA: Insufficient documentation

## 2023-09-06 DIAGNOSIS — I495 Sick sinus syndrome: Secondary | ICD-10-CM | POA: Insufficient documentation

## 2023-09-06 HISTORY — PX: CARDIOVERSION: SHX1299

## 2023-09-06 SURGERY — CARDIOVERSION
Anesthesia: General

## 2023-09-06 MED ORDER — PROPOFOL 10 MG/ML IV BOLUS
INTRAVENOUS | Status: DC | PRN
  Administered 2023-09-06: 40 mg via INTRAVENOUS

## 2023-09-06 MED ORDER — PROPOFOL 10 MG/ML IV BOLUS
INTRAVENOUS | Status: AC
  Filled 2023-09-06: qty 40

## 2023-09-06 NOTE — Anesthesia Postprocedure Evaluation (Signed)
 Anesthesia Post Note  Patient: Sandra [name]  Procedure(s) Performed: CARDIOVERSION  Patient location during evaluation: Specials Recovery Anesthesia Type: General Level of consciousness: awake and alert Pain management: pain level controlled Vital Signs Assessment: post-procedure vital signs reviewed and stable Respiratory status: spontaneous breathing, nonlabored ventilation, respiratory function stable and patient connected to nasal cannula oxygen Cardiovascular status: blood pressure returned to baseline and stable Postop Assessment: no apparent nausea or vomiting Anesthetic complications: no  No notable events documented.   Last Vitals:  Vitals:   09/06/23 0815 09/06/23 0830  BP: 137/79 (!) 147/70  Pulse: (!) 55   Resp: 16 14  Temp:    SpO2: 95% 95%    Last Pain:  Vitals:   09/06/23 0830  TempSrc:   PainSc: 0-No pain                 Stephanie Coup

## 2023-09-06 NOTE — Transfer of Care (Signed)
 Immediate Anesthesia Transfer of Care Note  Patient: Sandra [name]  Procedure(s) Performed: CARDIOVERSION  Patient Location: Cath Lab  Anesthesia Type:General  Level of Consciousness: drowsy  Airway & Oxygen Therapy: Patient Spontanous Breathing and Patient connected to nasal cannula oxygen  Post-op Assessment: Report given to RN  Post vital signs: stable  Last Vitals:  Vitals Value Taken Time  BP 154/77 09/06/23 0745  Temp    Pulse 59 09/06/23 0748  Resp 15 09/06/23 0748  SpO2 96 % 09/06/23 0748    Last Pain:  Vitals:   09/06/23 0652  TempSrc: Oral  PainSc: 2          Complications: No notable events documented.

## 2023-09-06 NOTE — CV Procedure (Signed)
 Cardioversion procedure note For atrial fibrillation, persistent.  Procedure Details:  Consent: Risks of procedure as well as the alternatives and risks of each were explained to the (patient/caregiver).  Consent for procedure obtained.  Time Out: Verified patient identification, verified procedure, site/side was marked, verified correct patient position, special equipment/implants available, medications/allergies/relevent history reviewed, required imaging and test results available.  Performed  Patient placed on cardiac monitor, pulse oximetry, supplemental oxygen as necessary.   Sedation given: propofol IV, Dr. Lorette Ang Pacer pads placed anterior and posterior chest.   Cardioverted 2 time(s).   Cardioverted at  120J, 150J. Synchronized biphasic Converted to NSR   Evaluation: Findings: Post procedure EKG shows: NSR Complications: None Patient did tolerate procedure well. Case discussed with EP, Dr. Lalla Brothers  Given prior history of brain bleed on Eliquis 5 mg twice daily, Watchman device, recommendation made to continue Eliquis 2.5 twice daily 1 month post cardioversion.  Time Spent Directly with the Patient:  45 minutes   Dossie Arbour, M.D., Ph.D.

## 2023-09-06 NOTE — Anesthesia Preprocedure Evaluation (Signed)
 Anesthesia Evaluation  Patient identified by MRN, date of birth, ID band Patient awake    Reviewed: Allergy & Precautions, NPO status , Patient's Chart, lab work & pertinent test results  History of Anesthesia Complications Negative for: history of anesthetic complications  Airway Mallampati: III  TM Distance: <3 FB Neck ROM: full    Dental  (+) Chipped   Pulmonary neg pulmonary ROS, neg shortness of breath   Pulmonary exam normal        Cardiovascular Exercise Tolerance: Good hypertension, + CAD and +CHF  (-) Past MI + dysrhythmias Atrial Fibrillation      Neuro/Psych  Headaches CVA, Residual Symptoms  negative psych ROS   GI/Hepatic Neg liver ROS,GERD  Controlled,,  Endo/Other  Hypothyroidism    Renal/GU      Musculoskeletal   Abdominal   Peds  Hematology negative hematology ROS (+)   Anesthesia Other Findings Past Medical History: No date: (HFpEF) heart failure with preserved ejection fraction (HCC)     Comment:  a. 08/2019 Echo: EF 60-65%, no rwma, mild LVH, Gr2 DD, nl              RV fxn. Nl pASP. Mildly dil LA. Mild to mod MR. No date: Anemia No date: Anxiety No date: Arthritis 10/2016: BRCA gene mutation negative     Comment:  NEGATIVE: [employer] 10/16/2016: Breast cancer (HCC)     Comment:  T1c,N0;, GRADE I/III, 1.6cm. ER/PR pos  HER2 not over               expressed, Right Upper Outer No date: Celiac disease No date: Celiac syndrome 07/17/2020: Cerebellar hemorrhage, acute (HCC) No date: Depression No date: Dyspnea     Comment:  WITH EXERTION No date: Fatty liver No date: GERD (gastroesophageal reflux disease)     Comment:  OCC No date: Glaucoma     Comment:  right eye No date: Headache     Comment:  H/O MIGRAINES AS TEENAGER. No date: Heart murmur     Comment:  a. 08/2019 Echo: mild to mod MR. No date: Hypertension No date: Hypothyroidism No date: Non-obstructive CAD (coronary artery  disease)     Comment:  a. 01/2017 MV: Hypertensive response. No               ischemia/infarct. EF >65%; b. 09/2019 Cor CTA: Ca2+ = 9.29              (36th percentile). LAD calcified plaque (0-24%), otw nl.               Multipel bilat pulm nodules up to 7mm. No date: Osteoporosis No date: PAF (paroxysmal atrial fibrillation) (HCC)     Comment:  a. 02/2020 Zio: predominantly RSR, 65 (50-105), rare               PACs/PVCs, 8 beats NSVT, multiple episodes of PAF lasting              up to 1hr . Avg AF rate 130 (93-170). AF burden               <1%. Triggered events = RSR, PACs, and PAF; b.               CHA2DS2VASc = 6. No date: Pneumonia     Comment:  YEARS AGO No date: Pre-diabetes 01/2021: Presence of Watchman left atrial appendage closure device No date: Pulmonary nodules     Comment:  a. 09/2019 Cor CTA: incidental finding of multiple bilat  pulm nodules; b. 12/2019 High Res CT: stable, scattered               solid pulm nodules. No date: Raynaud's disease No date: RLS (restless legs syndrome) 06/2020: Stroke (HCC)     Comment:  mild balance issues No date: Wears hearing aid in both ears  Past Surgical History: No date: ABDOMINAL HYSTERECTOMY No date: APPENDECTOMY 03/05/2017: AXILLARY LYMPH NODE BIOPSY; Right     Comment:  Procedure: AXILLARY LYMPH NODE BIOPSY;  Surgeon:               Earline Mayotte, MD;  Location: [facility] ORS;  Service:               General;  Laterality: Right; No date: CATARACT EXTRACTION; Bilateral No date: CESAREAN SECTION 05/19/2015: COLONOSCOPY WITH PROPOFOL; N/A     Comment:  Procedure: COLONOSCOPY WITH PROPOFOL;  Surgeon: Scot Jun, MD;  Location: [facility] ENDOSCOPY;  Service:               Endoscopy;  Laterality: N/A; 08/01/2019: COLONOSCOPY WITH PROPOFOL; N/A     Comment:  Procedure: COLONOSCOPY WITH PROPOFOL;  Surgeon: Midge Minium, MD;  Location: [facility] ENDOSCOPY;  Service:               Endoscopy;   Laterality: N/A; No date: EYE SURGERY No date: HAND SURGERY; Left 01/27/2021: LEFT ATRIAL APPENDAGE OCCLUSION; N/A     Comment:  Procedure: LEFT ATRIAL APPENDAGE OCCLUSION;  Surgeon:               Lanier Prude, MD;  Location: [facility] INVASIVE CV LAB;                Service: Cardiovascular;  Laterality: N/A; 11/07/2016: MASTECTOMY; Right     Comment:  RESIDUAL INVASIVE MAMMARY CARCINOMA, SUBAREOLAR ANTERIOR              TO PREVIOUS  04/11/2022: OPEN REDUCTION INTERNAL FIXATION (ORIF) DISTAL RADIAL  FRACTURE; Right     Comment:  Procedure: OPEN REDUCTION INTERNAL FIXATION (ORIF)               DISTAL RADIUS FRACTURE;  Surgeon: Kennedy Bucker, MD;                Location: [facility];  Service: Orthopedics;                Laterality: Right; 08/04/2022: OPEN REDUCTION INTERNAL FIXATION (ORIF) DISTAL RADIAL  FRACTURE; Left     Comment:  Procedure: OPEN REDUCTION INTERNAL FIXATION (ORIF)               DISTAL RADIUS FRACTURE;  Surgeon: Kennedy Bucker, MD;                Location: [facility];  Service: Orthopedics;                Laterality: Left; 11/07/2016: SENTINEL NODE BIOPSY; Right     Comment:  Procedure: SENTINEL NODE BIOPSY;  Surgeon: Earline Mayotte, MD;  Location: [facility] ORS;  Service: General;                Laterality: Right; 11/07/2016: SIMPLE MASTECTOMY WITH AXILLARY SENTINEL NODE BIOPSY; Right     Comment:  6  mm ER/PR 100%; Her 2 neu not overexpressed, T1b, N0.                Surgeon: Earline Mayotte, MD;  Location: [facility] ORS;                Service: General;  Laterality: Right; 01/27/2021: TEE WITHOUT CARDIOVERSION; N/A     Comment:  Procedure: TRANSESOPHAGEAL ECHOCARDIOGRAM (TEE);                Surgeon: Lanier Prude, MD;  Location: [facility] INVASIVE               CV LAB;  Service: Cardiovascular;  Laterality: N/A; 03/10/2021: TEE WITHOUT CARDIOVERSION; N/A     Comment:  Procedure: TRANSESOPHAGEAL ECHOCARDIOGRAM (TEE);                Surgeon:  Parke Poisson, MD;  Location: [facility] ENDOSCOPY;              Service: Cardiology;  Laterality: N/A; AGE 78: TONSILLECTOMY 01/12/06: UPPER GI ENDOSCOPY     Comment:  hiatus hernia  BMI    Body Mass Index: 23.86 kg/m      Reproductive/Obstetrics negative OB ROS                             Anesthesia Physical Anesthesia Plan  ASA: 3  Anesthesia Plan: General   Post-op Pain Management: Minimal or no pain anticipated   Induction: Intravenous  PONV Risk Score and Plan: 3 and Propofol infusion, TIVA and Ondansetron  Airway Management Planned: Nasal Cannula  Additional Equipment: None  Intra-op Plan:   Post-operative Plan:   Informed Consent: I have reviewed the patients History and Physical, chart, labs and discussed the procedure including the risks, benefits and alternatives for the proposed anesthesia with the patient or authorized representative who has indicated his/her understanding and acceptance.     Dental advisory given  Plan Discussed with: CRNA and Surgeon  Anesthesia Plan Comments: (Discussed risks of anesthesia with patient, including possibility of difficulty with spontaneous ventilation under anesthesia necessitating airway intervention, PONV, and rare risks such as cardiac or respiratory or neurological events, and allergic reactions. Discussed the role of CRNA in patient's perioperative care. Patient understands.)       Anesthesia Quick Evaluation

## 2023-09-07 ENCOUNTER — Telehealth: Payer: Self-pay | Admitting: Emergency Medicine

## 2023-09-07 ENCOUNTER — Encounter: Payer: Self-pay | Admitting: Cardiovascular Disease

## 2023-09-07 MED ORDER — APIXABAN 2.5 MG PO TABS: 2.5000 mg | ORAL_TABLET | Freq: Two times a day (BID) | ORAL | Status: DC

## 2023-09-07 NOTE — Telephone Encounter (Signed)
 Called and spoke with patient and notified her of the following from Dr. Mariah Milling.  I talked with Lalla Brothers yesterday, we are going to keep her on reduced dose Eliquis 2.5 twice a day for 1 month not the 5 mg. Can we call her to confirm? Thanks   Patient verbalizes understanding.

## 2023-09-08 ENCOUNTER — Telehealth: Payer: Self-pay | Admitting: Student in an Organized Health Care Education/Training Program

## 2023-09-08 ENCOUNTER — Emergency Department: Payer: Medicare (Managed Care)

## 2023-09-08 ENCOUNTER — Other Ambulatory Visit: Payer: Self-pay

## 2023-09-08 ENCOUNTER — Inpatient Hospital Stay
Admission: EM | Admit: 2023-09-08 | Discharge: 2023-09-10 | DRG: 193 | Disposition: A | Discharge status: Home / Self Care | Payer: Medicare (Managed Care) | Attending: Obstetrics and Gynecology | Admitting: Obstetrics and Gynecology

## 2023-09-08 DIAGNOSIS — Z9011 Acquired absence of right breast and nipple: Secondary | ICD-10-CM

## 2023-09-08 DIAGNOSIS — F411 Generalized anxiety disorder: Secondary | ICD-10-CM | POA: Diagnosis present

## 2023-09-08 DIAGNOSIS — I4892 Unspecified atrial flutter: Secondary | ICD-10-CM | POA: Diagnosis present

## 2023-09-08 DIAGNOSIS — H409 Unspecified glaucoma: Secondary | ICD-10-CM | POA: Diagnosis present

## 2023-09-08 DIAGNOSIS — I480 Paroxysmal atrial fibrillation: Secondary | ICD-10-CM | POA: Diagnosis present

## 2023-09-08 DIAGNOSIS — F32A Depression, unspecified: Secondary | ICD-10-CM | POA: Diagnosis present

## 2023-09-08 DIAGNOSIS — Z96642 Presence of left artificial hip joint: Secondary | ICD-10-CM | POA: Diagnosis present

## 2023-09-08 DIAGNOSIS — E876 Hypokalemia: Secondary | ICD-10-CM | POA: Diagnosis present

## 2023-09-08 DIAGNOSIS — R918 Other nonspecific abnormal finding of lung field: Secondary | ICD-10-CM

## 2023-09-08 DIAGNOSIS — I495 Sick sinus syndrome: Secondary | ICD-10-CM | POA: Diagnosis present

## 2023-09-08 DIAGNOSIS — Z79890 Hormone replacement therapy (postmenopausal): Secondary | ICD-10-CM

## 2023-09-08 DIAGNOSIS — Z7982 Long term (current) use of aspirin: Secondary | ICD-10-CM

## 2023-09-08 DIAGNOSIS — I509 Heart failure, unspecified: Secondary | ICD-10-CM

## 2023-09-08 DIAGNOSIS — Z7901 Long term (current) use of anticoagulants: Secondary | ICD-10-CM

## 2023-09-08 DIAGNOSIS — Z79899 Other long term (current) drug therapy: Secondary | ICD-10-CM

## 2023-09-08 DIAGNOSIS — G2581 Restless legs syndrome: Secondary | ICD-10-CM | POA: Diagnosis present

## 2023-09-08 DIAGNOSIS — E039 Hypothyroidism, unspecified: Secondary | ICD-10-CM | POA: Diagnosis present

## 2023-09-08 DIAGNOSIS — R519 Headache, unspecified: Secondary | ICD-10-CM | POA: Diagnosis present

## 2023-09-08 DIAGNOSIS — J189 Pneumonia, unspecified organism: Secondary | ICD-10-CM | POA: Diagnosis not present

## 2023-09-08 DIAGNOSIS — Z9289 Personal history of other medical treatment: Secondary | ICD-10-CM

## 2023-09-08 DIAGNOSIS — I2510 Atherosclerotic heart disease of native coronary artery without angina pectoris: Secondary | ICD-10-CM

## 2023-09-08 DIAGNOSIS — E785 Hyperlipidemia, unspecified: Secondary | ICD-10-CM | POA: Diagnosis present

## 2023-09-08 DIAGNOSIS — Z8249 Family history of ischemic heart disease and other diseases of the circulatory system: Secondary | ICD-10-CM

## 2023-09-08 DIAGNOSIS — Z95818 Presence of other cardiac implants and grafts: Secondary | ICD-10-CM | POA: Diagnosis present

## 2023-09-08 DIAGNOSIS — Z8673 Personal history of transient ischemic attack (TIA), and cerebral infarction without residual deficits: Secondary | ICD-10-CM

## 2023-09-08 DIAGNOSIS — R21 Rash and other nonspecific skin eruption: Secondary | ICD-10-CM | POA: Diagnosis present

## 2023-09-08 DIAGNOSIS — M7989 Other specified soft tissue disorders: Secondary | ICD-10-CM

## 2023-09-08 DIAGNOSIS — I110 Hypertensive heart disease with heart failure: Secondary | ICD-10-CM | POA: Diagnosis present

## 2023-09-08 DIAGNOSIS — I5033 Acute on chronic diastolic (congestive) heart failure: Secondary | ICD-10-CM | POA: Diagnosis present

## 2023-09-08 DIAGNOSIS — Z803 Family history of malignant neoplasm of breast: Secondary | ICD-10-CM

## 2023-09-08 DIAGNOSIS — Z974 Presence of external hearing-aid: Secondary | ICD-10-CM

## 2023-09-08 DIAGNOSIS — Z823 Family history of stroke: Secondary | ICD-10-CM

## 2023-09-08 DIAGNOSIS — K219 Gastro-esophageal reflux disease without esophagitis: Secondary | ICD-10-CM | POA: Diagnosis present

## 2023-09-08 DIAGNOSIS — R5381 Other malaise: Secondary | ICD-10-CM | POA: Diagnosis present

## 2023-09-08 DIAGNOSIS — J9601 Acute respiratory failure with hypoxia: Secondary | ICD-10-CM

## 2023-09-08 DIAGNOSIS — Z807 Family history of other malignant neoplasms of lymphoid, hematopoietic and related tissues: Secondary | ICD-10-CM

## 2023-09-08 HISTORY — DX: Heart failure, unspecified: I50.9

## 2023-09-08 LAB — BASIC METABOLIC PANEL WITH GFR
Anion gap: 6 (ref 5–15)
BUN: 15 mg/dL (ref 8–23)
CO2: 26 mmol/L (ref 22–32)
Calcium: 8.5 mg/dL — ABNORMAL LOW (ref 8.9–10.3)
Chloride: 106 mmol/L (ref 98–111)
Creatinine, Ser: 0.64 mg/dL (ref 0.44–1.00)
GFR, Estimated: >60 mL/min (ref >60)
Glucose, Bld: 100 mg/dL — ABNORMAL HIGH (ref 70–99)
Potassium: 3.7 mmol/L (ref 3.5–5.1)
Sodium: 138 mmol/L (ref 135–145)

## 2023-09-08 LAB — RESP PANEL BY RT-PCR (RSV, FLU A&B, COVID)  RVPGX2
Influenza A by PCR: NEGATIVE
Influenza B by PCR: NEGATIVE
Resp Syncytial Virus by PCR: NEGATIVE
SARS Coronavirus 2 by RT PCR: NEGATIVE

## 2023-09-08 LAB — URINALYSIS, ROUTINE W REFLEX MICROSCOPIC
Bilirubin Urine: NEGATIVE
Glucose, UA: NEGATIVE mg/dL
Hgb urine dipstick: NEGATIVE
Ketones, ur: NEGATIVE mg/dL
Leukocytes,Ua: NEGATIVE
Nitrite: NEGATIVE
Protein, ur: NEGATIVE mg/dL
Specific Gravity, Urine: 1.018 (ref 1.005–1.030)
pH: 7 (ref 5.0–8.0)

## 2023-09-08 LAB — CBC
HCT: 32 % — ABNORMAL LOW (ref 36.0–46.0)
Hemoglobin: 10.2 g/dL — ABNORMAL LOW (ref 12.0–15.0)
MCH: 30.4 pg (ref 26.0–34.0)
MCHC: 31.9 g/dL (ref 30.0–36.0)
MCV: 95.2 fL (ref 80.0–100.0)
Platelets: 218 10*3/uL (ref 150–400)
RBC: 3.36 MIL/uL — ABNORMAL LOW (ref 3.87–5.11)
RDW: 14.2 % (ref 11.5–15.5)
WBC: 6.5 10*3/uL (ref 4.0–10.5)
nRBC: 0 % (ref 0.0–0.2)

## 2023-09-08 LAB — TROPONIN I (HIGH SENSITIVITY)
Troponin I (High Sensitivity): 11 ng/L (ref <18)
Troponin I (High Sensitivity): 10 ng/L (ref <18)

## 2023-09-08 LAB — BRAIN NATRIURETIC PEPTIDE: B Natriuretic Peptide: 225.4 pg/mL — ABNORMAL HIGH (ref 0.0–100.0)

## 2023-09-08 MED ORDER — METOPROLOL TARTRATE 25 MG PO TABS: 12.5000 mg | ORAL_TABLET | ORAL | Status: DC | PRN

## 2023-09-08 MED ORDER — SODIUM CHLORIDE 0.9 % IV SOLN
500.0000 mg | Freq: Once | INTRAVENOUS | Status: AC
  Administered 2023-09-08: 500 mg via INTRAVENOUS
  Filled 2023-09-08: qty 5

## 2023-09-08 MED ORDER — MORPHINE SULFATE (PF) 2 MG/ML IV SOLN: 2.0000 mg | INTRAVENOUS | Status: DC | PRN

## 2023-09-08 MED ORDER — ALPRAZOLAM 0.25 MG PO TABS
0.2500 mg | ORAL_TABLET | Freq: Every day | ORAL | Status: DC
  Administered 2023-09-08 – 2023-09-09 (×2): 0.25 mg via ORAL
  Filled 2023-09-08 (×2): qty 1

## 2023-09-08 MED ORDER — APIXABAN 2.5 MG PO TABS
2.5000 mg | ORAL_TABLET | Freq: Two times a day (BID) | ORAL | Status: DC
  Administered 2023-09-08: 2.5 mg via ORAL
  Filled 2023-09-08 (×2): qty 1

## 2023-09-08 MED ORDER — FUROSEMIDE 10 MG/ML IJ SOLN
40.0000 mg | Freq: Once | INTRAMUSCULAR | Status: AC
  Administered 2023-09-08: 40 mg via INTRAVENOUS
  Filled 2023-09-08: qty 4

## 2023-09-08 MED ORDER — BRIMONIDINE TARTRATE 0.2 % OP SOLN
1.0000 [drp] | Freq: Two times a day (BID) | OPHTHALMIC | Status: DC
  Administered 2023-09-09 – 2023-09-10 (×3): 1 [drp] via OPHTHALMIC
  Filled 2023-09-08 (×2): qty 5

## 2023-09-08 MED ORDER — DOCUSATE SODIUM 100 MG PO CAPS
100.0000 mg | ORAL_CAPSULE | Freq: Every day | ORAL | Status: DC
  Administered 2023-09-09 – 2023-09-10 (×2): 100 mg via ORAL
  Filled 2023-09-08 (×3): qty 1

## 2023-09-08 MED ORDER — ONDANSETRON HCL 4 MG PO TABS
4.0000 mg | ORAL_TABLET | Freq: Four times a day (QID) | ORAL | Status: DC | PRN
Start: 2023-09-08 — End: 2023-09-10

## 2023-09-08 MED ORDER — AMIODARONE HCL 200 MG PO TABS
100.0000 mg | ORAL_TABLET | Freq: Every day | ORAL | Status: DC
  Administered 2023-09-09 – 2023-09-10 (×2): 100 mg via ORAL
  Filled 2023-09-08 (×2): qty 1

## 2023-09-08 MED ORDER — LEVOTHYROXINE SODIUM 100 MCG PO TABS
100.0000 ug | ORAL_TABLET | Freq: Every day | ORAL | Status: DC
  Administered 2023-09-09 – 2023-09-10 (×2): 100 ug via ORAL
  Filled 2023-09-08 (×2): qty 1

## 2023-09-08 MED ORDER — HYDRALAZINE HCL 50 MG PO TABS: 50.0000 mg | ORAL_TABLET | Freq: Three times a day (TID) | ORAL | Status: DC | PRN

## 2023-09-08 MED ORDER — FUROSEMIDE 10 MG/ML IJ SOLN
40.0000 mg | Freq: Two times a day (BID) | INTRAMUSCULAR | Status: DC
Start: 2023-09-09 — End: 2023-09-10
  Administered 2023-09-09 – 2023-09-10 (×3): 40 mg via INTRAVENOUS
  Filled 2023-09-08 (×3): qty 4

## 2023-09-08 MED ORDER — ACETAMINOPHEN 500 MG PO TABS
1000.0000 mg | ORAL_TABLET | Freq: Once | ORAL | Status: AC
  Administered 2023-09-08: 1000 mg via ORAL
  Filled 2023-09-08: qty 2

## 2023-09-08 MED ORDER — HYDROCODONE-ACETAMINOPHEN 5-325 MG PO TABS
1.0000 | ORAL_TABLET | ORAL | Status: DC | PRN
  Administered 2023-09-09: 1 via ORAL
  Filled 2023-09-08: qty 1

## 2023-09-08 MED ORDER — SERTRALINE HCL 50 MG PO TABS
100.0000 mg | ORAL_TABLET | Freq: Every day | ORAL | Status: DC
  Administered 2023-09-09 – 2023-09-10 (×3): 100 mg via ORAL
  Filled 2023-09-08 (×3): qty 2

## 2023-09-08 MED ORDER — SODIUM CHLORIDE 0.9 % IV SOLN
2.0000 g | INTRAVENOUS | Status: DC
  Administered 2023-09-09: 2 g via INTRAVENOUS
  Filled 2023-09-08 (×2): qty 20

## 2023-09-08 MED ORDER — ACETAMINOPHEN 650 MG RE SUPP: 650.0000 mg | Freq: Four times a day (QID) | RECTAL | Status: DC | PRN

## 2023-09-08 MED ORDER — SODIUM CHLORIDE 0.9 % IV SOLN
500.0000 mg | INTRAVENOUS | Status: DC
  Administered 2023-09-09: 500 mg via INTRAVENOUS
  Filled 2023-09-08 (×2): qty 5

## 2023-09-08 MED ORDER — ONDANSETRON HCL 4 MG/2ML IJ SOLN
4.0000 mg | Freq: Four times a day (QID) | INTRAMUSCULAR | Status: DC | PRN
Start: 2023-09-08 — End: 2023-09-10

## 2023-09-08 MED ORDER — ACETAMINOPHEN 325 MG PO TABS: 650.0000 mg | ORAL_TABLET | Freq: Four times a day (QID) | ORAL | Status: DC | PRN

## 2023-09-08 MED ORDER — ALBUTEROL SULFATE (2.5 MG/3ML) 0.083% IN NEBU: 2.5000 mg | INHALATION_SOLUTION | RESPIRATORY_TRACT | Status: DC | PRN

## 2023-09-08 MED ORDER — LOSARTAN POTASSIUM 50 MG PO TABS
75.0000 mg | ORAL_TABLET | Freq: Every day | ORAL | Status: DC
  Administered 2023-09-09 – 2023-09-10 (×2): 75 mg via ORAL
  Filled 2023-09-08: qty 1
  Filled 2023-09-08: qty 2

## 2023-09-08 MED ORDER — ATORVASTATIN CALCIUM 20 MG PO TABS
40.0000 mg | ORAL_TABLET | Freq: Every day | ORAL | Status: DC
Start: 2023-09-09 — End: 2023-09-10
  Administered 2023-09-09 – 2023-09-10 (×2): 40 mg via ORAL
  Filled 2023-09-08 (×2): qty 2

## 2023-09-08 MED ORDER — IOHEXOL 350 MG/ML SOLN
75.0000 mL | Freq: Once | INTRAVENOUS | Status: AC | PRN
  Administered 2023-09-08: 75 mL via INTRAVENOUS

## 2023-09-08 MED ORDER — GUAIFENESIN ER 600 MG PO TB12
600.0000 mg | ORAL_TABLET | Freq: Two times a day (BID) | ORAL | Status: DC
Start: 2023-09-08 — End: 2023-09-10
  Administered 2023-09-08 – 2023-09-10 (×4): 600 mg via ORAL
  Filled 2023-09-08 (×4): qty 1

## 2023-09-08 MED ORDER — ASPIRIN 81 MG PO TBEC: 81.0000 mg | DELAYED_RELEASE_TABLET | Freq: Every day | ORAL | Status: DC

## 2023-09-08 MED ORDER — PANTOPRAZOLE SODIUM 40 MG PO TBEC
80.0000 mg | DELAYED_RELEASE_TABLET | Freq: Every day | ORAL | Status: DC
  Administered 2023-09-09 – 2023-09-10 (×3): 80 mg via ORAL
  Filled 2023-09-08 (×4): qty 2

## 2023-09-08 MED ORDER — SODIUM CHLORIDE 0.9 % IV SOLN
2.0000 g | Freq: Once | INTRAVENOUS | Status: AC
  Administered 2023-09-08: 2 g via INTRAVENOUS
  Filled 2023-09-08: qty 20

## 2023-09-08 NOTE — Assessment & Plan Note (Signed)
 Bilateral pleural effusions IV Lasix Continue metoprolol, losartan, hydralazine Daily weights with intake and output monitoring

## 2023-09-08 NOTE — H&P (Signed)
 History and Physical    Patient: Sandra [name]:[id number] DOB: 09/28/1945 DOA: 09/08/2023 DOS: the patient was seen and examined on 09/08/2023 PCP: Benuel Brazier, MD  Patient coming from: Home  Chief Complaint:  Chief Complaint  Patient presents with   Shortness of Breath    HPI: Sandra [name] is a 78 y.o. female with medical history significant for HFpEF, A-fib s/p watchman's and recent cardioversion on 09/06/2023, HTN, nonobstructive CAD, prior CVA, hospitalized from 2/21 to 07/30/2023 with a hip fracture from an accidental fall, stay complicated by orthostatic hypotension, who is being admitted with multifocal pneumonia and mild CHF with new oxygen requirement.  Patient had been doing well since her cardioversion on 09/06/2023 and has been ambulatory without problems since her hip surgery but started developing shortness of breath and cough that awoke her from sleep the night prior and then dyspnea on exertion during the day today.  She denies chest pain or fever or chills.  She has mild swelling on the left leg without pain. ED course and data review: BP 161/75 with mild tachypnea to 28 and otherwise normal vitals. Labs notable for troponin 10 and BNP 225, normal WBC and negative respiratory viral panel HemoGlobin 11.2 which is about her baseline UA unremarkable EKG, personally viewed and interpreted showing NSR at 72 with LVH and no concerning ischemic findings CTA chest PE protocol negative for PE but showing findings worrisome for multifocal pneumonia as well as small bilateral pleural effusions right greater than left and nodules left lower lobe Lower extremity venous ultrasound negative for DVT Patient treated with Rocephin and azithromycin and given a dose of Lasix.  Following treatment in the ED, patient maintained normal O2 sat at rest but dropped to 88% with ambulation and was subsequently started on O2 at 2 L   Review of Systems: As mentioned in the history of  present illness. All other systems reviewed and are negative.  Past Medical History:  Diagnosis Date   (HFpEF) heart failure with preserved ejection fraction (HCC)    a. 08/2019 Echo: EF 60-65%, no rwma, mild LVH, Gr2 DD, nl RV fxn. Nl pASP. Mildly dil LA. Mild to mod MR.   Anemia    Anxiety    Arthritis    BRCA gene mutation negative 10/2016   NEGATIVE: [employer]   Breast cancer (HCC) 10/16/2016   T1c,N0;, GRADE I/III, 1.6cm. ER/PR pos  HER2 not over expressed, Right Upper Outer   Celiac disease    Celiac syndrome    Cerebellar hemorrhage, acute (HCC) 07/17/2020   CHF (congestive heart failure) (HCC)    Depression    Dyspnea    WITH EXERTION   Fatty liver    GERD (gastroesophageal reflux disease)    OCC   Glaucoma    right eye   Headache    H/O MIGRAINES AS TEENAGER.   Heart murmur    a. 08/2019 Echo: mild to mod MR.   Hypertension    Hypothyroidism    Non-obstructive CAD (coronary artery disease)    a. 01/2017 MV: Hypertensive response. No ischemia/infarct. EF >65%; b. 09/2019 Cor CTA: Ca2+ = 9.29 (36th percentile). LAD calcified plaque (0-24%), otw nl. Multipel bilat pulm nodules up to 7mm.   Osteoporosis    PAF (paroxysmal atrial fibrillation) (HCC)    a. 02/2020 Zio: predominantly RSR, 65 (50-105), rare PACs/PVCs, 8 beats NSVT, multiple episodes of PAF lasting up to 1hr . Avg AF rate 130 (93-170). AF burden <1%. Triggered events = RSR,  PACs, and PAF; b. CHA2DS2VASc = 6.   Pneumonia    YEARS AGO   Pre-diabetes    Presence of Watchman left atrial appendage closure device 01/2021   Pulmonary nodules    a. 09/2019 Cor CTA: incidental finding of multiple bilat pulm nodules; b. 12/2019 High Res CT: stable, scattered solid pulm nodules.   Raynaud's disease    RLS (restless legs syndrome)    Stroke (HCC) 06/2020   mild balance issues   Wears hearing aid in both ears    Past Surgical History:  Procedure Laterality Date   ABDOMINAL HYSTERECTOMY     APPENDECTOMY      AXILLARY LYMPH NODE BIOPSY Right 03/05/2017   Procedure: AXILLARY LYMPH NODE BIOPSY;  Surgeon: Marshall Skeeter, MD;  Location: [facility] ORS;  Service: General;  Laterality: Right;   CARDIOVERSION N/A 09/06/2023   Procedure: CARDIOVERSION;  Surgeon: Devorah Fonder, MD;  Location: [facility];  Service: Cardiovascular;  Laterality: N/A;   CATARACT EXTRACTION Bilateral    CESAREAN SECTION     COLONOSCOPY WITH PROPOFOL N/A 05/19/2015   Procedure: COLONOSCOPY WITH PROPOFOL;  Surgeon: Cassie Click, MD;  Location: [facility] ENDOSCOPY;  Service: Endoscopy;  Laterality: N/A;   COLONOSCOPY WITH PROPOFOL N/A 08/01/2019   Procedure: COLONOSCOPY WITH PROPOFOL;  Surgeon: Marnee Sink, MD;  Location: [facility] ENDOSCOPY;  Service: Endoscopy;  Laterality: N/A;   EYE SURGERY     HAND SURGERY Left    HIP ARTHROPLASTY Left 07/21/2023   Procedure: ARTHROPLASTY BIPOLAR HIP (HEMIARTHROPLASTY);  Surgeon: Marlynn Singer, MD;  Location: [facility] OR[facility];  Service: Orthopedics;  Laterality: Left;   LEFT ATRIAL APPENDAGE OCCLUSION N/A 01/27/2021   Procedure: LEFT ATRIAL APPENDAGE OCCLUSION;  Surgeon: Boyce Byes, MD;  Location: [facility] INVASIVE CV LAB;  Service: Cardiovascular;  Laterality: N/A;   MASTECTOMY Right 11/07/2016   RESIDUAL INVASIVE MAMMARY CARCINOMA, SUBAREOLAR ANTERIOR TO PREVIOUS    OPEN REDUCTION INTERNAL FIXATION (ORIF) DISTAL RADIAL FRACTURE Right 04/11/2022   Procedure: OPEN REDUCTION INTERNAL FIXATION (ORIF) DISTAL RADIUS FRACTURE;  Surgeon: Molli Angelucci, MD;  Location: [facility];  Service: Orthopedics;  Laterality: Right;   OPEN REDUCTION INTERNAL FIXATION (ORIF) DISTAL RADIAL FRACTURE Left 08/04/2022   Procedure: OPEN REDUCTION INTERNAL FIXATION (ORIF) DISTAL RADIUS FRACTURE;  Surgeon: Molli Angelucci, MD;  Location: [facility];  Service: Orthopedics;  Laterality: Left;   SENTINEL NODE BIOPSY Right 11/07/2016   Procedure: SENTINEL NODE BIOPSY;  Surgeon: Marshall Skeeter, MD;  Location: [facility];   Service: General;  Laterality: Right;   SIMPLE MASTECTOMY WITH AXILLARY SENTINEL NODE BIOPSY Right 11/07/2016   6 mm ER/PR 100%; Her 2 neu not overexpressed, T1b, N0.  Surgeon: Marshall Skeeter, MD;  Location: [facility];  Service: General;  Laterality: Right;   TEE WITHOUT CARDIOVERSION N/A 01/27/2021   Procedure: TRANSESOPHAGEAL ECHOCARDIOGRAM (TEE);  Surgeon: Boyce Byes, MD;  Location: [facility] INVASIVE CV LAB;  Service: Cardiovascular;  Laterality: N/A;   TEE WITHOUT CARDIOVERSION N/A 03/10/2021   Procedure: TRANSESOPHAGEAL ECHOCARDIOGRAM (TEE);  Surgeon: Euell Herrlich, MD;  Location: [facility] ENDOSCOPY;  Service: Cardiology;  Laterality: N/A;   TONSILLECTOMY  AGE 9   UPPER GI ENDOSCOPY  01/12/06   hiatus hernia   Social History:  reports that she has never smoked. She has never used smokeless tobacco. She reports that she does not currently use alcohol. She reports that she does not use drugs.  No Known Allergies  Family History  Problem Relation Age of Onset   Cancer Mother  Anemia Mother    Other Mother        lymphosarcoma   Alcohol abuse Father    Prostate cancer Father    Breast cancer Sister        77; 1/2 sister, shared mother.   Cancer Sister        bile duct   Stroke Maternal Grandmother    Breast cancer Maternal Grandmother 68   Heart attack Maternal Grandfather    CAD Paternal Grandfather     Prior to Admission medications   Medication Sig Start Date End Date Taking? Authorizing Provider  acetaminophen (TYLENOL) 325 MG tablet Take 2 tablets (650 mg total) by mouth every 6 (six) hours as needed for mild pain (pain score 1-3), fever or headache (or temp > 100.5). Patient taking differently: Take 500-750 mg by mouth every 6 (six) hours as needed for mild pain (pain score 1-3), fever, headache or moderate pain (pain score 4-6) (or temp > 100.5). 07/30/23 10/28/23  Althia Atlas, MD  ALPRAZolam (XANAX) 0.5 MG tablet Take 1 tablet (0.5 mg total) by mouth at bedtime as  needed. for sleep Patient taking differently: Take 0.25 mg by mouth at bedtime. for sleep 03/24/22   Lamon Pillow, MD  amiodarone (PACERONE) 200 MG tablet Take 0.5 tablets (100 mg total) by mouth daily. 07/11/23   Boyce Byes, MD  apixaban (ELIQUIS) 2.5 MG TABS tablet Take 1 tablet (2.5 mg total) by mouth 2 (two) times daily. 09/07/23   Boyce Byes, MD  aspirin EC 81 MG tablet Take 1 tablet (81 mg total) by mouth daily. Swallow whole. Restart after finishing aspirin 81 mg p.o. twice daily for total 6 weeks after surgery. 09/03/23   Althia Atlas, MD  atorvastatin (LIPITOR) 40 MG tablet Take 1 tablet (40 mg total) by mouth daily. 07/11/23   End, Veryl Gottron, MD  bisacodyl 5 MG EC tablet Take 5 mg by mouth daily as needed for moderate constipation.    [provider]  brimonidine (ALPHAGAN) 0.2 % ophthalmic solution Place 1 drop into both eyes 2 (two) times daily. 07/12/23   [provider]  Calcium Carb-Cholecalciferol (CALCIUM 600 + D PO) Take 1 tablet by mouth daily.    [provider]  Cholecalciferol (VITAMIN D3) 125 MCG (5000 UT) TABS Take 5,000 Units by mouth daily.    [provider]  cyanocobalamin (VITAMIN B12) 500 MCG tablet Take 1 tablet (500 mcg total) by mouth daily. 07/31/23 10/29/23  Althia Atlas, MD  docusate sodium (COLACE) 100 MG capsule Take 100 mg by mouth daily.    [provider]  ferrous sulfate 325 (65 FE) MG tablet Take 325 mg by mouth daily with breakfast.    [provider]  hydrALAZINE (APRESOLINE) 50 MG tablet Take 1 tablet (50 mg total) by mouth 3 (three) times daily as needed (SBP >160). 07/30/23   Althia Atlas, MD  latanoprost (XALATAN) 0.005 % ophthalmic solution Place 1 drop into both eyes at bedtime.    [provider]  levothyroxine (SYNTHROID) 100 MCG tablet TAKE 1 TABLET BY MOUTH 30  MINUTES BEFORE BREAKFAST 02/02/22   Nikki Barters, MD  losartan (COZAAR) 50 MG tablet Take 1.5 tablets (75 mg  total) by mouth daily. 07/11/23 10/09/23  End, Veryl Gottron, MD  metoprolol tartrate (LOPRESSOR) 25 MG tablet Take 0.5 tablets (12.5 mg total) by mouth as needed (Afib episodes with HR > 120bpm.). 06/01/23 09/06/23  Riddle, Suzann, NP  Multiple Vitamin (MULTIVITAMIN WITH MINERALS) TABS tablet  Take 1 tablet by mouth daily. Centrum Silver    [provider]  omeprazole (PRILOSEC) 40 MG capsule TAKE 1 CAPSULE BY MOUTH DAILY 02/02/22   Nikki Barters, MD  potassium chloride (KLOR-CON) 20 MEQ packet DISSOLVE 2 PACKETS IN 4 OZ WATER OR JUICE AND DRINK DAILY 07/11/23   End, Veryl Gottron, MD  sertraline (ZOLOFT) 100 MG tablet Take 100 mg by mouth daily.    [provider]  simethicone (MYLICON) 80 MG chewable tablet Chew 80 mg by mouth every 6 (six) hours as needed for flatulence.    [provider]    Physical Exam: Vitals:   09/08/23 1426 09/08/23 1700 09/08/23 1900 09/08/23 2017  BP:  (!) 176/78 (!) 163/66   Pulse:  74 73   Resp:  (!) 28 (!) 24   Temp: 98 F (36.7 C)   98.4 F (36.9 C)  TempSrc: Oral   Oral  SpO2:  96% 97%   Weight:      Height:       Physical Exam Vitals and nursing note reviewed.  Constitutional:      General: She is not in acute distress. HENT:     Head: Normocephalic and atraumatic.  Cardiovascular:     Rate and Rhythm: Normal rate and regular rhythm.     Heart sounds: Normal heart sounds.  Pulmonary:     Effort: No tachypnea.     Breath sounds: Normal breath sounds.  Abdominal:     Palpations: Abdomen is soft.     Tenderness: There is no abdominal tenderness.  Neurological:     Mental Status: Mental status is at baseline.     Labs on Admission: I have personally reviewed following labs and imaging studies  CBC: Recent Labs  Lab 09/08/23 1426  WBC 6.5  HGB 10.2*  HCT 32.0*  MCV 95.2  PLT 218   Basic Metabolic Panel: Recent Labs  Lab 09/08/23 1426  NA 138  K 3.7  CL 106  CO2 26  GLUCOSE 100*  BUN 15  CREATININE 0.64   CALCIUM 8.5*   GFR: Estimated Creatinine Clearance: 47.9 mL/min (by C-G formula based on SCr of 0.64 mg/dL). Liver Function Tests: No results for input(s): "AST", "ALT", "ALKPHOS", "BILITOT", "PROT", "ALBUMIN" in the last 168 hours. No results for input(s): "LIPASE", "AMYLASE" in the last 168 hours. No results for input(s): "AMMONIA" in the last 168 hours. Coagulation Profile: No results for input(s): "INR", "PROTIME" in the last 168 hours. Cardiac Enzymes: No results for input(s): "CKTOTAL", "CKMB", "CKMBINDEX", "TROPONINI" in the last 168 hours. BNP (last 3 results) No results for input(s): "PROBNP" in the last 8760 hours. HbA1C: No results for input(s): "HGBA1C" in the last 72 hours. CBG: No results for input(s): "GLUCAP" in the last 168 hours. Lipid Profile: No results for input(s): "CHOL", "HDL", "LDLCALC", "TRIG", "CHOLHDL", "LDLDIRECT" in the last 72 hours. Thyroid Function Tests: No results for input(s): "TSH", "T4TOTAL", "FREET4", "T3FREE", "THYROIDAB" in the last 72 hours. Anemia Panel: No results for input(s): "VITAMINB12", "FOLATE", "FERRITIN", "TIBC", "IRON", "RETICCTPCT" in the last 72 hours. Urine analysis:    Component Value Date/Time   COLORURINE STRAW (A) 09/08/2023 1531   APPEARANCEUR CLEAR (A) 09/08/2023 1531   LABSPEC 1.018 09/08/2023 1531   PHURINE 7.0 09/08/2023 1531   GLUCOSEU NEGATIVE 09/08/2023 1531   HGBUR NEGATIVE 09/08/2023 1531   BILIRUBINUR NEGATIVE 09/08/2023 1531   BILIRUBINUR Negative 11/24/2019 1352   KETONESUR NEGATIVE 09/08/2023 1531   PROTEINUR NEGATIVE 09/08/2023 1531  UROBILINOGEN 0.2 11/24/2019 1352   NITRITE NEGATIVE 09/08/2023 1531   LEUKOCYTESUR NEGATIVE 09/08/2023 1531    Radiological Exams on Admission: US  Venous Img Lower Unilateral Left Result Date: 09/08/2023 CLINICAL DATA:  Swelling EXAM: LEFT LOWER EXTREMITY VENOUS DOPPLER ULTRASOUND TECHNIQUE: Gray-scale sonography with compression, as well as color and duplex  ultrasound, were performed to evaluate the deep venous system(s) from the level of the common femoral vein through the popliteal and proximal calf veins. COMPARISON:  Contralateral 08/18/2019 FINDINGS: VENOUS Normal compressibility of the common femoral, superficial femoral, and popliteal veins, as well as the visualized calf veins. Visualized portions of profunda femoral vein and great saphenous vein unremarkable. No filling defects to suggest DVT on grayscale or color Doppler imaging. Doppler waveforms show normal direction of venous flow, normal respiratory plasticity and response to augmentation. Limited views of the contralateral common femoral vein are unremarkable. OTHER None. Limitations: none IMPRESSION: Negative. Electronically Signed   By: Nicoletta Barrier M.D.   On: 09/08/2023 17:05   CT Angio Chest PE W and/or Wo Contrast Result Date: 09/08/2023 CLINICAL DATA:  Shortness of breath and fatigue. Leg swelling. Evaluate for PE. EXAM: CT ANGIOGRAPHY CHEST WITH CONTRAST TECHNIQUE: Multidetector CT imaging of the chest was performed using the standard protocol during bolus administration of intravenous contrast. Multiplanar CT image reconstructions and MIPs were obtained to evaluate the vascular anatomy. RADIATION DOSE REDUCTION: This exam was performed according to the departmental dose-optimization program which includes automated exposure control, adjustment of the mA and/or kV according to patient size and/or use of iterative reconstruction technique. CONTRAST:  75mL OMNIPAQUE IOHEXOL 350 MG/ML SOLN COMPARISON:  Chest CT 04/21/2022 FINDINGS: Cardiovascular: There is adequate opacification of the pulmonary arteries. No pulmonary embolism identified. The heart is mildly enlarged. Aorta is nondilated. There are atherosclerotic calcifications of the aorta. Left atrial device again noted. There is no pericardial effusion. Mediastinum/Nodes: No enlarged mediastinal, hilar, or axillary lymph nodes. Thyroid gland,  trachea, and esophagus demonstrate no significant findings. Lungs/Pleura: There are small bilateral pleural effusions, right greater than left. There are patchy and confluent air areas of ground-glass throughout both lungs. There is an air trapping in the lung bases similar to prior. There also minimal patchy airspace opacities in the bilateral upper lobes. There is a 5 mm left lower lobe pulmonary nodule image 6/106 (previously measuring 4 mm). There is a 5 mm nodule in the left lower lobe image 6/100 (previously measuring 3 mm). There are new ill-defined slightly nodular densities scattered throughout the right upper lobe measuring up to 8 mm. Upper Abdomen: No acute abnormality. Musculoskeletal: No chest wall abnormality. No acute or significant osseous findings. Review of the MIP images confirms the above findings. IMPRESSION: 1. No evidence for pulmonary embolism. 2. Small bilateral pleural effusions, right greater than left. 3. Patchy and confluent areas of ground-glass throughout both lungs, new from prior, worrisome for multifocal pneumonia. 4. New ill-defined slightly nodular densities scattered throughout the right upper lobe measuring up to 8 mm, likely infectious/inflammatory. 5. Stable air trapping in the lung bases which can be seen with small airways disease. 6. Nodular densities in the left lower lobe measuring up to 5 mm have mildly increased in size compared to 2023 and are indeterminate. Continued follow-up recommended. 7. Mild cardiomegaly. 8. Aortic atherosclerosis. Electronically Signed   By: Tyron Gallon M.D.   On: 09/08/2023 16:12   DG Chest 2 View Result Date: 09/08/2023 CLINICAL DATA:  Shortness of breath EXAM: CHEST - 2 VIEW COMPARISON:  X-ray  07/22/2023 and older FINDINGS: Loop recorder. Enlarged cardiopericardial silhouette with calcified aorta. Atrial appendage occlusion device. Vascular congestion. No pneumothorax, effusion or edema. No consolidation. Degenerative changes of the  spine. IMPRESSION: Stable mild enlargement of the cardiac silhouette with vascular congestion. Loop recorder. Atrial appendage occlusion device. Electronically Signed   By: Adrianna Horde M.D.   On: 09/08/2023 15:13     Data Reviewed: Relevant notes from primary care and specialist visits, past discharge summaries as available in EHR, including Care Everywhere. Prior diagnostic testing as pertinent to current admission diagnoses Updated medications and problem lists for reconciliation ED course, including vitals, labs, imaging, treatment and response to treatment Triage notes, nursing and pharmacy notes and ED provider's notes Notable results as noted in HPI   Assessment and Plan: * Multifocal pneumonia Acute respiratory failure with hypoxia Rocephin and azithromycin Antitussives, albuterol as needed Supplemental oxygen to wean as tolerated  Acute on chronic heart failure with preserved ejection fraction (HFpEF) (HCC) Bilateral pleural effusions IV Lasix Continue metoprolol, losartan, hydralazine Daily weights with intake and output monitoring  Left leg swelling Venous ultrasound negative for DVT and CTA chest negative for PE Likely related to hip surgery a month prior  Atrial fibrillation S/p cardioversion 09/06/23 , prior watchman's device placement Currently in sinus rhythm Continue amiodarone and metoprolol. Was previously off DOAC due to history of ICH.  Eliquis restarted on 4/1 following cardioversion  Non-obstructive CAD (coronary artery disease) No complaints of chest pain, troponin normal and EKG nonacute Continue atorvastatin, losartan, metoprolol, aspirin and apixaban  History of stroke Continue apixaban and atorvastatin  Depression Continue sertraline  Pulmonary nodules Continue outpatient surveillance  Tachycardia-bradycardia syndrome (HCC) Continuous cardiac monitoring     DVT prophylaxis: Apixaban  Consults: none  Advance Care Planning:   Code  Status: Prior   Family Communication: none  Disposition Plan: Back to previous home environment  Severity of Illness: The appropriate patient status for this patient is OBSERVATION. Observation status is judged to be reasonable and necessary in order to provide the required intensity of service to ensure the patient's safety. The patient's presenting symptoms, physical exam findings, and initial radiographic and laboratory data in the context of their medical condition is felt to place them at decreased risk for further clinical deterioration. Furthermore, it is anticipated that the patient will be medically stable for discharge from the hospital within 2 midnights of admission.   Author: Lanetta Pion, MD 09/08/2023 10:05 PM  For on call review [email or web address].

## 2023-09-08 NOTE — Telephone Encounter (Signed)
 Sandra [name] paged the on call cardiologist overnight to discuss her new onset shortness of breath since last night. She had a DCCV several days ago and is calling to see if her symptoms are related to the procedure. I informed her that she should be seen in the ED as her symptoms are not typical.    [name], MD MS  Overnight Cardiology

## 2023-09-08 NOTE — Hospital Course (Signed)
 [name]

## 2023-09-08 NOTE — ED Notes (Signed)
 Pt changed into gown. Pt resting comfortably in bed at this time. Pt is alert and oriented with even and regular respirations. No acute distress noted. Pt denies any needs at this time. Call light within reach. Family remains at bedside.

## 2023-09-08 NOTE — Assessment & Plan Note (Signed)
Continue apixaban and atorvastatin 

## 2023-09-08 NOTE — Assessment & Plan Note (Signed)
 Continue sertraline

## 2023-09-08 NOTE — ED Triage Notes (Signed)
 Pt to ED POV ambulatory with walker for SOB since last night.   Had outpatient cardioversion on 4/10 with Dr Gollan.  Pt had L hip surgery 6 weeks ago, also having some hip discomfort.  States wants to make sure not retaining fluid. States L foot swollen (is slightly swollen but barely).  Started back on Eliquis X1 month after cardioversion   Speaking in full sentences, respirations unlabored. States just cannot get a deep breath.

## 2023-09-08 NOTE — ED Notes (Signed)
 CCMD called to place pt on monitor

## 2023-09-08 NOTE — ED Notes (Signed)
 Pt placed on purwick and 2 liters of O2 via nasal cannula.

## 2023-09-08 NOTE — Assessment & Plan Note (Signed)
Continue outpatient surveillance. °

## 2023-09-08 NOTE — ED Provider Notes (Signed)
 [facility] Provider Note    Event Date/Time   First MD Initiated Contact with Patient 09/08/23 1501     (approximate)   History   Shortness of Breath   HPI Sandra [name] is a 78 y.o. female with history of HFpEF, paroxysmal A-fib, HTN, HLD, hypothyroidism presenting today for shortness of breath.  Patient states she had cardioversion with cardiology 2 days ago.  Also had a recent left hip surgery 6 weeks ago.  Last night she developed shortness of breath and cough that woke her up from sleep.  Notices it when she is walking around.  Denies any chest pain, nausea, vomiting, abdominal pain, sweating.  Has also had some left lower extremity swelling which is new within the past week.  She has previously only been on aspirin until she was placed on Eliquis 1 week ago prior to the cardioversion.  Denies any history of blood clots.     Physical Exam   Triage Vital Signs: ED Triage Vitals  Encounter Vitals Group     BP 09/08/23 1422 (!) 161/75     Systolic BP Percentile --      Diastolic BP Percentile --      Pulse Rate 09/08/23 1422 72     Resp 09/08/23 1422 18     Temp 09/08/23 1426 98 F (36.7 C)     Temp Source 09/08/23 1422 Oral     SpO2 09/08/23 1422 95 %     Weight 09/08/23 1425 127 lb (57.6 kg)     Height 09/08/23 1425 5\' 3"  (1.6 m)     Head Circumference --      Peak Flow --      Pain Score 09/08/23 1423 3     Pain Loc --      Pain Education --      Exclude from Growth Chart --     Most recent vital signs: Vitals:   09/08/23 1700 09/08/23 1900  BP: (!) 176/78 (!) 163/66  Pulse: 74 73  Resp: (!) 28 (!) 24  Temp:    SpO2: 96% 97%   Physical Exam: I have reviewed the vital signs and nursing notes. General: Awake, alert, no acute distress.  Nontoxic appearing. Head:  Atraumatic, normocephalic.   ENT:  EOM intact, PERRL. Oral mucosa is pink and moist with no lesions. Neck: Neck is supple with full range of motion, No meningeal  signs. Cardiovascular:  RRR, No murmurs. Peripheral pulses palpable and equal bilaterally. Respiratory:  Symmetrical chest wall expansion.  Scattered rales throughout.  Good air movement throughout.  No use of accessory muscles.   Musculoskeletal:  No cyanosis.  2+ pitting edema to the left lower extremity.  Moving extremities with full ROM Abdomen:  Soft, nontender, nondistended. Neuro:  GCS 15, moving all four extremities, interacting appropriately. Speech clear. Psych:  Calm, appropriate.   Skin:  Warm, dry, no rash.    ED Results / Procedures / Treatments   Labs (all labs ordered are listed, but only abnormal results are displayed) Labs Reviewed  BASIC METABOLIC PANEL WITH GFR - Abnormal; Notable for the following components:      Result Value   Glucose, Bld 100 (*)    Calcium 8.5 (*)    All other components within normal limits  CBC - Abnormal; Notable for the following components:   RBC 3.36 (*)    Hemoglobin 10.2 (*)    HCT 32.0 (*)    All other components within normal  limits  BRAIN NATRIURETIC PEPTIDE - Abnormal; Notable for the following components:   B Natriuretic Peptide 225.4 (*)    All other components within normal limits  URINALYSIS, ROUTINE W REFLEX MICROSCOPIC - Abnormal; Notable for the following components:   Color, Urine STRAW (*)    APPearance CLEAR (*)    All other components within normal limits  RESP PANEL BY RT-PCR (RSV, FLU A&B, COVID)  RVPGX2  TROPONIN I (HIGH SENSITIVITY)  TROPONIN I (HIGH SENSITIVITY)     EKG My EKG interpretation: Rate of 72, normal sinus rhythm, normal axis, normal intervals.  No acute ST elevations or depressions   RADIOLOGY Independently interpreted chest x-ray with evidence of multifocal pneumonia as well as some vascular congestion.  CTA chest with no evidence of PE and ultrasound of left lower extremity with no DVT.   PROCEDURES:  Critical Care performed: Yes, see critical care procedure note(s)  .Critical  Care  Performed by: Kandee Orion, MD Authorized by: Kandee Orion, MD   Critical care provider statement:    Critical care time (minutes):  30   Critical care was necessary to treat or prevent imminent or life-threatening deterioration of the following conditions:  Respiratory failure   Critical care was time spent personally by me on the following activities:  Development of treatment plan with patient or surrogate, discussions with consultants, evaluation of patient's response to treatment, examination of patient, ordering and review of laboratory studies, ordering and review of radiographic studies, ordering and performing treatments and interventions, pulse oximetry, re-evaluation of patient's condition and review of old charts   I assumed direction of critical care for this patient from another provider in my specialty: no     Care discussed with: admitting provider      MEDICATIONS ORDERED IN ED: Medications  azithromycin (ZITHROMAX) 500 mg in sodium chloride 0.9 % 250 mL IVPB (has no administration in time range)  iohexol (OMNIPAQUE) 350 MG/ML injection 75 mL (75 mLs Intravenous Contrast Given 09/08/23 1540)  acetaminophen (TYLENOL) tablet 1,000 mg (1,000 mg Oral Given 09/08/23 1723)  cefTRIAXone (ROCEPHIN) 2 g in sodium chloride 0.9 % 100 mL IVPB (0 g Intravenous Stopped 09/08/23 1902)  furosemide (LASIX) injection 40 mg (40 mg Intravenous Given 09/08/23 1816)     IMPRESSION / MDM / ASSESSMENT AND PLAN / ED COURSE  I reviewed the triage vital signs and the nursing notes.                              Differential diagnosis includes, but is not limited to, PE, DVT, CHF exacerbation, pneumonia, COVID, flu, RSV, lower suspicion for ACS  Patient's presentation is most consistent with acute presentation with potential threat to life or bodily function.  Patient is a 78 year old female presenting today for 1 day of shortness of breath and cough.  No particular chest pain except for  when she is coughing.  She does have edema to her left lower extremity which is the site of her surgery on that left hip.  Not previously on Eliquis until the past couple of days prior to her cardioversion.  Will get ultrasound imaging of the left lower extremity as well as CTA chest to rule out blood clots.  EKG reassuring.  CBC and BMP largely unremarkable.  Chest x-ray shows mild vascular congestion and will get BNP to evaluate for potential heart failure as well as a source of her swelling.  Family also  requested respiratory swab as well as UA.  CTA chest shows no evidence of PE but does show evidence of multifocal pneumonia as well as some vascular congestion.  Ultrasound of left lower extremity with no evidence of DVT.  Patient was given ceftriaxone azithromycin as well as a dose of Lasix.  She did get hypoxic with ambulation down to 88% and had to be started on 2 L nasal cannula.  Will admit to hospitalist for acute hypoxia secondary to pneumonia and CHF exacerbation.  The patient is on the cardiac monitor to evaluate for evidence of arrhythmia and/or significant heart rate changes. Clinical Course as of 09/08/23 1942  Sat Sep 08, 2023  1941 Patient desaturated to 88% with ambulation.  Placed on 2 L. [DW]    Clinical Course User Index [DW] Kandee Orion, MD     FINAL CLINICAL IMPRESSION(S) / ED DIAGNOSES   Final diagnoses:  Multifocal pneumonia  Acute on chronic congestive heart failure, unspecified heart failure type (HCC)  Acute hypoxic respiratory failure (HCC)     Rx / DC Orders   ED Discharge Orders     None        Note:  This document was prepared using Dragon voice recognition software and may include unintentional dictation errors.   Kandee Orion, MD 09/08/23 [phone]

## 2023-09-08 NOTE — H&P (Signed)

## 2023-09-08 NOTE — ED Notes (Signed)
 Blue top sent to lab.

## 2023-09-08 NOTE — Assessment & Plan Note (Signed)
 Acute respiratory failure with hypoxia Rocephin and azithromycin Antitussives, albuterol as needed Supplemental oxygen to wean as tolerated

## 2023-09-08 NOTE — ED Notes (Signed)
 The pt was able to ambulate with her walker to the end of the hall without incident. The pt was 94 percent on room air. Once the pt was in the room and in the bed she began to c/o increased sob and wheezing. Dr. Karlynn Oyster was made aware.

## 2023-09-08 NOTE — Assessment & Plan Note (Signed)
 No complaints of chest pain, troponin normal and EKG nonacute Continue atorvastatin, losartan, metoprolol, aspirin and apixaban

## 2023-09-08 NOTE — Interval H&P Note (Signed)
 History and Physical Interval Note:  09/08/2023 1:30 PM  Sandra [name]  has presented today for surgery, with the diagnosis of Cardioversion   Afib.  The various methods of treatment have been discussed with the patient and family. After consideration of risks, benefits and other options for treatment, the patient has consented to  Procedure(s): CARDIOVERSION (N/A) as a surgical intervention.  The patient's history has been reviewed, patient examined, no change in status, stable for surgery.  I have reviewed the patient's chart and labs.  Questions were answered to the patient's satisfaction.     Dequon Schnebly

## 2023-09-08 NOTE — Assessment & Plan Note (Addendum)
 Currently in sinus rhythm Continue amiodarone and metoprolol. Was previously off DOAC due to history of ICH.  Eliquis restarted on 4/1 following cardioversion

## 2023-09-08 NOTE — Assessment & Plan Note (Signed)
Had episodes in the past though with an ICD; followed closely with cardiology last admission. -Continuous cardiac monitoring  

## 2023-09-08 NOTE — Assessment & Plan Note (Addendum)
 Venous ultrasound negative for DVT and CTA chest negative for PE Likely related to hip surgery a month prior

## 2023-09-09 ENCOUNTER — Observation Stay (HOSPITAL_COMMUNITY)
Admit: 2023-09-09 | Discharge: 2023-09-09 | Disposition: A | Discharge status: Home / Self Care | Payer: Medicare (Managed Care) | Attending: Internal Medicine | Admitting: Internal Medicine

## 2023-09-09 ENCOUNTER — Observation Stay: Payer: Medicare (Managed Care)

## 2023-09-09 DIAGNOSIS — J9601 Acute respiratory failure with hypoxia: Secondary | ICD-10-CM | POA: Diagnosis present

## 2023-09-09 DIAGNOSIS — Z9011 Acquired absence of right breast and nipple: Secondary | ICD-10-CM | POA: Diagnosis not present

## 2023-09-09 DIAGNOSIS — R21 Rash and other nonspecific skin eruption: Secondary | ICD-10-CM | POA: Diagnosis present

## 2023-09-09 DIAGNOSIS — Z8249 Family history of ischemic heart disease and other diseases of the circulatory system: Secondary | ICD-10-CM | POA: Diagnosis not present

## 2023-09-09 DIAGNOSIS — I480 Paroxysmal atrial fibrillation: Secondary | ICD-10-CM | POA: Diagnosis present

## 2023-09-09 DIAGNOSIS — I5031 Acute diastolic (congestive) heart failure: Secondary | ICD-10-CM

## 2023-09-09 DIAGNOSIS — Z8673 Personal history of transient ischemic attack (TIA), and cerebral infarction without residual deficits: Secondary | ICD-10-CM | POA: Diagnosis not present

## 2023-09-09 DIAGNOSIS — I495 Sick sinus syndrome: Secondary | ICD-10-CM | POA: Diagnosis present

## 2023-09-09 DIAGNOSIS — M7989 Other specified soft tissue disorders: Secondary | ICD-10-CM | POA: Diagnosis present

## 2023-09-09 DIAGNOSIS — R5381 Other malaise: Secondary | ICD-10-CM | POA: Diagnosis present

## 2023-09-09 DIAGNOSIS — I5033 Acute on chronic diastolic (congestive) heart failure: Secondary | ICD-10-CM | POA: Diagnosis present

## 2023-09-09 DIAGNOSIS — F411 Generalized anxiety disorder: Secondary | ICD-10-CM | POA: Diagnosis present

## 2023-09-09 DIAGNOSIS — Z79890 Hormone replacement therapy (postmenopausal): Secondary | ICD-10-CM | POA: Diagnosis not present

## 2023-09-09 DIAGNOSIS — I2510 Atherosclerotic heart disease of native coronary artery without angina pectoris: Secondary | ICD-10-CM | POA: Diagnosis present

## 2023-09-09 DIAGNOSIS — E876 Hypokalemia: Secondary | ICD-10-CM | POA: Diagnosis present

## 2023-09-09 DIAGNOSIS — I110 Hypertensive heart disease with heart failure: Secondary | ICD-10-CM | POA: Diagnosis present

## 2023-09-09 DIAGNOSIS — R519 Headache, unspecified: Secondary | ICD-10-CM | POA: Diagnosis present

## 2023-09-09 DIAGNOSIS — J189 Pneumonia, unspecified organism: Secondary | ICD-10-CM | POA: Diagnosis not present

## 2023-09-09 DIAGNOSIS — I4892 Unspecified atrial flutter: Secondary | ICD-10-CM | POA: Diagnosis present

## 2023-09-09 DIAGNOSIS — E785 Hyperlipidemia, unspecified: Secondary | ICD-10-CM | POA: Diagnosis present

## 2023-09-09 DIAGNOSIS — Z95818 Presence of other cardiac implants and grafts: Secondary | ICD-10-CM | POA: Diagnosis not present

## 2023-09-09 DIAGNOSIS — Z7901 Long term (current) use of anticoagulants: Secondary | ICD-10-CM | POA: Diagnosis not present

## 2023-09-09 DIAGNOSIS — G2581 Restless legs syndrome: Secondary | ICD-10-CM | POA: Diagnosis present

## 2023-09-09 DIAGNOSIS — E039 Hypothyroidism, unspecified: Secondary | ICD-10-CM | POA: Diagnosis present

## 2023-09-09 DIAGNOSIS — F32A Depression, unspecified: Secondary | ICD-10-CM | POA: Diagnosis present

## 2023-09-09 LAB — CBC
HCT: 30 % — ABNORMAL LOW (ref 36.0–46.0)
Hemoglobin: 9.6 g/dL — ABNORMAL LOW (ref 12.0–15.0)
MCH: 30.7 pg (ref 26.0–34.0)
MCHC: 32 g/dL (ref 30.0–36.0)
MCV: 95.8 fL (ref 80.0–100.0)
Platelets: 191 10*3/uL (ref 150–400)
RBC: 3.13 MIL/uL — ABNORMAL LOW (ref 3.87–5.11)
RDW: 14.4 % (ref 11.5–15.5)
WBC: 5.6 10*3/uL (ref 4.0–10.5)
nRBC: 0 % (ref 0.0–0.2)

## 2023-09-09 LAB — RESPIRATORY PANEL BY PCR

## 2023-09-09 LAB — MRSA NEXT GEN BY PCR, NASAL: MRSA by PCR Next Gen: NOT DETECTED

## 2023-09-09 LAB — POTASSIUM: Potassium: 3.7 mmol/L (ref 3.5–5.1)

## 2023-09-09 LAB — BASIC METABOLIC PANEL WITH GFR
Anion gap: 9 (ref 5–15)
BUN: 14 mg/dL (ref 8–23)
CO2: 27 mmol/L (ref 22–32)
Calcium: 8.2 mg/dL — ABNORMAL LOW (ref 8.9–10.3)
Chloride: 102 mmol/L (ref 98–111)
Creatinine, Ser: 0.56 mg/dL (ref 0.44–1.00)
GFR, Estimated: >60 mL/min (ref >60)
Glucose, Bld: 112 mg/dL — ABNORMAL HIGH (ref 70–99)
Potassium: 2.9 mmol/L — ABNORMAL LOW (ref 3.5–5.1)
Sodium: 138 mmol/L (ref 135–145)

## 2023-09-09 LAB — PROCALCITONIN: Procalcitonin: <0.1 ng/mL

## 2023-09-09 LAB — STREP PNEUMONIAE URINARY ANTIGEN: Strep Pneumo Urinary Antigen: NEGATIVE

## 2023-09-09 LAB — MAGNESIUM: Magnesium: 1.9 mg/dL (ref 1.7–2.4)

## 2023-09-09 MED ORDER — POTASSIUM CHLORIDE 10 MEQ/100ML IV SOLN
10.0000 meq | INTRAVENOUS | Status: DC
  Administered 2023-09-09 (×2): 10 meq via INTRAVENOUS
  Filled 2023-09-09 (×3): qty 100

## 2023-09-09 MED ORDER — LATANOPROST 0.005 % OP SOLN
1.0000 [drp] | Freq: Every day | OPHTHALMIC | Status: DC
  Filled 2023-09-09: qty 2.5

## 2023-09-09 MED ORDER — POTASSIUM CHLORIDE CRYS ER 20 MEQ PO TBCR
60.0000 meq | EXTENDED_RELEASE_TABLET | Freq: Once | ORAL | Status: AC
  Administered 2023-09-09: 60 meq via ORAL
  Filled 2023-09-09: qty 3

## 2023-09-09 NOTE — Progress Notes (Signed)
  Echocardiogram 2D Echocardiogram has been performed.  Nichola Barges 09/09/2023, 2:58 PM

## 2023-09-09 NOTE — Care Management Obs Status (Signed)
 MEDICARE OBSERVATION STATUS NOTIFICATION   Patient Details  Name: Sandra [name] MRN: [id number] Date of Birth: 04-Oct-1945   Medicare Observation Status Notification Given:  Rudolph Cost, CMA 09/09/2023, 11:07 AM

## 2023-09-09 NOTE — Progress Notes (Signed)
 PHARMACY CONSULT NOTE - FOLLOW UP  Pharmacy Consult for Electrolyte Monitoring and Replacement   Recent Labs: Potassium (mmol/L)  Date Value  09/09/2023 2.9 (L)   Magnesium (mg/dL)  Date Value  40/98/1191 2.1   Calcium (mg/dL)  Date Value  47/82/9562 8.2 (L)   Albumin (g/dL)  Date Value  13/12/6576 3.9   Phosphorus (mg/dL)  Date Value  46/96/2952 3.6   Sodium (mmol/L)  Date Value  09/09/2023 138  08/30/2023 141     Assessment: 4/13:  K @ 0508 = 2.9  Goal of Therapy:  Electrolytes WNL   Plan:  KCl 10 mEq IV X 4 ordered to start on 4/13 @ 0700. - will recheck electrolytes on 4/13 @ 1200.   Alvenia Job ,PharmD Clinical Pharmacist 09/09/2023 6:22 AM

## 2023-09-09 NOTE — ED Notes (Signed)
 Elisabeth Guild and Dr. Vallarie Gauze notified of pts recent potassium of 2.9

## 2023-09-09 NOTE — ED Notes (Signed)
Pt resting comfortably in bed at this time. Pt is alert and oriented with even and regular respirations. No acute distress noted. Pt denies any needs at this time. Call light within reach.  °

## 2023-09-09 NOTE — Progress Notes (Signed)
 PROGRESS NOTE    Sandra [name]  WGN:[id number] DOB: November 29, 1945 DOA: 09/08/2023 PCP: Benuel Brazier, MD  Outpatient Specialists: cardiology    Brief Narrative:   From admission h and p  Sandra [name] is a 78 y.o. female with medical history significant for HFpEF, A-fib s/p watchman's and recent cardioversion on 09/06/2023, HTN, nonobstructive CAD, prior CVA, hospitalized from 2/21 to 07/30/2023 with a hip fracture from an accidental fall, stay complicated by orthostatic hypotension, who is being admitted with multifocal pneumonia and mild CHF with new oxygen requirement.  Patient had been doing well since her cardioversion on 08/28/2023 and has been ambulatory without problems since her hip surgery but started developing shortness of breath and cough that awoke her from sleep the night prior and then dyspnea on exertion during the day today.  She denies chest pain or fever or chills.  She has mild swelling on the left leg without pain.   Assessment & Plan:   Principal Problem:   Multifocal pneumonia Active Problems:   Acute respiratory failure with hypoxia (HCC)   Acute on chronic heart failure with preserved ejection fraction (HFpEF) (HCC)   Atrial fibrillation S/p cardioversion 08/28/23 , prior watchman's device placement   Left leg swelling   Non-obstructive CAD (coronary artery disease)   History of stroke   Depression   Pulmonary nodules   Presence of Watchman left atrial appendage closure device   Tachycardia-bradycardia syndrome (HCC)   Hx of right mastectomy  # CAP  Two days cough and shortness of breath, dry cough. Infiltrate on cxr - cont ceftriaxone, azithromycin - f/u rvp, urine antigens, mrsa swab, procal  # CHF exacerbation Ddx of above includes chf exacerbation and indeed many of her symptoms (orthopnea, abruptness of onset, lack of constitutional symptoms) suggests chf. Did have recent cardioversion. - continue lasix 40 IV bid - TTE pending  # Acute  hypoxic respiratory failure 2/2 above process. I discontinued O2 this morning and sats currently low to mid 90s - will monitor off oxygen  # A-fib S/p watchman device and DCCV cardioversion 4/10, in sinus currently - home amio - home apixaban, will hold pending ct head  # Headache New, does have hx ICH and recent start of apixaban - CT head  # Pulmonary nodules May be infectious, will need outpt imaging  # Hypokalemia - replete, f/u Mg  # Rash In b/l LEs, none elsewhere - monitor for now  # GAD - home alprazolam, zoloft  # CAD No chest pain, normal trops - home statin  # Hypothyroid - home levo  # HTN Mild elevation - home losartan  # Debility # Recent hip fracture - PT consult - outpt ortho f/u   DVT prophylaxis: SCDs today Code Status: full Family Communication: daughter Moira Andrews updated telephonically 4/13  Level of care: Progressive Status is: Observation    Consultants:  none  Procedures: none  Antimicrobials:  See above    Subjective: Reports some improvement in cough, sob  Objective: Vitals:   09/09/23 0500 09/09/23 0509 09/09/23 0700 09/09/23 0800  BP:   (!) 141/73 (!) 150/76  Pulse:   67 67  Resp:   (!) 23 (!) 21  Temp: 98.1 F (36.7 C) 98.2 F (36.8 C)    TempSrc: Oral Oral    SpO2:   98% 98%  Weight:      Height:        Intake/Output Summary (Last 24 hours) at 09/09/2023 0816 Last data filed at 09/09/2023 [phone]  Gross per 24 hour  Intake 250 ml  Output 775 ml  Net -525 ml   Filed Weights   09/08/23 1425  Weight: 57.6 kg    Examination:  General exam: Appears calm and comfortable  Respiratory system: Clear to auscultation save for rales at bases Cardiovascular system: S1 & S2 heard, RRR.   Gastrointestinal system: Abdomen is nondistended, soft and nontender.   Central nervous system: Alert and oriented. No focal neurological deficits. Extremities: Symmetric 5 x 5 power. Trace LE edema Skin: non-blanching  erythematous fine petechial rash LEs Psychiatry: Judgement and insight appear normal. Mood & affect appropriate.     Data Reviewed: I have personally reviewed following labs and imaging studies  CBC: Recent Labs  Lab 09/08/23 1426 09/09/23 0508  WBC 6.5 5.6  HGB 10.2* 9.6*  HCT 32.0* 30.0*  MCV 95.2 95.8  PLT 218 191   Basic Metabolic Panel: Recent Labs  Lab 09/08/23 1426 09/09/23 0508  NA 138 138  K 3.7 2.9*  CL 106 102  CO2 26 27  GLUCOSE 100* 112*  BUN 15 14  CREATININE 0.64 0.56  CALCIUM 8.5* 8.2*  MG  --  1.9   GFR: Estimated Creatinine Clearance: 47.9 mL/min (by C-G formula based on SCr of 0.56 mg/dL). Liver Function Tests: No results for input(s): "AST", "ALT", "ALKPHOS", "BILITOT", "PROT", "ALBUMIN" in the last 168 hours. No results for input(s): "LIPASE", "AMYLASE" in the last 168 hours. No results for input(s): "AMMONIA" in the last 168 hours. Coagulation Profile: No results for input(s): "INR", "PROTIME" in the last 168 hours. Cardiac Enzymes: No results for input(s): "CKTOTAL", "CKMB", "CKMBINDEX", "TROPONINI" in the last 168 hours. BNP (last 3 results) No results for input(s): "PROBNP" in the last 8760 hours. HbA1C: No results for input(s): "HGBA1C" in the last 72 hours. CBG: No results for input(s): "GLUCAP" in the last 168 hours. Lipid Profile: No results for input(s): "CHOL", "HDL", "LDLCALC", "TRIG", "CHOLHDL", "LDLDIRECT" in the last 72 hours. Thyroid Function Tests: No results for input(s): "TSH", "T4TOTAL", "FREET4", "T3FREE", "THYROIDAB" in the last 72 hours. Anemia Panel: No results for input(s): "VITAMINB12", "FOLATE", "FERRITIN", "TIBC", "IRON", "RETICCTPCT" in the last 72 hours. Urine analysis:    Component Value Date/Time   COLORURINE STRAW (A) 09/08/2023 1531   APPEARANCEUR CLEAR (A) 09/08/2023 1531   LABSPEC 1.018 09/08/2023 1531   PHURINE 7.0 09/08/2023 1531   GLUCOSEU NEGATIVE 09/08/2023 1531   HGBUR NEGATIVE 09/08/2023 1531    BILIRUBINUR NEGATIVE 09/08/2023 1531   BILIRUBINUR Negative 11/24/2019 1352   KETONESUR NEGATIVE 09/08/2023 1531   PROTEINUR NEGATIVE 09/08/2023 1531   UROBILINOGEN 0.2 11/24/2019 1352   NITRITE NEGATIVE 09/08/2023 1531   LEUKOCYTESUR NEGATIVE 09/08/2023 1531   Sepsis Labs: @LABRCNTIP (procalcitonin:4,lacticidven:4)  ) Recent Results (from the past 240 hours)  Resp panel by RT-PCR (RSV, Flu A&B, Covid) Anterior Nasal Swab     Status: None   Collection Time: 09/08/23  3:45 PM   Specimen: Anterior Nasal Swab  Result Value Ref Range Status   SARS Coronavirus 2 by RT PCR NEGATIVE NEGATIVE Final    Comment: (NOTE) SARS-CoV-2 target nucleic acids are NOT DETECTED.  The SARS-CoV-2 RNA is generally detectable in upper respiratory specimens during the acute phase of infection. The lowest concentration of SARS-CoV-2 viral copies this assay can detect is 138 copies/mL. A negative result does not preclude SARS-Cov-2 infection and should not be used as the sole basis for treatment or other patient management decisions. A negative result may occur with  improper  specimen collection/handling, submission of specimen other than nasopharyngeal swab, presence of viral mutation(s) within the areas targeted by this assay, and inadequate number of viral copies(<138 copies/mL). A negative result must be combined with clinical observations, patient history, and epidemiological information. The expected result is Negative.  Fact Sheet for Patients:  BloggerCourse.com  Fact Sheet for Healthcare Providers:  SeriousBroker.it  This test is no t yet approved or cleared by the [place]  FDA and  has been authorized for detection and/or diagnosis of SARS-CoV-2 by FDA under an Emergency Use Authorization (EUA). This EUA will remain  in effect (meaning this test can be used) for the duration of the COVID-19 declaration under Section 564(b)(1) of the  Act, 21 U.S.C.section 360bbb-3(b)(1), unless the authorization is terminated  or revoked sooner.       Influenza A by PCR NEGATIVE NEGATIVE Final   Influenza B by PCR NEGATIVE NEGATIVE Final    Comment: (NOTE) The Xpert Xpress SARS-CoV-2/FLU/RSV plus assay is intended as an aid in the diagnosis of influenza from Nasopharyngeal swab specimens and should not be used as a sole basis for treatment. Nasal washings and aspirates are unacceptable for Xpert Xpress SARS-CoV-2/FLU/RSV testing.  Fact Sheet for Patients: BloggerCourse.com  Fact Sheet for Healthcare Providers: SeriousBroker.it  This test is not yet approved or cleared by the [place]  FDA and has been authorized for detection and/or diagnosis of SARS-CoV-2 by FDA under an Emergency Use Authorization (EUA). This EUA will remain in effect (meaning this test can be used) for the duration of the COVID-19 declaration under Section 564(b)(1) of the Act, 21 U.S.C. section 360bbb-3(b)(1), unless the authorization is terminated or revoked.     Resp Syncytial Virus by PCR NEGATIVE NEGATIVE Final    Comment: (NOTE) Fact Sheet for Patients: BloggerCourse.com  Fact Sheet for Healthcare Providers: SeriousBroker.it  This test is not yet approved or cleared by the [place]  FDA and has been authorized for detection and/or diagnosis of SARS-CoV-2 by FDA under an Emergency Use Authorization (EUA). This EUA will remain in effect (meaning this test can be used) for the duration of the COVID-19 declaration under Section 564(b)(1) of the Act, 21 U.S.C. section 360bbb-3(b)(1), unless the authorization is terminated or revoked.  Performed at [facility], [address], [place], [place] [address]          Radiology Studies: US  Venous Img Lower Unilateral Left Result Date: 09/08/2023 CLINICAL DATA:  Swelling EXAM:  LEFT LOWER EXTREMITY VENOUS DOPPLER ULTRASOUND TECHNIQUE: Gray-scale sonography with compression, as well as color and duplex ultrasound, were performed to evaluate the deep venous system(s) from the level of the common femoral vein through the popliteal and proximal calf veins. COMPARISON:  Contralateral 08/18/2019 FINDINGS: VENOUS Normal compressibility of the common femoral, superficial femoral, and popliteal veins, as well as the visualized calf veins. Visualized portions of profunda femoral vein and great saphenous vein unremarkable. No filling defects to suggest DVT on grayscale or color Doppler imaging. Doppler waveforms show normal direction of venous flow, normal respiratory plasticity and response to augmentation. Limited views of the contralateral common femoral vein are unremarkable. OTHER None. Limitations: none IMPRESSION: Negative. Electronically Signed   By: Nicoletta Barrier M.D.   On: 09/08/2023 17:05   CT Angio Chest PE W and/or Wo Contrast Result Date: 09/08/2023 CLINICAL DATA:  Shortness of breath and fatigue. Leg swelling. Evaluate for PE. EXAM: CT ANGIOGRAPHY CHEST WITH CONTRAST TECHNIQUE: Multidetector CT imaging of the chest was performed using the standard protocol during bolus  administration of intravenous contrast. Multiplanar CT image reconstructions and MIPs were obtained to evaluate the vascular anatomy. RADIATION DOSE REDUCTION: This exam was performed according to the departmental dose-optimization program which includes automated exposure control, adjustment of the mA and/or kV according to patient size and/or use of iterative reconstruction technique. CONTRAST:  75mL OMNIPAQUE IOHEXOL 350 MG/ML SOLN COMPARISON:  Chest CT 04/21/2022 FINDINGS: Cardiovascular: There is adequate opacification of the pulmonary arteries. No pulmonary embolism identified. The heart is mildly enlarged. Aorta is nondilated. There are atherosclerotic calcifications of the aorta. Left atrial device again noted.  There is no pericardial effusion. Mediastinum/Nodes: No enlarged mediastinal, hilar, or axillary lymph nodes. Thyroid gland, trachea, and esophagus demonstrate no significant findings. Lungs/Pleura: There are small bilateral pleural effusions, right greater than left. There are patchy and confluent air areas of ground-glass throughout both lungs. There is an air trapping in the lung bases similar to prior. There also minimal patchy airspace opacities in the bilateral upper lobes. There is a 5 mm left lower lobe pulmonary nodule image 6/106 (previously measuring 4 mm). There is a 5 mm nodule in the left lower lobe image 6/100 (previously measuring 3 mm). There are new ill-defined slightly nodular densities scattered throughout the right upper lobe measuring up to 8 mm. Upper Abdomen: No acute abnormality. Musculoskeletal: No chest wall abnormality. No acute or significant osseous findings. Review of the MIP images confirms the above findings. IMPRESSION: 1. No evidence for pulmonary embolism. 2. Small bilateral pleural effusions, right greater than left. 3. Patchy and confluent areas of ground-glass throughout both lungs, new from prior, worrisome for multifocal pneumonia. 4. New ill-defined slightly nodular densities scattered throughout the right upper lobe measuring up to 8 mm, likely infectious/inflammatory. 5. Stable air trapping in the lung bases which can be seen with small airways disease. 6. Nodular densities in the left lower lobe measuring up to 5 mm have mildly increased in size compared to 2023 and are indeterminate. Continued follow-up recommended. 7. Mild cardiomegaly. 8. Aortic atherosclerosis. Electronically Signed   By: Tyron Gallon M.D.   On: 09/08/2023 16:12   DG Chest 2 View Result Date: 09/08/2023 CLINICAL DATA:  Shortness of breath EXAM: CHEST - 2 VIEW COMPARISON:  X-ray 07/22/2023 and older FINDINGS: Loop recorder. Enlarged cardiopericardial silhouette with calcified aorta. Atrial  appendage occlusion device. Vascular congestion. No pneumothorax, effusion or edema. No consolidation. Degenerative changes of the spine. IMPRESSION: Stable mild enlargement of the cardiac silhouette with vascular congestion. Loop recorder. Atrial appendage occlusion device. Electronically Signed   By: Adrianna Horde M.D.   On: 09/08/2023 15:13        Scheduled Meds:  ALPRAZolam  0.25 mg Oral QHS   amiodarone  100 mg Oral Daily   apixaban  2.5 mg Oral BID   atorvastatin  40 mg Oral Daily   brimonidine  1 drop Both Eyes BID   docusate sodium  100 mg Oral Daily   furosemide  40 mg Intravenous BID   guaiFENesin  600 mg Oral BID   levothyroxine  100 mcg Oral Q0600   losartan  75 mg Oral Daily   pantoprazole  80 mg Oral Daily   sertraline  100 mg Oral Daily   Continuous Infusions:  azithromycin     cefTRIAXone (ROCEPHIN)  IV     potassium chloride 10 mEq (09/09/23 0738)     LOS: 0 days     Raymonde Calico, MD [facility]   If 7PM-7AM, please contact night-coverage [email or web address] Password  [email or web address] 09/09/2023, 8:16 AM

## 2023-09-09 NOTE — Evaluation (Signed)
 Physical Therapy Evaluation Patient Details Name: Sandra [name] MRN: [id number] DOB: 1945-11-03 Today's Date: 09/09/2023  History of Present Illness  Pt is a 78 y/o F admitted on 09/08/23 after presenting with c/o SOB &  cough, as well as DOE. Pt is being treated for CAP, CHF exacerbation. PMH: HFpEF, a-fib s/p watchman's & recent cardioversion 09/06/23, HTN, nonobstructive CAD, prior CVA, hospitalized 2/21-07/30/23 2/2 fall & hip fx  Clinical Impression  Pt seen for PT evaluation with pt agreeable to tx. Pt reports prior to admission she had completed HHPT & was about to start OPPT. On this date, pt is able to complete bed mobility with mod I with HOB elevated, bed rails. Pt transfers STS with cuing re: hand placement to push to standing, is able to ambulate in hallway with RW & CGA<>close supervision. Pt would benefit from ongoing PT services to address RLE strengthening, balance, gait & stair negotiation with LRAD.  Pt did note dizziness at end of session but VSS.      If plan is discharge home, recommend the following: A little help with walking and/or transfers;Assistance with cooking/housework;A little help with bathing/dressing/bathroom;Assist for transportation;Help with stairs or ramp for entrance   Can travel by private vehicle        Equipment Recommendations None recommended by PT  Recommendations for Other Services       Functional Status Assessment Patient has had a recent decline in their functional status and demonstrates the ability to make significant improvements in function in a reasonable and predictable amount of time.     Precautions / Restrictions Precautions Precautions: Fall Restrictions Weight Bearing Restrictions Per Provider Order: No      Mobility  Bed Mobility Overal bed mobility: Needs Assistance Bed Mobility: Supine to Sit     Supine to sit: HOB elevated, Used rails, Modified independent (Device/Increase time)          Transfers Overall  transfer level: Needs assistance Equipment used: Rolling walker (2 wheels) Transfers: Sit to/from Stand Sit to Stand: Supervision           General transfer comment: education re: hand placement    Ambulation/Gait Ambulation/Gait assistance: Contact guard assist, Supervision Gait Distance (Feet): 150 Feet Assistive device: Rolling walker (2 wheels) Gait Pattern/deviations: Decreased step length - right, Decreased stride length, Decreased step length - left Gait velocity: decreased        Stairs            Wheelchair Mobility     Tilt Bed    Modified Rankin (Stroke Patients Only)       Balance Overall balance assessment: Needs assistance Sitting-balance support: Feet supported Sitting balance-Leahy Scale: Good     Standing balance support: During functional activity, Bilateral upper extremity supported Standing balance-Leahy Scale: Fair                               Pertinent Vitals/Pain Pain Assessment Pain Assessment: No/denies pain    Home Living Family/patient expects to be discharged to:: Private residence Living Arrangements: Spouse/significant other Available Help at Discharge: Family;Friend(s);Available PRN/intermittently Type of Home: House Home Access: Stairs to enter Entrance Stairs-Rails: Right Entrance Stairs-Number of Steps: 3 + 2   Home Layout: Two level;Able to live on main level with bedroom/bathroom Home Equipment: Rollator (4 wheels);BSC/3in1;Rolling Walker (2 wheels)      Prior Function  Mobility Comments: Ambulatory with RW, completed HHPT & about to start OPPT. No other falls besides one leading to hip sx.       Extremity/Trunk Assessment   Upper Extremity Assessment Upper Extremity Assessment: Overall WFL for tasks assessed    Lower Extremity Assessment Lower Extremity Assessment: Generalized weakness (pt with rash on BLE (small red spots on distal BLE), reports it's getting worse)        Communication   Communication Communication: Impaired Factors Affecting Communication: Hearing impaired    Cognition Arousal: Alert Behavior During Therapy: WFL for tasks assessed/performed   PT - Cognitive impairments: No apparent impairments                                 Cueing       General Comments General comments (skin integrity, edema, etc.): BP WNL despite pt c/o dizziness, SpO2 WNL on room air when good pleth waveform (not always good during gait, pt endorses mild SOB)    Exercises     Assessment/Plan    PT Assessment Patient needs continued PT services  PT Problem List Decreased strength;Cardiopulmonary status limiting activity;Decreased activity tolerance;Decreased balance;Decreased mobility;Decreased safety awareness;Decreased knowledge of use of DME       PT Treatment Interventions DME instruction;Balance training;Neuromuscular re-education;Stair training;Gait training;Functional mobility training;Therapeutic activities;Manual techniques;Therapeutic exercise;Patient/family education    PT Goals (Current goals can be found in the Care Plan section)  Acute Rehab PT Goals Patient Stated Goal: get better, return home, resume OPPT PT Goal Formulation: With patient Time For Goal Achievement: 09/23/23 Potential to Achieve Goals: Good    Frequency Min 2X/week     Co-evaluation               AM-PAC PT "6 Clicks" Mobility  Outcome Measure Help needed turning from your back to your side while in a flat bed without using bedrails?: None Help needed moving from lying on your back to sitting on the side of a flat bed without using bedrails?: A Little Help needed moving to and from a bed to a chair (including a wheelchair)?: A Little Help needed standing up from a chair using your arms (e.g., wheelchair or bedside chair)?: A Little Help needed to walk in hospital room?: A Little Help needed climbing 3-5 steps with a railing? : A Little 6  Click Score: 19    End of Session   Activity Tolerance: Patient tolerated treatment well Patient left: in chair;with nursing/sitter in room;with call bell/phone within reach Nurse Communication: Mobility status PT Visit Diagnosis: Muscle weakness (generalized) (M62.81);Other abnormalities of gait and mobility (R26.89);Unsteadiness on feet (R26.81)    Time: 1610-9604 PT Time Calculation (min) (ACUTE ONLY): 15 min   Charges:   PT Evaluation $PT Eval Low Complexity: 1 Low   PT General Charges $$ ACUTE PT VISIT: 1 Visit         Emaline Handsome, PT, DPT 09/09/23, 1:35 PM   Venetta Gill 09/09/2023, 1:34 PM

## 2023-09-10 ENCOUNTER — Other Ambulatory Visit: Payer: Self-pay

## 2023-09-10 DIAGNOSIS — J189 Pneumonia, unspecified organism: Secondary | ICD-10-CM | POA: Diagnosis not present

## 2023-09-10 LAB — ECHOCARDIOGRAM COMPLETE
AR max vel: 2.02 cm2
AV Peak grad: 13.7 mmHg
Ao pk vel: 1.85 m/s
Area-P 1/2: 2.96 cm2
Height: 63 in
S' Lateral: 2.9 cm
Weight: 2032 [oz_av]

## 2023-09-10 LAB — BASIC METABOLIC PANEL WITH GFR
Anion gap: 10 (ref 5–15)
BUN: 18 mg/dL (ref 8–23)
CO2: 25 mmol/L (ref 22–32)
Calcium: 8.4 mg/dL — ABNORMAL LOW (ref 8.9–10.3)
Chloride: 102 mmol/L (ref 98–111)
Creatinine, Ser: 0.72 mg/dL (ref 0.44–1.00)
GFR, Estimated: >60 mL/min (ref >60)
Glucose, Bld: 96 mg/dL (ref 70–99)
Potassium: 3.5 mmol/L (ref 3.5–5.1)
Sodium: 137 mmol/L (ref 135–145)

## 2023-09-10 LAB — CBC
HCT: 31.6 % — ABNORMAL LOW (ref 36.0–46.0)
Hemoglobin: 10.3 g/dL — ABNORMAL LOW (ref 12.0–15.0)
MCH: 30.7 pg (ref 26.0–34.0)
MCHC: 32.6 g/dL (ref 30.0–36.0)
MCV: 94 fL (ref 80.0–100.0)
Platelets: 210 10*3/uL (ref 150–400)
RBC: 3.36 MIL/uL — ABNORMAL LOW (ref 3.87–5.11)
RDW: 14.3 % (ref 11.5–15.5)
WBC: 4.6 10*3/uL (ref 4.0–10.5)
nRBC: 0 % (ref 0.0–0.2)

## 2023-09-10 MED ORDER — APIXABAN 2.5 MG PO TABS
2.5000 mg | ORAL_TABLET | Freq: Two times a day (BID) | ORAL | Status: DC
  Administered 2023-09-10: 2.5 mg via ORAL
  Filled 2023-09-10: qty 1

## 2023-09-10 MED ORDER — AMOXICILLIN-POT CLAVULANATE 875-125 MG PO TABS
1.0000 | ORAL_TABLET | Freq: Two times a day (BID) | ORAL | 0 refills | Status: AC
  Filled 2023-09-10: qty 6, 3d supply, fill #0

## 2023-09-10 MED ORDER — POTASSIUM CHLORIDE CRYS ER 20 MEQ PO TBCR
40.0000 meq | EXTENDED_RELEASE_TABLET | Freq: Two times a day (BID) | ORAL | Status: DC
  Administered 2023-09-10: 40 meq via ORAL
  Filled 2023-09-10: qty 2

## 2023-09-10 MED ORDER — AZITHROMYCIN 500 MG PO TABS
500.0000 mg | ORAL_TABLET | Freq: Every evening | ORAL | 0 refills | Status: AC
  Filled 2023-09-10: qty 1, 1d supply, fill #0

## 2023-09-10 NOTE — Progress Notes (Signed)
 PHARMACY CONSULT NOTE - FOLLOW UP  Pharmacy Consult for Electrolyte Monitoring and Replacement   Recent Labs: Potassium (mmol/L)  Date Value  09/10/2023 3.5   Magnesium (mg/dL)  Date Value  16/02/9603 1.9   Calcium (mg/dL)  Date Value  54/01/8118 8.4 (L)   Albumin (g/dL)  Date Value  14/78/2956 3.9   Phosphorus (mg/dL)  Date Value  21/30/8657 3.6   Sodium (mmol/L)  Date Value  09/10/2023 137  08/30/2023 141     Assessment: 78 y.o. female with medical history significant for HFpEF, A-fib s/p watchman's and recent cardioversion on 09/06/2023, HTN, nonobstructive CAD, prior CVA, hospitalized from 2/21 to 07/30/2023 with a hip fracture from an accidental fall, stay complicated by orthostatic hypotension, who is being admitted with multifocal pneumonia and mild CHF with new oxygen requirement.   Meds: on lasix IV 40 mg BID, on amio 100 mg daily, on losartan 75 mg daily,   Goal of Therapy:  K+ > 4 and Mg > 2  Plan:  Will give Kcl 40 mEq x 2.  F/u with AM labs.   Sandra [name] ,PharmD Clinical Pharmacist 09/10/2023 8:09 AM

## 2023-09-10 NOTE — Progress Notes (Signed)
 Heart Failure Navigator Progress Note Assessed for Heart & Vascular TOC clinic readiness.  Patient with recent Cardioversion 09/06/2023 with acute HF exacerbation, EF 65-70%, A-Fib.  Has a Watchman device and will have a close follow-up with Suzann Riddle post hospitalization follow-up after discharge.  Navigator available for reassessment of patient if needed.  Will sign off att this time.  Celedonio Coil, RN, BSN [facility] Heart Failure Navigator Secure Chat Only

## 2023-09-10 NOTE — Discharge Summary (Signed)
 Sandra [name] ZOX:[id number] DOB: 07-Aug-1945 DOA: 09/08/2023  PCP: Louis Matte, MD  Admit date: 09/08/2023 Discharge date: 09/10/2023  Time spent: 35 minutes  Recommendations for Outpatient Follow-up:  Pcp f/u Cardiology f/u Pulmonology f/u     Discharge Diagnoses:  Principal Problem:   Multifocal pneumonia Active Problems:   Acute respiratory failure with hypoxia (HCC)   Acute on chronic heart failure with preserved ejection fraction (HFpEF) (HCC)   Atrial fibrillation S/p cardioversion 09/06/23 , prior watchman's device placement   Left leg swelling   Non-obstructive CAD (coronary artery disease)   History of stroke   Depression   Pulmonary nodules   Presence of Watchman left atrial appendage closure device   Tachycardia-bradycardia syndrome (HCC)   Hx of right mastectomy   Discharge Condition: improved  Diet recommendation: heart healthy  Filed Weights   09/08/23 1425 09/10/23 0500  Weight: 57.6 kg 58 kg    History of present illness:  From admission h and p Sandra [name] is a 78 y.o. female with medical history significant for HFpEF, A-fib s/p watchman's and recent cardioversion on 09/06/2023, HTN, nonobstructive CAD, prior CVA, hospitalized from 2/21 to 07/30/2023 with a hip fracture from an accidental fall, stay complicated by orthostatic hypotension, who is being admitted with multifocal pneumonia and mild CHF with new oxygen requirement.  Patient had been doing well since her cardioversion on 09/06/2023 and has been ambulatory without problems since her hip surgery but started developing shortness of breath and cough that awoke her from sleep the night prior and then dyspnea on exertion during the day today.  She denies chest pain or fever or chills.  She has mild swelling on the left leg without pain.   Hospital Course:   # CAP  Two days cough and shortness of breath, dry cough. Infiltrate on cxr. Symptomatically much improved? - treated with  ceftriaxone/azithromycin, will d/c on augmentin/azithromycin - d/c plan reviewed with patient's daughter michelle who is in agreement, all questions answered   # CHF exacerbation? Ddx of above includes chf exacerbation and indeed many of her symptoms (orthopnea, abruptness of onset, lack of constitutional symptoms) suggests chf. Did have recent cardioversion. TTE however preserved EF, no acute findings. Treated with IV lasix here. - close outpt cardiology f/u   # Acute hypoxic respiratory failure 2/2 above process. Now weaned off o2 and breathing comfortably   # A-fib S/p watchman device and DCCV cardioversion 4/10, in sinus currently - home amio - home apixaban    # Headache New, does have hx ICH and recent start of apixaban. Ct head here is reassuring.   # Pulmonary nodules May be infectious, will need outpt imaging, f/u pulmonology (dgayli)   # Hypokalemia resolved   # Rash In b/l LEs, none elsewhere - stable, consider derm f/u if persists   # GAD - home alprazolam, zoloft   # CAD No chest pain, normal trops - home statin   # Hypothyroid - home levo   # HTN Mild elevation - home losartan   # Debility # Recent hip fracture - PT consult, no barriers to discharge, continue outpt PT per them  Procedures: none   Consultations: none  Discharge Exam: Vitals:   09/10/23 0813 09/10/23 1121  BP: (!) 150/69 (!) 153/62  Pulse: 68 64  Resp:    Temp: 98.1 F (36.7 C) 98.2 F (36.8 C)  SpO2: 95% 99%    General exam: Appears calm and comfortable  Respiratory system: Clear to auscultation save  for rales at bases Cardiovascular system: S1 & S2 heard, RRR.   Gastrointestinal system: Abdomen is nondistended, soft and nontender.   Central nervous system: Alert and oriented. No focal neurological deficits. Extremities: Symmetric 5 x 5 power. Trace LE edema Skin: non-blanching erythematous fine petechial rash LEs Psychiatry: Judgement and insight appear normal. Mood  & affect appropriate.   Discharge Instructions   Discharge Instructions     Diet - low sodium heart healthy   Complete by: As directed    Increase activity slowly   Complete by: As directed       Allergies as of 09/10/2023   No Known Allergies      Medication List     STOP taking these medications    aspirin EC 81 MG tablet       TAKE these medications    acetaminophen 325 MG tablet Commonly known as: TYLENOL Take 2 tablets (650 mg total) by mouth every 6 (six) hours as needed for mild pain (pain score 1-3), fever or headache (or temp > 100.5). What changed:  how much to take reasons to take this   ALPRAZolam 0.25 MG tablet Commonly known as: XANAX Take 0.25 mg by mouth daily as needed. What changed: Another medication with the same name was removed. Continue taking this medication, and follow the directions you see here.   amiodarone 200 MG tablet Commonly known as: PACERONE Take 0.5 tablets (100 mg total) by mouth daily.   amoxicillin-clavulanate 875-125 MG tablet Commonly known as: AUGMENTIN Take 1 tablet by mouth 2 (two) times daily for 3 days.   apixaban 2.5 MG Tabs tablet Commonly known as: ELIQUIS Take 1 tablet (2.5 mg total) by mouth 2 (two) times daily.   atorvastatin 40 MG tablet Commonly known as: LIPITOR Take 1 tablet (40 mg total) by mouth daily.   azithromycin 500 MG tablet Commonly known as: ZITHROMAX Take one pill the evening of 4/14   bisacodyl 5 MG EC tablet Generic drug: bisacodyl Take 5 mg by mouth daily as needed for moderate constipation.   brimonidine 0.2 % ophthalmic solution Commonly known as: ALPHAGAN Place 1 drop into both eyes 2 (two) times daily.   CALCIUM 600 + D PO Take 1 tablet by mouth daily.   docusate sodium 100 MG capsule Commonly known as: COLACE Take 100 mg by mouth daily.   ferrous sulfate 325 (65 FE) MG tablet Take 325 mg by mouth daily with breakfast.   hydrALAZINE 50 MG tablet Commonly known as:  APRESOLINE Take 1 tablet (50 mg total) by mouth 3 (three) times daily as needed (SBP >160).   latanoprost 0.005 % ophthalmic solution Commonly known as: XALATAN Place 1 drop into both eyes at bedtime.   levothyroxine 100 MCG tablet Commonly known as: SYNTHROID TAKE 1 TABLET BY MOUTH 30  MINUTES BEFORE BREAKFAST   losartan 50 MG tablet Commonly known as: COZAAR Take 1.5 tablets (75 mg total) by mouth daily.   metoprolol tartrate 25 MG tablet Commonly known as: LOPRESSOR Take 0.5 tablets (12.5 mg total) by mouth as needed (Afib episodes with HR > 120bpm.).   multivitamin with minerals Tabs tablet Take 1 tablet by mouth daily. Centrum Silver   omeprazole 40 MG capsule Commonly known as: PRILOSEC TAKE 1 CAPSULE BY MOUTH DAILY   potassium chloride 20 MEQ packet Commonly known as: KLOR-CON DISSOLVE 2 PACKETS IN 4 OZ WATER OR JUICE AND DRINK DAILY   sertraline 100 MG tablet Commonly known as: ZOLOFT Take 100 mg by  mouth daily.   simethicone 80 MG chewable tablet Commonly known as: MYLICON Chew 80 mg by mouth every 6 (six) hours as needed for flatulence.   vitamin B-12 500 MCG tablet Commonly known as: CYANOCOBALAMIN Take 1 tablet (500 mcg total) by mouth daily.   Vitamin D3 125 MCG (5000 UT) Tabs Take 5,000 Units by mouth daily.       No Known Allergies  Follow-up Information     [name], Iantha Mainland, MD Follow up.   Specialty: Internal Medicine Contact information: [address] [place] [address] [phone]         Vergia Glasgow, MD Follow up.   Specialty: Pulmonary Disease Contact information: [address] [place] [address] [phone]                  The results of significant diagnostics from this hospitalization (including imaging, microbiology, ancillary and laboratory) are listed below for reference.    Significant Diagnostic Studies: ECHOCARDIOGRAM COMPLETE Result Date: 09/10/2023    ECHOCARDIOGRAM REPORT    Patient Name:   MARILI VADER Date of Exam: 09/09/2023 Medical Rec #:  [id number]      Height:       63.0 in Accession #:    [id number]     Weight:       127.0 lb Date of Birth:  [birth year]     BSA:          1.594 m Patient Age:    77 years       BP:           163/66 mmHg Patient Gender: F              HR:           74 bpm. Exam Location:  [facility] Procedure: 2D Echo, Cardiac Doppler and Color Doppler (Both Spectral and Color            Flow Doppler were utilized during procedure). Indications:     CHF I50.31  History:         Patient has prior history of Echocardiogram examinations, most                  recent 02/21/2023.  Sonographer:     Clenton Czech RDCS, FASE Referring Phys:  [id number] Lanetta Pion Diagnosing Phys: Sammy Crisp MD IMPRESSIONS  1. Left ventricular ejection fraction, by estimation, is 65 to 70%. The left ventricle has normal function. The left ventricle has no regional wall motion abnormalities. There is mild left ventricular hypertrophy. Left ventricular diastolic parameters are indeterminate.  2. Right ventricular systolic function is normal. The right ventricular size is normal. There is moderately elevated pulmonary artery systolic pressure. The estimated right ventricular systolic pressure is 49.2 mmHg.  3. The mitral valve is degenerative. Mild to moderate mitral valve regurgitation. No evidence of mitral stenosis.  4. Tricuspid valve regurgitation is mild to moderate.  5. The aortic valve is tricuspid. There is mild thickening of the aortic valve. Aortic valve regurgitation is not visualized. Aortic valve sclerosis is present, with no evidence of aortic valve stenosis.  6. The inferior vena cava is normal in size with greater than 50% respiratory variability, suggesting right atrial pressure of 3 mmHg. FINDINGS  Left Ventricle: Left ventricular ejection fraction, by estimation, is 65 to 70%. The left ventricle has normal function. The left ventricle has no regional wall motion  abnormalities. The left ventricular internal cavity size was normal in size.  There is  mild left ventricular hypertrophy. Left ventricular diastolic parameters are indeterminate. Right Ventricle: The right ventricular size is normal. No increase in right ventricular wall thickness. Right ventricular systolic function is normal. There is moderately elevated pulmonary artery systolic pressure. The tricuspid regurgitant velocity is 3.40 m/s, and with an assumed right atrial pressure of 3 mmHg, the estimated right ventricular systolic pressure is 49.2 mmHg. Left Atrium: Left atrial size was normal in size. Right Atrium: Right atrial size was normal in size. Pericardium: There is no evidence of pericardial effusion. Mitral Valve: The mitral valve is degenerative in appearance. There is mild thickening of the mitral valve leaflet(s). Mild to moderate mitral valve regurgitation. No evidence of mitral valve stenosis. Tricuspid Valve: The tricuspid valve is not well visualized. Tricuspid valve regurgitation is mild to moderate. Aortic Valve: The aortic valve is tricuspid. There is mild thickening of the aortic valve. Aortic valve regurgitation is not visualized. Aortic valve sclerosis is present, with no evidence of aortic valve stenosis. Aortic valve peak gradient measures 13.7 mmHg. Pulmonic Valve: The pulmonic valve was normal in structure. Pulmonic valve regurgitation is mild. No evidence of pulmonic stenosis. Aorta: The aortic root and ascending aorta are structurally normal, with no evidence of dilitation. Pulmonary Artery: The pulmonary artery is of normal size. Venous: The inferior vena cava is normal in size with greater than 50% respiratory variability, suggesting right atrial pressure of 3 mmHg. IAS/Shunts: The interatrial septum was not well visualized.  LEFT VENTRICLE PLAX 2D LVIDd:         4.70 cm   Diastology LVIDs:         2.90 cm   LV e' medial:    10.20 cm/s LV PW:         1.20 cm   LV E/e' medial:  9.9 LV  IVS:        1.30 cm   LV e' lateral:   10.30 cm/s LVOT diam:     1.80 cm   LV E/e' lateral: 9.8 LV SV:         68 LV SV Index:   42 LVOT Area:     2.54 cm  RIGHT VENTRICLE RV Basal diam:  3.20 cm RV S prime:     13.90 cm/s TAPSE (M-mode): 2.2 cm LEFT ATRIUM             Index        RIGHT ATRIUM           Index LA diam:        3.90 cm 2.45 cm/m   RA Area:     15.47 cm LA Vol (A2C):   27.6 ml 17.31 ml/m  RA Volume:   39.32 ml  24.66 ml/m LA Vol (A4C):   30.7 ml 19.26 ml/m LA Biplane Vol: 30.4 ml 19.07 ml/m  AORTIC VALVE                 PULMONIC VALVE AV Area (Vmax): 2.02 cm     PV Vmax:          1.27 m/s AV Vmax:        185.00 cm/s  PV Peak grad:     6.5 mmHg AV Peak Grad:   13.7 mmHg    PR End Diast Vel: 4.58 msec LVOT Vmax:      147.00 cm/s  RVOT Peak grad:   6 mmHg LVOT Vmean:     97.600 cm/s LVOT VTI:  0.266 m  AORTA Ao Root diam: 3.00 cm Ao Asc diam:  2.80 cm MITRAL VALVE                TRICUSPID VALVE MV Area (PHT): 2.96 cm     TR Peak grad:   46.2 mmHg MV Decel Time: 256 msec     TR Vmax:        340.00 cm/s MV E velocity: 101.00 cm/s MV A velocity: 32.80 cm/s   SHUNTS MV E/A ratio:  3.08         Systemic VTI:  0.27 m                             Systemic Diam: 1.80 cm Yvonne Kendall MD Electronically signed by Yvonne Kendall MD Signature Date/Time: 09/10/2023/11:47:18 AM    Final    CT HEAD WO CONTRAST ( ) Result Date: 09/09/2023 CLINICAL DATA:  Headache EXAM: CT HEAD WITHOUT CONTRAST TECHNIQUE: Contiguous axial images were obtained from the base of the skull through the vertex without intravenous contrast. RADIATION DOSE REDUCTION: This exam was performed according to the departmental dose-optimization program which includes automated exposure control, adjustment of the mA and/or kV according to patient size and/or use of iterative reconstruction technique. COMPARISON:  06/06/2023 FINDINGS: Brain: No evidence of acute infarction, hemorrhage, hydrocephalus, extra-axial collection or mass  lesion/mass effect. Mild cerebral volume loss and white matter low-density for age Vascular: No hyperdense vessel or unexpected calcification. Skull: Normal. Negative for fracture or focal lesion. Sinuses/Orbits: No acute finding.  No covered sinusitis IMPRESSION: Aging brain without acute finding or specific cause for headache. Electronically Signed   By: Tiburcio Pea M.D.   On: 09/09/2023 09:32   US Venous Img Lower Unilateral Left Result Date: 09/08/2023 CLINICAL DATA:  Swelling EXAM: LEFT LOWER EXTREMITY VENOUS DOPPLER ULTRASOUND TECHNIQUE: Gray-scale sonography with compression, as well as color and duplex ultrasound, were performed to evaluate the deep venous system(s) from the level of the common femoral vein through the popliteal and proximal calf veins. COMPARISON:  Contralateral 08/18/2019 FINDINGS: VENOUS Normal compressibility of the common femoral, superficial femoral, and popliteal veins, as well as the visualized calf veins. Visualized portions of profunda femoral vein and great saphenous vein unremarkable. No filling defects to suggest DVT on grayscale or color Doppler imaging. Doppler waveforms show normal direction of venous flow, normal respiratory plasticity and response to augmentation. Limited views of the contralateral common femoral vein are unremarkable. OTHER None. Limitations: none IMPRESSION: Negative. Electronically Signed   By: Corlis Leak M.D.   On: 09/08/2023 17:05   CT Angio Chest PE W and/or Wo Contrast Result Date: 09/08/2023 CLINICAL DATA:  Shortness of breath and fatigue. Leg swelling. Evaluate for PE. EXAM: CT ANGIOGRAPHY CHEST WITH CONTRAST TECHNIQUE: Multidetector CT imaging of the chest was performed using the standard protocol during bolus administration of intravenous contrast. Multiplanar CT image reconstructions and MIPs were obtained to evaluate the vascular anatomy. RADIATION DOSE REDUCTION: This exam was performed according to the departmental dose-optimization  program which includes automated exposure control, adjustment of the mA and/or kV according to patient size and/or use of iterative reconstruction technique. CONTRAST:  75mL OMNIPAQUE IOHEXOL 350 MG/ML SOLN COMPARISON:  Chest CT 04/21/2022 FINDINGS: Cardiovascular: There is adequate opacification of the pulmonary arteries. No pulmonary embolism identified. The heart is mildly enlarged. Aorta is nondilated. There are atherosclerotic calcifications of the aorta. Left atrial device again noted. There is no pericardial effusion. Mediastinum/Nodes:  No enlarged mediastinal, hilar, or axillary lymph nodes. Thyroid gland, trachea, and esophagus demonstrate no significant findings. Lungs/Pleura: There are small bilateral pleural effusions, right greater than left. There are patchy and confluent air areas of ground-glass throughout both lungs. There is an air trapping in the lung bases similar to prior. There also minimal patchy airspace opacities in the bilateral upper lobes. There is a 5 mm left lower lobe pulmonary nodule image 6/106 (previously measuring 4 mm). There is a 5 mm nodule in the left lower lobe image 6/100 (previously measuring 3 mm). There are new ill-defined slightly nodular densities scattered throughout the right upper lobe measuring up to 8 mm. Upper Abdomen: No acute abnormality. Musculoskeletal: No chest wall abnormality. No acute or significant osseous findings. Review of the MIP images confirms the above findings. IMPRESSION: 1. No evidence for pulmonary embolism. 2. Small bilateral pleural effusions, right greater than left. 3. Patchy and confluent areas of ground-glass throughout both lungs, new from prior, worrisome for multifocal pneumonia. 4. New ill-defined slightly nodular densities scattered throughout the right upper lobe measuring up to 8 mm, likely infectious/inflammatory. 5. Stable air trapping in the lung bases which can be seen with small airways disease. 6. Nodular densities in the left  lower lobe measuring up to 5 mm have mildly increased in size compared to 2023 and are indeterminate. Continued follow-up recommended. 7. Mild cardiomegaly. 8. Aortic atherosclerosis. Electronically Signed   By: Tyron Gallon M.D.   On: 09/08/2023 16:12   DG Chest 2 View Result Date: 09/08/2023 CLINICAL DATA:  Shortness of breath EXAM: CHEST - 2 VIEW COMPARISON:  X-ray 07/22/2023 and older FINDINGS: Loop recorder. Enlarged cardiopericardial silhouette with calcified aorta. Atrial appendage occlusion device. Vascular congestion. No pneumothorax, effusion or edema. No consolidation. Degenerative changes of the spine. IMPRESSION: Stable mild enlargement of the cardiac silhouette with vascular congestion. Loop recorder. Atrial appendage occlusion device. Electronically Signed   By: Adrianna Horde M.D.   On: 09/08/2023 15:13   CUP PACEART REMOTE DEVICE CHECK Result Date: 08/28/2023 ILR summary report received. Battery status OK. Normal device function. No new symptom, tachy, brady, or pause episodes.  108 AF episodes, known PAF, Watchman per PA, longest 12 hrs 52 min, AF burden 23.6%. To be seen in [facility].  AF previously viewed/reviewed.  Monthly summary reports and ROV/PRN ML, CVRS   Microbiology: Recent Results (from the past 240 hours)  Resp panel by RT-PCR (RSV, Flu A&B, Covid) Anterior Nasal Swab     Status: None   Collection Time: 09/08/23  3:45 PM   Specimen: Anterior Nasal Swab  Result Value Ref Range Status   SARS Coronavirus 2 by RT PCR NEGATIVE NEGATIVE Final    Comment: (NOTE) SARS-CoV-2 target nucleic acids are NOT DETECTED.  The SARS-CoV-2 RNA is generally detectable in upper respiratory specimens during the acute phase of infection. The lowest concentration of SARS-CoV-2 viral copies this assay can detect is 138 copies/mL. A negative result does not preclude SARS-Cov-2 infection and should not be used as the sole basis for treatment or other patient management decisions. A negative result  may occur with  improper specimen collection/handling, submission of specimen other than nasopharyngeal swab, presence of viral mutation(s) within the areas targeted by this assay, and inadequate number of viral copies(<138 copies/mL). A negative result must be combined with clinical observations, patient history, and epidemiological information. The expected result is Negative.  Fact Sheet for Patients:  BloggerCourse.com  Fact Sheet for Healthcare Providers:  SeriousBroker.it  This test  is no t yet approved or cleared by the [place] and  has been authorized for detection and/or diagnosis of SARS-CoV-2 by FDA under an Emergency Use Authorization (EUA). This EUA will remain  in effect (meaning this test can be used) for the duration of the COVID-19 declaration under Section 564(b)(1) of the Act, 21 U.S.C.section 360bbb-3(b)(1), unless the authorization is terminated  or revoked sooner.       Influenza A by PCR NEGATIVE NEGATIVE Final   Influenza B by PCR NEGATIVE NEGATIVE Final    Comment: (NOTE) The Xpert Xpress SARS-CoV-2/FLU/RSV plus assay is intended as an aid in the diagnosis of influenza from Nasopharyngeal swab specimens and should not be used as a sole basis for treatment. Nasal washings and aspirates are unacceptable for Xpert Xpress SARS-CoV-2/FLU/RSV testing.  Fact Sheet for Patients: BloggerCourse.com  Fact Sheet for Healthcare Providers: SeriousBroker.it  This test is not yet approved or cleared by the [place] FDA and has been authorized for detection and/or diagnosis of SARS-CoV-2 by FDA under an Emergency Use Authorization (EUA). This EUA will remain in effect (meaning this test can be used) for the duration of the COVID-19 declaration under Section 564(b)(1) of the Act, 21 U.S.C. section 360bbb-3(b)(1), unless the authorization is terminated  or revoked.     Resp Syncytial Virus by PCR NEGATIVE NEGATIVE Final    Comment: (NOTE) Fact Sheet for Patients: BloggerCourse.com  Fact Sheet for Healthcare Providers: SeriousBroker.it  This test is not yet approved or cleared by the [place] FDA and has been authorized for detection and/or diagnosis of SARS-CoV-2 by FDA under an Emergency Use Authorization (EUA). This EUA will remain in effect (meaning this test can be used) for the duration of the COVID-19 declaration under Section 564(b)(1) of the Act, 21 U.S.C. section 360bbb-3(b)(1), unless the authorization is terminated or revoked.  Performed at [facility], [address] [place], [place] [address]   Respiratory (~20 pathogens) panel by PCR     Status: None   Collection Time: 09/09/23  8:38 AM   Specimen: Nasopharyngeal Swab; Respiratory  Result Value Ref Range Status   Adenovirus NOT DETECTED NOT DETECTED Final   Coronavirus 229E NOT DETECTED NOT DETECTED Final    Comment: (NOTE) The Coronavirus on the Respiratory Panel, DOES NOT test for the novel  Coronavirus (2019 nCoV)    Coronavirus HKU1 NOT DETECTED NOT DETECTED Final   Coronavirus NL63 NOT DETECTED NOT DETECTED Final   Coronavirus OC43 NOT DETECTED NOT DETECTED Final   Metapneumovirus NOT DETECTED NOT DETECTED Final   Rhinovirus / Enterovirus NOT DETECTED NOT DETECTED Final   Influenza A NOT DETECTED NOT DETECTED Final   Influenza B NOT DETECTED NOT DETECTED Final   Parainfluenza Virus 1 NOT DETECTED NOT DETECTED Final   Parainfluenza Virus 2 NOT DETECTED NOT DETECTED Final   Parainfluenza Virus 3 NOT DETECTED NOT DETECTED Final   Parainfluenza Virus 4 NOT DETECTED NOT DETECTED Final   Respiratory Syncytial Virus NOT DETECTED NOT DETECTED Final   Bordetella pertussis NOT DETECTED NOT DETECTED Final   Bordetella Parapertussis NOT DETECTED NOT DETECTED Final   Chlamydophila pneumoniae NOT  DETECTED NOT DETECTED Final   Mycoplasma pneumoniae NOT DETECTED NOT DETECTED Final    Comment: Performed at [facility], [address] [place], [place] [address]  MRSA Next Gen by PCR, Nasal     Status: None   Collection Time: 09/09/23  8:38 AM   Specimen: Nasopharyngeal Swab; Nasal Swab  Result  Value Ref Range Status   MRSA by PCR Next Gen NOT DETECTED NOT DETECTED Final    Comment: (NOTE) The GeneXpert MRSA Assay (FDA approved for NASAL specimens only), is one component of a comprehensive MRSA colonization surveillance program. It is not intended to diagnose MRSA infection nor to guide or monitor treatment for MRSA infections. Test performance is not FDA approved in patients less than 53 years old. Performed at [facility], [address] [place], [place] [address]      Labs: Basic Metabolic Panel: Recent Labs  Lab 09/08/23 1426 09/09/23 0508 09/09/23 1357 09/10/23 0315  NA 138 138  --  137  K 3.7 2.9* 3.7 3.5  CL 106 102  --  102  CO2 26 27  --  25  GLUCOSE 100* 112*  --  96  BUN 15 14  --  18  CREATININE 0.64 0.56  --  0.72  CALCIUM 8.5* 8.2*  --  8.4*  MG  --  1.9  --   --    Liver Function Tests: No results for input(s): "AST", "ALT", "ALKPHOS", "BILITOT", "PROT", "ALBUMIN" in the last 168 hours. No results for input(s): "LIPASE", "AMYLASE" in the last 168 hours. No results for input(s): "AMMONIA" in the last 168 hours. CBC: Recent Labs  Lab 09/08/23 1426 09/09/23 0508 09/10/23 0315  WBC 6.5 5.6 4.6  HGB 10.2* 9.6* 10.3*  HCT 32.0* 30.0* 31.6*  MCV 95.2 95.8 94.0  PLT 218 191 210   Cardiac Enzymes: No results for input(s): "CKTOTAL", "CKMB", "CKMBINDEX", "TROPONINI" in the last 168 hours. BNP: BNP (last 3 results) Recent Labs    09/08/23 1426  BNP 225.4*    ProBNP (last 3 results) No results for input(s): "PROBNP" in the last 8760 hours.  CBG: No results for input(s): "GLUCAP" in the last 168  hours.     Signed:  Raymonde Calico MD.  [facility] 09/10/2023, 12:53 PM

## 2023-09-11 LAB — LEGIONELLA PNEUMOPHILA SEROGP 1 UR AG: L. pneumophila Serogp 1 Ur Ag: NEGATIVE

## 2023-09-18 ENCOUNTER — Other Ambulatory Visit: Payer: Self-pay

## 2023-09-24 NOTE — Progress Notes (Unsigned)
 Electrophysiology Clinic Note    Date:  09/25/2023  Patient ID:  Kellene, Randolf 1945/10/07, MRN [id number] PCP:  Benuel Brazier, MD  Cardiologist:  Sammy Crisp, MD Electrophysiologist: Boyce Byes, MD   Discussed the use of AI scribe software for clinical note transcription with the patient, who gave verbal consent to proceed.   Patient Profile    Chief Complaint: afib follow-up  History of Present Illness: WILHELMINIA LOE is a 78 y.o. female with PMH notable for parox AFib, tachy-brady, s/p LAAO (Watchman), frequent falls; seen today for Boyce Byes, MD for routine EP follow-up.    She is s/p ILR implant for ongoing afib surveillance and falls.   She last saw Dr. Marven Slimmer 06/2023. She has continued to have falls without appreciable pauses on her ILR. Falls likely 2/2 gait instability and prior stroke. Afib burden overall low, she was continued on 100mg  amiodarone  daily and PRN lopressor .  She had fall 06/2023 where she required hip surgery the following day.   Device clinic was alerted late March 2025 for increased AFib burden. I saw her in follow-up where was in persis AFib via ILR and she is now s/p DCCV 4/3. She then went to ER and was diag with multifocal PNA on 4/13.   On follow-up today, she is doing well from a cardiac standpoint, no AFib since her DCCV that she is aware of. She is tolerating eliquis  well, but is nervous about being on it and wants to stop as soon as it is appropriate. She is diligently using walker when out of the house and cane when in the house, no recent falls.   Arrhythmia/Device History MDT ILR, imp 09/2022; dx afib monitoring   AAD -  Amiodarone     ROS:  Please see the history of present illness. All other systems are reviewed and otherwise negative.    Physical Exam    VS:  BP 134/68 (BP Location: Left Arm, Patient Position: Sitting, Cuff Size: Normal)   Pulse 62   Ht 5\' 3"  (1.6 m)   Wt 124 lb (56.2 kg)    SpO2 95%   BMI 21.97 kg/m  BMI: Body mass index is 21.97 kg/m.  Wt Readings from Last 3 Encounters:  09/25/23 124 lb (56.2 kg)  09/10/23 127 lb 13.9 oz (58 kg)  09/06/23 127 lb (57.6 kg)     GEN- The patient is well appearing, alert and oriented x 3 today.   Lungs- Clear to ausculation bilaterally, normal work of breathing.  Heart- Regular rate and rhythm, no murmurs, rubs or gallops Extremities- No peripheral edema, warm, dry Skin-  ILR insertion site well-healed.    4/29 remote interrogation reviewed -  No AFib since 4/10 Ventricular rates 60-70s in sinus No pauses  Studies Reviewed   Previous EP, cardiology notes.    EKG is ordered. Personal review of EKG from today shows:    EKG Interpretation Date/Time:  Tuesday September 25 2023 14:05:33 EDT Ventricular Rate:  62 PR Interval:  188 QRS Duration:  96 QT Interval:  440 QTC Calculation: 446 R Axis:   -12  Text Interpretation: Normal sinus rhythm Left ventricular hypertrophy with repolarization abnormality ( R in aVL , Sokolow-Lyon , Cornell product , Romhilt-Estes ) Confirmed by Conception Doebler [phone]) on 09/25/2023 3:39:29 PM    TTE, 09/09/2023 1. Left ventricular ejection fraction, by estimation, is 65 to 70%. The left ventricle has normal function. The left ventricle has no regional wall  motion abnormalities. There is mild left ventricular hypertrophy. Left ventricular diastolic parameters are indeterminate.   2. Right ventricular systolic function is normal. The right ventricular size is normal. There is moderately elevated pulmonary artery systolic pressure. The estimated right ventricular systolic pressure is 49.2 mmHg.   3. The mitral valve is degenerative. Mild to moderate mitral valve regurgitation. No evidence of mitral stenosis.   4. Tricuspid valve regurgitation is mild to moderate.   5. The aortic valve is tricuspid. There is mild thickening of the aortic valve. Aortic valve regurgitation is not visualized. Aortic  valve sclerosis is present, with no evidence of aortic valve stenosis.   6. The inferior vena cava is normal in size with greater than 50% respiratory variability, suggesting right atrial pressure of 3 mmHg.   TTE, 04/22/2022  1. Left ventricular ejection fraction, by estimation, is 60 to 65%. The left ventricle has normal function. The left ventricle has no regional wall motion abnormalities. There is mild left ventricular hypertrophy. Left ventricular diastolic parameters are indeterminate.   2. Right ventricular systolic function is normal. The right ventricular size is normal.   3. Left atrial size was moderately dilated.   4. The mitral valve is normal in structure. Mild mitral valve regurgitation.   5. The aortic valve is tricuspid. Aortic valve regurgitation is not visualized.   6. The inferior vena cava is normal in size with greater than 50% respiratory variability, suggesting right atrial pressure of 3 mmHg.    TEE, 03/10/2021 1. 24 mm Watchman FLX left atrial appendage occluder well seated, no residual flow. . Left atrial size was moderately dilated. No left atrial/left atrial appendage thrombus was detected.   2. Left ventricular ejection fraction, by estimation, is 60 to 65%. The left ventricle has normal function.   3. Right ventricular systolic function is normal. The right ventricular size is normal.   4. Right atrial size was moderately dilated.   5. The mitral valve is grossly normal.   6. The aortic valve is normal in structure. Aortic valve regurgitation is trivial.   7. There is mild (Grade II) atheroma plaque involving the transverse aorta.   8. Evidence of atrial level shunting detected by color flow Doppler. There is a small iatrogenic mid septal atrial septal defect with predominantly left to right shunting across the atrial septum.     Assessment and Plan     #) persis AFib #) atrial flutter #) amiodarone  monitoring Historically low burden of afib on 100mg   amiodarone  daily S/p DCCV 4/10 and maintaining sinus since Continue 100mg  amiodarone  daily Recent LFTs and thyroid  labs stable Continue 12.5mg  lopressor  PRN for sustained HR above 120   #) Hypercoag d/t parox afib #) s/p LAAO with Watchman device TEE shows watchman is well-seated without leak CHA2DS2-VASc Score = at least 5 [CHF History: 0, HTN History: 1, Diabetes History: 0, Stroke History: 0, Vascular Disease History: 1, Age Score: 2, Gender Score: 1].  Therefore, the patient's annual risk of stroke is 7.2 %.    Continue eliquis  until 5/10 at which point she will be 1 month post-DCCV, then resume 81mg  ASA daily  #) frequent falls #) bradycardia #) ILR in situ No significant pauses or severe bradycardic events  #) diastolic HF #) possible LVH cMRI planned later this week to assess LV dimensions, LGE      Current medicines are reviewed at length with the patient today.   The patient does not have concerns regarding her medicines.  The following  changes were made today:   STOP eliquis  5/10, then start 81mg  ASA   Labs/ tests ordered today include:  Orders Placed This Encounter  Procedures   EKG 12-Lead     Disposition: Follow up with Dr. Marven Slimmer or EP APP in 6 months   Signed, Adaline Holly, NP  09/25/23  3:39 PM  Electrophysiology [facility]

## 2023-09-25 ENCOUNTER — Encounter (HOSPITAL_COMMUNITY): Payer: Self-pay

## 2023-09-25 ENCOUNTER — Ambulatory Visit: Payer: Medicare (Managed Care) | Attending: Cardiology | Admitting: Cardiology

## 2023-09-25 VITALS — BP 134/68 | HR 62 | Ht 63.0 in | Wt 124.0 lb

## 2023-09-25 DIAGNOSIS — Z95818 Presence of other cardiac implants and grafts: Secondary | ICD-10-CM | POA: Diagnosis not present

## 2023-09-25 DIAGNOSIS — R296 Repeated falls: Secondary | ICD-10-CM | POA: Diagnosis not present

## 2023-09-25 DIAGNOSIS — I4891 Unspecified atrial fibrillation: Secondary | ICD-10-CM | POA: Diagnosis not present

## 2023-09-25 DIAGNOSIS — R001 Bradycardia, unspecified: Secondary | ICD-10-CM

## 2023-09-25 DIAGNOSIS — Z79899 Other long term (current) drug therapy: Secondary | ICD-10-CM

## 2023-09-25 NOTE — Patient Instructions (Signed)
 Medication Instructions:  STOP Eliquis  May 10th   *If you need a refill on your cardiac medications before your next appointment, please call your pharmacy*  Follow-Up: At [facility], you and your health needs are our priority.  As part of our continuing mission to provide you with exceptional heart care, our providers are all part of one team.  This team includes your primary Cardiologist (physician) and Advanced Practice Providers or APPs (Physician Assistants and Nurse Practitioners) who all work together to provide you with the care you need, when you need it.  Your next appointment:   6 month(s)  Provider:   Harvie Liner, MD or Suzann Riddle, NP    We recommend signing up for the patient portal called "MyChart".  Sign up information is provided on this After Visit Summary.  MyChart is used to connect with patients for Virtual Visits (Telemedicine).  Patients are able to view lab/test results, encounter notes, upcoming appointments, etc.  Non-urgent messages can be sent to your provider as well.   To learn more about what you can do with MyChart, go to [email or web address].

## 2023-09-26 ENCOUNTER — Other Ambulatory Visit: Payer: Self-pay | Admitting: Cardiology

## 2023-09-26 ENCOUNTER — Ambulatory Visit
Admission: RE | Admit: 2023-09-26 | Discharge: 2023-09-26 | Disposition: A | Discharge status: Home / Self Care | Payer: Medicare (Managed Care) | Source: Ambulatory Visit | Attending: Cardiology | Admitting: Cardiology

## 2023-09-26 DIAGNOSIS — R42 Dizziness and giddiness: Secondary | ICD-10-CM

## 2023-09-26 DIAGNOSIS — R55 Syncope and collapse: Secondary | ICD-10-CM

## 2023-09-26 DIAGNOSIS — I5032 Chronic diastolic (congestive) heart failure: Secondary | ICD-10-CM

## 2023-09-26 DIAGNOSIS — I495 Sick sinus syndrome: Secondary | ICD-10-CM | POA: Diagnosis present

## 2023-09-26 MED ORDER — GADOBUTROL 1 MMOL/ML IV SOLN
9.0000 mL | Freq: Once | INTRAVENOUS | Status: AC | PRN
  Administered 2023-09-26: 9 mL via INTRAVENOUS

## 2023-09-27 ENCOUNTER — Ambulatory Visit
Admission: RE | Admit: 2023-09-27 | Discharge: 2023-09-27 | Disposition: A | Discharge status: Home / Self Care | Payer: Medicare (Managed Care) | Source: Ambulatory Visit | Attending: Nurse Practitioner | Admitting: Nurse Practitioner

## 2023-09-27 ENCOUNTER — Ambulatory Visit
Admission: RE | Admit: 2023-09-27 | Discharge: 2023-09-27 | Disposition: A | Discharge status: Home / Self Care | Payer: Medicare (Managed Care) | Attending: Nurse Practitioner | Admitting: Nurse Practitioner

## 2023-09-27 ENCOUNTER — Ambulatory Visit: Payer: Medicare (Managed Care) | Admitting: Nurse Practitioner

## 2023-09-27 ENCOUNTER — Encounter: Payer: Self-pay | Admitting: Nurse Practitioner

## 2023-09-27 VITALS — BP 126/60 | HR 67 | Temp 97.7°F | Ht 63.0 in | Wt 122.2 lb

## 2023-09-27 DIAGNOSIS — J90 Pleural effusion, not elsewhere classified: Secondary | ICD-10-CM | POA: Insufficient documentation

## 2023-09-27 DIAGNOSIS — J189 Pneumonia, unspecified organism: Secondary | ICD-10-CM

## 2023-09-27 DIAGNOSIS — R918 Other nonspecific abnormal finding of lung field: Secondary | ICD-10-CM | POA: Diagnosis not present

## 2023-09-27 DIAGNOSIS — J302 Other seasonal allergic rhinitis: Secondary | ICD-10-CM

## 2023-09-27 DIAGNOSIS — R197 Diarrhea, unspecified: Secondary | ICD-10-CM

## 2023-09-27 DIAGNOSIS — I272 Pulmonary hypertension, unspecified: Secondary | ICD-10-CM

## 2023-09-27 DIAGNOSIS — Z853 Personal history of malignant neoplasm of breast: Secondary | ICD-10-CM

## 2023-09-27 DIAGNOSIS — J9600 Acute respiratory failure, unspecified whether with hypoxia or hypercapnia: Secondary | ICD-10-CM

## 2023-09-27 DIAGNOSIS — I4891 Unspecified atrial fibrillation: Secondary | ICD-10-CM

## 2023-09-27 DIAGNOSIS — J309 Allergic rhinitis, unspecified: Secondary | ICD-10-CM

## 2023-09-27 MED ORDER — FLUTICASONE PROPIONATE 50 MCG/ACT NA SUSP: 2.0000 | Freq: Every day | NASAL | 2 refills | Status: DC

## 2023-09-27 NOTE — Patient Instructions (Signed)
 Glad you are feeling better!  Keep an eye on your cough and breathing You do have some evidence of postnasal drainage, which can contribute to a cough. Recommend you use flonase  nasal spray 2 sprays each nostril daily to help with this   Repeat chest x ray today  CT chest in 8 weeks to reassess nodules  Let your primary care doctor know about the diarrhea so they can do some stool testing since you were on antibiotics and in the hospital. Need to ensure you have not contracted a bacteria in the gut called C diff. Make sure you're staying well hydrated.   Follow up in 9-10 weeks with Dr. Darnelle [name] or Sandra Jerry [place],NP to review CT results. If symptoms do not improve or worsen, please contact office for sooner follow up or seek emergency care.

## 2023-09-27 NOTE — Progress Notes (Signed)
 @Patient  ID: Sandra [name], female    DOB: 21-Jun-1945, 78 y.o.   MRN: [id number]  Chief Complaint  Patient presents with   Follow-up    Occasional dry cough.     Referring provider: Benuel Brazier  HPI: 78 year old female, never smoker followed for lung nodules and shortness of breath. She is a patient of Dr. Para Bold and last seen in office 05/15/2023. Past medical history significant for CHF, hx of a fib s/p Watchman's device on amiodarone  (not on AC), HTN, CAD, Raynaud's, tachy-brady syndrome, GERD, NAFLD, hypothyroid, hx of CVA, anxiety, depression, glaucoma, HLD, prediabetes, RLS,   TEST/EVENTS:  12/04/2019 PFT: FVC 73, FEV1 79, ratio 84, TLC 77, DLCOcor 87. No BD 09/08/2023 CTA chest: no PE. Mild cardiomegaly. Atherosclerosis. Small b/l pleural effusions, R>L. Patchy and confluent areas of ground glass throughout both lungs. Air trapping, similar. Minimal airspace opacities in b/l upper lobes. 5 mm LLL nodule, previously 4 mm. 5 mm LLL nodule, previously 3 mm. New ill defined slightly nodular densities throughout RUL, measuring up to 8 mm.  09/09/2023 echo: EF 65-70%. Mild LVH. Indeterminate diastolic parameters. RV size and function nl. Moderately elevated PASP. Mild to moderate MR. Mild to moderate TR.   05/15/2023: OV with Dr. Darnelle Elders. SOB in setting of a fib as well as HF with preserved EF. PFT in 2021 which were overall normal; possible minimal obstruction on flow volume loop. Mosaic attenuation on prior CT imaging; obstructive small airway disease such as asthma or bronchiolitis a swell as pulmonary vascular disease such as PH, PVOD, CTEPH. HRCT and repeat PFT has not been completed although previously ordered. Reports feeling better. Would like to defer HRCT and PFT. Follow up PRN.   09/27/2023: Today - hospital follow up Discussed the use of AI scribe software for clinical note transcription with the patient, who gave verbal consent to proceed.  Patient presents today for  hospital follow up. She was admitted 2/21-07/30/2023 following a hip fracture from an accidental fall, stay complicated by orthostatic hypotension. She then had issues with increased a fib on loop recorder. She was set up for DCCV on 4/10 and successfully cardioverted. She has been on Eliquis  but has planned with cardiology to d/c after 5/10 and transition to ASA as long as she is stable.   She represented to the ED on 4/12 with SOB, dry cough. She had findings of possible multifocal pneumonia, although procalcitonin and WBC were normal. Strep pneumoniae and legionella were negative. She was treated with empiric azithromycin /ceftriaxone . She was discharged on augmentin  and azithromycin  to complete 5 day course. She was also treated for possible CHF exacerbation with diuretic therapy. She was weaned off supplemental oxygen and discharged home on room air 4/14.   Since her discharge, she has experienced persistent diarrhea, which she attributes to the antibiotics. The diarrhea has improved but is not fully resolved, occurring two to three times daily now. She reports associated hemorrhoids and increased gas. She has a history of constipation but is not currently using stool softeners. She denies any abdominal pain, N/V, bloody stools.   Regarding her respiratory symptoms, she initially experienced difficulty breathing and wheezing during her hospital stay, which has since improved. She can take deep breaths without trouble. Feels back to her baseline for the most part. She uses an incentive spirometer provided upon discharge. She does have an occasional dry cough and some postnasal drainage. She notes that the drainage aggravates the cough at night some. No headaches, sore throat,  sputum production, fevers, chills, hemoptysis, weight gain, leg swelling.   She is concerned about pulmonary nodules found on her CT scan. No hemoptysis or significant weight loss. Her appetite remains good. She does have a history of  right breast cancer s/p right sided mastectomy. Mammogram and US  in November were normal. No prior smoking history. Her husband does smoke but he smokes outside. Has had some secondhand exposure in the past.        No Known Allergies  Immunization History  Administered Date(s) Administered   Influenza, High Dose Seasonal PF 03/04/2015, 04/07/2016, 03/16/2017, 02/09/2018, 02/19/2019, 02/20/2020   Influenza-Unspecified 03/04/2021   PFIZER(Purple Top)SARS-COV-2 Vaccination 06/18/2019, 07/12/2019, 01/27/2020   Pneumococcal Conjugate-13 02/05/2014   Pneumococcal Polysaccharide-23 04/07/2016   Tdap 03/18/2008    Past Medical History:  Diagnosis Date   (HFpEF) heart failure with preserved ejection fraction (HCC)    a. 08/2019 Echo: EF 60-65%, no rwma, mild LVH, Gr2 DD, nl RV fxn. Nl pASP. Mildly dil LA. Mild to mod MR.   Anemia    Anxiety    Arthritis    BRCA gene mutation negative 10/2016   NEGATIVE: [facility]   Breast cancer (HCC) 10/16/2016   T1c,N0;, GRADE I/III, 1.6cm. ER/PR pos  HER2 not over expressed, Right Upper Outer   Celiac disease    Celiac syndrome    Cerebellar hemorrhage, acute (HCC) 07/17/2020   CHF (congestive heart failure) (HCC)    Depression    Dyspnea    WITH EXERTION   Fatty liver    GERD (gastroesophageal reflux disease)    OCC   Glaucoma    right eye   Headache    H/O MIGRAINES AS TEENAGER.   Heart murmur    a. 08/2019 Echo: mild to mod MR.   Hypertension    Hypothyroidism    Non-obstructive CAD (coronary artery disease)    a. 01/2017 MV: Hypertensive response. No ischemia/infarct. EF >65%; b. 09/2019 Cor CTA: Ca2+ = 9.29 (36th percentile). LAD calcified plaque (0-24%), otw nl. Multipel bilat pulm nodules up to 7mm.   Osteoporosis    PAF (paroxysmal atrial fibrillation) (HCC)    a. 02/2020 Zio: predominantly RSR, 65 (50-105), rare PACs/PVCs, 8 beats NSVT, multiple episodes of PAF lasting up to 1hr . Avg AF rate 130 (93-170). AF burden <1%. Triggered  events = RSR, PACs, and PAF; b. CHA2DS2VASc = 6.   Pneumonia    YEARS AGO   Pre-diabetes    Presence of Watchman left atrial appendage closure device 01/2021   Pulmonary nodules    a. 09/2019 Cor CTA: incidental finding of multiple bilat pulm nodules; b. 12/2019 High Res CT: stable, scattered solid pulm nodules.   Raynaud's disease    RLS (restless legs syndrome)    Stroke (HCC) 06/2020   mild balance issues   Wears hearing aid in both ears     Tobacco History: Social History   Tobacco Use  Smoking Status Never  Smokeless Tobacco Never   Counseling given: Not Answered   Outpatient Medications Prior to Visit  Medication Sig Dispense Refill   acetaminophen  (TYLENOL ) 325 MG tablet Take 2 tablets (650 mg total) by mouth every 6 (six) hours as needed for mild pain (pain score 1-3), fever or headache (or temp > 100.5). (Patient taking differently: Take 500-750 mg by mouth every 6 (six) hours as needed for mild pain (pain score 1-3), fever, headache or moderate pain (pain score 4-6) (or temp > 100.5).) 100 tablet 0   ALPRAZolam  (XANAX ) 0.25  MG tablet Take 0.25 mg by mouth daily as needed.     amiodarone  (PACERONE ) 200 MG tablet Take 0.5 tablets (100 mg total) by mouth daily. 45 tablet 3   atorvastatin  (LIPITOR) 40 MG tablet Take 1 tablet (40 mg total) by mouth daily. 90 tablet 3   brimonidine  (ALPHAGAN ) 0.2 % ophthalmic solution Place 1 drop into both eyes 2 (two) times daily.     Calcium  Carb-Cholecalciferol  (CALCIUM  600 + D PO) Take 1 tablet by mouth daily.     Cholecalciferol  (VITAMIN D3) 125 MCG (5000 UT) TABS Take 5,000 Units by mouth daily.     cyanocobalamin  (VITAMIN B12) 500 MCG tablet Take 1 tablet (500 mcg total) by mouth daily. 90 tablet 0   docusate sodium  (COLACE) 100 MG capsule Take 100 mg by mouth daily.     ferrous sulfate 325 (65 FE) MG tablet Take 325 mg by mouth daily with breakfast.     hydrALAZINE  (APRESOLINE ) 50 MG tablet Take 1 tablet (50 mg total) by mouth 3 (three)  times daily as needed (SBP >160). 90 tablet 0   latanoprost  (XALATAN ) 0.005 % ophthalmic solution Place 1 drop into both eyes at bedtime.  5   levothyroxine  (SYNTHROID ) 100 MCG tablet TAKE 1 TABLET BY MOUTH 30  MINUTES BEFORE BREAKFAST 90 tablet 3   losartan  (COZAAR ) 50 MG tablet Take 1.5 tablets (75 mg total) by mouth daily. 135 tablet 3   Multiple Vitamin (MULTIVITAMIN WITH MINERALS) TABS tablet Take 1 tablet by mouth daily. Centrum Silver     omeprazole  (PRILOSEC) 40 MG capsule TAKE 1 CAPSULE BY MOUTH DAILY 90 capsule 3   potassium chloride  (KLOR-CON ) 20 MEQ packet DISSOLVE 2 PACKETS IN 4 OZ WATER OR JUICE AND DRINK DAILY 200 each 3   sertraline  (ZOLOFT ) 100 MG tablet Take 100 mg by mouth daily.     simethicone  (MYLICON) 80 MG chewable tablet Chew 80 mg by mouth every 6 (six) hours as needed for flatulence.     metoprolol  tartrate (LOPRESSOR ) 25 MG tablet Take 0.5 tablets (12.5 mg total) by mouth as needed (Afib episodes with HR > 120bpm.). 30 tablet 3   No facility-administered medications prior to visit.     Review of Systems:   Constitutional: No weight loss or gain, night sweats, fevers, chills, or lassitude. +fatigue (baseline) HEENT: No headaches, difficulty swallowing, tooth/dental problems, or sore throat. No sneezing, itching, ear ache +nasal congestion, post nasal drip CV:  No chest pain, orthopnea, PND, swelling in lower extremities, anasarca, dizziness, palpitations, syncope Resp: +improved shortness of breath with exertion; dry cough. No excess mucus or change in color of mucus. No hemoptysis. No wheezing.  No chest wall deformity GI:  +diarrhea, bloating. No heartburn, indigestion, abdominal pain, nausea, vomiting, loss of appetite, bloody stools.  GU: No dysuria, change in color of urine, urgency or frequency.  No flank pain, no hematuria  Skin: No rash, lesions, ulcerations MSK:  No joint pain or swelling.  No decreased range of motion.  No back pain. Neuro: No dizziness  or lightheadedness.  Psych: No depression or anxiety. Mood stable.     Physical Exam:  BP 126/60 (BP Location: Left Arm, Patient Position: Sitting, Cuff Size: Normal)   Pulse 67   Temp 97.7 F (36.5 C) (Temporal)   Ht 5\' 3"  (1.6 m)   Wt 122 lb 3.2 oz (55.4 kg)   SpO2 97%   BMI 21.65 kg/m   GEN: Pleasant, interactive, well-kempt; elderly; in no acute distress HEENT:  Normocephalic and atraumatic. PERRLA. Sclera white. Nasal turbinates erythematous, moist and patent bilaterally. Clear rhinorrhea present. Oropharynx pink and moist, without exudate or edema. No lesions, ulcerations, or postnasal drip.  NECK:  Supple w/ fair ROM. No JVD present. Normal carotid impulses w/o bruits. Thyroid  symmetrical with no goiter or nodules palpated. Submandibular lymphadenopathy.   CV: RRR, no m/r/g, no peripheral edema. Pulses intact, +2 bilaterally. No cyanosis, pallor or clubbing. PULMONARY:  Unlabored, regular breathing. Clear bilaterally A&P w/o wheezes/rales/rhonchi. No accessory muscle use.  GI: BS present and normoactive. Soft, non-tender to palpation. No organomegaly or masses detected.  MSK: No erythema, warmth or tenderness. Cap refil <2 sec all extrem.  Neuro: A/Ox3. No focal deficits noted.   Skin: Warm, no lesions or rashe Psych: Normal affect and behavior. Judgement and thought content appropriate.     Lab Results:  CBC    Component Value Date/Time   WBC 4.6 09/10/2023 0315   RBC 3.36 (L) 09/10/2023 0315   HGB 10.3 (L) 09/10/2023 0315   HGB 11.1 08/30/2023 1505   HCT 31.6 (L) 09/10/2023 0315   HCT 35.1 08/30/2023 1505   PLT 210 09/10/2023 0315   PLT 224 08/30/2023 1505   MCV 94.0 09/10/2023 0315   MCV 96 08/30/2023 1505   MCH 30.7 09/10/2023 0315   MCHC 32.6 09/10/2023 0315   RDW 14.3 09/10/2023 0315   RDW 13.6 08/30/2023 1505   LYMPHSABS 1.6 04/02/2022 0057   LYMPHSABS 1.5 07/22/2020 1126   MONOABS 0.9 04/02/2022 0057   EOSABS 0.0 04/02/2022 0057   EOSABS 0.0  07/22/2020 1126   BASOSABS 0.0 04/02/2022 0057   BASOSABS 0.0 07/22/2020 1126    BMET    Component Value Date/Time   NA 137 09/10/2023 0315   NA 141 08/30/2023 1505   K 3.5 09/10/2023 0315   CL 102 09/10/2023 0315   CO2 25 09/10/2023 0315   GLUCOSE 96 09/10/2023 0315   BUN 18 09/10/2023 0315   BUN 14 08/30/2023 1505   CREATININE 0.72 09/10/2023 0315   CALCIUM  8.4 (L) 09/10/2023 0315   GFRNONAA >60 09/10/2023 0315   GFRAA 99 07/22/2020 1126    BNP    Component Value Date/Time   BNP 225.4 (H) 09/08/2023 1426     Imaging:  MR CARDIAC MORPHOLOGY W WO CONTRAST Result Date: 09/26/2023 CLINICAL DATA:  Evaluate LVH, evaluate scar, atrial flutter EXAM: CARDIAC MRI TECHNIQUE: The patient was scanned on a 1.5 Tesla Siemens magnet. A dedicated cardiac coil was used. Functional imaging was done using Fiesta sequences. 2,3, and 4 chamber views were done to assess for RWMA's. Modified Simpson's rule using a short axis stack was used to calculate an ejection fraction on a dedicated work [employer], [employer]. The patient received 9 cc of Gadavist . After 10 minutes inversion recovery sequences were used to assess for infiltration and scar tissue. Velocity flow mapping performed in the ascending aorta and main pulmonary artery. CONTRAST:  9 cc  of Gadavist  FINDINGS: 1. Normal left ventricular size, mild LV thickness, normal systolic function (LVEF = 70%). There are no regional wall motion abnormalities. There is no late gadolinium enhancement in the left ventricular myocardium. LVEDV: 105 ml LVESV: 31 SV: 74 ml CO: 4.6 L/min Myocardial mass: 90 g LV native T1 value 1055 ms (normal <1000 ms) LV ECV value 31% % (normal <30%) 2. Normal right ventricular size, thickness and systolic function (RVEF = 63%). There are no regional wall motion abnormalities. 3.  Mildly dilated left atrial size.  4. Normal size of the aortic root, ascending aorta and pulmonary artery. 5. Trivial mitral regurgitation. no  significant valvular abnormalities. 6.  Normal pericardium.  No pericardial effusion. IMPRESSION: 1.  Normal LV size and systolic function.  LVEF 70%. 2.  Mild left ventricular hypertrophy. 3.  No LGE or scar. 4.  Normal RV function. 5.  No evidence for infiltrative disease. Electronically Signed   By: Constancia Delton M.D.   On: 09/26/2023 14:06   MR CARDIAC VELOCITY FLOW MAP Result Date: 09/26/2023 CLINICAL DATA:  Evaluate LVH, evaluate scar, atrial flutter EXAM: CARDIAC MRI TECHNIQUE: The patient was scanned on a 1.5 Tesla Siemens magnet. A dedicated cardiac coil was used. Functional imaging was done using Fiesta sequences. 2,3, and 4 chamber views were done to assess for RWMA's. Modified Simpson's rule using a short axis stack was used to calculate an ejection fraction on a dedicated work [employer] officer, [employer]. The patient received 9 cc of Gadavist . After 10 minutes inversion recovery sequences were used to assess for infiltration and scar tissue. Velocity flow mapping performed in the ascending aorta and main pulmonary artery. CONTRAST:  9 cc  of Gadavist  FINDINGS: 1. Normal left ventricular size, mild LV thickness, normal systolic function (LVEF = 70%). There are no regional wall motion abnormalities. There is no late gadolinium enhancement in the left ventricular myocardium. LVEDV: 105 ml LVESV: 31 SV: 74 ml CO: 4.6 L/min Myocardial mass: 90 g LV native T1 value 1055 ms (normal <1000 ms) LV ECV value 31% % (normal <30%) 2. Normal right ventricular size, thickness and systolic function (RVEF = 63%). There are no regional wall motion abnormalities. 3.  Mildly dilated left atrial size. 4. Normal size of the aortic root, ascending aorta and pulmonary artery. 5. Trivial mitral regurgitation. no significant valvular abnormalities. 6.  Normal pericardium.  No pericardial effusion. IMPRESSION: 1.  Normal LV size and systolic function.  LVEF 70%. 2.  Mild left ventricular hypertrophy. 3.  No LGE or scar.  4.  Normal RV function. 5.  No evidence for infiltrative disease. Electronically Signed   By: Constancia Delton M.D.   On: 09/26/2023 14:06   MR CARDIAC VELOCITY FLOW MAP Result Date: 09/26/2023 CLINICAL DATA:  Evaluate LVH, evaluate scar, atrial flutter EXAM: CARDIAC MRI TECHNIQUE: The patient was scanned on a 1.5 Tesla Siemens magnet. A dedicated cardiac coil was used. Functional imaging was done using Fiesta sequences. 2,3, and 4 chamber views were done to assess for RWMA's. Modified Simpson's rule using a short axis stack was used to calculate an ejection fraction on a dedicated work [employer] officer, [employer]. The patient received 9 cc of Gadavist . After 10 minutes inversion recovery sequences were used to assess for infiltration and scar tissue. Velocity flow mapping performed in the ascending aorta and main pulmonary artery. CONTRAST:  9 cc  of Gadavist  FINDINGS: 1. Normal left ventricular size, mild LV thickness, normal systolic function (LVEF = 70%). There are no regional wall motion abnormalities. There is no late gadolinium enhancement in the left ventricular myocardium. LVEDV: 105 ml LVESV: 31 SV: 74 ml CO: 4.6 L/min Myocardial mass: 90 g LV native T1 value 1055 ms (normal <1000 ms) LV ECV value 31% % (normal <30%) 2. Normal right ventricular size, thickness and systolic function (RVEF = 63%). There are no regional wall motion abnormalities. 3.  Mildly dilated left atrial size. 4. Normal size of the aortic root, ascending aorta and pulmonary artery. 5. Trivial mitral regurgitation. no significant valvular abnormalities.  6.  Normal pericardium.  No pericardial effusion. IMPRESSION: 1.  Normal LV size and systolic function.  LVEF 70%. 2.  Mild left ventricular hypertrophy. 3.  No LGE or scar. 4.  Normal RV function. 5.  No evidence for infiltrative disease. Electronically Signed   By: Constancia Delton M.D.   On: 09/26/2023 14:06   ECHOCARDIOGRAM COMPLETE Result Date: 09/10/2023     ECHOCARDIOGRAM REPORT   Patient Name:   KERLINE MOREA Date of Exam: 09/09/2023 Medical Rec #:  [id number]      Height:       63.0 in Accession #:    [id number]     Weight:       127.0 lb Date of Birth:  10-02-1945     BSA:          1.594 m Patient Age:    77 years       BP:           163/66 mmHg Patient Gender: F              HR:           74 bpm. Exam Location:  [facility] Procedure: 2D Echo, Cardiac Doppler and Color Doppler (Both Spectral and Color            Flow Doppler were utilized during procedure). Indications:     CHF I50.31  History:         Patient has prior history of Echocardiogram examinations, most                  recent 02/21/2023.  Sonographer:     Clenton Czech RDCS, FASE Referring Phys:  [id number] Lanetta Pion Diagnosing Phys: Sammy Crisp MD IMPRESSIONS  1. Left ventricular ejection fraction, by estimation, is 65 to 70%. The left ventricle has normal function. The left ventricle has no regional wall motion abnormalities. There is mild left ventricular hypertrophy. Left ventricular diastolic parameters are indeterminate.  2. Right ventricular systolic function is normal. The right ventricular size is normal. There is moderately elevated pulmonary artery systolic pressure. The estimated right ventricular systolic pressure is 49.2 mmHg.  3. The mitral valve is degenerative. Mild to moderate mitral valve regurgitation. No evidence of mitral stenosis.  4. Tricuspid valve regurgitation is mild to moderate.  5. The aortic valve is tricuspid. There is mild thickening of the aortic valve. Aortic valve regurgitation is not visualized. Aortic valve sclerosis is present, with no evidence of aortic valve stenosis.  6. The inferior vena cava is normal in size with greater than 50% respiratory variability, suggesting right atrial pressure of 3 mmHg. FINDINGS  Left Ventricle: Left ventricular ejection fraction, by estimation, is 65 to 70%. The left ventricle has normal function. The left ventricle has no  regional wall motion abnormalities. The left ventricular internal cavity size was normal in size. There is  mild left ventricular hypertrophy. Left ventricular diastolic parameters are indeterminate. Right Ventricle: The right ventricular size is normal. No increase in right ventricular wall thickness. Right ventricular systolic function is normal. There is moderately elevated pulmonary artery systolic pressure. The tricuspid regurgitant velocity is 3.40 m/s, and with an assumed right atrial pressure of 3 mmHg, the estimated right ventricular systolic pressure is 49.2 mmHg. Left Atrium: Left atrial size was normal in size. Right Atrium: Right atrial size was normal in size. Pericardium: There is no evidence of pericardial effusion. Mitral Valve: The mitral valve is degenerative in appearance. There is mild thickening of the  mitral valve leaflet(s). Mild to moderate mitral valve regurgitation. No evidence of mitral valve stenosis. Tricuspid Valve: The tricuspid valve is not well visualized. Tricuspid valve regurgitation is mild to moderate. Aortic Valve: The aortic valve is tricuspid. There is mild thickening of the aortic valve. Aortic valve regurgitation is not visualized. Aortic valve sclerosis is present, with no evidence of aortic valve stenosis. Aortic valve peak gradient measures 13.7 mmHg. Pulmonic Valve: The pulmonic valve was normal in structure. Pulmonic valve regurgitation is mild. No evidence of pulmonic stenosis. Aorta: The aortic root and ascending aorta are structurally normal, with no evidence of dilitation. Pulmonary Artery: The pulmonary artery is of normal size. Venous: The inferior vena cava is normal in size with greater than 50% respiratory variability, suggesting right atrial pressure of 3 mmHg. IAS/Shunts: The interatrial septum was not well visualized.  LEFT VENTRICLE PLAX 2D LVIDd:         4.70 cm   Diastology LVIDs:         2.90 cm   LV e' medial:    10.20 cm/s LV PW:         1.20 cm   LV  E/e' medial:  9.9 LV IVS:        1.30 cm   LV e' lateral:   10.30 cm/s LVOT diam:     1.80 cm   LV E/e' lateral: 9.8 LV SV:         68 LV SV Index:   42 LVOT Area:     2.54 cm  RIGHT VENTRICLE RV Basal diam:  3.20 cm RV S prime:     13.90 cm/s TAPSE (M-mode): 2.2 cm LEFT ATRIUM             Index        RIGHT ATRIUM           Index LA diam:        3.90 cm 2.45 cm/m   RA Area:     15.47 cm LA Vol (A2C):   27.6 ml 17.31 ml/m  RA Volume:   39.32 ml  24.66 ml/m LA Vol (A4C):   30.7 ml 19.26 ml/m LA Biplane Vol: 30.4 ml 19.07 ml/m  AORTIC VALVE                 PULMONIC VALVE AV Area (Vmax): 2.02 cm     PV Vmax:          1.27 m/s AV Vmax:        185.00 cm/s  PV Peak grad:     6.5 mmHg AV Peak Grad:   13.7 mmHg    PR End Diast Vel: 4.58 msec LVOT Vmax:      147.00 cm/s  RVOT Peak grad:   6 mmHg LVOT Vmean:     97.600 cm/s LVOT VTI:       0.266 m  AORTA Ao Root diam: 3.00 cm Ao Asc diam:  2.80 cm MITRAL VALVE                TRICUSPID VALVE MV Area (PHT): 2.96 cm     TR Peak grad:   46.2 mmHg MV Decel Time: 256 msec     TR Vmax:        340.00 cm/s MV E velocity: 101.00 cm/s MV A velocity: 32.80 cm/s   SHUNTS MV E/A ratio:  3.08         Systemic VTI:  0.27 m  Systemic Diam: 1.80 cm Sammy Crisp MD Electronically signed by Sammy Crisp MD Signature Date/Time: 09/10/2023/11:47:18 AM    Final    CT HEAD WO CONTRAST ( ) Result Date: 09/09/2023 CLINICAL DATA:  Headache EXAM: CT HEAD WITHOUT CONTRAST TECHNIQUE: Contiguous axial images were obtained from the base of the skull through the vertex without intravenous contrast. RADIATION DOSE REDUCTION: This exam was performed according to the departmental dose-optimization program which includes automated exposure control, adjustment of the mA and/or kV according to patient size and/or use of iterative reconstruction technique. COMPARISON:  06/06/2023 FINDINGS: Brain: No evidence of acute infarction, hemorrhage, hydrocephalus, extra-axial  collection or mass lesion/mass effect. Mild cerebral volume loss and white matter low-density for age Vascular: No hyperdense vessel or unexpected calcification. Skull: Normal. Negative for fracture or focal lesion. Sinuses/Orbits: No acute finding.  No covered sinusitis IMPRESSION: Aging brain without acute finding or specific cause for headache. Electronically Signed   By: Ronnette Coke M.D.   On: 09/09/2023 09:32   US  Venous Img Lower Unilateral Left Result Date: 09/08/2023 CLINICAL DATA:  Swelling EXAM: LEFT LOWER EXTREMITY VENOUS DOPPLER ULTRASOUND TECHNIQUE: Gray-scale sonography with compression, as well as color and duplex ultrasound, were performed to evaluate the deep venous system(s) from the level of the common femoral vein through the popliteal and proximal calf veins. COMPARISON:  Contralateral 08/18/2019 FINDINGS: VENOUS Normal compressibility of the common femoral, superficial femoral, and popliteal veins, as well as the visualized calf veins. Visualized portions of profunda femoral vein and great saphenous vein unremarkable. No filling defects to suggest DVT on grayscale or color Doppler imaging. Doppler waveforms show normal direction of venous flow, normal respiratory plasticity and response to augmentation. Limited views of the contralateral common femoral vein are unremarkable. OTHER None. Limitations: none IMPRESSION: Negative. Electronically Signed   By: Nicoletta Barrier M.D.   On: 09/08/2023 17:05   CT Angio Chest PE W and/or Wo Contrast Result Date: 09/08/2023 CLINICAL DATA:  Shortness of breath and fatigue. Leg swelling. Evaluate for PE. EXAM: CT ANGIOGRAPHY CHEST WITH CONTRAST TECHNIQUE: Multidetector CT imaging of the chest was performed using the standard protocol during bolus administration of intravenous contrast. Multiplanar CT image reconstructions and MIPs were obtained to evaluate the vascular anatomy. RADIATION DOSE REDUCTION: This exam was performed according to the  departmental dose-optimization program which includes automated exposure control, adjustment of the mA and/or kV according to patient size and/or use of iterative reconstruction technique. CONTRAST:  75mL OMNIPAQUE  IOHEXOL  350 MG/ML SOLN COMPARISON:  Chest CT 04/21/2022 FINDINGS: Cardiovascular: There is adequate opacification of the pulmonary arteries. No pulmonary embolism identified. The heart is mildly enlarged. Aorta is nondilated. There are atherosclerotic calcifications of the aorta. Left atrial device again noted. There is no pericardial effusion. Mediastinum/Nodes: No enlarged mediastinal, hilar, or axillary lymph nodes. Thyroid  gland, trachea, and esophagus demonstrate no significant findings. Lungs/Pleura: There are small bilateral pleural effusions, right greater than left. There are patchy and confluent air areas of ground-glass throughout both lungs. There is an air trapping in the lung bases similar to prior. There also minimal patchy airspace opacities in the bilateral upper lobes. There is a 5 mm left lower lobe pulmonary nodule image 6/106 (previously measuring 4 mm). There is a 5 mm nodule in the left lower lobe image 6/100 (previously measuring 3 mm). There are new ill-defined slightly nodular densities scattered throughout the right upper lobe measuring up to 8 mm. Upper Abdomen: No acute abnormality. Musculoskeletal: No chest wall abnormality. No acute or significant osseous  findings. Review of the MIP images confirms the above findings. IMPRESSION: 1. No evidence for pulmonary embolism. 2. Small bilateral pleural effusions, right greater than left. 3. Patchy and confluent areas of ground-glass throughout both lungs, new from prior, worrisome for multifocal pneumonia. 4. New ill-defined slightly nodular densities scattered throughout the right upper lobe measuring up to 8 mm, likely infectious/inflammatory. 5. Stable air trapping in the lung bases which can be seen with small airways disease.  6. Nodular densities in the left lower lobe measuring up to 5 mm have mildly increased in size compared to 2023 and are indeterminate. Continued follow-up recommended. 7. Mild cardiomegaly. 8. Aortic atherosclerosis. Electronically Signed   By: Tyron Gallon M.D.   On: 09/08/2023 16:12   DG Chest 2 View Result Date: 09/08/2023 CLINICAL DATA:  Shortness of breath EXAM: CHEST - 2 VIEW COMPARISON:  X-ray 07/22/2023 and older FINDINGS: Loop recorder. Enlarged cardiopericardial silhouette with calcified aorta. Atrial appendage occlusion device. Vascular congestion. No pneumothorax, effusion or edema. No consolidation. Degenerative changes of the spine. IMPRESSION: Stable mild enlargement of the cardiac silhouette with vascular congestion. Loop recorder. Atrial appendage occlusion device. Electronically Signed   By: Adrianna Horde M.D.   On: 09/08/2023 15:13    Administration History     None          Latest Ref Rng & Units 12/04/2019    2:39 PM  PFT Results  FVC-Pre L 2.02   FVC-Predicted Pre % 73   FVC-Post L 2.04   FVC-Predicted Post % 74   Pre FEV1/FVC % % 81   Post FEV1/FCV % % 84   FEV1-Pre L 1.63   FEV1-Predicted Pre % 79   FEV1-Post L 1.70   DLCO uncorrected ml/min/mmHg 15.22   DLCO UNC% % 82   DLCO corrected ml/min/mmHg 16.13   DLCO COR %Predicted % 87   DLVA Predicted % 105   TLC L 3.78   TLC % Predicted % 77   RV % Predicted % 76     No results found for: "NITRICOXIDE"      Assessment & Plan:      Multifocal pneumonia? Recent hospitalization from 4/12-4/14. Treated empirically for possible multifocal pneumonia. Unclear if this was truly infectious or volume overload. She had normal WBC and procalcitonin. She improved with diuretic therapy and 5 days course of abx. She did have some increased nodular densities, possibly infectious/inflammatory. Clinically improved. Residual cough seems to be more upper airway in nature due to postnasal drainage. Will plan to repeat CT  chest in 8 weeks to follow up on nodules and ensure resolution/stability.  - Monitor symptoms - Repeat CT chest in 8 weeks   Pulmonary nodules Pulmonary nodules identified on CT scan, with a new nodule in the right upper lobe and a slightly enlarged existing nodules in the left lower lobe. Likely infectious/inflammatory in nature but will require dedicated follow up.  - Order repeat CT scan in 8 weeks to assess changes in pulmonary nodules.  Pleural effusion Small bilateral pleural effusions observed on prior imaging, consistent with CHF exacerbation or possible PH. She had normal EF on echo. Moderately elevated PASP, likely Group II given cardiac disease. Prior PFTs normal. No obstructive lung disease or ILD. Euvolemic today and clinically improved. - Order chest x-ray to assess improvement in pleural effusion and volume status - Monitor weights/swelling  - Could consider RHC if DOE persists or she has recurrent symptoms   Atrial fibrillation Atrial fibrillation with recent cardioversion. S/p Watchman.  Eliquis  prescribed for 30 days post-cardioversion with plan to d/c 5/10 and transition to ASA. Appears to be in SR today on auscultation. Follow up with cardiology as scheduled. - Monitor heart rate and rhythm for irregularities. - Contact cardiologist if experiencing increased dyspnea or palpitations.  Diarrhea Persistent diarrhea following antibiotic treatment for potential pneumonia. Some improvement. Differential includes side effect from abx vs possible C. difficile infection. - Contact primary care physician for further evaluation and potential stool testing for C. difficile. - Consider probiotic supplementation to restore gut flora. - Maintain hydration and consume stool-bulking foods.  History of breast cancer Right-sided mastectomy approximately 6-7 years ago with lymph node involvement. Recent mammogram and ultrasound in November were negative for recurrence. - Continue routine  surveillance for breast cancer recurrence.  - Follow up on lung nodules to ensure resolution/stability   Pulmonary HTN See above   Acute respiratory failure Resolved. Goal >88-90%. No ongoing O2 requirement  DOE Baseline. Prior PFT overall normal. No evidence of obstructive lung disease or ILD. May consider repeat pending symptoms.   Allergic rhinitis S/s of allergic rhinitis with postnasal drainage and associated dry cough, worse at night. Advised to add on intranasal steroid and reassess response.  - Add on flonase  2 sprays each nostril daily  - Consider daily antihistamine  - Continue PPI for GERD management as GERD can contribute to cough as well - Monitor cough and notify of any worsening        Advised if symptoms do not improve or worsen, to please contact office for sooner follow up or seek emergency care.   I spent 45 minutes of dedicated to the care of this patient on the date of this encounter to include pre-visit review of records, face-to-face time with the patient discussing conditions above, post visit ordering of testing, clinical documentation with the electronic health record, making appropriate referrals as documented, and communicating necessary findings to members of the patients care team.  Roetta Clarke, NP 09/27/2023  Pt aware and understands NP's role.

## 2023-10-01 ENCOUNTER — Ambulatory Visit (INDEPENDENT_AMBULATORY_CARE_PROVIDER_SITE_OTHER): Payer: Medicare Other

## 2023-10-01 DIAGNOSIS — I4891 Unspecified atrial fibrillation: Secondary | ICD-10-CM | POA: Diagnosis not present

## 2023-10-01 LAB — CUP PACEART REMOTE DEVICE CHECK
Date Time Interrogation Session: 20250504233022
Implantable Pulse Generator Implant Date: 20240522

## 2023-10-03 ENCOUNTER — Encounter: Payer: Self-pay | Admitting: Cardiology

## 2023-10-11 NOTE — Progress Notes (Signed)
 [facility] Summary Report / Loop Recorder

## 2023-10-11 NOTE — Addendum Note (Signed)
 Addended by: Edra Govern D on: 10/11/2023 10:17 AM   Modules accepted: Orders

## 2023-11-01 ENCOUNTER — Ambulatory Visit (INDEPENDENT_AMBULATORY_CARE_PROVIDER_SITE_OTHER): Payer: Self-pay

## 2023-11-01 DIAGNOSIS — I495 Sick sinus syndrome: Secondary | ICD-10-CM | POA: Diagnosis not present

## 2023-11-01 LAB — CUP PACEART REMOTE DEVICE CHECK
Date Time Interrogation Session: 20250604232840
Implantable Pulse Generator Implant Date: 20240522

## 2023-11-03 ENCOUNTER — Ambulatory Visit: Payer: Self-pay | Admitting: Cardiology

## 2023-11-19 NOTE — Progress Notes (Signed)
 [facility] Summary Report / Loop Recorder

## 2023-11-26 ENCOUNTER — Inpatient Hospital Stay
Admission: EM | Admit: 2023-11-26 | Discharge: 2023-12-12 | DRG: 956 | Disposition: A | Discharge status: Home Health | Payer: Medicare (Managed Care) | Attending: Internal Medicine | Admitting: Internal Medicine

## 2023-11-26 ENCOUNTER — Emergency Department: Payer: Medicare (Managed Care)

## 2023-11-26 ENCOUNTER — Other Ambulatory Visit: Payer: Self-pay

## 2023-11-26 DIAGNOSIS — Z90710 Acquired absence of both cervix and uterus: Secondary | ICD-10-CM

## 2023-11-26 DIAGNOSIS — I480 Paroxysmal atrial fibrillation: Secondary | ICD-10-CM | POA: Diagnosis present

## 2023-11-26 DIAGNOSIS — I110 Hypertensive heart disease with heart failure: Secondary | ICD-10-CM | POA: Diagnosis present

## 2023-11-26 DIAGNOSIS — S72001A Fracture of unspecified part of neck of right femur, initial encounter for closed fracture: Principal | ICD-10-CM | POA: Diagnosis present

## 2023-11-26 DIAGNOSIS — Z823 Family history of stroke: Secondary | ICD-10-CM | POA: Diagnosis not present

## 2023-11-26 DIAGNOSIS — S32591S Other specified fracture of right pubis, sequela: Secondary | ICD-10-CM | POA: Diagnosis not present

## 2023-11-26 DIAGNOSIS — J302 Other seasonal allergic rhinitis: Secondary | ICD-10-CM

## 2023-11-26 DIAGNOSIS — I2510 Atherosclerotic heart disease of native coronary artery without angina pectoris: Secondary | ICD-10-CM | POA: Diagnosis present

## 2023-11-26 DIAGNOSIS — Z96642 Presence of left artificial hip joint: Secondary | ICD-10-CM | POA: Diagnosis present

## 2023-11-26 DIAGNOSIS — F32A Depression, unspecified: Secondary | ICD-10-CM | POA: Diagnosis present

## 2023-11-26 DIAGNOSIS — S72001D Fracture of unspecified part of neck of right femur, subsequent encounter for closed fracture with routine healing: Secondary | ICD-10-CM | POA: Diagnosis not present

## 2023-11-26 DIAGNOSIS — S32591A Other specified fracture of right pubis, initial encounter for closed fracture: Secondary | ICD-10-CM | POA: Diagnosis present

## 2023-11-26 DIAGNOSIS — F411 Generalized anxiety disorder: Secondary | ICD-10-CM | POA: Diagnosis present

## 2023-11-26 DIAGNOSIS — I7300 Raynaud's syndrome without gangrene: Secondary | ICD-10-CM | POA: Diagnosis present

## 2023-11-26 DIAGNOSIS — K219 Gastro-esophageal reflux disease without esophagitis: Secondary | ICD-10-CM | POA: Diagnosis present

## 2023-11-26 DIAGNOSIS — Z9289 Personal history of other medical treatment: Secondary | ICD-10-CM

## 2023-11-26 DIAGNOSIS — Z79899 Other long term (current) drug therapy: Secondary | ICD-10-CM | POA: Diagnosis not present

## 2023-11-26 DIAGNOSIS — Z79890 Hormone replacement therapy (postmenopausal): Secondary | ICD-10-CM

## 2023-11-26 DIAGNOSIS — M25531 Pain in right wrist: Secondary | ICD-10-CM | POA: Diagnosis present

## 2023-11-26 DIAGNOSIS — I5032 Chronic diastolic (congestive) heart failure: Secondary | ICD-10-CM | POA: Diagnosis present

## 2023-11-26 DIAGNOSIS — E039 Hypothyroidism, unspecified: Secondary | ICD-10-CM | POA: Diagnosis present

## 2023-11-26 DIAGNOSIS — W010XXA Fall on same level from slipping, tripping and stumbling without subsequent striking against object, initial encounter: Secondary | ICD-10-CM | POA: Diagnosis present

## 2023-11-26 DIAGNOSIS — Z8249 Family history of ischemic heart disease and other diseases of the circulatory system: Secondary | ICD-10-CM | POA: Diagnosis not present

## 2023-11-26 DIAGNOSIS — E876 Hypokalemia: Secondary | ICD-10-CM | POA: Diagnosis present

## 2023-11-26 DIAGNOSIS — C50911 Malignant neoplasm of unspecified site of right female breast: Secondary | ICD-10-CM | POA: Diagnosis present

## 2023-11-26 DIAGNOSIS — Z8673 Personal history of transient ischemic attack (TIA), and cerebral infarction without residual deficits: Secondary | ICD-10-CM

## 2023-11-26 DIAGNOSIS — K760 Fatty (change of) liver, not elsewhere classified: Secondary | ICD-10-CM | POA: Diagnosis present

## 2023-11-26 DIAGNOSIS — Z95818 Presence of other cardiac implants and grafts: Secondary | ICD-10-CM | POA: Diagnosis not present

## 2023-11-26 DIAGNOSIS — H409 Unspecified glaucoma: Secondary | ICD-10-CM | POA: Diagnosis present

## 2023-11-26 DIAGNOSIS — S72009A Fracture of unspecified part of neck of unspecified femur, initial encounter for closed fracture: Secondary | ICD-10-CM | POA: Diagnosis present

## 2023-11-26 DIAGNOSIS — Z853 Personal history of malignant neoplasm of breast: Secondary | ICD-10-CM | POA: Diagnosis not present

## 2023-11-26 DIAGNOSIS — I495 Sick sinus syndrome: Secondary | ICD-10-CM | POA: Diagnosis not present

## 2023-11-26 DIAGNOSIS — I10 Essential (primary) hypertension: Secondary | ICD-10-CM | POA: Diagnosis not present

## 2023-11-26 DIAGNOSIS — W19XXXA Unspecified fall, initial encounter: Principal | ICD-10-CM

## 2023-11-26 DIAGNOSIS — S32599A Other specified fracture of unspecified pubis, initial encounter for closed fracture: Secondary | ICD-10-CM

## 2023-11-26 LAB — CBC WITH DIFFERENTIAL/PLATELET
Abs Immature Granulocytes: 0.07 10*3/uL (ref 0.00–0.07)
Basophils Absolute: 0 10*3/uL (ref 0.0–0.1)
Basophils Relative: 0 %
Eosinophils Absolute: 0 10*3/uL (ref 0.0–0.5)
Eosinophils Relative: 0 %
HCT: 39.6 % (ref 36.0–46.0)
Hemoglobin: 12.5 g/dL (ref 12.0–15.0)
Immature Granulocytes: 1 %
Lymphocytes Relative: 4 %
Lymphs Abs: 0.5 10*3/uL — ABNORMAL LOW (ref 0.7–4.0)
MCH: 29.3 pg (ref 26.0–34.0)
MCHC: 31.6 g/dL (ref 30.0–36.0)
MCV: 92.7 fL (ref 80.0–100.0)
Monocytes Absolute: 0.5 10*3/uL (ref 0.1–1.0)
Monocytes Relative: 5 %
Neutro Abs: 10.4 10*3/uL — ABNORMAL HIGH (ref 1.7–7.7)
Neutrophils Relative %: 90 %
Platelets: 258 10*3/uL (ref 150–400)
RBC: 4.27 MIL/uL (ref 3.87–5.11)
RDW: 13 % (ref 11.5–15.5)
WBC: 11.5 10*3/uL — ABNORMAL HIGH (ref 4.0–10.5)
nRBC: 0 % (ref 0.0–0.2)

## 2023-11-26 LAB — BASIC METABOLIC PANEL WITH GFR
Anion gap: 12 (ref 5–15)
BUN: 18 mg/dL (ref 8–23)
CO2: 23 mmol/L (ref 22–32)
Calcium: 9 mg/dL (ref 8.9–10.3)
Chloride: 105 mmol/L (ref 98–111)
Creatinine, Ser: 0.58 mg/dL (ref 0.44–1.00)
GFR, Estimated: >60 mL/min (ref >60)
Glucose, Bld: 135 mg/dL — ABNORMAL HIGH (ref 70–99)
Potassium: 3 mmol/L — ABNORMAL LOW (ref 3.5–5.1)
Sodium: 140 mmol/L (ref 135–145)

## 2023-11-26 LAB — MAGNESIUM: Magnesium: 2 mg/dL (ref 1.7–2.4)

## 2023-11-26 LAB — SURGICAL PCR SCREEN
MRSA, PCR: NEGATIVE
Staphylococcus aureus: NEGATIVE

## 2023-11-26 MED ORDER — ATORVASTATIN CALCIUM 20 MG PO TABS
40.0000 mg | ORAL_TABLET | Freq: Every day | ORAL | Status: DC
  Administered 2023-11-26 – 2023-12-12 (×17): 40 mg via ORAL
  Filled 2023-11-26 (×17): qty 2

## 2023-11-26 MED ORDER — SENNA 8.6 MG PO TABS
1.0000 | ORAL_TABLET | Freq: Every day | ORAL | Status: DC
  Administered 2023-11-26 – 2023-11-27 (×2): 8.6 mg via ORAL
  Filled 2023-11-26 (×2): qty 1

## 2023-11-26 MED ORDER — POTASSIUM CHLORIDE 20 MEQ PO PACK
40.0000 meq | PACK | Freq: Once | ORAL | Status: AC
  Administered 2023-11-26: 40 meq via ORAL
  Filled 2023-11-26: qty 2

## 2023-11-26 MED ORDER — LEVOTHYROXINE SODIUM 100 MCG PO TABS
100.0000 ug | ORAL_TABLET | Freq: Every day | ORAL | Status: DC
  Administered 2023-11-27 – 2023-12-12 (×16): 100 ug via ORAL
  Filled 2023-11-26: qty 1
  Filled 2023-11-26 (×3): qty 2
  Filled 2023-11-26: qty 1
  Filled 2023-11-26: qty 2
  Filled 2023-11-26 (×4): qty 1
  Filled 2023-11-26 (×2): qty 2
  Filled 2023-11-26 (×3): qty 1
  Filled 2023-11-26: qty 2
  Filled 2023-11-26: qty 1
  Filled 2023-11-26 (×3): qty 2
  Filled 2023-11-26 (×4): qty 1
  Filled 2023-11-26: qty 2
  Filled 2023-11-26: qty 1
  Filled 2023-11-26 (×4): qty 2
  Filled 2023-11-26: qty 1
  Filled 2023-11-26: qty 2

## 2023-11-26 MED ORDER — BRIMONIDINE TARTRATE 0.2 % OP SOLN
1.0000 [drp] | Freq: Two times a day (BID) | OPHTHALMIC | Status: DC
  Administered 2023-11-26 – 2023-12-12 (×31): 1 [drp] via OPHTHALMIC
  Filled 2023-11-26: qty 5

## 2023-11-26 MED ORDER — MORPHINE SULFATE (PF) 2 MG/ML IV SOLN
2.0000 mg | Freq: Once | INTRAVENOUS | Status: AC
  Administered 2023-11-26: 2 mg via INTRAVENOUS
  Filled 2023-11-26: qty 1

## 2023-11-26 MED ORDER — METOPROLOL TARTRATE 25 MG PO TABS: 12.5000 mg | ORAL_TABLET | ORAL | Status: DC | PRN

## 2023-11-26 MED ORDER — SERTRALINE HCL 50 MG PO TABS
100.0000 mg | ORAL_TABLET | Freq: Every day | ORAL | Status: DC
  Administered 2023-11-26 – 2023-12-12 (×17): 100 mg via ORAL
  Filled 2023-11-26 (×17): qty 2

## 2023-11-26 MED ORDER — MORPHINE SULFATE (PF) 2 MG/ML IV SOLN
2.0000 mg | INTRAVENOUS | Status: DC | PRN
  Administered 2023-11-26 – 2023-11-27 (×5): 2 mg via INTRAVENOUS
  Filled 2023-11-26 (×6): qty 1

## 2023-11-26 MED ORDER — POTASSIUM CHLORIDE 20 MEQ PO PACK
40.0000 meq | PACK | Freq: Every day | ORAL | Status: DC
  Administered 2023-11-27 – 2023-12-12 (×16): 40 meq via ORAL
  Filled 2023-11-26 (×17): qty 2

## 2023-11-26 MED ORDER — AMIODARONE HCL 200 MG PO TABS
100.0000 mg | ORAL_TABLET | Freq: Every day | ORAL | Status: DC
  Administered 2023-11-26 – 2023-12-12 (×17): 100 mg via ORAL
  Filled 2023-11-26 (×17): qty 1

## 2023-11-26 MED ORDER — OXYCODONE HCL 5 MG PO TABS
5.0000 mg | ORAL_TABLET | Freq: Four times a day (QID) | ORAL | Status: DC | PRN
  Administered 2023-11-26 – 2023-11-27 (×3): 5 mg via ORAL
  Filled 2023-11-26 (×3): qty 1

## 2023-11-26 MED ORDER — LATANOPROST 0.005 % OP SOLN
1.0000 [drp] | Freq: Every day | OPHTHALMIC | Status: DC
  Administered 2023-11-26 – 2023-12-11 (×16): 1 [drp] via OPHTHALMIC
  Filled 2023-11-26: qty 2.5

## 2023-11-26 MED ORDER — POTASSIUM CHLORIDE CRYS ER 20 MEQ PO TBCR
40.0000 meq | EXTENDED_RELEASE_TABLET | Freq: Once | ORAL | Status: DC
  Filled 2023-11-26: qty 2

## 2023-11-26 MED ORDER — ALPRAZOLAM 0.25 MG PO TABS
0.2500 mg | ORAL_TABLET | Freq: Every day | ORAL | Status: DC | PRN
  Administered 2023-11-26: 0.25 mg via ORAL
  Filled 2023-11-26: qty 1

## 2023-11-26 MED ORDER — PANTOPRAZOLE SODIUM 40 MG PO TBEC
40.0000 mg | DELAYED_RELEASE_TABLET | Freq: Every day | ORAL | Status: DC
  Administered 2023-11-26 – 2023-12-12 (×17): 40 mg via ORAL
  Filled 2023-11-26 (×17): qty 1

## 2023-11-26 MED ORDER — CEFAZOLIN SODIUM-DEXTROSE 2-4 GM/100ML-% IV SOLN
2.0000 g | Freq: Once | INTRAVENOUS | Status: AC
  Administered 2023-11-27: 2 g via INTRAVENOUS

## 2023-11-26 MED ORDER — HYDROMORPHONE HCL 1 MG/ML IJ SOLN
0.5000 mg | Freq: Once | INTRAMUSCULAR | Status: AC
  Administered 2023-11-26: 0.5 mg via INTRAVENOUS
  Filled 2023-11-26: qty 0.5

## 2023-11-26 MED ORDER — ACETAMINOPHEN 500 MG PO TABS
1000.0000 mg | ORAL_TABLET | Freq: Once | ORAL | Status: AC
  Administered 2023-11-26: 1000 mg via ORAL
  Filled 2023-11-26: qty 2

## 2023-11-26 MED ORDER — LOSARTAN POTASSIUM 50 MG PO TABS
75.0000 mg | ORAL_TABLET | Freq: Every day | ORAL | Status: DC
  Administered 2023-11-26 – 2023-12-12 (×17): 75 mg via ORAL
  Filled 2023-11-26 (×17): qty 1

## 2023-11-26 MED ORDER — POLYETHYLENE GLYCOL 3350 17 G PO PACK
17.0000 g | PACK | Freq: Every day | ORAL | Status: DC
  Administered 2023-11-26 – 2023-12-06 (×7): 17 g via ORAL
  Filled 2023-11-26 (×12): qty 1

## 2023-11-26 MED ORDER — HYDRALAZINE HCL 50 MG PO TABS
50.0000 mg | ORAL_TABLET | Freq: Three times a day (TID) | ORAL | Status: DC | PRN
Start: 2023-11-26 — End: 2023-12-12

## 2023-11-26 NOTE — ED Provider Notes (Signed)
-----------------------------------------   7:09 AM on 11/26/2023 -----------------------------------------  Blood pressure (!) 175/79, pulse 87, temperature 98.1 F (36.7 C), temperature source Oral, resp. rate 16, SpO2 99%.  Assuming care from Dr. Cyrena.  In short, Sandra [name] is a 78 y.o. female with a chief complaint of Fall .  Refer to the original H&P for additional details.  The current plan of care is to follow-up labs and imaging for fall with hip pain.  ----------------------------------------- 7:54 AM on 11/26/2023 ----------------------------------------- X-ray imaging shows right femoral neck fracture as well as fractures of the superior and inferior pubic rami on that side.  Patient remains neurovascularly intact distally, will give dose of IV Dilaudid for ongoing pain.  CT head and cervical spine are negative for acute process, additional x-rays negative for traumatic injury.  Labs with mild hypokalemia but otherwise no significant anemia, leukocytosis, or AKI.  Case discussed with Dr. Tobie of orthopedics and hospitalist for admission.  ED ECG REPORT I, Carlin Palin, the attending physician, personally viewed and interpreted this ECG.   Date: 11/26/2023  EKG Time: 7:47  Rate: 71  Rhythm: normal sinus rhythm  Axis: Normal  Intervals:Prolonged QT  ST&T Change: LVH        Palin Carlin, MD 11/26/23 [phone]

## 2023-11-26 NOTE — ED Triage Notes (Signed)
 Patient suffered a mechanical fall last night around 2200.  She says that something was on the floor that shouldn't have been there.  She denies hitting her head, and denies LOC.  She was able to get back up with assistance, but woke up this morning with 10/10 pain in her right hip.  She has previously fractured the left hip.

## 2023-11-26 NOTE — Progress Notes (Signed)
Full consult note and discussion with patient to follow tomorrow.  Called by ED staff. Imaging reviewed.  - Plan for surgery tomorrow, likely afternoon.  - NPO after midnight - Hold anticoagulation - Admit to Hospitalist team.  

## 2023-11-26 NOTE — H&P (Signed)
 History and Physical    KUSHI KUN FMW:[id number] DOB: 04-25-1946 DOA: 11/26/2023  PCP: Sampson Ethridge LABOR, MD  Patient coming from: home   Chief Complaint: fall  HPI: Sandra [name] is a 78 y.o. female with medical history significant for left hip fracture s/p repair in march of this year, presenting with trip and fall at home with right hip pain. No chest pain recently or today. No dyspnea. Has some right wrist pain as well. Able to move right leg and feel her foot.    Review of Systems: As per HPI otherwise 10 point review of systems negative.    Past Medical History:  Diagnosis Date   (HFpEF) heart failure with preserved ejection fraction (HCC)    a. 08/2019 Echo: EF 60-65%, no rwma, mild LVH, Gr2 DD, nl RV fxn. Nl pASP. Mildly dil LA. Mild to mod MR.   Anemia    Anxiety    Arthritis    BRCA gene mutation negative 10/2016   NEGATIVE: [employer]   Breast cancer (HCC) 10/16/2016   T1c,N0;, GRADE I/III, 1.6cm. ER/PR pos  HER2 not over expressed, Right Upper Outer   Celiac disease    Celiac syndrome    Cerebellar hemorrhage, acute (HCC) 07/17/2020   CHF (congestive heart failure) (HCC)    Depression    Dyspnea    WITH EXERTION   Fatty liver    GERD (gastroesophageal reflux disease)    OCC   Glaucoma    right eye   Headache    H/O MIGRAINES AS TEENAGER.   Heart murmur    a. 08/2019 Echo: mild to mod MR.   Hypertension    Hypothyroidism    Non-obstructive CAD (coronary artery disease)    a. 01/2017 MV: Hypertensive response. No ischemia/infarct. EF >65%; b. 09/2019 Cor CTA: Ca2+ = 9.29 (36th percentile). LAD calcified plaque (0-24%), otw nl. Multipel bilat pulm nodules up to 7mm.   Osteoporosis    PAF (paroxysmal atrial fibrillation) (HCC)    a. 02/2020 Zio: predominantly RSR, 65 (50-105), rare PACs/PVCs, 8 beats NSVT, multiple episodes of PAF lasting up to 1hr . Avg AF rate 130 (93-170). AF burden <1%. Triggered events = RSR, PACs, and PAF; b. CHA2DS2VASc = 6.    Pneumonia    YEARS AGO   Pre-diabetes    Presence of Watchman left atrial appendage closure device 01/2021   Pulmonary nodules    a. 09/2019 Cor CTA: incidental finding of multiple bilat pulm nodules; b. 12/2019 High Res CT: stable, scattered solid pulm nodules.   Raynaud's disease    RLS (restless legs syndrome)    Stroke (HCC) 06/2020   mild balance issues   Wears hearing aid in both ears     Past Surgical History:  Procedure Laterality Date   ABDOMINAL HYSTERECTOMY     APPENDECTOMY     AXILLARY LYMPH NODE BIOPSY Right 03/05/2017   Procedure: AXILLARY LYMPH NODE BIOPSY;  Surgeon: Dessa Reyes ORN, MD;  Location: [facility] ORS;  Service: General;  Laterality: Right;   CARDIOVERSION N/A 09/06/2023   Procedure: CARDIOVERSION;  Surgeon: Perla Evalene PARAS, MD;  Location: [facility];  Service: Cardiovascular;  Laterality: N/A;   CATARACT EXTRACTION Bilateral    CESAREAN SECTION     COLONOSCOPY WITH PROPOFOL  N/A 05/19/2015   Procedure: COLONOSCOPY WITH PROPOFOL ;  Surgeon: Lamar ONEIDA Holmes, MD;  Location: [facility] ENDOSCOPY;  Service: Endoscopy;  Laterality: N/A;   COLONOSCOPY WITH PROPOFOL  N/A 08/01/2019   Procedure: COLONOSCOPY WITH PROPOFOL ;  Surgeon: Jinny,  Darren, MD;  Location: [facility] ENDOSCOPY;  Service: Endoscopy;  Laterality: N/A;   EYE SURGERY     HAND SURGERY Left    HIP ARTHROPLASTY Left 07/21/2023   Procedure: ARTHROPLASTY BIPOLAR HIP (HEMIARTHROPLASTY);  Surgeon: Cleotilde Barrio, MD;  Location: [facility] OR[facility];  Service: Orthopedics;  Laterality: Left;   LEFT ATRIAL APPENDAGE OCCLUSION N/A 01/27/2021   Procedure: LEFT ATRIAL APPENDAGE OCCLUSION;  Surgeon: Cindie Ole DASEN, MD;  Location: [facility] INVASIVE CV LAB;  Service: Cardiovascular;  Laterality: N/A;   MASTECTOMY Right 11/07/2016   RESIDUAL INVASIVE MAMMARY CARCINOMA, SUBAREOLAR ANTERIOR TO PREVIOUS    OPEN REDUCTION INTERNAL FIXATION (ORIF) DISTAL RADIAL FRACTURE Right 04/11/2022   Procedure: OPEN REDUCTION INTERNAL FIXATION (ORIF) DISTAL RADIUS  FRACTURE;  Surgeon: Kathlynn Sharper, MD;  Location: [facility];  Service: Orthopedics;  Laterality: Right;   OPEN REDUCTION INTERNAL FIXATION (ORIF) DISTAL RADIAL FRACTURE Left 08/04/2022   Procedure: OPEN REDUCTION INTERNAL FIXATION (ORIF) DISTAL RADIUS FRACTURE;  Surgeon: Kathlynn Sharper, MD;  Location: [facility];  Service: Orthopedics;  Laterality: Left;   SENTINEL NODE BIOPSY Right 11/07/2016   Procedure: SENTINEL NODE BIOPSY;  Surgeon: Dessa Reyes ORN, MD;  Location: [facility] ORS;  Service: General;  Laterality: Right;   SIMPLE MASTECTOMY WITH AXILLARY SENTINEL NODE BIOPSY Right 11/07/2016   6 mm ER/PR 100%; Her 2 neu not overexpressed, T1b, N0.  Surgeon: Dessa Reyes ORN, MD;  Location: [facility] OR[facility];  Service: General;  Laterality: Right;   TEE WITHOUT CARDIOVERSION N/A 01/27/2021   Procedure: TRANSESOPHAGEAL ECHOCARDIOGRAM (TEE);  Surgeon: Cindie Ole DASEN, MD;  Location: [facility] INVASIVE CV LAB;  Service: Cardiovascular;  Laterality: N/A;   TEE WITHOUT CARDIOVERSION N/A 03/10/2021   Procedure: TRANSESOPHAGEAL ECHOCARDIOGRAM (TEE);  Surgeon: Loni Soyla LABOR, MD;  Location: [facility] ENDOSCOPY;  Service: Cardiology;  Laterality: N/A;   TONSILLECTOMY  AGE 30   UPPER GI ENDOSCOPY  01/12/06   hiatus hernia     reports that she has never smoked. She has never used smokeless tobacco. She reports that she does not currently use alcohol . She reports that she does not use drugs.  Allergies  Allergen Reactions   Gluten Meal Other (See Comments)    Family History  Problem Relation Age of Onset   Cancer Mother    Anemia Mother    Other Mother        lymphosarcoma   Alcohol  abuse Father    Prostate cancer Father    Breast cancer Sister        42; 1/2 sister, shared mother.   Cancer Sister        bile duct   Stroke Maternal Grandmother    Breast cancer Maternal Grandmother 68   Heart attack Maternal Grandfather    CAD Paternal Grandfather     Prior to Admission medications    Medication Sig Start Date End Date Taking? Authorizing Provider  ALPRAZolam  (XANAX ) 0.25 MG tablet Take 0.25 mg by mouth daily as needed. 03/14/23   [provider]  amiodarone  (PACERONE ) 200 MG tablet Take 0.5 tablets (100 mg total) by mouth daily. 07/11/23   Cindie Ole DASEN, MD  atorvastatin  (LIPITOR) 40 MG tablet Take 1 tablet (40 mg total) by mouth daily. 07/11/23   End, Lonni, MD  brimonidine  (ALPHAGAN ) 0.2 % ophthalmic solution Place 1 drop into both eyes 2 (two) times daily. 07/12/23   [provider]  Calcium  Carb-Cholecalciferol  (CALCIUM  600 + D PO) Take 1 tablet by mouth daily.    [provider]  Cholecalciferol  (VITAMIN  D3) 125 MCG (5000 UT) TABS Take 5,000 Units by mouth daily.    [provider]  docusate sodium  (COLACE) 100 MG capsule Take 100 mg by mouth daily.    [provider]  ferrous sulfate 325 (65 FE) MG tablet Take 325 mg by mouth daily with breakfast.    [provider]  fluticasone  (FLONASE ) 50 MCG/ACT nasal spray Place 2 sprays into both nostrils daily. 09/27/23   Cobb, Comer GAILS, NP  hydrALAZINE  (APRESOLINE ) 50 MG tablet Take 1 tablet (50 mg total) by mouth 3 (three) times daily as needed (SBP >160). 07/30/23   Von Bellis, MD  latanoprost  (XALATAN ) 0.005 % ophthalmic solution Place 1 drop into both eyes at bedtime.    [provider]  levothyroxine  (SYNTHROID ) 100 MCG tablet TAKE 1 TABLET BY MOUTH 30  MINUTES BEFORE BREAKFAST 02/02/22   Bertrum Charlie CROME, MD  losartan  (COZAAR ) 50 MG tablet Take 1.5 tablets (75 mg total) by mouth daily. 07/11/23 10/09/23  End, Lonni, MD  metoprolol  tartrate (LOPRESSOR ) 25 MG tablet Take 0.5 tablets (12.5 mg total) by mouth as needed (Afib episodes with HR > 120bpm.). 06/01/23 09/08/23  Riddle, Suzann, NP  Multiple Vitamin (MULTIVITAMIN WITH MINERALS) TABS tablet Take 1 tablet by mouth daily. Centrum Silver    [provider]  omeprazole  (PRILOSEC) 40 MG capsule  TAKE 1 CAPSULE BY MOUTH DAILY 02/02/22   Bertrum Charlie CROME, MD  potassium chloride  (KLOR-CON ) 20 MEQ packet DISSOLVE 2 PACKETS IN 4 OZ WATER OR JUICE AND DRINK DAILY 07/11/23   End, Lonni, MD  sertraline  (ZOLOFT ) 100 MG tablet Take 100 mg by mouth daily.    [provider]  simethicone  (MYLICON) 80 MG chewable tablet Chew 80 mg by mouth every 6 (six) hours as needed for flatulence.    [provider]    Physical Exam: Vitals:   11/26/23 0845 11/26/23 0900 11/26/23 1007 11/26/23 1057  BP: 138/64 (!) 140/66 (!) 182/72 (!) 141/71  Pulse: 72 70 80 69  Resp: (!) 23 20 16    Temp:   97.8 F (36.6 C)   TempSrc:   Oral   SpO2: 92% 94% 91% 94%    Constitutional: No acute distress Head: Atraumatic Eyes: Conjunctiva clear ENM: Moist mucous membranes. Normal dentition.  Neck: Supple Respiratory: Clear to auscultation bilaterally, no wheezing/rales/rhonchi. Normal respiratory effort. No accessory muscle use. . Cardiovascular: Regular rate and rhythm. No murmurs/rubs/gallops. Abdomen: Non-tender, non-distended. No masses. No rebound or guarding. Positive bowel sounds. Musculoskeletal: No joint deformity upper and lower extremities. Normal ROM, no contractures. Normal muscle tone.  Skin: No rashes, lesions, or ulcers.  Extremities: No peripheral edema. Palpable peripheral pulses. Neurologic: Alert, moving all 4 extremities. Psychiatric: Normal insight and judgement.   Labs on Admission: I have personally reviewed following labs and imaging studies  CBC: Recent Labs  Lab 11/26/23 0641  WBC 11.5*  NEUTROABS 10.4*  HGB 12.5  HCT 39.6  MCV 92.7  PLT 258   Basic Metabolic Panel: Recent Labs  Lab 11/26/23 0641  NA 140  K 3.0*  CL 105  CO2 23  GLUCOSE 135*  BUN 18  CREATININE 0.58  CALCIUM  9.0   GFR: CrCl cannot be calculated (Unknown ideal weight.). Liver Function Tests: No results for input(s): AST, ALT, ALKPHOS, BILITOT, PROT, ALBUMIN  in the  last 168 hours. No results for input(s): LIPASE, AMYLASE in the last 168 hours. No results for input(s): AMMONIA in the last 168 hours. Coagulation Profile: No results for input(s): INR,  PROTIME in the last 168 hours. Cardiac Enzymes: No results for input(s): CKTOTAL, CKMB, CKMBINDEX, TROPONINI in the last 168 hours. BNP (last 3 results) No results for input(s): PROBNP in the last 8760 hours. HbA1C: No results for input(s): HGBA1C in the last 72 hours. CBG: No results for input(s): GLUCAP in the last 168 hours. Lipid Profile: No results for input(s): CHOL, HDL, LDLCALC, TRIG, CHOLHDL, LDLDIRECT in the last 72 hours. Thyroid  Function Tests: No results for input(s): TSH, T4TOTAL, FREET4, T3FREE, THYROIDAB in the last 72 hours. Anemia Panel: No results for input(s): VITAMINB12, FOLATE, FERRITIN, TIBC, IRON , RETICCTPCT in the last 72 hours. Urine analysis:    Component Value Date/Time   COLORURINE STRAW (A) 09/08/2023 1531   APPEARANCEUR CLEAR (A) 09/08/2023 1531   LABSPEC 1.018 09/08/2023 1531   PHURINE 7.0 09/08/2023 1531   GLUCOSEU NEGATIVE 09/08/2023 1531   HGBUR NEGATIVE 09/08/2023 1531   BILIRUBINUR NEGATIVE 09/08/2023 1531   BILIRUBINUR Negative 11/24/2019 1352   KETONESUR NEGATIVE 09/08/2023 1531   PROTEINUR NEGATIVE 09/08/2023 1531   UROBILINOGEN 0.2 11/24/2019 1352   NITRITE NEGATIVE 09/08/2023 1531   LEUKOCYTESUR NEGATIVE 09/08/2023 1531    Radiological Exams on Admission: DG Chest Portable 1 View Result Date: 11/26/2023 CLINICAL DATA:  Preop exam EXAM: PORTABLE CHEST 1 VIEW COMPARISON:  Chest x-ray performed Sep 27, 2023 FINDINGS: Interstitial airspace opacities are present. Heart mediastinum are not significantly changed. The left atrial occlusion device is present. IMPRESSION: 1. Interstitial opacities, possibly sequelae of pulmonary vascular congestion. Electronically Signed   By: Maude Naegeli M.D.   On:  11/26/2023 08:12   CT Head Wo Contrast Result Date: 11/26/2023 CLINICAL DATA:  Head and neck trauma during a fall. EXAM: CT HEAD WITHOUT CONTRAST CT CERVICAL SPINE WITHOUT CONTRAST TECHNIQUE: Multidetector CT imaging of the head and cervical spine was performed following the standard protocol without intravenous contrast. Multiplanar CT image reconstructions of the cervical spine were also generated. RADIATION DOSE REDUCTION: This exam was performed according to the departmental dose-optimization program which includes automated exposure control, adjustment of the mA and/or kV according to patient size and/or use of iterative reconstruction technique. COMPARISON:  Head CT 06/06/2023 FINDINGS: CT HEAD FINDINGS Brain: There is no evidence for acute hemorrhage, hydrocephalus, mass lesion, or abnormal extra-axial fluid collection. No definite CT evidence for acute infarction. Diffuse loss of parenchymal volume is consistent with atrophy. Patchy low attenuation in the deep hemispheric and periventricular white matter is nonspecific, but likely reflects chronic microvascular ischemic demyelination. Vascular: No hyperdense vessel or unexpected calcification. Skull: No evidence for fracture. No worrisome lytic or sclerotic lesion. Sinuses/Orbits: The visualized paranasal sinuses and mastoid air cells are clear. Visualized portions of the globes and intraorbital fat are unremarkable. Other: None. CT CERVICAL SPINE FINDINGS Alignment: Straightening of normal cervical lordosis without evidence for traumatic subluxation. Trace anterolisthesis of C3 on 4 is compatible with degenerative disease. Skull base and vertebrae: No acute fracture. No primary bone lesion or focal pathologic process. Soft tissues and spinal canal: No prevertebral fluid or swelling. No visible canal hematoma. Disc levels: Loss of disc height with endplate spurring noted C5-6 and C6-7. Upper chest: No acute findings. Other: None IMPRESSION: 1. No acute  intracranial abnormality. 2. Atrophy with chronic small vessel white matter ischemic disease. 3. No cervical spine fracture or traumatic subluxation. Electronically Signed   By: Camellia Candle M.D.   On: 11/26/2023 07:38   CT Cervical Spine Wo Contrast Result Date: 11/26/2023 CLINICAL DATA:  Head and neck trauma during  a fall. EXAM: CT HEAD WITHOUT CONTRAST CT CERVICAL SPINE WITHOUT CONTRAST TECHNIQUE: Multidetector CT imaging of the head and cervical spine was performed following the standard protocol without intravenous contrast. Multiplanar CT image reconstructions of the cervical spine were also generated. RADIATION DOSE REDUCTION: This exam was performed according to the departmental dose-optimization program which includes automated exposure control, adjustment of the mA and/or kV according to patient size and/or use of iterative reconstruction technique. COMPARISON:  Head CT 06/06/2023 FINDINGS: CT HEAD FINDINGS Brain: There is no evidence for acute hemorrhage, hydrocephalus, mass lesion, or abnormal extra-axial fluid collection. No definite CT evidence for acute infarction. Diffuse loss of parenchymal volume is consistent with atrophy. Patchy low attenuation in the deep hemispheric and periventricular white matter is nonspecific, but likely reflects chronic microvascular ischemic demyelination. Vascular: No hyperdense vessel or unexpected calcification. Skull: No evidence for fracture. No worrisome lytic or sclerotic lesion. Sinuses/Orbits: The visualized paranasal sinuses and mastoid air cells are clear. Visualized portions of the globes and intraorbital fat are unremarkable. Other: None. CT CERVICAL SPINE FINDINGS Alignment: Straightening of normal cervical lordosis without evidence for traumatic subluxation. Trace anterolisthesis of C3 on 4 is compatible with degenerative disease. Skull base and vertebrae: No acute fracture. No primary bone lesion or focal pathologic process. Soft tissues and spinal  canal: No prevertebral fluid or swelling. No visible canal hematoma. Disc levels: Loss of disc height with endplate spurring noted C5-6 and C6-7. Upper chest: No acute findings. Other: None IMPRESSION: 1. No acute intracranial abnormality. 2. Atrophy with chronic small vessel white matter ischemic disease. 3. No cervical spine fracture or traumatic subluxation. Electronically Signed   By: Camellia Candle M.D.   On: 11/26/2023 07:38   DG Wrist Complete Right Result Date: 11/26/2023 CLINICAL DATA:  Fall.  Pain. EXAM: RIGHT WRIST - COMPLETE 3+ VIEW COMPARISON:  None Available. FINDINGS: Status post ORIF for distal radius fracture. No hardware complication. Chronic posttraumatic deformity noted ulnar styloid. No evidence for an acute fracture or dislocation. Bones are diffusely demineralized. IMPRESSION: 1. Status post ORIF for distal radius fracture. No hardware complication. 2. No acute bony findings. Electronically Signed   By: Camellia Candle M.D.   On: 11/26/2023 07:31   DG Ankle 2 Views Right Result Date: 11/26/2023 CLINICAL DATA:  Mechanical fall.  Pain. EXAM: RIGHT ANKLE - 2 VIEW COMPARISON:  None Available. FINDINGS: There is no evidence of fracture, dislocation, or joint effusion. There is no evidence of arthropathy or other focal bone abnormality. Soft tissues are unremarkable. IMPRESSION: Negative. Electronically Signed   By: Camellia Candle M.D.   On: 11/26/2023 07:29   DG Knee 2 Views Right Result Date: 11/26/2023 CLINICAL DATA:  Mechanical fall.  Pain.  EXAM: RIGHT KNEE - 1-2 VIEW COMPARISON:  None Available. FINDINGS: Two views study shows no acute fracture or dislocation. Loss of joint space noted medial compartment with degenerative spurring in all 3 compartments. Calcification of the lateral meniscus evident. IMPRESSION: Tricompartmental degenerative changes without acute bony findings. Electronically Signed   By: Camellia Candle M.D.   On: 11/26/2023 07:29   DG Hip Unilat W or Wo Pelvis 2-3 Views  Right Result Date: 11/26/2023 CLINICAL DATA:  Status post fall.  Pain. EXAM: DG HIP (WITH OR WITHOUT PELVIS) 2-3V RIGHT COMPARISON:  07/20/2023 FINDINGS: Interval left hip replacement. Acute comminuted fracture noted right superior pubic ramus with associated acute fracture of the inferior right pubic ramus. There is also an acute fracture in the right femoral neck with some valgus  deformity. IMPRESSION: 1. Acute fractures of the right superior and inferior pubic rami. 2. Acute fracture in the right femoral neck with some valgus deformity. Electronically Signed   By: Camellia Candle M.D.   On: 11/26/2023 07:26    EKG: Independently reviewed. nsr  Assessment/Plan Principal Problem:   Femoral neck fracture (HCC) Active Problems:   Atrial fibrillation S/p cardioversion 09/06/23 , prior watchman's device placement   Non-obstructive CAD (coronary artery disease)   Invasive ductal carcinoma of breast, female, right (HCC)   NAFLD (nonalcoholic fatty liver disease)   Chronic diastolic CHF (congestive heart failure) (HCC)   # Right femoral neck fracture # Pubic rami fractures S/p trip and fall at home. Also with right wrist pain, imaging of right knee, right ankle, and right wrist are negative - ortho consulted, no formal recs yet but patient is posted for operative repair tomorrow - no active CAD or symptoms of such, peri-op risk is mild/moderate, I see no indication for additional cardiac w/u pre-operatively, though shared with patient there are of course cardiac and other risks with surgery - pain control, bowel regimen - will need pt/ot consults post-op  # Med rec Med rec pending, will need to review meds to confirm appropriate meds are ordered  # A-fib S/p watchman device, cardioversion April of this year. Sinus rhythm currently - cont home amio, metop  # GAD - home xanax , sertraline   # Hypothyroid - home synthroid   # Hypokalemia 3.0 - home potassium 40 - f/u Mg  # HTN Here bp  elevated - resume home hydral, losartan , metoprolol   DVT prophylaxis: SCDs Code Status: full  Family Communication: daughter [name] updated telephonically 6/30  Consults called: orthopedics   Level of care: Telemetry Medical Status is: Inpatient Remains inpatient appropriate because: severity of illness    Devaughn KATHEE Ban MD [facility] Pager [phone]  If 7PM-7AM, please contact night-coverage [email or web address] Password [email or web address]  11/26/2023, 11:21 AM

## 2023-11-26 NOTE — ED Provider Notes (Signed)
 [facility] Provider Note    Event Date/Time   First MD Initiated Contact with Patient 11/26/23 [phone]     (approximate)   History   Fall   HPI  Sandra [name] is a 78 y.o. female   Past medical history of previous left hip replacement, paroxysmal atrial fibrillation not on anticoagulation, heart failure, previous stroke, presents Emergency Department with a fall after she slipped and fell on an object yesterday night.  She struck her right side and injured her right hip.  There is significant pain there.  She was unable to bear weight on the right side.  She did not strike her head.  She did injure her right wrist as well.  She has pain from her hip all the way down to her ankle.  She reports no other acute medical complaints.   External Medical Documents Reviewed: Hospital notes from earlier this year where she is managed for multifocal pneumonia      Physical Exam   Triage Vital Signs: ED Triage Vitals  Encounter Vitals Group     BP 11/26/23 0603 (!) 175/79     Girls Systolic BP Percentile --      Girls Diastolic BP Percentile --      Boys Systolic BP Percentile --      Boys Diastolic BP Percentile --      Pulse Rate 11/26/23 0603 87     Resp 11/26/23 0603 16     Temp 11/26/23 0603 98.1 F (36.7 C)     Temp Source 11/26/23 0603 Oral     SpO2 11/26/23 0603 99 %     Weight --      Height --      Head Circumference --      Peak Flow --      Pain Score 11/26/23 0604 10     Pain Loc --      Pain Education --      Exclude from Growth Chart --     Most recent vital signs: Vitals:   11/26/23 0603  BP: (!) 175/79  Pulse: 87  Resp: 16  Temp: 98.1 F (36.7 C)  SpO2: 99%    General: Awake, no distress.  CV:  Good peripheral perfusion.  Resp:  Normal effort.  Abd:  No distention. Other:  She is unable to range the right hip due to pain.  She winces in pain as I palpate the ankle and knee and hip on the right side.  She has some  tenderness palpation of the right wrist as well.  The remainder of the head to toe examination reveals no acute traumatic injuries including head trauma, neck T or L-spine deformities or tenderness, chest wall or abdomen elicits no tenderness on palpation.   ED Results / Procedures / Treatments   Labs (all labs ordered are listed, but only abnormal results are displayed) Labs Reviewed  BASIC METABOLIC PANEL WITH GFR - Abnormal; Notable for the following components:      Result Value   Potassium 3.0 (*)    Glucose, Bld 135 (*)    All other components within normal limits  CBC WITH DIFFERENTIAL/PLATELET - Abnormal; Notable for the following components:   WBC 11.5 (*)    Neutro Abs 10.4 (*)    Lymphs Abs 0.5 (*)    All other components within normal limits     I ordered and reviewed the above labs they are notable for white blood cell count 11.5  RADIOLOGY I independently reviewed and interpreted knee x-ray see no obvious fracture I also reviewed radiologist's formal read.   PROCEDURES:  Critical Care performed: No  Procedures   MEDICATIONS ORDERED IN ED: Medications  morphine  (PF) 2 MG/ML injection 2 mg (2 mg Intravenous Given 11/26/23 9361)  acetaminophen  (TYLENOL ) tablet 1,000 mg (1,000 mg Oral Given 11/26/23 9357)     IMPRESSION / MDM / ASSESSMENT AND PLAN / ED COURSE  I reviewed the triage vital signs and the nursing notes.                                Patient's presentation is most consistent with acute presentation with potential threat to life or bodily function.  Differential diagnosis includes, but is not limited to, hip fracture or dislocation, wrist fracture or dislocation, head injury or neck injury   The patient is on the cardiac monitor to evaluate for evidence of arrhythmia and/or significant heart rate changes.  MDM:    Fall with most pain on the right hip concerning for fracture dislocation, other areas of traumatic injury including potential  head or neck injury, get CT imaging of the head and neck as well as wrist x-rays and right lower extremity knee and ankle x-ray.  Will give IV morphine  for pain control.   Workup pending at this time signed out at 7 AM to the morning provider  FINAL CLINICAL IMPRESSION(S) / ED DIAGNOSES   Final diagnoses:  Fall, initial encounter     Rx / DC Orders   ED Discharge Orders     None        Note:  This document was prepared using Dragon voice recognition software and may include unintentional dictation errors.    Cyrena Mylar, MD 11/26/23 [phone]

## 2023-11-27 ENCOUNTER — Inpatient Hospital Stay: Payer: Medicare (Managed Care)

## 2023-11-27 ENCOUNTER — Encounter: Payer: Self-pay | Admitting: Obstetrics and Gynecology

## 2023-11-27 ENCOUNTER — Inpatient Hospital Stay: Payer: Medicare (Managed Care) | Admitting: Anesthesiology

## 2023-11-27 ENCOUNTER — Encounter
Admission: EM | Disposition: A | Discharge status: Home Health | Payer: Self-pay | Source: Home / Self Care | Attending: Osteopathic Medicine

## 2023-11-27 ENCOUNTER — Other Ambulatory Visit: Payer: Self-pay

## 2023-11-27 DIAGNOSIS — S72001A Fracture of unspecified part of neck of right femur, initial encounter for closed fracture: Secondary | ICD-10-CM | POA: Diagnosis not present

## 2023-11-27 HISTORY — PX: HIP ARTHROPLASTY: SHX981

## 2023-11-27 LAB — CBC
HCT: 31.7 % — ABNORMAL LOW (ref 36.0–46.0)
Hemoglobin: 10.4 g/dL — ABNORMAL LOW (ref 12.0–15.0)
MCH: 30.1 pg (ref 26.0–34.0)
MCHC: 32.8 g/dL (ref 30.0–36.0)
MCV: 91.6 fL (ref 80.0–100.0)
Platelets: 194 10*3/uL (ref 150–400)
RBC: 3.46 MIL/uL — ABNORMAL LOW (ref 3.87–5.11)
RDW: 13.2 % (ref 11.5–15.5)
WBC: 6 10*3/uL (ref 4.0–10.5)
nRBC: 0 % (ref 0.0–0.2)

## 2023-11-27 LAB — PROTIME-INR
INR: 1.1 (ref 0.8–1.2)
Prothrombin Time: 15.2 s (ref 11.4–15.2)

## 2023-11-27 LAB — BASIC METABOLIC PANEL WITH GFR
Anion gap: 9 (ref 5–15)
BUN: 16 mg/dL (ref 8–23)
CO2: 24 mmol/L (ref 22–32)
Calcium: 8.5 mg/dL — ABNORMAL LOW (ref 8.9–10.3)
Chloride: 102 mmol/L (ref 98–111)
Creatinine, Ser: 0.62 mg/dL (ref 0.44–1.00)
GFR, Estimated: >60 mL/min (ref >60)
Glucose, Bld: 106 mg/dL — ABNORMAL HIGH (ref 70–99)
Potassium: 4 mmol/L (ref 3.5–5.1)
Sodium: 135 mmol/L (ref 135–145)

## 2023-11-27 LAB — TYPE AND SCREEN
ABO/RH(D): B POS
Antibody Screen: NEGATIVE

## 2023-11-27 SURGERY — HEMIARTHROPLASTY (BIPOLAR) HIP, POSTERIOR APPROACH FOR FRACTURE
Anesthesia: General | Site: Hip | Laterality: Right

## 2023-11-27 MED ORDER — CEFAZOLIN SODIUM-DEXTROSE 2-4 GM/100ML-% IV SOLN
INTRAVENOUS | Status: AC
  Filled 2023-11-27: qty 100

## 2023-11-27 MED ORDER — TRANEXAMIC ACID-NACL 1000-0.7 MG/100ML-% IV SOLN
1000.0000 mg | Freq: Once | INTRAVENOUS | Status: AC
  Administered 2023-11-27: 1000 mg via INTRAVENOUS

## 2023-11-27 MED ORDER — TRANEXAMIC ACID-NACL 1000-0.7 MG/100ML-% IV SOLN
INTRAVENOUS | Status: AC
Start: 2023-11-27 — End: 2023-11-27
  Filled 2023-11-27: qty 100

## 2023-11-27 MED ORDER — OXYCODONE HCL 5 MG PO TABS
5.0000 mg | ORAL_TABLET | ORAL | Status: DC | PRN
  Administered 2023-11-27 – 2023-11-28 (×2): 5 mg via ORAL
  Administered 2023-11-28: 10 mg via ORAL
  Administered 2023-11-29: 5 mg via ORAL
  Administered 2023-11-30 (×2): 10 mg via ORAL
  Administered 2023-11-30: 5 mg via ORAL
  Administered 2023-12-01 – 2023-12-03 (×6): 10 mg via ORAL
  Administered 2023-12-04: 5 mg via ORAL
  Administered 2023-12-04 – 2023-12-11 (×14): 10 mg via ORAL
  Filled 2023-11-27 (×4): qty 2
  Filled 2023-11-27: qty 1
  Filled 2023-11-27 (×2): qty 2
  Filled 2023-11-27: qty 1
  Filled 2023-11-27 (×6): qty 2
  Filled 2023-11-27: qty 1
  Filled 2023-11-27 (×4): qty 2
  Filled 2023-11-27: qty 1
  Filled 2023-11-27 (×2): qty 2
  Filled 2023-11-27: qty 1
  Filled 2023-11-27 (×6): qty 2

## 2023-11-27 MED ORDER — BISACODYL 10 MG RE SUPP: 10.0000 mg | Freq: Every day | RECTAL | Status: DC | PRN

## 2023-11-27 MED ORDER — ONDANSETRON HCL 4 MG/2ML IJ SOLN
4.0000 mg | Freq: Four times a day (QID) | INTRAMUSCULAR | Status: DC | PRN
  Administered 2023-12-02: 4 mg via INTRAVENOUS
  Filled 2023-11-27: qty 2

## 2023-11-27 MED ORDER — METOCLOPRAMIDE HCL 5 MG PO TABS: 5.0000 mg | ORAL_TABLET | Freq: Three times a day (TID) | ORAL | Status: DC | PRN

## 2023-11-27 MED ORDER — ACETAMINOPHEN 10 MG/ML IV SOLN
INTRAVENOUS | Status: DC | PRN
  Administered 2023-11-27: 1000 mg via INTRAVENOUS

## 2023-11-27 MED ORDER — PROPOFOL 10 MG/ML IV BOLUS
INTRAVENOUS | Status: AC
Start: 2023-11-27 — End: 2023-11-27
  Filled 2023-11-27: qty 20

## 2023-11-27 MED ORDER — TRANEXAMIC ACID-NACL 1000-0.7 MG/100ML-% IV SOLN
INTRAVENOUS | Status: DC | PRN
  Administered 2023-11-27: 1000 mg via INTRAVENOUS

## 2023-11-27 MED ORDER — LACTATED RINGERS IV SOLN: INTRAVENOUS | Status: DC | PRN

## 2023-11-27 MED ORDER — SODIUM CHLORIDE 0.9 % IR SOLN
Status: DC | PRN
  Administered 2023-11-27: 3000 mL

## 2023-11-27 MED ORDER — PHENOL 1.4 % MT LIQD: 1.0000 | OROMUCOSAL | Status: DC | PRN

## 2023-11-27 MED ORDER — ROCURONIUM BROMIDE 100 MG/10ML IV SOLN
INTRAVENOUS | Status: DC | PRN
  Administered 2023-11-27 (×2): 30 mg via INTRAVENOUS

## 2023-11-27 MED ORDER — OXYCODONE HCL 5 MG PO TABS
2.5000 mg | ORAL_TABLET | ORAL | Status: DC | PRN
  Administered 2023-11-27 – 2023-12-11 (×9): 5 mg via ORAL
  Filled 2023-11-27 (×8): qty 1

## 2023-11-27 MED ORDER — LACTATED RINGERS IV SOLN: INTRAVENOUS | Status: DC

## 2023-11-27 MED ORDER — FENTANYL CITRATE (PF) 100 MCG/2ML IJ SOLN
INTRAMUSCULAR | Status: AC
  Filled 2023-11-27: qty 2

## 2023-11-27 MED ORDER — SENNOSIDES-DOCUSATE SODIUM 8.6-50 MG PO TABS
1.0000 | ORAL_TABLET | Freq: Every evening | ORAL | Status: DC | PRN
  Administered 2023-12-11: 1 via ORAL
  Filled 2023-11-27: qty 1

## 2023-11-27 MED ORDER — BUPIVACAINE LIPOSOME 1.3 % IJ SUSP
INTRAMUSCULAR | Status: DC | PRN
  Administered 2023-11-27: 50 mL via INTRAMUSCULAR

## 2023-11-27 MED ORDER — DEXAMETHASONE SODIUM PHOSPHATE 10 MG/ML IJ SOLN
INTRAMUSCULAR | Status: DC | PRN
  Administered 2023-11-27: 10 mg via INTRAVENOUS

## 2023-11-27 MED ORDER — METHOCARBAMOL 500 MG PO TABS
500.0000 mg | ORAL_TABLET | Freq: Three times a day (TID) | ORAL | Status: DC | PRN
  Administered 2023-11-27 – 2023-12-11 (×16): 500 mg via ORAL
  Filled 2023-11-27 (×16): qty 1

## 2023-11-27 MED ORDER — MENTHOL 3 MG MT LOZG
1.0000 | LOZENGE | OROMUCOSAL | Status: DC | PRN
Start: 2023-11-27 — End: 2023-12-12

## 2023-11-27 MED ORDER — ACETAMINOPHEN 10 MG/ML IV SOLN
INTRAVENOUS | Status: AC
  Filled 2023-11-27: qty 100

## 2023-11-27 MED ORDER — PHENYLEPHRINE HCL-NACL 20-0.9 MG/250ML-% IV SOLN
INTRAVENOUS | Status: AC
  Filled 2023-11-27: qty 250

## 2023-11-27 MED ORDER — LIDOCAINE HCL (CARDIAC) PF 100 MG/5ML IV SOSY
PREFILLED_SYRINGE | INTRAVENOUS | Status: DC | PRN
  Administered 2023-11-27: 60 mg via INTRAVENOUS

## 2023-11-27 MED ORDER — KETOROLAC TROMETHAMINE 15 MG/ML IJ SOLN
7.5000 mg | Freq: Four times a day (QID) | INTRAMUSCULAR | Status: DC
  Administered 2023-11-27: 7.5 mg via INTRAVENOUS

## 2023-11-27 MED ORDER — FENTANYL CITRATE (PF) 100 MCG/2ML IJ SOLN
INTRAMUSCULAR | Status: DC | PRN
  Administered 2023-11-27: 25 ug via INTRAVENOUS

## 2023-11-27 MED ORDER — ACETAMINOPHEN 500 MG PO TABS
1000.0000 mg | ORAL_TABLET | Freq: Three times a day (TID) | ORAL | Status: DC
  Administered 2023-11-27 – 2023-12-12 (×37): 1000 mg via ORAL
  Filled 2023-11-27 (×40): qty 2

## 2023-11-27 MED ORDER — 0.9 % SODIUM CHLORIDE (POUR BTL) OPTIME
TOPICAL | Status: DC | PRN
  Administered 2023-11-27: 500 mL

## 2023-11-27 MED ORDER — KETOROLAC TROMETHAMINE 15 MG/ML IJ SOLN
7.5000 mg | Freq: Four times a day (QID) | INTRAMUSCULAR | Status: AC
  Administered 2023-11-27 – 2023-11-28 (×3): 7.5 mg via INTRAVENOUS
  Filled 2023-11-27 (×3): qty 1

## 2023-11-27 MED ORDER — KETOROLAC TROMETHAMINE 15 MG/ML IJ SOLN
INTRAMUSCULAR | Status: AC
  Filled 2023-11-27: qty 1

## 2023-11-27 MED ORDER — DOCUSATE SODIUM 100 MG PO CAPS
100.0000 mg | ORAL_CAPSULE | Freq: Two times a day (BID) | ORAL | Status: DC
  Administered 2023-11-27 – 2023-12-12 (×27): 100 mg via ORAL
  Filled 2023-11-27 (×29): qty 1

## 2023-11-27 MED ORDER — CEFAZOLIN SODIUM-DEXTROSE 1-4 GM/50ML-% IV SOLN
1.0000 g | Freq: Four times a day (QID) | INTRAVENOUS | Status: AC
  Administered 2023-11-27 – 2023-11-28 (×2): 1 g via INTRAVENOUS
  Filled 2023-11-27 (×2): qty 50

## 2023-11-27 MED ORDER — BUPIVACAINE HCL (PF) 0.5 % IJ SOLN
INTRAMUSCULAR | Status: AC
  Filled 2023-11-27: qty 30

## 2023-11-27 MED ORDER — FLEET ENEMA RE ENEM: 1.0000 | ENEMA | Freq: Once | RECTAL | Status: DC | PRN

## 2023-11-27 MED ORDER — ZOLPIDEM TARTRATE 5 MG PO TABS
5.0000 mg | ORAL_TABLET | Freq: Every evening | ORAL | Status: DC | PRN
  Administered 2023-11-29 – 2023-12-11 (×7): 5 mg via ORAL
  Filled 2023-11-27 (×7): qty 1

## 2023-11-27 MED ORDER — ONDANSETRON HCL 4 MG/2ML IJ SOLN
INTRAMUSCULAR | Status: DC | PRN
  Administered 2023-11-27: 4 mg via INTRAVENOUS

## 2023-11-27 MED ORDER — ONDANSETRON HCL 4 MG PO TABS: 4.0000 mg | ORAL_TABLET | Freq: Four times a day (QID) | ORAL | Status: DC | PRN

## 2023-11-27 MED ORDER — HYDROMORPHONE HCL 1 MG/ML IJ SOLN
0.2000 mg | INTRAMUSCULAR | Status: DC | PRN
  Administered 2023-11-30: 0.2 mg via INTRAVENOUS
  Administered 2023-12-01 – 2023-12-06 (×2): 0.4 mg via INTRAVENOUS
  Filled 2023-11-27 (×3): qty 0.5

## 2023-11-27 MED ORDER — SUGAMMADEX SODIUM 200 MG/2ML IV SOLN
INTRAVENOUS | Status: DC | PRN
  Administered 2023-11-27: 200 mg via INTRAVENOUS

## 2023-11-27 MED ORDER — ALPRAZOLAM 0.25 MG PO TABS
0.2500 mg | ORAL_TABLET | Freq: Two times a day (BID) | ORAL | Status: DC | PRN
  Administered 2023-11-27 – 2023-12-01 (×6): 0.25 mg via ORAL
  Administered 2023-12-02 – 2023-12-05 (×4): 0.5 mg via ORAL
  Administered 2023-12-07 – 2023-12-11 (×5): 0.25 mg via ORAL
  Filled 2023-11-27: qty 1
  Filled 2023-11-27: qty 2
  Filled 2023-11-27 (×2): qty 1
  Filled 2023-11-27 (×3): qty 2
  Filled 2023-11-27 (×2): qty 1
  Filled 2023-11-27 (×2): qty 2
  Filled 2023-11-27 (×4): qty 1

## 2023-11-27 MED ORDER — TRANEXAMIC ACID-NACL 1000-0.7 MG/100ML-% IV SOLN
INTRAVENOUS | Status: AC
  Filled 2023-11-27: qty 100

## 2023-11-27 MED ORDER — TRAMADOL HCL 50 MG PO TABS
50.0000 mg | ORAL_TABLET | Freq: Four times a day (QID) | ORAL | Status: DC | PRN
  Administered 2023-12-02 – 2023-12-11 (×6): 50 mg via ORAL
  Filled 2023-11-27 (×6): qty 1

## 2023-11-27 MED ORDER — BUPIVACAINE LIPOSOME 1.3 % IJ SUSP
INTRAMUSCULAR | Status: AC
  Filled 2023-11-27: qty 20

## 2023-11-27 MED ORDER — FENTANYL CITRATE (PF) 100 MCG/2ML IJ SOLN: 25.0000 ug | INTRAMUSCULAR | Status: DC | PRN

## 2023-11-27 MED ORDER — ENOXAPARIN SODIUM 40 MG/0.4ML IJ SOSY
40.0000 mg | PREFILLED_SYRINGE | INTRAMUSCULAR | Status: DC
  Administered 2023-11-28 – 2023-12-12 (×15): 40 mg via SUBCUTANEOUS
  Filled 2023-11-27 (×15): qty 0.4

## 2023-11-27 MED ORDER — PROPOFOL 10 MG/ML IV BOLUS
INTRAVENOUS | Status: AC
  Filled 2023-11-27: qty 20

## 2023-11-27 MED ORDER — OXYCODONE HCL 5 MG PO TABS
ORAL_TABLET | ORAL | Status: AC
  Filled 2023-11-27: qty 1

## 2023-11-27 MED ORDER — SENNA 8.6 MG PO TABS
1.0000 | ORAL_TABLET | Freq: Two times a day (BID) | ORAL | Status: DC
  Administered 2023-11-27 – 2023-12-11 (×13): 8.6 mg via ORAL
  Filled 2023-11-27 (×25): qty 1

## 2023-11-27 MED ORDER — ONDANSETRON HCL 4 MG/2ML IJ SOLN: 4.0000 mg | Freq: Once | INTRAMUSCULAR | Status: DC | PRN

## 2023-11-27 MED ORDER — MIDAZOLAM HCL 2 MG/2ML IJ SOLN
INTRAMUSCULAR | Status: AC
Start: 2023-11-27 — End: 2023-11-27
  Filled 2023-11-27: qty 2

## 2023-11-27 MED ORDER — PROPOFOL 10 MG/ML IV BOLUS
INTRAVENOUS | Status: DC | PRN
  Administered 2023-11-27: 100 mg via INTRAVENOUS

## 2023-11-27 MED ORDER — ACETAMINOPHEN 10 MG/ML IV SOLN: 1000.0000 mg | Freq: Once | INTRAVENOUS | Status: DC | PRN

## 2023-11-27 MED ORDER — METOCLOPRAMIDE HCL 5 MG/ML IJ SOLN: 5.0000 mg | Freq: Three times a day (TID) | INTRAMUSCULAR | Status: DC | PRN

## 2023-11-27 MED ORDER — EPHEDRINE SULFATE-NACL 50-0.9 MG/10ML-% IV SOSY
PREFILLED_SYRINGE | INTRAVENOUS | Status: DC | PRN
  Administered 2023-11-27 (×2): 5 mg via INTRAVENOUS

## 2023-11-27 SURGICAL SUPPLY — 53 items
BIT DRILL 12.7X2STRG SHNK (BIT) IMPLANT
BLADE SAW SGTL 13X75X1.27 (BLADE) ×1 IMPLANT
BNDG COHESIVE 4X5 TAN STRL LF (GAUZE/BANDAGES/DRESSINGS) ×1 IMPLANT
BOWL CEMENT MIXING ADV NOZZLE (MISCELLANEOUS) ×1 IMPLANT
CEMENT BONE 10-PACK (Cement) ×2 IMPLANT
CHLORAPREP W/TINT 26 (MISCELLANEOUS) ×2 IMPLANT
COVER MAYO STAND STRL (DRAPES) ×1 IMPLANT
DRAPE INCISE IOBAN 66X60 STRL (DRAPES) ×1 IMPLANT
DRAPE SHEET LG 3/4 BI-LAMINATE (DRAPES) ×1 IMPLANT
DRAPE SURG ORHT 6 SPLT 77X108 (DRAPES) ×1 IMPLANT
DRAPE TABLE BACK 80X90 (DRAPES) ×1 IMPLANT
DRSG OPSITE POSTOP 4X10 (GAUZE/BANDAGES/DRESSINGS) IMPLANT
DRSG OPSITE POSTOP 4X8 (GAUZE/BANDAGES/DRESSINGS) IMPLANT
ELECTRODE REM PT RTRN 9FT ADLT (ELECTROSURGICAL) ×1 IMPLANT
GAUZE XEROFORM 1X8 LF (GAUZE/BANDAGES/DRESSINGS) ×1 IMPLANT
GLOVE BIOGEL PI IND STRL 8 (GLOVE) ×2 IMPLANT
GLOVE SKINSENSE STRL SZ8.0 LF (GLOVE) ×1 IMPLANT
GLOVE SURG ORTHO 8.0 STRL STRW (GLOVE) ×2 IMPLANT
GOWN SRG LRG LVL 4 IMPRV REINF (GOWNS) ×1 IMPLANT
GOWN SRG XL LONG LVL 3 NONREIN (GOWNS) ×1 IMPLANT
HEAD UNPLR 45XMDLR STRL HIP (Orthopedic Implant) IMPLANT
HOOD PEEL AWAY T7 (MISCELLANEOUS) ×2 IMPLANT
IMMBOLIZER KNEE 19 BLUE UNIV (SOFTGOODS) ×1 IMPLANT
IMMOB KNEE 14 THIGH 24 706614 (SOFTGOODS) IMPLANT
KIT DRAIN HEMOVAC JP 7FR 400ML (MISCELLANEOUS) IMPLANT
KIT PREP HIP W/CEMENT RESTRICT (Miscellaneous) ×1 IMPLANT
KIT TURNOVER KIT A (KITS) ×1 IMPLANT
MANIFOLD NEPTUNE II (INSTRUMENTS) ×1 IMPLANT
MAT ABSORB FLUID 56X50 GRAY (MISCELLANEOUS) ×1 IMPLANT
NDL HYPO 22X1.5 SAFETY MO (MISCELLANEOUS) ×1 IMPLANT
NDL MAYO CATGUT SZ1 (NEEDLE) ×1 IMPLANT
NEEDLE HYPO 22X1.5 SAFETY MO (MISCELLANEOUS) ×1 IMPLANT
NEEDLE MAYO CATGUT SZ1 (NEEDLE) ×1 IMPLANT
NS IRRIG 500ML POUR BTL (IV SOLUTION) ×1 IMPLANT
PACK HIP PROSTHESIS (MISCELLANEOUS) ×1 IMPLANT
PENCIL SMOKE EVACUATOR (MISCELLANEOUS) ×1 IMPLANT
SLEEVE UNITRAX V40 STD (Orthopedic Implant) IMPLANT
SOL .9 NS 3000ML IRR UROMATIC (IV SOLUTION) ×1 IMPLANT
SPACER OSTEO CEMENT 13HIP (Orthopedic Implant) IMPLANT
STAPLER SKIN PROX 35W (STAPLE) ×1 IMPLANT
STEM HIP ACCOLADE SZ4 35X137 (Stem) IMPLANT
SUT ETHIBOND #5 BRAIDED 30INL (SUTURE) ×1 IMPLANT
SUT VIC AB 0 CT1 36 (SUTURE) ×1 IMPLANT
SUT VIC AB 2-0 CT2 27 (SUTURE) ×2 IMPLANT
SYR 20ML LL LF (SYRINGE) ×2 IMPLANT
TAPE MICROFOAM 4IN (TAPE) ×1 IMPLANT
TIP FAN IRRIG PULSAVAC PLUS (DISPOSABLE) ×1 IMPLANT
TRAP FLUID SMOKE EVACUATOR (MISCELLANEOUS) ×1 IMPLANT
TRAY CATH INTERMITTENT SS 16FR (CATHETERS) IMPLANT
TRAY FOLEY SLVR 16FR LF STAT (SET/KITS/TRAYS/PACK) IMPLANT
TUBE KAMVAC SUCTION (TUBING) ×1 IMPLANT
TUBE SUCT KAM VAC (TUBING) ×1 IMPLANT
WATER STERILE IRR 1000ML POUR (IV SOLUTION) ×1 IMPLANT

## 2023-11-27 NOTE — Consult Note (Signed)
 ORTHOPAEDIC CONSULTATION  REQUESTING PHYSICIAN: Marsa Edelman, DO  Chief Complaint:   R hip pain  History of Present Illness: Daughter, Sandra [name], at bedside.   Sandra [name] is a 78 y.o. female who had a fall yesterday.  The patient noted immediate hip pain and inability to ambulate.  The patient ambulates unassisted at baseline. She lives at home with her husband. She is the primary caregiver for him. Pain is worse with any sort of movement.  X-rays in the emergency department show a right mildly displaced femoral neck fracture.  Of note, she has had a prior L hip hemiarthroplasty by Dr. Cleotilde on 07/21/23. She went home afterwards due to care for her husband and lack of available SNF facility. She has also had ORIF of bilateral distal radius by Dr. Kathlynn.   She has a history of atrial fibrillation and had cardioversion in April 2025. She has been in normal rhythm since that time and off of Eliquis  for the past month. She does have a history of CHF and has been getting fluid overloaded after her prior surgeries. Patient is extremely hard of hearing if she does not have hearing aids.    Past Medical History:  Diagnosis Date   (HFpEF) heart failure with preserved ejection fraction (HCC)    a. 08/2019 Echo: EF 60-65%, no rwma, mild LVH, Gr2 DD, nl RV fxn. Nl pASP. Mildly dil LA. Mild to mod MR.   Anemia    Anxiety    Arthritis    BRCA gene mutation negative 10/2016   NEGATIVE: [facility]   Breast cancer (HCC) 10/16/2016   T1c,N0;, GRADE I/III, 1.6cm. ER/PR pos  HER2 not over expressed, Right Upper Outer   Celiac disease    Celiac syndrome    Cerebellar hemorrhage, acute (HCC) 07/17/2020   CHF (congestive heart failure) (HCC)    Depression    Dyspnea    WITH EXERTION   Fatty liver    GERD (gastroesophageal reflux disease)    OCC   Glaucoma    right eye   Headache    H/O MIGRAINES AS TEENAGER.   Heart murmur    a.  08/2019 Echo: mild to mod MR.   Hypertension    Hypothyroidism    Non-obstructive CAD (coronary artery disease)    a. 01/2017 MV: Hypertensive response. No ischemia/infarct. EF >65%; b. 09/2019 Cor CTA: Ca2+ = 9.29 (36th percentile). LAD calcified plaque (0-24%), otw nl. Multipel bilat pulm nodules up to 7mm.   Osteoporosis    PAF (paroxysmal atrial fibrillation) (HCC)    a. 02/2020 Zio: predominantly RSR, 65 (50-105), rare PACs/PVCs, 8 beats NSVT, multiple episodes of PAF lasting up to 1hr . Avg AF rate 130 (93-170). AF burden <1%. Triggered events = RSR, PACs, and PAF; b. CHA2DS2VASc = 6.   Pneumonia    YEARS AGO   Pre-diabetes    Presence of Watchman left atrial appendage closure device 01/2021   Pulmonary nodules    a. 09/2019 Cor CTA: incidental finding of multiple bilat pulm nodules; b. 12/2019 High Res CT: stable, scattered solid pulm nodules.   Raynaud's disease    RLS (restless legs syndrome)    Stroke (HCC) 06/2020   mild balance issues   Wears hearing aid in both ears    Past Surgical History:  Procedure Laterality Date   ABDOMINAL HYSTERECTOMY     APPENDECTOMY     AXILLARY LYMPH NODE BIOPSY Right 03/05/2017   Procedure: AXILLARY LYMPH NODE BIOPSY;  Surgeon: Dessa Reyes ORN,  MD;  Location: [facility] ORS;  Service: General;  Laterality: Right;   CARDIOVERSION N/A 09/06/2023   Procedure: CARDIOVERSION;  Surgeon: Perla Evalene PARAS, MD;  Location: [facility];  Service: Cardiovascular;  Laterality: N/A;   CATARACT EXTRACTION Bilateral    CESAREAN SECTION     COLONOSCOPY WITH PROPOFOL  N/A 05/19/2015   Procedure: COLONOSCOPY WITH PROPOFOL ;  Surgeon: Lamar ONEIDA Holmes, MD;  Location: [facility] ENDOSCOPY;  Service: Endoscopy;  Laterality: N/A;   COLONOSCOPY WITH PROPOFOL  N/A 08/01/2019   Procedure: COLONOSCOPY WITH PROPOFOL ;  Surgeon: Jinny Carmine, MD;  Location: [facility] ENDOSCOPY;  Service: Endoscopy;  Laterality: N/A;   EYE SURGERY     HAND SURGERY Left    HIP ARTHROPLASTY Left 07/21/2023    Procedure: ARTHROPLASTY BIPOLAR HIP (HEMIARTHROPLASTY);  Surgeon: Cleotilde Barrio, MD;  Location: [facility] ORS;  Service: Orthopedics;  Laterality: Left;   LEFT ATRIAL APPENDAGE OCCLUSION N/A 01/27/2021   Procedure: LEFT ATRIAL APPENDAGE OCCLUSION;  Surgeon: Cindie Ole ONEIDA, MD;  Location: [facility]AS[facility] CV [facility];  Service: Cardiovascular;  Laterality: N/A;   MASTECTOMY Right 11/07/2016   RESIDUAL INVASIVE MAMMARY CARCINOMA, SUBAREOLAR ANTERIOR TO PREVIOUS    OPEN REDUCTION INTERNAL FIXATION (ORIF) DISTAL RADIAL FRACTURE Right 04/11/2022   Procedure: OPEN REDUCTION INTERNAL FIXATION (ORIF) DISTAL RADIUS FRACTURE;  Surgeon: Kathlynn Sharper, MD;  Location: [facility];  Service: Orthopedics;  Laterality: Right;   OPEN REDUCTION INTERNAL FIXATION (ORIF) DISTAL RADIAL FRACTURE Left 08/04/2022   Procedure: OPEN REDUCTION INTERNAL FIXATION (ORIF) DISTAL RADIUS FRACTURE;  Surgeon: Kathlynn Sharper, MD;  Location: [facility];  Service: Orthopedics;  Laterality: Left;   SENTINEL NODE BIOPSY Right 11/07/2016   Procedure: SENTINEL NODE BIOPSY;  Surgeon: Dessa Reyes ORN, MD;  Location: [facility];  Service: General;  Laterality: Right;   SIMPLE MASTECTOMY WITH AXILLARY SENTINEL NODE BIOPSY Right 11/07/2016   6 mm ER/PR 100%; Her 2 neu not overexpressed, T1b, N0.  Surgeon: Dessa Reyes ORN, MD;  Location: [facility] OR[facility];  Service: General;  Laterality: Right;   TEE WITHOUT CARDIOVERSION N/A 01/27/2021   Procedure: TRANSESOPHAGEAL ECHOCARDIOGRAM (TEE);  Surgeon: Cindie Ole ONEIDA, MD;  Location: [facility] CV LAB;  Service: Cardiovascular;  Laterality: N/A;   TEE WITHOUT CARDIOVERSION N/A 03/10/2021   Procedure: TRANSESOPHAGEAL ECHOCARDIOGRAM (TEE);  Surgeon: Loni Soyla LABOR, MD;  Location: [facility] ENDOSCOPY;  Service: Cardiology;  Laterality: N/A;   TONSILLECTOMY  AGE 41   UPPER GI ENDOSCOPY  01/12/06   hiatus hernia   Social History   Socioeconomic History   Marital status: Married    Spouse name: Barrio    Number of children: 2   Years of education: Not on file   Highest education level: Some college, no degree  Occupational History   Occupation: retired  Tobacco Use   Smoking status: Never   Smokeless tobacco: Never  Vaping Use   Vaping status: Never Used  Substance and Sexual Activity   Alcohol  use: Not Currently   Drug use: No   Sexual activity: Not Currently    Birth control/protection: None  Other Topics Concern   Not on file  Social History Narrative   Lives with Husband   Right handed   Drinks 2-3 cups caffiene daily   Social Drivers of Health   Financial Resource Strain: Low Risk  (05/18/2021)   Overall Financial Resource Strain (CARDIA)    Difficulty of Paying Living Expenses: Not hard at all  Food Insecurity: No Food Insecurity (11/27/2023)   Hunger Vital Sign    Worried About Running  Out of Food in the Last Year: Never true    Ran Out of Food in the Last Year: Never true  Transportation Needs: No Transportation Needs (11/26/2023)   PRAPARE - Administrator, Civil Service (Medical): No    Lack of Transportation (Non-Medical): No  Recent Concern: Transportation Needs - Unmet Transportation Needs (09/10/2023)   PRAPARE - Transportation    Lack of Transportation (Medical): Yes    Lack of Transportation (Non-Medical): Yes  Physical Activity: Insufficiently Active (05/18/2021)   Exercise Vital Sign    Days of Exercise per Week: 3 days    Minutes of Exercise per Session: 30 min  Stress: No Stress Concern Present (05/18/2021)   [employer] of Occupational Health - Occupational Stress Questionnaire    Feeling of Stress : Only a little  Social Connections: Unknown (11/27/2023)   Social Connection and Isolation Panel    Frequency of Communication with Friends and Family: Not on file    Frequency of Social Gatherings with Friends and Family: Not on file    Attends Religious Services: Not on file    Active Member of Clubs or Organizations: Yes    Attends  Banker Meetings: Not on file    Marital Status: Not on file   Family History  Problem Relation Age of Onset   Cancer Mother    Anemia Mother    Other Mother        lymphosarcoma   Alcohol  abuse Father    Prostate cancer Father    Breast cancer Sister        49; 1/2 sister, shared mother.   Cancer Sister        bile duct   Stroke Maternal Grandmother    Breast cancer Maternal Grandmother 68   Heart attack Maternal Grandfather    CAD Paternal Grandfather    Allergies  Allergen Reactions   Gluten Meal Other (See Comments)   Prior to Admission medications   Medication Sig Start Date End Date Taking? Authorizing Provider  acetaminophen  (TYLENOL ) 325 MG tablet 2 tablets Oral every 6 hours as needed for mild pain , fever or headache   Yes [provider]  ALPRAZolam  (XANAX ) 0.25 MG tablet Take 0.25 mg by mouth daily as needed. 03/14/23  Yes [provider]  amiodarone  (PACERONE ) 200 MG tablet Take 0.5 tablets (100 mg total) by mouth daily. 07/11/23  Yes Cindie Ole DASEN, MD  apixaban  (ELIQUIS ) 2.5 MG TABS tablet 1 tablet Orally Twice a day for abnormal heart rhythm for total 30 days. last dose 10/06/2023   Yes [provider]  atorvastatin  (LIPITOR) 40 MG tablet Take 1 tablet (40 mg total) by mouth daily. 07/11/23  Yes End, Lonni, MD  brimonidine  (ALPHAGAN ) 0.2 % ophthalmic solution Place 1 drop into both eyes 2 (two) times daily. 07/12/23  Yes [provider]  calcium  carbonate (CALCIUM  600) 600 MG TABS tablet 1 tablet with food Orally once a day for bone health   Yes [provider]  Cholecalciferol  (VITAMIN D3) 125 MCG (5000 UT) TABS Take 5,000 Units by mouth daily.   Yes [provider]  ferrous sulfate 325 (65 FE) MG tablet Take 325 mg by mouth daily with breakfast.   Yes [provider]  fluticasone  (FLONASE ) 50 MCG/ACT nasal spray Place 2 sprays into both nostrils daily. 09/27/23  Yes Cobb, Comer GAILS,  NP  hydrALAZINE  (APRESOLINE ) 50 MG tablet Take 1 tablet (50 mg total) by mouth 3 (three) times daily  as needed (SBP >160). 07/30/23  Yes Von Bellis, MD  latanoprost  (XALATAN ) 0.005 % ophthalmic solution Place 1 drop into both eyes at bedtime.   Yes [provider]  levothyroxine  (SYNTHROID ) 100 MCG tablet TAKE 1 TABLET BY MOUTH 30  MINUTES BEFORE BREAKFAST 02/02/22  Yes Bertrum Charlie CROME, MD  losartan  (COZAAR ) 50 MG tablet Take 1.5 tablets (75 mg total) by mouth daily. 07/11/23 11/26/23 Yes End, Lonni, MD  Multiple Vitamin (MULTIVITAMIN WITH MINERALS) TABS tablet Take 1 tablet by mouth daily. Centrum Silver   Yes [provider]  Multiple Vitamins-Minerals (CENTRUM SILVER 50+WOMEN) TABS 1 tablet Orally once a day   Yes [provider]  omeprazole  (PRILOSEC) 40 MG capsule TAKE 1 CAPSULE BY MOUTH DAILY 02/02/22  Yes Bertrum Charlie CROME, MD  potassium chloride  (KLOR-CON ) 20 MEQ packet DISSOLVE 2 PACKETS IN 4 OZ WATER OR JUICE AND DRINK DAILY 07/11/23  Yes End, Lonni, MD  sertraline  (ZOLOFT ) 100 MG tablet Take 100 mg by mouth daily.   Yes [provider]  simethicone  (MYLICON) 80 MG chewable tablet Chew 80 mg by mouth every 6 (six) hours as needed for flatulence.   Yes [provider]  azithromycin  (ZITHROMAX ) 500 MG tablet 1 tablet Orally Once a day This medication was only one dose Patient not taking: Reported on 11/26/2023 09/10/23   [provider]  docusate sodium  (COLACE) 100 MG capsule Take 100 mg by mouth daily.    [provider]  metoprolol  tartrate (LOPRESSOR ) 25 MG tablet Take 0.5 tablets (12.5 mg total) by mouth as needed (Afib episodes with HR > 120bpm.). 06/01/23 09/08/23  Riddle, Suzann, NP   Recent Labs    11/26/23 [phone] 11/27/23 0525  WBC 11.5* 6.0  HGB 12.5 10.4*  HCT 39.6 31.7*  PLT 258 194  K 3.0* 4.0  CL 105 102  CO2 23 24  BUN 18 16  CREATININE 0.58 0.62  GLUCOSE 135* 106*  CALCIUM  9.0 8.5*  INR  --  1.1    DG Chest Portable 1 View Result Date: 11/26/2023 CLINICAL DATA:  Preop exam EXAM: PORTABLE CHEST 1 VIEW COMPARISON:  Chest x-ray performed Sep 27, 2023 FINDINGS: Interstitial airspace opacities are present. Heart mediastinum are not significantly changed. The left atrial occlusion device is present. IMPRESSION: 1. Interstitial opacities, possibly sequelae of pulmonary vascular congestion. Electronically Signed   By: Maude Naegeli M.D.   On: 11/26/2023 08:12   CT Head Wo Contrast Result Date: 11/26/2023 CLINICAL DATA:  Head and neck trauma during a fall. EXAM: CT HEAD WITHOUT CONTRAST CT CERVICAL SPINE WITHOUT CONTRAST TECHNIQUE: Multidetector CT imaging of the head and cervical spine was performed following the standard protocol without intravenous contrast. Multiplanar CT image reconstructions of the cervical spine were also generated. RADIATION DOSE REDUCTION: This exam was performed according to the departmental dose-optimization program which includes automated exposure control, adjustment of the mA and/or kV according to patient size and/or use of iterative reconstruction technique. COMPARISON:  Head CT 06/06/2023 FINDINGS: CT HEAD FINDINGS Brain: There is no evidence for acute hemorrhage, hydrocephalus, mass lesion, or abnormal extra-axial fluid collection. No definite CT evidence for acute infarction. Diffuse loss of parenchymal volume is consistent with atrophy. Patchy low attenuation in the deep hemispheric and periventricular white matter is nonspecific, but likely reflects chronic microvascular ischemic demyelination. Vascular: No hyperdense vessel or unexpected calcification. Skull: No evidence for fracture. No worrisome lytic or sclerotic lesion. Sinuses/Orbits: The visualized paranasal sinuses and mastoid air cells are clear. Visualized portions  of the globes and intraorbital fat are unremarkable. Other: None. CT CERVICAL SPINE FINDINGS Alignment: Straightening of normal cervical lordosis without  evidence for traumatic subluxation. Trace anterolisthesis of C3 on 4 is compatible with degenerative disease. Skull base and vertebrae: No acute fracture. No primary bone lesion or focal pathologic process. Soft tissues and spinal canal: No prevertebral fluid or swelling. No visible canal hematoma. Disc levels: Loss of disc height with endplate spurring noted C5-6 and C6-7. Upper chest: No acute findings. Other: None IMPRESSION: 1. No acute intracranial abnormality. 2. Atrophy with chronic small vessel white matter ischemic disease. 3. No cervical spine fracture or traumatic subluxation. Electronically Signed   By: Camellia Candle M.D.   On: 11/26/2023 07:38   CT Cervical Spine Wo Contrast Result Date: 11/26/2023 CLINICAL DATA:  Head and neck trauma during a fall. EXAM: CT HEAD WITHOUT CONTRAST CT CERVICAL SPINE WITHOUT CONTRAST TECHNIQUE: Multidetector CT imaging of the head and cervical spine was performed following the standard protocol without intravenous contrast. Multiplanar CT image reconstructions of the cervical spine were also generated. RADIATION DOSE REDUCTION: This exam was performed according to the departmental dose-optimization program which includes automated exposure control, adjustment of the mA and/or kV according to patient size and/or use of iterative reconstruction technique. COMPARISON:  Head CT 06/06/2023 FINDINGS: CT HEAD FINDINGS Brain: There is no evidence for acute hemorrhage, hydrocephalus, mass lesion, or abnormal extra-axial fluid collection. No definite CT evidence for acute infarction. Diffuse loss of parenchymal volume is consistent with atrophy. Patchy low attenuation in the deep hemispheric and periventricular white matter is nonspecific, but likely reflects chronic microvascular ischemic demyelination. Vascular: No hyperdense vessel or unexpected calcification. Skull: No evidence for fracture. No worrisome lytic or sclerotic lesion. Sinuses/Orbits: The visualized paranasal  sinuses and mastoid air cells are clear. Visualized portions of the globes and intraorbital fat are unremarkable. Other: None. CT CERVICAL SPINE FINDINGS Alignment: Straightening of normal cervical lordosis without evidence for traumatic subluxation. Trace anterolisthesis of C3 on 4 is compatible with degenerative disease. Skull base and vertebrae: No acute fracture. No primary bone lesion or focal pathologic process. Soft tissues and spinal canal: No prevertebral fluid or swelling. No visible canal hematoma. Disc levels: Loss of disc height with endplate spurring noted C5-6 and C6-7. Upper chest: No acute findings. Other: None IMPRESSION: 1. No acute intracranial abnormality. 2. Atrophy with chronic small vessel white matter ischemic disease. 3. No cervical spine fracture or traumatic subluxation. Electronically Signed   By: Camellia Candle M.D.   On: 11/26/2023 07:38   DG Wrist Complete Right Result Date: 11/26/2023 CLINICAL DATA:  Fall.  Pain. EXAM: RIGHT WRIST - COMPLETE 3+ VIEW COMPARISON:  None Available. FINDINGS: Status post ORIF for distal radius fracture. No hardware complication. Chronic posttraumatic deformity noted ulnar styloid. No evidence for an acute fracture or dislocation. Bones are diffusely demineralized. IMPRESSION: 1. Status post ORIF for distal radius fracture. No hardware complication. 2. No acute bony findings. Electronically Signed   By: Camellia Candle M.D.   On: 11/26/2023 07:31   DG Ankle 2 Views Right Result Date: 11/26/2023 CLINICAL DATA:  Mechanical fall.  Pain. EXAM: RIGHT ANKLE - 2 VIEW COMPARISON:  None Available. FINDINGS: There is no evidence of fracture, dislocation, or joint effusion. There is no evidence of arthropathy or other focal bone abnormality. Soft tissues are unremarkable. IMPRESSION: Negative. Electronically Signed   By: Camellia Candle M.D.   On: 11/26/2023 07:29   DG Knee 2 Views Right Result Date: 11/26/2023 CLINICAL  DATA:  Mechanical fall.  Pain.  EXAM: RIGHT  KNEE - 1-2 VIEW COMPARISON:  None Available. FINDINGS: Two views study shows no acute fracture or dislocation. Loss of joint space noted medial compartment with degenerative spurring in all 3 compartments. Calcification of the lateral meniscus evident. IMPRESSION: Tricompartmental degenerative changes without acute bony findings. Electronically Signed   By: Camellia Candle M.D.   On: 11/26/2023 07:29   DG Hip Unilat W or Wo Pelvis 2-3 Views Right Result Date: 11/26/2023 CLINICAL DATA:  Status post fall.  Pain. EXAM: DG HIP (WITH OR WITHOUT PELVIS) 2-3V RIGHT COMPARISON:  07/20/2023 FINDINGS: Interval left hip replacement. Acute comminuted fracture noted right superior pubic ramus with associated acute fracture of the inferior right pubic ramus. There is also an acute fracture in the right femoral neck with some valgus deformity. IMPRESSION: 1. Acute fractures of the right superior and inferior pubic rami. 2. Acute fracture in the right femoral neck with some valgus deformity. Electronically Signed   By: Camellia Candle M.D.   On: 11/26/2023 07:26     Positive ROS: All other systems have been reviewed and were otherwise negative with the exception of those mentioned in the HPI and as above.  Physical Exam: BP (!) 113/53   Pulse 71   Temp 97.9 F (36.6 C) (Temporal)   Resp 17   Ht 5' 3 (1.6 m)   Wt 56.7 kg   SpO2 93%   BMI 22.14 kg/m  General:  Alert, no acute distress Psychiatric:  Patient is competent for consent with normal mood and affect     Orthopedic Exam:  RLE: + DF/PF/EHL SILT grossly over foot Foot wwp +Log roll/axial load Leg shortened and externally rotated   Imaging:  As above: R displaced femoral neck fracture  Assessment/Plan: Sandra [name] is a 78 y.o. female with a R displaced femoral neck fracture   1. I discussed the various treatment options including both surgical and non-surgical management of the fracture with the patient and/or family (medical PoA). We  discussed the high risk of perioperative complications due to patient's age and other co-morbidities. After discussion of risks, benefits, and alternatives to surgery, the family and/or patient were in agreement to proceed with surgery. The goals of surgery would be to provide adequate pain relief and allow for mobilization. Plan for surgery is R hip hemiarthroplasty today, 11/27/2023. 2. NPO until OR 3. Hold anticoagulation in advance of OR   Earnestine Blanch   11/27/2023 1:35 PM

## 2023-11-27 NOTE — Anesthesia Preprocedure Evaluation (Addendum)
 Anesthesia Evaluation  Patient identified by MRN, date of birth, ID band Patient awake    Reviewed: Allergy & Precautions, NPO status , Patient's Chart, lab work & pertinent test results  History of Anesthesia Complications Negative for: history of anesthetic complications  Airway Mallampati: I   Neck ROM: Full    Dental no notable dental hx.    Pulmonary    Pulmonary exam normal breath sounds clear to auscultation       Cardiovascular hypertension, + CAD (nonobstructive) and +CHF (preserved EF)  Normal cardiovascular exam+ dysrhythmias (a fib s/p Watchman and cardioversion; no longer on anticoagulation)  Rhythm:Regular Rate:Normal  ECG 11/26/23:  Sinus rhythm Probable left atrial enlargement LVH with secondary repolarization abnormality Repol abnrm, severe global ischemia (LM/MVD) Prolonged QT interval  Echo 09/09/23:  1. Left ventricular ejection fraction, by estimation, is 65 to 70%. The left ventricle has normal function. The left ventricle has no regional wall motion abnormalities. There is mild left ventricular hypertrophy. Left ventricular diastolic parameters are indeterminate.   2. Right ventricular systolic function is normal. The right ventricular size is normal. There is moderately elevated pulmonary artery systolic pressure. The estimated right ventricular systolic pressure is 49.2 mmHg.   3. The mitral valve is degenerative. Mild to moderate mitral valve regurgitation. No evidence of mitral stenosis.   4. Tricuspid valve regurgitation is mild to moderate.   5. The aortic valve is tricuspid. There is mild thickening of the aortic valve. Aortic valve regurgitation is not visualized. Aortic valve sclerosis is present, with no evidence of aortic valve stenosis.   6. The inferior vena cava is normal in size with greater than 50% respiratory variability, suggesting right atrial pressure of 3 mmHg.    Neuro/Psych  Headaches  PSYCHIATRIC DISORDERS Anxiety Depression    HOH CVA (cerebellar hemorrhage 2021 with residual balance deficit)    GI/Hepatic ,GERD  ,,  Endo/Other  Hypothyroidism  Prediabetes   Renal/GU      Musculoskeletal  (+) Arthritis ,  Raynaud   Abdominal   Peds  Hematology Breast CA   Anesthesia Other Findings Cardiology note 09/25/23:  #) persis AFib #) atrial flutter #) amiodarone  monitoring Historically low burden of afib on 100mg  amiodarone  daily S/p DCCV 4/10 and maintaining sinus since Continue 100mg  amiodarone  daily Recent LFTs and thyroid  labs stable Continue 12.5mg  lopressor  PRN for sustained HR above 120   #) Hypercoag d/t parox afib #) s/p LAAO with Watchman device TEE shows watchman is well-seated without leak CHA2DS2-VASc Score = at least 5 [CHF History: 0, HTN History: 1, Diabetes History: 0, Stroke History: 0, Vascular Disease History: 1, Age Score: 2, Gender Score: 1].  Therefore, the patient's annual risk of stroke is 7.2 %.    Continue eliquis  until 5/10 at which point she will be 1 month post-DCCV, then resume 81mg  ASA daily   #) frequent falls #) bradycardia #) ILR in situ No significant pauses or severe bradycardic events   #) diastolic HF #) possible LVH cMRI planned later this week to assess LV dimensions, LGE   Reproductive/Obstetrics                             Anesthesia Physical Anesthesia Plan  ASA: 3  Anesthesia Plan: General   Post-op Pain Management:    Induction: Intravenous  PONV Risk Score and Plan: 3 and Ondansetron , Dexamethasone  and Treatment may vary due to age or medical condition  Airway Management Planned: Oral  ETT  Additional Equipment:   Intra-op Plan:   Post-operative Plan: Extubation in OR  Informed Consent: I have reviewed the patients History and Physical, chart, labs and discussed the procedure including the risks, benefits and alternatives for the proposed anesthesia with the patient or  authorized representative who has indicated his/her understanding and acceptance.     Dental advisory given  Plan Discussed with: CRNA  Anesthesia Plan Comments: (Patient's daughter at bedside for discussion.  Plan for GETA rather than spinal due to patient preference (too painful to move). Patient consented for risks of anesthesia including but not limited to:  - adverse reactions to medications - damage to eyes, teeth, lips or other oral mucosa - nerve damage due to positioning  - sore throat or hoarseness - damage to heart, brain, nerves, lungs, other parts of body or loss of life  Informed patient about role of CRNA in peri- and intra-operative care.  Patient voiced understanding.)        Anesthesia Quick Evaluation

## 2023-11-27 NOTE — Hospital Course (Addendum)
 Hospital course / significant events:   HPI: Sandra [name] is a 78 y.o. female with medical history significant for left hip fracture s/p repair in march of this year, presenting with trip and fall at home with right hip pain.   06/30: admitted for R femoral neck fracture, pubic rami fractures. Plan OR tomorrow 07/01  R hip hemiarthroplasty. Eliquis  held pending ortho recs.  07/02-07/09: pending placement 07/10-07/13: appealing SNF denial 07/14: should be good to go to [facility]  07/15: never got confirmation from [facility] about placement 7/16.  Patient did better with physical therapy and patient wants to go home.  Patient walked 300 feet.  Home health ordered.  Few pain pills prescribed.  Patient completed Lovenox  injections while here.  Nursing staff remove staples prior to discharge.   Consultants:  Orthopedic surgery   Procedures/Surgeries: 11/27/23 R hip hemiarthroplasty - Dr GORMAN Blanch      ASSESSMENT & PLAN:   Right femoral neck fracture Pubic rami fractures S/p mechanical fall at home. Also with right wrist pain, imaging of right knee, right ankle, and right wrist are negative S/p 11/27/23 R hip hemiarthroplasty   Orthopedic surgery have nursing staff to take out the staples prior to discharge. Pain control.  Few pain pills prescribed Bowel regimen PT/OT  Patient did better with physical therapy and walked 300 feet and wanted to go home.   A-fib S/p watchman device, cardioversion April of this year. Sinus rhythm currently continue home amiodarone , metoprolol  and aspirin .  Patient not on Eliquis .   Essential HTN  Hydralazine , losartan , metoprolol    GAD home xanax , sertraline    Hypothyroid home synthroid    Hypokalemia Replaced

## 2023-11-27 NOTE — Transfer of Care (Signed)
 Immediate Anesthesia Transfer of Care Note  Patient: Sandra [name]  Procedure(s) Performed: HEMIARTHROPLASTY (BIPOLAR) HIP, POSTERIOR APPROACH FOR FRACTURE (Right: Hip)  Patient Location: PACU  Anesthesia Type:General  Level of Consciousness: drowsy and patient cooperative  Airway & Oxygen Therapy: Patient Spontanous Breathing and Patient connected to face mask oxygen  Post-op Assessment: Report given to RN and Post -op Vital signs reviewed and stable  Post vital signs: stable  Last Vitals:  Vitals Value Taken Time  BP 130/55 11/27/23 16:00  Temp    Pulse 68 11/27/23 16:02  Resp 17 11/27/23 16:02  SpO2 99 % 11/27/23 16:02  Vitals shown include unfiled device data.  Last Pain:  Vitals:   11/27/23 1235  TempSrc: Temporal  PainSc: 7       Patients Stated Pain Goal: 3 (11/27/23 0536)  Complications: No notable events documented.

## 2023-11-27 NOTE — Anesthesia Procedure Notes (Signed)
 Procedure Name: Intubation Date/Time: 11/27/2023 2:14 PM  Performed by: Lennie Lamarr HERO, CRNAPre-anesthesia Checklist: Patient identified, Emergency Drugs available, Suction available and Patient being monitored Patient Re-evaluated:Patient Re-evaluated prior to induction Oxygen Delivery Method: Circle System Utilized Preoxygenation: Pre-oxygenation with 100% oxygen Induction Type: IV induction Ventilation: Mask ventilation without difficulty Laryngoscope Size: McGrath and 3 Grade View: Grade I Tube type: Oral Tube size: 7.0 mm Number of attempts: 1 Airway Equipment and Method: Stylet, Oral airway and Video-laryngoscopy Placement Confirmation: ETT inserted through vocal cords under direct vision, positive ETCO2 and breath sounds checked- equal and bilateral Secured at: 22 cm Tube secured with: Tape Dental Injury: Teeth and Oropharynx as per pre-operative assessment

## 2023-11-27 NOTE — Progress Notes (Signed)
 PROGRESS NOTE    Sandra [name]   FMW:[id number] DOB: 05-25-1946  DOA: 11/26/2023 Date of Service: 11/27/23 which is hospital day 1  PCP: Sampson Ethridge LABOR, MD    Hospital course / significant events:   HPI: Sandra [name] is a 78 y.o. female with medical history significant for left hip fracture s/p repair in march of this year, presenting with trip and fall at home with right hip pain.   06/30: admitted for R femoral neck fracture, pubic rami fractures. Plan OR tomorrow 07/01  R hip hemiarthroplasty. Eliquis  held pending ortho recs.      Consultants:  Orthopedic surgery   Procedures/Surgeries: 11/27/23 R hip hemiarthroplasty - Dr GORMAN Blanch      ASSESSMENT & PLAN:   Right femoral neck fracture Pubic rami fractures S/p mechanical fall at home. Also with right wrist pain, imaging of right knee, right ankle, and right wrist are negative Orthopedic surgery following --> repair today  Medically optimized for surgery  Pain control Bowel regimen PT/OT post-op, suspect will need rehab   A-fib S/p watchman device, cardioversion April of this year. Sinus rhythm currently cont home amiodarone  Takes metoprolol  prn afib / palpitations at home, will order here prn HR >120 Holding eliquis  peri-operaively, resume per ortho    Essential HTN  Hydralazine , losartan  Takes metoprolol  prn afib / palpitations at home, will order here prn HR >120  GAD home xanax , sertraline    Hypothyroid home synthroid    Hypokalemia Replace as needed Monitor BMP       No concerns based on BMI: Body mass index is 22.14 kg/m.[name] Significantly low or high BMI is associated with higher medical risk.  Underweight - under 18  overweight - 25 to 29 obese - 30 or more Class 1 obesity: BMI of 30.0 to 34 Class 2 obesity: BMI of 35.0 to 39 Class 3 obesity: BMI of 40.0 to 49 Super Morbid Obesity: BMI 50-59 Super-super Morbid Obesity: BMI 60+ Healthy nutrition and physical activity advised  as adjunct to other disease management and risk reduction treatments    DVT prophylaxis: holding home eliquis , can likely restart this per ortho  IV fluids: no continuous IV fluids  Nutrition: regular diet once out of OR Central lines / other devices: none  Code Status: FULL CODE ACP documentation reviewed:  none on file in [facility]  TOC needs: TBD likely will need SNF or [facility] Medical barriers to dispo: surgery today. Expected medical readiness for discharge 1-2 days .              Subjective / Brief ROS:  Patient reports she's hungry and wants to get surgery done soon but no other concerns at this time Denies CP/SOB.  Pain controlled.  Denies new weakness. .  Reports no concerns w/ urination/defecation.   Family Communication: family at bedside on rounds      Objective Findings:  Vitals:   11/27/23 0035 11/27/23 0409 11/27/23 0742 11/27/23 1235  BP:  (!) 108/55 128/68 (!) 113/53  Pulse:  68 71 71  Resp:  18 17 17   Temp:  98.6 F (37 C) 97.8 F (36.6 C) 97.9 F (36.6 C)  TempSrc:  Oral Oral Temporal  SpO2:  93% 94% 93%  Weight: 56.7 kg     Height: 5' 3 (1.6 m)       Intake/Output Summary (Last 24 hours) at 11/27/2023 1518 Last data filed at 11/27/2023 1432 Gross per 24 hour  Intake 200 ml  Output 1350 ml  Net -  1150 ml   Filed Weights   11/27/23 0035  Weight: 56.7 kg    Examination:  Physical Exam Constitutional:      Appearance: She is not toxic-appearing.   Cardiovascular:     Rate and Rhythm: Normal rate and regular rhythm.  Pulmonary:     Effort: Pulmonary effort is normal.     Breath sounds: Normal breath sounds.  Abdominal:     Palpations: Abdomen is soft.   Musculoskeletal:     Right lower leg: No edema.     Left lower leg: No edema.   Neurological:     General: No focal deficit present.     Mental Status: She is alert and oriented to person, place, and time.   Psychiatric:        Mood and Affect: Mood normal.        Behavior:  Behavior normal.          Scheduled Medications:   [MAR Hold] amiodarone   100 mg Oral Daily   [MAR Hold] atorvastatin   40 mg Oral Daily   [MAR Hold] brimonidine   1 drop Both Eyes BID   [MAR Hold] latanoprost   1 drop Both Eyes QHS   [MAR Hold] levothyroxine   100 mcg Oral Q0600   [MAR Hold] losartan   75 mg Oral Daily   [MAR Hold] pantoprazole   40 mg Oral Daily   [MAR Hold] polyethylene glycol  17 g Oral Daily   [MAR Hold] potassium chloride   40 mEq Oral Daily   [MAR Hold] senna  1 tablet Oral Daily   [MAR Hold] sertraline   100 mg Oral Daily    Continuous Infusions:    PRN Medications:  0.9 % irrigation (POUR BTL), [MAR Hold] ALPRAZolam , [MAR Hold] hydrALAZINE , [MAR Hold] methocarbamol , [MAR Hold] metoprolol  tartrate, [MAR Hold]  morphine  injection, [MAR Hold] oxyCODONE , sodium chloride  irrigation  Antimicrobials from admission:  Anti-infectives (From admission, onward)    Start     Dose/Rate Route Frequency Ordered Stop   11/27/23 1200  [MAR Hold]  ceFAZolin  (ANCEF ) IVPB 2g/100 mL premix        (MAR Hold since Tue 11/27/2023 at 1233.Hold Reason: Transfer to a Procedural area)   2 g 200 mL/hr over 30 Minutes Intravenous  Once 11/26/23 1258 11/27/23 1421           Data Reviewed:  I have personally reviewed the following...  CBC: Recent Labs  Lab 11/26/23 0641 11/27/23 0525  WBC 11.5* 6.0  NEUTROABS 10.4*  --   HGB 12.5 10.4*  HCT 39.6 31.7*  MCV 92.7 91.6  PLT 258 194   Basic Metabolic Panel: Recent Labs  Lab 11/26/23 0641 11/27/23 0525  NA 140 135  K 3.0* 4.0  CL 105 102  CO2 23 24  GLUCOSE 135* 106*  BUN 18 16  CREATININE 0.58 0.62  CALCIUM  9.0 8.5*  MG 2.0  --    GFR: Estimated Creatinine Clearance: 47.9 mL/min (by C-G formula based on SCr of 0.62 mg/dL). Liver Function Tests: No results for input(s): AST, ALT, ALKPHOS, BILITOT, PROT, ALBUMIN  in the last 168 hours. No results for input(s): LIPASE, AMYLASE in the last 168  hours. No results for input(s): AMMONIA in the last 168 hours. Coagulation Profile: Recent Labs  Lab 11/27/23 0525  INR 1.1   Cardiac Enzymes: No results for input(s): CKTOTAL, CKMB, CKMBINDEX, TROPONINI in the last 168 hours. BNP (last 3 results) No results for input(s): PROBNP in the last 8760 hours. HbA1C: No results for input(s): HGBA1C  in the last 72 hours. CBG: No results for input(s): GLUCAP in the last 168 hours. Lipid Profile: No results for input(s): CHOL, HDL, LDLCALC, TRIG, CHOLHDL, LDLDIRECT in the last 72 hours. Thyroid  Function Tests: No results for input(s): TSH, T4TOTAL, FREET4, T3FREE, THYROIDAB in the last 72 hours. Anemia Panel: No results for input(s): VITAMINB12, FOLATE, FERRITIN, TIBC, IRON , RETICCTPCT in the last 72 hours. Most Recent Urinalysis On File:     Component Value Date/Time   COLORURINE STRAW (A) 09/08/2023 1531   APPEARANCEUR CLEAR (A) 09/08/2023 1531   LABSPEC 1.018 09/08/2023 1531   PHURINE 7.0 09/08/2023 1531   GLUCOSEU NEGATIVE 09/08/2023 1531   HGBUR NEGATIVE 09/08/2023 1531   BILIRUBINUR NEGATIVE 09/08/2023 1531   BILIRUBINUR Negative 11/24/2019 1352   KETONESUR NEGATIVE 09/08/2023 1531   PROTEINUR NEGATIVE 09/08/2023 1531   UROBILINOGEN 0.2 11/24/2019 1352   NITRITE NEGATIVE 09/08/2023 1531   LEUKOCYTESUR NEGATIVE 09/08/2023 1531   Sepsis Labs: @LABRCNTIP (procalcitonin:4,lacticidven:4) Microbiology: Recent Results (from the past 240 hours)  Surgical PCR screen     Status: None   Collection Time: 11/26/23  9:49 PM   Specimen: Nasal Mucosa; Nasal Swab  Result Value Ref Range Status   MRSA, PCR NEGATIVE NEGATIVE Final   Staphylococcus aureus NEGATIVE NEGATIVE Final    Comment: (NOTE) The Xpert SA Assay (FDA approved for NASAL specimens in patients 74 years of age and older), is one component of a comprehensive surveillance program. It is not intended to diagnose infection nor  to guide or monitor treatment. Performed at [facility], [address] [place], [place] [address]       Radiology Studies last 3 days: DG Chest Portable 1 View Result Date: 11/26/2023 CLINICAL DATA:  Preop exam EXAM: PORTABLE CHEST 1 VIEW COMPARISON:  Chest x-ray performed Sep 27, 2023 FINDINGS: Interstitial airspace opacities are present. Heart mediastinum are not significantly changed. The left atrial occlusion device is present. IMPRESSION: 1. Interstitial opacities, possibly sequelae of pulmonary vascular congestion. Electronically Signed   By: Maude Naegeli M.D.   On: 11/26/2023 08:12   CT Head Wo Contrast Result Date: 11/26/2023 CLINICAL DATA:  Head and neck trauma during a fall. EXAM: CT HEAD WITHOUT CONTRAST CT CERVICAL SPINE WITHOUT CONTRAST TECHNIQUE: Multidetector CT imaging of the head and cervical spine was performed following the standard protocol without intravenous contrast. Multiplanar CT image reconstructions of the cervical spine were also generated. RADIATION DOSE REDUCTION: This exam was performed according to the departmental dose-optimization program which includes automated exposure control, adjustment of the mA and/or kV according to patient size and/or use of iterative reconstruction technique. COMPARISON:  Head CT 06/06/2023 FINDINGS: CT HEAD FINDINGS Brain: There is no evidence for acute hemorrhage, hydrocephalus, mass lesion, or abnormal extra-axial fluid collection. No definite CT evidence for acute infarction. Diffuse loss of parenchymal volume is consistent with atrophy. Patchy low attenuation in the deep hemispheric and periventricular white matter is nonspecific, but likely reflects chronic microvascular ischemic demyelination. Vascular: No hyperdense vessel or unexpected calcification. Skull: No evidence for fracture. No worrisome lytic or sclerotic lesion. Sinuses/Orbits: The visualized paranasal sinuses and mastoid air cells are clear. Visualized portions  of the globes and intraorbital fat are unremarkable. Other: None. CT CERVICAL SPINE FINDINGS Alignment: Straightening of normal cervical lordosis without evidence for traumatic subluxation. Trace anterolisthesis of C3 on 4 is compatible with degenerative disease. Skull base and vertebrae: No acute fracture. No primary bone lesion or focal pathologic process. Soft tissues and spinal canal: No prevertebral fluid or swelling.  No visible canal hematoma. Disc levels: Loss of disc height with endplate spurring noted C5-6 and C6-7. Upper chest: No acute findings. Other: None IMPRESSION: 1. No acute intracranial abnormality. 2. Atrophy with chronic small vessel white matter ischemic disease. 3. No cervical spine fracture or traumatic subluxation. Electronically Signed   By: Camellia Candle M.D.   On: 11/26/2023 07:38   CT Cervical Spine Wo Contrast Result Date: 11/26/2023 CLINICAL DATA:  Head and neck trauma during a fall. EXAM: CT HEAD WITHOUT CONTRAST CT CERVICAL SPINE WITHOUT CONTRAST TECHNIQUE: Multidetector CT imaging of the head and cervical spine was performed following the standard protocol without intravenous contrast. Multiplanar CT image reconstructions of the cervical spine were also generated. RADIATION DOSE REDUCTION: This exam was performed according to the departmental dose-optimization program which includes automated exposure control, adjustment of the mA and/or kV according to patient size and/or use of iterative reconstruction technique. COMPARISON:  Head CT 06/06/2023 FINDINGS: CT HEAD FINDINGS Brain: There is no evidence for acute hemorrhage, hydrocephalus, mass lesion, or abnormal extra-axial fluid collection. No definite CT evidence for acute infarction. Diffuse loss of parenchymal volume is consistent with atrophy. Patchy low attenuation in the deep hemispheric and periventricular white matter is nonspecific, but likely reflects chronic microvascular ischemic demyelination. Vascular: No hyperdense  vessel or unexpected calcification. Skull: No evidence for fracture. No worrisome lytic or sclerotic lesion. Sinuses/Orbits: The visualized paranasal sinuses and mastoid air cells are clear. Visualized portions of the globes and intraorbital fat are unremarkable. Other: None. CT CERVICAL SPINE FINDINGS Alignment: Straightening of normal cervical lordosis without evidence for traumatic subluxation. Trace anterolisthesis of C3 on 4 is compatible with degenerative disease. Skull base and vertebrae: No acute fracture. No primary bone lesion or focal pathologic process. Soft tissues and spinal canal: No prevertebral fluid or swelling. No visible canal hematoma. Disc levels: Loss of disc height with endplate spurring noted C5-6 and C6-7. Upper chest: No acute findings. Other: None IMPRESSION: 1. No acute intracranial abnormality. 2. Atrophy with chronic small vessel white matter ischemic disease. 3. No cervical spine fracture or traumatic subluxation. Electronically Signed   By: Camellia Candle M.D.   On: 11/26/2023 07:38   DG Wrist Complete Right Result Date: 11/26/2023 CLINICAL DATA:  Fall.  Pain. EXAM: RIGHT WRIST - COMPLETE 3+ VIEW COMPARISON:  None Available. FINDINGS: Status post ORIF for distal radius fracture. No hardware complication. Chronic posttraumatic deformity noted ulnar styloid. No evidence for an acute fracture or dislocation. Bones are diffusely demineralized. IMPRESSION: 1. Status post ORIF for distal radius fracture. No hardware complication. 2. No acute bony findings. Electronically Signed   By: Camellia Candle M.D.   On: 11/26/2023 07:31   DG Ankle 2 Views Right Result Date: 11/26/2023 CLINICAL DATA:  Mechanical fall.  Pain. EXAM: RIGHT ANKLE - 2 VIEW COMPARISON:  None Available. FINDINGS: There is no evidence of fracture, dislocation, or joint effusion. There is no evidence of arthropathy or other focal bone abnormality. Soft tissues are unremarkable. IMPRESSION: Negative. Electronically Signed    By: Camellia Candle M.D.   On: 11/26/2023 07:29   DG Knee 2 Views Right Result Date: 11/26/2023 CLINICAL DATA:  Mechanical fall.  Pain.  EXAM: RIGHT KNEE - 1-2 VIEW COMPARISON:  None Available. FINDINGS: Two views study shows no acute fracture or dislocation. Loss of joint space noted medial compartment with degenerative spurring in all 3 compartments. Calcification of the lateral meniscus evident. IMPRESSION: Tricompartmental degenerative changes without acute bony findings. Electronically Signed   By: Camellia  Minus M.D.   On: 11/26/2023 07:29   DG Hip Unilat W or Wo Pelvis 2-3 Views Right Result Date: 11/26/2023 CLINICAL DATA:  Status post fall.  Pain. EXAM: DG HIP (WITH OR WITHOUT PELVIS) 2-3V RIGHT COMPARISON:  07/20/2023 FINDINGS: Interval left hip replacement. Acute comminuted fracture noted right superior pubic ramus with associated acute fracture of the inferior right pubic ramus. There is also an acute fracture in the right femoral neck with some valgus deformity. IMPRESSION: 1. Acute fractures of the right superior and inferior pubic rami. 2. Acute fracture in the right femoral neck with some valgus deformity. Electronically Signed   By: Camellia Minus M.D.   On: 11/26/2023 07:26         Marquinn Meschke, DO [facility] 11/27/2023, 3:18 PM    Dictation software may have been used to generate the above note. Typos may occur and escape review in typed/dictated notes. Please contact Dr Marsa directly for clarity if needed.  Staff may message me via secure chat in Epic  but this may not receive an immediate response,  please page me for urgent matters!  If 7PM-7AM, please contact night coverage [email or web address]

## 2023-11-27 NOTE — H&P (Signed)
 H&P reviewed. No significant changes noted.

## 2023-11-27 NOTE — Op Note (Addendum)
 DATE OF SURGERY: 11/27/2023  PREOPERATIVE DIAGNOSIS:  Right femoral neck fracture Right superior and inferior pubic rami fractures  POSTOPERATIVE DIAGNOSIS:  Right femoral neck fracture 2.   Right superior and inferior pubic rami fractures  PROCEDURE:  Right hip hemiarthroplasty Right closed management of superior and inferior pubic rami fractures  SURGEON: Earnestine HILARIO Blanch, MD  ASSISTANT: DOROTHA Krystal Doyne, PA  EBL: 150 cc  COMPONENTS:  [employer] - Accolade C Size 4 Stem [employer]ker - Unitrax 45mm head with neutral neck   INDICATIONS: LINEA CALLES is a 78 y.o. female who sustained a femoral neck fracture after a fall. Risks and benefits of hip hemiarthroplasty were explained to the patient and/or family. Risks include but are not limited to bleeding, infection, injury to tissues, nerves, vessels, periprosthetic infection, dislocation, limb length discrepancy and risks of anesthesia. The patient and/or family understands these risks, has completed an informed consent and wishes to proceed.   PROCEDURE:  The patient was identified in the preoperative holding area and the operative extremity was marked.  The patient was then transferred to the operating room suite and mobilized from the hospital gurney to the operating room table. Spinal anesthesia was administered without complication. The patient was then transitioned to a lateral position.  All bony prominences were padded per protocol.  An axillary roll was placed.  Careful attention was paid to the contralateral side peroneal nerve, which was free from pressure with use of appropriate padding and blankets. A time-out was performed to confirm the patient's identity and the correct laterality of surgery. The patient was then prepped and draped in the usual sterile fashion. Appropriate pre-operative antibiotics were administered. Tranexamic acid  was administered preoperatively.    An incision that centered on the posterior tip of the greater  trochanter with a posterior curve was made. Dissection was carried down through the subcutaneous tissue.  Careful attention was made to maintain hemostasis using electrocautery.  Dissection brought us  to the level of the deep fascia where the gluteus maximus muscle and proximal portion of the IT band were identified.  The proximal region of the IT band was incised in linear fashion and this incision was extended proximally in a curvilinear fashion to split the gluteus maximus muscle parallel to its fibers to minimize bleeding.  This was accomplished using a combination bovie electrocautery as well as blunt dissection.  The trochanteric bursa was then visualized and dissected from anterior to posterior. A blunt homan retractor was placed beneath the abductors. The piriformis tendon and short external rotators were visualized. Bovie electrocautery was used to cut these with the capsule as one L-shaped flap. This was tagged at the corner with #5 Ethibond. At this point, the femoral neck fracture was visualized. An oscillating saw was used to make a new neck cut approximately 15mm above the lesser tuberosity with the use of a neck cut guide. The head was then freed from its remaining soft tissue attachments and measured. The head trial was then inserted into the acetabulum and sized appropriately.    We then turned our attention to preparing the femoral canal. First, a box cut was performed utilizing the box osteotome. A canal finder was inserted by hand and sequential broaching was then performed. The calcar planer was inserted onto the broach and used to smooth the calcar appropriately.  A trial stem, neutral neck, and head were inserted into the acetabulum and placed through range of motion. Intraoperative radiographs were taken to assess hardware position and leg lengths. The  hip was again dislocated and the femoral trial components were removed. An appropriately sized cement restrictor was placed. The canal was  irrigated with pulse lavage and dried. A cement gun was used to place cement into the femoral canal. Cement was then pressurized. The actual stem was inserted into the femoral canal and then driven onto the calcar. Position was held until the cement hardened. The trial head was then again inserted on the femoral component and found to be appropriate. Trial was removed and the permanent head/neck was Morse tapered onto the femoral stem and then reduced into the acetabulum.    The hip stability and length were reassessed and found to be satisfactory.  The wound was then copiously irrigated with normal saline solution. The tagged sutures of the capsule and piriformis were sewn to the gluteus medius tendon. This adequately closed the hip capsule. The IT band and gluteus maximus fascia were then closed with 0-Vicryl in a running, locked fashion. A mixture of Exparel  and bupivicaine was administered.  The subdermal layer was closed with 2-0 Vicryl in a buried interrupted fashion. Skin was approximated with staples.  Sterile dressing was applied. A knee immobilizer was placed. The patient was mobilized from the lateral position back to supine on the operating room table and then awakened from general anesthesia without complication.   POSTOPERATIVE PLAN: The patient will be WBAT on operative extremity. Lovenox  40mg /day x 4 weeks to start on POD#1. IV Abx x 24 hours. PT/OT on POD#1. Posterior hip precautions.

## 2023-11-27 NOTE — Plan of Care (Signed)
  Problem: Education: Goal: Knowledge of General Education information will improve Description: Including pain rating scale, medication(s)/side effects and non-pharmacologic comfort measures Outcome: Progressing   Problem: Nutrition: Goal: Adequate nutrition will be maintained Outcome: Progressing   Problem: Coping: Goal: Level of anxiety will decrease Outcome: Progressing   Problem: Pain Managment: Goal: General experience of comfort will improve and/or be controlled Outcome: Progressing   Problem: Safety: Goal: Ability to remain free from injury will improve Outcome: Progressing   Problem: Skin Integrity: Goal: Risk for impaired skin integrity will decrease Outcome: Progressing

## 2023-11-28 ENCOUNTER — Encounter: Payer: Self-pay | Admitting: Orthopedic Surgery

## 2023-11-28 DIAGNOSIS — S72009A Fracture of unspecified part of neck of unspecified femur, initial encounter for closed fracture: Secondary | ICD-10-CM | POA: Diagnosis not present

## 2023-11-28 LAB — CBC
HCT: 29.3 % — ABNORMAL LOW (ref 36.0–46.0)
Hemoglobin: 9.4 g/dL — ABNORMAL LOW (ref 12.0–15.0)
MCH: 29.3 pg (ref 26.0–34.0)
MCHC: 32.1 g/dL (ref 30.0–36.0)
MCV: 91.3 fL (ref 80.0–100.0)
Platelets: 184 10*3/uL (ref 150–400)
RBC: 3.21 MIL/uL — ABNORMAL LOW (ref 3.87–5.11)
RDW: 12.9 % (ref 11.5–15.5)
WBC: 8.4 10*3/uL (ref 4.0–10.5)
nRBC: 0 % (ref 0.0–0.2)

## 2023-11-28 LAB — BASIC METABOLIC PANEL WITH GFR
Anion gap: 7 (ref 5–15)
BUN: 19 mg/dL (ref 8–23)
CO2: 22 mmol/L (ref 22–32)
Calcium: 8.4 mg/dL — ABNORMAL LOW (ref 8.9–10.3)
Chloride: 106 mmol/L (ref 98–111)
Creatinine, Ser: 0.67 mg/dL (ref 0.44–1.00)
GFR, Estimated: >60 mL/min (ref >60)
Glucose, Bld: 182 mg/dL — ABNORMAL HIGH (ref 70–99)
Potassium: 4.7 mmol/L (ref 3.5–5.1)
Sodium: 135 mmol/L (ref 135–145)

## 2023-11-28 MED ORDER — TRAMADOL HCL 50 MG PO TABS: 50.0000 mg | ORAL_TABLET | Freq: Four times a day (QID) | ORAL | 0 refills | Status: DC | PRN

## 2023-11-28 MED ORDER — ENOXAPARIN SODIUM 40 MG/0.4ML IJ SOSY: 40.0000 mg | PREFILLED_SYRINGE | INTRAMUSCULAR | 0 refills | Status: DC

## 2023-11-28 MED ORDER — OXYCODONE HCL 5 MG PO TABS: 5.0000 mg | ORAL_TABLET | Freq: Four times a day (QID) | ORAL | 0 refills | Status: DC | PRN

## 2023-11-28 NOTE — Plan of Care (Signed)
  Problem: Activity: Goal: Risk for activity intolerance will decrease 11/28/2023 2320 by Bonny Belvie PARAS, RN Outcome: Not Progressing   Problem: Elimination: Goal: Will not experience complications related to bowel motility 11/28/2023 2320 by Bonny Belvie PARAS, RN Outcome: Not Progressing   Problem: Pain Managment: Goal: General experience of comfort will improve and/or be controlled 11/28/2023 2320 by Bonny Belvie PARAS, RN Outcome: Not Progressing

## 2023-11-28 NOTE — Evaluation (Signed)
 Occupational Therapy Evaluation Patient Details Name: Sandra [name] MRN: [id number] DOB: April 02, 1946 Today's Date: 11/28/2023   History of Present Illness   Pt is a 78 year old who presented with a femoral neck fracture after a fall s/p R hemiarthroplasty, posterior approach 11/27/23    PMH significant for previous left hip replacement, paroxysmal atrial fibrillation not on anticoagulation, heart failure, previous stroke     Clinical Impressions Chart reviewed, pt greeted in bed, agreeable to OT evaluation. Daughter is present throughout. PTA pt reports she is MOD I in ADL, amb with SPC but has not been using AD in the house. Multiple falls reported. Pt presents with deficits in strength, endurance, activity tolerance, balance, affecting safe and optimal ADL completion. Please see further details below. She requires physical assist for bed mobility, STS with MIN A+2 with RW, step pivot transfer to bedside chair with MIN A +2. Step by step multi modal cues throughout. Pt endorses dizziness upon standing, however resolved with standing rest break. Pt will benefit from acute OT to address functional deficits and facilitate return to PLOF. Pt is left in bedside chair, all needs met. OT will continue to follow.      If plan is discharge home, recommend the following:   A lot of help with walking and/or transfers;A lot of help with bathing/dressing/bathroom;Help with stairs or ramp for entrance;Assistance with cooking/housework     Functional Status Assessment   Patient has had a recent decline in their functional status and demonstrates the ability to make significant improvements in function in a reasonable and predictable amount of time.     Equipment Recommendations   Other (comment) (defer to next venue of care)     Recommendations for Other Services         Precautions/Restrictions   Precautions Precautions: Posterior Hip;Fall Precaution Booklet Issued: Yes (comment) Recall  of Precautions/Restrictions: Intact Required Braces or Orthoses: Knee Immobilizer - Right Knee Immobilizer - Right: On except when in CPM (or when working with PT) Restrictions Weight Bearing Restrictions Per Provider Order: Yes RLE Weight Bearing Per Provider Order: Weight bearing as tolerated     Mobility Bed Mobility Overal bed mobility: Needs Assistance Bed Mobility: Supine to Sit     Supine to sit: +2 for physical assistance, Used rails, HOB elevated, Max assist     General bed mobility comments: initial MIN A +2, MAX A +2 to complete supine>sit wtih step by step cueing    Transfers Overall transfer level: Needs assistance Equipment used: Rolling walker (2 wheels) Transfers: Sit to/from Stand Sit to Stand: Min assist, +2 physical assistance           General transfer comment: step by step multi modal cues for technique      Balance Overall balance assessment: Needs assistance Sitting-balance support: Bilateral upper extremity supported, Feet supported Sitting balance-Leahy Scale: Fair     Standing balance support: Bilateral upper extremity supported Standing balance-Leahy Scale: Fair                             ADL either performed or assessed with clinical judgement   ADL Overall ADL's : Needs assistance/impaired Eating/Feeding: Set up;Sitting   Grooming: Set up;Wash/dry face;Sitting               Lower Body Dressing: Maximal assistance;Sitting/lateral leans   Toilet Transfer: Minimal assistance;Rolling walker (2 wheels);+2 for safety/equipment;+2 for physical assistance Toilet Transfer Details (indicate cue type and  reason): frequent cueing for technique, simulated to bedside chair Toileting- Clothing Manipulation and Hygiene: Maximal assistance Toileting - Clothing Manipulation Details (indicate cue type and reason): anticipate             Vision Patient Visual Report: No change from baseline       Perception          Praxis         Pertinent Vitals/Pain Pain Assessment Pain Assessment: 0-10 Pain Score: 2  Pain Location: R hip Pain Descriptors / Indicators: Aching, Discomfort, Grimacing, Guarding Pain Intervention(s): Repositioned, Monitored during session, Limited activity within patient's tolerance     Extremity/Trunk Assessment Upper Extremity Assessment Upper Extremity Assessment: Generalized weakness   Lower Extremity Assessment Lower Extremity Assessment: Defer to PT evaluation RLE Deficits / Details: R knee immobilizer, range decreased by at least 50% RLE: Unable to fully assess due to immobilization   Cervical / Trunk Assessment Cervical / Trunk Assessment: Kyphotic   Communication Communication Communication: Impaired Factors Affecting Communication: Hearing impaired   Cognition Arousal: Alert Behavior During Therapy: WFL for tasks assessed/performed Cognition: Cognition impaired           Executive functioning impairment (select all impairments): Problem solving OT - Cognition Comments: ?understanding of current level of function                 Following commands: Intact       Cueing  General Comments   Cueing Techniques: Verbal cues;Tactile cues;Gestural cues      Exercises Other Exercises Other Exercises: edu re: role of OT, role of rehab, discharge recommendations   Shoulder Instructions      Home Living Family/patient expects to be discharged to:: Private residence Living Arrangements: Spouse/significant other Available Help at Discharge: Family (daugther/son live out of town, have people in town to help PRN) Type of Home: House Home Access: Stairs to enter Secretary/administrator of Steps: 2+1 Entrance Stairs-Rails: Can reach both Home Layout: Two level;Able to live on main level with bedroom/bathroom Alternate Level Stairs-Number of Steps: 14 Alternate Level Stairs-Rails: Right Bathroom Shower/Tub: Producer, television/film/video:  Handicapped height Bathroom Accessibility: No   Home Equipment: Rollator (4 wheels);BSC/3in1;Rolling Environmental consultant (2 wheels)   Additional Comments: Daughter is visiting but lives in [facility]; assist from granddaughter who lives nearby PRN      Prior Functioning/Environment Prior Level of Function : Independent/Modified Independent;History of Falls (last six months)             Mobility Comments: pt reports she was amb with cane or no AD at home; multiple falls in the last few months ADLs Comments: pt reports MOD I with ADL/IADL    OT Problem List: Impaired balance (sitting and/or standing);Decreased safety awareness;Decreased cognition;Decreased strength;Decreased knowledge of use of DME or AE   OT Treatment/Interventions: Self-care/ADL training;Balance training;Modalities;Therapeutic exercise;Therapeutic activities;Patient/family education;DME and/or AE instruction      OT Goals(Current goals can be found in the care plan section)   Acute Rehab OT Goals Patient Stated Goal: improve function OT Goal Formulation: With patient Time For Goal Achievement: 12/12/23 Potential to Achieve Goals: Good ADL Goals Pt Will Perform Grooming: with modified independence;sitting;standing Pt Will Perform Lower Body Dressing: with modified independence;sitting/lateral leans;sit to/from stand Pt Will Transfer to Toilet: with modified independence;ambulating Pt Will Perform Toileting - Clothing Manipulation and hygiene: with modified independence;sitting/lateral leans;sit to/from stand   OT Frequency:  Min 2X/week    Co-evaluation PT/OT/SLP Co-Evaluation/Treatment: Yes Reason for Co-Treatment: To address functional/ADL transfers PT  goals addressed during session: Mobility/safety with mobility;Balance;Proper use of DME;Strengthening/ROM OT goals addressed during session: ADL's and self-care      AM-PAC OT 6 Clicks Daily Activity     Outcome Measure Help from another person eating meals?:  None Help from another person taking care of personal grooming?: None Help from another person toileting, which includes using toliet, bedpan, or urinal?: A Lot Help from another person bathing (including washing, rinsing, drying)?: A Lot Help from another person to put on and taking off regular upper body clothing?: None Help from another person to put on and taking off regular lower body clothing?: A Lot 6 Click Score: 18   End of Session Equipment Utilized During Treatment: Rolling walker (2 wheels) Nurse Communication: Mobility status  Activity Tolerance: Patient tolerated treatment well Patient left: in chair;with call bell/phone within reach;with chair alarm set  OT Visit Diagnosis: Other abnormalities of gait and mobility (R26.89)                Time: 9061-8995 OT Time Calculation (min): 26 min Charges:  OT General Charges $OT Visit: 1 Visit OT Evaluation $OT Eval Moderate Complexity: 1 Mod  Therisa Sheffield, OTD OTR/L  11/28/23, 11:33 AM

## 2023-11-28 NOTE — Progress Notes (Signed)
 Subjective: 1 Day Post-Op Procedure(s) (LRB): HEMIARTHROPLASTY (BIPOLAR) HIP, POSTERIOR APPROACH FOR FRACTURE (Right) Patient reports pain as mild.   Patient is well, and has had no acute complaints or problems Plan is to go Home versus rehab after hospital stay. Negative for chest pain and shortness of breath Fever: no Gastrointestinal: Negative for nausea and vomiting  Objective: Vital signs in last 24 hours: Temp:  [97.6 F (36.4 C)-99 F (37.2 C)] 97.8 F (36.6 C) (07/02 0325) Pulse Rate:  [66-72] 66 (07/02 0325) Resp:  [15-20] 18 (07/02 0325) BP: (113-133)/(53-68) 127/59 (07/02 0325) SpO2:  [92 %-99 %] 93 % (07/02 0330)  Intake/Output from previous day:  Intake/Output Summary (Last 24 hours) at 11/28/2023 0726 Last data filed at 11/28/2023 0400 Gross per 24 hour  Intake 900 ml  Output 1000 ml  Net -100 ml    Intake/Output this shift: No intake/output data recorded.  Labs: Recent Labs    11/26/23 0641 11/27/23 0525 11/28/23 0448  HGB 12.5 10.4* 9.4*   Recent Labs    11/27/23 0525 11/28/23 0448  WBC 6.0 8.4  RBC 3.46* 3.21*  HCT 31.7* 29.3*  PLT 194 184   Recent Labs    11/27/23 0525 11/28/23 0448  NA 135 135  K 4.0 4.7  CL 102 106  CO2 24 22  BUN 16 19  CREATININE 0.62 0.67  GLUCOSE 106* 182*  CALCIUM  8.5* 8.4*   Recent Labs    11/27/23 0525  INR 1.1     EXAM General - Patient is Alert and Oriented Extremity - Neurovascular intact Sensation intact distally Dorsiflexion/Plantar flexion intact Compartment soft Dressing/Incision - clean, dry, no drainage Motor Function - intact, moving foot and toes well on exam.   Past Medical History:  Diagnosis Date   (HFpEF) heart failure with preserved ejection fraction (HCC)    a. 08/2019 Echo: EF 60-65%, no rwma, mild LVH, Gr2 DD, nl RV fxn. Nl pASP. Mildly dil LA. Mild to mod MR.   Anemia    Anxiety    Arthritis    BRCA gene mutation negative 10/2016   NEGATIVE: [facility]   Breast cancer  (HCC) 10/16/2016   T1c,N0;, GRADE I/III, 1.6cm. ER/PR pos  HER2 not over expressed, Right Upper Outer   Celiac disease    Celiac syndrome    Cerebellar hemorrhage, acute (HCC) 07/17/2020   CHF (congestive heart failure) (HCC)    Depression    Dyspnea    WITH EXERTION   Fatty liver    GERD (gastroesophageal reflux disease)    OCC   Glaucoma    right eye   Headache    H/O MIGRAINES AS TEENAGER.   Heart murmur    a. 08/2019 Echo: mild to mod MR.   Hypertension    Hypothyroidism    Non-obstructive CAD (coronary artery disease)    a. 01/2017 MV: Hypertensive response. No ischemia/infarct. EF >65%; b. 09/2019 Cor CTA: Ca2+ = 9.29 (36th percentile). LAD calcified plaque (0-24%), otw nl. Multipel bilat pulm nodules up to 7mm.   Osteoporosis    PAF (paroxysmal atrial fibrillation) (HCC)    a. 02/2020 [facility]: predominantly RSR, 65 (50-105), rare PACs/PVCs, 8 beats NSVT, multiple episodes of PAF lasting up to 1hr . Avg AF rate 130 (93-170). AF burden <1%. Triggered events = RSR, PACs, and PAF; b. CHA2DS2VASc = 6.   Pneumonia    YEARS AGO   Pre-diabetes    Presence of Watchman left atrial appendage closure device 01/2021   Pulmonary nodules  a. 09/2019 Cor CTA: incidental finding of multiple bilat pulm nodules; b. 12/2019 High Res CT: stable, scattered solid pulm nodules.   Raynaud's disease    RLS (restless legs syndrome)    Stroke (HCC) 06/2020   mild balance issues   Wears hearing aid in both ears     Assessment/Plan: 1 Day Post-Op Procedure(s) (LRB): HEMIARTHROPLASTY (BIPOLAR) HIP, POSTERIOR APPROACH FOR FRACTURE (Right) Principal Problem:   Femoral neck fracture (HCC) Active Problems:   Invasive ductal carcinoma of breast, female, right (HCC)   NAFLD (nonalcoholic fatty liver disease)   Chronic diastolic CHF (congestive heart failure) (HCC)   Atrial fibrillation S/p cardioversion 09/06/23 , prior watchman's device placement   Non-obstructive CAD (coronary artery  disease)  Estimated body mass index is 22.14 kg/m as calculated from the following:   Height as of this encounter: 5' 3 (1.6 m).   Weight as of this encounter: 56.7 kg. Advance diet Up with therapy  Discharge planning: Plan to follow-up [facility]oped[facility] in 2 weeks for staple removal and x-rays of the right hip.  Physical therapy at home versus rehab.  DVT Prophylaxis - Lovenox  and TED hose Weight-Bearing as tolerated to right leg  Krystal Doyne, PA-C Orthopaedic Surgery 11/28/2023, 7:26 AM

## 2023-11-28 NOTE — Evaluation (Signed)
 Physical Therapy Evaluation Patient Details Name: Sandra [name] MRN: [id number] DOB: 1945-10-23 Today's Date: 11/28/2023  History of Present Illness  Sandra [name] is a 78 y.o. female s/p femoral neck fx.  Clinical Impression  Pt is a pleasant 78 year old female who was admitted for R femoral neck fx. Pt performs bed mobility with min a x2, requiring increased time and multiple verbal cueing, along with tactile cueing on manipulation of R LE. STS transfer performed with education on self-propelling to safely ascend bed, increased time needed to complete, pt involvement limited this date d/t pain in R LE. Ambulation x3' with RW towards recliner this date with CGA x2, no LOB experienced. Pt demonstrates deficits with strength, ROM, and balance which are impacting her current QoL. Pt will benefit from skilled PT interventions to meet therapy goals. PT to follow acutely as appropriate.          If plan is discharge home, recommend the following: A lot of help with walking and/or transfers;A lot of help with bathing/dressing/bathroom;Assist for transportation;Help with stairs or ramp for entrance   Can travel by private vehicle   No    Equipment Recommendations None recommended by PT  Recommendations for Other Services       Functional Status Assessment Patient has had a recent decline in their functional status and demonstrates the ability to make significant improvements in function in a reasonable and predictable amount of time.     Precautions / Restrictions Precautions Precautions: Posterior Hip;Fall Precaution Booklet Issued: No Recall of Precautions/Restrictions: Intact Precaution/Restrictions Comments: review of posterior hip precautions Required Braces or Orthoses: Knee Immobilizer - Right Knee Immobilizer - Right: On except when in CPM Restrictions Weight Bearing Restrictions Per Provider Order: Yes RLE Weight Bearing Per Provider Order: Weight bearing as tolerated       Mobility  Bed Mobility Overal bed mobility: Needs Assistance Bed Mobility: Supine to Sit     Supine to sit: Min assist, +2 for physical assistance, Used rails, HOB elevated     General bed mobility comments: min a x2 with R LE manipulation and verbal cueing, use of rails for UE    Transfers Overall transfer level: Needs assistance Equipment used: Rolling walker (2 wheels) Transfers: Sit to/from Stand Sit to Stand: Min assist, +2 physical assistance           General transfer comment: min a x2, heavy verbal cueing on manipulation of RW, tactile cueing on R LE placement, pt contribution this date limited by pain in R hip    Ambulation/Gait Ambulation/Gait assistance: Contact guard assist, +2 physical assistance Gait Distance (Feet): 3 Feet Assistive device: Rolling walker (2 wheels) Gait Pattern/deviations: Step-to pattern, Decreased step length - right, Decreased step length - left, Decreased stance time - right       General Gait Details: side steps to recliner  Stairs            Wheelchair Mobility     Tilt Bed    Modified Rankin (Stroke Patients Only)       Balance Overall balance assessment: Needs assistance Sitting-balance support: Bilateral upper extremity supported, Feet supported Sitting balance-Leahy Scale: Fair Sitting balance - Comments: sitting EOB Postural control: Posterior lean Standing balance support: Bilateral upper extremity supported Standing balance-Leahy Scale: Fair Standing balance comment: use of RW                             Pertinent Vitals/Pain  Pain Assessment Pain Assessment: 0-10 Pain Score: 2  Pain Location: R hip Pain Descriptors / Indicators: Aching, Discomfort, Grimacing, Guarding Pain Intervention(s): Monitored during session, Repositioned, Ice applied    Home Living Family/patient expects to be discharged to:: Private residence Living Arrangements: Spouse/significant other Available Help at  Discharge: Family Type of Home: House Home Access: Stairs to enter Entrance Stairs-Rails: Can reach both Entrance Stairs-Number of Steps: 2+1 Alternate Level Stairs-Number of Steps: 14 Home Layout: Two level;Able to live on main level with bedroom/bathroom Home Equipment: Rollator (4 wheels);BSC/3in1;Rolling Walker (2 wheels) Additional Comments: Daughter is visiting but lives in [place]; assist from granddaughter who lives nearby PRN    Prior Function Prior Level of Function : Independent/Modified Independent             Mobility Comments: has RW/cane at home but was not using it prior to admission, daughter stating pt does not like to use AD ADLs Comments: Independent with ADLs. Caregiver for husband, completes all IADLs     Extremity/Trunk Assessment   Upper Extremity Assessment Upper Extremity Assessment: Defer to OT evaluation    Lower Extremity Assessment Lower Extremity Assessment: RLE deficits/detail RLE Deficits / Details: R knee immobilizer, range decreased by at least 50% RLE: Unable to fully assess due to immobilization    Cervical / Trunk Assessment Cervical / Trunk Assessment: Kyphotic  Communication   Communication Communication: Impaired Factors Affecting Communication: Hearing impaired    Cognition Arousal: Alert Behavior During Therapy: WFL for tasks assessed/performed   PT - Cognitive impairments: No apparent impairments                         Following commands: Intact       Cueing Cueing Techniques: Verbal cues, Tactile cues, Gestural cues     General Comments      Exercises     Assessment/Plan    PT Assessment Patient needs continued PT services  PT Problem List Decreased strength;Decreased range of motion;Decreased activity tolerance;Decreased balance;Decreased mobility;Decreased coordination;Decreased knowledge of use of DME;Decreased knowledge of precautions;Decreased safety awareness       PT Treatment Interventions DME  instruction;Gait training;Stair training;Functional mobility training;Therapeutic activities;Therapeutic exercise;Balance training;Patient/family education    PT Goals (Current goals can be found in the Care Plan section)  Acute Rehab PT Goals Patient Stated Goal: to decrease pain in R LE and walk better PT Goal Formulation: With patient/family Time For Goal Achievement: 12/12/23 Potential to Achieve Goals: Good    Frequency Min 3X/week     Co-evaluation PT/OT/SLP Co-Evaluation/Treatment: Yes Reason for Co-Treatment: To address functional/ADL transfers PT goals addressed during session: Mobility/safety with mobility;Balance;Proper use of DME;Strengthening/ROM         AM-PAC PT 6 Clicks Mobility  Outcome Measure Help needed turning from your back to your side while in a flat bed without using bedrails?: A Little Help needed moving from lying on your back to sitting on the side of a flat bed without using bedrails?: A Lot Help needed moving to and from a bed to a chair (including a wheelchair)?: A Lot Help needed standing up from a chair using your arms (e.g., wheelchair or bedside chair)?: A Lot Help needed to walk in hospital room?: A Lot Help needed climbing 3-5 steps with a railing? : Total 6 Click Score: 12    End of Session Equipment Utilized During Treatment: Gait belt;Right knee immobilizer Activity Tolerance: Patient limited by pain Patient left: in chair;with call bell/phone within reach;with chair  alarm set;with family/visitor present Nurse Communication: Mobility status;Precautions PT Visit Diagnosis: Unsteadiness on feet (R26.81);Other abnormalities of gait and mobility (R26.89);Repeated falls (R29.6);Muscle weakness (generalized) (M62.81);History of falling (Z91.81)    Time: 0937-1000 PT Time Calculation (min) (ACUTE ONLY): 23 min   Charges:                   Alexea Blase Romero-Perozo, SPT  11/28/2023, 10:30 AM

## 2023-11-28 NOTE — TOC Initial Note (Signed)
 Transition of Care [facility]) - Initial/Assessment Note    Patient Details  Name: Sandra [name] MRN: [id number] Date of Birth: 27-Oct-1945  [facility] of [facility]) CM/SW Contact:    Quintella Suzen Jansky, RN Phone Number: 11/28/2023, 2:05 PM  Clinical Narrative:                 Met with patient and daughter, Sandra [name], at bedside. 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 Agreeable to broaden search to [place], [place] area. TOC will follow-up with bed offers.   PASRR, FL2 and bedsearch completed.   Expected Discharge Plan: Skilled Nursing Facility Barriers to Discharge: Continued Medical Work up   Patient Goals and CMS Choice Patient states their goals for this hospitalization and ongoing recovery are:: get better CMS Medicare.gov Compare Post Acute Care list provided to:: Patient Choice offered to / list presented to : Patient      Expected Discharge Plan and Services   Discharge Planning Services: CM Consult Post Acute Care Choice: Skilled Nursing Facility Living arrangements for the past 2 months: Single Family Home                   DME Agency: NA       HH Arranged: NA          Prior Living Arrangements/Services Living arrangements for the past 2 months: Single Family Home Lives with:: Spouse Patient language and need for interpreter reviewed:: Yes Do you feel safe going back to the place where you live?: Yes      Need for Family Participation in Patient Care: Yes (Comment) Care giver support system in place?: No (comment) Current home services: DME (Rollator (4 wheels);BSC/3in1;Rolling Walker (2 wheels)) Criminal Activity/Legal Involvement Pertinent to Current Situation/Hospitalization: No - Comment as needed  Activities of Daily Living   ADL Screening (condition at time of admission) Independently performs ADLs?: Yes (appropriate for developmental age) Is the patient deaf or have  difficulty hearing?: Yes Does the patient have difficulty seeing, even when wearing glasses/contacts?: No Does the patient have difficulty concentrating, remembering, or making decisions?: No  Permission Sought/Granted                  Emotional Assessment Appearance:: Appears stated age Attitude/Demeanor/Rapport: Apprehensive, Engaged Affect (typically observed): Accepting, Appropriate Orientation: : Oriented to Self, Oriented to Place, Oriented to  Time, Oriented to Situation Alcohol  / Substance Use: Not Applicable Psych Involvement: No (comment)  Admission diagnosis:  Femoral neck fracture (HCC) [S72.009A] Fall, initial encounter [W19.XXXA] Closed fracture of right hip, initial encounter [facility]) [S72.001A] Patient Active Problem List   Diagnosis Date Noted   Femoral neck fracture (HCC) 11/26/2023   Hx of right mastectomy 09/09/2023   Multifocal pneumonia 09/08/2023   Atrial fibrillation S/p cardioversion 09/06/23 , prior watchman's device placement 09/08/2023   History of stroke 09/08/2023   Acute respiratory failure with hypoxia (HCC) 09/08/2023   Left leg swelling 09/08/2023   Pulmonary nodules    Non-obstructive CAD (coronary artery disease)    SOB (shortness of breath) 09/06/2023   Closed left hip fracture, initial encounter (HCC) 07/20/2023   Dyslipidemia 07/20/2023   GERD without esophagitis 07/20/2023   Depression 07/20/2023   Chronic diastolic CHF (congestive heart failure) (HCC) 07/20/2023   Tachycardia-bradycardia syndrome (HCC) 09/23/2022   NAFLD (nonalcoholic fatty liver disease) 87/79/7976   Atrial fibrillation with RVR (HCC) 04/22/2022   Atrial fibrillation with rapid ventricular response (  HCC) 04/21/2022   Demand ischemia (HCC) 04/21/2022   Pyelonephritis 04/09/2022   Acute pyelonephritis 04/07/2022   Dizziness 04/07/2022   Right arm fracture 04/02/2022   Recurrent falls 04/02/2022   Closed nondisplaced fracture of styloid process of right ulna  04/02/2022   Closed fracture of right distal radius 04/02/2022   Closed nondisplaced fracture of cuboid bone of right foot 04/02/2022   Presence of Watchman left atrial appendage closure device    PAF (paroxysmal atrial fibrillation) (HCC) 01/27/2021   Secondary hypercoagulable state (HCC) 12/30/2020   Hemorrhagic cerebrovascular accident (CVA) (HCC) 07/16/2020   Paroxysmal atrial fibrillation (HCC)    History of colonic polyps    Acute on chronic heart failure with preserved ejection fraction (HFpEF) (HCC) 05/17/2017   Essential hypertension 05/17/2017   Glaucoma (increased eye pressure) 02/06/2017   Invasive ductal carcinoma of breast, female, right (HCC) 10/20/2016   Prolapse of vaginal vault after hysterectomy 08/24/2016   Vaginal atrophy 08/24/2016   Dyspareunia in female 08/24/2016   Colon polyps 01/06/2015   BCC (basal cell carcinoma of skin) 01/06/2015   Iron  deficiency anemia 01/06/2015   Hypothyroidism 01/06/2015   Vitamin D  deficiency 01/06/2015   Hyperlipidemia LDL goal <70 01/06/2015   Depression, major 01/06/2015   GAD (generalized anxiety disorder) 01/06/2015   RLS (restless legs syndrome) 01/06/2015   Cataract 01/06/2015   Raynaud phenomenon 01/06/2015   GERD (gastroesophageal reflux disease) 01/06/2015   Celiac disease 01/06/2015   OA (osteoarthritis) 01/06/2015   Osteoporosis 01/06/2015   Pre-diabetes 01/06/2015   Avitaminosis D 01/06/2015   Raynaud's syndrome without gangrene 01/06/2015   Age-related osteoporosis without current pathological fracture 01/06/2015   Anxiety disorder 01/05/2015   PCP:  Sampson Ethridge LABOR, MD Pharmacy:   [facility] [phone] - [place], [place] - [address] AT [facility] [address] [place] [address] Phone: [phone] Fax: [phone]     Social Drivers of Health (SDOH) Social History: SDOH Screenings   Food Insecurity: No Food Insecurity (11/27/2023)  Housing: High Risk (11/27/2023)   Transportation Needs: No Transportation Needs (11/26/2023)  Recent Concern: Transportation Needs - Unmet Transportation Needs (09/10/2023)  Utilities: Not At Risk (11/26/2023)  Recent Concern: Utilities - At Risk (09/10/2023)  Alcohol  Screen: Low Risk  (06/22/2021)  Depression (PHQ2-9): Low Risk  (06/22/2021)  Financial Resource Strain: Low Risk  (05/18/2021)  Physical Activity: Insufficiently Active (05/18/2021)  Social Connections: Unknown (11/27/2023)  Stress: No Stress Concern Present (05/18/2021)  Tobacco Use: Low Risk  (11/27/2023)   SDOH Interventions:     Readmission Risk Interventions     No data to display

## 2023-11-28 NOTE — Progress Notes (Signed)
 Progress Note   Patient: Sandra [name] FMW:[id number] DOB: 1945/12/02 DOA: 11/26/2023     2 DOS: the patient was seen and examined on 11/28/2023   Brief hospital course:  HPI: Sandra [name] is a 78 y.o. female with medical history significant for left hip fracture s/p repair in march of this year, presenting with trip and fall at home with right hip pain.    06/30: admitted for R femoral neck fracture, pubic rami fractures. Plan OR tomorrow 07/01  R hip hemiarthroplasty. Eliquis  held pending ortho recs.     Assessment and Plan:   Right femoral neck fracture Pubic rami fractures S/p mechanical fall at home. Also with right wrist pain, imaging of right knee, right ankle, and right wrist are negative Orthopedic surgery following --> underwent repair on 11/27/2023 Medically optimized for surgery  Continue pain control Bowel regimen PT OT on board and recommending skilled nursing facility [facility] working on this   A-fib S/p watchman device, cardioversion April of this year. Sinus rhythm currently cont home amiodarone  Takes metoprolol  prn afib / palpitations at home, will order here prn HR >120 Holding eliquis  peri-operaively, resume per ortho    Essential HTN  Hydralazine , losartan  Takes metoprolol  prn afib / palpitations at home, will order here prn HR >120   GAD home xanax , sertraline    Hypothyroid home synthroid    Hypokalemia Replace as needed Monitor BMP  No concerns based on BMI: Body mass index is 22.14 kg/m.[name] Significantly low or high BMI is associated with higher medical risk.  Underweight - under 18  overweight - 25 to 29 obese - 30 or more Class 1 obesity: BMI of 30.0 to 34 Class 2 obesity: BMI of 35.0 to 39 Class 3 obesity: BMI of 40.0 to 49 Super Morbid Obesity: BMI 50-59 Super-super Morbid Obesity: BMI 60+ Healthy nutrition and physical activity advised as adjunct to other disease management and risk reduction treatments    Code Status: FULL CODE   TOC  needs: Pending skilled nursing facility   Family Communication: family at bedside on rounds     Subjective:  Patient seen and examined at bedside this morning Admits to improvement in the hip pain TOC working on placement  Physical Exam:  Constitutional:      Appearance: She is not toxic-appearing.    Cardiovascular:     Rate and Rhythm: Normal rate and regular rhythm.  Pulmonary:     Effort: Pulmonary effort is normal.     Breath sounds: Normal breath sounds.  Abdominal:     Palpations: Abdomen is soft.    Musculoskeletal:     Right lower leg: No edema.     Left lower leg: No edema.    Neurological:     General: No focal deficit present.     Mental Status: She is alert and oriented to person, place, and time.    Psychiatric:        Mood and Affect: Mood normal.        Behavior: Behavior normal.   Vitals:   11/28/23 0325 11/28/23 0330 11/28/23 0904 11/28/23 1251  BP: (!) 127/59  (!) 122/56 110/63  Pulse: 66  69 (!) 53  Resp: 18  16 16   Temp: 97.8 F (36.6 C)  (!) 97.4 F (36.3 C) 98.2 F (36.8 C)  TempSrc: Oral  Oral   SpO2: 92% 93% 94% 93%  Weight:      Height:        Data Reviewed:    Latest  Ref Rng & Units 11/28/2023    4:48 AM 11/27/2023    5:25 AM 11/26/2023    6:41 AM  CBC  WBC 4.0 - 10.5 K/uL 8.4  6.0  11.5   Hemoglobin 12.0 - 15.0 g/dL 9.4  89.5  87.4   Hematocrit 36.0 - 46.0 % 29.3  31.7  39.6   Platelets 150 - 400 K/uL 184  194  258        Latest Ref Rng & Units 11/28/2023    4:48 AM 11/27/2023    5:25 AM 11/26/2023    6:41 AM  BMP  Glucose 70 - 99 mg/dL 817  893  864   BUN 8 - 23 mg/dL 19  16  18    Creatinine 0.44 - 1.00 mg/dL 9.32  9.37  9.41   Sodium 135 - 145 mmol/L 135  135  140   Potassium 3.5 - 5.1 mmol/L 4.7  4.0  3.0   Chloride 98 - 111 mmol/L 106  102  105   CO2 22 - 32 mmol/L 22  24  23    Calcium  8.9 - 10.3 mg/dL 8.4  8.5  9.0      Time spent: 42 minutes  Author: Drue ONEIDA Potter, MD 11/28/2023 4:08 PM  For on call review  [email or web address].

## 2023-11-28 NOTE — [place] FL2 (Signed)
   MEDICAID [id number] LEVEL OF CARE FORM     IDENTIFICATION  Patient Name: Sandra [name] Birthdate: 1946/04/07 Sex: female Admission Date (Current Location): 11/26/2023  [place] and [place] Number:  Chiropodist and Address:  [facility], [address], [place], [place] [address]      Provider Number: [id number]  Attending Physician Name and Address:  Dorinda Drue DASEN, MD  Relative Name and Phone Number:  Spouse: Kayla Level, [phone]    Current Level of Care: Hospital Recommended Level of Care: Skilled Nursing Facility Prior Approval Number:    Date Approved/Denied:   PASRR Number: [id number]  Discharge Plan: SNF    Current Diagnoses: Patient Active Problem List   Diagnosis Date Noted   Femoral neck fracture (HCC) 11/26/2023   Hx of right mastectomy 09/09/2023   Multifocal pneumonia 09/08/2023   Atrial fibrillation S/p cardioversion 09/06/23 , prior watchman's device placement 09/08/2023   History of stroke 09/08/2023   Acute respiratory failure with hypoxia (HCC) 09/08/2023   Left leg swelling 09/08/2023   Pulmonary nodules    Non-obstructive CAD (coronary artery disease)    SOB (shortness of breath) 09/06/2023   Closed left hip fracture, initial encounter (HCC) 07/20/2023   Dyslipidemia 07/20/2023   GERD without esophagitis 07/20/2023   Depression 07/20/2023   Chronic diastolic CHF (congestive heart failure) (HCC) 07/20/2023   Tachycardia-bradycardia syndrome (HCC) 09/23/2022   NAFLD (nonalcoholic fatty liver disease) 87/79/7976   Atrial fibrillation with RVR (HCC) 04/22/2022   Atrial fibrillation with rapid ventricular response (HCC) 04/21/2022   Demand ischemia (HCC) 04/21/2022   Pyelonephritis 04/09/2022   Acute pyelonephritis 04/07/2022   Dizziness 04/07/2022   Right arm fracture 04/02/2022   Recurrent falls 04/02/2022   Closed nondisplaced fracture of styloid process of right ulna 04/02/2022   Closed fracture  of right distal radius 04/02/2022   Closed nondisplaced fracture of cuboid bone of right foot 04/02/2022   Presence of Watchman left atrial appendage closure device    PAF (paroxysmal atrial fibrillation) (HCC) 01/27/2021   Secondary hypercoagulable state (HCC) 12/30/2020   Hemorrhagic cerebrovascular accident (CVA) (HCC) 07/16/2020   Paroxysmal atrial fibrillation (HCC)    History of colonic polyps    Acute on chronic heart failure with preserved ejection fraction (HFpEF) (HCC) 05/17/2017   Essential hypertension 05/17/2017   Glaucoma (increased eye pressure) 02/06/2017   Invasive ductal carcinoma of breast, female, right (HCC) 10/20/2016   Prolapse of vaginal vault after hysterectomy 08/24/2016   Vaginal atrophy 08/24/2016   Dyspareunia in female 08/24/2016   Colon polyps 01/06/2015   BCC (basal cell carcinoma of skin) 01/06/2015   Iron  deficiency anemia 01/06/2015   Hypothyroidism 01/06/2015   Vitamin D  deficiency 01/06/2015   Hyperlipidemia LDL goal <70 01/06/2015   Depression, major 01/06/2015   GAD (generalized anxiety disorder) 01/06/2015   RLS (restless legs syndrome) 01/06/2015   Cataract 01/06/2015   Raynaud phenomenon 01/06/2015   GERD (gastroesophageal reflux disease) 01/06/2015   Celiac disease 01/06/2015   OA (osteoarthritis) 01/06/2015   Osteoporosis 01/06/2015   Pre-diabetes 01/06/2015   Avitaminosis D 01/06/2015   Raynaud's syndrome without gangrene 01/06/2015   Age-related osteoporosis without current pathological fracture 01/06/2015   Anxiety disorder 01/05/2015    Orientation RESPIRATION BLADDER Height & Weight     Self, Time, Situation, Place  Normal Incontinent Weight: 56.7 kg Height:  5' 3 (160 cm)  BEHAVIORAL SYMPTOMS/MOOD NEUROLOGICAL BOWEL NUTRITION STATUS      Continent Diet (Regular diet, thin liquid)  AMBULATORY STATUS COMMUNICATION OF NEEDS Skin   Extensive Assist Verbally Surgical wounds                       Personal Care  Assistance Level of Assistance  Bathing, Feeding, Dressing Bathing Assistance: Limited assistance Feeding assistance: Independent Dressing Assistance: Limited assistance     Functional Limitations Info             SPECIAL CARE FACTORS FREQUENCY  PT (By licensed PT), OT (By licensed OT)     PT Frequency: 5 times per week OT Frequency: 5 times per week            Contractures Contractures Info: Not present    Additional Factors Info  Code Status Code Status Info: Full Code             Current Medications (11/28/2023):  This is the current hospital active medication list Current Facility-Administered Medications  Medication Dose Route Frequency Provider Last Rate Last Admin   acetaminophen  (TYLENOL ) tablet 1,000 mg  1,000 mg Oral Q8H Tobie Priest, MD   1,000 mg at 11/28/23 9380   ALPRAZolam  (XANAX ) tablet 0.25-0.5 mg  0.25-0.5 mg Oral BID PRN Tobie Priest, MD   0.25 mg at 11/27/23 2114   amiodarone  (PACERONE ) tablet 100 mg  100 mg Oral Daily Tobie Priest, MD   100 mg at 11/28/23 1019   atorvastatin  (LIPITOR) tablet 40 mg  40 mg Oral Daily Tobie Priest, MD   40 mg at 11/28/23 1019   bisacodyl  (DULCOLAX) suppository 10 mg  10 mg Rectal Daily PRN Tobie Priest, MD       brimonidine  (ALPHAGAN ) 0.2 % ophthalmic solution 1 drop  1 drop Both Eyes BID Tobie Priest, MD   1 drop at 11/28/23 9182   docusate sodium  (COLACE) capsule 100 mg  100 mg Oral BID Tobie Priest, MD   100 mg at 11/28/23 1019   enoxaparin  (LOVENOX ) injection 40 mg  40 mg Subcutaneous Q24H Tobie Priest, MD   40 mg at 11/28/23 9183   hydrALAZINE  (APRESOLINE ) tablet 50 mg  50 mg Oral TID PRN Tobie Priest, MD       HYDROmorphone (DILAUDID) injection 0.2-0.4 mg  0.2-0.4 mg Intravenous Q4H PRN Tobie Priest, MD       latanoprost  (XALATAN ) 0.005 % ophthalmic solution 1 drop  1 drop Both Eyes QHS Tobie Priest, MD   1 drop at 11/27/23 2106   levothyroxine  (SYNTHROID ) tablet 100 mcg  100 mcg Oral Q0600 Tobie Priest, MD   100  mcg at 11/28/23 9379   losartan  (COZAAR ) tablet 75 mg  75 mg Oral Daily Tobie Priest, MD   75 mg at 11/28/23 1019   menthol -cetylpyridinium (CEPACOL) lozenge 3 mg  1 lozenge Oral PRN Tobie Priest, MD       Or   phenol (CHLORASEPTIC) mouth spray 1 spray  1 spray Mouth/Throat PRN Tobie Priest, MD       methocarbamol  (ROBAXIN ) tablet 500 mg  500 mg Oral Q8H PRN Tobie Priest, MD   500 mg at 11/27/23 2250   metoCLOPramide  (REGLAN ) tablet 5-10 mg  5-10 mg Oral Q8H PRN Tobie Priest, MD       Or   metoCLOPramide  (REGLAN ) injection 5-10 mg  5-10 mg Intravenous Q8H PRN Tobie Priest, MD       metoprolol  tartrate (LOPRESSOR ) tablet 12.5 mg  12.5 mg Oral PRN Tobie Priest, MD       ondansetron  (ZOFRAN ) tablet 4 mg  4  mg Oral Q6H PRN Tobie Priest, MD       Or   ondansetron  (ZOFRAN ) injection 4 mg  4 mg Intravenous Q6H PRN Tobie Priest, MD       oxyCODONE  (Oxy IR/ROXICODONE ) immediate release tablet 2.5-5 mg  2.5-5 mg Oral Q4H PRN Tobie Priest, MD   5 mg at 11/28/23 1020   oxyCODONE  (Oxy IR/ROXICODONE ) immediate release tablet 5-10 mg  5-10 mg Oral Q4H PRN Tobie Priest, MD   5 mg at 11/27/23 2250   pantoprazole  (PROTONIX ) EC tablet 40 mg  40 mg Oral Daily Tobie Priest, MD   40 mg at 11/28/23 9182   polyethylene glycol (MIRALAX  / GLYCOLAX ) packet 17 g  17 g Oral Daily Tobie Priest, MD   17 g at 11/28/23 1018   potassium chloride  (KLOR-CON ) packet 40 mEq  40 mEq Oral Daily Tobie Priest, MD   40 mEq at 11/28/23 1019   senna (SENOKOT) tablet 8.6 mg  1 tablet Oral BID Tobie Priest, MD   8.6 mg at 11/28/23 1025   senna-docusate (Senokot-S) tablet 1 tablet  1 tablet Oral QHS PRN Tobie Priest, MD       sertraline  (ZOLOFT ) tablet 100 mg  100 mg Oral Daily Tobie Priest, MD   100 mg at 11/28/23 1019   sodium phosphate  (FLEET) enema 1 enema  1 enema Rectal Once PRN Tobie Priest, MD       traMADol  (ULTRAM ) tablet 50 mg  50 mg Oral Q6H PRN Tobie Priest, MD       zolpidem KAYLA) tablet 5 mg  5 mg Oral QHS PRN Tobie Priest, MD         Discharge Medications: Please see discharge summary for a list of discharge medications.  Relevant Imaging Results:  Relevant Lab Results:   Additional Information [id number]  Quintella Suzen Jansky, RN

## 2023-11-28 NOTE — Plan of Care (Signed)
  Problem: Education: Goal: Knowledge of General Education information will improve Description: Including pain rating scale, medication(s)/side effects and non-pharmacologic comfort measures Outcome: Progressing   Problem: Health Behavior/Discharge Planning: Goal: Ability to manage health-related needs will improve Outcome: Progressing   Problem: Nutrition: Goal: Adequate nutrition will be maintained Outcome: Progressing   Problem: Coping: Goal: Level of anxiety will decrease Outcome: Progressing   Problem: Elimination: Goal: Will not experience complications related to urinary retention Outcome: Progressing   Problem: Safety: Goal: Ability to remain free from injury will improve Outcome: Progressing

## 2023-11-28 NOTE — Progress Notes (Signed)
 Physical Therapy Treatment Patient Details Name: Sandra [name] MRN: [id number] DOB: 1945/08/08 Today's Date: 11/28/2023   History of Present Illness Pt is a 78 year old who presented with a femoral neck fracture after a fall s/p R hemiarthroplasty, posterior approach 11/27/23    PMH significant for previous left hip replacement, paroxysmal atrial fibrillation not on anticoagulation, heart failure, previous stroke    PT Comments  Session involved transferring from recliner to bed. Pt continuing to require multimodal step by step cues when performing STS transfer with min A x2, no LOB experienced. Pt continues to experience heightened pain levels when initiating mobility, RN notified. Ambulation x3' with lateral steps toward bed, min a for LE facilitation onto bed with verbal and tactile cueing on movement. Pt is progressing toward goals and will continue to benefit from skilled PT interventions.    If plan is discharge home, recommend the following: A lot of help with walking and/or transfers;A lot of help with bathing/dressing/bathroom;Assist for transportation;Help with stairs or ramp for entrance   Can travel by private vehicle     No  Equipment Recommendations  None recommended by PT    Recommendations for Other Services       Precautions / Restrictions Precautions Precautions: Posterior Hip;Fall Precaution Booklet Issued: Yes (comment) Recall of Precautions/Restrictions: Intact Precaution/Restrictions Comments: review of posterior hip precautions Required Braces or Orthoses: Knee Immobilizer - Right Knee Immobilizer - Right: On except when in CPM Restrictions Weight Bearing Restrictions Per Provider Order: Yes RLE Weight Bearing Per Provider Order: Weight bearing as tolerated     Mobility  Bed Mobility Overal bed mobility: Needs Assistance Bed Mobility: Sit to Supine     Supine to sit: Min assist, +2 for physical assistance, Used rails, HOB elevated Sit to supine: Min  assist, Used rails   General bed mobility comments: facilitated LE onto bed from seated to supine, verbal and tactile cues needed    Transfers Overall transfer level: Needs assistance Equipment used: Rolling walker (2 wheels) Transfers: Sit to/from Stand Sit to Stand: Min assist           General transfer comment: step by step multi modal cues for technique    Ambulation/Gait Ambulation/Gait assistance: Contact guard assist Gait Distance (Feet): 3 Feet Assistive device: Rolling walker (2 wheels) Gait Pattern/deviations: Step-to pattern, Decreased step length - right, Decreased step length - left, Decreased stance time - right       General Gait Details: side steps to bed   Stairs             Wheelchair Mobility     Tilt Bed    Modified Rankin (Stroke Patients Only)       Balance Overall balance assessment: Needs assistance Sitting-balance support: Bilateral upper extremity supported, Feet supported Sitting balance-Leahy Scale: Fair Sitting balance - Comments: sitting EOB Postural control: Posterior lean Standing balance support: Bilateral upper extremity supported Standing balance-Leahy Scale: Fair Standing balance comment: use of RW                            Communication Communication Communication: Impaired Factors Affecting Communication: Hearing impaired  Cognition Arousal: Alert Behavior During Therapy: WFL for tasks assessed/performed   PT - Cognitive impairments: No apparent impairments                         Following commands: Intact      Cueing Cueing Techniques: Verbal  cues, Tactile cues  Exercises      General Comments        Pertinent Vitals/Pain Pain Assessment Pain Assessment: 0-10 Pain Score: 3  Pain Location: R hip Pain Descriptors / Indicators: Aching, Discomfort, Grimacing, Guarding Pain Intervention(s): Monitored during session, Patient requesting pain meds-RN notified    Home Living  Family/patient expects to be discharged to:: Private residence Living Arrangements: Spouse/significant other Available Help at Discharge: Family (daugther/son live out of town, have people in town to help PRN) Type of Home: House Home Access: Stairs to enter Entrance Stairs-Rails: Can reach both Entrance Stairs-Number of Steps: 2+1 Alternate Level Stairs-Number of Steps: 14 Home Layout: Two level;Able to live on main level with bedroom/bathroom Home Equipment: Rollator (4 wheels);BSC/3in1;Rolling Walker (2 wheels)      Prior Function            PT Goals (current goals can now be found in the care plan section) Acute Rehab PT Goals Patient Stated Goal: to decrease pain in R LE and walk better PT Goal Formulation: With patient/family Time For Goal Achievement: 12/12/23 Potential to Achieve Goals: Good Progress towards PT goals: Progressing toward goals    Frequency    BID      PT Plan      Co-evaluation   Reason for Co-Treatment: To address functional/ADL transfers PT goals addressed during session: Mobility/safety with mobility;Balance;Proper use of DME;Strengthening/ROM OT goals addressed during session: ADL's and self-care      AM-PAC PT 6 Clicks Mobility   Outcome Measure  Help needed turning from your back to your side while in a flat bed without using bedrails?: A Little Help needed moving from lying on your back to sitting on the side of a flat bed without using bedrails?: A Lot Help needed moving to and from a bed to a chair (including a wheelchair)?: A Lot Help needed standing up from a chair using your arms (e.g., wheelchair or bedside chair)?: A Lot Help needed to walk in hospital room?: A Lot Help needed climbing 3-5 steps with a railing? : Total 6 Click Score: 12    End of Session Equipment Utilized During Treatment: Gait belt;Right knee immobilizer Activity Tolerance: Patient tolerated treatment well Patient left: in bed;with call bell/phone  within reach;with bed alarm set Nurse Communication: Mobility status;Patient requests pain meds PT Visit Diagnosis: Unsteadiness on feet (R26.81);Other abnormalities of gait and mobility (R26.89);Repeated falls (R29.6);Muscle weakness (generalized) (M62.81);History of falling (Z91.81)     Time: 8591-8578 PT Time Calculation (min) (ACUTE ONLY): 13 min  Charges:      PT General Charges $$ ACUTE PT VISIT: 1 Visit                       Renato Spellman Romero-Perozo, SPT  11/28/2023, 2:27 PM

## 2023-11-28 NOTE — Discharge Instructions (Signed)

## 2023-11-28 NOTE — Plan of Care (Signed)

## 2023-11-29 DIAGNOSIS — S72001D Fracture of unspecified part of neck of right femur, subsequent encounter for closed fracture with routine healing: Secondary | ICD-10-CM

## 2023-11-29 LAB — BASIC METABOLIC PANEL WITH GFR
Anion gap: 5 (ref 5–15)
BUN: 19 mg/dL (ref 8–23)
CO2: 24 mmol/L (ref 22–32)
Calcium: 8.4 mg/dL — ABNORMAL LOW (ref 8.9–10.3)
Chloride: 107 mmol/L (ref 98–111)
Creatinine, Ser: 0.64 mg/dL (ref 0.44–1.00)
GFR, Estimated: >60 mL/min (ref >60)
Glucose, Bld: 113 mg/dL — ABNORMAL HIGH (ref 70–99)
Potassium: 4.6 mmol/L (ref 3.5–5.1)
Sodium: 136 mmol/L (ref 135–145)

## 2023-11-29 LAB — CBC
HCT: 27.3 % — ABNORMAL LOW (ref 36.0–46.0)
Hemoglobin: 9 g/dL — ABNORMAL LOW (ref 12.0–15.0)
MCH: 30 pg (ref 26.0–34.0)
MCHC: 33 g/dL (ref 30.0–36.0)
MCV: 91 fL (ref 80.0–100.0)
Platelets: 192 10*3/uL (ref 150–400)
RBC: 3 MIL/uL — ABNORMAL LOW (ref 3.87–5.11)
RDW: 13.2 % (ref 11.5–15.5)
WBC: 6.3 10*3/uL (ref 4.0–10.5)
nRBC: 0 % (ref 0.0–0.2)

## 2023-11-29 NOTE — TOC Progression Note (Signed)
 Transition of Care [facility]) - Progression Note    Patient Details  Name: Sandra [name] MRN: [id number] Date of Birth: [birth year]  Transition of [facility]) CM/SW Contact  Quintella Suzen Jansky, RN Phone Number: 11/29/2023, 11:49 AM  Clinical Narrative:     Met with patient at bedside, provided her with her bed offers. List printed off medicare.gov website. 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   Expected Discharge Plan: Skilled Nursing Facility Barriers to Discharge: Continued Medical Work up  Expected Discharge Plan and Services   Discharge Planning Services: CM Consult Post Acute Care Choice: Skilled Nursing Facility Living arrangements for the past 2 months: Single Family Home                   DME Agency: NA       HH Arranged: NA           Social Determinants of Health (SDOH) Interventions SDOH Screenings   Food Insecurity: No Food Insecurity (11/27/2023)  Housing: High Risk (11/27/2023)  Transportation Needs: No Transportation Needs (11/26/2023)  Recent Concern: Transportation Needs - Unmet Transportation Needs (09/10/2023)  Utilities: Not At Risk (11/26/2023)  Recent Concern: Utilities - At Risk (09/10/2023)  Alcohol  Screen: Low Risk  (06/22/2021)  Depression (PHQ2-9): Low Risk  (06/22/2021)  Financial Resource Strain: Low Risk  (05/18/2021)  Physical Activity: Insufficiently Active (05/18/2021)  Social Connections: Unknown (11/27/2023)  Stress: No Stress Concern Present (05/18/2021)  Tobacco Use: Low Risk  (11/27/2023)    Readmission Risk Interventions     No data to display

## 2023-11-29 NOTE — Progress Notes (Signed)
 Physical Therapy Treatment Patient Details Name: Sandra [name] MRN: [id number] DOB: 02-Aug-1945 Today's Date: 11/29/2023   History of Present Illness Pt is a 78 year old who presented with a femoral neck fracture after a fall s/p R hemiarthroplasty, posterior approach 11/27/23    PMH significant for previous left hip replacement, paroxysmal atrial fibrillation not on anticoagulation, heart failure, previous stroke    PT Comments  Pt was long sitting in bed upon arrival. She is well known by author from previous admission. Pt remains cooperative and motivated but pain limited overall. Author issued HEP handout and reviewed posterior hip precautions and importance of knee immobilizer. Pt states understanding however will require re-enforcement in future sessions. Pt remains pain limited however did put forth great effort. She was able to exit L side of bed with increased time and assist. Stood EOB 3 x prior to taking several shuffling,antalgic, step to steps from EOB to recliner. Overall, pt is progressing but slowly due to pain. Recommend continued skilled PT at DC to maximize independence and safety with all ADLs.      If plan is discharge home, recommend the following: A lot of help with walking and/or transfers;A lot of help with bathing/dressing/bathroom;Assist for transportation;Help with stairs or ramp for entrance     Equipment Recommendations  None recommended by PT       Precautions / Restrictions Precautions Precautions: Posterior Hip;Fall Precaution Booklet Issued: Yes (comment) Recall of Precautions/Restrictions: Intact Precaution/Restrictions Comments: review of posterior hip precautions Required Braces or Orthoses: Knee Immobilizer - Right Restrictions Weight Bearing Restrictions Per Provider Order: Yes RLE Weight Bearing Per Provider Order: Weight bearing as tolerated     Mobility  Bed Mobility Overal bed mobility: Needs Assistance Bed Mobility: Sit to Supine, Supine to  Sit  Supine to sit: Used rails, HOB elevated, Mod assist, Max assist Sit to supine: Mod assist, Max assist, Used rails, HOB elevated   Transfers Overall transfer level: Needs assistance Equipment used: Rolling walker (2 wheels) Transfers: Sit to/from Stand Sit to Stand: Min assist, From elevated surface, Mod assist (mod assist form lower surface heights)   Ambulation/Gait Ambulation/Gait assistance: Min assist, Contact guard assist Gait Distance (Feet): 3 Feet Assistive device: Rolling walker (2 wheels) Gait Pattern/deviations: Step-to pattern Gait velocity: decreased  General Gait Details: pt was able to take a few poor quality, shuffling, antalgic steps from EOB to recliner.     Balance Overall balance assessment: Needs assistance Sitting-balance support: Bilateral upper extremity supported, Feet supported Sitting balance-Leahy Scale: Fair     Standing balance support: Bilateral upper extremity supported Standing balance-Leahy Scale: Fair Standing balance comment: Use of RW, no LOB but very pain limited       Communication Communication Communication: Impaired  Cognition Arousal: Alert Behavior During Therapy: WFL for tasks assessed/performed   PT - Cognitive impairments: No apparent impairments      PT - Cognition Comments: Pt is A and O x 4 Following commands: Intact      Cueing Cueing Techniques: Verbal cues, Tactile cues     General Comments General comments (skin integrity, edema, etc.): Issued HEP handout and pt performed several but remains limited by pain.      Pertinent Vitals/Pain Pain Assessment Pain Assessment: 0-10 Pain Score: 8  Pain Location: R hip Pain Descriptors / Indicators: Aching, Discomfort, Grimacing, Guarding Pain Intervention(s): Limited activity within patient's tolerance, Monitored during session, Premedicated before session, Repositioned, Ice applied     PT Goals (current goals can now be found  in the care plan section) Acute  Rehab PT Goals Patient Stated Goal: decrease pain Progress towards PT goals: Progressing toward goals    Frequency    BID       Co-evaluation     PT goals addressed during session: Mobility/safety with mobility;Balance;Proper use of DME;Strengthening/ROM        AM-PAC PT 6 Clicks Mobility   Outcome Measure  Help needed turning from your back to your side while in a flat bed without using bedrails?: A Little Help needed moving from lying on your back to sitting on the side of a flat bed without using bedrails?: A Lot Help needed moving to and from a bed to a chair (including a wheelchair)?: A Lot Help needed standing up from a chair using your arms (e.g., wheelchair or bedside chair)?: A Lot Help needed to walk in hospital room?: A Lot Help needed climbing 3-5 steps with a railing? : Total 6 Click Score: 12    End of Session Equipment Utilized During Treatment: Gait belt;Right knee immobilizer Activity Tolerance: Patient tolerated treatment well Patient left: in chair;with call bell/phone within reach;with chair alarm set Nurse Communication: Mobility status;Patient requests pain meds PT Visit Diagnosis: Unsteadiness on feet (R26.81);Other abnormalities of gait and mobility (R26.89);Repeated falls (R29.6);Muscle weakness (generalized) (M62.81);History of falling (Z91.81)     Time: 9044-8974 PT Time Calculation (min) (ACUTE ONLY): 30 min  Charges:    $Therapeutic Exercise: 8-22 mins $Therapeutic Activity: 8-22 mins PT General Charges $$ ACUTE PT VISIT: 1 Visit                    Rankin Essex PTA 11/29/23, 1:03 PM

## 2023-11-29 NOTE — Progress Notes (Signed)
 Progress Note   Patient: Sandra [name] FMW:[id number] DOB: [birth year] DOA: 11/26/2023     3 DOS: the patient was seen and examined on 11/29/2023     Brief hospital course:  HPI: Sandra [name] is a 78 y.o. female with medical history significant for left hip fracture s/p repair in march of this year, presenting with trip and fall at home with right hip pain.    06/30: admitted for R femoral neck fracture, pubic rami fractures. Plan OR tomorrow 07/01  R hip hemiarthroplasty. Eliquis  held pending ortho recs.      Assessment and Plan:    Right femoral neck fracture Pubic rami fractures S/p mechanical fall at home. Also with right wrist pain, imaging of right knee, right ankle, and right wrist are negative Orthopedic surgery following --> underwent repair on 11/27/2023 Medically optimized for surgery  Continue pain control Continue bowel regimen Continue PT OT who is recommending skilled nursing facility [facility] working on this   A-fib S/p watchman device, cardioversion April of this year. Sinus rhythm currently cont home amiodarone  Takes metoprolol  prn afib / palpitations at home, will order here prn HR >120 Holding eliquis  peri-operatively   Essential HTN  Hydralazine , losartan  Takes metoprolol  prn afib / palpitations at home, will order here prn HR >120   GAD home xanax , sertraline    Hypothyroid home synthroid    Hypokalemia Replace as needed Monitor BMP       Code Status: FULL CODE   TOC needs: Pending skilled nursing facility   Family Communication: family at bedside on rounds      Subjective:  Patient seen and examined at bedside this morning while watching her grandson graduate Denies nausea vomiting abdominal pain Still awaiting placement   Physical Exam:   Constitutional:      Appearance: She is not toxic-appearing.    Cardiovascular:     Rate and Rhythm: Normal rate and regular rhythm.  Pulmonary:     Effort: Pulmonary effort is normal.     Breath  sounds: Normal breath sounds.  Abdominal:     Palpations: Abdomen is soft.    Musculoskeletal:     Right lower leg: No edema.     Left lower leg: No edema.    Neurological:     General: No focal deficit present.     Mental Status: She is alert and oriented to person, place, and time.    Psychiatric:        Mood and Affect: Mood normal.        Behavior: Behavior normal.      Data Reviewed:    Latest Ref Rng & Units 11/29/2023    5:36 AM 11/28/2023    4:48 AM 11/27/2023    5:25 AM  CBC  WBC 4.0 - 10.5 K/uL 6.3  8.4  6.0   Hemoglobin 12.0 - 15.0 g/dL 9.0  9.4  89.5   Hematocrit 36.0 - 46.0 % 27.3  29.3  31.7   Platelets 150 - 400 K/uL 192  184  194        Latest Ref Rng & Units 11/29/2023    5:36 AM 11/28/2023    4:48 AM 11/27/2023    5:25 AM  BMP  Glucose 70 - 99 mg/dL 886  817  893   BUN 8 - 23 mg/dL 19  19  16    Creatinine 0.44 - 1.00 mg/dL 9.35  9.32  9.37   Sodium 135 - 145 mmol/L 136  135  135  Potassium 3.5 - 5.1 mmol/L 4.6  4.7  4.0   Chloride 98 - 111 mmol/L 107  106  102   CO2 22 - 32 mmol/L 24  22  24    Calcium  8.9 - 10.3 mg/dL 8.4  8.4  8.5     Vitals:   11/28/23 1928 11/29/23 0410 11/29/23 0746 11/29/23 1546  BP: (!) 143/62 (!) 143/66 (!) 129/57 (!) 104/43  Pulse: 70 66 66 64  Resp: 18 18 17 18   Temp: 97.9 F (36.6 C) 97.7 F (36.5 C) 97.8 F (36.6 C) 97.7 F (36.5 C)  TempSrc: Oral  Oral   SpO2: 95% 96% 92% 96%  Weight:      Height:         Author: Drue ONEIDA Potter, MD 11/29/2023 5:18 PM  For on call review [email or web address].

## 2023-11-29 NOTE — Care Management Important Message (Signed)
 Important Message  Patient Details  Name: Sandra [name] MRN: [id number] Date of Birth: Oct 31, 1945   Important Message Given:  Yes - Medicare IM     Khali Perella W, CMA 11/29/2023, 11:17 AM

## 2023-11-29 NOTE — Progress Notes (Signed)
 Occupational Therapy Treatment Patient Details Name: Sandra [name] MRN: [id number] DOB: April 01, 1946 Today's Date: 11/29/2023   History of present illness Pt is a 78 year old who presented with a femoral neck fracture after a fall s/p R hemiarthroplasty, posterior approach 11/27/23    PMH significant for previous left hip replacement, paroxysmal atrial fibrillation not on anticoagulation, heart failure, previous stroke   OT comments  Chart reviewed to date, pt greeted in bed, agreeable to OT tx session targeting improving functional activity tolerance in prep for ADL tasks. Pt is making progress towards goals as evidenced by performing bed mobility with MOD-MAX A, STS with MOD A, short amb transfer to 3 in 1 in bsc with MIN-MOD A. MAX A required for toileting. Frequent mutli modal cues required required throughout for technique. Pt is making progress towards goals, discharge recommendation remains appropriate.       If plan is discharge home, recommend the following:  A lot of help with walking and/or transfers;A lot of help with bathing/dressing/bathroom;Help with stairs or ramp for entrance;Assistance with cooking/housework   Equipment Recommendations  Other (comment) (defer to next venue of care)    Recommendations for Other Services      Precautions / Restrictions Precautions Precautions: Posterior Hip;Fall Recall of Precautions/Restrictions: Intact Required Braces or Orthoses: Knee Immobilizer - Right Knee Immobilizer - Right: On except when in CPM Restrictions Weight Bearing Restrictions Per Provider Order: Yes RLE Weight Bearing Per Provider Order: Weight bearing as tolerated       Mobility Bed Mobility Overal bed mobility: Needs Assistance Bed Mobility: Supine to Sit, Sit to Supine     Supine to sit: Used rails, HOB elevated, Mod assist, Max assist Sit to supine: Max assist, HOB elevated, Used rails        Transfers Overall transfer level: Needs assistance Equipment  used: Rolling walker (2 wheels) Transfers: Sit to/from Stand Sit to Stand: Min assist                 Balance Overall balance assessment: Needs assistance Sitting-balance support: Bilateral upper extremity supported, Feet supported Sitting balance-Leahy Scale: Fair     Standing balance support: Bilateral upper extremity supported, During functional activity, Reliant on assistive device for balance Standing balance-Leahy Scale: Fair                             ADL either performed or assessed with clinical judgement   ADL Overall ADL's : Needs assistance/impaired Eating/Feeding: Set up;Sitting                   Lower Body Dressing: Maximal assistance;Sitting/lateral leans   Toilet Transfer: Minimal assistance;Moderate assistance;Rolling walker (2 wheels);BSC/3in1;Adhering to hip precautions;Cueing for sequencing;Cueing for safety Toilet Transfer Details (indicate cue type and reason): step pivot with frequent multi modal cues Toileting- Clothing Manipulation and Hygiene: Maximal assistance;Sitting/lateral lean Toileting - Clothing Manipulation Details (indicate cue type and reason): continent urine on toilet     Functional mobility during ADLs: Minimal assistance-MOD A;Rolling walker (2 wheels);Cueing for sequencing;Cueing for safety (short amb transfers)      Extremity/Trunk Assessment              Vision       Perception     Praxis     Communication Communication Communication: Impaired Factors Affecting Communication: Hearing impaired   Cognition Arousal: Alert Behavior During Therapy: WFL for tasks assessed/performed Cognition: No apparent impairments  Executive functioning impairment (select all impairments): Problem solving OT - Cognition Comments: ?understanding of current level of function                 Following commands: Intact        Cueing   Cueing Techniques: Verbal cues, Tactile cues  Exercises  Other Exercises Other Exercises: edu re: role of OT, role of rehab, discharge recommendations    Shoulder Instructions       General Comments     Pertinent Vitals/ Pain       Pain Assessment Pain Assessment: 0-10 Pain Score: 9  Pain Location: R hip Pain Descriptors / Indicators: Aching, Discomfort, Grimacing, Guarding Pain Intervention(s): Premedicated before session, Monitored during session, Limited activity within patient's tolerance, Patient requesting pain meds-RN notified  Home Living                                          Prior Functioning/Environment              Frequency  Min 2X/week        Progress Toward Goals  OT Goals(current goals can now be found in the care plan section)  Progress towards OT goals: Progressing toward goals  Acute Rehab OT Goals Time For Goal Achievement: 12/12/23  Plan      Co-evaluation             AM-PAC OT 6 Clicks Daily Activity     Outcome Measure   Help from another person eating meals?: None Help from another person taking care of personal grooming?: None Help from another person toileting, which includes using toliet, bedpan, or urinal?: A Lot Help from another person bathing (including washing, rinsing, drying)?: A Lot Help from another person to put on and taking off regular upper body clothing?: None Help from another person to put on and taking off regular lower body clothing?: A Lot 6 Click Score: 18    End of Session Equipment Utilized During Treatment: Rolling walker (2 wheels)  OT Visit Diagnosis: Other abnormalities of gait and mobility (R26.89)   Activity Tolerance Patient tolerated treatment well   Patient Left with call bell/phone within reach;in bed;with bed alarm set   Nurse Communication Mobility status;Patient requests pain meds        Time: 8746-8687 OT Time Calculation (min): 19 min  Charges: OT General Charges $OT Visit: 1 Visit OT Treatments $Self  Care/Home Management : 8-22 mins  Therisa Sheffield, OTD OTR/L  11/29/23, 4:01 PM

## 2023-11-29 NOTE — Plan of Care (Signed)
   Problem: Education: Goal: Knowledge of General Education information will improve Description Including pain rating scale, medication(s)/side effects and non-pharmacologic comfort measures Outcome: Progressing

## 2023-11-29 NOTE — Progress Notes (Signed)
 Physical Therapy Treatment Patient Details Name: Sandra [name] MRN: [id number] DOB: June 28, 1945 Today's Date: 11/29/2023   History of Present Illness Pt is a 78 year old who presented with a femoral neck fracture after a fall s/p R hemiarthroplasty, posterior approach 11/27/23    PMH significant for previous left hip replacement, paroxysmal atrial fibrillation not on anticoagulation, heart failure, previous stroke    PT Comments  Author returned for BID/2nd session. Pt still sitting in recliner from earlier session requesting to return to bed 2/2 to increased pain. Pt required more extensive assistance to stand from lower recliner surface + endorsing much more severe pain versus morning session. Pt was able to take several steps to EOB from recliner. Improved ability to clear LLE/RLE but overall still more so of a shuffle than a quality step. Pt does put forth great effort. Reviewed HEP, need for use of knee immobilizer, hip precautions, and importance of routine mobility. Acute PT will continue to follow per current POC.    If plan is discharge home, recommend the following: A lot of help with walking and/or transfers;A lot of help with bathing/dressing/bathroom;Assist for transportation;Help with stairs or ramp for entrance     Equipment Recommendations  None recommended by PT       Precautions / Restrictions Precautions Precautions: Posterior Hip;Fall Precaution Booklet Issued: Yes (comment) Recall of Precautions/Restrictions: Intact Precaution/Restrictions Comments: review of posterior hip precautions Required Braces or Orthoses: Knee Immobilizer - Right Restrictions Weight Bearing Restrictions Per Provider Order: Yes RLE Weight Bearing Per Provider Order: Weight bearing as tolerated     Mobility  Bed Mobility Overal bed mobility: Needs Assistance Bed Mobility: Sit to Supine  Supine to sit: Used rails, HOB elevated, Mod assist, Max assist Sit to supine: Max assist, Used rails, HOB  elevated General bed mobility comments: pt required more assistance today to exit and re-enter bed. extensive assistance due to pain to safely reurn to bed from short sitting EOB    Transfers Overall transfer level: Needs assistance Equipment used: Rolling walker (2 wheels) Transfers: Sit to/from Stand Sit to Stand: From elevated surface, Mod assist (mod assist from lower surface heights)  General transfer comment: pt required mod assist to stand form recliner surface. did endorse more sever pain today versus previous date.    Ambulation/Gait Ambulation/Gait assistance: Min assist, Mod assist Gait Distance (Feet): 3 Feet Assistive device: Rolling walker (2 wheels) Gait Pattern/deviations: Step-to pattern Gait velocity: decreased  General Gait Details: pt required more asisstance to return to bed, dino feels its due to increased pain.    Balance Overall balance assessment: Needs assistance Sitting-balance support: Bilateral upper extremity supported, Feet supported Sitting balance-Leahy Scale: Fair     Standing balance support: Bilateral upper extremity supported Standing balance-Leahy Scale: Fair Standing balance comment: Use of RW, no LOB but very pain limited     Communication Communication Communication: Impaired  Cognition Arousal: Alert Behavior During Therapy: WFL for tasks assessed/performed   PT - Cognitive impairments: No apparent impairments      PT - Cognition Comments: Pt is A and O x 4 Following commands: Intact      Cueing Cueing Techniques: Verbal cues, Tactile cues     General Comments General comments (skin integrity, edema, etc.): Issued HEP handout and pt performed several but remains limited by pain.      Pertinent Vitals/Pain Pain Assessment Pain Assessment: 0-10 Pain Score: 9  Pain Location: R hip Pain Descriptors / Indicators: Aching, Discomfort, Grimacing, Guarding Pain Intervention(s): Limited  activity within patient's tolerance,  Monitored during session, Premedicated before session, Repositioned, Ice applied     PT Goals (current goals can now be found in the care plan section) Acute Rehab PT Goals Patient Stated Goal: decrease pain Progress towards PT goals: Progressing toward goals    Frequency    BID           Co-evaluation     PT goals addressed during session: Mobility/safety with mobility;Balance;Proper use of DME;Strengthening/ROM        AM-PAC PT 6 Clicks Mobility   Outcome Measure  Help needed turning from your back to your side while in a flat bed without using bedrails?: A Little Help needed moving from lying on your back to sitting on the side of a flat bed without using bedrails?: A Lot Help needed moving to and from a bed to a chair (including a wheelchair)?: A Lot Help needed standing up from a chair using your arms (e.g., wheelchair or bedside chair)?: A Lot Help needed to walk in hospital room?: A Lot Help needed climbing 3-5 steps with a railing? : Total 6 Click Score: 12    End of Session Equipment Utilized During Treatment: Gait belt;Right knee immobilizer Activity Tolerance: Patient tolerated treatment well Patient left: in chair;with call bell/phone within reach;with chair alarm set Nurse Communication: Mobility status;Patient requests pain meds PT Visit Diagnosis: Unsteadiness on feet (R26.81);Other abnormalities of gait and mobility (R26.89);Repeated falls (R29.6);Muscle weakness (generalized) (M62.81);History of falling (Z91.81)     Time: 8840-8788 PT Time Calculation (min) (ACUTE ONLY): 12 min  Charges:    $Gait Training: 8-22 mins $Therapeutic Exercise: 8-22 mins $Therapeutic Activity: 8-22 mins PT General Charges $$ ACUTE PT VISIT: 1 Visit                     Rankin Essex PTA 11/29/23, 1:38 PM

## 2023-11-29 NOTE — Progress Notes (Signed)
 Subjective: 2 Days Post-Op Procedure(s) (LRB): HEMIARTHROPLASTY (BIPOLAR) HIP, POSTERIOR APPROACH FOR FRACTURE (Right) Patient reports pain as mild.   Patient is well, and has had no acute complaints or problems Plan is to go Home versus rehab after hospital stay. Negative for chest pain and shortness of breath Fever: no Gastrointestinal: Negative for nausea and vomiting  Objective: Vital signs in last 24 hours: Temp:  [97.4 F (36.3 C)-98.2 F (36.8 C)] 97.7 F (36.5 C) (07/03 0410) Pulse Rate:  [53-70] 66 (07/03 0410) Resp:  [16-18] 18 (07/03 0410) BP: (110-143)/(56-66) 143/66 (07/03 0410) SpO2:  [93 %-96 %] 96 % (07/03 0410)  Intake/Output from previous day:  Intake/Output Summary (Last 24 hours) at 11/29/2023 0720 Last data filed at 11/29/2023 0300 Gross per 24 hour  Intake 720 ml  Output 1600 ml  Net -880 ml    Intake/Output this shift: No intake/output data recorded.  Labs: Recent Labs    11/27/23 0525 11/28/23 0448 11/29/23 0536  HGB 10.4* 9.4* 9.0*   Recent Labs    11/28/23 0448 11/29/23 0536  WBC 8.4 6.3  RBC 3.21* 3.00*  HCT 29.3* 27.3*  PLT 184 192   Recent Labs    11/28/23 0448 11/29/23 0536  NA 135 136  K 4.7 4.6  CL 106 107  CO2 22 24  BUN 19 19  CREATININE 0.67 0.64  GLUCOSE 182* 113*  CALCIUM  8.4* 8.4*   Recent Labs    11/27/23 0525  INR 1.1     EXAM General - Patient is Alert and Oriented Extremity - Neurovascular intact Sensation intact distally Dorsiflexion/Plantar flexion intact Compartment soft Dressing/Incision - clean, dry, no drainage Motor Function - intact, moving foot and toes well on exam.  Ambulated 3 feet.  Past Medical History:  Diagnosis Date   (HFpEF) heart failure with preserved ejection fraction (HCC)    a. 08/2019 Echo: EF 60-65%, no rwma, mild LVH, Gr2 DD, nl RV fxn. Nl pASP. Mildly dil LA. Mild to mod MR.   Anemia    Anxiety    Arthritis    BRCA gene mutation negative 10/2016   NEGATIVE: [facility]    Breast cancer (HCC) 10/16/2016   T1c,N0;, GRADE I/III, 1.6cm. ER/PR pos  HER2 not over expressed, Right Upper Outer   Celiac disease    Celiac syndrome    Cerebellar hemorrhage, acute (HCC) 07/17/2020   CHF (congestive heart failure) (HCC)    Depression    Dyspnea    WITH EXERTION   Fatty liver    GERD (gastroesophageal reflux disease)    OCC   Glaucoma    right eye   Headache    H/O MIGRAINES AS TEENAGER.   Heart murmur    a. 08/2019 Echo: mild to mod MR.   Hypertension    Hypothyroidism    Non-obstructive CAD (coronary artery disease)    a. 01/2017 MV: Hypertensive response. No ischemia/infarct. EF >65%; b. 09/2019 Cor CTA: Ca2+ = 9.29 (36th percentile). LAD calcified plaque (0-24%), otw nl. Multipel bilat pulm nodules up to 7mm.   Osteoporosis    PAF (paroxysmal atrial fibrillation) (HCC)    a. 02/2020 [facility]: predominantly RSR, 65 (50-105), rare PACs/PVCs, 8 beats NSVT, multiple episodes of PAF lasting up to 1hr . Avg AF rate 130 (93-170). AF burden <1%. Triggered events = RSR, PACs, and PAF; b. CHA2DS2VASc = 6.   Pneumonia    YEARS AGO   Pre-diabetes    Presence of Watchman left atrial appendage closure device 01/2021  Pulmonary nodules    a. 09/2019 Cor CTA: incidental finding of multiple bilat pulm nodules; b. 12/2019 High Res CT: stable, scattered solid pulm nodules.   Raynaud's disease    RLS (restless legs syndrome)    Stroke (HCC) 06/2020   mild balance issues   Wears hearing aid in both ears     Assessment/Plan: 2 Days Post-Op Procedure(s) (LRB): HEMIARTHROPLASTY (BIPOLAR) HIP, POSTERIOR APPROACH FOR FRACTURE (Right) Principal Problem:   Femoral neck fracture (HCC) Active Problems:   Invasive ductal carcinoma of breast, female, right (HCC)   NAFLD (nonalcoholic fatty liver disease)   Chronic diastolic CHF (congestive heart failure) (HCC)   Atrial fibrillation S/p cardioversion 09/06/23 , prior watchman's device placement   Non-obstructive CAD (coronary  artery disease)  Estimated body mass index is 22.14 kg/m as calculated from the following:   Height as of this encounter: 5' 3 (1.6 m).   Weight as of this encounter: 56.7 kg. Advance diet Up with therapy  Hemoglobin 9.0 status post right hip fracture ORIF.  Stable.  Discharge planning: Plan to follow-up at [facility] orthopedics in 2 weeks for staple removal and x-rays of the right hip.  Physical therapy at home versus rehab.  DVT Prophylaxis - Lovenox  and TED hose Weight-Bearing as tolerated to right leg  Krystal Doyne, PA-C Orthopaedic Surgery 11/29/2023, 7:20 AM

## 2023-11-30 DIAGNOSIS — S72001D Fracture of unspecified part of neck of right femur, subsequent encounter for closed fracture with routine healing: Secondary | ICD-10-CM | POA: Diagnosis not present

## 2023-11-30 LAB — CBC
HCT: 27.7 % — ABNORMAL LOW (ref 36.0–46.0)
Hemoglobin: 8.9 g/dL — ABNORMAL LOW (ref 12.0–15.0)
MCH: 29.8 pg (ref 26.0–34.0)
MCHC: 32.1 g/dL (ref 30.0–36.0)
MCV: 92.6 fL (ref 80.0–100.0)
Platelets: 206 K/uL (ref 150–400)
RBC: 2.99 MIL/uL — ABNORMAL LOW (ref 3.87–5.11)
RDW: 13.2 % (ref 11.5–15.5)
WBC: 5.2 K/uL (ref 4.0–10.5)
nRBC: 0 % (ref 0.0–0.2)

## 2023-11-30 LAB — BASIC METABOLIC PANEL WITH GFR
Anion gap: 8 (ref 5–15)
BUN: 17 mg/dL (ref 8–23)
CO2: 22 mmol/L (ref 22–32)
Calcium: 8.3 mg/dL — ABNORMAL LOW (ref 8.9–10.3)
Chloride: 107 mmol/L (ref 98–111)
Creatinine, Ser: 0.45 mg/dL (ref 0.44–1.00)
GFR, Estimated: >60 mL/min (ref >60)
Glucose, Bld: 103 mg/dL — ABNORMAL HIGH (ref 70–99)
Potassium: 4.6 mmol/L (ref 3.5–5.1)
Sodium: 137 mmol/L (ref 135–145)

## 2023-11-30 NOTE — Progress Notes (Signed)
 Physical Therapy Treatment Patient Details Name: Sandra [name] MRN: [id number] DOB: May 27, 1946 Today's Date: 11/30/2023   History of Present Illness Pt is a 78 year old who presented with a femoral neck fracture after a fall s/p R hemiarthroplasty, posterior approach 11/27/23    PMH significant for previous left hip replacement, paroxysmal atrial fibrillation not on anticoagulation, heart failure, previous stroke    PT Comments  Pt reached out to author requesting to return to bed.  I'm just hurting to bad and feel like I need to sleep. RN in room given pain meds at start of session. Pt required increased assistance to stand form recliner. Did tolerate several antalgic step to steps from recliner to EOB however less foot clearance due to pain from earlier session. Max assist to return to supine from EOB and too reposition to [facility]. Unable to tolerate HEP performance at this time. Pt remains motivated but pain limited. Acute PT will continue to follow and progress per current POC. DC recs remain appropriate.      If plan is discharge home, recommend the following: A lot of help with walking and/or transfers;A lot of help with bathing/dressing/bathroom;Assist for transportation;Help with stairs or ramp for entrance     Equipment Recommendations  Other (comment) (Defer to next level of care)       Precautions / Restrictions Precautions Precautions: Posterior Hip;Fall Precaution Booklet Issued: Yes (comment) Recall of Precautions/Restrictions: Intact Precaution/Restrictions Comments: review of posterior hip precautions Required Braces or Orthoses: Knee Immobilizer - Right Knee Immobilizer - Right: On except when in CPM Restrictions Weight Bearing Restrictions Per Provider Order: Yes RLE Weight Bearing Per Provider Order: Weight bearing as tolerated     Mobility  Bed Mobility Overal bed mobility: Needs Assistance Bed Mobility: Sit to Supine  Supine to sit: Used rails, HOB elevated, Mod  assist, Max assist Sit to supine: Max assist, HOB elevated, Used rails General bed mobility comments: pt required extensive assistance to progress BLEs into bed from EOB short sitting    Transfers Overall transfer level: Needs assistance Equipment used: Rolling walker (2 wheels) Transfers: Sit to/from Stand Sit to Stand: Mod assist, From elevated surface  General transfer comment: pt did not tolerate sitting in recliner for more than ~ 1 hour. C/O severe pain. RN gave another round of pain meds prior to pt standing form recliner and taking a few very laborious steps to EOB. extremely antalgic step to steps. worse foot clearance this 2nd session verus earlier AM session    Ambulation/Gait Ambulation/Gait assistance: Min assist, Mod assist Gait Distance (Feet): 3 Feet Assistive device: Rolling walker (2 wheels) Gait Pattern/deviations: Step-to pattern, Antalgic Gait velocity: decreased  General Gait Details: Pt was able to tolerate a few very antalgic step to steps form EOB to recliner. poor foot clearance due to pain and knee immobilizer    Balance Overall balance assessment: Needs assistance Sitting-balance support: Bilateral upper extremity supported, Feet supported Sitting balance-Leahy Scale: Fair Sitting balance - Comments: slight L lateral; lean due to pain   Standing balance support: Bilateral upper extremity supported, During functional activity, Reliant on assistive device for balance Standing balance-Leahy Scale: Fair Standing balance comment: reliant on RW for all standing activity.       Communication Communication Communication: Impaired  Cognition Arousal: Alert Behavior During Therapy: WFL for tasks assessed/performed   PT - Cognitive impairments: No apparent impairments    PT - Cognition Comments: Pt is A and O x 4 Following commands: Intact  Cueing Cueing Techniques: Verbal cues, Tactile cues     General Comments General comments (skin integrity,  edema, etc.): pt continues to request to to hold off on HEP exercises due to pain. will promote HEP more next session.      Pertinent Vitals/Pain Pain Assessment Pain Assessment: 0-10 Pain Score: 9  Pain Location: R hip Pain Descriptors / Indicators: Aching, Discomfort, Grimacing, Guarding Pain Intervention(s): Limited activity within patient's tolerance, Monitored during session, Premedicated before session, Repositioned, Patient requesting pain meds-RN notified     PT Goals (current goals can now be found in the care plan section) Acute Rehab PT Goals Patient Stated Goal: decrease pain Progress towards PT goals: Not progressing toward goals - comment (pain limited today)    Frequency    BID           Co-evaluation     PT goals addressed during session: Mobility/safety with mobility;Balance;Proper use of DME;Strengthening/ROM        AM-PAC PT 6 Clicks Mobility   Outcome Measure  Help needed turning from your back to your side while in a flat bed without using bedrails?: A Little Help needed moving from lying on your back to sitting on the side of a flat bed without using bedrails?: A Lot Help needed moving to and from a bed to a chair (including a wheelchair)?: A Lot Help needed standing up from a chair using your arms (e.g., wheelchair or bedside chair)?: A Lot Help needed to walk in hospital room?: A Lot Help needed climbing 3-5 steps with a railing? : A Lot 6 Click Score: 13    End of Session Equipment Utilized During Treatment: Right knee immobilizer Activity Tolerance: Patient limited by pain Patient left: in chair;with call bell/phone within reach;with chair alarm set Nurse Communication: Mobility status;Patient requests pain meds PT Visit Diagnosis: Unsteadiness on feet (R26.81);Other abnormalities of gait and mobility (R26.89);Repeated falls (R29.6);Muscle weakness (generalized) (M62.81);History of falling (Z91.81)     Time: 1055-1106 PT Time  Calculation (min) (ACUTE ONLY): 11 min  Charges:    $Therapeutic Activity: 8-22 mins PT General Charges $$ ACUTE PT VISIT: 1 Visit                     Rankin Essex PTA 11/30/23, 2:04 PM

## 2023-11-30 NOTE — Progress Notes (Signed)
 Progress Note   Patient: Sandra [name] FMW:[id number] DOB: [birth year] DOA: 11/26/2023     4 DOS: the patient was seen and examined on 11/30/2023     Brief hospital course:  HPI: Sandra [name] is a 78 y.o. female with medical history significant for left hip fracture s/p repair in march of this year, presenting with trip and fall at home with right hip pain.    06/30: admitted for R femoral neck fracture, pubic rami fractures. Plan OR tomorrow 07/01  R hip hemiarthroplasty. Eliquis  held pending ortho recs.      Assessment and Plan:    Right femoral neck fracture Pubic rami fractures S/p mechanical fall at home. Also with right wrist pain, imaging of right knee, right ankle, and right wrist are negative Orthopedic surgery following --> underwent repair on 11/27/2023 Medically optimized for surgery  Continue pain control Continue bowel regimen Continue PT OT who is recommending skilled nursing facility [facility] working on this   Chronic A-fib S/p watchman device, cardioversion April of this year. Sinus rhythm currently cont home amiodarone  Continue metoprolol  Holding eliquis  peri-operatively   Essential HTN  Hydralazine , losartan  Continue metoprolol  GAD Continue home xanax , sertraline    Hypothyroid Continue synthroid    Hypokalemia Replace as needed Monitor BMP       Code Status: FULL CODE   TOC needs: Pending skilled nursing facility   Family Communication: family at bedside on rounds      Subjective:  Seen and examined at bedside this morning still awaiting placement Denies any acute overnight events   Physical Exam:   Constitutional:      Appearance: She is not toxic-appearing.    Cardiovascular:     Rate and Rhythm: Normal rate and regular rhythm.  Pulmonary:     Effort: Pulmonary effort is normal.     Breath sounds: Normal breath sounds.  Abdominal:     Palpations: Abdomen is soft.    Musculoskeletal:     Right lower leg: No edema.     Left lower  leg: No edema.    Neurological:     General: No focal deficit present.     Mental Status: She is alert and oriented to person, place, and time.    Psychiatric:        Mood and Affect: Mood normal.        Behavior: Behavior normal.      Data Reviewed:   Vitals:   11/29/23 1546 11/29/23 1929 11/30/23 0328 11/30/23 0750  BP: (!) 104/43 (!) 103/53 (!) 129/53 (!) 157/66  Pulse: 64 65 67 70  Resp: 18 18 16 17   Temp: 97.7 F (36.5 C) 97.6 F (36.4 C) 98.2 F (36.8 C) 97.7 F (36.5 C)  TempSrc:  Oral Oral   SpO2: 96% 100% 96% 96%  Weight:      Height:          Latest Ref Rng & Units 11/30/2023    3:17 AM 11/29/2023    5:36 AM 11/28/2023    4:48 AM  CBC  WBC 4.0 - 10.5 K/uL 5.2  6.3  8.4   Hemoglobin 12.0 - 15.0 g/dL 8.9  9.0  9.4   Hematocrit 36.0 - 46.0 % 27.7  27.3  29.3   Platelets 150 - 400 K/uL 206  192  184        Latest Ref Rng & Units 11/30/2023    3:17 AM 11/29/2023    5:36 AM 11/28/2023    4:48 AM  BMP  Glucose 70 - 99 mg/dL 896  886  817   BUN 8 - 23 mg/dL 17  19  19    Creatinine 0.44 - 1.00 mg/dL 9.54  9.35  9.32   Sodium 135 - 145 mmol/L 137  136  135   Potassium 3.5 - 5.1 mmol/L 4.6  4.6  4.7   Chloride 98 - 111 mmol/L 107  107  106   CO2 22 - 32 mmol/L 22  24  22    Calcium  8.9 - 10.3 mg/dL 8.3  8.4  8.4      Author: Drue ONEIDA Potter, MD 11/30/2023 3:09 PM  For on call review [email or web address].

## 2023-11-30 NOTE — Progress Notes (Signed)
 Subjective: 3 Days Post-Op Procedure(s) (LRB): HEMIARTHROPLASTY (BIPOLAR) HIP, POSTERIOR APPROACH FOR FRACTURE (Right) Patient reports pain as moderate in the right hip, reports more pain after PT yesterday.  Patient is well, and has had no acute complaints or problems Plan is to go Skilled nursing facility following discharge. Negative for chest pain and shortness of breath Fever: no Gastrointestinal: Negative for nausea and vomiting Patient had a BM yesterday.  Objective: Vital signs in last 24 hours: Temp:  [97.6 F (36.4 C)-98.2 F (36.8 C)] 98.2 F (36.8 C) (07/04 0328) Pulse Rate:  [64-67] 67 (07/04 0328) Resp:  [16-18] 16 (07/04 0328) BP: (103-129)/(43-57) 129/53 (07/04 0328) SpO2:  [92 %-100 %] 96 % (07/04 0328)  Intake/Output from previous day:  Intake/Output Summary (Last 24 hours) at 11/30/2023 0745 Last data filed at 11/30/2023 0338 Gross per 24 hour  Intake 480 ml  Output 1000 ml  Net -520 ml    Intake/Output this shift: No intake/output data recorded.  Labs: Recent Labs    11/28/23 0448 11/29/23 0536 11/30/23 0317  HGB 9.4* 9.0* 8.9*   Recent Labs    11/29/23 0536 11/30/23 0317  WBC 6.3 5.2  RBC 3.00* 2.99*  HCT 27.3* 27.7*  PLT 192 206   Recent Labs    11/29/23 0536 11/30/23 0317  NA 136 137  K 4.6 4.6  CL 107 107  CO2 24 22  BUN 19 17  CREATININE 0.64 0.45  GLUCOSE 113* 103*  CALCIUM  8.4* 8.3*   No results for input(s): LABPT, INR in the last 72 hours.    EXAM General - Patient is Alert and Oriented Extremity - Neurovascular intact Sensation intact distally Dorsiflexion/Plantar flexion intact Compartment soft Dressing/Incision - clean, dry, no drainage noted to the right hip honeycomb dressing. Motor Function - intact, moving foot and toes well on exam.  Ambulated 3 feet.  Past Medical History:  Diagnosis Date   (HFpEF) heart failure with preserved ejection fraction (HCC)    a. 08/2019 Echo: EF 60-65%, no rwma, mild LVH,  Gr2 DD, nl RV fxn. Nl pASP. Mildly dil LA. Mild to mod MR.   Anemia    Anxiety    Arthritis    BRCA gene mutation negative 10/2016   NEGATIVE: [facility]   Breast cancer (HCC) 10/16/2016   T1c,N0;, GRADE I/III, 1.6cm. ER/PR pos  HER2 not over expressed, Right Upper Outer   Celiac disease    Celiac syndrome    Cerebellar hemorrhage, acute (HCC) 07/17/2020   CHF (congestive heart failure) (HCC)    Depression    Dyspnea    WITH EXERTION   Fatty liver    GERD (gastroesophageal reflux disease)    OCC   Glaucoma    right eye   Headache    H/O MIGRAINES AS TEENAGER.   Heart murmur    a. 08/2019 Echo: mild to mod MR.   Hypertension    Hypothyroidism    Non-obstructive CAD (coronary artery disease)    a. 01/2017 MV: Hypertensive response. No ischemia/infarct. EF >65%; b. 09/2019 Cor CTA: Ca2+ = 9.29 (36th percentile). LAD calcified plaque (0-24%), otw nl. Multipel bilat pulm nodules up to 7mm.   Osteoporosis    PAF (paroxysmal atrial fibrillation) (HCC)    a. 02/2020 [facility]: predominantly RSR, 65 (50-105), rare PACs/PVCs, 8 beats NSVT, multiple episodes of PAF lasting up to 1hr . Avg AF rate 130 (93-170). AF burden <1%. Triggered events = RSR, PACs, and PAF; b. CHA2DS2VASc = 6.   Pneumonia  YEARS AGO   Pre-diabetes    Presence of Watchman left atrial appendage closure device 01/2021   Pulmonary nodules    a. 09/2019 Cor CTA: incidental finding of multiple bilat pulm nodules; b. 12/2019 High Res CT: stable, scattered solid pulm nodules.   Raynaud's disease    RLS (restless legs syndrome)    Stroke (HCC) 06/2020   mild balance issues   Wears hearing aid in both ears     Assessment/Plan: 3 Days Post-Op Procedure(s) (LRB): HEMIARTHROPLASTY (BIPOLAR) HIP, POSTERIOR APPROACH FOR FRACTURE (Right) Principal Problem:   Femoral neck fracture (HCC) Active Problems:   Invasive ductal carcinoma of breast, female, right (HCC)   NAFLD (nonalcoholic fatty liver disease)   Chronic diastolic  CHF (congestive heart failure) (HCC)   Atrial fibrillation S/p cardioversion 09/06/23 , prior watchman's device placement   Non-obstructive CAD (coronary artery disease)  Estimated body mass index is 22.14 kg/m as calculated from the following:   Height as of this encounter: 5' 3 (1.6 m).   Weight as of this encounter: 56.7 kg. Advance diet Up with therapy  Labs and vitals reviewed. Hemoglobin 8.9 status post right hip hemiarthroplasty. Patient has had a BM. Up with therapy today. Plan for d/c to SNF.  Discharge planning: Plan to follow-up at [facility] clinic orthopedics in 2 weeks for staple removal and x-rays of the right hip.   Continue Lovenox  40mg  daily for 14 days following discharge.  DVT Prophylaxis - Lovenox  and TED hose Weight-Bearing as tolerated to right leg  J. Gustavo Level, PA-C, CAQ-OS [facility] 11/30/2023, 7:45 AM

## 2023-11-30 NOTE — Plan of Care (Signed)

## 2023-11-30 NOTE — Plan of Care (Signed)

## 2023-11-30 NOTE — Progress Notes (Signed)
 Physical Therapy Treatment Patient Details Name: Sandra [name] MRN: [id number] DOB: 11/24/1945 Today's Date: 11/30/2023   History of Present Illness Pt is a 78 year old who presented with a femoral neck fracture after a fall s/p R hemiarthroplasty, posterior approach 11/27/23    PMH significant for previous left hip replacement, paroxysmal atrial fibrillation not on anticoagulation, heart failure, previous stroke    PT Comments  Pt was long sitting in bed upon arrival with R knee immobilizer in place. She was premedicated prior to session but has increased overall pain versus previous date. Reviewed hip precautions and reason for knee immobilizer. Pt does remain extremely cooperative and pleasant. Require a little more assistance to exit bed and stand today versus previous date due to pain increase. She required increased time + mod-max assist of one to achieve EOB sitting, stood 2 x EOB prior to taking a few very antalgic step to steps to recliner. A lot of time needed to perform stand> sit due to severity of pain. Pt unable to tolerate HEP after getting to recliner due to pain. Acute PT will continue to progress pt per current POC. DC recs remain appropriate to maximize independence and safety with all ADLs    If plan is discharge home, recommend the following: A lot of help with walking and/or transfers;A lot of help with bathing/dressing/bathroom;Assist for transportation;Help with stairs or ramp for entrance     Equipment Recommendations  Other (comment) (Defer to next level of care)       Precautions / Restrictions Precautions Precautions: Posterior Hip;Fall Precaution Booklet Issued: Yes (comment) Recall of Precautions/Restrictions: Intact Precaution/Restrictions Comments: review of posterior hip precautions Required Braces or Orthoses: Knee Immobilizer - Right Knee Immobilizer - Right: On except when in CPM Restrictions Weight Bearing Restrictions Per Provider Order: Yes RLE Weight  Bearing Per Provider Order: Weight bearing as tolerated     Mobility  Bed Mobility Overal bed mobility: Needs Assistance Bed Mobility: Supine to Sit, Sit to Supine  Supine to sit: Used rails, HOB elevated, Mod assist, Max assist  General bed mobility comments: Pt required increased time today due to increased pain. She is overall severely pain limited but continues to put forth great effort    Transfers Overall transfer level: Needs assistance Equipment used: Rolling walker (2 wheels) Transfers: Sit to/from Stand Sit to Stand: Min assist, Mod assist, From elevated surface  General transfer comment: increased assistance to stand and sit today due to increased pain. Cs for handplacement and technique improvements. Difficutl to maintain hip precautions with KNee immobilizer in place.    Ambulation/Gait Ambulation/Gait assistance: Min assist Gait Distance (Feet): 3 Feet Assistive device: Rolling walker (2 wheels) Gait Pattern/deviations: Step-to pattern, Antalgic Gait velocity: decreased  General Gait Details: Pt was able to tolerate a few very antalgic step to steps form EOB to recliner. poor foot clearance due to pain and knee immobilizer    Balance Overall balance assessment: Needs assistance Sitting-balance support: Bilateral upper extremity supported, Feet supported Sitting balance-Leahy Scale: Fair Sitting balance - Comments: slight L lateral; lean due to pain   Standing balance support: Bilateral upper extremity supported, During functional activity, Reliant on assistive device for balance Standing balance-Leahy Scale: Fair Standing balance comment: reliant on RW for all standing activity.     Communication Communication Communication: Impaired  Cognition Arousal: Alert Behavior During Therapy: WFL for tasks assessed/performed   PT - Cognitive impairments: No apparent impairments    PT - Cognition Comments: Pt is A and O  x 4 Following commands: Intact      Cueing  Cueing Techniques: Verbal cues, Tactile cues     General Comments General comments (skin integrity, edema, etc.): reviewed hip precautions and need for continued ther ex throughout the day to promote strengthening and retunr in abilities. Pt elected not to perform exercises at this time due to pain. DC recs rmeian appropriate      Pertinent Vitals/Pain Pain Assessment Pain Assessment: 0-10 Pain Score: 9  Pain Location: R hip Pain Descriptors / Indicators: Aching, Discomfort, Grimacing, Guarding Pain Intervention(s): Limited activity within patient's tolerance, Monitored during session, Premedicated before session, Repositioned, Patient requesting pain meds-RN notified     PT Goals (current goals can now be found in the care plan section) Acute Rehab PT Goals Patient Stated Goal: decrease pain Progress towards PT goals: Progressing toward goals    Frequency    BID           Co-evaluation     PT goals addressed during session: Mobility/safety with mobility;Balance;Proper use of DME;Strengthening/ROM        AM-PAC PT 6 Clicks Mobility   Outcome Measure  Help needed turning from your back to your side while in a flat bed without using bedrails?: A Little Help needed moving from lying on your back to sitting on the side of a flat bed without using bedrails?: A Lot Help needed moving to and from a bed to a chair (including a wheelchair)?: A Lot Help needed standing up from a chair using your arms (e.g., wheelchair or bedside chair)?: A Lot Help needed to walk in hospital room?: A Lot Help needed climbing 3-5 steps with a railing? : A Lot 6 Click Score: 13    End of Session Equipment Utilized During Treatment: Right knee immobilizer Activity Tolerance: Patient limited by pain Patient left: in chair;with call bell/phone within reach;with chair alarm set Nurse Communication: Mobility status;Patient requests pain meds PT Visit Diagnosis: Unsteadiness on feet  (R26.81);Other abnormalities of gait and mobility (R26.89);Repeated falls (R29.6);Muscle weakness (generalized) (M62.81);History of falling (Z91.81)     Time: 1010-1026 PT Time Calculation (min) (ACUTE ONLY): 16 min  Charges:    $Therapeutic Activity: 8-22 mins PT General Charges $$ ACUTE PT VISIT: 1 Visit                    Rankin Essex PTA 11/30/23, 10:50 AM

## 2023-11-30 NOTE — TOC Progression Note (Signed)
 Transition of Care [facility]) - Progression Note    Patient Details  Name: Sandra [name] MRN: [id number] Date of Birth: 1946-04-10  [facility] CM/SW Contact  Quintella Suzen Jansky, RN Phone Number: 11/30/2023, 12:15 PM  Clinical Narrative:    Attempted to meet patient at bedside to discuss bed offers, patient sleeping.    Expected Discharge Plan: Skilled Nursing Facility Barriers to Discharge: Continued Medical Work up  Expected Discharge Plan and Services   Discharge Planning Services: CM Consult Post Acute Care Choice: Skilled Nursing Facility Living arrangements for the past 2 months: Single Family Home                   DME Agency: NA       HH Arranged: NA           Social Determinants of Health (SDOH) Interventions SDOH Screenings   Food Insecurity: No Food Insecurity (11/27/2023)  Housing: High Risk (11/27/2023)  Transportation Needs: No Transportation Needs (11/26/2023)  Recent Concern: Transportation Needs - Unmet Transportation Needs (09/10/2023)  Utilities: Not At Risk (11/26/2023)  Recent Concern: Utilities - At Risk (09/10/2023)  Alcohol  Screen: Low Risk  (06/22/2021)  Depression (PHQ2-9): Low Risk  (06/22/2021)  Financial Resource Strain: Low Risk  (05/18/2021)  Physical Activity: Insufficiently Active (05/18/2021)  Social Connections: Unknown (11/27/2023)  Stress: No Stress Concern Present (05/18/2021)  Tobacco Use: Low Risk  (11/27/2023)    Readmission Risk Interventions     No data to display

## 2023-12-01 DIAGNOSIS — S72001D Fracture of unspecified part of neck of right femur, subsequent encounter for closed fracture with routine healing: Secondary | ICD-10-CM | POA: Diagnosis not present

## 2023-12-01 NOTE — Plan of Care (Signed)
   Problem: Education: Goal: Knowledge of General Education information will improve Description Including pain rating scale, medication(s)/side effects and non-pharmacologic comfort measures Outcome: Progressing   Problem: Activity: Goal: Risk for activity intolerance will decrease Outcome: Progressing   Problem: Safety: Goal: Ability to remain free from injury will improve Outcome: Progressing

## 2023-12-01 NOTE — Progress Notes (Signed)
 Progress Note   Patient: Sandra [name] FMW:[id number] DOB: 12/27/1945 DOA: 11/26/2023     5 DOS: the patient was seen and examined on 12/01/2023    Brief hospital course:  HPI: Sandra [name] is a 78 y.o. female with medical history significant for left hip fracture s/p repair in march of this year, presenting with trip and fall at home with right hip pain.    06/30: admitted for R femoral neck fracture, pubic rami fractures. Plan OR tomorrow 07/01  R hip hemiarthroplasty. Eliquis  held pending ortho recs.      Assessment and Plan:    Right femoral neck fracture Pubic rami fractures S/p mechanical fall at home. Also with right wrist pain, imaging of right knee, right ankle, and right wrist are negative Orthopedic surgery following --> underwent repair on 11/27/2023 Medically optimized for surgery  Continue pain control Continue bowel regimen Continue PT OT who is recommending skilled nursing facility [facility] working on this   Chronic A-fib S/p watchman device, cardioversion April of this year. Sinus rhythm currently cont home amiodarone  Continue metoprolol  Holding eliquis  peri-operatively   Essential HTN  Hydralazine , losartan  Continue metoprolol  GAD Continue home xanax , sertraline    Hypothyroid Continue synthroid    Hypokalemia Replace as needed Monitor BMP       Code Status: FULL CODE   TOC needs: Pending skilled nursing facility   Family Communication: family at bedside on rounds      Subjective:  Patient seen and examined Denies acute overnight event TOC working on placement   Physical Exam:   Constitutional:      Appearance: She is not toxic-appearing.    Cardiovascular:     Rate and Rhythm: Normal rate and regular rhythm.  Pulmonary:     Effort: Pulmonary effort is normal.     Breath sounds: Normal breath sounds.  Abdominal:     Palpations: Abdomen is soft.    Musculoskeletal:     Right lower leg: No edema.     Left lower leg: No edema.     Neurological:     General: No focal deficit present.     Mental Status: She is alert and oriented to person, place, and time.    Psychiatric:        Mood and Affect: Mood normal.        Behavior: Behavior normal.      Data Reviewed:      Vitals:   11/30/23 1928 12/01/23 0316 12/01/23 0732 12/01/23 0902  BP: (!) 106/54 (!) 134/58 128/65 132/64  Pulse: 65 64 65 67  Resp: 16 18 16    Temp: 97.7 F (36.5 C) 97.6 F (36.4 C) 97.9 F (36.6 C)   TempSrc:   Oral   SpO2: 97% 97% 95% 100%  Weight:      Height:          Latest Ref Rng & Units 11/30/2023    3:17 AM 11/29/2023    5:36 AM 11/28/2023    4:48 AM  CBC  WBC 4.0 - 10.5 K/uL 5.2  6.3  8.4   Hemoglobin 12.0 - 15.0 g/dL 8.9  9.0  9.4   Hematocrit 36.0 - 46.0 % 27.7  27.3  29.3   Platelets 150 - 400 K/uL 206  192  184        Latest Ref Rng & Units 11/30/2023    3:17 AM 11/29/2023    5:36 AM 11/28/2023    4:48 AM  BMP  Glucose 70 - 99 mg/dL  103  113  182   BUN 8 - 23 mg/dL 17  19  19    Creatinine 0.44 - 1.00 mg/dL 9.54  9.35  9.32   Sodium 135 - 145 mmol/L 137  136  135   Potassium 3.5 - 5.1 mmol/L 4.6  4.6  4.7   Chloride 98 - 111 mmol/L 107  107  106   CO2 22 - 32 mmol/L 22  24  22    Calcium  8.9 - 10.3 mg/dL 8.3  8.4  8.4      Author: Drue ONEIDA Potter, MD 12/01/2023 2:29 PM  For on call review [email or web address].

## 2023-12-01 NOTE — Progress Notes (Signed)
 Subjective: 4 Days Post-Op Procedure(s) (LRB): HEMIARTHROPLASTY (BIPOLAR) HIP, POSTERIOR APPROACH FOR FRACTURE (Right) Patient reports pain as moderate in the right hip.  Patient is s/p hemi in addition to pubic rami fractures as well. Patient is well, and has had no acute complaints or problems Plan is to go Skilled nursing facility following discharge. Negative for chest pain and shortness of breath Fever: no Gastrointestinal: Negative for nausea and vomiting Patient had a BM yesterday.  Objective: Vital signs in last 24 hours: Temp:  [97.6 F (36.4 C)-97.9 F (36.6 C)] 97.9 F (36.6 C) (07/05 0732) Pulse Rate:  [64-69] 65 (07/05 0732) Resp:  [16-18] 16 (07/05 0732) BP: (106-134)/(54-65) 128/65 (07/05 0732) SpO2:  [95 %-99 %] 95 % (07/05 0732)  Intake/Output from previous day:  Intake/Output Summary (Last 24 hours) at 12/01/2023 0822 Last data filed at 12/01/2023 0734 Gross per 24 hour  Intake 240 ml  Output 1100 ml  Net -860 ml    Intake/Output this shift: Total I/O In: -  Out: 700 [Urine:700]  Labs: Recent Labs    11/29/23 0536 11/30/23 0317  HGB 9.0* 8.9*   Recent Labs    11/29/23 0536 11/30/23 0317  WBC 6.3 5.2  RBC 3.00* 2.99*  HCT 27.3* 27.7*  PLT 192 206   Recent Labs    11/29/23 0536 11/30/23 0317  NA 136 137  K 4.6 4.6  CL 107 107  CO2 24 22  BUN 19 17  CREATININE 0.64 0.45  GLUCOSE 113* 103*  CALCIUM  8.4* 8.3*   No results for input(s): LABPT, INR in the last 72 hours.    EXAM General - Patient is Alert and Oriented Extremity - Neurovascular intact Sensation intact distally Dorsiflexion/Plantar flexion intact Compartment soft Dressing/Incision - clean, dry, no drainage noted to the right hip honeycomb dressing. Motor Function - intact, moving foot and toes well on exam.  Past Medical History:  Diagnosis Date   (HFpEF) heart failure with preserved ejection fraction (HCC)    a. 08/2019 Echo: EF 60-65%, no rwma, mild LVH, Gr2  DD, nl RV fxn. Nl pASP. Mildly dil LA. Mild to mod MR.   Anemia    Anxiety    Arthritis    BRCA gene mutation negative 10/2016   NEGATIVE: [facility]   Breast cancer (HCC) 10/16/2016   T1c,N0;, GRADE I/III, 1.6cm. ER/PR pos  HER2 not over expressed, Right Upper Outer   Celiac disease    Celiac syndrome    Cerebellar hemorrhage, acute (HCC) 07/17/2020   CHF (congestive heart failure) (HCC)    Depression    Dyspnea    WITH EXERTION   Fatty liver    GERD (gastroesophageal reflux disease)    OCC   Glaucoma    right eye   Headache    H/O MIGRAINES AS TEENAGER.   Heart murmur    a. 08/2019 Echo: mild to mod MR.   Hypertension    Hypothyroidism    Non-obstructive CAD (coronary artery disease)    a. 01/2017 MV: Hypertensive response. No ischemia/infarct. EF >65%; b. 09/2019 Cor CTA: Ca2+ = 9.29 (36th percentile). LAD calcified plaque (0-24%), otw nl. Multipel bilat pulm nodules up to 7mm.   Osteoporosis    PAF (paroxysmal atrial fibrillation) (HCC)    a. 02/2020 [facility]: predominantly RSR, 65 (50-105), rare PACs/PVCs, 8 beats NSVT, multiple episodes of PAF lasting up to 1hr . Avg AF rate 130 (93-170). AF burden <1%. Triggered events = RSR, PACs, and PAF; b. CHA2DS2VASc = 6.  Pneumonia    YEARS AGO   Pre-diabetes    Presence of Watchman left atrial appendage closure device 01/2021   Pulmonary nodules    a. 09/2019 Cor CTA: incidental finding of multiple bilat pulm nodules; b. 12/2019 High Res CT: stable, scattered solid pulm nodules.   Raynaud's disease    RLS (restless legs syndrome)    Stroke (HCC) 06/2020   mild balance issues   Wears hearing aid in both ears     Assessment/Plan: 4 Days Post-Op Procedure(s) (LRB): HEMIARTHROPLASTY (BIPOLAR) HIP, POSTERIOR APPROACH FOR FRACTURE (Right) Principal Problem:   Femoral neck fracture (HCC) Active Problems:   Invasive ductal carcinoma of breast, female, right (HCC)   NAFLD (nonalcoholic fatty liver disease)   Chronic diastolic CHF  (congestive heart failure) (HCC)   Atrial fibrillation S/p cardioversion 09/06/23 , prior watchman's device placement   Non-obstructive CAD (coronary artery disease)  Estimated body mass index is 22.14 kg/m as calculated from the following:   Height as of this encounter: 5' 3 (1.6 m).   Weight as of this encounter: 56.7 kg. Advance diet Up with therapy  Labs and vitals reviewed.  CBC ordered this AM. Hemoglobin 8.9 status post right hip hemiarthroplasty. Patient has had a BM. Up with therapy today. Plan for d/c to SNF.  Discharge planning: Plan to follow-up at [facility] clinic orthopedics in 2 weeks for staple removal and x-rays of the right hip.   Continue Lovenox  40mg  daily for 14 days following discharge.  Will discuss with Dr. Tobie on Lovenox  vs. Return to home dose of Eliquis  at discharge for DVT prophylaxis.  DVT Prophylaxis - Lovenox  and TED hose Weight-Bearing as tolerated to right leg  J. Gustavo Level, PA-C, CAQ-OS [facility] Orthopaedic Surgery 12/01/2023, 8:22 AM

## 2023-12-01 NOTE — Progress Notes (Signed)
 Physical Therapy Treatment Patient Details Name: Sandra [name] MRN: [id number] DOB: [birth year] Today's Date: 12/01/2023   History of Present Illness Pt is a 78 year old who presented with a femoral neck fracture after a fall s/p R hemiarthroplasty, posterior approach 11/27/23    PMH significant for previous left hip replacement, paroxysmal atrial fibrillation not on anticoagulation, heart failure, previous stroke    PT Comments  Pt was semi supine in bed with RN at bedside upon arrival. She remains A and O but does have some forgetfulness observed during session. Reviewed hip precautions and importance of adhering for proper healing. Pt continues to have limited progress with PT due to severity of pain. Pt endorses 10/10 pain in wt bearing. Unable to tolerate wt acceptance on RLE to allow advancement/ proper step with LLE.  She tends to shuffling/pivot to recliner. Acute PT will continue to promote increased OOB activity while assisting pt to PLOF. DC recs remain appropriate to maximize independence and safety with all ADLs.    If plan is discharge home, recommend the following: A lot of help with walking and/or transfers;A lot of help with bathing/dressing/bathroom;Assist for transportation;Help with stairs or ramp for entrance     Equipment Recommendations  Other (comment) (Defer to next level of care)       Precautions / Restrictions Precautions Precautions: Posterior Hip;Fall Precaution Booklet Issued: Yes (comment) Recall of Precautions/Restrictions: Intact Precaution/Restrictions Comments: review of posterior hip precautions Required Braces or Orthoses: Knee Immobilizer - Right Knee Immobilizer - Right: On except when in CPM Restrictions Weight Bearing Restrictions Per Provider Order: Yes RLE Weight Bearing Per Provider Order: Weight bearing as tolerated     Mobility  Bed Mobility Overal bed mobility: Needs Assistance Bed Mobility: Supine to Sit  Supine to sit: Max assist, Used  rails, HOB elevated (HOB elevated ~ 30 degrees)  General bed mobility comments: Pt continues to require extensive assistance to safely achieve EOB sitting. Author had pt attempt to use sheet to progress RLE towards EOB but pt unable. Pt remains extremely pain limited overall    Transfers Overall transfer level: Needs assistance Equipment used: Rolling walker (2 wheels) Transfers: Sit to/from Stand, Bed to chair/wheelchair/BSC Sit to Stand: Mod assist, From elevated surface, +2 safety/equipment  General transfer comment: pt continues to struggle with wt acceptance RLE. Severe L lateral lean in sitting and and in standing. Required vcs for technique and hip precautions. knee immobilizer donned throughout.    Ambulation/Gait Ambulation/Gait assistance: Mod assist Gait Distance (Feet): 3 Feet Assistive device: Rolling walker (2 wheels) Gait Pattern/deviations: Step-to pattern, Antalgic, Decreased stance time - left, Shuffle Gait velocity: decreased  General Gait Details: pt continues to struggle to accept wt on RLE to allow LLE advancement. Pain remains limiting progress with therapy     Balance Overall balance assessment: Needs assistance Sitting-balance support: Bilateral upper extremity supported, Feet supported Sitting balance-Leahy Scale: Fair Sitting balance - Comments: L lateral lean due to pain   Standing balance support: Bilateral upper extremity supported, During functional activity, Reliant on assistive device for balance Standing balance-Leahy Scale: Fair Standing balance comment: left lateral lean in standing due to pain     Communication Communication Communication: Impaired Factors Affecting Communication: Hearing impaired  Cognition Arousal: Alert Behavior During Therapy: WFL for tasks assessed/performed   PT - Cognitive impairments: No apparent impairments    PT - Cognition Comments: Pt is alert and orineted but a little forgetful. reviewe dhip  precautions Following commands: Intact  Cueing Cueing Techniques: Verbal cues, Tactile cues     General Comments General comments (skin integrity, edema, etc.): pt contiues to endorse sveere 10/10 pain with any all wt bearing attempts. RN in room during session      Pertinent Vitals/Pain Pain Assessment Pain Assessment: 0-10 Pain Score: 10-Worst pain ever Pain Location: R hip/ groin Pain Descriptors / Indicators: Aching, Discomfort, Grimacing, Guarding Pain Intervention(s): Limited activity within patient's tolerance, Monitored during session, Premedicated before session, Repositioned     PT Goals (current goals can now be found in the care plan section) Acute Rehab PT Goals Patient Stated Goal: decrease pain Progress towards PT goals: Not progressing toward goals - comment (pain limiting)    Frequency    BID       Co-evaluation     PT goals addressed during session: Mobility/safety with mobility;Balance;Proper use of DME;Strengthening/ROM        AM-PAC PT 6 Clicks Mobility   Outcome Measure  Help needed turning from your back to your side while in a flat bed without using bedrails?: A Lot Help needed moving from lying on your back to sitting on the side of a flat bed without using bedrails?: A Lot Help needed moving to and from a bed to a chair (including a wheelchair)?: A Lot Help needed standing up from a chair using your arms (e.g., wheelchair or bedside chair)?: A Lot Help needed to walk in hospital room?: A Lot Help needed climbing 3-5 steps with a railing? : Total 6 Click Score: 11    End of Session Equipment Utilized During Treatment: Right knee immobilizer Activity Tolerance: Patient limited by pain Patient left: in chair;with call bell/phone within reach;with chair alarm set Nurse Communication: Mobility status;Patient requests pain meds PT Visit Diagnosis: Unsteadiness on feet (R26.81);Other abnormalities of gait and mobility (R26.89);Repeated  falls (R29.6);Muscle weakness (generalized) (M62.81);History of falling (Z91.81)     Time: 9150-9096 PT Time Calculation (min) (ACUTE ONLY): 14 min  Charges:    $Therapeutic Activity: 8-22 mins PT General Charges $$ ACUTE PT VISIT: 1 Visit                    Rankin Essex PTA 12/01/23, 10:24 AM

## 2023-12-01 NOTE — Progress Notes (Signed)
 Physical Therapy Treatment Patient Details Name: Sandra [name] MRN: [id number] DOB: 04/20/1946 Today's Date: 12/01/2023   History of Present Illness Pt is a 78 year old who presented with a femoral neck fracture after a fall s/p R hemiarthroplasty, posterior approach 11/27/23    PMH significant for previous left hip replacement, paroxysmal atrial fibrillation not on anticoagulation, heart failure, previous stroke    PT Comments  Pt remains extremely limited by pain and poor tolerance to wt bearing on RLE. Requesting to return to bed after minimal OOB time. RN gave meds prior to author assisting pt back to bed. Poor wt acceptance on RLE remains pt's biggest barrier for progression towards PLOF/ PT goals. Once return to bed, pt quickly drifted off to sleep. Author continues to Nurse, children's have +2 assistance for any/all OOB activity. DC recs remain appropriate to maximize her independence and safety with all ADLs.    If plan is discharge home, recommend the following: A lot of help with walking and/or transfers;A lot of help with bathing/dressing/bathroom;Assist for transportation;Help with stairs or ramp for entrance     Equipment Recommendations  Other (comment) (Defer to next level of care)       Precautions / Restrictions Precautions Precautions: Posterior Hip;Fall Precaution Booklet Issued: Yes (comment) Recall of Precautions/Restrictions: Intact Precaution/Restrictions Comments: review of posterior hip precautions Required Braces or Orthoses: Knee Immobilizer - Right Knee Immobilizer - Right: On except when in CPM Restrictions Weight Bearing Restrictions Per Provider Order: Yes RLE Weight Bearing Per Provider Order: Weight bearing as tolerated     Mobility  Bed Mobility Overal bed mobility: Needs Assistance Bed Mobility: Sit to Supine Supine to sit: Used rails, HOB elevated, Mod assist, Max assist Sit to supine: Max assist, HOB elevated, Used rails General bed mobility  comments: pt required extensive assistance to progress BLEs into bed from EOB short sitting    Transfers Overall transfer level: Needs assistance Equipment used: Rolling walker (2 wheels) Transfers: Sit to/from Stand, Bed to chair/wheelchair/BSC Sit to Stand: Max assist Stand pivot transfers: Max assist  General transfer comment: Pt again  C/O severe pain. Worse foot clearance this 2nd session verus earlier AM session. pt remains unable to tolerate wt on RLE to advance LLE. Poor ability to stand> sit without assistance due to pain    Ambulation/Gait Ambulation/Gait assistance: Mod assist Gait Distance (Feet): 3 Feet Assistive device: Rolling walker (2 wheels) Gait Pattern/deviations: Step-to pattern, Antalgic, Decreased stance time - left, Shuffle Gait velocity: decreased  General Gait Details: pt continues to struggle to accept wt on RLE to allow LLE advancement. Pain remains limiting progress with therapy    Balance Overall balance assessment: Needs assistance Sitting-balance support: Bilateral upper extremity supported, Feet supported Sitting balance-Leahy Scale: Fair Sitting balance - Comments: slight L lateral; lean due to pain   Standing balance support: Bilateral upper extremity supported, During functional activity, Reliant on assistive device for balance Standing balance-Leahy Scale: Fair Standing balance comment: reliant on RW for all standing activity.       Communication Communication Communication: Impaired Factors Affecting Communication: Hearing impaired  Cognition Arousal: Alert Behavior During Therapy: WFL for tasks assessed/performed   PT - Cognitive impairments: No apparent impairments    PT - Cognition Comments: Pt is alert and oriented x 3 but a little forgetful.HOH. Reviewed hip precautions Following commands: Intact      Cueing Cueing Techniques: Verbal cues, Tactile cues     General Comments General comments (skin integrity, edema, etc.): pt  quickly drifted off to sleep once back in bed. Autho feels its due to pai med recently(just prior ) to session      Pertinent Vitals/Pain Pain Assessment Pain Assessment: 0-10 Pain Score: 10-Worst pain ever Pain Location: R hip Pain Descriptors / Indicators: Aching, Discomfort, Grimacing, Guarding Pain Intervention(s): Limited activity within patient's tolerance, Monitored during session, Premedicated before session, Repositioned     PT Goals (current goals can now be found in the care plan section) Acute Rehab PT Goals Patient Stated Goal: decrease pain Progress towards PT goals: Not progressing toward goals - comment (remains severely pain limited)    Frequency    BID       Co-evaluation     PT goals addressed during session: Mobility/safety with mobility;Balance;Proper use of DME;Strengthening/ROM        AM-PAC PT 6 Clicks Mobility   Outcome Measure  Help needed turning from your back to your side while in a flat bed without using bedrails?: A Little Help needed moving from lying on your back to sitting on the side of a flat bed without using bedrails?: A Lot Help needed moving to and from a bed to a chair (including a wheelchair)?: A Lot Help needed standing up from a chair using your arms (e.g., wheelchair or bedside chair)?: A Lot Help needed to walk in hospital room?: A Lot Help needed climbing 3-5 steps with a railing? : A Lot 6 Click Score: 13    End of Session Equipment Utilized During Treatment: Right knee immobilizer Activity Tolerance: Patient limited by pain Patient left: in chair;with call bell/phone within reach;with chair alarm set Nurse Communication: Mobility status;Patient requests pain meds PT Visit Diagnosis: Unsteadiness on feet (R26.81);Other abnormalities of gait and mobility (R26.89);Repeated falls (R29.6);Muscle weakness (generalized) (M62.81);History of falling (Z91.81)     Time: 8951-8897 PT Time Calculation (min) (ACUTE ONLY): 14  min  Charges:    $Therapeutic Activity: 8-22 mins PT General Charges $$ ACUTE PT VISIT: 1 Visit                    Rankin Essex PTA 12/01/23, 1:53 PM

## 2023-12-01 NOTE — Plan of Care (Signed)

## 2023-12-02 DIAGNOSIS — S72001D Fracture of unspecified part of neck of right femur, subsequent encounter for closed fracture with routine healing: Secondary | ICD-10-CM | POA: Diagnosis not present

## 2023-12-02 MED ORDER — ORAL CARE MOUTH RINSE: 15.0000 mL | OROMUCOSAL | Status: DC | PRN

## 2023-12-02 NOTE — Progress Notes (Signed)
 Subjective: 5 Days Post-Op Procedure(s) (LRB): HEMIARTHROPLASTY (BIPOLAR) HIP, POSTERIOR APPROACH FOR FRACTURE (Right) Patient reports pain as moderate in the right hip.  Patient is s/p hemi in addition to pubic rami fractures as well. Patient is well, and has had no acute complaints or problems Plan is to go Skilled nursing facility following discharge. Negative for chest pain and shortness of breath Fever: no Gastrointestinal: Negative for nausea and vomiting Patient has had a BM following surgery.  Objective: Vital signs in last 24 hours: Temp:  [97.7 F (36.5 C)-98.1 F (36.7 C)] 97.7 F (36.5 C) (07/06 0730) Pulse Rate:  [61-70] 66 (07/06 0730) Resp:  [16-20] 16 (07/06 0730) BP: (107-132)/(49-65) 132/64 (07/06 0730) SpO2:  [94 %-100 %] 97 % (07/06 0730)  Intake/Output from previous day:  Intake/Output Summary (Last 24 hours) at 12/02/2023 1220 Last data filed at 12/01/2023 1944 Gross per 24 hour  Intake 720 ml  Output 150 ml  Net 570 ml    Intake/Output this shift: No intake/output data recorded.  Labs: Recent Labs    11/30/23 0317  HGB 8.9*   Recent Labs    11/30/23 0317  WBC 5.2  RBC 2.99*  HCT 27.7*  PLT 206   Recent Labs    11/30/23 0317  NA 137  K 4.6  CL 107  CO2 22  BUN 17  CREATININE 0.45  GLUCOSE 103*  CALCIUM  8.3*   No results for input(s): LABPT, INR in the last 72 hours.    EXAM General - Patient is Alert and Oriented Extremity - Neurovascular intact Sensation intact distally Dorsiflexion/Plantar flexion intact Compartment soft Dressing/Incision - Mild bloody drainage noted to the right hip honeycomb dressing, new dressing applied this AM. Motor Function - intact, moving foot and toes well on exam.  Past Medical History:  Diagnosis Date   (HFpEF) heart failure with preserved ejection fraction (HCC)    a. 08/2019 Echo: EF 60-65%, no rwma, mild LVH, Gr2 DD, nl RV fxn. Nl pASP. Mildly dil LA. Mild to mod MR.   Anemia     Anxiety    Arthritis    BRCA gene mutation negative 10/2016   NEGATIVE: [facility]   Breast cancer (HCC) 10/16/2016   T1c,N0;, GRADE I/III, 1.6cm. ER/PR pos  HER2 not over expressed, Right Upper Outer   Celiac disease    Celiac syndrome    Cerebellar hemorrhage, acute (HCC) 07/17/2020   CHF (congestive heart failure) (HCC)    Depression    Dyspnea    WITH EXERTION   Fatty liver    GERD (gastroesophageal reflux disease)    OCC   Glaucoma    right eye   Headache    H/O MIGRAINES AS TEENAGER.   Heart murmur    a. 08/2019 Echo: mild to mod MR.   Hypertension    Hypothyroidism    Non-obstructive CAD (coronary artery disease)    a. 01/2017 MV: Hypertensive response. No ischemia/infarct. EF >65%; b. 09/2019 Cor CTA: Ca2+ = 9.29 (36th percentile). LAD calcified plaque (0-24%), otw nl. Multipel bilat pulm nodules up to 7mm.   Osteoporosis    PAF (paroxysmal atrial fibrillation) (HCC)    a. 02/2020 Zio: predominantly RSR, 65 (50-105), rare PACs/PVCs, 8 beats NSVT, multiple episodes of PAF lasting up to 1hr . Avg AF rate 130 (93-170). AF burden <1%. Triggered events = RSR, PACs, and PAF; b. CHA2DS2VASc = 6.   Pneumonia    YEARS AGO   Pre-diabetes    Presence of Watchman left atrial  appendage closure device 01/2021   Pulmonary nodules    a. 09/2019 Cor CTA: incidental finding of multiple bilat pulm nodules; b. 12/2019 High Res CT: stable, scattered solid pulm nodules.   Raynaud's disease    RLS (restless legs syndrome)    Stroke (HCC) 06/2020   mild balance issues   Wears hearing aid in both ears     Assessment/Plan: 5 Days Post-Op Procedure(s) (LRB): HEMIARTHROPLASTY (BIPOLAR) HIP, POSTERIOR APPROACH FOR FRACTURE (Right) Principal Problem:   Femoral neck fracture (HCC) Active Problems:   Invasive ductal carcinoma of breast, female, right (HCC)   NAFLD (nonalcoholic fatty liver disease)   Chronic diastolic CHF (congestive heart failure) (HCC)   Atrial fibrillation S/p  cardioversion 09/06/23 , prior watchman's device placement   Non-obstructive CAD (coronary artery disease)  Estimated body mass index is 22.14 kg/m as calculated from the following:   Height as of this encounter: 5' 3 (1.6 m).   Weight as of this encounter: 56.7 kg. Advance diet Up with therapy  Vitals reviewed this AM. New honeycomb dressing applied. Patient has had a BM. Up with therapy today. Plan for d/c to SNF.  Discharge planning: Plan to follow-up at [facility] clinic orthopedics in 2 weeks for staple removal and x-rays of the right hip.  Staples can be removed on 12/11/23 Resume home dose of Eliquis  at discharge for DVT prophylaxis.  DVT Prophylaxis - Lovenox  and TED hose Weight-Bearing as tolerated to right leg  J. Gustavo Level, PA-C, CAQ-OS [facility] Orthopaedic Surgery 12/02/2023, 12:20 PM

## 2023-12-02 NOTE — Progress Notes (Signed)
 Physical Therapy Treatment Patient Details Name: Sandra [name] MRN: [id number] DOB: 04-28-1946 Today's Date: 12/02/2023   History of Present Illness Pt is a 78 year old who presented with a femoral neck fracture after a fall s/p R hemiarthroplasty, posterior approach 11/27/23    PMH significant for previous left hip replacement, paroxysmal atrial fibrillation not on anticoagulation, heart failure, previous stroke    PT Comments  Pt ready for session.  Premedicated.  Bed wet as purwic suction not on.  She is assisted to EOB with features and mod a x 1.  Steady in sitting but L lean to off load hip.  She is able to stand with min a x 1 and shuffle step to [facility].  While on [facility] she assists with full bathing and care as she is able.  Stands again with min a x 1 and cga x 1 for lateral shuffle steps to recliner.  While pain remains a barrier, she overall dues much better with mobility today with less assist.  Remained in recliner after session with needs met.    If plan is discharge home, recommend the following: A lot of help with walking and/or transfers;A lot of help with bathing/dressing/bathroom;Assist for transportation;Help with stairs or ramp for entrance   Can travel by private vehicle        Equipment Recommendations       Recommendations for Other Services       Precautions / Restrictions Precautions Precautions: Posterior Hip;Fall Precaution Booklet Issued: Yes (comment) Recall of Precautions/Restrictions: Intact Precaution/Restrictions Comments: review of posterior hip precautions Required Braces or Orthoses: Knee Immobilizer - Right Restrictions Weight Bearing Restrictions Per Provider Order: Yes RLE Weight Bearing Per Provider Order: Weight bearing as tolerated     Mobility  Bed Mobility Overal bed mobility: Needs Assistance Bed Mobility: Supine to Sit     Supine to sit: Min assist, Mod assist, HOB elevated, Used rails       Patient Response:  Cooperative  Transfers Overall transfer level: Needs assistance Equipment used: Rolling walker (2 wheels) Transfers: Sit to/from Stand Sit to Stand: Min assist                Ambulation/Gait Ambulation/Gait assistance: Editor, commissioning (Feet): 3 Feet Assistive device: Rolling walker (2 wheels)   Gait velocity: decreased     General Gait Details: shuffling gait with poor WB RLE but overall much improved today   Stairs             Wheelchair Mobility     Tilt Bed Tilt Bed Patient Response: Cooperative  Modified Rankin (Stroke Patients Only)       Balance Overall balance assessment: Needs assistance Sitting-balance support: Bilateral upper extremity supported, Feet supported Sitting balance-Leahy Scale: Fair Sitting balance - Comments: slight L lateral; lean due to pain   Standing balance support: Bilateral upper extremity supported, During functional activity, Reliant on assistive device for balance Standing balance-Leahy Scale: Fair Standing balance comment: reliant on RW for all standing activity.                            Communication Communication Communication: Impaired Factors Affecting Communication: Hearing impaired  Cognition Arousal: Alert Behavior During Therapy: WFL for tasks assessed/performed   PT - Cognitive impairments: No apparent impairments                         Following commands: Intact  Cueing Cueing Techniques: Verbal cues, Tactile cues  Exercises Other Exercises Other Exercises: assisted tech with bathing on BSC due to pur wic not on and bed wet with urine.  she is able to void on [facility]    General Comments        Pertinent Vitals/Pain Pain Assessment Pain Assessment: Faces Faces Pain Scale: Hurts whole lot Pain Location: R hip, but does not impact mobility as significanlty today Pain Descriptors / Indicators: Aching, Discomfort, Grimacing, Guarding Pain Intervention(s): Limited  activity within patient's tolerance, Monitored during session, Premedicated before session, Repositioned    Home Living                          Prior Function            PT Goals (current goals can now be found in the care plan section) Progress towards PT goals: Progressing toward goals    Frequency    BID      PT Plan      Co-evaluation              AM-PAC PT 6 Clicks Mobility   Outcome Measure  Help needed turning from your back to your side while in a flat bed without using bedrails?: A Little Help needed moving from lying on your back to sitting on the side of a flat bed without using bedrails?: A Lot Help needed moving to and from a bed to a chair (including a wheelchair)?: A Little Help needed standing up from a chair using your arms (e.g., wheelchair or bedside chair)?: A Little Help needed to walk in hospital room?: A Lot Help needed climbing 3-5 steps with a railing? : Total 6 Click Score: 14    End of Session Equipment Utilized During Treatment: Right knee immobilizer Activity Tolerance: Patient tolerated treatment well;Patient limited by pain Patient left: in chair;with call bell/phone within reach;with chair alarm set Nurse Communication: Mobility status;Patient requests pain meds PT Visit Diagnosis: Unsteadiness on feet (R26.81);Other abnormalities of gait and mobility (R26.89);Repeated falls (R29.6);Muscle weakness (generalized) (M62.81);History of falling (Z91.81)     Time: 9077-9054 PT Time Calculation (min) (ACUTE ONLY): 23 min  Charges:    $Therapeutic Activity: 23-37 mins PT General Charges $$ ACUTE PT VISIT: 1 Visit                   Lauraine Gills, PTA 12/02/23, 11:16 AM

## 2023-12-02 NOTE — Progress Notes (Signed)
  Progress Note   Patient: Sandra [name] FMW:[id number] DOB: [birth year] DOA: 11/26/2023     6 DOS: the patient was seen and examined on 12/02/2023     Brief hospital course:  HPI: HILDRETH ORSAK is a 78 y.o. female with medical history significant for left hip fracture s/p repair in march of this year, presenting with trip and fall at home with right hip pain.    06/30: admitted for R femoral neck fracture, pubic rami fractures. Plan OR tomorrow 07/01  R hip hemiarthroplasty. Eliquis  held pending ortho recs.      Assessment and Plan:    Right femoral neck fracture Pubic rami fractures S/p mechanical fall at home. Also with right wrist pain, imaging of right knee, right ankle, and right wrist are negative Orthopedic surgery following --> underwent repair on 11/27/2023 Medically optimized for surgery  Continue pain control Continue bowel regimen Continue PT OT who is recommending skilled nursing facility Case manager informed about patient wanting to talk to them about SNF decisions   Chronic A-fib S/p watchman device, cardioversion April of this year. Sinus rhythm currently cont home amiodarone  Continue metoprolol  Holding eliquis  peri-operatively   Essential HTN  Hydralazine , losartan  Continue metoprolol  GAD Continue home xanax , sertraline    Hypothyroid Continue synthroid    Hypokalemia Replace as needed Monitor BMP       Code Status: FULL CODE   TOC needs: Pending skilled nursing facility   Family Communication: family at bedside on rounds      Subjective:  Patient seen and examined Denies nausea vomiting abdominal pain chest pain Awaiting placement   Physical Exam:   Constitutional:      Appearance: She is not toxic-appearing.    Cardiovascular:     Rate and Rhythm: Normal rate and regular rhythm.  Pulmonary:     Effort: Pulmonary effort is normal.     Breath sounds: Normal breath sounds.  Abdominal:     Palpations: Abdomen is soft.     Musculoskeletal:     Right lower leg: No edema.     Left lower leg: No edema.    Neurological:     General: No focal deficit present.     Mental Status: She is alert and oriented to person, place, and time.    Psychiatric:        Mood and Affect: Mood normal.        Behavior: Behavior normal.      Data Reviewed:     Latest Ref Rng & Units 11/30/2023    3:17 AM 11/29/2023    5:36 AM 11/28/2023    4:48 AM  CBC  WBC 4.0 - 10.5 K/uL 5.2  6.3  8.4   Hemoglobin 12.0 - 15.0 g/dL 8.9  9.0  9.4   Hematocrit 36.0 - 46.0 % 27.7  27.3  29.3   Platelets 150 - 400 K/uL 206  192  184    Vitals:   12/01/23 2043 12/02/23 0339 12/02/23 0730 12/02/23 1540  BP: (!) 119/49 114/65 132/64 126/60  Pulse: 61 70 66 64  Resp: 20 16 16 17   Temp:  98.1 F (36.7 C) 97.7 F (36.5 C) 98.2 F (36.8 C)  TempSrc:   Oral Oral  SpO2: 100% 98% 97% 98%  Weight:      Height:         Author: Drue ONEIDA Potter, MD 12/02/2023 4:35 PM  For on call review [email or web address].

## 2023-12-02 NOTE — TOC Progression Note (Signed)
 Transition of Care [facility]) - Progression Note    Patient Details  Name: Sandra [name] MRN: [id number] Date of Birth: 1945-08-03  [facility] CM/SW Contact  Marinda Cooks, RN Phone Number: 12/02/2023, 5:03 PM  Clinical Narrative:    This CM spoke with pt regarding dc plan to SNF and bed selection. Pt agreed with this plan and informed that her 1st choice for bed selection is [facility] . TOC will f/u with Admission liaison at facility to confirm bed offer tomm Mon 07/07 . TOC will to follow dc planning / care coordination and update as applicable  Expected Discharge Plan: Skilled Nursing Facility Barriers to Discharge: Continued Medical Work up  Expected Discharge Plan and Services   Discharge Planning Services: CM Consult Post Acute Care Choice: Skilled Nursing Facility Living arrangements for the past 2 months: Single Family Home                   DME Agency: NA       HH Arranged: NA           Social Determinants of Health (SDOH) Interventions SDOH Screenings   Food Insecurity: No Food Insecurity (11/27/2023)  Housing: High Risk (11/27/2023)  Transportation Needs: No Transportation Needs (11/26/2023)  Recent Concern: Transportation Needs - Unmet Transportation Needs (09/10/2023)  Utilities: Not At Risk (11/26/2023)  Recent Concern: Utilities - At Risk (09/10/2023)  Alcohol  Screen: Low Risk  (06/22/2021)  Depression (PHQ2-9): Low Risk  (06/22/2021)  Financial Resource Strain: Low Risk  (05/18/2021)  Physical Activity: Insufficiently Active (05/18/2021)  Social Connections: Unknown (11/27/2023)  Stress: No Stress Concern Present (05/18/2021)  Tobacco Use: Low Risk  (11/27/2023)    Readmission Risk Interventions     No data to display

## 2023-12-02 NOTE — Plan of Care (Signed)
  Problem: Education: Goal: Knowledge of General Education information will improve Description: Including pain rating scale, medication(s)/side effects and non-pharmacologic comfort measures Outcome: Progressing   Problem: Nutrition: Goal: Adequate nutrition will be maintained Outcome: Progressing   Problem: Pain Managment: Goal: General experience of comfort will improve and/or be controlled Outcome: Progressing   Problem: Safety: Goal: Ability to remain free from injury will improve Outcome: Progressing   Problem: Skin Integrity: Goal: Risk for impaired skin integrity will decrease Outcome: Progressing

## 2023-12-03 ENCOUNTER — Ambulatory Visit: Payer: Self-pay

## 2023-12-03 DIAGNOSIS — I495 Sick sinus syndrome: Secondary | ICD-10-CM

## 2023-12-03 DIAGNOSIS — S72001D Fracture of unspecified part of neck of right femur, subsequent encounter for closed fracture with routine healing: Secondary | ICD-10-CM | POA: Diagnosis not present

## 2023-12-03 LAB — CUP PACEART REMOTE DEVICE CHECK
Date Time Interrogation Session: 20250706233620
Implantable Pulse Generator Implant Date: 20240522

## 2023-12-03 NOTE — Progress Notes (Signed)
 Occupational Therapy Treatment Patient Details Name: Sandra [name] MRN: [id number] DOB: [birth year] Today's Date: 12/03/2023   History of present illness Pt is a 78 year old who presented with a R femoral neck fracture + pubic rami fx after a fall s/p R hemiarthroplasty, posterior approach 11/27/23. PMH significant for previous left hip replacement, paroxysmal atrial fibrillation not on anticoagulation, heart failure, previous stroke   OT comments  Pt motivated to participate in session despite pain; LPN provides pain meds start of session. Pt requires MOD A to exit bed with assist for RLE mgmt, initially sits EOB with posterior/L lateral lean due to discomfort and pain on hip, MIN A progressing to standby for static balance. Oral care performed with setup, pt tolerates 10 min sitting. MOD A to perform STS using RW, shuffles a few steps laterally before returning to seated. MAX A to return to supine. Pt making progress towards goals, OT will continue POC. Discharge recommendation remains appropriate.       If plan is discharge home, recommend the following:  A lot of help with walking and/or transfers;A lot of help with bathing/dressing/bathroom;Help with stairs or ramp for entrance;Assistance with cooking/housework   Equipment Recommendations  Other (comment)       Precautions / Restrictions Precautions Precautions: Posterior Hip;Fall Precaution Booklet Issued: Yes (comment) Recall of Precautions/Restrictions: Intact Precaution/Restrictions Comments: review of posterior hip precautions Required Braces or Orthoses: Knee Immobilizer - Right Knee Immobilizer - Right: On except when in CPM Restrictions Weight Bearing Restrictions Per Provider Order: Yes RLE Weight Bearing Per Provider Order: Weight bearing as tolerated       Mobility Bed Mobility Overal bed mobility: Needs Assistance Bed Mobility: Supine to Sit     Supine to sit: Mod assist, HOB elevated, Used rails Sit to supine:  Max assist, HOB elevated, Used rails   General bed mobility comments: slow transition, MAX A for RLE to slide off bed, assist for trunk MIN A    Transfers Overall transfer level: Needs assistance Equipment used: Rolling walker (2 wheels) Transfers: Sit to/from Stand Sit to Stand: Mod assist           General transfer comment: Pain meds recieved during session. Pt too fatigued to attempt further transfers or mobility. MOD A to rise from EOB, cues for hand placement. Keeps RLE elevated and hesitant to WB     Balance Overall balance assessment: Needs assistance Sitting-balance support: Bilateral upper extremity supported, Feet supported Sitting balance-Leahy Scale: Fair Sitting balance - Comments: slight L lateral; lean due to pain, requires up to MIN A for static sitting balance initially but is able to progress to standby assist Postural control: Posterior lean, Left lateral lean Standing balance support: Bilateral upper extremity supported, During functional activity, Reliant on assistive device for balance Standing balance-Leahy Scale: Poor Standing balance comment: reliant on RW for all standing activity, assist at trunk to maintain upright                           ADL either performed or assessed with clinical judgement   ADL Overall ADL's : Needs assistance/impaired     Grooming: Set up;Sitting;Oral care Grooming Details (indicate cue type and reason): sitting EOB                             Functional mobility during ADLs: Minimal assistance;Moderate assistance;Rolling walker (2 wheels);Cueing for sequencing;Cueing for safety General ADL  Comments: Pt sits EOB for 10 mins for oral care, stands with MIN - MOD A using RW and takes lateral sidesteps towards HOB      Cognition Arousal: Alert Behavior During Therapy: WFL for tasks assessed/performed Cognition: No apparent impairments             OT - Cognition Comments: ?understanding of  current level of function                          Cueing      Exercises      Shoulder Instructions       General Comments KI donned, adjusted for comfort. pillowcase was placed under KI    Pertinent Vitals/ Pain       Pain Assessment Pain Score: 9  Pain Location: R hip, pelvis Pain Descriptors / Indicators: Aching, Discomfort, Grimacing, Guarding Pain Intervention(s): Limited activity within patient's tolerance, Monitored during session, RN gave pain meds during session, Repositioned   Frequency  Min 2X/week        Progress Toward Goals  OT Goals(current goals can now be found in the care plan section)     Acute Rehab OT Goals OT Goal Formulation: With patient Time For Goal Achievement: 12/12/23 Potential to Achieve Goals: Good  Plan         AM-PAC OT 6 Clicks Daily Activity     Outcome Measure   Help from another person eating meals?: None Help from another person taking care of personal grooming?: None Help from another person toileting, which includes using toliet, bedpan, or urinal?: A Lot Help from another person bathing (including washing, rinsing, drying)?: A Lot Help from another person to put on and taking off regular upper body clothing?: None Help from another person to put on and taking off regular lower body clothing?: A Lot 6 Click Score: 18    End of Session Equipment Utilized During Treatment: Rolling walker (2 wheels);Gait belt  OT Visit Diagnosis: Other abnormalities of gait and mobility (R26.89)   Activity Tolerance Patient tolerated treatment well   Patient Left with call bell/phone within reach;in bed;with bed alarm set   Nurse Communication Mobility status;Patient requests pain meds        Time: 1456-1530 OT Time Calculation (min): 34 min  Charges: OT General Charges $OT Visit: 1 Visit OT Treatments $Self Care/Home Management : 23-37 mins  Karmello Abercrombie L. Demico Ploch, OTR/L  12/03/23, 3:50 PM

## 2023-12-03 NOTE — Plan of Care (Signed)
  Problem: Education: Goal: Knowledge of the prescribed therapeutic regimen will improve Outcome: Progressing Goal: Understanding of discharge needs will improve Outcome: Progressing   

## 2023-12-03 NOTE — Progress Notes (Signed)
 Physical Therapy Treatment Patient Details Name: Sandra [name] MRN: [id number] DOB: Oct 10, 1945 Today's Date: 12/03/2023   History of Present Illness Pt is a 78 year old who presented with a femoral neck fracture after a fall s/p R hemiarthroplasty, posterior approach 11/27/23    PMH significant for previous left hip replacement, paroxysmal atrial fibrillation not on anticoagulation, heart failure, previous stroke    PT Comments  Pre-medicated. To EOB with mod a x 1 to get hips fully to EOB.  Does seem to struggle a bit more today.  1st stand unsuccessful to get fully upright but is able on second with min a x 1.  She slowly pivots to recliner at bedside but no good quality steps taken.  Isometric exercises in recliner and KI is removed to check skin - no redness or breakdown noted but skin is rippled from foam lining.  Pillow case placed for skin integrity and comfort prior to replacing.  Pt stated it felt better.     If plan is discharge home, recommend the following: A lot of help with walking and/or transfers;A lot of help with bathing/dressing/bathroom;Assist for transportation;Help with stairs or ramp for entrance   Can travel by private vehicle        Equipment Recommendations       Recommendations for Other Services       Precautions / Restrictions Precautions Precautions: Posterior Hip;Fall Precaution Booklet Issued: Yes (comment) Recall of Precautions/Restrictions: Intact Precaution/Restrictions Comments: review of posterior hip precautions Required Braces or Orthoses: Knee Immobilizer - Right Restrictions Weight Bearing Restrictions Per Provider Order: Yes RLE Weight Bearing Per Provider Order: Weight bearing as tolerated     Mobility  Bed Mobility Overal bed mobility: Needs Assistance Bed Mobility: Supine to Sit     Supine to sit: Mod assist, HOB elevated, Used rails       Patient Response: Cooperative  Transfers Overall transfer level: Needs  assistance Equipment used: Rolling walker (2 wheels) Transfers: Sit to/from Stand Sit to Stand: Mod assist                Ambulation/Gait Ambulation/Gait assistance: Min assist Gait Distance (Feet): 3 Feet Assistive device: Rolling walker (2 wheels) Gait Pattern/deviations: Step-to pattern, Antalgic, Decreased stance time - left, Shuffle Gait velocity: decreased     General Gait Details: shuffling gait to chair   Stairs             Wheelchair Mobility     Tilt Bed Tilt Bed Patient Response: Cooperative  Modified Rankin (Stroke Patients Only)       Balance Overall balance assessment: Needs assistance Sitting-balance support: Bilateral upper extremity supported, Feet supported Sitting balance-Leahy Scale: Fair     Standing balance support: Bilateral upper extremity supported, During functional activity, Reliant on assistive device for balance   Standing balance comment: reliant on RW for all standing activity.                            Communication Communication Communication: Impaired Factors Affecting Communication: Hearing impaired  Cognition Arousal: Alert Behavior During Therapy: WFL for tasks assessed/performed   PT - Cognitive impairments: No apparent impairments                       PT - Cognition Comments: a bit down today and encoragement is given Following commands: Intact      Cueing Cueing Techniques: Verbal cues, Tactile cues  Exercises Other Exercises  Other Exercises: long sitting isometrics, removed KI brace to check skin.  education provided.  pillowcase put under brace as skin is dimpled from foam of brace and c/o some irriatation but no redness or breakdown noted.    General Comments        Pertinent Vitals/Pain Pain Assessment Pain Assessment: Faces Faces Pain Scale: Hurts even more Pain Location: R hip, pelvis Pain Descriptors / Indicators: Aching, Discomfort, Grimacing, Guarding Pain  Intervention(s): Limited activity within patient's tolerance, Monitored during session, Premedicated before session, Repositioned    Home Living                          Prior Function            PT Goals (current goals can now be found in the care plan section) Progress towards PT goals: Progressing toward goals    Frequency    BID      PT Plan      Co-evaluation              AM-PAC PT 6 Clicks Mobility   Outcome Measure  Help needed turning from your back to your side while in a flat bed without using bedrails?: A Little Help needed moving from lying on your back to sitting on the side of a flat bed without using bedrails?: A Lot Help needed moving to and from a bed to a chair (including a wheelchair)?: A Little Help needed standing up from a chair using your arms (e.g., wheelchair or bedside chair)?: A Little Help needed to walk in hospital room?: A Lot Help needed climbing 3-5 steps with a railing? : Total 6 Click Score: 14    End of Session Equipment Utilized During Treatment: Right knee immobilizer;Gait belt Activity Tolerance: Patient tolerated treatment well;Patient limited by pain Patient left: in chair;with call bell/phone within reach;with chair alarm set Nurse Communication: Mobility status;Patient requests pain meds PT Visit Diagnosis: Unsteadiness on feet (R26.81);Other abnormalities of gait and mobility (R26.89);Repeated falls (R29.6);Muscle weakness (generalized) (M62.81);History of falling (Z91.81)     Time: 9052-8987 PT Time Calculation (min) (ACUTE ONLY): 25 min  Charges:    $Therapeutic Exercise: 8-22 mins $Therapeutic Activity: 8-22 mins PT General Charges $$ ACUTE PT VISIT: 1 Visit                   Lauraine Gills, PTA 12/03/23, 10:31 AM

## 2023-12-03 NOTE — Plan of Care (Signed)
  Problem: Clinical Measurements: Goal: Ability to maintain clinical measurements within normal limits will improve Outcome: Progressing   Problem: Activity: Goal: Risk for activity intolerance will decrease Outcome: Progressing   Problem: Elimination: Goal: Will not experience complications related to bowel motility Outcome: Progressing   Problem: Pain Managment: Goal: General experience of comfort will improve and/or be controlled Outcome: Progressing   Problem: Safety: Goal: Ability to remain free from injury will improve Outcome: Progressing   Problem: Skin Integrity: Goal: Risk for impaired skin integrity will decrease Outcome: Progressing

## 2023-12-03 NOTE — Progress Notes (Signed)
 Progress Note   Patient: Sandra [name] FMW:[id number] DOB: May 14, 1946 DOA: 11/26/2023     7 DOS: the patient was seen and examined on 12/03/2023    Brief hospital course:  HPI: Sandra [name] is a 78 y.o. female with medical history significant for left hip fracture s/p repair in march of this year, presenting with trip and fall at home with right hip pain.    06/30: admitted for R femoral neck fracture, pubic rami fractures. Plan OR tomorrow 07/01  R hip hemiarthroplasty. Eliquis  held pending ortho recs.      Assessment and Plan:    Right femoral neck fracture Pubic rami fractures S/p mechanical fall at home. Also with right wrist pain, imaging of right knee, right ankle, and right wrist are negative Orthopedic surgery following --> underwent repair on 11/27/2023 Medically optimized for surgery  Continue pain control Continue bowel regimen Continue PT OT who is recommending skilled nursing facility Patient has made the choice of facility currently awaiting insurance prior authorization  Chronic A-fib S/p watchman device, cardioversion April of this year. Sinus rhythm currently cont home amiodarone  Continue metoprolol  Holding eliquis  peri-operatively   Essential HTN  Hydralazine , losartan  Continue metoprolol  GAD Continue home xanax , sertraline    Hypothyroid Continue synthroid    Hypokalemia Replace as needed Monitor BMP       Code Status: FULL CODE   TOC needs: Pending skilled nursing facility   Family Communication: family at bedside on rounds      Subjective:  Patient seen and examined She denies nausea vomiting abdominal pain chest pain TOC manager working on English as a second language teacher   Physical Exam:   Constitutional:      Appearance: She is not toxic-appearing.    Cardiovascular:     Rate and Rhythm: Normal rate and regular rhythm.  Pulmonary:     Effort: Pulmonary effort is normal.     Breath sounds: Normal breath sounds.  Abdominal:      Palpations: Abdomen is soft.    Musculoskeletal:     Right lower leg: No edema.     Left lower leg: No edema.    Neurological:     General: No focal deficit present.     Mental Status: She is alert and oriented to person, place, and time.    Psychiatric:        Mood and Affect: Mood normal.        Behavior: Behavior normal.      Data Reviewed: Vitals:   12/02/23 1955 12/03/23 0443 12/03/23 0804 12/03/23 1449  BP: (!) 110/49 118/60 (!) 145/70 (!) 118/56  Pulse: 62 (!) 59 63 63  Resp: 16 14 16 16   Temp: (!) 97.5 F (36.4 C) 98.2 F (36.8 C) 97.7 F (36.5 C) 98.5 F (36.9 C)  TempSrc:   Oral   SpO2: 98% 92% 97% 100%  Weight:      Height:          Latest Ref Rng & Units 11/30/2023    3:17 AM 11/29/2023    5:36 AM 11/28/2023    4:48 AM  CBC  WBC 4.0 - 10.5 K/uL 5.2  6.3  8.4   Hemoglobin 12.0 - 15.0 g/dL 8.9  9.0  9.4   Hematocrit 36.0 - 46.0 % 27.7  27.3  29.3   Platelets 150 - 400 K/uL 206  192  184        Latest Ref Rng & Units 11/30/2023    3:17 AM 11/29/2023    5:36 AM  11/28/2023    4:48 AM  BMP  Glucose 70 - 99 mg/dL 896  886  817   BUN 8 - 23 mg/dL 17  19  19    Creatinine 0.44 - 1.00 mg/dL 9.54  9.35  9.32   Sodium 135 - 145 mmol/L 137  136  135   Potassium 3.5 - 5.1 mmol/L 4.6  4.6  4.7   Chloride 98 - 111 mmol/L 107  107  106   CO2 22 - 32 mmol/L 22  24  22    Calcium  8.9 - 10.3 mg/dL 8.3  8.4  8.4      Author: Drue ONEIDA Potter, MD 12/03/2023 4:39 PM  For on call review [email or web address].

## 2023-12-04 DIAGNOSIS — S72001D Fracture of unspecified part of neck of right femur, subsequent encounter for closed fracture with routine healing: Secondary | ICD-10-CM | POA: Diagnosis not present

## 2023-12-04 NOTE — Progress Notes (Signed)
 Physical Therapy Treatment Patient Details Name: Sandra [name] MRN: [id number] DOB: 03/21/1946 Today's Date: 12/04/2023   History of Present Illness Pt is a 78 year old who presented with a R femoral neck fracture + pubic rami fx after a fall s/p R hemiarthroplasty, posterior approach 11/27/23. PMH significant for previous left hip replacement, paroxysmal atrial fibrillation not on anticoagulation, heart failure, previous stroke    PT Comments  Pt ready for session.  Participated in exercises as described below.  She is able to progress to EOB with bed features and mod a x 1 but does seem to be able to move more on her own today.  Steady in sitting with less lean/offloading of hip,  stands with min a x 1 with improved technique and less assist today.  Standing AROM and weight shifting.  Pt voiced need to void so she sits while BSC is obtained.  She is able to take good quality steps to [facility], void, attend to her own care before transferring 180 degrees to recliner and take 3 steps forward and back.  Pt with good progression of mobility today.  Encouragement given as she feels progress is slow.  Remained up in chair after session.   If plan is discharge home, recommend the following: A lot of help with walking and/or transfers;A lot of help with bathing/dressing/bathroom;Assist for transportation;Help with stairs or ramp for entrance   Can travel by private vehicle        Equipment Recommendations       Recommendations for Other Services       Precautions / Restrictions Precautions Precautions: Posterior Hip;Fall Precaution Booklet Issued: Yes (comment) Recall of Precautions/Restrictions: Intact Precaution/Restrictions Comments: review of posterior hip precautions Required Braces or Orthoses: Knee Immobilizer - Right Knee Immobilizer - Right: On except when in CPM Restrictions Weight Bearing Restrictions Per Provider Order: Yes RLE Weight Bearing Per Provider Order: Weight bearing as  tolerated     Mobility  Bed Mobility Overal bed mobility: Needs Assistance Bed Mobility: Supine to Sit     Supine to sit: Mod assist, Min assist, Used rails, HOB elevated     General bed mobility comments: improved today but still needs assist Patient Response: Cooperative  Transfers Overall transfer level: Needs assistance Equipment used: Rolling walker (2 wheels) Transfers: Sit to/from Stand Sit to Stand: Min assist           General transfer comment: improved today with better hand placements and ability to stand    Ambulation/Gait Ambulation/Gait assistance: Min assist Gait Distance (Feet): 6 Feet Assistive device: Rolling walker (2 wheels) Gait Pattern/deviations: Step-to pattern, Antalgic, Decreased stance time - left, Shuffle Gait velocity: decreased     General Gait Details: shuffling gait to chair   Stairs             Wheelchair Mobility     Tilt Bed Tilt Bed Patient Response: Cooperative  Modified Rankin (Stroke Patients Only)       Balance Overall balance assessment: Needs assistance Sitting-balance support: Bilateral upper extremity supported, Feet supported Sitting balance-Leahy Scale: Good     Standing balance support: Bilateral upper extremity supported, During functional activity, Reliant on assistive device for balance Standing balance-Leahy Scale: Fair Standing balance comment: improving but still needs +1 for mobility                            Communication Communication Communication: Impaired Factors Affecting Communication: Hearing impaired  Cognition Arousal:  Alert Behavior During Therapy: [facility] for tasks assessed/performed                             Following commands: Intact      Cueing Cueing Techniques: Verbal cues, Tactile cues  Exercises Other Exercises Other Exercises: supine and standing AROM, BSC to void    General Comments        Pertinent Vitals/Pain Pain Assessment Pain  Assessment: Faces Faces Pain Scale: Hurts little more Pain Location: R hip, pelvis Pain Descriptors / Indicators: Aching, Discomfort, Grimacing, Guarding Pain Intervention(s): Limited activity within patient's tolerance, Monitored during session, Premedicated before session, Repositioned    Home Living                          Prior Function            PT Goals (current goals can now be found in the care plan section) Progress towards PT goals: Progressing toward goals    Frequency    7X/week      PT Plan      Co-evaluation              AM-PAC PT 6 Clicks Mobility   Outcome Measure  Help needed turning from your back to your side while in a flat bed without using bedrails?: A Little Help needed moving from lying on your back to sitting on the side of a flat bed without using bedrails?: A Lot Help needed moving to and from a bed to a chair (including a wheelchair)?: A Little Help needed standing up from a chair using your arms (e.g., wheelchair or bedside chair)?: A Little Help needed to walk in hospital room?: A Little Help needed climbing 3-5 steps with a railing? : Total 6 Click Score: 15    End of Session Equipment Utilized During Treatment: Right knee immobilizer;Gait belt Activity Tolerance: Patient tolerated treatment well;Patient limited by pain Patient left: in chair;with call bell/phone within reach;with chair alarm set Nurse Communication: Mobility status PT Visit Diagnosis: Unsteadiness on feet (R26.81);Other abnormalities of gait and mobility (R26.89);Repeated falls (R29.6);Muscle weakness (generalized) (M62.81);History of falling (Z91.81)     Time: 9097-9076 PT Time Calculation (min) (ACUTE ONLY): 21 min  Charges:    $Gait Training: 8-22 mins PT General Charges $$ ACUTE PT VISIT: 1 Visit                   Lauraine Gills, PTA 12/04/23, 10:13 AM

## 2023-12-04 NOTE — Progress Notes (Signed)
  Progress Note   Patient: Sandra [name] FMW:[id number] DOB: 06/01/1945 DOA: 11/26/2023     8 DOS: the patient was seen and examined on 12/04/2023     Brief hospital course:  HPI: Sandra [name] is a 78 y.o. female with medical history significant for left hip fracture s/p repair in march of this year, presenting with trip and fall at home with right hip pain.    06/30: admitted for R femoral neck fracture, pubic rami fractures. Plan OR tomorrow 07/01  R hip hemiarthroplasty. Eliquis  held pending ortho recs.      Assessment and Plan:    Right femoral neck fracture Pubic rami fractures S/p mechanical fall at home. Also with right wrist pain, imaging of right knee, right ankle, and right wrist are negative Orthopedic surgery following --> underwent repair on 11/27/2023 Medically optimized for surgery  Continue pain control Continue bowel regimen Continue PT OT who is recommending skilled nursing facility Patient has made the choice of facility currently awaiting insurance prior authorization   Chronic A-fib S/p watchman device, cardioversion April of this year. Sinus rhythm currently Continue amiodarone  Continue metoprolol  Holding eliquis  peri-operatively   Essential HTN  Hydralazine , losartan  Continue metoprolol  GAD Continue home xanax , sertraline    Hypothyroid Continue synthroid    Hypokalemia Replace as needed Monitor BMP       Code Status: FULL CODE   TOC needs: Pending skilled nursing facility   Family Communication: family at bedside on rounds      Subjective:  Patient seen and examined She denies nausea vomiting abdominal pain chest pain TOC manager working on English as a second language teacher   Physical Exam:   Constitutional:      Appearance: She is not toxic-appearing.    Cardiovascular:     Rate and Rhythm: Normal rate and regular rhythm.  Pulmonary:     Effort: Pulmonary effort is normal.     Breath sounds: Normal breath sounds.  Abdominal:      Palpations: Abdomen is soft.    Musculoskeletal:     Right lower leg: No edema.     Left lower leg: No edema.    Neurological:     General: No focal deficit present.     Mental Status: She is alert and oriented to person, place, and time.    Psychiatric:        Mood and Affect: Mood normal.        Behavior: Behavior normal.      Data Reviewed:  Vitals:   12/03/23 2122 12/04/23 0511 12/04/23 0747 12/04/23 1522  BP: (!) 121/54 128/62 137/67 (!) 116/54  Pulse: 62 64 65 63  Resp:  15 16 16   Temp:  98.2 F (36.8 C) 97.9 F (36.6 C) 97.7 F (36.5 C)  TempSrc:   Oral Oral  SpO2:  96% 96% 97%  Weight:      Height:          Latest Ref Rng & Units 11/30/2023    3:17 AM 11/29/2023    5:36 AM 11/28/2023    4:48 AM  CBC  WBC 4.0 - 10.5 K/uL 5.2  6.3  8.4   Hemoglobin 12.0 - 15.0 g/dL 8.9  9.0  9.4   Hematocrit 36.0 - 46.0 % 27.7  27.3  29.3   Platelets 150 - 400 K/uL 206  192  184      Author: Drue ONEIDA Potter, MD 12/04/2023 6:26 PM  For on call review [email or web address].

## 2023-12-04 NOTE — Plan of Care (Signed)
   Problem: Education: Goal: Knowledge of General Education information will improve Description Including pain rating scale, medication(s)/side effects and non-pharmacologic comfort measures Outcome: Progressing   Problem: Health Behavior/Discharge Planning: Goal: Ability to manage health-related needs will improve Outcome: Progressing

## 2023-12-05 DIAGNOSIS — S72001A Fracture of unspecified part of neck of right femur, initial encounter for closed fracture: Secondary | ICD-10-CM | POA: Diagnosis not present

## 2023-12-05 NOTE — TOC Progression Note (Addendum)
 Transition of Care [facility]) - Progression Note    Patient Details  Name: Sandra [name] MRN: [id number] Date of Birth: Mar 02, 1946  Transition of Care [facility] CM/SW Contact  [place] Maleia Weems, [place] Phone Number: 12/05/2023, 10:36 AM  Clinical Narrative:     CSW spoke with [facility]. Patient insurance denied admission.   CSW to follow-up with patient on other offers.   10:50am: CSW spoke with patient at bedside. Advised of auth decline for [facility]. List provided of accepted facilities. Patient advised that she would like to go to [facility]. CSW will start auth.   10:58am: CSW spoke with Taya at [facility] to determine if there is bed availability. Taya will start auth as patient has [place].   11:40: Call received from Tammy at [facility]. [employer] denied the auth for patient to go to SNF. Family will have to call the insurance to appeal decision. CSW contacted patient daughter and provided this information to include the number to call [phone].   Expected Discharge Plan: Skilled Nursing Facility Barriers to Discharge: Continued Medical Work up  Expected Discharge Plan and Services   Discharge Planning Services: CM Consult Post Acute Care Choice: Skilled Nursing Facility Living arrangements for the past 2 months: Single Family Home                   DME Agency: NA       HH Arranged: NA           Social Determinants of Health (SDOH) Interventions SDOH Screenings   Food Insecurity: No Food Insecurity (11/27/2023)  Housing: High Risk (11/27/2023)  Transportation Needs: No Transportation Needs (11/26/2023)  Recent Concern: Transportation Needs - Unmet Transportation Needs (09/10/2023)  Utilities: Not At Risk (11/26/2023)  Recent Concern: Utilities - At Risk (09/10/2023)  Alcohol  Screen: Low Risk  (06/22/2021)  Depression (PHQ2-9): Low Risk  (06/22/2021)  Financial Resource Strain: Low Risk  (05/18/2021)  Physical Activity: Insufficiently Active  (05/18/2021)  Social Connections: Unknown (11/27/2023)  Stress: No Stress Concern Present (05/18/2021)  Tobacco Use: Low Risk  (11/27/2023)    Readmission Risk Interventions     No data to display

## 2023-12-05 NOTE — Progress Notes (Signed)
 PROGRESS NOTE    Sandra [name]   FMW:[id number] DOB: 1945/11/29  DOA: 11/26/2023 Date of Service: 12/05/23 which is hospital day 9  PCP: Sampson Ethridge LABOR, MD    Hospital course / significant events:   HPI: Sandra [name] is a 78 y.o. female with medical history significant for left hip fracture s/p repair in march of this year, presenting with trip and fall at home with right hip pain.   06/30: admitted for R femoral neck fracture, pubic rami fractures. Plan OR tomorrow 07/01  R hip hemiarthroplasty. Eliquis  held pending ortho recs.  07/02-07/09: pending placement   Consultants:  Orthopedic surgery   Procedures/Surgeries: 11/27/23 R hip hemiarthroplasty - Dr GORMAN Blanch      ASSESSMENT & PLAN:   Right femoral neck fracture Pubic rami fractures S/p mechanical fall at home. Also with right wrist pain, imaging of right knee, right ankle, and right wrist are negative S/p 11/27/23 R hip hemiarthroplasty   Orthopedic surgery following  Pain control Bowel regimen PT/OT  SNF rehab placement pending    A-fib S/p watchman device, cardioversion April of this year. Sinus rhythm currently cont home amiodarone , metoprolol   Held eliquis  peri-operaively, resume per ortho   Essential HTN  Hydralazine , losartan , metoprolol    GAD home xanax , sertraline    Hypothyroid home synthroid    Hypokalemia Replace as needed Monitor BMP       No concerns based on BMI: Body mass index is 22.14 kg/m.[name] Significantly low or high BMI is associated with higher medical risk.  Underweight - under 18  overweight - 25 to 29 obese - 30 or more Class 1 obesity: BMI of 30.0 to 34 Class 2 obesity: BMI of 35.0 to 39 Class 3 obesity: BMI of 40.0 to 49 Super Morbid Obesity: BMI 50-59 Super-super Morbid Obesity: BMI 60+ Healthy nutrition and physical activity advised as adjunct to other disease management and risk reduction treatments    DVT prophylaxis: holding home eliquis , can  likely restart this per ortho  IV fluids: no continuous IV fluids  Nutrition: regular diet once out of OR Central lines / other devices: none  Code Status: FULL CODE ACP documentation reviewed:  none on file in [facility]  [facility] needs: SNF Medical barriers to dispo: noe=ne             Subjective / Brief ROS:  Patient reports concern about placement vs home  Denies CP/SOB.  Pain controlled.  Denies new weakness.  Tolerating diet.  Reports no concerns w/ urination/defecation.   Family Communication: none at this time    Objective Findings:  Vitals:   12/04/23 1951 12/05/23 0411 12/05/23 0741 12/05/23 1449  BP: 130/60 117/62 121/61 (!) 108/43  Pulse: 62 62 (!) 59 (!) 59  Resp: 16 16 17 17   Temp: 98 F (36.7 C) (!) 97.5 F (36.4 C) 97.9 F (36.6 C) 98.1 F (36.7 C)  TempSrc: Oral     SpO2: 95% 94% 97% 98%  Weight:      Height:        Intake/Output Summary (Last 24 hours) at 12/05/2023 1739 Last data filed at 12/05/2023 1431 Gross per 24 hour  Intake 240 ml  Output 700 ml  Net -460 ml   Filed Weights   11/27/23 0035  Weight: 56.7 kg    Examination:  Physical Exam Constitutional:      General: She is not in acute distress. Cardiovascular:     Rate and Rhythm: Normal rate and regular rhythm.  Pulmonary:  Effort: Pulmonary effort is normal.     Breath sounds: Normal breath sounds.  Neurological:     General: No focal deficit present.     Mental Status: She is alert and oriented to person, place, and time.  Psychiatric:        Mood and Affect: Mood normal.        Behavior: Behavior normal.          Scheduled Medications:   acetaminophen   1,000 mg Oral Q8H   amiodarone   100 mg Oral Daily   atorvastatin   40 mg Oral Daily   brimonidine   1 drop Both Eyes BID   docusate sodium   100 mg Oral BID   enoxaparin  (LOVENOX ) injection  40 mg Subcutaneous Q24H   latanoprost   1 drop Both Eyes QHS   levothyroxine   100 mcg Oral Q0600   losartan   75 mg Oral  Daily   pantoprazole   40 mg Oral Daily   polyethylene glycol  17 g Oral Daily   potassium chloride   40 mEq Oral Daily   senna  1 tablet Oral BID   sertraline   100 mg Oral Daily    Continuous Infusions:   PRN Medications:  ALPRAZolam , bisacodyl , hydrALAZINE , HYDROmorphone  (DILAUDID ) injection, menthol -cetylpyridinium **OR** phenol, methocarbamol , metoCLOPramide  **OR** metoCLOPramide  (REGLAN ) injection, metoprolol  tartrate, ondansetron  **OR** ondansetron  (ZOFRAN ) IV, mouth rinse, mouth rinse, oxyCODONE , oxyCODONE , senna-docusate, sodium phosphate , traMADol , zolpidem   Antimicrobials from admission:  Anti-infectives (From admission, onward)    Start     Dose/Rate Route Frequency Ordered Stop   11/27/23 2030  ceFAZolin  (ANCEF ) IVPB 1 g/50 mL premix        1 g 100 mL/hr over 30 Minutes Intravenous Every 6 hours 11/27/23 1710 11/28/23 0400   11/27/23 1200  ceFAZolin  (ANCEF ) IVPB 2g/100 mL premix        2 g 200 mL/hr over 30 Minutes Intravenous  Once 11/26/23 1258 11/27/23 1421           Data Reviewed:  I have personally reviewed the following...  CBC: Recent Labs  Lab 11/29/23 0536 11/30/23 0317  WBC 6.3 5.2  HGB 9.0* 8.9*  HCT 27.3* 27.7*  MCV 91.0 92.6  PLT 192 206   Basic Metabolic Panel: Recent Labs  Lab 11/29/23 0536 11/30/23 0317  NA 136 137  K 4.6 4.6  CL 107 107  CO2 24 22  GLUCOSE 113* 103*  BUN 19 17  CREATININE 0.64 0.45  CALCIUM  8.4* 8.3*   GFR: Estimated Creatinine Clearance: 47.9 mL/min (by C-G formula based on SCr of 0.45 mg/dL). Liver Function Tests: No results for input(s): AST, ALT, ALKPHOS, BILITOT, PROT, ALBUMIN  in the last 168 hours. No results for input(s): LIPASE, AMYLASE in the last 168 hours. No results for input(s): AMMONIA in the last 168 hours. Coagulation Profile: No results for input(s): INR, PROTIME in the last 168 hours. Cardiac Enzymes: No results for input(s): CKTOTAL, CKMB, CKMBINDEX,  TROPONINI in the last 168 hours. BNP (last 3 results) No results for input(s): PROBNP in the last 8760 hours. HbA1C: No results for input(s): HGBA1C in the last 72 hours. CBG: No results for input(s): GLUCAP in the last 168 hours. Lipid Profile: No results for input(s): CHOL, HDL, LDLCALC, TRIG, CHOLHDL, LDLDIRECT in the last 72 hours. Thyroid  Function Tests: No results for input(s): TSH, T4TOTAL, FREET4, T3FREE, THYROIDAB in the last 72 hours. Anemia Panel: No results for input(s): VITAMINB12, FOLATE, FERRITIN, TIBC, IRON , RETICCTPCT in the last 72 hours. Most Recent Urinalysis On File:  Component Value Date/Time   COLORURINE STRAW (A) 09/08/2023 1531   APPEARANCEUR CLEAR (A) 09/08/2023 1531   LABSPEC 1.018 09/08/2023 1531   PHURINE 7.0 09/08/2023 1531   GLUCOSEU NEGATIVE 09/08/2023 1531   HGBUR NEGATIVE 09/08/2023 1531   BILIRUBINUR NEGATIVE 09/08/2023 1531   BILIRUBINUR Negative 11/24/2019 1352   KETONESUR NEGATIVE 09/08/2023 1531   PROTEINUR NEGATIVE 09/08/2023 1531   UROBILINOGEN 0.2 11/24/2019 1352   NITRITE NEGATIVE 09/08/2023 1531   LEUKOCYTESUR NEGATIVE 09/08/2023 1531   Sepsis Labs: @LABRCNTIP (procalcitonin:4,lacticidven:4) Microbiology: Recent Results (from the past 240 hours)  Surgical PCR screen     Status: None   Collection Time: 11/26/23  9:49 PM   Specimen: Nasal Mucosa; Nasal Swab  Result Value Ref Range Status   MRSA, PCR NEGATIVE NEGATIVE Final   Staphylococcus aureus NEGATIVE NEGATIVE Final    Comment: (NOTE) The Xpert SA Assay (FDA approved for NASAL specimens in patients 37 years of age and older), is one component of a comprehensive surveillance program. It is not intended to diagnose infection nor to guide or monitor treatment. Performed at [facility], [address] [place], [place] [address]       Radiology Studies last 3 days: CUP PACEART REMOTE DEVICE CHECK Result Date:  12/03/2023 ILR summary report received. Battery status OK. Normal device function. No new symptom, tachy, brady, or pause episodes. No new AF episodes. Monthly summary reports and ROV/PRN. MC, CVRS         Sandra Blunt, DO [facility] 12/05/2023, 5:39 PM    Dictation software may have been used to generate the above note. Typos may occur and escape review in typed/dictated notes. Please contact Dr [name] directly for clarity if needed.  Staff may message me via secure chat in Epic  but this may not receive an immediate response,  please page me for urgent matters!  If 7PM-7AM, please contact night coverage [email or web address]

## 2023-12-05 NOTE — Plan of Care (Signed)
   Problem: Education: Goal: Knowledge of General Education information will improve Description: Including pain rating scale, medication(s)/side effects and non-pharmacologic comfort measures Outcome: Progressing   Problem: Activity: Goal: Risk for activity intolerance will decrease Outcome: Progressing   Problem: Nutrition: Goal: Adequate nutrition will be maintained Outcome: Progressing

## 2023-12-05 NOTE — Progress Notes (Signed)
 Physical Therapy Treatment Patient Details Name: Sandra [name] MRN: [id number] DOB: 1946/04/17 Today's Date: 12/05/2023   History of Present Illness Pt is a 78 year old who presented with a R femoral neck fracture + pubic rami fx after a fall s/p R hemiarthroplasty, posterior approach 11/27/23. PMH significant for previous left hip replacement, paroxysmal atrial fibrillation not on anticoagulation, heart failure, previous stroke    PT Comments  Pt continues to participate well in skilled PT session, however is not close to baseline level of function and requires  ModA for bed mobility and transfers at this time. Pt's gait tolerance is limited with RW due to pain from hip and pelvic fracture. Pt continues to be an excellent candidate for STR at d/c. Will attempt to progress gait this pm. Pt left up in chair with all needs in reach.    If plan is discharge home, recommend the following: A lot of help with walking and/or transfers;A lot of help with bathing/dressing/bathroom;Assist for transportation;Help with stairs or ramp for entrance   Can travel by private vehicle     No  Equipment Recommendations  Other (comment) (Defer to next level of care)    Recommendations for Other Services       Precautions / Restrictions Precautions Precautions: Posterior Hip;Fall Precaution Booklet Issued: Yes (comment) (Reviewed with pt precautions and donning of KI for hygiene and during PT/OT) Recall of Precautions/Restrictions: Intact Precaution/Restrictions Comments: review of posterior hip precautions Required Braces or Orthoses: Knee Immobilizer - Right Knee Immobilizer - Right: Other (comment) (Off for in bed, hygiene and during PT/OT per 11/27/23 Order) Restrictions Weight Bearing Restrictions Per Provider Order: Yes RLE Weight Bearing Per Provider Order: Weight bearing as tolerated     Mobility  Bed Mobility Overal bed mobility: Needs Assistance Bed Mobility: Supine to Sit     Supine to  sit: Mod assist, HOB elevated, Used rails     General bed mobility comments: improved today but still needs assist    Transfers Overall transfer level: Needs assistance Equipment used: Rolling walker (2 wheels) Transfers: Sit to/from Stand Sit to Stand: Min assist           General transfer comment: improved today with better hand placements and ability to stand    Ambulation/Gait Ambulation/Gait assistance: Min assist Gait Distance (Feet): 4 Feet Assistive device: Rolling walker (2 wheels) Gait Pattern/deviations: Step-to pattern, Antalgic, Decreased stance time - left, Shuffle Gait velocity: decreased     General Gait Details: shuffling gait to chair   Stairs Stairs: Yes       General stair comments:  (Steps to enter home)   Wheelchair Mobility     Tilt Bed    Modified Rankin (Stroke Patients Only)       Balance Overall balance assessment: Needs assistance Sitting-balance support: Bilateral upper extremity supported, Feet supported Sitting balance-Leahy Scale: Good     Standing balance support: Bilateral upper extremity supported, During functional activity, Reliant on assistive device for balance Standing balance-Leahy Scale: Fair Standing balance comment: improving but still needs +1 for mobility                            Communication Communication Communication: Impaired Factors Affecting Communication: Hearing impaired  Cognition Arousal: Alert Behavior During Therapy: WFL for tasks assessed/performed   PT - Cognitive impairments: No apparent impairments  PT - Cognition Comments: pleasant and cooperative Following commands: Intact      Cueing Cueing Techniques: Verbal cues, Tactile cues  Exercises Total Joint Exercises Ankle Circles/Pumps: AROM, Both, 10 reps Other Exercises Other Exercises: Pt educated on role of PT and benefits of STR    General Comments General comments (skin  integrity, edema, etc.):  (Right hip dressing intact)      Pertinent Vitals/Pain Pain Assessment Pain Assessment: Faces Faces Pain Scale: Hurts little more Pain Location: R hip, pelvis Pain Descriptors / Indicators: Aching, Discomfort, Grimacing, Guarding Pain Intervention(s): Limited activity within patient's tolerance    Home Living                          Prior Function            PT Goals (current goals can now be found in the care plan section) Acute Rehab PT Goals Patient Stated Goal: decrease pain    Frequency    BID      PT Plan      Co-evaluation              AM-PAC PT 6 Clicks Mobility   Outcome Measure  Help needed turning from your back to your side while in a flat bed without using bedrails?: A Lot Help needed moving from lying on your back to sitting on the side of a flat bed without using bedrails?: A Lot Help needed moving to and from a bed to a chair (including a wheelchair)?: A Little Help needed standing up from a chair using your arms (e.g., wheelchair or bedside chair)?: A Little Help needed to walk in hospital room?: A Lot Help needed climbing 3-5 steps with a railing? : Total 6 Click Score: 13    End of Session Equipment Utilized During Treatment: Right knee immobilizer;Gait belt Activity Tolerance: Patient tolerated treatment well;Patient limited by pain Patient left: in chair;with call bell/phone within reach;with chair alarm set Nurse Communication: Mobility status PT Visit Diagnosis: Unsteadiness on feet (R26.81);Other abnormalities of gait and mobility (R26.89);Repeated falls (R29.6);Muscle weakness (generalized) (M62.81);History of falling (Z91.81)     Time: 8776-8749 PT Time Calculation (min) (ACUTE ONLY): 27 min  Charges:    $Therapeutic Activity: 23-37 mins PT General Charges $$ ACUTE PT VISIT: 1 Visit                    Darice Bohr, PTA  Darice JAYSON Bohr 12/05/2023, 2:43 PM

## 2023-12-06 DIAGNOSIS — S72001A Fracture of unspecified part of neck of right femur, initial encounter for closed fracture: Secondary | ICD-10-CM | POA: Diagnosis not present

## 2023-12-06 NOTE — TOC Progression Note (Signed)
 Transition of Care [facility]) - Progression Note    Patient Details  Name: Sandra [name] MRN: [id number] Date of Birth: Apr 17, 1946  [facility] CM/SW Contact  [place] Kelly [place], [place] Phone Number: 12/06/2023, 3:37 PM  Clinical Narrative:     CSW contacted POA to follow-up with the appeal process. CSW left a message  Expected Discharge Plan: Skilled Nursing Facility Barriers to Discharge: Continued Medical Work up  Expected Discharge Plan and Services   Discharge Planning Services: CM Consult Post Acute Care Choice: Skilled Nursing Facility Living arrangements for the past 2 months: Single Family Home                   DME Agency: NA       HH Arranged: NA           Social Determinants of Health (SDOH) Interventions SDOH Screenings   Food Insecurity: No Food Insecurity (11/27/2023)  Housing: High Risk (11/27/2023)  Transportation Needs: No Transportation Needs (11/26/2023)  Recent Concern: Transportation Needs - Unmet Transportation Needs (09/10/2023)  Utilities: Not At Risk (11/26/2023)  Recent Concern: Utilities - At Risk (09/10/2023)  Alcohol  Screen: Low Risk  (06/22/2021)  Depression (PHQ2-9): Low Risk  (06/22/2021)  Financial Resource Strain: Low Risk  (05/18/2021)  Physical Activity: Insufficiently Active (05/18/2021)  Social Connections: Unknown (11/27/2023)  Stress: No Stress Concern Present (05/18/2021)  Tobacco Use: Low Risk  (11/27/2023)    Readmission Risk Interventions     No data to display

## 2023-12-06 NOTE — Progress Notes (Signed)
 Physical Therapy Treatment Patient Details Name: Sandra [name] MRN: [id number] DOB: 09-03-1945 Today's Date: 12/06/2023   History of Present Illness Pt is a 78 year old who presented with a R femoral neck fracture + pubic rami fx after a fall s/p R hemiarthroplasty, posterior approach 11/27/23. PMH significant for previous left hip replacement, paroxysmal atrial fibrillation not on anticoagulation, heart failure, previous stroke    PT Comments  Increased time spent with pt this am to progress functional mobility, gait, and education regarding THP and donning purpose of knee immobilizer per Dr. Tobie. KI to be on when pt in bed. Improved transfers this date requiring CG/MinA with good technique. Gait remains slow with RW and guarded primarily due to pain from pelvic fx and lack of pain meds - will premedicate for pm session. Pt is not quite at a safe functional level to d/c home with disabled husband at home. Will plan to continue PT progression, hopeful insurance appeal for STR as pt will greatly benefit at this point.    If plan is discharge home, recommend the following: A lot of help with walking and/or transfers;A lot of help with bathing/dressing/bathroom;Assist for transportation;Help with stairs or ramp for entrance   Can travel by private vehicle     No  Equipment Recommendations  Other (comment) (Defer to next level of care)    Recommendations for Other Services       Precautions / Restrictions Precautions Precautions: Posterior Hip;Fall Precaution Booklet Issued: Yes (comment) (Reviewed precautions and packet good recall of THP and KI wear time) Recall of Precautions/Restrictions: Intact Precaution/Restrictions Comments: Able to recall 3:3 precautions Required Braces or Orthoses: Knee Immobilizer - Right Knee Immobilizer - Right:  (Knee Immobilizer off when out of bed per secure chat with Dr. Tobie 12/06/23) Restrictions Weight Bearing Restrictions Per Provider Order: Yes RLE  Weight Bearing Per Provider Order: Weight bearing as tolerated     Mobility  Bed Mobility Overal bed mobility: Needs Assistance Bed Mobility: Supine to Sit     Supine to sit: Mod assist, HOB elevated, Used rails Sit to supine: Mod assist, Used rails (For B LE's)        Transfers Overall transfer level: Needs assistance Equipment used: Rolling walker (2 wheels) Transfers: Sit to/from Stand Sit to Stand: Min assist           General transfer comment: improved today with better hand placements and ability to stand    Ambulation/Gait Ambulation/Gait assistance: Contact guard assist Gait Distance (Feet): 40 Feet Assistive device: Rolling walker (2 wheels) Gait Pattern/deviations: Step-to pattern, Antalgic, Decreased stance time - left Gait velocity: decreased     General Gait Details: Heavy reliance on RW to offload pain, slow cadence   Stairs Stairs: Yes       General stair comments:  (Pt has 2 steps to enter with single rail - not attempted)   Wheelchair Mobility     Tilt Bed    Modified Rankin (Stroke Patients Only)       Balance Overall balance assessment: Needs assistance Sitting-balance support: Bilateral upper extremity supported, Feet supported Sitting balance-Leahy Scale: Good     Standing balance support: Bilateral upper extremity supported, During functional activity, Reliant on assistive device for balance Standing balance-Leahy Scale: Fair Standing balance comment: improving but still needs +1 for mobility                            Communication Communication Communication: Impaired  Factors Affecting Communication: Hearing impaired  Cognition Arousal: Alert Behavior During Therapy: WFL for tasks assessed/performed   PT - Cognitive impairments: No apparent impairments                       PT - Cognition Comments: pleasant and cooperative Following commands: Intact      Cueing Cueing Techniques: Verbal  cues, Tactile cues  Exercises Total Joint Exercises Ankle Circles/Pumps: AROM, Both, 10 reps Long Arc Quad: AROM, Both, 10 reps, Seated Other Exercises Other Exercises: Pt educated on wear time for KI, hip precautions, safe mobility, and pain control    General Comments General comments (skin integrity, edema, etc.):  (L hip dressing dry and intact)      Pertinent Vitals/Pain Pain Assessment Pain Assessment: 0-10 Pain Score: 9  Pain Location: R hip, pelvis Pain Descriptors / Indicators: Aching, Discomfort, Grimacing, Guarding Pain Intervention(s): Monitored during session, Ice applied    Home Living                          Prior Function            PT Goals (current goals can now be found in the care plan section) Acute Rehab PT Goals Patient Stated Goal: decrease pain Progress towards PT goals: Progressing toward goals    Frequency    BID      PT Plan      Co-evaluation              AM-PAC PT 6 Clicks Mobility   Outcome Measure  Help needed turning from your back to your side while in a flat bed without using bedrails?: A Lot Help needed moving from lying on your back to sitting on the side of a flat bed without using bedrails?: A Lot Help needed moving to and from a bed to a chair (including a wheelchair)?: A Little Help needed standing up from a chair using your arms (e.g., wheelchair or bedside chair)?: A Little Help needed to walk in hospital room?: A Little Help needed climbing 3-5 steps with a railing? : Total 6 Click Score: 14    End of Session Equipment Utilized During Treatment: Gait belt Activity Tolerance: Patient tolerated treatment well;Patient limited by pain Patient left: in bed;with call bell/phone within reach;with bed alarm set;with SCD's reapplied Nurse Communication: Mobility status PT Visit Diagnosis: Unsteadiness on feet (R26.81);Other abnormalities of gait and mobility (R26.89);Repeated falls (R29.6);Muscle weakness  (generalized) (M62.81);History of falling (Z91.81)     Time: 8943-8849 PT Time Calculation (min) (ACUTE ONLY): 54 min  Charges:    $Gait Training: 8-22 mins $Therapeutic Exercise: 8-22 mins $Therapeutic Activity: 23-37 mins PT General Charges $$ ACUTE PT VISIT: 1 Visit                    Darice Bohr, PTA  Darice JAYSON Bohr 12/06/2023, 1:26 PM

## 2023-12-06 NOTE — Progress Notes (Signed)
 PROGRESS NOTE    Sandra [name]   FMW:[id number] DOB: 1945/07/30  DOA: 11/26/2023 Date of Service: 12/06/23 which is hospital day 10  PCP: Sampson Ethridge LABOR, MD    Hospital course / significant events:   HPI: Sandra [name] is a 78 y.o. female with medical history significant for left hip fracture s/p repair in march of this year, presenting with trip and fall at home with right hip pain.   06/30: admitted for R femoral neck fracture, pubic rami fractures. Plan OR tomorrow 07/01  R hip hemiarthroplasty. Eliquis  held pending ortho recs.  07/02-07/09: pending placement 07/10: appealing SNF denial    Consultants:  Orthopedic surgery   Procedures/Surgeries: 11/27/23 R hip hemiarthroplasty - Dr GORMAN Blanch      ASSESSMENT & PLAN:   Right femoral neck fracture Pubic rami fractures S/p mechanical fall at home. Also with right wrist pain, imaging of right knee, right ankle, and right wrist are negative S/p 11/27/23 R hip hemiarthroplasty   Orthopedic surgery following  Pain control Bowel regimen PT/OT  SNF rehab placement pending - appeal in process    A-fib S/p watchman device, cardioversion April of this year. Sinus rhythm currently cont home amiodarone , metoprolol   Held eliquis  peri-operaively, resume per ortho   Essential HTN  Hydralazine , losartan , metoprolol    GAD home xanax , sertraline    Hypothyroid home synthroid    Hypokalemia Replace as needed Monitor BMP       No concerns based on BMI: Body mass index is 22.14 kg/m.[name] Significantly low or high BMI is associated with higher medical risk.  Underweight - under 18  overweight - 25 to 29 obese - 30 or more Class 1 obesity: BMI of 30.0 to 34 Class 2 obesity: BMI of 35.0 to 39 Class 3 obesity: BMI of 40.0 to 49 Super Morbid Obesity: BMI 50-59 Super-super Morbid Obesity: BMI 60+ Healthy nutrition and physical activity advised as adjunct to other disease management and risk reduction  treatments    DVT prophylaxis: holding home eliquis , can likely restart this per ortho  IV fluids: no continuous IV fluids  Nutrition: regular diet once out of OR Central lines / other devices: none  Code Status: FULL CODE ACP documentation reviewed:  none on file in [facility]  TOC needs: SNF Medical barriers to dispo: noe=ne             Subjective / Brief ROS:  No new concerns today  Family Communication: none at this time    Objective Findings:  Vitals:   12/05/23 1449 12/05/23 2016 12/06/23 0456 12/06/23 0855  BP: (!) 108/43 (!) 123/58 (!) 120/59 (!) 141/61  Pulse: (!) 59 (!) 58 (!) 57 63  Resp: 17 14 18 17   Temp: 98.1 F (36.7 C) 97.6 F (36.4 C) 98 F (36.7 C) 98.6 F (37 C)  TempSrc:      SpO2: 98% 98% 98% 99%  Weight:      Height:        Intake/Output Summary (Last 24 hours) at 12/06/2023 1315 Last data filed at 12/05/2023 1431 Gross per 24 hour  Intake 240 ml  Output --  Net 240 ml   Filed Weights   11/27/23 0035  Weight: 56.7 kg    Examination:  Physical Exam Constitutional:      General: She is not in acute distress. Pulmonary:     Effort: Pulmonary effort is normal.  Neurological:     Mental Status: She is alert.  Psychiatric:  Behavior: Behavior normal.          Scheduled Medications:   acetaminophen   1,000 mg Oral Q8H   amiodarone   100 mg Oral Daily   atorvastatin   40 mg Oral Daily   brimonidine   1 drop Both Eyes BID   docusate sodium   100 mg Oral BID   enoxaparin  (LOVENOX ) injection  40 mg Subcutaneous Q24H   latanoprost   1 drop Both Eyes QHS   levothyroxine   100 mcg Oral Q0600   losartan   75 mg Oral Daily   pantoprazole   40 mg Oral Daily   polyethylene glycol  17 g Oral Daily   potassium chloride   40 mEq Oral Daily   senna  1 tablet Oral BID   sertraline   100 mg Oral Daily    Continuous Infusions:   PRN Medications:  ALPRAZolam , bisacodyl , hydrALAZINE , HYDROmorphone  (DILAUDID ) injection,  menthol -cetylpyridinium **OR** phenol, methocarbamol , metoCLOPramide  **OR** metoCLOPramide  (REGLAN ) injection, metoprolol  tartrate, ondansetron  **OR** ondansetron  (ZOFRAN ) IV, mouth rinse, mouth rinse, oxyCODONE , oxyCODONE , senna-docusate, sodium phosphate , traMADol , zolpidem   Antimicrobials from admission:  Anti-infectives (From admission, onward)    Start     Dose/Rate Route Frequency Ordered Stop   11/27/23 2030  ceFAZolin  (ANCEF ) IVPB 1 g/50 mL premix        1 g 100 mL/hr over 30 Minutes Intravenous Every 6 hours 11/27/23 1710 11/28/23 0400   11/27/23 1200  ceFAZolin  (ANCEF ) IVPB 2g/100 mL premix        2 g 200 mL/hr over 30 Minutes Intravenous  Once 11/26/23 1258 11/27/23 1421           Data Reviewed:  I have personally reviewed the following...  CBC: Recent Labs  Lab 11/30/23 0317  WBC 5.2  HGB 8.9*  HCT 27.7*  MCV 92.6  PLT 206   Basic Metabolic Panel: Recent Labs  Lab 11/30/23 0317  NA 137  K 4.6  CL 107  CO2 22  GLUCOSE 103*  BUN 17  CREATININE 0.45  CALCIUM  8.3*   GFR: Estimated Creatinine Clearance: 47.9 mL/min (by C-G formula based on SCr of 0.45 mg/dL). Liver Function Tests: No results for input(s): AST, ALT, ALKPHOS, BILITOT, PROT, ALBUMIN  in the last 168 hours. No results for input(s): LIPASE, AMYLASE in the last 168 hours. No results for input(s): AMMONIA in the last 168 hours. Coagulation Profile: No results for input(s): INR, PROTIME in the last 168 hours. Cardiac Enzymes: No results for input(s): CKTOTAL, CKMB, CKMBINDEX, TROPONINI in the last 168 hours. BNP (last 3 results) No results for input(s): PROBNP in the last 8760 hours. HbA1C: No results for input(s): HGBA1C in the last 72 hours. CBG: No results for input(s): GLUCAP in the last 168 hours. Lipid Profile: No results for input(s): CHOL, HDL, LDLCALC, TRIG, CHOLHDL, LDLDIRECT in the last 72 hours. Thyroid  Function Tests: No  results for input(s): TSH, T4TOTAL, FREET4, T3FREE, THYROIDAB in the last 72 hours. Anemia Panel: No results for input(s): VITAMINB12, FOLATE, FERRITIN, TIBC, IRON , RETICCTPCT in the last 72 hours. Most Recent Urinalysis On File:     Component Value Date/Time   COLORURINE STRAW (A) 09/08/2023 1531   APPEARANCEUR CLEAR (A) 09/08/2023 1531   LABSPEC 1.018 09/08/2023 1531   PHURINE 7.0 09/08/2023 1531   GLUCOSEU NEGATIVE 09/08/2023 1531   HGBUR NEGATIVE 09/08/2023 1531   BILIRUBINUR NEGATIVE 09/08/2023 1531   BILIRUBINUR Negative 11/24/2019 1352   KETONESUR NEGATIVE 09/08/2023 1531   PROTEINUR NEGATIVE 09/08/2023 1531   UROBILINOGEN 0.2 11/24/2019 1352   NITRITE NEGATIVE 09/08/2023 1531  LEUKOCYTESUR NEGATIVE 09/08/2023 1531   Sepsis Labs: @LABRCNTIP (procalcitonin:4,lacticidven:4) Microbiology: Recent Results (from the past 240 hours)  Surgical PCR screen     Status: None   Collection Time: 11/26/23  9:49 PM   Specimen: Nasal Mucosa; Nasal Swab  Result Value Ref Range Status   MRSA, PCR NEGATIVE NEGATIVE Final   Staphylococcus aureus NEGATIVE NEGATIVE Final    Comment: (NOTE) The Xpert SA Assay (FDA approved for NASAL specimens in patients 79 years of age and older), is one component of a comprehensive surveillance program. It is not intended to diagnose infection nor to guide or monitor treatment. Performed at [facility], [address], [place], [place] [address]       Radiology Studies last 3 days: CUP PACEART REMOTE DEVICE CHECK Result Date: 12/03/2023 ILR summary report received. Battery status OK. Normal device function. No new symptom, tachy, brady, or pause episodes. No new AF episodes. Monthly summary reports and ROV/PRN. MC, CVRS         Laneta Blunt, DO [facility] 12/06/2023, 1:15 PM    Dictation software may have been used to generate the above note. Typos may occur and escape review in typed/dictated  notes. Please contact Dr Blunt directly for clarity if needed.  Staff may message me via secure chat in Epic  but this may not receive an immediate response,  please page me for urgent matters!  If 7PM-7AM, please contact night coverage [email or web address]

## 2023-12-07 DIAGNOSIS — S72001A Fracture of unspecified part of neck of right femur, initial encounter for closed fracture: Secondary | ICD-10-CM | POA: Diagnosis not present

## 2023-12-07 NOTE — Progress Notes (Signed)
 Occupational Therapy Treatment Patient Details Name: Sandra [name] MRN: [id number] DOB: 09/04/1945 Today's Date: 12/07/2023   History of present illness Pt is a 78 year old who presented with a R femoral neck fracture + pubic rami fx after a fall s/p R hemiarthroplasty, posterior approach 11/27/23. PMH significant for previous left hip replacement, paroxysmal atrial fibrillation not on anticoagulation, heart failure, previous stroke   OT comments  Pt seen for OT tx this date. Session focused on functional transfers, LB dressing using AE, and continued education regarding posterior hip precautions and implications for functional task performance. Pt improving with overall mobility with pain meds given prior to session. Pt requires MIN A for bed mobility, MIN A fading to CGA for functional STS and step pivot transfers from bed <> BSC using RW, and MIN A for pericare / LB dressing. Pt will continue to require reinforcement for posterior hip precautions, recalls 2/3 end of session. Discharge recommendation remains appropriate. Patient will benefit from continued inpatient follow up therapy, <3 hours/day       If plan is discharge home, recommend the following:  A lot of help with walking and/or transfers;A lot of help with bathing/dressing/bathroom;Help with stairs or ramp for entrance;Assistance with cooking/housework   Equipment Recommendations  Other (comment)       Precautions / Restrictions Precautions Precautions: Posterior Hip;Fall Recall of Precautions/Restrictions: Intact Precaution/Restrictions Comments: Able to recall 1:3 precautions Required Braces or Orthoses: Knee Immobilizer - Right Knee Immobilizer - Right: Other (comment) (on when in bed) Restrictions Weight Bearing Restrictions Per Provider Order: Yes RLE Weight Bearing Per Provider Order: Weight bearing as tolerated       Mobility Bed Mobility Overal bed mobility: Needs Assistance Bed Mobility: Supine to Sit      Supine to sit: Min assist, HOB elevated, Used rails          Transfers Overall transfer level: Needs assistance Equipment used: Rolling walker (2 wheels) Transfers: Sit to/from Stand Sit to Stand: Min assist, Contact guard assist     Step pivot transfers: Min assist, Contact guard assist     General transfer comment: minA - CGA     Balance Overall balance assessment: Needs assistance Sitting-balance support: Bilateral upper extremity supported, Feet supported Sitting balance-Leahy Scale: Good     Standing balance support: Bilateral upper extremity supported, During functional activity, Reliant on assistive device for balance Standing balance-Leahy Scale: Fair Standing balance comment: attempts to perform LB dressing without UE support and is unsteady                           ADL either performed or assessed with clinical judgement   ADL Overall ADL's : Needs assistance/impaired     Grooming: Applying deodorant;Sitting Grooming Details (indicate cue type and reason): sitting EOB Upper Body Bathing: Set up;Sitting Upper Body Bathing Details (indicate cue type and reason): sitting EOB         Lower Body Dressing: Adhering to hip precautions;Sit to/from stand;Minimal assistance;With adaptive equipment Lower Body Dressing Details (indicate cue type and reason): OT threads underwear through feet and brings up to knees while discussing use of AE, pt able to pull underwear up over thighs and STS, pulling pants over hips. requires steadying assist for balance, and max multimodal cuing to unilaterally place hand on RW. OT educates on importance of AD use to adhere to hip precautions given both hip surgeries Toilet Transfer: BSC/3in1;Minimal assistance;Rolling walker (2 wheels);Cueing for safety;Cueing for sequencing  Toilet Transfer Details (indicate cue type and reason): step pivot bed <> BSC with RW Toileting- Clothing Manipulation and Hygiene: Minimal  assistance;Sit to/from stand;Cueing for safety;Cueing for sequencing Toileting - Clothing Manipulation Details (indicate cue type and reason): steadying assist while pt performs pericare in standing     Functional mobility during ADLs: Minimal assistance;Cueing for safety;Cueing for sequencing;Rolling walker (2 wheels) General ADL Comments: pt performing seated UB bathing EOB, transfers t/f BSC > bed > recliner with RW and up to MIN A for steadying assist     Communication Communication Communication: Impaired Factors Affecting Communication: Hearing impaired   Cognition Arousal: Alert Behavior During Therapy: WFL for tasks assessed/performed Cognition: Cognition impaired       Memory impairment (select all impairments): Short-term memory   Executive functioning impairment (select all impairments): Problem solving OT - Cognition Comments: ?understanding of current level of function. requires edu to implement post hip precautions into LB dressing                 Following commands: Intact        Cueing   Cueing Techniques: Verbal cues, Tactile cues  Exercises Other Exercises Other Exercises: education on posterior hip precautions and functional implications, pt will continue to require education for adherence       General Comments  (Right buttocks skin tear with mepilex on)    Pertinent Vitals/ Pain       Pain Assessment Pain Assessment: 0-10 Pain Score: 7  (7 with mobility, 5 end of session in chair) Pain Location: R hip, pelvis Pain Descriptors / Indicators: Aching, Discomfort, Grimacing, Guarding Pain Intervention(s): Limited activity within patient's tolerance, Monitored during session, Premedicated before session   Frequency  Min 2X/week        Progress Toward Goals  OT Goals(current goals can now be found in the care plan section)     Acute Rehab OT Goals OT Goal Formulation: With patient Time For Goal Achievement: 12/12/23 Potential to  Achieve Goals: Good  Plan      Co-evaluation    PT/OT/SLP Co-Evaluation/Treatment: Yes Reason for Co-Treatment: To address functional/ADL transfers PT goals addressed during session: Mobility/safety with mobility;Balance;Proper use of DME;Strengthening/ROM OT goals addressed during session: ADL's and self-care      AM-PAC OT 6 Clicks Daily Activity     Outcome Measure   Help from another person eating meals?: None Help from another person taking care of personal grooming?: None Help from another person toileting, which includes using toliet, bedpan, or urinal?: A Lot Help from another person bathing (including washing, rinsing, drying)?: A Lot Help from another person to put on and taking off regular upper body clothing?: None Help from another person to put on and taking off regular lower body clothing?: A Lot 6 Click Score: 18    End of Session Equipment Utilized During Treatment: Rolling walker (2 wheels);Gait belt  OT Visit Diagnosis: Other abnormalities of gait and mobility (R26.89)   Activity Tolerance Patient tolerated treatment well   Patient Left with call bell/phone within reach;with bed alarm set;in chair   Nurse Communication Mobility status        Time: 1245-1320 OT Time Calculation (min): 35 min  Charges: OT General Charges $OT Visit: 1 Visit OT Treatments $Self Care/Home Management : 8-22 mins  Harun Brumley L. Athene Schuhmacher, OTR/L  12/07/23, 4:05 PM

## 2023-12-07 NOTE — TOC Progression Note (Addendum)
 Transition of Care [facility]) - Progression Note    Patient Details  Name: Sandra [name] MRN: [id number] Date of Birth: [birth year]  Transition of Care [facility]) CM/SW Contact  [place] Ar[place], [place] Phone Number: 12/07/2023, 9:37 AM  Clinical Narrative:     CSW initiated appeal process with [employer] for patient to discharge to a facility.   Reference Number is [id number]  3:56: Family contacted and advised that the appeal has been started. CSW left message with the daughter.   Expected Discharge Plan: Skilled Nursing Facility Barriers to Discharge: Continued Medical Work up  Expected Discharge Plan and Services   Discharge Planning Services: CM Consult Post Acute Care Choice: Skilled Nursing Facility Living arrangements for the past 2 months: Single Family Home                   DME Agency: NA       HH Arranged: NA           Social Determinants of Health (SDOH) Interventions SDOH Screenings   Food Insecurity: No Food Insecurity (11/27/2023)  Housing: High Risk (11/27/2023)  Transportation Needs: No Transportation Needs (11/26/2023)  Recent Concern: Transportation Needs - Unmet Transportation Needs (09/10/2023)  Utilities: Not At Risk (11/26/2023)  Recent Concern: Utilities - At Risk (09/10/2023)  Alcohol  Screen: Low Risk  (06/22/2021)  Depression (PHQ2-9): Low Risk  (06/22/2021)  Financial Resource Strain: Low Risk  (05/18/2021)  Physical Activity: Insufficiently Active (05/18/2021)  Social Connections: Unknown (11/27/2023)  Stress: No Stress Concern Present (05/18/2021)  Tobacco Use: Low Risk  (11/27/2023)    Readmission Risk Interventions     No data to display

## 2023-12-07 NOTE — Progress Notes (Signed)
 PROGRESS NOTE    Sandra [name]   FMW:[id number] DOB: 07/26/1945  DOA: 11/26/2023 Date of Service: 12/08/23 which is hospital day 12  PCP: Sampson Ethridge LABOR, MD    Hospital course / significant events:   HPI: Sandra [name] is a 78 y.o. female with medical history significant for left hip fracture s/p repair in march of this year, presenting with trip and fall at home with right hip pain.   06/30: admitted for R femoral neck fracture, pubic rami fractures. Plan OR tomorrow 07/01  R hip hemiarthroplasty. Eliquis  held pending ortho recs.  07/02-07/09: pending placement 07/10-07/11: appealing SNF denial    Consultants:  Orthopedic surgery   Procedures/Surgeries: 11/27/23 R hip hemiarthroplasty - Dr GORMAN Blanch      ASSESSMENT & PLAN:   Right femoral neck fracture Pubic rami fractures S/p mechanical fall at home. Also with right wrist pain, imaging of right knee, right ankle, and right wrist are negative S/p 11/27/23 R hip hemiarthroplasty   Orthopedic surgery following  Pain control Bowel regimen PT/OT  SNF rehab placement pending - appeal in process    A-fib S/p watchman device, cardioversion April of this year. Sinus rhythm currently cont home amiodarone , metoprolol   Held eliquis  peri-operaively, resume per ortho   Essential HTN  Hydralazine , losartan , metoprolol    GAD home xanax , sertraline    Hypothyroid home synthroid    Hypokalemia Replace as needed Monitor BMP       No concerns based on BMI: Body mass index is 22.14 kg/m.Sandra [name] Significantly low or high BMI is associated with higher medical risk.  Underweight - under 18  overweight - 25 to 29 obese - 30 or more Class 1 obesity: BMI of 30.0 to 34 Class 2 obesity: BMI of 35.0 to 39 Class 3 obesity: BMI of 40.0 to 49 Super Morbid Obesity: BMI 50-59 Super-super Morbid Obesity: BMI 60+ Healthy nutrition and physical activity advised as adjunct to other disease management and risk reduction  treatments    DVT prophylaxis: holding home eliquis , can likely restart this per ortho  IV fluids: no continuous IV fluids  Nutrition: regular diet once out of OR Central lines / other devices: none  Code Status: FULL CODE ACP documentation reviewed:  none on file in [facility]CA  TOC needs: SNF Medical barriers to dispo: noe=ne             Subjective / Brief ROS:  No new concerns today  Family Communication: none at this time    Objective Findings:  Vitals:   12/07/23 1453 12/07/23 1954 12/08/23 0413 12/08/23 0726  BP: (!) 119/52 (!) 105/50 126/61 (!) 142/67  Pulse: (!) 59 (!) 59 61 61  Resp: 16 18 18 16   Temp: 98.1 F (36.7 C) 98 F (36.7 C) 97.6 F (36.4 C) 97.9 F (36.6 C)  TempSrc:      SpO2: 97% 100% 96% 98%  Weight:      Height:        Intake/Output Summary (Last 24 hours) at 12/08/2023 9166 Last data filed at 12/07/2023 1503 Gross per 24 hour  Intake 720 ml  Output --  Net 720 ml   Filed Weights   11/27/23 0035  Weight: 56.7 kg    Examination:  Physical Exam Constitutional:      General: She is not in acute distress.    Appearance: She is not ill-appearing.  Cardiovascular:     Rate and Rhythm: Normal rate and regular rhythm.  Pulmonary:     Effort: Pulmonary  effort is normal.     Breath sounds: Normal breath sounds.  Neurological:     General: No focal deficit present.     Mental Status: She is alert and oriented to person, place, and time.  Psychiatric:        Behavior: Behavior normal.          Scheduled Medications:   acetaminophen   1,000 mg Oral Q8H   amiodarone   100 mg Oral Daily   atorvastatin   40 mg Oral Daily   brimonidine   1 drop Both Eyes BID   docusate sodium   100 mg Oral BID   enoxaparin  (LOVENOX ) injection  40 mg Subcutaneous Q24H   latanoprost   1 drop Both Eyes QHS   levothyroxine   100 mcg Oral Q0600   losartan   75 mg Oral Daily   pantoprazole   40 mg Oral Daily   polyethylene glycol  17 g Oral Daily    potassium chloride   40 mEq Oral Daily   senna  1 tablet Oral BID   sertraline   100 mg Oral Daily    Continuous Infusions:   PRN Medications:  ALPRAZolam , bisacodyl , hydrALAZINE , HYDROmorphone  (DILAUDID ) injection, menthol -cetylpyridinium **OR** phenol, methocarbamol , metoCLOPramide  **OR** metoCLOPramide  (REGLAN ) injection, metoprolol  tartrate, ondansetron  **OR** ondansetron  (ZOFRAN ) IV, mouth rinse, mouth rinse, oxyCODONE , oxyCODONE , senna-docusate, sodium phosphate , traMADol , zolpidem   Antimicrobials from admission:  Anti-infectives (From admission, onward)    Start     Dose/Rate Route Frequency Ordered Stop   11/27/23 2030  ceFAZolin  (ANCEF ) IVPB 1 g/50 mL premix        1 g 100 mL/hr over 30 Minutes Intravenous Every 6 hours 11/27/23 1710 11/28/23 0400   11/27/23 1200  ceFAZolin  (ANCEF ) IVPB 2g/100 mL premix        2 g 200 mL/hr over 30 Minutes Intravenous  Once 11/26/23 1258 11/27/23 1421           Data Reviewed:  I have personally reviewed the following...  CBC: No results for input(s): WBC, NEUTROABS, HGB, HCT, MCV, PLT in the last 168 hours.  Basic Metabolic Panel: No results for input(s): NA, K, CL, CO2, GLUCOSE, BUN, CREATININE, CALCIUM , MG, PHOS in the last 168 hours.  GFR: Estimated Creatinine Clearance: 47.9 mL/min (by C-G formula based on SCr of 0.45 mg/dL). Liver Function Tests: No results for input(s): AST, ALT, ALKPHOS, BILITOT, PROT, ALBUMIN  in the last 168 hours. No results for input(s): LIPASE, AMYLASE in the last 168 hours. No results for input(s): AMMONIA in the last 168 hours. Coagulation Profile: No results for input(s): INR, PROTIME in the last 168 hours. Cardiac Enzymes: No results for input(s): CKTOTAL, CKMB, CKMBINDEX, TROPONINI in the last 168 hours. BNP (last 3 results) No results for input(s): PROBNP in the last 8760 hours. HbA1C: No results for input(s): HGBA1C in the  last 72 hours. CBG: No results for input(s): GLUCAP in the last 168 hours. Lipid Profile: No results for input(s): CHOL, HDL, LDLCALC, TRIG, CHOLHDL, LDLDIRECT in the last 72 hours. Thyroid  Function Tests: No results for input(s): TSH, T4TOTAL, FREET4, T3FREE, THYROIDAB in the last 72 hours. Anemia Panel: No results for input(s): VITAMINB12, FOLATE, FERRITIN, TIBC, IRON , RETICCTPCT in the last 72 hours. Most Recent Urinalysis On File:     Component Value Date/Time   COLORURINE STRAW (A) 09/08/2023 1531   APPEARANCEUR CLEAR (A) 09/08/2023 1531   LABSPEC 1.018 09/08/2023 1531   PHURINE 7.0 09/08/2023 1531   GLUCOSEU NEGATIVE 09/08/2023 1531   HGBUR NEGATIVE 09/08/2023 1531   BILIRUBINUR NEGATIVE 09/08/2023 1531  BILIRUBINUR Negative 11/24/2019 1352   KETONESUR NEGATIVE 09/08/2023 1531   PROTEINUR NEGATIVE 09/08/2023 1531   UROBILINOGEN 0.2 11/24/2019 1352   NITRITE NEGATIVE 09/08/2023 1531   LEUKOCYTESUR NEGATIVE 09/08/2023 1531   Sepsis Labs: @LABRCNTIP (procalcitonin:4,lacticidven:4) Microbiology: No results found for this or any previous visit (from the past 240 hours).     Radiology Studies last 3 days: No results found.         Sharel Behne, DO [facility] 12/08/2023, 8:33 AM    Dictation software may have been used to generate the above note. Typos may occur and escape review in typed/dictated notes. Please contact Dr Marsa directly for clarity if needed.  Staff may message me via secure chat in Epic  but this may not receive an immediate response,  please page me for urgent matters!  If 7PM-7AM, please contact night coverage [email or web address]

## 2023-12-07 NOTE — Progress Notes (Signed)
 Physical Therapy Treatment Patient Details Name: Sandra [name] MRN: [id number] DOB: 08/11/1945 Today's Date: 12/07/2023   History of Present Illness Pt is a 78 year old who presented with a R femoral neck fracture + pubic rami fx after a fall s/p R hemiarthroplasty, posterior approach 11/27/23. PMH significant for previous left hip replacement, paroxysmal atrial fibrillation not on anticoagulation, heart failure, previous stroke    PT Comments  Overlap session with OT this am. Pt pre-medicated ~1 hour prior to session with improved pain relief at 7/10. Focused brief session on bed mobility, transfers, and education on increasing safety awareness and hip precautions. Pt left with OT to address ADL's. Will return in pm to progress mobility.    If plan is discharge home, recommend the following: A lot of help with walking and/or transfers;A lot of help with bathing/dressing/bathroom;Assist for transportation;Help with stairs or ramp for entrance   Can travel by private vehicle     No  Equipment Recommendations  Other (comment) (Defer to next level of care)    Recommendations for Other Services       Precautions / Restrictions Precautions Precautions: Posterior Hip;Fall Precaution Booklet Issued: Yes (comment) Recall of Precautions/Restrictions: Intact Precaution/Restrictions Comments: Able to recall 1:3 precautions Required Braces or Orthoses: Knee Immobilizer - Right Knee Immobilizer - Right: Other (comment) (KI on when in bed) Restrictions Weight Bearing Restrictions Per Provider Order: Yes RLE Weight Bearing Per Provider Order: Weight bearing as tolerated     Mobility  Bed Mobility Overal bed mobility: Needs Assistance Bed Mobility: Supine to Sit     Supine to sit: Min assist, HOB elevated, Used rails     General bed mobility comments: improved today but still needs assist    Transfers Overall transfer level: Needs assistance Equipment used: Rolling walker (2  wheels) Transfers: Sit to/from Stand Sit to Stand: Min assist, Contact guard assist           General transfer comment: improved today with better hand placements and ability to stand    Ambulation/Gait Ambulation/Gait assistance: Contact guard assist Gait Distance (Feet): 6 Feet Assistive device: Rolling walker (2 wheels) Gait Pattern/deviations: Step-to pattern, Antalgic, Decreased stance time - left Gait velocity: decreased     General Gait Details: Heavy reliance on RW to offload pain, slow cadence   Stairs         General stair comments:  (Pt has 2 steps to enter with single rail)   Wheelchair Mobility     Tilt Bed    Modified Rankin (Stroke Patients Only)       Balance Overall balance assessment: Needs assistance Sitting-balance support: Bilateral upper extremity supported, Feet supported Sitting balance-Leahy Scale: Good     Standing balance support: Bilateral upper extremity supported, During functional activity, Reliant on assistive device for balance Standing balance-Leahy Scale: Fair Standing balance comment: improving but still needs +1 for mobility                            Communication Communication Communication: Impaired Factors Affecting Communication: Hearing impaired  Cognition Arousal: Alert Behavior During Therapy: WFL for tasks assessed/performed   PT - Cognitive impairments: No apparent impairments                       PT - Cognition Comments: pleasant and cooperative Following commands: Intact      Cueing Cueing Techniques: Verbal cues, Tactile cues  Exercises Other Exercises Other  Exercises: Pt educated on wear time for KI, hip precautions, safe mobility, and pain control    General Comments General comments (skin integrity, edema, etc.):  (Right buttocks skin tear with mepilex on)      Pertinent Vitals/Pain Pain Assessment Pain Assessment: 0-10 Pain Score: 7  Pain Location: R hip,  pelvis Pain Descriptors / Indicators: Aching, Discomfort, Grimacing, Guarding Pain Intervention(s): Premedicated before session    Home Living                          Prior Function            PT Goals (current goals can now be found in the care plan section) Acute Rehab PT Goals Patient Stated Goal: decrease pain Progress towards PT goals: Progressing toward goals    Frequency    BID      PT Plan      Co-evaluation   Reason for Co-Treatment: To address functional/ADL transfers          AM-PAC PT 6 Clicks Mobility   Outcome Measure  Help needed turning from your back to your side while in a flat bed without using bedrails?: A Lot Help needed moving from lying on your back to sitting on the side of a flat bed without using bedrails?: A Lot Help needed moving to and from a bed to a chair (including a wheelchair)?: A Little Help needed standing up from a chair using your arms (e.g., wheelchair or bedside chair)?: A Little Help needed to walk in hospital room?: A Little Help needed climbing 3-5 steps with a railing? : Total 6 Click Score: 14    End of Session Equipment Utilized During Treatment: Gait belt Activity Tolerance: Patient tolerated treatment well;Patient limited by pain Patient left: in chair;Other (comment) (with OT) Nurse Communication: Mobility status PT Visit Diagnosis: Unsteadiness on feet (R26.81);Other abnormalities of gait and mobility (R26.89);Repeated falls (R29.6);Muscle weakness (generalized) (M62.81);History of falling (Z91.81)     Time: 8754-8697 PT Time Calculation (min) (ACUTE ONLY): 17 min  Charges:    $Therapeutic Activity: 8-22 mins PT General Charges $$ ACUTE PT VISIT: 1 Visit                    Darice Bohr, PTA  Darice JAYSON Bohr 12/07/2023, 3:33 PM

## 2023-12-07 NOTE — Plan of Care (Signed)
  Problem: Pain Managment: Goal: General experience of comfort will improve and/or be controlled Outcome: Progressing   Problem: Safety: Goal: Ability to remain free from injury will improve Outcome: Progressing   Problem: Skin Integrity: Goal: Risk for impaired skin integrity will decrease Outcome: Progressing

## 2023-12-07 NOTE — Plan of Care (Signed)
  Problem: Activity: Goal: Risk for activity intolerance will decrease Outcome: Progressing   Problem: Nutrition: Goal: Adequate nutrition will be maintained Outcome: Progressing   Problem: Coping: Goal: Level of anxiety will decrease Outcome: Progressing   Problem: Pain Managment: Goal: General experience of comfort will improve and/or be controlled Outcome: Progressing   Problem: Safety: Goal: Ability to remain free from injury will improve Outcome: Progressing   Problem: Activity: Goal: Ability to avoid complications of mobility impairment will improve Outcome: Progressing Goal: Ability to tolerate increased activity will improve Outcome: Progressing

## 2023-12-08 ENCOUNTER — Ambulatory Visit: Payer: Self-pay | Admitting: Cardiology

## 2023-12-08 DIAGNOSIS — S72001A Fracture of unspecified part of neck of right femur, initial encounter for closed fracture: Secondary | ICD-10-CM | POA: Diagnosis not present

## 2023-12-08 NOTE — Progress Notes (Signed)
 PROGRESS NOTE    Sandra [name]   FMW:[id number] DOB: Jun 20, 1945  DOA: 11/26/2023 Date of Service: 12/08/23 which is hospital day 12  PCP: Sampson Ethridge LABOR, MD    Hospital course / significant events:   HPI: Sandra [name] is a 78 y.o. female with medical history significant for left hip fracture s/p repair in march of this year, presenting with trip and fall at home with right hip pain.   06/30: admitted for R femoral neck fracture, pubic rami fractures. Plan OR tomorrow 07/01  R hip hemiarthroplasty. Eliquis  held pending ortho recs.  07/02-07/09: pending placement 07/10-07/12: appealing SNF denial    Consultants:  Orthopedic surgery   Procedures/Surgeries: 11/27/23 R hip hemiarthroplasty - Dr GORMAN Blanch      ASSESSMENT & PLAN:   Right femoral neck fracture Pubic rami fractures S/p mechanical fall at home. Also with right wrist pain, imaging of right knee, right ankle, and right wrist are negative S/p 11/27/23 R hip hemiarthroplasty   Orthopedic surgery following  Pain control Bowel regimen PT/OT  SNF rehab placement pending - appeal in process    A-fib S/p watchman device, cardioversion April of this year. Sinus rhythm currently cont home amiodarone , metoprolol   Held eliquis  peri-operaively, resume per ortho   Essential HTN  Hydralazine , losartan , metoprolol    GAD home xanax , sertraline    Hypothyroid home synthroid    Hypokalemia Replace as needed Monitor BMP       No concerns based on BMI: Body mass index is 22.14 kg/m.[name] Significantly low or high BMI is associated with higher medical risk.  Underweight - under 18  overweight - 25 to 29 obese - 30 or more Class 1 obesity: BMI of 30.0 to 34 Class 2 obesity: BMI of 35.0 to 39 Class 3 obesity: BMI of 40.0 to 49 Super Morbid Obesity: BMI 50-59 Super-super Morbid Obesity: BMI 60+ Healthy nutrition and physical activity advised as adjunct to other disease management and risk reduction  treatments    DVT prophylaxis: holding home eliquis , can likely restart this per ortho  IV fluids: no continuous IV fluids  Nutrition: regular diet once out of OR Central lines / other devices: none  Code Status: FULL CODE ACP documentation reviewed:  none on file in [facility]  TOC needs: SNF Medical barriers to dispo: noe=ne             Subjective / Brief ROS:  No new concerns today  Family Communication: none at this time    Objective Findings:  Vitals:   12/07/23 1453 12/07/23 1954 12/08/23 0413 12/08/23 0726  BP: (!) 119/52 (!) 105/50 126/61 (!) 142/67  Pulse: (!) 59 (!) 59 61 61  Resp: 16 18 18 16   Temp: 98.1 F (36.7 C) 98 F (36.7 C) 97.6 F (36.4 C) 97.9 F (36.6 C)  TempSrc:      SpO2: 97% 100% 96% 98%  Weight:      Height:        Intake/Output Summary (Last 24 hours) at 12/08/2023 0834 Last data filed at 12/07/2023 1503 Gross per 24 hour  Intake 720 ml  Output --  Net 720 ml   Filed Weights   11/27/23 0035  Weight: 56.7 kg    Examination:  Physical Exam Constitutional:      General: She is not in acute distress.    Appearance: She is not ill-appearing.  Cardiovascular:     Rate and Rhythm: Normal rate and regular rhythm.  Pulmonary:     Effort: Pulmonary  effort is normal.     Breath sounds: Normal breath sounds.  Neurological:     General: No focal deficit present.     Mental Status: She is alert and oriented to person, place, and time.  Psychiatric:        Behavior: Behavior normal.          Scheduled Medications:   acetaminophen   1,000 mg Oral Q8H   amiodarone   100 mg Oral Daily   atorvastatin   40 mg Oral Daily   brimonidine   1 drop Both Eyes BID   docusate sodium   100 mg Oral BID   enoxaparin  (LOVENOX ) injection  40 mg Subcutaneous Q24H   latanoprost   1 drop Both Eyes QHS   levothyroxine   100 mcg Oral Q0600   losartan   75 mg Oral Daily   pantoprazole   40 mg Oral Daily   polyethylene glycol  17 g Oral Daily    potassium chloride   40 mEq Oral Daily   senna  1 tablet Oral BID   sertraline   100 mg Oral Daily    Continuous Infusions:   PRN Medications:  ALPRAZolam , bisacodyl , hydrALAZINE , HYDROmorphone  (DILAUDID ) injection, menthol -cetylpyridinium **OR** phenol, methocarbamol , metoCLOPramide  **OR** metoCLOPramide  (REGLAN ) injection, metoprolol  tartrate, ondansetron  **OR** ondansetron  (ZOFRAN ) IV, mouth rinse, mouth rinse, oxyCODONE , oxyCODONE , senna-docusate, sodium phosphate , traMADol , zolpidem   Antimicrobials from admission:  Anti-infectives (From admission, onward)    Start     Dose/Rate Route Frequency Ordered Stop   11/27/23 2030  ceFAZolin  (ANCEF ) IVPB 1 g/50 mL premix        1 g 100 mL/hr over 30 Minutes Intravenous Every 6 hours 11/27/23 1710 11/28/23 0400   11/27/23 1200  ceFAZolin  (ANCEF ) IVPB 2g/100 mL premix        2 g 200 mL/hr over 30 Minutes Intravenous  Once 11/26/23 1258 11/27/23 1421           Data Reviewed:  I have personally reviewed the following...  CBC: No results for input(s): WBC, NEUTROABS, HGB, HCT, MCV, PLT in the last 168 hours.  Basic Metabolic Panel: No results for input(s): NA, K, CL, CO2, GLUCOSE, BUN, CREATININE, CALCIUM , MG, PHOS in the last 168 hours.  GFR: Estimated Creatinine Clearance: 47.9 mL/min (by C-G formula based on SCr of 0.45 mg/dL). Liver Function Tests: No results for input(s): AST, ALT, ALKPHOS, BILITOT, PROT, ALBUMIN  in the last 168 hours. No results for input(s): LIPASE, AMYLASE in the last 168 hours. No results for input(s): AMMONIA in the last 168 hours. Coagulation Profile: No results for input(s): INR, PROTIME in the last 168 hours. Cardiac Enzymes: No results for input(s): CKTOTAL, CKMB, CKMBINDEX, TROPONINI in the last 168 hours. BNP (last 3 results) No results for input(s): PROBNP in the last 8760 hours. HbA1C: No results for input(s): HGBA1C in the  last 72 hours. CBG: No results for input(s): GLUCAP in the last 168 hours. Lipid Profile: No results for input(s): CHOL, HDL, LDLCALC, TRIG, CHOLHDL, LDLDIRECT in the last 72 hours. Thyroid  Function Tests: No results for input(s): TSH, T4TOTAL, FREET4, T3FREE, THYROIDAB in the last 72 hours. Anemia Panel: No results for input(s): VITAMINB12, FOLATE, FERRITIN, TIBC, IRON , RETICCTPCT in the last 72 hours. Most Recent Urinalysis On File:     Component Value Date/Time   COLORURINE STRAW (A) 09/08/2023 1531   APPEARANCEUR CLEAR (A) 09/08/2023 1531   LABSPEC 1.018 09/08/2023 1531   PHURINE 7.0 09/08/2023 1531   GLUCOSEU NEGATIVE 09/08/2023 1531   HGBUR NEGATIVE 09/08/2023 1531   BILIRUBINUR NEGATIVE 09/08/2023 1531  BILIRUBINUR Negative 11/24/2019 1352   KETONESUR NEGATIVE 09/08/2023 1531   PROTEINUR NEGATIVE 09/08/2023 1531   UROBILINOGEN 0.2 11/24/2019 1352   NITRITE NEGATIVE 09/08/2023 1531   LEUKOCYTESUR NEGATIVE 09/08/2023 1531   Sepsis Labs: @LABRCNTIP (procalcitonin:4,lacticidven:4) Microbiology: No results found for this or any previous visit (from the past 240 hours).     Radiology Studies last 3 days: No results found.         Starlett Pehrson, DO [facility] 12/08/2023, 8:34 AM    Dictation software may have been used to generate the above note. Typos may occur and escape review in typed/dictated notes. Please contact Dr Marsa directly for clarity if needed.  Staff may message me via secure chat in Epic  but this may not receive an immediate response,  please page me for urgent matters!  If 7PM-7AM, please contact night coverage [email or web address]

## 2023-12-08 NOTE — Progress Notes (Signed)
 Physical Therapy Treatment Patient Details Name: Sandra [name] MRN: [id number] DOB: [birth year] Today's Date: 12/08/2023   History of Present Illness Pt is a 78 year old who presented with a R femoral neck fracture + pubic rami fx after a fall s/p R hemiarthroplasty, posterior approach 11/27/23. PMH significant for previous left hip replacement, paroxysmal atrial fibrillation not on anticoagulation, heart failure, previous stroke    PT Comments  Pt remains very motivated to participate and progress. Pain from pelvic fx continues, however pt with increased tolerance once pre-medicated. R LE continues to gain strength and mobility with improved swing through during gait and heel strike. Pt is not at functional independent baseline at this time and will benefit from continued PT services acutely. 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 Continue per POC.    If plan is discharge home, recommend the following: A little help with walking and/or transfers;A lot of help with bathing/dressing/bathroom;Assistance with cooking/housework;Assist for transportation;Help with stairs or ramp for entrance   Can travel by private vehicle     No  Equipment Recommendations  Other (comment) (Defer to next level of care)    Recommendations for Other Services       Precautions / Restrictions Precautions Precautions: Posterior Hip;Fall Precaution Booklet Issued: Yes (comment) Recall of Precautions/Restrictions: Intact Precaution/Restrictions Comments: Able to recall 3:3 precautions Required Braces or Orthoses: Knee Immobilizer - Right Knee Immobilizer - Right: Other (comment) (KI on when in bed) Restrictions Weight Bearing Restrictions Per Provider Order: Yes RLE Weight Bearing Per Provider Order: Weight bearing as tolerated     Mobility  Bed Mobility               General bed mobility comments:  (Pt in chair pre/post session)    Transfers Overall transfer level: Needs  assistance Equipment used: Rolling walker (2 wheels) Transfers: Sit to/from Stand Sit to Stand: Min assist, Contact guard assist           General transfer comment: Good technique with precautions, able to assist with hygiene    Ambulation/Gait Ambulation/Gait assistance: Contact guard assist Gait Distance (Feet): 100 Feet Assistive device: Rolling walker (2 wheels) Gait Pattern/deviations: Step-to pattern, Antalgic, Decreased stance time - left Gait velocity: decreased     General Gait Details: Heavy reliance on RW to offload pain, slow cadence   Stairs         General stair comments:  (Pt has 2 steps to enter with rail)   Wheelchair Mobility     Tilt Bed    Modified Rankin (Stroke Patients Only)       Balance Overall balance assessment: Needs assistance Sitting-balance support: Bilateral upper extremity supported, Feet supported Sitting balance-Leahy Scale: Good     Standing balance support: Bilateral upper extremity supported, During functional activity, Reliant on assistive device for balance Standing balance-Leahy Scale: Fair Standing balance comment: No LOB with gait and RW                            Communication Communication Communication: Impaired Factors Affecting Communication: Hearing impaired  Cognition Arousal: Alert Behavior During Therapy: WFL for tasks assessed/performed   PT - Cognitive impairments: No apparent impairments                       PT - Cognition Comments: pleasant and cooperative Following commands: Intact      Cueing Cueing Techniques: Verbal cues, Tactile cues  Exercises  Total Joint Exercises Ankle Circles/Pumps: AROM, Both, 10 reps Long Arc Quad: AROM, Both, 10 reps, Seated    General Comments        Pertinent Vitals/Pain Pain Assessment Pain Assessment: Faces Faces Pain Scale: Hurts little more Pain Location: R hip, pelvis Pain Descriptors / Indicators: Aching, Discomfort,  Grimacing, Guarding Pain Intervention(s): Premedicated before session    Home Living                          Prior Function            PT Goals (current goals can now be found in the care plan section) Acute Rehab PT Goals Patient Stated Goal: decrease pain Progress towards PT goals: Progressing toward goals    Frequency    BID      PT Plan      Co-evaluation              AM-PAC PT 6 Clicks Mobility   Outcome Measure  Help needed turning from your back to your side while in a flat bed without using bedrails?: A Lot Help needed moving from lying on your back to sitting on the side of a flat bed without using bedrails?: A Lot Help needed moving to and from a bed to a chair (including a wheelchair)?: A Little Help needed standing up from a chair using your arms (e.g., wheelchair or bedside chair)?: A Little Help needed to walk in hospital room?: A Little Help needed climbing 3-5 steps with a railing? : Total 6 Click Score: 14    End of Session Equipment Utilized During Treatment: Gait belt Activity Tolerance: Patient tolerated treatment well Patient left: in chair;with call bell/phone within reach;with chair alarm set Nurse Communication: Mobility status PT Visit Diagnosis: Unsteadiness on feet (R26.81);Other abnormalities of gait and mobility (R26.89);Repeated falls (R29.6);Muscle weakness (generalized) (M62.81);History of falling (Z91.81)     Time: 8576-8556 PT Time Calculation (min) (ACUTE ONLY): 20 min  Charges:    $Therapeutic Activity: 8-22 mins PT General Charges $$ ACUTE PT VISIT: 1 Visit                    Darice Bohr, PTA  Darice JAYSON Bohr 12/08/2023, 3:46 PM

## 2023-12-08 NOTE — Plan of Care (Signed)
  Problem: Clinical Measurements: Goal: Will remain free from infection Outcome: Progressing   Problem: Activity: Goal: Risk for activity intolerance will decrease Outcome: Progressing   Problem: Nutrition: Goal: Adequate nutrition will be maintained Outcome: Progressing   Problem: Coping: Goal: Level of anxiety will decrease Outcome: Progressing   Problem: Elimination: Goal: Will not experience complications related to bowel motility Outcome: Progressing Goal: Will not experience complications related to urinary retention Outcome: Progressing   Problem: Activity: Goal: Ability to avoid complications of mobility impairment will improve Outcome: Progressing Goal: Ability to tolerate increased activity will improve Outcome: Progressing

## 2023-12-08 NOTE — Plan of Care (Signed)
   Problem: Education: Goal: Knowledge of General Education information will improve Description: Including pain rating scale, medication(s)/side effects and non-pharmacologic comfort measures Outcome: Progressing   Problem: Activity: Goal: Risk for activity intolerance will decrease Outcome: Progressing

## 2023-12-08 NOTE — Progress Notes (Cosign Needed Addendum)
 The above named patient is recommended to go to Short Term Rehab for strengthening and gait training for balance.  It is expected that the Short Term Rehab stay will be less than 30 days.  The patient is expected to return home after Rehab.

## 2023-12-08 NOTE — Progress Notes (Signed)
 Pt seen this pm for progressive transfer and gait training in hall with use of RW to off load pelvic pain. Pt does better when pre-medicated. Overall great participation and motivation to improve. Pt still not at safe baseline LOF. Will continue to progress acutely per POC   12/07/23 1530  PT Visit Information  Assistance Needed +1  History of Present Illness Pt is a 78 year old who presented with a R femoral neck fracture + pubic rami fx after a fall s/p R hemiarthroplasty, posterior approach 11/27/23. PMH significant for previous left hip replacement, paroxysmal atrial fibrillation not on anticoagulation, heart failure, previous stroke  Subjective Data  Subjective I'm ready!  Patient Stated Goal decrease pain  Precautions  Precautions Posterior Hip;Fall  Precaution Booklet Issued Yes (comment) (Able to recall 3:3 precautions)  Recall of Precautions/Restrictions Intact  Required Braces or Orthoses Knee Immobilizer - Right  Knee Immobilizer - Right Other (comment) (KI on when in bed)  Restrictions  Weight Bearing Restrictions Per Provider Order Yes  RLE Weight Bearing Per Provider Order WBAT  Pain Assessment  Pain Assessment 0-10  Pain Score 7  Pain Location R hip, pelvis  Pain Descriptors / Indicators Aching;Discomfort;Grimacing;Guarding  Pain Intervention(s) Premedicated before session  Cognition  Arousal Alert  Behavior During Therapy WFL for tasks assessed/performed  PT - Cognitive impairments No apparent impairments  PT - Cognition Comments pleasant and cooperative  Following Commands  Following commands Intact  Cueing  Cueing Techniques Verbal cues;Tactile cues  Communication  Communication Impaired  Factors Affecting Communication Hearing impaired  Bed Mobility  Overal bed mobility Needs Assistance  Bed Mobility Sit to Supine  Sit to supine Min assist;Used rails (For L LE)  General bed mobility comments Attempted leg lifter to assist with L LE management, pt  struggled, will re-attempt  Transfers  Overall transfer level Needs assistance  Equipment used Rolling walker (2 wheels)  Transfers Sit to/from Stand  Sit to Stand Min assist;Contact guard assist  General transfer comment improved today with better hand placements and ability to stand  Ambulation/Gait  Ambulation/Gait assistance Contact guard assist  Gait Distance (Feet)  (50)  Assistive device Rolling walker (2 wheels)  Gait Pattern/deviations Step-to pattern;Antalgic;Decreased stance time - left  General Gait Details Heavy reliance on RW to offload pain, slow cadence  Gait velocity decreased  Balance  Overall balance assessment Needs assistance  Sitting-balance support Bilateral upper extremity supported;Feet supported  Sitting balance-Leahy Scale Good  Standing balance support Bilateral upper extremity supported;During functional activity;Reliant on assistive device for balance  Standing balance-Leahy Scale Fair  Standing balance comment improving but still needs +1 for mobility  Exercises  Exercises General Lower Extremity  Total Joint Exercises  Ankle Circles/Pumps AROM;Both;10 reps  Long Arc Quad AROM;Both;10 reps;Seated  Other Exercises  Other Exercises Pt educated on wear time for KI, hip precautions, safe mobility, and pain control  PT - End of Session  Equipment Utilized During Treatment Gait belt  Activity Tolerance Patient tolerated treatment well;Patient limited by pain  Patient left in bed;with call bell/phone within reach;with bed alarm set;with SCD's reapplied  Nurse Communication Mobility status   PT - Assessment/Plan  PT Visit Diagnosis Unsteadiness on feet (R26.81);Other abnormalities of gait and mobility (R26.89);Repeated falls (R29.6);Muscle weakness (generalized) (M62.81);History of falling (Z91.81)  PT Frequency (ACUTE ONLY) BID (vs QD)  Follow Up Recommendations Skilled nursing-short term rehab (<3 hours/day)  Can patient physically be transported by  private vehicle No  Patient can return home with the following A  lot of help with walking and/or transfers;A lot of help with bathing/dressing/bathroom;Assist for transportation;Help with stairs or ramp for entrance  PT equipment Other (comment)  AM-PAC PT 6 Clicks Mobility Outcome Measure (Version 2)  Help needed turning from your back to your side while in a flat bed without using bedrails? 2  Help needed moving from lying on your back to sitting on the side of a flat bed without using bedrails? 2  Help needed moving to and from a bed to a chair (including a wheelchair)? 3  Help needed standing up from a chair using your arms (e.g., wheelchair or bedside chair)? 3  Help needed to walk in hospital room? 3  Help needed climbing 3-5 steps with a railing?  1  6 Click Score 14  Consider Recommendation of Discharge To: CIR/SNF/LTACH  Progressive Mobility  What is the highest level of mobility based on the progressive mobility assessment? Level 5 (Walks with assist in room/hall) - Balance while stepping forward/back and can walk in room with assist - Complete  Mobility Referral Yes  Activity Ambulated with assistance in hallway  PT Goal Progression  Progress towards PT goals Progressing toward goals  PT Time Calculation  PT Start Time (ACUTE ONLY) 1500  PT Stop Time (ACUTE ONLY) 1521  PT Time Calculation (min) (ACUTE ONLY) 21 min  PT General Charges  $$ ACUTE PT VISIT 1 Visit  PT Treatments  $Therapeutic Activity 8-22 mins  Darice Bohr, PTA

## 2023-12-09 DIAGNOSIS — S72001A Fracture of unspecified part of neck of right femur, initial encounter for closed fracture: Secondary | ICD-10-CM | POA: Diagnosis not present

## 2023-12-09 NOTE — Progress Notes (Signed)
 PROGRESS NOTE    Sandra [name]   FMW:[id number] DOB: 1945-05-30  DOA: 11/26/2023 Date of Service: 12/09/23 which is hospital day 13  PCP: Sampson Ethridge LABOR, MD    Hospital course / significant events:   HPI: Sandra [name] is a 78 y.o. female with medical history significant for left hip fracture s/p repair in march of this year, presenting with trip and fall at home with right hip pain.   06/30: admitted for R femoral neck fracture, pubic rami fractures. Plan OR tomorrow 07/01  R hip hemiarthroplasty. Eliquis  held pending ortho recs.  07/02-07/09: pending placement 07/10-07/13: appealing SNF denial    Consultants:  Orthopedic surgery   Procedures/Surgeries: 11/27/23 R hip hemiarthroplasty - Dr GORMAN Blanch      ASSESSMENT & PLAN:   Right femoral neck fracture Pubic rami fractures S/p mechanical fall at home. Also with right wrist pain, imaging of right knee, right ankle, and right wrist are negative S/p 11/27/23 R hip hemiarthroplasty   Orthopedic surgery following  Pain control Bowel regimen PT/OT  SNF rehab placement pending - appeal in process    A-fib S/p watchman device, cardioversion April of this year. Sinus rhythm currently cont home amiodarone , metoprolol   Held eliquis  peri-operaively, resume per ortho   Essential HTN  Hydralazine , losartan , metoprolol    GAD home xanax , sertraline    Hypothyroid home synthroid    Hypokalemia Replace as needed Monitor BMP       No concerns based on BMI: Body mass index is 22.14 kg/m.[name] Significantly low or high BMI is associated with higher medical risk.  Underweight - under 18  overweight - 25 to 29 obese - 30 or more Class 1 obesity: BMI of 30.0 to 34 Class 2 obesity: BMI of 35.0 to 39 Class 3 obesity: BMI of 40.0 to 49 Super Morbid Obesity: BMI 50-59 Super-super Morbid Obesity: BMI 60+ Healthy nutrition and physical activity advised as adjunct to other disease management and risk reduction  treatments    DVT prophylaxis: holding home eliquis , can likely restart this per ortho  IV fluids: no continuous IV fluids  Nutrition: regular diet once out of OR Central lines / other devices: none  Code Status: FULL CODE ACP documentation reviewed:  none on file in [facility]  TOC needs: SNF Medical barriers to dispo: noe=ne             Subjective / Brief ROS:  No new concerns today Pt reports pain is controlled   Family Communication: none at this time    Objective Findings:  Vitals:   12/08/23 1735 12/08/23 2100 12/09/23 0305 12/09/23 0807  BP: 129/62 (!) 156/67 (!) 146/76 (!) 156/72  Pulse: (!) 58 63 66 65  Resp: 16 18 16 16   Temp: 98 F (36.7 C) 98.2 F (36.8 C) 97.6 F (36.4 C) (!) 97.5 F (36.4 C)  TempSrc:  Oral Oral Oral  SpO2:  99% 100% 100%  Weight:      Height:        Intake/Output Summary (Last 24 hours) at 12/09/2023 1243 Last data filed at 12/08/2023 2000 Gross per 24 hour  Intake 240 ml  Output --  Net 240 ml   Filed Weights   11/27/23 0035  Weight: 56.7 kg    Examination:  Physical Exam Constitutional:      General: She is not in acute distress.    Appearance: She is not ill-appearing.  Cardiovascular:     Rate and Rhythm: Normal rate and regular rhythm.  Pulmonary:  Effort: Pulmonary effort is normal.     Breath sounds: Normal breath sounds.  Neurological:     General: No focal deficit present.     Mental Status: She is alert and oriented to person, place, and time.  Psychiatric:        Behavior: Behavior normal.          Scheduled Medications:   acetaminophen   1,000 mg Oral Q8H   amiodarone   100 mg Oral Daily   atorvastatin   40 mg Oral Daily   brimonidine   1 drop Both Eyes BID   docusate sodium   100 mg Oral BID   enoxaparin  (LOVENOX ) injection  40 mg Subcutaneous Q24H   latanoprost   1 drop Both Eyes QHS   levothyroxine   100 mcg Oral Q0600   losartan   75 mg Oral Daily   pantoprazole   40 mg Oral Daily    polyethylene glycol  17 g Oral Daily   potassium chloride   40 mEq Oral Daily   senna  1 tablet Oral BID   sertraline   100 mg Oral Daily    Continuous Infusions:   PRN Medications:  ALPRAZolam , bisacodyl , hydrALAZINE , HYDROmorphone  (DILAUDID ) injection, menthol -cetylpyridinium **OR** phenol, methocarbamol , metoCLOPramide  **OR** metoCLOPramide  (REGLAN ) injection, metoprolol  tartrate, ondansetron  **OR** ondansetron  (ZOFRAN ) IV, mouth rinse, mouth rinse, oxyCODONE , oxyCODONE , senna-docusate, sodium phosphate , traMADol , zolpidem   Antimicrobials from admission:  Anti-infectives (From admission, onward)    Start     Dose/Rate Route Frequency Ordered Stop   11/27/23 2030  ceFAZolin  (ANCEF ) IVPB 1 g/50 mL premix        1 g 100 mL/hr over 30 Minutes Intravenous Every 6 hours 11/27/23 1710 11/28/23 0400   11/27/23 1200  ceFAZolin  (ANCEF ) IVPB 2g/100 mL premix        2 g 200 mL/hr over 30 Minutes Intravenous  Once 11/26/23 1258 11/27/23 1421           Data Reviewed:  I have personally reviewed the following...  CBC: No results for input(s): WBC, NEUTROABS, HGB, HCT, MCV, PLT in the last 168 hours.  Basic Metabolic Panel: No results for input(s): NA, K, CL, CO2, GLUCOSE, BUN, CREATININE, CALCIUM , MG, PHOS in the last 168 hours.  GFR: Estimated Creatinine Clearance: 47.9 mL/min (by C-G formula based on SCr of 0.45 mg/dL). Liver Function Tests: No results for input(s): AST, ALT, ALKPHOS, BILITOT, PROT, ALBUMIN  in the last 168 hours. No results for input(s): LIPASE, AMYLASE in the last 168 hours. No results for input(s): AMMONIA in the last 168 hours. Coagulation Profile: No results for input(s): INR, PROTIME in the last 168 hours. Cardiac Enzymes: No results for input(s): CKTOTAL, CKMB, CKMBINDEX, TROPONINI in the last 168 hours. BNP (last 3 results) No results for input(s): PROBNP in the last 8760 hours. HbA1C: No  results for input(s): HGBA1C in the last 72 hours. CBG: No results for input(s): GLUCAP in the last 168 hours. Lipid Profile: No results for input(s): CHOL, HDL, LDLCALC, TRIG, CHOLHDL, LDLDIRECT in the last 72 hours. Thyroid  Function Tests: No results for input(s): TSH, T4TOTAL, FREET4, T3FREE, THYROIDAB in the last 72 hours. Anemia Panel: No results for input(s): VITAMINB12, FOLATE, FERRITIN, TIBC, IRON , RETICCTPCT in the last 72 hours. Most Recent Urinalysis On File:     Component Value Date/Time   COLORURINE STRAW (A) 09/08/2023 1531   APPEARANCEUR CLEAR (A) 09/08/2023 1531   LABSPEC 1.018 09/08/2023 1531   PHURINE 7.0 09/08/2023 1531   GLUCOSEU NEGATIVE 09/08/2023 1531   HGBUR NEGATIVE 09/08/2023 1531   BILIRUBINUR NEGATIVE 09/08/2023  1531   BILIRUBINUR Negative 11/24/2019 1352   KETONESUR NEGATIVE 09/08/2023 1531   PROTEINUR NEGATIVE 09/08/2023 1531   UROBILINOGEN 0.2 11/24/2019 1352   NITRITE NEGATIVE 09/08/2023 1531   LEUKOCYTESUR NEGATIVE 09/08/2023 1531   Sepsis Labs: @LABRCNTIP (procalcitonin:4,lacticidven:4) Microbiology: No results found for this or any previous visit (from the past 240 hours).     Radiology Studies last 3 days: No results found.         Bunnie Lederman, DO [facility] 12/09/2023, 12:43 PM    Dictation software may have been used to generate the above note. Typos may occur and escape review in typed/dictated notes. Please contact Dr Marsa directly for clarity if needed.  Staff may message me via secure chat in Epic  but this may not receive an immediate response,  please page me for urgent matters!  If 7PM-7AM, please contact night coverage [email or web address]

## 2023-12-09 NOTE — Progress Notes (Signed)
 Physical Therapy Treatment Patient Details Name: Sandra [name] MRN: [id number] DOB: Dec 01, 1945 Today's Date: 12/09/2023   History of Present Illness Pt is a 78 year old who presented with a R femoral neck fracture + pubic rami fx after a fall s/p R hemiarthroplasty, posterior approach 11/27/23. PMH significant for previous left hip replacement, paroxysmal atrial fibrillation not on anticoagulation, heart failure, previous stroke    PT Comments  Pt ready for session.  She continues to need min a x 1 for transitions OOB despite bed features and increased time.  Once sitting she is steady.  She continues to try to pull up on RW despite cues and education.  This thing needs brakes  reviewed need to push up from bed to stand but continues to need cues and interventions despite education and cues.  Once standing she is able to walk to bathroom to void then continue with gait in hallway.  Upon return to room she remains standing for standing AROM.  Continues to need +1 assist to transition to sitting with cues and assist to find armrests to guide her to sitting.  Seated AROM.  Pt is making good progressing towards goals but continues to need +1 assist for basic mobility.  She voices continues deficits in balance and mobility and not at baseline.  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  <3 hrs of therapy remain recommended for a successful discharge home.   If plan is discharge home, recommend the following: A little help with walking and/or transfers;A lot of help with bathing/dressing/bathroom;Assistance with cooking/housework;Assist for transportation;Help with stairs or ramp for entrance   Can travel by private vehicle        Equipment Recommendations       Recommendations for Other Services       Precautions / Restrictions Precautions Precautions: Posterior Hip;Fall Precaution  Booklet Issued: Yes (comment) Recall of Precautions/Restrictions: Intact Precaution/Restrictions Comments: Able to recall 3:3 precautions Required Braces or Orthoses: Knee Immobilizer - Right Knee Immobilizer - Right: Other (comment) (KI on when in bed) Restrictions Weight Bearing Restrictions Per Provider Order: Yes RLE Weight Bearing Per Provider Order: Weight bearing as tolerated     Mobility  Bed Mobility Overal bed mobility: Needs Assistance Bed Mobility: Supine to Sit     Supine to sit: Min assist, HOB elevated, Used rails       Patient Response: Cooperative  Transfers Overall transfer level: Needs assistance Equipment used: Rolling walker (2 wheels) Transfers: Sit to/from Stand Sit to Stand: Min assist, Contact guard assist                Ambulation/Gait Ambulation/Gait assistance: Contact guard assist Gait Distance (Feet): 120 Feet Assistive device: Rolling walker (2 wheels) Gait Pattern/deviations: Step-to pattern, Antalgic, Decreased stance time - left Gait velocity: decreased     General Gait Details: progressing with mobility.  continues to need +1 for assist for balance and transitions, cues for safety   Stairs             Wheelchair Mobility     Tilt Bed Tilt Bed Patient Response: Cooperative  Modified Rankin (Stroke Patients Only)       Balance Overall balance assessment: Needs assistance Sitting-balance support: Bilateral upper extremity supported, Feet supported Sitting balance-Leahy Scale: Good     Standing balance support: Bilateral upper extremity supported, During functional activity, Reliant on assistive  device for balance Standing balance-Leahy Scale: Fair Standing balance comment: most deficits noted with transitions.                            Communication Communication Communication: No apparent difficulties  Cognition Arousal: Alert Behavior During Therapy: WFL for tasks assessed/performed   PT -  Cognitive impairments: No apparent impairments                         Following commands: Intact      Cueing Cueing Techniques: Verbal cues, Tactile cues  Exercises Other Exercises Other Exercises: standing AROM with walker support.    General Comments        Pertinent Vitals/Pain Pain Assessment Pain Assessment: Faces Faces Pain Scale: Hurts a little bit Pain Location: R hip, pelvis Pain Descriptors / Indicators: Aching, Discomfort, Grimacing, Guarding Pain Intervention(s): Monitored during session, Premedicated before session, Repositioned    Home Living                          Prior Function            PT Goals (current goals can now be found in the care plan section) Progress towards PT goals: Progressing toward goals    Frequency    BID      PT Plan      Co-evaluation              AM-PAC PT 6 Clicks Mobility   Outcome Measure  Help needed turning from your back to your side while in a flat bed without using bedrails?: A Little Help needed moving from lying on your back to sitting on the side of a flat bed without using bedrails?: A Little Help needed moving to and from a bed to a chair (including a wheelchair)?: A Little Help needed standing up from a chair using your arms (e.g., wheelchair or bedside chair)?: A Little Help needed to walk in hospital room?: A Little Help needed climbing 3-5 steps with a railing? : A Lot 6 Click Score: 17    End of Session Equipment Utilized During Treatment: Gait belt Activity Tolerance: Patient tolerated treatment well Patient left: in chair;with call bell/phone within reach;with chair alarm set Nurse Communication: Mobility status PT Visit Diagnosis: Unsteadiness on feet (R26.81);Other abnormalities of gait and mobility (R26.89);Repeated falls (R29.6);Muscle weakness (generalized) (M62.81);History of falling (Z91.81)     Time: 9069-9046 PT Time Calculation (min) (ACUTE ONLY): 23  min  Charges:    $Gait Training: 8-22 mins $Therapeutic Activity: 8-22 mins PT General Charges $$ ACUTE PT VISIT: 1 Visit                   Lauraine Gills, PTA 12/09/23, 11:41 AM

## 2023-12-10 DIAGNOSIS — S72001A Fracture of unspecified part of neck of right femur, initial encounter for closed fracture: Secondary | ICD-10-CM | POA: Diagnosis not present

## 2023-12-10 LAB — CBC
HCT: 28.2 % — ABNORMAL LOW (ref 36.0–46.0)
Hemoglobin: 9.1 g/dL — ABNORMAL LOW (ref 12.0–15.0)
MCH: 29.4 pg (ref 26.0–34.0)
MCHC: 32.3 g/dL (ref 30.0–36.0)
MCV: 91.3 fL (ref 80.0–100.0)
Platelets: 352 K/uL (ref 150–400)
RBC: 3.09 MIL/uL — ABNORMAL LOW (ref 3.87–5.11)
RDW: 13.4 % (ref 11.5–15.5)
WBC: 6 K/uL (ref 4.0–10.5)
nRBC: 0 % (ref 0.0–0.2)

## 2023-12-10 LAB — BASIC METABOLIC PANEL WITH GFR
Anion gap: 10 (ref 5–15)
BUN: 12 mg/dL (ref 8–23)
CO2: 24 mmol/L (ref 22–32)
Calcium: 8.7 mg/dL — ABNORMAL LOW (ref 8.9–10.3)
Chloride: 108 mmol/L (ref 98–111)
Creatinine, Ser: 0.44 mg/dL (ref 0.44–1.00)
GFR, Estimated: >60 mL/min (ref >60)
Glucose, Bld: 99 mg/dL (ref 70–99)
Potassium: 3.8 mmol/L (ref 3.5–5.1)
Sodium: 142 mmol/L (ref 135–145)

## 2023-12-10 NOTE — Progress Notes (Signed)
 Physical Therapy Treatment Patient Details Name: Sandra [name] MRN: [id number] DOB: 11-Dec-1945 Today's Date: 12/10/2023   History of Present Illness Pt is a 78 year old who presented with a R femoral neck fracture + pubic rami fx after a fall s/p R hemiarthroplasty, posterior approach 11/27/23. PMH significant for previous left hip replacement, paroxysmal atrial fibrillation not on anticoagulation, heart failure, previous stroke    PT Comments  Pt received up in chair, pre-medicated prior to session with improved tolerance for mobility. Increased gait distance, however continues to favor Right side due to pelvic discomfort. Transfers have improved to CGA, yet continues to struggle with bed mobility and R LE management. Pt will need to be more functionally independent to safely return home.    If plan is discharge home, recommend the following: A little help with walking and/or transfers;A lot of help with bathing/dressing/bathroom;Assistance with cooking/housework;Assist for transportation;Help with stairs or ramp for entrance   Can travel by private vehicle     No  Equipment Recommendations  Other (comment) (Defer to next level of care)    Recommendations for Other Services       Precautions / Restrictions Precautions Precautions: Posterior Hip;Fall Precaution Booklet Issued: Yes (comment) Recall of Precautions/Restrictions: Intact Precaution/Restrictions Comments: Able to recall 3:3 precautions Required Braces or Orthoses: Knee Immobilizer - Right Knee Immobilizer - Right: Other (comment) (KI on when in bed) Restrictions Weight Bearing Restrictions Per Provider Order: Yes RLE Weight Bearing Per Provider Order: Weight bearing as tolerated     Mobility  Bed Mobility Overal bed mobility: Needs Assistance         Sit to supine: Min assist (Using R LE Leg Lifter to assist into bed)   General bed mobility comments: Attempted leg lifter to assist with L LE management, pt  struggled, will continue with teaching    Transfers Overall transfer level: Needs assistance Equipment used: Rolling walker (2 wheels) Transfers: Sit to/from Stand Sit to Stand: Contact guard assist           General transfer comment: Good technique with precautions    Ambulation/Gait Ambulation/Gait assistance: Contact guard assist Gait Distance (Feet): 130 Feet Assistive device: Rolling walker (2 wheels) Gait Pattern/deviations: Step-to pattern, Antalgic, Decreased stance time - left Gait velocity: decreased     General Gait Details: progressing with mobility.  continues to need +1 for assist for balance and transitions, cues for safety   Stairs             Wheelchair Mobility     Tilt Bed    Modified Rankin (Stroke Patients Only)       Balance Overall balance assessment: Needs assistance Sitting-balance support: Bilateral upper extremity supported, Feet supported Sitting balance-Leahy Scale: Good     Standing balance support: Bilateral upper extremity supported, During functional activity, Reliant on assistive device for balance Standing balance-Leahy Scale: Fair Standing balance comment: Heavy reliance on RW to off load R LE and pelvis                            Communication Communication Communication: No apparent difficulties Factors Affecting Communication: Hearing impaired  Cognition Arousal: Alert Behavior During Therapy: WFL for tasks assessed/performed   PT - Cognitive impairments: No apparent impairments                       PT - Cognition Comments: pleasant and cooperative Following commands: Intact  Cueing Cueing Techniques: Verbal cues, Tactile cues  Exercises Other Exercises Other Exercises: standing AROM with walker support.    General Comments        Pertinent Vitals/Pain Pain Assessment Pain Assessment: 0-10 Pain Score: 7  Pain Location: R hip, pelvis Pain Descriptors / Indicators: Aching,  Discomfort, Grimacing, Guarding Pain Intervention(s): Premedicated before session    Home Living Family/patient expects to be discharged to:: Private residence Living Arrangements: Spouse/significant other Available Help at Discharge: Family Type of Home: House Home Access: Stairs to enter Entrance Stairs-Rails: Can reach both Entrance Stairs-Number of Steps: 2+1 Alternate Level Stairs-Number of Steps: 14 Home Layout: Two level;Able to live on main level with bedroom/bathroom Home Equipment: Rollator (4 wheels);BSC/3in1;Rolling Walker (2 wheels) Additional Comments: Daughter is visiting but lives in [place]; assist from granddaughter who lives nearby PRN    Prior Function            PT Goals (current goals can now be found in the care plan section) Acute Rehab PT Goals Patient Stated Goal: decrease pain Progress towards PT goals: Progressing toward goals    Frequency    BID      PT Plan      Co-evaluation              AM-PAC PT 6 Clicks Mobility   Outcome Measure  Help needed turning from your back to your side while in a flat bed without using bedrails?: A Little Help needed moving from lying on your back to sitting on the side of a flat bed without using bedrails?: A Little Help needed moving to and from a bed to a chair (including a wheelchair)?: A Little Help needed standing up from a chair using your arms (e.g., wheelchair or bedside chair)?: A Little Help needed to walk in hospital room?: A Little Help needed climbing 3-5 steps with a railing? : A Lot 6 Click Score: 17    End of Session Equipment Utilized During Treatment: Gait belt Activity Tolerance: Patient tolerated treatment well Patient left: in bed;with call bell/phone within reach;with bed alarm set Nurse Communication: Mobility status PT Visit Diagnosis: Unsteadiness on feet (R26.81);Other abnormalities of gait and mobility (R26.89);Repeated falls (R29.6);Muscle weakness (generalized)  (M62.81);History of falling (Z91.81)     Time: 1120-1140 PT Time Calculation (min) (ACUTE ONLY): 20 min  Charges:    $Therapeutic Activity: 8-22 mins PT General Charges $$ ACUTE PT VISIT: 1 Visit                    Darice Bohr, PTA  Darice JAYSON Bohr 12/10/2023, 1:42 PM

## 2023-12-10 NOTE — Plan of Care (Signed)
  Problem: Health Behavior/Discharge Planning: Goal: Ability to manage health-related needs will improve Outcome: Progressing   Problem: Clinical Measurements: Goal: Ability to maintain clinical measurements within normal limits will improve Outcome: Progressing Goal: Will remain free from infection Outcome: Progressing   Problem: Activity: Goal: Risk for activity intolerance will decrease Outcome: Progressing   Problem: Nutrition: Goal: Adequate nutrition will be maintained Outcome: Progressing   Problem: Coping: Goal: Level of anxiety will decrease Outcome: Progressing   Problem: Elimination: Goal: Will not experience complications related to bowel motility Outcome: Progressing Goal: Will not experience complications related to urinary retention Outcome: Progressing   Problem: Safety: Goal: Ability to remain free from injury will improve Outcome: Progressing

## 2023-12-10 NOTE — Progress Notes (Signed)
 Physical Therapy Treatment Patient Details Name: Sandra [name] MRN: [id number] DOB: [birth year] Today's Date: 12/10/2023   History of Present Illness Pt is a 78 year old who presented with a R femoral neck fracture + pubic rami fx after a fall s/p R hemiarthroplasty, posterior approach 11/27/23. PMH significant for previous left hip replacement, paroxysmal atrial fibrillation not on anticoagulation, heart failure, previous stroke    PT Comments  Attempted to contact TOC/CM regarding current d/c options without success. Pt continues to improve functionally due to prolonged hospital stay after denial for STR and ongoing appeal. Despite not being at functional baseline, pt may benefit from returning home at this point since she has progressed to a Supervision level for mobility and transfers. Pt still struggles with bed mobility and will need to assess on stairs prior to entertaining the possibility with input from all interdisciplinary team members. Will continue PT tomorrow am.   If plan is discharge home, recommend the following: A little help with walking and/or transfers;A lot of help with bathing/dressing/bathroom;Assistance with cooking/housework;Assist for transportation;Help with stairs or ramp for entrance   Can travel by private vehicle     Yes  Equipment Recommendations  Other (comment) (TBD)    Recommendations for Other Services       Precautions / Restrictions Precautions Precautions: Posterior Hip;Fall Precaution Booklet Issued: Yes (comment) Recall of Precautions/Restrictions: Intact Precaution/Restrictions Comments: Able to recall 3:3 precautions Required Braces or Orthoses: Knee Immobilizer - Right Knee Immobilizer - Right: Other (comment) (When in bed) Restrictions Weight Bearing Restrictions Per Provider Order: Yes RLE Weight Bearing Per Provider Order: Weight bearing as tolerated     Mobility  Bed Mobility Overal bed mobility: Needs Assistance         Sit to  supine: Min assist (Using R LE Leg Lifter to assist into bed)   General bed mobility comments:  (NT)    Transfers Overall transfer level: Needs assistance Equipment used: Rolling walker (2 wheels) Transfers: Sit to/from Stand Sit to Stand: Supervision           General transfer comment: Supervision from bed and raised toilet    Ambulation/Gait Ambulation/Gait assistance: Supervision Gait Distance (Feet):  (160) Assistive device: Rolling walker (2 wheels) Gait Pattern/deviations: Step-to pattern, Step-through pattern, Antalgic Gait velocity: decreased     General Gait Details: Good functional progression with gait distance and wt shifting more onto R LE   Stairs         General stair comments:  (Will attempt stairs in am)   Wheelchair Mobility     Tilt Bed    Modified Rankin (Stroke Patients Only)       Balance Overall balance assessment: Needs assistance Sitting-balance support: Bilateral upper extremity supported, Feet supported Sitting balance-Leahy Scale: Good     Standing balance support: Bilateral upper extremity supported, During functional activity, Reliant on assistive device for balance Standing balance-Leahy Scale: Fair Standing balance comment: Heavy reliance on RW to off load R LE and pelvis                            Communication Communication Communication: No apparent difficulties Factors Affecting Communication: Hearing impaired  Cognition Arousal: Alert Behavior During Therapy: WFL for tasks assessed/performed   PT - Cognitive impairments: No apparent impairments                       PT - Cognition Comments: pleasant and cooperative Following  commands: Intact      Cueing Cueing Techniques: Verbal cues, Tactile cues  Exercises Other Exercises Other Exercises: standing AROM with walker support.    General Comments General comments (skin integrity, edema, etc.):  (Discussed at length possible d/c home vs  STR due to delayed denial for Rehab)      Pertinent Vitals/Pain Pain Assessment Pain Assessment: 0-10 Pain Score: 5  Pain Location: R hip, pelvis Pain Descriptors / Indicators: Aching, Discomfort, Grimacing, Guarding Pain Intervention(s): Premedicated before session    Home Living                          Prior Function            PT Goals (current goals can now be found in the care plan section) Acute Rehab PT Goals Patient Stated Goal: decrease pain Progress towards PT goals: Progressing toward goals    Frequency    BID      PT Plan      Co-evaluation              AM-PAC PT 6 Clicks Mobility   Outcome Measure  Help needed turning from your back to your side while in a flat bed without using bedrails?: A Little Help needed moving from lying on your back to sitting on the side of a flat bed without using bedrails?: A Little Help needed moving to and from a bed to a chair (including a wheelchair)?: A Little Help needed standing up from a chair using your arms (e.g., wheelchair or bedside chair)?: A Little Help needed to walk in hospital room?: A Little Help needed climbing 3-5 steps with a railing? : A Lot 6 Click Score: 17    End of Session Equipment Utilized During Treatment: Gait belt Activity Tolerance: Patient tolerated treatment well Patient left: in bed;with call bell/phone within reach;with bed alarm set Nurse Communication: Mobility status PT Visit Diagnosis: Unsteadiness on feet (R26.81);Other abnormalities of gait and mobility (R26.89);Repeated falls (R29.6);Muscle weakness (generalized) (M62.81);History of falling (Z91.81)     Time: 8360-8341 PT Time Calculation (min) (ACUTE ONLY): 19 min  Charges:    $Therapeutic Activity: 8-22 mins PT General Charges $$ ACUTE PT VISIT: 1 Visit                    Darice Bohr, PTA  Darice JAYSON Bohr 12/10/2023, 5:39 PM

## 2023-12-10 NOTE — TOC Progression Note (Addendum)
 Transition of Care [facility]) - Progression Note    Patient Details  Name: Sandra [name] MRN: [id number] Date of Birth: 1945/06/20  [facility] CM/SW Contact  Delphine KANDICE Bring, RN Phone Number: 12/10/2023, 3:48 PM  Clinical Narrative:    Weekend SW  faxed clinical for the appeal on 12/09/23. CM called [employer] @ [phone]. CM spoke with Cici A. to check status of the appeal. She states that it was approved 7/13-7/19 for [employer]. CM asked for the auth number . She states its the same as the appeal number.[id number].   CM notified the covering CM. CM called Austin @ [employer] to let him know that insurance has approved admission to his facility. CM gave Massie the information. He states that he will verify and plan to admit  to after approval is verified.    Expected Discharge Plan: Skilled Nursing Facility Barriers to Discharge: Continued Medical Work up  Expected Discharge Plan and Services   Discharge Planning Services: CM Consult Post Acute Care Choice: Skilled Nursing Facility Living arrangements for the past 2 months: Single Family Home                   DME Agency: NA       HH Arranged: NA           Social Determinants of Health (SDOH) Interventions SDOH Screenings   Food Insecurity: No Food Insecurity (11/27/2023)  Housing: High Risk (11/27/2023)  Transportation Needs: No Transportation Needs (11/26/2023)  Recent Concern: Transportation Needs - Unmet Transportation Needs (09/10/2023)  Utilities: Not At Risk (11/26/2023)  Recent Concern: Utilities - At Risk (09/10/2023)  Alcohol  Screen: Low Risk  (06/22/2021)  Depression (PHQ2-9): Low Risk  (06/22/2021)  Financial Resource Strain: Low Risk  (05/18/2021)  Physical Activity: Insufficiently Active (05/18/2021)  Social Connections: Unknown (11/27/2023)  Stress: No Stress Concern Present (05/18/2021)  Tobacco Use: Low Risk  (11/27/2023)    Readmission Risk Interventions     No data to  display

## 2023-12-10 NOTE — Progress Notes (Signed)
 PROGRESS NOTE    Sandra [name]   FMW:[id number] DOB: 10-06-1945  DOA: 11/26/2023 Date of Service: 12/10/23 which is hospital day 14  PCP: Sampson Ethridge LABOR, MD    Hospital course / significant events:   HPI: Sandra [name] is a 78 y.o. female with medical history significant for left hip fracture s/p repair in march of this year, presenting with trip and fall at home with right hip pain.   06/30: admitted for R femoral neck fracture, pubic rami fractures. Plan OR tomorrow 07/01  R hip hemiarthroplasty. Eliquis  held pending ortho recs.  07/02-07/09: pending placement 07/10-07/13: appealing SNF denial   Consultants:  Orthopedic surgery   Procedures/Surgeries: 11/27/23 R hip hemiarthroplasty - Dr GORMAN Blanch      ASSESSMENT & PLAN:   Right femoral neck fracture Pubic rami fractures S/p mechanical fall at home. Also with right wrist pain, imaging of right knee, right ankle, and right wrist are negative S/p 11/27/23 R hip hemiarthroplasty   Orthopedic surgery following  Pain control Bowel regimen PT/OT  SNF rehab placement pending - appeal in process    A-fib S/p watchman device, cardioversion April of this year. Sinus rhythm currently cont home amiodarone , metoprolol   Held eliquis  peri-operaively, resume per ortho   Essential HTN  Hydralazine , losartan , metoprolol    GAD home xanax , sertraline    Hypothyroid home synthroid    Hypokalemia Replace as needed Monitor BMP       No concerns based on BMI: Body mass index is 22.14 kg/m.[name] Significantly low or high BMI is associated with higher medical risk.  Underweight - under 18  overweight - 25 to 29 obese - 30 or more Class 1 obesity: BMI of 30.0 to 34 Class 2 obesity: BMI of 35.0 to 39 Class 3 obesity: BMI of 40.0 to 49 Super Morbid Obesity: BMI 50-59 Super-super Morbid Obesity: BMI 60+ Healthy nutrition and physical activity advised as adjunct to other disease management and risk reduction  treatments    DVT prophylaxis: holding home eliquis , can likely restart this per ortho  IV fluids: no continuous IV fluids  Nutrition: regular diet once out of OR Central lines / other devices: none  Code Status: FULL CODE ACP documentation reviewed:  none on file in [facility]  TOC needs: SNF Medical barriers to dispo: none             Subjective / Brief ROS:  No new concerns today Pt reports pain is controlled   Family Communication: none at this time    Objective Findings:  Vitals:   12/09/23 2214 12/10/23 0310 12/10/23 0721 12/10/23 0736  BP: (!) 150/71 (!) 145/66 (!) 150/70 (!) 154/73  Pulse: 64 69 65   Resp: 18 17 18    Temp: 98.2 F (36.8 C) (!) 97.5 F (36.4 C) 98.4 F (36.9 C)   TempSrc: Oral Oral Oral   SpO2: 100% 99% 98%   Weight:      Height:        Intake/Output Summary (Last 24 hours) at 12/10/2023 1547 Last data filed at 12/10/2023 1300 Gross per 24 hour  Intake 720 ml  Output --  Net 720 ml   Filed Weights   11/27/23 0035  Weight: 56.7 kg    Examination:  Physical Exam Constitutional:      General: She is not in acute distress.    Appearance: She is not ill-appearing.  Cardiovascular:     Rate and Rhythm: Normal rate and regular rhythm.  Pulmonary:  Effort: Pulmonary effort is normal.     Breath sounds: Normal breath sounds.  Neurological:     General: No focal deficit present.     Mental Status: She is alert and oriented to person, place, and time.  Psychiatric:        Behavior: Behavior normal.          Scheduled Medications:   acetaminophen   1,000 mg Oral Q8H   amiodarone   100 mg Oral Daily   atorvastatin   40 mg Oral Daily   brimonidine   1 drop Both Eyes BID   docusate sodium   100 mg Oral BID   enoxaparin  (LOVENOX ) injection  40 mg Subcutaneous Q24H   latanoprost   1 drop Both Eyes QHS   levothyroxine   100 mcg Oral Q0600   losartan   75 mg Oral Daily   pantoprazole   40 mg Oral Daily   polyethylene glycol  17  g Oral Daily   potassium chloride   40 mEq Oral Daily   senna  1 tablet Oral BID   sertraline   100 mg Oral Daily    Continuous Infusions:   PRN Medications:  ALPRAZolam , bisacodyl , hydrALAZINE , HYDROmorphone  (DILAUDID ) injection, menthol -cetylpyridinium **OR** phenol, methocarbamol , metoCLOPramide  **OR** metoCLOPramide  (REGLAN ) injection, metoprolol  tartrate, ondansetron  **OR** [DISCONTINUED] ondansetron  (ZOFRAN ) IV, mouth rinse, mouth rinse, oxyCODONE , oxyCODONE , senna-docusate, sodium phosphate , traMADol , zolpidem   Antimicrobials from admission:  Anti-infectives (From admission, onward)    Start     Dose/Rate Route Frequency Ordered Stop   11/27/23 2030  ceFAZolin  (ANCEF ) IVPB 1 g/50 mL premix        1 g 100 mL/hr over 30 Minutes Intravenous Every 6 hours 11/27/23 1710 11/28/23 0400   11/27/23 1200  ceFAZolin  (ANCEF ) IVPB 2g/100 mL premix        2 g 200 mL/hr over 30 Minutes Intravenous  Once 11/26/23 1258 11/27/23 1421           Data Reviewed:  I have personally reviewed the following...  CBC: Recent Labs  Lab 12/10/23 0455  WBC 6.0  HGB 9.1*  HCT 28.2*  MCV 91.3  PLT 352    Basic Metabolic Panel: Recent Labs  Lab 12/10/23 0455  NA 142  K 3.8  CL 108  CO2 24  GLUCOSE 99  BUN 12  CREATININE 0.44  CALCIUM  8.7*    GFR: Estimated Creatinine Clearance: 47.9 mL/min (by C-G formula based on SCr of 0.44 mg/dL). Liver Function Tests: No results for input(s): AST, ALT, ALKPHOS, BILITOT, PROT, ALBUMIN  in the last 168 hours. No results for input(s): LIPASE, AMYLASE in the last 168 hours. No results for input(s): AMMONIA in the last 168 hours. Coagulation Profile: No results for input(s): INR, PROTIME in the last 168 hours. Cardiac Enzymes: No results for input(s): CKTOTAL, CKMB, CKMBINDEX, TROPONINI in the last 168 hours. BNP (last 3 results) No results for input(s): PROBNP in the last 8760 hours. HbA1C: No results for  input(s): HGBA1C in the last 72 hours. CBG: No results for input(s): GLUCAP in the last 168 hours. Lipid Profile: No results for input(s): CHOL, HDL, LDLCALC, TRIG, CHOLHDL, LDLDIRECT in the last 72 hours. Thyroid  Function Tests: No results for input(s): TSH, T4TOTAL, FREET4, T3FREE, THYROIDAB in the last 72 hours. Anemia Panel: No results for input(s): VITAMINB12, FOLATE, FERRITIN, TIBC, IRON , RETICCTPCT in the last 72 hours. Most Recent Urinalysis On File:     Component Value Date/Time   COLORURINE STRAW (A) 09/08/2023 1531   APPEARANCEUR CLEAR (A) 09/08/2023 1531   LABSPEC 1.018 09/08/2023 1531  PHURINE 7.0 09/08/2023 1531   GLUCOSEU NEGATIVE 09/08/2023 1531   HGBUR NEGATIVE 09/08/2023 1531   BILIRUBINUR NEGATIVE 09/08/2023 1531   BILIRUBINUR Negative 11/24/2019 1352   KETONESUR NEGATIVE 09/08/2023 1531   PROTEINUR NEGATIVE 09/08/2023 1531   UROBILINOGEN 0.2 11/24/2019 1352   NITRITE NEGATIVE 09/08/2023 1531   LEUKOCYTESUR NEGATIVE 09/08/2023 1531   Sepsis Labs: @LABRCNTIP (procalcitonin:4,lacticidven:4) Microbiology: No results found for this or any previous visit (from the past 240 hours).     Radiology Studies last 3 days: No results found.         Reilly Blades, DO [facility] 12/10/2023, 3:47 PM    Dictation software may have been used to generate the above note. Typos may occur and escape review in typed/dictated notes. Please contact Dr Marsa directly for clarity if needed.  Staff may message me via secure chat in Epic  but this may not receive an immediate response,  please page me for urgent matters!  If 7PM-7AM, please contact night coverage [email or web address]

## 2023-12-10 NOTE — Anesthesia Postprocedure Evaluation (Signed)
 Anesthesia Post Note  Patient: ANGELLI BARUCH  Procedure(s) Performed: HEMIARTHROPLASTY (BIPOLAR) HIP, POSTERIOR APPROACH FOR FRACTURE (Right: Hip)  Patient location during evaluation: PACU Anesthesia Type: General Level of consciousness: awake and alert Pain management: pain level controlled Vital Signs Assessment: post-procedure vital signs reviewed and stable Respiratory status: spontaneous breathing, nonlabored ventilation, respiratory function stable and patient connected to nasal cannula oxygen Cardiovascular status: blood pressure returned to baseline and stable Postop Assessment: no apparent nausea or vomiting Anesthetic complications: no   No notable events documented.   Last Vitals:  Vitals:   12/10/23 0721 12/10/23 0736  BP: (!) 150/70 (!) 154/73  Pulse: 65   Resp: 18   Temp: 36.9 C   SpO2: 98%     Last Pain:  Vitals:   12/10/23 0721  TempSrc: Oral  PainSc: 0-No pain                 Lynwood KANDICE Clause

## 2023-12-10 NOTE — Progress Notes (Signed)
 Occupational Therapy Treatment Patient Details Name: Sandra [name] MRN: [id number] DOB: 29-Oct-1945 Today's Date: 12/10/2023   History of present illness Pt is a 78 year old who presented with a R femoral neck fracture + pubic rami fx after a fall s/p R hemiarthroplasty, posterior approach 11/27/23. PMH significant for previous left hip replacement, paroxysmal atrial fibrillation not on anticoagulation, heart failure, previous stroke   OT comments  Pt seen for OT tx this date, RN provided pain meds prior to session. Session focused on continued reinforcement of posterior hip precautions during LB dressing. Pt able to complete bed mobility with light MIN A, sits EOB to use sock aide + reacher to doff / don socks + underwear. Pt able to perform STS using RW with CGA, benefits from multimodal cuing for unilateral hand placement on RW for increased stability during LB dressing. Discharge recommendation remains appropriate, OT will continue to follow.       If plan is discharge home, recommend the following:  A lot of help with walking and/or transfers;A lot of help with bathing/dressing/bathroom;Help with stairs or ramp for entrance;Assistance with cooking/housework   Equipment Recommendations  Other (comment)       Precautions / Restrictions Precautions Precautions: Posterior Hip;Fall Precaution Booklet Issued: Yes (comment) Recall of Precautions/Restrictions: Intact Precaution/Restrictions Comments: Able to recall 3:3 precautions Required Braces or Orthoses: Knee Immobilizer - Right Knee Immobilizer - Right: Other (comment) (KI donned when in bed) Restrictions Weight Bearing Restrictions Per Provider Order: Yes RLE Weight Bearing Per Provider Order: Weight bearing as tolerated       Mobility Bed Mobility Overal bed mobility: Needs Assistance Bed Mobility: Supine to Sit     Supine to sit: Min assist Sit to supine: Min assist   General bed mobility comments: pt struggled to use leg  lifter, prefers to lift LE up, will continue to provide edu    Transfers Overall transfer level: Needs assistance Equipment used: Rolling walker (2 wheels) Transfers: Sit to/from Stand Sit to Stand: Contact guard assist           General transfer comment: Good technique with precautions     Balance Overall balance assessment: Needs assistance Sitting-balance support: Bilateral upper extremity supported, Feet supported Sitting balance-Leahy Scale: Good     Standing balance support: Bilateral upper extremity supported, During functional activity, Reliant on assistive device for balance Standing balance-Leahy Scale: Fair Standing balance comment: pt requires cues to maintain unilateral UE support on RW for increased stability while pulling underwear up over hips                           ADL either performed or assessed with clinical judgement   ADL Overall ADL's : Needs assistance/impaired                     Lower Body Dressing: Adhering to hip precautions;Sit to/from stand;Minimal assistance;With adaptive equipment;Cueing for sequencing Lower Body Dressing Details (indicate cue type and reason): pt working on LB dressing with AE. OT provides education and instruction re: reacher and sock aide. pt able to doff socks with reacher and cues for sequencing, dons socks with min A using sock aide. pt benefits from vcs for problem solving throughout. good adherence to hip precautions. pt performs LB dressing from STS with CGA, able to doff underwear while standing and cues to return to seated to pull down over knees.  Functional mobility during ADLs: Cueing for safety;Cueing for sequencing;Rolling walker (2 wheels);Contact guard assist General ADL Comments: continued edu on LB dressing given posterior hip precautions, able to perform STS using RW with CGA.     Communication Communication Communication: No apparent difficulties Factors Affecting  Communication: Hearing impaired   Cognition Arousal: Alert Behavior During Therapy: WFL for tasks assessed/performed Cognition: Cognition impaired       Memory impairment (select all impairments): Short-term memory   Executive functioning impairment (select all impairments): Problem solving OT - Cognition Comments: ?understanding of current level of function. requires edu to implement post hip precautions into LB dressing                 Following commands: Intact        Cueing   Cueing Techniques: Verbal cues, Tactile cues        General Comments RN provided pain meds prior to session, messaged RN regarding KI (pt states KI was soiled and new one has been ordered)    Pertinent Vitals/ Pain       Pain Assessment Pain Assessment: 0-10 Pain Score: 6  Pain Location: R hip, pelvis Pain Descriptors / Indicators: Aching, Discomfort, Grimacing, Guarding Pain Intervention(s): Limited activity within patient's tolerance, Monitored during session, Repositioned, Premedicated before session  Home Living Family/patient expects to be discharged to:: Private residence Living Arrangements: Spouse/significant other Available Help at Discharge: Family Type of Home: House Home Access: Stairs to enter Secretary/administrator of Steps: 2+1 Entrance Stairs-Rails: Can reach both Home Layout: Two level;Able to live on main level with bedroom/bathroom Alternate Level Stairs-Number of Steps: 14 Alternate Level Stairs-Rails: Right Bathroom Shower/Tub: Producer, television/film/video: Handicapped height Bathroom Accessibility: No   Home Equipment: Rollator (4 wheels);BSC/3in1;Rolling Environmental consultant (2 wheels)   Additional Comments: Daughter is visiting but lives in [place]; assist from granddaughter who lives nearby PRN          Frequency  Min 2X/week        Progress Toward Goals  OT Goals(current goals can now be found in the care plan section)  Progress towards OT goals: Progressing  toward goals  Acute Rehab OT Goals OT Goal Formulation: With patient Time For Goal Achievement: 12/12/23 Potential to Achieve Goals: Good  Plan         AM-PAC OT 6 Clicks Daily Activity     Outcome Measure   Help from another person eating meals?: None Help from another person taking care of personal grooming?: None         6 Click Score: 8    End of Session Equipment Utilized During Treatment: Rolling walker (2 wheels);Gait belt  OT Visit Diagnosis: Other abnormalities of gait and mobility (R26.89)   Activity Tolerance Patient tolerated treatment well   Patient Left with call bell/phone within reach;with bed alarm set;in chair   Nurse Communication Mobility status        Time: 1130-1155 OT Time Calculation (min): 25 min  Charges: OT General Charges $OT Visit: 1 Visit OT Treatments $Self Care/Home Management : 23-37 mins Burlie Cajamarca L. Juliane Guest, OTR/L  12/10/23, 1:46 PM

## 2023-12-10 NOTE — Plan of Care (Signed)
  Problem: Education: Goal: Knowledge of General Education information will improve Description: Including pain rating scale, medication(s)/side effects and non-pharmacologic comfort measures Outcome: Progressing   Problem: Health Behavior/Discharge Planning: Goal: Ability to manage health-related needs will improve Outcome: Progressing   Problem: Activity: Goal: Risk for activity intolerance will decrease Outcome: Progressing   Problem: Nutrition: Goal: Adequate nutrition will be maintained Outcome: Progressing   Problem: Coping: Goal: Level of anxiety will decrease Outcome: Progressing   Problem: Elimination: Goal: Will not experience complications related to bowel motility Outcome: Progressing Goal: Will not experience complications related to urinary retention Outcome: Progressing   

## 2023-12-11 DIAGNOSIS — S72001A Fracture of unspecified part of neck of right femur, initial encounter for closed fracture: Secondary | ICD-10-CM | POA: Diagnosis not present

## 2023-12-11 MED ORDER — METHOCARBAMOL 500 MG PO TABS: 500.0000 mg | ORAL_TABLET | Freq: Three times a day (TID) | ORAL | Status: DC | PRN

## 2023-12-11 MED ORDER — POLYETHYLENE GLYCOL 3350 17 G PO PACK: 17.0000 g | PACK | Freq: Every day | ORAL | Status: DC

## 2023-12-11 MED ORDER — SENNOSIDES-DOCUSATE SODIUM 8.6-50 MG PO TABS: 2.0000 | ORAL_TABLET | Freq: Every evening | ORAL | Status: DC | PRN

## 2023-12-11 MED ORDER — ALPRAZOLAM 0.25 MG PO TABS: 0.2500 mg | ORAL_TABLET | Freq: Two times a day (BID) | ORAL | 0 refills | Status: DC | PRN

## 2023-12-11 MED ORDER — FLUTICASONE PROPIONATE 50 MCG/ACT NA SUSP: 2.0000 | Freq: Every day | NASAL | Status: DC | PRN

## 2023-12-11 NOTE — Progress Notes (Signed)
 PROGRESS NOTE    Sandra [name]   FMW:[id number] DOB: [birth year]  DOA: 11/26/2023 Date of Service: 12/11/23 which is hospital day 15  PCP: Sampson Ethridge LABOR, MD    Hospital course / significant events:   HPI: Sandra [name] is a 78 y.o. female with medical history significant for left hip fracture s/p repair in march of this year, presenting with trip and fall at home with right hip pain.   06/30: admitted for R femoral neck fracture, pubic rami fractures. Plan OR tomorrow 07/01  R hip hemiarthroplasty. Eliquis  held pending ortho recs.  07/02-07/09: pending placement 07/10-07/13: appealing SNF denial 07/14: should be good to go to [facility] tomorrow   Consultants:  Orthopedic surgery   Procedures/Surgeries: 11/27/23 R hip hemiarthroplasty - Dr GORMAN Blanch      ASSESSMENT & PLAN:   Right femoral neck fracture Pubic rami fractures S/p mechanical fall at home. Also with right wrist pain, imaging of right knee, right ankle, and right wrist are negative S/p 11/27/23 R hip hemiarthroplasty   Orthopedic surgery following  Pain control Bowel regimen PT/OT  SNF rehab placement pending - appeal in process    A-fib S/p watchman device, cardioversion April of this year. Sinus rhythm currently cont home amiodarone , metoprolol   Held eliquis  peri-operaively, resume per ortho   Essential HTN  Hydralazine , losartan , metoprolol    GAD home xanax , sertraline    Hypothyroid home synthroid    Hypokalemia Replace as needed Monitor BMP       No concerns based on BMI: Body mass index is 22.14 kg/m.[name] Significantly low or high BMI is associated with higher medical risk.  Underweight - under 18  overweight - 25 to 29 obese - 30 or more Class 1 obesity: BMI of 30.0 to 34 Class 2 obesity: BMI of 35.0 to 39 Class 3 obesity: BMI of 40.0 to 49 Super Morbid Obesity: BMI 50-59 Super-super Morbid Obesity: BMI 60+ Healthy nutrition and physical activity advised as  adjunct to other disease management and risk reduction treatments    DVT prophylaxis: holding home eliquis , can likely restart this per ortho  IV fluids: no continuous IV fluids  Nutrition: regular diet once out of OR Central lines / other devices: none  Code Status: FULL CODE ACP documentation reviewed:  none on file in [facility]  TOC needs: SNF Medical barriers to dispo: none             Subjective / Brief ROS:  No new concerns today Pt reports pain is controlled   Family Communication: none at this time    Objective Findings:  Vitals:   12/11/23 0228 12/11/23 0339 12/11/23 0734 12/11/23 1519  BP: (!) 163/70 (!) 154/73 (!) 153/65 119/64  Pulse: 65 67 63 66  Resp: 18  17 18   Temp: 98.1 F (36.7 C) 98 F (36.7 C) 98.3 F (36.8 C)   TempSrc:  Oral Oral   SpO2: 97% 97% 99% 100%  Weight:      Height:        Intake/Output Summary (Last 24 hours) at 12/11/2023 1708 Last data filed at 12/11/2023 1032 Gross per 24 hour  Intake 600 ml  Output --  Net 600 ml   Filed Weights   11/27/23 0035  Weight: 56.7 kg    Examination:  Physical Exam Constitutional:      General: She is not in acute distress.    Appearance: She is not ill-appearing.  Cardiovascular:     Rate and Rhythm: Normal rate and  regular rhythm.  Pulmonary:     Effort: Pulmonary effort is normal.     Breath sounds: Normal breath sounds.  Neurological:     General: No focal deficit present.     Mental Status: She is alert and oriented to person, place, and time.  Psychiatric:        Behavior: Behavior normal.          Scheduled Medications:   acetaminophen   1,000 mg Oral Q8H   amiodarone   100 mg Oral Daily   atorvastatin   40 mg Oral Daily   brimonidine   1 drop Both Eyes BID   docusate sodium   100 mg Oral BID   enoxaparin  (LOVENOX ) injection  40 mg Subcutaneous Q24H   latanoprost   1 drop Both Eyes QHS   levothyroxine   100 mcg Oral Q0600   losartan   75 mg Oral Daily   pantoprazole    40 mg Oral Daily   polyethylene glycol  17 g Oral Daily   potassium chloride   40 mEq Oral Daily   senna  1 tablet Oral BID   sertraline   100 mg Oral Daily    Continuous Infusions:   PRN Medications:  ALPRAZolam , bisacodyl , hydrALAZINE , HYDROmorphone  (DILAUDID ) injection, menthol -cetylpyridinium **OR** phenol, methocarbamol , metoCLOPramide  **OR** metoCLOPramide  (REGLAN ) injection, metoprolol  tartrate, ondansetron  **OR** [DISCONTINUED] ondansetron  (ZOFRAN ) IV, mouth rinse, mouth rinse, oxyCODONE , oxyCODONE , senna-docusate, sodium phosphate , traMADol , zolpidem   Antimicrobials from admission:  Anti-infectives (From admission, onward)    Start     Dose/Rate Route Frequency Ordered Stop   11/27/23 2030  ceFAZolin  (ANCEF ) IVPB 1 g/50 mL premix        1 g 100 mL/hr over 30 Minutes Intravenous Every 6 hours 11/27/23 1710 11/28/23 0400   11/27/23 1200  ceFAZolin  (ANCEF ) IVPB 2g/100 mL premix        2 g 200 mL/hr over 30 Minutes Intravenous  Once 11/26/23 1258 11/27/23 1421           Data Reviewed:  I have personally reviewed the following...  CBC: Recent Labs  Lab 12/10/23 0455  WBC 6.0  HGB 9.1*  HCT 28.2*  MCV 91.3  PLT 352    Basic Metabolic Panel: Recent Labs  Lab 12/10/23 0455  NA 142  K 3.8  CL 108  CO2 24  GLUCOSE 99  BUN 12  CREATININE 0.44  CALCIUM  8.7*    GFR: Estimated Creatinine Clearance: 47.9 mL/min (by C-G formula based on SCr of 0.44 mg/dL). Liver Function Tests: No results for input(s): AST, ALT, ALKPHOS, BILITOT, PROT, ALBUMIN  in the last 168 hours. No results for input(s): LIPASE, AMYLASE in the last 168 hours. No results for input(s): AMMONIA in the last 168 hours. Coagulation Profile: No results for input(s): INR, PROTIME in the last 168 hours. Cardiac Enzymes: No results for input(s): CKTOTAL, CKMB, CKMBINDEX, TROPONINI in the last 168 hours. BNP (last 3 results) No results for input(s): PROBNP in the  last 8760 hours. HbA1C: No results for input(s): HGBA1C in the last 72 hours. CBG: No results for input(s): GLUCAP in the last 168 hours. Lipid Profile: No results for input(s): CHOL, HDL, LDLCALC, TRIG, CHOLHDL, LDLDIRECT in the last 72 hours. Thyroid  Function Tests: No results for input(s): TSH, T4TOTAL, FREET4, T3FREE, THYROIDAB in the last 72 hours. Anemia Panel: No results for input(s): VITAMINB12, FOLATE, FERRITIN, TIBC, IRON , RETICCTPCT in the last 72 hours. Most Recent Urinalysis On File:     Component Value Date/Time   COLORURINE STRAW (A) 09/08/2023 1531   APPEARANCEUR CLEAR (A) 09/08/2023  1531   LABSPEC 1.018 09/08/2023 1531   PHURINE 7.0 09/08/2023 1531   GLUCOSEU NEGATIVE 09/08/2023 1531   HGBUR NEGATIVE 09/08/2023 1531   BILIRUBINUR NEGATIVE 09/08/2023 1531   BILIRUBINUR Negative 11/24/2019 1352   KETONESUR NEGATIVE 09/08/2023 1531   PROTEINUR NEGATIVE 09/08/2023 1531   UROBILINOGEN 0.2 11/24/2019 1352   NITRITE NEGATIVE 09/08/2023 1531   LEUKOCYTESUR NEGATIVE 09/08/2023 1531   Sepsis Labs: @LABRCNTIP (procalcitonin:4,lacticidven:4) Microbiology: No results found for this or any previous visit (from the past 240 hours).     Radiology Studies last 3 days: No results found.         Armanie Martine, DO [facility] 12/11/2023, 5:08 PM    Dictation software may have been used to generate the above note. Typos may occur and escape review in typed/dictated notes. Please contact Dr Marsa directly for clarity if needed.  Staff may message me via secure chat in Epic  but this may not receive an immediate response,  please page me for urgent matters!  If 7PM-7AM, please contact night coverage [email or web address]

## 2023-12-11 NOTE — Plan of Care (Signed)
  Problem: Clinical Measurements: Goal: Ability to maintain clinical measurements within normal limits will improve Outcome: Progressing Goal: Respiratory complications will improve Outcome: Progressing   Problem: Clinical Measurements: Goal: Respiratory complications will improve Outcome: Progressing   Problem: Pain Managment: Goal: General experience of comfort will improve and/or be controlled Outcome: Progressing   Problem: Activity: Goal: Ability to avoid complications of mobility impairment will improve Outcome: Progressing Goal: Ability to tolerate increased activity will improve Outcome: Progressing

## 2023-12-11 NOTE — Progress Notes (Signed)
 Occupational Therapy Treatment Patient Details Name: Sandra [name] MRN: [id number] DOB: Dec 07, 1945 Today's Date: 12/11/2023   History of present illness Pt is a 78 year old who presented with a R femoral neck fracture + pubic rami fx after a fall s/p R hemiarthroplasty, posterior approach 11/27/23. PMH significant for previous left hip replacement, paroxysmal atrial fibrillation not on anticoagulation, heart failure, previous stroke   OT comments  Pt is supine in bed on arrival. Pleasant and agreeable to OT session. She has mild/mod R hip pain. Pt able to recall 3/3 hip precautions and continued education provided on use of DME/AD/AE for ADL performance to adhere to hip precautions. Pt performed supine to sit at EOB with CGA/SBA this date. Pt required CGA/SBA for STS from EOB to RW and ambulation to the bathroom and back. Toileting tasks performed on BSC over toilet with supervision/SBA. Standing oral care performed with CGA/SBA at sink. Pt demo shower transfer with Min/CGA for safety using grab bars and BSC as shower seat to perform bathing tasks. UB bathing seated with set up assist and partial LB bathing with CGA and unilateral support on grab bar. Hair washing with Max A. LB dressing with Min A to adhere to hip precautions and supervision for UB dressing to doff/don gown. Pt returned to seated bed while awaiting hair to dry. She as left with all needs in place and will cont to require skilled acute OT services to maximize her safety and IND to return to PLOF.       If plan is discharge home, recommend the following:  Help with stairs or ramp for entrance;Assistance with cooking/housework;A little help with walking and/or transfers;A little help with bathing/dressing/bathroom   Equipment Recommendations  Other (comment) (defer to next venue)    Recommendations for Other Services      Precautions / Restrictions Precautions Precautions: Posterior Hip;Fall Precaution Booklet Issued: Yes  (comment) Recall of Precautions/Restrictions: Intact Precaution/Restrictions Comments: Able to recall 3:3 precautions Required Braces or Orthoses: Knee Immobilizer - Right Knee Immobilizer - Right: Other (comment) (when in bed) Restrictions Weight Bearing Restrictions Per Provider Order: Yes RLE Weight Bearing Per Provider Order: Weight bearing as tolerated       Mobility Bed Mobility Overal bed mobility: Needs Assistance Bed Mobility: Supine to Sit     Supine to sit: Supervision, Used rails, HOB elevated, Contact guard     General bed mobility comments: increased time, but able to reach EOB without assist    Transfers Overall transfer level: Needs assistance Equipment used: Rolling walker (2 wheels) Transfers: Sit to/from Stand Sit to Stand: Supervision, Contact guard assist           General transfer comment: CGA/SBA for STS then ambulate to the bathroom and back using RW with CGA/SBA     Balance Overall balance assessment: Needs assistance Sitting-balance support: Bilateral upper extremity supported, Feet supported Sitting balance-Leahy Scale: Good     Standing balance support: During functional activity, Single extremity supported Standing balance-Leahy Scale: Fair Standing balance comment: unilateral use of grab bar for standing showering tasks                           ADL either performed or assessed with clinical judgement   ADL Overall ADL's : Needs assistance/impaired     Grooming: Wash/dry hands;Wash/dry face;Oral care;Brushing hair;Standing;Sitting;Supervision/safety   Upper Body Bathing: Set up;Sitting Upper Body Bathing Details (indicate cue type and reason): in shower on [facility] Lower Body  Bathing: Supervison/ safety;Sit to/from stand;Sitting/lateral leans Lower Body Bathing Details (indicate cue type and reason): on BSC in shower Upper Body Dressing : Supervision/safety;Sitting Upper Body Dressing Details (indicate cue type and reason):  to doff/don gown Lower Body Dressing: Adhering to hip precautions;Sit to/from stand;Minimal assistance Lower Body Dressing Details (indicate cue type and reason): to get over feet and within reach to pull up over kness/hips Toilet Transfer: BSC/3in1;Rolling walker (2 wheels);Cueing for safety;Cueing for sequencing;Contact guard assist Toilet Transfer Details (indicate cue type and reason): BSC over toilet in bathroom Toileting- Clothing Manipulation and Hygiene: Supervision/safety;Sit to/from stand;Sitting/lateral lean   Tub/ Shower Transfer: Contact guard assist;Grab bars;BSC/3in1;Cueing for sequencing Tub/Shower Transfer Details (indicate cue type and reason): sat on BSC, used grab bar for balance, cueing for sequencing/safety getting in/out of shower Functional mobility during ADLs: Contact guard assist;Rolling walker (2 wheels) General ADL Comments: continued edu on LB dressing given posterior hip precautions, able to perform STS using RW with CGA and ambulate to the bathroom and back with RW and CGA    Extremity/Trunk Assessment              Vision       Perception     Praxis     Communication Communication Communication: Impaired Factors Affecting Communication: Hearing impaired   Cognition Arousal: Alert Behavior During Therapy: WFL for tasks assessed/performed                                 Following commands: Intact        Cueing   Cueing Techniques: Verbal cues, Tactile cues  Exercises      Shoulder Instructions       General Comments R hip dressing intact pre/post session    Pertinent Vitals/ Pain       Pain Assessment Pain Assessment: 0-10 Pain Score: 5  Pain Location: R hip, pelvis Pain Descriptors / Indicators: Aching, Discomfort, Grimacing, Guarding Pain Intervention(s): Limited activity within patient's tolerance, Monitored during session, Repositioned  Home Living                                           Prior Functioning/Environment              Frequency  Min 2X/week        Progress Toward Goals  OT Goals(current goals can now be found in the care plan section)  Progress towards OT goals: Progressing toward goals  Acute Rehab OT Goals Patient Stated Goal: improve function OT Goal Formulation: With patient Time For Goal Achievement: 12/25/23 Potential to Achieve Goals: Good  Plan      Co-evaluation                 AM-PAC OT 6 Clicks Daily Activity     Outcome Measure   Help from another person eating meals?: None Help from another person taking care of personal grooming?: None Help from another person toileting, which includes using toliet, bedpan, or urinal?: A Little Help from another person bathing (including washing, rinsing, drying)?: A Lot Help from another person to put on and taking off regular upper body clothing?: A Little Help from another person to put on and taking off regular lower body clothing?: A Little 6 Click Score: 19    End of Session Equipment Utilized During Treatment: Rolling walker (  2 wheels)  OT Visit Diagnosis: Other abnormalities of gait and mobility (R26.89)   Activity Tolerance Patient tolerated treatment well   Patient Left with call bell/phone within reach;in bed;with bed alarm set (seated EOB)   Nurse Communication Mobility status        Time: 8881-8846 OT Time Calculation (min): 35 min  Charges: OT General Charges $OT Visit: 1 Visit OT Treatments $Self Care/Home Management : 23-37 mins  Zymarion Favorite, OTR/L  12/11/23, 4:16 PM   Adayah Arocho E Brennan Karam 12/11/2023, 4:13 PM

## 2023-12-11 NOTE — TOC Transition Note (Addendum)
 Transition of Care [facility]) - Discharge Note   Patient Details  Name: Sandra [name] MRN: [id number] Date of Birth: 02-15-1946  Transition of Care [facility]) CM/SW Contact:  Elouise LULLA Capri, RN 12/11/2023, 10:39 AM   Clinical Narrative:     CM call to patient's daughter, [name], phone: [phone] regarding preferred SNF. Per patient's daughter, SNF preferred was [facility], [place], [place]. CM to patient's room to discuss with patient and patient's daughter. Per patient prefers [facility], [place] if [place] allows. CM follow up call placed to [facility], phone: [phone] regarding [facility] and in network status. CM spoke to [place]. Per Colene [facility] SNF is out of network. CM will proceed with coordination of transfer to [facility].    CM call to [facility], Admissions, [facility], phone: [phone], regarding SNF auth and SNF referral. No answer, CM left message for return call.   CM follow up call to [facility], Admissions, [address] [facility]. Per [place], awaiting auth approval confirmation from SNF side and will call CM back.  Barriers to Discharge: Continued Medical Work up   Patient Goals and CMS Choice Patient states their goals for this hospitalization and ongoing recovery are:: get better CMS Medicare.gov Compare Post Acute Care list provided to:: Patient Choice offered to / list presented to : Patient      Discharge Placement     SNF           Discharge Plan and Services Additional resources added to the After Visit Summary for     Discharge Planning Services: CM Consult Post Acute Care Choice: Skilled Nursing Facility            DME Agency: NA     HH Arranged: NA   Social Drivers of Health (SDOH) Interventions SDOH Screenings   Food Insecurity: No Food Insecurity (11/27/2023)  Housing: High Risk (11/27/2023)  Transportation Needs: No Transportation Needs (11/26/2023)  Recent Concern: Transportation Needs  - Unmet Transportation Needs (09/10/2023)  Utilities: Not At Risk (11/26/2023)  Recent Concern: Utilities - At Risk (09/10/2023)  Alcohol  Screen: Low Risk  (06/22/2021)  Depression (PHQ2-9): Low Risk  (06/22/2021)  Financial Resource Strain: Low Risk  (05/18/2021)  Physical Activity: Insufficiently Active (05/18/2021)  Social Connections: Unknown (11/27/2023)  Stress: No Stress Concern Present (05/18/2021)  Tobacco Use: Low Risk  (11/27/2023)     Readmission Risk Interventions     No data to display

## 2023-12-11 NOTE — Progress Notes (Signed)
 Physical Therapy Treatment Patient Details Name: Sandra [name] MRN: [id number] DOB: August 25, 1945 Today's Date: 12/11/2023   History of Present Illness Pt is a 78 year old who presented with a R femoral neck fracture + pubic rami fx after a fall s/p R hemiarthroplasty, posterior approach 11/27/23. PMH significant for previous left hip replacement, paroxysmal atrial fibrillation not on anticoagulation, heart failure, previous stroke    PT Comments  Pt continues to progress well as pelvic pain decreases allowing for increased gait distance and improved ability to transfer from various surfaces. Pt is able to recall 3:3 Posterior Hip Precautions. No LOB with mobility and heavy reliance on RW to off load pelvic pain. Will continue to progress acutely. Awaiting transition to STR prior to returning home.    If plan is discharge home, recommend the following: A little help with walking and/or transfers;A lot of help with bathing/dressing/bathroom;Assistance with cooking/housework;Assist for transportation;Help with stairs or ramp for entrance   Can travel by private vehicle     Yes  Equipment Recommendations  Other (comment) (TBD at next level of care)    Recommendations for Other Services       Precautions / Restrictions Precautions Precautions: Posterior Hip;Fall Precaution Booklet Issued: Yes (comment) Recall of Precautions/Restrictions: Intact Precaution/Restrictions Comments: Able to recall 3:3 precautions Required Braces or Orthoses: Knee Immobilizer - Right Knee Immobilizer - Right: Other (comment) (when in bed) Restrictions Weight Bearing Restrictions Per Provider Order: Yes RLE Weight Bearing Per Provider Order: Weight bearing as tolerated     Mobility  Bed Mobility Overal bed mobility: Needs Assistance Bed Mobility: Supine to Sit, Sit to Supine     Supine to sit: Contact guard, Used rails Sit to supine: Contact guard assist, Used rails   General bed mobility comments:  Improved ability to maneuver R LE up onto bed with heavy reliance on bed rails for leverage    Transfers Overall transfer level: Needs assistance Equipment used: Rolling walker (2 wheels) Transfers: Sit to/from Stand Sit to Stand: Supervision           General transfer comment: Able to stand with heavy reliance from upper body. Pt able to complete BSC transfers and hygiene with Supervision, no LOB with reaching tasks    Ambulation/Gait Ambulation/Gait assistance: Supervision Gait Distance (Feet): 150 Feet Assistive device: Rolling walker (2 wheels) Gait Pattern/deviations: Step-to pattern, Step-through pattern, Antalgic Gait velocity: decreased     General Gait Details: Good functional progression with gait distance and wt shifting more onto R LE   Stairs         General stair comments:  (pt will need to complete stair training prior to d/c home)   Wheelchair Mobility     Tilt Bed    Modified Rankin (Stroke Patients Only)       Balance Overall balance assessment: Needs assistance Sitting-balance support: Bilateral upper extremity supported, Feet supported Sitting balance-Leahy Scale: Good     Standing balance support: Bilateral upper extremity supported, During functional activity, Reliant on assistive device for balance Standing balance-Leahy Scale: Fair                              Musician Communication: Impaired Factors Affecting Communication: Hearing impaired  Cognition Arousal: Alert Behavior During Therapy: WFL for tasks assessed/performed   PT - Cognitive impairments: No apparent impairments  PT - Cognition Comments: pleasant and cooperative Following commands: Intact      Cueing Cueing Techniques: Verbal cues, Tactile cues  Exercises      General Comments General comments (skin integrity, edema, etc.): Right hip dressing intact      Pertinent Vitals/Pain Pain  Assessment Pain Assessment: 0-10 Pain Score: 5  Pain Location: R hip, pelvis Pain Descriptors / Indicators: Aching, Discomfort, Grimacing, Guarding Pain Intervention(s): Limited activity within patient's tolerance    Home Living                          Prior Function            PT Goals (current goals can now be found in the care plan section) Acute Rehab PT Goals Patient Stated Goal: decrease pain Progress towards PT goals: Progressing toward goals    Frequency    BID      PT Plan      Co-evaluation              AM-PAC PT 6 Clicks Mobility   Outcome Measure  Help needed turning from your back to your side while in a flat bed without using bedrails?: A Little Help needed moving from lying on your back to sitting on the side of a flat bed without using bedrails?: A Little Help needed moving to and from a bed to a chair (including a wheelchair)?: A Little Help needed standing up from a chair using your arms (e.g., wheelchair or bedside chair)?: A Little Help needed to walk in hospital room?: A Little Help needed climbing 3-5 steps with a railing? : A Lot 6 Click Score: 17    End of Session Equipment Utilized During Treatment: Gait belt Activity Tolerance: Patient tolerated treatment well Patient left: in bed;with call bell/phone within reach;with bed alarm set Nurse Communication: Mobility status PT Visit Diagnosis: Unsteadiness on feet (R26.81);Other abnormalities of gait and mobility (R26.89);Repeated falls (R29.6);Muscle weakness (generalized) (M62.81);History of falling (Z91.81)     Time: 9049-8985 PT Time Calculation (min) (ACUTE ONLY): 24 min  Charges:    $Gait Training: 8-22 mins $Therapeutic Activity: 8-22 mins PT General Charges $$ ACUTE PT VISIT: 1 Visit                    Darice Bohr, PTA  Darice JAYSON Bohr 12/11/2023, 1:07 PM

## 2023-12-12 DIAGNOSIS — I10 Essential (primary) hypertension: Secondary | ICD-10-CM | POA: Diagnosis not present

## 2023-12-12 DIAGNOSIS — S32599A Other specified fracture of unspecified pubis, initial encounter for closed fracture: Secondary | ICD-10-CM

## 2023-12-12 DIAGNOSIS — I480 Paroxysmal atrial fibrillation: Secondary | ICD-10-CM

## 2023-12-12 DIAGNOSIS — S72001A Fracture of unspecified part of neck of right femur, initial encounter for closed fracture: Secondary | ICD-10-CM | POA: Diagnosis not present

## 2023-12-12 DIAGNOSIS — S32591S Other specified fracture of right pubis, sequela: Secondary | ICD-10-CM

## 2023-12-12 DIAGNOSIS — E876 Hypokalemia: Secondary | ICD-10-CM

## 2023-12-12 DIAGNOSIS — E039 Hypothyroidism, unspecified: Secondary | ICD-10-CM

## 2023-12-12 MED ORDER — OXYCODONE HCL 5 MG PO TABS: 5.0000 mg | ORAL_TABLET | Freq: Four times a day (QID) | ORAL | 0 refills | Status: DC | PRN

## 2023-12-12 MED ORDER — POLYETHYLENE GLYCOL 3350 17 G PO PACK: 17.0000 g | PACK | Freq: Every day | ORAL | 0 refills | Status: DC | PRN

## 2023-12-12 MED ORDER — METHOCARBAMOL 500 MG PO TABS: 500.0000 mg | ORAL_TABLET | Freq: Three times a day (TID) | ORAL | 0 refills | Status: DC | PRN

## 2023-12-12 MED ORDER — ALPRAZOLAM 0.25 MG PO TABS: 0.2500 mg | ORAL_TABLET | Freq: Two times a day (BID) | ORAL | 0 refills | Status: AC | PRN

## 2023-12-12 NOTE — TOC Progression Note (Addendum)
 Transition of Care [facility]) - Progression Note    Patient Details  Name: Sandra [name] MRN: [id number] Date of Birth: 02/23/1946  Transition of Care [facility]) CM/SW Contact  Elouise LULLA Capri, RN Phone Number: 12/12/2023, 11:54 AM  Clinical Narrative:     Alert received from PT, [place], therapy recommendation are now home health PT and a rolling walker. Orders noted for home health PT/OT. CM call to [place] [employer], phone: [phone] regarding rolling walker order for delivery. CM call to [employer], local store, phone: [phone] regarding rolling walker delivery to bedside. CM to patient's room regarding home health PT and home health preferences. Per patient previous home health agency is [facility]. CM call to [place], phone: [phone] regarding home health referral. Per [place], [facility] has accepted patient for home health services. Per patient will arrange transportation. CM will follow up.   Expected Discharge Plan: Skilled Nursing Facility Barriers to Discharge: Continued Medical Work up  Expected Discharge Plan and Services   Discharge Planning Services: CM Consult Post Acute Care Choice: Skilled Nursing Facility Living arrangements for the past 2 months: Single Family Home    DME Agency: NA    HH Arranged: NA   Social Determinants of Health (SDOH) Interventions SDOH Screenings   Food Insecurity: No Food Insecurity (11/27/2023)  Housing: High Risk (11/27/2023)  Transportation Needs: No Transportation Needs (11/26/2023)  Recent Concern: Transportation Needs - Unmet Transportation Needs (09/10/2023)  Utilities: Not At Risk (11/26/2023)  Recent Concern: Utilities - At Risk (09/10/2023)  Alcohol  Screen: Low Risk  (06/22/2021)  Depression (PHQ2-9): Low Risk  (06/22/2021)  Financial Resource Strain: Low Risk  (05/18/2021)  Physical Activity: Insufficiently Active (05/18/2021)  Social Connections: Unknown (11/27/2023)  Stress: No Stress Concern Present (05/18/2021)   Tobacco Use: Low Risk  (11/27/2023)    Readmission Risk Interventions     No data to display

## 2023-12-12 NOTE — Plan of Care (Signed)
  Problem: Education: Goal: Knowledge of General Education information will improve Description: Including pain rating scale, medication(s)/side effects and non-pharmacologic comfort measures Outcome: Adequate for Discharge   Problem: Health Behavior/Discharge Planning: Goal: Ability to manage health-related needs will improve Outcome: Adequate for Discharge   Problem: Clinical Measurements: Goal: Ability to maintain clinical measurements within normal limits will improve Outcome: Adequate for Discharge Goal: Will remain free from infection Outcome: Adequate for Discharge Goal: Diagnostic test results will improve Outcome: Adequate for Discharge Goal: Respiratory complications will improve Outcome: Adequate for Discharge Goal: Cardiovascular complication will be avoided Outcome: Adequate for Discharge   Problem: Activity: Goal: Risk for activity intolerance will decrease Outcome: Adequate for Discharge   Problem: Nutrition: Goal: Adequate nutrition will be maintained Outcome: Adequate for Discharge   Problem: Coping: Goal: Level of anxiety will decrease Outcome: Adequate for Discharge   Problem: Elimination: Goal: Will not experience complications related to bowel motility Outcome: Adequate for Discharge Goal: Will not experience complications related to urinary retention Outcome: Adequate for Discharge   Problem: Pain Managment: Goal: General experience of comfort will improve and/or be controlled Outcome: Adequate for Discharge   Problem: Safety: Goal: Ability to remain free from injury will improve Outcome: Adequate for Discharge   Problem: Skin Integrity: Goal: Risk for impaired skin integrity will decrease Outcome: Adequate for Discharge   Problem: Education: Goal: Knowledge of the prescribed therapeutic regimen will improve Outcome: Adequate for Discharge Goal: Understanding of discharge needs will improve Outcome: Adequate for Discharge Goal:  Individualized Educational Video(s) Outcome: Adequate for Discharge   Problem: Activity: Goal: Ability to avoid complications of mobility impairment will improve Outcome: Adequate for Discharge Goal: Ability to tolerate increased activity will improve Outcome: Adequate for Discharge   Problem: Clinical Measurements: Goal: Postoperative complications will be avoided or minimized Outcome: Adequate for Discharge   Problem: Pain Management: Goal: Pain level will decrease with appropriate interventions Outcome: Adequate for Discharge   Problem: Skin Integrity: Goal: Will show signs of wound healing Outcome: Adequate for Discharge   Problem: Activity: Goal: Ability to avoid complications of mobility impairment will improve Outcome: Adequate for Discharge Goal: Ability to tolerate increased activity will improve Outcome: Adequate for Discharge

## 2023-12-12 NOTE — Discharge Summary (Signed)
 Physician Discharge Summary   Patient: Sandra [name] MRN: [id number] DOB: 1946/02/21  Admit date:     11/26/2023  Discharge date: 12/12/23  Discharge Physician: Charlie Patterson   PCP: Sampson Ethridge LABOR, MD   Recommendations at discharge:   Follow-up with PCP 5 days Follow-up orthopedic surgery  Discharge Diagnoses: Principal Problem:   Femoral neck fracture (HCC) Active Problems:   Atrial fibrillation S/p cardioversion 09/06/23 , prior watchman's device placement   Non-obstructive CAD (coronary artery disease)   Invasive ductal carcinoma of breast, female, right (HCC)   NAFLD (nonalcoholic fatty liver disease)   Chronic diastolic CHF (congestive heart failure) (HCC)  Resolved Problems:   * No resolved hospital problems. [facility] Course: Hospital course / significant events:   HPI: Sandra [name] is a 78 y.o. female with medical history significant for left hip fracture s/p repair in march of this year, presenting with trip and fall at home with right hip pain.   06/30: admitted for R femoral neck fracture, pubic rami fractures. Plan OR tomorrow 07/01  R hip hemiarthroplasty. Eliquis  held pending ortho recs.  07/02-07/09: pending placement 07/10-07/13: appealing SNF denial 07/14: should be good to go to [facility]  07/15: never got confirmation from [facility] about placement 7/16.  Patient did better with physical therapy and patient wants to go home.  Patient walked 300 feet.  Home health ordered.  Few pain pills prescribed.  Patient completed Lovenox  injections while here.  Nursing staff remove staples prior to discharge.   Consultants:  Orthopedic surgery   Procedures/Surgeries: 11/27/23 R hip hemiarthroplasty - Dr GORMAN Blanch      ASSESSMENT & PLAN:   Right femoral neck fracture Pubic rami fractures S/p mechanical fall at home. Also with right wrist pain, imaging of right knee, right ankle, and right wrist are negative S/p 11/27/23 R hip  hemiarthroplasty   Orthopedic surgery have nursing staff to take out the staples prior to discharge. Pain control.  Few pain pills prescribed Bowel regimen PT/OT  Patient did better with physical therapy and walked 300 feet and wanted to go home.   A-fib S/p watchman device, cardioversion April of this year. Sinus rhythm currently continue home amiodarone , metoprolol  and aspirin .  Patient not on Eliquis .   Essential HTN  Hydralazine , losartan , metoprolol    GAD home xanax , sertraline    Hypothyroid home synthroid    Hypokalemia Replaced                 Consultants: Orthopedic surgery Procedures performed: Right hip hemiarthroplasty Disposition: Home health Diet recommendation:  Cardiac diet DISCHARGE MEDICATION: Allergies as of 12/12/2023   No Active Allergies      Medication List     STOP taking these medications    azithromycin  500 MG tablet Commonly known as: ZITHROMAX    Eliquis  2.5 MG Tabs tablet Generic drug: apixaban    multivitamin with minerals Tabs tablet   simethicone  80 MG chewable tablet Commonly known as: MYLICON       TAKE these medications    acetaminophen  325 MG tablet Commonly known as: TYLENOL  2 tablets Oral every 6 hours as needed for mild pain , fever or headache   ALPRAZolam  0.25 MG tablet Commonly known as: XANAX  Take 1 tablet (0.25 mg total) by mouth 2 (two) times daily as needed for anxiety. What changed:  when to take this reasons to take this   amiodarone  200 MG tablet Commonly known as: PACERONE  Take 0.5 tablets (100 mg total) by mouth daily.  atorvastatin  40 MG tablet Commonly known as: LIPITOR Take 1 tablet (40 mg total) by mouth daily.   brimonidine  0.2 % ophthalmic solution Commonly known as: ALPHAGAN  Place 1 drop into both eyes 2 (two) times daily.   Calcium  600 600 MG Tabs tablet Generic drug: calcium  carbonate 1 tablet with food Orally once a day for bone health   Centrum Silver 50+Women  Tabs 1 tablet Orally once a day   docusate sodium  100 MG capsule Commonly known as: COLACE Take 100 mg by mouth daily.   ferrous sulfate 325 (65 FE) MG tablet Take 325 mg by mouth daily with breakfast.   fluticasone  50 MCG/ACT nasal spray Commonly known as: FLONASE  Place 2 sprays into both nostrils daily as needed for allergies or rhinitis. What changed:  when to take this reasons to take this   hydrALAZINE  50 MG tablet Commonly known as: APRESOLINE  Take 1 tablet (50 mg total) by mouth 3 (three) times daily as needed (SBP >160).   latanoprost  0.005 % ophthalmic solution Commonly known as: XALATAN  Place 1 drop into both eyes at bedtime.   levothyroxine  100 MCG tablet Commonly known as: SYNTHROID  TAKE 1 TABLET BY MOUTH 30  MINUTES BEFORE BREAKFAST   losartan  50 MG tablet Commonly known as: COZAAR  Take 1.5 tablets (75 mg total) by mouth daily.   methocarbamol  500 MG tablet Commonly known as: ROBAXIN  Take 1 tablet (500 mg total) by mouth every 8 (eight) hours as needed for muscle spasms.   metoprolol  tartrate 25 MG tablet Commonly known as: LOPRESSOR  Take 0.5 tablets (12.5 mg total) by mouth as needed (Afib episodes with HR > 120bpm.).   omeprazole  40 MG capsule Commonly known as: PRILOSEC TAKE 1 CAPSULE BY MOUTH DAILY   oxyCODONE  5 MG immediate release tablet Commonly known as: Oxy IR/ROXICODONE  Take 1 tablet (5 mg total) by mouth every 6 (six) hours as needed for severe pain (pain score 7-10) (pain score 7-10).   polyethylene glycol 17 g packet Commonly known as: MIRALAX  / GLYCOLAX  Take 17 g by mouth daily as needed for moderate constipation.   potassium chloride  20 MEQ packet Commonly known as: KLOR-CON  DISSOLVE 2 PACKETS IN 4 OZ WATER OR JUICE AND DRINK DAILY   sertraline  100 MG tablet Commonly known as: ZOLOFT  Take 100 mg by mouth daily.   Vitamin D3 125 MCG (5000 UT) Tabs Take 5,000 Units by mouth daily.               Durable Medical Equipment   (From admission, onward)           Start     Ordered   12/12/23 1057  For home use only DME Walker rolling  Once       Comments: PLEASE DELIVER TO BEDSIDE  Question Answer Comment  Walker: With 5 Inch Wheels   Patient needs a walker to treat with the following condition Generalized weakness      12/12/23 1056            Contact information for follow-up providers     Verlinda Boas, PA-C Follow up in 2 week(s).   Specialty: Orthopedic Surgery Why: For staple removal and x-rays of the right hip Contact information: [address] [place] [address] [phone]         [facility] Follow up.   Why: 07/16---Per James/Rebecca--will process rolling walker order for bedside delivery. Contact information: [address] [place] [address] [phone]  [facility] Follow up.   Why: 07/16--Per Dor[facility] will provide HHPT/OT Contact information: [address] [place] [address] [phone]              Contact information for after-discharge care     Destination     [facility] .   Service: Skilled Nursing Contact information: [address] [place]   [address] [phone]                    Discharge Exam: Fredricka Weights   11/27/23 0035  Weight: 56.7 kg   Physical Exam HENT:     Head: Normocephalic.  Eyes:     General: Lids are normal.  Cardiovascular:     Rate and Rhythm: Normal rate and regular rhythm.     Heart sounds: Normal heart sounds, S1 normal and S2 normal.  Pulmonary:     Breath sounds: No decreased breath sounds, wheezing, rhonchi or rales.  Abdominal:     Palpations: Abdomen is soft.     Tenderness: There is no abdominal tenderness.  Musculoskeletal:     Right lower leg: No swelling.     Left lower leg: No swelling.  Skin:    General: Skin is warm.     Findings: No rash.  Neurological:      Mental Status: She is alert and oriented to person, place, and time.      Condition at discharge: stable  The results of significant diagnostics from this hospitalization (including imaging, microbiology, ancillary and laboratory) are listed below for reference.   Imaging Studies: CUP PACEART REMOTE DEVICE CHECK Result Date: 12/03/2023 ILR summary report received. Battery status OK. Normal device function. No new symptom, tachy, brady, or pause episodes. No new AF episodes. Monthly summary reports and ROV/PRN. MC, CVRS  DG Pelvis Portable Result Date: 11/27/2023 CLINICAL DATA:  Status post hemiarthroplasty right. EXAM: PORTABLE PELVIS 1-2 VIEWS COMPARISON:  Hip radiograph yesterday. FINDINGS: Right hip hemiarthroplasty in expected alignment. No periprosthetic lucency or fracture. Recent postsurgical change includes air and edema in the soft tissues. Overlying skin staples in place. Mildly displaced right superior and inferior pubic rami fractures. Previous left hip arthroplasty IMPRESSION: 1. Right hip hemiarthroplasty without immediate postoperative complication. 2. Mildly displaced right superior and inferior pubic rami fractures. Electronically Signed   By: Andrea Gasman M.D.   On: 11/27/2023 16:52   DG Pelvis Portable Result Date: 11/27/2023 CLINICAL DATA:  Elective surgery. EXAM: PORTABLE PELVIS 1-2 VIEWS COMPARISON:  Radiograph 11/26/2023 FINDINGS: Single intraoperative spot view of the pelvis submitted from the operating room. Image obtained during right hip surgery. Previous left hip arthroplasty. Fractures of the right superior and inferior pubic rami are faintly visualized. IMPRESSION: Intraoperative spot view of the pelvis during right hip surgery. Electronically Signed   By: Andrea Gasman M.D.   On: 11/27/2023 15:21   DG Chest Portable 1 View Result Date: 11/26/2023 CLINICAL DATA:  Preop exam EXAM: PORTABLE CHEST 1 VIEW COMPARISON:  Chest x-ray performed Sep 27, 2023 FINDINGS:  Interstitial airspace opacities are present. Heart mediastinum are not significantly changed. The left atrial occlusion device is present. IMPRESSION: 1. Interstitial opacities, possibly sequelae of pulmonary vascular congestion. Electronically Signed   By: Maude Naegeli M.D.   On: 11/26/2023 08:12   CT Head Wo Contrast Result Date: 11/26/2023 CLINICAL DATA:  Head and neck trauma during a fall. EXAM: CT HEAD WITHOUT CONTRAST CT CERVICAL SPINE WITHOUT CONTRAST TECHNIQUE: Multidetector CT  imaging of the head and cervical spine was performed following the standard protocol without intravenous contrast. Multiplanar CT image reconstructions of the cervical spine were also generated. RADIATION DOSE REDUCTION: This exam was performed according to the departmental dose-optimization program which includes automated exposure control, adjustment of the mA and/or kV according to patient size and/or use of iterative reconstruction technique. COMPARISON:  Head CT 06/06/2023 FINDINGS: CT HEAD FINDINGS Brain: There is no evidence for acute hemorrhage, hydrocephalus, mass lesion, or abnormal extra-axial fluid collection. No definite CT evidence for acute infarction. Diffuse loss of parenchymal volume is consistent with atrophy. Patchy low attenuation in the deep hemispheric and periventricular white matter is nonspecific, but likely reflects chronic microvascular ischemic demyelination. Vascular: No hyperdense vessel or unexpected calcification. Skull: No evidence for fracture. No worrisome lytic or sclerotic lesion. Sinuses/Orbits: The visualized paranasal sinuses and mastoid air cells are clear. Visualized portions of the globes and intraorbital fat are unremarkable. Other: None. CT CERVICAL SPINE FINDINGS Alignment: Straightening of normal cervical lordosis without evidence for traumatic subluxation. Trace anterolisthesis of C3 on 4 is compatible with degenerative disease. Skull base and vertebrae: No acute fracture. No primary  bone lesion or focal pathologic process. Soft tissues and spinal canal: No prevertebral fluid or swelling. No visible canal hematoma. Disc levels: Loss of disc height with endplate spurring noted C5-6 and C6-7. Upper chest: No acute findings. Other: None IMPRESSION: 1. No acute intracranial abnormality. 2. Atrophy with chronic small vessel white matter ischemic disease. 3. No cervical spine fracture or traumatic subluxation. Electronically Signed   By: Camellia Candle M.D.   On: 11/26/2023 07:38   CT Cervical Spine Wo Contrast Result Date: 11/26/2023 CLINICAL DATA:  Head and neck trauma during a fall. EXAM: CT HEAD WITHOUT CONTRAST CT CERVICAL SPINE WITHOUT CONTRAST TECHNIQUE: Multidetector CT imaging of the head and cervical spine was performed following the standard protocol without intravenous contrast. Multiplanar CT image reconstructions of the cervical spine were also generated. RADIATION DOSE REDUCTION: This exam was performed according to the departmental dose-optimization program which includes automated exposure control, adjustment of the mA and/or kV according to patient size and/or use of iterative reconstruction technique. COMPARISON:  Head CT 06/06/2023 FINDINGS: CT HEAD FINDINGS Brain: There is no evidence for acute hemorrhage, hydrocephalus, mass lesion, or abnormal extra-axial fluid collection. No definite CT evidence for acute infarction. Diffuse loss of parenchymal volume is consistent with atrophy. Patchy low attenuation in the deep hemispheric and periventricular white matter is nonspecific, but likely reflects chronic microvascular ischemic demyelination. Vascular: No hyperdense vessel or unexpected calcification. Skull: No evidence for fracture. No worrisome lytic or sclerotic lesion. Sinuses/Orbits: The visualized paranasal sinuses and mastoid air cells are clear. Visualized portions of the globes and intraorbital fat are unremarkable. Other: None. CT CERVICAL SPINE FINDINGS Alignment:  Straightening of normal cervical lordosis without evidence for traumatic subluxation. Trace anterolisthesis of C3 on 4 is compatible with degenerative disease. Skull base and vertebrae: No acute fracture. No primary bone lesion or focal pathologic process. Soft tissues and spinal canal: No prevertebral fluid or swelling. No visible canal hematoma. Disc levels: Loss of disc height with endplate spurring noted C5-6 and C6-7. Upper chest: No acute findings. Other: None IMPRESSION: 1. No acute intracranial abnormality. 2. Atrophy with chronic small vessel white matter ischemic disease. 3. No cervical spine fracture or traumatic subluxation. Electronically Signed   By: Camellia Candle M.D.   On: 11/26/2023 07:38   DG Wrist Complete Right Result Date: 11/26/2023 CLINICAL DATA:  Fall.  Pain. EXAM: RIGHT WRIST - COMPLETE 3+ VIEW COMPARISON:  None Available. FINDINGS: Status post ORIF for distal radius fracture. No hardware complication. Chronic posttraumatic deformity noted ulnar styloid. No evidence for an acute fracture or dislocation. Bones are diffusely demineralized. IMPRESSION: 1. Status post ORIF for distal radius fracture. No hardware complication. 2. No acute bony findings. Electronically Signed   By: Camellia Candle M.D.   On: 11/26/2023 07:31   DG Ankle 2 Views Right Result Date: 11/26/2023 CLINICAL DATA:  Mechanical fall.  Pain. EXAM: RIGHT ANKLE - 2 VIEW COMPARISON:  None Available. FINDINGS: There is no evidence of fracture, dislocation, or joint effusion. There is no evidence of arthropathy or other focal bone abnormality. Soft tissues are unremarkable. IMPRESSION: Negative. Electronically Signed   By: Camellia Candle M.D.   On: 11/26/2023 07:29   DG Knee 2 Views Right Result Date: 11/26/2023 CLINICAL DATA:  Mechanical fall.  Pain.  EXAM: RIGHT KNEE - 1-2 VIEW COMPARISON:  None Available. FINDINGS: Two views study shows no acute fracture or dislocation. Loss of joint space noted medial compartment with  degenerative spurring in all 3 compartments. Calcification of the lateral meniscus evident. IMPRESSION: Tricompartmental degenerative changes without acute bony findings. Electronically Signed   By: Camellia Candle M.D.   On: 11/26/2023 07:29   DG Hip Unilat W or Wo Pelvis 2-3 Views Right Result Date: 11/26/2023 CLINICAL DATA:  Status post fall.  Pain. EXAM: DG HIP (WITH OR WITHOUT PELVIS) 2-3V RIGHT COMPARISON:  07/20/2023 FINDINGS: Interval left hip replacement. Acute comminuted fracture noted right superior pubic ramus with associated acute fracture of the inferior right pubic ramus. There is also an acute fracture in the right femoral neck with some valgus deformity. IMPRESSION: 1. Acute fractures of the right superior and inferior pubic rami. 2. Acute fracture in the right femoral neck with some valgus deformity. Electronically Signed   By: Camellia Candle M.D.   On: 11/26/2023 07:26    Microbiology: Results for orders placed or performed during the hospital encounter of 11/26/23  Surgical PCR screen     Status: None   Collection Time: 11/26/23  9:49 PM   Specimen: Nasal Mucosa; Nasal Swab  Result Value Ref Range Status   MRSA, PCR NEGATIVE NEGATIVE Final   Staphylococcus aureus NEGATIVE NEGATIVE Final    Comment: (NOTE) The Xpert SA Assay (FDA approved for NASAL specimens in patients 44 years of age and older), is one component of a comprehensive surveillance program. It is not intended to diagnose infection nor to guide or monitor treatment. Performed at [facility], [address] [place], [place] [address]     Labs: CBC: Recent Labs  Lab 12/10/23 0455  WBC 6.0  HGB 9.1*  HCT 28.2*  MCV 91.3  PLT 352   Basic Metabolic Panel: Recent Labs  Lab 12/10/23 0455  NA 142  K 3.8  CL 108  CO2 24  GLUCOSE 99  BUN 12  CREATININE 0.44  CALCIUM  8.7*   Liver Function Tests: No results for input(s): AST, ALT, ALKPHOS, BILITOT, PROT, ALBUMIN  in the last 168  hours. CBG: No results for input(s): GLUCAP in the last 168 hours.  Discharge time spent: greater than 30 minutes.  Signed: Charlie Patterson, MD [facility] 12/12/2023

## 2023-12-12 NOTE — Plan of Care (Signed)

## 2023-12-12 NOTE — Progress Notes (Signed)
 Physical Therapy Treatment Patient Details Name: Sandra [name] MRN: [id number] DOB: Feb 19, 1946 Today's Date: 12/12/2023   History of Present Illness Pt is a 78 year old who presented with a R femoral neck fracture + pubic rami fx after a fall s/p R hemiarthroplasty, posterior approach 11/27/23. PMH significant for previous left hip replacement, paroxysmal atrial fibrillation not on anticoagulation, heart failure, previous stroke    PT Comments  Pt seen for PT tx with pt agreeable. Pt is progressing well with mobility, ambulating to rehab gym & back with RW & supervision with gait pattern as noted below, no LOB during activity. Pt negotiates steps with B rails & supervision with pt recalling compensatory pattern. Practiced bed mobility from elevated EOB with pt able to step onto step to access higher mattress. Have updated d/c recommendations with pt agreeable.    If plan is discharge home, recommend the following: A little help with walking and/or transfers;A little help with bathing/dressing/bathroom;Assistance with cooking/housework;Assist for transportation;Help with stairs or ramp for entrance   Can travel by private vehicle     Yes  Equipment Recommendations  None recommended by PT (pt has RW)    Recommendations for Other Services       Precautions / Restrictions Precautions Precautions: Fall;Posterior Hip Required Braces or Orthoses: Knee Immobilizer - Right Knee Immobilizer - Right: Other (comment) (when in bed) Restrictions Weight Bearing Restrictions Per Provider Order: Yes RLE Weight Bearing Per Provider Order: Weight bearing as tolerated     Mobility  Bed Mobility Overal bed mobility: Needs Assistance Bed Mobility: Supine to Sit, Sit to Supine     Supine to sit: Supervision Sit to supine: Supervision   General bed mobility comments: bed flat, no rails for supine<>sit, does use UE to assist RLE onto bed 2/2 weakness & pain. Pt reports she sleeps in elevated bed &  uses stoll to get into bed. Utilized step for pt to access elevated hospital bed; PT educated pt on technique for step negotiation backwards with RW with pt ascending step to get into bed with supervision.    Transfers Overall transfer level: Needs assistance Equipment used: Rolling walker (2 wheels) Transfers: Sit to/from Stand Sit to Stand: Supervision           General transfer comment: sit<>stand from recliner    Ambulation/Gait Ambulation/Gait assistance: Supervision Gait Distance (Feet): 300 Feet Assistive device: Rolling walker (2 wheels) Gait Pattern/deviations: Decreased step length - left, Decreased step length - right, Decreased stride length Gait velocity: slightly decreased     General Gait Details: decreased R hip flexion during swing phase, no LOB noted   Stairs Stairs: Yes Stairs assistance: Supervision Stair Management: Two rails, Step to pattern (pt reports she has BUE hand support when negotiating either set of steps at home.) Number of Stairs: 4 General stair comments: Pt able to recall compensatory pattern from previous injury; able to demonstrate compensatory pattern without issue.   Wheelchair Mobility     Tilt Bed    Modified Rankin (Stroke Patients Only)       Balance Overall balance assessment: Needs assistance Sitting-balance support: No upper extremity supported, Feet supported Sitting balance-Leahy Scale: Good     Standing balance support: Single extremity supported, During functional activity   Standing balance comment: Pt engaged in petting therapy dog while standing in hallway without LOB, 1 UE support on RW.  Communication    Cognition Arousal: Alert Behavior During Therapy: WFL for tasks assessed/performed   PT - Cognitive impairments: No apparent impairments                       PT - Cognition Comments: pleasant and cooperative Following commands: Intact      Cueing  Cueing Techniques: Verbal cues  Exercises      General Comments General comments (skin integrity, edema, etc.): Educated pt on use of RW bag to transport items, need to use RW vs rollator, discussed activity modifications & relying on caregiver support until pt returns to PLOF. Discussed various d/c dispositions with pt.      Pertinent Vitals/Pain Pain Assessment Pain Assessment: 0-10 Pain Score: 5  Pain Location: R hip, pelvis/low back Pain Descriptors / Indicators: Discomfort Pain Intervention(s): Monitored during session, Repositioned, RN gave pain meds during session    Home Living                          Prior Function            PT Goals (current goals can now be found in the care plan section) Acute Rehab PT Goals Patient Stated Goal: decrease pain PT Goal Formulation: With patient/family Time For Goal Achievement: 12/12/23 Potential to Achieve Goals: Good Progress towards PT goals: Progressing toward goals    Frequency    7X/week (downgrade 2/2 progress)      PT Plan      Co-evaluation              AM-PAC PT 6 Clicks Mobility   Outcome Measure  Help needed turning from your back to your side while in a flat bed without using bedrails?: None Help needed moving from lying on your back to sitting on the side of a flat bed without using bedrails?: A Little Help needed moving to and from a bed to a chair (including a wheelchair)?: A Little Help needed standing up from a chair using your arms (e.g., wheelchair or bedside chair)?: A Little Help needed to walk in hospital room?: A Little Help needed climbing 3-5 steps with a railing? : A Little 6 Click Score: 19    End of Session   Activity Tolerance: Patient tolerated treatment well Patient left: in bed;with call bell/phone within reach;with bed alarm set Nurse Communication: Mobility status PT Visit Diagnosis: Unsteadiness on feet (R26.81);Other abnormalities of gait and mobility  (R26.89);Repeated falls (R29.6);Muscle weakness (generalized) (M62.81);History of falling (Z91.81);Pain Pain - Right/Left: Right Pain - part of body: Hip     Time: 9063-8975 PT Time Calculation (min) (ACUTE ONLY): 48 min  Charges:    $Therapeutic Activity: 38-52 mins PT General Charges $$ ACUTE PT VISIT: 1 Visit                     Richerd Pinal, PT, DPT 12/12/23, 10:45 AM   Richerd CHRISTELLA Pinal 12/12/2023, 10:44 AM

## 2023-12-12 NOTE — TOC Transition Note (Signed)
 Transition of Care [facility]) - Discharge Note   Patient Details  Name: Sandra [name] MRN: [id number] Date of Birth: 01-20-1946  Transition of Care [facility]) CM/SW Contact:  Elouise LULLA Capri, RN 12/12/2023, 2:22 PM   Clinical Narrative:     Discharge orders noted. Secure message from [place], [facility], agency is out of network with patient's insurance. CM call to Dorothe [facility], phone: [phone] regarding home health PT/OT. Per Dorothe [facility] will provide HHPT/OT.   CM call to patient's phone; [phone] regarding [facility] providing HHPT/OT. Per patient, verbalized understanding and agreement.   Final next level of care: Home w Home Health Services Barriers to Discharge: No Barriers Identified   Patient Goals and CMS Choice Patient states their goals for this hospitalization and ongoing recovery are:: get better CMS Medicare.gov Compare Post Acute Care list provided to:: Patient Choice offered to / list presented to : Patient    Discharge Placement      Home with home health PT/OT          Discharge Plan and Services Additional resources added to the After Visit Summary for     Discharge Planning Services: CM Consult Post Acute Care Choice: Skilled Nursing Facility          DME Arranged: Vannie rolling DME Agency: [facility] Date DME Agency Contacted: 12/12/23 Time DME Agency Contacted: 1130 Representative spoke with at DME Agency: James/Rebecca HH Arranged: PT, OT HH Agency: [facility] Date [facility] Agency Contacted: 12/12/23 Time HH Agency Contacted: 1420 Representative spoke with at [facility] Agency: Dorothe  Social Drivers of Health (SDOH) Interventions SDOH Screenings   Food Insecurity: No Food Insecurity (11/27/2023)  Housing: High Risk (11/27/2023)  Transportation Needs: No Transportation Needs (11/26/2023)  Recent Concern: Transportation Needs - Unmet Transportation Needs (09/10/2023)  Utilities: Not At Risk  (11/26/2023)  Recent Concern: Utilities - At Risk (09/10/2023)  Alcohol  Screen: Low Risk  (06/22/2021)  Depression (PHQ2-9): Low Risk  (06/22/2021)  Financial Resource Strain: Low Risk  (05/18/2021)  Physical Activity: Insufficiently Active (05/18/2021)  Social Connections: Unknown (11/27/2023)  Stress: No Stress Concern Present (05/18/2021)  Tobacco Use: Low Risk  (11/27/2023)     Readmission Risk Interventions     No data to display

## 2023-12-19 NOTE — Progress Notes (Signed)
 [facility] Summary Report / Loop Recorder

## 2024-01-03 ENCOUNTER — Ambulatory Visit (INDEPENDENT_AMBULATORY_CARE_PROVIDER_SITE_OTHER): Payer: Medicare (Managed Care)

## 2024-01-03 DIAGNOSIS — I495 Sick sinus syndrome: Secondary | ICD-10-CM

## 2024-01-03 LAB — CUP PACEART REMOTE DEVICE CHECK
Date Time Interrogation Session: 20250806232713
Implantable Pulse Generator Implant Date: 20240522

## 2024-01-04 ENCOUNTER — Ambulatory Visit: Payer: Self-pay | Admitting: Cardiology

## 2024-02-04 ENCOUNTER — Ambulatory Visit (INDEPENDENT_AMBULATORY_CARE_PROVIDER_SITE_OTHER): Payer: Medicare (Managed Care)

## 2024-02-04 DIAGNOSIS — I495 Sick sinus syndrome: Secondary | ICD-10-CM | POA: Diagnosis not present

## 2024-02-04 LAB — CUP PACEART REMOTE DEVICE CHECK
Date Time Interrogation Session: 20250906231903
Implantable Pulse Generator Implant Date: 20240522

## 2024-02-10 ENCOUNTER — Ambulatory Visit: Payer: Self-pay | Admitting: Cardiology

## 2024-02-14 NOTE — Progress Notes (Signed)
 Remote Loop Recorder Transmission

## 2024-02-23 NOTE — Progress Notes (Signed)
 Remote Loop Recorder Transmission

## 2024-03-07 ENCOUNTER — Ambulatory Visit (INDEPENDENT_AMBULATORY_CARE_PROVIDER_SITE_OTHER): Payer: Medicare (Managed Care)

## 2024-03-07 DIAGNOSIS — I495 Sick sinus syndrome: Secondary | ICD-10-CM | POA: Diagnosis not present

## 2024-03-07 LAB — CUP PACEART REMOTE DEVICE CHECK
Date Time Interrogation Session: 20251009232610
Implantable Pulse Generator Implant Date: 20240522

## 2024-03-08 ENCOUNTER — Telehealth: Payer: Self-pay | Admitting: Cardiothoracic Surgery

## 2024-03-08 NOTE — Telephone Encounter (Signed)
 Called with AFib  No further Afib since 4/10 cardioversion Started w palpitations with lightheadedness around 3pm BP 138/86, p138 around 5pm Now p 110-120s  Metoprolol  taken 7pm (25mg ) Still on amiodarone  100 daily, losartan  50mg .  Recommend: - hydrating tonight - metoprolol  50 mg dose x 1 - get some rest  If symptoms worsen, come to ED  Andee Flatten MD Cards on call

## 2024-03-10 ENCOUNTER — Ambulatory Visit: Payer: Self-pay | Admitting: Cardiology

## 2024-03-10 ENCOUNTER — Telehealth: Payer: Self-pay

## 2024-03-10 ENCOUNTER — Encounter (HOSPITAL_COMMUNITY): Payer: Self-pay | Admitting: Internal Medicine

## 2024-03-10 ENCOUNTER — Ambulatory Visit (HOSPITAL_COMMUNITY)
Admission: RE | Admit: 2024-03-10 | Discharge: 2024-03-10 | Disposition: A | Discharge status: Home / Self Care | Payer: Medicare (Managed Care) | Source: Ambulatory Visit | Attending: Internal Medicine | Admitting: Internal Medicine

## 2024-03-10 VITALS — BP 122/82 | HR 95 | Ht 63.0 in | Wt 118.8 lb

## 2024-03-10 DIAGNOSIS — I480 Paroxysmal atrial fibrillation: Secondary | ICD-10-CM | POA: Diagnosis not present

## 2024-03-10 DIAGNOSIS — Z5181 Encounter for therapeutic drug level monitoring: Secondary | ICD-10-CM | POA: Diagnosis not present

## 2024-03-10 DIAGNOSIS — D6869 Other thrombophilia: Secondary | ICD-10-CM

## 2024-03-10 DIAGNOSIS — Z79899 Other long term (current) drug therapy: Secondary | ICD-10-CM

## 2024-03-10 DIAGNOSIS — I4819 Other persistent atrial fibrillation: Secondary | ICD-10-CM | POA: Diagnosis not present

## 2024-03-10 MED ORDER — AMIODARONE HCL 200 MG PO TABS: 200.0000 mg | ORAL_TABLET | Freq: Every day | ORAL | 0 refills | Status: DC

## 2024-03-10 NOTE — Progress Notes (Addendum)
 Primary Care Physician: Sampson Ethridge LABOR, MD Primary Cardiologist: Dr End  Primary Electrophysiologist: Dr Cindie Referring Physician: Dr Cindie Niels [name] Sandra is a 78 y.o. female with a history of intracranial hemorrhage, HTN, CAD, and atrial fibrillation who presents for follow up in the [facility]. Patient has a CHADS2VASC score of 5. Patient was seen by Dr Cindie for [facility] device consideration. She is not felt to be a candidate for long term anticoagulation due to intracranial hemorrhage while on Eliquis  06/2020. Eliquis  was reversed at that time. She was taken off anticoagulation but remained on ASA. She is s/p Watchman implant on 01/27/21. Patient reported on her follow up 03/03/21 that she was having more frequent episodes of afib with symptoms of SOB, dizziness, and fatigue. Her flecainide  was increased but her symptoms worsened. Flecainide  was discontinued on 03/11/21 and she has not had any symptoms since.   On follow up 03/10/24, patient is currently in Afib. Patient contacted office on 10/11 noting palpitations and advised to take metoprolol  50 mg x 1 dose. Device clinic alert on 10/13 noting Afib in progress from 10/13 at 0103. She takes amiodarone  100 mg daily and Lopressor  PRN. Review of ILR shows patient having frequent paroxysmal Afib episodes since 10/11; no previous Afib since April. She is not on anticoagulation due to Watchman procedure and history of intracranial hemorrhage.   Today, she denies symptoms of palpitations, chest pain, shortness of breath, orthopnea, PND, lower extremity edema, dizziness, presyncope, syncope, snoring, daytime somnolence, bleeding, or neurologic sequela. The patient is tolerating medications without difficulties and is otherwise without complaint today.    Atrial Fibrillation Risk Factors:  she does not have symptoms or diagnosis of sleep apnea. she does not have a history of rheumatic fever.   she has a  BMI of Body mass index is 21.04 kg/m.[name] Filed Weights   03/10/24 1525  Weight: 53.9 kg     Family History  Problem Relation Age of Onset   Cancer Mother    Anemia Mother    Other Mother        lymphosarcoma   Alcohol  abuse Father    Prostate cancer Father    Breast cancer Sister        16; 1/2 sister, shared mother.   Cancer Sister        bile duct   Stroke Maternal Grandmother    Breast cancer Maternal Grandmother 68   Heart attack Maternal Grandfather    CAD Paternal Grandfather      Atrial Fibrillation Management history:  Previous antiarrhythmic drugs: flecainide , amiodarone  Previous cardioversions: 09/06/23 Previous ablations: none CHADS2VASC score: 5 Anticoagulation history: Eliquis    Past Medical History:  Diagnosis Date   (HFpEF) heart failure with preserved ejection fraction (HCC)    a. 08/2019 Echo: EF 60-65%, no rwma, mild LVH, Gr2 DD, nl RV fxn. Nl pASP. Mildly dil LA. Mild to mod MR.   Anemia    Anxiety    Arthritis    BRCA gene mutation negative 10/2016   NEGATIVE: [employer]   Breast cancer (HCC) 10/16/2016   T1c,N0;, GRADE I/III, 1.6cm. ER/PR pos  HER2 not over expressed, Right Upper Outer   Celiac disease    Celiac syndrome    Cerebellar hemorrhage, acute (HCC) 07/17/2020   CHF (congestive heart failure) (HCC)    Depression    Dyspnea    WITH EXERTION   Fatty liver    GERD (gastroesophageal reflux disease)    OCC  Glaucoma    right eye   Headache    H/O MIGRAINES AS TEENAGER.   Heart murmur    a. 08/2019 Echo: mild to mod MR.   Hypertension    Hypothyroidism    Non-obstructive CAD (coronary artery disease)    a. 01/2017 MV: Hypertensive response. No ischemia/infarct. EF >65%; b. 09/2019 Cor CTA: Ca2+ = 9.29 (36th percentile). LAD calcified plaque (0-24%), otw nl. Multipel bilat pulm nodules up to 7mm.   Osteoporosis    PAF (paroxysmal atrial fibrillation) (HCC)    a. 02/2020 Zio: predominantly RSR, 65 (50-105), rare PACs/PVCs, 8 beats  NSVT, multiple episodes of PAF lasting up to 1hr . Avg AF rate 130 (93-170). AF burden <1%. Triggered events = RSR, PACs, and PAF; b. CHA2DS2VASc = 6.   Pneumonia    YEARS AGO   Pre-diabetes    Presence of Watchman left atrial appendage closure device 01/2021   Pulmonary nodules    a. 09/2019 Cor CTA: incidental finding of multiple bilat pulm nodules; b. 12/2019 High Res CT: stable, scattered solid pulm nodules.   Raynaud's disease    RLS (restless legs syndrome)    Stroke (HCC) 06/2020   mild balance issues   Wears hearing aid in both ears    Past Surgical History:  Procedure Laterality Date   ABDOMINAL HYSTERECTOMY     APPENDECTOMY     AXILLARY LYMPH NODE BIOPSY Right 03/05/2017   Procedure: AXILLARY LYMPH NODE BIOPSY;  Surgeon: Dessa Reyes ORN, MD;  Location: [facility] ORS;  Service: General;  Laterality: Right;   CARDIOVERSION N/A 09/06/2023   Procedure: CARDIOVERSION;  Surgeon: Perla Evalene PARAS, MD;  Location: [facility] ORS;  Service: Cardiovascular;  Laterality: N/A;   CATARACT EXTRACTION Bilateral    CESAREAN SECTION     COLONOSCOPY WITH PROPOFOL  N/A 05/19/2015   Procedure: COLONOSCOPY WITH PROPOFOL ;  Surgeon: Lamar ONEIDA Holmes, MD;  Location: [facility] END[facility];  Service: Endoscopy;  Laterality: N/A;   COLONOSCOPY WITH PROPOFOL  N/A 08/01/2019   Procedure: COLONOSCOPY WITH PROPOFOL ;  Surgeon: Jinny Carmine, MD;  Location: [facility] ENDOSCOPY;  Service: Endoscopy;  Laterality: N/A;   EYE SURGERY     HAND SURGERY Left    HIP ARTHROPLASTY Left 07/21/2023   Procedure: ARTHROPLASTY BIPOLAR HIP (HEMIARTHROPLASTY);  Surgeon: Cleotilde Barrio, MD;  Location: [facility] ORS;  Service: Orthopedics;  Laterality: Left;   HIP ARTHROPLASTY Right 11/27/2023   Procedure: HEMIARTHROPLASTY (BIPOLAR) HIP, POSTERIOR APPROACH FOR FRACTURE;  Surgeon: Tobie Priest, MD;  Location: [facility] ORS;  Service: Orthopedics;  Laterality: Right;   LEFT ATRIAL APPENDAGE OCCLUSION N/A 01/27/2021   Procedure: LEFT ATRIAL APPENDAGE OCCLUSION;   Surgeon: Cindie Ole ONEIDA, MD;  Location: [facility] INVASIVE CV LAB;  Service: Cardiovascular;  Laterality: N/A;   MASTECTOMY Right 11/07/2016   RESIDUAL INVASIVE MAMMARY CARCINOMA, SUBAREOLAR ANTERIOR TO PREVIOUS    OPEN REDUCTION INTERNAL FIXATION (ORIF) DISTAL RADIAL FRACTURE Right 04/11/2022   Procedure: OPEN REDUCTION INTERNAL FIXATION (ORIF) DISTAL RADIUS FRACTURE;  Surgeon: Kathlynn Sharper, MD;  Location: [facility];  Service: Orthopedics;  Laterality: Right;   OPEN REDUCTION INTERNAL FIXATION (ORIF) DISTAL RADIAL FRACTURE Left 08/04/2022   Procedure: OPEN REDUCTION INTERNAL FIXATION (ORIF) DISTAL RADIUS FRACTURE;  Surgeon: Kathlynn Sharper, MD;  Location: [facility];  Service: Orthopedics;  Laterality: Left;   SENTINEL NODE BIOPSY Right 11/07/2016   Procedure: SENTINEL NODE BIOPSY;  Surgeon: Dessa Reyes ORN, MD;  Location: [facility] ORS;  Service: General;  Laterality: Right;   SIMPLE MASTECTOMY WITH AXILLARY SENTINEL NODE BIOPSY Right 11/07/2016  6 mm ER/PR 100%; Her 2 neu not overexpressed, T1b, N0.  Surgeon: Dessa Reyes ORN, MD;  Location: [facility] ORS;  Service: General;  Laterality: Right;   TEE WITHOUT CARDIOVERSION N/A 01/27/2021   Procedure: TRANSESOPHAGEAL ECHOCARDIOGRAM (TEE);  Surgeon: Cindie Ole DASEN, MD;  Location: [facility] INVASIVE CV LAB;  Service: Cardiovascular;  Laterality: N/A;   TEE WITHOUT CARDIOVERSION N/A 03/10/2021   Procedure: TRANSESOPHAGEAL ECHOCARDIOGRAM (TEE);  Surgeon: Loni Soyla LABOR, MD;  Location: [facility] ENDOSCOPY;  Service: Cardiology;  Laterality: N/A;   TONSILLECTOMY  AGE 11   UPPER GI ENDOSCOPY  01/12/06   hiatus hernia    Current Outpatient Medications  Medication Sig Dispense Refill   acetaminophen  (TYLENOL ) 325 MG tablet 2 tablets Oral every 6 hours as needed for mild pain , fever or headache     ALPRAZolam  (XANAX ) 0.25 MG tablet Take 1 tablet (0.25 mg total) by mouth 2 (two) times daily as needed for anxiety. 10 tablet 0   amiodarone  (PACERONE ) 200  MG tablet Take 0.5 tablets (100 mg total) by mouth daily. 45 tablet 3   atorvastatin  (LIPITOR) 40 MG tablet Take 1 tablet (40 mg total) by mouth daily. 90 tablet 3   brimonidine  (ALPHAGAN ) 0.2 % ophthalmic solution Place 1 drop into both eyes 2 (two) times daily.     calcium  carbonate (CALCIUM  600) 600 MG TABS tablet 1 tablet with food Orally once a day for bone health     Cholecalciferol  (VITAMIN D3) 125 MCG (5000 UT) TABS Take 5,000 Units by mouth daily.     docusate sodium  (COLACE) 100 MG capsule Take 100 mg by mouth daily.     ferrous sulfate 325 (65 FE) MG tablet Take 325 mg by mouth daily with breakfast.     latanoprost  (XALATAN ) 0.005 % ophthalmic solution Place 1 drop into both eyes at bedtime.  5   levothyroxine  (SYNTHROID ) 100 MCG tablet TAKE 1 TABLET BY MOUTH 30  MINUTES BEFORE BREAKFAST 90 tablet 3   losartan  (COZAAR ) 50 MG tablet Take 1.5 tablets (75 mg total) by mouth daily. 135 tablet 3   metoprolol  tartrate (LOPRESSOR ) 25 MG tablet Take 0.5 tablets (12.5 mg total) by mouth as needed (Afib episodes with HR > 120bpm.). 30 tablet 3   Multiple Vitamins-Minerals (CENTRUM SILVER 50+WOMEN) TABS 1 tablet Orally once a day     omeprazole  (PRILOSEC) 40 MG capsule TAKE 1 CAPSULE BY MOUTH DAILY 90 capsule 3   potassium chloride  (KLOR-CON ) 20 MEQ packet DISSOLVE 2 PACKETS IN 4 OZ WATER OR JUICE AND DRINK DAILY 200 each 3   sertraline  (ZOLOFT ) 100 MG tablet Take 100 mg by mouth daily.     No current facility-administered medications for this encounter.    No Known Allergies  ROS- All systems are reviewed and negative except as per the HPI above.  Physical Exam: Vitals:   03/10/24 1525  BP: 122/82  Pulse: 95  Weight: 53.9 kg  Height: 5' 3 (1.6 m)    GEN- The patient is well appearing, alert and oriented x 3 today.   Neck - no JVD or carotid bruit noted Lungs- Clear to ausculation bilaterally, normal work of breathing Heart- Irregular rate and rhythm, no murmurs, rubs or gallops,  PMI not laterally displaced Extremities- no clubbing, cyanosis, or edema Skin - no rash or ecchymosis noted   Wt Readings from Last 3 Encounters:  03/10/24 53.9 kg  11/27/23 56.7 kg  09/27/23 55.4 kg    EKG today demonstrates  Vent. rate 95 BPM  PR interval * ms QRS duration 88 ms QT/QTcB 340/427 ms P-R-T axes * 40 236 Atrial fibrillation Left ventricular hypertrophy with repolarization abnormality ( Sokolow-Lyon , Cornell product ) Cannot rule out Septal infarct , age undetermined Abnormal ECG When compared with ECG of 26-Nov-2023 07:47, PREVIOUS ECG IS PRESENT  Echo 09/09/23: 1. Left ventricular ejection fraction, by estimation, is 65 to 70%. The  left ventricle has normal function. The left ventricle has no regional  wall motion abnormalities. There is mild left ventricular hypertrophy.  Left ventricular diastolic parameters  are indeterminate.   2. Right ventricular systolic function is normal. The right ventricular  size is normal. There is moderately elevated pulmonary artery systolic  pressure. The estimated right ventricular systolic pressure is 49.2 mmHg.   3. The mitral valve is degenerative. Mild to moderate mitral valve  regurgitation. No evidence of mitral stenosis.   4. Tricuspid valve regurgitation is mild to moderate.   5. The aortic valve is tricuspid. There is mild thickening of the aortic  valve. Aortic valve regurgitation is not visualized. Aortic valve  sclerosis is present, with no evidence of aortic valve stenosis.   6. The inferior vena cava is normal in size with greater than 50%  respiratory variability, suggesting right atrial pressure of 3 mmHg.    Epic records are reviewed at length today   CHA2DS2-VASc Score = 5  The patient's score is based upon: CHF History: 0 HTN History: 1 Diabetes History: 0 Stroke History: 0 Vascular Disease History: 1 Age Score: 2 Gender Score: 1      ASSESSMENT AND PLAN: 1. Persistent Atrial Fibrillation /  Atrial flutter (ICD10:  I48.0) The patient's CHA2DS2-VASc score is 5, indicating a 7.2% annual risk of stroke.   S/p Watchman implant 01/27/21  Patient is currently in Afib. We discussed with ERAF patient's rhythm control may benefit from increase. Due to history of Watchman procedure and ICH, will cautiously begin short term anticoagulation in preparation for cardioversion / increase in amiodarone . Plan will be to begin Eliquis  5 mg BID for 3 doses prior to cardioversion and continue for 30 days post cardioversion. Patient will increase amiodarone  to 200 mg daily on day of cardioversion (after 3 doses of Eliquis ). We discussed the procedure cardioversion to try to convert to NSR. We discussed the risks vs benefits of this procedure and how ultimately we cannot predict whether a patient will have early return of arrhythmia post procedure. After discussion, the patient wishes to proceed with cardioversion. Labs drawn today.   Informed Consent   Shared Decision Making/Informed Consent The risks (stroke, cardiac arrhythmias rarely resulting in the need for a temporary or permanent pacemaker, skin irritation or burns and complications associated with conscious sedation including aspiration, arrhythmia, respiratory failure and death), benefits (restoration of normal sinus rhythm) and alternatives of a direct current cardioversion were explained in detail to Ms. Rolston and she agrees to proceed.      High risk medication monitoring (ICD10: [id number]) Patient requires ongoing monitoring for anti-arrhythmic medication which has the potential to cause life threatening arrhythmias or AV block. Qtc stable. Continue amiodarone  100 mg daily. She will increase to amiodarone  200 mg daily after 3 doses of Eliquis  5 mg.   2. Secondary Hypercoagulable State (ICD10:  D68.69) The patient is at significant risk for stroke/thromboembolism based upon her CHA2DS2-VASc Score of 5.  Continue Apixaban  (Eliquis ).  S/p Watchman  procedure on 01/27/21.  3. HTN Stable today.   4. CAD No anginal symptoms.  Follow up 2 weeks after DCCV.    Dorn Heinrich, [facility] [address] [place], [place] [address] [phone] 03/10/2024 3:48 PM

## 2024-03-10 NOTE — Patient Instructions (Addendum)
 Increase amiodarone  to 200 mg daily   Start Eliquis  5 mg twice a day for one month   You may call us  and cancel follow up if you have one scheduled in [facility]   Cardioversion scheduled for: 03/12/24 Wednesday 03/12/24 7:30 am   - Arrive at the [facility] ([address])  and check in with ADMITTING at 7:30 am    - Do not eat or drink anything after midnight the night prior to your procedure.   - Take all your morning medication (except diabetic medications) with a sip of water prior to arrival.  - Do NOT miss any doses of your blood thinner - if you should miss a dose or take a dose more than 4 hours late -- please notify our office immediately.  - You will not be able to drive home after your procedure. Please ensure you have a responsible adult to drive you home. You will need someone with you for 24 hours post procedure.     - Expect to be in the procedural area approximately 2 hours.   - If you feel as if you go back into normal rhythm prior to scheduled cardioversion, please notify our office immediately.   If your procedure is canceled in the cardioversion suite you will be charged a cancellation fee.

## 2024-03-10 NOTE — H&P (View-Only) (Signed)
 Primary Care Physician: Sampson Ethridge LABOR, MD Primary Cardiologist: Dr End  Primary Electrophysiologist: Dr Cindie Referring Physician: Dr Cindie Niels Sandra [name] is a 78 y.o. female with a history of intracranial hemorrhage, HTN, CAD, and atrial fibrillation who presents for follow up in the [facility]. Patient has a CHADS2VASC score of 5. Patient was seen by Dr Cindie for [facility] device consideration. She is not felt to be a candidate for long term anticoagulation due to intracranial hemorrhage while on Eliquis  06/2020. Eliquis  was reversed at that time. She was taken off anticoagulation but remained on ASA. She is s/p Watchman implant on 01/27/21. Patient reported on her follow up 03/03/21 that she was having more frequent episodes of afib with symptoms of SOB, dizziness, and fatigue. Her flecainide  was increased but her symptoms worsened. Flecainide  was discontinued on 03/11/21 and she has not had any symptoms since.   On follow up 03/10/24, patient is currently in Afib. Patient contacted office on 10/11 noting palpitations and advised to take metoprolol  50 mg x 1 dose. Device clinic alert on 10/13 noting Afib in progress from 10/13 at 0103. She takes amiodarone  100 mg daily and Lopressor  PRN. Review of ILR shows patient having frequent paroxysmal Afib episodes since 10/11; no previous Afib since April. She is not on anticoagulation due to Watchman procedure and history of intracranial hemorrhage.   Today, she denies symptoms of palpitations, chest pain, shortness of breath, orthopnea, PND, lower extremity edema, dizziness, presyncope, syncope, snoring, daytime somnolence, bleeding, or neurologic sequela. The patient is tolerating medications without difficulties and is otherwise without complaint today.    Atrial Fibrillation Risk Factors:  she does not have symptoms or diagnosis of sleep apnea. she does not have a history of rheumatic fever.   she has a  BMI of Body mass index is 21.04 kg/m.[name] Filed Weights   03/10/24 1525  Weight: 53.9 kg     Family History  Problem Relation Age of Onset   Cancer Mother    Anemia Mother    Other Mother        lymphosarcoma   Alcohol  abuse Father    Prostate cancer Father    Breast cancer Sister        16; 1/2 sister, shared mother.   Cancer Sister        bile duct   Stroke Maternal Grandmother    Breast cancer Maternal Grandmother 68   Heart attack Maternal Grandfather    CAD Paternal Grandfather      Atrial Fibrillation Management history:  Previous antiarrhythmic drugs: flecainide , amiodarone  Previous cardioversions: 09/06/23 Previous ablations: none CHADS2VASC score: 5 Anticoagulation history: Eliquis    Past Medical History:  Diagnosis Date   (HFpEF) heart failure with preserved ejection fraction (HCC)    a. 08/2019 Echo: EF 60-65%, no rwma, mild LVH, Gr2 DD, nl RV fxn. Nl pASP. Mildly dil LA. Mild to mod MR.   Anemia    Anxiety    Arthritis    BRCA gene mutation negative 10/2016   NEGATIVE: [employer]   Breast cancer (HCC) 10/16/2016   T1c,N0;, GRADE I/III, 1.6cm. ER/PR pos  HER2 not over expressed, Right Upper Outer   Celiac disease    Celiac syndrome    Cerebellar hemorrhage, acute (HCC) 07/17/2020   CHF (congestive heart failure) (HCC)    Depression    Dyspnea    WITH EXERTION   Fatty liver    GERD (gastroesophageal reflux disease)    OCC  Glaucoma    right eye   Headache    H/O MIGRAINES AS TEENAGER.   Heart murmur    a. 08/2019 Echo: mild to mod MR.   Hypertension    Hypothyroidism    Non-obstructive CAD (coronary artery disease)    a. 01/2017 MV: Hypertensive response. No ischemia/infarct. EF >65%; b. 09/2019 Cor CTA: Ca2+ = 9.29 (36th percentile). LAD calcified plaque (0-24%), otw nl. Multipel bilat pulm nodules up to 7mm.   Osteoporosis    PAF (paroxysmal atrial fibrillation) (HCC)    a. 02/2020 Zio: predominantly RSR, 65 (50-105), rare PACs/PVCs, 8 beats  NSVT, multiple episodes of PAF lasting up to 1hr . Avg AF rate 130 (93-170). AF burden <1%. Triggered events = RSR, PACs, and PAF; b. CHA2DS2VASc = 6.   Pneumonia    YEARS AGO   Pre-diabetes    Presence of Watchman left atrial appendage closure device 01/2021   Pulmonary nodules    a. 09/2019 Cor CTA: incidental finding of multiple bilat pulm nodules; b. 12/2019 High Res CT: stable, scattered solid pulm nodules.   Raynaud's disease    RLS (restless legs syndrome)    Stroke (HCC) 06/2020   mild balance issues   Wears hearing aid in both ears    Past Surgical History:  Procedure Laterality Date   ABDOMINAL HYSTERECTOMY     APPENDECTOMY     AXILLARY LYMPH NODE BIOPSY Right 03/05/2017   Procedure: AXILLARY LYMPH NODE BIOPSY;  Surgeon: Dessa Reyes ORN, MD;  Location: [facility] ORS;  Service: General;  Laterality: Right;   CARDIOVERSION N/A 09/06/2023   Procedure: CARDIOVERSION;  Surgeon: Perla Evalene PARAS, MD;  Location: [facility] ORS;  Service: Cardiovascular;  Laterality: N/A;   CATARACT EXTRACTION Bilateral    CESAREAN SECTION     COLONOSCOPY WITH PROPOFOL  N/A 05/19/2015   Procedure: COLONOSCOPY WITH PROPOFOL ;  Surgeon: Lamar ONEIDA Holmes, MD;  Location: [facility];  Service: Endoscopy;  Laterality: N/A;   COLONOSCOPY WITH PROPOFOL  N/A 08/01/2019   Procedure: COLONOSCOPY WITH PROPOFOL ;  Surgeon: Jinny Carmine, MD;  Location: [facility] ENDOSCOPY;  Service: Endoscopy;  Laterality: N/A;   EYE SURGERY     HAND SURGERY Left    HIP ARTHROPLASTY Left 07/21/2023   Procedure: ARTHROPLASTY BIPOLAR HIP (HEMIARTHROPLASTY);  Surgeon: Cleotilde Barrio, MD;  Location: [facility] ORS;  Service: Orthopedics;  Laterality: Left;   HIP ARTHROPLASTY Right 11/27/2023   Procedure: HEMIARTHROPLASTY (BIPOLAR) HIP, POSTERIOR APPROACH FOR FRACTURE;  Surgeon: Tobie Priest, MD;  Location: [facility] ORS;  Service: Orthopedics;  Laterality: Right;   LEFT ATRIAL APPENDAGE OCCLUSION N/A 01/27/2021   Procedure: LEFT ATRIAL APPENDAGE OCCLUSION;   Surgeon: Cindie Ole ONEIDA, MD;  Location: [facility] INVASIVE CV LAB;  Service: Cardiovascular;  Laterality: N/A;   MASTECTOMY Right 11/07/2016   RESIDUAL INVASIVE MAMMARY CARCINOMA, SUBAREOLAR ANTERIOR TO PREVIOUS    OPEN REDUCTION INTERNAL FIXATION (ORIF) DISTAL RADIAL FRACTURE Right 04/11/2022   Procedure: OPEN REDUCTION INTERNAL FIXATION (ORIF) DISTAL RADIUS FRACTURE;  Surgeon: Kathlynn Sharper, MD;  Location: [facility];  Service: Orthopedics;  Laterality: Right;   OPEN REDUCTION INTERNAL FIXATION (ORIF) DISTAL RADIAL FRACTURE Left 08/04/2022   Procedure: OPEN REDUCTION INTERNAL FIXATION (ORIF) DISTAL RADIUS FRACTURE;  Surgeon: Kathlynn Sharper, MD;  Location: [facility];  Service: Orthopedics;  Laterality: Left;   SENTINEL NODE BIOPSY Right 11/07/2016   Procedure: SENTINEL NODE BIOPSY;  Surgeon: Dessa Reyes ORN, MD;  Location: [facility] ORS;  Service: General;  Laterality: Right;   SIMPLE MASTECTOMY WITH AXILLARY SENTINEL NODE BIOPSY Right 11/07/2016  6 mm ER/PR 100%; Her 2 neu not overexpressed, T1b, N0.  Surgeon: Dessa Reyes ORN, MD;  Location: [facility] ORS;  Service: General;  Laterality: Right;   TEE WITHOUT CARDIOVERSION N/A 01/27/2021   Procedure: TRANSESOPHAGEAL ECHOCARDIOGRAM (TEE);  Surgeon: Cindie Ole DASEN, MD;  Location: [facility] INVASIVE CV LAB;  Service: Cardiovascular;  Laterality: N/A;   TEE WITHOUT CARDIOVERSION N/A 03/10/2021   Procedure: TRANSESOPHAGEAL ECHOCARDIOGRAM (TEE);  Surgeon: Loni Soyla LABOR, MD;  Location: [facility] ENDOSCOPY;  Service: Cardiology;  Laterality: N/A;   TONSILLECTOMY  AGE 11   UPPER GI ENDOSCOPY  01/12/06   hiatus hernia    Current Outpatient Medications  Medication Sig Dispense Refill   acetaminophen  (TYLENOL ) 325 MG tablet 2 tablets Oral every 6 hours as needed for mild pain , fever or headache     ALPRAZolam  (XANAX ) 0.25 MG tablet Take 1 tablet (0.25 mg total) by mouth 2 (two) times daily as needed for anxiety. 10 tablet 0   amiodarone  (PACERONE ) 200  MG tablet Take 0.5 tablets (100 mg total) by mouth daily. 45 tablet 3   atorvastatin  (LIPITOR) 40 MG tablet Take 1 tablet (40 mg total) by mouth daily. 90 tablet 3   brimonidine  (ALPHAGAN ) 0.2 % ophthalmic solution Place 1 drop into both eyes 2 (two) times daily.     calcium  carbonate (CALCIUM  600) 600 MG TABS tablet 1 tablet with food Orally once a day for bone health     Cholecalciferol  (VITAMIN D3) 125 MCG (5000 UT) TABS Take 5,000 Units by mouth daily.     docusate sodium  (COLACE) 100 MG capsule Take 100 mg by mouth daily.     ferrous sulfate 325 (65 FE) MG tablet Take 325 mg by mouth daily with breakfast.     latanoprost  (XALATAN ) 0.005 % ophthalmic solution Place 1 drop into both eyes at bedtime.  5   levothyroxine  (SYNTHROID ) 100 MCG tablet TAKE 1 TABLET BY MOUTH 30  MINUTES BEFORE BREAKFAST 90 tablet 3   losartan  (COZAAR ) 50 MG tablet Take 1.5 tablets (75 mg total) by mouth daily. 135 tablet 3   metoprolol  tartrate (LOPRESSOR ) 25 MG tablet Take 0.5 tablets (12.5 mg total) by mouth as needed (Afib episodes with HR > 120bpm.). 30 tablet 3   Multiple Vitamins-Minerals (CENTRUM SILVER 50+WOMEN) TABS 1 tablet Orally once a day     omeprazole  (PRILOSEC) 40 MG capsule TAKE 1 CAPSULE BY MOUTH DAILY 90 capsule 3   potassium chloride  (KLOR-CON ) 20 MEQ packet DISSOLVE 2 PACKETS IN 4 OZ WATER OR JUICE AND DRINK DAILY 200 each 3   sertraline  (ZOLOFT ) 100 MG tablet Take 100 mg by mouth daily.     No current facility-administered medications for this encounter.    No Known Allergies  ROS- All systems are reviewed and negative except as per the HPI above.  Physical Exam: Vitals:   03/10/24 1525  BP: 122/82  Pulse: 95  Weight: 53.9 kg  Height: 5' 3 (1.6 m)    GEN- The patient is well appearing, alert and oriented x 3 today.   Neck - no JVD or carotid bruit noted Lungs- Clear to ausculation bilaterally, normal work of breathing Heart- Irregular rate and rhythm, no murmurs, rubs or gallops,  PMI not laterally displaced Extremities- no clubbing, cyanosis, or edema Skin - no rash or ecchymosis noted   Wt Readings from Last 3 Encounters:  03/10/24 53.9 kg  11/27/23 56.7 kg  09/27/23 55.4 kg    EKG today demonstrates  Vent. rate 95 BPM  PR interval * ms QRS duration 88 ms QT/QTcB 340/427 ms P-R-T axes * 40 236 Atrial fibrillation Left ventricular hypertrophy with repolarization abnormality ( Sokolow-Lyon , Cornell product ) Cannot rule out Septal infarct , age undetermined Abnormal ECG When compared with ECG of 26-Nov-2023 07:47, PREVIOUS ECG IS PRESENT  Echo 09/09/23: 1. Left ventricular ejection fraction, by estimation, is 65 to 70%. The  left ventricle has normal function. The left ventricle has no regional  wall motion abnormalities. There is mild left ventricular hypertrophy.  Left ventricular diastolic parameters  are indeterminate.   2. Right ventricular systolic function is normal. The right ventricular  size is normal. There is moderately elevated pulmonary artery systolic  pressure. The estimated right ventricular systolic pressure is 49.2 mmHg.   3. The mitral valve is degenerative. Mild to moderate mitral valve  regurgitation. No evidence of mitral stenosis.   4. Tricuspid valve regurgitation is mild to moderate.   5. The aortic valve is tricuspid. There is mild thickening of the aortic  valve. Aortic valve regurgitation is not visualized. Aortic valve  sclerosis is present, with no evidence of aortic valve stenosis.   6. The inferior vena cava is normal in size with greater than 50%  respiratory variability, suggesting right atrial pressure of 3 mmHg.    Epic records are reviewed at length today   CHA2DS2-VASc Score = 5  The patient's score is based upon: CHF History: 0 HTN History: 1 Diabetes History: 0 Stroke History: 0 Vascular Disease History: 1 Age Score: 2 Gender Score: 1      ASSESSMENT AND PLAN: 1. Persistent Atrial Fibrillation /  Atrial flutter (ICD10:  I48.0) The patient's CHA2DS2-VASc score is 5, indicating a 7.2% annual risk of stroke.   S/p Watchman implant 01/27/21  Patient is currently in Afib. We discussed with ERAF patient's rhythm control may benefit from increase. Due to history of Watchman procedure and ICH, will cautiously begin short term anticoagulation in preparation for cardioversion / increase in amiodarone . Plan will be to begin Eliquis  5 mg BID for 3 doses prior to cardioversion and continue for 30 days post cardioversion. Patient will increase amiodarone  to 200 mg daily on day of cardioversion (after 3 doses of Eliquis ). We discussed the procedure cardioversion to try to convert to NSR. We discussed the risks vs benefits of this procedure and how ultimately we cannot predict whether a patient will have early return of arrhythmia post procedure. After discussion, the patient wishes to proceed with cardioversion. Labs drawn today.   Informed Consent   Shared Decision Making/Informed Consent The risks (stroke, cardiac arrhythmias rarely resulting in the need for a temporary or permanent pacemaker, skin irritation or burns and complications associated with conscious sedation including aspiration, arrhythmia, respiratory failure and death), benefits (restoration of normal sinus rhythm) and alternatives of a direct current cardioversion were explained in detail to Sandra [name] and she agrees to proceed.      High risk medication monitoring (ICD10: [id number]) Patient requires ongoing monitoring for anti-arrhythmic medication which has the potential to cause life threatening arrhythmias or AV block. Qtc stable. Continue amiodarone  100 mg daily. She will increase to amiodarone  200 mg daily after 3 doses of Eliquis  5 mg.   2. Secondary Hypercoagulable State (ICD10:  D68.69) The patient is at significant risk for stroke/thromboembolism based upon her CHA2DS2-VASc Score of 5.  Continue Apixaban  (Eliquis ).  S/p Watchman  procedure on 01/27/21.  3. HTN Stable today.   4. CAD No anginal symptoms.  Follow up 2 weeks after DCCV.    Dorn Heinrich, [facility] [address] [place], [place] [address] [phone] 03/10/2024 3:48 PM

## 2024-03-10 NOTE — Progress Notes (Signed)
 Remote Loop Recorder Transmission

## 2024-03-10 NOTE — Telephone Encounter (Signed)
 Alert received from [employer] for Average V. Rate during AT/AF > Threshold AF in progress from 10/13 @ 01:03, not always good rate control, Watchman per EPIC.    DCCV was 08/2023.  Patient reports increased fatigue, lightheaded and palpitations over the past few days, although she is feeling better today although still mild fatigue. No recent sickness although increase in stress levels. Encouraged adequate hydration.  BP yesterday 91/82 HR: 92-138 bpm. Has not taken BP today.   Reports she took 2 additional metoprolol  25 mg over the weekend as directed by on call staff.  Recommend AF clinic referral. Patient agreeable.

## 2024-03-11 LAB — CBC
Hematocrit: 39.2 % (ref 34.0–46.6)
Hemoglobin: 12 g/dL (ref 11.1–15.9)
MCH: 28.4 pg (ref 26.6–33.0)
MCHC: 30.6 g/dL — ABNORMAL LOW (ref 31.5–35.7)
MCV: 93 fL (ref 79–97)
Platelets: 289 x10E3/uL (ref 150–450)
RBC: 4.23 x10E6/uL (ref 3.77–5.28)
RDW: 13.5 % (ref 11.7–15.4)
WBC: 6.2 x10E3/uL (ref 3.4–10.8)

## 2024-03-11 LAB — BASIC METABOLIC PANEL WITH GFR
BUN/Creatinine Ratio: 21 (ref 12–28)
BUN: 19 mg/dL (ref 8–27)
CO2: 22 mmol/L (ref 20–29)
Calcium: 8.8 mg/dL (ref 8.7–10.3)
Chloride: 105 mmol/L (ref 96–106)
Creatinine, Ser: 0.91 mg/dL (ref 0.57–1.00)
Glucose: 92 mg/dL (ref 70–99)
Potassium: 4.4 mmol/L (ref 3.5–5.2)
Sodium: 139 mmol/L (ref 134–144)
eGFR: 65 mL/min/1.73 (ref >59)

## 2024-03-11 NOTE — Anesthesia Preprocedure Evaluation (Signed)
 Anesthesia Evaluation  Patient identified by MRN, date of birth, ID band Patient awake    Reviewed: Allergy & Precautions, NPO status , Patient's Chart, lab work & pertinent test results, reviewed documented beta blocker date and time   History of Anesthesia Complications Negative for: history of anesthetic complications  Airway Mallampati: II  TM Distance: >3 FB Neck ROM: Full    Dental  (+) Dental Advisory Given   Pulmonary neg pulmonary ROS   breath sounds clear to auscultation       Cardiovascular hypertension, Pt. on medications and Pt. on home beta blockers (-) angina + CAD (non-obstructive) and +CHF  + dysrhythmias Atrial Fibrillation + pacemaker (loop recorder)  Rhythm:Regular Rate:Tachycardia  08/2023 ECHO :EF 65-70%, normal LVF, mild LVH, normal RVF, mild-mod MR, mid-mod TR   Neuro/Psych  Headaches  Anxiety Depression    H/o intracerebral bleed, managed conservatively    GI/Hepatic Neg liver ROS,GERD  Medicated and Controlled,,  Endo/Other  Hypothyroidism    Renal/GU Renal InsufficiencyRenal disease     Musculoskeletal   Abdominal   Peds  Hematology Eliquis    Anesthesia Other Findings   Reproductive/Obstetrics                              Anesthesia Physical Anesthesia Plan  ASA: 3  Anesthesia Plan: General   Post-op Pain Management: Minimal or no pain anticipated   Induction: Intravenous  PONV Risk Score and Plan: 3 and Treatment may vary due to age or medical condition  Airway Management Planned: Natural Airway and Nasal Cannula  Additional Equipment: None  Intra-op Plan:   Post-operative Plan:   Informed Consent: I have reviewed the patients History and Physical, chart, labs and discussed the procedure including the risks, benefits and alternatives for the proposed anesthesia with the patient or authorized representative who has indicated his/her understanding and  acceptance.     Dental advisory given  Plan Discussed with: CRNA and Surgeon  Anesthesia Plan Comments:          Anesthesia Quick Evaluation

## 2024-03-11 NOTE — Progress Notes (Signed)
 Pt called for pre procedure instructions. Arrival time 0730 NPO after midnight explained Instructed to take am meds with sip of water and confirmed blood thinner consistency. Instructed pt need for ride home tomorrow and have responsible adult with them for 24 hrs post procedure.

## 2024-03-12 ENCOUNTER — Ambulatory Visit (HOSPITAL_COMMUNITY): Payer: Self-pay | Admitting: Anesthesiology

## 2024-03-12 ENCOUNTER — Ambulatory Visit (HOSPITAL_COMMUNITY): Payer: Self-pay | Admitting: Internal Medicine

## 2024-03-12 ENCOUNTER — Ambulatory Visit (HOSPITAL_COMMUNITY)
Admission: RE | Admit: 2024-03-12 | Discharge: 2024-03-12 | Disposition: A | Discharge status: Home / Self Care | Payer: Medicare (Managed Care) | Attending: Internal Medicine | Admitting: Internal Medicine

## 2024-03-12 ENCOUNTER — Other Ambulatory Visit: Payer: Self-pay

## 2024-03-12 ENCOUNTER — Encounter (HOSPITAL_COMMUNITY)
Admission: RE | Disposition: A | Discharge status: Home / Self Care | Payer: Self-pay | Source: Home / Self Care | Attending: Internal Medicine

## 2024-03-12 DIAGNOSIS — I509 Heart failure, unspecified: Secondary | ICD-10-CM | POA: Diagnosis not present

## 2024-03-12 DIAGNOSIS — D6869 Other thrombophilia: Secondary | ICD-10-CM | POA: Insufficient documentation

## 2024-03-12 DIAGNOSIS — I4892 Unspecified atrial flutter: Secondary | ICD-10-CM | POA: Diagnosis not present

## 2024-03-12 DIAGNOSIS — I4891 Unspecified atrial fibrillation: Secondary | ICD-10-CM

## 2024-03-12 DIAGNOSIS — I110 Hypertensive heart disease with heart failure: Secondary | ICD-10-CM | POA: Insufficient documentation

## 2024-03-12 DIAGNOSIS — Z95818 Presence of other cardiac implants and grafts: Secondary | ICD-10-CM | POA: Insufficient documentation

## 2024-03-12 DIAGNOSIS — Z8249 Family history of ischemic heart disease and other diseases of the circulatory system: Secondary | ICD-10-CM | POA: Diagnosis not present

## 2024-03-12 DIAGNOSIS — Z7901 Long term (current) use of anticoagulants: Secondary | ICD-10-CM | POA: Diagnosis not present

## 2024-03-12 DIAGNOSIS — I4819 Other persistent atrial fibrillation: Secondary | ICD-10-CM | POA: Diagnosis present

## 2024-03-12 DIAGNOSIS — I5032 Chronic diastolic (congestive) heart failure: Secondary | ICD-10-CM | POA: Insufficient documentation

## 2024-03-12 DIAGNOSIS — I5033 Acute on chronic diastolic (congestive) heart failure: Secondary | ICD-10-CM | POA: Diagnosis not present

## 2024-03-12 DIAGNOSIS — I2510 Atherosclerotic heart disease of native coronary artery without angina pectoris: Secondary | ICD-10-CM | POA: Insufficient documentation

## 2024-03-12 DIAGNOSIS — Z79899 Other long term (current) drug therapy: Secondary | ICD-10-CM | POA: Insufficient documentation

## 2024-03-12 DIAGNOSIS — Z006 Encounter for examination for normal comparison and control in clinical research program: Secondary | ICD-10-CM

## 2024-03-12 HISTORY — PX: CARDIOVERSION: EP1203

## 2024-03-12 SURGERY — CARDIOVERSION (CATH LAB)
Anesthesia: General

## 2024-03-12 MED ORDER — PROPOFOL 10 MG/ML IV BOLUS
INTRAVENOUS | Status: DC | PRN
Start: 2024-03-12 — End: 2024-03-12
  Administered 2024-03-12: 60 mg via INTRAVENOUS

## 2024-03-12 MED ORDER — SODIUM CHLORIDE 0.9 % IV SOLN: INTRAVENOUS | Status: DC

## 2024-03-12 SURGICAL SUPPLY — 1 items: PAD DEFIB RADIO PHYSIO CONN (PAD) ×1 IMPLANT

## 2024-03-12 NOTE — Transfer of Care (Signed)
 Immediate Anesthesia Transfer of Care Note  Patient: Sandra [name]  Procedure(s) Performed: CARDIOVERSION  Patient Location: PACU and Cath Lab  Anesthesia Type:General  Level of Consciousness: drowsy  Airway & Oxygen Therapy: Patient Spontanous Breathing and Patient connected to nasal cannula oxygen  Post-op Assessment: Report given to RN and Post -op Vital signs reviewed and stable  Post vital signs: Reviewed and stable111/59  Last Vitals:  Vitals Value Taken Time  BP 111/59 0907  Temp    Pulse 59   Resp 24   SpO2 98     Last Pain:  Vitals:   03/12/24 0745  TempSrc:   PainSc: 0-No pain         Complications: There were no known notable events for this encounter.

## 2024-03-12 NOTE — CV Procedure (Signed)
    CARDIOVERSION NOTE  Procedure: Electrical Cardioversion Indications:  Atrial Fibrillation  Procedure Details:  Consent: Risks of procedure as well as the alternatives and risks of each were explained to the (patient/caregiver).  Consent for procedure obtained.  Time Out: Verified patient identification, verified procedure, site/side was marked, verified correct patient position, special equipment/implants available, medications/allergies/relevent history reviewed, required imaging and test results available.  Performed  Patient placed on cardiac monitor, pulse oximetry, supplemental oxygen as necessary.  Sedation given: propofol  per anesthesia Pacer pads placed anterior and posterior chest.  Cardioverted 1 time(s).  Cardioverted at 300J biphasic.  Impression: Findings: Post procedure EKG shows: NSR Complications: None Patient did tolerate procedure well.  Plan: Successful DCCV with a single 300J biphasic shock to NSR.  Time Spent Directly with the Patient:  30 minutes   Vinie KYM Maxcy, MD, [facility], FNLA, FACP  [place]  [facility]  Medical Director of the Advanced Lipid Disorders &  Cardiovascular Risk Reduction Clinic Diplomate of the American Board of Clinical Lipidology Attending Cardiologist  Direct Dial: [phone]  Fax: [phone]  Website:  [email or web address] Vinie BROCKS Adrijana Haros 03/12/2024, 9:04 AM

## 2024-03-12 NOTE — Discharge Instructions (Signed)

## 2024-03-12 NOTE — Research (Signed)
 [facility] Cardioversion Informed Consent   Subject Name: Sandra [name]  Subject met inclusion and exclusion criteria.  The informed consent form, study requirements and expectations were reviewed with the subject and questions and concerns were addressed prior to the signing of the consent form.  The subject verbalized understanding of the trial requirements.  The subject agreed to participate in the [facility] Cardioversion trial and signed the informed consent at 0745 on 15/Oct/2025.  The informed consent was obtained prior to performance of any protocol-specific procedures for the subject.  A copy of the signed informed consent was given to the subject and a copy was placed in the subject's medical record.   [name]

## 2024-03-12 NOTE — Interval H&P Note (Signed)
 History and Physical Interval Note:  03/12/2024 8:49 AM  Sandra [name] Level  has presented today for surgery, with the diagnosis of afib.  The various methods of treatment have been discussed with the patient and family. After consideration of risks, benefits and other options for treatment, the patient has consented to  Procedure(s): CARDIOVERSION (N/A) as a surgical intervention.  The patient's history has been reviewed, patient examined, no change in status, stable for surgery.  I have reviewed the patient's chart and labs.  Questions were answered to the patient's satisfaction.     Vinie JAYSON Maxcy

## 2024-03-12 NOTE — Anesthesia Postprocedure Evaluation (Signed)
 Anesthesia Post Note  Patient: Sandra [name]  Procedure(s) Performed: CARDIOVERSION     Patient location during evaluation: Cath Lab Anesthesia Type: General Level of consciousness: awake and alert, oriented and patient cooperative Pain management: pain level controlled Vital Signs Assessment: post-procedure vital signs reviewed and stable Respiratory status: spontaneous breathing, nonlabored ventilation and respiratory function stable Cardiovascular status: blood pressure returned to baseline and stable Postop Assessment: no apparent nausea or vomiting and able to ambulate Anesthetic complications: no   There were no known notable events for this encounter.  Last Vitals:  Vitals:   03/12/24 0927 03/12/24 0937  BP: 102/60 109/63  Pulse:  (!) 59  Resp: 13 10  Temp:    SpO2: 96% 95%    Last Pain:  Vitals:   03/12/24 0917  TempSrc:   PainSc: 0-No pain                 Elvena Oyer,E. Stephanieann Popescu

## 2024-03-13 ENCOUNTER — Encounter (HOSPITAL_COMMUNITY): Payer: Self-pay | Admitting: Internal Medicine

## 2024-03-17 ENCOUNTER — Telehealth: Payer: Self-pay

## 2024-03-17 NOTE — Telephone Encounter (Signed)
 Called patient back and got her scheduled for device reprogramming tomorrow at the [facility] office  Patient appreciative of the call back and assistance with making the appointment

## 2024-03-17 NOTE — Telephone Encounter (Signed)
 Patient had a DCCV which caused an electrical reset on her ILR. (Linq2)  Spoke with [employer] ([employer] rep).   Patient will need to come in so that we can connect with her loop recorder and just need to enter back in REASON FOR MONITORING.  In order for it to re-start giving us  diagnostic info instead of ???.

## 2024-03-18 ENCOUNTER — Ambulatory Visit: Payer: Medicare (Managed Care) | Attending: Cardiology

## 2024-03-18 ENCOUNTER — Ambulatory Visit: Payer: Medicare (Managed Care)

## 2024-03-18 DIAGNOSIS — R55 Syncope and collapse: Secondary | ICD-10-CM

## 2024-03-18 NOTE — Progress Notes (Signed)
 Patient brought in today due to recent electrical reset on her Linq2 ILR device that occurred with recent cardioversion.   Connected with ILR updated and re-entered reason for device programming which re-installed programming and monitoring parameters.  Also confirmed with [facility] network and [employer] tech services that ILR is back on line and will communicate diagnostics moving forward.  Nothing further to do.  Continue remote monitoring.

## 2024-03-19 NOTE — Addendum Note (Signed)
 Addended by: GERSHON PALMA C on: 03/19/2024 11:35 AM   Modules accepted: Orders

## 2024-03-21 ENCOUNTER — Other Ambulatory Visit (HOSPITAL_COMMUNITY): Payer: Self-pay

## 2024-03-21 MED ORDER — APIXABAN 5 MG PO TABS: 5.0000 mg | ORAL_TABLET | Freq: Two times a day (BID) | ORAL | Status: DC

## 2024-03-25 ENCOUNTER — Ambulatory Visit (HOSPITAL_COMMUNITY)
Admission: RE | Admit: 2024-03-25 | Discharge: 2024-03-25 | Disposition: A | Discharge status: Home / Self Care | Payer: Medicare (Managed Care) | Source: Ambulatory Visit | Attending: Internal Medicine | Admitting: Internal Medicine

## 2024-03-25 ENCOUNTER — Encounter (HOSPITAL_COMMUNITY): Payer: Self-pay | Admitting: Internal Medicine

## 2024-03-25 VITALS — BP 146/78 | HR 62 | Ht 63.0 in | Wt 117.6 lb

## 2024-03-25 DIAGNOSIS — D6869 Other thrombophilia: Secondary | ICD-10-CM

## 2024-03-25 DIAGNOSIS — I480 Paroxysmal atrial fibrillation: Secondary | ICD-10-CM | POA: Diagnosis not present

## 2024-03-25 DIAGNOSIS — Z5181 Encounter for therapeutic drug level monitoring: Secondary | ICD-10-CM

## 2024-03-25 DIAGNOSIS — Z79899 Other long term (current) drug therapy: Secondary | ICD-10-CM | POA: Diagnosis not present

## 2024-03-25 DIAGNOSIS — I4819 Other persistent atrial fibrillation: Secondary | ICD-10-CM

## 2024-03-25 MED ORDER — AMIODARONE HCL 200 MG PO TABS: 200.0000 mg | ORAL_TABLET | Freq: Every day | ORAL | 3 refills | Status: DC

## 2024-03-25 MED ORDER — AMIODARONE HCL 200 MG PO TABS: 200.0000 mg | ORAL_TABLET | Freq: Every day | ORAL | 6 refills | Status: DC

## 2024-03-25 NOTE — Patient Instructions (Addendum)
 Stop Eliquis  on after 04/11/24 then start Aspirin  81 mg once daily

## 2024-03-25 NOTE — Progress Notes (Signed)
 Primary Care Physician: Sampson Ethridge LABOR, MD Primary Cardiologist: Dr End  Primary Electrophysiologist: Dr Cindie Referring Physician: Dr Cindie Niels LITTIE Sandra [name] is a 78 y.o. female with a history of intracranial hemorrhage, HTN, CAD, and atrial fibrillation who presents for follow up in the [facility]. Patient has a CHADS2VASC score of 5. Patient was seen by Dr Cindie for [facility] device consideration. She is not felt to be a candidate for long term anticoagulation due to intracranial hemorrhage while on Eliquis  06/2020. Eliquis  was reversed at that time. She was taken off anticoagulation but remained on ASA. She is s/p Watchman implant on 01/27/21. Patient reported on her follow up 03/03/21 that she was having more frequent episodes of afib with symptoms of SOB, dizziness, and fatigue. Her flecainide  was increased but her symptoms worsened. Flecainide  was discontinued on 03/11/21 and she has not had any symptoms since.   On follow up 03/10/24, patient is currently in Afib. Patient contacted office on 10/11 noting palpitations and advised to take metoprolol  50 mg x 1 dose. Device clinic alert on 10/13 noting Afib in progress from 10/13 at 0103. She takes amiodarone  100 mg daily and Lopressor  PRN. Review of ILR shows patient having frequent paroxysmal Afib episodes since 10/11; no previous Afib since April. She is not on anticoagulation due to Watchman procedure and history of intracranial hemorrhage.   On follow up 03/25/24, patient is currently in NSR. S/p successful DCCV on 03/12/24. She is taking amiodarone  200 mg daily. She has not missed any doses of Eliquis  5 mg BID.   Today, she denies symptoms of palpitations, chest pain, shortness of breath, orthopnea, PND, lower extremity edema, dizziness, presyncope, syncope, snoring, daytime somnolence, bleeding, or neurologic sequela. The patient is tolerating medications without difficulties and is otherwise without  complaint today.    Atrial Fibrillation Risk Factors:  she does not have symptoms or diagnosis of sleep apnea. she does not have a history of rheumatic fever.   she has a BMI of Body mass index is 20.83 kg/m.[name] Filed Weights   03/25/24 1447  Weight: 53.3 kg      Family History  Problem Relation Age of Onset   Cancer Mother    Anemia Mother    Other Mother        lymphosarcoma   Alcohol  abuse Father    Prostate cancer Father    Breast cancer Sister        35; 1/2 sister, shared mother.   Cancer Sister        bile duct   Stroke Maternal Grandmother    Breast cancer Maternal Grandmother 68   Heart attack Maternal Grandfather    CAD Paternal Grandfather      Atrial Fibrillation Management history:  Previous antiarrhythmic drugs: flecainide , amiodarone  Previous cardioversions: 09/06/23, 03/12/24 Previous ablations: none CHADS2VASC score: 5 Anticoagulation history: Eliquis    Past Medical History:  Diagnosis Date   (HFpEF) heart failure with preserved ejection fraction (HCC)    a. 08/2019 Echo: EF 60-65%, no rwma, mild LVH, Gr2 DD, nl RV fxn. Nl pASP. Mildly dil LA. Mild to mod MR.   Anemia    Anxiety    Arthritis    BRCA gene mutation negative 10/2016   NEGATIVE: [employer]   Breast cancer (HCC) 10/16/2016   T1c,N0;, GRADE I/III, 1.6cm. ER/PR pos  HER2 not over expressed, Right Upper Outer   Celiac disease    Celiac syndrome    Cerebellar hemorrhage, acute (HCC)  07/17/2020   CHF (congestive heart failure) (HCC)    Depression    Dyspnea    WITH EXERTION   Fatty liver    GERD (gastroesophageal reflux disease)    OCC   Glaucoma    right eye   Headache    H/O MIGRAINES AS TEENAGER.   Heart murmur    a. 08/2019 Echo: mild to mod MR.   Hypertension    Hypothyroidism    Non-obstructive CAD (coronary artery disease)    a. 01/2017 MV: Hypertensive response. No ischemia/infarct. EF >65%; b. 09/2019 Cor CTA: Ca2+ = 9.29 (36th percentile). LAD calcified plaque  (0-24%), otw nl. Multipel bilat pulm nodules up to 7mm.   Osteoporosis    PAF (paroxysmal atrial fibrillation) (HCC)    a. 02/2020 Zio: predominantly RSR, 65 (50-105), rare PACs/PVCs, 8 beats NSVT, multiple episodes of PAF lasting up to 1hr . Avg AF rate 130 (93-170). AF burden <1%. Triggered events = RSR, PACs, and PAF; b. CHA2DS2VASc = 6.   Pneumonia    YEARS AGO   Pre-diabetes    Presence of Watchman left atrial appendage closure device 01/2021   Pulmonary nodules    a. 09/2019 Cor CTA: incidental finding of multiple bilat pulm nodules; b. 12/2019 High Res CT: stable, scattered solid pulm nodules.   Raynaud's disease    RLS (restless legs syndrome)    Stroke (HCC) 06/2020   mild balance issues   Wears hearing aid in both ears    Past Surgical History:  Procedure Laterality Date   ABDOMINAL HYSTERECTOMY     APPENDECTOMY     AXILLARY LYMPH NODE BIOPSY Right 03/05/2017   Procedure: AXILLARY LYMPH NODE BIOPSY;  Surgeon: Dessa Reyes ORN, MD;  Location: [facility] ORS;  Service: General;  Laterality: Right;   CARDIOVERSION N/A 09/06/2023   Procedure: CARDIOVERSION;  Surgeon: Perla Evalene PARAS, MD;  Location: [facility];  Service: Cardiovascular;  Laterality: N/A;   CARDIOVERSION N/A 03/12/2024   Procedure: CARDIOVERSION;  Surgeon: Mona Vinie BROCKS, MD;  Location: [facility];  Service: Cardiovascular;  Laterality: N/A;   CATARACT EXTRACTION Bilateral    CESAREAN SECTION     COLONOSCOPY WITH PROPOFOL  N/A 05/19/2015   Procedure: COLONOSCOPY WITH PROPOFOL ;  Surgeon: Lamar ONEIDA Holmes, MD;  Location: [facility] ENDOSCOPY;  Service: Endoscopy;  Laterality: N/A;   COLONOSCOPY WITH PROPOFOL  N/A 08/01/2019   Procedure: COLONOSCOPY WITH PROPOFOL ;  Surgeon: Jinny Carmine, MD;  Location: [facility] ENDOSCOPY;  Service: Endoscopy;  Laterality: N/A;   EYE SURGERY     HAND SURGERY Left    HIP ARTHROPLASTY Left 07/21/2023   Procedure: ARTHROPLASTY BIPOLAR HIP (HEMIARTHROPLASTY);  Surgeon: Cleotilde Barrio, MD;   Location: [facility] OR[facility];  Service: Orthopedics;  Laterality: Left;   HIP ARTHROPLASTY Right 11/27/2023   Procedure: HEMIARTHROPLASTY (BIPOLAR) HIP, POSTERIOR APPROACH FOR FRACTURE;  Surgeon: Tobie Priest, MD;  Location: [facility] ORS;  Service: Orthopedics;  Laterality: Right;   LEFT ATRIAL APPENDAGE OCCLUSION N/A 01/27/2021   Procedure: LEFT ATRIAL APPENDAGE OCCLUSION;  Surgeon: Cindie Ole ONEIDA, MD;  Location: [facility];  Service: Cardiovascular;  Laterality: N/A;   MASTECTOMY Right 11/07/2016   RESIDUAL INVASIVE MAMMARY CARCINOMA, SUBAREOLAR ANTERIOR TO PREVIOUS    OPEN REDUCTION INTERNAL FIXATION (ORIF) DISTAL RADIAL FRACTURE Right 04/11/2022   Procedure: OPEN REDUCTION INTERNAL FIXATION (ORIF) DISTAL RADIUS FRACTURE;  Surgeon: Kathlynn Sharper, MD;  Location: [facility];  Service: Orthopedics;  Laterality: Right;   OPEN REDUCTION INTERNAL FIXATION (ORIF) DISTAL RADIAL FRACTURE Left 08/04/2022  Procedure: OPEN REDUCTION INTERNAL FIXATION (ORIF) DISTAL RADIUS FRACTURE;  Surgeon: Kathlynn Sharper, MD;  Location: [facility];  Service: Orthopedics;  Laterality: Left;   SENTINEL NODE BIOPSY Right 11/07/2016   Procedure: SENTINEL NODE BIOPSY;  Surgeon: Dessa Reyes ORN, MD;  Location: [facility] ORS;  Service: General;  Laterality: Right;   SIMPLE MASTECTOMY WITH AXILLARY SENTINEL NODE BIOPSY Right 11/07/2016   6 mm ER/PR 100%; Her 2 neu not overexpressed, T1b, N0.  Surgeon: Dessa Reyes ORN, MD;  Location: [facility];  Service: General;  Laterality: Right;   TEE WITHOUT CARDIOVERSION N/A 01/27/2021   Procedure: TRANSESOPHAGEAL ECHOCARDIOGRAM (TEE);  Surgeon: Cindie Ole DASEN, MD;  Location: [facility] INVASIVE CV LAB;  Service: Cardiovascular;  Laterality: N/A;   TEE WITHOUT CARDIOVERSION N/A 03/10/2021   Procedure: TRANSESOPHAGEAL ECHOCARDIOGRAM (TEE);  Surgeon: Loni Soyla LABOR, MD;  Location: [facility] ENDOSCOPY;  Service: Cardiology;  Laterality: N/A;   TONSILLECTOMY  AGE 74   UPPER GI ENDOSCOPY  01/12/06    hiatus hernia    Current Outpatient Medications  Medication Sig Dispense Refill   acetaminophen  (TYLENOL ) 325 MG tablet Take 325 mg by mouth every 6 (six) hours as needed for headache or fever.     ALPRAZolam  (XANAX ) 0.25 MG tablet Take 1 tablet (0.25 mg total) by mouth 2 (two) times daily as needed for anxiety. (Patient taking differently: Take 0.25 mg by mouth at bedtime.) 10 tablet 0   amiodarone  (PACERONE ) 200 MG tablet Take 1 tablet (200 mg total) by mouth daily. 30 tablet 0   apixaban  (ELIQUIS ) 5 MG TABS tablet Take 1 tablet (5 mg total) by mouth 2 (two) times daily. 14 tablet    atorvastatin  (LIPITOR) 40 MG tablet Take 1 tablet (40 mg total) by mouth daily. 90 tablet 3   b complex vitamins capsule Take 1 capsule by mouth daily.     brimonidine  (ALPHAGAN ) 0.2 % ophthalmic solution Place 1 drop into both eyes 2 (two) times daily.     calcium  carbonate (CALCIUM  600) 600 MG TABS tablet Take 600 mg by mouth daily with breakfast.  for bone health     Cholecalciferol  (VITAMIN D3) 125 MCG (5000 UT) TABS Take 5,000 Units by mouth daily.     docusate sodium  (COLACE) 100 MG capsule Take 100 mg by mouth daily.     ferrous sulfate 325 (65 FE) MG tablet Take 325 mg by mouth daily with breakfast.     latanoprost  (XALATAN ) 0.005 % ophthalmic solution Place 1 drop into both eyes at bedtime.  5   levothyroxine  (SYNTHROID ) 100 MCG tablet TAKE 1 TABLET BY MOUTH 30  MINUTES BEFORE BREAKFAST 90 tablet 3   losartan  (COZAAR ) 50 MG tablet Take 1.5 tablets (75 mg total) by mouth daily. (Patient taking differently: Take 50 mg by mouth daily.) 135 tablet 3   metoprolol  tartrate (LOPRESSOR ) 25 MG tablet Take 0.5 tablets (12.5 mg total) by mouth as needed (Afib episodes with HR > 120bpm.). 30 tablet 3   Multiple Vitamins-Minerals (CENTRUM SILVER 50+WOMEN) TABS Take 1 tablet by mouth daily.     Multiple Vitamins-Minerals (PRESERVISION AREDS 2) CAPS Take 1 capsule by mouth 2 (two) times daily.     omeprazole   (PRILOSEC) 40 MG capsule TAKE 1 CAPSULE BY MOUTH DAILY 90 capsule 3   potassium chloride  (KLOR-CON ) 20 MEQ packet DISSOLVE 2 PACKETS IN 4 OZ WATER OR JUICE AND DRINK DAILY 200 each 3   Probiotic Product (PROBIOTIC PO) Take 1 tablet by mouth daily. 3 billion live cultures  sertraline  (ZOLOFT ) 100 MG tablet Take 100 mg by mouth daily.     No current facility-administered medications for this encounter.    No Known Allergies  ROS- All systems are reviewed and negative except as per the HPI above.  Physical Exam: Vitals:   03/25/24 1447  BP: (!) 146/78  Pulse: 62  Weight: 53.3 kg  Height: 5' 3 (1.6 m)    GEN- The patient is well appearing, alert and oriented x 3 today.   Neck - no JVD or carotid bruit noted Lungs- Clear to ausculation bilaterally, normal work of breathing Heart- Regular rate and rhythm, no murmurs, rubs or gallops, PMI not laterally displaced Extremities- no clubbing, cyanosis, or edema Skin - no rash or ecchymosis noted   Wt Readings from Last 3 Encounters:  03/25/24 53.3 kg  03/10/24 53.9 kg  11/27/23 56.7 kg    EKG today demonstrates  Vent. rate 62 BPM PR interval 182 ms QRS duration 88 ms QT/QTcB 434/440 ms P-R-T axes 77 57 235 Normal sinus rhythm Left ventricular hypertrophy with repolarization abnormality ( Sokolow-Lyon ) Cannot rule out Septal infarct , age undetermined Abnormal ECG When compared with ECG of 12-Mar-2024 09:08, Previous ECG is present  Echo 09/09/23: 1. Left ventricular ejection fraction, by estimation, is 65 to 70%. The  left ventricle has normal function. The left ventricle has no regional  wall motion abnormalities. There is mild left ventricular hypertrophy.  Left ventricular diastolic parameters  are indeterminate.   2. Right ventricular systolic function is normal. The right ventricular  size is normal. There is moderately elevated pulmonary artery systolic  pressure. The estimated right ventricular systolic pressure  is 49.2 mmHg.   3. The mitral valve is degenerative. Mild to moderate mitral valve  regurgitation. No evidence of mitral stenosis.   4. Tricuspid valve regurgitation is mild to moderate.   5. The aortic valve is tricuspid. There is mild thickening of the aortic  valve. Aortic valve regurgitation is not visualized. Aortic valve  sclerosis is present, with no evidence of aortic valve stenosis.   6. The inferior vena cava is normal in size with greater than 50%  respiratory variability, suggesting right atrial pressure of 3 mmHg.    Epic records are reviewed at length today   CHA2DS2-VASc Score = 5  The patient's score is based upon: CHF History: 0 HTN History: 1 Diabetes History: 0 Stroke History: 0 Vascular Disease History: 1 Age Score: 2 Gender Score: 1      ASSESSMENT AND PLAN: 1. Persistent Atrial Fibrillation / Atrial flutter (ICD10:  I48.0) The patient's CHA2DS2-VASc score is 5, indicating a 7.2% annual risk of stroke.   S/p Watchman implant 01/27/21. S/p DCCV on 03/12/24.  Patient is currently in NSR. Continue Eliquis  for 30 days from date of cardioversion and then may discontinue due to history of Watchman procedure. She will transition at that time from Eliquis  back to daily ASA. Patient notes ongoing stressful situation with taking care of her husband (end stages of COPD per patient). I noted to patient she may need to stay on amiodarone  200 mg daily since she had breakthrough Afib on 100 mg daily. I recommended she discuss this with Dr. Cindie but to continue 200 mg daily for now.  High risk medication monitoring (ICD10: [id number]) Patient requires ongoing monitoring for anti-arrhythmic medication which has the potential to cause life threatening arrhythmias or AV block. Qtc stable. Continue amiodarone  200 mg daily.  2. Secondary Hypercoagulable State (ICD10:  D68.69) The  patient is at significant risk for stroke/thromboembolism based upon her CHA2DS2-VASc Score of 5.   Continue Apixaban  (Eliquis ).  S/p Watchman procedure on 01/27/21.  3. HTN Stable today.  4. CAD No anginal symptoms.   Follow up as scheduled with Dr. Cindie.   Dorn Heinrich, [facility] [address] [place], [place] [address] [phone] 03/25/2024 2:52 PM

## 2024-03-27 ENCOUNTER — Telehealth: Payer: Self-pay | Admitting: Cardiology

## 2024-03-27 NOTE — Telephone Encounter (Signed)
 Received outpatient call from patient regarding elevated BP and headaches for the past several days. Had cardioversion 10/15 and now back on eliquis  for one month. Previous watchman with hx of ICH. Reports she has been waking up with headaches the past several days, seem to improve through the day. This morning took her BP and noted 189/94. Also under increased stress with a sick spouse. No neuro deficits. I advised to increase her losartan  up to 75mg  daily (had been on 50mg  daily) and ok to use tylenol . If no improvement in symptoms, then would strongly consider evaluation in the ED with imaging to ensure bleeding as she has been back on Eliquis . She voiced understanding and thanked me for callback.

## 2024-03-28 ENCOUNTER — Telehealth: Payer: Self-pay | Admitting: Cardiology

## 2024-03-28 NOTE — Telephone Encounter (Signed)
 Answered the STAT phone and spoke with the patient.  Patient states having elevated blood pressures in the mornings around 3 am and waking up with bad headaches.  Patient had not yet taken her blood pressure medications.  Patient does not complain of any other symptoms other than the headache.  Asked the patient about recent change in habits, diet, medications, etc.  Patient states increased dosage recently of Amiodarone  from 100 mg to 200 mg, increase losartan  from 50 mg to 75 mg and being put on Eliquis  after a Cardioversion procedure.  Patient was advised to take the blood pressure medications, wait for about an hour, and then recheck the blood pressures.  If they continue to be elevated in the 180s or above systolic, advised the patient to go to the emergency room for a thorough work up.  Patient expressed agreement with this plan of action.  Patient was also advised to call back in about an hour and report what her blood pressures were.  Patient was also advised to schedule a next available office visit with cardiologist.  Patient expressed gratitude for answering the call and said she would call back.  Patient advised that this would be forwarded to DOD and this RN will call back with any further recommendations.

## 2024-03-28 NOTE — Telephone Encounter (Signed)
 Called patient to advise of increase to losartan  to 75 mg daily - office visit scheduled with Bernardino Bring, PA 11/3

## 2024-03-28 NOTE — Telephone Encounter (Signed)
 Pt c/o BP issue: STAT if pt c/o blurred vision, one-sided weakness or slurred speech.   1. What is your BP concern?  BP is high.  2. Have you taken any BP medication today? Not yet.   3. What are your last 5 BP readings? 10/30: 189/94 65            189/84 60  10/31: 186/79 79            192/77 66             178/75 66  4. Are you having any other symptoms (ex. Dizziness, headache, blurred vision, passed out)?  Headaches

## 2024-03-28 NOTE — Telephone Encounter (Signed)
 Called and spoke with the patient to find out if her BP had normalized.  Patient reports blood pressure reading of 161/69 with HR of 64.  Patient advised to stay well hydrated, and take prescribed medications as directed.  Patient also informed of signs and symptoms that would warrant a call to 911 or a visit to the emergency room.  Patient verbalized understanding and expressed gratitude for the call. All questions and concerns addressed at this time.

## 2024-03-31 ENCOUNTER — Ambulatory Visit: Payer: Medicare (Managed Care) | Admitting: Physician Assistant

## 2024-04-01 ENCOUNTER — Telehealth: Payer: Self-pay | Admitting: Cardiology

## 2024-04-01 ENCOUNTER — Other Ambulatory Visit (HOSPITAL_COMMUNITY): Payer: Self-pay

## 2024-04-01 MED ORDER — LOSARTAN POTASSIUM 100 MG PO TABS
100.0000 mg | ORAL_TABLET | Freq: Every day | ORAL | 3 refills | Status: AC
  Filled 2024-04-01: qty 90, 90d supply, fill #0

## 2024-04-01 NOTE — Telephone Encounter (Signed)
 I recommend increasing losartan  to 100 mg daily and having her come in for a BMP in 1 week.  Lonni Hanson, MD [facility]

## 2024-04-01 NOTE — Telephone Encounter (Signed)
 Pt c/o BP issue: STAT if pt c/o blurred vision, one-sided weakness or slurred speech.  STAT if BP is GREATER than 180/120 TODAY.  STAT if BP is LESS than 90/60 and SYMPTOMATIC TODAY  1. What is your BP concern? Hypertension   2. Have you taken any BP medication today? No not yet    3. What are your last 5 BP readings? 183/72 first thing this morning   4. Are you having any other symptoms (ex. Dizziness, headache, blurred vision, passed out)? No    Pt called last week with headaches and high bp, was advised to increase losartan . Pt did increase and headaches have subsided, but bp still high this morning. Please advise.

## 2024-04-01 NOTE — Telephone Encounter (Signed)
 Called and spoke with pt to inform her that Dr. Mady recommended that she take Losartan  (COZAAR ) 100 mg by mouth once daily and that he would like for her to get a BMET lab drawn within one week of beginning the new dose.Pt verbalized understand and thanked me for the follow up phone call.  Pt also requested that these instructions be sent to her MyChart.

## 2024-04-01 NOTE — Telephone Encounter (Signed)
 Pt had called on 10/30 on STAT phone with high BP and severe headaches; DOD advised to increase Losartan  from 50 mg to 75 mg; She took 50 mg in the morning and 25 mg at bedtime; pt states that her headaches have subsided; however, her BP remains elevated;  today, prior to meds; BP 183/72 HR 66; after medications BP 166/76 HR 64; pt states that she is under a lot of stress due to spouse being placed on Hospice and is having all the emotions; encouraged pt to keep a log of her BP and HR; pt is seen by EP and Gen Cards; had DCCV on 10/15; will reach out to both Suzann Riddle, NP and Lonni Hanson, MD for any further recommendations; pt thanked me for calling her back.

## 2024-04-07 ENCOUNTER — Ambulatory Visit: Payer: Self-pay

## 2024-04-07 ENCOUNTER — Encounter: Payer: Self-pay | Admitting: Physician Assistant

## 2024-04-07 ENCOUNTER — Ambulatory Visit: Payer: Medicare (Managed Care) | Attending: Physician Assistant | Admitting: Physician Assistant

## 2024-04-07 VITALS — BP 140/60 | HR 57 | Ht 63.0 in | Wt 117.2 lb

## 2024-04-07 DIAGNOSIS — I5032 Chronic diastolic (congestive) heart failure: Secondary | ICD-10-CM

## 2024-04-07 DIAGNOSIS — I480 Paroxysmal atrial fibrillation: Secondary | ICD-10-CM | POA: Diagnosis not present

## 2024-04-07 DIAGNOSIS — I2510 Atherosclerotic heart disease of native coronary artery without angina pectoris: Secondary | ICD-10-CM | POA: Diagnosis not present

## 2024-04-07 DIAGNOSIS — I10 Essential (primary) hypertension: Secondary | ICD-10-CM | POA: Diagnosis not present

## 2024-04-07 MED ORDER — AMLODIPINE BESYLATE 2.5 MG PO TABS: 2.5000 mg | ORAL_TABLET | Freq: Every day | ORAL | 3 refills | Status: DC

## 2024-04-07 NOTE — Progress Notes (Signed)
 Cardiology Office Note    Date:  04/07/2024   ID:  Sandra [name], DOB June 27, 1945, MRN [id number]  PCP:  Sandra [name] LABOR, MD  Cardiologist:  Sandra Hanson, MD  Electrophysiologist:  Sandra ONEIDA HOLTS, MD   Chief Complaint: Elevated BP  History of Present Illness:   Sandra [name] is a 78 y.o. female with history of nonobstructive CAD, HFpEF, PAF complicated by intracranial hemorrhage in 06/2020 in the setting of recurrent mechanical fall now s/p Watchman implantation on 9/1/022, tachybradycardia syndrome, right-sided breast cancer status post mastectomy without chemoradiation, HTN, celiac disease, Raynaud's disease, hypothyroidism, RLS, GERD, and anxiety who presents for evaluation of elevated blood pressure.   She was previously evaluated by Dr. Florencio for preoperative evaluation in the setting of right mastectomy in the summer 2018.  She was found to have an abnormal EKG and cleared for surgery without additional testing.  She indicated she had abnormal EKGs dating back to 2012 at which time she was evaluated by Dr. Bosie and underwent stress test which was normal per her report.     She has subsequently established with Dr. Hanson in 12/2016.  Echo on 01/18/2017 showed an EF of 60 to 65%, mild concentric LVH with moderate hypertrophy of the septum with near cavity obliteration with systole with no significant outflow tract gradient measured, no regional wall motion abnormalities, grade 2 diastolic dysfunction, mild mitral regurgitation, mildly dilated left atrium, normal RV systolic function and RVSP.  Treadmill MPI in 01/2017 showed a hypertensive response to exercise with no significant ischemia or infarct.  LVEF was hyperdynamic at greater than 65%.  Echo in 08/2019 showed an EF of 60-65%, no RWMA, mild LVH, Gr2DD, normal RVSF and ventricular cavity size, normal PASP, mildly dilated left atrium, mild to moderate mitral regurgitation. Coronary CTA in 09/2019 showed a calcium  score of 9  which was 35th percentile with calcified plaque in the proximal LAD estimated at 0 to 24% with otherwise nonobstructive disease.  Outpatient cardiac monitor in 01/2020 showed a predominant rhythm of sinus with multiple episodes of PAF lasting up to 1 hour 25 minutes.  Overall A. fib burden was less than 1%.  She was initiated on apixaban .  In 06/2020, she had a prolonged episode of Afib associated with a severe headache.  EMS evaluated [name], but she refused transfer to ED.  She subsequently developed dizziness and nausea; she was advised to go to the ED for further evaluation.  There she was found to have an acute hemorrhagic infarct in the left cerebellum.  Echo during that admission showed an EF of 65 to 70%, no regional wall motion normalities, moderate LVH, grade 2 diastolic dysfunction, normal RV systolic function and ventricular cavity size, mild biatrial enlargement, mild mitral regurgitation, and trivial aortic insufficiency.  Anticoagulation was reversed with Kcentra .  Repeat CT showed stable hemorrhage.  She was taken off anticoagulation and continued on aspirin  81 mg.  She was subsequently evaluated by EP and underwent Watchman implantation on 01/27/2021.  Following this, she was under increased stress and noticed an increase in her palpitation burden.  TEE on 03/10/2021 demonstrated an EF of 60 to 65% with a well-seated left atrial appendage occluder device with no residual flow.  No left atrial or left atrial appendage thrombus was detected.  Echo in 03/2022 showed an EF of 60 to 65%, no regional wall motion abnormalities, mild LVH, normal RV systolic function and ventricular cavity size, moderately dilated left atrium, and mild mitral regurgitation.  Zio patch in 03/2022 showed a predominant rhythm of sinus with 20 episodes of SVT lasting up to 1 minute and 37 seconds, and a single episode of NSVT lasting 4 beats.  Patient triggered events corresponded to sinus rhythm.  Echo in 01/2023 showed an EF of  60 to 65%, no regional wall motion abnormalities, normal LV diastolic function parameters, normal RV systolic function, ventricular cavity size, and RVSP, mildly dilated left atrium, and moderate mitral regurgitation.  Echo in 08/2023 showed an EF of 65 to 70%, no regional wall motion, mild LVH, normal RV systolic function and ventricular cavity size, moderately elevated RVSP estimated at 49.2 mmHg, mild to moderate mitral regurgitation, mild to moderate tricuspid regurgitation, and aortic valve sclerosis without evidence of stenosis.  Cardiac MRI in 08/2023 showed normal LVEF with mild LVH and no evidence of LGE, scar, or infiltrative disease.  She has been followed closely by EP and the A-fib clinic, requiring medication adjustments for tachybradycardia rates and is status post loop recorder.  PPM has not been felt to be indicated.    She was evaluated by the A-fib clinic in 02/2024 and found to be back in A-fib.  Loop recorder interrogation showed frequent episodes of A-fib dating back to 10/11.  In this setting she was started on apixaban  and amiodarone  was increased back to 200 mg daily.  She subsequently underwent successful DCCV on 03/12/2024.  She followed up with the EP clinic on 03/17/2024 and was maintaining sinus rhythm with recommendation to continue apixaban  for 30 days from date of cardioversion with transition back to aspirin  thereafter.  It was also recommended she be continued on amiodarone  200 mg daily for the time being with follow-up to discuss this further with EP.   She contacted our office in late 02/2024 reporting elevated BP in the 170s to 190s mmHg with heart rates in the 60s to 70s bpm with associated headache with recommendation to increase losartan  initially to 75 mg followed by further titration to 100 mg daily and follow-up today.  She comes in today and is without symptoms of angina or cardiac decompensation.  Since undergoing the above titration of losartan  her headaches  have resolved.  In this setting she has not been routinely checking blood pressures.  She has been under increased stress lately surrounding the health of her husband who is now on hospice care.  No lower extremity swelling or progressive orthopnea.  No dizziness, presyncope, or syncope.  No falls or symptoms concerning for bleeding.  She does remain on apixaban  and will complete 30 days post DCCV on 04/11/2024.   Labs independently reviewed: 02/2024 - BUN 19, serum creatinine 0.91, potassium 4.4, Hgb 12.0, PLT 289 10/2023 - magnesium  2.0 08/2023 - TSH normal 05/2021 - TC 138, TG 120, HDL 52, LDL 63  Past Medical History:  Diagnosis Date   (HFpEF) heart failure with preserved ejection fraction (HCC)    a. 08/2019 Echo: EF 60-65%, no rwma, mild LVH, Gr2 DD, nl RV fxn. Nl pASP. Mildly dil LA. Mild to mod MR.   Anemia    Anxiety    Arthritis    BRCA gene mutation negative 10/2016   NEGATIVE: [employer]   Breast cancer (HCC) 10/16/2016   T1c,N0;, GRADE I/III, 1.6cm. ER/PR pos  HER2 not over expressed, Right Upper Outer   Celiac disease    Celiac syndrome    Cerebellar hemorrhage, acute (HCC) 07/17/2020   CHF (congestive heart failure) (HCC)    Depression    Dyspnea  WITH EXERTION   Fatty liver    GERD (gastroesophageal reflux disease)    OCC   Glaucoma    right eye   Headache    H/O MIGRAINES AS TEENAGER.   Heart murmur    a. 08/2019 Echo: mild to mod MR.   Hypertension    Hypothyroidism    Non-obstructive CAD (coronary artery disease)    a. 01/2017 MV: Hypertensive response. No ischemia/infarct. EF >65%; b. 09/2019 Cor CTA: Ca2+ = 9.29 (36th percentile). LAD calcified plaque (0-24%), otw nl. Multipel bilat pulm nodules up to 7mm.   Osteoporosis    PAF (paroxysmal atrial fibrillation) (HCC)    a. 02/2020 Zio: predominantly RSR, 65 (50-105), rare PACs/PVCs, 8 beats NSVT, multiple episodes of PAF lasting up to 1hr . Avg AF rate 130 (93-170). AF burden <1%. Triggered events = RSR,  PACs, and PAF; b. CHA2DS2VASc = 6.   Pneumonia    YEARS AGO   Pre-diabetes    Presence of Watchman left atrial appendage closure device 01/2021   Pulmonary nodules    a. 09/2019 Cor CTA: incidental finding of multiple bilat pulm nodules; b. 12/2019 High Res CT: stable, scattered solid pulm nodules.   Raynaud's disease    RLS (restless legs syndrome)    Stroke (HCC) 06/2020   mild balance issues   Wears hearing aid in both ears     Past Surgical History:  Procedure Laterality Date   ABDOMINAL HYSTERECTOMY     APPENDECTOMY     AXILLARY LYMPH NODE BIOPSY Right 03/05/2017   Procedure: AXILLARY LYMPH NODE BIOPSY;  Surgeon: Dessa Reyes ORN, MD;  Location: [facility];  Service: General;  Laterality: Right;   CARDIOVERSION N/A 09/06/2023   Procedure: CARDIOVERSION;  Surgeon: Perla Evalene PARAS, MD;  Location: [facility];  Service: Cardiovascular;  Laterality: N/A;   CARDIOVERSION N/A 03/12/2024   Procedure: CARDIOVERSION;  Surgeon: Mona Vinie BROCKS, MD;  Location: [facility];  Service: Cardiovascular;  Laterality: N/A;   CATARACT EXTRACTION Bilateral    CESAREAN SECTION     COLONOSCOPY WITH PROPOFOL  N/A 05/19/2015   Procedure: COLONOSCOPY WITH PROPOFOL ;  Surgeon: Lamar ONEIDA Holmes, MD;  Location: [facility] END[facility];  Service: Endoscopy;  Laterality: N/A;   COLONOSCOPY WITH PROPOFOL  N/A 08/01/2019   Procedure: COLONOSCOPY WITH PROPOFOL ;  Surgeon: Jinny Carmine, MD;  Location: [facility] ENDOSCOPY;  Service: Endoscopy;  Laterality: N/A;   EYE SURGERY     HAND SURGERY Left    HIP ARTHROPLASTY Left 07/21/2023   Procedure: ARTHROPLASTY BIPOLAR HIP (HEMIARTHROPLASTY);  Surgeon: Cleotilde Barrio, MD;  Location: [facility];  Service: Orthopedics;  Laterality: Left;   HIP ARTHROPLASTY Right 11/27/2023   Procedure: HEMIARTHROPLASTY (BIPOLAR) HIP, POSTERIOR APPROACH FOR FRACTURE;  Surgeon: Tobie Priest, MD;  Location: [facility];  Service: Orthopedics;  Laterality: Right;   LEFT ATRIAL APPENDAGE OCCLUSION N/A 01/27/2021    Procedure: LEFT ATRIAL APPENDAGE OCCLUSION;  Surgeon: Cindie Sandra ONEIDA, MD;  Location: [facility];  Service: Cardiovascular;  Laterality: N/A;   MASTECTOMY Right 11/07/2016   RESIDUAL INVASIVE MAMMARY CARCINOMA, SUBAREOLAR ANTERIOR TO PREVIOUS    OPEN REDUCTION INTERNAL FIXATION (ORIF) DISTAL RADIAL FRACTURE Right 04/11/2022   Procedure: OPEN REDUCTION INTERNAL FIXATION (ORIF) DISTAL RADIUS FRACTURE;  Surgeon: Kathlynn Sharper, MD;  Location[facility];  Service: Orthopedics;  Laterality: Right;   OPEN REDUCTION INTERNAL FIXATION (ORIF) DISTAL RADIAL FRACTURE Left 08/04/2022   Procedure: OPEN REDUCTION INTERNAL FIXATION (ORIF) DISTAL RADIUS FRACTURE;  Surgeon: Kathlynn Sharper, MD;  Location: [facility];  Service: Orthopedics;  Laterality: Left;   SENTINEL NODE BIOPSY Right 11/07/2016   Procedure: SENTINEL NODE BIOPSY;  Surgeon: Dessa Reyes ORN, MD;  Location: [facility];  Service: General;  Laterality: Right;   SIMPLE MASTECTOMY WITH AXILLARY SENTINEL NODE BIOPSY Right 11/07/2016   6 mm ER/PR 100%; Her 2 neu not overexpressed, T1b, N0.  Surgeon: Dessa Reyes ORN, MD;  Location: [facility];  Service: General;  Laterality: Right;   TEE WITHOUT CARDIOVERSION N/A 01/27/2021   Procedure: TRANSESOPHAGEAL ECHOCARDIOGRAM (TEE);  Surgeon: Cindie Sandra DASEN, MD;  Location: [facility] INVASIVE CV LAB;  Service: Cardiovascular;  Laterality: N/A;   TEE WITHOUT CARDIOVERSION N/A 03/10/2021   Procedure: TRANSESOPHAGEAL ECHOCARDIOGRAM (TEE);  Surgeon: Loni Soyla LABOR, MD;  Location: [facility] ENDOSCOPY;  Service: Cardiology;  Laterality: N/A;   TONSILLECTOMY  AGE 11   UPPER GI ENDOSCOPY  01/12/06   hiatus hernia    Current Medications: Current Meds  Medication Sig   acetaminophen  (TYLENOL ) 325 MG tablet Take 325 mg by mouth every 6 (six) hours as needed for headache or fever.   ALPRAZolam  (XANAX ) 0.25 MG tablet Take 1 tablet (0.25 mg total) by mouth 2 (two) times daily as needed for anxiety.  (Patient taking differently: Take 0.25 mg by mouth at bedtime.)   amiodarone  (PACERONE ) 200 MG tablet Take 1 tablet (200 mg total) by mouth daily.   amLODipine  (NORVASC ) 2.5 MG tablet Take 1 tablet (2.5 mg total) by mouth daily.   atorvastatin  (LIPITOR) 40 MG tablet Take 1 tablet (40 mg total) by mouth daily.   b complex vitamins capsule Take 1 capsule by mouth daily.   brimonidine  (ALPHAGAN ) 0.2 % ophthalmic solution Place 1 drop into both eyes 2 (two) times daily.   calcium  carbonate (CALCIUM  600) 600 MG TABS tablet Take 600 mg by mouth daily with breakfast.  for bone health   Cholecalciferol  (VITAMIN D3) 125 MCG (5000 UT) TABS Take 5,000 Units by mouth daily.   docusate sodium  (COLACE) 100 MG capsule Take 100 mg by mouth daily.   ferrous sulfate 325 (65 FE) MG tablet Take 325 mg by mouth daily with breakfast.   latanoprost  (XALATAN ) 0.005 % ophthalmic solution Place 1 drop into both eyes at bedtime.   levothyroxine  (SYNTHROID ) 100 MCG tablet TAKE 1 TABLET BY MOUTH 30  MINUTES BEFORE BREAKFAST   losartan  (COZAAR ) 100 MG tablet Take 1 tablet (100 mg total) by mouth daily.   Multiple Vitamins-Minerals (CENTRUM SILVER 50+WOMEN) TABS Take 1 tablet by mouth daily.   Multiple Vitamins-Minerals (PRESERVISION AREDS 2) CAPS Take 1 capsule by mouth 2 (two) times daily.   omeprazole  (PRILOSEC) 40 MG capsule TAKE 1 CAPSULE BY MOUTH DAILY   potassium chloride  (KLOR-CON ) 20 MEQ packet DISSOLVE 2 PACKETS IN 4 OZ WATER OR JUICE AND DRINK DAILY   Probiotic Product (PROBIOTIC PO) Take 1 tablet by mouth daily. 3 billion live cultures   sertraline  (ZOLOFT ) 100 MG tablet Take 100 mg by mouth daily.    Allergies:   Patient has no known allergies.   Social History   Socioeconomic History   Marital status: Married    Spouse name: Kayla   Number of children: 2   Years of education: Not on file   Highest education level: Some college, no degree  Occupational History   Occupation: retired  Tobacco Use    Smoking status: Never   Smokeless tobacco: Never   Tobacco comments:    Never smoked 03/10/24  Vaping Use   Vaping status: Never  Used  Substance and Sexual Activity   Alcohol  use: Not Currently   Drug use: No   Sexual activity: Not Currently    Birth control/protection: None  Other Topics Concern   Not on file  Social History Narrative   Lives with Husband   Right handed   Drinks 2-3 cups caffiene daily   Social Drivers of Health   Financial Resource Strain: Low Risk  (05/18/2021)   Overall Financial Resource Strain (CARDIA)    Difficulty of Paying Living Expenses: Not hard at all  Food Insecurity: No Food Insecurity (11/27/2023)   Hunger Vital Sign    Worried About Running Out of Food in the Last Year: Never true    Ran Out of Food in the Last Year: Never true  Transportation Needs: No Transportation Needs (11/26/2023)   PRAPARE - Administrator, Civil Service (Medical): No    Lack of Transportation (Non-Medical): No  Recent Concern: Transportation Needs - Unmet Transportation Needs (09/10/2023)   PRAPARE - Transportation    Lack of Transportation (Medical): Yes    Lack of Transportation (Non-Medical): Yes  Physical Activity: Insufficiently Active (05/18/2021)   Exercise Vital Sign    Days of Exercise per Week: 3 days    Minutes of Exercise per Session: 30 min  Stress: No Stress Concern Present (05/18/2021)   [employer] of Occupational Health - Occupational Stress Questionnaire    Feeling of Stress : Only a little  Social Connections: Unknown (11/27/2023)   Social Connection and Isolation Panel    Frequency of Communication with Friends and Family: Not on file    Frequency of Social Gatherings with Friends and Family: Not on file    Attends Religious Services: Not on file    Active Member of Clubs or Organizations: Yes    Attends Banker Meetings: Not on file    Marital Status: Not on file     Family History:  The patient's family history  includes Alcohol  abuse in her father; Anemia in her mother; Breast cancer in her sister; Breast cancer (age of onset: 7) in her maternal grandmother; CAD in her paternal grandfather; Cancer in her mother and sister; Heart attack in her maternal grandfather; Other in her mother; Prostate cancer in her father; Stroke in her maternal grandmother.  ROS:   12-point review of systems is negative unless otherwise noted in the HPI.   EKGs/Labs/Other Studies Reviewed:    Studies reviewed were summarized above. The additional studies were reviewed today:  cMRI 09/26/2023: IMPRESSION: 1.  Normal LV size and systolic function.  LVEF 70%. 2.  Mild left ventricular hypertrophy. 3.  No LGE or scar. 4.  Normal RV function. 5.  No evidence for infiltrative disease. __________  2D echo 09/09/2023: 1. Left ventricular ejection fraction, by estimation, is 65 to 70%. The  left ventricle has normal function. The left ventricle has no regional  wall motion abnormalities. There is mild left ventricular hypertrophy.  Left ventricular diastolic parameters  are indeterminate.   2. Right ventricular systolic function is normal. The right ventricular  size is normal. There is moderately elevated pulmonary artery systolic  pressure. The estimated right ventricular systolic pressure is 49.2 mmHg.   3. The mitral valve is degenerative. Mild to moderate mitral valve  regurgitation. No evidence of mitral stenosis.   4. Tricuspid valve regurgitation is mild to moderate.   5. The aortic valve is tricuspid. There is mild thickening of the aortic  valve. Aortic valve regurgitation  is not visualized. Aortic valve  sclerosis is present, with no evidence of aortic valve stenosis.   6. The inferior vena cava is normal in size with greater than 50%  respiratory variability, suggesting right atrial pressure of 3 mmHg.  __________  2D echo 02/21/2023: 1. Left ventricular ejection fraction, by estimation, is 60 to 65%. The   left ventricle has normal function. The left ventricle has no regional  wall motion abnormalities. Left ventricular diastolic parameters were  normal.   2. Right ventricular systolic function is normal. The right ventricular  size is normal. There is normal pulmonary artery systolic pressure. The  estimated right ventricular systolic pressure is 32.2 mmHg.   3. Left atrial size was mildly dilated.   4. The mitral valve is normal in structure. Moderate mitral valve  regurgitation. No evidence of mitral stenosis.   5. The aortic valve is tricuspid. Aortic valve regurgitation is not  visualized. No aortic stenosis is present.   6. The inferior vena cava is normal in size with greater than 50%  respiratory variability, suggesting right atrial pressure of 3 mmHg.  __________  2D echo 04/22/2022: 1. Left ventricular ejection fraction, by estimation, is 60 to 65%. The  left ventricle has normal function. The left ventricle has no regional  wall motion abnormalities. There is mild left ventricular hypertrophy.  Left ventricular diastolic parameters  are indeterminate.   2. Right ventricular systolic function is normal. The right ventricular  size is normal.   3. Left atrial size was moderately dilated.   4. The mitral valve is normal in structure. Mild mitral valve  regurgitation.   5. The aortic valve is tricuspid. Aortic valve regurgitation is not  visualized.   6. The inferior vena cava is normal in size with greater than 50%  respiratory variability, suggesting right atrial pressure of 3 mmHg.  __________  TEE 03/10/2021:  1. 24 mm Watchman FLX left atrial appendage occluder well seated, no  residual flow. . Left atrial size was moderately dilated. No left  atrial/left atrial appendage thrombus was detected.   2. Left ventricular ejection fraction, by estimation, is 60 to 65%. The  left ventricle has normal function.   3. Right ventricular systolic function is normal. The right  ventricular  size is normal.   4. Right atrial size was moderately dilated.   5. The mitral valve is grossly normal.   6. The aortic valve is normal in structure. Aortic valve regurgitation is  trivial.   7. There is mild (Grade II) atheroma plaque involving the transverse  aorta.   8. Evidence of atrial level shunting detected by color flow Doppler.  There is a small iatrogenic mid septal atrial septal defect with  predominantly left to right shunting across the atrial septum. __________   TEE 01/27/2021: 1. Left ventricular ejection fraction, by estimation, is 60 to 65%. The  left ventricle has normal function. The left ventricle has no regional  wall motion abnormalities.   2. Right ventricular systolic function is normal. The right ventricular  size is normal.   3. Well-seated Watchman FLX 24 mm device, without peri-device leak. There  is a tiny mobile filamentous structure, maximum length 3 mm, diameter< 0.5  mm attached to the tip of the coumadin ridge that was seen throughout  the procedure, unchanged at the  end of the procedure. There is a small iatrogenic ASD with exclusively  left-to-right shunt after transseptal puncture. No left atrial/left atrial  appendage thrombus was detected.  4. The mitral valve is normal in structure. Mild mitral valve  regurgitation. No evidence of mitral stenosis.   5. The aortic valve is tricuspid. Aortic valve regurgitation is trivial.  Mild aortic valve sclerosis is present, with no evidence of aortic valve  stenosis.   6. The inferior vena cava is normal in size with greater than 50%  respiratory variability, suggesting right atrial pressure of 3 mmHg.   Conclusion(s)/Recommendation(s): TEE and 3D imaging with reconstruction  was used throughout the procedure to guide transseptal puncture, device  deployment, assess final result and assess for periprocedural  complications. __________   2D echo 07/17/2020: 1. Left ventricular  ejection fraction, by estimation, is 65 to 70%. The  left ventricle has normal function. The left ventricle has no regional  wall motion abnormalities. There is moderate left ventricular hypertrophy.  Left ventricular diastolic  parameters are consistent with Grade II diastolic dysfunction  (pseudonormalization).   2. Right ventricular systolic function is normal. The right ventricular  size is normal.   3. Left atrial size was mildly dilated.   4. Right atrial size was mildly dilated.   5. The mitral valve is normal in structure. Mild mitral valve  regurgitation.   6. The aortic valve is normal in structure. Aortic valve regurgitation is  trivial. ___________   Zio patch 01/2020: The patient was monitored for 14 days. The predominant rhythm was sinus with an average rate of 65 bpm (range 50 to 105 bpm in sinus). Rare PACs and PVCs were observed. A single 8 beat run of nonsustained ventricular tachycardia was observed with a maximum rate of 162 bpm. Multiple episodes of paroxysmal atrial fibrillation occurred, lasting up to 1 hour, 25 minutes. Average ventricular rate was 130 bpm (range 93 to 170 bpm). Atrial fibrillation burden was less than 1%. Patient triggered events correspond to sinus rhythm, PACs, and paroxysmal atrial fibrillation.   Predominately sinus rhythm with paroxysmal atrial fibrillation (a-fib burden <1%).  Rare PACs and PVCs as well as single brief run of NSVT noted. __________   2D echo 09/12/2019: 1. Left ventricular ejection fraction, by estimation, is 60 to 65%. The  left ventricle has normal function. The left ventricle has no regional  wall motion abnormalities. There is mild left ventricular hypertrophy.  Left ventricular diastolic parameters  are consistent with Grade II diastolic dysfunction (pseudonormalization).   2. Right ventricular systolic function is normal. The right ventricular  size is normal. There is normal pulmonary artery systolic pressure.    3. Left atrial size was mildly dilated.   4. Mild to moderate mitral valve regurgitation. __________   Lexiscan  MPI 02/15/2017: Blood pressure demonstrated a hypertensive response to exercise with elevated BP at baseline. The study is normal. This is a low risk study. The left ventricular ejection fraction is hyperdynamic (>65%). Non diagnostic ECG due to abnormal baseline. ___________   2D echo 01/18/2017: - Left ventricle: The cavity size was normal. There was mild    concentric LVH with moderate hypertrophy of the septum, 1.65 cm.    Near cavity obliteration with systole. No significant outflow    tract gradient measured. Systolic function was normal. The    estimated ejection fraction was in the range of 60% to 65%. Wall    motion was normal; there were no regional wall motion    abnormalities. Features are consistent with a pseudonormal left    ventricular filling pattern, with concomitant abnormal relaxation    and increased filling pressure (grade 2 diastolic dysfunction).  -  Mitral valve: There was mild regurgitation.  - Left atrium: The atrium was mildly dilated.  - Right ventricle: Systolic function was normal.  - Pulmonary arteries: Systolic pressure was within the normal    range.   EKG:  EKG is ordered today.  The EKG ordered today demonstrates sinus bradycardia, 57 bpm, LVH with early repolarization abnormality   Recent Labs: 08/30/2023: ALT 16; TSH 4.450 09/08/2023: B Natriuretic Peptide 225.4 11/26/2023: Magnesium  2.0 03/10/2024: BUN 19; Creatinine, Ser 0.91; Hemoglobin 12.0; Platelets 289; Potassium 4.4; Sodium 139  Recent Lipid Panel    Component Value Date/Time   CHOL 138 06/22/2021 1108   TRIG 128 06/22/2021 1108   HDL 52 06/22/2021 1108   CHOLHDL 2.7 06/22/2021 1108   CHOLHDL 2.7 07/17/2020 0436   VLDL 19 07/17/2020 0436   LDLCALC 63 06/22/2021 1108    PHYSICAL EXAM:    VS:  BP (!) 140/60 (BP Location: Left Arm, Patient Position: Sitting, Cuff Size:  Normal)   Pulse (!) 57 Comment: 56 oximeter  Ht 5' 3 (1.6 m)   Wt 117 lb 3.2 oz (53.2 kg)   SpO2 96%   BMI 20.76 kg/m   BMI: Body mass index is 20.76 kg/m.  Physical Exam Vitals reviewed.  Constitutional:      Appearance: She is well-developed.  HENT:     Head: Normocephalic and atraumatic.  Eyes:     General:        Right eye: No discharge.        Left eye: No discharge.  Cardiovascular:     Rate and Rhythm: Normal rate and regular rhythm.     Pulses:          Posterior tibial pulses are 2+ on the right side and 2+ on the left side.     Heart sounds: S1 normal and S2 normal. Heart sounds not distant. No midsystolic click and no opening snap. Murmur heard.     Systolic murmur is present with a grade of 2/6 at the upper left sternal border.     No friction rub.  Pulmonary:     Effort: Pulmonary effort is normal. No respiratory distress.     Breath sounds: Normal breath sounds. No decreased breath sounds, wheezing, rhonchi or rales.  Musculoskeletal:     Cervical back: Normal range of motion.     Right lower leg: No edema.     Left lower leg: No edema.  Skin:    General: Skin is warm and dry.     Nails: There is no clubbing.  Neurological:     Mental Status: She is alert and oriented to person, place, and time.  Psychiatric:        Speech: Speech normal.        Behavior: Behavior normal.        Thought Content: Thought content normal.        Judgment: Judgment normal.     Wt Readings from Last 3 Encounters:  04/07/24 117 lb 3.2 oz (53.2 kg)  03/25/24 117 lb 9.6 oz (53.3 kg)  03/10/24 118 lb 12.8 oz (53.9 kg)     ASSESSMENT & PLAN:   HTN: Blood pressure is mildly elevated in the office this afternoon though improved from prior readings at home.  Headache has also resolved.  Suspect elevated BP readings are in the setting of increased stress surrounding the end-stage COPD and hospice care her husband is currently in.  Add amlodipine  2.5 mg daily with continuation of  titrated  dose of losartan  100 mg daily.  Avoid nondihydropyridine calcium  channel blocker or beta-blocker with underlying bradycardic rates.  Nonobstructive CAD: No symptoms suggestive of angina.  LDL 63 in 05/2021.  She remains on atorvastatin  40 mg.  Anticipate fasting lipid panel at her convenience and follow-up if not already obtained.  This was deferred at this time in an effort to minimize time away from her husband.  HFpEF: Euvolemic and well compensated.  Not requiring standing loop diuretic.  Avoid addition of SGLT2 inhibitor or MRA at this time in an effort to minimize lab follow-up.  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  Status post watchman.  She will continue apixaban  through 04/11/2024 followed by resumption of aspirin  thereafter under the direction of A-fib clinic.  Follow-up with EP/A-fib clinic as directed.     Disposition: F/u with Dr. Mady or an APP in 6 months, and EP as directed.    Medication Adjustments/Labs and Tests Ordered: Current medicines are reviewed at length with the patient today.  Concerns regarding medicines are outlined above. Medication changes, Labs and Tests ordered today are summarized above and listed in the Patient Instructions accessible in Encounters.   Signed, Bernardino Bring, PA-C 04/07/2024 3:30 PM     [facility] [address] [place], [place] [address] [phone]

## 2024-04-07 NOTE — Patient Instructions (Signed)
 Medication Instructions:  Your physician recommends the following medication changes.  INCREASE: Amlodipine  2.5 daily   *If you need a refill on your cardiac medications before your next appointment, please call your pharmacy*  Lab Work: None ordered at this time   Follow-Up: At [facility], you and your health needs are our priority.  As part of our continuing mission to provide you with exceptional heart care, our providers are all part of one team.  This team includes your primary Cardiologist (physician) and Advanced Practice Providers or APPs (Physician Assistants and Nurse Practitioners) who all work together to provide you with the care you need, when you need it.  Your next appointment:   6 month(s)  Provider:   You may see Lonni Hanson, MD or Bernardino Bring, PA-C

## 2024-04-08 LAB — CUP PACEART REMOTE DEVICE CHECK
Date Time Interrogation Session: 20251109233313
Implantable Pulse Generator Implant Date: 20240522

## 2024-04-10 ENCOUNTER — Other Ambulatory Visit: Payer: Self-pay | Admitting: Emergency Medicine

## 2024-04-10 ENCOUNTER — Other Ambulatory Visit: Payer: Self-pay

## 2024-04-10 DIAGNOSIS — Z79899 Other long term (current) drug therapy: Secondary | ICD-10-CM

## 2024-04-10 NOTE — Progress Notes (Signed)
 Remote Loop Recorder Transmission

## 2024-04-11 ENCOUNTER — Other Ambulatory Visit (HOSPITAL_COMMUNITY): Payer: Self-pay

## 2024-04-11 LAB — BASIC METABOLIC PANEL WITH GFR
BUN/Creatinine Ratio: 25 (ref 12–28)
BUN: 17 mg/dL (ref 8–27)
CO2: 23 mmol/L (ref 20–29)
Calcium: 8.9 mg/dL (ref 8.7–10.3)
Chloride: 103 mmol/L (ref 96–106)
Creatinine, Ser: 0.68 mg/dL (ref 0.57–1.00)
Glucose: 99 mg/dL (ref 70–99)
Potassium: 4.1 mmol/L (ref 3.5–5.2)
Sodium: 140 mmol/L (ref 134–144)
eGFR: 89 mL/min/1.73 (ref >59)

## 2024-04-12 ENCOUNTER — Ambulatory Visit: Payer: Self-pay | Admitting: Cardiology

## 2024-04-14 ENCOUNTER — Ambulatory Visit: Payer: Self-pay | Admitting: Internal Medicine

## 2024-04-23 ENCOUNTER — Ambulatory Visit: Payer: Medicare (Managed Care) | Admitting: Cardiology

## 2024-05-05 ENCOUNTER — Encounter: Payer: Self-pay | Admitting: Cardiology

## 2024-05-10 ENCOUNTER — Ambulatory Visit: Payer: Medicare (Managed Care) | Attending: Cardiology

## 2024-05-10 DIAGNOSIS — I480 Paroxysmal atrial fibrillation: Secondary | ICD-10-CM | POA: Diagnosis not present

## 2024-05-12 LAB — CUP PACEART REMOTE DEVICE CHECK
Date Time Interrogation Session: 20251212232318
Implantable Pulse Generator Implant Date: 20240522

## 2024-05-16 ENCOUNTER — Ambulatory Visit: Payer: Self-pay | Admitting: Cardiology

## 2024-05-16 NOTE — Progress Notes (Signed)
 Remote Loop Recorder Transmission

## 2024-05-19 ENCOUNTER — Encounter: Payer: Self-pay | Admitting: Cardiology

## 2024-05-19 ENCOUNTER — Ambulatory Visit: Payer: Medicare (Managed Care) | Attending: Cardiology | Admitting: Cardiology

## 2024-05-19 VITALS — BP 98/60 | HR 108 | Ht 63.0 in | Wt 117.4 lb

## 2024-05-19 DIAGNOSIS — Z79899 Other long term (current) drug therapy: Secondary | ICD-10-CM | POA: Diagnosis not present

## 2024-05-19 DIAGNOSIS — I10 Essential (primary) hypertension: Secondary | ICD-10-CM

## 2024-05-19 DIAGNOSIS — Z5181 Encounter for therapeutic drug level monitoring: Secondary | ICD-10-CM

## 2024-05-19 DIAGNOSIS — I4819 Other persistent atrial fibrillation: Secondary | ICD-10-CM | POA: Diagnosis not present

## 2024-05-19 MED ORDER — AMIODARONE HCL 200 MG PO TABS: ORAL_TABLET | ORAL | 3 refills | Status: DC

## 2024-05-19 NOTE — Progress Notes (Signed)
" °  Electrophysiology Office Follow up Visit Note:    Date:  05/19/2024   ID:  Sandra [name], DOB 1946/05/13, MRN [id number]  PCP:  Sampson Ethridge LABOR, MD  [facility] Cardiologist:  Lonni Hanson, MD  [facility] Electrophysiologist:  OLE ONEIDA HOLTS, MD    Interval History:     Sandra [name] is a 78 y.o. female who presents for a follow up visit.   She last saw Elvie September 25, 2023.  She has a history of atrial fibrillation, tachycardia-bradycardia syndrome, left atrial appendage occlusion.  She has a loop recorder.  She is on amiodarone .  She saw Bernardino Bring April 07, 2024.  At that appointment she was maintaining sinus rhythm.  She is on aspirin .  Recent loop recorder interrogation showed no atrial fibrillation episodes.  Today she tells me her husband passed away approximately 1 month ago from complications of COPD and probable lung cancer.  This has been extremely stressful few weeks.  She feels like she has been out of rhythm over the last week or so.      Past medical, surgical, social and family history were reviewed.  ROS:   Please see the history of present illness.    All other systems reviewed and are negative.  EKGs/Labs/Other Studies Reviewed:    The following studies were reviewed today:     EKG Interpretation Date/Time:  Monday May 19 2024 13:20:27 EST Ventricular Rate:  108 PR Interval:    QRS Duration:  92 QT Interval:  292 QTC Calculation: 391 R Axis:   -5  Text Interpretation: Atrial fibrillation Confirmed by Holts Ole [phone]) on 05/19/2024 1:27:50 PM    Physical Exam:    VS:  BP 98/60 (BP Location: Left Arm, Patient Position: Sitting, Cuff Size: Normal)   Pulse (!) 108   Ht 5' 3 (1.6 m)   Wt 117 lb 6 oz (53.2 kg)   SpO2 97%   BMI 20.79 kg/m     Wt Readings from Last 3 Encounters:  05/19/24 117 lb 6 oz (53.2 kg)  04/07/24 117 lb 3.2 oz (53.2 kg)  03/25/24 117 lb 9.6 oz (53.3 kg)     GEN: no  distress CARD: Irregularly irregular, No MRG RESP: No IWOB. CTAB.      ASSESSMENT:    1. Persistent atrial fibrillation (HCC)   2. Encounter for monitoring amiodarone  therapy   3. Essential hypertension    PLAN:    In order of problems listed above:  #Atrial fibrillation #Watchman device in situ #High risk medication monitoring-amiodarone  Paroxysmal.  Over the last few days they have been flurries of atrial fibrillation.  Prior to that, she had episodes in October. On aspirin  monotherapy Watchman is in place Given recent recurrence, favor increasing amiodarone  to 200 mg by mouth twice daily for 5 days followed by 200 mg by mouth daily thereafter  #Hypertension at goal today.  Recommend checking blood pressures 1-2 times per week at home and recording the values.  Recommend bringing these recordings to the primary care physician.  I discussed my upcoming departure from [facility] during today's clinic appointment.  The patient will continue to follow-up with one of my EP partners moving forward.  Follow-up 4 to 6 weeks with Elvie or Dr. Kennyth.  If remains in atrial fibrillation, favor cardioversion at that time.       Signed, Ole Holts, MD, [facility], [facility] 05/19/2024 1:36 PM    Electrophysiology [facility] "

## 2024-05-19 NOTE — Patient Instructions (Signed)
 Medication Instructions:  Your physician recommends the following medication changes.  TEMPORARY INCREASE: Amiodarone  200 mg twice daily for 5 days THEN reduce to 200 mg daily  *If you need a refill on your cardiac medications before your next appointment, please call your pharmacy*  Lab Work: None ordered at this time   Follow-Up: At [facility], you and your health needs are our priority.  As part of our continuing mission to provide you with exceptional heart care, our providers are all part of one team.  This team includes your primary Cardiologist (physician) and Advanced Practice Providers or APPs (Physician Assistants and Nurse Practitioners) who all work together to provide you with the care you need, when you need it.  Your next appointment:   6 week(s)  Provider:   You may see SIDRA KITTY, MD or one of the following Advanced Practice Providers on your designated Care Team:   Charlies Arthur, [place] Jodie Passey, PA-C Suzann Riddle, NP Daphne Barrack, NP Artist Pouch, PA-C

## 2024-05-30 ENCOUNTER — Other Ambulatory Visit: Payer: Self-pay | Admitting: Internal Medicine

## 2024-05-30 ENCOUNTER — Other Ambulatory Visit: Payer: Self-pay | Admitting: Cardiology

## 2024-06-02 ENCOUNTER — Other Ambulatory Visit: Payer: Self-pay

## 2024-06-02 ENCOUNTER — Telehealth: Payer: Self-pay | Admitting: Cardiology

## 2024-06-02 ENCOUNTER — Telehealth: Payer: Self-pay

## 2024-06-02 DIAGNOSIS — Z860100 Personal history of colon polyps, unspecified: Secondary | ICD-10-CM

## 2024-06-02 MED ORDER — NA SULFATE-K SULFATE-MG SULF 17.5-3.13-1.6 GM/177ML PO SOLN: 354.0000 mL | Freq: Once | ORAL | 0 refills | Status: AC

## 2024-06-02 NOTE — Telephone Encounter (Signed)
 Cardiac clearance and blood thinner hold request form was sent to her cardiologist. Awaiting on a response.

## 2024-06-02 NOTE — Telephone Encounter (Signed)
 Gastroenterology Pre-Procedure Review  Request Date: 09/08/2024 Requesting Physician: Dr. Jinny  PATIENT REVIEW QUESTIONS: The patient responded to the following health history questions as indicated:    1. Are you having any GI issues? no 2. Do you have a personal history of Polyps? yes (08/01/2019 Dr. Jinny Polyps) 3. Do you have a family history of Colon Cancer or Polyps? no 4. Diabetes Mellitus? no 5. Joint replacements in the past 12 months?no 6. Major health problems in the past 3 months?no 7. Any artificial heart valves, MVP, or defibrillator?no    MEDICATIONS & ALLERGIES:    Patient reports the following regarding taking any anticoagulation/antiplatelet therapy:   Plavix, Coumadin, Eliquis , Xarelto, Lovenox , Pradaxa, Brilinta, or Effient? no Aspirin ? yes (ASA 81 MG BID)  Patient confirms/reports the following medications:  Current Outpatient Medications  Medication Sig Dispense Refill   acetaminophen  (TYLENOL ) 325 MG tablet Take 325 mg by mouth every 6 (six) hours as needed for headache or fever.     ALPRAZolam  (XANAX ) 0.25 MG tablet Take 1 tablet (0.25 mg total) by mouth 2 (two) times daily as needed for anxiety. (Patient taking differently: Take 0.25 mg by mouth at bedtime.) 10 tablet 0   amiodarone  (PACERONE ) 200 MG tablet Take 1 tablet (200 mg total) by mouth 2 (two) times daily for 5 days, THEN 1 tablet (200 mg total) daily. 90 tablet 3   amLODipine  (NORVASC ) 2.5 MG tablet Take 1 tablet (2.5 mg total) by mouth daily. 90 tablet 3   atorvastatin  (LIPITOR) 40 MG tablet TAKE 1 TABLET DAILY 90 tablet 3   b complex vitamins capsule Take 1 capsule by mouth daily.     brimonidine  (ALPHAGAN ) 0.2 % ophthalmic solution Place 1 drop into both eyes 2 (two) times daily.     calcium  carbonate (CALCIUM  600) 600 MG TABS tablet Take 600 mg by mouth daily with breakfast.  for bone health     Cholecalciferol  (VITAMIN D3) 125 MCG (5000 UT) TABS Take 5,000 Units by mouth daily.     docusate  sodium (COLACE) 100 MG capsule Take 100 mg by mouth daily.     ferrous sulfate 325 (65 FE) MG tablet Take 325 mg by mouth daily with breakfast.     latanoprost  (XALATAN ) 0.005 % ophthalmic solution Place 1 drop into both eyes at bedtime.  5   levothyroxine  (SYNTHROID ) 100 MCG tablet TAKE 1 TABLET BY MOUTH 30  MINUTES BEFORE BREAKFAST 90 tablet 3   losartan  (COZAAR ) 100 MG tablet Take 1 tablet (100 mg total) by mouth daily. 90 tablet 3   Multiple Vitamins-Minerals (CENTRUM SILVER 50+WOMEN) TABS Take 1 tablet by mouth daily.     Multiple Vitamins-Minerals (PRESERVISION AREDS 2) CAPS Take 1 capsule by mouth 2 (two) times daily.     omeprazole  (PRILOSEC) 40 MG capsule TAKE 1 CAPSULE BY MOUTH DAILY 90 capsule 3   potassium chloride  (KLOR-CON ) 20 MEQ packet DISSOLVE 2 PACKETS IN 4 OZ WATER OR JUICE AND DRINK DAILY 200 each 3   Probiotic Product (PROBIOTIC PO) Take 1 tablet by mouth daily. 3 billion live cultures     sertraline  (ZOLOFT ) 100 MG tablet Take 100 mg by mouth daily.     No current facility-administered medications for this visit.    Patient confirms/reports the following allergies:  Allergies[1]  No orders of the defined types were placed in this encounter.   AUTHORIZATION INFORMATION Primary Insurance: 1D#: Group #:  Secondary Insurance: 1D#: Group #:  SCHEDULE INFORMATION: Date: 09/08/2024 Time: Location: [facility] Dr.  Wohl     [1] No Known Allergies

## 2024-06-02 NOTE — Telephone Encounter (Signed)
 Left message for the pt to call back to confirm if she is taking Eliquis . It is not listed on her med list, however it is in her outside medication reconciliation. Will confirm with pt before adding to her med list.

## 2024-06-02 NOTE — Telephone Encounter (Signed)
 On review of patient's chart and medication list in Epic, she is not on Eliquis . Please confirm.  KL

## 2024-06-02 NOTE — Telephone Encounter (Signed)
"  ° °  Pre-operative Risk Assessment    Patient Name: Sandra [name]  DOB: [birth year] MRN: [id number]   Date of last office visit: 05/19/24 Date of next office visit: 07/02/24   Request for Surgical Clearance    Procedure:  COLONSCOPY gery:  Clearance 09/08/24                                 Surgeon:  NOT LISTED Surgeon's Group or Practice Name:  [facility] Phone number:  [phone] Fax number:  [phone] ATTN MARITZA, CMA   Type of Clearance Requested:   - Pharmacy:  Hold Apixaban  (Eliquis ) PRIOR AND AFTER   Type of Anesthesia:  General    Additional requests/questions:    Signed, Samule LITTIE Bristol   06/02/2024, 12:14 PM   "

## 2024-06-03 NOTE — Telephone Encounter (Addendum)
 See notes from preop APP Rosabel Mose, NP pt has appt 07/02/24 with Suzann Riddle, NP. Per preop APP defer clearance to appt with NP 07/02/24.   PROVIDER FOR PROCEDURE IS DR. DARREN WOHL

## 2024-06-03 NOTE — Telephone Encounter (Signed)
 Pt stated she is not taking Eliquis . She was advised to stop Eliuis 30 days after DCCV. Please advise. Thank you

## 2024-06-03 NOTE — Telephone Encounter (Signed)
" ° °  Name: Sandra [name]  DOB: 06-Aug-1945  MRN: [id number]  Primary Cardiologist: Lonni Hanson, MD  Chart reviewed as part of pre-operative protocol coverage. The patient has an upcoming visit scheduled with Suzann Riddle, NP on 07/02/24 at which time clearance can be addressed in case there are any issues that would impact surgical recommendations.  Colonoscopy is not scheduled until 09/08/24 as below. I added preop FYI to appointment note so that provider is aware to address at time of outpatient visit.  Per office protocol the cardiology provider should forward their finalized clearance decision and recommendations regarding antiplatelet therapy to the requesting party below.    Per notes patient is not on Eliquis  at this time however at office visit with Dr. Cindie on 12/22 she was noted to be back in atrial fibrillation, has watchman in place.  She is to be seen on follow-up on 2/4 to see if she remains in atrial fibrillation and may possibly need cardioversion and restarting of anticoagulation at that time, will defer to office appointment to see if needed.  I will route this message as FYI to requesting party and remove this message from the preop box as separate preop APP input not needed at this time.   Please call with any questions.  Donicia Druck D Jerral Mccauley, NP  06/03/2024, 3:21 PM   "

## 2024-06-10 ENCOUNTER — Ambulatory Visit: Payer: Medicare (Managed Care)

## 2024-06-10 DIAGNOSIS — I480 Paroxysmal atrial fibrillation: Secondary | ICD-10-CM | POA: Diagnosis not present

## 2024-06-10 LAB — CUP PACEART REMOTE DEVICE CHECK
Date Time Interrogation Session: 20260112232625
Implantable Pulse Generator Implant Date: 20240522

## 2024-06-13 NOTE — Progress Notes (Signed)
 Remote Loop Recorder Transmission

## 2024-06-15 ENCOUNTER — Ambulatory Visit: Payer: Self-pay | Admitting: Cardiology

## 2024-06-17 NOTE — Telephone Encounter (Signed)
 Patient has an appointment on 07/04/2024 for a cardiac clearance pre-op.

## 2024-06-30 ENCOUNTER — Telehealth: Payer: Self-pay | Admitting: Internal Medicine

## 2024-06-30 MED ORDER — AMIODARONE HCL 200 MG PO TABS: 200.0000 mg | ORAL_TABLET | Freq: Every day | ORAL | 3 refills | Status: AC

## 2024-06-30 MED ORDER — AMLODIPINE BESYLATE 2.5 MG PO TABS: 2.5000 mg | ORAL_TABLET | Freq: Every day | ORAL | 3 refills | Status: AC

## 2024-06-30 NOTE — Telephone Encounter (Signed)
REFILL'S SENT  

## 2024-07-02 ENCOUNTER — Ambulatory Visit: Payer: Medicare (Managed Care) | Admitting: Cardiology

## 2024-07-11 ENCOUNTER — Ambulatory Visit: Payer: Medicare (Managed Care)

## 2024-07-21 ENCOUNTER — Ambulatory Visit: Admitting: Cardiology

## 2024-08-11 ENCOUNTER — Ambulatory Visit: Payer: Medicare (Managed Care)

## 2024-09-08 ENCOUNTER — Ambulatory Visit: Admit: 2024-09-08 | Payer: Self-pay | Admitting: Gastroenterology

## 2024-09-08 SURGERY — COLONOSCOPY
Anesthesia: General

## 2024-10-07 ENCOUNTER — Ambulatory Visit: Payer: Medicare (Managed Care) | Admitting: Physician Assistant
